# Patient Record
Sex: Male | Born: 1935
Health system: Southern US, Community
[De-identification: ages and names within clinical notes are randomized; demographics above are authoritative.]

## PROBLEM LIST (undated history)

## (undated) DIAGNOSIS — Z5111 Encounter for antineoplastic chemotherapy: Secondary | ICD-10-CM

## (undated) DIAGNOSIS — C61 Malignant neoplasm of prostate: Secondary | ICD-10-CM

## (undated) DIAGNOSIS — I639 Cerebral infarction, unspecified: Secondary | ICD-10-CM

## (undated) DIAGNOSIS — T7840XA Allergy, unspecified, initial encounter: Secondary | ICD-10-CM

## (undated) DIAGNOSIS — F419 Anxiety disorder, unspecified: Secondary | ICD-10-CM

## (undated) DIAGNOSIS — C4490 Unspecified malignant neoplasm of skin, unspecified: Secondary | ICD-10-CM

## (undated) DIAGNOSIS — C9000 Multiple myeloma not having achieved remission: Secondary | ICD-10-CM

## (undated) DIAGNOSIS — C419 Malignant neoplasm of bone and articular cartilage, unspecified: Secondary | ICD-10-CM

## (undated) DIAGNOSIS — H9190 Unspecified hearing loss, unspecified ear: Secondary | ICD-10-CM

## (undated) DIAGNOSIS — Z923 Personal history of irradiation: Secondary | ICD-10-CM

## (undated) DIAGNOSIS — C439 Malignant melanoma of skin, unspecified: Secondary | ICD-10-CM

## (undated) DIAGNOSIS — IMO0002 Reserved for concepts with insufficient information to code with codable children: Secondary | ICD-10-CM

## (undated) DIAGNOSIS — E079 Disorder of thyroid, unspecified: Secondary | ICD-10-CM

## (undated) HISTORY — PX: HERNIA REPAIR: SHX51

## (undated) HISTORY — DX: Encounter for antineoplastic chemotherapy: Z51.11

## (undated) HISTORY — DX: Cerebral infarction, unspecified: I63.9

## (undated) HISTORY — DX: Unspecified hearing loss, unspecified ear: H91.90

---

## 1992-08-04 DIAGNOSIS — C4490 Unspecified malignant neoplasm of skin, unspecified: Secondary | ICD-10-CM

## 1992-08-04 HISTORY — DX: Unspecified malignant neoplasm of skin, unspecified: C44.90

## 2000-08-04 DIAGNOSIS — C61 Malignant neoplasm of prostate: Secondary | ICD-10-CM

## 2000-08-04 HISTORY — DX: Malignant neoplasm of prostate: C61

## 2011-11-05 DIAGNOSIS — Z0000 Encounter for general adult medical examination without abnormal findings: Secondary | ICD-10-CM | POA: Diagnosis not present

## 2011-11-05 DIAGNOSIS — C61 Malignant neoplasm of prostate: Secondary | ICD-10-CM | POA: Diagnosis not present

## 2011-11-05 DIAGNOSIS — C439 Malignant melanoma of skin, unspecified: Secondary | ICD-10-CM | POA: Diagnosis not present

## 2011-11-05 DIAGNOSIS — E039 Hypothyroidism, unspecified: Secondary | ICD-10-CM | POA: Diagnosis not present

## 2011-11-12 DIAGNOSIS — E039 Hypothyroidism, unspecified: Secondary | ICD-10-CM | POA: Diagnosis not present

## 2011-11-12 DIAGNOSIS — D696 Thrombocytopenia, unspecified: Secondary | ICD-10-CM | POA: Diagnosis not present

## 2011-11-12 DIAGNOSIS — C61 Malignant neoplasm of prostate: Secondary | ICD-10-CM | POA: Diagnosis not present

## 2011-11-12 DIAGNOSIS — C439 Malignant melanoma of skin, unspecified: Secondary | ICD-10-CM | POA: Diagnosis not present

## 2011-11-27 DIAGNOSIS — L570 Actinic keratosis: Secondary | ICD-10-CM | POA: Diagnosis not present

## 2011-11-27 DIAGNOSIS — B079 Viral wart, unspecified: Secondary | ICD-10-CM | POA: Diagnosis not present

## 2012-01-05 DIAGNOSIS — L821 Other seborrheic keratosis: Secondary | ICD-10-CM | POA: Diagnosis not present

## 2012-01-05 DIAGNOSIS — L578 Other skin changes due to chronic exposure to nonionizing radiation: Secondary | ICD-10-CM | POA: Diagnosis not present

## 2012-01-05 DIAGNOSIS — L570 Actinic keratosis: Secondary | ICD-10-CM | POA: Diagnosis not present

## 2012-07-12 DIAGNOSIS — L719 Rosacea, unspecified: Secondary | ICD-10-CM | POA: Diagnosis not present

## 2012-07-12 DIAGNOSIS — L578 Other skin changes due to chronic exposure to nonionizing radiation: Secondary | ICD-10-CM | POA: Diagnosis not present

## 2012-07-12 DIAGNOSIS — L570 Actinic keratosis: Secondary | ICD-10-CM | POA: Diagnosis not present

## 2012-07-12 DIAGNOSIS — L219 Seborrheic dermatitis, unspecified: Secondary | ICD-10-CM | POA: Diagnosis not present

## 2012-07-14 DIAGNOSIS — Z8546 Personal history of malignant neoplasm of prostate: Secondary | ICD-10-CM | POA: Diagnosis not present

## 2012-07-14 DIAGNOSIS — R3129 Other microscopic hematuria: Secondary | ICD-10-CM | POA: Diagnosis not present

## 2012-07-14 DIAGNOSIS — C61 Malignant neoplasm of prostate: Secondary | ICD-10-CM | POA: Diagnosis not present

## 2012-11-02 DIAGNOSIS — H903 Sensorineural hearing loss, bilateral: Secondary | ICD-10-CM | POA: Diagnosis not present

## 2012-11-11 DIAGNOSIS — D696 Thrombocytopenia, unspecified: Secondary | ICD-10-CM | POA: Diagnosis not present

## 2012-11-11 DIAGNOSIS — C439 Malignant melanoma of skin, unspecified: Secondary | ICD-10-CM | POA: Diagnosis not present

## 2012-11-11 DIAGNOSIS — Z0000 Encounter for general adult medical examination without abnormal findings: Secondary | ICD-10-CM | POA: Diagnosis not present

## 2012-11-11 DIAGNOSIS — E039 Hypothyroidism, unspecified: Secondary | ICD-10-CM | POA: Diagnosis not present

## 2012-11-11 DIAGNOSIS — Z1331 Encounter for screening for depression: Secondary | ICD-10-CM | POA: Diagnosis not present

## 2012-11-11 DIAGNOSIS — C61 Malignant neoplasm of prostate: Secondary | ICD-10-CM | POA: Diagnosis not present

## 2012-12-30 DIAGNOSIS — H0230 Blepharochalasis unspecified eye, unspecified eyelid: Secondary | ICD-10-CM | POA: Diagnosis not present

## 2012-12-30 DIAGNOSIS — H04129 Dry eye syndrome of unspecified lacrimal gland: Secondary | ICD-10-CM | POA: Diagnosis not present

## 2012-12-30 DIAGNOSIS — H1045 Other chronic allergic conjunctivitis: Secondary | ICD-10-CM | POA: Diagnosis not present

## 2012-12-30 DIAGNOSIS — H2510 Age-related nuclear cataract, unspecified eye: Secondary | ICD-10-CM | POA: Diagnosis not present

## 2013-01-05 DIAGNOSIS — L578 Other skin changes due to chronic exposure to nonionizing radiation: Secondary | ICD-10-CM | POA: Diagnosis not present

## 2013-01-05 DIAGNOSIS — L723 Sebaceous cyst: Secondary | ICD-10-CM | POA: Diagnosis not present

## 2013-01-05 DIAGNOSIS — Z85828 Personal history of other malignant neoplasm of skin: Secondary | ICD-10-CM | POA: Diagnosis not present

## 2013-01-05 DIAGNOSIS — L219 Seborrheic dermatitis, unspecified: Secondary | ICD-10-CM | POA: Diagnosis not present

## 2013-01-05 DIAGNOSIS — L570 Actinic keratosis: Secondary | ICD-10-CM | POA: Diagnosis not present

## 2013-01-05 DIAGNOSIS — L719 Rosacea, unspecified: Secondary | ICD-10-CM | POA: Diagnosis not present

## 2013-01-05 DIAGNOSIS — L821 Other seborrheic keratosis: Secondary | ICD-10-CM | POA: Diagnosis not present

## 2013-01-05 DIAGNOSIS — L819 Disorder of pigmentation, unspecified: Secondary | ICD-10-CM | POA: Diagnosis not present

## 2013-03-23 DIAGNOSIS — M25569 Pain in unspecified knee: Secondary | ICD-10-CM | POA: Diagnosis not present

## 2013-03-23 DIAGNOSIS — M25519 Pain in unspecified shoulder: Secondary | ICD-10-CM | POA: Diagnosis not present

## 2013-06-15 DIAGNOSIS — Z23 Encounter for immunization: Secondary | ICD-10-CM | POA: Diagnosis not present

## 2013-07-14 DIAGNOSIS — N320 Bladder-neck obstruction: Secondary | ICD-10-CM | POA: Diagnosis not present

## 2013-07-14 DIAGNOSIS — Z8546 Personal history of malignant neoplasm of prostate: Secondary | ICD-10-CM | POA: Diagnosis not present

## 2013-07-14 DIAGNOSIS — R351 Nocturia: Secondary | ICD-10-CM | POA: Diagnosis not present

## 2013-08-12 DIAGNOSIS — M19019 Primary osteoarthritis, unspecified shoulder: Secondary | ICD-10-CM | POA: Diagnosis not present

## 2013-08-15 DIAGNOSIS — M25519 Pain in unspecified shoulder: Secondary | ICD-10-CM | POA: Diagnosis not present

## 2013-08-18 DIAGNOSIS — M25519 Pain in unspecified shoulder: Secondary | ICD-10-CM | POA: Diagnosis not present

## 2013-08-23 DIAGNOSIS — M25519 Pain in unspecified shoulder: Secondary | ICD-10-CM | POA: Diagnosis not present

## 2013-08-23 DIAGNOSIS — M19019 Primary osteoarthritis, unspecified shoulder: Secondary | ICD-10-CM | POA: Diagnosis not present

## 2013-08-25 DIAGNOSIS — M19019 Primary osteoarthritis, unspecified shoulder: Secondary | ICD-10-CM | POA: Diagnosis not present

## 2013-08-25 DIAGNOSIS — M25519 Pain in unspecified shoulder: Secondary | ICD-10-CM | POA: Diagnosis not present

## 2013-08-30 DIAGNOSIS — M19019 Primary osteoarthritis, unspecified shoulder: Secondary | ICD-10-CM | POA: Diagnosis not present

## 2013-08-30 DIAGNOSIS — M25519 Pain in unspecified shoulder: Secondary | ICD-10-CM | POA: Diagnosis not present

## 2013-09-01 DIAGNOSIS — M25519 Pain in unspecified shoulder: Secondary | ICD-10-CM | POA: Diagnosis not present

## 2013-10-05 DIAGNOSIS — M25519 Pain in unspecified shoulder: Secondary | ICD-10-CM | POA: Diagnosis not present

## 2013-10-05 DIAGNOSIS — M19019 Primary osteoarthritis, unspecified shoulder: Secondary | ICD-10-CM | POA: Diagnosis not present

## 2013-10-11 DIAGNOSIS — M25519 Pain in unspecified shoulder: Secondary | ICD-10-CM | POA: Diagnosis not present

## 2013-10-13 DIAGNOSIS — M25519 Pain in unspecified shoulder: Secondary | ICD-10-CM | POA: Diagnosis not present

## 2013-10-18 DIAGNOSIS — M25519 Pain in unspecified shoulder: Secondary | ICD-10-CM | POA: Diagnosis not present

## 2013-10-20 DIAGNOSIS — M25519 Pain in unspecified shoulder: Secondary | ICD-10-CM | POA: Diagnosis not present

## 2013-10-25 DIAGNOSIS — M25519 Pain in unspecified shoulder: Secondary | ICD-10-CM | POA: Diagnosis not present

## 2013-10-27 DIAGNOSIS — M25519 Pain in unspecified shoulder: Secondary | ICD-10-CM | POA: Diagnosis not present

## 2013-11-01 DIAGNOSIS — M25519 Pain in unspecified shoulder: Secondary | ICD-10-CM | POA: Diagnosis not present

## 2013-11-03 DIAGNOSIS — M25519 Pain in unspecified shoulder: Secondary | ICD-10-CM | POA: Diagnosis not present

## 2013-11-08 DIAGNOSIS — M19019 Primary osteoarthritis, unspecified shoulder: Secondary | ICD-10-CM | POA: Diagnosis not present

## 2013-11-08 DIAGNOSIS — M25519 Pain in unspecified shoulder: Secondary | ICD-10-CM | POA: Diagnosis not present

## 2013-11-14 DIAGNOSIS — Z0000 Encounter for general adult medical examination without abnormal findings: Secondary | ICD-10-CM | POA: Diagnosis not present

## 2013-11-14 DIAGNOSIS — M1990 Unspecified osteoarthritis, unspecified site: Secondary | ICD-10-CM | POA: Diagnosis not present

## 2013-11-14 DIAGNOSIS — D696 Thrombocytopenia, unspecified: Secondary | ICD-10-CM | POA: Diagnosis not present

## 2013-11-14 DIAGNOSIS — E039 Hypothyroidism, unspecified: Secondary | ICD-10-CM | POA: Diagnosis not present

## 2013-11-14 DIAGNOSIS — Z8546 Personal history of malignant neoplasm of prostate: Secondary | ICD-10-CM | POA: Diagnosis not present

## 2013-11-14 DIAGNOSIS — C439 Malignant melanoma of skin, unspecified: Secondary | ICD-10-CM | POA: Diagnosis not present

## 2013-11-14 DIAGNOSIS — Z1331 Encounter for screening for depression: Secondary | ICD-10-CM | POA: Diagnosis not present

## 2013-11-14 DIAGNOSIS — Z23 Encounter for immunization: Secondary | ICD-10-CM | POA: Diagnosis not present

## 2014-01-02 ENCOUNTER — Ambulatory Visit
Admission: RE | Admit: 2014-01-02 | Discharge: 2014-01-02 | Disposition: A | Discharge status: Home / Self Care | Payer: Medicare Other | Source: Ambulatory Visit | Attending: Internal Medicine | Admitting: Internal Medicine

## 2014-01-02 ENCOUNTER — Other Ambulatory Visit: Payer: Self-pay | Admitting: Internal Medicine

## 2014-01-02 DIAGNOSIS — R079 Chest pain, unspecified: Secondary | ICD-10-CM | POA: Diagnosis not present

## 2014-01-10 DIAGNOSIS — L570 Actinic keratosis: Secondary | ICD-10-CM | POA: Diagnosis not present

## 2014-01-10 DIAGNOSIS — L259 Unspecified contact dermatitis, unspecified cause: Secondary | ICD-10-CM | POA: Diagnosis not present

## 2014-01-10 DIAGNOSIS — T148 Other injury of unspecified body region: Secondary | ICD-10-CM | POA: Diagnosis not present

## 2014-01-10 DIAGNOSIS — Z85828 Personal history of other malignant neoplasm of skin: Secondary | ICD-10-CM | POA: Diagnosis not present

## 2014-01-10 DIAGNOSIS — W57XXXA Bitten or stung by nonvenomous insect and other nonvenomous arthropods, initial encounter: Secondary | ICD-10-CM | POA: Diagnosis not present

## 2014-01-10 DIAGNOSIS — L723 Sebaceous cyst: Secondary | ICD-10-CM | POA: Diagnosis not present

## 2014-01-10 DIAGNOSIS — L821 Other seborrheic keratosis: Secondary | ICD-10-CM | POA: Diagnosis not present

## 2014-01-27 DIAGNOSIS — H903 Sensorineural hearing loss, bilateral: Secondary | ICD-10-CM | POA: Diagnosis not present

## 2014-01-31 ENCOUNTER — Telehealth (HOSPITAL_COMMUNITY): Payer: Self-pay

## 2014-01-31 ENCOUNTER — Other Ambulatory Visit (HOSPITAL_COMMUNITY): Payer: Self-pay | Admitting: Internal Medicine

## 2014-01-31 DIAGNOSIS — R079 Chest pain, unspecified: Secondary | ICD-10-CM

## 2014-02-02 ENCOUNTER — Ambulatory Visit (HOSPITAL_COMMUNITY)
Admission: RE | Admit: 2014-02-02 | Discharge: 2014-02-02 | Disposition: A | Discharge status: Home / Self Care | Payer: Medicare Other | Source: Ambulatory Visit | Attending: Internal Medicine | Admitting: Internal Medicine

## 2014-02-02 DIAGNOSIS — R079 Chest pain, unspecified: Secondary | ICD-10-CM | POA: Diagnosis not present

## 2014-02-02 NOTE — Procedures (Signed)
Exercise Treadmill Test   Test  Exercise Tolerance Test Ordering MD: Wenda Low, MD  Interpreting MD:   Unique Test No: 1  Treadmill:  1  Indication for ETT: chest pain - rule out ischemia  Contraindication to ETT: Yes   Stress Modality: exercise - treadmill  Cardiac Imaging Performed: non   Protocol: standard Bruce - maximal  Max BP:  169/83  Max MPHR (bpm): 138 85% MPR (bpm):  117  MPHR obtained (bpm):  122 % MPHR obtained:  88  Reached 85% MPHR (min:sec):  6:08 Total Exercise Time (min-sec): 6:15  Workload in METS:  7.30 Borg Scale:   Reason ETT Terminated:  fatigue    ST Segment Analysis At Rest: normal ST segments - no evidence of significant ST depression With Exercise: mild <1 mm upsloping ST depression  Other Information Arrhythmia:  No Angina during ETT:  absent (0) Quality of ETT:  diagnostic  ETT Interpretation:  normal - no evidence of ischemia by ST analysis  Comments: Fair exercise tolerance given age Normal BP response to exercise Mild, upsloping ST depression inferiorly <67mm No chest pain  Pixie Casino, MD, [facility] Attending Cardiologist [place]

## 2014-02-09 DIAGNOSIS — K219 Gastro-esophageal reflux disease without esophagitis: Secondary | ICD-10-CM | POA: Diagnosis not present

## 2014-02-09 DIAGNOSIS — R0789 Other chest pain: Secondary | ICD-10-CM | POA: Diagnosis not present

## 2014-02-16 NOTE — Telephone Encounter (Signed)
Encounter complete. 

## 2014-03-15 DIAGNOSIS — T148 Other injury of unspecified body region: Secondary | ICD-10-CM | POA: Diagnosis not present

## 2014-03-15 DIAGNOSIS — Z85828 Personal history of other malignant neoplasm of skin: Secondary | ICD-10-CM | POA: Diagnosis not present

## 2014-03-15 DIAGNOSIS — L570 Actinic keratosis: Secondary | ICD-10-CM | POA: Diagnosis not present

## 2014-05-04 DIAGNOSIS — Z23 Encounter for immunization: Secondary | ICD-10-CM | POA: Diagnosis not present

## 2014-05-22 ENCOUNTER — Other Ambulatory Visit: Payer: Self-pay | Admitting: Internal Medicine

## 2014-05-22 DIAGNOSIS — K219 Gastro-esophageal reflux disease without esophagitis: Secondary | ICD-10-CM

## 2014-05-22 DIAGNOSIS — Z1389 Encounter for screening for other disorder: Secondary | ICD-10-CM | POA: Diagnosis not present

## 2014-05-23 ENCOUNTER — Other Ambulatory Visit: Payer: Self-pay | Admitting: Internal Medicine

## 2014-05-23 ENCOUNTER — Ambulatory Visit
Admission: RE | Admit: 2014-05-23 | Discharge: 2014-05-23 | Disposition: A | Discharge status: Home / Self Care | Payer: Medicare Other | Source: Ambulatory Visit | Attending: Internal Medicine | Admitting: Internal Medicine

## 2014-05-23 DIAGNOSIS — K219 Gastro-esophageal reflux disease without esophagitis: Secondary | ICD-10-CM

## 2014-05-23 DIAGNOSIS — K449 Diaphragmatic hernia without obstruction or gangrene: Secondary | ICD-10-CM | POA: Diagnosis not present

## 2014-05-23 DIAGNOSIS — K5710 Diverticulosis of small intestine without perforation or abscess without bleeding: Secondary | ICD-10-CM | POA: Diagnosis not present

## 2014-06-03 DIAGNOSIS — M546 Pain in thoracic spine: Secondary | ICD-10-CM | POA: Diagnosis not present

## 2014-06-08 DIAGNOSIS — M546 Pain in thoracic spine: Secondary | ICD-10-CM | POA: Diagnosis not present

## 2014-06-13 DIAGNOSIS — M546 Pain in thoracic spine: Secondary | ICD-10-CM | POA: Diagnosis not present

## 2014-06-14 ENCOUNTER — Telehealth: Payer: Self-pay | Admitting: Internal Medicine

## 2014-06-14 NOTE — Telephone Encounter (Signed)
S/W PATIENT AND GAVE NP APPT FOR 11/12 @ 11 W/DR. MOHAMED.  REFERRING DR. MARK DUMONSKI DX- METS CANCER

## 2014-06-15 ENCOUNTER — Telehealth: Payer: Self-pay | Admitting: Internal Medicine

## 2014-06-15 ENCOUNTER — Ambulatory Visit (HOSPITAL_BASED_OUTPATIENT_CLINIC_OR_DEPARTMENT_OTHER): Payer: Medicare Other | Admitting: Internal Medicine

## 2014-06-15 ENCOUNTER — Other Ambulatory Visit: Payer: Self-pay | Admitting: *Deleted

## 2014-06-15 ENCOUNTER — Ambulatory Visit: Payer: Medicare Other

## 2014-06-15 ENCOUNTER — Other Ambulatory Visit (HOSPITAL_BASED_OUTPATIENT_CLINIC_OR_DEPARTMENT_OTHER): Payer: Medicare Other

## 2014-06-15 ENCOUNTER — Encounter: Payer: Self-pay | Admitting: Internal Medicine

## 2014-06-15 VITALS — BP 126/61 | HR 77 | Temp 98.3°F | Resp 19 | Ht 69.0 in | Wt 165.0 lb

## 2014-06-15 DIAGNOSIS — C419 Malignant neoplasm of bone and articular cartilage, unspecified: Secondary | ICD-10-CM

## 2014-06-15 DIAGNOSIS — Z85820 Personal history of malignant melanoma of skin: Secondary | ICD-10-CM

## 2014-06-15 DIAGNOSIS — C801 Malignant (primary) neoplasm, unspecified: Secondary | ICD-10-CM

## 2014-06-15 DIAGNOSIS — Z8546 Personal history of malignant neoplasm of prostate: Secondary | ICD-10-CM

## 2014-06-15 DIAGNOSIS — M899 Disorder of bone, unspecified: Secondary | ICD-10-CM | POA: Diagnosis not present

## 2014-06-15 DIAGNOSIS — C7951 Secondary malignant neoplasm of bone: Secondary | ICD-10-CM | POA: Diagnosis not present

## 2014-06-15 LAB — CBC WITH DIFFERENTIAL/PLATELET
BASO%: 0.3 % (ref 0.0–2.0)
BASOS ABS: 0 10*3/uL (ref 0.0–0.1)
EOS ABS: 0.1 10*3/uL (ref 0.0–0.5)
EOS%: 2 % (ref 0.0–7.0)
HCT: 38.7 % (ref 38.4–49.9)
HGB: 12.7 g/dL — ABNORMAL LOW (ref 13.0–17.1)
LYMPH#: 1.1 10*3/uL (ref 0.9–3.3)
LYMPH%: 20.8 % (ref 14.0–49.0)
MCH: 32.2 pg (ref 27.2–33.4)
MCHC: 32.8 g/dL (ref 32.0–36.0)
MCV: 98.2 fL — ABNORMAL HIGH (ref 79.3–98.0)
MONO#: 0.5 10*3/uL (ref 0.1–0.9)
MONO%: 9.6 % (ref 0.0–14.0)
NEUT#: 3.4 10*3/uL (ref 1.5–6.5)
NEUT%: 67.3 % (ref 39.0–75.0)
Platelets: 156 10*3/uL (ref 140–400)
RBC: 3.94 10*6/uL — ABNORMAL LOW (ref 4.20–5.82)
RDW: 13.2 % (ref 11.0–14.6)
WBC: 5.1 10*3/uL (ref 4.0–10.3)

## 2014-06-15 LAB — COMPREHENSIVE METABOLIC PANEL (CC13)
ALBUMIN: 3.7 g/dL (ref 3.5–5.0)
ALT: 15 U/L (ref 0–55)
ANION GAP: 3 meq/L (ref 3–11)
AST: 25 U/L (ref 5–34)
Alkaline Phosphatase: 64 U/L (ref 40–150)
BUN: 23.9 mg/dL (ref 7.0–26.0)
CALCIUM: 9.3 mg/dL (ref 8.4–10.4)
CO2: 28 meq/L (ref 22–29)
Chloride: 103 mEq/L (ref 98–109)
Creatinine: 1.1 mg/dL (ref 0.7–1.3)
Glucose: 107 mg/dl (ref 70–140)
POTASSIUM: 4.6 meq/L (ref 3.5–5.1)
Sodium: 134 mEq/L — ABNORMAL LOW (ref 136–145)
Total Bilirubin: 0.43 mg/dL (ref 0.20–1.20)
Total Protein: 8.2 g/dL (ref 6.4–8.3)

## 2014-06-15 LAB — LACTATE DEHYDROGENASE (CC13): LDH: 197 U/L (ref 125–245)

## 2014-06-15 NOTE — Progress Notes (Signed)
Checked in new patient with no issues. He has not traveled and has appt card.

## 2014-06-15 NOTE — Telephone Encounter (Signed)
gv and printed appt sched and avs for pt for NOV °

## 2014-06-16 LAB — PSA: PSA: <0.01 ng/mL (ref <=4.00)

## 2014-06-17 ENCOUNTER — Emergency Department (HOSPITAL_COMMUNITY): Payer: Medicare Other

## 2014-06-17 ENCOUNTER — Encounter (HOSPITAL_COMMUNITY)
Admission: EM | Disposition: A | Discharge status: Other Acute Inpatient Hospital | Payer: Self-pay | Source: Home / Self Care | Attending: Neurosurgery

## 2014-06-17 ENCOUNTER — Encounter: Payer: Self-pay | Admitting: Internal Medicine

## 2014-06-17 ENCOUNTER — Inpatient Hospital Stay (HOSPITAL_COMMUNITY): Payer: Medicare Other | Admitting: Anesthesiology

## 2014-06-17 ENCOUNTER — Inpatient Hospital Stay (HOSPITAL_COMMUNITY)
Admission: EM | Admit: 2014-06-17 | Discharge: 2014-06-20 | DRG: 457 | Disposition: A | Discharge status: Rehabilitation Facility | Payer: Medicare Other | Attending: Neurosurgery | Admitting: Neurosurgery

## 2014-06-17 ENCOUNTER — Encounter (HOSPITAL_COMMUNITY): Payer: Self-pay | Admitting: Emergency Medicine

## 2014-06-17 DIAGNOSIS — G9520 Unspecified cord compression: Secondary | ICD-10-CM | POA: Diagnosis not present

## 2014-06-17 DIAGNOSIS — G9529 Other cord compression: Secondary | ICD-10-CM | POA: Diagnosis present

## 2014-06-17 DIAGNOSIS — Z85820 Personal history of malignant melanoma of skin: Secondary | ICD-10-CM | POA: Diagnosis not present

## 2014-06-17 DIAGNOSIS — M549 Dorsalgia, unspecified: Secondary | ICD-10-CM | POA: Diagnosis present

## 2014-06-17 DIAGNOSIS — C7931 Secondary malignant neoplasm of brain: Secondary | ICD-10-CM | POA: Diagnosis not present

## 2014-06-17 DIAGNOSIS — R0602 Shortness of breath: Secondary | ICD-10-CM | POA: Diagnosis not present

## 2014-06-17 DIAGNOSIS — C61 Malignant neoplasm of prostate: Secondary | ICD-10-CM | POA: Diagnosis not present

## 2014-06-17 DIAGNOSIS — D62 Acute posthemorrhagic anemia: Secondary | ICD-10-CM

## 2014-06-17 DIAGNOSIS — M899 Disorder of bone, unspecified: Secondary | ICD-10-CM | POA: Diagnosis not present

## 2014-06-17 DIAGNOSIS — Z8546 Personal history of malignant neoplasm of prostate: Secondary | ICD-10-CM | POA: Diagnosis not present

## 2014-06-17 DIAGNOSIS — C7949 Secondary malignant neoplasm of other parts of nervous system: Secondary | ICD-10-CM | POA: Diagnosis not present

## 2014-06-17 DIAGNOSIS — R079 Chest pain, unspecified: Secondary | ICD-10-CM | POA: Diagnosis present

## 2014-06-17 DIAGNOSIS — D492 Neoplasm of unspecified behavior of bone, soft tissue, and skin: Secondary | ICD-10-CM | POA: Diagnosis not present

## 2014-06-17 DIAGNOSIS — C7951 Secondary malignant neoplasm of bone: Principal | ICD-10-CM

## 2014-06-17 DIAGNOSIS — J9811 Atelectasis: Secondary | ICD-10-CM | POA: Diagnosis not present

## 2014-06-17 DIAGNOSIS — R0789 Other chest pain: Secondary | ICD-10-CM | POA: Diagnosis not present

## 2014-06-17 DIAGNOSIS — C801 Malignant (primary) neoplasm, unspecified: Secondary | ICD-10-CM | POA: Diagnosis not present

## 2014-06-17 DIAGNOSIS — Z419 Encounter for procedure for purposes other than remedying health state, unspecified: Secondary | ICD-10-CM

## 2014-06-17 DIAGNOSIS — G8220 Paraplegia, unspecified: Secondary | ICD-10-CM

## 2014-06-17 DIAGNOSIS — C412 Malignant neoplasm of vertebral column: Secondary | ICD-10-CM | POA: Diagnosis not present

## 2014-06-17 DIAGNOSIS — M8448XA Pathological fracture, other site, initial encounter for fracture: Secondary | ICD-10-CM | POA: Diagnosis not present

## 2014-06-17 DIAGNOSIS — M47894 Other spondylosis, thoracic region: Secondary | ICD-10-CM | POA: Diagnosis not present

## 2014-06-17 HISTORY — DX: Malignant neoplasm of prostate: C61

## 2014-06-17 HISTORY — DX: Reserved for concepts with insufficient information to code with codable children: IMO0002

## 2014-06-17 HISTORY — DX: Malignant melanoma of skin, unspecified: C43.9

## 2014-06-17 HISTORY — PX: POSTERIOR CERVICAL FUSION/FORAMINOTOMY: SHX5038

## 2014-06-17 LAB — MRSA PCR SCREENING: MRSA by PCR: NEGATIVE

## 2014-06-17 LAB — CBC WITH DIFFERENTIAL/PLATELET
BASOS ABS: 0 10*3/uL (ref 0.0–0.1)
BASOS PCT: 0 % (ref 0–1)
EOS PCT: 1 % (ref 0–5)
Eosinophils Absolute: 0.1 10*3/uL (ref 0.0–0.7)
HEMATOCRIT: 40.5 % (ref 39.0–52.0)
Hemoglobin: 13.9 g/dL (ref 13.0–17.0)
Lymphocytes Relative: 24 % (ref 12–46)
Lymphs Abs: 1.4 10*3/uL (ref 0.7–4.0)
MCH: 33.1 pg (ref 26.0–34.0)
MCHC: 34.3 g/dL (ref 30.0–36.0)
MCV: 96.4 fL (ref 78.0–100.0)
MONO ABS: 0.5 10*3/uL (ref 0.1–1.0)
Monocytes Relative: 8 % (ref 3–12)
NEUTROS ABS: 3.8 10*3/uL (ref 1.7–7.7)
Neutrophils Relative %: 67 % (ref 43–77)
Platelets: 167 10*3/uL (ref 150–400)
RBC: 4.2 MIL/uL — ABNORMAL LOW (ref 4.22–5.81)
RDW: 13.4 % (ref 11.5–15.5)
WBC: 5.8 10*3/uL (ref 4.0–10.5)

## 2014-06-17 LAB — CBC
HCT: 38.1 % — ABNORMAL LOW (ref 39.0–52.0)
Hemoglobin: 13.1 g/dL (ref 13.0–17.0)
MCH: 33.1 pg (ref 26.0–34.0)
MCHC: 34.4 g/dL (ref 30.0–36.0)
MCV: 96.2 fL (ref 78.0–100.0)
PLATELETS: 148 10*3/uL — AB (ref 150–400)
RBC: 3.96 MIL/uL — ABNORMAL LOW (ref 4.22–5.81)
RDW: 13.2 % (ref 11.5–15.5)
WBC: 4.8 10*3/uL (ref 4.0–10.5)

## 2014-06-17 LAB — TROPONIN I

## 2014-06-17 LAB — BASIC METABOLIC PANEL
ANION GAP: 10 (ref 5–15)
BUN: 18 mg/dL (ref 6–23)
CO2: 25 mEq/L (ref 19–32)
Calcium: 9.2 mg/dL (ref 8.4–10.5)
Chloride: 96 mEq/L (ref 96–112)
Creatinine, Ser: 0.84 mg/dL (ref 0.50–1.35)
GFR calc Af Amer: >90 mL/min (ref >90)
GFR, EST NON AFRICAN AMERICAN: 79 mL/min — AB (ref >90)
Glucose, Bld: 122 mg/dL — ABNORMAL HIGH (ref 70–99)
POTASSIUM: 4 meq/L (ref 3.7–5.3)
SODIUM: 131 meq/L — AB (ref 137–147)

## 2014-06-17 LAB — COMPREHENSIVE METABOLIC PANEL
ALBUMIN: 4 g/dL (ref 3.5–5.2)
ALT: 23 U/L (ref 0–53)
AST: 36 U/L (ref 0–37)
Alkaline Phosphatase: 73 U/L (ref 39–117)
Anion gap: 14 (ref 5–15)
BUN: 22 mg/dL (ref 6–23)
CALCIUM: 9.8 mg/dL (ref 8.4–10.5)
CO2: 25 mEq/L (ref 19–32)
CREATININE: 1.01 mg/dL (ref 0.50–1.35)
Chloride: 100 mEq/L (ref 96–112)
GFR calc Af Amer: 78 mL/min — ABNORMAL LOW (ref >90)
GFR calc non Af Amer: 67 mL/min — ABNORMAL LOW (ref >90)
Glucose, Bld: 113 mg/dL — ABNORMAL HIGH (ref 70–99)
Potassium: 4.8 mEq/L (ref 3.7–5.3)
Sodium: 139 mEq/L (ref 137–147)
Total Bilirubin: 0.4 mg/dL (ref 0.3–1.2)
Total Protein: 9.5 g/dL — ABNORMAL HIGH (ref 6.0–8.3)

## 2014-06-17 LAB — PROTIME-INR
INR: 1.15 (ref 0.00–1.49)
PROTHROMBIN TIME: 14.8 s (ref 11.6–15.2)

## 2014-06-17 LAB — APTT: aPTT: 32 seconds (ref 24–37)

## 2014-06-17 LAB — D-DIMER, QUANTITATIVE (NOT AT ARMC): D DIMER QUANT: 2.74 ug{FEU}/mL — AB (ref 0.00–0.48)

## 2014-06-17 SURGERY — POSTERIOR CERVICAL FUSION/FORAMINOTOMY LEVEL 4
Anesthesia: General

## 2014-06-17 SURGERY — POSTERIOR CERVICAL FUSION/FORAMINOTOMY LEVEL 3
Anesthesia: General | Site: Back

## 2014-06-17 MED ORDER — ONDANSETRON HCL 4 MG PO TABS: 4.0000 mg | ORAL_TABLET | Freq: Four times a day (QID) | ORAL | Status: DC | PRN

## 2014-06-17 MED ORDER — LIDOCAINE HCL (CARDIAC) 20 MG/ML IV SOLN
INTRAVENOUS | Status: AC
  Filled 2014-06-17: qty 5

## 2014-06-17 MED ORDER — LIDOCAINE HCL (CARDIAC) 20 MG/ML IV SOLN
INTRAVENOUS | Status: DC | PRN
  Administered 2014-06-17: 70 mg via INTRAVENOUS

## 2014-06-17 MED ORDER — SODIUM CHLORIDE 0.9 % IV SOLN
INTRAVENOUS | Status: DC | PRN
  Administered 2014-06-17: 10 mL via INTRAMUSCULAR
  Administered 2014-06-18: 8 mL via INTRAMUSCULAR

## 2014-06-17 MED ORDER — PROPOFOL 10 MG/ML IV BOLUS
INTRAVENOUS | Status: DC | PRN
  Administered 2014-06-17: 100 mg via INTRAVENOUS

## 2014-06-17 MED ORDER — THROMBIN 5000 UNITS EX SOLR
OROMUCOSAL | Status: DC | PRN
  Administered 2014-06-17 – 2014-06-18 (×2): 5 mL via TOPICAL

## 2014-06-17 MED ORDER — ACETAMINOPHEN 650 MG RE SUPP: 650.0000 mg | Freq: Four times a day (QID) | RECTAL | Status: DC | PRN

## 2014-06-17 MED ORDER — MORPHINE SULFATE 4 MG/ML IJ SOLN
6.0000 mg | Freq: Once | INTRAMUSCULAR | Status: AC
  Administered 2014-06-17: 6 mg via INTRAVENOUS
  Filled 2014-06-17: qty 2

## 2014-06-17 MED ORDER — METRONIDAZOLE 0.75 % EX CREA
TOPICAL_CREAM | CUTANEOUS | Status: DC | PRN
  Filled 2014-06-17: qty 45

## 2014-06-17 MED ORDER — LACTATED RINGERS IV SOLN
INTRAVENOUS | Status: DC | PRN
  Administered 2014-06-17 – 2014-06-18 (×2): via INTRAVENOUS

## 2014-06-17 MED ORDER — DEXAMETHASONE 4 MG PO TABS: 6.0000 mg | ORAL_TABLET | Freq: Four times a day (QID) | ORAL | Status: DC

## 2014-06-17 MED ORDER — NEOSTIGMINE METHYLSULFATE 10 MG/10ML IV SOLN
INTRAVENOUS | Status: AC
  Filled 2014-06-17: qty 1

## 2014-06-17 MED ORDER — FENTANYL CITRATE 0.05 MG/ML IJ SOLN
INTRAMUSCULAR | Status: AC
  Filled 2014-06-17: qty 5

## 2014-06-17 MED ORDER — FAMOTIDINE 20 MG PO TABS
20.0000 mg | ORAL_TABLET | Freq: Every day | ORAL | Status: DC
  Administered 2014-06-18 – 2014-06-19 (×2): 20 mg via ORAL
  Filled 2014-06-17 (×4): qty 1

## 2014-06-17 MED ORDER — ACETAMINOPHEN 325 MG PO TABS: 650.0000 mg | ORAL_TABLET | Freq: Four times a day (QID) | ORAL | Status: DC | PRN

## 2014-06-17 MED ORDER — ARTIFICIAL TEARS OP OINT
TOPICAL_OINTMENT | OPHTHALMIC | Status: AC
  Filled 2014-06-17: qty 3.5

## 2014-06-17 MED ORDER — VITAMIN D3 25 MCG (1000 UNIT) PO TABS
1000.0000 [IU] | ORAL_TABLET | Freq: Every day | ORAL | Status: DC
  Administered 2014-06-18 – 2014-06-19 (×2): 1000 [IU] via ORAL
  Filled 2014-06-17 (×4): qty 1

## 2014-06-17 MED ORDER — DEXAMETHASONE SODIUM PHOSPHATE 10 MG/ML IJ SOLN
10.0000 mg | Freq: Once | INTRAMUSCULAR | Status: AC
  Administered 2014-06-17: 10 mg via INTRAVENOUS
  Filled 2014-06-17: qty 1

## 2014-06-17 MED ORDER — POLYETHYLENE GLYCOL 3350 17 G PO PACK
17.0000 g | PACK | Freq: Every day | ORAL | Status: DC | PRN
  Filled 2014-06-17: qty 1

## 2014-06-17 MED ORDER — OXYCODONE HCL 5 MG PO TABS
5.0000 mg | ORAL_TABLET | ORAL | Status: DC | PRN
Start: 2014-06-17 — End: 2014-06-18

## 2014-06-17 MED ORDER — PHENYLEPHRINE HCL 10 MG/ML IJ SOLN
10.0000 mg | INTRAVENOUS | Status: DC | PRN
  Administered 2014-06-17: 10 ug/min via INTRAVENOUS

## 2014-06-17 MED ORDER — PANTOPRAZOLE SODIUM 40 MG PO TBEC
80.0000 mg | DELAYED_RELEASE_TABLET | Freq: Every day | ORAL | Status: DC
  Administered 2014-06-18 – 2014-06-20 (×3): 80 mg via ORAL
  Filled 2014-06-17 (×3): qty 2

## 2014-06-17 MED ORDER — ONDANSETRON HCL 4 MG/2ML IJ SOLN
4.0000 mg | Freq: Four times a day (QID) | INTRAMUSCULAR | Status: DC | PRN
Start: 2014-06-17 — End: 2014-06-18

## 2014-06-17 MED ORDER — SODIUM CHLORIDE 0.9 % IJ SOLN
3.0000 mL | Freq: Two times a day (BID) | INTRAMUSCULAR | Status: DC
Start: 2014-06-17 — End: 2014-06-18

## 2014-06-17 MED ORDER — ONDANSETRON HCL 4 MG/2ML IJ SOLN
INTRAMUSCULAR | Status: AC
  Filled 2014-06-17: qty 2

## 2014-06-17 MED ORDER — IOHEXOL 350 MG/ML SOLN
100.0000 mL | Freq: Once | INTRAVENOUS | Status: AC | PRN
  Administered 2014-06-17: 100 mL via INTRAVENOUS

## 2014-06-17 MED ORDER — STERILE WATER FOR INJECTION IJ SOLN
INTRAMUSCULAR | Status: AC
  Filled 2014-06-17: qty 10

## 2014-06-17 MED ORDER — EPHEDRINE SULFATE 50 MG/ML IJ SOLN
INTRAMUSCULAR | Status: AC
  Filled 2014-06-17: qty 1

## 2014-06-17 MED ORDER — FENTANYL CITRATE 0.05 MG/ML IJ SOLN
INTRAMUSCULAR | Status: DC | PRN
  Administered 2014-06-17 (×3): 50 ug via INTRAVENOUS
  Administered 2014-06-17: 100 ug via INTRAVENOUS
  Administered 2014-06-17 (×2): 50 ug via INTRAVENOUS

## 2014-06-17 MED ORDER — HYDROMORPHONE HCL 1 MG/ML IJ SOLN
1.0000 mg | INTRAMUSCULAR | Status: DC | PRN
Start: 2014-06-17 — End: 2014-06-18

## 2014-06-17 MED ORDER — TAMSULOSIN HCL 0.4 MG PO CAPS
0.4000 mg | ORAL_CAPSULE | Freq: Every day | ORAL | Status: DC
  Administered 2014-06-18 – 2014-06-19 (×2): 0.4 mg via ORAL
  Filled 2014-06-17 (×4): qty 1

## 2014-06-17 MED ORDER — SENNA 8.6 MG PO TABS
1.0000 | ORAL_TABLET | Freq: Two times a day (BID) | ORAL | Status: DC
  Filled 2014-06-17 (×3): qty 1

## 2014-06-17 MED ORDER — METRONIDAZOLE 0.75 % EX CREA: 1.0000 "application " | TOPICAL_CREAM | CUTANEOUS | Status: DC

## 2014-06-17 MED ORDER — SODIUM CHLORIDE 0.9 % IV SOLN: 250.0000 mL | INTRAVENOUS | Status: DC | PRN

## 2014-06-17 MED ORDER — HYDROCORTISONE 2.5 % EX SOLN: 1.0000 "application " | Freq: Every day | CUTANEOUS | Status: DC | PRN

## 2014-06-17 MED ORDER — CARBOXYMETHYLCELLULOSE SODIUM 1 % OP SOLN: 1.0000 [drp] | OPHTHALMIC | Status: DC | PRN

## 2014-06-17 MED ORDER — ROCURONIUM BROMIDE 100 MG/10ML IV SOLN
INTRAVENOUS | Status: DC | PRN
  Administered 2014-06-17: 20 mg via INTRAVENOUS
  Administered 2014-06-17: 50 mg via INTRAVENOUS

## 2014-06-17 MED ORDER — POLYVINYL ALCOHOL 1.4 % OP SOLN
1.0000 [drp] | OPHTHALMIC | Status: DC | PRN
  Filled 2014-06-17: qty 15

## 2014-06-17 MED ORDER — THROMBIN 20000 UNITS EX SOLR
CUTANEOUS | Status: DC | PRN
  Administered 2014-06-17: 20 mL via TOPICAL

## 2014-06-17 MED ORDER — SODIUM CHLORIDE 0.9 % IV SOLN: Freq: Once | INTRAVENOUS | Status: DC

## 2014-06-17 MED ORDER — BISACODYL 10 MG RE SUPP: 10.0000 mg | Freq: Every day | RECTAL | Status: DC | PRN

## 2014-06-17 MED ORDER — CEFAZOLIN SODIUM-DEXTROSE 2-3 GM-% IV SOLR
INTRAVENOUS | Status: DC | PRN
  Administered 2014-06-17: 2 g via INTRAVENOUS

## 2014-06-17 MED ORDER — 0.9 % SODIUM CHLORIDE (POUR BTL) OPTIME
TOPICAL | Status: DC | PRN
  Administered 2014-06-17: 1000 mL

## 2014-06-17 MED ORDER — GLYCOPYRROLATE 0.2 MG/ML IJ SOLN
INTRAMUSCULAR | Status: AC
  Filled 2014-06-17: qty 4

## 2014-06-17 MED ORDER — LACTATED RINGERS IV SOLN
INTRAVENOUS | Status: DC | PRN
  Administered 2014-06-17: 22:00:00 via INTRAVENOUS

## 2014-06-17 MED ORDER — HYDROCORTISONE 1 % EX CREA
TOPICAL_CREAM | Freq: Every day | CUTANEOUS | Status: DC | PRN
  Filled 2014-06-17: qty 28

## 2014-06-17 MED ORDER — CEFAZOLIN SODIUM-DEXTROSE 2-3 GM-% IV SOLR
INTRAVENOUS | Status: AC
  Filled 2014-06-17: qty 50

## 2014-06-17 MED ORDER — PROPOFOL 10 MG/ML IV BOLUS
INTRAVENOUS | Status: AC
  Filled 2014-06-17: qty 20

## 2014-06-17 MED ORDER — SODIUM CHLORIDE 0.9 % IJ SOLN: 3.0000 mL | INTRAMUSCULAR | Status: DC | PRN

## 2014-06-17 MED ORDER — LEVOTHYROXINE SODIUM 25 MCG PO TABS
25.0000 ug | ORAL_TABLET | Freq: Every day | ORAL | Status: DC
  Administered 2014-06-18 – 2014-06-20 (×3): 25 ug via ORAL
  Filled 2014-06-17 (×5): qty 1

## 2014-06-17 MED ORDER — MAGNESIUM CITRATE PO SOLN: 1.0000 | Freq: Once | ORAL | Status: AC | PRN

## 2014-06-17 MED ORDER — POTASSIUM CHLORIDE IN NACL 20-0.9 MEQ/L-% IV SOLN
INTRAVENOUS | Status: DC
  Administered 2014-06-18: 06:00:00 via INTRAVENOUS
  Administered 2014-06-18: 80 mL/h via INTRAVENOUS
  Administered 2014-06-19 – 2014-06-20 (×2): via INTRAVENOUS
  Filled 2014-06-17 (×9): qty 1000

## 2014-06-17 MED ORDER — SUCCINYLCHOLINE CHLORIDE 20 MG/ML IJ SOLN
INTRAMUSCULAR | Status: AC
  Filled 2014-06-17: qty 1

## 2014-06-17 MED ORDER — ROCURONIUM BROMIDE 50 MG/5ML IV SOLN
INTRAVENOUS | Status: AC
  Filled 2014-06-17: qty 1

## 2014-06-17 SURGICAL SUPPLY — 66 items
BAG DECANTER FOR FLEXI CONT (MISCELLANEOUS) ×3 IMPLANT
BENZOIN TINCTURE PRP APPL 2/3 (GAUZE/BANDAGES/DRESSINGS) ×6 IMPLANT
BLADE CLIPPER SURG (BLADE) ×3 IMPLANT
BLADE ULTRA TIP 2M (BLADE) IMPLANT
BUR MATCHSTICK NEURO 3.0 LAGG (BURR) ×3 IMPLANT
BUR PRECISION FLUTE 5.0 (BURR) ×3 IMPLANT
CANISTER SUCT 3000ML (MISCELLANEOUS) ×3 IMPLANT
CAP LOCKING THREADED (Cap) ×15 IMPLANT
CLOSURE WOUND 1/2 X4 (GAUZE/BANDAGES/DRESSINGS)
CONNECTOR CROSSOVER 29-30MM (Connector) ×3 IMPLANT
CONT SPEC 4OZ CLIKSEAL STRL BL (MISCELLANEOUS) ×3 IMPLANT
DECANTER SPIKE VIAL GLASS SM (MISCELLANEOUS) ×3 IMPLANT
DRAPE C-ARM 42X72 X-RAY (DRAPES) ×6 IMPLANT
DRAPE C-ARMOR (DRAPES) ×3 IMPLANT
DRAPE LAPAROTOMY 100X72 PEDS (DRAPES) ×3 IMPLANT
DRAPE POUCH INSTRU U-SHP 10X18 (DRAPES) ×3 IMPLANT
DRSG OPSITE POSTOP 4X10 (GAUZE/BANDAGES/DRESSINGS) ×3 IMPLANT
DRSG TELFA 3X8 NADH (GAUZE/BANDAGES/DRESSINGS) IMPLANT
DURAPREP 6ML APPLICATOR 50/CS (WOUND CARE) ×3 IMPLANT
DURASEAL APPLICATOR TIP (TIP) ×3 IMPLANT
DURASEAL SPINE SEALANT 3ML (MISCELLANEOUS) ×3 IMPLANT
ELECT REM PT RETURN 9FT ADLT (ELECTROSURGICAL) ×3
ELECTRODE REM PT RTRN 9FT ADLT (ELECTROSURGICAL) ×1 IMPLANT
GAUZE SPONGE 4X4 12PLY STRL (GAUZE/BANDAGES/DRESSINGS) IMPLANT
GAUZE SPONGE 4X4 16PLY XRAY LF (GAUZE/BANDAGES/DRESSINGS) IMPLANT
GLOVE BIO SURGEON STRL SZ 6.5 (GLOVE) ×4 IMPLANT
GLOVE BIO SURGEON STRL SZ7 (GLOVE) ×9 IMPLANT
GLOVE BIO SURGEONS STRL SZ 6.5 (GLOVE) ×2
GLOVE ECLIPSE 6.5 STRL STRAW (GLOVE) ×6 IMPLANT
GLOVE EXAM NITRILE LRG STRL (GLOVE) IMPLANT
GLOVE EXAM NITRILE MD LF STRL (GLOVE) IMPLANT
GLOVE EXAM NITRILE XL STR (GLOVE) IMPLANT
GLOVE EXAM NITRILE XS STR PU (GLOVE) IMPLANT
GOWN STRL REUS W/ TWL LRG LVL3 (GOWN DISPOSABLE) ×3 IMPLANT
GOWN STRL REUS W/ TWL XL LVL3 (GOWN DISPOSABLE) IMPLANT
GOWN STRL REUS W/TWL 2XL LVL3 (GOWN DISPOSABLE) IMPLANT
GOWN STRL REUS W/TWL LRG LVL3 (GOWN DISPOSABLE) ×6
GOWN STRL REUS W/TWL XL LVL3 (GOWN DISPOSABLE)
KIT BASIN OR (CUSTOM PROCEDURE TRAY) ×3 IMPLANT
KIT ROOM TURNOVER OR (KITS) ×3 IMPLANT
NEEDLE HYPO 25X1 1.5 SAFETY (NEEDLE) ×3 IMPLANT
NS IRRIG 1000ML POUR BTL (IV SOLUTION) ×3 IMPLANT
PACK LAMINECTOMY NEURO (CUSTOM PROCEDURE TRAY) ×3 IMPLANT
PAD ARMBOARD 7.5X6 YLW CONV (MISCELLANEOUS) ×9 IMPLANT
PIN MAYFIELD SKULL DISP (PIN) ×3 IMPLANT
ROD 100MM (Rod) ×2 IMPLANT
ROD 85MM (Rod) ×3 IMPLANT
ROD SPNL STRG 100X5.5XNS (Rod) ×1 IMPLANT
SCREW 4.0X35MM (Screw) ×6 IMPLANT
SCREW 4.0X40MM (Screw) ×3 IMPLANT
SCREW CREO 4.5X40MM (Screw) ×6 IMPLANT
SPONGE LAP 4X18 X RAY DECT (DISPOSABLE) IMPLANT
SPONGE SURGIFOAM ABS GEL 100 (HEMOSTASIS) ×3 IMPLANT
STAPLER SKIN PROX WIDE 3.9 (STAPLE) ×3 IMPLANT
STRIP CLOSURE SKIN 1/2X4 (GAUZE/BANDAGES/DRESSINGS) IMPLANT
STRIP VITOSS 25X100X4MM (Neuro Prosthesis/Implant) ×3 IMPLANT
SUT ETHILON 3 0 FSL (SUTURE) ×3 IMPLANT
SUT VIC AB 0 CT1 18XCR BRD8 (SUTURE) ×4 IMPLANT
SUT VIC AB 0 CT1 8-18 (SUTURE) ×8
SUT VIC AB 2-0 CT1 18 (SUTURE) ×6 IMPLANT
SUT VIC AB 3-0 SH 8-18 (SUTURE) ×6 IMPLANT
SYR 20ML ECCENTRIC (SYRINGE) ×3 IMPLANT
TOWEL OR 17X24 6PK STRL BLUE (TOWEL DISPOSABLE) ×3 IMPLANT
TOWEL OR 17X26 10 PK STRL BLUE (TOWEL DISPOSABLE) ×3 IMPLANT
UNDERPAD 30X30 INCONTINENT (UNDERPADS AND DIAPERS) ×3 IMPLANT
WATER STERILE IRR 1000ML POUR (IV SOLUTION) ×3 IMPLANT

## 2014-06-17 SURGICAL SUPPLY — 49 items
BAG DECANTER FOR FLEXI CONT (MISCELLANEOUS) ×3 IMPLANT
BENZOIN TINCTURE PRP APPL 2/3 (GAUZE/BANDAGES/DRESSINGS) ×6 IMPLANT
BLADE CLIPPER SURG (BLADE) ×3 IMPLANT
BLADE ULTRA TIP 2M (BLADE) IMPLANT
BUR MATCHSTICK NEURO 3.0 LAGG (BURR) ×3 IMPLANT
CANISTER SUCT 3000ML (MISCELLANEOUS) ×3 IMPLANT
CLOSURE WOUND 1/2 X4 (GAUZE/BANDAGES/DRESSINGS)
CONT SPEC 4OZ CLIKSEAL STRL BL (MISCELLANEOUS) ×3 IMPLANT
DECANTER SPIKE VIAL GLASS SM (MISCELLANEOUS) ×3 IMPLANT
DRAPE C-ARM 42X72 X-RAY (DRAPES) ×6 IMPLANT
DRAPE LAPAROTOMY 100X72 PEDS (DRAPES) ×3 IMPLANT
DRAPE POUCH INSTRU U-SHP 10X18 (DRAPES) ×3 IMPLANT
DRSG TELFA 3X8 NADH (GAUZE/BANDAGES/DRESSINGS) IMPLANT
DURAPREP 6ML APPLICATOR 50/CS (WOUND CARE) ×3 IMPLANT
ELECT REM PT RETURN 9FT ADLT (ELECTROSURGICAL) ×3
ELECTRODE REM PT RTRN 9FT ADLT (ELECTROSURGICAL) ×1 IMPLANT
GAUZE SPONGE 4X4 12PLY STRL (GAUZE/BANDAGES/DRESSINGS) IMPLANT
GAUZE SPONGE 4X4 16PLY XRAY LF (GAUZE/BANDAGES/DRESSINGS) IMPLANT
GLOVE ECLIPSE 6.5 STRL STRAW (GLOVE) ×3 IMPLANT
GLOVE EXAM NITRILE LRG STRL (GLOVE) IMPLANT
GLOVE EXAM NITRILE MD LF STRL (GLOVE) IMPLANT
GLOVE EXAM NITRILE XL STR (GLOVE) IMPLANT
GLOVE EXAM NITRILE XS STR PU (GLOVE) IMPLANT
GOWN STRL REUS W/ TWL LRG LVL3 (GOWN DISPOSABLE) IMPLANT
GOWN STRL REUS W/ TWL XL LVL3 (GOWN DISPOSABLE) ×1 IMPLANT
GOWN STRL REUS W/TWL 2XL LVL3 (GOWN DISPOSABLE) IMPLANT
GOWN STRL REUS W/TWL LRG LVL3 (GOWN DISPOSABLE)
GOWN STRL REUS W/TWL XL LVL3 (GOWN DISPOSABLE) ×2
KIT BASIN OR (CUSTOM PROCEDURE TRAY) ×3 IMPLANT
KIT ROOM TURNOVER OR (KITS) ×3 IMPLANT
NEEDLE HYPO 25X1 1.5 SAFETY (NEEDLE) ×3 IMPLANT
NS IRRIG 1000ML POUR BTL (IV SOLUTION) ×3 IMPLANT
PACK LAMINECTOMY NEURO (CUSTOM PROCEDURE TRAY) ×3 IMPLANT
PAD ARMBOARD 7.5X6 YLW CONV (MISCELLANEOUS) ×9 IMPLANT
PIN MAYFIELD SKULL DISP (PIN) ×3 IMPLANT
SPONGE LAP 4X18 X RAY DECT (DISPOSABLE) IMPLANT
SPONGE SURGIFOAM ABS GEL 100 (HEMOSTASIS) ×3 IMPLANT
STAPLER SKIN PROX WIDE 3.9 (STAPLE) ×3 IMPLANT
STRIP CLOSURE SKIN 1/2X4 (GAUZE/BANDAGES/DRESSINGS) IMPLANT
SUT ETHILON 3 0 FSL (SUTURE) IMPLANT
SUT VIC AB 0 CT1 18XCR BRD8 (SUTURE) ×1 IMPLANT
SUT VIC AB 0 CT1 8-18 (SUTURE) ×2
SUT VIC AB 2-0 CT1 18 (SUTURE) ×3 IMPLANT
SUT VIC AB 3-0 SH 8-18 (SUTURE) ×3 IMPLANT
SYR 20ML ECCENTRIC (SYRINGE) ×3 IMPLANT
TOWEL OR 17X24 6PK STRL BLUE (TOWEL DISPOSABLE) ×3 IMPLANT
TOWEL OR 17X26 10 PK STRL BLUE (TOWEL DISPOSABLE) ×3 IMPLANT
UNDERPAD 30X30 INCONTINENT (UNDERPADS AND DIAPERS) ×3 IMPLANT
WATER STERILE IRR 1000ML POUR (IV SOLUTION) ×3 IMPLANT

## 2014-06-17 NOTE — Anesthesia Procedure Notes (Signed)
Procedure Name: Intubation Date/Time: 06/17/2014 10:13 PM Performed by: Lelon Perla A Pre-anesthesia Checklist: Patient identified, Suction available, Emergency Drugs available, Patient being monitored and Timeout performed Patient Re-evaluated:Patient Re-evaluated prior to inductionOxygen Delivery Method: Circle system utilized Preoxygenation: Pre-oxygenation with 100% oxygen Intubation Type: IV induction Ventilation: Mask ventilation without difficulty Laryngoscope Size: Miller and 2 Grade View: Grade I Tube type: Subglottic suction tube Tube size: 8.0 mm Number of attempts: 1 Airway Equipment and Method: Stylet Placement Confirmation: ETT inserted through vocal cords under direct vision,  positive ETCO2 and breath sounds checked- equal and bilateral Secured at: 23 cm Tube secured with: Tape Dental Injury: Teeth and Oropharynx as per pre-operative assessment

## 2014-06-17 NOTE — Progress Notes (Signed)
[facility] [phone]   [phone]  CONSULT NOTE  REFERRING PHYSICIAN: Dr. Phylliss Bob  REASON FOR CONSULTATION:  80 years old white male with questionable metastatic bone lesions.  HPI Jeremiah Owens is a 80 y.o. male with past medical history significant for multiple medical problems including history of prostate cancer diagnosed in 2002 with Gleason score of 7 (3+4) and PSA of 8. He is status post external beam radiation and seed implants performed in [place]. The patient also has a history of malignant melanoma status post wide excision in 1994. He was seen recently by Dr. Lynann Bologna complaining of back pain of several weeks' duration that was getting worse and limiting his mobility. He ordered MRI of the thoracic spine without contrast which was performed on 06/04/2014 and it showed findings compatible with metastatic disease. There was a pathologic compression fractures of T5 which is secondary to tumor. There was also complete effacement of the subarachnoid space. There was infiltrating tumor extends into the posterior elements and expands the pedicles greater on the left than right with displacement of the cord to the right. There was also metastatic disease at the other levels including C7, T2, T3, T6, T10 and T11. The patient was referred to me today for further evaluation and recommendation regarding these finding on his MRI. He is feeling fine except for the persistent back pain. He takes only tramadol for pain management. He denied having any weakness in his lower extremities. He denied having any incontinence to urine or bowel movement. He has no chest pain, shortness breath, cough or hemoptysis. The patient denied having any nausea or vomiting or rectal bleeding. He has no significant weight loss or night sweats.  PAST MEDICAL HISTORY: significant for history of prostate cancer in 2002, melanoma in 1994, GERD, hearing deficit,  osteoarthritis, vitamin D deficiency.  FAMILY HISTORY: Mother had breast cancer, father died at age 35  SOCIAL HISTORY: the patient is married and has one son. He was accompanied by his [name]. 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 He has no history of smoking but drinks glass of wine every night and no history of drug abuse.   HPI   Social History History  Substance Use Topics  . Smoking status: Not on file  . Smokeless tobacco: Not on file  . Alcohol Use: Not on file    No Known Allergies  Current Outpatient Prescriptions  Medication Sig Dispense Refill  . celecoxib (CELEBREX) 200 MG capsule Take 200 mg by mouth 2 (two) times daily.    . cholecalciferol (VITAMIN D) 1000 UNITS tablet Take 1,000 Units by mouth daily.    . Glucosamine-Chondroit-Vit C-Mn (GLUCOSAMINE 1500 COMPLEX PO) Take 1 tablet by mouth daily.    Marland Kitchen HYDROCORTISONE, TOPICAL, 2.5 % SOLN Apply topically.    Marland Kitchen levothyroxine (SYNTHROID, LEVOTHROID) 25 MCG tablet Take 25 mcg by mouth daily.  4  . metroNIDAZOLE (METROCREAM) 0.75 % cream Apply topically 2 (two) times daily.    . Multiple Vitamins-Minerals (CENTRUM SILVER PO) Take 1 tablet by mouth daily.    Marland Kitchen omeprazole (PRILOSEC) 40 MG capsule Take 40 mg by mouth daily.  11  . ranitidine (ZANTAC) 150 MG tablet Take 150 mg by mouth daily.  11  . tamsulosin (FLOMAX) 0.4 MG CAPS capsule Take 0.4 mg by mouth daily.  3   No current facility-administered medications for this visit.    Review of Systems  Constitutional:  negative Eyes: negative Ears, nose, mouth, throat, and face: negative Respiratory: negative Cardiovascular: negative Gastrointestinal: negative Genitourinary:negative Integument/breast: negative Hematologic/lymphatic: negative Musculoskeletal:positive for back pain Neurological: negative Behavioral/Psych: negative Endocrine: negative Allergic/Immunologic: negative  Physical Exam  BDZ:HGDJM, healthy, no distress, well  nourished and well developed SKIN: skin color, texture, turgor are normal, no rashes or significant lesions HEAD: Normocephalic, No masses, lesions, tenderness or abnormalities EYES: normal, PERRLA EARS: External ears normal, Canals clear OROPHARYNX:no exudate, no erythema and lips, buccal mucosa, and tongue normal  NECK: supple, no adenopathy, no JVD LYMPH:  no palpable lymphadenopathy, no hepatosplenomegaly LUNGS: clear to auscultation , and palpation HEART: regular rate & rhythm, no murmurs and no gallops ABDOMEN:abdomen soft, non-tender, normal bowel sounds and no masses or organomegaly BACK: Back symmetric, no curvature., No CVA tenderness EXTREMITIES:no joint deformities, effusion, or inflammation, no edema, no skin discoloration  NEURO: alert & oriented x 3 with fluent speech, no focal motor/sensory deficits  PERFORMANCE STATUS: ECOG 1  LABORATORY DATA: Lab Results  Component Value Date   WBC 5.1 06/15/2014   HGB 12.7* 06/15/2014   HCT 38.7 06/15/2014   MCV 98.2* 06/15/2014   PLT 156 06/15/2014      Chemistry      Component Value Date/Time   NA 134* 06/15/2014 1139   K 4.6 06/15/2014 1139   CO2 28 06/15/2014 1139   BUN 23.9 06/15/2014 1139   CREATININE 1.1 06/15/2014 1139      Component Value Date/Time   CALCIUM 9.3 06/15/2014 1139   ALKPHOS 64 06/15/2014 1139   AST 25 06/15/2014 1139   ALT 15 06/15/2014 1139   BILITOT 0.43 06/15/2014 1139       RADIOGRAPHIC STUDIES: Dg Ugi W/kub  05/23/2014   CLINICAL DATA:  Symptoms of reflux  EXAM: UPPER GI SERIES WITH KUB  TECHNIQUE: After obtaining a scout radiograph a routine upper GI series was performed using thin barium  FLUOROSCOPY TIME:  2 min 42 seconds  COMPARISON:  None.  FINDINGS: A preliminary film of the abdomen shows no bowel obstruction. There is a lumbar scoliosis convex to the right with degenerative change. No opaque calculi are noted.  A single contrast upper GI was performed. There is some prominence of  the cricopharyngeus muscle noted. However, for age peristalsis is within normal limits. There does appear to be a small hiatal hernia. No definite gastroesophageal reflux could be demonstrated however. A pill was given at the end of the study which did pass into the stomach without delay.  The stomach is normal in contour and peristalsis. The duodenal bulb fills and the duodenal loop is in normal position. There does appear to be a proximal jejunal diverticulum present.  IMPRESSION: 1. Small hiatal hernia. No gastroesophageal reflux could be demonstrated. Prominent cricopharyngeus muscle. Barium pill passes into the stomach without delay. 2. No abnormality of the stomach or duodenum. Proximal jejunal diverticulum. 3. Lumbar scoliosis with diffuse degenerative change in the lumbar spine.   Electronically Signed   By: Ivar Drape M.D.   On: 05/23/2014 10:53    ASSESSMENT: This is a very pleasant 80 years old white male with a remote history of prostate cancer as well as malignant melanoma who presented with multiple bone lesions suspicious for metastatic neoplasm of unknown primary at this point. The patient is currently neurologically intact but has persistent back pain.   PLAN: I had a lengthy discussion with the patient and his wife today about his current condition and treatment options. I recommended for  the patient to have a PET scan performed for further evaluation of his disease and to identify the primary etiology. I also recommended for the patient to start treatment with Decadron 6 mg by mouth every 6 hours. I would consider referring the patient to radiation oncology for evaluation and consideration of palliative radiotherapy to the T5 lesion which is concerning for cord compression. The patient was also advised to go immediately to the emergency department if he has any worsening of his pain or any neurological deficit. I would see him back for follow-up visit after the PET scan for further  evaluation of his condition and consideration of biopsy of the most accessible lesion. The patient voices understanding of current disease status and treatment options and is in agreement with the current care plan.  All questions were answered. The patient knows to call the clinic with any problems, questions or concerns. We can certainly see the patient much sooner if necessary.  Thank you so much for allowing me to participate in the care of Jeremiah Owens. I will continue to follow up the patient with you and assist in his care.  I spent 40 minutes counseling the patient face to face. The total time spent in the appointment was 60 minutes.  Disclaimer: This note was dictated with voice recognition software. Similar sounding words can inadvertently be transcribed and may not be corrected upon review.  Addendum: After reviewing the MRI images which were not available to me at the time of the visit, I called the patient and ask him to go immediately to the emergency department for further evaluation because of the concern about the cord compression at the T5 area. He may benefit from evaluation by neurosurgery and radiation oncology. I spoke to Dr. Lindi Adie and informed him about the case and he would see him once he gets to the hospital. [facility] K. 06/17/2014, 12:45 PM

## 2014-06-17 NOTE — Anesthesia Preprocedure Evaluation (Addendum)
Anesthesia Evaluation  Patient identified by MRN, date of birth, ID band Patient awake    Reviewed: Allergy & Precautions, H&P , NPO status , Patient's Chart, lab work & pertinent test results  Airway Mallampati: II   Neck ROM: full    Dental   Pulmonary neg pulmonary ROS,          Cardiovascular negative cardio ROS      Neuro/Psych Thoracic tumor  + mets    GI/Hepatic   Endo/Other    Renal/GU    H/o prostate CA    Musculoskeletal   Abdominal   Peds  Hematology   Anesthesia Other Findings   Reproductive/Obstetrics                            Anesthesia Physical Anesthesia Plan  ASA: II and emergent  Anesthesia Plan: General   Post-op Pain Management:    Induction: Intravenous  Airway Management Planned: Oral ETT  Additional Equipment:   Intra-op Plan:   Post-operative Plan: Extubation in OR  Informed Consent: I have reviewed the patients History and Physical, chart, labs and discussed the procedure including the risks, benefits and alternatives for the proposed anesthesia with the patient or authorized representative who has indicated his/her understanding and acceptance.     Plan Discussed with: CRNA, Anesthesiologist and Surgeon  Anesthesia Plan Comments:         Anesthesia Quick Evaluation

## 2014-06-17 NOTE — Progress Notes (Signed)
[facility] Progress Note Patient Name: Jeremiah Owens DOB: 1930/03/18 MRN: [id number]   Date of Service  06/17/2014  HPI/Events of Note   Called by ED for possible admission following spinal fixation by NSG. Per EDP no active medical issues. Plan is for biopsy tonight. No real indication for PCCM co-management for now.   eICU Interventions   Patient to remain on NSG service. If needs change following surgery NSG will re consult.      Intervention Category Minor Interventions: Communication with other healthcare providers and/or family  Jabrea Kallstrom R. 06/17/2014, 7:55 PM

## 2014-06-17 NOTE — Progress Notes (Signed)
Patient admitted to floor from [facility] via transport. Patient is oriented with expressed weakness in lower extremities.  Patient to go to OR. Interventions performed to expedite process.

## 2014-06-17 NOTE — H&P (Signed)
Jeremiah Owens is an 80 y.o. male.  Complaints of chest pain since this summer. Pain has been increasing since that time. MRI ordered by Dr. Lynann Bologna performed on November 5th revealed spinal cord compression at T5, pathologic fracture at T5, metastatic disease in the thoracic spine, without a known primary. History of  Neck melanoma treated in the 90's via excision, and prostate cancer treated in the early 2000's. Has otherwise been in generally good health.  Chief Complaint: Chest pain, Thoracic tumor HPI: as above  Past Medical History  Diagnosis Date  . Compression fracture   . Prostate cancer   . Melanoma     Past Surgical History  Procedure Laterality Date  . Hernia repair      History reviewed. No pertinent family history. Social History:  reports that he has never smoked. He does not have any smokeless tobacco history on file. He reports that he does not drink alcohol or use illicit drugs.  Allergies:  Allergies  Allergen Reactions  . Iodine Itching    Patient had a reaction 20 years ago and can't remember what kind of reaction he had.  Patient was given decadron in the ER and had an Angio ordered and did test with no problems.  Bottom line "patient needs to pre-medicated before any IV dye is given".     (Not in a hospital admission)  Results for orders placed or performed during the hospital encounter of 06/17/14 (from the past 48 hour(s))  CBC with Differential     Status: Abnormal   Collection Time: 06/17/14  4:38 PM  Result Value Ref Range   WBC 5.8 4.0 - 10.5 K/uL   RBC 4.20 (L) 4.22 - 5.81 MIL/uL   Hemoglobin 13.9 13.0 - 17.0 g/dL   HCT 40.5 39.0 - 52.0 %   MCV 96.4 78.0 - 100.0 fL   MCH 33.1 26.0 - 34.0 pg   MCHC 34.3 30.0 - 36.0 g/dL   RDW 13.4 11.5 - 15.5 %   Platelets 167 150 - 400 K/uL   Neutrophils Relative % 67 43 - 77 %   Neutro Abs 3.8 1.7 - 7.7 K/uL   Lymphocytes Relative 24 12 - 46 %   Lymphs Abs 1.4 0.7 - 4.0 K/uL   Monocytes Relative 8 3 - 12 %    Monocytes Absolute 0.5 0.1 - 1.0 K/uL   Eosinophils Relative 1 0 - 5 %   Eosinophils Absolute 0.1 0.0 - 0.7 K/uL   Basophils Relative 0 0 - 1 %   Basophils Absolute 0.0 0.0 - 0.1 K/uL  Comprehensive metabolic panel     Status: Abnormal   Collection Time: 06/17/14  4:38 PM  Result Value Ref Range   Sodium 139 137 - 147 mEq/L   Potassium 4.8 3.7 - 5.3 mEq/L   Chloride 100 96 - 112 mEq/L   CO2 25 19 - 32 mEq/L   Glucose, Bld 113 (H) 70 - 99 mg/dL   BUN 22 6 - 23 mg/dL   Creatinine, Ser 1.01 0.50 - 1.35 mg/dL   Calcium 9.8 8.4 - 10.5 mg/dL   Total Protein 9.5 (H) 6.0 - 8.3 g/dL   Albumin 4.0 3.5 - 5.2 g/dL   AST 36 0 - 37 U/L    Comment: SLIGHT HEMOLYSIS HEMOLYSIS AT THIS LEVEL MAY AFFECT RESULT CORRECTED RESULTS CALLED TO: M HAMBY AT 1750 ON 11.14.2015 BY NBROOKS CORRECTED ON 11/14 AT 1746: PREVIOUSLY REPORTED AS 23 SLIGHT HEMOLYSIS HEMOLYSIS AT THIS LEVEL MAY AFFECT RESULT  ALT 23 0 - 53 U/L   Alkaline Phosphatase 73 39 - 117 U/L   Total Bilirubin 0.4 0.3 - 1.2 mg/dL   GFR calc non Af Amer 67 (L) >90 mL/min   GFR calc Af Amer 78 (L) >90 mL/min    Comment: (NOTE) The eGFR has been calculated using the CKD EPI equation. This calculation has not been validated in all clinical situations. eGFR's persistently <90 mL/min signify possible Chronic Kidney Disease.    Anion gap 14 5 - 15  Troponin I     Status: None   Collection Time: 06/17/14  4:38 PM  Result Value Ref Range   Troponin I <0.30 <0.30 ng/mL    Comment:        Due to the release kinetics of cTnI, a negative result within the first hours of the onset of symptoms does not rule out myocardial infarction with certainty. If myocardial infarction is still suspected, repeat the test at appropriate intervals.   D-dimer, quantitative     Status: Abnormal   Collection Time: 06/17/14  4:38 PM  Result Value Ref Range   D-Dimer, Quant 2.74 (H) 0.00 - 0.48 ug/mL-FEU    Comment:        AT THE INHOUSE ESTABLISHED  CUTOFF VALUE OF 0.48 ug/mL FEU, THIS ASSAY HAS BEEN DOCUMENTED IN THE LITERATURE TO HAVE A SENSITIVITY AND NEGATIVE PREDICTIVE VALUE OF AT LEAST 98 TO 99%.  THE TEST RESULT SHOULD BE CORRELATED WITH AN ASSESSMENT OF THE CLINICAL PROBABILITY OF DVT / VTE.    Dg Chest 2 View  06/17/2014   CLINICAL DATA:  Shortness of breath, upper chest pain  EXAM: CHEST  2 VIEW  COMPARISON:  01/02/2014  FINDINGS: Cardiomediastinal silhouette is stable. No acute infiltrate or pleural effusion. No pulmonary edema. Mild basilar atelectasis. Mild degenerative changes thoracic spine.  IMPRESSION: No acute infiltrate or pulmonary edema.  Mild basilar atelectasis.   Electronically Signed   By: Lahoma Crocker M.D.   On: 06/17/2014 18:04   Ct Angio Chest Pe W/cm &/or Wo Cm  06/17/2014   CLINICAL DATA:  Chest pain shortness of breath concern for pulmonary embolism initial evaluation ; Personal history of malignant melanoma, with metastatic disease currently involving thoracic spine  EXAM: CT ANGIOGRAPHY CHEST WITH CONTRAST  TECHNIQUE: Multidetector CT imaging of the chest was performed using the standard protocol during bolus administration of intravenous contrast. Multiplanar CT image reconstructions and MIPs were obtained to evaluate the vascular anatomy.  CONTRAST:  180m OMNIPAQUE IOHEXOL 350 MG/ML SOLN  COMPARISON:  06/17/2014, 06/08/2014  FINDINGS: Parietal filling defects in the pulmonary arterial system. Thoracic aorta shows no evidence of dissection or dilatation. Numerous small to borderline mediastinal lymph nodes and hilar lymph nodes, nonspecific. No significant pleural or pericardial effusion. Moderate bilateral dependent atelectasis. Known moderate T5 compression deformity with extensive lytic change consistent with metastasis. No other acute musculoskeletal findings.  Partially visualized low-attenuation lesion upper pole right kidney with average attenuation value of 0 most consistent with a partially visualized  cyst measuring between 2 and 3 cm.  Review of the MIP images confirms the above findings.  IMPRESSION: No evidence of pulmonary arterial embolism.  Known lytic metastatic lesion T5 vertebral body with compression deformity   Electronically Signed   By: RSkipper ClicheM.D.   On: 06/17/2014 19:16    Review of Systems  HENT: Negative.   Eyes: Negative.   Respiratory: Negative.   Cardiovascular: Positive for chest pain and leg swelling.  Gastrointestinal: Negative.  Genitourinary: Negative.   Musculoskeletal: Positive for back pain.  Skin: Negative.   Neurological: Positive for sensory change, focal weakness and weakness.  Endo/Heme/Allergies: Negative.   Psychiatric/Behavioral: Negative.     Blood pressure 152/75, pulse 81, temperature 97.7 F (36.5 C), temperature source Oral, resp. rate 18, SpO2 96 %. Physical Exam  Constitutional: He is oriented to person, place, and time. He appears well-developed and well-nourished. No distress.  HENT:  Head: Normocephalic and atraumatic.  Eyes: Conjunctivae and EOM are normal. Pupils are equal, round, and reactive to light.  Neck: Normal range of motion. Neck supple.  Cardiovascular: Normal rate, regular rhythm and normal heart sounds.   Respiratory: Effort normal and breath sounds normal.  GI: Soft.  Neurological: He is alert and oriented to person, place, and time. He has normal reflexes. He displays normal reflexes. No cranial nerve deficit. He exhibits normal muscle tone. Coordination and gait abnormal. He displays no Babinski's sign on the right side. He displays no Babinski's sign on the left side.  Proprioception severely diminished at great toe bilaterally, present at right ankle, diminished at left ankle Decreased light touch, and temperature left v. Right Normal strength on manual exam in lower extremities, and normal in upper extremities Voiding without difficulty  Skin: Skin is warm and dry.  Psychiatric: He has a normal mood and  affect. His behavior is normal. Judgment and thought content normal.     Assessment/Plan Emergent or for spinal stabilization, and decompression spinal cord. I have explained the risks and benefits to Mr. Borsuk. He understands that without surgery there is no reasonable expectation that he would improve, and also that his neurologic status would continue to deteriorate. I have spoken with Dr. Lisbeth Renshaw of Radiation Oncology and we both agree that stabilization, decompression, and pathologic diagnosis is better than radiating without known pathology. His pain is most likely due to mechanical instability.Mr.and Mrs. Laduca understand and agree with surgery.   , L 06/17/2014, 8:01 PM

## 2014-06-17 NOTE — Consult Note (Signed)
Radiation Oncology         [phone]) [phone] ________________________________  Name: Jeremiah Owens MRN: [id number]  Date: 06/17/2014  DOB: 10/04/1929   DIAGNOSIS: spine metastasis   HISTORY OF PRESENT ILLNESS::Jeremiah Owens is a 80 y.o. male who is seen for an initial consultation visit regarding the patient's diagnosis of metastatic disease involving the spine.  The patient has a history of prostate cancer diagnosed in 2002 for which he underwent radiation treatment. The patient has been in clinical remission regarding this since that time and his recent PSA level has been undetectable. He also has a history of excision of a melanoma from the right neck. No further treatment for this and again he has been clinically NED regarding this diagnosis.  The patient has been complaining of upper mid back pain as well as pain in the anterior chest bilaterally, greater on the left. He underwent evaluation for this which did not return with any significant cardiac issues according to the patient. He then subsequently had an MRI scan of the thoracic spine which showed multilevel metastatic disease within the spine. Most prominent is significant involvement and severe retropulsion of the T5 vertebral body in the setting of pathologic fracture. Cord compression was present at this level with the spinal cord shifted to the right with no visible CSF at that level.  The patient was subsequently seen by Dr. Earlie Server who did not have the scan initially. Upon review, the patient was instructed to present to the emergency room. He had been scheduled for a PET scan and consultation with radiation oncology and neurosurgery was planned.  The patient at this time states that he has experienced some lower extremity weakness for at least several weeks. This is been in the setting of worsening upper back and chest pain. He began using a walker and several days ago, at least by Thursday and possibly Wednesday, he was not able to  walk effectively even with the walker. The patient denies any significant numbness. No change in bowel or bladder function.    PREVIOUS RADIATION THERAPY: Yes the patient received external beam radiation treatment and prostate brachytherapy in [place] for his prostate cancer in 2002.   PAST MEDICAL HISTORY:  has a past medical history of Compression fracture; Prostate cancer; and Melanoma.     PAST SURGICAL HISTORY: Past Surgical History  Procedure Laterality Date  . Hernia repair       FAMILY HISTORY: family history is not on file.   SOCIAL HISTORY:  reports that he has never smoked. He does not have any smokeless tobacco history on file. He reports that he does not drink alcohol or use illicit drugs.   ALLERGIES: Iodine   MEDICATIONS:  No current facility-administered medications for this encounter.   Current Outpatient Prescriptions  Medication Sig Dispense Refill  . celecoxib (CELEBREX) 200 MG capsule Take 200 mg by mouth every other day.     . cholecalciferol (VITAMIN D) 1000 UNITS tablet Take 1,000 Units by mouth at bedtime.     . Glucosamine-Chondroit-Vit C-Mn (GLUCOSAMINE 1500 COMPLEX PO) Take 2 tablets by mouth daily after breakfast.     . HYDROcodone-acetaminophen (NORCO/VICODIN) 5-325 MG per tablet Take 1-2 tablets by mouth every 4 (four) hours as needed for moderate pain.    Marland Kitchen HYDROCORTISONE, TOPICAL, 2.5 % SOLN Apply 1 application topically daily as needed.     Marland Kitchen levothyroxine (SYNTHROID, LEVOTHROID) 25 MCG tablet Take 25 mcg by mouth daily.  4  . metroNIDAZOLE (METROCREAM) 0.75 % cream  Apply 1 application topically See admin instructions. Every time pt washes face, he put a small amount on his nose.    . Multiple Vitamins-Minerals (CENTRUM SILVER PO) Take 1 tablet by mouth daily.    Marland Kitchen omeprazole (PRILOSEC) 40 MG capsule Take 40 mg by mouth daily. In the afternoon  11  . Polyvinyl Alcohol-Povidone (REFRESH OP) Apply 1 drop to eye daily as needed (dry/irritated  eyes).    . ranitidine (ZANTAC) 150 MG tablet Take 150 mg by mouth at bedtime.   11  . tamsulosin (FLOMAX) 0.4 MG CAPS capsule Take 0.4 mg by mouth at bedtime.   3  . dexamethasone (DECADRON) 4 MG tablet Take 1.5 tablets (6 mg total) by mouth 4 (four) times daily. 60 tablet 0     REVIEW OF SYSTEMS:  A 15 point review of systems is documented in the electronic medical record. This was obtained by the nursing staff. However, I reviewed this with the patient to discuss relevant findings and make appropriate changes.  Pertinent items are noted in HPI.    PHYSICAL EXAM:  oral temperature is 97.7 F (36.5 C). His blood pressure is 152/75 and his pulse is 81. His respiration is 18 and oxygen saturation is 96%.   ECOG = 2  0 - Asymptomatic (Fully active, able to carry on all predisease activities without restriction)  1 - Symptomatic but completely ambulatory (Restricted in physically strenuous activity but ambulatory and able to carry out work of a light or sedentary nature. For example, light housework, office work)  2 - Symptomatic, <50% in bed during the day (Ambulatory and capable of all self care but unable to carry out any work activities. Up and about more than 50% of waking hours)  3 - Symptomatic, >50% in bed, but not bedbound (Capable of only limited self-care, confined to bed or chair 50% or more of waking hours)  4 - Bedbound (Completely disabled. Cannot carry on any self-care. Totally confined to bed or chair)  5 - Death   Eustace Pen MM, Creech RH, Tormey DC, et al. [id number]). "Toxicity and response criteria of the [facility]". [place] Oncol. 5 (6): 649-55  General: Well-developed, in no acute distress HEENT: Normocephalic, atraumatic; oral cavity clear Neck: Supple without any lymphadenopathy Cardiovascular: Regular rate and rhythm Respiratory: Clear to auscultation bilaterally GI: Soft, nontender, normal bowel sounds Extremities: some edema present in the  lower extremities bilaterally Neuro: 5/5 strength in the upper extremities bilaterally. The patient has reasonable strength on exam bilaterally in the lower extremities in terms of dorsiflexion, plantar flexion, and proximal strength with leg raise, slightly greater on the right but essentially 4/5 bilaterally     LABORATORY DATA:  Lab Results  Component Value Date   WBC 5.8 06/17/2014   HGB 13.9 06/17/2014   HCT 40.5 06/17/2014   MCV 96.4 06/17/2014   PLT 167 06/17/2014   Lab Results  Component Value Date   NA 139 06/17/2014   K 4.8 06/17/2014   CL 100 06/17/2014   CO2 25 06/17/2014   Lab Results  Component Value Date   ALT 23 06/17/2014   AST 36 06/17/2014   ALKPHOS 73 06/17/2014   BILITOT 0.4 06/17/2014      RADIOGRAPHY: Dg Chest 2 View  06/17/2014   CLINICAL DATA:  Shortness of breath, upper chest pain  EXAM: CHEST  2 VIEW  COMPARISON:  01/02/2014  FINDINGS: Cardiomediastinal silhouette is stable. No acute infiltrate or pleural effusion. No pulmonary  edema. Mild basilar atelectasis. Mild degenerative changes thoracic spine.  IMPRESSION: No acute infiltrate or pulmonary edema.  Mild basilar atelectasis.   Electronically Signed   By: Lahoma Crocker M.D.   On: 06/17/2014 18:04   Dg Duanne Limerick W/kub  05/23/2014   CLINICAL DATA:  Symptoms of reflux  EXAM: UPPER GI SERIES WITH KUB  TECHNIQUE: After obtaining a scout radiograph a routine upper GI series was performed using thin barium  FLUOROSCOPY TIME:  2 min 42 seconds  COMPARISON:  None.  FINDINGS: A preliminary film of the abdomen shows no bowel obstruction. There is a lumbar scoliosis convex to the right with degenerative change. No opaque calculi are noted.  A single contrast upper GI was performed. There is some prominence of the cricopharyngeus muscle noted. However, for age peristalsis is within normal limits. There does appear to be a small hiatal hernia. No definite gastroesophageal reflux could be demonstrated however. A pill was  given at the end of the study which did pass into the stomach without delay.  The stomach is normal in contour and peristalsis. The duodenal bulb fills and the duodenal loop is in normal position. There does appear to be a proximal jejunal diverticulum present.  IMPRESSION: 1. Small hiatal hernia. No gastroesophageal reflux could be demonstrated. Prominent cricopharyngeus muscle. Barium pill passes into the stomach without delay. 2. No abnormality of the stomach or duodenum. Proximal jejunal diverticulum. 3. Lumbar scoliosis with diffuse degenerative change in the lumbar spine.   Electronically Signed   By: Ivar Drape M.D.   On: 05/23/2014 10:53       IMPRESSION/PLAN:  The patient has a history remotely of prostate cancer and melanoma with no known active disease. Last PSA recently this past week was undetectable. He has apparent metastasis which is multifocal within the spine. He has a pathologic fracture with retropulsion and cord compression at T5 which likely accounts for the patient's worsening lower extremity strength. The patient has begun steroids in the emergency room. He has not taken any prior to today.  I have been asked to see the patient for consideration of urgent palliative radiation treatment to this area. Neurosurgery has also been consult and I have spoken with Dr.Cabell who is in the process of evaluating the patient. Given the extent of disease at this location and the timeframe of the patient's symptoms, I believe that surgery is a strong consideration. If Dr. Cyndy Freeze feels that surgery is appropriate, then the patient will receive postoperative radiation treatment to this area after some recovery. If surgery is not feasible, then emergent radiation treatment will be discussed with the patient. I have discussed with the patient already that we do not have a clear diagnosis at this time regarding these changes. This is 1 negative aspect of treating the patient emergently, but I believe  this is nevertheless a consideration given the rapid progression of the patient's symptoms and the fact that he does continue to have some reasonable residual strength on exam today.  -emergent radiation treatment versus initial surgery followed by postoperative radiation treatment -Continue Decadron -CT scan of abdomen/pelvis and MRI scan of remainder of the spine can be completed during the patient's hospital stay -Biopsy of metastatic site     ________________________________   Jodelle Gross, MD, PhD   **Disclaimer: This note was dictated with voice recognition software. Similar sounding words can inadvertently be transcribed and this note may contain transcription errors which may not have been corrected upon publication of note.**

## 2014-06-17 NOTE — Progress Notes (Signed)
VASCULAR LAB PRELIMINARY  PRELIMINARY  PRELIMINARY  PRELIMINARY  Bilateral lower extremity venous Dopplers completed.    Preliminary report:  There is no DVT or SVT noted in the bilateral lower extremities.  There is a moderate sized Baker's cyst noted in the right popliteal fossa.  Charmian Forbis, RVT 06/17/2014, 6:28 PM

## 2014-06-17 NOTE — ED Notes (Signed)
Pt's wife states that pt had an MRI done on Wednesday and Dr. Mora Appl called them today and told them to come to the ER because he was concerned about a lesion that he saw.  Pt has developed knew lower leg weakness in the last 2-3 days.  States he cannot stand on his own.

## 2014-06-17 NOTE — ED Provider Notes (Signed)
CSN: [id number]     Arrival date & time 06/17/14  1452 History   First MD Initiated Contact with Patient 06/17/14 1520     Chief Complaint  Patient presents with  . Weakness  . Lesion to Spine       HPI Patient presents with worsening back pain over the past 2 weeks and an outpatient MRI demonstrating new metastatic disease to the spine as well as a T5 tumor with compression fracture and retropulsion and edema of the cord.  Patient and family report increasing weakness in his lower extremities over the past several days.  He can no longer walk at this time.  He is being pushed around by his family.  He also reports intermittent chest pain and shortness of breath over the past several days.  He's been worked up by his cardiologist without a clear etiology.  He reports worsening bilateral leg pain and leg swelling.  He also reports pleuritic anterior chest pain.  New exertional shortness of breath.  No history DVT or pulmonary embolism.  History of remote prostate cancer in remission.  This is new metastatic cancer of unknown primary.  Patient was seen by his oncologist today and was sent to the ER for further evaluation.    Past Medical History  Diagnosis Date  . Compression fracture   . Prostate cancer   . Melanoma    Past Surgical History  Procedure Laterality Date  . Hernia repair     History reviewed. No pertinent family history. History  Substance Use Topics  . Smoking status: Never Smoker   . Smokeless tobacco: Not on file  . Alcohol Use: No    Review of Systems  All other systems reviewed and are negative.     Allergies  Review of patient's allergies indicates no known allergies.  Home Medications   Prior to Admission medications   Medication Sig Start Date End Date Taking? Authorizing Provider  celecoxib (CELEBREX) 200 MG capsule Take 200 mg by mouth every other day.    Yes Historical Provider, MD  cholecalciferol (VITAMIN D) 1000 UNITS tablet Take 1,000 Units  by mouth at bedtime.    Yes Historical Provider, MD  Glucosamine-Chondroit-Vit C-Mn (GLUCOSAMINE 1500 COMPLEX PO) Take 2 tablets by mouth daily after breakfast.    Yes Historical Provider, MD  HYDROcodone-acetaminophen (NORCO/VICODIN) 5-325 MG per tablet Take 1-2 tablets by mouth every 4 (four) hours as needed for moderate pain.   Yes Historical Provider, MD  HYDROCORTISONE, TOPICAL, 2.5 % SOLN Apply 1 application topically daily as needed.    Yes Historical Provider, MD  levothyroxine (SYNTHROID, LEVOTHROID) 25 MCG tablet Take 25 mcg by mouth daily. 05/06/14  Yes Historical Provider, MD  metroNIDAZOLE (METROCREAM) 0.75 % cream Apply 1 application topically See admin instructions. Every time pt washes face, he put a small amount on his nose.   Yes Historical Provider, MD  Multiple Vitamins-Minerals (CENTRUM SILVER PO) Take 1 tablet by mouth daily.   Yes Historical Provider, MD  omeprazole (PRILOSEC) 40 MG capsule Take 40 mg by mouth daily. In the afternoon 05/29/14  Yes Historical Provider, MD  Polyvinyl Alcohol-Povidone (REFRESH OP) Apply 1 drop to eye daily as needed (dry/irritated eyes).   Yes Historical Provider, MD  ranitidine (ZANTAC) 150 MG tablet Take 150 mg by mouth at bedtime.  05/23/14  Yes Historical Provider, MD  tamsulosin (FLOMAX) 0.4 MG CAPS capsule Take 0.4 mg by mouth at bedtime.  04/13/14  Yes Historical Provider, MD  dexamethasone (DECADRON) 4  MG tablet Take 1.5 tablets (6 mg total) by mouth 4 (four) times daily. 06/17/14   Curt Bears, MD   BP 133/58 mmHg  Pulse 83  Temp(Src) 97.7 F (36.5 C) (Oral)  Resp 18  SpO2 100% Physical Exam  Constitutional: He is oriented to person, place, and time. He appears well-developed and well-nourished.  HENT:  Head: Normocephalic and atraumatic.  Eyes: EOM are normal.  Neck: Normal range of motion.  Cardiovascular: Normal rate, regular rhythm, normal heart sounds and intact distal pulses.   Pulmonary/Chest: Effort normal and breath  sounds normal. No respiratory distress.  Abdominal: Soft. He exhibits no distension. There is no tenderness.  Musculoskeletal: Normal range of motion.  No cervical tenderness.  Mild thoracic tenderness without step-off.  No lumbar tenderness.  Generalized weakness of his lower extremities.  No focal weakness of his lower extremities.  Decent strength bilaterally.  Normal strength in his upper extremities.  Neurological: He is alert and oriented to person, place, and time.  Skin: Skin is warm and dry.  Psychiatric: He has a normal mood and affect. Judgment normal.  Nursing note and vitals reviewed.   ED Course  Procedures (including critical care time)  CRITICAL CARE Performed by: Hoy Morn Total critical care time: 45 Critical care time was exclusive of separately billable procedures and treating other patients. Critical care was necessary to treat or prevent imminent or life-threatening deterioration. Critical care was time spent personally by me on the following activities: development of treatment plan with patient and/or surrogate as well as nursing, discussions with consultants, evaluation of patient's response to treatment, examination of patient, obtaining history from patient or surrogate, ordering and performing treatments and interventions, ordering and review of laboratory studies, ordering and review of radiographic studies, pulse oximetry and re-evaluation of patient's condition.   Labs Review Labs Reviewed  CBC WITH DIFFERENTIAL - Abnormal; Notable for the following:    RBC 4.20 (*)    All other components within normal limits  COMPREHENSIVE METABOLIC PANEL - Abnormal; Notable for the following:    Glucose, Bld 113 (*)    Total Protein 9.5 (*)    GFR calc non Af Amer 67 (*)    GFR calc Af Amer 78 (*)    All other components within normal limits  D-DIMER, QUANTITATIVE - Abnormal; Notable for the following:    D-Dimer, Quant 2.74 (*)    All other components within  normal limits  TROPONIN I  COMPREHENSIVE METABOLIC PANEL  CBC WITH DIFFERENTIAL  APTT  PROTIME-INR    Imaging Review Dg Chest 2 View  06/17/2014   CLINICAL DATA:  Shortness of breath, upper chest pain  EXAM: CHEST  2 VIEW  COMPARISON:  01/02/2014  FINDINGS: Cardiomediastinal silhouette is stable. No acute infiltrate or pleural effusion. No pulmonary edema. Mild basilar atelectasis. Mild degenerative changes thoracic spine.  IMPRESSION: No acute infiltrate or pulmonary edema.  Mild basilar atelectasis.   Electronically Signed   By: Lahoma Crocker M.D.   On: 06/17/2014 18:04   Ct Angio Chest Pe W/cm &/or Wo Cm  06/17/2014   CLINICAL DATA:  Chest pain shortness of breath concern for pulmonary embolism initial evaluation ; Personal history of malignant melanoma, with metastatic disease currently involving thoracic spine  EXAM: CT ANGIOGRAPHY CHEST WITH CONTRAST  TECHNIQUE: Multidetector CT imaging of the chest was performed using the standard protocol during bolus administration of intravenous contrast. Multiplanar CT image reconstructions and MIPs were obtained to evaluate the vascular anatomy.  CONTRAST:  123mL OMNIPAQUE IOHEXOL 350 MG/ML SOLN  COMPARISON:  06/17/2014, 06/08/2014  FINDINGS: Parietal filling defects in the pulmonary arterial system. Thoracic aorta shows no evidence of dissection or dilatation. Numerous small to borderline mediastinal lymph nodes and hilar lymph nodes, nonspecific. No significant pleural or pericardial effusion. Moderate bilateral dependent atelectasis. Known moderate T5 compression deformity with extensive lytic change consistent with metastasis. No other acute musculoskeletal findings.  Partially visualized low-attenuation lesion upper pole right kidney with average attenuation value of 0 most consistent with a partially visualized cyst measuring between 2 and 3 cm.  Review of the MIP images confirms the above findings.  IMPRESSION: No evidence of pulmonary arterial  embolism.  Known lytic metastatic lesion T5 vertebral body with compression deformity   Electronically Signed   By: Skipper Cliche M.D.   On: 06/17/2014 19:16  I personally reviewed the imaging tests through PACS system I reviewed available ER/hospitalization records through the EMR   ECG interpretation   Date: 06/17/2014  Rate: 83  Rhythm: normal sinus rhythm  QRS Axis: normal  Intervals: normal  ST/T Wave abnormalities: normal  Conduction Disutrbances: none  Narrative Interpretation:   Old EKG Reviewed: No significant changes noted     MDM   Final diagnoses:  Chest pain  Thoracic spine tumor  Metastatic cancer to spine      4:08 PM Call placed to radiation oncology as I believe the pt will benefit from emergent radiation for new T5 tumor with cord edema and increased lower extremity weakness.   8:25 PM Patient was seen by radiation oncology and they think the patient would benefit more from a surgical procedure tonight to stabilize the spine.  Case was discussed with Dr. Cleda Mccreedy saw and evaluated patient in the emergency department and will be taking the patient to emergency surgery at this time.  Patient will need ongoing workup in the hospital for his cancer.  Given his anterior chest pain I do not believe this to be ACS.  Elevated d-dimer.  CTA of the chest negative for pulmonary embolism.  No thoracic abnormalities.  T5 lesion noted on CT as well.  Transfer to [facility] for surgery.  Admit to the ICU.  Hoy Morn, MD 06/17/14 [phone]

## 2014-06-18 ENCOUNTER — Inpatient Hospital Stay (HOSPITAL_COMMUNITY): Payer: Medicare Other

## 2014-06-18 DIAGNOSIS — G9520 Unspecified cord compression: Secondary | ICD-10-CM

## 2014-06-18 LAB — POCT I-STAT 4, (NA,K, GLUC, HGB,HCT)
GLUCOSE: 199 mg/dL — AB (ref 70–99)
Glucose, Bld: 176 mg/dL — ABNORMAL HIGH (ref 70–99)
HCT: 34 % — ABNORMAL LOW (ref 39.0–52.0)
HEMATOCRIT: 36 % — AB (ref 39.0–52.0)
HEMOGLOBIN: 12.2 g/dL — AB (ref 13.0–17.0)
Hemoglobin: 11.6 g/dL — ABNORMAL LOW (ref 13.0–17.0)
Potassium: 4.4 mEq/L (ref 3.7–5.3)
Potassium: 4.4 mEq/L (ref 3.7–5.3)
Sodium: 134 mEq/L — ABNORMAL LOW (ref 137–147)
Sodium: 135 mEq/L — ABNORMAL LOW (ref 137–147)

## 2014-06-18 LAB — ABO/RH: ABO/RH(D): A POS

## 2014-06-18 LAB — PREPARE RBC (CROSSMATCH)

## 2014-06-18 MED ORDER — DEXAMETHASONE 4 MG PO TABS
4.0000 mg | ORAL_TABLET | Freq: Four times a day (QID) | ORAL | Status: AC
  Administered 2014-06-18 – 2014-06-20 (×8): 4 mg via ORAL
  Filled 2014-06-18 (×9): qty 1

## 2014-06-18 MED ORDER — CEFAZOLIN SODIUM 1-5 GM-% IV SOLN
1.0000 g | Freq: Three times a day (TID) | INTRAVENOUS | Status: AC
  Administered 2014-06-18 (×2): 1 g via INTRAVENOUS
  Filled 2014-06-18 (×2): qty 50

## 2014-06-18 MED ORDER — SODIUM CHLORIDE 0.9 % IV SOLN
Freq: Once | INTRAVENOUS | Status: DC
Start: 2014-06-18 — End: 2014-06-18

## 2014-06-18 MED ORDER — PHENOL 1.4 % MT LIQD
1.0000 | OROMUCOSAL | Status: DC | PRN
  Administered 2014-06-18: 1 via OROMUCOSAL
  Filled 2014-06-18 (×2): qty 177

## 2014-06-18 MED ORDER — ACETAMINOPHEN 325 MG PO TABS: 650.0000 mg | ORAL_TABLET | ORAL | Status: DC | PRN

## 2014-06-18 MED ORDER — ACETAMINOPHEN 650 MG RE SUPP: 650.0000 mg | RECTAL | Status: DC | PRN

## 2014-06-18 MED ORDER — DEXAMETHASONE SODIUM PHOSPHATE 10 MG/ML IJ SOLN
INTRAMUSCULAR | Status: DC | PRN
  Administered 2014-06-18: 10 mg via INTRAVENOUS

## 2014-06-18 MED ORDER — SENNA 8.6 MG PO TABS
1.0000 | ORAL_TABLET | Freq: Two times a day (BID) | ORAL | Status: DC
  Administered 2014-06-18 – 2014-06-20 (×3): 8.6 mg via ORAL
  Filled 2014-06-18 (×6): qty 1

## 2014-06-18 MED ORDER — OXYCODONE-ACETAMINOPHEN 5-325 MG PO TABS
1.0000 | ORAL_TABLET | ORAL | Status: DC | PRN
  Administered 2014-06-18 (×2): 2 via ORAL
  Administered 2014-06-19 (×2): 1 via ORAL
  Administered 2014-06-19: 2 via ORAL
  Administered 2014-06-20 (×2): 1 via ORAL
  Filled 2014-06-18: qty 2
  Filled 2014-06-18: qty 1
  Filled 2014-06-18: qty 2
  Filled 2014-06-18 (×2): qty 1
  Filled 2014-06-18: qty 2
  Filled 2014-06-18: qty 1

## 2014-06-18 MED ORDER — HYDROMORPHONE HCL 1 MG/ML IJ SOLN: 0.2500 mg | INTRAMUSCULAR | Status: DC | PRN

## 2014-06-18 MED ORDER — HYDROCODONE-ACETAMINOPHEN 5-325 MG PO TABS: 1.0000 | ORAL_TABLET | ORAL | Status: DC | PRN

## 2014-06-18 MED ORDER — ONDANSETRON HCL 4 MG/2ML IJ SOLN: 4.0000 mg | Freq: Four times a day (QID) | INTRAMUSCULAR | Status: DC | PRN

## 2014-06-18 MED ORDER — DIAZEPAM 5 MG PO TABS: 5.0000 mg | ORAL_TABLET | Freq: Four times a day (QID) | ORAL | Status: DC | PRN

## 2014-06-18 MED ORDER — LABETALOL HCL 5 MG/ML IV SOLN
INTRAVENOUS | Status: DC | PRN
Start: 2014-06-18 — End: 2014-06-18
  Administered 2014-06-18: 5 mg via INTRAVENOUS

## 2014-06-18 MED ORDER — MORPHINE SULFATE 2 MG/ML IJ SOLN: 1.0000 mg | INTRAMUSCULAR | Status: DC | PRN

## 2014-06-18 MED ORDER — ALBUMIN HUMAN 5 % IV SOLN
INTRAVENOUS | Status: DC | PRN
  Administered 2014-06-18: via INTRAVENOUS

## 2014-06-18 MED ORDER — POLYETHYLENE GLYCOL 3350 17 G PO PACK
17.0000 g | PACK | Freq: Every day | ORAL | Status: DC | PRN
  Filled 2014-06-18: qty 1

## 2014-06-18 MED ORDER — DEXAMETHASONE SODIUM PHOSPHATE 4 MG/ML IJ SOLN
4.0000 mg | Freq: Four times a day (QID) | INTRAMUSCULAR | Status: AC
  Filled 2014-06-18 (×7): qty 1

## 2014-06-18 MED ORDER — ONDANSETRON HCL 4 MG/2ML IJ SOLN: 4.0000 mg | INTRAMUSCULAR | Status: DC | PRN

## 2014-06-18 MED ORDER — MENTHOL 3 MG MT LOZG: 1.0000 | LOZENGE | OROMUCOSAL | Status: DC | PRN

## 2014-06-18 MED ORDER — FENTANYL CITRATE 0.05 MG/ML IJ SOLN
INTRAMUSCULAR | Status: AC
  Filled 2014-06-18: qty 5

## 2014-06-18 MED ORDER — OXYCODONE HCL 5 MG PO TABS: 5.0000 mg | ORAL_TABLET | Freq: Once | ORAL | Status: DC | PRN

## 2014-06-18 MED ORDER — SODIUM CHLORIDE 0.9 % IV SOLN: INTRAVENOUS | Status: DC | PRN

## 2014-06-18 MED ORDER — OXYCODONE HCL 5 MG/5ML PO SOLN
5.0000 mg | Freq: Once | ORAL | Status: DC | PRN
Start: 2014-06-18 — End: 2014-06-18

## 2014-06-18 MED ORDER — DEXAMETHASONE SODIUM PHOSPHATE 4 MG/ML IJ SOLN
INTRAMUSCULAR | Status: AC
  Filled 2014-06-18: qty 3

## 2014-06-18 NOTE — Anesthesia Postprocedure Evaluation (Signed)
Anesthesia Post Note  Patient: Jeremiah Owens  Procedure(s) Performed: Procedure(s) (LRB): Thoracic five laminectomy for epidural tumor resection Thoracic 4-7 posterior lateral arthrodesis, segmental pedicle screw fixation. (N/A)  Anesthesia type: General  Patient location: PACU  Post pain: Pain level controlled and Adequate analgesia  Post assessment: Post-op Vital signs reviewed, Patient's Cardiovascular Status Stable, Respiratory Function Stable, Patent Airway and Pain level controlled  Last Vitals:  Filed Vitals:   06/18/14 0330  BP: 148/72  Pulse: 77  Temp:   Resp: 14    Post vital signs: Reviewed and stable  Level of consciousness: awake, alert  and oriented  Complications: No apparent anesthesia complications

## 2014-06-18 NOTE — Progress Notes (Signed)
PT Cancellation Note  Patient Details Name: Jalon Squier MRN: [id number] DOB: 01/06/1930   Cancelled Treatment:    Reason Eval/Treat Not Completed: Patient not medically ready.  Pt on bedrest until 1545.  Will follow up another time.     Juliya Magill, Thornton Papas 06/18/2014, 9:37 AM

## 2014-06-18 NOTE — Op Note (Signed)
06/17/2014 - 06/18/2014  2:48 AM  PATIENT:  Jeremiah Owens  80 y.o. male with increAsing difficulty walking, due to balance. MRI done 11/5 revealed a pathologic fracture of T5 with significant cord compression.   PRE-OPERATIVE DIAGNOSIS:  Thoracic Tumor  POST-OPERATIVE DIAGNOSIS:  Thoracic Tumor  PROCEDURE:  Procedure(s): Thoracic five laminectomy for epidural tumor resection  Thoracic 4-7 posterior lateral arthrodesis,  segmental pedicle screw fixation T4-T7 Globus instrumentation  SURGEON:Khylon Davies  ASSISTANTS:none  ANESTHESIA:   general  EBL:  Total I/O In: 1829 [I.V.:1400; IV Piggyback:250] Out: 1325 [Urine:925; Blood:400]  BLOOD ADMINISTERED:2 CC PRBC  CELL SAVER GIVEN:none   COUNT:per nursing  DRAINS: none   SPECIMEN:  Source of Specimen:  T5 vertebral body  DICTATION: Mr. Viviano was taken to the operating room, intubated and placed under a general anesthetic without difficulty. He had a foley catheter placed under sterile conditions. Mr. Agena was positioned prone on a Jackson table with all pressure points padded. His back was prepped and draped in a sterile manner. I used fluoroscopy to mark my planned incision. I infiltrated 10cc lidocaine into the upper thoracic region. I made my incision with a 10 blade, taking the incision down to the thoracolumbar fascia. I exposed the lamina of T4,5,6, and 7 bilaterally. I performed a laminectomy of T5 exposing the thecal sac and a dark red/blue soft mass lateral to and anterior to the spinal cord. I started to remove the tumor in piecemeal fashion. The mass was quite vascular and did have have a capsule. By no means did I accomplish a complete a resection of the mass, but I was able to decompress the spinal cord and canal. During the tumor resection I believe the I partially avulsed the left T6 nerve root, as the thecal sac became quite slack, but did not observe spinal fluid in the wound. Continued to remove tumor until  there was no discernible pressure on the cord which was pulsatile.  I then placed the pedicle screws. I used the fluoroscopy in the AP view and placed bone probes in T4, T6, and T7. I tapped each hole bilaterally, but the right T6 screw did not have good purchase so I removed it. I connected the screws with rods, and locking caps, and a cross link.  I decorticated the lamina of the exposed vertebrae and placed allograft to complete the arthrodesis.  I closed the wound in layers approximating the thoracolumbar fascia, subcutaneous, and subcuticular planes. I placed an occlusive dressing on the wound.   PLAN OF CARE: Admit to inpatient   PATIENT DISPOSITION:  PACU - hemodynamically stable.   Delay start of Pharmacological VTE agent (>24hrs) due to surgical blood loss or risk of bleeding:  yes

## 2014-06-18 NOTE — Progress Notes (Signed)
Report received from [facility] PACU nurse. Pt awake, alert, and oriented. Honeycomb dressing to upper back intact with small amt of shadow drainage. Oxygen at 3 liters [place] intact. Statlock placed for foley catheter.Facial edema noted. Periorbital edema noted. Pt with productive cough. Clear sputum. Wife at bedside. No acute distress noted at this time.

## 2014-06-18 NOTE — Progress Notes (Signed)
Patient ID: Jeremiah Owens, male   DOB: 06-Oct-1929, 80 y.o.   MRN: [id number] BP 115/57 mmHg  Pulse 75  Temp(Src) 97.4 F (36.3 C) (Oral)  Resp 10  Ht 5\' 9"  (1.753 m)  Wt 73.3 kg (161 lb 9.6 oz)  BMI 23.85 kg/m2  SpO2 99% Alert and oriented x4, moving all extremities well Wound is clean, dry, no signs of infection Doing well overall.

## 2014-06-18 NOTE — Progress Notes (Signed)
Patient complaining of new onset of tingling in lower extremities. Also noted from 2000 assessment that old drainage on honeycomb is present. Paged Neurosurgery On call. Notified Vertell Limber MD, no orders received. Will continue to monitor at this time.

## 2014-06-18 NOTE — Progress Notes (Signed)
SUBJECTIVE: S/P T5 laminectomy with epidural tumor resection for spinal cord compression. Pain has improved remarkably.   OBJECTIVE PHYSICAL EXAMINATION:  Filed Vitals:   06/18/14 1000  BP: 115/57  Pulse: 75  Temp:   Resp: 10   Filed Weights   06/17/14 2045  Weight: 161 lb 9.6 oz (73.3 kg)    GENERAL:alert, no distress and comfortable SKIN: skin color, texture, turgor are normal, no rashes or significant lesions EYES: normal, Conjunctiva are pink and non-injected, sclera clear OROPHARYNX:secretions that hes suctioning NECK: supple, thyroid normal size, non-tender, without nodularity LUNGS: clear to auscultation  HEART: regular rate & rhythm and no murmurs and no lower extremity edema ABDOMEN:abdomen soft, non-tender and normal bowel sounds NEURO: alert & oriented x 3 with fluent speech, Moves his toes and able to move his extremities  LABORATORY DATA:  I have reviewed the data as listed @LASTCHEMISTRY @  Lab Results  Component Value Date   WBC 4.8 06/17/2014   HGB 11.6* 06/18/2014   HCT 34.0* 06/18/2014   MCV 96.2 06/17/2014   PLT 148* 06/17/2014   NEUTROABS 3.8 06/17/2014    ASSESSMENT AND PLAN: 1. Spinal cord compression: S/P laminectomy. Saw Rad-Onc as well. He will need palliative radiation once he heals from the surgery. We will await pathology result to determine medical oncology treatment plan. I explained to the patient that bone involvement suggests metastatic disease and goals of treatment are palliation and prolongation of life. Treatment would depend on tumor histology. 2. H/O prostate cancer (normal PSA) and melanoma  He will need outpatient PET-CTscan Dr.Mohammed will follow him from Monday. Thanks

## 2014-06-18 NOTE — Transfer of Care (Signed)
Immediate Anesthesia Transfer of Care Note  Patient: Jeremiah Owens  Procedure(s) Performed: Procedure(s): Thoracic five laminectomy for epidural tumor resection Thoracic 4-7 posterior lateral arthrodesis, segmental pedicle screw fixation. (N/A)  Patient Location: PACU and ICU  Anesthesia Type:General  Level of Consciousness: sedated  Airway & Oxygen Therapy: Patient Spontanous Breathing and Patient connected to nasal cannula oxygen  Post-op Assessment: Report given to PACU RN Jamse Belfast ...recover pt in Neuro ICU  Post vital signs: Reviewed and stable  Complications: No apparent anesthesia complications

## 2014-06-18 NOTE — Plan of Care (Signed)
Problem: Consults Goal: Skin Care Protocol Initiated - if Braden Score 18 or less If consults are not indicated, leave blank or document N/A Outcome: Completed/Met Date Met:  06/18/14 Goal: Nutrition Consult-if indicated Outcome: Not Applicable Date Met:  36/85/99 Goal: Diabetes Guidelines if Diabetic/Glucose > 140 If diabetic or lab glucose is > 140 mg/dl - Initiate Diabetes/Hyperglycemia Guidelines & Document Interventions  Outcome: Not Applicable Date Met:  23/41/44  Problem: Phase I Progression Outcomes Goal: Pain controlled with appropriate interventions Outcome: Completed/Met Date Met:  06/18/14 Goal: Tubes/drains patent Outcome: Completed/Met Date Met:  06/18/14 Goal: Vital signs/hemodynamically stable Outcome: Completed/Met Date Met:  06/18/14 Goal: Other Phase I Outcomes/Goals Outcome: Not Applicable Date Met:  36/01/65

## 2014-06-19 ENCOUNTER — Ambulatory Visit: Payer: Medicare Other | Admitting: Radiation Oncology

## 2014-06-19 ENCOUNTER — Encounter: Payer: Self-pay | Admitting: Radiation Oncology

## 2014-06-19 DIAGNOSIS — Z8546 Personal history of malignant neoplasm of prostate: Secondary | ICD-10-CM

## 2014-06-19 DIAGNOSIS — M899 Disorder of bone, unspecified: Secondary | ICD-10-CM

## 2014-06-19 DIAGNOSIS — Z85820 Personal history of malignant melanoma of skin: Secondary | ICD-10-CM

## 2014-06-19 DIAGNOSIS — C801 Malignant (primary) neoplasm, unspecified: Secondary | ICD-10-CM

## 2014-06-19 NOTE — Progress Notes (Signed)
Inpatient Rehabilitation  Patient was screened by Pamla Pangle for appropriateness for an Inpatient Acute Rehab consult.  At this time, we are recommending Inpatient Rehab consult.  Please order when you feel appropriate.   Caileigh Canche PT Inpatient Rehab Admissions Coordinator Cell [phone] Office [phone]   

## 2014-06-19 NOTE — Evaluation (Signed)
Physical Therapy Evaluation Patient Details Name: Jeremiah Owens MRN: [id number] DOB: [birth year] Today's Date: 06/19/2014   History of Present Illness  Adm 06/17/14 with T5 fracture and spinal cord compression due to T5 tumor. 11/15 T4-7 laminectormy, tumor resection, and arthrodesis. PMHx- melanoma, prostate Ca  Clinical Impression  Patient is s/p above surgery resulting in functional limitations due to the deficits listed below (see PT Problem List). Pt very motivated with recent significant decline in functional mobility. Patient will benefit from skilled PT to increase their independence and safety with mobility to allow discharge to the venue listed below.       Follow Up Recommendations CIR    Equipment Recommendations   (TBA)    Recommendations for Other Services Rehab consult     Precautions / Restrictions Precautions Precautions: Fall;Back Precaution Booklet Issued: No      Mobility  Bed Mobility Overal bed mobility: Needs Assistance Bed Mobility: Rolling;Sidelying to Sit Rolling: Supervision Sidelying to sit: Mod assist       General bed mobility comments: roll with rail with vc; assist to raise trunk and maintain balance as coming to sit (leans posterior)  Transfers Overall transfer level: Needs assistance Equipment used: 2 person hand held assist Transfers: Sit to/from Stand;Stand Pivot Transfers Sit to Stand: Min assist;+2 physical assistance;From elevated surface Stand pivot transfers: Mod assist;+2 physical assistance;From elevated surface       General transfer comment: to stand x 2; pt self-selects wide BOS and able to "shimmy" his feet closer together; when pivoting to chair, pt's decr coordination ?proprioception caused near scissoring and difficulty placing feet  Ambulation/Gait                Stairs            Wheelchair Mobility    Modified Rankin (Stroke Patients Only)       Balance Overall balance assessment: Needs  assistance Sitting-balance support: Bilateral upper extremity supported;Feet supported Sitting balance-Leahy Scale: Poor Sitting balance - Comments: loses balance posteriorly without righting reactions; can identify direction, but requires cues and assist to correct   Standing balance support: Bilateral upper extremity supported Standing balance-Leahy Scale: Poor                               Pertinent Vitals/Pain Pain Assessment: 0-10 Pain Score: 4  Pain Location: mid back Pain Intervention(s): Limited activity within patient's tolerance;Monitored during session;Repositioned    Home Living Family/patient expects to be discharged to:: Inpatient rehab                 Additional Comments: lives with wife; has RW with seat;     Prior Function Level of Independence: Needs assistance   Gait / Transfers Assistance Needed: declining ambulation PTA; had begun to use RW a few days PTA           Hand Dominance   Dominant Hand: Right    Extremity/Trunk Assessment   Upper Extremity Assessment: Defer to OT evaluation           Lower Extremity Assessment: RLE deficits/detail;LLE deficits/detail RLE Deficits / Details: AROM WFL; knee extension 4+/5, DF 4+/5  LLE Deficits / Details: AROM WFL; knee extension 4/5, DF 4/5  Cervical / Trunk Assessment: Kyphotic  Communication   Communication: HOH (has hearing aids)  Cognition Arousal/Alertness: Awake/alert Behavior During Therapy: WFL for tasks assessed/performed Overall Cognitive Status: Within Functional Limits for tasks assessed  General Comments      Exercises        Assessment/Plan    PT Assessment Patient needs continued PT services  PT Diagnosis Abnormality of gait   PT Problem List Decreased strength;Decreased activity tolerance;Decreased balance;Decreased mobility;Decreased coordination;Decreased knowledge of use of DME;Decreased knowledge of precautions;Impaired  sensation;Pain  PT Treatment Interventions DME instruction;Gait training;Functional mobility training;Therapeutic activities;Therapeutic exercise;Balance training;Neuromuscular re-education;Patient/family education   PT Goals (Current goals can be found in the Care Plan section) Acute Rehab PT Goals Patient Stated Goal: to walk PT Goal Formulation: With patient Time For Goal Achievement: 06/26/14 Potential to Achieve Goals: Good    Frequency Min 5X/week   Barriers to discharge        Co-evaluation PT/OT/SLP Co-Evaluation/Treatment: Yes Reason for Co-Treatment: Complexity of the patient's impairments (multi-system involvement);For patient/therapist safety PT goals addressed during session: Mobility/safety with mobility;Balance         End of Session Equipment Utilized During Treatment: Gait belt Activity Tolerance: Patient tolerated treatment well Patient left: in chair;with call bell/phone within reach Nurse Communication: Mobility status         Time: 6004-5997 PT Time Calculation (min) (ACUTE ONLY): 32 min   Charges:   PT Evaluation $Initial PT Evaluation Tier I: 1 Procedure     PT G Codes:          Tyquisha Sharps 27-Jun-2014, 4:00 PM Pager [phone]

## 2014-06-19 NOTE — Progress Notes (Signed)
Subjective: The patient is seen and examined today. His wife and son were at the bedside. He recently underwent T5 laminectomy for epidural tumor resection was T4-7 posterior lateral arthrodesis. He is feeling much better today and recovering well from his recent surgery. He has better movement in the lower extremities. His back pain has significantly improved. The patient denied having any significant fever or chills. He has no nausea or vomiting. The patient denied having any significant chest pain, shortness breath, cough or hemoptysis. The final pathology is still pending.   Objective: Vital signs in last 24 hours: Temp:  [97.8 F (36.6 C)-98.5 F (36.9 C)] 98.3 F (36.8 C) (11/16 2000) Pulse Rate:  [82-100] 95 (11/16 2000) Resp:  [3-21] 15 (11/16 2000) BP: (111-147)/(52-73) 115/73 mmHg (11/16 2000) SpO2:  [93 %-99 %] 97 % (11/16 2000)  Intake/Output from previous day: 11/15 0701 - 11/16 0700 In: 3590 [P.O.:1620; I.V.:1920; IV Piggyback:50] Out: 8299 [Urine:3258] Intake/Output this shift: Total I/O In: 170 [I.V.:80; Other:90] Out: -   General appearance: alert, cooperative, fatigued and no distress Resp: clear to auscultation bilaterally Cardio: regular rate and rhythm, S1, S2 normal, no murmur, click, rub or gallop GI: soft, non-tender; bowel sounds normal; no masses,  no organomegaly Extremities: extremities normal, atraumatic, no cyanosis or edema  Lab Results:   Recent Labs  06/17/14 1638 06/17/14 2203 06/17/14 2356 06/18/14 0028  WBC 5.8 4.8  --   --   HGB 13.9 13.1 12.2* 11.6*  HCT 40.5 38.1* 36.0* 34.0*  PLT 167 148*  --   --    BMET  Recent Labs  06/17/14 1638 06/17/14 2203 06/17/14 2356 06/18/14 0028  NA 139 131* 135* 134*  K 4.8 4.0 4.4 4.4  CL 100 96  --   --   CO2 25 25  --   --   GLUCOSE 113* 122* 176* 199*  BUN 22 18  --   --   CREATININE 1.01 0.84  --   --   CALCIUM 9.8 9.2  --   --     Studies/Results: Dg Thoracic Spine 2  View  06/18/2014   CLINICAL DATA:  Thoracic fusion  EXAM: DG C-ARM 61-120 MIN; THORACIC SPINE - 2 VIEW  FLUOROSCOPY TIME:  57 seconds  COMPARISON:  None  FINDINGS: Three level upper posterior thoracic spine fusion. There is thoracic spine spondylosis.  IMPRESSION: Three level upper posterior thoracic spine fusion.   Electronically Signed   By: Kathreen Devoid   On: 06/18/2014 04:10   Dg C-arm 61-120 Min  06/18/2014   CLINICAL DATA:  Thoracic fusion  EXAM: DG C-ARM 61-120 MIN; THORACIC SPINE - 2 VIEW  FLUOROSCOPY TIME:  57 seconds  COMPARISON:  None  FINDINGS: Three level upper posterior thoracic spine fusion. There is thoracic spine spondylosis.  IMPRESSION: Three level upper posterior thoracic spine fusion.   Electronically Signed   By: Kathreen Devoid   On: 06/18/2014 04:10    Medications: I have reviewed the patient's current medications.   Assessment/Plan: This is a very pleasant 80 years old white male with history of prostate cancer as well as history of malignant melanoma more than 20 years ago. He presented with metastatic bone lesion and T5 cord compression, status post laminectomy and removal of the epidural tumor.  The patient is feeling much better today and recovering well from his surgery. I will await for the final pathology before giving further recommendation regarding treatment of his condition. The patient may benefit from palliative  radiotherapy to the metastatic bone lesions after healing from his surgery. He was seen by Dr. Lisbeth Renshaw. He would also benefit from physical therapy and rehabilitation. He is scheduled to have a PET scan later this week but I'm not sure if the patient will be outpatient by that time. If he stayed in the hospital, I will reschedule his PET scan. Thank you so much for taking good care of Jeremiah Owens, I will continue to follow up the patient with you and assist in his management on as-needed basis.   LOS: 2 days    Kaylyn Garrow K. 06/19/2014

## 2014-06-19 NOTE — Clinical Social Work Note (Signed)
Clinical Social Worker received referral for possible ST-SNF placement.  Chart reviewed.  PT/OT unable to work with patient this morning due to strict bed rest orders.  CSW awaiting PT/OT evaluation and recommendations to determine appropriate venue at discharge.  CSW available for support as needed.  Barbette Or, [place]

## 2014-06-19 NOTE — Progress Notes (Signed)
Patient ID: Jeremiah Owens, male   DOB: 12-25-1929, 80 y.o.   MRN: [id number] BP 120/57 mmHg  Pulse 95  Temp(Src) 98.2 F (36.8 C) (Oral)  Resp 7  Ht 5\' 9"  (1.753 m)  Wt 73.3 kg (161 lb 9.6 oz)  BMI 23.85 kg/m2  SpO2 95% Alert and oriented x 4, speech is clear and fluent Moving lower extremities well Wound is clean and dry. Will order inpatient rehab

## 2014-06-19 NOTE — Evaluation (Signed)
Occupational Therapy Evaluation Patient Details Name: Jeremiah Owens MRN: [id number] DOB: 11/04/1929 Today's Date: 06/19/2014    History of Present Illness Adm 06/17/14 with T5 fracture and spinal cord compression due to T5 tumor. 11/15 T4-7 laminectormy, tumor resection, and arthrodesis. PMHx- melanoma, prostate Ca   Clinical Impression   This 80 yo male admitted and underwent above presents to acute OT with decreased balance, decreased mobility, decreased strength in Bil LEs, decreased proprioception (sitting and standing), and increased pain all affecting his ability to care for himself and increasing burden of care on family. He will benefit from acute OT with follow up on CIR to get to a S or better level to return home with wife.     Follow Up Recommendations  CIR    Equipment Recommendations   (TBD at next venue)       Precautions / Restrictions Precautions Precautions: Fall;Back Precaution Booklet Issued: No      Mobility Bed Mobility Overal bed mobility: Needs Assistance Bed Mobility: Rolling;Sidelying to Sit Rolling: Supervision Sidelying to sit: Mod assist       General bed mobility comments: roll with rail with vc; assist to raise trunk and maintain balance as coming to sit (leans posterior)  Transfers Overall transfer level: Needs assistance Equipment used: 2 person hand held assist Transfers: Sit to/from Stand;Stand Pivot Transfers Sit to Stand: Min assist;+2 physical assistance;From elevated surface Stand pivot transfers: Mod assist;+2 physical assistance;From elevated surface       General transfer comment: to stand x 2; pt self-selects wide BOS and able to "shimmy" his feet closer together; when pivoting to chair, pt's decr coordination ?proprioception caused near scissoring and difficulty placing feet    Balance Overall balance assessment: Needs assistance Sitting-balance support: Bilateral upper extremity supported;Feet supported Sitting  balance-Leahy Scale: Poor Sitting balance - Comments: loses balance posteriorly without righting reactions; can identify direction, but requires cues and assist to correct   Standing balance support: Bilateral upper extremity supported Standing balance-Leahy Scale: Poor                              ADL Overall ADL's : Needs assistance/impaired Eating/Feeding: Independent;Sitting   Grooming: Set up;Sitting   Upper Body Bathing: Set up;Sitting   Lower Body Bathing: Moderate assistance (with +2 min A sit<>stand from raised surface)   Upper Body Dressing : Set up;Sitting   Lower Body Dressing: Maximal assistance (with +2 min A sit<>stand from raised surface)   Toilet Transfer: +2 for physical assistance;Moderate assistance (bil HHA from raised bed to recliner on pt's left)   Toileting- Clothing Manipulation and Hygiene: Total assistance (with +2 min A sit<>stand from raised surface)                         Pertinent Vitals/Pain Pain Assessment: 0-10 Pain Score: 4  Pain Location: mid back Pain Descriptors / Indicators: Aching Pain Intervention(s): Limited activity within patient's tolerance;Monitored during session     Hand Dominance Right   Extremity/Trunk Assessment Upper Extremity Assessment Upper Extremity Assessment: Overall WFL for tasks assessed (except left should only about 20 degrees of shoulder flexion)   Lower Extremity Assessment Lower Extremity Assessment: RLE deficits/detail;LLE deficits/detail RLE Deficits / Details: AROM WFL; knee extension 4+/5, DF 4+/5  RLE Sensation:  (denies numbness; tingling all toes) RLE Coordination: decreased gross motor (decr heel to shin) LLE Deficits / Details: AROM WFL; knee extension 4/5, DF 4/5 LLE  Sensation:  (denies numbness; tingling all toes) LLE Coordination: decreased gross motor (decr heel to shin)   Cervical / Trunk Assessment Cervical / Trunk Assessment: Kyphotic   Communication  Communication Communication: HOH (has hearing aids)   Cognition Arousal/Alertness: Awake/alert Behavior During Therapy: WFL for tasks assessed/performed Overall Cognitive Status: Within Functional Limits for tasks assessed                                Home Living Family/patient expects to be discharged to:: Inpatient rehab                                 Additional Comments: lives with wife; has RW with seat;       Prior Functioning/Environment Level of Independence: Needs assistance  Gait / Transfers Assistance Needed: declining ambulation PTA; had begun to use RW a few days PTA          OT Diagnosis: Generalized weakness;Acute pain   OT Problem List: Decreased strength;Decreased range of motion;Impaired balance (sitting and/or standing);Pain;Decreased knowledge of use of DME or AE   OT Treatment/Interventions: Self-care/ADL training;Therapeutic exercise;Therapeutic activities;DME and/or AE instruction;Balance training;Patient/family education    OT Goals(Current goals can be found in the care plan section) Acute Rehab OT Goals Patient Stated Goal: to walk OT Goal Formulation: With patient Time For Goal Achievement: 06/26/14 Potential to Achieve Goals: Good  OT Frequency: Min 2X/week (3 if time)           Co-evaluation PT/OT/SLP Co-Evaluation/Treatment: Yes (partial) Reason for Co-Treatment: Complexity of the patient's impairments (multi-system involvement);For patient/therapist safety PT goals addressed during session: Mobility/safety with mobility;Balance OT goals addressed during session: ADL's and self-care;Strengthening/ROM      End of Session Equipment Utilized During Treatment: Gait belt Nurse Communication: Mobility status (+2 standpivot with bed and recliner next to each other)  Activity Tolerance: Patient tolerated treatment well Patient left: in chair;with call bell/phone within reach   Time: 1500-1524 OT Time Calculation  (min): 24 min Charges:  OT General Charges $OT Visit: 1 Procedure OT Evaluation $Initial OT Evaluation Tier I: 1 Procedure  Almon Register [phone] 06/19/2014, 4:14 PM

## 2014-06-19 NOTE — Progress Notes (Signed)
PT Cancellation Note  Patient Details Name: Jeremiah Owens MRN: [id number] DOB: 1930-07-15   Cancelled Treatment:    Reason Eval/Treat Not Completed: Patient not medically ready. Noted now on strict bedrest.    Ellaina Schuler 06/19/2014, 8:36 AM Pager [phone]

## 2014-06-20 ENCOUNTER — Inpatient Hospital Stay (HOSPITAL_COMMUNITY)
Admission: RE | Admit: 2014-06-20 | Discharge: 2014-07-07 | DRG: 543 | Disposition: A | Discharge status: Home / Self Care | Payer: Medicare Other | Source: Intra-hospital | Attending: Physical Medicine & Rehabilitation | Admitting: Physical Medicine & Rehabilitation

## 2014-06-20 ENCOUNTER — Encounter (HOSPITAL_COMMUNITY): Payer: Self-pay | Admitting: Neurosurgery

## 2014-06-20 DIAGNOSIS — C419 Malignant neoplasm of bone and articular cartilage, unspecified: Secondary | ICD-10-CM

## 2014-06-20 DIAGNOSIS — C61 Malignant neoplasm of prostate: Secondary | ICD-10-CM | POA: Diagnosis not present

## 2014-06-20 DIAGNOSIS — C9000 Multiple myeloma not having achieved remission: Secondary | ICD-10-CM | POA: Diagnosis present

## 2014-06-20 DIAGNOSIS — C7951 Secondary malignant neoplasm of bone: Secondary | ICD-10-CM | POA: Diagnosis present

## 2014-06-20 DIAGNOSIS — G8220 Paraplegia, unspecified: Secondary | ICD-10-CM | POA: Diagnosis not present

## 2014-06-20 DIAGNOSIS — E039 Hypothyroidism, unspecified: Secondary | ICD-10-CM | POA: Diagnosis present

## 2014-06-20 DIAGNOSIS — R531 Weakness: Secondary | ICD-10-CM | POA: Diagnosis present

## 2014-06-20 DIAGNOSIS — N400 Enlarged prostate without lower urinary tract symptoms: Secondary | ICD-10-CM | POA: Diagnosis present

## 2014-06-20 DIAGNOSIS — D492 Neoplasm of unspecified behavior of bone, soft tissue, and skin: Secondary | ICD-10-CM | POA: Diagnosis not present

## 2014-06-20 DIAGNOSIS — C439 Malignant melanoma of skin, unspecified: Secondary | ICD-10-CM | POA: Diagnosis not present

## 2014-06-20 DIAGNOSIS — D62 Acute posthemorrhagic anemia: Secondary | ICD-10-CM

## 2014-06-20 DIAGNOSIS — D4989 Neoplasm of unspecified behavior of other specified sites: Secondary | ICD-10-CM | POA: Diagnosis not present

## 2014-06-20 DIAGNOSIS — C7931 Secondary malignant neoplasm of brain: Secondary | ICD-10-CM

## 2014-06-20 DIAGNOSIS — R609 Edema, unspecified: Secondary | ICD-10-CM | POA: Diagnosis not present

## 2014-06-20 DIAGNOSIS — D649 Anemia, unspecified: Secondary | ICD-10-CM | POA: Diagnosis not present

## 2014-06-20 LAB — TYPE AND SCREEN
ABO/RH(D): A POS
Antibody Screen: NEGATIVE
UNIT DIVISION: 0
UNIT DIVISION: 0
Unit division: 0
Unit division: 0
Unit division: 0
Unit division: 0

## 2014-06-20 MED ORDER — ACETAMINOPHEN 325 MG PO TABS
325.0000 mg | ORAL_TABLET | ORAL | Status: DC | PRN
  Administered 2014-06-29: 650 mg via ORAL
  Administered 2014-06-29: 325 mg via ORAL
  Administered 2014-07-06: 650 mg via ORAL
  Filled 2014-06-20: qty 1
  Filled 2014-06-20 (×2): qty 2

## 2014-06-20 MED ORDER — HYDROCORTISONE 1 % EX CREA
TOPICAL_CREAM | Freq: Every day | CUTANEOUS | Status: DC | PRN
  Filled 2014-06-20: qty 28

## 2014-06-20 MED ORDER — METHOCARBAMOL 500 MG PO TABS
500.0000 mg | ORAL_TABLET | Freq: Four times a day (QID) | ORAL | Status: DC | PRN
  Administered 2014-06-22 – 2014-07-01 (×7): 500 mg via ORAL
  Filled 2014-06-20 (×7): qty 1

## 2014-06-20 MED ORDER — CENTRUM SILVER PO TABS: 1.0000 | ORAL_TABLET | Freq: Every day | ORAL | Status: DC

## 2014-06-20 MED ORDER — ADULT MULTIVITAMIN W/MINERALS CH
1.0000 | ORAL_TABLET | Freq: Every day | ORAL | Status: DC
  Filled 2014-06-20: qty 1

## 2014-06-20 MED ORDER — VITAMIN D3 25 MCG (1000 UNIT) PO TABS
1000.0000 [IU] | ORAL_TABLET | Freq: Every day | ORAL | Status: DC
  Administered 2014-06-20 – 2014-07-06 (×17): 1000 [IU] via ORAL
  Filled 2014-06-20 (×18): qty 1

## 2014-06-20 MED ORDER — SENNA 8.6 MG PO TABS
1.0000 | ORAL_TABLET | Freq: Two times a day (BID) | ORAL | Status: DC
  Administered 2014-06-20: 8.6 mg via ORAL
  Filled 2014-06-20 (×2): qty 1

## 2014-06-20 MED ORDER — LEVOTHYROXINE SODIUM 25 MCG PO TABS
25.0000 ug | ORAL_TABLET | Freq: Every day | ORAL | Status: DC
  Administered 2014-06-21 – 2014-07-07 (×17): 25 ug via ORAL
  Filled 2014-06-20 (×18): qty 1

## 2014-06-20 MED ORDER — POLYETHYLENE GLYCOL 3350 17 G PO PACK
17.0000 g | PACK | Freq: Every day | ORAL | Status: DC | PRN
  Administered 2014-06-24: 17 g via ORAL
  Filled 2014-06-20 (×2): qty 1

## 2014-06-20 MED ORDER — METRONIDAZOLE 0.75 % EX CREA
TOPICAL_CREAM | CUTANEOUS | Status: DC | PRN
  Filled 2014-06-20: qty 45

## 2014-06-20 MED ORDER — ONDANSETRON HCL 4 MG/2ML IJ SOLN: 4.0000 mg | Freq: Four times a day (QID) | INTRAMUSCULAR | Status: DC | PRN

## 2014-06-20 MED ORDER — HYDROCODONE-ACETAMINOPHEN 5-325 MG PO TABS
1.0000 | ORAL_TABLET | ORAL | Status: DC | PRN
  Administered 2014-06-20 – 2014-06-22 (×6): 1 via ORAL
  Administered 2014-06-23: 2 via ORAL
  Administered 2014-06-23 – 2014-06-30 (×17): 1 via ORAL
  Administered 2014-07-01: 2 via ORAL
  Administered 2014-07-01 (×2): 1 via ORAL
  Administered 2014-07-02: 2 via ORAL
  Administered 2014-07-02 – 2014-07-03 (×2): 1 via ORAL
  Administered 2014-07-03 (×2): 2 via ORAL
  Administered 2014-07-04 (×2): 1 via ORAL
  Administered 2014-07-04 – 2014-07-05 (×3): 2 via ORAL
  Administered 2014-07-05 – 2014-07-06 (×2): 1 via ORAL
  Administered 2014-07-06 – 2014-07-07 (×3): 2 via ORAL
  Filled 2014-06-20: qty 2
  Filled 2014-06-20 (×8): qty 1
  Filled 2014-06-20: qty 2
  Filled 2014-06-20 (×4): qty 1
  Filled 2014-06-20: qty 2
  Filled 2014-06-20 (×3): qty 1
  Filled 2014-06-20 (×2): qty 2
  Filled 2014-06-20: qty 1
  Filled 2014-06-20 (×2): qty 2
  Filled 2014-06-20 (×2): qty 1
  Filled 2014-06-20: qty 2
  Filled 2014-06-20 (×3): qty 1
  Filled 2014-06-20: qty 2
  Filled 2014-06-20: qty 1
  Filled 2014-06-20 (×2): qty 2
  Filled 2014-06-20: qty 1
  Filled 2014-06-20: qty 2
  Filled 2014-06-20: qty 1
  Filled 2014-06-20: qty 2
  Filled 2014-06-20 (×2): qty 1
  Filled 2014-06-20: qty 2
  Filled 2014-06-20 (×3): qty 1

## 2014-06-20 MED ORDER — FAMOTIDINE 20 MG PO TABS
20.0000 mg | ORAL_TABLET | Freq: Every day | ORAL | Status: DC
  Administered 2014-06-20 – 2014-07-06 (×17): 20 mg via ORAL
  Filled 2014-06-20 (×18): qty 1

## 2014-06-20 MED ORDER — ONDANSETRON HCL 4 MG PO TABS: 4.0000 mg | ORAL_TABLET | Freq: Four times a day (QID) | ORAL | Status: DC | PRN

## 2014-06-20 MED ORDER — BISACODYL 10 MG RE SUPP: 10.0000 mg | Freq: Every day | RECTAL | Status: DC | PRN

## 2014-06-20 MED ORDER — METRONIDAZOLE 0.75 % EX GEL
Freq: Every day | CUTANEOUS | Status: DC | PRN
  Filled 2014-06-20: qty 45

## 2014-06-20 MED ORDER — SORBITOL 70 % SOLN: 30.0000 mL | Freq: Every day | Status: DC | PRN

## 2014-06-20 MED ORDER — TAMSULOSIN HCL 0.4 MG PO CAPS
0.4000 mg | ORAL_CAPSULE | Freq: Every day | ORAL | Status: DC
  Administered 2014-06-20 – 2014-07-06 (×17): 0.4 mg via ORAL
  Filled 2014-06-20 (×18): qty 1

## 2014-06-20 MED ORDER — POLYVINYL ALCOHOL 1.4 % OP SOLN
1.0000 [drp] | OPHTHALMIC | Status: DC | PRN
  Filled 2014-06-20: qty 15

## 2014-06-20 NOTE — Plan of Care (Signed)
Problem: Consults Goal: General Surgical Patient Education (See Patient Education module for education specifics)  Outcome: Completed/Met Date Met:  06/20/14

## 2014-06-20 NOTE — Plan of Care (Signed)
Problem: Phase II Progression Outcomes Goal: Return of bowel function (flatus, BM) IF ABDOMINAL SURGERY:  Outcome: Completed/Met Date Met:  06/20/14

## 2014-06-20 NOTE — Plan of Care (Signed)
Problem: Phase II Progression Outcomes Goal: Surgical site without signs of infection Outcome: Completed/Met Date Met:  06/20/14

## 2014-06-20 NOTE — Plan of Care (Signed)
Problem: Phase II Progression Outcomes Goal: Progressing with IS, TCDB Outcome: Completed/Met Date Met:  06/20/14

## 2014-06-20 NOTE — Plan of Care (Signed)
Problem: Discharge Progression Outcomes Goal: Tolerating diet Outcome: Completed/Met Date Met:  06/20/14

## 2014-06-20 NOTE — Progress Notes (Signed)
Late entry: Pt transferred to Rehab from [facility]. Alert and orientated x 4. Diagnostic specific information provided. Pt and family educated regarding rehab schedule, expectations, etc. All questions answered. Pt and family looking forward to therapy in the morning.

## 2014-06-20 NOTE — Plan of Care (Signed)
Problem: Discharge Progression Outcomes Goal: Pain controlled with appropriate interventions Outcome: Completed/Met Date Met:  06/20/14

## 2014-06-20 NOTE — Plan of Care (Signed)
Problem: Phase II Progression Outcomes Goal: Pain controlled Outcome: Completed/Met Date Met:  06/20/14

## 2014-06-20 NOTE — Discharge Instructions (Signed)

## 2014-06-20 NOTE — Plan of Care (Signed)
Problem: Discharge Progression Outcomes Goal: Barriers To Progression Addressed/Resolved Outcome: Completed/Met Date Met:  06/20/14     

## 2014-06-20 NOTE — Plan of Care (Signed)
Problem: Phase III Progression Outcomes Goal: Other Phase III Outcomes/Goals Outcome: Completed/Met Date Met:  06/20/14

## 2014-06-20 NOTE — Plan of Care (Signed)
Problem: Phase I Progression Outcomes Goal: Sutures/staples intact Outcome: Completed/Met Date Met:  06/20/14     

## 2014-06-20 NOTE — Plan of Care (Signed)
Problem: Discharge Progression Outcomes Goal: Steri-Strips applied Outcome: Completed/Met Date Met:  06/20/14

## 2014-06-20 NOTE — Plan of Care (Signed)
Problem: Discharge Progression Outcomes Goal: Staples/sutures removed Outcome: Completed/Met Date Met:  06/20/14

## 2014-06-20 NOTE — Plan of Care (Signed)
Problem: Phase II Progression Outcomes Goal: Tolerating diet Outcome: Completed/Met Date Met:  06/20/14

## 2014-06-20 NOTE — Plan of Care (Signed)
Problem: Phase III Progression Outcomes Goal: Activity at appropriate level-compared to baseline (UP IN CHAIR FOR HEMODIALYSIS)  Outcome: Completed/Met Date Met:  06/20/14

## 2014-06-20 NOTE — Plan of Care (Signed)
Problem: Phase II Progression Outcomes Goal: Vital signs stable Outcome: Completed/Met Date Met:  06/20/14

## 2014-06-20 NOTE — Plan of Care (Signed)
Problem: Phase III Progression Outcomes Goal: Demonstrates TCDB, IS independently Outcome: Completed/Met Date Met:  06/20/14

## 2014-06-20 NOTE — Plan of Care (Signed)
Problem: Phase II Progression Outcomes Goal: Foley discontinued Outcome: Completed/Met Date Met:  06/20/14     

## 2014-06-20 NOTE — Plan of Care (Signed)
Problem: Phase II Progression Outcomes Goal: Discharge plan established Outcome: Completed/Met Date Met:  06/20/14

## 2014-06-20 NOTE — Plan of Care (Signed)
Problem: Discharge Progression Outcomes Goal: Other Discharge Outcomes/Goals Outcome: Completed/Met Date Met:  06/20/14

## 2014-06-20 NOTE — Plan of Care (Signed)
Problem: Phase III Progression Outcomes Goal: IV changed to normal saline lock Outcome: Completed/Met Date Met:  06/20/14     

## 2014-06-20 NOTE — Plan of Care (Signed)
Problem: Phase I Progression Outcomes Goal: Initial discharge plan identified Outcome: Completed/Met Date Met:  06/20/14     

## 2014-06-20 NOTE — Progress Notes (Signed)
Physical Therapy Treatment Patient Details Name: Jeremiah Owens MRN: [id number] DOB: 1929/11/03 Today's Date: 06/20/2014    History of Present Illness Adm 06/17/14 with T5 fracture and spinal cord compression due to T5 tumor. 11/15 T4-7 laminectormy, tumor resection, and arthrodesis. PMHx- melanoma, prostate Ca    PT Comments    Pt eager for mobility and for move to rehab.  Pt with improved ability to come to stand today, though continues to have difficulty coordinating the movement.  Still feel pt most appropriate for CIR at D/C.  Will continue to follow.    Follow Up Recommendations  CIR     Equipment Recommendations   (TBD)    Recommendations for Other Services       Precautions / Restrictions Precautions Precautions: Fall;Back Precaution Booklet Issued: No Precaution Comments: Reviewed back precautions Restrictions Weight Bearing Restrictions: No    Mobility  Bed Mobility               General bed mobility comments: pt sitting in recliner.  Transfers Overall transfer level: Needs assistance Equipment used: Rolling walker (2 wheeled) Transfers: Sit to/from Stand Sit to Stand: Mod assist         General transfer comment: Worked on pt coming to stand from recliner and using armrests.  pt stood x5 with 2 of the trials pt was MinA.  pt with decreased coordination in LEs and needs UE support to maintain balance and standing position.    Ambulation/Gait                 Stairs            Wheelchair Mobility    Modified Rankin (Stroke Patients Only)       Balance Overall balance assessment: Needs assistance Sitting-balance support: Bilateral upper extremity supported;Feet supported Sitting balance-Leahy Scale: Poor     Standing balance support: Bilateral upper extremity supported;During functional activity Standing balance-Leahy Scale: Poor Standing balance comment: pt able to stand statically with Bil UE support and MinG.  pt unable to  maintain balance with only single UE support.                      Cognition Arousal/Alertness: Awake/alert Behavior During Therapy: WFL for tasks assessed/performed Overall Cognitive Status: Within Functional Limits for tasks assessed                      Exercises General Exercises - Lower Extremity Ankle Circles/Pumps: AROM;Both;15 reps Long Arc Quad: AROM;Both;15 reps Hip Flexion/Marching: AROM;Both;15 reps    General Comments        Pertinent Vitals/Pain Pain Assessment: 0-10 Pain Score: 5  Pain Location: Mid back and incision Pain Descriptors / Indicators: Aching Pain Intervention(s): Monitored during session;Repositioned;Patient requesting pain meds-RN notified    Home Living   Living Arrangements: Spouse/significant other Available Help at Discharge: Family;Available 24 hours/day Type of Home: House Home Access: Stairs to enter Entrance Stairs-Rails: None Home Layout: One level   Additional Comments: moved to this home 10 years ago. bathroom fully hadicapped shower. Son havng grab bars installed throughout bathroom    Prior Function            PT Goals (current goals can now be found in the care plan section) Acute Rehab PT Goals Patient Stated Goal: to walk PT Goal Formulation: With patient Time For Goal Achievement: 06/26/14 Potential to Achieve Goals: Good Progress towards PT goals: Progressing toward goals    Frequency  Min 5X/week  PT Plan Current plan remains appropriate    Co-evaluation             End of Session Equipment Utilized During Treatment: Gait belt Activity Tolerance: Patient tolerated treatment well Patient left: in chair;with call bell/phone within reach     Time: 1359-1430 PT Time Calculation (min) (ACUTE ONLY): 31 min  Charges:  $Therapeutic Activity: 23-37 mins                    G CodesCatarina Owens, [place] 06/20/2014, 3:03 PM

## 2014-06-20 NOTE — Plan of Care (Signed)
Problem: Discharge Progression Outcomes Goal: Complications resolved/controlled Outcome: Completed/Met Date Met:  06/20/14

## 2014-06-20 NOTE — Plan of Care (Signed)
Problem: Phase II Progression Outcomes Goal: Other Phase II Outcomes/Goals Outcome: Completed/Met Date Met:  06/20/14

## 2014-06-20 NOTE — Plan of Care (Signed)
Problem: Phase II Progression Outcomes Goal: Progress activity as tolerated unless otherwise ordered Outcome: Completed/Met Date Met:  06/20/14

## 2014-06-20 NOTE — Discharge Summary (Signed)
  Physician Discharge Summary  Patient ID: Jeremiah Owens MRN: [id number] DOB/AGE: 11/16/1929 80 y.o.  Admit date: 06/17/2014 Discharge date: 06/20/2014  Admission Diagnoses:thoracic tumor with spinal cord compression t5  Discharge Diagnoses: same Active Problems:   Spine metastasis   Thoracic spine tumor   Discharged Condition: good  Hospital Course: [name]  Treatments: surgery: T5 laminectomy, T4-T7 posterior fusion with pedicle screws(creo, globus)  Discharge Exam: Blood pressure 116/47, pulse 83, temperature 97.6 F (36.4 C), temperature source Oral, resp. rate 21, height 5\' 9"  (1.753 m), weight 73.3 kg (161 lb 9.6 oz), SpO2 97 %. General appearance: alert, cooperative and appears stated age Neurologic: Mental status: Alert, oriented, thought content appropriate Cranial nerves: normal Motor: moving all extremities well Gait: Abnormal  Disposition: Final discharge disposition not confirmed Thoracic Tumor    Medication List    ASK your doctor about these medications        celecoxib 200 MG capsule  Commonly known as:  CELEBREX  Take 200 mg by mouth every other day.     CENTRUM SILVER PO  Take 1 tablet by mouth daily.     cholecalciferol 1000 UNITS tablet  Commonly known as:  VITAMIN D  Take 1,000 Units by mouth at bedtime.     dexamethasone 4 MG tablet  Commonly known as:  DECADRON  Take 1.5 tablets (6 mg total) by mouth 4 (four) times daily.     GLUCOSAMINE 1500 COMPLEX PO  Take 2 tablets by mouth daily after breakfast.     HYDROcodone-acetaminophen 5-325 MG per tablet  Commonly known as:  NORCO/VICODIN  Take 1-2 tablets by mouth every 4 (four) hours as needed for moderate pain.     HYDROCORTISONE (TOPICAL) 2.5 % Soln  Apply 1 application topically daily as needed.     levothyroxine 25 MCG tablet  Commonly known as:  SYNTHROID, LEVOTHROID  Take 25 mcg by mouth daily.     metroNIDAZOLE 0.75 % cream  Commonly known as:  METROCREAM  Apply 1  application topically See admin instructions. Every time pt washes face, he put a small amount on his nose.     omeprazole 40 MG capsule  Commonly known as:  PRILOSEC  Take 40 mg by mouth daily. In the afternoon     ranitidine 150 MG tablet  Commonly known as:  ZANTAC  Take 150 mg by mouth at bedtime.     REFRESH OP  Apply 1 drop to eye daily as needed (dry/irritated eyes).     tamsulosin 0.4 MG Caps capsule  Commonly known as:  FLOMAX  Take 0.4 mg by mouth at bedtime.         Signed: Cailee Blanke L 06/20/2014, 2:58 PM

## 2014-06-20 NOTE — Plan of Care (Signed)
Problem: Phase I Progression Outcomes Goal: OOB as tolerated unless otherwise ordered Outcome: Completed/Met Date Met:  06/20/14     

## 2014-06-20 NOTE — Plan of Care (Signed)
Problem: Phase II Progression Outcomes Goal: Dressings dry/intact Outcome: Completed/Met Date Met:  06/20/14

## 2014-06-20 NOTE — Consult Note (Signed)
Physical Medicine and Rehabilitation Consult Reason for Consult:metastatic thoracic tumor Referring Physician: Dr. Christella Noa   HPI: Jeremiah Owens is a 80 y.o. male with history of prostate cancer diagnosed 2002 for which he underwent radiation treatment. He also has a history of excision of a melanoma from the right neck. Patient was independent with a walker prior to admission living with his wife. Admitted 06/17/2014 with progressive mid back pain and gait disorder as well as pain in the anterior chest bilaterally, greater on the left. Recent cardiac workup unremarkable. MRI of thoracic spine November 5th  revealed spinal cord compression at T5 with enhancing lesion, pathologic fracture at T5, metastatic disease. Underwent thoracic T5 laminectomy for epidural tumor resection, thoracic 4-7 posterior lateral arthrodesis with segmental pedicle screw fixation 06/18/2014 per Dr. Cyndy Freeze. Hospital course pain management. Decadron protocol as advised. Oncology follow-up Dr. Earlie Server and awaiting final path biology report for further recommendations regarding treatment of condition. He is scheduled to have a PET scan. Physical and occupational therapy evaluation completed 06/19/2014 with recommendations of physical medicine rehabilitation consult.   Review of Systems  Gastrointestinal: Positive for constipation.  Genitourinary: Positive for urgency.  Musculoskeletal: Positive for myalgias and back pain.  Neurological: Positive for weakness.  All other systems reviewed and are negative.  Past Medical History  Diagnosis Date  . Compression fracture   . Prostate cancer   . Melanoma    Past Surgical History  Procedure Laterality Date  . Hernia repair     History reviewed. No pertinent family history. Social History:  reports that he has never smoked. He does not have any smokeless tobacco history on file. He reports that he does not drink alcohol or use illicit drugs. Allergies:  Allergies    Allergen Reactions  . Iodine Itching    Patient had a reaction 20 years ago and can't remember what kind of reaction he had.  Patient was given decadron in the ER and had an Angio ordered and did test with no problems.  Bottom line "patient needs to pre-medicated before any IV dye is given".   Medications Prior to Admission  Medication Sig Dispense Refill  . celecoxib (CELEBREX) 200 MG capsule Take 200 mg by mouth every other day.     . cholecalciferol (VITAMIN D) 1000 UNITS tablet Take 1,000 Units by mouth at bedtime.     . Glucosamine-Chondroit-Vit C-Mn (GLUCOSAMINE 1500 COMPLEX PO) Take 2 tablets by mouth daily after breakfast.     . HYDROcodone-acetaminophen (NORCO/VICODIN) 5-325 MG per tablet Take 1-2 tablets by mouth every 4 (four) hours as needed for moderate pain.    Marland Kitchen HYDROCORTISONE, TOPICAL, 2.5 % SOLN Apply 1 application topically daily as needed.     Marland Kitchen levothyroxine (SYNTHROID, LEVOTHROID) 25 MCG tablet Take 25 mcg by mouth daily.  4  . metroNIDAZOLE (METROCREAM) 0.75 % cream Apply 1 application topically See admin instructions. Every time pt washes face, he put a small amount on his nose.    . Multiple Vitamins-Minerals (CENTRUM SILVER PO) Take 1 tablet by mouth daily.    Marland Kitchen omeprazole (PRILOSEC) 40 MG capsule Take 40 mg by mouth daily. In the afternoon  11  . Polyvinyl Alcohol-Povidone (REFRESH OP) Apply 1 drop to eye daily as needed (dry/irritated eyes).    . ranitidine (ZANTAC) 150 MG tablet Take 150 mg by mouth at bedtime.   11  . tamsulosin (FLOMAX) 0.4 MG CAPS capsule Take 0.4 mg by mouth at bedtime.   3  .  dexamethasone (DECADRON) 4 MG tablet Take 1.5 tablets (6 mg total) by mouth 4 (four) times daily. 60 tablet 0    Home: Home Living Family/patient expects to be discharged to:: Inpatient rehab Additional Comments: lives with wife; has RW with seat;   Functional History: Prior Function Level of Independence: Needs assistance Gait / Transfers Assistance Needed:  declining ambulation PTA; had begun to use RW a few days PTA Functional Status:  Mobility: Bed Mobility Overal bed mobility: Needs Assistance Bed Mobility: Rolling, Sidelying to Sit Rolling: Supervision Sidelying to sit: Mod assist General bed mobility comments: roll with rail with vc; assist to raise trunk and maintain balance as coming to sit (leans posterior) Transfers Overall transfer level: Needs assistance Equipment used: 2 person hand held assist Transfers: Sit to/from Stand, Stand Pivot Transfers Sit to Stand: Min assist, +2 physical assistance, From elevated surface Stand pivot transfers: Mod assist, +2 physical assistance, From elevated surface General transfer comment: to stand x 2; pt self-selects wide BOS and able to "shimmy" his feet closer together; when pivoting to chair, pt's decr coordination ?proprioception caused near scissoring and difficulty placing feet      ADL: ADL Overall ADL's : Needs assistance/impaired Eating/Feeding: Independent, Sitting Grooming: Set up, Sitting Upper Body Bathing: Set up, Sitting Lower Body Bathing: Moderate assistance (with +2 min A sit<>stand from raised surface) Upper Body Dressing : Set up, Sitting Lower Body Dressing: Maximal assistance (with +2 min A sit<>stand from raised surface) Toilet Transfer: +2 for physical assistance, Moderate assistance (bil HHA from raised bed to recliner on pt's left) Toileting- Clothing Manipulation and Hygiene: Total assistance (with +2 min A sit<>stand from raised surface)  Cognition: Cognition Overall Cognitive Status: Within Functional Limits for tasks assessed Orientation Level: Oriented X4 Cognition Arousal/Alertness: Awake/alert Behavior During Therapy: WFL for tasks assessed/performed Overall Cognitive Status: Within Functional Limits for tasks assessed  Blood pressure 117/59, pulse 83, temperature 97 F (36.1 C), temperature source Oral, resp. rate 17, height 5\' 9"  (1.753 m), weight  73.3 kg (161 lb 9.6 oz), SpO2 96 %. Physical Exam  Constitutional: He is oriented to person, place, and time. He appears well-developed and well-nourished.  HENT:  Head: Normocephalic.  Right Ear: External ear normal.  Left Ear: External ear normal.  Eyes: Conjunctivae and EOM are normal. Pupils are equal, round, and reactive to light. Right eye exhibits no discharge. Left eye exhibits no discharge.  Neck: Normal range of motion. Neck supple. No JVD present. No tracheal deviation present. No thyromegaly present.  Cardiovascular: Normal rate, regular rhythm and normal heart sounds.   Respiratory: Effort normal and breath sounds normal. No respiratory distress. He has no wheezes. He has no rales. He exhibits no tenderness.  GI: Soft. Bowel sounds are normal. He exhibits no distension. There is no tenderness. There is no rebound.  Musculoskeletal:  Upper back/mid back pain  Lymphadenopathy:    He has no cervical adenopathy.  Neurological: He is alert and oriented to person, place, and time.  Follows commands. No cn findings. Left shoulder limited due to pain/ tightness. Otherwise UE 4/5. LE: HF 3/5. KE 3/5, ADF/APF 4/5. No obvious sensoryu findings. No resting tone.   Skin: Skin is warm.  Back incision is dressed  Psychiatric: He has a normal mood and affect. His behavior is normal. Judgment and thought content normal.    No results found for this or any previous visit (from the past 24 hour(s)). No results found.  Assessment/Plan: Diagnosis: metatstatic prostate cancer to the thoracic  spine s/p decompression and fusion 1. Does the need for close, 24 hr/day medical supervision in concert with the patient's rehab needs make it unreasonable for this patient to be served in a less intensive setting? Yes 2. Co-Morbidities requiring supervision/potential complications: pain, wound care 3. Due to bladder management, bowel management, safety, skin/wound care, disease management, medication  administration, pain management and patient education, does the patient require 24 hr/day rehab nursing? Yes 4. Does the patient require coordinated care of a physician, rehab nurse, PT (1-2 hrs/day, 5 days/week) and OT (1-2 hrs/day, 5 days/week) to address physical and functional deficits in the context of the above medical diagnosis(es)? Yes Addressing deficits in the following areas: balance, endurance, locomotion, strength, transferring, bowel/bladder control, bathing, dressing, feeding, grooming, cognition and psychosocial support 5. Can the patient actively participate in an intensive therapy program of at least 3 hrs of therapy per day at least 5 days per week? Yes 6. The potential for patient to make measurable gains while on inpatient rehab is excellent 7. Anticipated functional outcomes upon discharge from inpatient rehab are modified independent and supervision  with PT, modified independent and supervision with OT, n/a with SLP. 8. Estimated rehab length of stay to reach the above functional goals is: 10-15 days 9. Does the patient have adequate social supports and living environment to accommodate these discharge functional goals? Yes 10. Anticipated D/C setting: Home 11. Anticipated post D/C treatments: [place] therapy 12. Overall Rehab/Functional Prognosis: excellent  RECOMMENDATIONS: This patient's condition is appropriate for continued rehabilitative care in the following setting: CIR Patient has agreed to participate in recommended program. Yes Note that insurance prior authorization may be required for reimbursement for recommended care.  Comment: Rehab Admissions Coordinator to follow up.  Thanks,  Meredith Staggers, MD, Mellody Drown     06/20/2014

## 2014-06-20 NOTE — Plan of Care (Signed)
Problem: Phase I Progression Outcomes Goal: Incision/dressings dry and intact Outcome: Completed/Met Date Met:  06/20/14

## 2014-06-20 NOTE — Plan of Care (Signed)
Problem: Discharge Progression Outcomes Goal: Tubes and drains discontinued if indicated Outcome: Completed/Met Date Met:  06/20/14

## 2014-06-20 NOTE — Plan of Care (Signed)
Problem: Phase III Progression Outcomes Goal: Nasogastric tube discontinued Outcome: Completed/Met Date Met:  06/20/14

## 2014-06-20 NOTE — Plan of Care (Signed)
Problem: Phase III Progression Outcomes Goal: Pain controlled on oral analgesia Outcome: Completed/Met Date Met:  06/20/14

## 2014-06-20 NOTE — Progress Notes (Signed)
I met with Jeremiah Owens, his son, and his wife at bedside. We discussed an inpt rehab admission for his recovery. They are in agreement. I have a bed available to admit Jeremiah Owens to today. RN has contacted Dr. Lacy Duverney office to get order to d/c Jeremiah Owens directly to inpt rehab today. I will make arrangements once I  receive d/c order. [phone]

## 2014-06-20 NOTE — Plan of Care (Signed)
Problem: Phase III Progression Outcomes Goal: Voiding independently Outcome: Completed/Met Date Met:  06/20/14

## 2014-06-20 NOTE — Plan of Care (Signed)
Problem: Phase III Progression Outcomes Goal: Discharge plan remains appropriate-arrangements made Outcome: Completed/Met Date Met:  06/20/14

## 2014-06-20 NOTE — H&P (Signed)
Meredith Staggers, MD Physician Incomplete Physical Medicine and Rehabilitation H&P 06/20/2014 10:07 AM  Related encounter: ED to Hosp-Admission (Discharged) from 06/17/2014 in [place] ICU    Expand All Collapse All      Physical Medicine and Rehabilitation Admission H&P   Chief Complaint  Patient presents with  . Weakness  . Lesion to Spine   : HPI: Jeremiah Owens is a 80 y.o. male with history of prostate cancer diagnosed 2002 for which he underwent radiation treatment. He also has a history of excision of a melanoma from the right neck. Patient was independent with a walker prior to admission living with his wife. Admitted 06/17/2014 with progressive mid back pain and gait disorder as well as pain in the anterior chest bilaterally, greater on the left. Recent cardiac workup unremarkable. MRI of thoracic spine November 5th revealed spinal cord compression at T5 with enhancing lesion, pathologic fracture at T5, metastatic disease. Underwent thoracic T5 laminectomy for epidural tumor resection, thoracic 4-7 posterior lateral arthrodesis with segmental pedicle screw fixation 06/18/2014 per Dr. Cyndy Freeze. Hospital course pain management. Decadron protocol as advised and completed. Oncology follow-up Dr. Earlie Server and awaiting final path biology report for further recommendations regarding treatment of condition. He is scheduled to have a PET scan as an outpatient. Physical and occupational therapy evaluation completed 06/19/2014 with recommendations of physical medicine rehabilitation consult. Patient was admitted for a comprehensive rehabilitation program  ROS Review of Systems  Gastrointestinal: Positive for constipation.  Genitourinary: Positive for urgency.  Musculoskeletal: Positive for myalgias and back pain.  Neurological: Positive for weakness.  All other systems reviewed and are negative    Past Medical History  Diagnosis Date  .  Compression fracture   . Prostate cancer   . Melanoma    Past Surgical History  Procedure Laterality Date  . Hernia repair     History reviewed. No pertinent family history. Social History:  reports that he has never smoked. He does not have any smokeless tobacco history on file. He reports that he does not drink alcohol or use illicit drugs. Allergies:  Allergies  Allergen Reactions  . Iodine Itching    Patient had a reaction 20 years ago and can't remember what kind of reaction he had. Patient was given decadron in the ER and had an Angio ordered and did test with no problems. Bottom line "patient needs to pre-medicated before any IV dye is given".   Medications Prior to Admission  Medication Sig Dispense Refill  . celecoxib (CELEBREX) 200 MG capsule Take 200 mg by mouth every other day.     . cholecalciferol (VITAMIN D) 1000 UNITS tablet Take 1,000 Units by mouth at bedtime.     . Glucosamine-Chondroit-Vit C-Mn (GLUCOSAMINE 1500 COMPLEX PO) Take 2 tablets by mouth daily after breakfast.     . HYDROcodone-acetaminophen (NORCO/VICODIN) 5-325 MG per tablet Take 1-2 tablets by mouth every 4 (four) hours as needed for moderate pain.    Marland Kitchen HYDROCORTISONE, TOPICAL, 2.5 % SOLN Apply 1 application topically daily as needed.     Marland Kitchen levothyroxine (SYNTHROID, LEVOTHROID) 25 MCG tablet Take 25 mcg by mouth daily.  4  . metroNIDAZOLE (METROCREAM) 0.75 % cream Apply 1 application topically See admin instructions. Every time pt washes face, he put a small amount on his nose.    . Multiple Vitamins-Minerals (CENTRUM SILVER PO) Take 1 tablet by mouth daily.    Marland Kitchen omeprazole (PRILOSEC) 40 MG capsule Take 40 mg by mouth daily.  In the afternoon  11  . Polyvinyl Alcohol-Povidone (REFRESH OP) Apply 1 drop to eye daily as needed (dry/irritated eyes).    . ranitidine (ZANTAC) 150 MG tablet Take 150 mg by mouth at bedtime.   11   . tamsulosin (FLOMAX) 0.4 MG CAPS capsule Take 0.4 mg by mouth at bedtime.   3  . dexamethasone (DECADRON) 4 MG tablet Take 1.5 tablets (6 mg total) by mouth 4 (four) times daily. 60 tablet 0    Home: Home Living Family/patient expects to be discharged to:: Inpatient rehab Additional Comments: lives with wife; has RW with seat;   Functional History: Prior Function Level of Independence: Needs assistance Gait / Transfers Assistance Needed: declining ambulation PTA; had begun to use RW a few days PTA  Functional Status:  Mobility: Bed Mobility Overal bed mobility: Needs Assistance Bed Mobility: Rolling, Sidelying to Sit Rolling: Supervision Sidelying to sit: Mod assist General bed mobility comments: roll with rail with vc; assist to raise trunk and maintain balance as coming to sit (leans posterior) Transfers Overall transfer level: Needs assistance Equipment used: 2 person hand held assist Transfers: Sit to/from Stand, Stand Pivot Transfers Sit to Stand: Min assist, +2 physical assistance, From elevated surface Stand pivot transfers: Mod assist, +2 physical assistance, From elevated surface General transfer comment: to stand x 2; pt self-selects wide BOS and able to "shimmy" his feet closer together; when pivoting to chair, pt's decr coordination ?proprioception caused near scissoring and difficulty placing feet      ADL: ADL Overall ADL's : Needs assistance/impaired Eating/Feeding: Independent, Sitting Grooming: Set up, Sitting Upper Body Bathing: Set up, Sitting Lower Body Bathing: Moderate assistance (with +2 min A sit<>stand from raised surface) Upper Body Dressing : Set up, Sitting Lower Body Dressing: Maximal assistance (with +2 min A sit<>stand from raised surface) Toilet Transfer: +2 for physical assistance, Moderate assistance (bil HHA from raised bed to recliner on pt's left) Toileting- Clothing Manipulation and Hygiene: Total assistance (with +2 min  A sit<>stand from raised surface)  Cognition: Cognition Overall Cognitive Status: Within Functional Limits for tasks assessed Orientation Level: Oriented X4 Cognition Arousal/Alertness: Awake/alert Behavior During Therapy: WFL for tasks assessed/performed Overall Cognitive Status: Within Functional Limits for tasks assessed  Physical Exam: Blood pressure 130/75, pulse 79, temperature 97.6 F (36.4 C), temperature source Oral, resp. rate 14, height 5\' 9"  (1.753 m), weight 73.3 kg (161 lb 9.6 oz), SpO2 93 %. Physical Exam Constitutional: He is oriented to person, place, and time. He appears well-developed and well-nourished.  HENT: dentition good Head: Normocephalic.  Right Ear: External ear normal.  Left Ear: External ear normal.  Eyes: Conjunctivae and EOM are normal. Pupils are equal, round, and reactive to light. Right eye exhibits no discharge. Left eye exhibits no discharge.  Neck: Normal range of motion. Neck supple. No JVD present. No tracheal deviation present. No thyromegaly present.  Cardiovascular: Normal rate, regular rhythm and normal heart sounds.  Respiratory: Effort normal and breath sounds normal. No respiratory distress. He has no wheezes. He has no rales. He exhibits no tenderness.  GI: Soft. Bowel sounds are normal. He exhibits no distension. There is no tenderness. There is no rebound.  Musculoskeletal:  Upper back/mid back pain  Lymphadenopathy:   He has no cervical adenopathy.  Neurological: He is alert and oriented to person, place, and time.  Follows commands. No cn findings. Left shoulder limited due to pain/ tightness. Otherwise UE 4/5. LE: HF 3/5. KE 3/5, ADF/APF 4/5. No obvious sensory findings  in either leg/abdomen. No resting tone.  Skin: Skin is warm.  Back incision is dressed, clean Psychiatric: He has a normal mood and affect. His behavior is normal. Judgment and thought content normal    Lab Results Last 48 Hours    No results found  for this or any previous visit (from the past 48 hour(s)).    Imaging Results (Last 48 hours)    No results found.       Medical Problem List and Plan: 1. Functional deficits secondary to metastatic prostate cancer to thoracic spine status post decompression and fusion T4-7 06/18/2014 2. DVT Prophylaxis/Anticoagulation: SCDs. Check vascular study 3. Pain Management: Hydrocodone and Robaxin as needed. Monitor with increased mobility 4. Hypothyroidism. Synthroid 5. Neuropsych: This patient is capable of making decisions on his own behalf. 6. Skin/Wound Care: routine skin checks 7. Fluids/Electrolytes/Nutrition: strict I and O's. Follow-up chemistries. Provide nutritional supplements as needed. 8.BPH/history of prostate cancer. Flomax 0.4 mg daily at bedtime. Check PVR 3     Post Admission Physician Evaluation: 1. Functional deficits secondary to metastatic prostate cancer to the thoracic spine s/p decompression and fusion at T4-7. 2. Patient is admitted to receive collaborative, interdisciplinary care between the physiatrist, rehab nursing staff, and therapy team. 3. Patient's level of medical complexity and substantial therapy needs in context of that medical necessity cannot be provided at a lesser intensity of care such as a SNF. 4. Patient has experienced substantial functional loss from his/her baseline which was documented above under the "Functional History" and "Functional Status" headings. Judging by the patient's diagnosis, physical exam, and functional history, the patient has potential for functional progress which will result in measurable gains while on inpatient rehab. These gains will be of substantial and practical use upon discharge in facilitating mobility and self-care at the household level. 5. Physiatrist will provide 24 hour management of medical needs as well as oversight of the therapy plan/treatment and provide guidance as appropriate regarding the interaction  of the two. 6. 24 hour rehab nursing will assist with bladder management, bowel management, safety, skin/wound care, disease management, medication administration, pain management and patient education and help integrate therapy concepts, techniques,education, etc. 7. PT will assess and treat for/with: Lower extremity strength, range of motion, stamina, balance, functional mobility, safety, adaptive techniques and equipment, back precautions, pain mgt, ego support, education. Goals are: mod I. 8. OT will assess and treat for/with: ADL's, functional mobility, safety, upper extremity strength, adaptive techniques and equipment, NMR, back precautions, education, community reintegration. Goals are: mod I to supervision. Therapy may not yet proceed with showering this patient. 9. SLP will assess and treat for/with: n/a. Goals are: n/a. 10. Case Management and Social Worker will assess and treat for psychological issues and discharge planning. 11. Team conference will be held weekly to assess progress toward goals and to determine barriers to discharge. 12. Patient will receive at least 3 hours of therapy per day at least 5 days per week. 13. ELOS: 13-17 days  14. Prognosis: excellent     Meredith Staggers, MD, [place] [facility] 06/20/2014

## 2014-06-20 NOTE — Plan of Care (Signed)
Problem: Phase II Progression Outcomes Goal: Sutures/staples intact Outcome: Completed/Met Date Met:  06/20/14

## 2014-06-20 NOTE — Plan of Care (Signed)
Problem: Phase I Progression Outcomes Goal: Voiding-avoid urinary catheter unless indicated Outcome: Completed/Met Date Met:  06/20/14     

## 2014-06-20 NOTE — Plan of Care (Signed)
Problem: Discharge Progression Outcomes Goal: Activity appropriate for discharge plan Outcome: Completed/Met Date Met:  06/20/14     

## 2014-06-20 NOTE — Plan of Care (Signed)
Problem: Discharge Progression Outcomes Goal: Hemodynamically stable Outcome: Completed/Met Date Met:  06/20/14     

## 2014-06-20 NOTE — PMR Pre-admission (Signed)
PMR Admission Coordinator Pre-Admission Assessment  Patient: Jeremiah Owens is an 80 y.o., male MRN: [id number] DOB: 11/28/1929 Height: 5\' 9"  (175.3 cm) Weight: 73.3 kg (161 lb 9.6 oz)              Insurance Information HMO:     PPO:      PCP:      IPA:      80/20: yes     OTHER:  No HMO PRIMARY: medicare a and b      Policy#: [id number]      Subscriber: pt Benefits:  Phone #: pal[employer]to online     Name: 06/20/2014 Eff. Date: 10/02/1996     Deduct: $1260      Out of Pocket Max: none      Life Max: none CIR: 100%      SNF: 20 full days Outpatient: 80%     Co-Pay: 20% Home Health: 100%      Co-Pay: no copay DME: 80%     Co-Pay: 20% Providers: pt choice  SECONDARY: Tricare for life      Policy#: [id number]      Subscriber: pt  Medicaid Application Date:       Case Manager:  Disability Application Date:       Case Worker:   Emergency Contact Information Contact Information    Name Relation Home Work Mobile   [place] Spouse [phone]  [phone]   Jorja Loa [phone]     Dam[place], [place]   [phone]     Current Medical History  Patient Admitting Diagnosis: Metastatic prostate cancer to the thoracic spine s/p decompression adn fusion  History of Present Illness: Jeremiah Owens is a 80 y.o. male with history of prostate cancer diagnosed 2002 for which he underwent radiation treatment. He also has a history of excision of a melanoma from the right neck. Patient was independent with a walker prior to admission living with his wife for past three weeks. Otherwise completely independent up up until first  Of November.   Admitted 06/17/2014 with progressive mid back pain and gait disorder as well as pain in the anterior chest bilaterally, greater on the left. Recent cardiac workup unremarkable. MRI of thoracic spine November 5th revealed spinal cord compression at T5 with enhancing lesion, pathologic fracture at T5, metastatic disease. Underwent thoracic T5 laminectomy for  epidural tumor resection, thoracic 4-7 posterior lateral arthrodesis with segmental pedicle screw fixation 06/18/2014 per Dr. Cyndy Freeze. Hospital course pain management. Decadron protocol as advised. Oncology follow-up Dr. Earlie Server and awaiting final path biology report for further recommendations regarding treatment of condition. He is scheduled to have a PET scan after d/c.  Past Medical History  Past Medical History  Diagnosis Date  . Compression fracture   . Prostate cancer   . Melanoma     Family History  family history is not on file.  Prior Rehab/Hospitalizations: none  Current Medications  Current facility-administered medications: Place/Maintain arterial line, , , Until Discontinued **AND** 0.9 %  sodium chloride infusion, , Intra-arterial, PRN, Ashok Pall, MD;  0.9 % NaCl with KCl 20 mEq/ L  infusion, , Intravenous, Continuous, Ashok Pall, MD, Last Rate: 80 mL/hr at 06/20/14 1145 acetaminophen (TYLENOL) tablet 650 mg, 650 mg, Oral, Q4H PRN **OR** acetaminophen (TYLENOL) suppository 650 mg, 650 mg, Rectal, Q4H PRN, Ashok Pall, MD;  bisacodyl (DULCOLAX) suppository 10 mg, 10 mg, Rectal, Daily PRN, Ashok Pall, MD;  cholecalciferol (VITAMIN D) tablet 1,000 Units, 1,000 Units, Oral, QHS, Ashok Pall, MD, 1,000 Units at 06/19/14  2224;  diazepam (VALIUM) tablet 5 mg, 5 mg, Oral, Q6H PRN, Ashok Pall, MD famotidine (PEPCID) tablet 20 mg, 20 mg, Oral, QHS, Ashok Pall, MD, 20 mg at 06/19/14 2224;  HYDROcodone-acetaminophen (NORCO/VICODIN) 5-325 MG per tablet 1-2 tablet, 1-2 tablet, Oral, Q4H PRN, Ashok Pall, MD;  hydrocortisone cream 1 %, , Topical, Daily PRN, Ashok Pall, MD;  levothyroxine (SYNTHROID, LEVOTHROID) tablet 25 mcg, 25 mcg, Oral, QAC breakfast, Ashok Pall, MD, 25 mcg at 06/20/14 1035 menthol-cetylpyridinium (CEPACOL) lozenge 3 mg, 1 lozenge, Oral, PRN **OR** phenol (CHLORASEPTIC) mouth spray 1 spray, 1 spray, Mouth/Throat, PRN, Ashok Pall, MD, 1 spray at 06/18/14  0160;  metroNIDAZOLE (METROCREAM) 0.75 % cream, , Topical, PRN, Ashok Pall, MD;  morphine 2 MG/ML injection 1-4 mg, 1-4 mg, Intravenous, Q3H PRN, Ashok Pall, MD;  ondansetron (ZOFRAN) injection 4 mg, 4 mg, Intravenous, Q4H PRN, Ashok Pall, MD ondansetron (ZOFRAN) tablet 4 mg, 4 mg, Oral, Q6H PRN **OR** [DISCONTINUED] ondansetron (ZOFRAN) injection 4 mg, 4 mg, Intravenous, Q6H PRN, Ashok Pall, MD;  oxyCODONE-acetaminophen (PERCOCET/ROXICET) 5-325 MG per tablet 1-2 tablet, 1-2 tablet, Oral, Q4H PRN, Ashok Pall, MD, 1 tablet at 06/20/14 0955;  pantoprazole (PROTONIX) EC tablet 80 mg, 80 mg, Oral, Daily, Ashok Pall, MD, 80 mg at 06/20/14 1035 polyethylene glycol (MIRALAX / GLYCOLAX) packet 17 g, 17 g, Oral, Daily PRN, Ashok Pall, MD;  polyvinyl alcohol (LIQUIFILM TEARS) 1.4 % ophthalmic solution 1 drop, 1 drop, Both Eyes, PRN, Ashok Pall, MD;  senna (SENOKOT) tablet 8.6 mg, 1 tablet, Oral, BID, Ashok Pall, MD, 8.6 mg at 06/20/14 1035;  tamsulosin (FLOMAX) capsule 0.4 mg, 0.4 mg, Oral, QHS, Ashok Pall, MD, 0.4 mg at 06/19/14 2222  Patients Current Diet: Diet regular  Precautions / Restrictions Precautions Precautions: Fall, Back Precaution Booklet Issued: No Restrictions Weight Bearing Restrictions: No   Prior Activity Level Community (5-7x/wk): Independent without AD up until first week of November Had just renewed his hunting license for hunts in [place] with friends. Typically completely independent without AD. Driving, etc.  [place] Devices/Equipment: Cane (specify quad or straight), Communication device (specify type)  Prior Functional Level Prior Function Level of Independence: Needs assistance Gait / Transfers Assistance Needed: declining ambulation PTA; had begun to use RW a few days PTA  Current Functional Level Cognition  Overall Cognitive Status: Within Functional Limits for tasks assessed Orientation Level: Oriented X4     Extremity Assessment (includes Sensation/Coordination)          ADLs  Overall ADL's : Needs assistance/impaired Eating/Feeding: Independent, Sitting Grooming: Set up, Sitting Upper Body Bathing: Set up, Sitting Lower Body Bathing: Moderate assistance (with +2 min A sit<>stand from raised surface) Upper Body Dressing : Set up, Sitting Lower Body Dressing: Maximal assistance (with +2 min A sit<>stand from raised surface) Toilet Transfer: +2 for physical assistance, Moderate assistance (bil HHA from raised bed to recliner on pt's left) Toileting- Clothing Manipulation and Hygiene: Total assistance (with +2 min A sit<>stand from raised surface)    Mobility  Overal bed mobility: Needs Assistance Bed Mobility: Rolling, Sidelying to Sit Rolling: Supervision Sidelying to sit: Mod assist General bed mobility comments: roll with rail with vc; assist to raise trunk and maintain balance as coming to sit (leans posterior)    Transfers  Overall transfer level: Needs assistance Equipment used: 2 person hand held assist Transfers: Sit to/from Stand, Stand Pivot Transfers Sit to Stand: Min assist, +2 physical assistance, From elevated surface Stand pivot transfers: Mod assist, +2  physical assistance, From elevated surface General transfer comment: to stand x 2; pt self-selects wide BOS and able to "shimmy" his feet closer together; when pivoting to chair, pt's decr coordination ?proprioception caused near scissoring and difficulty placing feet    Ambulation / Gait / Stairs / Wheelchair Mobility       Posture / Balance Dynamic Sitting Balance Sitting balance - Comments: loses balance posteriorly without righting reactions; can identify direction, but requires cues and assist to correct    Special needs/care consideration Bowel mgmt: continent, large 06/20/2014 Bladder mgmt:urinal    Previous Home Environment Living Arrangements: Spouse/significant other  Lives With: Spouse Available Help  at Discharge: Family, Available 24 hours/day Type of Home: House Home Layout: One level Home Access: Stairs to enter Entrance Stairs-Rails: None Entrance Stairs-Number of Steps: 1 step from garage, back dorr with one step to porch and then one step into the door Bathroom Shower/Tub: Walk-in shower, Other (comment) (w/c accessible shower) Bathroom Toilet: Handicapped height Bathroom Accessibility: Yes How Accessible: Accessible via wheelchair, Accessible via [place]: No Additional Comments: moved to this home 10 years ago. bathroom fully hadicapped shower. Son Surveyor, minerals bars installed throughout bathroom  Discharge Living Setting Plans for Discharge Living Setting: Patient's home, Lives with (comment), Other (Comment) (lives with 65 yo wife; she does not use AD) Type of Home at Discharge: House Discharge Home Layout: One level Discharge Home Access: Stairs to enter Entrance Stairs-Rails: None Entrance Stairs-Number of Steps: 1 step from garage; one step into back door onto porch and then one step into the house Discharge Bathroom Shower/Tub: Walk-in shower Discharge Bathroom Toilet: Handicapped height Discharge Bathroom Accessibility: Yes How Accessible: Accessible via wheelchair, Accessible via walker Does the patient have any problems obtaining your medications?: No  Social/Family/Support Systems Patient Roles: Spouse, Parent Contact Information: Naim Murtha, wife and his sister, Henrene Hawking Anticipated Caregiver: wife Anticipated Caregiver's Contact Information: see above Ability/Limitations of Caregiver: wife 19 years old Caregiver Availability: 24/7 Discharge Plan Discussed with Primary Caregiver: Yes Is Caregiver In Agreement with Plan?: Yes Does Caregiver/Family have Issues with Lodging/Transportation while Pt is in Rehab?: No  Goals/Additional Needs Patient/Family Goal for Rehab: Mod I to supervision with PT and OT Expected length of stay: ELOS 10 to  15 days Special Service Needs: HOH with B hearing aids Pt/Family Agrees to Admission and willing to participate: Yes Program Orientation Provided & Reviewed with Pt/Caregiver Including Roles  & Responsibilities: Yes   Decrease burden of Care through IP rehab admission: n/a  Possible need for SNF placement upon discharge:no   Patient Condition: This patient's condition remains as documented in the consult dated 06/20/2014, in which the Rehabilitation Physician determined and documented that the patient's condition is appropriate for intensive rehabilitative care in an inpatient rehabilitation facility. Will admit to inpatient rehab today.  Preadmission Screen Completed By:  Cleatrice Burke, 06/20/2014 11:45 AM ______________________________________________________________________   Discussed status with Dr. Naaman Plummer on 06/20/2014 at  1145 and received telephone approval for admission today.  Admission Coordinator:  Cleatrice Burke, time 3785 Date 06/20/2014.

## 2014-06-20 NOTE — Plan of Care (Signed)
Problem: Discharge Progression Outcomes Goal: Discharge plan in place and appropriate Outcome: Completed/Met Date Met:  06/20/14

## 2014-06-21 ENCOUNTER — Inpatient Hospital Stay (HOSPITAL_COMMUNITY): Payer: Medicare Other | Admitting: Occupational Therapy

## 2014-06-21 ENCOUNTER — Inpatient Hospital Stay (HOSPITAL_COMMUNITY): Payer: Medicare Other

## 2014-06-21 ENCOUNTER — Inpatient Hospital Stay (HOSPITAL_COMMUNITY): Payer: Medicare Other | Admitting: Physical Therapy

## 2014-06-21 DIAGNOSIS — D62 Acute posthemorrhagic anemia: Secondary | ICD-10-CM

## 2014-06-21 DIAGNOSIS — G8220 Paraplegia, unspecified: Secondary | ICD-10-CM

## 2014-06-21 DIAGNOSIS — R609 Edema, unspecified: Secondary | ICD-10-CM

## 2014-06-21 LAB — COMPREHENSIVE METABOLIC PANEL
ALBUMIN: 2.5 g/dL — AB (ref 3.5–5.2)
ALT: 29 U/L (ref 0–53)
AST: 31 U/L (ref 0–37)
Alkaline Phosphatase: 47 U/L (ref 39–117)
Anion gap: 9 (ref 5–15)
BUN: 26 mg/dL — ABNORMAL HIGH (ref 6–23)
CO2: 25 mEq/L (ref 19–32)
Calcium: 8.1 mg/dL — ABNORMAL LOW (ref 8.4–10.5)
Chloride: 104 mEq/L (ref 96–112)
Creatinine, Ser: 0.89 mg/dL (ref 0.50–1.35)
GFR calc non Af Amer: 77 mL/min — ABNORMAL LOW (ref >90)
GFR, EST AFRICAN AMERICAN: 90 mL/min — AB (ref >90)
GLUCOSE: 106 mg/dL — AB (ref 70–99)
POTASSIUM: 3.7 meq/L (ref 3.7–5.3)
SODIUM: 138 meq/L (ref 137–147)
TOTAL PROTEIN: 6.3 g/dL (ref 6.0–8.3)
Total Bilirubin: <0.2 mg/dL — ABNORMAL LOW (ref 0.3–1.2)

## 2014-06-21 LAB — CBC WITH DIFFERENTIAL/PLATELET
Basophils Absolute: 0 10*3/uL (ref 0.0–0.1)
Basophils Relative: 0 % (ref 0–1)
EOS ABS: 0.1 10*3/uL (ref 0.0–0.7)
Eosinophils Relative: 1 % (ref 0–5)
HCT: 28.5 % — ABNORMAL LOW (ref 39.0–52.0)
HEMOGLOBIN: 9.6 g/dL — AB (ref 13.0–17.0)
LYMPHS ABS: 2 10*3/uL (ref 0.7–4.0)
Lymphocytes Relative: 26 % (ref 12–46)
MCH: 32.3 pg (ref 26.0–34.0)
MCHC: 33.7 g/dL (ref 30.0–36.0)
MCV: 96 fL (ref 78.0–100.0)
MONOS PCT: 10 % (ref 3–12)
Monocytes Absolute: 0.7 10*3/uL (ref 0.1–1.0)
NEUTROS ABS: 4.8 10*3/uL (ref 1.7–7.7)
NEUTROS PCT: 63 % (ref 43–77)
Platelets: 134 10*3/uL — ABNORMAL LOW (ref 150–400)
RBC: 2.97 MIL/uL — AB (ref 4.22–5.81)
RDW: 14 % (ref 11.5–15.5)
WBC: 7.6 10*3/uL (ref 4.0–10.5)

## 2014-06-21 NOTE — Plan of Care (Signed)
Problem: RH BOWEL ELIMINATION Goal: RH STG MANAGE BOWEL WITH ASSISTANCE STG Manage Bowel with Assistance. Mod I  Outcome: Progressing  Problem: RH SKIN INTEGRITY Goal: RH STG SKIN FREE OF INFECTION/BREAKDOWN Min A  Outcome: Progressing  Problem: RH PAIN MANAGEMENT Goal: RH STG PAIN MANAGED AT OR BELOW PT'S PAIN GOAL <5  Outcome: Progressing

## 2014-06-21 NOTE — Evaluation (Signed)
Physical Therapy Assessment and Plan  Patient Details  Name: Jeremiah Owens MRN: [id number] Date of Birth: 23-Oct-1929  PT Diagnosis: Abnormal posture, Abnormality of gait, Ataxia, Ataxic gait, Coordination disorder, Difficulty walking, Edema, Impaired sensation and Muscle weakness Rehab Potential: Good ELOS: 10-14 days   Today's Date: 06/21/2014 PT Individual Time: 0830-0930 Treatment Session 2: 1400-1500 PT Individual Time Calculation (min): 60 min   Treatment Session 2: 60 min  Problem List:  Patient Active Problem List   Diagnosis Date Noted  . Paraplegia 06/21/2014  . Postoperative anemia due to acute blood loss 06/21/2014  . 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 Spine metastasis 06/17/2014  . Thoracic spine tumor 06/17/2014  . Metastatic bone cancer 06/15/2014    Past Medical History:  Past Medical History  Diagnosis Date  . Compression fracture   . Prostate cancer   . Melanoma    Past Surgical History:  Past Surgical History  Procedure Laterality Date  . Hernia repair    . Posterior cervical fusion/foraminotomy N/A 06/17/2014    Procedure: Thoracic five laminectomy for epidural tumor resection Thoracic 4-7 posterior lateral arthrodesis, segmental pedicle screw fixation.;  Surgeon: Ashok Pall, MD;  Location: [facility];  Service: Neurosurgery;  Laterality: N/A;    Assessment & Plan Clinical Impression: Jeremiah Owens is a 80 y.o. male with history of prostate cancer diagnosed 2002 for which he underwent radiation treatment. He also has a history of excision of a melanoma from the right neck. Patient was independent with a walker prior to admission living with his wife. Admitted 06/17/2014 with progressive mid back pain and gait disorder as well as pain in the anterior chest bilaterally, greater on the left. Recent cardiac workup unremarkable. MRI of thoracic spine November 5th revealed spinal cord compression at T5 with enhancing lesion, pathologic  fracture at T5, metastatic disease. Underwent thoracic T5 laminectomy for epidural tumor resection, thoracic 4-7 posterior lateral arthrodesis with segmental pedicle screw fixation 06/18/2014 per Dr. Cyndy Freeze. Hospital course pain management. Decadron protocol as advised and completed. Oncology follow-up Dr. Earlie Server and awaiting final path biology report for further recommendations regarding treatment of condition. He is scheduled to have a PET scan as an outpatient. Physical and occupational therapy evaluation completed 06/19/2014 with recommendations of physical medicine rehabilitation consult. Patient transferred to [facility] on 06/20/2014 .   Patient currently requires mod with mobility secondary to muscle weakness, decreased cardiorespiratoy endurance, impaired timing and sequencing, ataxia and decreased coordination and decreased sitting balance, decreased standing balance, decreased postural control, decreased balance strategies and difficulty maintaining precautions.  Prior to hospitalization, patient was modified independent  with mobility and lived with Spouse in a House home.  Home access is 1 step from garage, back dorr with one step to porch and then one step into the doorStairs to enter.  Patient will benefit from skilled PT intervention to maximize safe functional mobility, minimize fall risk and decrease caregiver burden for planned discharge home with 24 hour assist.  Anticipate patient will benefit from follow up OP at discharge.  PT - End of Session Activity Tolerance: Tolerates 10 - 20 min activity with multiple rests Endurance Deficit: Yes Endurance Deficit Description: fatigues quickly - cardiorespiratory PT Assessment Rehab Potential (ACUTE/IP ONLY): Good Barriers to Discharge: [place] home environment PT Patient demonstrates impairments in the following area(s): Balance;Sensory;Skin Integrity;Edema;Endurance;Motor;Pain PT Transfers Functional Problem(s): Bed Mobility;Bed to  Chair;Car;Furniture PT Locomotion Functional Problem(s): Ambulation;Wheelchair Mobility PT Plan PT Intensity: Minimum of 1-2 x/day ,45 to 90 minutes PT Frequency: 5  out of 7 days PT Duration Estimated Length of Stay: 10-14 days PT Treatment/Interventions: Ambulation/gait training;Community reintegration;DME/adaptive equipment instruction;Neuromuscular re-education;Psychosocial support;Stair training;UE/LE Strength taining/ROM;Wheelchair propulsion/positioning;Balance/vestibular training;Discharge planning;Pain management;Skin care/wound management;Therapeutic Activities;UE/LE Coordination activities;Disease management/prevention;Functional mobility training;Patient/family education;Splinting/orthotics;Therapeutic Exercise PT Transfers Anticipated Outcome(s): Mod I PT Locomotion Anticipated Outcome(s): Mod I PT Recommendation Follow Up Recommendations: Outpatient PT;Home health PT Patient destination: Home Equipment Recommended: To be determined Equipment Details: Pt owns RW and 4WW  Skilled Therapeutic Intervention Treatment Session 1: PT Evaluation: PT competes functional mobility assessment, sensation/proprioception testing, balance evaluation and finds that pt has good strength in B LEs, but demonstrates ataxia and impaired proprioception in B LEs, which affects his independence in transfers and ambulation. Pt also has spinal precautions to follow (he could name 0/3 during eval), which is affecting his independence with bed mobility.   Therapeutic Activity: PT instructs pt in sit to stand from recliner req min-mod A, stand-step transfer recliner to w/c req mod A with armrests only. PT also instructs pt in w/c to/from bed and w/c to toilet with grab bar req mod A for balance and ability to stand. PT instructs pt in sit to side lie transfer in order to follow spinal precautions req mod A for B LEs and in side lie to sit transfer req min A to follow spinal precautions.   Gait Training: PT  insructs pt in ambulation with RW x 20' req mod A for balance and verbal cues for technique - pt demonstrates impaired gait including: variable BOS, weight on heels, trunk flexed, and feet being placed too far forward outside front of RW.   PT educated pt re: each of his spinal precautions and pt verbalizes understanding. Pt is very motivated to work hard and maximize functional independence. Only one episode of knee buckling during a transfer noted.   Treatment Session 2: Therapeutic Activity: PT found pt on BSC over toilet immediately after OT session and pt req min-mod A sit to stand, tot A for pants management and posterior hygiene, then min-mod A to transfer off of toilet to w/c with grab bars.  PT instructs pt in hemi height w/c to standard height w/c stand-step transfer with armrests only req mod A.   Gait Training: PT instructs pt in ascending/descending 1 standard height step with B rails req min A.   W/C Management: PT exchanged pt's hemi-height 18x16 manual w/c for an 18x18 standard height manual w/c for improved ease of sit to stand and improved back support.  PT educated pt re: Actor and brake management of new w/c.  PT instructs pt in w/c propulsion with B UEs x 100' req SBA.   Neuromuscular Reeducation: PT administers Berg Balance Test and pt scores 4/56, indicating significant falls risk.   Pt has significant balance deficit and is very fatigued by the end of PM PT session. Pt will benefit from IPR PT. Continue per PT POC.      PT Evaluation Precautions/Restrictions Precautions Precautions: Fall;Back Precaution Booklet Issued: No Restrictions Weight Bearing Restrictions: No General Chart Reviewed: Yes Family/Caregiver Present: No  Pain Pain Assessment Pain Assessment: 0-10 Pain Score: 5  Pain Type: Surgical pain Pain Location: Back Pain Orientation: Mid Pain Descriptors / Indicators: Aching Pain Frequency: Intermittent Pain Onset:  On-going Pain Intervention(s): Rest Multiple Pain Sites: No  Treatment Session 2: Pt c/o 8/10 surgical back pain in medial mid back - PT notified RN of pain level and she came to give meds.   Home Living/Prior Functioning Home Living Available Help  at Discharge: Family;Available 24 hours/day Type of Home: House Home Access: Stairs to enter [employer] of Steps: 1 step from garage, back dorr with one step to porch and then one step into the door Entrance Stairs-Rails: None Home Layout: One level Additional Comments: moved to this home 10 years ago. bathroom fully hadicapped shower. Son Surveyor, minerals bars installed throughout bathroom  Lives With: Spouse Prior Function Level of Independence: Independent with basic ADLs;Requires assistive device for independence  Able to Take Stairs?: Yes Driving: Yes Vocation: Retired Oceanographer Comments: [facility]  Cognition Overall Cognitive Status: Within Functional Limits for tasks assessed Arousal/Alertness: Awake/alert Orientation Level: Oriented X4 Attention: Focused;Sustained Focused Attention: Appears intact Sustained Attention: Appears intact Memory: Appears intact Awareness: Appears intact Problem Solving: Impaired - Functional Complex Safety/Judgment: Appears intact Comments: pt is verbose Sensation Sensation Light Touch: Impaired Detail Light Touch Impaired Details: Impaired RLE;Impaired LLE Stereognosis: Not tested Hot/Cold: Not tested Proprioception: Impaired Detail Proprioception Impaired Details: Impaired RLE;Impaired LLE Additional Comments: feet feel "tight", difficulty in B ankle proprioception "hard to tell" Coordination Gross Motor Movements are Fluid and Coordinated: No Fine Motor Movements are Fluid and Coordinated: Not tested Coordination and Movement Description: ataxia in B legs noted Finger Nose Finger Test: slight end point/destination difficulty and intention tremor bilaterally Heel  Shin Test: impaired  control more L side > R side Motor  Motor Motor: Ataxia Motor - Skilled Clinical Observations: in open chain B LE motions: L side more impaired than R  Mobility Bed Mobility Bed Mobility: Rolling Right;Rolling Left;Sit to Sidelying Left;Left Sidelying to Sit Rolling Right: 4: Min guard Rolling Right Details: Verbal cues for technique Rolling Right Details (indicate cue type and reason): logroll technique Rolling Left: 4: Min guard Left Sidelying to Sit: 4: Min assist Left Sidelying to Sit Details: Manual facilitation for placement;Verbal cues for technique Left Sidelying to Sit Details (indicate cue type and reason): avoid twisting of the spine while pushing up Sit to Sidelying Left: 3: Mod assist Sit to Sidelying Left Details: Manual facilitation for placement Sit to Sidelying Left Details (indicate cue type and reason): B LE assist from PT Transfers Transfers: Yes Sit to Stand: 4: Min assist;With upper extremity assist;From chair/3-in-1;With armrests Sit to Stand Details: Manual facilitation for placement;Verbal cues for technique Stand to Sit: 4: Min assist;With armrests;Without upper extremity assist Stand to Sit Details (indicate cue type and reason): Manual facilitation for placement;Verbal cues for technique Stand Pivot Transfers: 3: Mod assist Stand Pivot Transfer Details: Manual facilitation for placement;Manual facilitation for weight bearing Stand Pivot Transfer Details (indicate cue type and reason): one leg buckles during bed to w/c transfer, but during recliner to w/c and w/c to bed - no buckling noted.  Locomotion  Ambulation Ambulation: Yes Ambulation/Gait Assistance: 3: Mod assist Ambulation Distance (Feet): 20 Feet Assistive device: Rolling walker Ambulation/Gait Assistance Details: Manual facilitation for placement Gait Gait: Yes Gait Pattern: Impaired Gait Pattern: Trunk flexed;Narrow base of support;Ataxic;Step-through pattern (variable  BOS, feet too far in front of RW) Gait velocity: decreased Wheelchair Mobility Wheelchair Mobility: Yes Wheelchair Assistance: 4: Advertising account executive Details: Manual facilitation for placement Wheelchair Propulsion: Both upper extremities;Both lower extermities Wheelchair Parts Management: Needs assistance Distance: 25  Trunk/Postural Assessment  Cervical Assessment Cervical Assessment: Exceptions to [facility] Cervical AROM Overall Cervical AROM: Deficits;Due to premorid status Overall Cervical AROM Comments: significant forward head Thoracic Assessment Thoracic Assessment: Exceptions to [facility] Thoracic AROM Overall Thoracic AROM: Deficits;Due to precautions  Balance Balance Balance Assessed: Yes Static Sitting  Balance Static Sitting - Balance Support: Bilateral upper extremity supported;Feet supported Static Sitting - Level of Assistance: 5: Stand by assistance Dynamic Sitting Balance Dynamic Sitting - Balance Support: Bilateral upper extremity supported;Feet supported Dynamic Sitting - Level of Assistance: 4: Min assist Static Standing Balance Static Standing - Balance Support: Bilateral upper extremity supported;During functional activity Static Standing - Level of Assistance: 4: Min assist Dynamic Standing Balance Dynamic Standing - Balance Support: Bilateral upper extremity supported;During functional activity Dynamic Standing - Level of Assistance: 3: Mod assist Dynamic Standing - Balance Activities: Lateral lean/weight shifting Extremity Assessment  RUE Assessment RUE Assessment: Exceptions to [facility] RUE Strength RUE Overall Strength: Deficits RUE Overall Strength Comments: arm flexion 4/5, elbow and grip grossly 5/5 LUE Assessment LUE Assessment: Exceptions to [facility] LUE AROM (degrees) Overall AROM Left Upper Extremity: Deficits;Due to premorbid status LUE Overall AROM Comments: arm flexion grossly 60 degrees from severe osteoarthritis LUE Strength LUE Overall  Strength: Deficits;Due to premorbid status LUE Overall Strength Comments: arm flexion 2+/5, elbow grossly 4/5, grip 5/5 RLE Assessment RLE Assessment: Exceptions to [facility] RLE Strength RLE Overall Strength: Deficits RLE Overall Strength Comments: hip flexion 3+/5, hip adduction 4/5, hip abduction 4/5, knee extension 5/5, knee flexion 4/5, ankle DF 5/5 RLE Tone RLE Tone: Within Functional Limits LLE Assessment LLE Assessment: Exceptions to [facility] LLE Strength LLE Overall Strength: Deficits LLE Overall Strength Comments: hip flexion 3+/5, hip adduction 4/5, hip abduction 4/5, knee extension 5/5, knee flexion 4/5, ankle DF 5/5 LLE Tone LLE Tone: Within Functional Limits  FIM:  FIM - Locomotion: Wheelchair Distance: 25 FIM - Locomotion: Ambulation Ambulation/Gait Assistance: 3: Mod assist   Refer to Care Plan for Long Term Goals  Recommendations for other services: None  Discharge Criteria: Patient will be discharged from PT if patient refuses treatment 3 consecutive times without medical reason, if treatment goals not met, if there is a change in medical status, if patient makes no progress towards goals or if patient is discharged from hospital.  The above assessment, treatment plan, treatment alternatives and goals were discussed and mutually agreed upon: by patient  [facility] M 06/21/2014, 9:34 AM

## 2014-06-21 NOTE — Progress Notes (Signed)
*  PRELIMINARY RESULTS* Vascular Ultrasound Lower extremity venous duplex has been completed.  Preliminary findings: no evidence of DVT. Baker's cyst noted bilaterally.  Landry Mellow, RDMS, RVT  06/21/2014, 10:41 AM

## 2014-06-21 NOTE — Progress Notes (Signed)
PMR Admission Coordinator Pre-Admission Assessment  Patient: Jeremiah Owens is an 80 y.o., male MRN: [id number] DOB: 01-06-1930 Height: 5\' 9"  (175.3 cm) Weight: 73.3 kg (161 lb 9.6 oz)  Insurance Information HMO: PPO: PCP: IPA: 80/20: yes OTHER: No HMO PRIMARY: medicare a and b Policy#: [id number] a Subscriber: pt Benefits: Phone #: palmetto online Name: 06/20/2014 Eff. Date: 10/02/1996 Deduct: $1260 Out of Pocket Max: none Life Max: none CIR: 100% SNF: 20 full days Outpatient: 80% Co-Pay: 20% Home Health: 100% Co-Pay: no copay DME: 80% Co-Pay: 20% Providers: pt choice  SECONDARY: Tricare for life Policy#: [id number] Subscriber: pt  Medicaid Application Date: Case Manager:  Disability Application Date: Case Worker:   Emergency Contact Information Contact Information    Name Relation Home Work Mobile   Klahr Spouse [phone]  [phone]   Jorja Loa [phone]     Averey, Trompeter   [phone]     Current Medical History  Patient Admitting Diagnosis: Metastatic prostate cancer to the thoracic spine s/p decompression adn fusion  History of Present Illness: Jeremiah Owens is a 80 y.o. male with history of prostate cancer diagnosed 2002 for which he underwent radiation treatment. He also has a history of excision of a melanoma from the right neck. Patient was independent with a walker prior to admission living with his wife for past three weeks. Otherwise completely independent up up until first Of November.  Admitted 06/17/2014 with progressive mid back pain and gait disorder as well as pain in the anterior chest bilaterally, greater on the left. Recent cardiac workup unremarkable. MRI of thoracic spine  November 5th revealed spinal cord compression at T5 with enhancing lesion, pathologic fracture at T5, metastatic disease. Underwent thoracic T5 laminectomy for epidural tumor resection, thoracic 4-7 posterior lateral arthrodesis with segmental pedicle screw fixation 06/18/2014 per Dr. Cyndy Freeze. Hospital course pain management. Decadron protocol as advised. Oncology follow-up Dr. Earlie Server and awaiting final path biology report for further recommendations regarding treatment of condition. He is scheduled to have a PET scan after d/c.  Past Medical History  Past Medical History  Diagnosis Date  . Compression fracture   . Prostate cancer   . Melanoma     Family History  family history is not on file.  Prior Rehab/Hospitalizations: none  Current Medications  Current facility-administered medications: Place/Maintain arterial line, , , Until Discontinued **AND** 0.9 % sodium chloride infusion, , Intra-arterial, PRN, Ashok Pall, MD; 0.9 % NaCl with KCl 20 mEq/ L infusion, , Intravenous, Continuous, Ashok Pall, MD, Last Rate: 80 mL/hr at 06/20/14 1145 acetaminophen (TYLENOL) tablet 650 mg, 650 mg, Oral, Q4H PRN **OR** acetaminophen (TYLENOL) suppository 650 mg, 650 mg, Rectal, Q4H PRN, Ashok Pall, MD; bisacodyl (DULCOLAX) suppository 10 mg, 10 mg, Rectal, Daily PRN, Ashok Pall, MD; cholecalciferol (VITAMIN D) tablet 1,000 Units, 1,000 Units, Oral, QHS, Ashok Pall, MD, 1,000 Units at 06/19/14 2224; diazepam (VALIUM) tablet 5 mg, 5 mg, Oral, Q6H PRN, Ashok Pall, MD famotidine (PEPCID) tablet 20 mg, 20 mg, Oral, QHS, Ashok Pall, MD, 20 mg at 06/19/14 2224; HYDROcodone-acetaminophen (NORCO/VICODIN) 5-325 MG per tablet 1-2 tablet, 1-2 tablet, Oral, Q4H PRN, Ashok Pall, MD; hydrocortisone cream 1 %, , Topical, Daily PRN, Ashok Pall, MD; levothyroxine (SYNTHROID, LEVOTHROID) tablet 25 mcg, 25 mcg, Oral, QAC breakfast, Ashok Pall, MD, 25 mcg at 06/20/14  1035 menthol-cetylpyridinium (CEPACOL) lozenge 3 mg, 1 lozenge, Oral, PRN **OR** phenol (CHLORASEPTIC) mouth spray 1 spray, 1 spray, Mouth/Throat, PRN, Ashok Pall, MD, 1 spray at 06/18/14 0637; metroNIDAZOLE (METROCREAM) 0.75 % cream, ,  Topical, PRN, Ashok Pall, MD; morphine 2 MG/ML injection 1-4 mg, 1-4 mg, Intravenous, Q3H PRN, Ashok Pall, MD; ondansetron (ZOFRAN) injection 4 mg, 4 mg, Intravenous, Q4H PRN, Ashok Pall, MD ondansetron (ZOFRAN) tablet 4 mg, 4 mg, Oral, Q6H PRN **OR** [DISCONTINUED] ondansetron (ZOFRAN) injection 4 mg, 4 mg, Intravenous, Q6H PRN, Ashok Pall, MD; oxyCODONE-acetaminophen (PERCOCET/ROXICET) 5-325 MG per tablet 1-2 tablet, 1-2 tablet, Oral, Q4H PRN, Ashok Pall, MD, 1 tablet at 06/20/14 0955; pantoprazole (PROTONIX) EC tablet 80 mg, 80 mg, Oral, Daily, Ashok Pall, MD, 80 mg at 06/20/14 1035 polyethylene glycol (MIRALAX / GLYCOLAX) packet 17 g, 17 g, Oral, Daily PRN, Ashok Pall, MD; polyvinyl alcohol (LIQUIFILM TEARS) 1.4 % ophthalmic solution 1 drop, 1 drop, Both Eyes, PRN, Ashok Pall, MD; senna (SENOKOT) tablet 8.6 mg, 1 tablet, Oral, BID, Ashok Pall, MD, 8.6 mg at 06/20/14 1035; tamsulosin (FLOMAX) capsule 0.4 mg, 0.4 mg, Oral, QHS, Ashok Pall, MD, 0.4 mg at 06/19/14 2222  Patients Current Diet: Diet regular  Precautions / Restrictions Precautions Precautions: Fall, Back Precaution Booklet Issued: No Restrictions Weight Bearing Restrictions: No   Prior Activity Level Community (5-7x/wk): Independent without AD up until first week of November Had just renewed his hunting license for hunts in [place] with friends. Typically completely independent without AD. Driving, etc.  [place] / [place] Devices/Equipment: Cane (specify quad or straight), Communication device (specify type)  Prior Functional Level Prior Function Level of Independence: Needs assistance Gait / Transfers Assistance Needed: declining  ambulation PTA; had begun to use RW a few days PTA  Current Functional Level Cognition  Overall Cognitive Status: Within Functional Limits for tasks assessed Orientation Level: Oriented X4   Extremity Assessment (includes Sensation/Coordination)          ADLs  Overall ADL's : Needs assistance/impaired Eating/Feeding: Independent, Sitting Grooming: Set up, Sitting Upper Body Bathing: Set up, Sitting Lower Body Bathing: Moderate assistance (with +2 min A sit<>stand from raised surface) Upper Body Dressing : Set up, Sitting Lower Body Dressing: Maximal assistance (with +2 min A sit<>stand from raised surface) Toilet Transfer: +2 for physical assistance, Moderate assistance (bil HHA from raised bed to recliner on pt's left) Toileting- Clothing Manipulation and Hygiene: Total assistance (with +2 min A sit<>stand from raised surface)    Mobility  Overal bed mobility: Needs Assistance Bed Mobility: Rolling, Sidelying to Sit Rolling: Supervision Sidelying to sit: Mod assist General bed mobility comments: roll with rail with vc; assist to raise trunk and maintain balance as coming to sit (leans posterior)    Transfers  Overall transfer level: Needs assistance Equipment used: 2 person hand held assist Transfers: Sit to/from Stand, Stand Pivot Transfers Sit to Stand: Min assist, +2 physical assistance, From elevated surface Stand pivot transfers: Mod assist, +2 physical assistance, From elevated surface General transfer comment: to stand x 2; pt self-selects wide BOS and able to "shimmy" his feet closer together; when pivoting to chair, pt's decr coordination ?proprioception caused near scissoring and difficulty placing feet    Ambulation / Gait / Stairs / Wheelchair Mobility       Posture / Balance Dynamic Sitting Balance Sitting balance - Comments: loses balance posteriorly without righting reactions; can identify direction, but requires cues and assist to correct     Special needs/care consideration Bowel mgmt: continent, large 06/20/2014 Bladder mgmt:urinal    Previous Home Environment Living Arrangements: Spouse/significant other Lives With: Spouse Available Help at Discharge: Family, Available 24 hours/day Type of Home: House Home Layout: One level  Home Access: Stairs to enter Entrance Stairs-Rails: None Entrance Stairs-Number of Steps: 1 step from garage, back dorr with one step to porch and then one step into the door Bathroom Shower/Tub: Walk-in shower, Other (comment) (w/c accessible shower) Bathroom Toilet: Handicapped height Bathroom Accessibility: Yes How Accessible: Accessible via wheelchair, Accessible via walker [place]: No Additional Comments: moved to this home 10 years ago. bathroom fully hadicapped shower. Son Surveyor, minerals bars installed throughout bathroom  Discharge Living Setting Plans for Discharge Living Setting: Patient's home, Lives with (comment), Other (Comment) (lives with 60 yo wife; she does not use AD) Type of Home at Discharge: House Discharge Home Layout: One level Discharge Home Access: Stairs to enter Entrance Stairs-Rails: None Entrance Stairs-Number of Steps: 1 step from garage; one step into back door onto porch and then one step into the house Discharge Bathroom Shower/Tub: Walk-in shower Discharge Bathroom Toilet: Handicapped height Discharge Bathroom Accessibility: Yes How Accessible: Accessible via wheelchair, Accessible via walker Does the patient have any problems obtaining your medications?: No  Social/Family/Support Systems Patient Roles: Spouse, Parent Contact Information: [place]th Mcl[place], wife and his sister, Henrene Hawking Anticipated Caregiver: wife Anticipated Caregiver's Contact Information: see above Ability/Limitations of Caregiver: wife 75 years old Caregiver Availability: 24/7 Discharge Plan Discussed with Primary Caregiver: Yes Is Caregiver In Agreement with Plan?:  Yes Does Caregiver/Family have Issues with Lodging/Transportation while Pt is in Rehab?: No  Goals/Additional Needs Patient/Family Goal for Rehab: Mod I to supervision with PT and OT Expected length of stay: ELOS 10 to 15 days Special Service Needs: HOH with B hearing aids Pt/Family Agrees to Admission and willing to participate: Yes Program Orientation Provided & Reviewed with Pt/Caregiver Including Roles & Responsibilities: Yes   Decrease burden of Care through IP rehab admission: n/a  Possible need for SNF placement upon discharge:no   Patient Condition: This patient's condition remains as documented in the consult dated 06/20/2014, in which the Rehabilitation Physician determined and documented that the patient's condition is appropriate for intensive rehabilitative care in an inpatient rehabilitation facility. Will admit to inpatient rehab today.  Preadmission Screen Completed By: Cleatrice Burke, 06/20/2014 11:45 AM ______________________________________________________________________  Discussed status with Dr. Naaman Plummer on 06/20/2014 at 1145 and received telephone approval for admission today.  Admission Coordinator: Cleatrice Burke, time 9191 Date 06/20/2014.          Cosigned by: Meredith Staggers, MD at 06/20/2014 1:04 PM  Revision History     Date/Time User Provider Type Action   06/20/2014 1:04 PM Meredith Staggers, MD Physician Cosign   06/20/2014 11:46 AM Cleatrice Burke, RN Rehab Admission Coordinator Sign

## 2014-06-21 NOTE — Plan of Care (Signed)
Problem: RH SKIN INTEGRITY Goal: RH STG SKIN FREE OF INFECTION/BREAKDOWN Min A  Outcome: Progressing

## 2014-06-21 NOTE — Progress Notes (Signed)
Occupational Therapy Assessment and Plan  Patient Details  Name: Jeremiah Owens MRN: [id number] Date of Birth: March 02, 1930  OT Diagnosis: abnormal posture, acute pain, ataxia, muscle weakness (generalized) and pain in thoracic spine Rehab Potential: Rehab Potential (ACUTE ONLY): Good ELOS: 10-14 days   Today's Date: 06/21/2014 OT Individual Time: 5053-9767 OT Individual Time Calculation (min): 60 min     Problem List:  Patient Active Problem List   Diagnosis Date Noted  . Paraplegia 06/21/2014  . Postoperative anemia due to acute blood loss 06/21/2014  . 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 Spine metastasis 06/17/2014  . Thoracic spine tumor 06/17/2014  . Metastatic bone cancer 06/15/2014    Past Medical History:  Past Medical History  Diagnosis Date  . Compression fracture   . Prostate cancer   . Melanoma    Past Surgical History:  Past Surgical History  Procedure Laterality Date  . Hernia repair    . Posterior cervical fusion/foraminotomy N/A 06/17/2014    Procedure: Thoracic five laminectomy for epidural tumor resection Thoracic 4-7 posterior lateral arthrodesis, segmental pedicle screw fixation.;  Surgeon: Ashok Pall, MD;  Location: [facility];  Service: Neurosurgery;  Laterality: N/A;    Assessment & Plan Clinical Impression: Terek Bee is a 80 y.o. male with history of prostate cancer diagnosed 2002 for which he underwent radiation treatment. He also has a history of excision of a melanoma from the right neck. Patient was independent with a walker prior to admission living with his wife. Admitted 06/17/2014 with progressive mid back pain and gait disorder as well as pain in the anterior chest bilaterally, greater on the left. Recent cardiac workup unremarkable. MRI of thoracic spine November 5th revealed spinal cord compression at T5 with enhancing lesion, pathologic fracture at T5, metastatic disease. Underwent thoracic T5 laminectomy for  epidural tumor resection, thoracic 4-7 posterior lateral arthrodesis with segmental pedicle screw fixation 06/18/2014 per Dr. Cyndy Freeze. Hospital course pain management. Decadron protocol as advised and completed. Oncology follow-up Dr. Earlie Server and awaiting final path biology report for further recommendations regarding treatment of condition. He is scheduled to have a PET scan as an outpatient. Physical and occupational therapy evaluation completed 06/19/2014 with recommendations of physical medicine rehabilitation consult. Patient was admitted for a comprehensive rehabilitation program.  Patient transferred to CIR on 06/20/2014 .    Patient currently requires total assist LB self-care, mod assist bathing and UB dressing, and mod assist  with functional transfers secondary to muscle weakness, decreased cardiorespiratoy endurance, motor apraxia, decreased coordination and decreased motor planning, decreased motor planning and decreased standing balance, decreased postural control, decreased balance strategies and difficulty maintaining precautions.  Prior to hospitalization, patient could complete BADLs with modified independent .  Patient will benefit from skilled intervention to increase independence with basic self-care skills prior to discharge home with care partner.  Anticipate patient will require intermittent supervision and follow up home health.  OT - End of Session Endurance Deficit: Yes Endurance Deficit Description: frequent rest breaks OT Assessment Rehab Potential (ACUTE ONLY): Good OT Patient demonstrates impairments in the following area(s): Balance;Perception;Safety;Sensory;Endurance;Motor OT Basic ADL's Functional Problem(s): Grooming;Bathing;Dressing;Toileting OT Advanced ADL's Functional Problem(s): Simple Meal Preparation OT Transfers Functional Problem(s): Toilet;Tub/Shower OT Additional Impairment(s): Fuctional Use of Upper Extremity (LUE limited by osteoarthritis) OT Plan OT  Intensity: Minimum of 1-2 x/day, 45 to 90 minutes OT Frequency: 5 out of 7 days OT Duration/Estimated Length of Stay: 10-14 days OT Treatment/Interventions: Balance/vestibular training;Discharge planning;Community reintegration;DME/adaptive equipment instruction;Functional mobility training;Neuromuscular re-education;Psychosocial support;Patient/family education;Self  Care/advanced ADL retraining;Therapeutic Exercise;Therapeutic Activities;UE/LE Strength taining/ROM;UE/LE Coordination activities;Wheelchair propulsion/positioning OT Self Feeding Anticipated Outcome(s): n/a OT Basic Self-Care Anticipated Outcome(s): supervision-Mod I OT Toileting Anticipated Outcome(s): supervision  OT Bathroom Transfers Anticipated Outcome(s): Mod I OT Recommendation Patient destination: Home Follow Up Recommendations: Home health OT Equipment Recommended: To be determined   Skilled Therapeutic Intervention OT eval completed with wife and son present. Educated on role of OT, goals of therapy, precautions (provided handout), ELOS, follow-up, and DME. Pt completed bathing and dressing sitting in recliner chair. Pt required min cues for adherence to precautions during self-care tasks. Completed sit<>stand 4x during session with min-mod assist and mod assist overall for standing balance during clothing management and hygiene. Pt stood up to 40 seconds before requiring rest breaks. At end of session, pt left sitting in recliner with all needs in reach. No questions at this time.   OT Evaluation Precautions/Restrictions  Precautions Precautions: Fall;Back Precaution Booklet Issued: No Restrictions Weight Bearing Restrictions: No General   Vital Signs Therapy Vitals Temp: 97.8 F (36.6 C) Temp Source: Oral Pulse Rate: 80 Resp: 18 BP: 126/66 mmHg Patient Position (if appropriate): Lying Oxygen Therapy SpO2: 100 % O2 Device: Not Delivered Pain Pain Assessment Pain Score: 6  Home Living/Prior  Functioning Home Living Available Help at Discharge: Family, Available 24 hours/day Type of Home: House Home Access: Stairs to enter [employer] of Steps: 1 step from garage, back dorr with one step to porch and then one step into the door Entrance Stairs-Rails: None Home Layout: One level Additional Comments: moved to this home 10 years ago. bathroom fully hadicapped shower. Son Surveyor, minerals bars installed throughout bathroom  Lives With: Spouse Prior Function Level of Independence: Independent with basic ADLs, Requires assistive device for independence Driving: Yes ADL   Vision/Perception  Vision- History Baseline Vision/History: Wears glasses Wears Glasses: At all times Patient Visual Report: No change from baseline Vision- Assessment Vision Assessment?: No apparent visual deficits Perception Comments: wfl Praxis Praxis-Other Comments: mild ataxia in LLE with open chain movements  Cognition Overall Cognitive Status: Within Functional Limits for tasks assessed Arousal/Alertness: Awake/alert Orientation Level: Oriented X4 Attention: Sustained;Selective Focused Attention: Appears intact Sustained Attention: Appears intact Selective Attention: Appears intact Memory: Impaired Memory Impairment: Decreased recall of new information Awareness: Appears intact Problem Solving: Impaired Problem Solving Impairment: Functional complex Safety/Judgment: Appears intact Comments: pt is verbose Sensation Sensation Light Touch: Impaired Detail Light Touch Impaired Details: Impaired RLE;Impaired LLE (BUE intact) Stereognosis: Not tested Hot/Cold: Appears Intact Proprioception: Impaired Detail Proprioception Impaired Details: Impaired RLE;Impaired LLE Additional Comments: feet feel "tight", difficulty in B ankle proprioception "hard to tell" Coordination Gross Motor Movements are Fluid and Coordinated: No Fine Motor Movements are Fluid and Coordinated: Yes Coordination and  Movement Description: ataxia in B legs noted Finger Nose Finger Test: slight end point/destination difficulty and intention tremor bilaterally Heel Shin Test: impaired  control more L side > R side Motor  Motor Motor: Ataxia Motor - Skilled Clinical Observations: in open chain B LE motions: L side more impaired than R Mobility  Bed Mobility Bed Mobility: Supine to Sit Rolling Right: 4: Min guard Rolling Right Details: Verbal cues for technique Rolling Right Details (indicate cue type and reason): logroll technique Rolling Left: 4: Min guard Left Sidelying to Sit: 4: Min assist Left Sidelying to Sit Details: Manual facilitation for placement;Verbal cues for technique Left Sidelying to Sit Details (indicate cue type and reason): avoid twisting of the spine while pushing up Supine to Sit: 3: Mod  assist Supine to Sit Details: Tactile cues for weight shifting;Verbal cues for sequencing;Verbal cues for precautions/safety;Manual facilitation for weight shifting Sit to Sidelying Left: 3: Mod assist Sit to Sidelying Left Details: Manual facilitation for placement Sit to Sidelying Left Details (indicate cue type and reason): B LE assist from PT Transfers Transfers: Sit to Stand;Stand to Sit Sit to Stand: 3: Mod assist Sit to Stand Details: Manual facilitation for placement;Verbal cues for technique Stand to Sit: 3: Mod assist Stand to Sit Details (indicate cue type and reason): Manual facilitation for placement;Verbal cues for technique  Trunk/Postural Assessment  Cervical Assessment Cervical Assessment: Exceptions to [facility] Cervical AROM Overall Cervical AROM: Deficits;Due to premorid status Overall Cervical AROM Comments: significant forward head Thoracic Assessment Thoracic Assessment: Exceptions to [facility] Thoracic AROM Overall Thoracic AROM: Deficits;Due to precautions Lumbar Assessment Lumbar Assessment: Within Functional Limits Postural Control Postural Control: Deficits on  evaluation Protective Responses: delayed  Balance Balance Balance Assessed: Yes Static Sitting Balance Static Sitting - Balance Support: Bilateral upper extremity supported;Feet supported Static Sitting - Level of Assistance: 5: Stand by assistance Dynamic Sitting Balance Dynamic Sitting - Balance Support: Bilateral upper extremity supported;Feet supported;During functional activity Dynamic Sitting - Level of Assistance: 5: Stand by assistance Static Standing Balance Static Standing - Balance Support: Bilateral upper extremity supported;During functional activity Static Standing - Level of Assistance: 4: Min assist Dynamic Standing Balance Dynamic Standing - Balance Support: Bilateral upper extremity supported;During functional activity Dynamic Standing - Level of Assistance: 3: Mod assist Dynamic Standing - Balance Activities: Lateral lean/weight shifting Extremity/Trunk Assessment RUE Assessment RUE Assessment: Within Functional Limits RUE Strength RUE Overall Strength: Deficits RUE Overall Strength Comments: grossly 4/5 strength LUE Assessment LUE Assessment: Exceptions to WFL LUE AROM (degrees) Overall AROM Left Upper Extremity: Deficits;Due to premorbid status LUE Overall AROM Comments: limited by osteoarthritis; mainly limited at shoulder with grossly 45* shoulder flexion LUE Strength LUE Overall Strength: Deficits;Due to premorbid status LUE Overall Strength Comments: arm flexion 2+/5, elbow grossly 4/5, grip 5/5  FIM:  FIM - Grooming Grooming Steps: Wash, rinse, dry face;Wash, rinse, dry hands;Oral care, brush teeth, clean dentures;Brush, comb hair Grooming: 5: Set-up assist to obtain items (from w/c) FIM - Bathing Bathing Steps Patient Completed: Right Arm;Chest;Left Arm;Left upper leg;Abdomen;Right upper leg;Front perineal area Bathing: 3: Mod-Patient completes 5-7 [id number] parts or 50-74% FIM - Upper Body Dressing/Undressing Upper body dressing/undressing steps  patient completed: Thread/unthread right sleeve of pullover shirt/dresss;Thread/unthread left sleeve of pullover shirt/dress Upper body dressing/undressing: 3: Mod-Patient completed 50-74% of tasks FIM - Lower Body Dressing/Undressing Lower body dressing/undressing steps patient completed: Pull underwear up/down Lower body dressing/undressing: 1: Total-Patient completed less than 25% of tasks FIM - BControl and instrumentation engineerDevices: Arm rests;Walker Bed/Chair Transfer: 4: Supine > Sit: Min A (steadying Pt. > 75%/lift 1 leg);3: Bed > Chair or W/C: Mod A (lift or lower assist) FIM - TRadio producerDevices: Bedside commode;Grab bars Toilet Transfers: 4-To toilet/BSC: Min A (steadying Pt. > 75%)   Refer to Care Plan for Long Term Goals  Recommendations for other services: None  Discharge Criteria: Patient will be discharged from OT if patient refuses treatment 3 consecutive times without medical reason, if treatment goals not met, if there is a change in medical status, if patient makes no progress towards goals or if patient is discharged from hospital.  The above assessment, treatment plan, treatment alternatives and goals were discussed and mutually agreed upon: by patient and by family  Raeven Pint, KQuillian Quince  06/21/2014, 7:31 AM

## 2014-06-21 NOTE — Progress Notes (Signed)
Occupational Therapy Session Note  Patient Details  Name: Jeremiah Owens MRN: [id number] Date of Birth: October 17, 1929  Today's Date: 06/21/2014 OT Individual Time: 1300-1400 OT Individual Time Calculation (min): 60 min   Short Term Goals: Week 1:  OT Short Term Goal 1 (Week 1): Pt will complete toilet transfer at min assist level OT Short Term Goal 2 (Week 1): Pt will complete toilet task with min assist and min cues for adherence to precuations OT Short Term Goal 3 (Week 1): Pt will complete LB dressing with mod assist and use of AE OT Short Term Goal 4 (Week 1): Pt will complete self-care task in standing for 1 min with min assist for balance  Skilled Therapeutic Interventions/Progress Updates:  Patient resting in recliner upon arrival with son and wife present yet on their way out the door.  Engaged in review of back precautions both verbally and functionally, LB dressing, use of AE to improve independence and decrease possibility of breaking back precautions, edema management in bilateral feet, sit><stand, ambulate to bathroom for toilet transfer and toileting.  Reviewed back precaution handout provided by morning OT, patient able to state "BAT" yet only could recall 2/3 precautions.  In depth discussion related to how often we bend, arch and twist during BADL and IADL tasks then patient return demonstrated techniques to maintain these back precautions during all mobility, transitional movements, LB dressing, toileting and toilet transfer.  Reviewed use of LH sponge, reacher, sock aid and LH shoe horn.  Patient return demonstrated use of sock aid with only 1-2 cues.  Patient reports that he will probably have his wife purchase these items in the gift shop.  Patient required mod-max assist for sit>stand from recliner then he ambulated to bathroom with RW and mod-total assist when knees would unexpectedly buckle during the walk.  Patient able to perform clothes down and left on commode for hand off to  PT.  Therapy Documentation Precautions:  Precautions Precautions: Fall, Back Precaution Booklet Issued: No Restrictions Weight Bearing Restrictions: No Pain: 0/10 at rest yet c/o pain in mid-upper back during seated LB dressing tasks.  Pain not rated, rest, repositioned. See FIM for current functional status  Therapy/Group: Individual Therapy  Emmanual Gauthreaux, [place] 06/21/2014, 2:33 PM

## 2014-06-21 NOTE — Progress Notes (Signed)
Patient information reviewed and entered into eRehab system by Daleen Steinhaus, RN, CRRN, PPS Coordinator.  Information including medical coding and functional independence measure will be reviewed and updated through discharge.     Per nursing patient was given "Data Collection Information Summary for Patients in Inpatient Rehabilitation Facilities with attached "Privacy Act Statement-Health Care Records" upon admission.  

## 2014-06-21 NOTE — Progress Notes (Addendum)
[facility] PHYSICAL MEDICINE & REHABILITATION     PROGRESS NOTE    Subjective/Complaints: Mild back pain last night. Able to sleep. Feet feel tight. No sob, cp A  review of systems has been performed and if not noted above is otherwise negative.   Objective: Vital Signs: Blood pressure 126/66, pulse 80, temperature 97.8 F (36.6 C), temperature source Oral, resp. rate 18, weight 77.1 kg (169 lb 15.6 oz), SpO2 100 %. No results found.  Recent Labs  06/21/14 0406  WBC 7.6  HGB 9.6*  HCT 28.5*  PLT 134*    Recent Labs  06/21/14 0406  NA 138  K 3.7  CL 104  GLUCOSE 106*  BUN 26*  CREATININE 0.89  CALCIUM 8.1*   CBG (last 3)  No results for input(s): GLUCAP in the last 72 hours.  Wt Readings from Last 3 Encounters:  06/21/14 77.1 kg (169 lb 15.6 oz)  06/17/14 73.3 kg (161 lb 9.6 oz)  06/15/14 74.844 kg (165 lb)    Physical Exam:  HENT: dentition good Head: Normocephalic.  Right Ear: External ear normal.  Left Ear: External ear normal.  Eyes: Conjunctivae and EOM are normal. Pupils are equal, round, and reactive to light. Right eye exhibits no discharge. Left eye exhibits no discharge.  Neck: Normal range of motion. Neck supple. No JVD present. No tracheal deviation present. No thyromegaly present.  Cardiovascular: Normal rate, regular rhythm and normal heart sounds.  Respiratory: Effort normal and breath sounds normal. No respiratory distress. He has no wheezes. He has no rales. He exhibits no tenderness.  GI: Soft. Bowel sounds are normal. He exhibits no distension. There is no tenderness. There is no rebound.  Musculoskeletal:  Upper back/mid back pain  Lymphadenopathy:   He has no cervical adenopathy.  Neurological: He is alert and oriented to person, place, and time.  Follows commands. No cn findings. Left shoulder limited due to pain/ tightness. Otherwise UE 4/5. LE: HF 3/5. KE 3/5, ADF/APF 4/5. Decreased LT/proprioception in both feet. No resting  tone.  Skin: Skin is warm.  Back incision is dressed, clean with honeycomb dressing Psychiatric: He has a normal mood and affect. His behavior is normal. Judgment and thought content normal  Assessment/Plan: 1. 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 Physiatrist is providing close team supervision and 24 hour management of active medical problems listed below. Physiatrist and rehab team continue to assess barriers to discharge/monitor patient progress toward functional and medical goals. FIM:                   Comprehension Comprehension Mode: Auditory Comprehension: 5-Follows basic conversation/direction: With no assist  Expression Expression Mode: Verbal Expression: 5-Expresses basic needs/ideas: With extra time/assistive device  Social Interaction Social Interaction: 4-Interacts appropriately 75 - 89% of the time - Needs redirection for appropriate language or to initiate interaction.  Problem Solving Problem Solving: 4-Solves basic 75 - 89% of the time/requires cueing 10 - 24% of the time  Memory Memory: 5-Recognizes or recalls 90% of the time/requires cueing < 10% of the time  Medical Problem List and Plan: 1. Functional deficits secondary to metastatic prostate cancer to thoracic spine status post decompression and fusion T4-7 06/18/2014 2. DVT Prophylaxis/Anticoagulation: SCDs. Check vascular study today 3. Pain Management: Hydrocodone and Robaxin as needed. Monitor with increased mobility  -controlled at present 4. Hypothyroidism. Synthroid 5. Neuropsych:  This patient is capable of making decisions on his own behalf. 6. Skin/Wound Care: routine skin checks 7. Fluids/Electrolytes/Nutrition: strict I and O's. Follow-up chemistries. Provide nutritional supplements as needed. 8.BPH/history of prostate  cancer. Flomax 0.4 mg daily at bedtime. Checking PVR 3 9. Bowels: moving well, probably can back off senna for now 10. ABLA: labs reviewed. Encourage po intake  LOS (Days) 1 A FACE TO FACE EVALUATION WAS PERFORMED  Jeremiah Owens T 06/21/2014 7:23 AM

## 2014-06-21 NOTE — Progress Notes (Signed)
Physical Medicine and Rehabilitation Consult Reason for Consult:metastatic thoracic tumor Referring Physician: Dr. Christella Noa   HPI: Jeremiah Owens is a 80 y.o. male with history of prostate cancer diagnosed 2002 for which he underwent radiation treatment. He also has a history of excision of a melanoma from the right neck. Patient was independent with a walker prior to admission living with his wife. Admitted 06/17/2014 with progressive mid back pain and gait disorder as well as pain in the anterior chest bilaterally, greater on the left. Recent cardiac workup unremarkable. MRI of thoracic spine November 5th revealed spinal cord compression at T5 with enhancing lesion, pathologic fracture at T5, metastatic disease. Underwent thoracic T5 laminectomy for epidural tumor resection, thoracic 4-7 posterior lateral arthrodesis with segmental pedicle screw fixation 06/18/2014 per Dr. Cyndy Freeze. Hospital course pain management. Decadron protocol as advised. Oncology follow-up Dr. Earlie Server and awaiting final path biology report for further recommendations regarding treatment of condition. He is scheduled to have a PET scan. Physical and occupational therapy evaluation completed 06/19/2014 with recommendations of physical medicine rehabilitation consult.   Review of Systems  Gastrointestinal: Positive for constipation.  Genitourinary: Positive for urgency.  Musculoskeletal: Positive for myalgias and back pain.  Neurological: Positive for weakness.  All other systems reviewed and are negative.  Past Medical History  Diagnosis Date  . Compression fracture   . Prostate cancer   . Melanoma    Past Surgical History  Procedure Laterality Date  . Hernia repair     History reviewed. No pertinent family history. Social History:  reports that he has never smoked. He does not have any smokeless tobacco history on file. He reports that he does not drink alcohol or use illicit drugs. Allergies:   Allergies  Allergen Reactions  . Iodine Itching    Patient had a reaction 20 years ago and can't remember what kind of reaction he had. Patient was given decadron in the ER and had an Angio ordered and did test with no problems. Bottom line "patient needs to pre-medicated before any IV dye is given".   Medications Prior to Admission  Medication Sig Dispense Refill  . celecoxib (CELEBREX) 200 MG capsule Take 200 mg by mouth every other day.     . cholecalciferol (VITAMIN D) 1000 UNITS tablet Take 1,000 Units by mouth at bedtime.     . Glucosamine-Chondroit-Vit C-Mn (GLUCOSAMINE 1500 COMPLEX PO) Take 2 tablets by mouth daily after breakfast.     . HYDROcodone-acetaminophen (NORCO/VICODIN) 5-325 MG per tablet Take 1-2 tablets by mouth every 4 (four) hours as needed for moderate pain.    Marland Kitchen HYDROCORTISONE, TOPICAL, 2.5 % SOLN Apply 1 application topically daily as needed.     Marland Kitchen levothyroxine (SYNTHROID, LEVOTHROID) 25 MCG tablet Take 25 mcg by mouth daily.  4  . metroNIDAZOLE (METROCREAM) 0.75 % cream Apply 1 application topically See admin instructions. Every time pt washes face, he put a small amount on his nose.    . Multiple Vitamins-Minerals (CENTRUM SILVER PO) Take 1 tablet by mouth daily.    Marland Kitchen omeprazole (PRILOSEC) 40 MG capsule Take 40 mg by mouth daily. In the afternoon  11  . Polyvinyl Alcohol-Povidone (REFRESH OP) Apply 1 drop to eye daily as needed (dry/irritated eyes).    . ranitidine (ZANTAC) 150 MG tablet Take 150 mg by mouth at bedtime.   11  . tamsulosin (FLOMAX) 0.4 MG CAPS capsule Take 0.4 mg by mouth at bedtime.   3  . dexamethasone (DECADRON) 4 MG tablet Take 1.5 tablets (  6 mg total) by mouth 4 (four) times daily. 60 tablet 0    Home: Home Living Family/patient expects to be discharged to:: Inpatient rehab Additional Comments: lives with wife; has RW with seat;   Functional History: Prior  Function Level of Independence: Needs assistance Gait / Transfers Assistance Needed: declining ambulation PTA; had begun to use RW a few days PTA Functional Status:  Mobility: Bed Mobility Overal bed mobility: Needs Assistance Bed Mobility: Rolling, Sidelying to Sit Rolling: Supervision Sidelying to sit: Mod assist General bed mobility comments: roll with rail with vc; assist to raise trunk and maintain balance as coming to sit (leans posterior) Transfers Overall transfer level: Needs assistance Equipment used: 2 person hand held assist Transfers: Sit to/from Stand, Stand Pivot Transfers Sit to Stand: Min assist, +2 physical assistance, From elevated surface Stand pivot transfers: Mod assist, +2 physical assistance, From elevated surface General transfer comment: to stand x 2; pt self-selects wide BOS and able to "shimmy" his feet closer together; when pivoting to chair, pt's decr coordination ?proprioception caused near scissoring and difficulty placing feet      ADL: ADL Overall ADL's : Needs assistance/impaired Eating/Feeding: Independent, Sitting Grooming: Set up, Sitting Upper Body Bathing: Set up, Sitting Lower Body Bathing: Moderate assistance (with +2 min A sit<>stand from raised surface) Upper Body Dressing : Set up, Sitting Lower Body Dressing: Maximal assistance (with +2 min A sit<>stand from raised surface) Toilet Transfer: +2 for physical assistance, Moderate assistance (bil HHA from raised bed to recliner on pt's left) Toileting- Clothing Manipulation and Hygiene: Total assistance (with +2 min A sit<>stand from raised surface)  Cognition: Cognition Overall Cognitive Status: Within Functional Limits for tasks assessed Orientation Level: Oriented X4 Cognition Arousal/Alertness: Awake/alert Behavior During Therapy: WFL for tasks assessed/performed Overall Cognitive Status: Within Functional Limits for tasks assessed  Blood pressure 117/59, pulse 83, temperature  97 F (36.1 C), temperature source Oral, resp. rate 17, height 5\' 9"  (1.753 m), weight 73.3 kg (161 lb 9.6 oz), SpO2 96 %. Physical Exam  Constitutional: He is oriented to person, place, and time. He appears well-developed and well-nourished.  HENT:  Head: Normocephalic.  Right Ear: External ear normal.  Left Ear: External ear normal.  Eyes: Conjunctivae and EOM are normal. Pupils are equal, round, and reactive to light. Right eye exhibits no discharge. Left eye exhibits no discharge.  Neck: Normal range of motion. Neck supple. No JVD present. No tracheal deviation present. No thyromegaly present.  Cardiovascular: Normal rate, regular rhythm and normal heart sounds.  Respiratory: Effort normal and breath sounds normal. No respiratory distress. He has no wheezes. He has no rales. He exhibits no tenderness.  GI: Soft. Bowel sounds are normal. He exhibits no distension. There is no tenderness. There is no rebound.  Musculoskeletal:  Upper back/mid back pain  Lymphadenopathy:   He has no cervical adenopathy.  Neurological: He is alert and oriented to person, place, and time.  Follows commands. No cn findings. Left shoulder limited due to pain/ tightness. Otherwise UE 4/5. LE: HF 3/5. KE 3/5, ADF/APF 4/5. No obvious sensoryu findings. No resting tone.  Skin: Skin is warm.  Back incision is dressed  Psychiatric: He has a normal mood and affect. His behavior is normal. Judgment and thought content normal.     Lab Results Last 24 Hours    No results found for this or any previous visit (from the past 24 hour(s)).    Imaging Results (Last 48 hours)    No results found.  Assessment/Plan: Diagnosis: metatstatic prostate cancer to the thoracic spine s/p decompression and fusion 1. Does the need for close, 24 hr/day medical supervision in concert with the patient's rehab needs make it unreasonable for this patient to be served in a less intensive setting? Yes 2. Co-Morbidities requiring  supervision/potential complications: pain, wound care 3. Due to bladder management, bowel management, safety, skin/wound care, disease management, medication administration, pain management and patient education, does the patient require 24 hr/day rehab nursing? Yes 4. Does the patient require coordinated care of a physician, rehab nurse, PT (1-2 hrs/day, 5 days/week) and OT (1-2 hrs/day, 5 days/week) to address physical and functional deficits in the context of the above medical diagnosis(es)? Yes Addressing deficits in the following areas: balance, endurance, locomotion, strength, transferring, bowel/bladder control, bathing, dressing, feeding, grooming, cognition and psychosocial support 5. Can the patient actively participate in an intensive therapy program of at least 3 hrs of therapy per day at least 5 days per week? Yes 6. The potential for patient to make measurable gains while on inpatient rehab is excellent 7. Anticipated functional outcomes upon discharge from inpatient rehab are modified independent and supervision with PT, modified independent and supervision with OT, n/a with SLP. 8. Estimated rehab length of stay to reach the above functional goals is: 10-15 days 9. Does the patient have adequate social supports and living environment to accommodate these discharge functional goals? Yes 10. Anticipated D/C setting: Home 11. Anticipated post D/C treatments: [facility] 12. Overall Rehab/Functional Prognosis: excellent  RECOMMENDATIONS: This patient's condition is appropriate for continued rehabilitative care in the following setting: CIR Patient has agreed to participate in recommended program. Yes Note that insurance prior authorization may be required for reimbursement for recommended care.  Comment: Rehab Admissions Coordinator to follow up.  Thanks,  Meredith Staggers, MD, [facility]: [place] Rhonda W     06/20/2014       Revision History     Date/Time User Provider Type Action    06/20/2014 9:24 AM Meredith Staggers, MD Physician Sign   06/20/2014 6:20 AM Cathlyn Parsons, PA-C Physician Assistant Pend   View Details Report       Routing History     Date/Time From To Method   06/20/2014 9:24 AM Meredith Staggers, MD Meredith Staggers, MD In Basket   06/20/2014 9:24 AM Meredith Staggers, MD Wenda Low, MD Fax

## 2014-06-22 ENCOUNTER — Inpatient Hospital Stay (HOSPITAL_COMMUNITY): Payer: Medicare Other

## 2014-06-22 ENCOUNTER — Inpatient Hospital Stay (HOSPITAL_COMMUNITY): Payer: Medicare Other | Admitting: Occupational Therapy

## 2014-06-22 ENCOUNTER — Ambulatory Visit (HOSPITAL_COMMUNITY): Admission: RE | Admit: 2014-06-22 | Payer: Medicare Other | Source: Ambulatory Visit

## 2014-06-22 ENCOUNTER — Inpatient Hospital Stay (HOSPITAL_COMMUNITY): Payer: Medicare Other | Admitting: Physical Therapy

## 2014-06-22 DIAGNOSIS — C9000 Multiple myeloma not having achieved remission: Secondary | ICD-10-CM | POA: Diagnosis not present

## 2014-06-22 MED ORDER — SODIUM CHLORIDE 0.9 % IV SOLN
Freq: Once | INTRAVENOUS | Status: AC
  Administered 2014-06-23: 06:00:00 via INTRAVENOUS

## 2014-06-22 NOTE — Progress Notes (Signed)
Occupational Therapy Session Note  Patient Details  Name: Jeremiah Owens MRN: [id number] Date of Birth: 1930/07/24  Today's Date: 06/22/2014 OT Individual Time: 1400-1500 OT Individual Time Calculation (min): 60 min    Short Term Goals: Week 1:  OT Short Term Goal 1 (Week 1): Pt will complete toilet transfer at min assist level OT Short Term Goal 2 (Week 1): Pt will complete toilet task with min assist and min cues for adherence to precuations OT Short Term Goal 3 (Week 1): Pt will complete LB dressing with mod assist and use of AE OT Short Term Goal 4 (Week 1): Pt will complete self-care task in standing for 1 min with min assist for balance  Skilled Therapeutic Interventions/Progress Updates:    Pt performed functional transfers during OT session.  He needed mod assist to transfer from wheelchair to the EOB in the ADL apartment.  He needed mod demonstrational cueing to use correct technique for sit to stand including hand placement.  During transfers pt continues to demonstrate decreased awareness of the placement of his LEs as he lacks adequate proprioception.  Decreased motor control of the LEs when advancing them as well as increased knee buckling at times.  Pt educated on bed mobility getting into and out of the bed adhering to his back precautions.  He was able to perform with min assist to lift the LEs into the bed but he was able to transition them off of the bed without assistance and transition back to sitting.  Mod assist for sit to stand from the EOB.  Performed second transfer with the RW to the couch with mod assist and then back to the wheelchair.  He finished session with transfer to the 3:1 over the toilet with mod assist.  He was able to manage clothing alternating UEs in standing with min assist as well.  Pt left on toilet with call bell in place.  Nursing tech made aware.   Therapy Documentation Precautions:  Precautions Precautions: Fall, Back Precaution Booklet Issued:  No Precaution Comments: Reviewed back precautions Restrictions Weight Bearing Restrictions: No  Pain: Pain Assessment Pain Assessment: Faces Pain Score: 7  Faces Pain Scale: Hurts little more Pain Type: Acute pain Pain Location: Back Pain Orientation: Mid Pain Descriptors / Indicators: Aching Pain Onset: On-going Pain Intervention(s): Repositioned;Emotional support Multiple Pain Sites: No ADL: See FIM for current functional status  Therapy/Group: Individual Therapy  Theo Krumholz OTR/L 06/22/2014, 4:10 PM

## 2014-06-22 NOTE — Progress Notes (Signed)
Occupational Therapy Session Note  Patient Details  Name: Jeremiah Owens MRN: [id number] Date of Birth: [birth year]  Today's Date: 06/22/2014 OT Individual Time: 0800-0900 OT Individual Time Calculation (min): 60 min    Short Term Goals: Week 1:  OT Short Term Goal 1 (Week 1): Pt will complete toilet transfer at min assist level OT Short Term Goal 2 (Week 1): Pt will complete toilet task with min assist and min cues for adherence to precuations OT Short Term Goal 3 (Week 1): Pt will complete LB dressing with mod assist and use of AE OT Short Term Goal 4 (Week 1): Pt will complete self-care task in standing for 1 min with min assist for balance  Skilled Therapeutic Interventions/Progress Updates:    Pt performed bathing and dressing sit to stand at the sink this session.  Min assist for most sit to stand transitions however pt does exhibit LOB posteriorly as well as some knee buckling when standing to pull up pants or wash peri area.  Multiple rest breaks needed secondary to back pain.  Meds brought during session.  Pt able to state 2/3 back precautions but needed cueing to recall not to arch.  Integrated AE into therapy session with pt also being issued LH sponge for washing his LEs.  Mod demonstrational cueing for techniques to utilize the reacher for removing pants and underpants as well as gripper socks.  He was able to donn brief and pants using the reacher with min assist once he finished bathing.  Therapist set up sockaide and he donned the socks secondary to time constraints.  Pt finished grooming activities from wheelchair level.   Therapy Documentation Precautions:  Precautions Precautions: Fall, Back Precaution Booklet Issued: No Precaution Comments: Reviewed back precautions Restrictions Weight Bearing Restrictions: No  Pain: Pain Assessment Pain Assessment: 0-10 Pain Score: 8  Pain Type: Surgical pain Pain Location: Back Pain Orientation: Mid Pain Intervention(s):  Medication (See eMAR);Repositioned;RN made aware Multiple Pain Sites: No ADL: See FIM for current functional status  Therapy/Group: Individual Therapy  Dariela Stoker OTR/L 06/22/2014, 9:34 AM

## 2014-06-22 NOTE — Progress Notes (Signed)
Occupational Therapy Session Note  Patient Details  Name: Jeremiah Owens MRN: [id number] Date of Birth: Jan 26, 1930  Today's Date: 06/22/2014 OT Individual Time: 0930-1030 OT Individual Time Calculation (min): 60 min    Short Term Goals: Week 1:  OT Short Term Goal 1 (Week 1): Pt will complete toilet transfer at min assist level OT Short Term Goal 2 (Week 1): Pt will complete toilet task with min assist and min cues for adherence to precuations OT Short Term Goal 3 (Week 1): Pt will complete LB dressing with mod assist and use of AE OT Short Term Goal 4 (Week 1): Pt will complete self-care task in standing for 1 min with min assist for balance  Skilled Therapeutic Interventions/Progress Updates: Therapeutic exercises with emphasis on improved BUE strength, endurance, improved unsupported sitting posture (core strengthening), and transfers.   Pt received in his w/c with spouse in room.   Pt reported completion of bathing and dressing prior to session and expressed desire to improve general strength and endurance.   OT educated pt on benefit of arm ergometry for cardiorespiratory conditioning and BUE HEP using theraband.    Pt performed therex as presented but required modification of HEP d/t left shoulder impairment.   Pt sustained unsupported sitting for 5 min before reporting fatigue while exercising seated on raised mat.  Pt was returned to his room at end of session with spouse and clergy member present.       Therapy Documentation Precautions:  Precautions Precautions: Fall, Back Precaution Booklet Issued: No Precaution Comments: Reviewed back precautions Restrictions Weight Bearing Restrictions: No   Pain: Pain Assessment Pain Assessment: 0-10 Pain Score: 2  Pain Type: Surgical pain Pain Location: Back Pain Orientation: Mid Pain Descriptors / Indicators: Aching Pain Frequency: Intermittent Pain Onset: Gradual Patients Stated Pain Goal: 2 Pain Intervention(s): Medication (See  eMAR);Repositioned;RN made aware Multiple Pain Sites: No  Exercises: Cardiovascular Exercises Upper Body Ergometer: Sci-Fit, 15 min, level 1, random Trunk/Core Exercises Crunches: Seated General Exercises - Upper Extremity Shoulder Horizontal ABduction: 10 reps;Both;Strengthening Other Exercises Other Exercises: Chest press, seated, band  See FIM for current functional status  Therapy/Group: Individual Therapy  Ocie Tino 06/22/2014, 10:46 AM

## 2014-06-22 NOTE — Plan of Care (Signed)
Problem: RH BOWEL ELIMINATION Goal: RH STG MANAGE BOWEL WITH ASSISTANCE STG Manage Bowel with Assistance. Mod I  Outcome: Progressing Goal: RH STG MANAGE BOWEL W/MEDICATION W/ASSISTANCE STG Manage Bowel with Medication with Assistance.  Outcome: Progressing  Problem: RH SKIN INTEGRITY Goal: RH STG SKIN FREE OF INFECTION/BREAKDOWN Min A  Outcome: Progressing Goal: RH STG ABLE TO PERFORM INCISION/WOUND CARE W/ASSISTANCE STG Able To Perform Incision/Wound Care With Assistance.  Outcome: Progressing  Problem: RH SAFETY Goal: RH STG ADHERE TO SAFETY PRECAUTIONS W/ASSISTANCE/DEVICE STG Adhere to Safety Precautions With Assistance/Device. Mod I  Outcome: Progressing  Problem: RH PAIN MANAGEMENT Goal: RH STG PAIN MANAGED AT OR BELOW PT'S PAIN GOAL <5  Outcome: Progressing

## 2014-06-22 NOTE — Progress Notes (Signed)
Subjective: The patient is seen and examined. He is currently at the inpatient rehabilitation. He is feeling better and undergoing physical therapy and occupational therapy. The final pathology from the resected T5 lesion was consistent with plasmacytoma. He denied having any significant nausea or vomiting, no fever or chills. He denied having any significant chest pain, shortness breath, cough or hemoptysis. His back pain has significantly improved.  Objective: Vital signs in last 24 hours: Temp:  [97.6 F (36.4 C)-98.3 F (36.8 C)] 98.3 F (36.8 C) (11/19 0606) Pulse Rate:  [80-83] 80 (11/19 0606) Resp:  [18] 18 (11/19 0606) BP: (121-138)/(48-76) 138/76 mmHg (11/19 0606) SpO2:  [97 %-99 %] 97 % (11/19 0606)  Intake/Output from previous day: 11/18 0701 - 11/19 0700 In: 820 [P.O.:820] Out: 1575 [Urine:1575] Intake/Output this shift:    General appearance: alert, cooperative, fatigued and no distress Resp: clear to auscultation bilaterally Cardio: regular rate and rhythm, S1, S2 normal, no murmur, click, rub or gallop GI: soft, non-tender; bowel sounds normal; no masses,  no organomegaly Extremities: extremities normal, atraumatic, no cyanosis or edema  Lab Results:   Recent Labs  06/21/14 0406  WBC 7.6  HGB 9.6*  HCT 28.5*  PLT 134*   BMET  Recent Labs  06/21/14 0406  NA 138  K 3.7  CL 104  CO2 25  GLUCOSE 106*  BUN 26*  CREATININE 0.89  CALCIUM 8.1*    Studies/Results: No results found.  Medications: I have reviewed the patient's current medications.  Assessment/Plan: 1) T5 compression fracture with cord compression status post laminectomy and the final pathology was consistent with plasmacytoma. The patient will continue with rehabilitation. 2) recently diagnosed multiple myeloma: I had a lengthy discussion with the patient today about his current condition and further investigation to confirm the diagnosis and stage of his disease. I ordered several  studies today including multiple myeloma panel in addition to skeletal bone survey. I will also arrange for the patient to have CT-guided bone marrow biopsy and aspirate by interventional radiology for confirmation of his diagnosis. Once we have the staging workup and studies, I will discuss with the patient has treatment options. I will arrange a follow-up appointment for him after discharge from the hospital for more detailed discussion of his condition and treatment. Thank you for taking good care of Mr. Comas, I will continue to follow up the patient with you and assist in his management on as-needed basis.   LOS: 2 days    Shontavia Mickel K. 06/22/2014

## 2014-06-22 NOTE — Consult Note (Addendum)
Chief Complaint: Multiple Myeloma  Referring Physician(s): Dr Julien Nordmann  History of Present Illness: Jeremiah Owens is a 80 y.o. male  Pt with Hx prostate ca and remote hx melanoma Recent surgery for new Thoracic 5 tumor Pathology of bx; +plasma cell neoplasm with plasmablastic morphology Multiple Myeloma request for IR consult for Bone Marrow biopsy per Oncology   Past Medical History  Diagnosis Date  . Compression fracture   . Prostate cancer   . Melanoma     Past Surgical History  Procedure Laterality Date  . Hernia repair    . Posterior cervical fusion/foraminotomy N/A 06/17/2014    Procedure: Thoracic five laminectomy for epidural tumor resection Thoracic 4-7 posterior lateral arthrodesis, segmental pedicle screw fixation.;  Surgeon: Ashok Pall, MD;  Location: [facility];  Service: Neurosurgery;  Laterality: N/A;    Allergies: Iodine  Medications: Prior to Admission medications   Medication Sig Start Date End Date Taking? Authorizing Provider  celecoxib (CELEBREX) 200 MG capsule Take 200 mg by mouth every other day.    Yes Historical Provider, MD  cholecalciferol (VITAMIN D) 1000 UNITS tablet Take 1,000 Units by mouth at bedtime.    Yes Historical Provider, MD  dexamethasone (DECADRON) 4 MG tablet Take 1.5 tablets (6 mg total) by mouth 4 (four) times daily. 06/17/14  Yes Curt Bears, MD  Glucosamine-Chondroit-Vit C-Mn (GLUCOSAMINE 1500 COMPLEX PO) Take 2 tablets by mouth daily after breakfast.    Yes Historical Provider, MD  HYDROcodone-acetaminophen (NORCO/VICODIN) 5-325 MG per tablet Take 1-2 tablets by mouth every 4 (four) hours as needed for moderate pain.   Yes Historical Provider, MD  HYDROCORTISONE, TOPICAL, 2.5 % SOLN Apply 1 application topically daily as needed.    Yes Historical Provider, MD  levothyroxine (SYNTHROID, LEVOTHROID) 25 MCG tablet Take 25 mcg by mouth daily. 05/06/14  Yes Historical Provider, MD  metroNIDAZOLE (METROCREAM) 0.75 % cream  Apply 1 application topically See admin instructions. Every time pt washes face, he put a small amount on his nose.   Yes Historical Provider, MD  Multiple Vitamins-Minerals (CENTRUM SILVER PO) Take 1 tablet by mouth daily.   Yes Historical Provider, MD  omeprazole (PRILOSEC) 40 MG capsule Take 40 mg by mouth daily. In the afternoon 05/29/14  Yes Historical Provider, MD  Polyvinyl Alcohol-Povidone (REFRESH OP) Apply 1 drop to eye daily as needed (dry/irritated eyes).   Yes Historical Provider, MD  ranitidine (ZANTAC) 150 MG tablet Take 150 mg by mouth at bedtime.  05/23/14  Yes Historical Provider, MD  tamsulosin (FLOMAX) 0.4 MG CAPS capsule Take 0.4 mg by mouth at bedtime.  04/13/14  Yes Historical Provider, MD    No family history on file.  History   Social History  . Marital Status: Married    Spouse Name: N/A    Number of Children: N/A  . Years of Education: N/A   Social History Main Topics  . Smoking status: Never Smoker   . Smokeless tobacco: Not on file  . Alcohol Use: No  . Drug Use: No  . Sexual Activity: Not on file   Other Topics Concern  . Not on file   Social History Narrative    Review of Systems: A 12 point ROS discussed and pertinent positives are indicated in the HPI above.  All other systems are negative.  Review of Systems  Constitutional: Positive for activity change. Negative for appetite change, fatigue and unexpected weight change.  Respiratory: Negative for cough and shortness of breath.   Gastrointestinal: Negative for  abdominal pain.  Genitourinary: Negative for difficulty urinating.  Musculoskeletal: Positive for back pain.  Neurological: Positive for weakness. Negative for dizziness, numbness and headaches.  Psychiatric/Behavioral: Negative for behavioral problems and confusion.     Vital Signs: BP 138/76 mmHg  Pulse 80  Temp(Src) 98.3 F (36.8 C) (Oral)  Resp 18  Wt 77.1 kg (169 lb 15.6 oz)  SpO2 97%  Physical Exam  Constitutional: He  is oriented to person, place, and time.  Cardiovascular: Normal rate and regular rhythm.   Pulmonary/Chest: Effort normal and breath sounds normal. He has no wheezes.  Abdominal: Soft. Bowel sounds are normal. There is no tenderness.  Musculoskeletal: Normal range of motion.  Recent back surgery Uses wheel chair  Neurological: He is alert and oriented to person, place, and time.  Skin: Skin is warm and dry.  Psychiatric: He has a normal mood and affect. His behavior is normal. Judgment and thought content normal.    Imaging: Dg Chest 2 View  06/17/2014   CLINICAL DATA:  Shortness of breath, upper chest pain  EXAM: CHEST  2 VIEW  COMPARISON:  01/02/2014  FINDINGS: Cardiomediastinal silhouette is stable. No acute infiltrate or pleural effusion. No pulmonary edema. Mild basilar atelectasis. Mild degenerative changes thoracic spine.  IMPRESSION: No acute infiltrate or pulmonary edema.  Mild basilar atelectasis.   Electronically Signed   By: Lahoma Crocker M.D.   On: 06/17/2014 18:04   Dg Thoracic Spine 2 View  06/18/2014   CLINICAL DATA:  Thoracic fusion  EXAM: DG C-ARM 61-120 MIN; THORACIC SPINE - 2 VIEW  FLUOROSCOPY TIME:  57 seconds  COMPARISON:  None  FINDINGS: Three level upper posterior thoracic spine fusion. There is thoracic spine spondylosis.  IMPRESSION: Three level upper posterior thoracic spine fusion.   Electronically Signed   By: Kathreen Devoid   On: 06/18/2014 04:10   Ct Angio Chest Pe W/cm &/or Wo Cm  06/17/2014   CLINICAL DATA:  Chest pain shortness of breath concern for pulmonary embolism initial evaluation ; Personal history of malignant melanoma, with metastatic disease currently involving thoracic spine  EXAM: CT ANGIOGRAPHY CHEST WITH CONTRAST  TECHNIQUE: Multidetector CT imaging of the chest was performed using the standard protocol during bolus administration of intravenous contrast. Multiplanar CT image reconstructions and MIPs were obtained to evaluate the vascular anatomy.   CONTRAST:  161m OMNIPAQUE IOHEXOL 350 MG/ML SOLN  COMPARISON:  06/17/2014, 06/08/2014  FINDINGS: Parietal filling defects in the pulmonary arterial system. Thoracic aorta shows no evidence of dissection or dilatation. Numerous small to borderline mediastinal lymph nodes and hilar lymph nodes, nonspecific. No significant pleural or pericardial effusion. Moderate bilateral dependent atelectasis. Known moderate T5 compression deformity with extensive lytic change consistent with metastasis. No other acute musculoskeletal findings.  Partially visualized low-attenuation lesion upper pole right kidney with average attenuation value of 0 most consistent with a partially visualized cyst measuring between 2 and 3 cm.  Review of the MIP images confirms the above findings.  IMPRESSION: No evidence of pulmonary arterial embolism.  Known lytic metastatic lesion T5 vertebral body with compression deformity   Electronically Signed   By: RSkipper ClicheM.D.   On: 06/17/2014 19:16   Dg UDuanne LimerickW/kub  05/23/2014   CLINICAL DATA:  Symptoms of reflux  EXAM: UPPER GI SERIES WITH KUB  TECHNIQUE: After obtaining a scout radiograph a routine upper GI series was performed using thin barium  FLUOROSCOPY TIME:  2 min 42 seconds  COMPARISON:  None.  FINDINGS: A preliminary  film of the abdomen shows no bowel obstruction. There is a lumbar scoliosis convex to the right with degenerative change. No opaque calculi are noted.  A single contrast upper GI was performed. There is some prominence of the cricopharyngeus muscle noted. However, for age peristalsis is within normal limits. There does appear to be a small hiatal hernia. No definite gastroesophageal reflux could be demonstrated however. A pill was given at the end of the study which did pass into the stomach without delay.  The stomach is normal in contour and peristalsis. The duodenal bulb fills and the duodenal loop is in normal position. There does appear to be a proximal jejunal  diverticulum present.  IMPRESSION: 1. Small hiatal hernia. No gastroesophageal reflux could be demonstrated. Prominent cricopharyngeus muscle. Barium pill passes into the stomach without delay. 2. No abnormality of the stomach or duodenum. Proximal jejunal diverticulum. 3. Lumbar scoliosis with diffuse degenerative change in the lumbar spine.   Electronically Signed   By: Ivar Drape M.D.   On: 05/23/2014 10:53   Dg C-arm 61-120 Min  06/18/2014   CLINICAL DATA:  Thoracic fusion  EXAM: DG C-ARM 61-120 MIN; THORACIC SPINE - 2 VIEW  FLUOROSCOPY TIME:  57 seconds  COMPARISON:  None  FINDINGS: Three level upper posterior thoracic spine fusion. There is thoracic spine spondylosis.  IMPRESSION: Three level upper posterior thoracic spine fusion.   Electronically Signed   By: Kathreen Devoid   On: 06/18/2014 04:10    Labs:  CBC:  Recent Labs  06/15/14 1139  06/17/14 1638 06/17/14 2203 06/17/14 2356 06/18/14 0028 06/21/14 0406  WBC 5.1  --  5.8 4.8  --   --  7.6  HGB 12.7*  < > 13.9 13.1 12.2* 11.6* 9.6*  HCT 38.7  < > 40.5 38.1* 36.0* 34.0* 28.5*  PLT 156  --  167 148*  --   --  134*  < > = values in this interval not displayed.  COAGS:  Recent Labs  06/17/14 2203  INR 1.15  APTT 32    BMP:  Recent Labs  06/15/14 1139  06/17/14 1638 06/17/14 2203 06/17/14 2356 06/18/14 0028 06/21/14 0406  NA 134*  < > 139 131* 135* 134* 138  K 4.6  < > 4.8 4.0 4.4 4.4 3.7  CL  --   --  100 96  --   --  104  CO2 28  --  25 25  --   --  25  GLUCOSE 107  < > 113* 122* 176* 199* 106*  BUN 23.9  --  22 18  --   --  26*  CALCIUM 9.3  --  9.8 9.2  --   --  8.1*  CREATININE 1.1  --  1.01 0.84  --   --  0.89  GFRNONAA  --   --  67* 79*  --   --  77*  GFRAA  --   --  78* >90  --   --  90*  < > = values in this interval not displayed.  LIVER FUNCTION TESTS:  Recent Labs  06/15/14 1139 06/17/14 1638 06/21/14 0406  BILITOT 0.43 0.4 <0.2*  AST 25 36 31  ALT '15 23 29  ' ALKPHOS 64 73 47  PROT  8.2 9.5* 6.3  ALBUMIN 3.7 4.0 2.5*    TUMOR MARKERS: No results for input(s): AFPTM, CEA, CA199, CHROMGRNA in the last 8760 hours.  Assessment and Plan:  T5 tumor removal surgery bx reveals  plasma cell neoplasm Multiple myeloma per Dr Julien Nordmann Now scheduled for [facility] bx 11/20 Pt aware of procedure benefits and risks and agreeable to proceed Consent signed andin chart  Thank you for this interesting consult.  I greatly enjoyed meeting Seven Dollens and look forward to participating in their care.    I spent a total of 20 minutes face to face in clinical consultation, greater than 50% of which was counseling/coordinating care for BM bx  Signed: Ayako Tapanes A 06/22/2014, 8:32 AM

## 2014-06-22 NOTE — Progress Notes (Signed)
[facility]     PROGRESS NOTE    Subjective/Complaints: Mild back pain last night. Able to sleep. Feet feel tight. No sob, cp A  review of systems has been performed and if not noted above is otherwise negative.   Objective: Vital Signs: Blood pressure 138/76, pulse 80, temperature 98.3 F (36.8 C), temperature source Oral, resp. rate 18, weight 77.1 kg (169 lb 15.6 oz), SpO2 97 %. No results found.  Recent Labs  06/21/14 0406  WBC 7.6  HGB 9.6*  HCT 28.5*  PLT 134*    Recent Labs  06/21/14 0406  NA 138  K 3.7  CL 104  GLUCOSE 106*  BUN 26*  CREATININE 0.89  CALCIUM 8.1*   CBG (last 3)  No results for input(s): GLUCAP in the last 72 hours.  Wt Readings from Last 3 Encounters:  06/21/14 77.1 kg (169 lb 15.6 oz)  06/17/14 73.3 kg (161 lb 9.6 oz)  06/15/14 74.844 kg (165 lb)    Physical Exam:  HENT: dentition good Head: Normocephalic.  Right Ear: External ear normal.  Left Ear: External ear normal.  Eyes: Conjunctivae and EOM are normal. Pupils are equal, round, and reactive to light. Right eye exhibits no discharge. Left eye exhibits no discharge.  Neck: Normal range of motion. Neck supple. No JVD present. No tracheal deviation present. No thyromegaly present.  Cardiovascular: Normal rate, regular rhythm and normal heart sounds.  Respiratory: Effort normal and breath sounds normal. No respiratory distress. He has no wheezes. He has no rales. He exhibits no tenderness.  GI: Soft. Bowel sounds are normal. He exhibits no distension. There is no tenderness. There is no rebound.  Musculoskeletal:  Upper back/mid back pain  Lymphadenopathy:   He has no cervical adenopathy.  Neurological: He is alert and oriented to person, place, and time.  Follows commands. No cn findings. Left shoulder limited due to pain/ tightness. Otherwise UE 4/5. LE: HF 3/5. KE 3/5, ADF/APF 4/5. Decreased LT/proprioception in both feet. No resting  tone.  Skin: Skin is warm.  Back incision is dressed, clean with honeycomb dressing Psychiatric: He has a normal mood and affect. His behavior is normal. Judgment and thought content normal  Assessment/Plan: 1. 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 Physiatrist is providing close team supervision and 24 hour management of active medical problems listed below. Physiatrist and rehab team continue to assess barriers to discharge/monitor patient progress toward functional and medical goals. FIM: FIM - Bathing Bathing Steps Patient Completed: Right Arm, Chest, Left Arm, Left upper leg, Abdomen, Right upper leg, Front perineal area, Right lower leg (including foot), Left lower leg (including foot), Buttocks Bathing: 4: Steadying assist  FIM - Upper Body Dressing/Undressing Upper body dressing/undressing steps patient completed: Thread/unthread right sleeve of pullover shirt/dresss, Thread/unthread left sleeve of pullover shirt/dress, Put head through opening of pull over shirt/dress, Pull shirt over trunk Upper body dressing/undressing: 5: Supervision: Safety issues/verbal cues FIM - Lower Body Dressing/Undressing Lower body dressing/undressing steps patient completed: Thread/unthread right underwear leg, Thread/unthread left underwear leg, Thread/unthread left pants leg, Thread/unthread right pants leg Lower body dressing/undressing: 2: Max-Patient completed 25-49% of tasks  FIM - Toileting Toileting steps completed by patient: Adjust clothing prior to toileting Toileting Assistive Devices: Grab bar or rail for support Toileting: 2: Max-Patient completed 1 of 3 steps  FIM - Toilet Transfers Toilet   Transfers Assistive Devices: Grab bars, Environmental consultant (3 in 1 over commode) Toilet Transfers: 3-To toilet/BSC: Mod A (lift or lower  assist)  FIM - Control and instrumentation engineer Devices: Arm rests, Copy: 4: Supine > Sit: Min A (steadying Pt. > 75%/lift 1 leg), 3: Bed > Chair or W/C: Mod A (lift or lower assist)  FIM - Locomotion: Wheelchair Distance: 25 Locomotion: Wheelchair: 1: Travels less than 50 ft with minimal assistance (Pt.>75%) FIM - Locomotion: Ambulation Locomotion: Ambulation Assistive Devices: Administrator Ambulation/Gait Assistance: 3: Mod assist Locomotion: Ambulation: 1: Travels less than 50 ft with moderate assistance (Pt: 50 - 74%)  Comprehension Comprehension Mode: Auditory Comprehension: 6-Follows complex conversation/direction: With extra time/assistive device  Expression Expression Mode: Verbal Expression: 7-Expresses complex ideas: With no assist  Social Interaction Social Interaction: 6-Interacts appropriately with others with medication or extra time (anti-anxiety, antidepressant).  Problem Solving Problem Solving: 4-Solves basic 75 - 89% of the time/requires cueing 10 - 24% of the time  Memory Memory: 5-Recognizes or recalls 90% of the time/requires cueing < 10% of the time  Medical Problem List and Plan: 1. Functional deficits secondary to multiple myeloma to thoracic spine status post decompression and fusion T4-7 06/18/2014 2. DVT Prophylaxis/Anticoagulation: SCDs. Check vascular study today 3. Pain Management: Hydrocodone and Robaxin as needed. Monitor with increased mobility  -controlled at present, has positional/situation pain 4. Hypothyroidism. Synthroid 5. Neuropsych: This patient is capable of making decisions on his own behalf. 6. Skin/Wound Care: routine skin checks 7. Fluids/Electrolytes/Nutrition: strict I and O's. Follow-up chemistries. Provide nutritional supplements as needed. 8.BPH/history of prostate cancer. Flomax 0.4 mg daily at bedtime. Emptying bladder without issues 9. Bowels: moving well, probably can back off  senna for now 10. ABLA: labs reviewed. Encourage po intake 11. Onc: path pos for MM. For BM bx 11/20  LOS (Days) 2 A FACE TO FACE EVALUATION WAS PERFORMED  Shannon Balthazar T 06/22/2014 10:43 AM

## 2014-06-22 NOTE — Progress Notes (Signed)
Physical Therapy Session Note  Patient Details  Name: Jeremiah Owens MRN: [id number] Date of Birth: [birth year]  Today's Date: 06/22/2014 PT Individual Time: 1300-1400 PT Individual Time Calculation (min): 60 min   Short Term Goals: Week 1:  PT Short Term Goal 1 (Week 1): Pt will demonstrate sit to side lie transfer in bed req min A PT Short Term Goal 2 (Week 1): Pt will demonstrate stand step transfers with min A PT Short Term Goal 3 (Week 1): Pt will ambulate 73' with RW req min A PT Short Term Goal 4 (Week 1): Pt will ascend/descend 1 step with RW and min A PT Short Term Goal 5 (Week 1): Pt will increase Berg Balance Scale by 6 points to at least 10/56, indicating MCID  Skilled Therapeutic Interventions/Progress Updates:    Gait Training: PT instructs pt in ambulation with RW req mod A for balance and verbal cues for technique: straighten your knees, keep your feet apart, take smaller steps, move the walker forward. Pt demonstrates s/s consistent with severe proprioceptive deficits in B LEs and PT instructs pt to use cervical flexion to compensate for deficits by using vision for feedback. Pt ambulates 5' + 15' + 15' with focus on safety - very slow gait speed.   Neuromuscular Reeducation: PT instructs pt in repeated sit to stand with RW from w/c with focus on safe sequencing and balance x 10 reps req min A. After a few reps, PT notes that pt is unable to achieve static standing balance with RW without min A, and so PT repositions w/c to face mirror for the rest of sit to stands and with visual feedback, pt is able to achieve close SBA static standing balance with RW. Focus is on correct sequencing: scooting to the edge of the chair with both arms on armrests, tucking feet underneath him, hinging forward at the hips to get nose over toes, and one hand on armrest and one hand on RW to stand safely.   W/C Management: PT instructs pt in w/c propulsion with B UEs x 200' with focus on circle  method and coasting for energy conservation - pt begins to get the idea and improves with this technique req SBA and verbal cues.   Pt may benefit from added ankle weights to his LEs during gait to improve proprioception/safety. Pt was very fatigued at beginning of PT session, today, and so additional weight was not placed on his legs during ambulation. Pt's balance and compensation for proprioceptive deficits improved immensely with the addition of visual feedback using a mirror. Continue per PT POC.   Therapy Documentation Precautions:  Precautions Precautions: Fall, Back Precaution Booklet Issued: No Precaution Comments: Reviewed back precautions Restrictions Weight Bearing Restrictions: No Vital Signs: Therapy Vitals Pulse Rate: 91 BP: (!) 121/57 mmHg Patient Position (if appropriate): Sitting Oxygen Therapy SpO2: 100 % O2 Device: Not Delivered Pain: Pain Assessment Pain Assessment: 0-10 Pain Score: 7  Pain Type: Acute pain Pain Location: Back Pain Orientation: Mid Pain Descriptors / Indicators: Aching Pain Onset: On-going Pain Intervention(s): Repositioned;Rest Multiple Pain Sites: No  See FIM for current functional status  Therapy/Group: Individual Therapy  Kiya Eno M 06/22/2014, 1:06 PM

## 2014-06-23 ENCOUNTER — Inpatient Hospital Stay (HOSPITAL_COMMUNITY): Payer: Medicare Other | Admitting: Physical Therapy

## 2014-06-23 ENCOUNTER — Inpatient Hospital Stay (HOSPITAL_COMMUNITY): Payer: Medicare Other

## 2014-06-23 ENCOUNTER — Inpatient Hospital Stay (HOSPITAL_COMMUNITY): Payer: Medicare Other | Admitting: Occupational Therapy

## 2014-06-23 DIAGNOSIS — D4989 Neoplasm of unspecified behavior of other specified sites: Secondary | ICD-10-CM | POA: Diagnosis not present

## 2014-06-23 DIAGNOSIS — D649 Anemia, unspecified: Secondary | ICD-10-CM | POA: Diagnosis not present

## 2014-06-23 DIAGNOSIS — D492 Neoplasm of unspecified behavior of bone, soft tissue, and skin: Secondary | ICD-10-CM

## 2014-06-23 LAB — IGG, IGA, IGM
IGM, SERUM: 35 mg/dL — AB (ref 41–251)
IgA: 54 mg/dL — ABNORMAL LOW (ref 68–379)
IgG (Immunoglobin G), Serum: 2100 mg/dL — ABNORMAL HIGH (ref 650–1600)

## 2014-06-23 LAB — CBC
HCT: 28.4 % — ABNORMAL LOW (ref 39.0–52.0)
Hemoglobin: 9.9 g/dL — ABNORMAL LOW (ref 13.0–17.0)
MCH: 32.5 pg (ref 26.0–34.0)
MCHC: 34.9 g/dL (ref 30.0–36.0)
MCV: 93.1 fL (ref 78.0–100.0)
Platelets: 176 10*3/uL (ref 150–400)
RBC: 3.05 MIL/uL — AB (ref 4.22–5.81)
RDW: 13.5 % (ref 11.5–15.5)
WBC: 5.1 10*3/uL (ref 4.0–10.5)

## 2014-06-23 LAB — BONE MARROW EXAM

## 2014-06-23 LAB — KAPPA/LAMBDA LIGHT CHAINS
KAPPA FREE LGHT CHN: 9.76 mg/dL — AB (ref 0.33–1.94)
KAPPA, LAMDA LIGHT CHAIN RATIO: 17.12 — AB (ref 0.26–1.65)
Lambda free light chains: 0.57 mg/dL (ref 0.57–2.63)

## 2014-06-23 MED ORDER — MIDAZOLAM HCL 2 MG/2ML IJ SOLN
INTRAMUSCULAR | Status: AC | PRN
  Administered 2014-06-23: 0.5 mg via INTRAVENOUS

## 2014-06-23 MED ORDER — FENTANYL CITRATE 0.05 MG/ML IJ SOLN
INTRAMUSCULAR | Status: AC | PRN
  Administered 2014-06-23: 25 ug via INTRAVENOUS

## 2014-06-23 MED ORDER — FENTANYL CITRATE 0.05 MG/ML IJ SOLN
INTRAMUSCULAR | Status: AC
  Filled 2014-06-23: qty 2

## 2014-06-23 MED ORDER — MIDAZOLAM HCL 2 MG/2ML IJ SOLN
INTRAMUSCULAR | Status: AC
Start: 2014-06-23 — End: 2014-06-23
  Filled 2014-06-23: qty 2

## 2014-06-23 MED ORDER — LIDOCAINE HCL 1 % IJ SOLN
INTRAMUSCULAR | Status: AC
  Filled 2014-06-23: qty 20

## 2014-06-23 NOTE — Plan of Care (Signed)
Problem: RH SAFETY Goal: RH STG ADHERE TO SAFETY PRECAUTIONS W/ASSISTANCE/DEVICE STG Adhere to Safety Precautions With Assistance/Device. Mod I  Outcome: Progressing

## 2014-06-23 NOTE — Sedation Documentation (Signed)
Patient denies pain and is resting comfortably.  

## 2014-06-23 NOTE — Procedures (Signed)
Procedure:  CT guided bone marrow biopsy Findings:  Right iliac aspirate and core biopsy performed No complications

## 2014-06-23 NOTE — Progress Notes (Signed)
Occupational Therapy Session Note  Patient Details  Name: Jeremiah Owens MRN: [id number] Date of Birth: Mar 30, 1930  Today's Date: 06/23/2014 OT Individual Time: 0729-0820 OT Individual Time Calculation (min): 51 min   Short Term Goals: Week 1:  OT Short Term Goal 1 (Week 1): Pt will complete toilet transfer at min assist level OT Short Term Goal 2 (Week 1): Pt will complete toilet task with min assist and min cues for adherence to precuations OT Short Term Goal 3 (Week 1): Pt will complete LB dressing with mod assist and use of AE OT Short Term Goal 4 (Week 1): Pt will complete self-care task in standing for 1 min with min assist for balance  Skilled Therapeutic Interventions/Progress Updates: ADL-retraining with focus on improved standing tolerance, AE skill training, and general endurance.   Pt received supine in bed, receptive for treatment although aware of planned imaging appointment.    Pt agreed to bathe at sink, remaining in gown for appointment (appt).   Pt completed bed mobility and transfer to w/c with overall mod assist, observing back precautions with min vc to inhibit bending to reach below his knees.    Pt performed upper body bathing, washing his hair with shampoo cap, standing to wash peri-area and buttocks with steadying assist, and cleaned his ears seated before being alerted to escort for imaging appt.   OT provided expedient assist with donning TEDs and socks, with mod assist transfer back to bed as session terminated early for transport to appt.     Therapy Documentation Precautions:  Precautions Precautions: Fall, Back Precaution Booklet Issued: No Precaution Comments: Reviewed back precautions Restrictions Weight Bearing Restrictions: No Therapy Vitals Temp: 97.9 F (36.6 C) Temp Source: Oral Pulse Rate: 63 Resp: 18 BP: 116/60 mmHg Patient Position (if appropriate): Lying Oxygen Therapy SpO2: 100 % O2 Device: Not Delivered Pulse Oximetry Type:  Continuous SpO2 Alarm Limit Low: (!) 2 End Tidal CO2 (EtCO2): 36   Pain: Pain Assessment Pain Assessment: No/denies pain Pain Score: 0-No pain Pain Type: Acute pain Pain Location: Back Pain Orientation: Upper;Mid Pain Descriptors / Indicators: Aching Pain Frequency: Intermittent Pain Onset: On-going Pain Intervention(s): Medication (See eMAR);Repositioned;Emotional support  See FIM for current functional status  Therapy/Group: Individual Therapy  [place] 06/23/2014, 9:49 AM   Second session: Time: 9528-4132 Time Calculation (min):  9 min (makeup session)  Pain Assessment: No/denies pain  Skilled Therapeutic Interventions: ADL-retraining with focus on toilet transfer, toileting, dynamic standing balance.   Pt received seated in recliner and requesting assist to remove IV and don shirt.   RN contacted and IV was removed during session.   Pt donned shirt and then requested assist with toileting prior to physical therapy session.   OT provided setup with BSC in pt's room and facilitated stand-pivot transfer (mod A) and clothing management.   Pt required steadying assist to complete clothing management and min assist with tactile facilitation for hand placement to sit on BSC.   Pt voided urine and remained on toilet as physical therapist arrived.   See FIM for current functional status  Therapy/Group: Individual Therapy

## 2014-06-23 NOTE — Progress Notes (Signed)
PHYSICAL MEDICINE & REHABILITATION     PROGRESS NOTE    Subjective/Complaints: Awaiting bone marrow bx this morning.  A  review of systems has been performed and if not noted above is otherwise negative.   Objective: Vital Signs: Blood pressure 124/59, pulse 72, temperature 98.3 F (36.8 C), temperature source Oral, resp. rate 17, weight 77.1 kg (169 lb 15.6 oz), SpO2 99 %. Dg Bone Survey Met  06/22/2014   CLINICAL DATA:  Melanoma.  Prostate cancer.  EXAM: METASTATIC BONE SURVEY  COMPARISON:  None.  FINDINGS: No lytic bone lesions identified within the calvarium. There are no suspicious lytic lesions identified involving the upper extremities. There are no suspicious lytic lesions involving the cervical spine. There is no plain film abnormality within the lower cervical spine to correspond with the known MRI demonstrated bone metastasis. T5 compression fracture is again noted. The known tumor within the T5 vertebra is much better seen on the MRI from 06/08/2014. The MRI proven lesion involving the T10 and T12 vertebra are not well seen on today's exam. No lesions identified within the lumbar spine. There are no aggressive lytic lesions identified within the visualize bony pelvis or lower extremities.  IMPRESSION: 1. No lytic lesions identified. The MRI and CT identified spinal lesions are poorly identified on plain film radiographs. 2. T5 compression fracture status post posterior fixation.   Electronically Signed   By: Kerby Moors M.D.   On: 06/22/2014 12:31    Recent Labs  06/21/14 0406  WBC 7.6  HGB 9.6*  HCT 28.5*  PLT 134*    Recent Labs  06/21/14 0406  NA 138  K 3.7  CL 104  GLUCOSE 106*  BUN 26*  CREATININE 0.89  CALCIUM 8.1*   CBG (last 3)  No results for input(s): GLUCAP in the last 72 hours.  Wt Readings from Last 3 Encounters:  06/21/14 77.1 kg (169 lb 15.6 oz)  06/17/14 73.3 kg (161 lb 9.6 oz)  06/15/14 74.844 kg (165 lb)    Physical Exam:   HENT: dentition good Head: Normocephalic.  Right Ear: External ear normal.  Left Ear: External ear normal.  Eyes: Conjunctivae and EOM are normal. Pupils are equal, round, and reactive to light. Right eye exhibits no discharge. Left eye exhibits no discharge.  Neck: Normal range of motion. Neck supple. No JVD present. No tracheal deviation present. No thyromegaly present.  Cardiovascular: Normal rate, regular rhythm and normal heart sounds.  Respiratory: Effort normal and breath sounds normal. No respiratory distress. He has no wheezes. He has no rales. He exhibits no tenderness.  GI: Soft. Bowel sounds are normal. He exhibits no distension. There is no tenderness. There is no rebound.  Musculoskeletal:  Upper back/mid back pain. Left leg a little sore Lymphadenopathy:   He has no cervical adenopathy.  Neurological: He is alert and oriented to person, place, and time.  Follows commands. No cn findings. Left shoulder limited due to pain/ tightness. Otherwise UE 4/5. LE: HF 3/5. KE 3/5, ADF/APF 4/5. Decreased LT/proprioception in both feet. No resting tone.  Skin: Skin is warm.  Back incision is dressed, clean with honeycomb dressing Psychiatric: He has a normal mood and affect. His behavior is normal. Judgment and thought content normal  Assessment/Plan: 1. 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 Physiatrist is providing close team supervision and 24 hour management of  active medical problems listed below. Physiatrist and rehab team continue to assess barriers to discharge/monitor patient progress toward functional and medical goals. FIM: FIM - Bathing Bathing Steps Patient Completed: Right Arm, Chest, Left Arm, Left upper leg, Abdomen, Right upper leg, Front perineal area, Right lower leg (including foot), Left lower leg  (including foot), Buttocks Bathing: 4: Steadying assist  FIM - Upper Body Dressing/Undressing Upper body dressing/undressing steps patient completed: Thread/unthread right sleeve of pullover shirt/dresss, Thread/unthread left sleeve of pullover shirt/dress, Put head through opening of pull over shirt/dress, Pull shirt over trunk Upper body dressing/undressing: 5: Supervision: Safety issues/verbal cues FIM - Lower Body Dressing/Undressing Lower body dressing/undressing steps patient completed: Thread/unthread right underwear leg, Thread/unthread left underwear leg, Thread/unthread left pants leg, Thread/unthread right pants leg Lower body dressing/undressing: 2: Max-Patient completed 25-49% of tasks  FIM - Toileting Toileting steps completed by patient: Performs perineal hygiene Toileting Assistive Devices: Grab bar or rail for support Toileting: 2: Max-Patient completed 1 of 3 steps  FIM - Radio producer Devices: Grab bars, Environmental consultant (3 in 1 over commode) Toilet Transfers: 3-To toilet/BSC: Mod A (lift or lower assist)  FIM - Control and instrumentation engineer Devices: Environmental consultant, Arm rests Bed/Chair Transfer: 4: Bed > Chair or W/C: Min A (steadying Pt. > 75%), 4: Chair or W/C > Bed: Min A (steadying Pt. > 75%)  FIM - Locomotion: Wheelchair Distance: 200 Locomotion: Wheelchair: 5: Travels 150 ft or more: maneuvers on rugs and over door sills with supervision, cueing or coaxing FIM - Locomotion: Ambulation Locomotion: Ambulation Assistive Devices: Administrator Ambulation/Gait Assistance: 3: Mod assist Locomotion: Ambulation: 1: Travels less than 50 ft with moderate assistance (Pt: 50 - 74%)  Comprehension Comprehension Mode: Auditory Comprehension: 6-Follows complex conversation/direction: With extra time/assistive device  Expression Expression Mode: Verbal Expression: 6-Expresses complex ideas: With extra time/assistive device  Social  Interaction Social Interaction: 6-Interacts appropriately with others with medication or extra time (anti-anxiety, antidepressant).  Problem Solving Problem Solving: 5-Solves basic 90% of the time/requires cueing < 10% of the time  Memory Memory: 5-Recognizes or recalls 90% of the time/requires cueing < 10% of the time  Medical Problem List and Plan: 1. Functional deficits secondary to multiple myeloma to thoracic spine status post decompression and fusion T4-7 06/18/2014 2. DVT Prophylaxis/Anticoagulation: SCDs. Check vascular study today 3. Pain Management: Hydrocodone and Robaxin as needed. Monitor with increased mobility  -controlled at present, has positional/situation pain 4. Hypothyroidism. Synthroid 5. Neuropsych: This patient is capable of making decisions on his own behalf. 6. Skin/Wound Care: routine skin checks 7. Fluids/Electrolytes/Nutrition: strict I and O's. Follow-up chemistries. Provide nutritional supplements as needed. 8.BPH/history of prostate cancer. Flomax 0.4 mg daily at bedtime. Emptying bladder without issues 9. Bowels: moving well, probably can back off senna for now 10. ABLA: labs reviewed. Encourage po intake 11. Onc: path + for MM. For BM bx today.  LOS (Days) 3 A FACE TO FACE EVALUATION WAS PERFORMED  Xiara Knisley T 06/23/2014 7:34 AM

## 2014-06-23 NOTE — Progress Notes (Signed)
Occupational Therapy Note  Patient Details  Name: Jeremiah Owens MRN: [id number] Date of Birth: 06-Oct-1929  Patient missed 60 minute scheduled OT secondary to down for BM biopsy then on bedrest until ~1030am.  [place], [place] 06/23/2014, 9:30 AM

## 2014-06-23 NOTE — IPOC Note (Signed)
Overall Plan of Care [facility]) Patient Details Name: Jeremiah Owens MRN: [id number] DOB: 1930/02/22  Admitting Diagnosis: t5 cord compression sp fusion  Hospital Problems: Active Problems:   Neoplasm of thoracic spine   Paraplegia   Postoperative anemia due to acute blood loss   Multiple myeloma     Functional Problem List: Nursing Pain, Safety, Skin Integrity  PT Balance, Sensory, Skin Integrity, Edema, Endurance, Motor, Pain  OT Balance, Perception, Safety, Sensory, Endurance, Motor  SLP    TR         Basic ADL's: OT Grooming, Bathing, Dressing, Toileting     Advanced  ADL's: OT Simple Meal Preparation     Transfers: PT Bed Mobility, Bed to Chair, Car, Manufacturing systems engineer, Metallurgist: PT Ambulation, Emergency planning/management officer     Additional Impairments: OT Fuctional Use of Upper Extremity (LUE limited by osteoarthritis)  SLP        TR      Anticipated Outcomes Item Anticipated Outcome  Self Feeding n/a  Swallowing      Basic self-care  supervision-Mod I  Toileting  supervision    Bathroom Transfers Mod I  Bowel/Bladder  Mod I  Transfers  Mod I  Locomotion  Mod I  Communication     Cognition     Pain  <4  Safety/Judgment  Supervision   Therapy Plan: PT Intensity: Minimum of 1-2 x/day ,45 to 90 minutes PT Frequency: 5 out of 7 days PT Duration Estimated Length of Stay: 10-14 days OT Intensity: Minimum of 1-2 x/day, 45 to 90 minutes OT Frequency: 5 out of 7 days OT Duration/Estimated Length of Stay: 10-14 days         Team Interventions: Nursing Interventions Patient/Family Education, Disease Management/Prevention, Pain Management, Skin Care/Wound Management  PT interventions Ambulation/gait training, Community reintegration, Engineer, drilling, Neuromuscular re-education, Psychosocial support, Stair training, UE/LE Strength taining/ROM, Wheelchair propulsion/positioning, Training and development officer, Discharge  planning, Pain management, Skin care/wound management, Therapeutic Activities, UE/LE Coordination activities, Disease management/prevention, Functional mobility training, Patient/family education, Splinting/orthotics, Therapeutic Exercise  OT Interventions Training and development officer, Discharge planning, Community reintegration, Engineer, drilling, Functional mobility training, Neuromuscular re-education, Psychosocial support, Patient/family education, Self Care/advanced ADL retraining, Therapeutic Exercise, Therapeutic Activities, UE/LE Strength taining/ROM, UE/LE Coordination activities, Wheelchair propulsion/positioning  SLP Interventions    TR Interventions    SW/CM Interventions Discharge Planning, Barrister's clerk, Patient/Family Education    Team Discharge Planning: Destination: PT-Home ,OT- Home , SLP-  Projected Follow-up: PT-Outpatient PT, Home health PT, OT-  Home health OT, SLP-  Projected Equipment Needs: PT-To be determined, OT- To be determined, SLP-  Equipment Details: PT-Pt owns RW and 4WW, OT-  Patient/family involved in discharge planning: PT- Patient,  OT-Patient, Family member/caregiver, SLP-   MD ELOS: 10-14 days Medical Rehab Prognosis:  Excellent Assessment: The patient has been admitted for CIR therapies with the diagnosis of paraplegia due to multiple myeloma in the spine. The team will be addressing functional mobility, strength, stamina, balance, safety, adaptive techniques and equipment, self-care, bowel and bladder mgt, patient and caregiver education, NMR, pain mgt, pacing, wound care, ego support, leisure awareness, community reintegration. Goals have been set at supervision to mod I with basic self-care and ADL's and mod I with locomotion and transfers.Meredith Staggers, MD, FAAPMR      See Team Conference Notes for weekly updates to the plan of care

## 2014-06-23 NOTE — Care Management Note (Signed)
Inpatient [facility] Individual Statement of Services  Patient Name:  Jeremiah Owens  Date:  06/23/2014  Welcome to the [facility].  Our goal is to provide you with an individualized program based on your diagnosis and situation, designed to meet your specific needs.  With this comprehensive rehabilitation program, you will be expected to participate in at least 3 hours of rehabilitation therapies Monday-Friday, with modified therapy programming on the weekends.  Your rehabilitation program will include the following services:  Physical Therapy (PT), Occupational Therapy (OT), 24 hour per day rehabilitation nursing, Therapeutic Recreaction (TR), Case Management (Social Worker), Rehabilitation Medicine, Nutrition Services and Pharmacy Services  Weekly team conferences will be held on Tuesdays to discuss your progress.  Your Social Worker will talk with you frequently to get your input and to update you on team discussions.  Team conferences with you and your family in attendance may also be held.  Expected length of stay: 10-14 days  Overall anticipated outcome: modified independent  Depending on your progress and recovery, your program may change. Your Social Worker will coordinate services and will keep you informed of any changes. Your Social Worker's name and contact numbers are listed  below.  The following services may also be recommended but are not provided by the [facility] will be made to provide these services after discharge if needed.  Arrangements include referral to agencies that provide these services.  Your insurance has been verified to be:  Medicare and Tricare Your primary doctor is:  Dr. Lysle Rubens  Pertinent information will be shared with your doctor and your insurance company.  Social Worker:  [place],  [place] or ([phone]   Information discussed with and copy given to patient by: Lennart Pall, 06/23/2014, 11:47 AM

## 2014-06-23 NOTE — Progress Notes (Signed)
Physical Therapy Session Note  Patient Details  Name: Jeremiah Owens MRN: [id number] Date of Birth: 1929-12-08  Today's Date: 06/23/2014 PT Individual Time: 1505-1605 PT Individual Time Calculation (min): 60 min   Short Term Goals: Week 1:  PT Short Term Goal 1 (Week 1): Pt will demonstrate sit to side lie transfer in bed req min A PT Short Term Goal 2 (Week 1): Pt will demonstrate stand step transfers with min A PT Short Term Goal 3 (Week 1): Pt will ambulate 72' with RW req min A PT Short Term Goal 4 (Week 1): Pt will ascend/descend 1 step with RW and min A PT Short Term Goal 5 (Week 1): Pt will increase Berg Balance Scale by 6 points to at least 10/56, indicating MCID  Skilled Therapeutic Interventions/Progress Updates:    Gait Training: PT instructs pt in ambulation with RW with visual feedback x 20' + 25' req mod A for balance due to impaired proprioception, decreased balance, and ultimately knees buckling, which limited pt's distance.   Therapeutic Activity: PT instructs pt in transfer off of toilet without AD but with armrests req min A - PT steadies pt in stand and he pulls his pants up, then stand steps to the recliner.   Therapeutic Exercise: PT instructs pt in standing strengthening exercises to B LE in // bars: squats, heel raises, standing hip abduction: 2 x 10 reps to B LEs.   Pt's transfers are showing improvement, but gait continues to be significantly impaired. Pt continues to be motivated to work very hard, but fatigues easily. Continue per PT POC.   Therapy Documentation Precautions:  Precautions Precautions: Fall, Back Precaution Booklet Issued: No Precaution Comments: Reviewed back precautions Restrictions Weight Bearing Restrictions: No Pain: Pain Assessment Pain Assessment: 0-10 Pain Score: 2  Pain Type: Surgical pain Pain Location: Back Pain Orientation: Mid Pain Descriptors / Indicators: Aching Pain Onset: On-going Pain Intervention(s):  Rest Multiple Pain Sites: No  See FIM for current functional status  Therapy/Group: Individual Therapy  Eliah Marquard M 06/23/2014, 3:13 PM

## 2014-06-24 ENCOUNTER — Encounter (HOSPITAL_COMMUNITY): Payer: Medicare Other | Admitting: Occupational Therapy

## 2014-06-24 ENCOUNTER — Inpatient Hospital Stay (HOSPITAL_COMMUNITY): Payer: Medicare Other | Admitting: Physical Therapy

## 2014-06-24 ENCOUNTER — Inpatient Hospital Stay (HOSPITAL_COMMUNITY): Payer: Medicare Other | Admitting: Occupational Therapy

## 2014-06-24 NOTE — Progress Notes (Signed)
Physical Therapy Session Note  Patient Details  Name: Jeremiah Owens MRN: [id number] Date of Birth: Sep 17, 1929  Today's Date: 06/24/2014 PT Individual Time: 1405-1505 PT Individual Time Calculation (min): 60 min   Short Term Goals: Week 1:  PT Short Term Goal 1 (Week 1): Pt will demonstrate sit to side lie transfer in bed req min A PT Short Term Goal 2 (Week 1): Pt will demonstrate stand step transfers with min A PT Short Term Goal 3 (Week 1): Pt will ambulate 85' with RW req min A PT Short Term Goal 4 (Week 1): Pt will ascend/descend 1 step with RW and min A PT Short Term Goal 5 (Week 1): Pt will increase Berg Balance Scale by 6 points to at least 10/56, indicating MCID  Skilled Therapeutic Interventions/Progress Updates:    Gait Training: PT instructs pt in ambulation with RW and mirror feedback x 35' req min-mod A, focus is on weight shifting R and L to unweight the swing leg and on straightening knees in stance as pt fatigues and reverts to a crouched gait.  PT instructs pt in ascending/descending one 6" step with RW req mod A: backwards ascent leading with R LE and forwards descent leaning with L LE x 3 reps, req significant rest break in between each one.   Neuromuscular Reeducation: PT administers TUG x 3 reps and pt scores: 89 seconds, 70 seconds, and 54 seconds. PT provides min-mod A for balance when pt is completing this test with a RW.   Pt's ability to sit to stand safely is improving, as is his ambulation distance and level of assist needed. PT was able to initiate stair training during today's session and pt agrees to have wife measure height of 1 step to enter through garage and heights of 2 steps to enter through porch on back of house (they are lower than the garage step). PT explained the basic concept of neuroplasticity to pt and he verbalized understanding. Continue per PT POC.    Therapy Documentation Precautions:  Precautions Precautions: Fall, Back Precaution  Booklet Issued: No Precaution Comments: Reviewed back precautions Restrictions Weight Bearing Restrictions: No Pain: Pain Assessment Pain Assessment: 0-10 Pain Score: 1  Pain Type: Surgical pain Pain Location: Back Pain Orientation: Upper Pain Descriptors / Indicators: Aching Pain Frequency: Intermittent Pain Onset: With Activity Patients Stated Pain Goal: 2 Pain Intervention(s): Rest Multiple Pain Sites: No  Balance: Balance Balance Assessed: Yes Standardized Balance Assessment Standardized Balance Assessment: Timed Up and Go Test Timed Up and Go Test TUG: Normal TUG Normal TUG (seconds): 54  See FIM for current functional status  Therapy/Group: Individual Therapy  Bassel Gaskill M 06/24/2014, 2:22 PM

## 2014-06-24 NOTE — Progress Notes (Signed)
  [facility] PHYSICAL MEDICINE & REHABILITATION     PROGRESS NOTE    Subjective/Complaints: Patient feels well. He has no real complaints. He is enjoying therapy but wishes that improvements would be quicker. He had a bone marrow biopsy yesterday.   Objective: Vital Signs: Blood pressure 133/89, pulse 76, temperature 98.6 F (37 C), temperature source Oral, resp. rate 17, weight 169 lb 15.6 oz (77.1 kg), SpO2 97 %.  Physical Exam:    Elderly male sitting in a wheelchair eating breakfast. Chest is clear to auscultation. Cardiac exam S1 and S2 are regular. Abdominal exam active bowel sounds, soft.  Assessment/Plan: 1.  T5 compression fracture with cord compression status post laminectomy and the final pathology was consistent with plasmacytoma.T5 compression fracture with cord compression status post laminectomy and the final pathology was consistent with plasmacytoma. Medical Problem List and Plan: 1. Functional deficits secondary to multiple myeloma to thoracic spine status post decompression and fusion T4-7 06/18/2014 2. DVT Prophylaxis/Anticoagulation: SCDs. Check vascular study today 3. Pain Management: Pain has been adequately controlled. 4. Hypothyroidism. Synthroid 5. Neuropsych: This patient is capable of making decisions on his own behalf. 6. Skin/Wound Care: routine skin checks 7. Fluids/Electrolytes/Nutrition: strict I and O's.  Basic Metabolic Panel:    Component Value Date/Time   NA 138 06/21/2014 0406   NA 134* 06/15/2014 1139   K 3.7 06/21/2014 0406   K 4.6 06/15/2014 1139   CL 104 06/21/2014 0406   CO2 25 06/21/2014 0406   CO2 28 06/15/2014 1139   BUN 26* 06/21/2014 0406   BUN 23.9 06/15/2014 1139   CREATININE 0.89 06/21/2014 0406   CREATININE 1.1 06/15/2014 1139   GLUCOSE 106* 06/21/2014 0406   GLUCOSE 107 06/15/2014 1139   CALCIUM 8.1* 06/21/2014 0406   CALCIUM 9.3 06/15/2014 1139    8.BPH/history of prostate cancer. Flomax 0.4 mg daily at bedtime.  Emptying bladder without issues 9. Bowels: moving well, probably can back off senna for now 10. ABLA: labs reviewed. Encourage po intake 11. Onc: path + for MM. For BM bx today.  LOS (Days) 4 A FACE TO FACE EVALUATION WAS PERFORMED  SWORDS,BRUCE HENRY 06/24/2014 8:30 AM

## 2014-06-24 NOTE — Progress Notes (Signed)
Social Work  Social Work Assessment and Plan  Patient Details  Name: Jeremiah Owens MRN: [id number] Date of Birth: 1930-02-02  Today's Date: 06/23/2014  Problem List:  Patient Active Problem List   Diagnosis Date Noted  . Multiple myeloma   . Paraplegia 06/21/2014  . Postoperative anemia due to acute blood loss 06/21/2014  . 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 Spine metastasis 06/17/2014  . Thoracic spine tumor 06/17/2014  . Metastatic bone cancer 06/15/2014   Past Medical History:  Past Medical History  Diagnosis Date  . Compression fracture   . Prostate cancer   . Melanoma    Past Surgical History:  Past Surgical History  Procedure Laterality Date  . Hernia repair    . Posterior cervical fusion/foraminotomy N/A 06/17/2014    Procedure: Thoracic five laminectomy for epidural tumor resection Thoracic 4-7 posterior lateral arthrodesis, segmental pedicle screw fixation.;  Surgeon: Ashok Pall, MD;  Location: [place];  Service: Neurosurgery;  Laterality: N/A;   Social History:  reports that he has never smoked. He does not have any smokeless tobacco history on file. He reports that he does not drink alcohol or use illicit drugs.  Family / Support Systems Marital Status: Married How Long?: 48 yrs Patient Roles: Spouse, Parent Spouse/Significant Other: wife, Drakkar Medeiros @ [phone] or [phone] Children: they have one son, Karem Tomaso, living in [place] @ [phone] Other Supports: wife's sister, Henrene Hawking @ H) [phone] Anticipated Caregiver: wife Ability/Limitations of Caregiver: wife 56 years old Caregiver Availability: 24/7 Family Dynamics: wife reports that she is in "very good health" and fully intends to provide pt any assistance needed.  Very attentive.  Social History Preferred language: English Religion:  Cultural Background: NA Education: college grad  Read: Yes Write: Yes Employment Status: Retired Date  Retired/Disabled/Unemployed: "years ago" - originally retired from the [employer] after 30 yrs and then had "various jobs" Freight forwarder Issues: none Guardian/Conservator: None - per MD, pt capable of making decisions on his own behalf   Abuse/Neglect Physical Abuse: Denies Verbal Abuse: Denies Sexual Abuse: Denies Exploitation of patient/patient's resources: Denies Self-Neglect: Denies  Emotional Status Pt's affect, behavior adn adjustment status: Pt very pleasant and talkative even despits having bone biopsy with sedation earlier in the day.  He describes himself as "highly motivated" and eager to "get on with it".  Denies any emotional distress. Realistic about probable ongoing treatments.  Will monitor and refer to neuropsych if appropriate. 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  As noted, he is very eager to return to this as well. Anticipated changes in roles/activities/participation: Goals set for mod independent overall, which would indicate little change in the home.  Pt may be limited in community level activties. Pt/family expectations/goals: "I just want to do what I need to do and get back."  [employer]: None Premorbid Home Care/DME Agencies: None Transportation available at discharge: yes  Discharge Planning Living Arrangements: Spouse/significant other Support Systems: Spouse/significant other, Children, Other relatives, Water engineer, Social worker community Type of  Residence: Private residence Insurance Resources: [employer], Multimedia programmer (specify) Sports administrator) Financial Resources: Social  Security Financial Screen Referred: No Living Expenses: Own Money Management: Spouse Does the patient have any problems obtaining your medications?: No Home Management: wife primarily  Patient/Family Preliminary Plans: pt to return home with his wife who can provide physical assistance if needed Social Work Anticipated Follow Up Needs: HH/OP, Support Group Expected length of stay: ELOS 10 to 15 days  Clinical Impression Very pleasant, oriented, elderly gentleman here following spinal surgery/ tumor removal.  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  Wife very supportive and capable of assisting.  No noted emotional distress, however, will monitor.  Adalaide Jaskolski 06/23/2014, 2:41 PM

## 2014-06-24 NOTE — Progress Notes (Signed)
Occupational Therapy Session Note  Patient Details  Name: Jeremiah Owens MRN: [id number] Date of Birth: 06-01-1930  Today's Date: 06/24/2014 OT Individual Time: 1030-1130 OT Individual Time Calculation (min): 60 min    Short Term Goals: Week 1:  OT Short Term Goal 1 (Week 1): Pt will complete toilet transfer at min assist level OT Short Term Goal 2 (Week 1): Pt will complete toilet task with min assist and min cues for adherence to precuations OT Short Term Goal 3 (Week 1): Pt will complete LB dressing with mod assist and use of AE OT Short Term Goal 4 (Week 1): Pt will complete self-care task in standing for 1 min with min assist for balance  Skilled Therapeutic Interventions/Progress Updates:    1:1 self care retraining at shower level. Checked in with nursing about covering incisions.  All incisions were covered throughly.  Pt on toilet when arrived. Pt min A for sit to stand and steady A for toileting but did require A for thoroughness with hygiene. Transferred into w/c with RW with min A with cues for trunk support and control. Pt transitioned into shower stall with RW with min A. Pt bathed using long handled sponge for LB. Pt required A for sit to stand and standing balance for trunk control.  Continued practice with AE for LB dressing. Sit to standing varied from min to mod A depending on A needed for weight shifts and awareness of feet placement and balance.   2nd session: 13:00-14:00 1:1 Focus on functional mobility including transfer training with RW and functional ambulation with RW. Pt required min to mod A with tactile cues for trunk/ hip control and verbal and visual cues for feet placement. Sat EOM with focus on upright posture and strengthening  back with maintaining posture.  With mat elevated so feet are not touching the group coordination feet activity drawing letters in the air while maintaining core strength. Pt with some calf tightness on left - stretching through AROM.  Left  in gym in prep for next therapist. Pain 2/10 in session with rest breaks given as needed.   Therapy Documentation Precautions:  Precautions Precautions: Fall, Back Precaution Booklet Issued: No Precaution Comments: Reviewed back precautions Restrictions Weight Bearing Restrictions: No Pain:  reports burning sensation along incision - especially with activity - RN aware and gave meds at end of 1st session .  2nd session 2/10 pain - relief with rest  See FIM for current functional status  Therapy/Group: Individual Therapy  Willeen Cass [facility] 06/24/2014, 11:13 AM

## 2014-06-24 NOTE — Plan of Care (Signed)
Problem: RH BOWEL ELIMINATION Goal: RH STG MANAGE BOWEL WITH ASSISTANCE STG Manage Bowel with Assistance. Mod I  Outcome: Progressing Goal: RH STG MANAGE BOWEL W/MEDICATION W/ASSISTANCE STG Manage Bowel with Medication with Assistance.  Outcome: Progressing  Problem: RH SKIN INTEGRITY Goal: RH STG SKIN FREE OF INFECTION/BREAKDOWN Min A  Outcome: Progressing Goal: RH STG ABLE TO PERFORM INCISION/WOUND CARE W/ASSISTANCE STG Able To Perform Incision/Wound Care With Assistance.  Outcome: Progressing  Problem: RH SAFETY Goal: RH STG ADHERE TO SAFETY PRECAUTIONS W/ASSISTANCE/DEVICE STG Adhere to Safety Precautions With Assistance/Device. Mod I  Outcome: Progressing  Problem: RH PAIN MANAGEMENT Goal: RH STG PAIN MANAGED AT OR BELOW PT'S PAIN GOAL <5  Outcome: Progressing

## 2014-06-25 ENCOUNTER — Inpatient Hospital Stay (HOSPITAL_COMMUNITY): Payer: Medicare Other

## 2014-06-25 NOTE — Progress Notes (Signed)
   PHYSICAL MEDICINE & REHABILITATION     PROGRESS NOTE    Subjective/Complaints: Patient feels well although he has continued leg weakness. His bowels have started to move. His appetite is good. He is sitting up in his wheelchair.   Objective: Vital Signs: Blood pressure 121/54, pulse 80, temperature 98 F (36.7 C), temperature source Oral, resp. rate 17, weight 169 lb 15.6 oz (77.1 kg), SpO2 92 %.  Physical Exam:    Elderly male sitting up eating breakfast. Chest is clear to auscultation. Cardiac exam S1 and S2 are regular. Abdominal exam active bowel sounds, soft. Extremities no edema. There is some dried blood on the dressing at his thoracic spine.  Assessment/Plan: 1.  T5 compression fracture with cord compression status post laminectomy and the final pathology was consistent with plasmacytoma.T5 compression fracture with cord compression status post laminectomy and the final pathology was consistent with plasmacytoma. Medical Problem List and Plan: 1. Functional deficits secondary to multiple myeloma and plasmacytoma to  thoracic spine status post decompression and fusion T4-7 06/18/2014 2. DVT Prophylaxis/Anticoagulation: SCDs.  3. Pain Management: Pain has been adequately controlled. 4. Hypothyroidism. Synthroid 5. Neuropsych: This patient is capable of making decisions on his own behalf. 6. Skin/Wound Care: routine skin checks 7. Fluids/Electrolytes/Nutrition:  Basic Metabolic Panel:    Component Value Date/Time   NA 138 06/21/2014 0406   NA 134* 06/15/2014 1139   K 3.7 06/21/2014 0406   K 4.6 06/15/2014 1139   CL 104 06/21/2014 0406   CO2 25 06/21/2014 0406   CO2 28 06/15/2014 1139   BUN 26* 06/21/2014 0406   BUN 23.9 06/15/2014 1139   CREATININE 0.89 06/21/2014 0406   CREATININE 1.1 06/15/2014 1139   GLUCOSE 106* 06/21/2014 0406   GLUCOSE 107 06/15/2014 1139   CALCIUM 8.1* 06/21/2014 0406   CALCIUM 9.3 06/15/2014 1139    8.BPH/history of  prostate cancer. Flomax 0.4 mg daily at bedtime. Emptying bladder without issues 9. Bowels: moving well,  10. ABLA: labs reviewed. Encourage po intake 11. Onc: path + for MM. For BM bx today.  LOS (Days) 5 A FACE TO FACE EVALUATION WAS PERFORMED  [facility] 06/25/2014 7:56 AM

## 2014-06-25 NOTE — Plan of Care (Signed)
Problem: RH BOWEL ELIMINATION Goal: RH STG MANAGE BOWEL WITH ASSISTANCE STG Manage Bowel with Assistance. Mod I  Outcome: Progressing     

## 2014-06-25 NOTE — Plan of Care (Signed)
Problem: RH SAFETY Goal: RH STG ADHERE TO SAFETY PRECAUTIONS W/ASSISTANCE/DEVICE STG Adhere to Safety Precautions With Assistance/Device. Mod I  Outcome: Progressing

## 2014-06-25 NOTE — Plan of Care (Signed)
Problem: RH PAIN MANAGEMENT Goal: RH STG PAIN MANAGED AT OR BELOW PT'S PAIN GOAL <5  Outcome: Progressing

## 2014-06-25 NOTE — Progress Notes (Signed)
Old honeycomb dressing fell off when patient was changing clothes. Replaced with new dressing at 2035. Will continue to monitor.

## 2014-06-25 NOTE — Plan of Care (Signed)
Problem: RH SKIN INTEGRITY Goal: RH STG SKIN FREE OF INFECTION/BREAKDOWN Min A  Outcome: Progressing

## 2014-06-26 ENCOUNTER — Inpatient Hospital Stay (HOSPITAL_COMMUNITY): Payer: Medicare Other

## 2014-06-26 NOTE — Plan of Care (Signed)
Problem: RH BOWEL ELIMINATION Goal: RH STG MANAGE BOWEL WITH ASSISTANCE STG Manage Bowel with Assistance. Mod I  Outcome: Progressing Goal: RH STG MANAGE BOWEL W/MEDICATION W/ASSISTANCE STG Manage Bowel with Medication with Assistance.  Outcome: Progressing  Problem: RH SKIN INTEGRITY Goal: RH STG SKIN FREE OF INFECTION/BREAKDOWN Min A  Outcome: Progressing Goal: RH STG ABLE TO PERFORM INCISION/WOUND CARE W/ASSISTANCE STG Able To Perform Incision/Wound Care With Assistance.  Outcome: Progressing  Problem: RH SAFETY Goal: RH STG ADHERE TO SAFETY PRECAUTIONS W/ASSISTANCE/DEVICE STG Adhere to Safety Precautions With Assistance/Device. Mod I  Outcome: Progressing  Problem: RH PAIN MANAGEMENT Goal: RH STG PAIN MANAGED AT OR BELOW PT'S PAIN GOAL <5  Outcome: Progressing

## 2014-06-26 NOTE — Progress Notes (Signed)
Physical Therapy Session Note  Patient Details  Name: Jeremiah Owens MRN: [id number] Date of Birth: 1929-08-06  Today's Date: 06/26/2014 PT Individual Time: 1000-1100 PT Individual Time Calculation (min): 60 min   Short Term Goals: Week 1:  PT Short Term Goal 1 (Week 1): Pt will demonstrate sit to side lie transfer in bed req min A PT Short Term Goal 2 (Week 1): Pt will demonstrate stand step transfers with min A PT Short Term Goal 3 (Week 1): Pt will ambulate 17' with RW req min A PT Short Term Goal 4 (Week 1): Pt will ascend/descend 1 step with RW and min A PT Short Term Goal 5 (Week 1): Pt will increase Berg Balance Scale by 6 points to at least 10/56, indicating MCID  Skilled Therapeutic Interventions/Progress Updates:    Pt received seated in w/c, agreeable to participate in therapy. Pt propelled w/c 100' to rehab gym w/ alternating BLE for coordination/HS strengthening. During gym session pt w/ 3# ankle weights on bilateral LE for increased proprioceptive feedback. SPT w/ MinA and RW. Blocked practice sit<>stands from mat table x10 w/ Min-CGA. Worked on foot taps on 4" aerobic step w/ emphasis on slow controlled movement. Supine <> sit w/ MinA, rolled L/R w/ MinA on mat table. x10 SAQ and SLR w/ 3# ankle weights, x10 sidelying clams for improved bed mobility. Min cues for sequencing/hand placement during bed mobility to maintain back precautions. Ambulated 36' w/ RW and ModA from therapist providing assist at quad during stance phase bilaterally, mod VC's for step length, 1 LOB where pt pushed RW too far ahead of himself requiring MaxA to correct, +2 for close w/c follow for safety. SPT w/ RW and MinA w/c>recliner. Pt left seated in recliner w/ all needs within reach.   Therapy Documentation Precautions:  Precautions Precautions: Fall, Back Precaution Booklet Issued: No Precaution Comments: Reviewed back precautions Restrictions Weight Bearing Restrictions: No General:   Vital  Signs: Therapy Vitals Temp: 98.2 F (36.8 C) Temp Source: Oral Pulse Rate: 71 Resp: 17 BP: (!) 115/55 mmHg Patient Position (if appropriate): Lying Oxygen Therapy SpO2: 97 % O2 Device: Not Delivered Pain:   Intermittent pain at incision/upper back Locomotion : Ambulation Ambulation/Gait Assistance: 3: Mod assist   See FIM for current functional status  Therapy/Group: Individual Therapy  Rada Hay  Rada Hay, PT, DPT 06/26/2014, 7:40 AM

## 2014-06-26 NOTE — Progress Notes (Signed)
Occupational Therapy Session Note  Patient Details  Name: Jeremiah Owens MRN: [id number] Date of Birth: Jan 23, 1930  Today's Date: 06/26/2014 OT Individual Time: 8676-1950 OT Individual Time Calculation (min): 60 min   Short Term Goals: Week 1:  OT Short Term Goal 1 (Week 1): Pt will complete toilet transfer at min assist level OT Short Term Goal 2 (Week 1): Pt will complete toilet task with min assist and min cues for adherence to precuations OT Short Term Goal 3 (Week 1): Pt will complete LB dressing with mod assist and use of AE OT Short Term Goal 4 (Week 1): Pt will complete self-care task in standing for 1 min with min assist for balance  Skilled Therapeutic Interventions/Progress Updates: ADL-retraining with focus on improved transfers, dynamic standing balance, pt ed on adapted bathing/dressing skills (lateral leans), and effective use of AE to maintain back precautions.   OT discussed pt's chronic postural deficits (kyphosis) relating to performance with B & D and pt's ability to maintain back precautions with PA.   Per PA, as kyphosis is not correctable, pt should be encouraged to maintain back precautions within his limits as much as possible.   Pt received seated in recliner.   With steadying assist, pt rose to RW and completed SPT to w/c.   Pt was escorted to doorway of bathroom and performed functional mobility to tub bench using RW and 1 lb ankle weights provided as trial to improve proprioception during mobility.   Pt completed transfer to bench with min vc for technique and steadying assist for safety.   Pt bathed using LH sponge and was educated on lateral leans to cleanse buttocks.    Pt returned to w/c after bath and dressed in w/c uisng bed rail to maintain static standing while assisted with pulling up underwear and pants.   OT provided setup to don TEDs.   Pt left in w/c at end of session, grooming at sink with call light within reach.    Pt currently requires setup assist to  recover reacher during transitional tasks throughout ADL to maintain back precautions.   Therapy Documentation Precautions:  Precautions Precautions: Fall, Back Precaution Booklet Issued: No Precaution Comments: Reviewed back precautions Restrictions Weight Bearing Restrictions: No  Pain: Pain Assessment Pain Assessment: 0-10 Pain Score: 2  Pain Type: Surgical pain Pain Location: Back Pain Orientation: Upper Pain Descriptors / Indicators: Aching Pain Frequency: Intermittent Pain Onset: Gradual Patients Stated Pain Goal: 2 Pain Intervention(s): Medication (See eMAR);Repositioned Multiple Pain Sites: No  See FIM for current functional status  Therapy/Group: Individual Therapy   Second session: Time: 1300-1400 Time Calculation (min):  60 min  Pain Assessment: No/denies pain  Skilled Therapeutic Interventions: Therapeutic activities with focus on core strengthening to improve performance with bed mobility and transfers.   Pt reports inability to lift his legs up to get in bed and requested continued instruction on methods to facilitate improved trunk lateral flexion to improve lift.   Pt performed 6 sets of 10 reps lateral leg lifts progressions from full side lying to upper trunk supported to sitting at edge of bed, left side first then on right side.   Pt was able to lift his legs all the way onto raised mat unassisted 3 times while right side lying and 1 on left.  Pt  required 4 rest breaks during session but demo'd excellent attention and perseverance to task despite mild discomfort at left shoulder.   OT provided instruction on use of leg lifter however device  appeared to add unnecessary complexity to task.   Pt returned to his room and attempted transfer and bed mobility on hospital bed however he required min assist to bring left leg up (pt = 95%) due to foam mattress representing a more compliant surface vs raised mat.   See FIM for current functional  status  Therapy/Group: Individual Therapy  Tomoko Sandra 06/26/2014, 10:53 AM

## 2014-06-26 NOTE — Plan of Care (Signed)
Problem: RH SKIN INTEGRITY Goal: RH STG SKIN FREE OF INFECTION/BREAKDOWN Min A  Outcome: Progressing Goal: RH STG ABLE TO PERFORM INCISION/WOUND CARE W/ASSISTANCE STG Able To Perform Incision/Wound Care With Assistance.  Outcome: Progressing  Problem: RH SAFETY Goal: RH STG ADHERE TO SAFETY PRECAUTIONS W/ASSISTANCE/DEVICE STG Adhere to Safety Precautions With Assistance/Device. Mod I  Outcome: Progressing

## 2014-06-26 NOTE — Progress Notes (Signed)
[place] PHYSICAL MEDICINE & REHABILITATION     PROGRESS NOTE    Subjective/Complaints: Had his best night of sleep yet. Some back pain. Still frustrated that he can't walk A  review of systems has been performed and if not noted above is otherwise negative.   Objective: Vital Signs: Blood pressure 115/55, pulse 71, temperature 98.2 F (36.8 C), temperature source Oral, resp. rate 17, weight 77.1 kg (169 lb 15.6 oz), SpO2 97 %. No results found. No results for input(s): WBC, HGB, HCT, PLT in the last 72 hours. No results for input(s): NA, K, CL, GLUCOSE, BUN, CREATININE, CALCIUM in the last 72 hours.  Invalid input(s): CO CBG (last 3)  No results for input(s): GLUCAP in the last 72 hours.  Wt Readings from Last 3 Encounters:  06/21/14 77.1 kg (169 lb 15.6 oz)  06/17/14 73.3 kg (161 lb 9.6 oz)  06/15/14 74.844 kg (165 lb)    Physical Exam:  HENT: dentition good Head: Normocephalic.  Right Ear: External ear normal.  Left Ear: External ear normal.  Eyes: Conjunctivae and EOM are normal. Pupils are equal, round, and reactive to light. Right eye exhibits no discharge. Left eye exhibits no discharge.  Neck: Normal range of motion. Neck supple. No JVD present. No tracheal deviation present. No thyromegaly present.  Cardiovascular: Normal rate, regular rhythm and normal heart sounds.  Respiratory: Effort normal and breath sounds normal. No respiratory distress. He has no wheezes. He has no rales. He exhibits no tenderness.  GI: Soft. Bowel sounds are normal. He exhibits no distension. There is no tenderness. There is no rebound.  Musculoskeletal:  Upper back/mid back pain. Left leg a little sore Lymphadenopathy:   He has no cervical adenopathy.  Neurological: He is alert and oriented to person, place, and time.  Follows commands. No cn findings. Left shoulder limited due to pain/ tightness. Otherwise UE 4/5. LE: HF 3/5. KE 3/5, ADF/APF 4/5. Decreased LT/proprioception in  both feet. No resting tone.  Skin: Skin is warm.  Back incision is dressed, clean, dry with new honeycomb dressing Psychiatric: He has a normal mood and affect. His behavior is normal. Judgment and thought content normal  Assessment/Plan: 1. 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 Physiatrist is providing close team supervision and 24 hour management of active medical problems listed below. Physiatrist and rehab team continue to assess barriers to discharge/monitor patient progress toward functional and medical goals. FIM: FIM - Bathing Bathing Steps Patient Completed: Chest, Right Arm, Left Arm, Abdomen, Front perineal area, Buttocks, Right upper leg, Left upper leg, Left lower leg (including foot) Bathing: 4: Min-Patient completes 8-9 [id number] parts or 75+ percent  FIM - Upper Body Dressing/Undressing Upper body dressing/undressing steps patient completed: Thread/unthread left sleeve of pullover shirt/dress, Put head through opening of pull over shirt/dress, Pull shirt over trunk Upper body dressing/undressing: 5: Set-up assist to: Obtain clothing/put away FIM - Lower Body Dressing/Undressing Lower body dressing/undressing steps patient completed: Thread/unthread right underwear leg, Thread/unthread left underwear leg, Thread/unthread right pants leg Lower body dressing/undressing: 3: Mod-Patient completed 50-74% of tasks  FIM - Toileting Toileting steps completed by patient: Adjust clothing prior to toileting, Performs perineal hygiene Toileting Assistive Devices: Grab bar or rail for support Toileting: 3: Mod-Patient completed 2 of 3 steps  FIM - TRadio producerDevices: Grab bars, WInsurance account managerTransfers: 4-To toilet/BSC: Min  A (steadying Pt. > 75%), 4-From toilet/BSC: Min A (steadying Pt. >  75%)  FIM - Bed/Chair Transfer Bed/Chair Transfer Assistive Devices: Walker, Arm rests Bed/Chair Transfer: 4: Sit > Supine: Min A (steadying pt. > 75%/lift 1 leg), 4: Bed > Chair or W/C: Min A (steadying Pt. > 75%), 4: Supine > Sit: Min A (steadying Pt. > 75%/lift 1 leg), 4: Chair or W/C > Bed: Min A (steadying Pt. > 75%)  FIM - Locomotion: Wheelchair Distance: 150 Locomotion: Wheelchair: 5: Travels 150 ft or more: maneuvers on rugs and over door sills with supervision, cueing or coaxing FIM - Locomotion: Ambulation Locomotion: Ambulation Assistive Devices: Administrator Ambulation/Gait Assistance: 3: Mod assist Locomotion: Ambulation: 1: Travels less than 50 ft with moderate assistance (Pt: 50 - 74%)  Comprehension Comprehension Mode: Auditory Comprehension: 6-Follows complex conversation/direction: With extra time/assistive device  Expression Expression Mode: Verbal Expression: 6-Expresses complex ideas: With extra time/assistive device  Social Interaction Social Interaction: 6-Interacts appropriately with others with medication or extra time (anti-anxiety, antidepressant).  Problem Solving Problem Solving: 5-Solves complex 90% of the time/cues < 10% of the time  Memory Memory: 6-More than reasonable amt of time  Medical Problem List and Plan: 1. Functional deficits secondary to multiple myeloma to thoracic spine status post decompression and fusion T4-7 06/18/2014 2. DVT Prophylaxis/Anticoagulation: SCDs. Check vascular study today 3. Pain Management: Hydrocodone and Robaxin as needed. Monitor with increased mobility  -controlled at present, has positional/situation pain 4. Hypothyroidism. Synthroid 5. Neuropsych: This patient is capable of making decisions on his own behalf. 6. Skin/Wound Care: routine skin checks 7. Fluids/Electrolytes/Nutrition: strict I and O's. Follow-up chemistries. Provide nutritional supplements as needed. 8.BPH/history of prostate cancer.  Flomax 0.4 mg daily at bedtime. Emptying bladder without issues 9. Bowels: emptied over weekend 10. ABLA: labs reviewed. Encourage po intake 11. Onc: path + for MM.  BM bx friday.  LOS (Days) 6 A FACE TO FACE EVALUATION WAS PERFORMED  Demeisha Geraghty T 06/26/2014 7:35 AM

## 2014-06-27 ENCOUNTER — Inpatient Hospital Stay (HOSPITAL_COMMUNITY): Payer: Medicare Other

## 2014-06-27 NOTE — Progress Notes (Addendum)
[place] PHYSICAL MEDICINE & REHABILITATION     PROGRESS NOTE    Subjective/Complaints: Had another good night. Pain better controlled. A  review of systems has been performed and if not noted above is otherwise negative.   Objective: Vital Signs: Blood pressure 121/94, pulse 75, temperature 97.8 F (36.6 C), temperature source Oral, resp. rate 17, weight 77.1 kg (169 lb 15.6 oz), SpO2 99 %. No results found. No results for input(s): WBC, HGB, HCT, PLT in the last 72 hours. No results for input(s): NA, K, CL, GLUCOSE, BUN, CREATININE, CALCIUM in the last 72 hours.  Invalid input(s): CO CBG (last 3)  No results for input(s): GLUCAP in the last 72 hours.  Wt Readings from Last 3 Encounters:  06/21/14 77.1 kg (169 lb 15.6 oz)  06/17/14 73.3 kg (161 lb 9.6 oz)  06/15/14 74.844 kg (165 lb)    Physical Exam:  HENT: dentition good Head: Normocephalic.  Right Ear: External ear normal.  Left Ear: External ear normal.  Eyes: Conjunctivae and EOM are normal. Pupils are equal, round, and reactive to light. Right eye exhibits no discharge. Left eye exhibits no discharge.  Neck: Normal range of motion. Neck supple. No JVD present. No tracheal deviation present. No thyromegaly present.  Cardiovascular: Normal rate, regular rhythm and normal heart sounds.  Respiratory: Effort normal and breath sounds normal. No respiratory distress. He has no wheezes. He has no rales. He exhibits no tenderness.  GI: Soft. Bowel sounds are normal. He exhibits no distension. There is no tenderness. There is no rebound.  Musculoskeletal:  Upper back/mid back pain. Left leg a little sore Lymphadenopathy:   He has no cervical adenopathy.  Neurological: He is alert and oriented to person, place, and time.  Follows commands. No cn findings. Left shoulder limited due to pain/ tightness. Otherwise UE 4/5. LE: HF 3/5. KE 3/5, ADF/APF 4/5. Decreased LT/proprioception in both feet. No resting tone.   Skin: Skin is warm.  Back incision is clean Psychiatric: He has a normal mood and affect. His behavior is normal. Judgment and thought content normal  Assessment/Plan: 1. 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 Physiatrist is providing close team supervision and 24 hour management of active medical problems listed below. Physiatrist and rehab team continue to assess barriers to discharge/monitor patient progress toward functional and medical goals. FIM: FIM - Bathing Bathing Steps Patient Completed: Chest, Right Arm, Left Arm, Abdomen, Front perineal area, Buttocks, Right upper leg, Left upper leg, Right lower leg (including foot), Left lower leg (including foot) (using LH for feet; lateral leans for buttocks) Bathing: 5: Supervision: Safety issues/verbal cues  FIM - Upper Body Dressing/Undressing Upper body dressing/undressing steps patient completed: Thread/unthread right sleeve of pullover shirt/dresss, Thread/unthread left sleeve of pullover shirt/dress, Put head through opening of pull over shirt/dress, Pull shirt over trunk Upper body dressing/undressing: 5: Set-up assist to: Obtain clothing/put away FIM - Lower Body Dressing/Undressing Lower body dressing/undressing steps patient completed: Thread/unthread right underwear leg, Thread/unthread left underwear leg, Thread/unthread right pants leg, Thread/unthread left pants leg Lower body dressing/undressing: 3: Mod-Patient completed 50-74% of tasks  FIM - Toileting Toileting steps completed by patient: Adjust clothing prior to toileting, Performs perineal hygiene, Adjust clothing after toileting Toileting Assistive Devices: Grab bar or rail for support Toileting: 4: Steadying assist  FIM - Radio producer Devices: Grab bars, Insurance account manager  Transfers: 4-To toilet/BSC: Min A (steadying Pt. > 75%), 4-From toilet/BSC: Min A (steadying Pt. > 75%)  FIM - Bed/Chair Transfer Bed/Chair Transfer Assistive Devices: Walker, Arm rests Bed/Chair Transfer: 4: Sit > Supine: Min A (steadying pt. > 75%/lift 1 leg), 4: Bed > Chair or W/C: Min A (steadying Pt. > 75%), 4: Supine > Sit: Min A (steadying Pt. > 75%/lift 1 leg), 4: Chair or W/C > Bed: Min A (steadying Pt. > 75%)  FIM - Locomotion: Wheelchair Distance: 150 Locomotion: Wheelchair: 2: Travels 50 - 149 ft with supervision, cueing or coaxing FIM - Locomotion: Ambulation Locomotion: Ambulation Assistive Devices: Administrator Ambulation/Gait Assistance: 3: Mod assist Locomotion: Ambulation: 1: Travels less than 50 ft with moderate assistance (Pt: 50 - 74%)  Comprehension Comprehension Mode: Auditory Comprehension: 6-Follows complex conversation/direction: With extra time/assistive device (hearing aids)  Expression Expression Mode: Verbal Expression: 7-Expresses complex ideas: With no assist  Social Interaction Social Interaction: 7-Interacts appropriately with others - No medications needed.  Problem Solving Problem Solving: 5-Solves basic 90% of the time/requires cueing < 10% of the time  Memory Memory: 5-Requires cues to use assistive device  Medical Problem List and Plan: 1. Functional deficits secondary to multiple myeloma to thoracic spine status post decompression and fusion T4-7 06/18/2014  -sutures out soon 2. DVT Prophylaxis/Anticoagulation: SCDs. 3. Pain Management: Hydrocodone and Robaxin as needed. Monitor with increased mobility  -controlled at present, has positional/situation pain--discussed posture/ROM 4. Hypothyroidism. Synthroid 5. Neuropsych: This patient is capable of making decisions on his own behalf. 6. Skin/Wound Care: routine skin checks 7. Fluids/Electrolytes/Nutrition: strict I and O's. Follow-up chemistries. Provide nutritional supplements as  needed. 8.BPH/history of prostate cancer. Flomax 0.4 mg daily at bedtime. Emptying bladder without issues 9. Bowels: emptied over weekend 10. ABLA: labs reviewed. Encourage po intake 11. Onc: path + for MM.      LOS (Days) 7 A FACE TO FACE EVALUATION WAS PERFORMED  Jaelani Posa T 06/27/2014 7:31 AM

## 2014-06-27 NOTE — Progress Notes (Signed)
Occupational Therapy Session Note  Patient Details  Name: Jeremiah Owens MRN: [id number] Date of Birth: 14-Apr-1930  Today's Date: 06/27/2014 OT Individual Time: 0830-1000 OT Individual Time Calculation (min): 90 min   Short Term Goals: Week 1:  OT Short Term Goal 1 (Week 1): Pt will complete toilet transfer at min assist level OT Short Term Goal 2 (Week 1): Pt will complete toilet task with min assist and min cues for adherence to precuations OT Short Term Goal 3 (Week 1): Pt will complete LB dressing with mod assist and use of AE OT Short Term Goal 4 (Week 1): Pt will complete self-care task in standing for 1 min with min assist for balance  Skilled Therapeutic Interventions/Progress Updates: ADL-retraining with focus on improved functional mobility, transfers, and dynamic standing balance (60 min).  Pt received seated in w/c with feet supported on chair to reduce perceived edema at BLE.    Pt reported good sleep and nutrition with awareness of prolonged session planned (90 min).   Pt presented self-assessment of his progress consistent with performance witnessed by therapist with primary deficits relating to gait, standing balance, LE weakness and residual pain.    Pt engaged in mobility to bathroom doorway and ambulated to toilet and to tub bench with min guard assist.  Pt performed bath sitting and standing and required min assist only to check thoroughness of cleansing buttocks (minimal residual feces removed during check). Pt returned to w/c to dress using reacher for lower body with improved efficiency this session.   Pt stood to pull up underwear and pants requiring only steadying assist while standing d/t mild instability.    Therapeutic exercise (15 min) using NuStep.  Pt completed 12 min of therex, level 4, maintaining rate of 30-40 rpm throughout activity and rating exertion as 11 (fair) after task.   Therapeutic activity (15 min)  With focus on improved standing balance (supported  standing only this session, using left hand while performing task with right).   Pt completed 1 game of GoBan (winning game) without evidence of LOB although unable to attempt task unsupported.   Pt returned to his room at end of session in w/c with all needs within reach.    Therapy Documentation Precautions:  Precautions Precautions: Fall, Back Precaution Booklet Issued: No Precaution Comments: Reviewed back precautions Restrictions Weight Bearing Restrictions: No  Pain: Pain Assessment Pain Assessment: 0-10 Pain Score: 8  Pain Type: Surgical pain Pain Location: Back Pain Orientation: Upper;Mid Pain Descriptors / Indicators: Aching Pain Onset: With Activity Pain Intervention(s): Medication (See eMAR)  Exercises: Cardiovascular Exercises NuStep: 12 min, level 4, BORG 12, 30-40 rpm  See FIM for current functional status  Therapy/Group: Individual Therapy  Graison Leinberger 06/27/2014, 12:43 PM

## 2014-06-27 NOTE — Progress Notes (Signed)
Occupational Therapy Session Note  Patient Details  Name: Jeremiah Owens MRN: [id number] Date of Birth: [birth year]  Today's Date: 06/27/2014 OT Individual Time: 1345-1445 OT Individual Time Calculation (min): 60 min    Short Term Goals: Week 1:  OT Short Term Goal 1 (Week 1): Pt will complete toilet transfer at min assist level OT Short Term Goal 2 (Week 1): Pt will complete toilet task with min assist and min cues for adherence to precuations OT Short Term Goal 3 (Week 1): Pt will complete LB dressing with mod assist and use of AE OT Short Term Goal 4 (Week 1): Pt will complete self-care task in standing for 1 min with min assist for balance  Skilled Therapeutic Interventions/Progress Updates:    Pt seen for 1:1 OT session with focus on bed mobility, sit<>stand, functional transfers, and postural control. Pt received supine in bed ready for therapy session. Completed lateral scoot pivot transfer bed>w/c at supervision level. Completed bed mobility in ADL apartment, initially requiring min assist for sit>supine then progressed to supervision with repetition. Pt recalled log rolling technique without cues. Engaged in standing balance/tolerance activity in kitchen with min assist for balance while reaching into overhead cabinets. Engaged in core strengthening activities sitting EOM without BLE supported and frequent rest breaks. Completed sit<>stand 6x with min assist and min cues for hand placement. Pt returned to room and left sitting in recliner chair with all needs in reach.   Therapy Documentation Precautions:  Precautions Precautions: Fall, Back Precaution Booklet Issued: No Precaution Comments: Reviewed back precautions Restrictions Weight Bearing Restrictions: No General:   Vital Signs:  Pain: Pain Assessment Pain Assessment: 0-10 Pain Score: 5  Pain Type: Surgical pain Pain Location: Back Pain Orientation: Upper;Mid Pain Descriptors / Indicators: Aching Pain Onset: With  Activity Pain Intervention(s): Medication (See eMAR)  See FIM for current functional status  Therapy/Group: Individual Therapy  Duayne Cal 06/27/2014, 2:49 PM

## 2014-06-27 NOTE — Plan of Care (Signed)
Problem: RH BOWEL ELIMINATION Goal: RH STG MANAGE BOWEL WITH ASSISTANCE STG Manage Bowel with Assistance. Mod I  Outcome: Progressing Goal: RH STG MANAGE BOWEL W/MEDICATION W/ASSISTANCE STG Manage Bowel with Medication with Assistance.  Outcome: Progressing  Problem: RH SKIN INTEGRITY Goal: RH STG SKIN FREE OF INFECTION/BREAKDOWN Min A  Outcome: Progressing Goal: RH STG ABLE TO PERFORM INCISION/WOUND CARE W/ASSISTANCE STG Able To Perform Incision/Wound Care With Assistance.  Outcome: Progressing  Problem: RH SAFETY Goal: RH STG ADHERE TO SAFETY PRECAUTIONS W/ASSISTANCE/DEVICE STG Adhere to Safety Precautions With Assistance/Device. Mod I  Outcome: Progressing  Problem: RH PAIN MANAGEMENT Goal: RH STG PAIN MANAGED AT OR BELOW PT'S PAIN GOAL <5  Outcome: Progressing

## 2014-06-27 NOTE — Plan of Care (Signed)
Problem: RH BOWEL ELIMINATION Goal: RH STG MANAGE BOWEL WITH ASSISTANCE STG Manage Bowel with Assistance. Mod I  Outcome: Progressing  Problem: RH SKIN INTEGRITY Goal: RH STG SKIN FREE OF INFECTION/BREAKDOWN Min A  Outcome: Progressing Goal: RH STG ABLE TO PERFORM INCISION/WOUND CARE W/ASSISTANCE STG Able To Perform Incision/Wound Care With Assistance.  Outcome: Progressing  Problem: RH SAFETY Goal: RH STG ADHERE TO SAFETY PRECAUTIONS W/ASSISTANCE/DEVICE STG Adhere to Safety Precautions With Assistance/Device. Mod I  Outcome: Progressing  Problem: RH PAIN MANAGEMENT Goal: RH STG PAIN MANAGED AT OR BELOW PT'S PAIN GOAL <5  Outcome: Progressing

## 2014-06-27 NOTE — Progress Notes (Signed)
Physical Therapy Session Note  Patient Details  Name: Real Cona MRN: [id number] Date of Birth: 12-26-1929  Today's Date: 06/27/2014 PT Individual Time: 1000-1030 PT Individual Time Calculation (min): 30 min   Session 2 Time: 1100-1200 Time Calculation (min): 60 min  Short Term Goals: Week 1:  PT Short Term Goal 1 (Week 1): Pt will demonstrate sit to side lie transfer in bed req min A PT Short Term Goal 2 (Week 1): Pt will demonstrate stand step transfers with min A PT Short Term Goal 3 (Week 1): Pt will ambulate 59' with RW req min A PT Short Term Goal 4 (Week 1): Pt will ascend/descend 1 step with RW and min A PT Short Term Goal 5 (Week 1): Pt will increase Berg Balance Scale by 6 points to at least 10/56, indicating MCID  Skilled Therapeutic Interventions/Progress Updates:    Session 1: Pt received seated in w/c, agreeable to participate in therapy. Pt propelled w/c 100' to rehab gym w/ supervision w/ BUE/BLE. SPT w/ RW w/c>mat table w/ MinA for safety, RW management, min cueing for foot placement. Pt rolled basketball w/ R foot then L foot on top 20 x2, with 3# ankle weights on feet for second bout. Noted improved motor control w/ weights increasing proprioceptive feedback. In standing pt completed 3x6 heel/toe raises to increase use of ankle strategies during standing balance. Ambulated 10' to wheelchair with no ankle weights on, MinA for safety and RW management. Noted decreased ataxia vs yesterday. Pt propelled w/c back to room w/ supervision. Left seated in w/c w/ all needs within reach.   Session 2: Pt received seated in w/c, agreeable to participate in therapy.  Pt ambulated 50' during session w/ RW and MinA for safety. Pt's gait characterized by mild ataxia w/ bilateral stepping, increased knee flexion/buckling during stance phase bilaterally, though improved vs yesterday. Pt completed Kinetron Cybex exercises for 2x5' on moderate resistance for increased sensory feedback  through bilateral LE, increased coordination and strengthening, tolerated well. Pt completed x10 stepping exercise for pre-gait activities to increase stability during stance phase. Propelled w/c to/from rehab gym 100' x2 w/ BUE and supervision. Completed SPT w/ 1 HHA w/c>recliner. Pt left seated in recliner w/ all needs within reach.    Therapy Documentation Precautions:  Precautions Precautions: Fall, Back Precaution Booklet Issued: No Precaution Comments: Reviewed back precautions Restrictions Weight Bearing Restrictions: No Vital Signs: Therapy Vitals Temp: 97.8 F (36.6 C) Temp Source: Oral Pulse Rate: 75 Resp: 17 BP: (!) 121/94 mmHg Patient Position (if appropriate): Lying Oxygen Therapy SpO2: 99 % O2 Device: Not Delivered Pain: Pain Assessment Pain Assessment: 0-10 Pain Score: 5  Pain Type: Surgical pain   See FIM for current functional status  Therapy/Group: Individual Therapy  Rada Hay 06/27/2014, 7:50 AM

## 2014-06-28 ENCOUNTER — Inpatient Hospital Stay (HOSPITAL_COMMUNITY): Payer: Medicare Other | Admitting: Occupational Therapy

## 2014-06-28 ENCOUNTER — Encounter (HOSPITAL_COMMUNITY): Payer: Medicare Other

## 2014-06-28 ENCOUNTER — Inpatient Hospital Stay (HOSPITAL_COMMUNITY): Payer: Medicare Other | Admitting: Physical Therapy

## 2014-06-28 ENCOUNTER — Ambulatory Visit: Payer: Medicare Other | Admitting: Internal Medicine

## 2014-06-28 ENCOUNTER — Inpatient Hospital Stay (HOSPITAL_COMMUNITY): Payer: Medicare Other | Admitting: *Deleted

## 2014-06-28 NOTE — Progress Notes (Signed)
Social Work Patient ID: Jeremiah Owens, male   DOB: 1930/01/28, 80 y.o.   MRN: [id number]   Met yesterday afternoon with pt and wife to review team conference.  Both pleased and agreeable with targeted d/c date of 12/4.  Feel pt is making good, steady progress.  Only questions at that time were re:  DME and f/u.  Will continue to follow for support and d/c planning.  Jeremiah Blyden, LCSW

## 2014-06-28 NOTE — Patient Care Conference (Signed)
Inpatient RehabilitationTeam Conference and Plan of Care Update Date: 06/27/2014   Time: 2:05 PM    Patient Name: Jeremiah Owens      Medical Record Number: [id number]  Date of Birth: 09/01/1929 Sex: Male         Room/Bed: 4M01C/4M01C-01 Payor Info: Payor: MEDICARE / Plan: MEDICARE PART A AND B / Product Type: *No Product type* /    Admitting Diagnosis: t5 cord compression sp fusion  Admit Date/Time:  06/20/2014  5:56 PM Admission Comments: No comment available   Primary Diagnosis:  <principal problem not specified> Principal Problem: <principal problem not specified>  Patient Active Problem List   Diagnosis Date Noted  . Multiple myeloma   . Paraplegia 06/21/2014  . Postoperative anemia due to acute blood loss 06/21/2014  . 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 Spine metastasis 06/17/2014  . Thoracic spine tumor 06/17/2014  . Metastatic bone cancer 06/15/2014    Expected Discharge Date: Expected Discharge Date: 07/07/14  Team Members Present: Physician leading conference: Dr. [name] Social Worker Present: Lennart Pall, LCSW Nurse Present: Elliot Cousin, RN PT Present: Canary Brim, Lorriane Shire, PT OT Present: Salome Spotted, OT;Jennifer Tamala Julian, OT;Patricia Lissa Hoard, OT PPS Coordinator present : Daiva Nakayama, RN, CRRN     Current Status/Progress Goal Weekly Team Focus  Medical   multiple myeolma to the spin. work up and treatment plan in progress. pain controlled. bowel and bladder  improve activity tolerance and balance  pain control, onc work up   Bowel/Bladder   Cont of bowel and bladder, LBM 11/23  Manage bowel and bladder with MOD I  remain cont of bowel and bladder   Swallow/Nutrition/ Hydration             ADL's   Overall Min A for B & D, toileting and transfers  Mod I dynamic standing balance, transfers, and grooming; supervision for lower body dressing, toileting and meal prep  Dynamic standing balance, endurance, transfers, AE use to maintain back  precautions   Mobility   Supervision for w/c propulsion short distances, MinA for ambulation up to 50', limited by endurance, balance, ataxic gait  mod (I) overall, supervision for car transfer, stair negotiation  gait training, standing balance, endurance   Communication             Safety/Cognition/ Behavioral Observations            Pain   Pain level between 5-7, Vicodin 1-2 tabs given PRN with relief  Pain level less than 4  Pain relief with PRN pain meds to less than 4   Skin   Incision to back with honeycomb dressing clean dry and intact  No new skin breakdown  Assess skin q shift for no new breakdown    Rehab Goals Patient on target to meet rehab goals: Yes *See Care Plan and progress notes for long and short-term goals.  Barriers to Discharge: pain, neuro deficits    Possible Resolutions to Barriers:  pain rx. NMR    Discharge Planning/Teaching Needs:  home with wife who can provide 24/7 assistance      Team Discussion:  Medically stable with his multiple myeloma.  Very motivated and working hard in tx.  Slow and gradual improvement but still with ataxic gait.  Goals @ mod I except for supervision stairs and LB ADLs.  Wife very involved.  Revisions to Treatment Plan:  None   Continued Need for Acute Rehabilitation Level of Care: The patient requires daily medical management by a  physician with specialized training in physical medicine and rehabilitation for the following conditions: Daily direction of a multidisciplinary physical rehabilitation program to ensure safe treatment while eliciting the highest outcome that is of practical value to the patient.: Yes Daily medical management of patient stability for increased activity during participation in an intensive rehabilitation regime.: Yes Daily analysis of laboratory values and/or radiology reports with any subsequent need for medication adjustment of medical intervention for : Neurological problems;Post surgical  problems  Larkyn Greenberger 06/28/2014, 11:26 AM

## 2014-06-28 NOTE — Progress Notes (Signed)
Social Work Patient ID: Nysir Fergusson, male   DOB: June 06, 1930, 80 y.o.   MRN: [id number]  Lennart Pall, LCSW Social Worker Signed  Patient Care Conference 06/28/2014 11:26 AM    Expand All Collapse All   Inpatient RehabilitationTeam Conference and Plan of Care Update Date: 06/27/2014   Time: 2:05 PM     Patient Name: Jeremiah Owens       Medical Record Number: [id number]  Date of Birth: 29-Feb-1930 Sex: Male         Room/Bed: 4M01C/4M01C-01 Payor Info: Payor: MEDICARE / Plan: MEDICARE PART A AND B / Product Type: *No Product type* /    Admitting Diagnosis: t5 cord compression sp fusion   Admit Date/Time:  06/20/2014  5:56 PM Admission Comments: No comment available   Primary Diagnosis:  <principal problem not specified> Principal Problem: <principal problem not specified>    Patient Active Problem List     Diagnosis  Date Noted   .  Multiple myeloma     .  Paraplegia  06/21/2014   .  Postoperative anemia due to acute blood loss  06/21/2014   .  Neoplasm of thoracic spine  06/20/2014   .  Spine metastasis  06/17/2014   .  Thoracic spine tumor  06/17/2014   .  Metastatic bone cancer  06/15/2014     Expected Discharge Date: Expected Discharge Date: 07/07/14  Team Members Present: Physician leading conference: Dr. [name] Social Worker Present: Lennart Pall, LCSW Nurse Present: Elliot Cousin, RN PT Present: Canary Brim, Lorriane Shire, PT OT Present: Salome Spotted, OT;Jennifer Tamala Julian, OT;Patricia Lissa Hoard, OT PPS Coordinator present : Daiva Nakayama, RN, CRRN        Current Status/Progress  Goal  Weekly Team Focus   Medical     multiple myeolma to the spin. work up and treatment plan in progress. pain controlled. bowel and bladder  improve activity tolerance and balance   pain control, onc work up   Bowel/Bladder     Cont of bowel and bladder, LBM 11/23   Manage bowel and bladder with MOD I   remain cont of bowel and bladder   Swallow/Nutrition/ Hydration                ADL's     Overall Min A for B & D, toileting and transfers   Mod I dynamic standing balance, transfers, and grooming; supervision for lower body dressing, toileting and meal prep  Dynamic standing balance, endurance, transfers, AE use to maintain back precautions   Mobility     Supervision for w/c propulsion short distances, MinA for ambulation up to 50', limited by endurance, balance, ataxic gait  mod (I) overall, supervision for car transfer, stair negotiation  gait training, standing balance, endurance    Communication               Safety/Cognition/ Behavioral Observations              Pain     Pain level between 5-7, Vicodin 1-2 tabs given PRN with relief   Pain level less than 4  Pain relief with PRN pain meds to less than 4   Skin     Incision to back with honeycomb dressing clean dry and intact   No new skin breakdown  Assess skin q shift for no new breakdown     Rehab Goals Patient on target to meet rehab goals: Yes *See Care Plan and progress notes for long and short-term goals.    Barriers  to Discharge:  pain, neuro deficits     Possible Resolutions to Barriers:   pain rx. NMR     Discharge Planning/Teaching Needs:   home with wife who can provide 24/7 assistance        Team Discussion:    Medically stable with his multiple myeloma.  Very motivated and working hard in tx.  Slow and gradual improvement but still with ataxic gait.  Goals @ mod I except for supervision stairs and LB ADLs.  Wife very involved.   Revisions to Treatment Plan:    None    Continued Need for Acute Rehabilitation Level of Care: The patient requires daily medical management by a physician with specialized training in physical medicine and rehabilitation for the following conditions: Daily direction of a multidisciplinary physical rehabilitation program to ensure safe treatment while eliciting the highest outcome that is of practical value to the patient.: Yes Daily medical management of  patient stability for increased activity during participation in an intensive rehabilitation regime.: Yes Daily analysis of laboratory values and/or radiology reports with any subsequent need for medication adjustment of medical intervention for : Neurological problems;Post surgical problems  Thurston Brendlinger 06/28/2014, 11:26 AM

## 2014-06-28 NOTE — Progress Notes (Signed)
Occupational Therapy Session Note  Patient Details  Name: Jeremiah Owens MRN: [id number] Date of Birth: Apr 16, 1930  Today's Date: 06/28/2014 OT Individual Time: 1000-1100 OT Individual Time Calculation (min): 60 min    Short Term Goals: Week 1:  OT Short Term Goal 1 (Week 1): Pt will complete toilet transfer at min assist level OT Short Term Goal 2 (Week 1): Pt will complete toilet task with min assist and min cues for adherence to precuations OT Short Term Goal 3 (Week 1): Pt will complete LB dressing with mod assist and use of AE OT Short Term Goal 4 (Week 1): Pt will complete self-care task in standing for 1 min with min assist for balance  Skilled Therapeutic Interventions/Progress Updates:    Pt engaged in BADL retraining including bathing at shower level, toilet transfers, toileting, shower transfers, and dressing with sit<>stand from w/c.  Pt amb with RW from doorway to shower seat and performed bathing seated on shower seat.  Pt amb with RW from shower to toilet before amb back to w/c at doorway.  Pt required min A when ambulating and steady A when standing for LB bathing and dressing tasks.  Pt completed all other tasks with supervision.  Pt used AE appropriately for LB bathing and dressing tasks.  Focus on activity tolerance, sit<>stand, standing balance, functional amb with RW, and safety awareness.   Therapy Documentation Precautions:  Precautions Precautions: Fall, Back Precaution Booklet Issued: No Precaution Comments: Reviewed back precautions Restrictions Weight Bearing Restrictions: No  Pain: Pain Assessment Pain Assessment: 0-10 Pain Score: 1  Faces Pain Scale: Hurts little more Pain Type: Surgical pain Pain Location: Back Pain Orientation: Upper Pain Descriptors / Indicators: Burning Pain Onset: On-going Pain Intervention(s): Repositioned;Rest Multiple Pain Sites: No  See FIM for current functional status  Therapy/Group: Individual Therapy  Leroy Libman 06/28/2014, 11:03 AM

## 2014-06-28 NOTE — Progress Notes (Signed)
Physical Therapy Weekly Progress Note  Patient Details  Name: Delman Goshorn MRN: [id number] Date of Birth: Jul 23, 1930  Beginning of progress report period: June 20, 2014 End of progress report period: June 28, 2014  Today's Date: 06/28/2014 PT Individual Time: 0830-0900 1130-1200 1400-1430 1530-1615 PT Individual Time Calculation (min): 30 min 30 min 30 min 45 min  Patient has met 4 of 5 short term goals.  Patient's gait goal not met due to with fatigue, pt reverts to req mod A for balance.   Patient continues to demonstrate the following deficits: impaired static and dynamic standing balance, ataxic gait, low activity tolerance and endurance, difficulty with ambulation/transfers/stairs, B LE weakness and therefore will continue to benefit from skilled PT intervention to enhance overall performance with activity tolerance, balance, postural control, coordination and implementing spinal precautions into functional mobility.  Patient progressing toward long term goals..  Continue plan of care.  PT Short Term Goals Week 1:  PT Short Term Goal 1 (Week 1): Pt will demonstrate sit to side lie transfer in bed req min A PT Short Term Goal 1 - Progress (Week 1): Met PT Short Term Goal 2 (Week 1): Pt will demonstrate stand step transfers with min A PT Short Term Goal 2 - Progress (Week 1): Met PT Short Term Goal 3 (Week 1): Pt will ambulate 16' with RW req min A PT Short Term Goal 3 - Progress (Week 1): Partly met PT Short Term Goal 4 (Week 1): Pt will ascend/descend 1 step with RW and min A PT Short Term Goal 4 - Progress (Week 1): Met PT Short Term Goal 5 (Week 1): Pt will increase Berg Balance Scale by 6 points to at least 10/56, indicating MCID PT Short Term Goal 5 - Progress (Week 1): Met Week 2:  PT Short Term Goal 1 (Week 2): STGs = LTgs due to ELOS  Skilled Therapeutic Interventions/Progress Updates:    Treatment Session 1: Therapeutic Activity: PT instructs pt in w/c to/from  bed transfer stand-step with RW req CGA for safety - significantly improved balance noted.  PT instructs pt in sit to supine transfer and pt completes with SBA, but does not pause in side lie.  PT instructs pt in log rolling in bed req SBA and in side lie to sit transfer req SBA for safety - good following of spinal precautions noted.  PT instructs pt in sit to side lie req mod A for B LEs, progressing to SBA with use of momentum x 5 reps - good following of spinal precautions noted. Pt completes side lie to sit transfer req SBA x 5 reps.  Therapeutic Exercise: PT instructs pt in side lie hip abduction 3 x 10 reps to B LEs - shoes worn for additional weight PT instructs pt in isometric hip adduction exercise in B hook lie with folded pillow between his knees x 10 reps.   Treatment Session 2: Gait Training: PT instructs pt in ambulation with RW req min A, progressing to mod A with fatigue (roughly halfway through each walk) x 70' x 3 reps.  PT instructs pt in ascending/descending practice stairs with B rails req min A x 5 steps and pt req verbal cues for technique/sequencing for safety.   Treatment Session 3: Neuromuscular Reeducation:  PT administers Berg Balance Test and pt scores: 10/56, indicating significant falls risk, but MCID achieved since last week.   Treatment Session 4:  Wife present during this session and observes treatment. PT spoke with wife and recommended  a ramp be built over the single 8" step to enter the home, due to possible patient fatigue when he is receiving his cancer treatment and it will be good to have the option of   Gait Training: PT instructs pt in ascending an 8" step backwards with RW req min A x 2 reps and descending it forward with RW req min A and verbal cues for technique.  PT instructs pt in ascending/descending a ramp with RW, including stepping off the end and stepping back on the end, req min A for balance x 2 reps.   Therapeutic Exercise: PT  instructs pt in bed level exercises that he can do tomorrow (Thanksgiving) on his own and gives him handouts and explains the handouts to him and pt verbalizes understanding.   Pt's standing balance has improved immensely in the past 3 days, but pt continues to have deficits that put him at risk for falls. PT instructs pt in bed level strengthening exercises to LEs that he can safely practice tomorrow (Thanksgiving) on his own, to assist in his progress. Pt's ability to complete stairs and gait endurance is progressing, as well. With fatigue, pt req mod A with gait, but pt now initially only req min A to ambulate with RW. Continue per PT POC.    Therapy Documentation Precautions:  Precautions Precautions: Fall, Back Precaution Booklet Issued: No Precaution Comments: Reviewed back precautions Restrictions Weight Bearing Restrictions: No Pain: Pain Assessment Pain Assessment: 0-10 Pain Score: 1  Pain Type: Surgical pain Pain Location: Back Pain Orientation: Upper Pain Descriptors / Indicators: Burning Pain Onset: On-going Pain Intervention(s): Repositioned;Rest Multiple Pain Sites: No  Treatment Session 2: Pt c/o 2/10 pain in upper back; PT uses rest and repositioning to decrease pain.  Treatment Session 3: Pt c/o 2/10 pain in upper back; PT uses rest and repositioning to decrease pain.  Treatment Session 4: Pt  C/o 1/10 pain in upper back; PT uses rest to decrease pain.    Balance: Balance Balance Assessed: Yes Standardized Balance Assessment Standardized Balance Assessment: Berg Balance Test Berg Balance Test Sit to Stand: Needs minimal aid to stand or to stabilize Standing Unsupported: Needs several tries to stand 30 seconds unsupported Sitting with Back Unsupported but Feet Supported on Floor or Stool: Able to sit safely and securely 2 minutes Stand to Sit: Sits independently, has uncontrolled descent Transfers: Able to transfer with verbal cueing and /or  supervision Standing Unsupported with Eyes Closed: Needs help to keep from falling Standing Ubsupported with Feet Together: Needs help to attain position and unable to hold for 15 seconds From Standing, Reach Forward with Outstretched Arm: Loses balance while trying/requires external support From Standing Position, Pick up Object from Floor: Unable to try/needs assist to keep balance From Standing Position, Turn to Look Behind Over each Shoulder: Needs assist to keep from losing balance and falling (NT due to spinal precautions) Turn 360 Degrees: Needs assistance while turning (min A to L; min-mod A to R) Standing Unsupported, Alternately Place Feet on Step/Stool: Able to complete >2 steps/needs minimal assist Standing Unsupported, One Foot in Front: Loses balance while stepping or standing Standing on One Leg: Unable to try or needs assist to prevent fall Total Score: 10  See FIM for current functional status  Therapy/Group: Individual Therapy  , M 06/28/2014, 8:35 AM

## 2014-06-28 NOTE — Progress Notes (Signed)
[place] PHYSICAL MEDICINE & REHABILITATION     PROGRESS NOTE    Subjective/Complaints: Continues to progress. A little anxious about his busy day of therapy ahead. Pain controlled A  review of systems has been performed and if not noted above is otherwise negative.   Objective: Vital Signs: Blood pressure 123/59, pulse 73, temperature 98.2 F (36.8 C), temperature source Oral, resp. rate 18, weight 77.1 kg (169 lb 15.6 oz), SpO2 100 %. No results found. No results for input(s): WBC, HGB, HCT, PLT in the last 72 hours. No results for input(s): NA, K, CL, GLUCOSE, BUN, CREATININE, CALCIUM in the last 72 hours.  Invalid input(s): CO CBG (last 3)  No results for input(s): GLUCAP in the last 72 hours.  Wt Readings from Last 3 Encounters:  06/21/14 77.1 kg (169 lb 15.6 oz)  06/17/14 73.3 kg (161 lb 9.6 oz)  06/15/14 74.844 kg (165 lb)    Physical Exam:  HENT: dentition good Head: Normocephalic.  Right Ear: External ear normal.  Left Ear: External ear normal.  Eyes: Conjunctivae and EOM are normal. Pupils are equal, round, and reactive to light. Right eye exhibits no discharge. Left eye exhibits no discharge.  Neck: Normal range of motion. Neck supple. No JVD present. No tracheal deviation present. No thyromegaly present.  Cardiovascular: Normal rate, regular rhythm and normal heart sounds.  Respiratory: Effort normal and breath sounds normal. No respiratory distress. He has no wheezes. He has no rales. He exhibits no tenderness.  GI: Soft. Bowel sounds are normal. He exhibits no distension. There is no tenderness. There is no rebound.  Musculoskeletal:  Upper back/mid back pain. Left leg a little sore Lymphadenopathy:   He has no cervical adenopathy.  Neurological: He is alert and oriented to person, place, and time.  Follows commands. No cn findings. Left shoulder limited due to pain/ tightness. Otherwise UE 4/5. LE: HF 3/5. KE 3+/5, ADF/APF 4/5. Decreased  LT/proprioception in both feet. No resting tone.  Skin: Skin is warm.  Back incision is clean and intact Psychiatric: He has a normal mood and affect. His behavior is normal. Judgment and thought content normal  Assessment/Plan: 1. 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 Physiatrist is providing close team supervision and 24 hour management of active medical problems listed below. Physiatrist and rehab team continue to assess barriers to discharge/monitor patient progress toward functional and medical goals. FIM: FIM - Bathing Bathing Steps Patient Completed: Chest, Right Arm, Left Arm, Abdomen, Front perineal area, Right upper leg, Left upper leg, Right lower leg (including foot), Left lower leg (including foot) Bathing: 4: Min-Patient completes 8-9 18f10 parts or 75+ percent  FIM - Upper Body Dressing/Undressing Upper body dressing/undressing steps patient completed: Thread/unthread right sleeve of pullover shirt/dresss, Thread/unthread left sleeve of pullover shirt/dress, Put head through opening of pull over shirt/dress, Pull shirt over trunk Upper body dressing/undressing: 5: Set-up assist to: Obtain clothing/put away FIM - Lower Body Dressing/Undressing Lower body dressing/undressing steps patient completed: Thread/unthread right underwear leg, Thread/unthread left underwear leg, Pull underwear up/down, Thread/unthread right pants leg, Thread/unthread left pants leg, Pull pants up/down, Don/Doff right shoe, Don/Doff left shoe Lower body dressing/undressing: 4: Min-Patient completed 75 plus % of tasks  FIM - Toileting Toileting steps completed by patient: Adjust clothing prior to toileting, Performs perineal hygiene, Adjust clothing after toileting Toileting Assistive Devices: Grab bar or rail  for support Toileting: 4:  Steadying assist  FIM - Radio producer Devices: Bedside commode, Environmental consultant, Product manager Transfers: 4-To toilet/BSC: Min A (steadying Pt. > 75%), 4-From toilet/BSC: Min A (steadying Pt. > 75%)  FIM - Bed/Chair Transfer Bed/Chair Transfer Assistive Devices: Walker, Arm rests Bed/Chair Transfer: 4: Bed > Chair or W/C: Min A (steadying Pt. > 75%), 4: Chair or W/C > Bed: Min A (steadying Pt. > 75%)  FIM - Locomotion: Wheelchair Distance: 150 Locomotion: Wheelchair: 2: Travels 42 - 149 ft with supervision, cueing or coaxing FIM - Locomotion: Ambulation Locomotion: Ambulation Assistive Devices: Administrator Ambulation/Gait Assistance: 4: Min assist Locomotion: Ambulation: 2: Travels 50 - 149 ft with minimal assistance (Pt.>75%)  Comprehension Comprehension Mode: Auditory Comprehension: 6-Follows complex conversation/direction: With extra time/assistive device  Expression Expression Mode: Verbal Expression: 7-Expresses complex ideas: With no assist  Social Interaction Social Interaction: 7-Interacts appropriately with others - No medications needed.  Problem Solving Problem Solving: 5-Solves complex 90% of the time/cues < 10% of the time  Memory Memory: 5-Requires cues to use assistive device  Medical Problem List and Plan: 1. Functional deficits secondary to multiple myeloma to thoracic spine status post decompression and fusion T4-7 06/18/2014  -sutures out soon 2. DVT Prophylaxis/Anticoagulation: SCDs. 3. Pain Management: Hydrocodone and Robaxin as needed. Monitor with increased mobility  -controlled at present, has positional/situation pain--discussed posture/ROM 4. Hypothyroidism. Synthroid 5. Neuropsych: This patient is capable of making decisions on his own behalf. 6. Skin/Wound Care: routine skin checks 7. Fluids/Electrolytes/Nutrition: strict I and O's. Follow-up chemistries. Provide nutritional supplements as needed. 8.BPH/history of  prostate cancer. Flomax 0.4 mg daily at bedtime. Emptying bladder without issues 9. Bowels: emptied over weekend 10. ABLA: labs reviewed. Encourage po intake 11. Onc: path + for MM.     -need to follow up with onc regarding plan before patient leaves next week   LOS (Days) 8 A FACE TO FACE EVALUATION WAS PERFORMED  SWARTZ,ZACHARY T 06/28/2014 7:50 AM

## 2014-06-28 NOTE — Progress Notes (Signed)
Occupational Therapy Session Note  Patient Details  Name: Jeremiah Owens MRN: [id number] Date of Birth: 1930-04-20  Today's Date: 06/28/2014 OT Individual Time: 1255-1355 OT Individual Time Calculation (min): 60 min    Short Term Goals: Week 1:  OT Short Term Goal 1 (Week 1): Pt will complete toilet transfer at min assist level OT Short Term Goal 2 (Week 1): Pt will complete toilet task with min assist and min cues for adherence to precuations OT Short Term Goal 3 (Week 1): Pt will complete LB dressing with mod assist and use of AE OT Short Term Goal 4 (Week 1): Pt will complete self-care task in standing for 1 min with min assist for balance  Skilled Therapeutic Interventions/Progress Updates:  Upon entering the room, pt seated in recliner chair awaiting pain medication. Pt reports pain as 4/10 in upper back this session. OT session with focus on functional mobility, dynamic standing balance, IADL tasks, and toileting. Pt ambulated to toilet with RW and Min A for balance. Toileting performed with Min A for dynamic standing balance for clothing management and hygiene. Pt propelled self in wheelchair to kitchen and engaged in standing task of reaching for items in cabinet while still maintaining precautions. Pt requiring Min A for standing kitchen task for safety and balance. OT educated on placing items most often used on lower cabinets or on kitchen counter for easy access. OT and Pt also discussed coping strategies as pt is upset over cancer dx. Pt was able to verbalize back precautions with 100% accuracy but requiring min verbal cues throughout session in order to maintain precautions. Pt seated in wheelchair with call bell within reach awaiting next therapist.   Therapy Documentation Precautions:  Precautions Precautions: Fall, Back Precaution Booklet Issued: No Precaution Comments: Reviewed back precautions Restrictions Weight Bearing Restrictions: No Pain: Pain Assessment Pain  Assessment: 0-10 Pain Score: 4 Pain Type: Surgical pain Pain Location: Back Pain Orientation: Upper Pain Descriptors / Indicators: Aching Pain Onset: On-going Pain Intervention(s): Rest Multiple Pain Sites: No  See FIM for current functional status  Therapy/Group: Individual Therapy  Phineas Semen 06/28/2014, 4:20 PM

## 2014-06-29 NOTE — Progress Notes (Signed)
[place] PHYSICAL MEDICINE & REHABILITATION     PROGRESS NOTE    Subjective/Complaints: No pain. Had some mild drainage from wound yesterday.  A  review of systems has been performed and if not noted above is otherwise negative.   Objective: Vital Signs: Blood pressure 118/57, pulse 78, temperature 97.9 F (36.6 C), temperature source Oral, resp. rate 18, weight 74.4 kg (164 lb 0.4 oz), SpO2 100 %. No results found. No results for input(s): WBC, HGB, HCT, PLT in the last 72 hours. No results for input(s): NA, K, CL, GLUCOSE, BUN, CREATININE, CALCIUM in the last 72 hours.  Invalid input(s): CO CBG (last 3)  No results for input(s): GLUCAP in the last 72 hours.  Wt Readings from Last 3 Encounters:  06/28/14 74.4 kg (164 lb 0.4 oz)  06/17/14 73.3 kg (161 lb 9.6 oz)  06/15/14 74.844 kg (165 lb)    Physical Exam:  HENT: dentition good Head: Normocephalic.  Right Ear: External ear normal.  Left Ear: External ear normal.  Eyes: Conjunctivae and EOM are normal. Pupils are equal, round, and reactive to light. Right eye exhibits no discharge. Left eye exhibits no discharge.  Neck: Normal range of motion. Neck supple. No JVD present. No tracheal deviation present. No thyromegaly present.  Cardiovascular: Normal rate, regular rhythm and normal heart sounds.  Respiratory: Effort normal and breath sounds normal. No respiratory distress. He has no wheezes. He has no rales. He exhibits no tenderness.  GI: Soft. Bowel sounds are normal. He exhibits no distension. There is no tenderness. There is no rebound.  Musculoskeletal:  Upper back/mid back pain. Left leg a little sore Lymphadenopathy:   He has no cervical adenopathy.  Neurological: He is alert and oriented to person, place, and time.  Follows commands. No cn findings. Left shoulder limited due to pain/ tightness. Otherwise UE 4/5. LE: HF 3/5. KE 3+/5, ADF/APF 4/5. Decreased LT/proprioception in both feet. No resting tone.   Skin: Skin is warm.  Back incision is clean and intact. Appears dry. No erythema Psychiatric: He has a normal mood and affect. His behavior is normal. Judgment and thought content normal  Assessment/Plan: 1. 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 Physiatrist is providing close team supervision and 24 hour management of active medical problems listed below. Physiatrist and rehab team continue to assess barriers to discharge/monitor patient progress toward functional and medical goals. FIM: FIM - Bathing Bathing Steps Patient Completed: Chest, Right Arm, Left Arm, Abdomen, Front perineal area, Right upper leg, Left upper leg, Right lower leg (including foot), Left lower leg (including foot) Bathing: 4: Min-Patient completes 8-9 84f10 parts or 75+ percent  FIM - Upper Body Dressing/Undressing Upper body dressing/undressing steps patient completed: Thread/unthread right sleeve of pullover shirt/dresss, Thread/unthread left sleeve of pullover shirt/dress, Put head through opening of pull over shirt/dress, Pull shirt over trunk Upper body dressing/undressing: 5: Set-up assist to: Obtain clothing/put away FIM - Lower Body Dressing/Undressing Lower body dressing/undressing steps patient completed: Thread/unthread right underwear leg, Thread/unthread left underwear leg, Pull underwear up/down, Thread/unthread right pants leg, Thread/unthread left pants leg, Pull pants up/down, Don/Doff right shoe, Don/Doff left shoe Lower body dressing/undressing: 4: Steadying Assist  FIM - Toileting Toileting steps completed by patient: Adjust clothing prior to toileting, Performs perineal hygiene, Adjust clothing after toileting Toileting Assistive Devices: Grab bar or rail for support Toileting: 4: Steadying assist  FIM - Sport and exercise psychologist Devices: Bedside commode, Environmental consultant, Product manager Transfers: 4-To toilet/BSC: Min A (steadying Pt. > 75%), 4-From toilet/BSC: Min A (steadying Pt. > 75%)  FIM - Bed/Chair Transfer Bed/Chair Transfer Assistive Devices: Walker, Arm rests Bed/Chair Transfer: 5: Sit > Supine: Supervision (verbal cues/safety issues), 5: Supine > Sit: Supervision (verbal cues/safety issues), 4: Bed > Chair or W/C: Min A (steadying Pt. > 75%), 4: Chair or W/C > Bed: Min A (steadying Pt. > 75%)  FIM - Locomotion: Wheelchair Distance: 150 Locomotion: Wheelchair: 5: Travels 150 ft or more: maneuvers on rugs and over door sills with supervision, cueing or coaxing FIM - Locomotion: Ambulation Locomotion: Ambulation Assistive Devices: Administrator Ambulation/Gait Assistance: 3: Mod assist Locomotion: Ambulation: 2: Travels 50 - 149 ft with moderate assistance (Pt: 50 - 74%)  Comprehension Comprehension Mode: Auditory Comprehension: 6-Follows complex conversation/direction: With extra time/assistive device  Expression Expression Mode: Verbal Expression: 7-Expresses complex ideas: With no assist  Social Interaction Social Interaction: 7-Interacts appropriately with others - No medications needed.  Problem Solving Problem Solving: 5-Solves complex 90% of the time/cues < 10% of the time  Memory Memory: 6-More than reasonable amt of time  Medical Problem List and Plan: 1. Functional deficits secondary to multiple myeloma to thoracic spine status post decompression and fusion T4-7 06/18/2014  -sutures out soon once drainage completely stops 2. DVT Prophylaxis/Anticoagulation: SCDs. 3. Pain Management: Hydrocodone and Robaxin as needed. Monitor with increased mobility  -controlled at present, has positional/situation pain--discussed posture/ROM 4. Hypothyroidism. Synthroid 5. Neuropsych: This patient is capable of making decisions on his own behalf. 6. Skin/Wound Care: routine skin  checks 7. Fluids/Electrolytes/Nutrition: strict I and O's. Follow-up chemistries. Provide nutritional supplements as needed. 8.BPH/history of prostate cancer. Flomax 0.4 mg daily at bedtime. Emptying bladder without issues 9. Bowels: emptied over weekend 10. ABLA: labs reviewed. Encourage po intake 11. Onc: path + for MM.     -need to follow up with onc regarding plan before patient leaves next week   LOS (Days) 9 A FACE TO FACE EVALUATION WAS PERFORMED  SWARTZ,ZACHARY T 06/29/2014 7:20 AM

## 2014-06-29 NOTE — Plan of Care (Signed)
Problem: RH SKIN INTEGRITY Goal: RH STG ABLE TO PERFORM INCISION/WOUND CARE W/ASSISTANCE STG Able To Perform Incision/Wound Care With total assist of caregiver.  Outcome: Progressing  Problem: RH SAFETY Goal: RH STG ADHERE TO SAFETY PRECAUTIONS W/ASSISTANCE/DEVICE STG Adhere to Safety Precautions With Assistance/Device. Mod I  Outcome: Progressing  Problem: RH PAIN MANAGEMENT Goal: RH STG PAIN MANAGED AT OR BELOW PT'S PAIN GOAL <5  Outcome: Progressing

## 2014-06-29 NOTE — Plan of Care (Signed)
Problem: RH BOWEL ELIMINATION Goal: RH STG MANAGE BOWEL WITH ASSISTANCE STG Manage Bowel with Assistance. Mod I  Outcome: Progressing Goal: RH STG MANAGE BOWEL W/MEDICATION W/ASSISTANCE STG Manage Bowel with Medication with Assistance.  Outcome: Progressing  Problem: RH SKIN INTEGRITY Goal: RH STG SKIN FREE OF INFECTION/BREAKDOWN Min A  Outcome: Progressing

## 2014-06-30 ENCOUNTER — Inpatient Hospital Stay (HOSPITAL_COMMUNITY): Payer: Medicare Other

## 2014-06-30 NOTE — Progress Notes (Signed)
Physical Therapy Session Note  Patient Details  Name: Jeremiah Owens MRN: [id number] Date of Birth: Mar 06, 1930  Today's Date: 06/30/2014 PT Individual Time: 1400-1500 PT Individual Time Calculation (min): 60 min   Short Term Goals: Week 2:  PT Short Term Goal 1 (Week 2): STGs = LTgs due to ELOS  Skilled Therapeutic Interventions/Progress Updates:  1:1. Pt received sitting in w/c, verbalized feeling very fatigued from OT session directly prior, but agreeable to participate as able. Focus this session on functional endurance during w/c propulsion as well as safety during functional transfers and mobility. Pt req supervision for w/c propulsion 150'x3 with B UE, demonstrating slow but steady pace and good negotiation around various pieces of furniture and doorways. Pt req close(S)-min guard A for multiple t/f sit<>stand and SPT with use of RW as well as safe performance of car transfer with min cues for seq. Brief education regarding use of w/c vs. RW for energy conservation when fatigued. Pt verbalized understanding.   Pt only amb short distances this session due to fatigue, min guard A overall for ambulation 25'x3 with RW. Pt practiced negotiation up/down 8" curb step x2 backwards with RW and min A.   Pt practiced t/f sup<>sit x3 on standard bed and then on elevated tx mat to simulate height of bed at home for emphasis on maintaining back precautions, intermittent use of RW for UE support. Pt req supervision with min cues overall.   Pt left sitting in recliner at end of session w/ all needs in reach.   Therapy Documentation Precautions:  Precautions Precautions: Fall, Back Precaution Booklet Issued: No Precaution Comments: Reviewed back precautions Restrictions Weight Bearing Restrictions: No  See FIM for current functional status  Therapy/Group: Individual Therapy  [name] 06/30/2014, 4:33 PM

## 2014-06-30 NOTE — Progress Notes (Signed)
Occupational Therapy Session Note  Patient Details  Name: Jeremiah Owens MRN: [id number] Date of Birth: [birth year]  Today's Date: 06/30/2014 OT Individual Time: 1100-1200 OT Individual Time Calculation (min): 60 min    Short Term Goals: Week 1:  OT Short Term Goal 1 (Week 1): Pt will complete toilet transfer at min assist level OT Short Term Goal 1 - Progress (Week 1): Met OT Short Term Goal 2 (Week 1): Pt will complete toilet task with min assist and min cues for adherence to precuations OT Short Term Goal 2 - Progress (Week 1): Met OT Short Term Goal 3 (Week 1): Pt will complete LB dressing with mod assist and use of AE OT Short Term Goal 3 - Progress (Week 1): Met OT Short Term Goal 4 (Week 1): Pt will complete self-care task in standing for 1 min with min assist for balance OT Short Term Goal 4 - Progress (Week 1): Met  Skilled Therapeutic Interventions/Progress Updates:    Pt seen for 1:1 OT session with focus on functional mobility, standing balance, functional transfers, and activity tolerance. Pt received supine in bed requesting to toilet. Pt ambulated bed>toilet with close supervision using RW. Completed clothing management, alternating UE strength with SBA. Ambulated to sink and complete grooming tasks of shaving and hand hygiene in standing for 1:30 with SBA. Pt propelled self in w/c to therapy gym and engaged in standing balance/tolerance activity of horseshoes game 3x with min-SBA for balance and rest breaks between each game. Pt propelled self to ADL apartment and practiced furniture transfer with CGA and bed mobility at supervision level. Engaged in functional mobility around apartment with SBA using RW. Pt returned to room and left sitting in w/c with all needs in reach.   Therapy Documentation Precautions:  Precautions Precautions: Fall, Back Precaution Booklet Issued: No Precaution Comments: Reviewed back precautions Restrictions Weight Bearing Restrictions:  No General:   Vital Signs:  Pain: Pain Assessment Pain Assessment: 0-10 Pain Location: Back Pain Orientation: Posterior;Upper Pain Descriptors / Indicators: Aching Pain Onset: On-going Pain Intervention(s): Medication (See eMAR) Multiple Pain Sites: No  See FIM for current functional status  Therapy/Group: Individual Therapy  Duayne Cal 06/30/2014, 12:02 PM

## 2014-06-30 NOTE — Progress Notes (Signed)
[facility] PHYSICAL MEDICINE & REHABILITATION     PROGRESS NOTE    Subjective/Complaints: Had a good bm last night. Back a little sore. Appetite good. A  review of systems has been performed and if not noted above is otherwise negative.   Objective: Vital Signs: Blood pressure 117/51, pulse 70, temperature 98.8 F (37.1 C), temperature source Oral, resp. rate 18, weight 74.4 kg (164 lb 0.4 oz), SpO2 99 %. No results found. No results for input(s): WBC, HGB, HCT, PLT in the last 72 hours. No results for input(s): NA, K, CL, GLUCOSE, BUN, CREATININE, CALCIUM in the last 72 hours.  Invalid input(s): CO CBG (last 3)  No results for input(s): GLUCAP in the last 72 hours.  Wt Readings from Last 3 Encounters:  06/28/14 74.4 kg (164 lb 0.4 oz)  06/17/14 73.3 kg (161 lb 9.6 oz)  06/15/14 74.844 kg (165 lb)    Physical Exam:  HENT: dentition good Head: Normocephalic.  Right Ear: External ear normal.  Left Ear: External ear normal.  Eyes: Conjunctivae and EOM are normal. Pupils are equal, round, and reactive to light. Right eye exhibits no discharge. Left eye exhibits no discharge.  Neck: Normal range of motion. Neck supple. No JVD present. No tracheal deviation present. No thyromegaly present.  Cardiovascular: Normal rate, regular rhythm and normal heart sounds.  Respiratory: Effort normal and breath sounds normal. No respiratory distress. He has no wheezes. He has no rales. He exhibits no tenderness.  GI: Soft. Bowel sounds are normal. He exhibits no distension. There is no tenderness. There is no rebound.  Musculoskeletal:  Head forward posture.  Lymphadenopathy:   He has no cervical adenopathy.  Neurological: He is alert and oriented to person, place, and time.  Follows commands. No cn findings. Left shoulder limited due to pain/ tightness. Otherwise UE 4/5. LE: HF 3+/5. KE 4/5, ADF/APF 4/5. Decreased LT/proprioception in both feet. No resting tone.  Skin: Skin is  warm.  Back incision is clean and intact. still dry. No erythema Psychiatric: He has a normal mood and affect. His behavior is normal. Judgment and thought content normal  Assessment/Plan: 1. 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 Physiatrist is providing close team supervision and 24 hour management of active medical problems listed below. Physiatrist and rehab team continue to assess barriers to discharge/monitor patient progress toward functional and medical goals. FIM: FIM - Bathing Bathing Steps Patient Completed:  (per report done by night staff) Bathing: 0: Activity did not occur (done by night staff per report)  FIM - Upper Body Dressing/Undressing Upper body dressing/undressing steps patient completed:  (B&D done by night staff) Upper body dressing/undressing:  (per report done by night staff) FIM - Lower Body Dressing/Undressing Lower body dressing/undressing steps patient completed:  (per report done by night staff) Lower body dressing/undressing:  (per report done by night staff)  FIM - Toileting Toileting steps completed by patient: Adjust clothing prior to toileting, Performs perineal hygiene Toileting Assistive Devices: Grab bar or rail for support Toileting: 3: Mod-Patient completed 2 of 3 steps  FIM - Radio producer Devices: Bedside commode, Environmental consultant, Grab bars Toilet Transfers: 4-To toilet/BSC: Min A (steadying Pt. > 75%), 4-From toilet/BSC: Min A (steadying Pt. > 75%)  FIM - Bed/Chair Transfer Bed/Chair Transfer Assistive Devices: Walker, Arm rests Bed/Chair Transfer: 4: Bed > Chair or W/C: Min A (steadying  Pt. > 75%)  FIM - Locomotion: Wheelchair Distance: 150 Locomotion: Wheelchair: 5: Travels 150 ft or more: maneuvers on rugs and over door sills with supervision, cueing or  coaxing FIM - Locomotion: Ambulation Locomotion: Ambulation Assistive Devices: Administrator Ambulation/Gait Assistance: 3: Mod assist Locomotion: Ambulation: 2: Travels 50 - 149 ft with moderate assistance (Pt: 50 - 74%)  Comprehension Comprehension Mode: Auditory Comprehension: 6-Follows complex conversation/direction: With extra time/assistive device  Expression Expression Mode: Verbal Expression: 7-Expresses complex ideas: With no assist  Social Interaction Social Interaction: 7-Interacts appropriately with others - No medications needed.  Problem Solving Problem Solving: 5-Solves complex 90% of the time/cues < 10% of the time  Memory Memory: 6-More than reasonable amt of time  Medical Problem List and Plan: 1. Functional deficits secondary to multiple myeloma to thoracic spine status post decompression and fusion T4-7 06/18/2014  -sutures out soon   2. DVT Prophylaxis/Anticoagulation: SCDs. 3. Pain Management: Hydrocodone and Robaxin as needed. Monitor with increased mobility  -controlled at present, has positional/situation pain--discussed posture/ROM 4. Hypothyroidism. Synthroid 5. Neuropsych: This patient is capable of making decisions on his own behalf. 6. Skin/Wound Care: routine skin checks 7. Fluids/Electrolytes/Nutrition: strict I and O's. Follow-up chemistries. Provide nutritional supplements as needed. 8.BPH/history of prostate cancer. Flomax 0.4 mg daily at bedtime. Emptying bladder without issues 9. Bowels: emptied over weekend 10. ABLA: labs reviewed. Encourage po intake 11. Onc: path + for MM.     -need to follow up with onc regarding plan before patient leaves next week   LOS (Days) 10 A FACE TO FACE EVALUATION WAS PERFORMED  SWARTZ,ZACHARY T 06/30/2014 7:36 AM

## 2014-06-30 NOTE — Progress Notes (Signed)
Occupational Therapy Weekly Progress Note  Patient Details  Name: Jeremiah Owens MRN: [id number] Date of Birth: 1929/10/05  Beginning of progress report period: June 20, 2014 End of progress report period: June 30, 2014  Today's Date: 06/30/2014 OT Individual Time: 4917-9150 OT Individual Time Calculation (min): 60 min    Patient has met 4 of 4 short term goals.  Pt continues to progress in all BADL with excellent awareness of limitations and improved skill using AE to maintain back precautions.   Pt now requires only steadying assist during transfers and dynamic standing balance with setup assist to reduce risk for fall, extra time with rest breaks to progress through task and intermittent supervision to maintain back precautions.  Patient continues to demonstrate the following deficits: Impaired endurance, impaired dynamic standing balance, LE weakness, impaired dynamic sitting balance with impaired posture and therefore will continue to benefit from skilled OT intervention to enhance overall performance with BADL.  Patient progressing toward long term goals..  Continue plan of care.  OT Short Term Goals Week 1:  OT Short Term Goal 1 (Week 1): Pt will complete toilet transfer at min assist level OT Short Term Goal 1 - Progress (Week 1): Met OT Short Term Goal 2 (Week 1): Pt will complete toilet task with min assist and min cues for adherence to precuations OT Short Term Goal 2 - Progress (Week 1): Met OT Short Term Goal 3 (Week 1): Pt will complete LB dressing with mod assist and use of AE OT Short Term Goal 3 - Progress (Week 1): Met OT Short Term Goal 4 (Week 1): Pt will complete self-care task in standing for 1 min with min assist for balance OT Short Term Goal 4 - Progress (Week 1): Met Week 2:  OT Short Term Goal 1 (Week 2): STG=LTG due to anticipated discharge in 7 days.  Skilled Therapeutic Interventions/Progress Updates: ADL-retraining with emphasis on functional  mobility, transfers, dynamic standing, toileting, and seated grooming.   Pt continues to improve performance with BADL and is now able to complete all tasks, ambulating with RW from bed to bathroom with min guard assist for safety.    No LOB noted during tasks and pt demo's good carry-over of AE skills training to maintain back precautions.    Short term goals were reviewed with pt and pt agreed with status of his progress to date.   LTG revised to downgrade dynamic standing balance and upgrade toileting and lower body dressing skills.   Pt left in w/c at end of session with all needs within reach.    Therapy Documentation Precautions:  Precautions Precautions: Fall, Back Precaution Booklet Issued: No Precaution Comments: Reviewed back precautions Restrictions Weight Bearing Restrictions: No   Pain: Pain Assessment Pain Assessment: 0-10 Pain Location: Back Pain Orientation: Posterior;Upper Pain Descriptors / Indicators: Aching Pain Onset: On-going Pain Intervention(s): Medication (See eMAR) Multiple Pain Sites: No  ADL: ADL Equipment Provided: Reacher, Sock aid, Long-handled shoe horn, Long-handled sponge Eating: Minimal cueing Where Assessed-Eating: Edge of bed Grooming: Setup Where Assessed-Grooming: Standing at sink, Sitting at sink Upper Body Bathing: Supervision/safety Where Assessed-Upper Body Bathing: Shower Lower Body Bathing: Minimal assistance Where Assessed-Lower Body Bathing: Shower Upper Body Dressing: Supervision/safety Where Assessed-Upper Body Dressing: Edge of bed Lower Body Dressing: Contact guard Where Assessed-Lower Body Dressing: Edge of bed Toileting: Minimal assistance Where Assessed-Toileting: Toilet, Recruitment consultant Transfer: Minimal Print production planner Method: Arts development officer: Bedside commode, Energy manager: Garment/textile technologist  Transfer Method: Stand pivot Engineer, production: Grab bars, Transfer tub bench  See FIM for current functional status  Therapy/Group: Individual Therapy   Second session: Time: 1300-1400 Time Calculation (min):  60 min  Pain Assessment: No/denies pain  Skilled Therapeutic Interventions: Therapeutic exercises (15 min) with focus on improved endurance and LE strengthening.  Pt performed NuStep, level 5, 15 min, rating exertion as "12" per BORG.   HR was 96 bpm at end of task.   Therapeutic activity (45 min) with focus on dynamic standing, functional mobility, and safety awareness.   Pt received in bathroom with RN tech assisting with toileting.   Pt stated that RN tech did not assist with mobility or clothing management but did perform thoroughness check after hygiene d/t multiple BM this date.   Pt ambulated to sink to wash his hands while standing supported at sink.   Pt recovered to w/c and propelled chair to ADL apt for education on use of transfer handle for bed transfer and mobility.   Pt then propelled w/c to gym to to perform therex followed by dynamic standing task of bending to pick up items from floor to transfer to table top top.   Pt completed pipe-tree task for 5 minutes standing supported at table but was unable to complete task without support of one hand on table.   Pt returned to his room at end of session with all needs within reach.  See FIM for current functional status  Therapy/Group: Individual Therapy  Tawn Fitzner 06/30/2014, 3:31 PM

## 2014-06-30 NOTE — Plan of Care (Signed)
Problem: RH BOWEL ELIMINATION Goal: RH STG MANAGE BOWEL WITH ASSISTANCE STG Manage Bowel with Assistance. Mod I  Outcome: Progressing Goal: RH STG MANAGE BOWEL W/MEDICATION W/ASSISTANCE STG Manage Bowel with Medication with Assistance.  Outcome: Progressing  Problem: RH SKIN INTEGRITY Goal: RH STG SKIN FREE OF INFECTION/BREAKDOWN No new skin breakdown/infection while on rehab with Min A of caregiver  Outcome: Progressing Goal: RH STG ABLE TO PERFORM INCISION/WOUND CARE W/ASSISTANCE STG Able To Perform Incision/Wound Care With total assist of caregiver.  Outcome: Progressing  Problem: RH SAFETY Goal: RH STG ADHERE TO SAFETY PRECAUTIONS W/ASSISTANCE/DEVICE STG Adhere to Safety Precautions With Assistance/Device. supervision  Outcome: Progressing  Problem: RH PAIN MANAGEMENT Goal: RH STG PAIN MANAGED AT OR BELOW PT'S PAIN GOAL <5  Outcome: Progressing

## 2014-07-01 ENCOUNTER — Inpatient Hospital Stay (HOSPITAL_COMMUNITY): Payer: Medicare Other | Admitting: *Deleted

## 2014-07-01 ENCOUNTER — Inpatient Hospital Stay (HOSPITAL_COMMUNITY): Payer: Medicare Other | Admitting: Occupational Therapy

## 2014-07-01 ENCOUNTER — Inpatient Hospital Stay (HOSPITAL_COMMUNITY): Payer: Medicare Other | Admitting: Physical Therapy

## 2014-07-01 NOTE — Progress Notes (Signed)
[facility] PHYSICAL MEDICINE & REHABILITATION     PROGRESS NOTE    Subjective/Complaints: No new complaints. Sleeping well. Walked yesterday with less help A  review of systems has been performed and if not noted above is otherwise negative.   Objective: Vital Signs: Blood pressure 105/51, pulse 71, temperature 98.7 F (37.1 C), temperature source Oral, resp. rate 20, weight 74.4 kg (164 lb 0.4 oz), SpO2 100 %. No results found. No results for input(s): WBC, HGB, HCT, PLT in the last 72 hours. No results for input(s): NA, K, CL, GLUCOSE, BUN, CREATININE, CALCIUM in the last 72 hours.  Invalid input(s): CO CBG (last 3)  No results for input(s): GLUCAP in the last 72 hours.  Wt Readings from Last 3 Encounters:  06/28/14 74.4 kg (164 lb 0.4 oz)  06/17/14 73.3 kg (161 lb 9.6 oz)  06/15/14 74.844 kg (165 lb)    Physical Exam:  HENT: dentition good Head: Normocephalic.  Right Ear: External ear normal.  Left Ear: External ear normal.  Eyes: Conjunctivae and EOM are normal. Pupils are equal, round, and reactive to light. Right eye exhibits no discharge. Left eye exhibits no discharge.  Neck: Normal range of motion. Neck supple. No JVD present. No tracheal deviation present. No thyromegaly present.  Cardiovascular: Normal rate, regular rhythm and normal heart sounds.  Respiratory: Effort normal and breath sounds normal. No respiratory distress. He has no wheezes. He has no rales. He exhibits no tenderness.  GI: Soft. Bowel sounds are normal. He exhibits no distension. There is no tenderness. There is no rebound.  Musculoskeletal:  Head forward posture.  Mild pedal edema Lymphadenopathy:   He has no cervical adenopathy.  Neurological: He is alert and oriented to person, place, and time.  Follows commands. No cn findings. Left shoulder limited due to pain/ tightness. Otherwise UE 4/5. LE: HF 4-5. KE 4/5, ADF/APF 4/5. Decreased LT/proprioception in both feet. No resting tone.   Skin: Skin is warm.  Back incision is clean and intact. still dry. No erythema Psychiatric: He has a normal mood and affect. His behavior is normal. Judgment and thought content normal  Assessment/Plan: 1. 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 Physiatrist is providing close team supervision and 24 hour management of active medical problems listed below. Physiatrist and rehab team continue to assess barriers to discharge/monitor patient progress toward functional and medical goals. FIM: FIM - Bathing Bathing Steps Patient Completed: Chest, Right Arm, Left Arm, Abdomen, Front perineal area, Right upper leg, Left upper leg, Right lower leg (including foot), Left lower leg (including foot) Bathing: 4: Min-Patient completes 8-9 [id number] parts or 75+ percent  FIM - Upper Body Dressing/Undressing Upper body dressing/undressing steps patient completed: Thread/unthread right sleeve of pullover shirt/dresss, Thread/unthread left sleeve of pullover shirt/dress, Put head through opening of pull over shirt/dress, Pull shirt over trunk Upper body dressing/undressing: 5: Set-up assist to: Obtain clothing/put away FIM - Lower Body Dressing/Undressing Lower body dressing/undressing steps patient completed: Thread/unthread right underwear leg, Thread/unthread left underwear leg, Pull underwear up/down, Thread/unthread right pants leg, Thread/unthread left pants leg, Don/Doff right sock, Don/Doff left sock, Pull pants up/down Lower body dressing/undressing: 4: Steadying Assist  FIM - Toileting Toileting steps completed by patient: Adjust clothing prior to toileting, Performs perineal hygiene, Adjust clothing after toileting Toileting Assistive Devices: Grab bar or rail for support Toileting: 5: Supervision: Safety issues/verbal cues  FIM - Glass blower/designer Devices: Environmental consultant, Engineer, civil (consulting), Product manager Transfers: 5-To toilet/BSC: Supervision (verbal cues/safety issues), 5-From toilet/BSC: Supervision (verbal cues/safety issues)  FIM - Control and instrumentation engineer Devices: Arm rests, Copy: 5: Supine > Sit: Supervision (verbal cues/safety issues), 5: Sit > Supine: Supervision (verbal cues/safety issues), 4: Bed > Chair or W/C: Min A (steadying Pt. > 75%), 4: Chair or W/C > Bed: Min A (steadying Pt. > 75%)  FIM - Locomotion: Wheelchair Distance: 150 Locomotion: Wheelchair: 5: Travels 150 ft or more: maneuvers on rugs and over door sills with supervision, cueing or coaxing FIM - Locomotion: Ambulation Locomotion: Ambulation Assistive Devices: Administrator Ambulation/Gait Assistance: 4: Min guard, 4: Min assist Locomotion: Ambulation: 1: Travels less than 50 ft with minimal assistance (Pt.>75%)  Comprehension Comprehension Mode: Auditory Comprehension: 6-Follows complex conversation/direction: With extra time/assistive device  Expression Expression Mode: Verbal Expression: 7-Expresses complex ideas: With no assist  Social Interaction Social Interaction: 7-Interacts appropriately with others - No medications needed.  Problem Solving Problem Solving: 5-Solves complex 90% of the time/cues < 10% of the time  Memory Memory: 7-Complete Independence: No helper  Medical Problem List and Plan: 1. Functional deficits secondary to multiple myeloma to thoracic spine status post decompression and fusion T4-7 06/18/2014  -sutures out this week 2. DVT Prophylaxis/Anticoagulation: SCDs. 3. Pain Management: Hydrocodone and Robaxin as needed. Monitor with increased mobility  -controlled at present, has positional/situation pain--discussed posture/ROM 4. Hypothyroidism. Synthroid 5. Neuropsych: This patient is capable of making decisions on his own behalf. 6.  Skin/Wound Care: routine skin checks 7. Fluids/Electrolytes/Nutrition: strict I and O's. Follow-up chemistries. Provide nutritional supplements as needed. 8.BPH/history of prostate cancer. Flomax 0.4 mg daily at bedtime. Emptying bladder without issues 9. Bowels: emptied over weekend 10. ABLA: labs reviewed. Encourage po intake 11. Onc: path + for MM.     -need to follow up with onc regarding plan before patient leaves next week   LOS (Days) 11 A FACE TO FACE EVALUATION WAS PERFORMED  SWARTZ,ZACHARY T 07/01/2014 7:38 AM

## 2014-07-01 NOTE — Progress Notes (Signed)
Physical Therapy Session Note  Patient Details  Name: Jeremiah Owens MRN: [id number] Date of Birth: [birth year]  Today's Date: 07/01/2014 PT Individual Time: 5615-3794 PT Individual Time Calculation (min): 60 min     Skilled Therapeutic Interventions/Progress Updates:  Patient very fatigued after previous sessions, but willng to participate in session. Session focused on gait training and therapeutic exercises with increased time to recover. Gait with RW 4x 75 feet with min A and VC for step length and to decrease WB through UE.  TE: 2x15 LAQ and hamstring curls with doubled blue thera-band resistance, hip abd against resistance in sitting 2 x15. In standing-marching 2 x 15 ,minin squats 2 x10 , hip abd in standing 2 x10. Patient left in room , in bed with alarm on and all needs within reach.  PT instructed patient on back precautions, needs cues to recall all precautions.Energy conservation education also provided. Patient stated he had pain prior to PT arrival and has just received pain medicine, no c/o pain during session.  Therapy Documentation Precautions:  Precautions Precautions: Fall, Back Precaution Booklet Issued: No Precaution Comments: Reviewed back precautions Restrictions Weight Bearing Restrictions: No Pain: Pain Assessment Pain Score: 0-No pain Pain Type: Surgical pain Pain Location: Back Pain Descriptors / Indicators: Aching Pain Intervention(s): Medication (See eMAR) Locomotion :     See FIM for current functional status  Therapy/Group: Individual Therapy  Guadlupe Spanish 07/01/2014, 3:02 PM

## 2014-07-01 NOTE — Progress Notes (Signed)
Physical Therapy Session Note  Patient Details  Name: Jeremiah Owens MRN: [id number] Date of Birth: 1930-04-30  Today's Date: 07/01/2014 PT Individual Time: 1005-1110 PT Individual Time Calculation (min): 65 min   Short Term Goals: Week 2:  PT Short Term Goal 1 (Week 2): STGs = LTgs due to ELOS  Skilled Therapeutic Interventions/Progress Updates:    Gait Training: PT instructs pt in ambulation x 200' req CGA-min A for first 150' then mod A for last 50' due to fatigue with a RW.  PT instructs pt in ascending/descending 5 practice stairs with B rails req CGA-min A x 2 reps and verbal cues to avoid twisting spine to maintain spinal precautions.   Therapeutic Exercise: PT instructs pt in standing exercises with RW: squats, heel raises, standing knee flexion: 3 x 10 reps each req CGA-min A for balance and verbal cues for technique. Focus is on balance and strengthening.   Neuromusuclar Reeducation: PT instructs pt in TUG x 3 reps and pt scores: 32 seconds, 27 seconds, and 25 seconds.   Pt is continuing to progress towards independence in functional mobility. With fatigue, pt's balance and endurance decrease and pt req increased assistance for safety, but pt is now grossly a CGA-min A level for functional mobility. Patient will benefit from beginning high level gait/balance exercises: side stepping, backwards walking, weaving around cones, and stepping over objects. Continue per PT POC.   Therapy Documentation Precautions:  Precautions Precautions: Fall, Back Precaution Booklet Issued: No Precaution Comments: Reviewed back precautions Restrictions Weight Bearing Restrictions: No  Pain: Pain Assessment Pain Assessment: 0-10 Pain Score: 6  Pain Type: Surgical pain Pain Location: Back Pain Orientation: Upper Pain Descriptors / Indicators: Aching Pain Onset: On-going Pain Intervention(s): RN made aware Multiple Pain Sites: No  Balance: Balance Balance Assessed: Yes Standardized  Balance Assessment Standardized Balance Assessment: Timed Up and Go Test Timed Up and Go Test TUG: Normal TUG Normal TUG (seconds): 25 See FIM for current functional status  Therapy/Group: Individual Therapy  Cathi Hazan M 07/01/2014, 10:11 AM

## 2014-07-01 NOTE — Plan of Care (Signed)
Problem: RH BOWEL ELIMINATION Goal: RH STG MANAGE BOWEL WITH ASSISTANCE STG Manage Bowel with Assistance. Mod I  Outcome: Progressing Goal: RH STG MANAGE BOWEL W/MEDICATION W/ASSISTANCE STG Manage Bowel with Medication with Assistance.  Outcome: Progressing  Problem: RH SKIN INTEGRITY Goal: RH STG SKIN FREE OF INFECTION/BREAKDOWN No new skin breakdown/infection while on rehab with Min A of caregiver  Outcome: Progressing  Problem: RH SAFETY Goal: RH STG ADHERE TO SAFETY PRECAUTIONS W/ASSISTANCE/DEVICE STG Adhere to Safety Precautions With Assistance/Device. supervision  Outcome: Progressing  Problem: RH PAIN MANAGEMENT Goal: RH STG PAIN MANAGED AT OR BELOW PT'S PAIN GOAL <5  Outcome: Progressing

## 2014-07-01 NOTE — Progress Notes (Signed)
Occupational Therapy Session Note  Patient Details  Name: Jeremiah Owens MRN: [id number] Date of Birth: 12-May-1930  Today's Date: 07/01/2014 OT Individual Time: 8978-4784 OT Individual Time Calculation (min): 60 min    Short Term Goals: Week 1:  OT Short Term Goal 1 (Week 1): Pt will complete toilet transfer at min assist level OT Short Term Goal 1 - Progress (Week 1): Met OT Short Term Goal 2 (Week 1): Pt will complete toilet task with min assist and min cues for adherence to precuations OT Short Term Goal 2 - Progress (Week 1): Met OT Short Term Goal 3 (Week 1): Pt will complete LB dressing with mod assist and use of AE OT Short Term Goal 3 - Progress (Week 1): Met OT Short Term Goal 4 (Week 1): Pt will complete self-care task in standing for 1 min with min assist for balance OT Short Term Goal 4 - Progress (Week 1): Met Week 2:  OT Short Term Goal 1 (Week 2): STG=LTG due to anticipated discharge in 7 days.  Skilled Therapeutic Interventions/Progress Updates:  Upon entering the room, pt seated in wheelchair awaiting therapist. OT session with focus on self care, dynamic standing balance, and energy conservation. Pt with 4/10 c/o pain in upper back with reports that pain medication recently taken. Pt ambulated with use of RW with supervision and transfer onto TTB with steady assist. Bathing performed in shower while seated on TTB with use of LH sponge and supervision only. Pt utilized LH reacher and shoe horn for LB dressing to maintain precautions. Pt requiring verbal cues for safety to sit to thread pants/underwear onto B LEs for safety and energy conservation. Upon entering the room, pt seated in recliner chair with call bell and all other needed items within reach.   Therapy Documentation Precautions:  Precautions Precautions: Fall, Back Precaution Booklet Issued: No Precaution Comments: Reviewed back precautions Restrictions Weight Bearing Restrictions: No   Pain: Pain  Assessment Pain Assessment: 0-10 Pain Score: 4  Pain Type: Surgical pain Pain Location: Back Pain Orientation: Upper Pain Descriptors / Indicators: Aching Pain Onset: On-going Pain Intervention(s): Medication (See eMAR) Multiple Pain Sites: No ADL: ADL Equipment Provided: Reacher, Sock aid, Long-handled shoe horn, Long-handled sponge Eating: Minimal cueing Where Assessed-Eating: Edge of bed Grooming: Setup Where Assessed-Grooming: Standing at sink, Sitting at sink Upper Body Bathing: Supervision/safety Where Assessed-Upper Body Bathing: Shower Lower Body Bathing: Minimal assistance Where Assessed-Lower Body Bathing: Shower Upper Body Dressing: Supervision/safety Where Assessed-Upper Body Dressing: Edge of bed Lower Body Dressing: Contact guard Where Assessed-Lower Body Dressing: Edge of bed Toileting: Minimal assistance Where Assessed-Toileting: Toilet, Recruitment consultant Transfer: Minimal Print production planner Method: Arts development officer: Bedside commode, Energy manager: Environmental education officer Method: Radiographer, therapeutic: Grab bars, Transfer tub bench  See FIM for current functional status  Therapy/Group: Individual Therapy  Phineas Semen 07/01/2014, 10:45 AM

## 2014-07-02 ENCOUNTER — Inpatient Hospital Stay (HOSPITAL_COMMUNITY): Payer: Medicare Other

## 2014-07-02 NOTE — Progress Notes (Signed)
Sutures removed from upper back patient tolerated well.

## 2014-07-02 NOTE — Plan of Care (Signed)
Problem: RH PAIN MANAGEMENT Goal: RH STG PAIN MANAGED AT OR BELOW PT'S PAIN GOAL <5  Outcome: Not Progressing Consistently rating pain > 5.

## 2014-07-02 NOTE — Plan of Care (Signed)
Problem: RH BOWEL ELIMINATION Goal: RH STG MANAGE BOWEL WITH ASSISTANCE STG Manage Bowel with Assistance. Mod I  Outcome: Progressing     

## 2014-07-02 NOTE — Plan of Care (Signed)
Problem: RH BOWEL ELIMINATION Goal: RH STG MANAGE BOWEL W/MEDICATION W/ASSISTANCE STG Manage Bowel with Medication with min Assistance.  Outcome: Progressing     

## 2014-07-02 NOTE — Progress Notes (Signed)
Physical Therapy Session Note  Patient Details  Name: Jeremiah Owens MRN: [id number] Date of Birth: 08-Mar-1930  Today's Date: 07/02/2014 PT Individual Time: 8270-7867 PT Individual Time Calculation (min): 30 min   Short Term Goals: Week 2:  PT Short Term Goal 1 (Week 2): STGs = LTgs due to ELOS  Skilled Therapeutic Interventions/Progress Updates:    Pt received seated in recliner, agreeable to participate in therapy. Pt ambulated 100' to rehab gym w/ RW and MinGuard. For high level balance and dynamic gait, Pt ambulated 40' w/ horizontal head turns, then 20' with vertical head turns. Negotiated around obstacles including weaving around cones, stepping over cones. Pt generally required MinGuard A to ambulate, but required up to Mod when fatigued or when trying to perform dual cognitive task (holding conversation while walking), as gait pattern would diminish and pt would begin scissoring and shuffling feet. Pt completed 4' on Nustep L8 w/ LE only for increased proprioceptive and sensory feedback in BLE. Pt w/ SPT Nustep>w/c, then w/c>recliner w/ RW and MinGuardA. Pt left seated in recliner w/ all needs within reach.   Therapy Documentation Precautions:  Precautions Precautions: Fall, Back Precaution Booklet Issued: No Precaution Comments: Reviewed back precautions Restrictions Weight Bearing Restrictions: No Vital Signs: Therapy Vitals Temp: 97.6 F (36.4 C) Temp Source: Oral Pulse Rate: 79 Resp: 17 BP: (!) 111/54 mmHg Patient Position (if appropriate): Sitting Oxygen Therapy SpO2: 100 % O2 Device: Not Delivered Pain:  No/denies pain  See FIM for current functional status  Therapy/Group: Individual Therapy  Rada Hay 07/02/2014, 4:33 PM

## 2014-07-02 NOTE — Progress Notes (Signed)
[place] PHYSICAL MEDICINE & REHABILITATION     PROGRESS NOTE    Subjective/Complaints: Feeling well. Good appetite. Pain controlled A  review of systems has been performed and if not noted above is otherwise negative.   Objective: Vital Signs: Blood pressure 123/52, pulse 68, temperature 98.3 F (36.8 C), temperature source Oral, resp. rate 18, weight 74.4 kg (164 lb 0.4 oz), SpO2 99 %. No results found. No results for input(s): WBC, HGB, HCT, PLT in the last 72 hours. No results for input(s): NA, K, CL, GLUCOSE, BUN, CREATININE, CALCIUM in the last 72 hours.  Invalid input(s): CO CBG (last 3)  No results for input(s): GLUCAP in the last 72 hours.  Wt Readings from Last 3 Encounters:  06/28/14 74.4 kg (164 lb 0.4 oz)  06/17/14 73.3 kg (161 lb 9.6 oz)  06/15/14 74.844 kg (165 lb)    Physical Exam:  HENT: dentition good Head: Normocephalic.  Right Ear: External ear normal.  Left Ear: External ear normal.  Eyes: Conjunctivae and EOM are normal. Pupils are equal, round, and reactive to light. Right eye exhibits no discharge. Left eye exhibits no discharge.  Neck: Normal range of motion. Neck supple. No JVD present. No tracheal deviation present. No thyromegaly present.  Cardiovascular: Normal rate, regular rhythm and normal heart sounds.  Respiratory: Effort normal and breath sounds normal. No respiratory distress. He has no wheezes. He has no rales. He exhibits no tenderness.  GI: Soft. Bowel sounds are normal. He exhibits no distension. There is no tenderness. There is no rebound.  Musculoskeletal:  Head forward posture.  Mild pedal edema Lymphadenopathy:   He has no cervical adenopathy.  Neurological: He is alert and oriented to person, place, and time.  Follows commands. No cn findings. Left shoulder limited due to pain/ tightness. Otherwise UE 4/5. LE: HF 4-5. KE 4/5, ADF/APF 4/5. Decreased LT/proprioception in both feet. No resting tone.  Skin: Skin is  warm.  Back incision is clean and intact.  dry. No erythema Psychiatric: He has a normal mood and affect. His behavior is normal. Judgment and thought content normal  Assessment/Plan: 1. 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 Physiatrist is providing close team supervision and 24 hour management of active medical problems listed below. Physiatrist and rehab team continue to assess barriers to discharge/monitor patient progress toward functional and medical goals.  Improving strength and mobility  FIM: FIM - Bathing Bathing Steps Patient Completed: Chest, Right Arm, Left Arm, Abdomen, Front perineal area, Buttocks, Left lower leg (including foot), Right lower leg (including foot), Left upper leg, Right upper leg Bathing: 5: Supervision: Safety issues/verbal cues  FIM - Upper Body Dressing/Undressing Upper body dressing/undressing steps patient completed: Put head through opening of pull over shirt/dress, Pull shirt over trunk, Thread/unthread right sleeve of pullover shirt/dresss, Thread/unthread left sleeve of pullover shirt/dress Upper body dressing/undressing: 5: Set-up assist to: Obtain clothing/put away FIM - Lower Body Dressing/Undressing Lower body dressing/undressing steps patient completed: Thread/unthread right underwear leg, Thread/unthread left underwear leg, Pull underwear up/down, Thread/unthread right pants leg, Thread/unthread left pants leg, Don/Doff right sock, Don/Doff left sock, Pull pants up/down, Don/Doff right shoe, Don/Doff left shoe Lower body dressing/undressing: 4: Steadying Assist  FIM - Toileting Toileting steps completed by patient: Adjust clothing prior to toileting, Performs perineal hygiene, Adjust clothing after toileting Toileting Assistive Devices: Grab bar or rail for support Toileting: 5:  Supervision: Safety issues/verbal cues  FIM - Air cabin crew Transfers Assistive Devices: Environmental consultant, Bedside commode, Product manager Transfers: 5-To toilet/BSC: Supervision (verbal cues/safety issues), 5-From toilet/BSC: Supervision (verbal cues/safety issues)  FIM - Control and instrumentation engineer Devices: Walker, Arm rests Bed/Chair Transfer: 4: Bed > Chair or W/C: Min A (steadying Pt. > 75%), 4: Chair or W/C > Bed: Min A (steadying Pt. > 75%)  FIM - Locomotion: Wheelchair Distance: 150 Locomotion: Wheelchair: 5: Travels 150 ft or more: maneuvers on rugs and over door sills with supervision, cueing or coaxing FIM - Locomotion: Ambulation Locomotion: Ambulation Assistive Devices: Administrator Ambulation/Gait Assistance: 4: Min assist, 4: Min guard Locomotion: Ambulation: 4: Travels 150 ft or more with minimal assistance (Pt.>75%)  Comprehension Comprehension Mode: Auditory Comprehension: 6-Follows complex conversation/direction: With extra time/assistive device  Expression Expression Mode: Verbal Expression: 7-Expresses complex ideas: With no assist  Social Interaction Social Interaction: 7-Interacts appropriately with others - No medications needed.  Problem Solving Problem Solving: 5-Solves complex 90% of the time/cues < 10% of the time  Memory Memory: 7-Complete Independence: No helper  Medical Problem List and Plan: 1. Functional deficits secondary to multiple myeloma to thoracic spine status post decompression and fusion T4-7 06/18/2014  -sutures out today  -reviewed post-operative images with patient so that he could see what was done. 2. DVT Prophylaxis/Anticoagulation: SCDs. 3. Pain Management: Hydrocodone and Robaxin as needed. Monitor with increased mobility  -controlled at present, has positional/situation pain--discussed posture/ROM 4. Hypothyroidism. Synthroid 5. Neuropsych: This patient is capable of making decisions on his own  behalf. 6. Skin/Wound Care: routine skin checks 7. Fluids/Electrolytes/Nutrition: strict I and O's. Follow-up chemistries. Provide nutritional supplements as needed. 8.BPH/history of prostate cancer. Flomax 0.4 mg daily at bedtime. Emptying bladder without issues 9. Bowels: emptied over weekend 10. ABLA: labs reviewed. Encourage po intake 11. Onc: path + for MM.     -need to follow up with onc regarding plan before patient leaves next week   LOS (Days) 12 A FACE TO FACE EVALUATION WAS PERFORMED  SWARTZ,ZACHARY T 07/02/2014 7:35 AM

## 2014-07-02 NOTE — Plan of Care (Signed)
Problem: RH SKIN INTEGRITY Goal: RH STG SKIN FREE OF INFECTION/BREAKDOWN No new skin breakdown/infection while on rehab with Min A of caregiver  Outcome: Progressing

## 2014-07-02 NOTE — Plan of Care (Signed)
Problem: RH BOWEL ELIMINATION Goal: RH STG MANAGE BOWEL WITH ASSISTANCE STG Manage Bowel with Assistance. Mod I  Outcome: Progressing Goal: RH STG MANAGE BOWEL W/MEDICATION W/ASSISTANCE STG Manage Bowel with Medication with Assistance.  Outcome: Progressing  Problem: RH SKIN INTEGRITY Goal: RH STG SKIN FREE OF INFECTION/BREAKDOWN No new skin breakdown/infection while on rehab with Min A of caregiver  Outcome: Progressing Goal: RH STG ABLE TO PERFORM INCISION/WOUND CARE W/ASSISTANCE STG Able To Perform Incision/Wound Care With total assist of caregiver.  Outcome: Progressing  Problem: RH SAFETY Goal: RH STG ADHERE TO SAFETY PRECAUTIONS W/ASSISTANCE/DEVICE STG Adhere to Safety Precautions With Assistance/Device. supervision  Outcome: Progressing

## 2014-07-02 NOTE — Plan of Care (Signed)
Problem: RH PAIN MANAGEMENT Goal: RH STG PAIN MANAGED AT OR BELOW PT'S PAIN GOAL <5  Outcome: Progressing

## 2014-07-02 NOTE — Progress Notes (Signed)
+/-   sleep. Complains of pain/pressure when positioned on back. Able to turn independently. Honeycomb dressing to upper back C D & I. PRN vicodin managing pain. Patrici Ranks A

## 2014-07-03 ENCOUNTER — Ambulatory Visit: Payer: Medicare Other | Admitting: Radiation Oncology

## 2014-07-03 ENCOUNTER — Inpatient Hospital Stay (HOSPITAL_COMMUNITY): Payer: Medicare Other

## 2014-07-03 ENCOUNTER — Telehealth: Payer: Self-pay | Admitting: Internal Medicine

## 2014-07-03 ENCOUNTER — Ambulatory Visit: Payer: Medicare Other

## 2014-07-03 LAB — TISSUE HYBRIDIZATION (BONE MARROW)-NCBH

## 2014-07-03 LAB — CHROMOSOME ANALYSIS, BONE MARROW

## 2014-07-03 NOTE — Progress Notes (Signed)
[facility] PHYSICAL MEDICINE & REHABILITATION     PROGRESS NOTE    Subjective/Complaints: Back sore. No bm yesterday. Otherwise doing well. Worked a lot with therapy yesterday A  review of systems has been performed and if not noted above is otherwise negative.   Objective: Vital Signs: Blood pressure 117/47, pulse 68, temperature 98.4 F (36.9 C), temperature source Oral, resp. rate 17, weight 74.4 kg (164 lb 0.4 oz), SpO2 98 %. No results found. No results for input(s): WBC, HGB, HCT, PLT in the last 72 hours. No results for input(s): NA, K, CL, GLUCOSE, BUN, CREATININE, CALCIUM in the last 72 hours.  Invalid input(s): CO CBG (last 3)  No results for input(s): GLUCAP in the last 72 hours.  Wt Readings from Last 3 Encounters:  06/28/14 74.4 kg (164 lb 0.4 oz)  06/17/14 73.3 kg (161 lb 9.6 oz)  06/15/14 74.844 kg (165 lb)    Physical Exam:  HENT: dentition good Head: Normocephalic.  Right Ear: External ear normal.  Left Ear: External ear normal.  Eyes: Conjunctivae and EOM are normal. Pupils are equal, round, and reactive to light. Right eye exhibits no discharge. Left eye exhibits no discharge.  Neck: Normal range of motion. Neck supple. No JVD present. No tracheal deviation present. No thyromegaly present.  Cardiovascular: Normal rate, regular rhythm and normal heart sounds.  Respiratory: Effort normal and breath sounds normal. No respiratory distress. He has no wheezes. He has no rales. He exhibits no tenderness.  GI: Soft. Bowel sounds are normal. He exhibits no distension. There is no tenderness. There is no rebound.  Musculoskeletal:  Head forward posture.  Mild pedal edema Lymphadenopathy:   He has no cervical adenopathy.  Neurological: He is alert and oriented to person, place, and time.  Follows commands. No cn findings. Left shoulder limited due to pain/ tightness. Otherwise UE 4/5. LE: HF 4-5. KE 4/5, ADF/APF 4/5. Decreased LT/proprioception in both  feet. No resting tone.  Skin: Skin is warm.  Back incision is clean and intact--sutures out  dry. No erythema Psychiatric: He has a normal mood and affect. His behavior is normal. Judgment and thought content normal  Assessment/Plan: 1. 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 Physiatrist is providing close team supervision and 24 hour management of active medical problems listed below. Physiatrist and rehab team continue to assess barriers to discharge/monitor patient progress toward functional and medical goals.  Improving strength and mobility  FIM: FIM - Bathing Bathing Steps Patient Completed: Chest, Left Arm, Buttocks, Right Arm, Front perineal area, Abdomen, Right upper leg, Left upper leg, Right lower leg (including foot), Left lower leg (including foot) Bathing: 5: Supervision: Safety issues/verbal cues  FIM - Upper Body Dressing/Undressing Upper body dressing/undressing steps patient completed: Thread/unthread right sleeve of pullover shirt/dresss, Thread/unthread left sleeve of pullover shirt/dress, Put head through opening of pull over shirt/dress, Pull shirt over trunk Upper body dressing/undressing: 5: Supervision: Safety issues/verbal cues FIM - Lower Body Dressing/Undressing Lower body dressing/undressing steps patient completed: Thread/unthread right underwear leg, Thread/unthread left underwear leg, Pull underwear up/down, Thread/unthread right pants leg, Pull pants up/down, Thread/unthread left pants leg, Don/Doff right sock, Don/Doff left sock Lower body dressing/undressing: 6: Assistive device (Comment)  FIM - Toileting Toileting steps completed by patient: Adjust clothing prior to toileting, Performs perineal hygiene, Adjust clothing after toileting Toileting Assistive Devices: Grab bar or rail for  support Toileting: 5: Supervision: Safety issues/verbal cues  FIM - Radio producer Devices: Environmental consultant, Bedside commode, Product manager Transfers: 5-To toilet/BSC: Supervision (verbal cues/safety issues), 5-From toilet/BSC: Supervision (verbal cues/safety issues)  FIM - Control and instrumentation engineer Devices: Arm rests Bed/Chair Transfer: 5: Chair or W/C > Bed: Supervision (verbal cues/safety issues), 5: Bed > Chair or W/C: Supervision (verbal cues/safety issues), 5: Sit > Supine: Supervision (verbal cues/safety issues), 5: Supine > Sit: Supervision (verbal cues/safety issues)  FIM - Locomotion: Wheelchair Distance: 150 Locomotion: Wheelchair: 5: Travels 150 ft or more: maneuvers on rugs and over door sills with supervision, cueing or coaxing FIM - Locomotion: Ambulation Locomotion: Ambulation Assistive Devices: Administrator Ambulation/Gait Assistance: 4: Min assist, 4: Min guard Locomotion: Ambulation: 4: Travels 150 ft or more with minimal assistance (Pt.>75%)  Comprehension Comprehension Mode: Auditory Comprehension: 6-Follows complex conversation/direction: With extra time/assistive device  Expression Expression Mode: Verbal Expression: 7-Expresses complex ideas: With no assist  Social Interaction Social Interaction: 7-Interacts appropriately with others - No medications needed.  Problem Solving Problem Solving: 5-Solves complex 90% of the time/cues < 10% of the time  Memory Memory: 7-Complete Independence: No helper  Medical Problem List and Plan: 1. Functional deficits secondary to multiple myeloma to thoracic spine status post decompression and fusion T4-7 06/18/2014  -sutures out   -reviewed post-operative images with patient so that he could see what was done. 2. DVT Prophylaxis/Anticoagulation: SCDs. 3. Pain Management: Hydrocodone and Robaxin as needed. Monitor with increased mobility  -again discussed  posture/ROM 4. Hypothyroidism. Synthroid 5. Neuropsych: This patient is capable of making decisions on his own behalf. 6. Skin/Wound Care: routine skin checks 7. Fluids/Electrolytes/Nutrition: strict I and O's.   -recheck bmet tomorrow 8.BPH/history of prostate cancer. Flomax 0.4 mg daily at bedtime. Emptying bladder without issues 9. Bowels: emptied over weekend 10. ABLA: recheck tomorrow 11. Onc: path + for MM.     -need to follow up with onc regarding plan prior to dc   LOS (Days) 13 A FACE TO FACE EVALUATION WAS PERFORMED  Jeremiah Owens T 07/03/2014 7:32 AM

## 2014-07-03 NOTE — Progress Notes (Signed)
Physical Therapy Session Note  Patient Details  Name: Jeremiah Owens MRN: [id number] Date of Birth: 03-18-1930  Today's Date: 07/03/2014 PT Individual Time: 0900-1000 PT Individual Time Calculation (min): 60 min   Session 2 Time: 1300-1400 Time Calculation (min): 60 Session 3 Time: 1500-1600 Time Calculation (min): 60  Short Term Goals: Week 2:  PT Short Term Goal 1 (Week 2): STGs = LTgs due to ELOS  Skilled Therapeutic Interventions/Progress Updates:    Session 1: Pt received seated in w/c, agreeable to participate in therapy. Donned TED hose w/ MaxA, then donned shoes w/ setup assist. Pt propelled w/c 100' to rehab gym w/ supervision. SPT w/ RW and close S for safety. Worked on bed mobility on flat mat table to simulate home environment, supine <> sit and rolling w/ supervision, min cueing to maintain precautions. Mat exercises for proximal strengthening including isometric hip adduction, heel slides x10 ea LE. For dynamic sitting balance pt tossed theraball back/forth with therapist while sitting on airdisc. For improved pelvic mobility and weight shifting, pt completed reaching activity for horseshoes at mod LOS to R and to L while sitting on airdisc. For quad strengthening and bilateral coordination pt completed x5 sit<>stand while holding soccer ball for forced use of BLE, MinA to come to standing, pt stabilized back of legs against table to stand each time. Pt ambulated 100' w/ RW and MinGuard back to room. Pt asked that therapist leave wheelchair close to him so that he could get in it if he needed to get to bed. Pt educated on falls risk in room and need to use call bell and have supervision assist for transfers, as transitioning is a high risk area and he requires assist to ensure proper setup and steady wheelchair. Pt agreed to use call bell and call for help if he needed to get up. Pt left seated in recliner w/ all needs within reach.   Session 2: Pt received seated in recliner,  agreeable to participate in therapy. Pt ambulated 100' to rehab gym w/ RW and MinGuard A. Completed 8' on Nustep L4 LE only for reciprocal coordination and BLE strengthening. For improved motor control pt concentrated on staying in midrange on Nustep. Standing exercises w/ 3# ankle weights w/ emphasis on slow, controlled movements hip flexion, hip abduction, heel raises x10 each. 2x1' standing on balance zone training balance beam w/ 1UE and 0 UE support, minGuard to MinA to stand for improved ankle strategies. Pt propelled w/c 150' x2 through hospital hallway (controlled environment) and carpeted surface with obstacles (simulated home environment) w/ overall supervision and intermittent MinA to avoid obstacles. Pt ambulated 15' to bathroom and completed toilet transfer w/ MinGuard. Pt left seated on commode to initiate bowel movement, NT aware of pt's position.    Session 3: Pt received supine in bed, agreeable to participate in therapy. Moved supine>sit w/ SBA, maintained back precautions independently. Ambulated 150' to rehab gym w/ RW and MinGuard for safety. Completed 10' on UBE L2 while maintaining 40 RPM for general cardiovascular endurance. For improved motor control pt stepped forward/backward with min resistance from therapist. Maintained standing with hands on therapist shoulders, therapist giving min excursion to challenge dynamic balance first with feet even then 1' each with feet in modified plantigrade. Practiced stair negotiation, pt able to step up 8" step backwards w/ MinGuard. 8" curb step unavailable for lifting walker up, so practiced lifting walker up onto 5.5" curb. Educated pt on making sure to have all four points of contact of  RW on curb before putting weight through it, and using help from wife to steady RW and make sure it is on curb. Pt ambulated 100' back to room w/ close SBA-CGA. Pt left seated in recliner w/ all needs within reach.   Therapy Documentation Precautions:   Precautions Precautions: Fall, Back Precaution Booklet Issued: No Precaution Comments: Reviewed back precautions Restrictions Weight Bearing Restrictions: No Pain: Pain Assessment Pain Assessment: 0-10 Pain Score: 2  Pain Type: Surgical pain Pain Location: Back Pain Descriptors / Indicators: Aching Pain Frequency: Intermittent Pain Onset: On-going Pain Intervention(s): Medication (See eMAR) Multiple Pain Sites: No  See FIM for current functional status  Therapy/Group: Individual Therapy  Rada Hay  Rada Hay, PT, DPT 07/03/2014, 7:44 AM

## 2014-07-03 NOTE — Plan of Care (Signed)
Problem: RH BOWEL ELIMINATION Goal: RH STG MANAGE BOWEL WITH ASSISTANCE STG Manage Bowel with Assistance. Mod I  Outcome: Progressing Goal: RH STG MANAGE BOWEL W/MEDICATION W/ASSISTANCE STG Manage Bowel with Medication withmin Assistance.  Outcome: Progressing  Problem: RH SAFETY Goal: RH STG ADHERE TO SAFETY PRECAUTIONS W/ASSISTANCE/DEVICE STG Adhere to Safety Precautions With Assistance/Device. supervision  Outcome: Progressing  Problem: RH PAIN MANAGEMENT Goal: RH STG PAIN MANAGED AT OR BELOW PT'S PAIN GOAL <5  Outcome: Progressing

## 2014-07-03 NOTE — Progress Notes (Signed)
Occupational Therapy Session Note  Patient Details  Name: Jeremiah Owens MRN: [id number] Date of Birth: January 18, 1930  Today's Date: 07/03/2014 OT Individual Time: 1100-1200 OT Individual Time Calculation (min): 60 min    Short Term Goals: Week 2:  OT Short Term Goal 1 (Week 2): STG=LTG due to anticipated discharge in 7 days.  Skilled Therapeutic Interventions/Progress Updates:    Pt fully dressed resting in recliner upon arrival. Pt declined bathing and dressing this morning.  Pt amb with RW to ADL apartment and then to therapy gym.  Pt engaged in dynamic standing tasks and functional amb with RW for therapeutic tasks and home mgmt tasks. Pt completed all tasks with steady A.  Pt required multiple rest breaks throughout session.  Pt amb with RW from therapy gym to room and returned to recliner at end of session.   Therapy Documentation Precautions:  Precautions Precautions: Fall, Back Precaution Booklet Issued: No Precaution Comments: Reviewed back precautions Restrictions Weight Bearing Restrictions: No  Pain: Pain Assessment Pain Assessment: 0-10 Pain Score: 6  Pain Type: Surgical pain Pain Location: Back Pain Orientation: Upper;Medial Pain Descriptors / Indicators: Aching Pain Frequency: Intermittent Pain Onset: On-going Patients Stated Pain Goal: 2 Pain Intervention(s): RN made aware Multiple Pain Sites: No  See FIM for current functional status  Therapy/Group: Individual Therapy  Leroy Libman 07/03/2014, 12:11 PM

## 2014-07-03 NOTE — Plan of Care (Signed)
Problem: RH BOWEL ELIMINATION Goal: RH STG MANAGE BOWEL WITH ASSISTANCE STG Manage Bowel with Assistance. Mod I  Outcome: Progressing Goal: RH STG MANAGE BOWEL W/MEDICATION W/ASSISTANCE STG Manage Bowel with Medication withmin Assistance.  Outcome: Progressing  Problem: RH SKIN INTEGRITY Goal: RH STG SKIN FREE OF INFECTION/BREAKDOWN No new skin breakdown/infection while on rehab with Min A of caregiver  Outcome: Progressing Goal: RH STG ABLE TO PERFORM INCISION/WOUND CARE W/ASSISTANCE STG Able To Perform Incision/Wound Care With total assist of caregiver.  Outcome: Progressing  Problem: RH SAFETY Goal: RH STG ADHERE TO SAFETY PRECAUTIONS W/ASSISTANCE/DEVICE STG Adhere to Safety Precautions With Assistance/Device. supervision  Outcome: Progressing  Problem: RH PAIN MANAGEMENT Goal: RH STG PAIN MANAGED AT OR BELOW PT'S PAIN GOAL <5  Outcome: Progressing

## 2014-07-03 NOTE — Telephone Encounter (Signed)
s.w. pt wife and r/s missed appt....done...pt ok and aware of d.t

## 2014-07-03 NOTE — Progress Notes (Signed)
CHART NOTE I spoke by phone to [name] today. I informed him about the results of the bone marrow biopsy and aspirate. The findings are consistent with multiple myeloma. The patient is currently undergoing physical therapy and rehabilitation and progressing nicely. I will arrange for him a follow-up appointment at the [facility] for detailed discussion of his treatment options after discharge from the rehabilitation center. Thank you for taking good care of [name]. Please call if you have any questions.

## 2014-07-04 ENCOUNTER — Encounter (HOSPITAL_COMMUNITY): Payer: Medicare Other | Admitting: Occupational Therapy

## 2014-07-04 ENCOUNTER — Inpatient Hospital Stay (HOSPITAL_COMMUNITY): Payer: Medicare Other | Admitting: *Deleted

## 2014-07-04 ENCOUNTER — Inpatient Hospital Stay (HOSPITAL_COMMUNITY): Payer: Medicare Other

## 2014-07-04 LAB — CBC
HCT: 31 % — ABNORMAL LOW (ref 39.0–52.0)
Hemoglobin: 10.3 g/dL — ABNORMAL LOW (ref 13.0–17.0)
MCH: 31.9 pg (ref 26.0–34.0)
MCHC: 33.2 g/dL (ref 30.0–36.0)
MCV: 96 fL (ref 78.0–100.0)
Platelets: 201 10*3/uL (ref 150–400)
RBC: 3.23 MIL/uL — ABNORMAL LOW (ref 4.22–5.81)
RDW: 13.8 % (ref 11.5–15.5)
WBC: 4.4 10*3/uL (ref 4.0–10.5)

## 2014-07-04 LAB — BASIC METABOLIC PANEL
ANION GAP: 9 (ref 5–15)
BUN: 18 mg/dL (ref 6–23)
CALCIUM: 8.9 mg/dL (ref 8.4–10.5)
CO2: 27 mEq/L (ref 19–32)
CREATININE: 0.9 mg/dL (ref 0.50–1.35)
Chloride: 97 mEq/L (ref 96–112)
GFR calc Af Amer: 89 mL/min — ABNORMAL LOW (ref >90)
GFR, EST NON AFRICAN AMERICAN: 77 mL/min — AB (ref >90)
Glucose, Bld: 91 mg/dL (ref 70–99)
Potassium: 4.1 mEq/L (ref 3.7–5.3)
Sodium: 133 mEq/L — ABNORMAL LOW (ref 137–147)

## 2014-07-04 NOTE — Progress Notes (Signed)
Social Work Patient ID: Jeremiah Owens, male   DOB: 08-28-1929, 80 y.o.   MRN: [id number]   Lennart Pall, LCSW Social Worker Signed  Patient Care Conference 07/04/2014  4:13 PM    Expand All Collapse All   Inpatient RehabilitationTeam Conference and Plan of Care Update Date: 07/04/2014   Time: 2:00 PM     Patient Name: Jeremiah Owens       Medical Record Number: [id number]  Date of Birth: [birth year] Sex: Male         Room/Bed: 4M01C/4M01C-01 Payor Info: Payor: MEDICARE / Plan: MEDICARE PART A AND B / Product Type: *No Product type* /    Admitting Diagnosis: t5 cord compression sp fusion   Admit Date/Time:  06/20/2014  5:56 PM Admission Comments: No comment available   Primary Diagnosis:  <principal problem not specified> Principal Problem: <principal problem not specified>    Patient Active Problem List     Diagnosis  Date Noted   .  Multiple myeloma     .  Paraplegia  06/21/2014   .  Postoperative anemia due to acute blood loss  06/21/2014   .  Neoplasm of thoracic spine  06/20/2014   .  Spine metastasis  06/17/2014   .  Thoracic spine tumor  06/17/2014   .  Metastatic bone cancer  06/15/2014     Expected Discharge Date: Expected Discharge Date: 07/07/14  Team Members Present: Physician leading conference: Dr. [name] Social Worker Present: Lennart Pall, LCSW Nurse Present: Rayetta Pigg, RN PT Present: Canary Brim, Cecille Rubin, PT OT Present: Willeen Cass, OT;Patricia Lissa Hoard, OT PPS Coordinator present : Daiva Nakayama, RN, CRRN        Current Status/Progress  Goal  Weekly Team Focus   Medical     improving mobility, still some balance issues   see prior,  wound care, pain, onc follow up   Bowel/Bladder     Continent to bowel and bladder.  To continue continent to B&B. To toilitting Q 2 hrs.  Monitore bladder and bladder Q shift.    Swallow/Nutrition/ Hydration               ADL's     Up to steady A for LB bathing and dressing, S to min A functional ambulation  and transfers, set-up A for UB ADLs  Mod I dynamic standing balance, transfers, and grooming; supervision for lower body dressing, toileting and meal prep  Functional ambulation, ADL retraining, AE education, activity tolerance/endurance   Mobility     mod (I) w/c level, MinGuard-MinA for ambulation up to 100-150', MinA for curb step negotiation (has 8" step to enter home)   mod (I) overall, supervision for car transfer, stair negotiation  dynamic balance, dynamic gait, endurance    Communication               Safety/Cognition/ Behavioral Observations              Pain     Pain under control with Q 4 Vicodin 1-2 tabs PRN  To keep pain level less than 3. by assessing pain Q 2 hrs.  To keep pain levels under 3,   Skin     Incision to back open to air.  No new skin breakdown  Assess skin Q shift.    Rehab Goals Patient on target to meet rehab goals: Yes *See Care Plan and progress notes for long and short-term goals.    Barriers to Discharge:  balance, stamina  Possible Resolutions to Barriers:   orthotics, adaptive equipment     Discharge Planning/Teaching Needs:   home with wife who can provide 24/7 assistance        Team Discussion:    On track for d/c on Friday at mod i goals overall.  Discussion of foot "slapping" on ground - MD ok trial of "toe up" support.     Revisions to Treatment Plan:    None    Continued Need for Acute Rehabilitation Level of Care: The patient requires daily medical management by a physician with specialized training in physical medicine and rehabilitation for the following conditions: Daily direction of a multidisciplinary physical rehabilitation program to ensure safe treatment while eliciting the highest outcome that is of practical value to the patient.: Yes Daily medical management of patient stability for increased activity during participation in an intensive rehabilitation regime.: Yes Daily analysis of laboratory values and/or radiology  reports with any subsequent need for medication adjustment of medical intervention for : Post surgical problems;Neurological problems  Nainoa Woldt 07/04/2014, 4:13 PM                 Lennart Pall, LCSW Social Worker Signed  Patient Care Conference 06/28/2014 11:26 AM    Expand All Collapse All   Inpatient RehabilitationTeam Conference and Plan of Care Update Date: 06/27/2014   Time: 2:05 PM     Patient Name: Jeremiah Owens       Medical Record Number: [id number]  Date of Birth: Feb 19, 1930 Sex: Male         Room/Bed: 4M01C/4M01C-01 Payor Info: Payor: MEDICARE / Plan: MEDICARE PART A AND B / Product Type: *No Product type* /    Admitting Diagnosis: t5 cord compression sp fusion   Admit Date/Time:  06/20/2014  5:56 PM Admission Comments: No comment available   Primary Diagnosis:  <principal problem not specified> Principal Problem: <principal problem not specified>    Patient Active Problem List     Diagnosis  Date Noted   .  Multiple myeloma     .  Paraplegia  06/21/2014   .  Postoperative anemia due to acute blood loss  06/21/2014   .  Neoplasm of thoracic spine  06/20/2014   .  Spine metastasis  06/17/2014   .  Thoracic spine tumor  06/17/2014   .  Metastatic bone cancer  06/15/2014     Expected Discharge Date: Expected Discharge Date: 07/07/14  Team Members Present: Physician leading conference: Dr. [name] Social Worker Present: Lennart Pall, LCSW Nurse Present: Elliot Cousin, RN PT Present: Canary Brim, Lorriane Shire, PT OT Present: Salome Spotted, OT;Jennifer Tamala Julian, OT;Patricia Lissa Hoard, OT PPS Coordinator present : Daiva Nakayama, RN, CRRN        Current Status/Progress  Goal  Weekly Team Focus   Medical     multiple myeolma to the spin. work up and treatment plan in progress. pain controlled. bowel and bladder  improve activity tolerance and balance   pain control, onc work up   Bowel/Bladder     Cont of bowel and bladder, LBM 11/23   Manage bowel and bladder  with MOD I   remain cont of bowel and bladder   Swallow/Nutrition/ Hydration               ADL's     Overall Min A for B & D, toileting and transfers   Mod I dynamic standing balance, transfers, and grooming; supervision for lower body dressing, toileting and meal prep  Dynamic  standing balance, endurance, transfers, AE use to maintain back precautions   Mobility     Supervision for w/c propulsion short distances, MinA for ambulation up to 50', limited by endurance, balance, ataxic gait  mod (I) overall, supervision for car transfer, stair negotiation  gait training, standing balance, endurance    Communication               Safety/Cognition/ Behavioral Observations              Pain     Pain level between 5-7, Vicodin 1-2 tabs given PRN with relief   Pain level less than 4  Pain relief with PRN pain meds to less than 4   Skin     Incision to back with honeycomb dressing clean dry and intact   No new skin breakdown  Assess skin q shift for no new breakdown     Rehab Goals Patient on target to meet rehab goals: Yes *See Care Plan and progress notes for long and short-term goals.    Barriers to Discharge:  pain, neuro deficits     Possible Resolutions to Barriers:   pain rx. NMR     Discharge Planning/Teaching Needs:   home with wife who can provide 24/7 assistance        Team Discussion:    Medically stable with his multiple myeloma.  Very motivated and working hard in tx.  Slow and gradual improvement but still with ataxic gait.  Goals @ mod I except for supervision stairs and LB ADLs.  Wife very involved.   Revisions to Treatment Plan:    None    Continued Need for Acute Rehabilitation Level of Care: The patient requires daily medical management by a physician with specialized training in physical medicine and rehabilitation for the following conditions: Daily direction of a multidisciplinary physical rehabilitation program to ensure safe treatment while eliciting the  highest outcome that is of practical value to the patient.: Yes Daily medical management of patient stability for increased activity during participation in an intensive rehabilitation regime.: Yes Daily analysis of laboratory values and/or radiology reports with any subsequent need for medication adjustment of medical intervention for : Neurological problems;Post surgical problems  Delvina Mizzell 06/28/2014, 11:26 AM                 Lennart Pall, [place] Worker Signed  Patient Care Conference 07/04/2014  4:13 PM    Expand All Collapse All   Inpatient RehabilitationTeam Conference and Plan of Care Update Date: 07/04/2014   Time: 2:00 PM     Patient Name: Jeremiah Owens       Medical Record Number: [id number]  Date of Birth: 12-Jun-1930 Sex: Male         Room/Bed: 4M01C/4M01C-01 Payor Info: Payor: MEDICARE / Plan: MEDICARE PART A AND B / Product Type: *No Product type* /    Admitting Diagnosis: t5 cord compression sp fusion   Admit Date/Time:  06/20/2014  5:56 PM Admission Comments: No comment available   Primary Diagnosis:  <principal problem not specified> Principal Problem: <principal problem not specified>    Patient Active Problem List     Diagnosis  Date Noted   .  Multiple myeloma     .  Paraplegia  06/21/2014   .  Postoperative anemia due to acute blood loss  06/21/2014   .  Neoplasm of thoracic spine  06/20/2014   .  Spine metastasis  06/17/2014   .  Thoracic spine tumor  06/17/2014   .  Metastatic bone cancer  06/15/2014     Expected Discharge Date: Expected Discharge Date: 07/07/14  Team Members Present: Physician leading conference: Dr. [name] Social Worker Present: Lennart Pall, LCSW Nurse Present: Rayetta Pigg, RN PT Present: Canary Brim, Cecille Rubin, PT OT Present: Willeen Cass, OT;Patricia Lissa Hoard, OT PPS Coordinator present : Daiva Nakayama, RN, CRRN        Current Status/Progress  Goal  Weekly Team Focus   Medical     improving mobility, still  some balance issues   see prior,  wound care, pain, onc follow up   Bowel/Bladder     Continent to bowel and bladder.  To continue continent to B&B. To toilitting Q 2 hrs.  Monitore bladder and bladder Q shift.    Swallow/Nutrition/ Hydration               ADL's     Up to steady A for LB bathing and dressing, S to min A functional ambulation and transfers, set-up A for UB ADLs  Mod I dynamic standing balance, transfers, and grooming; supervision for lower body dressing, toileting and meal prep  Functional ambulation, ADL retraining, AE education, activity tolerance/endurance   Mobility     mod (I) w/c level, MinGuard-MinA for ambulation up to 100-150', MinA for curb step negotiation (has 8" step to enter home)   mod (I) overall, supervision for car transfer, stair negotiation  dynamic balance, dynamic gait, endurance    Communication               Safety/Cognition/ Behavioral Observations              Pain     Pain under control with Q 4 Vicodin 1-2 tabs PRN  To keep pain level less than 3. by assessing pain Q 2 hrs.  To keep pain levels under 3,   Skin     Incision to back open to air.  No new skin breakdown  Assess skin Q shift.    Rehab Goals Patient on target to meet rehab goals: Yes *See Care Plan and progress notes for long and short-term goals.    Barriers to Discharge:  balance, stamina     Possible Resolutions to Barriers:   orthotics, adaptive equipment     Discharge Planning/Teaching Needs:   home with wife who can provide 24/7 assistance        Team Discussion:    On track for d/c on Friday at mod i goals overall.  Discussion of foot "slapping" on ground - MD ok trial of "toe up" support.     Revisions to Treatment Plan:    None    Continued Need for Acute Rehabilitation Level of Care: The patient requires daily medical management by a physician with specialized training in physical medicine and rehabilitation for the following conditions: Daily direction of a  multidisciplinary physical rehabilitation program to ensure safe treatment while eliciting the highest outcome that is of practical value to the patient.: Yes Daily medical management of patient stability for increased activity during participation in an intensive rehabilitation regime.: Yes Daily analysis of laboratory values and/or radiology reports with any subsequent need for medication adjustment of medical intervention for : Post surgical problems;Neurological problems  Emeree Mahler 07/04/2014, 4:13 PM                 Lennart Pall, LCSW Social Worker Signed  Patient Care Conference 06/28/2014 11:26 AM    Expand All Collapse All   Inpatient RehabilitationTeam Conference  and Plan of Care Update Date: 06/27/2014   Time: 2:05 PM     Patient Name: Jeremiah Owens       Medical Record Number: [id number]  Date of Birth: [birth year] Sex: Male         Room/Bed: 4M01C/4M01C-01 Payor Info: Payor: MEDICARE / Plan: MEDICARE PART A AND B / Product Type: *No Product type* /    Admitting Diagnosis: t5 cord compression sp fusion   Admit Date/Time:  06/20/2014  5:56 PM Admission Comments: No comment available   Primary Diagnosis:  <principal problem not specified> Principal Problem: <principal problem not specified>    Patient Active Problem List     Diagnosis  Date Noted   .  Multiple myeloma     .  Paraplegia  06/21/2014   .  Postoperative anemia due to acute blood loss  06/21/2014   .  Neoplasm of thoracic spine  06/20/2014   .  Spine metastasis  06/17/2014   .  Thoracic spine tumor  06/17/2014   .  Metastatic bone cancer  06/15/2014     Expected Discharge Date: Expected Discharge Date: 07/07/14  Team Members Present: Physician leading conference: Dr. [name] Social Worker Present: Lennart Pall, LCSW Nurse Present: Elliot Cousin, RN PT Present: Canary Brim, Lorriane Shire, PT OT Present: Salome Spotted, OT;Jennifer Tamala Julian, OT;Patricia Lissa Hoard, OT PPS Coordinator present : Daiva Nakayama, RN, CRRN        Current Status/Progress  Goal  Weekly Team Focus   Medical     multiple myeolma to the spin. work up and treatment plan in progress. pain controlled. bowel and bladder  improve activity tolerance and balance   pain control, onc work up   Bowel/Bladder     Cont of bowel and bladder, LBM 11/23   Manage bowel and bladder with MOD I   remain cont of bowel and bladder   Swallow/Nutrition/ Hydration               ADL's     Overall Min A for B & D, toileting and transfers   Mod I dynamic standing balance, transfers, and grooming; supervision for lower body dressing, toileting and meal prep  Dynamic standing balance, endurance, transfers, AE use to maintain back precautions   Mobility     Supervision for w/c propulsion short distances, MinA for ambulation up to 50', limited by endurance, balance, ataxic gait  mod (I) overall, supervision for car transfer, stair negotiation  gait training, standing balance, endurance    Communication               Safety/Cognition/ Behavioral Observations              Pain     Pain level between 5-7, Vicodin 1-2 tabs given PRN with relief   Pain level less than 4  Pain relief with PRN pain meds to less than 4   Skin     Incision to back with honeycomb dressing clean dry and intact   No new skin breakdown  Assess skin q shift for no new breakdown     Rehab Goals Patient on target to meet rehab goals: Yes *See Care Plan and progress notes for long and short-term goals.    Barriers to Discharge:  pain, neuro deficits     Possible Resolutions to Barriers:   pain rx. NMR     Discharge Planning/Teaching Needs:   home with wife who can provide 24/7 assistance  Team Discussion:    Medically stable with his multiple myeloma.  Very motivated and working hard in tx.  Slow and gradual improvement but still with ataxic gait.  Goals @ mod I except for supervision stairs and LB ADLs.  Wife very involved.   Revisions to Treatment Plan:     None    Continued Need for Acute Rehabilitation Level of Care: The patient requires daily medical management by a physician with specialized training in physical medicine and rehabilitation for the following conditions: Daily direction of a multidisciplinary physical rehabilitation program to ensure safe treatment while eliciting the highest outcome that is of practical value to the patient.: Yes Daily medical management of patient stability for increased activity during participation in an intensive rehabilitation regime.: Yes Daily analysis of laboratory values and/or radiology reports with any subsequent need for medication adjustment of medical intervention for : Neurological problems;Post surgical problems  Ladashia Demarinis 06/28/2014, 11:26 AM

## 2014-07-04 NOTE — Progress Notes (Signed)
Occupational Therapy Session Note  Patient Details  Name: Jeremiah Owens MRN: [id number] Date of Birth: 15-Jan-1930  Today's Date: 07/04/2014 OT Individual Time: 1000-1100 OT Calculated Individual Time (min): 60 min    Short Term Goals: Week 1:  OT Short Term Goal 1 (Week 1): Pt will complete toilet transfer at min assist level OT Short Term Goal 1 - Progress (Week 1): Met OT Short Term Goal 2 (Week 1): Pt will complete toilet task with min assist and min cues for adherence to precuations OT Short Term Goal 2 - Progress (Week 1): Met OT Short Term Goal 3 (Week 1): Pt will complete LB dressing with mod assist and use of AE OT Short Term Goal 3 - Progress (Week 1): Met OT Short Term Goal 4 (Week 1): Pt will complete self-care task in standing for 1 min with min assist for balance OT Short Term Goal 4 - Progress (Week 1): Met Week 2:  OT Short Term Goal 1 (Week 2): STG=LTG due to anticipated discharge in 7 days.  Skilled Therapeutic Interventions/Progress Updates:  Skilled OT session focusing on ADL retraining, functional ambulation, safety awareness, AE use, and activity tolerance/endurance. Pt supine in bed and awake when arriving, reporting 3/10 pain in upper back. Pt transferred to EOB via log rolling and then ambulated to bathroom and transferred to TTB. Pt completed UB/LB bathing, using reacher and taking more than reasonable amount of time. Pt ambulated to EOB and completed UB/LB dressing, using reacher for pants and underwear. Pt ambulated to sink and completed tooth brushing and hair brushing. Discussed pt d/c planning, home set-up, energy conservation strategies, and use of AE in kitchen and bathroom. Pt reporting feeling anxious about what going home would look like, as he was unsure  what his daily routine would be. Provided psychosocial support and discussed how pt could plan for and adapt to these changes. Pt requiring frequent rest breaks throughout session and steady A during  ambulation and transfers. Pt followed back precautions throughout session. Pt seated in w/c with all needs nearby when leaving.  Therapy Documentation Precautions:  Precautions Precautions: Fall, Back Precaution Booklet Issued: No Precaution Comments: Reviewed back precautions Restrictions Weight Bearing Restrictions: No  See FIM for current functional status  Therapy/Group: Individual Therapy  Jeremiah Owens 07/04/2014, 10:28 AM

## 2014-07-04 NOTE — Patient Care Conference (Signed)
Inpatient RehabilitationTeam Conference and Plan of Care Update Date: 07/04/2014   Time: 2:00 PM    Patient Name: Jeremiah Owens      Medical Record Number: [id number]  Date of Birth: May 09, 1930 Sex: Male         Room/Bed: 4M01C/4M01C-01 Payor Info: Payor: MEDICARE / Plan: MEDICARE PART A AND B / Product Type: *No Product type* /    Admitting Diagnosis: t5 cord compression sp fusion  Admit Date/Time:  06/20/2014  5:56 PM Admission Comments: No comment available   Primary Diagnosis:  <principal problem not specified> Principal Problem: <principal problem not specified>  Patient Active Problem List   Diagnosis Date Noted  . Multiple myeloma   . Paraplegia 06/21/2014  . Postoperative anemia due to acute blood loss 06/21/2014  . 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 Spine metastasis 06/17/2014  . Thoracic spine tumor 06/17/2014  . Metastatic bone cancer 06/15/2014    Expected Discharge Date: Expected Discharge Date: 07/07/14  Team Members Present: Physician leading conference: Dr. [name] Social Worker Present: Lennart Pall, LCSW Nurse Present: Rayetta Pigg, RN PT Present: Canary Brim, Cecille Rubin, PT OT Present: Willeen Cass, OT;Patricia Lissa Hoard, OT PPS Coordinator present : Daiva Nakayama, RN, CRRN     Current Status/Progress Goal Weekly Team Focus  Medical   improving mobility, still some balance issues  see prior,  wound care, pain, onc follow up   Bowel/Bladder   Continent to bowel and bladder.  To continue continent to B&B. To toilitting Q 2 hrs.  Monitore bladder and bladder Q shift.   Swallow/Nutrition/ Hydration             ADL's   Up to steady A for LB bathing and dressing, S to min A functional ambulation and transfers, set-up A for UB ADLs  Mod I dynamic standing balance, transfers, and grooming; supervision for lower body dressing, toileting and meal prep  Functional ambulation, ADL retraining, AE education, activity tolerance/endurance   Mobility   mod (I) w/c level, MinGuard-MinA for ambulation up to 100-150', MinA for curb step negotiation (has 8" step to enter home)  mod (I) overall, supervision for car transfer, stair negotiation  dynamic balance, dynamic gait, endurance   Communication             Safety/Cognition/ Behavioral Observations            Pain   Pain under control with Q 4 Vicodin 1-2 tabs PRN  To keep pain level less than 3. by assessing pain Q 2 hrs.  To keep pain levels under 3,   Skin   Incision to back open to air.  No new skin breakdown  Assess skin Q shift.    Rehab Goals Patient on target to meet rehab goals: Yes *See Care Plan and progress notes for long and short-term goals.  Barriers to Discharge: balance, stamina    Possible Resolutions to Barriers:  orthotics, adaptive equipment    Discharge Planning/Teaching Needs:  home with wife who can provide 24/7 assistance      Team Discussion:  On track for d/c on Friday at mod i goals overall.  Discussion of foot "slapping" on ground - MD ok trial of "toe up" support.    Revisions to Treatment Plan:  None   Continued Need for Acute Rehabilitation Level of Care: The patient requires daily medical management by a physician with specialized training in physical medicine and rehabilitation for the following conditions: Daily direction of a multidisciplinary physical rehabilitation program  to ensure safe treatment while eliciting the highest outcome that is of practical value to the patient.: Yes Daily medical management of patient stability for increased activity during participation in an intensive rehabilitation regime.: Yes Daily analysis of laboratory values and/or radiology reports with any subsequent need for medication adjustment of medical intervention for : Post surgical problems;Neurological problems  Guiselle Mian 07/04/2014, 4:13 PM

## 2014-07-04 NOTE — Plan of Care (Signed)
Problem: RH BOWEL ELIMINATION Goal: RH STG MANAGE BOWEL WITH ASSISTANCE STG Manage Bowel with Assistance. Mod I  Outcome: Progressing Goal: RH STG MANAGE BOWEL W/MEDICATION W/ASSISTANCE STG Manage Bowel with Medication withmin Assistance.  Outcome: Progressing  Problem: RH SKIN INTEGRITY Goal: RH STG SKIN FREE OF INFECTION/BREAKDOWN No new skin breakdown/infection while on rehab with Min A of caregiver  Outcome: Progressing Goal: RH STG ABLE TO PERFORM INCISION/WOUND CARE W/ASSISTANCE STG Able To Perform Incision/Wound Care With total assist of caregiver.  Outcome: Progressing  Problem: RH SAFETY Goal: RH STG ADHERE TO SAFETY PRECAUTIONS W/ASSISTANCE/DEVICE STG Adhere to Safety Precautions With Assistance/Device. supervision  Outcome: Progressing

## 2014-07-04 NOTE — Progress Notes (Signed)
Physical Therapy Session Note  Patient Details  Name: Jeremiah Owens MRN: [id number] Date of Birth: 02/20/1930  Today's Date: 07/04/2014 PT Individual Time: 1300-1400 PT Individual Time Calculation (min): 60 min   Short Term Goals: Week 2:  PT Short Term Goal 1 (Week 2): STGs = LTgs due to ELOS  Skilled Therapeutic Interventions/Progress Updates:    Pt found sitting up in room. Session focused on gait training and functional mobility. Pt self-propelled w/c 100' using BUE and supervision room>rehab gym. Pt performed 2 bouts of ~200' gait training with lite gait on treadmill with min verbal cues for heel strike, increased step length, and improved posture; speed progressed during first bout from .4 to .6 mph and pt sustained gait for 4 minutes, second bout ~2 minutes . Pt ambulated in hospital hallway (rehab gym>room) with RW and min guard. Pt left sitting up in room and all needs in reach; pt mentioned getting up in room without assistance, RN made aware.  Therapy Documentation Precautions:  Precautions Precautions: Fall, Back Precaution Booklet Issued: No Precaution Comments: Reviewed back precautions Restrictions Weight Bearing Restrictions: No Vital Signs: Therapy Vitals Temp: 98 F (36.7 C) Temp Source: Oral Pulse Rate: 80 Resp: 18 BP: (!) 113/48 mmHg Patient Position (if appropriate): Sitting Oxygen Therapy SpO2: 100 % O2 Device: Not Delivered Pain: Pain Assessment Pain Assessment: 0-10 Pain Score: 2  Pain Type: Surgical pain Pain Location: Back Pain Orientation: Upper Pain Descriptors / Indicators: Aching Pain Frequency: Intermittent Pain Onset: Gradual Patients Stated Pain Goal: 2 Pain Intervention(s): Medication (See eMAR);Repositioned Multiple Pain Sites: No Locomotion : Ambulation Ambulation/Gait Assistance: 4: Min guard;5: Supervision  See FIM for current functional status  Therapy/Group: Individual Therapy  Jesicca Dipierro 07/04/2014, 2:51 PM

## 2014-07-04 NOTE — Progress Notes (Signed)
Social Work Patient ID: Jeremiah Owens, male   DOB: [birth year], 80 y.o.   MRN: [id number]   Met with pt following team conference.  Feels he will be ready for d/c end of week and that he is making good progress.  Discussed DME and HH f/u needs but will also review with wife when here.  Continue to follow. No concerns.  Giorgia Wahler, LCSW

## 2014-07-04 NOTE — Progress Notes (Signed)
Physical Therapy Session Note  Patient Details  Name: Jeremiah Owens MRN: [id number] Date of Birth: 28-May-1930  Today's Date: 07/04/2014 PT Individual Time: 0800-0900 PT Individual Time Calculation (min): 60 min   Session 2 Time: 3762-8315 Time Calculation (min): 60 min  Short Term Goals: Week 2:  PT Short Term Goal 1 (Week 2): STGs = LTgs due to ELOS  Skilled Therapeutic Interventions/Progress Updates:    Session 1: Pt received seated in w/c, agreeable to participate in therapy. Pt stood to pull pants up w/ steadying assist. Ambulated 100' to rehab gym w/ RW and MinGuardA for safety. In parallel bars worked on pre-gait activities to improve coordination and motor control, steps to target and cone taps w/ emphasis on soft tap x15 ea LE. [place] ex's for improved LE strength and standing balance, x10 each w/ 3# ankle weights seated LAQ, standing hip abduction, HS curls, half-squats, and heel/toe raises. Ambulated to ADL apartment, worked on bed mobility on standard mattress. Pt able to recall precautions independently and teach back reasons for using log roll technique. Pt rolled L/R and moved supine<>sit w/ SBA, still benefits from supervision to ensure precautions are maintained during mobility.  Pt ambulated back to room w/ RW and close S-MinGuard, noted improved gait mechanics vs beginning of session, decreased scissoring and decreased foot drag on L. Pt moved sit>supine w/ SBA, left supine in bed w/ all needs within reach.  Session 2: Pt received seated in w/c, agreeable to participate in therapy. Propelled w/c 100' to rehab gym w/ mod (I). Nustep L4 LE only 10' for bilateral proprioceptive feedback, strengthening, general cardiovascular endurance. Pt ambulated 100' w/ RW and close S-MinGuard for safety. For improved ankle strategies and motor control pt w/ x10 each leg stepping forward onto foam, hands on therapist forearms w/ therapist giving pt min support to increase pt's own stabilization. Pt  also moved into modified plantigrade w/ front foot on foam and maintained for 1' each leg to increase muscle activation in ankles. On Biodex machine pt completed x3 limit of stability exercise on beginner level on static surface w/ scores ranging from 70-90%. Pt required min cueing to maintain back precautions during activity (attempted to arch back when shifting weight anteriorly. Pt propelled w/c back to room w/ mod (I), left seated in w/c w/ lunch set up and all needs within reach.    Therapy Documentation Precautions:  Precautions Precautions: Fall, Back Precaution Booklet Issued: No Precaution Comments: Reviewed back precautions Restrictions Weight Bearing Restrictions: No Pain: Not rated, felt some "pulling" in upper back with some ambulation due to reliance on UE  See FIM for current functional status  Therapy/Group: Individual Therapy  Rada Hay  Rada Hay, PT, DPT 07/04/2014, 7:48 AM

## 2014-07-04 NOTE — Progress Notes (Signed)
Orthopedic Tech Progress Note Patient Details:  Jeremiah Owens April 02, 1930 [id number] [facility] called for brace order. Spoke with Shemika to place order. Patient ID: Montavis Schubring, male   DOB: 02-Sep-1929, 80 y.o.   MRN: [id number]   Fenton Foy 07/04/2014, 2:33 PM

## 2014-07-04 NOTE — Plan of Care (Signed)
Problem: RH BOWEL ELIMINATION Goal: RH STG MANAGE BOWEL WITH ASSISTANCE STG Manage Bowel with Assistance. Mod I  Outcome: Progressing Goal: RH STG MANAGE BOWEL W/MEDICATION W/ASSISTANCE STG Manage Bowel with Medication withmin Assistance.  Outcome: Progressing  Problem: RH SKIN INTEGRITY Goal: RH STG SKIN FREE OF INFECTION/BREAKDOWN No new skin breakdown/infection while on rehab with Min A of caregiver  Outcome: Progressing Goal: RH STG ABLE TO PERFORM INCISION/WOUND CARE W/ASSISTANCE STG Able To Perform Incision/Wound Care With total assist of caregiver.  Outcome: Progressing  Problem: RH SAFETY Goal: RH STG ADHERE TO SAFETY PRECAUTIONS W/ASSISTANCE/DEVICE STG Adhere to Safety Precautions With Assistance/Device. supervision  Outcome: Progressing  Problem: RH PAIN MANAGEMENT Goal: RH STG PAIN MANAGED AT OR BELOW PT'S PAIN GOAL <5  Outcome: Progressing

## 2014-07-04 NOTE — Progress Notes (Signed)
[facility] PHYSICAL MEDICINE & REHABILITATION     PROGRESS NOTE    Subjective/Complaints: Productive day. Pain better. Discussed plan with onc. Has many questions about dc from here A  review of systems has been performed and if not noted above is otherwise negative.   Objective: Vital Signs: Blood pressure 117/51, pulse 69, temperature 98.4 F (36.9 C), temperature source Oral, resp. rate 17, weight 74.4 kg (164 lb 0.4 oz), SpO2 99 %. No results found.  Recent Labs  07/04/14 0430  WBC 4.4  HGB 10.3*  HCT 31.0*  PLT 201    Recent Labs  07/04/14 0430  NA 133*  K 4.1  CL 97  GLUCOSE 91  BUN 18  CREATININE 0.90  CALCIUM 8.9   CBG (last 3)  No results for input(s): GLUCAP in the last 72 hours.  Wt Readings from Last 3 Encounters:  06/28/14 74.4 kg (164 lb 0.4 oz)  06/17/14 73.3 kg (161 lb 9.6 oz)  06/15/14 74.844 kg (165 lb)    Physical Exam:  HENT: dentition good Head: Normocephalic.  Right Ear: External ear normal.  Left Ear: External ear normal.  Eyes: Conjunctivae and EOM are normal. Pupils are equal, round, and reactive to light. Right eye exhibits no discharge. Left eye exhibits no discharge.  Neck: Normal range of motion. Neck supple. No JVD present. No tracheal deviation present. No thyromegaly present.  Cardiovascular: Normal rate, regular rhythm and normal heart sounds.  Respiratory: Effort normal and breath sounds normal. No respiratory distress. He has no wheezes. He has no rales. He exhibits no tenderness.  GI: Soft. Bowel sounds are normal. He exhibits no distension. There is no tenderness. There is no rebound.  Musculoskeletal:  Head forward posture.  Mild pedal edema Lymphadenopathy:   He has no cervical adenopathy.  Neurological: He is alert and oriented to person, place, and time.  Follows commands. No cn findings. Left shoulder limited due to pain/ tightness. Otherwise UE 4/5. LE: HF 4-5. KE 4/5, ADF/APF 4/5. Decreased  LT/proprioception in both feet. No resting tone.  Skin: Skin is warm.  Back incision is clean and intact--sutures out  dry. No erythema Psychiatric: He has a normal mood and affect. His behavior is normal. Judgment and thought content normal  Assessment/Plan: 1. 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 Physiatrist is providing close team supervision and 24 hour management of active medical problems listed below. Physiatrist and rehab team continue to assess barriers to discharge/monitor patient progress toward functional and medical goals.    FIM: FIM - Bathing Bathing Steps Patient Completed: Chest, Left Arm, Buttocks, Right Arm, Front perineal area, Abdomen, Right upper leg, Left upper leg, Right lower leg (including foot), Left lower leg (including foot) Bathing: 5: Supervision: Safety issues/verbal cues  FIM - Upper Body Dressing/Undressing Upper body dressing/undressing steps patient completed: Thread/unthread right sleeve of pullover shirt/dresss, Thread/unthread left sleeve of pullover shirt/dress, Put head through opening of pull over shirt/dress, Pull shirt over trunk Upper body dressing/undressing: 5: Supervision: Safety issues/verbal cues FIM - Lower Body Dressing/Undressing Lower body dressing/undressing steps patient completed: Thread/unthread right underwear leg, Thread/unthread left underwear leg, Pull underwear up/down, Thread/unthread right pants leg, Pull pants up/down, Thread/unthread left pants leg, Don/Doff right sock, Don/Doff left sock Lower body dressing/undressing: 6: Assistive device (Comment)  FIM - Toileting Toileting steps completed by patient: Adjust clothing prior to toileting, Performs perineal  hygiene, Adjust clothing after toileting Toileting Assistive Devices: Grab bar or rail for  support Toileting: 5: Supervision: Safety issues/verbal cues  FIM - Air cabin crew Transfers Assistive Devices: Environmental consultant, Bedside commode, Product manager Transfers: 5-To toilet/BSC: Supervision (verbal cues/safety issues), 5-From toilet/BSC: Supervision (verbal cues/safety issues)  FIM - Control and instrumentation engineer Devices: Arm rests, Copy: 5: Supine > Sit: Supervision (verbal cues/safety issues), 5: Sit > Supine: Supervision (verbal cues/safety issues), 5: Bed > Chair or W/C: Supervision (verbal cues/safety issues), 5: Chair or W/C > Bed: Supervision (verbal cues/safety issues)  FIM - Locomotion: Wheelchair Distance: 150 Locomotion: Wheelchair: 2: Travels 50 - 149 ft with minimal assistance (Pt.>75%) FIM - Locomotion: Ambulation Locomotion: Ambulation Assistive Devices: Administrator Ambulation/Gait Assistance: 4: Min guard, 4: Min assist Locomotion: Ambulation: 2: Travels 50 - 149 ft with minimal assistance (Pt.>75%)  Comprehension Comprehension Mode: Auditory Comprehension: 6-Follows complex conversation/direction: With extra time/assistive device  Expression Expression Mode: Verbal Expression: 7-Expresses complex ideas: With no assist  Social Interaction Social Interaction: 7-Interacts appropriately with others - No medications needed.  Problem Solving Problem Solving: 5-Solves complex 90% of the time/cues < 10% of the time  Memory Memory: 7-Complete Independence: No helper  Medical Problem List and Plan: 1. Functional deficits secondary to multiple myeloma to thoracic spine status post decompression and fusion T4-7 06/18/2014  -sutures out   -reviewed post-operative images with patient so that he could see what was done. 2. DVT Prophylaxis/Anticoagulation: SCDs. 3. Pain Management: Hydrocodone and Robaxin as needed. Monitor with increased mobility  -good posture/ROM 4. Hypothyroidism. Synthroid 5. Neuropsych: This  patient is capable of making decisions on his own behalf. 6. Skin/Wound Care: routine skin checks 7. Fluids/Electrolytes/Nutrition: strict I and O's.   -labs normal today. 8.BPH/history of prostate cancer. Flomax 0.4 mg daily at bedtime. Emptying bladder without issues 9. Bowels: emptied over weekend 10. ABLA: 10.3 hgb 11. Onc: path + for MM.     -pt has appt scheduled for next week with Dr. Julien Nordmann   LOS (Days) 14 A FACE TO FACE EVALUATION WAS PERFORMED  Mlissa Tamayo T 07/04/2014 7:10 AM

## 2014-07-05 ENCOUNTER — Inpatient Hospital Stay (HOSPITAL_COMMUNITY): Payer: Medicare Other | Admitting: Physical Therapy

## 2014-07-05 ENCOUNTER — Encounter (HOSPITAL_COMMUNITY): Payer: Self-pay | Admitting: *Deleted

## 2014-07-05 ENCOUNTER — Inpatient Hospital Stay (HOSPITAL_COMMUNITY): Payer: Medicare Other

## 2014-07-05 NOTE — Progress Notes (Signed)
Occupational Therapy Session Note  Patient Details  Name: Jeremiah Owens MRN: [id number] Date of Birth: 06-18-1930  Today's Date: 07/05/2014 OT Individual Time: 1100-1200 OT Individual Time Calculation (min): 60 min    Short Term Goals: Week 2:  OT Short Term Goal 1 (Week 2): STG=LTG due to anticipated discharge in 7 days.  Skilled Therapeutic Interventions/Progress Updates:    Pt seen for 1:1 OT session with focus on functional transfers, activity tolerance, education, and standing balance. Pt received sitting in w/c declining bathing and dressing. Pt initiating discussion in regards d/c planning. Pt voicing concern with toilet transfer and wife providing supervision. Discussed goals for supervision with use of RW then practiced squat pivot transfer w/c<>drop arm BSC 3x at supervision level. Pt with good safety during transfer and discussed completing this type of toilet transfer at home at Mod I level. Notified CSW about need for drop arm BSC. Practiced retrieving items refrigerator>counter>table at supervision level from w/c level. Engaged in dynamic standing task of reaching into overhead cabinet to retrieve items at supervision level with min cues for safety and setup. Discussed importance of having wife assist in some cooking tasks at home and ways pt can assist wife during meal time to decrease burden on her. Pt agreeable to all advice from therapist. Pt returned to room and left with all needs in reach.   Therapy Documentation Precautions:  Precautions Precautions: Fall, Back Precaution Booklet Issued: No Precaution Comments: Reviewed back precautions Restrictions Weight Bearing Restrictions: No General:   Vital Signs:  Pain: Pain Assessment Pain Assessment: 0-10 Pain Score: 4  Pain Type: Surgical pain Pain Location: Back Pain Orientation: Lower Pain Descriptors / Indicators: Aching Pain Frequency: Intermittent Pain Onset: On-going Pain Intervention(s):  Rest;Repositioned Multiple Pain Sites: No  See FIM for current functional status  Therapy/Group: Individual Therapy  Duayne Cal 07/05/2014, 12:08 PM

## 2014-07-05 NOTE — Progress Notes (Signed)
Physical Therapy Session Note  Patient Details  Name: Jeremiah Owens MRN: [id number] Date of Birth: [birth year]  Today's Date: 07/05/2014 PT Individual Time: 1405-1505 PT Individual Time Calculation (min): 60 min   Short Term Goals: Week 2:  PT Short Term Goal 1 (Week 2): STGs = LTgs due to ELOS  Skilled Therapeutic Interventions/Progress Updates:    Pt received seated in w/c in treatment gym having just completed prior PT session. Pt agreeable to therapy. Per pt questions, provided education on Foot Up brace for L ankle dorsiflexion assist. Session focused on high level ambulation skills. Pt performed w/c mobility >150' in controlled and home environments with bilat LE's and mod I. In rehab apartment kitchen, pt performed sidestepping in bilat directions with rolling walker and min guard, mod cueing for sequencing with device with effective return demonstration. Gait x75' in controlled environments with rolling walker and supervision with the following gait deviations: limited lateral weight shift (bilaterally), minimal pelvic movement, decreased bilat foot clearance (secondary to limited hip/knee flexion and ankle dorsiflexion bilaterally), midfoot/forefoot strike. See below for detailed description of NMR and gait training interventions addressing said impairments. Session ended in pt room, where pt was left seated in w/c with all needs within reach.  Therapy Documentation Precautions:  Precautions Precautions: Fall, Back Precaution Booklet Issued: No Precaution Comments: Reviewed back precautions Restrictions Weight Bearing Restrictions: No Pain: Pain Assessment No complaint of pain during this session. NMR: Neuromuscular Facilitation: Lower Extremity;Activity to increase coordination;Activity to increase grading;Activity to increase lateral weight shifting;Activity to increase anterior-posterior weight shifting;Activity to increase motor control - Seated EOM on wedge to promote neutral  pelvic tilt, performed multiple sit<>stand transfers without UE support. Cueing focused on active sitting, transitional movements for motor control, anterior weight shifting. Tactile cueing at L ribcage, R posterior pelvis to emphasize erect trunk flexion. Focused on slow, controlled performance of select ranges of transfer to increase motor control in bilat LE's. - Standing with 3-finger hold at bilat parallel bars, pt performed bilat alternating toe taps on 4" step to increase lateral weight shifting, hip/knee flexion to improve bilat foot clearance during gait.  Gait training: Pt performed multiple short-distance ambulation trials at parallel bars with 1 lb ankle cuff weights for increased proprioceptive input in bilat LE's. Cueing focused on increased hip/knee flexion, heel strike. During turning, cueing focused on maintaining wider BOS and taking small steps rather than pivoting. In standing with single UE support at parallel bars, pt performed bilat toe raises x20 reps, x8 reps (to fatigue) to promote heel strike during gait.  See FIM for current functional status  Therapy/Group: Individual Therapy  [place]ble, Malva Cogan 07/05/2014, 4:14 PM

## 2014-07-05 NOTE — Progress Notes (Signed)
[place] [facility] PHYSICAL MEDICINE & REHABILITATION     PROGRESS NOTE    Subjective/Complaints: Continues to progress. Pleased with progress. Wants balance to be a little better. A  review of systems has been performed and if not noted above is otherwise negative.   Objective: Vital Signs: Blood pressure 127/58, pulse 65, temperature 98.4 F (36.9 C), temperature source Oral, resp. rate 20, weight 74.4 kg (164 lb 0.4 oz), SpO2 97 %. No results found.  Recent Labs  07/04/14 0430  WBC 4.4  HGB 10.3*  HCT 31.0*  PLT 201    Recent Labs  07/04/14 0430  NA 133*  K 4.1  CL 97  GLUCOSE 91  BUN 18  CREATININE 0.90  CALCIUM 8.9   CBG (last 3)  No results for input(s): GLUCAP in the last 72 hours.  Wt Readings from Last 3 Encounters:  06/28/14 74.4 kg (164 lb 0.4 oz)  06/17/14 73.3 kg (161 lb 9.6 oz)  06/15/14 74.844 kg (165 lb)    Physical Exam:  HENT: dentition good Head: Normocephalic.  Right Ear: External ear normal.  Left Ear: External ear normal.  Eyes: Conjunctivae and EOM are normal. Pupils are equal, round, and reactive to light. Right eye exhibits no discharge. Left eye exhibits no discharge.  Neck: Normal range of motion. Neck supple. No JVD present. No tracheal deviation present. No thyromegaly present.  Cardiovascular: Normal rate, regular rhythm and normal heart sounds.  Respiratory: Effort normal and breath sounds normal. No respiratory distress. He has no wheezes. He has no rales. He exhibits no tenderness.  GI: Soft. Bowel sounds are normal. He exhibits no distension. There is no tenderness. There is no rebound.  Musculoskeletal:  Head forward posture.  Mild pedal edema Lymphadenopathy:   He has no cervical adenopathy.  Neurological: He is alert and oriented to person, place, and time.  Follows commands. No cn findings. Left shoulder limited due to pain/ tightness. Otherwise UE 4/5. LE: HF 4-5. KE 4/5, ADF/APF 4/5. Decreased LT/proprioception in  both feet. No resting tone.  Skin: Skin is warm.  Back incision is clean and intact--sutures out  dry. No erythema Psychiatric: He has a normal mood and affect. His behavior is normal. Judgment and thought content normal  Assessment/Plan: 1. 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 Physiatrist is providing close team supervision and 24 hour management of active medical problems listed below. Physiatrist and rehab team continue to assess barriers to discharge/monitor patient progress toward functional and medical goals.  Family ed tomorrow  FIM: FIM - Bathing Bathing Steps Patient Completed: Chest, Left Arm, Buttocks, Right Arm, Front perineal area, Abdomen, Right upper leg, Left upper leg, Right lower leg (including foot), Left lower leg (including foot) Bathing: 5: Supervision: Safety issues/verbal cues  FIM - Upper Body Dressing/Undressing Upper body dressing/undressing steps patient completed: Thread/unthread right sleeve of pullover shirt/dresss, Thread/unthread left sleeve of pullover shirt/dress, Put head through opening of pull over shirt/dress, Pull shirt over trunk Upper body dressing/undressing: 5: Set-up assist to: Obtain clothing/put away FIM - Lower Body Dressing/Undressing Lower body dressing/undressing steps patient completed: Thread/unthread right underwear leg, Thread/unthread left underwear leg, Pull underwear up/down, Thread/unthread right pants leg, Pull pants up/down, Thread/unthread left pants leg, Don/Doff right sock, Don/Doff left sock, Fasten/unfasten right shoe, Fasten/unfasten left shoe Lower body dressing/undressing: 4: Steadying Assist  FIM - Toileting Toileting steps completed by patient:  Adjust clothing prior to toileting, Performs perineal hygiene, Adjust clothing after toileting Toileting Assistive  Devices: Grab bar or rail for support Toileting: 5: Supervision: Safety issues/verbal cues  FIM - Air cabin crew Transfers Assistive Devices: Environmental consultant, Bedside commode, Product manager Transfers: 0-Activity did not occur  FIM - Control and instrumentation engineer Devices: Arm rests, Copy: 5: Chair or W/C > Bed: Supervision (verbal cues/safety issues), 5: Bed > Chair or W/C: Supervision (verbal cues/safety issues)  FIM - Locomotion: Wheelchair Distance: 150 Locomotion: Wheelchair: 2: Travels 81 - 149 ft with supervision, cueing or coaxing FIM - Locomotion: Ambulation Locomotion: Ambulation Assistive Devices: Administrator Ambulation/Gait Assistance: 4: Min guard, 5: Supervision Locomotion: Ambulation: 2: Travels 50 - 149 ft with minimal assistance (Pt.>75%)  Comprehension Comprehension Mode: Auditory Comprehension: 6-Follows complex conversation/direction: With extra time/assistive device  Expression Expression Mode: Verbal Expression: 7-Expresses complex ideas: With no assist  Social Interaction Social Interaction: 7-Interacts appropriately with others - No medications needed.  Problem Solving Problem Solving: 6-Solves complex problems: With extra time  Memory Memory: 7-Complete Independence: No helper  Medical Problem List and Plan: 1. Functional deficits secondary to multiple myeloma to thoracic spine status post decompression and fusion T4-7 06/18/2014  -sutures out   -reviewed post-operative images with patient so that he could see what was done. 2. DVT Prophylaxis/Anticoagulation: SCDs. 3. Pain Management: Hydrocodone and Robaxin as needed. Monitor with increased mobility  -good posture/ROM 4. Hypothyroidism. Synthroid 5. Neuropsych: This patient is capable of making decisions on his own behalf. 6. Skin/Wound Care: routine skin checks 7. Fluids/Electrolytes/Nutrition: strict I and O's.   -labs normal this  week 8.BPH/history of prostate cancer. Flomax 0.4 mg daily at bedtime. Emptying bladder without issues 9. Bowels: emptied over weekend 10. ABLA: 10.3 hgb 11. Onc: path + for MM.     -pt has appt scheduled for next week with Dr. Julien Nordmann   LOS (Days) 15 A FACE TO FACE EVALUATION WAS PERFORMED  , T 07/05/2014 7:30 AM

## 2014-07-05 NOTE — Discharge Instructions (Signed)
Inpatient Rehab Discharge Instructions  Jeremiah Owens Discharge date and time: No discharge date for patient encounter.   Activities/Precautions/ Functional Status: Activity: activity as tolerated Diet: regular diet Wound Care: keep wound clean and dry Functional status:  ___ No restrictions     ___ Walk up steps independently ___ 24/7 supervision/assistance   ___ Walk up steps with assistance ___ Intermittent supervision/assistance  ___ Bathe/dress independently ___ Walk with walker     ___ Bathe/dress with assistance ___ Walk Independently    ___ Shower independently _x__ Walk with assistance    ___ Shower with assistance ___ No alcohol     ___ Return to work/school ________    COMMUNITY REFERRALS UPON DISCHARGE:    Home Health:   PT     OT                      Agency:  [facility] JKKXF:[phone]   Medical Equipment/Items Ordered: wheelchair, cushion, rolling walker and drop arm commode                                                                 Agency/Supplier: [facility] @ [phone]      Special Instructions:    My questions have been answered and I understand these instructions. I will adhere to these goals and the provided educational materials after my discharge from the hospital.  Patient/Caregiver Signature _______________________________ Date __________  Clinician Signature _______________________________________ Date __________  Please bring this form and your medication list with you to all your follow-up doctor's appointments.

## 2014-07-05 NOTE — Progress Notes (Signed)
Physical Therapy Session Note  Patient Details  Name: Jeremiah Owens MRN: [id number] Date of Birth: 1929-10-22  Today's Date: 07/05/2014 PT Individual Time: 0830-0930 Treatment Session 2: 1300-1400 PT Individual Time Calculation (min): 60 min  Treatment Session 2: 60 min  Short Term Goals: Week 2:  PT Short Term Goal 1 (Week 2): STGs = LTgs due to ELOS  Skilled Therapeutic Interventions/Progress Updates:    W/C Management: Pt demonstrates w/c propulsion on level surface in controlled environment mod I x 200'.  PT instructs pt in w/c propulsion up/down ramp in w/c with B UEs req min A, but progressing to Mod I after verbal cues for technique (hinge forward at the hips on the way up, push and stop with your arms, use a tight grip and slowly loosen to control the descent) x 5 reps total.   Gait Training: PT instructs pt in ambulation with RW x 130' req SBA - CGA with a turn + 135' req close SBA for safety + 135' req SBA for safety including a floor transition from tile to low pile carpet. Pt's focus was on maintaining balance and using good judgment to request a seated rest break before too fatigued or when legs get too unsteady.  PT instructs pt in ascending/descending 5 practice steps with B rails and close SBA x 3 sets in a row for a total of 15 stairs in step-over-step pattern - focus on pt using good judgment for when he req a seated rest break. Pt maintains spinal precautions throughout this activity.   Treatment Session 2: PT dons Foot-Up AFO to pt's L foot for 2nd treatment session.   Neuromuscular Reeducation: PT instructs pt in Berg Balance Test and pt scores 23/56, indicating significant falls risk, but also a significant improvement in MCID in the last weak.   Therapeutic Activity: PT instructs pt in furniture transfer with RW to easy chair with armrests req SBA for safety.  Pt demonstrates mod I w/c to/from mat transfer via squat-pivot transfer and stand-step transfer with RW.   PT instructs pt in furniture transfer with RW to low,soft loveseat req SBA for safety (L armrest to push off of loveseat with). PT instructs pt in car transfer with RW req SBA for safety.   Gait Training: PT instructs pt in ambulation with RW x 100' + 50' + 75' req SBA for safety during PT session - pt's focus is on maintaining balance and PT provides verbal cues to slow down during turns.   PT instructs pt in ascending/descending ramp with RW req SBA; PT instructs pt in ascending/descending step off of edge of ramp (backwards ascent) req min A for balance.   Pt is continuing to show progress in balance, endurance, and safety with gait on all surfaces. Pt will benefit from continued PT to maximize functional independence. Continue per PT POC.    Therapy Documentation Precautions:  Precautions Precautions: Fall, Back Precaution Booklet Issued: No Precaution Comments: Reviewed back precautions Restrictions Weight Bearing Restrictions: No  Pain: Pain Assessment Pain Assessment: 0-10 Pain Score: 2  Pain Type: Surgical pain Pain Location: Back Pain Orientation: Upper Pain Descriptors / Indicators: Aching Pain Onset: On-going Pain Intervention(s): Rest;Repositioned Multiple Pain Sites: No Treatment Session 2: Pt c/o 2/10 pain in upper back; PT utilizes rest and repositioning to decrease pain.   Balance: Balance Balance Assessed: Yes Standardized Balance Assessment Standardized Balance Assessment: Berg Balance Test Berg Balance Test Sit to Stand: Able to stand  independently using hands Standing Unsupported: Able to  stand 2 minutes with supervision Sitting with Back Unsupported but Feet Supported on Floor or Stool: Able to sit safely and securely 2 minutes Stand to Sit: Controls descent by using hands Transfers: Able to transfer safely, definite need of hands Standing Unsupported with Eyes Closed: Able to stand 3 seconds Standing Ubsupported with Feet Together: Needs help to  attain position but able to stand for 30 seconds with feet together From Standing, Reach Forward with Outstretched Arm: Reaches forward but needs supervision From Standing Position, Pick up Object from Floor: Unable to try/needs assist to keep balance From Standing Position, Turn to Look Behind Over each Shoulder: Needs assist to keep from losing balance and falling (NT due to spinal precautions) Turn 360 Degrees: Needs assistance while turning Standing Unsupported, Alternately Place Feet on Step/Stool: Able to complete >2 steps/needs minimal assist Standing Unsupported, One Foot in Front: Able to take small step independently and hold 30 seconds Standing on One Leg: Unable to try or needs assist to prevent fall Total Score: 23  See FIM for current functional status  Therapy/Group: Individual Therapy  Natacha Jepsen M 07/05/2014, 8:33 AM

## 2014-07-05 NOTE — Plan of Care (Signed)
Problem: RH BOWEL ELIMINATION Goal: RH STG MANAGE BOWEL WITH ASSISTANCE STG Manage Bowel with Assistance. Mod I  Outcome: Progressing Goal: RH STG MANAGE BOWEL W/MEDICATION W/ASSISTANCE STG Manage Bowel with Medication withmin Assistance.  Outcome: Progressing  Problem: RH SKIN INTEGRITY Goal: RH STG SKIN FREE OF INFECTION/BREAKDOWN No new skin breakdown/infection while on rehab with Min A of caregiver  Outcome: Progressing Goal: RH STG ABLE TO PERFORM INCISION/WOUND CARE W/ASSISTANCE STG Able To Perform Incision/Wound Care With total assist of caregiver.  Outcome: Progressing  Problem: RH SAFETY Goal: RH STG ADHERE TO SAFETY PRECAUTIONS W/ASSISTANCE/DEVICE STG Adhere to Safety Precautions With Assistance/Device. supervision  Outcome: Progressing  Problem: RH PAIN MANAGEMENT Goal: RH STG PAIN MANAGED AT OR BELOW PT'S PAIN GOAL <5  Outcome: Progressing

## 2014-07-06 ENCOUNTER — Inpatient Hospital Stay (HOSPITAL_COMMUNITY): Payer: Medicare Other | Admitting: Physical Therapy

## 2014-07-06 ENCOUNTER — Inpatient Hospital Stay (HOSPITAL_COMMUNITY): Payer: Medicare Other

## 2014-07-06 ENCOUNTER — Encounter (HOSPITAL_COMMUNITY): Payer: Self-pay | Admitting: Neurosurgery

## 2014-07-06 NOTE — Progress Notes (Signed)
Occupational Therapy Session and Discharge Summary  Patient Details  Name: Jeremiah Owens MRN: [id number] Date of Birth: [birth year]  Today's Date: 07/06/2014 OT Individual Time: 0930-1030 OT Individual Time Calculation (min): 60 min   Skilled Intervention: ADL-retraining with focus on discharge planning, functional mobility, transfers, and effective use of AE.   Pt gathered clothing from w/c level, propelled w/c to bathroom and completed all self-care at w/c level, performing stand-pivot transfers as needed, and is able to stand to wash buttocks with good safety and awareness using bath bench, grab bars, reacher, sock aid, LH sponge proficiently during session.   During review of goals and his performance with BADL pt endorsed plan to complete BADL at w/c level without need for assistance from his wife.   Pt is aware of need for continued therapy to advance with functional mobility and dynamic standing balance.   Pt groomed at sink in w/c at end of session and only required assist to don TEDs while dressing lower body.                                                                                    Discharge Summary  Patient has met 8 of 9 long term goals due to improved activity tolerance, improved balance, ability to compensate for deficits and improved awareness.  Patient to discharge at overall Modified Independent level.  Patient's care partner is independent to provide the necessary physical assistance at discharge.    Reasons goals not met: Goal to complete simple meal prep was attempted and satisfied to pt's preference to incorporate physical assist from his wife vs pt completing task with supervision.   Recommendation:  Patient will benefit not require ongoing skilled OT services in home health or outpatient setting to continue to advance functional skills in the area of BADL and iADL.  Equipment: BSC  Reasons for discharge: treatment goals met and discharge from  hospital  Patient/family agrees with progress made and goals achieved: Yes  OT Discharge Precautions/Restrictions  Precautions Precautions: Fall;Back Precaution Comments: Pt is able to 3/3 verbalize spinal precautions Restrictions Weight Bearing Restrictions: No  Pain Pain Assessment Pain Assessment: 0-10 Pain Score: 4  Pain Type: Surgical pain Pain Location: Back Pain Orientation: Upper Pain Descriptors / Indicators: Aching Pain Onset: With Activity Pain Intervention(s): Rest;Repositioned Multiple Pain Sites: No  ADL ADL Equipment Provided: Reacher, Sock aid, Long-handled shoe horn, Long-handled sponge Eating: Independent Where Assessed-Eating: Chair Grooming: Modified independent Where Assessed-Grooming: Sitting at sink, Wheelchair Upper Body Bathing: Modified independent Where Assessed-Upper Body Bathing: Shower Lower Body Bathing: Modified independent Where Assessed-Lower Body Bathing: Shower Upper Body Dressing: Independent Where Assessed-Upper Body Dressing: Edge of bed Lower Body Dressing: Modified independent Where Assessed-Lower Body Dressing: Edge of bed, Wheelchair Toileting: Modified independent Where Assessed-Toileting: Bedside Commode Toilet Transfer: Modified independent Armed forces technical officer Method: Arts development officer: Radiographer, therapeutic: Modified independent Social research officer, government: Modified independent Social research officer, government Method: Radiographer, therapeutic: Radio broadcast assistant  Vision/Perception  Vision- History Baseline Vision/History: Wears glasses Wears Glasses: Reading only Patient Visual Report: No change from baseline Vision- Assessment Vision Assessment?: No apparent visual deficits Perception Comments: wfl  Cognition Overall Cognitive Status: Within Functional Limits for tasks assessed Arousal/Alertness: Awake/alert Orientation Level: Oriented X4 Attention:  Focused;Sustained;Selective;Alternating Focused Attention: Appears intact Sustained Attention: Appears intact Selective Attention: Appears intact Alternating Attention: Appears intact Memory: Appears intact Problem Solving: Appears intact Safety/Judgment: Appears intact  Sensation Sensation Light Touch: Appears Intact Stereognosis: Not tested Hot/Cold: Not tested Proprioception: Impaired Detail Proprioception Impaired Details: Impaired LLE;Impaired RLE Additional Comments: L LE proprioceptive impairment greater than R LE proprio ceptive impairment Coordination Gross Motor Movements are Fluid and Coordinated: No Fine Motor Movements are Fluid and Coordinated: Not tested Coordination and Movement Description: ataxic Finger Nose Finger Test: slight end point/destination difficulty and intention tremor bilaterally Heel Shin Test: impaired  control more L side > R side  Motor  Motor Motor: Ataxia Motor - Discharge Observations: B LE limb ataxia: L worse than R  Mobility  Bed Mobility Bed Mobility: Rolling Left;Left Sidelying to Sit;Sit to Supine Rolling Left: 6: Modified independent (Device/Increase time) Left Sidelying to Sit: 6: Modified independent (Device/Increase time) Sit to Supine: 6: Modified independent (Device/Increase time) Transfers Sit to Stand: 6: Modified independent (Device/Increase time) Stand to Sit: 6: Modified independent (Device/Increase time)   Trunk/Postural Assessment  Cervical Assessment Cervical Assessment: Exceptions to [facility] Cervical AROM Overall Cervical AROM: Deficits;Due to premorid status Overall Cervical AROM Comments: significant forward head Thoracic Assessment Thoracic Assessment: Exceptions to [facility] Thoracic AROM Overall Thoracic AROM: Deficits;Due to precautions Lumbar Assessment Lumbar Assessment: Within Functional Limits Postural Control Postural Control: Deficits on evaluation Postural Limitations: slouched and tendency towards  sacral sitting posture   Balance Balance Balance Assessed: Yes Standardized Balance Assessment Standardized Balance Assessment: Timed Up and Go Test Timed Up and Go Test TUG: Normal TUG Normal TUG (seconds): 19 Static Sitting Balance Static Sitting - Balance Support: Feet supported;No upper extremity supported Static Sitting - Level of Assistance: 7: Independent Dynamic Sitting Balance Dynamic Sitting - Balance Support: Feet supported;Right upper extremity supported;Left upper extremity supported Dynamic Sitting - Level of Assistance: 6: Modified independent (Device/Increase time) Static Standing Balance Static Standing - Balance Support: During functional activity;Bilateral upper extremity supported Static Standing - Level of Assistance: 6: Modified independent (Device/Increase time) Dynamic Standing Balance Dynamic Standing - Balance Support: Bilateral upper extremity supported;During functional activity Dynamic Standing - Level of Assistance: 6: Modified independent (Device/Increase time) Dynamic Standing - Balance Activities: Lateral lean/weight shifting;Forward lean/weight shifting  Extremity/Trunk Assessment RUE Assessment RUE Assessment: Exceptions to [facility] RUE AROM (degrees) Overall AROM Right Upper Extremity: Within functional limits for tasks performed RUE Strength RUE Overall Strength: Deficits;Due to premorbid status RUE Overall Strength Comments: grip 5/5, elbow flexion 5/5, elbow extension 4/5, arm flexion 4/5 LUE Assessment LUE Assessment: Exceptions to WFL LUE AROM (degrees) Overall AROM Left Upper Extremity: Deficits;Due to premorbid status LUE Overall AROM Comments: limited by osteoarthritis; mainly limited at shoulder with grossly 70* shoulder flexion LUE Strength LUE Overall Strength: Deficits;Due to premorbid status LUE Overall Strength Comments: arm flexion 2+/5, elbow flexion 5/5, elbow extension 4/5, grip 5/5  See FIM for current functional  status  Jeremiah Owens 07/06/2014, 3:07 PM

## 2014-07-06 NOTE — Discharge Summary (Signed)
Discharge summary job [id number]

## 2014-07-06 NOTE — Plan of Care (Signed)
Problem: RH BOWEL ELIMINATION Goal: RH STG MANAGE BOWEL W/MEDICATION W/ASSISTANCE STG Manage Bowel with Medication with min Assistance.  Outcome: Progressing     

## 2014-07-06 NOTE — Plan of Care (Signed)
Problem: RH SAFETY Goal: RH STG ADHERE TO SAFETY PRECAUTIONS W/ASSISTANCE/DEVICE STG Adhere to Safety Precautions With Assistance/Device. supervision  Outcome: Progressing

## 2014-07-06 NOTE — Plan of Care (Signed)
Problem: RH BOWEL ELIMINATION Goal: RH STG MANAGE BOWEL WITH ASSISTANCE STG Manage Bowel with Assistance. Mod I  Outcome: Progressing Goal: RH STG MANAGE BOWEL W/MEDICATION W/ASSISTANCE STG Manage Bowel with Medication withmin Assistance.  Outcome: Progressing  Problem: RH SKIN INTEGRITY Goal: RH STG SKIN FREE OF INFECTION/BREAKDOWN No new skin breakdown/infection while on rehab with Min A of caregiver  Outcome: Progressing Goal: RH STG ABLE TO PERFORM INCISION/WOUND CARE W/ASSISTANCE STG Able To Perform Incision/Wound Care With total assist of caregiver.  Outcome: Progressing  Problem: RH SAFETY Goal: RH STG ADHERE TO SAFETY PRECAUTIONS W/ASSISTANCE/DEVICE STG Adhere to Safety Precautions With Assistance/Device. supervision  Outcome: Progressing  Problem: RH PAIN MANAGEMENT Goal: RH STG PAIN MANAGED AT OR BELOW PT'S PAIN GOAL <5  Outcome: Progressing

## 2014-07-06 NOTE — Plan of Care (Signed)
Problem: RH Balance Goal: LTG Patient will maintain dynamic standing with ADLs (OT) LTG: Patient will maintain dynamic standing balance with assist during activities of daily living (OT)  Outcome: Completed/Met Date Met:  07/06/14  Problem: RH Grooming Goal: LTG Patient will perform grooming w/assist,cues/equip (OT) LTG: Patient will perform grooming with assist, with/without cues using equipment (OT)  Outcome: Completed/Met Date Met:  07/06/14  Problem: RH Bathing Goal: LTG Patient will bathe with assist, cues/equipment (OT) LTG: Patient will bathe specified number of body parts with assist with/without cues using equipment (position) (OT)  Outcome: Completed/Met Date Met:  07/06/14  Problem: RH Dressing Goal: LTG Patient will perform upper body dressing (OT) LTG Patient will perform upper body dressing with assist, with/without cues (OT).  Outcome: Completed/Met Date Met:  07/06/14 Goal: LTG Patient will perform lower body dressing w/assist (OT) LTG: Patient will perform lower body dressing with assist, with/without cues in positioning using equipment (OT)  Outcome: Completed/Met Date Met:  07/06/14  Problem: RH Toileting Goal: LTG Patient will perform toileting w/assist, cues/equip (OT) LTG: Patient will perform toiletiing (clothes management/hygiene) with assist, with/without cues using equipment (OT)  Outcome: Completed/Met Date Met:  07/06/14  Problem: RH Simple Meal Prep Goal: LTG Patient will perform simple meal prep w/assist (OT) LTG: Patient will perform simple meal prep with assistance, with/without cues (OT).  Outcome: Adequate for Discharge  Problem: RH Toilet Transfers Goal: LTG Patient will perform toilet transfers w/assist (OT) LTG: Patient will perform toilet transfers with assist, with/without cues using equipment (OT)  Outcome: Completed/Met Date Met:  07/06/14  Problem: RH Tub/Shower Transfers Goal: LTG Patient will perform tub/shower transfers w/assist  (OT) LTG: Patient will perform tub/shower transfers with assist, with/without cues using equipment (OT)  Outcome: Completed/Met Date Met:  07/06/14

## 2014-07-06 NOTE — Plan of Care (Signed)
Problem: RH SKIN INTEGRITY Goal: RH STG ABLE TO PERFORM INCISION/WOUND CARE W/ASSISTANCE STG Able To Perform Incision/Wound Care With total assist of caregiver.  Outcome: Progressing

## 2014-07-06 NOTE — Plan of Care (Signed)
Problem: RH Stairs Goal: LTG Patient will ambulate up and down stairs w/assist (PT) LTG: Patient will ambulate up and down # of stairs with assistance (PT)  Outcome: Not Met (add Reason) Pt continues to req CGA for safety with balance during stairs with a RW.

## 2014-07-06 NOTE — Progress Notes (Signed)
Physical Therapy Discharge Summary  Patient Details  Name: Jeremiah Owens MRN: [id number] Date of Birth: 04-Jun-1930  Today's Date: 07/06/2014 PT Individual Time: 0830-0930 Treatment Session 2: 1100-1200 Treatment Session 3: 1430-1530 PT Individual Time Calculation (min): 60 min Treatment Session 2: 60 min Treatment Session 3: 60 min   Patient has met 10 of 11 long term goals due to improved activity tolerance, improved balance, improved postural control, increased strength, decreased pain, ability to compensate for deficits and improved coordination.  Patient to discharge at a wheelchair level Modified Independent.   Patient's care partner is independent to provide the necessary physical assistance at discharge.  Reasons goals not met: Pt continues to req CGA to ascend/descend 2 stairs with RW for balance and safety.   Recommendation:  Patient will benefit from ongoing skilled PT services in home health setting to continue to advance safe functional mobility, address ongoing impairments in endurance, progress towards community level ambulation, safety with increasing gait distances, high level balance skills with LRAD, overall strength, and minimize fall risk.  Equipment: manual w/c, RW, foot-up orthosis  Reasons for discharge: treatment goals met and discharge from hospital  Patient/family agrees with progress made and goals achieved: Yes  Therapeutic Interventions: Treatment Session 1: Neuromuscular Reeducation: PT instructs pt in TUG and pt scores: 36 seconds, 22 seconds, and 19 seconds on 3 attempts.   Therapeutic Activity: Pt demonstrates mod I bed mobility including supine to/from sit via side lie to follow spinal precautions.  Pt demonstrates mod I squat-pivot transfer w/c to/from bed, which is how he plans on transferring at home.  Pt demonstrates mod I stand-step transfer with RW from w/c to/from bed.   Gait Training: Pt demonstrates mod I ambulation with RW during first  100' of total 225' walk, but with fatigue pt demonstrates increased foot drag/decreased toe clearance and demonstrates 2 LOB with pt self correcting at the very end of the walk, so overall pt req SBA during second half of that walk. PT instructs pt in second bout of ambulation 200', again mod I first 100', then progressing to SBA req for safety during second 100'.  PT instructs pt in ascending/descending 15 steps with B rails req SBA - pt demonstrates occasional buckle of L leg on descent, but is able to self correct and maintain balance without external assist. PT explains to pt that if he has his walker with him in the community, he is to push the walker to the side and transition to 2 handrails, then ascend steps, and have wife bring walker up the stairs behind him. He is to always do stairs with supervision from an able bodied person who can assist him if he loses his balance; pt verbalizes understanding.  Treatment Session 2: Family Training complete with wife present during this session: Gait Training: PT instructs wife in guarding pt for short distance ambulation on varying floor surface at home and in conducting 2-3 walks per day at home for a home exercise program. PT instructs wife to stand close enough to pt and to keep eyes focused on pt so that in case pt loses his balance, she is able to stick her arm out in front of him and steady him - wife verbalizes and demonstrates understanding.  PT instructs wife in providing CGA in assisting pt in ascending one 8" step backwards with RW 2 sets of 5 reps and descending one 8" step forward req CGA - PT gives wife specific cues to stand behind pt when ascending and  to stand in front/to the side of pt when descending and to hold onto pt's pants. PT also instructs wife in using other hand to assist in stabilizing RW. Wife and pt demonstrate understanding.   Orthotic Management: PT instructs wife in how to don/doff the foot up orthotic and wife demonstrates  understanding. PT explains that if pt is to continue wearing knee high TED hose for swelling, then wife should use rubber kitchen gloves to assist in donning the hose. PT explains that the more active the pt is during the day, the less that blood clots and swelling will be an issue, but that pt can also combat these problems by doing at least 20 ankle pumps hourly when sitting up; both pt and wife verbalize understanding.   Therapeutic Activity: PT instructs wife in providing SBA when pt completes car transfer with RW. PT instructs wife in collapsing and expanding RW for placing in car and assembling for use after traveling in the community. Wife demonstrates understanding.   Treatment Session 3: Community Integration: Pt demonstrates mod I w/c propulsion x 300' in community area: gift shop, including on/off elevator, pressing elevator call button and floor buttons.  PT discusses community modification, including, purchasing a ramp for wife to wheel w/c easily in/out of trunk of jeep. PT also explained that pt could self purchase a transport chair due to wife difficulty in picking up pt's manual w/c.   Therapeutic Activity: Pt demonstrates mod I transfer w/c to/from loveseat with RW via stand-step transfer.  Therapeutic Exercise: PT instructs pt in HEP so that pt can continue to get stronger when at home: LAQ, standing knee flexion, standing hip abduction, heel raises, and mini squats x 10 reps each LE.   Pt has progressed well during his IPR stay and is safe to d/c home with wife's assist. Pt will receive HHPT to maximize functional independence.      PT Discharge Precautions/Restrictions Precautions Precautions: Fall;Back Precaution Comments: Pt is able to 3/3 verbalize spinal precautions Restrictions Weight Bearing Restrictions: No Vital Signs Therapy Vitals Temp: 98.5 F (36.9 C) Temp Source: Oral Pulse Rate: 69 Resp: 17 BP: 119/66 mmHg Oxygen Therapy SpO2: 96 % O2 Device:  Not Delivered Pain Pain Assessment Pain Assessment: 0-10 Pain Score: 4  Pain Type: Surgical pain Pain Location: Back Pain Orientation: Upper Pain Descriptors / Indicators: Aching Pain Frequency: Intermittent Pain Onset: With Activity Pain Intervention(s): Rest;Repositioned Multiple Pain Sites: No  Treatment Session 2: Pt c/o 4/10 pain in upper back with activity; PT uses rest & repositioning to decrease pt's pain.  Treatment Session 3: Pt c/o 2/10 pain in upper back that radiates to R scapula with activity; PT uses rest and emotional support to decrease pt's pain.  Vision/Perception  Perception Comments: wfl  Cognition Overall Cognitive Status: Within Functional Limits for tasks assessed Arousal/Alertness: Awake/alert Orientation Level: Oriented X4 Attention: Focused;Sustained;Selective;Alternating Focused Attention: Appears intact Sustained Attention: Appears intact Selective Attention: Appears intact Alternating Attention: Appears intact Memory: Appears intact Problem Solving: Appears intact Safety/Judgment: Appears intact Sensation Sensation Light Touch: Appears Intact Stereognosis: Not tested Hot/Cold: Not tested Proprioception: Impaired Detail Proprioception Impaired Details: Impaired LLE;Impaired RLE Additional Comments: L LE proprioceptive impairment greater than R LE proprio ceptive impairment Coordination Gross Motor Movements are Fluid and Coordinated: No Fine Motor Movements are Fluid and Coordinated: Not tested Coordination and Movement Description: ataxic Finger Nose Finger Test: slight end point/destination difficulty and intention tremor bilaterally Heel Shin Test: impaired  control more L side > R side Motor  Motor Motor: Ataxia Motor - Discharge Observations: B LE limb ataxia: L worse than R  Mobility Bed Mobility Bed Mobility: Rolling Left;Left Sidelying to Sit;Sit to Supine Rolling Left: 6: Modified independent (Device/Increase time) Left  Sidelying to Sit: 6: Modified independent (Device/Increase time) Sit to Supine: 6: Modified independent (Device/Increase time) Transfers Transfers: Yes Sit to Stand: 6: Modified independent (Device/Increase time) Stand to Sit: 6: Modified independent (Device/Increase time) Stand Pivot Transfers: 6: Modified independent (Device/Increase time) Squat Pivot Transfers: 6: Modified independent (Device/Increase time) Locomotion  Ambulation Ambulation: Yes Ambulation/Gait Assistance: 5: Supervision Ambulation Distance (Feet): 225 Feet Assistive device: Rolling walker;Other (Comment) (L foot up orthosis) Ambulation/Gait Assistance Details: Verbal cues for precautions/safety Ambulation/Gait Assistance Details: pt is mod I the first 100', but with fatigue req supervision to complete the rest of the 225' due to decreased foot clearance and worsening balance.  Gait Gait: Yes Gait Pattern: Impaired Gait Pattern: Poor foot clearance - left;Poor foot clearance - right;Trunk flexed;Narrow base of support;Scissoring;Ataxic;Step-through pattern Gait velocity: decreased Stairs / Additional Locomotion Stairs: Yes Stairs Assistance: 5: Supervision Stairs Assistance Details: Verbal cues for precautions/safety Stair Management Technique: Two rails;Alternating pattern Architect: Yes Wheelchair Assistance: 6: Modified independent (Device/Increase time) Environmental health practitioner: Both upper extremities;Both lower extermities Wheelchair Parts Management: Needs assistance (due to maintaining spinal precautions) Distance: 300  Trunk/Postural Assessment  Cervical Assessment Cervical Assessment: Exceptions to [facility] Cervical AROM Overall Cervical AROM: Deficits;Due to premorid status Overall Cervical AROM Comments: significant forward head Thoracic Assessment Thoracic Assessment: Exceptions to [facility] Thoracic AROM Overall Thoracic AROM: Deficits;Due to precautions Lumbar  Assessment Lumbar Assessment: Within Functional Limits Postural Control Postural Control: Deficits on evaluation Postural Limitations: slouched and tendency towards sacral sitting posture  Balance Balance Balance Assessed: Yes Standardized Balance Assessment Standardized Balance Assessment: Timed Up and Go Test Timed Up and Go Test TUG: Normal TUG Normal TUG (seconds): 19 Static Sitting Balance Static Sitting - Balance Support: Feet supported;No upper extremity supported Static Sitting - Level of Assistance: 7: Independent Dynamic Sitting Balance Dynamic Sitting - Balance Support: Feet supported;Right upper extremity supported;Left upper extremity supported Dynamic Sitting - Level of Assistance: 6: Modified independent (Device/Increase time) Static Standing Balance Static Standing - Balance Support: During functional activity;Bilateral upper extremity supported Static Standing - Level of Assistance: 6: Modified independent (Device/Increase time) Dynamic Standing Balance Dynamic Standing - Balance Support: Bilateral upper extremity supported;During functional activity Dynamic Standing - Level of Assistance: 6: Modified independent (Device/Increase time) Dynamic Standing - Balance Activities: Lateral lean/weight shifting;Forward lean/weight shifting Extremity Assessment  RUE Assessment RUE Assessment: Exceptions to [facility] RUE AROM (degrees) Overall AROM Right Upper Extremity: Within functional limits for tasks performed RUE Strength RUE Overall Strength: Deficits;Due to premorbid status RUE Overall Strength Comments: grip 5/5, elbow flexion 5/5, elbow extension 4/5, arm flexion 4/5 LUE Assessment LUE Assessment: Exceptions to WFL LUE AROM (degrees) Overall AROM Left Upper Extremity: Deficits;Due to premorbid status LUE Overall AROM Comments: limited by osteoarthritis; mainly limited at shoulder with grossly 70* shoulder flexion LUE Strength LUE Overall Strength: Deficits;Due to  premorbid status LUE Overall Strength Comments: arm flexion 2+/5, elbow flexion 5/5, elbow extension 4/5, grip 5/5 RLE Assessment RLE Assessment: Exceptions to [facility] RLE Strength RLE Overall Strength: Deficits RLE Overall Strength Comments: hip flexion 4/5, hip abduction 4/5, hip adduction 4/5, knee extension 5/5, knee flexion 5/5, ankle DF 5/5 RLE Tone RLE Tone: Other (comment) RLE Tone Comments: mild ataxia on this side LLE Assessment LLE Assessment: Exceptions to [employer] LLE Strength LLE Overall Strength:  Deficits LLE Overall Strength Comments: hip flexion 4/5, hip abduction 4/5, hip adduction 4/5, knee extension 5/5, knee flexion 5/5, ankle DF 4/5 (limited muscular endurance and fatigue with extended gait shown by foot drop) LLE Tone LLE Tone: Moderate;Other (comment) LLE Tone Comments: moderate ataxia  See FIM for current functional status  Leoda Smithhart M 07/06/2014, 8:59 AM

## 2014-07-06 NOTE — Plan of Care (Signed)
Problem: RH BOWEL ELIMINATION Goal: RH STG MANAGE BOWEL WITH ASSISTANCE STG Manage Bowel with Assistance. Mod I  Outcome: Progressing     

## 2014-07-06 NOTE — Progress Notes (Signed)
Social Work  Discharge Note  The overall goal for the admission was met for:   Discharge location: Yes - home with wife who can provide 24/7 assistance  Length of Stay: Yes - 17 days  Discharge activity level: Yes - supervision/ mod independent goals  Home/community participation: Yes  Services provided included: MD, RD, PT, OT, RN, TR, Pharmacy and [place]: Medicare and Private Insurance: T[employer]are  Follow-up services arranged: Home Health: PT, OT via [place], DME: 18x18 lightweight w/c, cushion, rolling walker, droparm commode via [place] and Patient/Family has no preference for HH/DME agencies  Comments (or additional information):  Patient/Family verbalized understanding of follow-up arrangements: Yes  Individual responsible for coordination of the follow-up plan: patient  Confirmed correct DME delivered: HOYLE, LUCY 07/06/2014    HOYLE, LUCY

## 2014-07-06 NOTE — Plan of Care (Signed)
Problem: RH PAIN MANAGEMENT Goal: RH STG PAIN MANAGED AT OR BELOW PT'S PAIN GOAL <5  Outcome: Progressing

## 2014-07-06 NOTE — Progress Notes (Signed)
[place] PHYSICAL MEDICINE & REHABILITATION     PROGRESS NOTE    Subjective/Complaints: Anxiety building up around dc planning and family ed. Otherwise doing quite well A  review of systems has been performed and if not noted above is otherwise negative.   Objective: Vital Signs: Blood pressure 119/66, pulse 69, temperature 98.5 F (36.9 C), temperature source Oral, resp. rate 17, weight 74 kg (163 lb 2.3 oz), SpO2 96 %. No results found.  Recent Labs  07/04/14 0430  WBC 4.4  HGB 10.3*  HCT 31.0*  PLT 201    Recent Labs  07/04/14 0430  NA 133*  K 4.1  CL 97  GLUCOSE 91  BUN 18  CREATININE 0.90  CALCIUM 8.9   CBG (last 3)  No results for input(s): GLUCAP in the last 72 hours.  Wt Readings from Last 3 Encounters:  07/05/14 74 kg (163 lb 2.3 oz)  06/17/14 73.3 kg (161 lb 9.6 oz)  06/15/14 74.844 kg (165 lb)    Physical Exam:  HENT: dentition good Head: Normocephalic.  Right Ear: External ear normal.  Left Ear: External ear normal.  Eyes: Conjunctivae and EOM are normal. Pupils are equal, round, and reactive to light. Right eye exhibits no discharge. Left eye exhibits no discharge.  Neck: Normal range of motion. Neck supple. No JVD present. No tracheal deviation present. No thyromegaly present.  Cardiovascular: Normal rate, regular rhythm and normal heart sounds.  Respiratory: Effort normal and breath sounds normal. No respiratory distress. He has no wheezes. He has no rales. He exhibits no tenderness.  GI: Soft. Bowel sounds are normal. He exhibits no distension. There is no tenderness. There is no rebound.  Musculoskeletal:  Head forward posture.  Mild pedal edema Lymphadenopathy:   He has no cervical adenopathy.  Neurological: He is alert and oriented to person, place, and time.  Follows commands. No cn findings. Left shoulder limited due to pain/ tightness. Otherwise UE 4/5. LE: HF 4-5. KE 4/5, ADF/APF 4/5. Decreased LT/proprioception in both  feet. No resting tone.  Skin: Skin is warm.  Back incision is clean and intact--sutures out  dry. No erythema Psychiatric: He has a normal mood and affect. His behavior is normal. Judgment and thought content normal  Assessment/Plan: 1. 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 Physiatrist is providing close team supervision and 24 hour management of active medical problems listed below. Physiatrist and rehab team continue to assess barriers to discharge/monitor patient progress toward functional and medical goals.  Family ed today  FIM: FIM - Bathing Bathing Steps Patient Completed: Chest, Left Arm, Buttocks, Right Arm, Front perineal area, Abdomen, Right upper leg, Left upper leg, Right lower leg (including foot), Left lower leg (including foot) Bathing: 5: Supervision: Safety issues/verbal cues  FIM - Upper Body Dressing/Undressing Upper body dressing/undressing steps patient completed: Thread/unthread right sleeve of pullover shirt/dresss, Thread/unthread left sleeve of pullover shirt/dress, Put head through opening of pull over shirt/dress, Pull shirt over trunk Upper body dressing/undressing: 5: Set-up assist to: Obtain clothing/put away FIM - Lower Body Dressing/Undressing Lower body dressing/undressing steps patient completed: Thread/unthread right underwear leg, Thread/unthread left underwear leg, Pull underwear up/down, Thread/unthread right pants leg, Pull pants up/down, Thread/unthread left pants leg, Don/Doff right sock, Don/Doff left sock, Fasten/unfasten right shoe, Fasten/unfasten left shoe Lower body dressing/undressing: 4: Steadying Assist  FIM - Toileting Toileting steps completed by patient:  Adjust clothing prior to toileting, Performs perineal hygiene, Adjust clothing after toileting Toileting Assistive  Devices: Grab bar or rail for support Toileting: 5: Supervision: Safety issues/verbal cues  FIM - Radio producer Devices: Bedside commode Toilet Transfers: 5-To toilet/BSC: Supervision (verbal cues/safety issues), 5-From toilet/BSC: Supervision (verbal cues/safety issues)  FIM - Control and instrumentation engineer Devices: Walker, Arm rests Bed/Chair Transfer: 5: Bed > Chair or W/C: Supervision (verbal cues/safety issues), 5: Chair or W/C > Bed: Supervision (verbal cues/safety issues)  FIM - Locomotion: Wheelchair Distance: 150 Locomotion: Wheelchair: 6: Travels 150 ft or more, turns around, maneuvers to table, bed or toilet, negotiates 3% grade: maneuvers on rugs and over door sills independently FIM - Locomotion: Ambulation Locomotion: Ambulation Assistive Devices: Administrator Ambulation/Gait Assistance: 5: Supervision Locomotion: Ambulation: 2: [place] 33 - 149 ft with supervision/safety issues  Comprehension Comprehension Mode: Auditory Comprehension: 6-Follows complex conversation/direction: With extra time/assistive device  Expression Expression Mode: Verbal Expression: 7-Expresses complex ideas: With no assist  Social Interaction Social Interaction: 7-Interacts appropriately with others - No medications needed.  Problem Solving Problem Solving: 5-Solves complex 90% of the time/cues < 10% of the time  Memory Memory: 7-Complete Independence: No helper  Medical Problem List and Plan: 1. Functional deficits secondary to multiple myeloma to thoracic spine status post decompression and fusion T4-7 06/18/2014  -sutures out   -wounds looks great 2. DVT Prophylaxis/Anticoagulation: SCDs. 3. Pain Management: Hydrocodone and Robaxin as needed. Monitor with increased mobility  -good posture/ROM 4. Hypothyroidism. Synthroid 5. Neuropsych: This patient is capable of making decisions on his own behalf. 6. Skin/Wound Care: routine  skin checks 7. Fluids/Electrolytes/Nutrition: strict I and O's.   -labs normal this week 8.BPH/history of prostate cancer. Flomax 0.4 mg daily at bedtime. Emptying bladder without issues 9. Bowels: emptied over weekend 10. ABLA: 10.3 hgb 11. Onc: path + for MM.     -pt has appt scheduled for next week with Dr. Julien Nordmann   LOS (Days) 16 A FACE TO FACE EVALUATION WAS PERFORMED  Tanekia Ryans T 07/06/2014 7:59 AM

## 2014-07-06 NOTE — Plan of Care (Signed)
Problem: RH SKIN INTEGRITY Goal: RH STG SKIN FREE OF INFECTION/BREAKDOWN No new skin breakdown/infection while on rehab with Min A of caregiver  Outcome: Progressing

## 2014-07-06 NOTE — Plan of Care (Signed)
Problem: RH Balance Goal: LTG Patient will maintain dynamic standing balance (PT) LTG: Patient will maintain dynamic standing balance with assistance during mobility activities (PT)  Outcome: Completed/Met Date Met:  07/06/14  Problem: RH Bed Mobility Goal: LTG Patient will perform bed mobility with assist (PT) LTG: Patient will perform bed mobility with assistance, with/without cues (PT).  Outcome: Completed/Met Date Met:  07/06/14  Problem: RH Bed to Chair Transfers Goal: LTG Patient will perform bed/chair transfers w/assist (PT) LTG: Patient will perform bed/chair transfers with assistance, with/without cues (PT).  Outcome: Completed/Met Date Met:  07/06/14  Problem: RH Car Transfers Goal: LTG Patient will perform car transfers with assist (PT) LTG: Patient will perform car transfers with assistance (PT).  Outcome: Completed/Met Date Met:  07/06/14  Problem: RH Furniture Transfers Goal: LTG Patient will perform furniture transfers w/assist (OT/PT LTG: Patient will perform furniture transfers with assistance (OT/PT).  Outcome: Completed/Met Date Met:  07/06/14  Problem: RH Ambulation Goal: LTG Patient will ambulate in controlled environment (PT) LTG: Patient will ambulate in a controlled environment, # of feet with assistance (PT).  Outcome: Completed/Met Date Met:  07/06/14 Goal: LTG Patient will ambulate in home environment (PT) LTG: Patient will ambulate in home environment, # of feet with assistance (PT).  Outcome: Completed/Met Date Met:  07/06/14  Problem: RH Wheelchair Mobility Goal: LTG Patient will propel w/c in controlled environment (PT) LTG: Patient will propel wheelchair in controlled environment, # of feet with assist (PT)  Outcome: Completed/Met Date Met:  07/06/14 Goal: LTG Patient will propel w/c in home environment (PT) LTG: Patient will propel wheelchair in home environment, # of feet with assistance (PT).  Outcome: Completed/Met Date Met:  07/06/14 Goal:  LTG Patient will propel w/c in community environment (PT) LTG: Patient will propel wheelchair in community environment, # of feet with assist (PT)  Outcome: Completed/Met Date Met:  07/06/14  Problem: RH Stairs Goal: LTG Patient will ambulate up and down stairs w/assist (PT) LTG: Patient will ambulate up and down # of stairs with assistance (PT)  Outcome: Completed/Met Date Met:  07/06/14

## 2014-07-06 NOTE — Discharge Summary (Signed)
Jeremiah Owens, Jeremiah Owens NO.:  [id number]  MEDICAL RECORD NO.:  [id number]  LOCATION:  4M01C                        FACILITY:  [facility]  PHYSICIAN:  Meredith Staggers, M.D.DATE OF BIRTH:  1929/08/14  DATE OF ADMISSION:  06/20/2014 DATE OF DISCHARGE:  07/07/2014                              DISCHARGE SUMMARY   DISCHARGE DIAGNOSES: 1. Functional deficits secondary to multiple myeloma to thoracic spine     with decompression and fusion on June 18, 2014. 2. Sequential compression devices for deep venous thrombosis     prophylaxis. 3. Pain management. 4. Hypothyroidism. 5. Benign prostatic hypertrophy with history of prostate cancer. 6. Acute blood loss anemia.  HISTORY OF PRESENT ILLNESS:  This is an 80 year old right-handed male with history of prostate cancer, diagnosed in 2002, underwent radiation treatment.  He has had a history of excision of melanoma from his right neck, independent with a walker prior to admission, living with his wife who was admitted on June 17, 2014, with progressive mid back pain and gait disorder as well as pain in the anterior chest bilaterally. Recent cardiac workup unremarkable.  MRI of thoracic spine June 08, 2014, revealed a spinal cord compression at T5 with enhancing lesion, pathologic fracture at T5, metastatic disease.  Underwent thoracic T5 laminectomy for epidural tumor resection, thoracic T4-T7 with arthrodesis segmental pedicle screw fixation on June 18, 2014, per Dr. Christella Noa.  HOSPITAL COURSE AND PAIN MANAGEMENT:  The patient completed a course of Decadron therapy.  Oncology follow Dr. Julien Nordmann, planning of awaiting pathology report to establish ongoing recommendations regarding treatment of condition.  He was scheduled to have a PET scan as an outpatient.  Physical and Occupational Therapy ongoing.  The patient was admitted for comprehensive rehab program.  PAST MEDICAL HISTORY:  See discharge  diagnoses.  SOCIAL HISTORY:  Married.  Lives with his wife.  FUNCTIONAL HISTORY:  Prior to admission independent with a walker. Functional status upon admission to rehab services was moderate assist +2 for stand pivot transfers, +2 physical assist sit to stand, moderate assist for side lying to sitting, min to mod assist for activities of daily living.  PHYSICAL EXAMINATION:  VITAL SIGNS:  Blood pressure 130/75, pulse 79, temperature 97, respirations 18. GENERAL:  This was an alert male in no acute distress.  Followed full commands.  Back incision clean and dry. LUNGS:  Clear to auscultation. CARDIAC:  Regular rate and rhythm. ABDOMEN:  Soft, nontender.  Good bowel sounds.  REHABILITATION HOSPITAL COURSE:  The patient was admitted to inpatient rehab services with therapies initiated on a 3-hour daily basis consisting of physical therapy, occupational therapy, and rehabilitation nursing.  The following issues were addressed during the patient's rehabilitation stay.  Pertaining to Mr. Keesling multiple myeloma to the thoracic spine, he had undergone decompression and fusion per Dr. Christella Noa of Neurosurgery.  Surgical site healing nicely.  Plan was to follow up outpatient Oncology Services to establish plan of care as well as PET scan.  Pain management with the use of hydrocodone and Robaxin with good results.  Sequential compression devices for DVT prophylaxis. He remained on hormone supplement for hypothyroidism.  Blood pressure is controlled without the use of antihypertensive  medication.  He did have a documented history of benign prostatic hypertrophy as well as prostate cancer.  He remained on Flomax at bedtime.  He was emptying his bladder without issues.  Acute blood loss anemia 10.3, again would follow up as an outpatient.  The patient received weekly collaborative interdisciplinary team conferences to discuss estimated length of stay, family teaching, and any barriers to  discharge.  Sessions focused on high-level ambulation skills, wheelchair mobility which he was modified independent.  In the rehab apartment and kitchen, performed side stepping in bilateral directions with his rolling walker and minimal guard, ambulating 75 feet in controlled environment with supervision level.  Min assist for activities of daily living with setup.  Strength and endurance continued to improve.  Home health therapies had been arranged.  Family teaching completed.  DISCHARGE MEDICATIONS:  Vitamin D 1000 units p.o. at bedtime, Pepcid 20 mg p.o. at bedtime, hydrocodone 1 to 2 tablets every 4 hours as needed pain dispense of 90 tablets, Synthroid 25 mcg p.o. daily, Robaxin 500 mg p.o. every 6 hours as needed muscle spasms, Flomax 0.4 mg p.o. at bedtime.  DIET:  Regular.  SPECIAL INSTRUCTIONS:  The patient would follow up with Dr. [name] at the outpatient rehab service office as needed; Dr. Ashok Pall, Neurosurgery, 2 weeks call for appointment; Dr. Curt Bears, call for appointment to establish ongoing plan of care upon discharge as well as arranging for PET scan; follow up with Dr. Lysle Rubens, medical management, on July 18, 2014.     Lauraine Rinne, P.A.   ______________________________ Meredith Staggers, M.D.    DA/MEDQ  D:  07/06/2014  T:  07/06/2014  Job:  [id number]  cc:   Meredith Staggers, M.D. Wenda Low, MD Eilleen Kempf, M.D. Ashok Pall, M.D.

## 2014-07-07 ENCOUNTER — Telehealth: Payer: Self-pay | Admitting: Internal Medicine

## 2014-07-07 ENCOUNTER — Other Ambulatory Visit: Payer: Self-pay | Admitting: Internal Medicine

## 2014-07-07 DIAGNOSIS — C9000 Multiple myeloma not having achieved remission: Secondary | ICD-10-CM

## 2014-07-07 MED ORDER — METHOCARBAMOL 500 MG PO TABS: 500.0000 mg | ORAL_TABLET | Freq: Four times a day (QID) | ORAL | Status: DC | PRN

## 2014-07-07 MED ORDER — HYDROCODONE-ACETAMINOPHEN 5-325 MG PO TABS: 1.0000 | ORAL_TABLET | ORAL | Status: DC | PRN

## 2014-07-07 MED ORDER — LEVOTHYROXINE SODIUM 25 MCG PO TABS: 25.0000 ug | ORAL_TABLET | Freq: Every day | ORAL | Status: DC

## 2014-07-07 MED ORDER — TAMSULOSIN HCL 0.4 MG PO CAPS: 0.4000 mg | ORAL_CAPSULE | Freq: Every day | ORAL | Status: DC

## 2014-07-07 NOTE — Plan of Care (Signed)
Problem: RH SAFETY Goal: RH STG ADHERE TO SAFETY PRECAUTIONS W/ASSISTANCE/DEVICE STG Adhere to Safety Precautions With Assistance/Device. supervision  Outcome: Completed/Met Date Met:  07/07/14

## 2014-07-07 NOTE — Plan of Care (Signed)
Problem: RH SKIN INTEGRITY Goal: RH STG SKIN FREE OF INFECTION/BREAKDOWN No new skin breakdown/infection while on rehab with Min A of caregiver  Outcome: Progressing

## 2014-07-07 NOTE — Plan of Care (Signed)
Problem: RH SKIN INTEGRITY Goal: RH STG ABLE TO PERFORM INCISION/WOUND CARE W/ASSISTANCE STG Able To Perform Incision/Wound Care With total assist of caregiver.  Outcome: Completed/Met Date Met:  07/07/14     

## 2014-07-07 NOTE — Plan of Care (Signed)
Problem: RH SKIN INTEGRITY Goal: RH STG SKIN FREE OF INFECTION/BREAKDOWN No new skin breakdown/infection while on rehab with Min A of caregiver  Outcome: Completed/Met Date Met:  07/07/14

## 2014-07-07 NOTE — Plan of Care (Signed)
Problem: RH BOWEL ELIMINATION Goal: RH STG MANAGE BOWEL WITH ASSISTANCE STG Manage Bowel with Assistance. Mod I  Outcome: Completed/Met Date Met:  07/07/14

## 2014-07-07 NOTE — Progress Notes (Signed)
Patient given discharge information by Helyn Numbers, PA with wife at bedside, all questions answered. Patient and wife packed all belongings assisted to car with the assistance of nurse tech.

## 2014-07-07 NOTE — Plan of Care (Signed)
Problem: RH PAIN MANAGEMENT Goal: RH STG PAIN MANAGED AT OR BELOW PT'S PAIN GOAL <5  Outcome: Completed/Met Date Met:  07/07/14

## 2014-07-07 NOTE — Plan of Care (Signed)
Problem: RH SKIN INTEGRITY Goal: RH STG ABLE TO PERFORM INCISION/WOUND CARE W/ASSISTANCE STG Able To Perform Incision/Wound Care With total assist of caregiver.  Outcome: Progressing

## 2014-07-07 NOTE — Plan of Care (Signed)
Problem: RH SAFETY Goal: RH STG ADHERE TO SAFETY PRECAUTIONS W/ASSISTANCE/DEVICE STG Adhere to Safety Precautions With Assistance/Device. supervision  Outcome: Progressing

## 2014-07-07 NOTE — Plan of Care (Signed)
Problem: RH PAIN MANAGEMENT Goal: RH STG PAIN MANAGED AT OR BELOW PT'S PAIN GOAL <5  Outcome: Progressing

## 2014-07-07 NOTE — Progress Notes (Signed)
[name]     PROGRESS NOTE    Subjective/Complaints: Anxious about numerous dispo issues. Concerned that bed is too high. Otherwise doing well. A  review of systems has been performed and if not noted above is otherwise negative.   Objective: Vital Signs: Blood pressure 116/55, pulse 71, temperature 98 F (36.7 C), temperature source Oral, resp. rate 18, weight 74 kg (163 lb 2.3 oz), SpO2 100 %. No results found. No results for input(s): WBC, HGB, HCT, PLT in the last 72 hours. No results for input(s): NA, K, CL, GLUCOSE, BUN, CREATININE, CALCIUM in the last 72 hours.  Invalid input(s): CO CBG (last 3)  No results for input(s): GLUCAP in the last 72 hours.  Wt Readings from Last 3 Encounters:  07/05/14 74 kg (163 lb 2.3 oz)  06/17/14 73.3 kg (161 lb 9.6 oz)  06/15/14 74.844 kg (165 lb)    Physical Exam:  HENT: dentition good Head: Normocephalic.  Right Ear: External ear normal.  Left Ear: External ear normal.  Eyes: Conjunctivae and EOM are normal. Pupils are equal, round, and reactive to light. Right eye exhibits no discharge. Left eye exhibits no discharge.  Neck: Normal range of motion. Neck supple. No JVD present. No tracheal deviation present. No thyromegaly present.  Cardiovascular: Normal rate, regular rhythm and normal heart sounds.  Respiratory: Effort normal and breath sounds normal. No respiratory distress. He has no wheezes. He has no rales. He exhibits no tenderness.  GI: Soft. Bowel sounds are normal. He exhibits no distension. There is no tenderness. There is no rebound.  Musculoskeletal:  Head forward posture.   Lymphadenopathy:   He has no cervical adenopathy.  Neurological: He is alert and oriented to person, place, and time.  Follows commands. No cn findings. Left shoulder limited due to pain/ tightness. Otherwise UE 5/5. LE: HF 4-5. KE 4/5, ADF/APF 4/5. Decreased LT/proprioception in both feet. No resting  tone.  Skin: Skin is warm.  Back incision is clean and intact--sutures out  dry. No erythema Psychiatric: He has a normal mood and affect. His behavior is normal. Judgment and thought content normal  Assessment/Plan: 1. 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 Physiatrist is providing close team supervision and 24 hour management of active medical problems listed below. Physiatrist and rehab team continue to assess barriers to discharge/monitor patient progress toward functional and medical goals.  Dc today. Follow up arranged.   FIM: FIM - Bathing Bathing Steps Patient Completed: Chest, Right Arm, Left Arm, Abdomen, Front perineal area, Buttocks, Right upper leg, Left upper leg, Right lower leg (including foot), Left lower leg (including foot) Bathing: 6: Assistive device (Comment)  FIM - Upper Body Dressing/Undressing Upper body dressing/undressing steps patient completed: Thread/unthread right sleeve of pullover shirt/dresss, Thread/unthread left sleeve of pullover shirt/dress, Put head through opening of pull over shirt/dress, Pull shirt over trunk Upper body dressing/undressing: 6: More than reasonable amount of time FIM - Lower Body Dressing/Undressing Lower body dressing/undressing steps patient completed: Thread/unthread right underwear leg, Thread/unthread left underwear leg, Pull underwear up/down, Thread/unthread right pants leg, Thread/unthread left pants leg, Pull pants up/down, Fasten/unfasten pants, Don/Doff right sock, Don/Doff left sock, Don/Doff right shoe, Don/Doff left shoe Lower body dressing/undressing: 6: Assistive device (Comment)  FIM - Toileting Toileting steps completed by patient: Adjust clothing prior to toileting, Performs perineal hygiene, Adjust clothing after toileting  Toileting Assistive Devices:  Grab bar or rail for support Toileting: 6: More than reasonable amount of time  FIM - Radio producer Devices: Bedside commode Toilet Transfers: 6-To toilet/ BSC, 6-From toilet/BSC  FIM - Control and instrumentation engineer Devices: Copy: 6: Supine > Sit: No assist, 6: Sit > Supine: No assist, 6: Bed > Chair or W/C: No assist, 6: Chair or W/C > Bed: No assist  FIM - Locomotion: Wheelchair Distance: 300 Locomotion: Wheelchair: 6: Travels 150 ft or more, turns around, maneuvers to table, bed or toilet, negotiates 3% grade: maneuvers on rugs and over door sills independently FIM - Locomotion: Ambulation Locomotion: Ambulation Assistive Devices: Administrator Ambulation/Gait Assistance: 5: Supervision Locomotion: Ambulation: 2: Travels 50 - 149 ft with supervision/safety issues  Comprehension Comprehension Mode: Auditory Comprehension: 7-Follows complex conversation/direction: With no assist  Expression Expression Mode: Verbal Expression: 7-Expresses complex ideas: With no assist  Social Interaction Social Interaction: 7-Interacts appropriately with others - No medications needed.  Problem Solving Problem Solving: 6-Solves complex problems: With extra time  Memory Memory: 7-Complete Independence: No helper  Medical Problem List and Plan: 1. Functional deficits secondary to multiple myeloma to thoracic spine status post decompression and fusion T4-7 06/18/2014  -sutures out   -wounds looks great 2. DVT Prophylaxis/Anticoagulation: SCDs. 3. Pain Management: Hydrocodone and Robaxin as needed. Monitor with increased mobility  -good posture/ROM 4. Hypothyroidism. Synthroid 5. Neuropsych: This patient is capable of making decisions on his own behalf. 6. Skin/Wound Care: routine skin checks 7. Fluids/Electrolytes/Nutrition: strict I and O's.   -labs normal this week 8.BPH/history of prostate cancer. Flomax 0.4 mg  daily at bedtime. Emptying bladder without issues 9. Bowels: emptied over weekend 10. ABLA: 10.3 hgb 11. Onc: path + for MM.     -pt has appt scheduled for next week with Dr. Julien Nordmann   LOS (Days) [place] TO FACE EVALUATION WAS PERFORMED  Franz Svec T 07/07/2014 7:49 AM

## 2014-07-07 NOTE — Plan of Care (Signed)
Problem: RH BOWEL ELIMINATION Goal: RH STG MANAGE BOWEL W/MEDICATION W/ASSISTANCE STG Manage Bowel with Medication withmin Assistance.  Outcome: Completed/Met Date Met:  07/07/14

## 2014-07-07 NOTE — Telephone Encounter (Signed)
s.w. pt and advised on Dec 10th appt....pt wanted to keep....added lab...pt ok and aware

## 2014-07-09 DIAGNOSIS — C412 Malignant neoplasm of vertebral column: Secondary | ICD-10-CM | POA: Diagnosis not present

## 2014-07-09 DIAGNOSIS — C9000 Multiple myeloma not having achieved remission: Secondary | ICD-10-CM | POA: Diagnosis not present

## 2014-07-09 DIAGNOSIS — G839 Paralytic syndrome, unspecified: Secondary | ICD-10-CM | POA: Diagnosis not present

## 2014-07-09 DIAGNOSIS — Z8546 Personal history of malignant neoplasm of prostate: Secondary | ICD-10-CM | POA: Diagnosis not present

## 2014-07-09 DIAGNOSIS — N400 Enlarged prostate without lower urinary tract symptoms: Secondary | ICD-10-CM | POA: Diagnosis not present

## 2014-07-09 DIAGNOSIS — M4854XS Collapsed vertebra, not elsewhere classified, thoracic region, sequela of fracture: Secondary | ICD-10-CM | POA: Diagnosis not present

## 2014-07-09 DIAGNOSIS — Z483 Aftercare following surgery for neoplasm: Secondary | ICD-10-CM | POA: Diagnosis not present

## 2014-07-11 DIAGNOSIS — N400 Enlarged prostate without lower urinary tract symptoms: Secondary | ICD-10-CM | POA: Diagnosis not present

## 2014-07-11 DIAGNOSIS — M4854XS Collapsed vertebra, not elsewhere classified, thoracic region, sequela of fracture: Secondary | ICD-10-CM | POA: Diagnosis not present

## 2014-07-11 DIAGNOSIS — G839 Paralytic syndrome, unspecified: Secondary | ICD-10-CM | POA: Diagnosis not present

## 2014-07-11 DIAGNOSIS — C412 Malignant neoplasm of vertebral column: Secondary | ICD-10-CM | POA: Diagnosis not present

## 2014-07-11 DIAGNOSIS — Z483 Aftercare following surgery for neoplasm: Secondary | ICD-10-CM | POA: Diagnosis not present

## 2014-07-11 DIAGNOSIS — C9000 Multiple myeloma not having achieved remission: Secondary | ICD-10-CM | POA: Diagnosis not present

## 2014-07-12 DIAGNOSIS — C412 Malignant neoplasm of vertebral column: Secondary | ICD-10-CM | POA: Diagnosis not present

## 2014-07-12 DIAGNOSIS — C9000 Multiple myeloma not having achieved remission: Secondary | ICD-10-CM | POA: Diagnosis not present

## 2014-07-12 DIAGNOSIS — N400 Enlarged prostate without lower urinary tract symptoms: Secondary | ICD-10-CM | POA: Diagnosis not present

## 2014-07-12 DIAGNOSIS — M4854XS Collapsed vertebra, not elsewhere classified, thoracic region, sequela of fracture: Secondary | ICD-10-CM | POA: Diagnosis not present

## 2014-07-12 DIAGNOSIS — Z483 Aftercare following surgery for neoplasm: Secondary | ICD-10-CM | POA: Diagnosis not present

## 2014-07-12 DIAGNOSIS — G839 Paralytic syndrome, unspecified: Secondary | ICD-10-CM | POA: Diagnosis not present

## 2014-07-13 ENCOUNTER — Telehealth: Payer: Self-pay | Admitting: Internal Medicine

## 2014-07-13 ENCOUNTER — Encounter: Payer: Self-pay | Admitting: Internal Medicine

## 2014-07-13 ENCOUNTER — Telehealth: Payer: Self-pay | Admitting: *Deleted

## 2014-07-13 ENCOUNTER — Ambulatory Visit (HOSPITAL_BASED_OUTPATIENT_CLINIC_OR_DEPARTMENT_OTHER): Payer: Medicare Other | Admitting: Internal Medicine

## 2014-07-13 ENCOUNTER — Encounter: Payer: Self-pay | Admitting: *Deleted

## 2014-07-13 ENCOUNTER — Other Ambulatory Visit (HOSPITAL_BASED_OUTPATIENT_CLINIC_OR_DEPARTMENT_OTHER): Payer: Medicare Other

## 2014-07-13 ENCOUNTER — Other Ambulatory Visit: Payer: Medicare Other

## 2014-07-13 VITALS — BP 113/57 | HR 67 | Temp 98.0°F | Resp 17 | Ht 69.0 in | Wt 164.3 lb

## 2014-07-13 DIAGNOSIS — C9000 Multiple myeloma not having achieved remission: Secondary | ICD-10-CM

## 2014-07-13 DIAGNOSIS — M4854XS Collapsed vertebra, not elsewhere classified, thoracic region, sequela of fracture: Secondary | ICD-10-CM | POA: Diagnosis not present

## 2014-07-13 DIAGNOSIS — C9002 Multiple myeloma in relapse: Secondary | ICD-10-CM

## 2014-07-13 DIAGNOSIS — Z483 Aftercare following surgery for neoplasm: Secondary | ICD-10-CM | POA: Diagnosis not present

## 2014-07-13 DIAGNOSIS — C801 Malignant (primary) neoplasm, unspecified: Secondary | ICD-10-CM

## 2014-07-13 DIAGNOSIS — G839 Paralytic syndrome, unspecified: Secondary | ICD-10-CM | POA: Diagnosis not present

## 2014-07-13 DIAGNOSIS — N400 Enlarged prostate without lower urinary tract symptoms: Secondary | ICD-10-CM | POA: Diagnosis not present

## 2014-07-13 DIAGNOSIS — C412 Malignant neoplasm of vertebral column: Secondary | ICD-10-CM | POA: Diagnosis not present

## 2014-07-13 LAB — CBC WITH DIFFERENTIAL/PLATELET
BASO%: 0.2 % (ref 0.0–2.0)
Basophils Absolute: 0 10*3/uL (ref 0.0–0.1)
EOS%: 3.6 % (ref 0.0–7.0)
Eosinophils Absolute: 0.2 10*3/uL (ref 0.0–0.5)
HCT: 33.3 % — ABNORMAL LOW (ref 38.4–49.9)
HGB: 10.9 g/dL — ABNORMAL LOW (ref 13.0–17.1)
LYMPH#: 0.9 10*3/uL (ref 0.9–3.3)
LYMPH%: 18.8 % (ref 14.0–49.0)
MCH: 32.7 pg (ref 27.2–33.4)
MCHC: 32.9 g/dL (ref 32.0–36.0)
MCV: 99.5 fL — ABNORMAL HIGH (ref 79.3–98.0)
MONO#: 0.4 10*3/uL (ref 0.1–0.9)
MONO%: 8.7 % (ref 0.0–14.0)
NEUT#: 3.1 10*3/uL (ref 1.5–6.5)
NEUT%: 68.7 % (ref 39.0–75.0)
Platelets: 179 10*3/uL (ref 140–400)
RBC: 3.34 10*6/uL — ABNORMAL LOW (ref 4.20–5.82)
RDW: 13.4 % (ref 11.0–14.6)
WBC: 4.6 10*3/uL (ref 4.0–10.3)

## 2014-07-13 LAB — COMPREHENSIVE METABOLIC PANEL (CC13)
ALK PHOS: 84 U/L (ref 40–150)
ALT: 19 U/L (ref 0–55)
ANION GAP: 9 meq/L (ref 3–11)
AST: 21 U/L (ref 5–34)
Albumin: 3.3 g/dL — ABNORMAL LOW (ref 3.5–5.0)
BUN: 17.3 mg/dL (ref 7.0–26.0)
CO2: 27 meq/L (ref 22–29)
Calcium: 9 mg/dL (ref 8.4–10.4)
Chloride: 101 mEq/L (ref 98–109)
Creatinine: 1 mg/dL (ref 0.7–1.3)
EGFR: 71 mL/min/{1.73_m2} — AB (ref >90)
GLUCOSE: 120 mg/dL (ref 70–140)
Potassium: 4.3 mEq/L (ref 3.5–5.1)
SODIUM: 137 meq/L (ref 136–145)
TOTAL PROTEIN: 7.3 g/dL (ref 6.4–8.3)
Total Bilirubin: 0.39 mg/dL (ref 0.20–1.20)

## 2014-07-13 MED ORDER — DEXAMETHASONE 4 MG PO TABS: ORAL_TABLET | ORAL | Status: DC

## 2014-07-13 MED ORDER — ACYCLOVIR 400 MG PO TABS: 400.0000 mg | ORAL_TABLET | Freq: Two times a day (BID) | ORAL | Status: DC

## 2014-07-13 MED ORDER — PROCHLORPERAZINE MALEATE 10 MG PO TABS: 10.0000 mg | ORAL_TABLET | Freq: Four times a day (QID) | ORAL | Status: DC | PRN

## 2014-07-13 NOTE — Telephone Encounter (Signed)
, °

## 2014-07-13 NOTE — Telephone Encounter (Signed)
Per staff message and POF I have scheduled appts. Advised scheduler of appts. JMW  

## 2014-07-13 NOTE — Progress Notes (Signed)
[place] [phone]   Fax:([phone]) [employer], [address] [place] [address]  DIAGNOSIS: Multiple myeloma diagnosed in November 2015  PRIOR THERAPY: Status post Thoracic five laminectomy for epidural tumor resection, Thoracic 4-7 posterior lateral arthrodesis,  segmental pedicle screw fixation T4-T7 Globus instrumentation under the care of Dr. Christella Noa on 06/18/2014.  CURRENT THERAPY: Systemic chemotherapy with Velcade 1.3 MG/M2 subcutaneously weekly in addition to Decadron 40 mg by mouth on weekly basis.   INTERVAL HISTORY: Jeremiah Owens 80 y.o. male returns to the clinic today for follow-up visit accompanied by his wife. 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 He underwent CT guided bone marrow biopsy and aspirate during his hospitalization that showed 10% plasma cells consistent with plasma cell dyscrasia. Imaging studies including skeletal bone survey on 06/22/2014 showed no lytic lesions identified in the Rose E T5 compression fracture status post posterior fixation. Quantitative immunoglobulin during his hospitalizations showed IgG of 2100, IgA 54 and IgM 35. Free Light Chain was 9.76, Free Lambda Light Chain 0.57 with a Kappa/Lambda Ratio of 17.12. The patient is spent more than 2 weeks in inpatient rehabilitation at [facility]. He was discharged last week and he is here today for evaluation and discussion of his treatment options. He is feeling fine today was no specific complaints except for the weakness in the lower extremity especially the left foot. He denied having any significant fever or chills, no nausea or vomiting. He denied having any significant chest pain, shortness breath, cough or hemoptysis.  MEDICAL HISTORY: Past Medical History  Diagnosis Date  . Compression  fracture   . Prostate cancer   . Melanoma     ALLERGIES:  is allergic to iodine.  MEDICATIONS:  Current Outpatient Prescriptions  Medication Sig Dispense Refill  . cholecalciferol (VITAMIN D) 1000 UNITS tablet Take 1,000 Units by mouth at bedtime.     . Glucosamine-Chondroit-Vit C-Mn (GLUCOSAMINE 1500 COMPLEX PO) Take 2 tablets by mouth daily after breakfast.     . HYDROcodone-acetaminophen (NORCO/VICODIN) 5-325 MG per tablet Take 1-2 tablets by mouth every 4 (four) hours as needed for moderate pain. 90 tablet 0  . HYDROCORTISONE, TOPICAL, 2.5 % SOLN Apply 1 application topically daily as needed.     Marland Kitchen levothyroxine (SYNTHROID, LEVOTHROID) 25 MCG tablet Take 1 tablet (25 mcg total) by mouth daily. 30 tablet 4  . metroNIDAZOLE (METROCREAM) 0.75 % cream Apply 1 application topically See admin instructions. Every time pt washes face, he put a small amount on his nose.    . Multiple Vitamins-Minerals (CENTRUM SILVER PO) Take 1 tablet by mouth daily.    Marland Kitchen omeprazole (PRILOSEC) 40 MG capsule Take 40 mg by mouth daily. In the afternoon  11  . Polyvinyl Alcohol-Povidone (REFRESH OP) Apply 1 drop to eye daily as needed (dry/irritated eyes).    . ranitidine (ZANTAC) 150 MG tablet Take 150 mg by mouth at bedtime.   11  . tamsulosin (FLOMAX) 0.4 MG CAPS capsule Take 1 capsule (0.4 mg total) by mouth at bedtime. 30 capsule 3  . celecoxib (CELEBREX) 200 MG capsule Take 200 mg by mouth every other day.     . methocarbamol (ROBAXIN) 500 MG tablet Take 1 tablet (500 mg total) by mouth every 6 (six) hours as needed for muscle spasms. (Patient not taking: Reported on 07/13/2014) 60  tablet 0   No current facility-administered medications for this visit.    SURGICAL HISTORY:  Past Surgical History  Procedure Laterality Date  . Hernia repair    . Posterior cervical fusion/foraminotomy N/A 06/17/2014    Procedure: Thoracic five laminectomy for epidural tumor resection Thoracic 4-7 posterior lateral  arthrodesis, segmental pedicle screw fixation.;  Surgeon: Ashok Pall, MD;  Location: [facility];  Service: Neurosurgery;  Laterality: N/A;    REVIEW OF SYSTEMS:  Constitutional: positive for fatigue Eyes: negative Ears, nose, mouth, throat, and face: negative Respiratory: negative Cardiovascular: negative Gastrointestinal: negative Genitourinary:negative Integument/breast: negative Hematologic/lymphatic: negative Musculoskeletal:positive for muscle weakness Neurological: positive for weakness Behavioral/Psych: negative Endocrine: negative Allergic/Immunologic: negative   PHYSICAL EXAMINATION: General appearance: alert, cooperative, fatigued and no distress Head: Normocephalic, without obvious abnormality, atraumatic Neck: no adenopathy, no JVD, supple, symmetrical, trachea midline and thyroid not enlarged, symmetric, no tenderness/mass/nodules Lymph nodes: Cervical, supraclavicular, and axillary nodes normal. Resp: clear to auscultation bilaterally Back: symmetric, no curvature. ROM normal. No CVA tenderness. Cardio: regular rate and rhythm, S1, S2 normal, no murmur, click, rub or gallop GI: soft, non-tender; bowel sounds normal; no masses,  no organomegaly Extremities: extremities normal, atraumatic, no cyanosis or edema Neurologic: Motor: Weakness in the left foot  ECOG PERFORMANCE STATUS: 1 - Symptomatic but completely ambulatory  Blood pressure 113/57, pulse 67, temperature 98 F (36.7 C), temperature source Oral, resp. rate 17, height _0  (1.753 m), weight 164 lb 4.8 oz (74.526 kg), SpO2 98 %.  LABORATORY DATA: Lab Results  Component Value Date   WBC 4.6 07/13/2014   HGB 10.9* 07/13/2014   HCT 33.3* 07/13/2014   MCV 99.5* 07/13/2014   PLT 179 07/13/2014      Chemistry      Component Value Date/Time   NA 137 07/13/2014 0844   NA 133* 07/04/2014 0430   K 4.3 07/13/2014 0844   K 4.1 07/04/2014 0430   CL 97 07/04/2014 0430   CO2 27 07/13/2014 0844   CO2 27  07/04/2014 0430   BUN 17.3 07/13/2014 0844   BUN 18 07/04/2014 0430   CREATININE 1.0 07/13/2014 0844   CREATININE 0.90 07/04/2014 0430      Component Value Date/Time   CALCIUM 9.0 07/13/2014 0844   CALCIUM 8.9 07/04/2014 0430   ALKPHOS 84 07/13/2014 0844   ALKPHOS 47 06/21/2014 0406   AST 21 07/13/2014 0844   AST 31 06/21/2014 0406   ALT 19 07/13/2014 0844   ALT 29 06/21/2014 0406   BILITOT 0.39 07/13/2014 0844   BILITOT <0.2* 06/21/2014 0406       RADIOGRAPHIC STUDIES: Dg Chest 2 View  06/17/2014   CLINICAL DATA:  Shortness of breath, upper chest pain  EXAM: CHEST  2 VIEW  COMPARISON:  01/02/2014  FINDINGS: Cardiomediastinal silhouette is stable. No acute infiltrate or pleural effusion. No pulmonary edema. Mild basilar atelectasis. Mild degenerative changes thoracic spine.  IMPRESSION: No acute infiltrate or pulmonary edema.  Mild basilar atelectasis.   Electronically Signed   By: Lahoma Crocker M.D.   On: 06/17/2014 18:04   Dg Thoracic Spine 2 View  06/18/2014   CLINICAL DATA:  Thoracic fusion  EXAM: DG C-ARM 61-120 MIN; THORACIC SPINE - 2 VIEW  FLUOROSCOPY TIME:  57 seconds  COMPARISON:  None  FINDINGS: Three level upper posterior thoracic spine fusion. There is thoracic spine spondylosis.  IMPRESSION: Three level upper posterior thoracic spine fusion.   Electronically Signed   By: Kathreen Devoid   On: 06/18/2014  04:10   Ct Angio Chest Pe W/cm &/or Wo Cm  06/17/2014   CLINICAL DATA:  Chest pain shortness of breath concern for pulmonary embolism initial evaluation ; Personal history of malignant melanoma, with metastatic disease currently involving thoracic spine  EXAM: CT ANGIOGRAPHY CHEST WITH CONTRAST  TECHNIQUE: Multidetector CT imaging of the chest was performed using the standard protocol during bolus administration of intravenous contrast. Multiplanar CT image reconstructions and MIPs were obtained to evaluate the vascular anatomy.  CONTRAST:  136m OMNIPAQUE IOHEXOL 350 MG/ML SOLN   COMPARISON:  06/17/2014, 06/08/2014  FINDINGS: Parietal filling defects in the pulmonary arterial system. Thoracic aorta shows no evidence of dissection or dilatation. Numerous small to borderline mediastinal lymph nodes and hilar lymph nodes, nonspecific. No significant pleural or pericardial effusion. Moderate bilateral dependent atelectasis. Known moderate T5 compression deformity with extensive lytic change consistent with metastasis. No other acute musculoskeletal findings.  Partially visualized low-attenuation lesion upper pole right kidney with average attenuation value of 0 most consistent with a partially visualized cyst measuring between 2 and 3 cm.  Review of the MIP images confirms the above findings.  IMPRESSION: No evidence of pulmonary arterial embolism.  Known lytic metastatic lesion T5 vertebral body with compression deformity   Electronically Signed   By: RSkipper ClicheM.D.   On: 06/17/2014 19:16   Ct Biopsy  06/23/2014   CLINICAL DATA:  New diagnosis multiple myeloma and status post resection of T5 plasmacytoma. The patient requires bone marrow biopsy for further work-up of myeloma.  EXAM: CT GUIDED BONE MARROW BIOPSY  ANESTHESIA/SEDATION: Versed 0.5 mg IV, Fentanyl 25 mcg IV  Total Moderate Sedation Time  13 minutes.  PROCEDURE: The procedure risks, benefits, and alternatives were explained to the patient. Questions regarding the procedure were encouraged and answered. The patient understands and consents to the procedure.  The right gluteal region was prepped with Betadine. Sterile gown and sterile gloves were used for the procedure. Local anesthesia was provided with 1% Lidocaine.  Under CT guidance, an 11 gauge OnControl bone cutting needle was advanced from a posterior approach into the right iliac bone. Needle positioning was confirmed with CT. Initial non heparinized and heparinized aspirate samples were obtained of bone marrow.  Core biopsy was performed with the OnControl needle.   COMPLICATIONS: None  FINDINGS: Inspection of initial aspirate did reveal visible particles. Intact core biopsy sample was obtained.  IMPRESSION: CT guided bone marrow biopsy of right posterior iliac bone with both aspirate and core samples obtained.   Electronically Signed   By: GAletta EdouardM.D.   On: 06/23/2014 10:12   Dg Bone Survey Met  06/22/2014   CLINICAL DATA:  Melanoma.  Prostate cancer.  EXAM: METASTATIC BONE SURVEY  COMPARISON:  None.  FINDINGS: No lytic bone lesions identified within the calvarium. There are no suspicious lytic lesions identified involving the upper extremities. There are no suspicious lytic lesions involving the cervical spine. There is no plain film abnormality within the lower cervical spine to correspond with the known MRI demonstrated bone metastasis. T5 compression fracture is again noted. The known tumor within the T5 vertebra is much better seen on the MRI from 06/08/2014. The MRI proven lesion involving the T10 and T12 vertebra are not well seen on today's exam. No lesions identified within the lumbar spine. There are no aggressive lytic lesions identified within the visualize bony pelvis or lower extremities.  IMPRESSION: 1. No lytic lesions identified. The MRI and CT identified spinal lesions are poorly identified  on plain film radiographs. 2. T5 compression fracture status post posterior fixation.   Electronically Signed   By: Kerby Moors M.D.   On: 06/22/2014 12:31   Dg C-arm 61-120 Min  06/18/2014   CLINICAL DATA:  Thoracic fusion  EXAM: DG C-ARM 61-120 MIN; THORACIC SPINE - 2 VIEW  FLUOROSCOPY TIME:  57 seconds  COMPARISON:  None  FINDINGS: Three level upper posterior thoracic spine fusion. There is thoracic spine spondylosis.  IMPRESSION: Three level upper posterior thoracic spine fusion.   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 I had a lengthy discussion with the patient and his wife today about his current disease stage, prognosis and treatment options. I recommended for the patient treatment for the multiple myeloma and give him several options for this treatment including subcutaneous Velcade and weekly basis with Decadron versus Revlimid and Decadron or a combination of the 2 regimens with Velcade, Revlimid and Decadron. I discussed with the patient the adverse effects of this treatment including but not limited to mild alopecia, nausea and vomiting, peripheral neuropathy, myelosuppression, liver or renal dysfunction. The patient is interested in proceeding with the subcutaneous Velcade. He is expected to start the first cycle of this treatment next week. I will call his pharmacy with prescription for Decadron 40 mg by mouth on a weekly basis starting with the first dose of Velcade. I will also call the pharmacy with prescription for Compazine 10 mg by mouth every 6 hours as needed for nausea in addition to acyclovir 400 mg by mouth twice a day for zoster prophylaxis. He would have a chemotherapy education class before starting the first dose of his treatment. The patient will come back for follow-up visit in 3 weeks for reevaluation and management of any adverse effect of his treatment. He was advised to call immediately if he has any concerning symptoms in the interval. The patient voices understanding of current disease status and treatment options and is in agreement with the current care plan.  All questions were answered. The patient knows to call the clinic with any problems, questions or concerns. We can certainly see the patient much sooner if necessary.  I spent 20 minutes counseling the patient face to face. The total time spent in the appointment was 30 minutes.  Disclaimer: This note was  dictated with voice recognition software. Similar sounding words can inadvertently be transcribed and may not be corrected upon review.

## 2014-07-17 ENCOUNTER — Other Ambulatory Visit: Payer: Medicare Other

## 2014-07-18 DIAGNOSIS — Z483 Aftercare following surgery for neoplasm: Secondary | ICD-10-CM | POA: Diagnosis not present

## 2014-07-18 DIAGNOSIS — M4324 Fusion of spine, thoracic region: Secondary | ICD-10-CM | POA: Diagnosis not present

## 2014-07-18 DIAGNOSIS — G839 Paralytic syndrome, unspecified: Secondary | ICD-10-CM | POA: Diagnosis not present

## 2014-07-18 DIAGNOSIS — N400 Enlarged prostate without lower urinary tract symptoms: Secondary | ICD-10-CM | POA: Diagnosis not present

## 2014-07-18 DIAGNOSIS — C412 Malignant neoplasm of vertebral column: Secondary | ICD-10-CM | POA: Diagnosis not present

## 2014-07-18 DIAGNOSIS — C9000 Multiple myeloma not having achieved remission: Secondary | ICD-10-CM | POA: Diagnosis not present

## 2014-07-18 DIAGNOSIS — M4854XS Collapsed vertebra, not elsewhere classified, thoracic region, sequela of fracture: Secondary | ICD-10-CM | POA: Diagnosis not present

## 2014-07-19 ENCOUNTER — Other Ambulatory Visit (HOSPITAL_BASED_OUTPATIENT_CLINIC_OR_DEPARTMENT_OTHER): Payer: Medicare Other

## 2014-07-19 ENCOUNTER — Ambulatory Visit (HOSPITAL_BASED_OUTPATIENT_CLINIC_OR_DEPARTMENT_OTHER): Payer: Medicare Other

## 2014-07-19 DIAGNOSIS — C9000 Multiple myeloma not having achieved remission: Secondary | ICD-10-CM | POA: Diagnosis not present

## 2014-07-19 DIAGNOSIS — Z5112 Encounter for antineoplastic immunotherapy: Secondary | ICD-10-CM | POA: Diagnosis not present

## 2014-07-19 LAB — CBC WITH DIFFERENTIAL/PLATELET
BASO%: 0.3 % (ref 0.0–2.0)
Basophils Absolute: 0 10*3/uL (ref 0.0–0.1)
EOS%: 3.3 % (ref 0.0–7.0)
Eosinophils Absolute: 0.1 10*3/uL (ref 0.0–0.5)
HEMATOCRIT: 34.4 % — AB (ref 38.4–49.9)
HGB: 11.2 g/dL — ABNORMAL LOW (ref 13.0–17.1)
LYMPH#: 0.7 10*3/uL — AB (ref 0.9–3.3)
LYMPH%: 16.5 % (ref 14.0–49.0)
MCH: 32.1 pg (ref 27.2–33.4)
MCHC: 32.4 g/dL (ref 32.0–36.0)
MCV: 99.1 fL — ABNORMAL HIGH (ref 79.3–98.0)
MONO#: 0.2 10*3/uL (ref 0.1–0.9)
MONO%: 5.9 % (ref 0.0–14.0)
NEUT#: 3.1 10*3/uL (ref 1.5–6.5)
NEUT%: 74 % (ref 39.0–75.0)
Platelets: 179 10*3/uL (ref 140–400)
RBC: 3.47 10*6/uL — ABNORMAL LOW (ref 4.20–5.82)
RDW: 13.5 % (ref 11.0–14.6)
WBC: 4.2 10*3/uL (ref 4.0–10.3)

## 2014-07-19 LAB — COMPREHENSIVE METABOLIC PANEL (CC13)
ALBUMIN: 3.4 g/dL — AB (ref 3.5–5.0)
ALT: 15 U/L (ref 0–55)
AST: 21 U/L (ref 5–34)
Alkaline Phosphatase: 79 U/L (ref 40–150)
Anion Gap: 8 mEq/L (ref 3–11)
BUN: 14.9 mg/dL (ref 7.0–26.0)
CALCIUM: 8.9 mg/dL (ref 8.4–10.4)
CHLORIDE: 102 meq/L (ref 98–109)
CO2: 25 meq/L (ref 22–29)
Creatinine: 0.9 mg/dL (ref 0.7–1.3)
EGFR: 80 mL/min/{1.73_m2} — ABNORMAL LOW (ref >90)
Glucose: 101 mg/dl (ref 70–140)
POTASSIUM: 4.2 meq/L (ref 3.5–5.1)
Sodium: 136 mEq/L (ref 136–145)
TOTAL PROTEIN: 7.7 g/dL (ref 6.4–8.3)
Total Bilirubin: 0.32 mg/dL (ref 0.20–1.20)

## 2014-07-19 MED ORDER — BORTEZOMIB CHEMO SQ INJECTION 3.5 MG (2.5MG/ML)
1.3000 mg/m2 | Freq: Once | INTRAMUSCULAR | Status: AC
  Administered 2014-07-19: 2.5 mg via SUBCUTANEOUS
  Filled 2014-07-19: qty 2.5

## 2014-07-19 MED ORDER — ONDANSETRON HCL 8 MG PO TABS
8.0000 mg | ORAL_TABLET | Freq: Once | ORAL | Status: AC
  Administered 2014-07-19: 8 mg via ORAL

## 2014-07-19 MED ORDER — ONDANSETRON HCL 8 MG PO TABS
ORAL_TABLET | ORAL | Status: AC
  Filled 2014-07-19: qty 1

## 2014-07-19 NOTE — Patient Instructions (Addendum)
[facility] Discharge Instructions for Patients Receiving Chemotherapy  Today you received the following chemotherapy agents Velcade.  To help prevent nausea and vomiting after your treatment, we encourage you to take your nausea medication    If you develop nausea and vomiting that is not controlled by your nausea medication, call the clinic.   BELOW ARE SYMPTOMS THAT SHOULD BE REPORTED IMMEDIATELY:  *FEVER GREATER THAN 100.5 F  *CHILLS WITH OR WITHOUT FEVER  NAUSEA AND VOMITING THAT IS NOT CONTROLLED WITH YOUR NAUSEA MEDICATION  *UNUSUAL SHORTNESS OF BREATH  *UNUSUAL BRUISING OR BLEEDING  TENDERNESS IN MOUTH AND THROAT WITH OR WITHOUT PRESENCE OF ULCERS  *URINARY PROBLEMS  *BOWEL PROBLEMS  UNUSUAL RASH Items with * indicate a potential emergency and should be followed up as soon as possible.  Feel free to call the clinic you have any questions or concerns. The clinic phone number is [phone].   Bortezomib injection What is this medicine? BORTEZOMIB (bor TEZ oh mib) is a chemotherapy drug. It slows the growth of cancer cells. This medicine is used to treat multiple myeloma, and certain lymphomas, such as mantle-cell lymphoma. This medicine may be used for other purposes; ask your health care provider or pharmacist if you have questions. COMMON BRAND NAME(S): Velcade What should I tell my health care provider before I take this medicine? They need to know if you have any of these conditions: -diabetes -heart disease -irregular heartbeat -liver disease -on hemodialysis -low blood counts, like low white blood cells, platelets, or hemoglobin -peripheral neuropathy -taking medicine for blood pressure -an unusual or allergic reaction to bortezomib, mannitol, boron, other medicines, foods, dyes, or preservatives -pregnant or trying to get pregnant -breast-feeding How should I use this medicine? This medicine is for injection into a vein or for  injection under the skin. It is given by a health care professional in a hospital or clinic setting. Talk to your pediatrician regarding the use of this medicine in children. Special care may be needed. Overdosage: If you think you have taken too much of this medicine contact a poison control center or emergency room at once. NOTE: This medicine is only for you. Do not share this medicine with others. What if I miss a dose? It is important not to miss your dose. Call your doctor or health care professional if you are unable to keep an appointment. What may interact with this medicine? This medicine may interact with the following medications: -ketoconazole -rifampin -ritonavir -St. John's Wort This list may not describe all possible interactions. Give your health care provider a list of all the medicines, herbs, non-prescription drugs, or dietary supplements you use. Also tell them if you smoke, drink alcohol, or use illegal drugs. Some items may interact with your medicine. What should I watch for while using this medicine? Visit your doctor for checks on your progress. This drug may make you feel generally unwell. This is not uncommon, as chemotherapy can affect healthy cells as well as cancer cells. Report any side effects. Continue your course of treatment even though you feel ill unless your doctor tells you to stop. You may get drowsy or dizzy. Do not drive, use machinery, or do anything that needs mental alertness until you know how this medicine affects you. Do not stand or sit up quickly, especially if you are an older patient. This reduces the risk of dizzy or fainting spells. In some cases, you may be given additional medicines to help with side effects.   Follow all directions for their use. Call your doctor or health care professional for advice if you get a fever, chills or sore throat, or other symptoms of a cold or flu. Do not treat yourself. This drug decreases your body's ability to  fight infections. Try to avoid being around people who are sick. This medicine may increase your risk to bruise or bleed. Call your doctor or health care professional if you notice any unusual bleeding. You may need blood work done while you are taking this medicine. In some patients, this medicine may cause a serious brain infection that may cause death. If you have any problems seeing, thinking, speaking, walking, or standing, tell your doctor right away. If you cannot reach your doctor, urgently seek other source of medical care. Do not become pregnant while taking this medicine. Women should inform their doctor if they wish to become pregnant or think they might be pregnant. There is a potential for serious side effects to an unborn child. Talk to your health care professional or pharmacist for more information. Do not breast-feed an infant while taking this medicine. Check with your doctor or health care professional if you get an attack of severe diarrhea, nausea and vomiting, or if you sweat a lot. The loss of too much body fluid can make it dangerous for you to take this medicine. What side effects may I notice from receiving this medicine? Side effects that you should report to your doctor or health care professional as soon as possible: -allergic reactions like skin rash, itching or hives, swelling of the face, lips, or tongue -breathing problems -changes in hearing -changes in vision -fast, irregular heartbeat -feeling faint or lightheaded, falls -pain, tingling, numbness in the hands or feet -right upper belly pain -seizures -swelling of the ankles, feet, hands -unusual bleeding or bruising -unusually weak or tired -vomiting -yellowing of the eyes or skin Side effects that usually do not require medical attention (report to your doctor or health care professional if they continue or are bothersome): -changes in emotions or moods -constipation -diarrhea -loss of  appetite -headache -irritation at site where injected -nausea This list may not describe all possible side effects. Call your doctor for medical advice about side effects. You may report side effects to FDA at 1-800-FDA-1088. Where should I keep my medicine? This drug is given in a hospital or clinic and will not be stored at home. NOTE: This sheet is a summary. It may not cover all possible information. If you have questions about this medicine, talk to your doctor, pharmacist, or health care provider.  2015, Elsevier/Gold Standard. (2013-05-16 12:46:32)  

## 2014-07-20 ENCOUNTER — Telehealth: Payer: Self-pay | Admitting: *Deleted

## 2014-07-20 DIAGNOSIS — M4854XS Collapsed vertebra, not elsewhere classified, thoracic region, sequela of fracture: Secondary | ICD-10-CM | POA: Diagnosis not present

## 2014-07-20 DIAGNOSIS — C9000 Multiple myeloma not having achieved remission: Secondary | ICD-10-CM | POA: Diagnosis not present

## 2014-07-20 DIAGNOSIS — G839 Paralytic syndrome, unspecified: Secondary | ICD-10-CM | POA: Diagnosis not present

## 2014-07-20 DIAGNOSIS — N400 Enlarged prostate without lower urinary tract symptoms: Secondary | ICD-10-CM | POA: Diagnosis not present

## 2014-07-20 DIAGNOSIS — C412 Malignant neoplasm of vertebral column: Secondary | ICD-10-CM | POA: Diagnosis not present

## 2014-07-20 DIAGNOSIS — Z483 Aftercare following surgery for neoplasm: Secondary | ICD-10-CM | POA: Diagnosis not present

## 2014-07-20 NOTE — Telephone Encounter (Signed)
Called Jeremiah Owens for chemotherapy F/U.  Patient is doing well.  Denies n/v but "taking anti-emetic for the first two days as instructed.  I will stop after tomorrow because I don't think I need them."  Denies any new side effects or symptoms.  Bowel is functioning well and unable to hold bladder once today and doesn't know why.  Denies any pain with urination.  Occupational therapist working with him now.  Eating and drinking well and I instructed to drink 64 oz minimum daily or at least the day before, of and after treatment.  Denies questions at this time and encouraged to call if needed.  Reviewed how to call after hours in the case of an emergency.

## 2014-07-20 NOTE — Telephone Encounter (Signed)
-----   Message from [facility], [place] sent at 07/19/2014 11:32 AM EST ----- Regarding: "1st time Chemotherapy" Patient received SQ Velcade for the 1st time today, per Dr. Julien Nordmann. Tolerated treatment well.

## 2014-07-21 ENCOUNTER — Telehealth: Payer: Self-pay | Admitting: *Deleted

## 2014-07-21 ENCOUNTER — Encounter: Payer: Self-pay | Admitting: Radiation Oncology

## 2014-07-21 NOTE — Telephone Encounter (Signed)
Pt's wife called stating that at the injection site it is red approx 2 inches long and 0.5 inches wide.  Per Dr Vista Mink, no further orders at this time, continue to monitor and let us know if it worsens are becomes painful.  She verbalized understanding.

## 2014-07-24 DIAGNOSIS — Z483 Aftercare following surgery for neoplasm: Secondary | ICD-10-CM | POA: Diagnosis not present

## 2014-07-24 DIAGNOSIS — N400 Enlarged prostate without lower urinary tract symptoms: Secondary | ICD-10-CM | POA: Diagnosis not present

## 2014-07-24 DIAGNOSIS — G839 Paralytic syndrome, unspecified: Secondary | ICD-10-CM | POA: Diagnosis not present

## 2014-07-24 DIAGNOSIS — M4854XS Collapsed vertebra, not elsewhere classified, thoracic region, sequela of fracture: Secondary | ICD-10-CM | POA: Diagnosis not present

## 2014-07-24 DIAGNOSIS — C412 Malignant neoplasm of vertebral column: Secondary | ICD-10-CM | POA: Diagnosis not present

## 2014-07-24 DIAGNOSIS — C9000 Multiple myeloma not having achieved remission: Secondary | ICD-10-CM | POA: Diagnosis not present

## 2014-07-25 DIAGNOSIS — C9000 Multiple myeloma not having achieved remission: Secondary | ICD-10-CM | POA: Diagnosis not present

## 2014-07-25 DIAGNOSIS — G839 Paralytic syndrome, unspecified: Secondary | ICD-10-CM | POA: Diagnosis not present

## 2014-07-25 DIAGNOSIS — C412 Malignant neoplasm of vertebral column: Secondary | ICD-10-CM | POA: Diagnosis not present

## 2014-07-25 DIAGNOSIS — N400 Enlarged prostate without lower urinary tract symptoms: Secondary | ICD-10-CM | POA: Diagnosis not present

## 2014-07-25 DIAGNOSIS — M4854XS Collapsed vertebra, not elsewhere classified, thoracic region, sequela of fracture: Secondary | ICD-10-CM | POA: Diagnosis not present

## 2014-07-25 DIAGNOSIS — Z483 Aftercare following surgery for neoplasm: Secondary | ICD-10-CM | POA: Diagnosis not present

## 2014-07-26 ENCOUNTER — Ambulatory Visit (HOSPITAL_BASED_OUTPATIENT_CLINIC_OR_DEPARTMENT_OTHER): Payer: Medicare Other

## 2014-07-26 ENCOUNTER — Telehealth: Payer: Self-pay | Admitting: Oncology

## 2014-07-26 ENCOUNTER — Ambulatory Visit (HOSPITAL_BASED_OUTPATIENT_CLINIC_OR_DEPARTMENT_OTHER): Payer: Medicare Other | Admitting: Lab

## 2014-07-26 ENCOUNTER — Other Ambulatory Visit: Payer: Self-pay | Admitting: *Deleted

## 2014-07-26 ENCOUNTER — Telehealth: Payer: Self-pay | Admitting: Medical Oncology

## 2014-07-26 ENCOUNTER — Encounter: Payer: Self-pay | Admitting: Radiation Oncology

## 2014-07-26 DIAGNOSIS — C9000 Multiple myeloma not having achieved remission: Secondary | ICD-10-CM | POA: Diagnosis not present

## 2014-07-26 DIAGNOSIS — Z5112 Encounter for antineoplastic immunotherapy: Secondary | ICD-10-CM | POA: Diagnosis not present

## 2014-07-26 DIAGNOSIS — C9002 Multiple myeloma in relapse: Secondary | ICD-10-CM

## 2014-07-26 LAB — COMPREHENSIVE METABOLIC PANEL (CC13)
ALT: 20 U/L (ref 0–55)
ANION GAP: 8 meq/L (ref 3–11)
AST: 21 U/L (ref 5–34)
Albumin: 3.3 g/dL — ABNORMAL LOW (ref 3.5–5.0)
Alkaline Phosphatase: 72 U/L (ref 40–150)
BILIRUBIN TOTAL: 0.44 mg/dL (ref 0.20–1.20)
BUN: 13 mg/dL (ref 7.0–26.0)
CALCIUM: 8.9 mg/dL (ref 8.4–10.4)
CHLORIDE: 101 meq/L (ref 98–109)
CO2: 28 mEq/L (ref 22–29)
CREATININE: 0.9 mg/dL (ref 0.7–1.3)
EGFR: 75 mL/min/{1.73_m2} — ABNORMAL LOW (ref >90)
Glucose: 123 mg/dl (ref 70–140)
Potassium: 3.9 mEq/L (ref 3.5–5.1)
Sodium: 137 mEq/L (ref 136–145)
TOTAL PROTEIN: 7.5 g/dL (ref 6.4–8.3)

## 2014-07-26 LAB — CBC WITH DIFFERENTIAL/PLATELET
BASO%: 0 % (ref 0.0–2.0)
Basophils Absolute: 0 10*3/uL (ref 0.0–0.1)
EOS ABS: 0.1 10*3/uL (ref 0.0–0.5)
EOS%: 2 % (ref 0.0–7.0)
HEMATOCRIT: 35.2 % — AB (ref 38.4–49.9)
HGB: 11.8 g/dL — ABNORMAL LOW (ref 13.0–17.1)
LYMPH#: 1 10*3/uL (ref 0.9–3.3)
LYMPH%: 19.8 % (ref 14.0–49.0)
MCH: 32.9 pg (ref 27.2–33.4)
MCHC: 33.5 g/dL (ref 32.0–36.0)
MCV: 98.1 fL — ABNORMAL HIGH (ref 79.3–98.0)
MONO#: 0.4 10*3/uL (ref 0.1–0.9)
MONO%: 8.7 % (ref 0.0–14.0)
NEUT%: 69.5 % (ref 39.0–75.0)
NEUTROS ABS: 3.5 10*3/uL (ref 1.5–6.5)
PLATELETS: 172 10*3/uL (ref 140–400)
RBC: 3.59 10*6/uL — AB (ref 4.20–5.82)
RDW: 13.7 % (ref 11.0–14.6)
WBC: 5.1 10*3/uL (ref 4.0–10.3)

## 2014-07-26 MED ORDER — ONDANSETRON HCL 8 MG PO TABS
ORAL_TABLET | ORAL | Status: AC
  Filled 2014-07-26: qty 1

## 2014-07-26 MED ORDER — ONDANSETRON HCL 8 MG PO TABS
8.0000 mg | ORAL_TABLET | Freq: Once | ORAL | Status: AC
  Administered 2014-07-26: 8 mg via ORAL

## 2014-07-26 MED ORDER — BORTEZOMIB CHEMO SQ INJECTION 3.5 MG (2.5MG/ML)
1.3000 mg/m2 | Freq: Once | INTRAMUSCULAR | Status: AC
  Administered 2014-07-26: 2.5 mg via SUBCUTANEOUS
  Filled 2014-07-26: qty 2.5

## 2014-07-26 NOTE — Telephone Encounter (Signed)
Per Chemo nurse pt confused why he got a message about a radiation appointment. I notified Dr Ida Rogue nurse.

## 2014-07-26 NOTE — Telephone Encounter (Signed)
Called and left a message for [facility] regarding his appointment with Dr. Lisbeth Renshaw on 08/07/14.  Requested a call back.  Also called Abelina Bachelor, RN and advised her that Dr. Lisbeth Renshaw would like to see Jeremiah Owens for post op radiation.

## 2014-07-26 NOTE — Progress Notes (Signed)
Histology and Location of Primary Cancer: Prostate metastatic to T-spine,  Follow up New Consult, for consideration palliative radiation  Sites of Visceral and Bony Metastatic Disease: T-5   Location(s) of Symptomatic Metastases:  Spine, T-5  Past/Anticipated chemotherapy by medical oncology, if any: Dr. Julien Nordmann consult 06/17/14,  , 07/13/14 Chemotherapy class, seen, 08/02/14 ;Velcade ,, newly Dx Nov 2015 with Multiple Myeloma  next appt with Dr. Lindi Adie 08/07/14   Pain on a scale of 0-10 is:  Upper mid back pain   If Spine Met(s), symptoms, if any, include:compression fracture   Bowel/Bladder retention or incontinence   Numbness or weakness in extremities  Difficulty walking,/balance =pathologic fracture T-5,cord comperession  Current Decadron regimen, if applicable:   Ambulatory status? Walker? Wheelchair?:w/c, uses walker at home,   SAFETY ISSUES: Yes  Prior radiation? Yes,  S/p external beam radiation and  Prostate seed implantation  2002, [place].  Pacemaker/ICD? NO   Is the patient on methotrexate?  No, but is taking Decadron weekly prior to chmotherapy  Current Complaints / other details: Married, 1 son, 2 grandchildren,   Seen in the ED by Dr. Lisbeth Renshaw 06/17/14 Hx Melanoma right neck, 1994, , Prostate cancer 2002,  Mother breast cancer, father died age 51 ,non smoker, drinks wine, no illicit drugs,  Hearing deficit,06/18/14 Thoracic 5  laminectomy for epidural tumor resection,Thoracic 4-7 posterior lateral arthrodesis,segmental pedicle screw fixation T4-T7 globus instrumentation, Dr. Cristina Gong,   Allergies:Iodine Pain soreness in chest with activity

## 2014-07-26 NOTE — Patient Instructions (Signed)
[facility] Discharge Instructions for Patients Receiving Chemotherapy  Today you received the following chemotherapy agents velcade   To help prevent nausea and vomiting after your treatment, we encourage you to take your nausea medication as directed  If you develop nausea and vomiting that is not controlled by your nausea medication, call the clinic.   BELOW ARE SYMPTOMS THAT SHOULD BE REPORTED IMMEDIATELY:  *FEVER GREATER THAN 100.5 F  *CHILLS WITH OR WITHOUT FEVER  NAUSEA AND VOMITING THAT IS NOT CONTROLLED WITH YOUR NAUSEA MEDICATION  *UNUSUAL SHORTNESS OF BREATH  *UNUSUAL BRUISING OR BLEEDING  TENDERNESS IN MOUTH AND THROAT WITH OR WITHOUT PRESENCE OF ULCERS  *URINARY PROBLEMS  *BOWEL PROBLEMS  UNUSUAL RASH Items with * indicate a potential emergency and should be followed up as soon as possible.  Feel free to call the clinic you have any questions or concerns. The clinic phone number is [phone].  

## 2014-07-27 ENCOUNTER — Telehealth: Payer: Self-pay | Admitting: Medical Oncology

## 2014-07-27 NOTE — Telephone Encounter (Signed)
Pt called- he forgot to take his steroid yesterday . i told him to take it now.

## 2014-08-02 ENCOUNTER — Other Ambulatory Visit (HOSPITAL_BASED_OUTPATIENT_CLINIC_OR_DEPARTMENT_OTHER): Payer: Medicare Other

## 2014-08-02 ENCOUNTER — Telehealth: Payer: Self-pay | Admitting: *Deleted

## 2014-08-02 ENCOUNTER — Encounter: Payer: Self-pay | Admitting: Physician Assistant

## 2014-08-02 ENCOUNTER — Telehealth: Payer: Self-pay | Admitting: Physician Assistant

## 2014-08-02 ENCOUNTER — Ambulatory Visit (HOSPITAL_BASED_OUTPATIENT_CLINIC_OR_DEPARTMENT_OTHER): Payer: Medicare Other | Admitting: Physician Assistant

## 2014-08-02 ENCOUNTER — Ambulatory Visit (HOSPITAL_BASED_OUTPATIENT_CLINIC_OR_DEPARTMENT_OTHER): Payer: Medicare Other

## 2014-08-02 VITALS — BP 111/54 | HR 79 | Temp 98.2°F | Resp 18 | Ht 69.0 in | Wt 160.8 lb

## 2014-08-02 DIAGNOSIS — C9000 Multiple myeloma not having achieved remission: Secondary | ICD-10-CM | POA: Diagnosis not present

## 2014-08-02 DIAGNOSIS — C412 Malignant neoplasm of vertebral column: Secondary | ICD-10-CM | POA: Diagnosis not present

## 2014-08-02 DIAGNOSIS — Z8546 Personal history of malignant neoplasm of prostate: Secondary | ICD-10-CM

## 2014-08-02 DIAGNOSIS — N400 Enlarged prostate without lower urinary tract symptoms: Secondary | ICD-10-CM | POA: Diagnosis not present

## 2014-08-02 DIAGNOSIS — Z5112 Encounter for antineoplastic immunotherapy: Secondary | ICD-10-CM

## 2014-08-02 DIAGNOSIS — C9002 Multiple myeloma in relapse: Secondary | ICD-10-CM

## 2014-08-02 DIAGNOSIS — G839 Paralytic syndrome, unspecified: Secondary | ICD-10-CM

## 2014-08-02 DIAGNOSIS — Z483 Aftercare following surgery for neoplasm: Secondary | ICD-10-CM | POA: Diagnosis not present

## 2014-08-02 DIAGNOSIS — M4854XS Collapsed vertebra, not elsewhere classified, thoracic region, sequela of fracture: Secondary | ICD-10-CM | POA: Diagnosis not present

## 2014-08-02 DIAGNOSIS — C7951 Secondary malignant neoplasm of bone: Secondary | ICD-10-CM

## 2014-08-02 LAB — COMPREHENSIVE METABOLIC PANEL (CC13)
ALK PHOS: 67 U/L (ref 40–150)
ALT: 17 U/L (ref 0–55)
ANION GAP: 6 meq/L (ref 3–11)
AST: 19 U/L (ref 5–34)
Albumin: 3.4 g/dL — ABNORMAL LOW (ref 3.5–5.0)
BILIRUBIN TOTAL: 0.65 mg/dL (ref 0.20–1.20)
BUN: 17.6 mg/dL (ref 7.0–26.0)
CALCIUM: 9.1 mg/dL (ref 8.4–10.4)
CO2: 29 mEq/L (ref 22–29)
Chloride: 101 mEq/L (ref 98–109)
Creatinine: 1 mg/dL (ref 0.7–1.3)
EGFR: 73 mL/min/{1.73_m2} — ABNORMAL LOW (ref >90)
Glucose: 132 mg/dl (ref 70–140)
POTASSIUM: 3.9 meq/L (ref 3.5–5.1)
SODIUM: 135 meq/L — AB (ref 136–145)
TOTAL PROTEIN: 7.7 g/dL (ref 6.4–8.3)

## 2014-08-02 LAB — CBC WITH DIFFERENTIAL/PLATELET
BASO%: 0.2 % (ref 0.0–2.0)
Basophils Absolute: 0 10*3/uL (ref 0.0–0.1)
EOS%: 2.5 % (ref 0.0–7.0)
Eosinophils Absolute: 0.1 10*3/uL (ref 0.0–0.5)
HCT: 37.3 % — ABNORMAL LOW (ref 38.4–49.9)
HGB: 12.3 g/dL — ABNORMAL LOW (ref 13.0–17.1)
LYMPH#: 0.9 10*3/uL (ref 0.9–3.3)
LYMPH%: 17.3 % (ref 14.0–49.0)
MCH: 32.8 pg (ref 27.2–33.4)
MCHC: 32.9 g/dL (ref 32.0–36.0)
MCV: 99.8 fL — ABNORMAL HIGH (ref 79.3–98.0)
MONO#: 0.4 10*3/uL (ref 0.1–0.9)
MONO%: 7 % (ref 0.0–14.0)
NEUT#: 4 10*3/uL (ref 1.5–6.5)
NEUT%: 73 % (ref 39.0–75.0)
Platelets: 172 10*3/uL (ref 140–400)
RBC: 3.73 10*6/uL — ABNORMAL LOW (ref 4.20–5.82)
RDW: 13.8 % (ref 11.0–14.6)
WBC: 5.4 10*3/uL (ref 4.0–10.3)

## 2014-08-02 MED ORDER — ONDANSETRON HCL 8 MG PO TABS
ORAL_TABLET | ORAL | Status: AC
  Filled 2014-08-02: qty 1

## 2014-08-02 MED ORDER — BORTEZOMIB CHEMO SQ INJECTION 3.5 MG (2.5MG/ML)
1.3000 mg/m2 | Freq: Once | INTRAMUSCULAR | Status: AC
  Administered 2014-08-02: 2.5 mg via SUBCUTANEOUS
  Filled 2014-08-02: qty 2.5

## 2014-08-02 MED ORDER — ONDANSETRON HCL 8 MG PO TABS
8.0000 mg | ORAL_TABLET | Freq: Once | ORAL | Status: AC
  Administered 2014-08-02: 8 mg via ORAL

## 2014-08-02 NOTE — Telephone Encounter (Signed)
Pt confirmed labs/ov per 12/30 POF, gave pt AVS.... KJ, sent msg to add chemo °

## 2014-08-02 NOTE — Telephone Encounter (Signed)
Per staff message and POF I have scheduled appts. Advised scheduler of appts. JMW  

## 2014-08-02 NOTE — Progress Notes (Addendum)
[facility] [phone]) [phone]   Fax:([phone]) [employer], [address] [place] [address]  DIAGNOSIS: Multiple myeloma diagnosed in November 2015  PRIOR THERAPY: Status post Thoracic five laminectomy for epidural tumor resection, Thoracic 4-7 posterior lateral arthrodesis,  segmental pedicle screw fixation T4-T7 Globus instrumentation under the care of Dr. Christella Noa on 06/18/2014.  CURRENT THERAPY: Systemic chemotherapy with Velcade 1.3 MG/M2 subcutaneously weekly in addition to Decadron 40 mg by mouth on weekly basis. Therapy beginning 07/19/2014.  INTERVAL HISTORY: Jeremiah Owens 80 y.o. male returns to the clinic today for follow-up visit accompanied by his wife. 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 He underwent CT guided bone marrow biopsy and aspirate during his hospitalization that showed 10% plasma cells consistent with plasma cell dyscrasia. Imaging studies including skeletal bone survey on 06/22/2014 showed no lytic lesions identified in the Rose E T5 compression fracture status post posterior fixation. Quantitative immunoglobulin during his hospitalizations showed IgG of 2100, IgA 54 and IgM 35. Free Light Chain was 9.76, Free Lambda Light Chain 0.57 with a Kappa/Lambda Ratio of 17.12. The patient spent more than 2 weeks in inpatient rehabilitation at [facility].  He tolerated his first 2 weeks of Velcade and dexamethasone without difficulty. He did have one episode of diarrhea but does not feel as though it is related to his chemotherapy. He continues to feel off balance since his surgery. He is currently undergoing home physical therapy and occupational therapy. He reports that he is not taking any pain medication in the past week. He is wearing support socks and finds this helpful. He is  feeling fine today was no specific complaints except for the weakness in the lower extremity especially the left foot. He denied having any significant fever or chills, no nausea or vomiting. He denied having any significant chest pain, shortness breath, cough or hemoptysis.  MEDICAL HISTORY: Past Medical History  Diagnosis Date  . Compression fracture   . Melanoma   . Bone cancer     t_spine T-5  . Prostate cancer 2002  . Skin cancer 1994    melanoma  right neck   . Multiple myeloma     ALLERGIES:  is allergic to iodine.  MEDICATIONS:  Current Outpatient Prescriptions  Medication Sig Dispense Refill  . acyclovir (ZOVIRAX) 400 MG tablet Take 1 tablet (400 mg total) by mouth 2 (two) times daily. 60 tablet 2  . cholecalciferol (VITAMIN D) 1000 UNITS tablet Take 1,000 Units by mouth at bedtime.     Marland Kitchen dexamethasone (DECADRON) 4 MG tablet 10 tab every week, start with chemo 40 tablet 4  . HYDROCORTISONE, TOPICAL, 2.5 % SOLN Apply 1 application topically daily as needed.     Marland Kitchen levothyroxine (SYNTHROID, LEVOTHROID) 25 MCG tablet Take 1 tablet (25 mcg total) by mouth daily. 30 tablet 4  . metroNIDAZOLE (METROCREAM) 0.75 % cream Apply 1 application topically See admin instructions. Every time pt washes face, he put a small amount on his nose.    . Multiple Vitamins-Minerals (CENTRUM SILVER PO) Take 1 tablet by mouth daily.    . Polyvinyl Alcohol-Povidone (REFRESH OP) Apply 1 drop to eye daily as needed (dry/irritated eyes).    . prochlorperazine (COMPAZINE) 10 MG tablet Take 1 tablet (10 mg total) by mouth every 6 (six) hours as needed for nausea or vomiting.  30 tablet 0  . tamsulosin (FLOMAX) 0.4 MG CAPS capsule Take 1 capsule (0.4 mg total) by mouth at bedtime. 30 capsule 3  . celecoxib (CELEBREX) 200 MG capsule Take 200 mg by mouth every other day.     . Glucosamine-Chondroit-Vit C-Mn (GLUCOSAMINE 1500 COMPLEX PO) Take 2 tablets by mouth daily after breakfast.     .  HYDROcodone-acetaminophen (NORCO/VICODIN) 5-325 MG per tablet Take 1-2 tablets by mouth every 4 (four) hours as needed for moderate pain. (Patient not taking: Reported on 08/02/2014) 90 tablet 0  . methocarbamol (ROBAXIN) 500 MG tablet Take 1 tablet (500 mg total) by mouth every 6 (six) hours as needed for muscle spasms. (Patient not taking: Reported on 08/02/2014) 60 tablet 0  . omeprazole (PRILOSEC) 40 MG capsule Take 40 mg by mouth daily. In the afternoon  11  . ranitidine (ZANTAC) 150 MG tablet Take 150 mg by mouth at bedtime.   11   No current facility-administered medications for this visit.    SURGICAL HISTORY:  Past Surgical History  Procedure Laterality Date  . Hernia repair    . Posterior cervical fusion/foraminotomy N/A 06/17/2014    Procedure: Thoracic five laminectomy for epidural tumor resection Thoracic 4-7 posterior lateral arthrodesis, segmental pedicle screw fixation.;  Surgeon: Kyle Cabbell, MD;  Location: [facility];  Service: Neurosurgery;  Laterality: N/A;    REVIEW OF SYSTEMS:  Constitutional: positive for fatigue Eyes: negative Ears, nose, mouth, throat, and face: negative Respiratory: negative Cardiovascular: negative Gastrointestinal: negative Genitourinary:negative Integument/breast: negative Hematologic/lymphatic: negative Musculoskeletal:positive for muscle weakness Neurological: positive for weakness Behavioral/Psych: negative Endocrine: negative Allergic/Immunologic: negative   PHYSICAL EXAMINATION: General appearance: alert, cooperative, fatigued and no distress Head: Normocephalic, without obvious abnormality, atraumatic Neck: no adenopathy, no JVD, supple, symmetrical, trachea midline and thyroid not enlarged, symmetric, no tenderness/mass/nodules Lymph nodes: Cervical, supraclavicular, and axillary nodes normal. Resp: clear to auscultation bilaterally Back: symmetric, no curvature. ROM normal. No CVA tenderness. Cardio: regular rate and  rhythm, S1, S2 normal, no murmur, click, rub or gallop GI: soft, non-tender; bowel sounds normal; no masses,  no organomegaly Extremities: extremities normal, atraumatic, no cyanosis or edema Neurologic: Motor: Weakness in the left foot  ECOG PERFORMANCE STATUS: 1 - Symptomatic but completely ambulatory  Blood pressure 111/54, pulse 79, temperature 98.2 F (36.8 C), temperature source Oral, resp. rate 18, height 5' 9" (1.753 m), weight 160 lb 12.8 oz (72.938 kg), SpO2 100 %.  LABORATORY DATA: Lab Results  Component Value Date   WBC 5.4 08/02/2014   HGB 12.3* 08/02/2014   HCT 37.3* 08/02/2014   MCV 99.8* 08/02/2014   PLT 172 08/02/2014      Chemistry      Component Value Date/Time   NA 135* 08/02/2014 0954   NA 133* 07/04/2014 0430   K 3.9 08/02/2014 0954   K 4.1 07/04/2014 0430   CL 97 07/04/2014 0430   CO2 29 08/02/2014 0954   CO2 27 07/04/2014 0430   BUN 17.6 08/02/2014 0954   BUN 18 07/04/2014 0430   CREATININE 1.0 08/02/2014 0954   CREATININE 0.90 07/04/2014 0430      Component Value Date/Time   CALCIUM 9.1 08/02/2014 0954   CALCIUM 8.9 07/04/2014 0430   ALKPHOS 67 08/02/2014 0954   ALKPHOS 47 06/21/2014 0406   AST 19 08/02/2014 0954   AST 31 06/21/2014 0406   ALT 17 08/02/2014 0954   ALT 29 06/21/2014 0406   BILITOT 0.65 08/02/2014 0954   BILITOT <0.2* 06/21/2014 0406         RADIOGRAPHIC STUDIES: No results found.  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 He is currently being treated with subcutaneous Velcade given weekly with oral dexamethasone 40 mg by mouth on weekly basis. He is status post 2 weeks of therapy. Overall is tolerating this treatment relatively well. Patient was discussed with and also seen by Dr. Julien Nordmann. He'll continue on his current course of treatment and follow-up in  2 weeks for another symptom management visit. He is to continue with his home physical therapy and occupational therapy as ordered. He was advised to call immediately if he has any concerning symptoms in the interval. The patient voices understanding of current disease status and treatment options and is in agreement with the current care plan.  All questions were answered. The patient knows to call the clinic with any problems, questions or concerns. We can certainly see the patient much sooner if necessary.  Sampson SiADDENDUM: Hematology/Oncology Attending: I had a face to face encounter with the patient. I recommended his care plan. This is a very pleasant 80 years old white male recently diagnosed with multiple myeloma currently undergoing treatment with subcutaneous weekly Velcade in addition to weekly dexamethasone 40 mg by mouth. He is status post 2 weekly doses of subcutaneous Velcade and tolerating his treatment fairly well with no significant adverse effects. The patient continues to have weakness in the lower extremities. He is expected to see radiation oncology for evaluation and consideration of palliative radiotherapy to the metastatic lesion at the T5 vertebra. I recommended for the patient to continue his current treatment with subcutaneous Velcade and Decadron as scheduled. He would come back for follow-up visit in 2 weeks for reevaluation and management of any adverse effect of his treatment. He was advised to call immediately if he has any concerning symptoms in the interval.  Disclaimer: This note was dictated with voice recognition software. Similar sounding words can inadvertently be transcribed and may not be corrected upon review. Eilleen Kempf., MD 08/05/2014

## 2014-08-02 NOTE — Patient Instructions (Signed)
[facility] Discharge Instructions for Patients Receiving Chemotherapy  Today you received the following chemotherapy agents Velcade.  To help prevent nausea and vomiting after your treatment, we encourage you to take your nausea medication as directed.    If you develop nausea and vomiting that is not controlled by your nausea medication, call the clinic.   BELOW ARE SYMPTOMS THAT SHOULD BE REPORTED IMMEDIATELY:  *FEVER GREATER THAN 100.5 F  *CHILLS WITH OR WITHOUT FEVER  NAUSEA AND VOMITING THAT IS NOT CONTROLLED WITH YOUR NAUSEA MEDICATION  *UNUSUAL SHORTNESS OF BREATH  *UNUSUAL BRUISING OR BLEEDING  TENDERNESS IN MOUTH AND THROAT WITH OR WITHOUT PRESENCE OF ULCERS  *URINARY PROBLEMS  *BOWEL PROBLEMS  UNUSUAL RASH Items with * indicate a potential emergency and should be followed up as soon as possible.  Feel free to call the clinic you have any questions or concerns. The clinic phone number is [phone].    

## 2014-08-03 DIAGNOSIS — C412 Malignant neoplasm of vertebral column: Secondary | ICD-10-CM | POA: Diagnosis not present

## 2014-08-03 DIAGNOSIS — M4854XS Collapsed vertebra, not elsewhere classified, thoracic region, sequela of fracture: Secondary | ICD-10-CM | POA: Diagnosis not present

## 2014-08-03 DIAGNOSIS — G839 Paralytic syndrome, unspecified: Secondary | ICD-10-CM | POA: Diagnosis not present

## 2014-08-03 DIAGNOSIS — N400 Enlarged prostate without lower urinary tract symptoms: Secondary | ICD-10-CM | POA: Diagnosis not present

## 2014-08-03 DIAGNOSIS — C9000 Multiple myeloma not having achieved remission: Secondary | ICD-10-CM | POA: Diagnosis not present

## 2014-08-03 DIAGNOSIS — Z483 Aftercare following surgery for neoplasm: Secondary | ICD-10-CM | POA: Diagnosis not present

## 2014-08-03 NOTE — Patient Instructions (Signed)
Continue labs and chemotherapy as scheduled Followup in 2 weeks for another symptom management visit 

## 2014-08-04 DIAGNOSIS — C9000 Multiple myeloma not having achieved remission: Secondary | ICD-10-CM | POA: Diagnosis not present

## 2014-08-04 DIAGNOSIS — M4854XS Collapsed vertebra, not elsewhere classified, thoracic region, sequela of fracture: Secondary | ICD-10-CM | POA: Diagnosis not present

## 2014-08-04 DIAGNOSIS — Z483 Aftercare following surgery for neoplasm: Secondary | ICD-10-CM | POA: Diagnosis not present

## 2014-08-04 DIAGNOSIS — C412 Malignant neoplasm of vertebral column: Secondary | ICD-10-CM | POA: Diagnosis not present

## 2014-08-04 DIAGNOSIS — G839 Paralytic syndrome, unspecified: Secondary | ICD-10-CM | POA: Diagnosis not present

## 2014-08-04 DIAGNOSIS — N400 Enlarged prostate without lower urinary tract symptoms: Secondary | ICD-10-CM | POA: Diagnosis not present

## 2014-08-05 DIAGNOSIS — C412 Malignant neoplasm of vertebral column: Secondary | ICD-10-CM | POA: Diagnosis not present

## 2014-08-05 DIAGNOSIS — Z483 Aftercare following surgery for neoplasm: Secondary | ICD-10-CM | POA: Diagnosis not present

## 2014-08-05 DIAGNOSIS — M4854XS Collapsed vertebra, not elsewhere classified, thoracic region, sequela of fracture: Secondary | ICD-10-CM | POA: Diagnosis not present

## 2014-08-05 DIAGNOSIS — G839 Paralytic syndrome, unspecified: Secondary | ICD-10-CM | POA: Diagnosis not present

## 2014-08-05 DIAGNOSIS — C9000 Multiple myeloma not having achieved remission: Secondary | ICD-10-CM | POA: Diagnosis not present

## 2014-08-05 DIAGNOSIS — N400 Enlarged prostate without lower urinary tract symptoms: Secondary | ICD-10-CM | POA: Diagnosis not present

## 2014-08-07 ENCOUNTER — Ambulatory Visit
Admission: RE | Admit: 2014-08-07 | Discharge: 2014-08-07 | Disposition: A | Discharge status: Home / Self Care | Payer: Medicare Other | Source: Ambulatory Visit | Attending: Radiation Oncology | Admitting: Radiation Oncology

## 2014-08-07 ENCOUNTER — Encounter: Payer: Self-pay | Admitting: Radiation Oncology

## 2014-08-07 VITALS — BP 128/63 | HR 79 | Temp 98.1°F | Resp 20 | Ht 69.0 in | Wt 163.5 lb

## 2014-08-07 DIAGNOSIS — C7951 Secondary malignant neoplasm of bone: Secondary | ICD-10-CM | POA: Diagnosis not present

## 2014-08-07 DIAGNOSIS — Z9889 Other specified postprocedural states: Secondary | ICD-10-CM | POA: Insufficient documentation

## 2014-08-07 DIAGNOSIS — C9000 Multiple myeloma not having achieved remission: Secondary | ICD-10-CM | POA: Insufficient documentation

## 2014-08-07 DIAGNOSIS — C419 Malignant neoplasm of bone and articular cartilage, unspecified: Secondary | ICD-10-CM

## 2014-08-07 HISTORY — DX: Disorder of thyroid, unspecified: E07.9

## 2014-08-07 HISTORY — DX: Unspecified malignant neoplasm of skin, unspecified: C44.90

## 2014-08-07 HISTORY — DX: Multiple myeloma not having achieved remission: C90.00

## 2014-08-07 HISTORY — DX: Anxiety disorder, unspecified: F41.9

## 2014-08-07 HISTORY — DX: Malignant neoplasm of bone and articular cartilage, unspecified: C41.9

## 2014-08-07 HISTORY — DX: Allergy, unspecified, initial encounter: T78.40XA

## 2014-08-07 NOTE — Progress Notes (Signed)
Radiation Oncology         [phone] ________________________________  Name: Roberts Bon MRN: [id number]  Date: 08/07/2014  DOB: 1930-07-09  Follow-Up Visit Note  CC: Wenda Low, MD  Rulon Eisenmenger, MD  Diagnosis:   Multiple myeloma  Narrative:  The patient returns today for post surgery follow-up. The patient was originally seen in the emergency department for spinal cord compression. Surgery was felt to be the best option for him and he proceeded with this on 06/18/2014. Tumor was removed in the T5 vertebral body region and a complete resection of the mass was not possible. The patient has recovered well overall and has begun systemic treatment with Dr. Earlie Server in medical oncology. The patient's bone survey did not reveal any clear lytic lesions. Overall he has been recovering well from his surgery and he presents today for discussion of possible consolidative palliative radiation treatment to the thoracic spine.                          ALLERGIES:  is allergic to iodine.  Meds: Current Outpatient Prescriptions  Medication Sig Dispense Refill  . acyclovir (ZOVIRAX) 400 MG tablet Take 1 tablet (400 mg total) by mouth 2 (two) times daily. 60 tablet 2  . cholecalciferol (VITAMIN D) 1000 UNITS tablet Take 1,000 Units by mouth at bedtime.     Marland Kitchen dexamethasone (DECADRON) 4 MG tablet 10 tab every week, start with chemo 40 tablet 4  . HYDROCORTISONE, TOPICAL, 2.5 % SOLN Apply 1 application topically daily as needed.     Marland Kitchen levothyroxine (SYNTHROID, LEVOTHROID) 25 MCG tablet Take 1 tablet (25 mcg total) by mouth daily. 30 tablet 4  . methocarbamol (ROBAXIN) 500 MG tablet Take 1 tablet (500 mg total) by mouth every 6 (six) hours as needed for muscle spasms. 60 tablet 0  . metroNIDAZOLE (METROCREAM) 0.75 % cream Apply 1 application topically See admin instructions. Every time pt washes face, he put a small amount on his nose.    . Multiple Vitamins-Minerals (CENTRUM SILVER PO) Take 1 tablet  by mouth daily.    . tamsulosin (FLOMAX) 0.4 MG CAPS capsule Take 1 capsule (0.4 mg total) by mouth at bedtime. 30 capsule 3  . celecoxib (CELEBREX) 200 MG capsule Take 200 mg by mouth every other day.     . Glucosamine-Chondroit-Vit C-Mn (GLUCOSAMINE 1500 COMPLEX PO) Take 2 tablets by mouth daily after breakfast.     . HYDROcodone-acetaminophen (NORCO/VICODIN) 5-325 MG per tablet Take 1-2 tablets by mouth every 4 (four) hours as needed for moderate pain. (Patient not taking: Reported on 08/02/2014) 90 tablet 0  . omeprazole (PRILOSEC) 40 MG capsule Take 40 mg by mouth daily. In the afternoon  11  . Polyvinyl Alcohol-Povidone (REFRESH OP) Apply 1 drop to eye daily as needed (dry/irritated eyes).    . prochlorperazine (COMPAZINE) 10 MG tablet Take 1 tablet (10 mg total) by mouth every 6 (six) hours as needed for nausea or vomiting. (Patient not taking: Reported on 08/07/2014) 30 tablet 0  . ranitidine (ZANTAC) 150 MG tablet Take 150 mg by mouth at bedtime.   11   No current facility-administered medications for this encounter.    Physical Findings: The patient is in no acute distress. Patient is alert and oriented.  height is _0  (1.753 m) and weight is 163 lb 8 oz (74.163 kg). His oral temperature is 98.1 F (36.7 C). His blood pressure is 128/63 and his pulse is 79.  His respiration is 20 and oxygen saturation is 99%. .     Lab Findings: Lab Results  Component Value Date   WBC 5.4 08/02/2014   HGB 12.3* 08/02/2014   HCT 37.3* 08/02/2014   MCV 99.8* 08/02/2014   PLT 172 08/02/2014     Radiographic Findings: No results found.  Impression:    The patient has been recovering from his surgery although he still does have some weakness. The patient in my opinion is a appropriate candidate for consolidative radiation treatment to the T5 region of the thoracic spine. The patient had a significant cord compression at presentation, residual tumor was felt to be present after resection, and  myeloma is also extremely sensitive to radiation which allows good results with decreased radiation dose. I discussed all of this with the patient with the rationale being prevention of re-growth/cord compression in the future at this location. We also discussed the possible side effects and risks of treatment. All of his questions were answered. The patient does wish to proceed with this treatment given our discussion today.  Plan:  The patient will be scheduled for a simulation in the near future such that we can proceed with treatment planning. I anticipate treating the patient to a dose of 25 gray in 10 fractions. He will continue systemic treatment through Dr. Julien Nordmann in medical oncology.  I spent 30 minutes with the patient today, the majority of which was spent counseling him on his diagnosis of myeloma and coordinating his care.   Jodelle Gross, M.D., Ph.D.

## 2014-08-07 NOTE — Progress Notes (Signed)
Please see the Nurse Progress Note in the MD Initial Consult Encounter for this patient. 

## 2014-08-08 DIAGNOSIS — M4854XS Collapsed vertebra, not elsewhere classified, thoracic region, sequela of fracture: Secondary | ICD-10-CM | POA: Diagnosis not present

## 2014-08-08 DIAGNOSIS — C412 Malignant neoplasm of vertebral column: Secondary | ICD-10-CM | POA: Diagnosis not present

## 2014-08-08 DIAGNOSIS — G839 Paralytic syndrome, unspecified: Secondary | ICD-10-CM | POA: Diagnosis not present

## 2014-08-08 DIAGNOSIS — Z483 Aftercare following surgery for neoplasm: Secondary | ICD-10-CM | POA: Diagnosis not present

## 2014-08-08 DIAGNOSIS — N400 Enlarged prostate without lower urinary tract symptoms: Secondary | ICD-10-CM | POA: Diagnosis not present

## 2014-08-08 DIAGNOSIS — C9000 Multiple myeloma not having achieved remission: Secondary | ICD-10-CM | POA: Diagnosis not present

## 2014-08-09 ENCOUNTER — Telehealth: Payer: Self-pay | Admitting: *Deleted

## 2014-08-09 ENCOUNTER — Ambulatory Visit (HOSPITAL_BASED_OUTPATIENT_CLINIC_OR_DEPARTMENT_OTHER): Payer: Medicare Other

## 2014-08-09 ENCOUNTER — Other Ambulatory Visit (HOSPITAL_BASED_OUTPATIENT_CLINIC_OR_DEPARTMENT_OTHER): Payer: Medicare Other

## 2014-08-09 DIAGNOSIS — C9000 Multiple myeloma not having achieved remission: Secondary | ICD-10-CM

## 2014-08-09 DIAGNOSIS — G839 Paralytic syndrome, unspecified: Secondary | ICD-10-CM | POA: Diagnosis not present

## 2014-08-09 DIAGNOSIS — D492 Neoplasm of unspecified behavior of bone, soft tissue, and skin: Secondary | ICD-10-CM

## 2014-08-09 DIAGNOSIS — Z483 Aftercare following surgery for neoplasm: Secondary | ICD-10-CM | POA: Diagnosis not present

## 2014-08-09 DIAGNOSIS — N400 Enlarged prostate without lower urinary tract symptoms: Secondary | ICD-10-CM | POA: Diagnosis not present

## 2014-08-09 DIAGNOSIS — Z5112 Encounter for antineoplastic immunotherapy: Secondary | ICD-10-CM

## 2014-08-09 DIAGNOSIS — M4854XS Collapsed vertebra, not elsewhere classified, thoracic region, sequela of fracture: Secondary | ICD-10-CM | POA: Diagnosis not present

## 2014-08-09 DIAGNOSIS — C412 Malignant neoplasm of vertebral column: Secondary | ICD-10-CM | POA: Diagnosis not present

## 2014-08-09 DIAGNOSIS — C9002 Multiple myeloma in relapse: Secondary | ICD-10-CM

## 2014-08-09 LAB — COMPREHENSIVE METABOLIC PANEL (CC13)
ALT: 19 U/L (ref 0–55)
AST: 18 U/L (ref 5–34)
Albumin: 3.4 g/dL — ABNORMAL LOW (ref 3.5–5.0)
Alkaline Phosphatase: 70 U/L (ref 40–150)
Anion Gap: 5 mEq/L (ref 3–11)
BILIRUBIN TOTAL: 0.46 mg/dL (ref 0.20–1.20)
BUN: 18.2 mg/dL (ref 7.0–26.0)
CALCIUM: 9.2 mg/dL (ref 8.4–10.4)
CHLORIDE: 101 meq/L (ref 98–109)
CO2: 29 meq/L (ref 22–29)
CREATININE: 1 mg/dL (ref 0.7–1.3)
EGFR: 70 mL/min/{1.73_m2} — ABNORMAL LOW (ref >90)
GLUCOSE: 118 mg/dL (ref 70–140)
POTASSIUM: 4.2 meq/L (ref 3.5–5.1)
Sodium: 136 mEq/L (ref 136–145)
Total Protein: 7.8 g/dL (ref 6.4–8.3)

## 2014-08-09 LAB — CBC WITH DIFFERENTIAL/PLATELET
BASO%: 0.2 % (ref 0.0–2.0)
Basophils Absolute: 0 10*3/uL (ref 0.0–0.1)
EOS ABS: 0.1 10*3/uL (ref 0.0–0.5)
EOS%: 1.1 % (ref 0.0–7.0)
HCT: 37 % — ABNORMAL LOW (ref 38.4–49.9)
HGB: 12 g/dL — ABNORMAL LOW (ref 13.0–17.1)
LYMPH#: 0.7 10*3/uL — AB (ref 0.9–3.3)
LYMPH%: 12.4 % — AB (ref 14.0–49.0)
MCH: 32.2 pg (ref 27.2–33.4)
MCHC: 32.4 g/dL (ref 32.0–36.0)
MCV: 99.4 fL — ABNORMAL HIGH (ref 79.3–98.0)
MONO#: 0.4 10*3/uL (ref 0.1–0.9)
MONO%: 6.8 % (ref 0.0–14.0)
NEUT#: 4.3 10*3/uL (ref 1.5–6.5)
NEUT%: 79.5 % — AB (ref 39.0–75.0)
Platelets: 154 10*3/uL (ref 140–400)
RBC: 3.73 10*6/uL — AB (ref 4.20–5.82)
RDW: 13.8 % (ref 11.0–14.6)
WBC: 5.5 10*3/uL (ref 4.0–10.3)

## 2014-08-09 MED ORDER — ONDANSETRON HCL 8 MG PO TABS
ORAL_TABLET | ORAL | Status: AC
  Filled 2014-08-09: qty 1

## 2014-08-09 MED ORDER — BORTEZOMIB CHEMO SQ INJECTION 3.5 MG (2.5MG/ML)
1.3000 mg/m2 | Freq: Once | INTRAMUSCULAR | Status: AC
  Administered 2014-08-09: 2.5 mg via SUBCUTANEOUS
  Filled 2014-08-09: qty 2.5

## 2014-08-09 MED ORDER — ONDANSETRON HCL 8 MG PO TABS
8.0000 mg | ORAL_TABLET | Freq: Once | ORAL | Status: AC
  Administered 2014-08-09: 8 mg via ORAL

## 2014-08-09 NOTE — Telephone Encounter (Signed)
i'm happy to make a referral to outpt OT.  (just OT not PT?)---neuro rehab on [address]?

## 2014-08-09 NOTE — Telephone Encounter (Signed)
Asking for a 2 week extension on Home Health OT, and states patient will be transitioning to outpatient OT and needs a script/referral for that. Would like to start that process now. I gave the verbal order for the extension, just need referral for outpatient therapy

## 2014-08-09 NOTE — Patient Instructions (Signed)
[facility] Discharge Instructions for Patients Receiving Chemotherapy  Today you received the following chemotherapy agents: Velcade.  To help prevent nausea and vomiting after your treatment, we encourage you to take your nausea medication as prescribed.   If you develop nausea and vomiting that is not controlled by your nausea medication, call the clinic.   BELOW ARE SYMPTOMS THAT SHOULD BE REPORTED IMMEDIATELY:  *FEVER GREATER THAN 100.5 F  *CHILLS WITH OR WITHOUT FEVER  NAUSEA AND VOMITING THAT IS NOT CONTROLLED WITH YOUR NAUSEA MEDICATION  *UNUSUAL SHORTNESS OF BREATH  *UNUSUAL BRUISING OR BLEEDING  TENDERNESS IN MOUTH AND THROAT WITH OR WITHOUT PRESENCE OF ULCERS  *URINARY PROBLEMS  *BOWEL PROBLEMS  UNUSUAL RASH Items with * indicate a potential emergency and should be followed up as soon as possible.  Feel free to call the clinic you have any questions or concerns. The clinic phone number is [phone].    

## 2014-08-11 ENCOUNTER — Ambulatory Visit
Admission: RE | Admit: 2014-08-11 | Discharge: 2014-08-11 | Disposition: A | Discharge status: Home / Self Care | Payer: Medicare Other | Source: Ambulatory Visit | Attending: Radiation Oncology | Admitting: Radiation Oncology

## 2014-08-11 ENCOUNTER — Encounter: Payer: Self-pay | Admitting: General Practice

## 2014-08-11 DIAGNOSIS — M4854XS Collapsed vertebra, not elsewhere classified, thoracic region, sequela of fracture: Secondary | ICD-10-CM | POA: Diagnosis not present

## 2014-08-11 DIAGNOSIS — N400 Enlarged prostate without lower urinary tract symptoms: Secondary | ICD-10-CM | POA: Diagnosis not present

## 2014-08-11 DIAGNOSIS — Z9889 Other specified postprocedural states: Secondary | ICD-10-CM | POA: Diagnosis not present

## 2014-08-11 DIAGNOSIS — C9000 Multiple myeloma not having achieved remission: Secondary | ICD-10-CM | POA: Diagnosis not present

## 2014-08-11 DIAGNOSIS — C7951 Secondary malignant neoplasm of bone: Secondary | ICD-10-CM | POA: Diagnosis not present

## 2014-08-11 DIAGNOSIS — G839 Paralytic syndrome, unspecified: Secondary | ICD-10-CM | POA: Diagnosis not present

## 2014-08-11 DIAGNOSIS — C412 Malignant neoplasm of vertebral column: Secondary | ICD-10-CM | POA: Diagnosis not present

## 2014-08-11 DIAGNOSIS — Z483 Aftercare following surgery for neoplasm: Secondary | ICD-10-CM | POA: Diagnosis not present

## 2014-08-11 DIAGNOSIS — C419 Malignant neoplasm of bone and articular cartilage, unspecified: Secondary | ICD-10-CM

## 2014-08-11 NOTE — Progress Notes (Signed)
Late entry.  Visited with Maycol and his wife Denice Paradise in lobby on 08/09/14, providing pastoral presence, reflective listening, and emotional support.  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  Denice Paradise shared discreetly about her stress as a caregiver, particularly the lack of time for self-care and personal meaning-making.  She indicated appreciation of the availability of so many support programs, lamenting the challenge of actually having time and opportunity to participate.  We brainstormed about ways that she could utilize the offers she has received for support from family, friends, and church.  In particular, she valued her friend's suggesting that the friend's husband take Carlyle out to lunch so that the two ladies could have some friend time at home together; we strategized about ways that church and other supporters could use this model to support both [place].  I also encouraged Denice Paradise to use her wait time at Rasaan's appointments for a bit of respite (meeting with chaplain or counseling intern, using art table, etc).  Provided pastoral presence, reflective listening, fellowship as a Engineer, technical sales, and witness to their story and struggles.  Both verbalized gratitude.  [place], [place]

## 2014-08-12 DIAGNOSIS — C412 Malignant neoplasm of vertebral column: Secondary | ICD-10-CM | POA: Diagnosis not present

## 2014-08-12 DIAGNOSIS — M4854XS Collapsed vertebra, not elsewhere classified, thoracic region, sequela of fracture: Secondary | ICD-10-CM | POA: Diagnosis not present

## 2014-08-12 DIAGNOSIS — G839 Paralytic syndrome, unspecified: Secondary | ICD-10-CM | POA: Diagnosis not present

## 2014-08-12 DIAGNOSIS — N400 Enlarged prostate without lower urinary tract symptoms: Secondary | ICD-10-CM | POA: Diagnosis not present

## 2014-08-12 DIAGNOSIS — Z483 Aftercare following surgery for neoplasm: Secondary | ICD-10-CM | POA: Diagnosis not present

## 2014-08-12 DIAGNOSIS — C9000 Multiple myeloma not having achieved remission: Secondary | ICD-10-CM | POA: Diagnosis not present

## 2014-08-14 NOTE — Progress Notes (Signed)
  Radiation Oncology         [phone] ________________________________  Name: Jeremiah Owens MRN: [id number]  Date: 08/11/2014  DOB: 29-Jul-1930  SIMULATION AND TREATMENT PLANNING NOTE  DIAGNOSIS:  Multiple myeloma  Site:  T4-7 spine  NARRATIVE:  The patient was brought to the [facility].  Identity was confirmed.  All relevant records and images related to the planned course of therapy were reviewed.   Written consent to proceed with treatment was confirmed which was freely given after reviewing the details related to the planned course of therapy had been reviewed with the patient.  Then, the patient was set-up in a stable reproducible  supine position for radiation therapy.  CT images were obtained.  Surface markings were placed.    Medically necessary complex treatment device(s) for immobilization:  accuform device.   The CT images were loaded into the planning software.  Then the target and avoidance structures were contoured.  Treatment planning then occurred.  The radiation prescription was entered and confirmed.  A total of 2 complex treatment devices were fabricated which relate to the designed radiation treatment fields. Each of these customized fields/ complex treatment devices will be used on a daily basis during the radiation course. I have requested : Isodose Plan.   PLAN:  The patient will receive 25 Gy in 10 fractions.  ________________________________   Jodelle Gross, MD, PhD

## 2014-08-15 DIAGNOSIS — G839 Paralytic syndrome, unspecified: Secondary | ICD-10-CM | POA: Diagnosis not present

## 2014-08-15 DIAGNOSIS — Z483 Aftercare following surgery for neoplasm: Secondary | ICD-10-CM | POA: Diagnosis not present

## 2014-08-15 DIAGNOSIS — N400 Enlarged prostate without lower urinary tract symptoms: Secondary | ICD-10-CM | POA: Diagnosis not present

## 2014-08-15 DIAGNOSIS — M4854XS Collapsed vertebra, not elsewhere classified, thoracic region, sequela of fracture: Secondary | ICD-10-CM | POA: Diagnosis not present

## 2014-08-15 DIAGNOSIS — C9000 Multiple myeloma not having achieved remission: Secondary | ICD-10-CM | POA: Diagnosis not present

## 2014-08-15 DIAGNOSIS — C412 Malignant neoplasm of vertebral column: Secondary | ICD-10-CM | POA: Diagnosis not present

## 2014-08-16 ENCOUNTER — Other Ambulatory Visit: Payer: Medicare Other

## 2014-08-16 ENCOUNTER — Ambulatory Visit (HOSPITAL_BASED_OUTPATIENT_CLINIC_OR_DEPARTMENT_OTHER): Payer: Medicare Other

## 2014-08-16 ENCOUNTER — Ambulatory Visit (HOSPITAL_BASED_OUTPATIENT_CLINIC_OR_DEPARTMENT_OTHER): Payer: Medicare Other | Admitting: Internal Medicine

## 2014-08-16 ENCOUNTER — Other Ambulatory Visit (HOSPITAL_BASED_OUTPATIENT_CLINIC_OR_DEPARTMENT_OTHER): Payer: Medicare Other

## 2014-08-16 ENCOUNTER — Telehealth: Payer: Self-pay | Admitting: Internal Medicine

## 2014-08-16 ENCOUNTER — Encounter: Payer: Self-pay | Admitting: Internal Medicine

## 2014-08-16 VITALS — BP 118/61 | HR 76 | Temp 97.8°F | Resp 19 | Ht 69.0 in | Wt 162.0 lb

## 2014-08-16 DIAGNOSIS — C9000 Multiple myeloma not having achieved remission: Secondary | ICD-10-CM

## 2014-08-16 DIAGNOSIS — R531 Weakness: Secondary | ICD-10-CM

## 2014-08-16 DIAGNOSIS — Z5112 Encounter for antineoplastic immunotherapy: Secondary | ICD-10-CM | POA: Diagnosis not present

## 2014-08-16 DIAGNOSIS — D492 Neoplasm of unspecified behavior of bone, soft tissue, and skin: Secondary | ICD-10-CM

## 2014-08-16 DIAGNOSIS — C9002 Multiple myeloma in relapse: Secondary | ICD-10-CM

## 2014-08-16 LAB — CBC WITH DIFFERENTIAL/PLATELET
BASO%: 0.3 % (ref 0.0–2.0)
BASOS ABS: 0 10*3/uL (ref 0.0–0.1)
EOS%: 1.9 % (ref 0.0–7.0)
Eosinophils Absolute: 0.1 10*3/uL (ref 0.0–0.5)
HEMATOCRIT: 35.5 % — AB (ref 38.4–49.9)
HEMOGLOBIN: 11.6 g/dL — AB (ref 13.0–17.1)
LYMPH#: 0.5 10*3/uL — AB (ref 0.9–3.3)
LYMPH%: 10.9 % — ABNORMAL LOW (ref 14.0–49.0)
MCH: 32.2 pg (ref 27.2–33.4)
MCHC: 32.6 g/dL (ref 32.0–36.0)
MCV: 99 fL — AB (ref 79.3–98.0)
MONO#: 0.2 10*3/uL (ref 0.1–0.9)
MONO%: 4.5 % (ref 0.0–14.0)
NEUT#: 3.6 10*3/uL (ref 1.5–6.5)
NEUT%: 82.4 % — ABNORMAL HIGH (ref 39.0–75.0)
Platelets: 149 10*3/uL (ref 140–400)
RBC: 3.58 10*6/uL — ABNORMAL LOW (ref 4.20–5.82)
RDW: 13.8 % (ref 11.0–14.6)
WBC: 4.4 10*3/uL (ref 4.0–10.3)

## 2014-08-16 LAB — COMPREHENSIVE METABOLIC PANEL (CC13)
ALK PHOS: 65 U/L (ref 40–150)
ALT: 13 U/L (ref 0–55)
ANION GAP: 3 meq/L (ref 3–11)
AST: 19 U/L (ref 5–34)
Albumin: 3.2 g/dL — ABNORMAL LOW (ref 3.5–5.0)
BUN: 17.5 mg/dL (ref 7.0–26.0)
CALCIUM: 8.8 mg/dL (ref 8.4–10.4)
CO2: 29 mEq/L (ref 22–29)
Chloride: 103 mEq/L (ref 98–109)
Creatinine: 0.9 mg/dL (ref 0.7–1.3)
EGFR: 77 mL/min/{1.73_m2} — ABNORMAL LOW (ref >90)
Glucose: 87 mg/dl (ref 70–140)
Potassium: 4.3 mEq/L (ref 3.5–5.1)
Sodium: 136 mEq/L (ref 136–145)
Total Bilirubin: 0.34 mg/dL (ref 0.20–1.20)
Total Protein: 7.2 g/dL (ref 6.4–8.3)

## 2014-08-16 MED ORDER — BORTEZOMIB CHEMO SQ INJECTION 3.5 MG (2.5MG/ML)
1.3000 mg/m2 | Freq: Once | INTRAMUSCULAR | Status: AC
  Administered 2014-08-16: 2.5 mg via SUBCUTANEOUS
  Filled 2014-08-16: qty 1

## 2014-08-16 MED ORDER — ONDANSETRON HCL 8 MG PO TABS
8.0000 mg | ORAL_TABLET | Freq: Once | ORAL | Status: AC
  Administered 2014-08-16: 8 mg via ORAL

## 2014-08-16 MED ORDER — ONDANSETRON HCL 8 MG PO TABS
ORAL_TABLET | ORAL | Status: AC
Start: 2014-08-16 — End: 2014-08-16
  Filled 2014-08-16: qty 1

## 2014-08-16 NOTE — Telephone Encounter (Signed)
Gave avs & cal for Jan/Feb. Sent mess to sch tx. °

## 2014-08-16 NOTE — Progress Notes (Signed)
[place] [phone]   Fax:([phone]) [place] [employer], [address] [place] [address]  DIAGNOSIS: Multiple myeloma diagnosed in November 2015  PRIOR THERAPY: Status post Thoracic five laminectomy for epidural tumor resection, Thoracic 4-7 posterior lateral arthrodesis,  segmental pedicle screw fixation T4-T7 Globus instrumentation under the care of Dr. Christella Noa on 06/18/2014.  CURRENT THERAPY: Systemic chemotherapy with Velcade 1.3 MG/M2 subcutaneously weekly in addition to Decadron 40 mg by mouth on weekly basis. Status post 4 cycles.  INTERVAL HISTORY: Jeremiah Owens 81 y.o. male returns to the clinic today for follow-up visit accompanied by his wife. The patient is currently on treatment with weekly subcutaneous Velcade and Decadron status post 4 cycles and tolerating the treatment fairly well. He denied having any significant nausea or vomiting, no fever or chills. He denied having any significant weight loss or night sweats. He was seen recently by Dr. Lisbeth Renshaw and expected to start palliative radiotherapy to the thoracic spine lesions soon. He continues to have some weakness in the left lower extremity and currently undergoing physical therapy.He denied having any significant chest pain, shortness breath, cough or hemoptysis. He is here today to start cycle #5 of his weekly systemic therapy.  MEDICAL HISTORY: Past Medical History  Diagnosis Date  . Compression fracture   . Melanoma   . Bone cancer     t_spine T-5  . Prostate cancer 2002  . Skin cancer 1994    melanoma  right neck   . Multiple myeloma   . Allergy   . Anxiety   . Thyroid disease     ALLERGIES:  is allergic to iodine.  MEDICATIONS:  Current Outpatient Prescriptions  Medication Sig Dispense Refill  . acyclovir (ZOVIRAX) 400 MG tablet Take 1 tablet (400 mg total) by mouth 2 (two) times daily. 60 tablet 2  . cholecalciferol  (VITAMIN D) 1000 UNITS tablet Take 1,000 Units by mouth at bedtime.     Marland Kitchen dexamethasone (DECADRON) 4 MG tablet 10 tab every week, start with chemo 40 tablet 4  . HYDROcodone-acetaminophen (NORCO/VICODIN) 5-325 MG per tablet Take 1-2 tablets by mouth every 4 (four) hours as needed for moderate pain. 90 tablet 0  . HYDROCORTISONE, TOPICAL, 2.5 % SOLN Apply 1 application topically daily as needed.     Marland Kitchen levothyroxine (SYNTHROID, LEVOTHROID) 25 MCG tablet Take 1 tablet (25 mcg total) by mouth daily. 30 tablet 4  . metroNIDAZOLE (METROCREAM) 0.75 % cream Apply 1 application topically See admin instructions. Every time pt washes face, he put a small amount on his nose.    . Multiple Vitamins-Minerals (CENTRUM SILVER PO) Take 1 tablet by mouth daily.    Marland Kitchen omeprazole (PRILOSEC) 40 MG capsule Take 40 mg by mouth daily. In the afternoon  11  . Polyvinyl Alcohol-Povidone (REFRESH OP) Apply 1 drop to eye daily as needed (dry/irritated eyes).    . ranitidine (ZANTAC) 150 MG tablet Take 150 mg by mouth at bedtime.   11  . tamsulosin (FLOMAX) 0.4 MG CAPS capsule Take 1 capsule (0.4 mg total) by mouth at bedtime. 30 capsule 3  . methocarbamol (ROBAXIN) 500 MG tablet Take 1 tablet (500 mg total) by mouth every 6 (six) hours as needed for muscle spasms. (Patient not taking: Reported on 08/16/2014) 60 tablet 0  . prochlorperazine (COMPAZINE) 10 MG tablet Take 1 tablet (10 mg total) by mouth every 6 (six) hours as needed for nausea or vomiting. (  Patient not taking: Reported on 08/07/2014) 30 tablet 0   No current facility-administered medications for this visit.    SURGICAL HISTORY:  Past Surgical History  Procedure Laterality Date  . Hernia repair    . Posterior cervical fusion/foraminotomy N/A 06/17/2014    Procedure: Thoracic five laminectomy for epidural tumor resection Thoracic 4-7 posterior lateral arthrodesis, segmental pedicle screw fixation.;  Surgeon: Ashok Pall, MD;  Location: [facility];  Service:  Neurosurgery;  Laterality: N/A;    REVIEW OF SYSTEMS:  Constitutional: positive for fatigue Eyes: negative Ears, nose, mouth, throat, and face: negative Respiratory: negative Cardiovascular: negative Gastrointestinal: negative Genitourinary:negative Integument/breast: negative Hematologic/lymphatic: negative Musculoskeletal:positive for muscle weakness Neurological: positive for weakness Behavioral/Psych: negative Endocrine: negative Allergic/Immunologic: negative   PHYSICAL EXAMINATION: General appearance: alert, cooperative, fatigued and no distress Head: Normocephalic, without obvious abnormality, atraumatic Neck: no adenopathy, no JVD, supple, symmetrical, trachea midline and thyroid not enlarged, symmetric, no tenderness/mass/nodules Lymph nodes: Cervical, supraclavicular, and axillary nodes normal. Resp: clear to auscultation bilaterally Back: symmetric, no curvature. ROM normal. No CVA tenderness. Cardio: regular rate and rhythm, S1, S2 normal, no murmur, click, rub or gallop GI: soft, non-tender; bowel sounds normal; no masses,  no organomegaly Extremities: extremities normal, atraumatic, no cyanosis or edema Neurologic: Motor: Weakness in the left foot  ECOG PERFORMANCE STATUS: 1 - Symptomatic but completely ambulatory  Blood pressure 118/61, pulse 76, temperature 97.8 F (36.6 C), temperature source Oral, resp. rate 19, height $RemoveBe'5\' 9"'LyLHBgRaU$  (1.753 m), weight 162 lb (73.483 kg), SpO2 100 %.  LABORATORY DATA: Lab Results  Component Value Date   WBC 4.4 08/16/2014   HGB 11.6* 08/16/2014   HCT 35.5* 08/16/2014   MCV 99.0* 08/16/2014   PLT 149 08/16/2014      Chemistry      Component Value Date/Time   NA 136 08/16/2014 1012   NA 133* 07/04/2014 0430   K 4.3 08/16/2014 1012   K 4.1 07/04/2014 0430   CL 97 07/04/2014 0430   CO2 29 08/16/2014 1012   CO2 27 07/04/2014 0430   BUN 17.5 08/16/2014 1012   BUN 18 07/04/2014 0430   CREATININE 0.9 08/16/2014 1012    CREATININE 0.90 07/04/2014 0430      Component Value Date/Time   CALCIUM 8.8 08/16/2014 1012   CALCIUM 8.9 07/04/2014 0430   ALKPHOS 65 08/16/2014 1012   ALKPHOS 47 06/21/2014 0406   AST 19 08/16/2014 1012   AST 31 06/21/2014 0406   ALT 13 08/16/2014 1012   ALT 29 06/21/2014 0406   BILITOT 0.34 08/16/2014 1012   BILITOT <0.2* 06/21/2014 0406       RADIOGRAPHIC STUDIES: No results found.  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 He is currently on treatment with subcutaneous Velcade and Decadron status post 4 weekly doses. The patient is tolerating his treatment fairly well with no significant adverse effects. I recommended for him to continue his current treatment as scheduled. He will receive cycle #5 today. The patient will come back for follow-up visit in 3 weeks for reevaluation and management of any adverse effect of his treatment. He was advised to call immediately if he has any concerning symptoms in the interval. The patient voices understanding of current disease status and treatment options and is in agreement with the current care plan.  All questions were answered. The patient  knows to call the clinic with any problems, questions or concerns. We can certainly see the patient much sooner if necessary.  Disclaimer: This note was dictated with voice recognition software. Similar sounding words can inadvertently be transcribed and may not be corrected upon review.

## 2014-08-16 NOTE — Patient Instructions (Signed)
[facility] Discharge Instructions for Patients Receiving Chemotherapy  Today you received the following chemotherapy agents: Velcade.  To help prevent nausea and vomiting after your treatment, we encourage you to take your nausea medication as prescribed.   If you develop nausea and vomiting that is not controlled by your nausea medication, call the clinic.   BELOW ARE SYMPTOMS THAT SHOULD BE REPORTED IMMEDIATELY:  *FEVER GREATER THAN 100.5 F  *CHILLS WITH OR WITHOUT FEVER  NAUSEA AND VOMITING THAT IS NOT CONTROLLED WITH YOUR NAUSEA MEDICATION  *UNUSUAL SHORTNESS OF BREATH  *UNUSUAL BRUISING OR BLEEDING  TENDERNESS IN MOUTH AND THROAT WITH OR WITHOUT PRESENCE OF ULCERS  *URINARY PROBLEMS  *BOWEL PROBLEMS  UNUSUAL RASH Items with * indicate a potential emergency and should be followed up as soon as possible.  Feel free to call the clinic you have any questions or concerns. The clinic phone number is [phone].    

## 2014-08-17 ENCOUNTER — Encounter (HOSPITAL_COMMUNITY): Payer: Self-pay | Admitting: Neurosurgery

## 2014-08-17 DIAGNOSIS — G839 Paralytic syndrome, unspecified: Secondary | ICD-10-CM | POA: Diagnosis not present

## 2014-08-17 DIAGNOSIS — C7951 Secondary malignant neoplasm of bone: Secondary | ICD-10-CM | POA: Diagnosis not present

## 2014-08-17 DIAGNOSIS — M4854XS Collapsed vertebra, not elsewhere classified, thoracic region, sequela of fracture: Secondary | ICD-10-CM | POA: Diagnosis not present

## 2014-08-17 DIAGNOSIS — C412 Malignant neoplasm of vertebral column: Secondary | ICD-10-CM | POA: Diagnosis not present

## 2014-08-17 DIAGNOSIS — N400 Enlarged prostate without lower urinary tract symptoms: Secondary | ICD-10-CM | POA: Diagnosis not present

## 2014-08-17 DIAGNOSIS — C9000 Multiple myeloma not having achieved remission: Secondary | ICD-10-CM | POA: Diagnosis not present

## 2014-08-17 DIAGNOSIS — Z483 Aftercare following surgery for neoplasm: Secondary | ICD-10-CM | POA: Diagnosis not present

## 2014-08-17 DIAGNOSIS — Z9889 Other specified postprocedural states: Secondary | ICD-10-CM | POA: Diagnosis not present

## 2014-08-17 NOTE — Telephone Encounter (Signed)
Jeremiah Owens calling again about referral. Just needs PT.  Referral placed to out pt neuro.

## 2014-08-18 ENCOUNTER — Ambulatory Visit
Admission: RE | Admit: 2014-08-18 | Discharge: 2014-08-18 | Disposition: A | Discharge status: Home / Self Care | Payer: Medicare Other | Source: Ambulatory Visit | Attending: Radiation Oncology | Admitting: Radiation Oncology

## 2014-08-18 ENCOUNTER — Telehealth: Payer: Self-pay | Admitting: *Deleted

## 2014-08-18 DIAGNOSIS — G839 Paralytic syndrome, unspecified: Secondary | ICD-10-CM | POA: Diagnosis not present

## 2014-08-18 DIAGNOSIS — Z9889 Other specified postprocedural states: Secondary | ICD-10-CM | POA: Diagnosis not present

## 2014-08-18 DIAGNOSIS — Z483 Aftercare following surgery for neoplasm: Secondary | ICD-10-CM | POA: Diagnosis not present

## 2014-08-18 DIAGNOSIS — C7951 Secondary malignant neoplasm of bone: Secondary | ICD-10-CM | POA: Diagnosis not present

## 2014-08-18 DIAGNOSIS — M4854XS Collapsed vertebra, not elsewhere classified, thoracic region, sequela of fracture: Secondary | ICD-10-CM | POA: Diagnosis not present

## 2014-08-18 DIAGNOSIS — C412 Malignant neoplasm of vertebral column: Secondary | ICD-10-CM | POA: Diagnosis not present

## 2014-08-18 DIAGNOSIS — N400 Enlarged prostate without lower urinary tract symptoms: Secondary | ICD-10-CM | POA: Diagnosis not present

## 2014-08-18 DIAGNOSIS — C9000 Multiple myeloma not having achieved remission: Secondary | ICD-10-CM | POA: Diagnosis not present

## 2014-08-18 NOTE — Telephone Encounter (Signed)
Per staff message and POF I have scheduled appts. Advised scheduler of appts. JMW  

## 2014-08-21 ENCOUNTER — Other Ambulatory Visit: Payer: Self-pay | Admitting: Internal Medicine

## 2014-08-21 ENCOUNTER — Ambulatory Visit
Admission: RE | Admit: 2014-08-21 | Discharge: 2014-08-21 | Disposition: A | Discharge status: Home / Self Care | Payer: Medicare Other | Source: Ambulatory Visit | Attending: Radiation Oncology | Admitting: Radiation Oncology

## 2014-08-21 DIAGNOSIS — D492 Neoplasm of unspecified behavior of bone, soft tissue, and skin: Secondary | ICD-10-CM

## 2014-08-21 DIAGNOSIS — C9000 Multiple myeloma not having achieved remission: Secondary | ICD-10-CM | POA: Diagnosis not present

## 2014-08-21 DIAGNOSIS — C7951 Secondary malignant neoplasm of bone: Secondary | ICD-10-CM | POA: Diagnosis not present

## 2014-08-21 DIAGNOSIS — Z9889 Other specified postprocedural states: Secondary | ICD-10-CM | POA: Diagnosis not present

## 2014-08-21 MED ORDER — RADIAPLEXRX EX GEL
Freq: Once | CUTANEOUS | Status: AC
  Administered 2014-08-21: 12:00:00 via TOPICAL

## 2014-08-21 NOTE — Progress Notes (Signed)
Routine of clinic reviewed with patient and spouse.Patient will have 10 radiation treatments to the t-spine.Informed of possible skin discoloration and rash anterior/posterior chest and to apply radiaplex twice daily after treatment and at bedtime.Use mild soap like unscented dove or ivory.Also sore throat and esophagitis is possible to chew food well and drink fluid to help to go down.Patient also has chemotherapy every Wednesday so we reviewed when to take anti-emetic.Given Radiation Therapy and You Booklet.Informed he will see Dr.Moody on Friday after treatment for weekly assessment.

## 2014-08-22 ENCOUNTER — Ambulatory Visit
Admission: RE | Admit: 2014-08-22 | Discharge: 2014-08-22 | Disposition: A | Discharge status: Home / Self Care | Payer: Medicare Other | Source: Ambulatory Visit | Attending: Radiation Oncology | Admitting: Radiation Oncology

## 2014-08-22 DIAGNOSIS — C9000 Multiple myeloma not having achieved remission: Secondary | ICD-10-CM | POA: Diagnosis not present

## 2014-08-22 DIAGNOSIS — Z9889 Other specified postprocedural states: Secondary | ICD-10-CM | POA: Diagnosis not present

## 2014-08-23 ENCOUNTER — Ambulatory Visit
Admission: RE | Admit: 2014-08-23 | Discharge: 2014-08-23 | Disposition: A | Discharge status: Home / Self Care | Payer: Medicare Other | Source: Ambulatory Visit | Attending: Radiation Oncology | Admitting: Radiation Oncology

## 2014-08-23 ENCOUNTER — Other Ambulatory Visit (HOSPITAL_BASED_OUTPATIENT_CLINIC_OR_DEPARTMENT_OTHER): Payer: Medicare Other

## 2014-08-23 ENCOUNTER — Ambulatory Visit (HOSPITAL_BASED_OUTPATIENT_CLINIC_OR_DEPARTMENT_OTHER): Payer: Medicare Other

## 2014-08-23 DIAGNOSIS — Z5112 Encounter for antineoplastic immunotherapy: Secondary | ICD-10-CM

## 2014-08-23 DIAGNOSIS — C9002 Multiple myeloma in relapse: Secondary | ICD-10-CM

## 2014-08-23 DIAGNOSIS — C9000 Multiple myeloma not having achieved remission: Secondary | ICD-10-CM | POA: Diagnosis not present

## 2014-08-23 DIAGNOSIS — Z9889 Other specified postprocedural states: Secondary | ICD-10-CM | POA: Diagnosis not present

## 2014-08-23 LAB — COMPREHENSIVE METABOLIC PANEL (CC13)
ALBUMIN: 3.2 g/dL — AB (ref 3.5–5.0)
ALK PHOS: 70 U/L (ref 40–150)
ALT: 20 U/L (ref 0–55)
AST: 20 U/L (ref 5–34)
Anion Gap: 5 mEq/L (ref 3–11)
BUN: 16.8 mg/dL (ref 7.0–26.0)
CO2: 28 mEq/L (ref 22–29)
Calcium: 8.5 mg/dL (ref 8.4–10.4)
Chloride: 102 mEq/L (ref 98–109)
Creatinine: 0.9 mg/dL (ref 0.7–1.3)
EGFR: 76 mL/min/{1.73_m2} — ABNORMAL LOW (ref >90)
GLUCOSE: 83 mg/dL (ref 70–140)
POTASSIUM: 4.2 meq/L (ref 3.5–5.1)
Sodium: 136 mEq/L (ref 136–145)
TOTAL PROTEIN: 7.4 g/dL (ref 6.4–8.3)
Total Bilirubin: 0.35 mg/dL (ref 0.20–1.20)

## 2014-08-23 LAB — CBC WITH DIFFERENTIAL/PLATELET
BASO%: 0 % (ref 0.0–2.0)
Basophils Absolute: 0 10*3/uL (ref 0.0–0.1)
EOS%: 1.9 % (ref 0.0–7.0)
Eosinophils Absolute: 0.1 10*3/uL (ref 0.0–0.5)
HEMATOCRIT: 35.3 % — AB (ref 38.4–49.9)
HGB: 11.8 g/dL — ABNORMAL LOW (ref 13.0–17.1)
LYMPH%: 20.6 % (ref 14.0–49.0)
MCH: 33 pg (ref 27.2–33.4)
MCHC: 33.4 g/dL (ref 32.0–36.0)
MCV: 98.6 fL — ABNORMAL HIGH (ref 79.3–98.0)
MONO#: 0.3 10*3/uL (ref 0.1–0.9)
MONO%: 8.2 % (ref 0.0–14.0)
NEUT#: 2.9 10*3/uL (ref 1.5–6.5)
NEUT%: 69.3 % (ref 39.0–75.0)
Platelets: 155 10*3/uL (ref 140–400)
RBC: 3.58 10*6/uL — AB (ref 4.20–5.82)
RDW: 13.9 % (ref 11.0–14.6)
WBC: 4.1 10*3/uL (ref 4.0–10.3)
lymph#: 0.9 10*3/uL (ref 0.9–3.3)

## 2014-08-23 MED ORDER — ONDANSETRON HCL 8 MG PO TABS
8.0000 mg | ORAL_TABLET | Freq: Once | ORAL | Status: AC
  Administered 2014-08-23: 8 mg via ORAL

## 2014-08-23 MED ORDER — ONDANSETRON HCL 8 MG PO TABS
ORAL_TABLET | ORAL | Status: AC
  Filled 2014-08-23: qty 1

## 2014-08-23 MED ORDER — BORTEZOMIB CHEMO SQ INJECTION 3.5 MG (2.5MG/ML)
1.3000 mg/m2 | Freq: Once | INTRAMUSCULAR | Status: AC
  Administered 2014-08-23: 2.5 mg via SUBCUTANEOUS
  Filled 2014-08-23: qty 2.5

## 2014-08-23 NOTE — Patient Instructions (Signed)
[facility] Discharge Instructions for Patients Receiving Chemotherapy  Today you received the following chemotherapy agents: Velcade.  To help prevent nausea and vomiting after your treatment, we encourage you to take your nausea medication as prescribed.   If you develop nausea and vomiting that is not controlled by your nausea medication, call the clinic.   BELOW ARE SYMPTOMS THAT SHOULD BE REPORTED IMMEDIATELY:  *FEVER GREATER THAN 100.5 F  *CHILLS WITH OR WITHOUT FEVER  NAUSEA AND VOMITING THAT IS NOT CONTROLLED WITH YOUR NAUSEA MEDICATION  *UNUSUAL SHORTNESS OF BREATH  *UNUSUAL BRUISING OR BLEEDING  TENDERNESS IN MOUTH AND THROAT WITH OR WITHOUT PRESENCE OF ULCERS  *URINARY PROBLEMS  *BOWEL PROBLEMS  UNUSUAL RASH Items with * indicate a potential emergency and should be followed up as soon as possible.  Feel free to call the clinic you have any questions or concerns. The clinic phone number is [phone].    

## 2014-08-24 ENCOUNTER — Ambulatory Visit
Admission: RE | Admit: 2014-08-24 | Discharge: 2014-08-24 | Disposition: A | Discharge status: Home / Self Care | Payer: Medicare Other | Source: Ambulatory Visit | Attending: Radiation Oncology | Admitting: Radiation Oncology

## 2014-08-24 DIAGNOSIS — Z9889 Other specified postprocedural states: Secondary | ICD-10-CM | POA: Diagnosis not present

## 2014-08-24 DIAGNOSIS — C9000 Multiple myeloma not having achieved remission: Secondary | ICD-10-CM | POA: Diagnosis not present

## 2014-08-25 ENCOUNTER — Ambulatory Visit: Payer: Medicare Other | Admitting: Radiation Oncology

## 2014-08-25 ENCOUNTER — Ambulatory Visit: Payer: Medicare Other

## 2014-08-28 ENCOUNTER — Ambulatory Visit
Admission: RE | Admit: 2014-08-28 | Discharge: 2014-08-28 | Disposition: A | Discharge status: Home / Self Care | Payer: Medicare Other | Source: Ambulatory Visit | Attending: Radiation Oncology | Admitting: Radiation Oncology

## 2014-08-28 ENCOUNTER — Telehealth: Payer: Self-pay

## 2014-08-28 ENCOUNTER — Encounter: Payer: Self-pay | Admitting: Radiation Oncology

## 2014-08-28 ENCOUNTER — Encounter: Payer: Medicare Other | Admitting: Physical Medicine & Rehabilitation

## 2014-08-28 VITALS — BP 127/52 | HR 77 | Temp 97.6°F | Resp 12 | Wt 166.7 lb

## 2014-08-28 DIAGNOSIS — Z9889 Other specified postprocedural states: Secondary | ICD-10-CM | POA: Diagnosis not present

## 2014-08-28 DIAGNOSIS — C419 Malignant neoplasm of bone and articular cartilage, unspecified: Secondary | ICD-10-CM

## 2014-08-28 DIAGNOSIS — C9000 Multiple myeloma not having achieved remission: Secondary | ICD-10-CM | POA: Diagnosis not present

## 2014-08-28 NOTE — Telephone Encounter (Signed)
Jeremiah Owens  [phone]  Left message to cancel appointment due to weathe, returned call and left message to call back at their convince to reschedule.

## 2014-08-28 NOTE — Progress Notes (Signed)
Department of Radiation Oncology  Phone:  [phone] Fax:        [phone]  Weekly Treatment Note    Name: Jeremiah Owens Date: 08/28/2014 MRN: [id number] DOB: [birth year]   Current dose: 12.5 Gy  Current fraction: 5   MEDICATIONS: Current Outpatient Prescriptions  Medication Sig Dispense Refill  . acyclovir (ZOVIRAX) 400 MG tablet Take 1 tablet (400 mg total) by mouth 2 (two) times daily. 60 tablet 2  . cholecalciferol (VITAMIN D) 1000 UNITS tablet Take 1,000 Units by mouth at bedtime.     Marland Kitchen dexamethasone (DECADRON) 4 MG tablet 10 tab every week, start with chemo 40 tablet 4  . hyaluronate sodium (RADIAPLEXRX) GEL Apply 1 application topically daily. Apply to treated area on skin after rad txs daily and prn    . HYDROcodone-acetaminophen (NORCO/VICODIN) 5-325 MG per tablet Take 1-2 tablets by mouth every 4 (four) hours as needed for moderate pain. 90 tablet 0  . HYDROCORTISONE, TOPICAL, 2.5 % SOLN Apply 1 application topically daily as needed.     Marland Kitchen levothyroxine (SYNTHROID, LEVOTHROID) 25 MCG tablet Take 1 tablet (25 mcg total) by mouth daily. 30 tablet 4  . methocarbamol (ROBAXIN) 500 MG tablet Take 1 tablet (500 mg total) by mouth every 6 (six) hours as needed for muscle spasms. (Patient not taking: Reported on 08/16/2014) 60 tablet 0  . metroNIDAZOLE (METROCREAM) 0.75 % cream Apply 1 application topically See admin instructions. Every time pt washes face, he put a small amount on his nose.    . Multiple Vitamins-Minerals (CENTRUM SILVER PO) Take 1 tablet by mouth daily.    Marland Kitchen omeprazole (PRILOSEC) 40 MG capsule Take 40 mg by mouth daily. In the afternoon  11  . Polyvinyl Alcohol-Povidone (REFRESH OP) Apply 1 drop to eye daily as needed (dry/irritated eyes).    . prochlorperazine (COMPAZINE) 10 MG tablet TAKE 1 TABLET BY MOUTH EVERY 6 HOURS AS NEEDED FOR NAUSEA AND VOMITING 30 tablet 0  . ranitidine (ZANTAC) 150 MG tablet Take 150 mg by mouth at bedtime.   11  . tamsulosin  (FLOMAX) 0.4 MG CAPS capsule Take 1 capsule (0.4 mg total) by mouth at bedtime. 30 capsule 3   No current facility-administered medications for this encounter.     ALLERGIES: Iodine   LABORATORY DATA:  Lab Results  Component Value Date   WBC 4.1 08/23/2014   HGB 11.8* 08/23/2014   HCT 35.3* 08/23/2014   MCV 98.6* 08/23/2014   PLT 155 08/23/2014   Lab Results  Component Value Date   NA 136 08/23/2014   K 4.2 08/23/2014   CL 97 07/04/2014   CO2 28 08/23/2014   Lab Results  Component Value Date   ALT 20 08/23/2014   AST 20 08/23/2014   ALKPHOS 70 08/23/2014   BILITOT 0.35 08/23/2014     NARRATIVE: Kjell Brannen was seen today for weekly treatment management. The chart was checked and the patient's films were reviewed.  He is currently in no pain. Reports having dull rib pain 1-2 in the evening. Pt complains of fatigue, weakness and loss of sleep, reports he has "restless legs." Reports he has periods of confusion, pt is currently alert and oriented to person, place and time. Pt denies dysphagia. Occasionally complains of heartburn.  PHYSICAL EXAMINATION: weight is 166 lb 11.2 oz (75.615 kg). His oral temperature is 97.6 F (36.4 C). His blood pressure is 127/52 and his pulse is 77. His respiration is 12 and oxygen saturation is 99%.  ASSESSMENT: The patient is doing satisfactorily with treatment.  PLAN: We will continue with the patient's radiation treatment as planned.

## 2014-08-28 NOTE — Progress Notes (Signed)
He is currently in no pain. Reports having dull rib pain 1-2 in the evening. Pt complains of fatigue, weakness and loss of sleep, reports he has "restless legs." Reports he has periods of confusion, pt is currently alert and oriented to person, place and time. Pt denies dysphagia. Occasionally complains of heartburn. Pt reports skin is warm dry and intact. BP 127/52 mmHg  Pulse 77  Temp(Src) 97.6 F (36.4 C) (Oral)  Resp 12  Wt 166 lb 11.2 oz (75.615 kg)  SpO2 99%

## 2014-08-29 ENCOUNTER — Ambulatory Visit
Admission: RE | Admit: 2014-08-29 | Discharge: 2014-08-29 | Disposition: A | Discharge status: Home / Self Care | Payer: Medicare Other | Source: Ambulatory Visit | Attending: Radiation Oncology | Admitting: Radiation Oncology

## 2014-08-29 DIAGNOSIS — C7951 Secondary malignant neoplasm of bone: Secondary | ICD-10-CM | POA: Diagnosis not present

## 2014-08-29 DIAGNOSIS — C9000 Multiple myeloma not having achieved remission: Secondary | ICD-10-CM | POA: Diagnosis not present

## 2014-08-29 DIAGNOSIS — Z9889 Other specified postprocedural states: Secondary | ICD-10-CM | POA: Diagnosis not present

## 2014-08-30 ENCOUNTER — Other Ambulatory Visit: Payer: Medicare Other

## 2014-08-30 ENCOUNTER — Ambulatory Visit: Payer: Medicare Other | Admitting: Physician Assistant

## 2014-08-30 ENCOUNTER — Ambulatory Visit (HOSPITAL_BASED_OUTPATIENT_CLINIC_OR_DEPARTMENT_OTHER): Payer: Medicare Other

## 2014-08-30 ENCOUNTER — Ambulatory Visit
Admission: RE | Admit: 2014-08-30 | Discharge: 2014-08-30 | Disposition: A | Discharge status: Home / Self Care | Payer: Medicare Other | Source: Ambulatory Visit | Attending: Radiation Oncology | Admitting: Radiation Oncology

## 2014-08-30 ENCOUNTER — Other Ambulatory Visit (HOSPITAL_BASED_OUTPATIENT_CLINIC_OR_DEPARTMENT_OTHER): Payer: Medicare Other

## 2014-08-30 DIAGNOSIS — Z9889 Other specified postprocedural states: Secondary | ICD-10-CM | POA: Diagnosis not present

## 2014-08-30 DIAGNOSIS — Z5112 Encounter for antineoplastic immunotherapy: Secondary | ICD-10-CM | POA: Diagnosis not present

## 2014-08-30 DIAGNOSIS — C9000 Multiple myeloma not having achieved remission: Secondary | ICD-10-CM | POA: Diagnosis not present

## 2014-08-30 DIAGNOSIS — C9002 Multiple myeloma in relapse: Secondary | ICD-10-CM

## 2014-08-30 LAB — CBC WITH DIFFERENTIAL/PLATELET
BASO%: 0.4 % (ref 0.0–2.0)
BASOS ABS: 0 10*3/uL (ref 0.0–0.1)
EOS%: 1.2 % (ref 0.0–7.0)
Eosinophils Absolute: 0.1 10*3/uL (ref 0.0–0.5)
HEMATOCRIT: 39.1 % (ref 38.4–49.9)
HGB: 12.5 g/dL — ABNORMAL LOW (ref 13.0–17.1)
LYMPH#: 0.4 10*3/uL — AB (ref 0.9–3.3)
LYMPH%: 7.5 % — AB (ref 14.0–49.0)
MCH: 32.1 pg (ref 27.2–33.4)
MCHC: 32 g/dL (ref 32.0–36.0)
MCV: 100.3 fL — AB (ref 79.3–98.0)
MONO#: 0.2 10*3/uL (ref 0.1–0.9)
MONO%: 3.8 % (ref 0.0–14.0)
NEUT#: 4.3 10*3/uL (ref 1.5–6.5)
NEUT%: 87.1 % — ABNORMAL HIGH (ref 39.0–75.0)
PLATELETS: 164 10*3/uL (ref 140–400)
RBC: 3.9 10*6/uL — ABNORMAL LOW (ref 4.20–5.82)
RDW: 13.9 % (ref 11.0–14.6)
WBC: 4.9 10*3/uL (ref 4.0–10.3)

## 2014-08-30 LAB — COMPREHENSIVE METABOLIC PANEL (CC13)
ALBUMIN: 3.5 g/dL (ref 3.5–5.0)
ALT: 16 U/L (ref 0–55)
ANION GAP: 7 meq/L (ref 3–11)
AST: 21 U/L (ref 5–34)
Alkaline Phosphatase: 82 U/L (ref 40–150)
BUN: 15.9 mg/dL (ref 7.0–26.0)
CALCIUM: 9 mg/dL (ref 8.4–10.4)
CO2: 26 mEq/L (ref 22–29)
CREATININE: 0.9 mg/dL (ref 0.7–1.3)
Chloride: 102 mEq/L (ref 98–109)
EGFR: 79 mL/min/{1.73_m2} — ABNORMAL LOW (ref >90)
Glucose: 82 mg/dl (ref 70–140)
POTASSIUM: 4.8 meq/L (ref 3.5–5.1)
Sodium: 136 mEq/L (ref 136–145)
TOTAL PROTEIN: 7.8 g/dL (ref 6.4–8.3)
Total Bilirubin: 0.53 mg/dL (ref 0.20–1.20)

## 2014-08-30 MED ORDER — BORTEZOMIB CHEMO SQ INJECTION 3.5 MG (2.5MG/ML)
1.3000 mg/m2 | Freq: Once | INTRAMUSCULAR | Status: AC
  Administered 2014-08-30: 2.5 mg via SUBCUTANEOUS
  Filled 2014-08-30: qty 2.5

## 2014-08-30 MED ORDER — ONDANSETRON HCL 8 MG PO TABS
8.0000 mg | ORAL_TABLET | Freq: Once | ORAL | Status: AC
  Administered 2014-08-30: 8 mg via ORAL

## 2014-08-30 MED ORDER — ONDANSETRON HCL 8 MG PO TABS
ORAL_TABLET | ORAL | Status: AC
  Filled 2014-08-30: qty 1

## 2014-08-30 NOTE — Patient Instructions (Signed)
[facility] Discharge Instructions for Patients Receiving Chemotherapy  Today you received the following chemotherapy agents velcade  To help prevent nausea and vomiting after your treatment, we encourage you to take your nausea medication as needed   If you develop nausea and vomiting that is not controlled by your nausea medication, call the clinic.   BELOW ARE SYMPTOMS THAT SHOULD BE REPORTED IMMEDIATELY:  *FEVER GREATER THAN 100.5 F  *CHILLS WITH OR WITHOUT FEVER  NAUSEA AND VOMITING THAT IS NOT CONTROLLED WITH YOUR NAUSEA MEDICATION  *UNUSUAL SHORTNESS OF BREATH  *UNUSUAL BRUISING OR BLEEDING  TENDERNESS IN MOUTH AND THROAT WITH OR WITHOUT PRESENCE OF ULCERS  *URINARY PROBLEMS  *BOWEL PROBLEMS  UNUSUAL RASH Items with * indicate a potential emergency and should be followed up as soon as possible.  Feel free to call the clinic you have any questions or concerns. The clinic phone number is [phone].

## 2014-08-31 ENCOUNTER — Other Ambulatory Visit: Payer: Self-pay | Admitting: *Deleted

## 2014-08-31 ENCOUNTER — Ambulatory Visit
Admission: RE | Admit: 2014-08-31 | Discharge: 2014-08-31 | Disposition: A | Discharge status: Home / Self Care | Payer: Medicare Other | Source: Ambulatory Visit | Attending: Radiation Oncology | Admitting: Radiation Oncology

## 2014-08-31 DIAGNOSIS — M4854XS Collapsed vertebra, not elsewhere classified, thoracic region, sequela of fracture: Secondary | ICD-10-CM | POA: Diagnosis not present

## 2014-08-31 DIAGNOSIS — C412 Malignant neoplasm of vertebral column: Secondary | ICD-10-CM | POA: Diagnosis not present

## 2014-08-31 DIAGNOSIS — Z483 Aftercare following surgery for neoplasm: Secondary | ICD-10-CM | POA: Diagnosis not present

## 2014-08-31 DIAGNOSIS — G839 Paralytic syndrome, unspecified: Secondary | ICD-10-CM | POA: Diagnosis not present

## 2014-08-31 DIAGNOSIS — Z9889 Other specified postprocedural states: Secondary | ICD-10-CM | POA: Diagnosis not present

## 2014-08-31 DIAGNOSIS — C9000 Multiple myeloma not having achieved remission: Secondary | ICD-10-CM | POA: Diagnosis not present

## 2014-08-31 DIAGNOSIS — N400 Enlarged prostate without lower urinary tract symptoms: Secondary | ICD-10-CM | POA: Diagnosis not present

## 2014-08-31 MED ORDER — METHOCARBAMOL 500 MG PO TABS: 500.0000 mg | ORAL_TABLET | Freq: Four times a day (QID) | ORAL | Status: DC | PRN

## 2014-08-31 NOTE — Telephone Encounter (Signed)
First RX refill printed out - destroyed.  Sent electronically

## 2014-08-31 NOTE — Telephone Encounter (Signed)
Rcvd faxed request - patient is to see Dr. Naaman Plummer on 09/05/14... Ok to refill.  Sent in electronically

## 2014-09-01 ENCOUNTER — Encounter: Payer: Self-pay | Admitting: Radiation Oncology

## 2014-09-01 ENCOUNTER — Ambulatory Visit
Admission: RE | Admit: 2014-09-01 | Discharge: 2014-09-01 | Disposition: A | Discharge status: Home / Self Care | Payer: Medicare Other | Source: Ambulatory Visit | Attending: Radiation Oncology | Admitting: Radiation Oncology

## 2014-09-01 ENCOUNTER — Ambulatory Visit: Payer: Medicare Other

## 2014-09-01 VITALS — BP 116/57 | HR 58 | Temp 98.1°F | Resp 20 | Wt 168.4 lb

## 2014-09-01 DIAGNOSIS — C7951 Secondary malignant neoplasm of bone: Secondary | ICD-10-CM | POA: Insufficient documentation

## 2014-09-01 DIAGNOSIS — Z510 Encounter for antineoplastic radiation therapy: Secondary | ICD-10-CM | POA: Insufficient documentation

## 2014-09-01 DIAGNOSIS — Z9889 Other specified postprocedural states: Secondary | ICD-10-CM | POA: Diagnosis not present

## 2014-09-01 DIAGNOSIS — Z79899 Other long term (current) drug therapy: Secondary | ICD-10-CM | POA: Insufficient documentation

## 2014-09-01 DIAGNOSIS — C9000 Multiple myeloma not having achieved remission: Secondary | ICD-10-CM | POA: Diagnosis not present

## 2014-09-01 DIAGNOSIS — C801 Malignant (primary) neoplasm, unspecified: Secondary | ICD-10-CM | POA: Insufficient documentation

## 2014-09-01 DIAGNOSIS — C419 Malignant neoplasm of bone and articular cartilage, unspecified: Secondary | ICD-10-CM

## 2014-09-01 NOTE — Progress Notes (Signed)
Weekly rad txs T$_T6 spine 9/10 completed,  Had chemotherapy this Wednesday, takes decadron per chemo protocol, appetite great, no thrush seen, c/o belching occasionally, mild pain in back,last pain med taken last night,  Using walker, energy level poor 11:55 AM

## 2014-09-01 NOTE — Progress Notes (Signed)
Department of Radiation Oncology  Phone:  [phone] Fax:        [phone]  Weekly Treatment Note    Name: Jeremiah Owens Date: 09/01/2014 MRN: [id number] DOB: 02/01/1930   Current dose: 22.5 Gy  Current fraction: 9   MEDICATIONS: Current Outpatient Prescriptions  Medication Sig Dispense Refill  . acyclovir (ZOVIRAX) 400 MG tablet Take 1 tablet (400 mg total) by mouth 2 (two) times daily. 60 tablet 2  . cholecalciferol (VITAMIN D) 1000 UNITS tablet Take 1,000 Units by mouth at bedtime.     Marland Kitchen dexamethasone (DECADRON) 4 MG tablet 10 tab every week, start with chemo 40 tablet 4  . hyaluronate sodium (RADIAPLEXRX) GEL Apply 1 application topically daily. Apply to treated area on skin after rad txs daily and prn    . HYDROcodone-acetaminophen (NORCO/VICODIN) 5-325 MG per tablet Take 1-2 tablets by mouth every 4 (four) hours as needed for moderate pain. 90 tablet 0  . HYDROCORTISONE, TOPICAL, 2.5 % SOLN Apply 1 application topically daily as needed.     Marland Kitchen levothyroxine (SYNTHROID, LEVOTHROID) 25 MCG tablet Take 1 tablet (25 mcg total) by mouth daily. 30 tablet 4  . methocarbamol (ROBAXIN) 500 MG tablet Take 1 tablet (500 mg total) by mouth every 6 (six) hours as needed for muscle spasms. 60 tablet 0  . metroNIDAZOLE (METROCREAM) 0.75 % cream Apply 1 application topically See admin instructions. Every time pt washes face, he put a small amount on his nose.    . Multiple Vitamins-Minerals (CENTRUM SILVER PO) Take 1 tablet by mouth daily.    Marland Kitchen omeprazole (PRILOSEC) 40 MG capsule Take 40 mg by mouth daily. In the afternoon  11  . Polyvinyl Alcohol-Povidone (REFRESH OP) Apply 1 drop to eye daily as needed (dry/irritated eyes).    . prochlorperazine (COMPAZINE) 10 MG tablet TAKE 1 TABLET BY MOUTH EVERY 6 HOURS AS NEEDED FOR NAUSEA AND VOMITING 30 tablet 0  . ranitidine (ZANTAC) 150 MG tablet Take 150 mg by mouth at bedtime.   11  . tamsulosin (FLOMAX) 0.4 MG CAPS capsule Take 1 capsule  (0.4 mg total) by mouth at bedtime. 30 capsule 3   No current facility-administered medications for this encounter.     ALLERGIES: Iodine   LABORATORY DATA:  Lab Results  Component Value Date   WBC 4.9 08/30/2014   HGB 12.5* 08/30/2014   HCT 39.1 08/30/2014   MCV 100.3* 08/30/2014   PLT 164 08/30/2014   Lab Results  Component Value Date   NA 136 08/30/2014   K 4.8 08/30/2014   CL 97 07/04/2014   CO2 26 08/30/2014   Lab Results  Component Value Date   ALT 16 08/30/2014   AST 21 08/30/2014   ALKPHOS 82 08/30/2014   BILITOT 0.53 08/30/2014     NARRATIVE: Jeremiah Owens was seen today for weekly treatment management. The chart was checked and the patient's films were reviewed.  Weekly rad txs T$_T6 spine 9/10 completed,  Had chemotherapy this Wednesday, takes decadron per chemo protocol, appetite great, no thrush seen, c/o belching occasionally, mild pain in back,last pain med taken last night,  Using walker, energy level poor   PHYSICAL EXAMINATION: weight is 168 lb 6.4 oz (76.386 kg). His oral temperature is 98.1 F (36.7 C). His blood pressure is 116/57 and his pulse is 58. His respiration is 20 and oxygen saturation is 98%.        ASSESSMENT: The patient is doing satisfactorily with treatment. He really denies any significant  symptoms in terms of esophagitis at this time. The patient knows to let us know if this changes.  PLAN: We will continue with the patient's radiation treatment as planned. The patient will be seen in one month.

## 2014-09-04 ENCOUNTER — Telehealth: Payer: Self-pay | Admitting: Internal Medicine

## 2014-09-04 ENCOUNTER — Encounter: Payer: Self-pay | Admitting: Occupational Therapy

## 2014-09-04 ENCOUNTER — Ambulatory Visit: Payer: Medicare Other

## 2014-09-04 ENCOUNTER — Encounter: Payer: Self-pay | Admitting: Radiation Oncology

## 2014-09-04 ENCOUNTER — Ambulatory Visit
Admission: RE | Admit: 2014-09-04 | Discharge: 2014-09-04 | Disposition: A | Discharge status: Home / Self Care | Payer: Medicare Other | Source: Ambulatory Visit | Attending: Radiation Oncology | Admitting: Radiation Oncology

## 2014-09-04 ENCOUNTER — Ambulatory Visit: Payer: Medicare Other | Attending: Physical Medicine & Rehabilitation | Admitting: Occupational Therapy

## 2014-09-04 DIAGNOSIS — R262 Difficulty in walking, not elsewhere classified: Secondary | ICD-10-CM | POA: Insufficient documentation

## 2014-09-04 DIAGNOSIS — R29898 Other symptoms and signs involving the musculoskeletal system: Secondary | ICD-10-CM

## 2014-09-04 DIAGNOSIS — C7951 Secondary malignant neoplasm of bone: Secondary | ICD-10-CM | POA: Insufficient documentation

## 2014-09-04 DIAGNOSIS — IMO0002 Reserved for concepts with insufficient information to code with codable children: Secondary | ICD-10-CM

## 2014-09-04 DIAGNOSIS — R6889 Other general symptoms and signs: Secondary | ICD-10-CM | POA: Diagnosis not present

## 2014-09-04 DIAGNOSIS — M2560 Stiffness of unspecified joint, not elsewhere classified: Secondary | ICD-10-CM | POA: Insufficient documentation

## 2014-09-04 DIAGNOSIS — C9000 Multiple myeloma not having achieved remission: Secondary | ICD-10-CM | POA: Diagnosis not present

## 2014-09-04 DIAGNOSIS — M25511 Pain in right shoulder: Secondary | ICD-10-CM | POA: Insufficient documentation

## 2014-09-04 DIAGNOSIS — Z7409 Other reduced mobility: Secondary | ICD-10-CM | POA: Diagnosis not present

## 2014-09-04 DIAGNOSIS — R279 Unspecified lack of coordination: Secondary | ICD-10-CM

## 2014-09-04 DIAGNOSIS — Z9889 Other specified postprocedural states: Secondary | ICD-10-CM | POA: Diagnosis not present

## 2014-09-04 DIAGNOSIS — R293 Abnormal posture: Secondary | ICD-10-CM

## 2014-09-04 DIAGNOSIS — D492 Neoplasm of unspecified behavior of bone, soft tissue, and skin: Secondary | ICD-10-CM | POA: Insufficient documentation

## 2014-09-04 NOTE — Therapy (Signed)
[facility] [address] [place], [place], [address] Phone: [phone]   Fax:  [phone]  Physical Therapy Evaluation  Patient Details  Name: Jeremiah Owens MRN: [id number] Date of Birth: [birth year] Referring Provider:  Wenda Low, MD  Encounter Date: 09/04/2014      PT End of Session - 09/04/14 1220    Visit Number 1   Number of Visits 17   Date for PT Re-Evaluation 11/03/14   Authorization Type Medicare Triad Primary, T[employer]are secondary G codes required   PT Start Time 0930   PT Stop Time 1015   PT Time Calculation (min) 45 min      Past Medical History  Diagnosis Date  . Compression fracture   . Melanoma   . Bone cancer     t_spine T-5  . Prostate cancer 2002  . Skin cancer 1994    melanoma  right neck   . Multiple myeloma   . Allergy   . Anxiety   . Thyroid disease     Past Surgical History  Procedure Laterality Date  . Hernia repair    . Posterior cervical fusion/foraminotomy N/A 06/17/2014    Procedure: Thoracic five laminectomy for epidural tumor resection Thoracic 4-7 posterior lateral arthrodesis, segmental pedicle screw fixation.;  Surgeon: Ashok Pall, MD;  Location: [facility];  Service: Neurosurgery;  Laterality: N/A;    There were no vitals taken for this visit.  Visit Diagnosis:  Difficulty walking  Lack of coordination  Weakness of left leg  Posture abnormality      Subjective Assessment - 09/04/14 0943    Symptoms  This is an 81 year old right-handed male with history of prostate cancer, diagnosed in 2002, underwent radiation treatment.  He was and independent ambulator  prior to November 2015, living with his wife. He was admitted on June 17, 2014 due to severe progressive debility.  Cardiac workup and esophageal unremarkable.  MRI of thoracic spine June 08, 2014, revealed a spinal cord compression at T5 with enhancing lesion, pathologic fracture at T5, metastatic  disease.  Underwent thoracic T5 laminectomy for epidural tumor resection, thoracic T4-T7 with arthrodesis segmental pedicle screw fixation on June 18, 2014, per Dr. Christella Noa. Pt was previously active at the [facility], participating in the balance classes there. He also volunteers with his church, with [employer] and would like to get back into that.    Pertinent History Prostate cancer, skin cancer   Patient Stated Goals To get rid of the walker, get back to volunteering with [employer], be able to go hunting again, and feel independent again   Currently in Pain? Yes   Pain Score 2    Pain Location Rib cage   Pain Orientation Mid   Pain Descriptors / Indicators Aching          [facility] PT Assessment - 09/04/14 0947    Assessment   Medical Diagnosis Metastatic prostate cancer to thoracic spine s/p decompression and fusion    Onset Date --  November 2015   Prior Therapy --  Hospital admission>inpatient rehab>home health> now outpatie   Precautions   Precautions Fall  Dr. Christella Noa released pt from spinal precautions   Restrictions   Weight Bearing Restrictions No   Home Environment   Living Enviornment Private residence   Living Arrangements Spouse/significant other   Available Help at Discharge Family   Type of [place] One level  [place] - 2 wheels;Walker - 4 wheels;Cane - single point   Prior Function   Level of Independence Independent with gait;Independent with transfers   Observation/Other Assessments   Focus on Therapeutic Outcomes (FOTO)  Functional Status 45   Other Surveys  Select   Activities of Balance Confidence Scale (ABC Scale)  10%   Sensation   Proprioception Impaired by gross assessment   Posture/Postural Control   Posture/Postural Control Postural limitations   Postural Limitations Rounded Shoulders;Forward head;Increased thoracic kyphosis;Posterior pelvic tilt  lateral  trunk flexion R   Flexibility   Soft Tissue Assessment /Muscle Lenght yes   Hamstrings tight  calves also tight bilaterally   Transfers   Transfers Sit to Stand;Stand to Sit  requires BUE push up and supervision or RW   Ambulation/Gait   Ambulation/Gait Yes   Ambulation/Gait Assistance 5: Supervision   Ambulation/Gait Assistance Details difficulty with turns   Ambulation Distance (Feet) 75 Feet   Assistive device Rolling walker   Gait Pattern Decreased stride length;Right flexed knee in stance;Left flexed knee in stance;Lateral trunk lean to right;Decreased trunk rotation;Wide base of support   Ambulation Surface Level;Indoor   Gait velocity 2.08 ft/sec   Standardized Balance Assessment   Standardized Balance Assessment Berg Balance Test;Timed Up and Go Test   Berg Balance Test   Sit to Stand Able to stand  independently using hands   Standing Unsupported Able to stand 2 minutes with supervision   Sitting with Back Unsupported but Feet Supported on Floor or Stool Able to sit safely and securely 2 minutes   Stand to Sit Controls descent by using hands   Transfers Able to transfer safely, definite need of hands   Standing Unsupported with Eyes Closed Able to stand 10 seconds with supervision   Standing Ubsupported with Feet Together Needs help to attain position but able to stand for 30 seconds with feet together   From Standing, Reach Forward with Outstretched Arm Can reach forward >12 cm safely (5")   From Standing Position, Pick up Object from Floor Able to pick up shoe, needs supervision   From Standing Position, Turn to Look Behind Over each Shoulder Needs assist to keep from losing balance and falling   Turn 360 Degrees Needs close supervision or verbal cueing   Standing Unsupported, Alternately Place Feet on Step/Stool Needs assistance to keep from falling or unable to try   Standing Unsupported, One Foot in Front Able to take small step independently and hold 30 seconds    Standing on One Leg Unable to try or needs assist to prevent fall   Total Score 29   Timed Up and Go Test   TUG Normal TUG   Normal TUG (seconds) 16.69  with RW                            PT Short Term Goals - 09/04/14 1227    PT SHORT TERM GOAL #1   Title Pt will demonstrate independence with HEP Target: 10/06/14   PT SHORT TERM GOAL #2   Title Pt will increase BERG score to 36/56 for improving balance.  Target: 10/06/14   PT SHORT TERM GOAL #3   Title Pt will Decrease TUG time to 14.69 seconds for improving efficiency with functional mobility using a single point cane. Target 10/06/14   PT SHORT TERM GOAL #4   Title Pt will ambulate independently on indoor level surfaces with cane.  Target:  10/06/14   PT SHORT TERM GOAL #5   Title Pt will perform sit to stand without UE support, independently.   Target: 10/06/14           PT Long Term Goals - 16-Sep-2014 1230    PT LONG TERM GOAL #1   Title Pt will increase Berg score to 49/56 for improved balance.  Target 11/03/14   PT LONG TERM GOAL #2   Title Pt will ambulate independently on indoor level surfaces without assistive device.  Target 11/03/14   PT LONG TERM GOAL #3   Title Pt will ambulate with suprervision on outdoor unlevel grass and concrete without assistive device. Target 11/03/14   PT LONG TERM GOAL #4   Title Pt will decrease TUG time to <13.5 seconds without assistive device for decreased fall risk and increased independence. Target 11/03/14   PT LONG TERM GOAL #5   Title Pt will increase gait speed to >2.63 ft/sec for independent community ambulator status. Target 11/03/14   Additional Long Term Goals   Additional Long Term Goals Yes   PT LONG TERM GOAL #6   Title Increase FOTO ABC to 30% for improved balance conficdence.               Plan - September 16, 2014 1222    Clinical Impression Statement Pt is an 81 y/o male with history of skin cancer and prostate cancer which recently spread to the thoracic spine. Pt had  tumor resection and spinal fusion and has had therapy inpatient rehab, home health and now presents for outpatient PT to address balance impairment, muscle weakness, gait impairment, posture impairment. Pt will benefit from skilled PT services to address these concerns. His goal is to ambulate independently on outdoor surfaces.   Rehab Potential Good   PT Frequency 2x / week   PT Duration 8 weeks   PT Treatment/Interventions ADLs/Self Care Home Management;Gait training;Therapeutic exercise;Patient/family education;Stair training;Balance training;Functional mobility training;Neuromuscular re-education;Manual techniques;Therapeutic activities;DME Instruction   PT Next Visit Plan HEP to include: hip strengthening, heel walking, anterior posterior limits of stability, turning in place, high knee marching, braiding; initiate training with SPC if appropriate          G-Codes - 09/16/2014 1253    Functional Assessment Tool Used Merrilee Jansky: 29/56   Functional Limitation Mobility: Walking and moving around   Mobility: Walking and Moving Around Current Status ([id number]) At least 40 percent but less than 60 percent impaired, limited or restricted   Mobility: Walking and Moving Around Goal Status [phone]) At least 20 percent but less than 40 percent impaired, limited or restricted       Problem List Patient Active Problem List   Diagnosis Date Noted  . Multiple myeloma   . Paraplegia 06/21/2014  . Postoperative anemia due to acute blood loss 06/21/2014  . 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 Spine metastasis 06/17/2014  . Thoracic spine tumor 06/17/2014  . Metastatic bone cancer 06/15/2014    Delrae Sawyers D 16-Sep-2014, 12:57 PM  [facility] [address] [place], [place], [address] Phone: [phone]   Fax:  [phone]

## 2014-09-04 NOTE — Telephone Encounter (Signed)
added pt appt....gv pt new sched

## 2014-09-04 NOTE — Therapy (Deleted)
[facility] [address] [place], [place], [address] Phone: [phone]   Fax:  [phone]  Physical Therapy Treatment  Patient Details  Name: Jeremiah Owens MRN: [id number] Date of Birth: [birth year] Referring Provider:  Wenda Low, MD  Encounter Date: 09/04/2014      PT End of Session - 09/04/14 1220    Visit Number 1   Number of Visits 17   Date for PT Re-Evaluation 11/03/14   Authorization Type Medicare [employer]ad Primary, Tricare secondary G codes required   PT Start Time 0930   PT Stop Time 1015   PT Time Calculation (min) 45 min      Past Medical History  Diagnosis Date  . Compression fracture   . Melanoma   . Bone cancer     t_spine T-5  . Prostate cancer 2002  . Skin cancer 1994    melanoma  right neck   . Multiple myeloma   . Allergy   . Anxiety   . Thyroid disease     Past Surgical History  Procedure Laterality Date  . Hernia repair    . Posterior cervical fusion/foraminotomy N/A 06/17/2014    Procedure: Thoracic five laminectomy for epidural tumor resection Thoracic 4-7 posterior lateral arthrodesis, segmental pedicle screw fixation.;  Surgeon: Ashok Pall, MD;  Location: [facility];  Service: Neurosurgery;  Laterality: N/A;    There were no vitals taken for this visit.  Visit Diagnosis:  Difficulty walking  Lack of coordination  Weakness of left leg  Posture abnormality      Subjective Assessment - 09/04/14 0943    Symptoms  This is an 81 year old right-handed male with history of prostate cancer, diagnosed in 2002, underwent radiation treatment.  He was and independent ambulator  prior to November 2015, living with his wife. He was admitted on June 17, 2014 due to severe progressive debility.  Cardiac workup and esophageal unremarkable.  MRI of thoracic spine June 08, 2014, revealed a spinal cord compression at T5 with enhancing lesion, pathologic fracture at T5, metastatic  disease.  Underwent thoracic T5 laminectomy for epidural tumor resection, thoracic T4-T7 with arthrodesis segmental pedicle screw fixation on June 18, 2014, per Dr. Christella Noa. Pt was previously active at the [facility], participating in the balance classes there. He also volunteers with his church, with [employer] and would like to get back into that.    Pertinent History Prostate cancer, skin cancer   Patient Stated Goals To get rid of the walker, get back to volunteering with [employer], be able to go hunting again, and feel independent again   Currently in Pain? Yes   Pain Score 2    Pain Location Rib cage   Pain Orientation Mid   Pain Descriptors / Indicators Aching          [facility] PT Assessment - 09/04/14 0947    Assessment   Medical Diagnosis Metastatic prostate cancer to thoracic spine s/p decompression and fusion    Onset Date --  November 2015   Prior Therapy --  Hospital admission>inpatient rehab>home health> now outpatie   Precautions   Precautions Fall  Dr. Christella Noa released pt from spinal precautions   Restrictions   Weight Bearing Restrictions No   Home Environment   Living Enviornment Private residence   Living Arrangements Spouse/significant other   Available Help at Discharge Family   Type of [place] One level  [place] - 2 wheels;[place] - 4 wheels;Cane - single point   Prior Function   Level of Independence Independent with gait;Independent with transfers   Observation/Other Assessments   Focus on Therapeutic Outcomes (FOTO)  Functional Status 45   Other Surveys  Select   Activities of Balance Confidence Scale (ABC Scale)  10%   Sensation   Proprioception Impaired by gross assessment   Posture/Postural Control   Posture/Postural Control Postural limitations   Postural Limitations Rounded Shoulders;Forward head;Increased thoracic kyphosis;Posterior pelvic tilt  lateral  trunk flexion R   Flexibility   Soft Tissue Assessment /Muscle Lenght yes   Hamstrings tight  calves also tight bilaterally   Transfers   Transfers Sit to Stand;Stand to Sit  requires BUE push up and supervision or RW   Ambulation/Gait   Ambulation/Gait Yes   Ambulation/Gait Assistance 5: Supervision   Ambulation/Gait Assistance Details difficulty with turns   Ambulation Distance (Feet) 75 Feet   Assistive device Rolling walker   Gait Pattern Decreased stride length;Right flexed knee in stance;Left flexed knee in stance;Lateral trunk lean to right;Decreased trunk rotation;Wide base of support   Ambulation Surface Level;Indoor   Gait velocity 2.08 ft/sec   Standardized Balance Assessment   Standardized Balance Assessment Berg Balance Test;Timed Up and Go Test   Berg Balance Test   Sit to Stand Able to stand  independently using hands   Standing Unsupported Able to stand 2 minutes with supervision   Sitting with Back Unsupported but Feet Supported on Floor or Stool Able to sit safely and securely 2 minutes   Stand to Sit Controls descent by using hands   Transfers Able to transfer safely, definite need of hands   Standing Unsupported with Eyes Closed Able to stand 10 seconds with supervision   Standing Ubsupported with Feet Together Needs help to attain position but able to stand for 30 seconds with feet together   From Standing, Reach Forward with Outstretched Arm Can reach forward >12 cm safely (5")   From Standing Position, Pick up Object from Floor Able to pick up shoe, needs supervision   From Standing Position, Turn to Look Behind Over each Shoulder Needs assist to keep from losing balance and falling   Turn 360 Degrees Needs close supervision or verbal cueing   Standing Unsupported, Alternately Place Feet on Step/Stool Needs assistance to keep from falling or unable to try   Standing Unsupported, One Foot in Front Able to take small step independently and hold 30 seconds    Standing on One Leg Unable to try or needs assist to prevent fall   Total Score 29   Timed Up and Go Test   TUG Normal TUG   Normal TUG (seconds) 16.69  with RW                            PT Short Term Goals - 09/04/14 1227    PT SHORT TERM GOAL #1   Title Pt will demonstrate independence with HEP Target: 10/06/14   PT SHORT TERM GOAL #2   Title Pt will increase BERG score to 36/56 for improving balance.  Target: 10/06/14   PT SHORT TERM GOAL #3   Title Pt will Decrease TUG time to 14.69 seconds for improving efficiency with functional mobility using a single point cane. Target 10/06/14   PT SHORT TERM GOAL #4   Title Pt will ambulate independently on indoor level surfaces with cane.  Target:  10/06/14   PT SHORT TERM GOAL #5   Title Pt will perform sit to stand without UE support, independently.   Target: 10/06/14           PT Long Term Goals - Sep 08, 2014 1230    PT LONG TERM GOAL #1   Title Pt will increase Berg score to 49/56 for improved balance.  Target 11/03/14   PT LONG TERM GOAL #2   Title Pt will ambulate independently on indoor level surfaces without assistive device.  Target 11/03/14   PT LONG TERM GOAL #3   Title Pt will ambulate with suprervision on outdoor unlevel grass and concrete without assistive device. Target 11/03/14   PT LONG TERM GOAL #4   Title Pt will decrease TUG time to <13.5 seconds without assistive device for decreased fall risk and increased independence. Target 11/03/14   PT LONG TERM GOAL #5   Title Pt will increase gait speed to >2.63 ft/sec for independent community ambulator status. Target 11/03/14   Additional Long Term Goals   Additional Long Term Goals Yes   PT LONG TERM GOAL #6   Title Increase FOTO ABC to 30% for improved balance conficdence.               Plan - 2014/09/08 1222    Clinical Impression Statement Pt is an 81 y/o male with history of skin cancer and prostate cancer which recently spread to the thoracic spine. Pt had  tumor resection and spinal fusion and has had therapy inpatient rehab, home health and now presents for outpatient PT to address balance impairment, muscle weakness, gait impairment, posture impairment. Pt will benefit from skilled PT services to address these concerns. His goal is to ambulate independently on outdoor surfaces.   Rehab Potential Good   PT Frequency 2x / week   PT Duration 8 weeks   PT Treatment/Interventions ADLs/Self Care Home Management;Gait training;Therapeutic exercise;Patient/family education;Stair training;Balance training;Functional mobility training;Neuromuscular re-education;Manual techniques;Therapeutic activities;DME Instruction   PT Next Visit Plan HEP to include: hip strengthening, heel walking, anterior posterior limits of stability, turning in place, high knee marching, braiding; initiate training with SPC if appropriate          G-Codes - 2014-09-08 1253    Functional Assessment Tool Used Merrilee Jansky: 29/56   Functional Limitation Mobility: Walking and moving around   Mobility: Walking and Moving Around Current Status ([id number]) At least 40 percent but less than 60 percent impaired, limited or restricted   Mobility: Walking and Moving Around Goal Status [phone]) At least 20 percent but less than 40 percent impaired, limited or restricted      Problem List Patient Active Problem List   Diagnosis Date Noted  . Multiple myeloma   . Paraplegia 06/21/2014  . Postoperative anemia due to acute blood loss 06/21/2014  . 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 Spine metastasis 06/17/2014  . Thoracic spine tumor 06/17/2014  . Metastatic bone cancer 06/15/2014    Delrae Sawyers D 09-08-14, 12:56 PM  [facility] [address] [place], [place], [address] Phone: [phone]   Fax:  [phone]

## 2014-09-04 NOTE — Therapy (Signed)
[facility] [address] [place], [place], [address] Phone: [phone]   Fax:  [phone]  Occupational Therapy Evaluation  Patient Details  Name: Jeremiah Owens MRN: [id number] Date of Birth: 1930/06/01 Referring Provider:  Wenda Low, MD  Encounter Date: 09/04/2014      OT End of Session - 09/04/14 1113    Visit Number 1  GI   Number of Visits 17   Date for OT Re-Evaluation 11/02/14   Authorization Type MCR - G CODE NEEDED   OT Start Time 0845   OT Stop Time 0930   OT Time Calculation (min) 45 min      Past Medical History  Diagnosis Date  . Compression fracture   . Melanoma   . Bone cancer     t_spine T-5  . Prostate cancer 2002  . Skin cancer 1994    melanoma  right neck   . Multiple myeloma   . Allergy   . Anxiety   . Thyroid disease     Past Surgical History  Procedure Laterality Date  . Hernia repair    . Posterior cervical fusion/foraminotomy N/A 06/17/2014    Procedure: Thoracic five laminectomy for epidural tumor resection Thoracic 4-7 posterior lateral arthrodesis, segmental pedicle screw fixation.;  Surgeon: Ashok Pall, MD;  Location: [facility];  Service: Neurosurgery;  Laterality: N/A;    There were no vitals taken for this visit.  Visit Diagnosis:  Activity intolerance - Plan: Ot plan of care cert/re-cert  Decreased functional mobility and endurance - Plan: Ot plan of care cert/re-cert  Stiffness of joint of upper extremity - Plan: Ot plan of care cert/re-cert      Subjective Assessment - 09/04/14 0853    Pertinent History active CA   Repetition Increases Symptoms   Patient Stated Goals to strengthen my arms   Currently in Pain? Yes   Pain Score 2   AT NIGHT   Pain Location Rib cage   Pain Descriptors / Indicators Aching;Restless   Pain Onset More than a month ago   Pain Frequency Rarely   Aggravating Factors  lying down   Pain Relieving Factors pain meds      O.T. Not addressing pain due to outside scope of practice.      [facility] OT Assessment - 09/04/14 0858    Assessment   Diagnosis metastatic prostate CA to thoracic spine level T5, s/p decompression and fusion T4-7 on 06/18/15   Precautions   Precautions Fall, active CA   Restrictions   Weight Bearing Restrictions No   Balance Screen   Has the patient fallen in the past 6 months Yes   How many times? 2   Has the patient had a decrease in activity level because of a fear of falling?  Yes   Is the patient reluctant to leave their home because of a fear of falling?  No   Home  Environment   Additional Comments 4W walker, rollator, w/c   Lives With Spouse  in 1 level home w/ ramp for 1 step.    Prior Function   Level of Independence Independent with basic ADLs;Independent with homemaking with ambulation   Vocation Retired   Animal nutritionist Regular height toilet;Grab bars   Warden/ranger Walk in shower;Grab bars  Built in [place] Engineer, petroleum, long handled sponge, sock aide   ADL comments Pt independent w/ BADLS using A/E. Pt performing  some cooking w/ supervision to min assist   IADL   Medication Management Is responsible for taking medication in correct dosages at correct time  occasionally forgets   Written Expression   Dominant Hand Right   Handwriting 90% legible   Sensation   Light Touch Appears Intact  in UE's.    Additional Comments Pt reports numbness in back but resolving   Coordination   9 Hole Peg Test (p) Rt = 26.68 sec. Lt = 32.56 sec.    Box and Blocks Rt = 48, Lt = 44   AROM   Overall AROM Comments RUE: WFL's. LUE: sh. flex = 90*, abd = 60*, ER approx. 50%, IR approx. 75% (premorbid). Elbow distally WFL's.    Strength   Overall Strength Comments Grip strength: Rt = 65 lbs, Lt = 60 lbs.                          OT Short Term Goals - 09/22/2014 1118    OT SHORT TERM GOAL #1    Title Independent w/ initial HEP for Lt shoulder (due 10/03/14)   Time 4   Period Weeks   Status New   OT SHORT TERM GOAL #2   Title Pt to perform 10 min . of UE exercise w/o rest    Time 4   Period Weeks   Status New   OT SHORT TERM GOAL #3   Title Pt to perform cooking task w/ supervision only using walker w/o LOB   Baseline requires assist    Time 4   Period Weeks   Status New           OT Long Term Goals - 22-Sep-2014 1121    OT LONG TERM GOAL #1   Title Independent w/ updated HEP prn (due 11/02/14)   Time 8  but may only need 6 weeks   Period Weeks   Status New   OT LONG TERM GOAL #2   Title Pt to perform dynamic standing tasks for 10 minutes or greater w/o LOB   Time 8   Period Weeks   Status New   OT LONG TERM GOAL #3   Title Pt/family to verbally id strategies for safety during IADLS    Time 8   Period Weeks   Status New               Plan - 09/22/14 1114    Clinical Impression Statement Pt is a 81 y.o. male who presents to outpatient rehab s/p T4-7 spinal decompression and fusion on 06/17/14 from metastatic CA to thoracic spine at T5 level. Pt presents w/ overall decreased endurance and strength/deconditioning and decreased balance which impedes ADL function.  Pt also with premorbid Lt shoulder ROM deficits however worsened since hospitalization and chemo.    Rehab Potential Good   OT Frequency 2x / week   OT Duration 8 weeks  plus evaluation (may only need 4-6 weeks with O.T. )   OT Treatment/Interventions Self-care/ADL training;Therapeutic exercise;Moist Heat;Neuromuscular education;Functional Mobility Training;Energy conservation;Patient/family education;DME and/or AE instruction;Passive range of motion;Therapeutic activities   Plan HEP for Lt shouder (? cane)   Consulted and Agree with Plan of Care Patient;Family member/caregiver          G-Codes - 09/22/2014 1127    Functional Assessment Tool Used Requires min assist for cooking tasks d/t  decreased mobility   Functional Limitation Mobility: Walking and moving around   Mobility: Walking and Moving  Around Current Status (T3391) At least 40 percent but less than 60 percent impaired, limited or restricted   Mobility: Walking and Moving Around Goal Status [phone]) At least 1 percent but less than 20 percent impaired, limited or restricted      Problem List Patient Active Problem List   Diagnosis Date Noted  . Multiple myeloma   . Paraplegia 06/21/2014  . Postoperative anemia due to acute blood loss 06/21/2014  . 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 Spine metastasis 06/17/2014  . Thoracic spine tumor 06/17/2014  . Metastatic bone cancer 06/15/2014    Carey Bullocks, OTR/L 09/04/2014, 11:32 AM  [facility] [address] [place], [place], [address] Phone: [phone]   Fax:  [phone]

## 2014-09-05 ENCOUNTER — Encounter: Payer: Medicare Other | Attending: Physical Medicine & Rehabilitation | Admitting: Physical Medicine & Rehabilitation

## 2014-09-05 ENCOUNTER — Encounter: Payer: Self-pay | Admitting: Physical Medicine & Rehabilitation

## 2014-09-05 VITALS — BP 122/52 | HR 74 | Resp 14

## 2014-09-05 DIAGNOSIS — R2689 Other abnormalities of gait and mobility: Secondary | ICD-10-CM | POA: Insufficient documentation

## 2014-09-05 DIAGNOSIS — G8220 Paraplegia, unspecified: Secondary | ICD-10-CM | POA: Diagnosis not present

## 2014-09-05 DIAGNOSIS — C9000 Multiple myeloma not having achieved remission: Secondary | ICD-10-CM

## 2014-09-05 DIAGNOSIS — R269 Unspecified abnormalities of gait and mobility: Secondary | ICD-10-CM

## 2014-09-05 NOTE — Patient Instructions (Signed)
ASK DR. MOHAMMED ABOUT CHECKING:  A TOTAL T4, FREE T4, T3 AND TSH WHEN YOU HAVE LABS DRAWN TOMORROW  PLEASE CALL ME WITH ANY PROBLEMS OR QUESTIONS (#[phone]).

## 2014-09-05 NOTE — Progress Notes (Signed)
Subjective:    Patient ID: Jeremiah Owens, male    DOB: [birth year], 81 y.o.   MRN: [id number]  HPI   Jeremiah Owens is back regarding his gait disorders and weakness related to his metatstatic prostate cancer to the spine. He has completed 10 treatments of radiation on Monday. He just completed another cycle of CTX as well. . He has a red mark on his back he's concerned about. He starts PT and OT at neuro rehab next week which he's excited about.   He feels generally fatigued and wonders when symptoms will improve.    Pain Inventory Average Pain 1 Pain Right Now 1 My pain is tingling  In the last 24 hours, has pain interfered with the following? General activity 0 Relation with others 0 Enjoyment of life 0 What TIME of day is your pain at its worst? night Sleep (in general) Fair  Pain is worse with: unsure Pain improves with: medication Relief from Meds: 5  Mobility walk with assistance use a walker how many minutes can you walk? 15 ability to climb steps?  yes do you drive?  no needs help with transfers Do you have any goals in this area?  yes  Function retired  Neuro/Psych tingling  Prior Studies hospital f/u  Physicians involved in your care hospital f/u   Family History  Problem Relation Age of Onset  . Cancer Mother     breast   History   Social History  . Marital Status: Married    Spouse Name: N/A    Number of Children: N/A  . Years of Education: N/A   Social History Main Topics  . Smoking status: Never Smoker   . Smokeless tobacco: None  . Alcohol Use: No  . Drug Use: No  . Sexual Activity: None   Other Topics Concern  . None   Social History Narrative   Past Surgical History  Procedure Laterality Date  . Hernia repair    . Posterior cervical fusion/foraminotomy N/A 06/17/2014    Procedure: Thoracic five laminectomy for epidural tumor resection Thoracic 4-7 posterior lateral arthrodesis, segmental pedicle screw fixation.;  Surgeon:  Ashok Pall, MD;  Location: [facility];  Service: Neurosurgery;  Laterality: N/A;   Past Medical History  Diagnosis Date  . Compression fracture   . Melanoma   . Bone cancer     t_spine T-5  . Prostate cancer 2002  . Skin cancer 1994    melanoma  right neck   . Multiple myeloma   . Allergy   . Anxiety   . Thyroid disease    BP 122/52 mmHg  Pulse 74  Resp 14  SpO2 97%  Opioid Risk Score:   Fall Risk Score: High Fall Risk (>13 points) (pt educated, given fall prevention pamphlet today)  Review of Systems  Gastrointestinal:       Bowel control problems  Neurological:       Tingling  All other systems reviewed and are negative.      Objective:   Physical Exam  HENT: dentition good Head: Normocephalic.  Right Ear: External ear normal.  Left Ear: External ear normal.  Eyes: Conjunctivae and EOM are normal. Pupils are equal, round, and reactive to light. Right eye exhibits no discharge. Left eye exhibits no discharge.  Neck: Normal range of motion. Neck supple. No JVD present. No tracheal deviation present. No thyromegaly present.  Cardiovascular: Normal rate, regular rhythm and normal heart sounds.  Respiratory: Effort normal and breath sounds  normal. No respiratory distress. He has no wheezes. He has no rales. He exhibits no tenderness.  GI: Soft. Bowel sounds are normal. He exhibits no distension. There is no tenderness. There is no rebound.  Musculoskeletal:  Head forward posture.  Lymphadenopathy:   He has no cervical adenopathy.  Neurological: He is alert and oriented to person, place, and time.  Follows commands. No cn findings. Left shoulder limited due to pain/ tightness. Otherwise UE 5/5. LE: HF 4-5. KE 4/5, ADF/APF 4/5. Decreased LT/proprioception in both feet. No resting tone. gait has improved. Stands with a kyphotic posture. Does shuffle the feet somewhat but there's less scissoring of the legs. Appeared fairly stable holding to his rollator.    Skin: Skin is warm.  Back incision is clean and intact-mild redness around scar-- Psychiatric: He has a normal mood and affect. His behavior is normal. Judgment and thought content normal  Assessment/Plan:  1. Functional deficits secondary to multiple myeloma to thoracic spine status post decompression and fusion T4-7 06/18/2014 -He has made nice progress. Continue with neuro-rehab to improve stamina, balance, posture and to advance to cane. 2.Onc management per Dr. Earlie Server  3. Pain Management: Hydrocodone and Robaxin as needed. Overall seems improved -good posture/ROM 4. Fatigue: I suspect there are multiple factors involved including his myelopathy, ctx, xrt. Also have to consider thyroid as well as he's been on thyroid supplement for sometime now. Asked him to discuss with onc team tomorrow as they will be drawing labs---would check TSH, T3, T4 and free T4.  Follow up with me in 2 months. Thirty minutes of face to face patient care time were spent during this visit. All questions were encouraged and answered. Marland Kitchen

## 2014-09-06 ENCOUNTER — Other Ambulatory Visit: Payer: Medicare Other

## 2014-09-06 ENCOUNTER — Ambulatory Visit (HOSPITAL_BASED_OUTPATIENT_CLINIC_OR_DEPARTMENT_OTHER): Payer: Medicare Other | Admitting: Physician Assistant

## 2014-09-06 ENCOUNTER — Ambulatory Visit (HOSPITAL_BASED_OUTPATIENT_CLINIC_OR_DEPARTMENT_OTHER): Payer: Medicare Other

## 2014-09-06 ENCOUNTER — Other Ambulatory Visit (HOSPITAL_BASED_OUTPATIENT_CLINIC_OR_DEPARTMENT_OTHER): Payer: Medicare Other

## 2014-09-06 ENCOUNTER — Encounter: Payer: Self-pay | Admitting: Physician Assistant

## 2014-09-06 VITALS — BP 126/58 | HR 83 | Temp 98.6°F | Resp 19 | Ht 69.0 in | Wt 167.8 lb

## 2014-09-06 DIAGNOSIS — C9000 Multiple myeloma not having achieved remission: Secondary | ICD-10-CM | POA: Diagnosis not present

## 2014-09-06 DIAGNOSIS — Z5112 Encounter for antineoplastic immunotherapy: Secondary | ICD-10-CM

## 2014-09-06 DIAGNOSIS — C9002 Multiple myeloma in relapse: Secondary | ICD-10-CM

## 2014-09-06 DIAGNOSIS — R5383 Other fatigue: Secondary | ICD-10-CM | POA: Diagnosis not present

## 2014-09-06 LAB — COMPREHENSIVE METABOLIC PANEL (CC13)
ALBUMIN: 3.3 g/dL — AB (ref 3.5–5.0)
ALK PHOS: 86 U/L (ref 40–150)
ALT: 15 U/L (ref 0–55)
AST: 19 U/L (ref 5–34)
Anion Gap: 8 mEq/L (ref 3–11)
BUN: 15.8 mg/dL (ref 7.0–26.0)
CALCIUM: 8.4 mg/dL (ref 8.4–10.4)
CO2: 25 mEq/L (ref 22–29)
Chloride: 103 mEq/L (ref 98–109)
Creatinine: 0.9 mg/dL (ref 0.7–1.3)
EGFR: 79 mL/min/{1.73_m2} — AB (ref >90)
Glucose: 109 mg/dl (ref 70–140)
Potassium: 4.5 mEq/L (ref 3.5–5.1)
SODIUM: 136 meq/L (ref 136–145)
Total Bilirubin: 0.37 mg/dL (ref 0.20–1.20)
Total Protein: 7.3 g/dL (ref 6.4–8.3)

## 2014-09-06 LAB — CBC WITH DIFFERENTIAL/PLATELET
BASO%: 0.2 % (ref 0.0–2.0)
Basophils Absolute: 0 10*3/uL (ref 0.0–0.1)
EOS%: 0.9 % (ref 0.0–7.0)
Eosinophils Absolute: 0 10*3/uL (ref 0.0–0.5)
HCT: 35.2 % — ABNORMAL LOW (ref 38.4–49.9)
HGB: 11.5 g/dL — ABNORMAL LOW (ref 13.0–17.1)
LYMPH#: 0.3 10*3/uL — AB (ref 0.9–3.3)
LYMPH%: 5.9 % — ABNORMAL LOW (ref 14.0–49.0)
MCH: 31.9 pg (ref 27.2–33.4)
MCHC: 32.6 g/dL (ref 32.0–36.0)
MCV: 97.8 fL (ref 79.3–98.0)
MONO#: 0.2 10*3/uL (ref 0.1–0.9)
MONO%: 4.9 % (ref 0.0–14.0)
NEUT%: 88.1 % — ABNORMAL HIGH (ref 39.0–75.0)
NEUTROS ABS: 4.2 10*3/uL (ref 1.5–6.5)
Platelets: 140 10*3/uL (ref 140–400)
RBC: 3.6 10*6/uL — ABNORMAL LOW (ref 4.20–5.82)
RDW: 13.7 % (ref 11.0–14.6)
WBC: 4.8 10*3/uL (ref 4.0–10.3)

## 2014-09-06 MED ORDER — ONDANSETRON HCL 8 MG PO TABS
8.0000 mg | ORAL_TABLET | Freq: Once | ORAL | Status: AC
  Administered 2014-09-06: 8 mg via ORAL

## 2014-09-06 MED ORDER — BORTEZOMIB CHEMO SQ INJECTION 3.5 MG (2.5MG/ML)
1.3000 mg/m2 | Freq: Once | INTRAMUSCULAR | Status: AC
  Administered 2014-09-06: 2.5 mg via SUBCUTANEOUS
  Filled 2014-09-06: qty 2.5

## 2014-09-06 MED ORDER — ONDANSETRON HCL 8 MG PO TABS
ORAL_TABLET | ORAL | Status: AC
  Filled 2014-09-06: qty 1

## 2014-09-06 NOTE — Patient Instructions (Signed)
Continue labs and chemotherapy as scheduled Follow up in 3 weeks with repeat protein studies to re-evaluate your disease 

## 2014-09-06 NOTE — Patient Instructions (Signed)
[facility] Discharge Instructions for Patients Receiving Chemotherapy  Today you received the following chemotherapy agents Velcade.  To help prevent nausea and vomiting after your treatment, we encourage you to take your nausea medication as directed.    If you develop nausea and vomiting that is not controlled by your nausea medication, call the clinic.   BELOW ARE SYMPTOMS THAT SHOULD BE REPORTED IMMEDIATELY:  *FEVER GREATER THAN 100.5 F  *CHILLS WITH OR WITHOUT FEVER  NAUSEA AND VOMITING THAT IS NOT CONTROLLED WITH YOUR NAUSEA MEDICATION  *UNUSUAL SHORTNESS OF BREATH  *UNUSUAL BRUISING OR BLEEDING  TENDERNESS IN MOUTH AND THROAT WITH OR WITHOUT PRESENCE OF ULCERS  *URINARY PROBLEMS  *BOWEL PROBLEMS  UNUSUAL RASH Items with * indicate a potential emergency and should be followed up as soon as possible.  Feel free to call the clinic you have any questions or concerns. The clinic phone number is [phone].    

## 2014-09-06 NOTE — Progress Notes (Addendum)
[facility] [phone]   Fax:([phone]) [place] [employer], [address] [place] [address]  DIAGNOSIS: Multiple myeloma diagnosed in November 2015  PRIOR THERAPY: Status post Thoracic five laminectomy for epidural tumor resection, Thoracic 4-7 posterior lateral arthrodesis,  segmental pedicle screw fixation T4-T7 Globus instrumentation under the care of Dr. Christella Noa on 06/18/2014.  CURRENT THERAPY: Systemic chemotherapy with Velcade 1.3 MG/M2 subcutaneously weekly in addition to Decadron 40 mg by mouth on weekly basis. Status post 7 cycles.  INTERVAL HISTORY: Jeremiah Owens 81 y.o. male returns to the clinic today for follow-up visit accompanied by his wife. The patient is currently on treatment with weekly subcutaneous Velcade and Decadron status post 7 cycles and tolerating the treatment fairly well. He does report some constipation that he is currently managing with a combination of prune juice and eating dried (well. He does complain of some fatigue. He continues with physical therapy and was recommended by his physical and rehabilitation physician Dr. Tessa Lerner that perhaps his thyroid needed to be checked to see if there are any abnormalities there that may be contributing to his fatigue. He denied having any significant nausea or vomiting, no fever or chills. He denied having any significant weight loss or night sweats. He completed his palliative radiotherapy to the thoracic spine lesions on 09/04/2014 and is to follow-up with Dr. Lisbeth Renshaw in one month.  He continues to have some weakness in the left lower extremity and currently undergoing physical therapy.he will begin some neuro physical therapy on an outpatient basis soon. He denied having any significant chest pain, shortness breath, cough or hemoptysis. He is here today to start cycle #8 of his weekly systemic therapy.  MEDICAL HISTORY: Past Medical History   Diagnosis Date  . Compression fracture   . Melanoma   . Bone cancer     t_spine T-5  . Prostate cancer 2002  . Skin cancer 1994    melanoma  right neck   . Multiple myeloma   . Allergy   . Anxiety   . Thyroid disease     ALLERGIES:  is allergic to iodine.  MEDICATIONS:  Current Outpatient Prescriptions  Medication Sig Dispense Refill  . acyclovir (ZOVIRAX) 400 MG tablet Take 1 tablet (400 mg total) by mouth 2 (two) times daily. 60 tablet 2  . cholecalciferol (VITAMIN D) 1000 UNITS tablet Take 1,000 Units by mouth at bedtime.     Marland Kitchen dexamethasone (DECADRON) 4 MG tablet 10 tab every week, start with chemo 40 tablet 4  . hyaluronate sodium (RADIAPLEXRX) GEL Apply 1 application topically daily. Apply to treated area on skin after rad txs daily and prn    . HYDROcodone-acetaminophen (NORCO/VICODIN) 5-325 MG per tablet Take 1-2 tablets by mouth every 4 (four) hours as needed for moderate pain. 90 tablet 0  . HYDROCORTISONE, TOPICAL, 2.5 % SOLN Apply 1 application topically daily as needed.     Marland Kitchen levothyroxine (SYNTHROID, LEVOTHROID) 25 MCG tablet Take 1 tablet (25 mcg total) by mouth daily. 30 tablet 4  . methocarbamol (ROBAXIN) 500 MG tablet Take 1 tablet (500 mg total) by mouth every 6 (six) hours as needed for muscle spasms. 60 tablet 0  . metroNIDAZOLE (METROCREAM) 0.75 % cream Apply 1 application topically See admin instructions. Every time pt washes face, he put a small amount on his nose.    . Multiple Vitamins-Minerals (CENTRUM SILVER PO) Take 1 tablet by mouth daily.    Marland Kitchen  Polyvinyl Alcohol-Povidone (REFRESH OP) Apply 1 drop to eye daily as needed (dry/irritated eyes).    . prochlorperazine (COMPAZINE) 10 MG tablet TAKE 1 TABLET BY MOUTH EVERY 6 HOURS AS NEEDED FOR NAUSEA AND VOMITING 30 tablet 0  . ranitidine (ZANTAC) 150 MG tablet Take 150 mg by mouth at bedtime.   11  . tamsulosin (FLOMAX) 0.4 MG CAPS capsule Take 1 capsule (0.4 mg total) by mouth at bedtime. 30 capsule 3  .  omeprazole (PRILOSEC) 40 MG capsule Take 40 mg by mouth daily. In the afternoon  11   No current facility-administered medications for this visit.    SURGICAL HISTORY:  Past Surgical History  Procedure Laterality Date  . Hernia repair    . Posterior cervical fusion/foraminotomy N/A 06/17/2014    Procedure: Thoracic five laminectomy for epidural tumor resection Thoracic 4-7 posterior lateral arthrodesis, segmental pedicle screw fixation.;  Surgeon: Ashok Pall, MD;  Location: [facility];  Service: Neurosurgery;  Laterality: N/A;    REVIEW OF SYSTEMS:  Constitutional: positive for fatigue Eyes: negative Ears, nose, mouth, throat, and face: negative Respiratory: negative Cardiovascular: negative Gastrointestinal: negative Genitourinary:negative Integument/breast: negative Hematologic/lymphatic: negative Musculoskeletal:positive for muscle weakness Neurological: positive for weakness Behavioral/Psych: negative Endocrine: negative Allergic/Immunologic: negative   PHYSICAL EXAMINATION: General appearance: alert, cooperative, fatigued and no distress Head: Normocephalic, without obvious abnormality, atraumatic Neck: no adenopathy, no JVD, supple, symmetrical, trachea midline and thyroid not enlarged, symmetric, no tenderness/mass/nodules Lymph nodes: Cervical, supraclavicular, and axillary nodes normal. Resp: clear to auscultation bilaterally Back: symmetric, no curvature. ROM normal. No CVA tenderness. Cardio: regular rate and rhythm, S1, S2 normal, no murmur, click, rub or gallop GI: soft, non-tender; bowel sounds normal; no masses,  no organomegaly Extremities: extremities normal, atraumatic, no cyanosis or edema Neurologic: Motor: Weakness in the left foot  ECOG PERFORMANCE STATUS: 1 - Symptomatic but completely ambulatory  Blood pressure 126/58, pulse 83, temperature 98.6 F (37 C), temperature source Oral, resp. rate 19, height _0  (1.753 m), weight 167 lb 12.8 oz  (76.114 kg), SpO2 99 %.  LABORATORY DATA: Lab Results  Component Value Date   WBC 4.8 09/06/2014   HGB 11.5* 09/06/2014   HCT 35.2* 09/06/2014   MCV 97.8 09/06/2014   PLT 140 09/06/2014      Chemistry      Component Value Date/Time   NA 136 09/06/2014 1135   NA 133* 07/04/2014 0430   K 4.5 09/06/2014 1135   K 4.1 07/04/2014 0430   CL 97 07/04/2014 0430   CO2 25 09/06/2014 1135   CO2 27 07/04/2014 0430   BUN 15.8 09/06/2014 1135   BUN 18 07/04/2014 0430   CREATININE 0.9 09/06/2014 1135   CREATININE 0.90 07/04/2014 0430      Component Value Date/Time   CALCIUM 8.4 09/06/2014 1135   CALCIUM 8.9 07/04/2014 0430   ALKPHOS 86 09/06/2014 1135   ALKPHOS 47 06/21/2014 0406   AST 19 09/06/2014 1135   AST 31 06/21/2014 0406   ALT 15 09/06/2014 1135   ALT 29 06/21/2014 0406   BILITOT 0.37 09/06/2014 1135   BILITOT <0.2* 06/21/2014 0406       RADIOGRAPHIC STUDIES: No results found.  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 He is currently on treatment with subcutaneous Velcade and Decadron status post 7 weekly doses. The patient is tolerating his  treatment fairly well with no significant adverse effects. Patient was discussed with and also seen by Dr. Julien Nordmann. He will continue on his current treatment with weekly Velcade and dexamethasone. He will receive cycle #8 today. We will plan to repeat his protein studies in 2 weeks and have him follow-up in 3 weeks discuss the results of the protein studies and for reevaluation of his disease. To further evaluate his complaints of fatigue we will draw a TSH today. Should the results be abnormal they will be referred to his primary care physician for further evaluation and management.  He was advised to call immediately if he has any concerning symptoms in the  interval. The patient voices understanding of current disease status and treatment options and is in agreement with the current care plan.  All questions were answered. The patient knows to call the clinic with any problems, questions or concerns. We can certainly see the patient much sooner if necessary.  Carlton Adam, PA-C 09/06/2014  ADDENDUM: Hematology/Oncology Attending: I had a face to face encounter with the patient today. I recommended his care plan. This is a very pleasant 81 years old white male with multiple myeloma currently undergoing systemic chemotherapy with weekly subcutaneous Velcade and Decadron status post 7 doses and tolerating his treatment fairly well with no significant adverse effects except for mild fatigue. I recommended for the patient to proceed with cycle #8 today as a scheduled. We will see him back for follow-up visit in 3 weeks after repeating myeloma panel for reevaluation of his disease. He was advised to call immediately if he has any concerning symptoms in the interval.  Disclaimer: This note was dictated with voice recognition software. Similar sounding words can inadvertently be transcribed and may not be corrected upon review. Eilleen Kempf., MD 09/06/2014

## 2014-09-07 LAB — TSH CHCC: TSH: 2.011 m(IU)/L (ref 0.320–4.118)

## 2014-09-13 ENCOUNTER — Ambulatory Visit (HOSPITAL_BASED_OUTPATIENT_CLINIC_OR_DEPARTMENT_OTHER): Payer: Medicare Other

## 2014-09-13 ENCOUNTER — Other Ambulatory Visit (HOSPITAL_BASED_OUTPATIENT_CLINIC_OR_DEPARTMENT_OTHER): Payer: Medicare Other

## 2014-09-13 DIAGNOSIS — Z5112 Encounter for antineoplastic immunotherapy: Secondary | ICD-10-CM | POA: Diagnosis not present

## 2014-09-13 DIAGNOSIS — C9002 Multiple myeloma in relapse: Secondary | ICD-10-CM

## 2014-09-13 DIAGNOSIS — C9000 Multiple myeloma not having achieved remission: Secondary | ICD-10-CM

## 2014-09-13 LAB — COMPREHENSIVE METABOLIC PANEL (CC13)
ALBUMIN: 3.4 g/dL — AB (ref 3.5–5.0)
ALT: 18 U/L (ref 0–55)
ANION GAP: 9 meq/L (ref 3–11)
AST: 19 U/L (ref 5–34)
Alkaline Phosphatase: 91 U/L (ref 40–150)
BILIRUBIN TOTAL: 0.3 mg/dL (ref 0.20–1.20)
BUN: 22.6 mg/dL (ref 7.0–26.0)
CO2: 25 meq/L (ref 22–29)
Calcium: 9 mg/dL (ref 8.4–10.4)
Chloride: 104 mEq/L (ref 98–109)
Creatinine: 1 mg/dL (ref 0.7–1.3)
EGFR: 69 mL/min/{1.73_m2} — AB (ref >90)
GLUCOSE: 123 mg/dL (ref 70–140)
POTASSIUM: 4.2 meq/L (ref 3.5–5.1)
Sodium: 138 mEq/L (ref 136–145)
TOTAL PROTEIN: 7.4 g/dL (ref 6.4–8.3)

## 2014-09-13 LAB — CBC WITH DIFFERENTIAL/PLATELET
BASO%: 0.3 % (ref 0.0–2.0)
Basophils Absolute: 0 10*3/uL (ref 0.0–0.1)
EOS%: 2 % (ref 0.0–7.0)
Eosinophils Absolute: 0.1 10*3/uL (ref 0.0–0.5)
HCT: 37.2 % — ABNORMAL LOW (ref 38.4–49.9)
HGB: 12 g/dL — ABNORMAL LOW (ref 13.0–17.1)
LYMPH%: 11.3 % — AB (ref 14.0–49.0)
MCH: 31.8 pg (ref 27.2–33.4)
MCHC: 32.2 g/dL (ref 32.0–36.0)
MCV: 98.8 fL — AB (ref 79.3–98.0)
MONO#: 0.5 10*3/uL (ref 0.1–0.9)
MONO%: 10.2 % (ref 0.0–14.0)
NEUT%: 76.2 % — ABNORMAL HIGH (ref 39.0–75.0)
NEUTROS ABS: 3.7 10*3/uL (ref 1.5–6.5)
Platelets: 140 10*3/uL (ref 140–400)
RBC: 3.77 10*6/uL — AB (ref 4.20–5.82)
RDW: 14 % (ref 11.0–14.6)
WBC: 4.9 10*3/uL (ref 4.0–10.3)
lymph#: 0.6 10*3/uL — ABNORMAL LOW (ref 0.9–3.3)

## 2014-09-13 MED ORDER — ONDANSETRON HCL 8 MG PO TABS
8.0000 mg | ORAL_TABLET | Freq: Once | ORAL | Status: AC
  Administered 2014-09-13: 8 mg via ORAL

## 2014-09-13 MED ORDER — ONDANSETRON HCL 8 MG PO TABS
ORAL_TABLET | ORAL | Status: AC
  Filled 2014-09-13: qty 1

## 2014-09-13 MED ORDER — BORTEZOMIB CHEMO SQ INJECTION 3.5 MG (2.5MG/ML)
1.3000 mg/m2 | Freq: Once | INTRAMUSCULAR | Status: AC
  Administered 2014-09-13: 2.5 mg via SUBCUTANEOUS
  Filled 2014-09-13: qty 2.5

## 2014-09-13 NOTE — Patient Instructions (Signed)
[facility] Discharge Instructions for Patients Receiving Chemotherapy  Today you received the following chemotherapy agents velcade  To help prevent nausea and vomiting after your treatment, we encourage you to take your nausea medication as needed   If you develop nausea and vomiting that is not controlled by your nausea medication, call the clinic.   BELOW ARE SYMPTOMS THAT SHOULD BE REPORTED IMMEDIATELY:  *FEVER GREATER THAN 100.5 F  *CHILLS WITH OR WITHOUT FEVER  NAUSEA AND VOMITING THAT IS NOT CONTROLLED WITH YOUR NAUSEA MEDICATION  *UNUSUAL SHORTNESS OF BREATH  *UNUSUAL BRUISING OR BLEEDING  TENDERNESS IN MOUTH AND THROAT WITH OR WITHOUT PRESENCE OF ULCERS  *URINARY PROBLEMS  *BOWEL PROBLEMS  UNUSUAL RASH Items with * indicate a potential emergency and should be followed up as soon as possible.  Feel free to call the clinic you have any questions or concerns. The clinic phone number is [phone].

## 2014-09-14 ENCOUNTER — Ambulatory Visit: Payer: Medicare Other | Admitting: Occupational Therapy

## 2014-09-14 ENCOUNTER — Encounter: Payer: Self-pay | Admitting: Occupational Therapy

## 2014-09-14 ENCOUNTER — Ambulatory Visit: Payer: Medicare Other

## 2014-09-14 DIAGNOSIS — C7951 Secondary malignant neoplasm of bone: Secondary | ICD-10-CM | POA: Diagnosis not present

## 2014-09-14 DIAGNOSIS — R29898 Other symptoms and signs involving the musculoskeletal system: Secondary | ICD-10-CM

## 2014-09-14 DIAGNOSIS — R6889 Other general symptoms and signs: Secondary | ICD-10-CM | POA: Diagnosis not present

## 2014-09-14 DIAGNOSIS — D492 Neoplasm of unspecified behavior of bone, soft tissue, and skin: Secondary | ICD-10-CM | POA: Diagnosis not present

## 2014-09-14 DIAGNOSIS — Z7409 Other reduced mobility: Secondary | ICD-10-CM | POA: Diagnosis not present

## 2014-09-14 DIAGNOSIS — R262 Difficulty in walking, not elsewhere classified: Secondary | ICD-10-CM

## 2014-09-14 DIAGNOSIS — R293 Abnormal posture: Secondary | ICD-10-CM

## 2014-09-14 DIAGNOSIS — M2560 Stiffness of unspecified joint, not elsewhere classified: Secondary | ICD-10-CM

## 2014-09-14 DIAGNOSIS — IMO0002 Reserved for concepts with insufficient information to code with codable children: Secondary | ICD-10-CM

## 2014-09-14 DIAGNOSIS — R279 Unspecified lack of coordination: Secondary | ICD-10-CM

## 2014-09-14 DIAGNOSIS — C9000 Multiple myeloma not having achieved remission: Secondary | ICD-10-CM | POA: Diagnosis not present

## 2014-09-14 NOTE — Patient Instructions (Addendum)
Pt was instructed in HEP for A/AROM today, cane exercises were initially attempted but pre-morbid h/o L shoulder impairment did not allow for standard cane exercises. Pt was educated to perform AROM RUE shoulder flexion/reach overhead x10-15 reps (avoid w/ Left) while seated. 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 Pt verbalized understanding of home program and returned demonstration in clinic today. A handout was issued to pt.

## 2014-09-14 NOTE — Therapy (Signed)
[facility] [address] [place], [place], [address] Phone: [phone]   Fax:  [phone]  Occupational Therapy Treatment  Patient Details  Name: Jeremiah Owens MRN: [id number] Date of Birth: 1930/05/01 Referring Provider:  Wenda Low, MD  Encounter Date: 09/14/2014      OT End of Session - 09/14/14 1312    Visit Number 2  G - 2   Number of Visits 17   Date for OT Re-Evaluation 11/02/14   Authorization Type MCR - G CODE NEEDED   OT Start Time 0932   OT Stop Time 1016   OT Time Calculation (min) 44 min   Activity Tolerance Patient tolerated treatment well   Behavior During Therapy [facility] for tasks assessed/performed      Past Medical History  Diagnosis Date  . Compression fracture   . Melanoma   . Bone cancer     t_spine T-5  . Prostate cancer 2002  . Skin cancer 1994    melanoma  right neck   . Multiple myeloma   . Allergy   . Anxiety   . Thyroid disease     Past Surgical History  Procedure Laterality Date  . Hernia repair    . Posterior cervical fusion/foraminotomy N/A 06/17/2014    Procedure: Thoracic five laminectomy for epidural tumor resection Thoracic 4-7 posterior lateral arthrodesis, segmental pedicle screw fixation.;  Surgeon: Ashok Pall, MD;  Location: [place];  Service: Neurosurgery;  Laterality: N/A;    There were no vitals taken for this visit.  Visit Diagnosis:  Activity intolerance  Decreased functional mobility and endurance  Stiffness of joint of upper extremity      Subjective Assessment - 09/14/14 0941    Symptoms Pt reports that he has h/o weakness bilateral shoulders (RUE WFL's for AROM, LUE impaired premorbidly ~75%), he would like to increase AROM and strength as able.   Pertinent History active CA   Repetition Increases Symptoms   Patient Stated Goals To strengthen my arms   Currently in Pain? No/denies   Pain Score 0-No pain                 OT  Treatments/Exercises (OP) - 09/14/14 0001    Shoulder Exercises: Stretch   Elbow Flexion AROM;Both;15 reps;Seated;Bar weights/barbell  bicep curls w/ 2# weight bilaterally   Table Stretch - Flexion Other (comment)  10 reps seated at table with towel, hold x5 sec each w/    Table Stretch -Flexion Limitations vc's for positioning and to pinch shoulder blades together after flexion/stretch w/ towel at tabletop.   Other Shoulder Stretches Cane Exercises: Overhead AROM RUE only (premorbid ~75% LUE); ABD to right; Scapular stretch bilaterally w/ cane and chest press bilaterally w/ cane, bicep curls bilaterally w/ cane  X10-15 reps ea seated, vc's for performance and positioning.   Other Shoulder Stretches Grip w/ putty (medium soft red) right and left x2 sets 15 each (Pt education to avoid stress to [place] of thumb as pt has h/o arthritis R)  Pt performed 2 sets 15 ea R & L hands w/o c/o     Pt was instructed in HEP for A/AROM today, cane exercises were initially attempted but pre-morbid h/o L shoulder impairment did not allow for standard cane exercises. Pt was educated to perform AROM RUE shoulder flexion/reach overhead x10-15 reps (avoid w/ Left) while seated. 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 Pt verbalized understanding of home program and returned demonstration in clinic today. A handout was issued to pt.           OT Education - 09/14/14 1311    Education provided Yes   Education Details Home program for adapted cane exercises and putty ex's   Person(s) Educated Patient   Methods Demonstration;Explanation;Handout    Comprehension Verbalized understanding;Returned demonstration          OT Short Term Goals - 09/04/14 1118    OT SHORT TERM GOAL #1   Title Independent w/ initial HEP for Lt shoulder (due 10/03/14)   Time 4   Period Weeks   Status New   OT SHORT TERM GOAL #2   Title Pt to perform 10 min . of UE exercise w/o rest    Time 4   Period Weeks   Status New   OT SHORT TERM GOAL #3   Title Pt to perform cooking task w/ supervision only using walker w/o LOB   Baseline requires assist    Time 4   Period Weeks   Status New           OT Long Term Goals - 09/04/14 1121    OT LONG TERM GOAL #1   Title Independent w/ updated HEP prn (due 11/02/14)   Time 8  but may only need 6 weeks   Period Weeks   Status New   OT LONG TERM GOAL #2   Title Pt to perform dynamic standing tasks for 10 minutes or greater w/o LOB   Time 8   Period Weeks   Status New   OT LONG TERM GOAL #3   Title Pt/family to verbally id strategies for safety during IADLS    Time 8   Period Weeks   Status New               Plan - 09/14/14 1314    Clinical Impression Statement Pt will benefit from continued skilled OT in out-pt setting s/p T4-7 spinal decompression and fusionon 06/17/14 from metastatic CA to thoracic spine at T5 level, to assist w/ increased strength, endurance and deconditioning. Monitor premorbid left shoulder ROM deficits.   Rehab Potential Good   OT Frequency 2x / week   OT Duration 8 weeks   OT Treatment/Interventions Self-care/ADL training;Therapeutic exercise;Moist Heat;Neuromuscular education;Functional Mobility Training;Energy conservation;Patient/family education;DME and/or AE instruction;Passive range of motion;Therapeutic activities   Consulted and Agree with Plan of Care Patient        Problem List Patient Active Problem List   Diagnosis Date Noted  . Gait disorder 09/05/2014  . Multiple myeloma   . Paraplegia 06/21/2014  . Postoperative anemia due to acute blood loss  06/21/2014  . 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 Spine metastasis 06/17/2014  . Thoracic spine tumor 06/17/2014  . Metastatic bone cancer 06/15/2014    Percell Miller Ardath Sax, OTR/L 09/14/2014, 1:20 PM  [facility] [address] [place], [place], [address] Phone: [phone]   Fax:  [phone]

## 2014-09-14 NOTE — Therapy (Signed)
[facility] [address] [place], [place], [address] Phone: [phone]   Fax:  [phone]  Physical Therapy Treatment  Patient Details  Name: Jeremiah Owens MRN: [id number] Date of Birth: July 18, 1930 Referring Provider:  Wenda Low, MD  Encounter Date: 09/14/2014      PT End of Session - 09/14/14 1159    Visit Number 2   Number of Visits 17   Date for PT Re-Evaluation 11/03/14   Authorization Type Medicare Triad Primary, Tricare secondary G codes required   PT Start Time 0850   PT Stop Time 0929   PT Time Calculation (min) 39 min      Past Medical History  Diagnosis Date  . Compression fracture   . Melanoma   . Bone cancer     t_spine T-5  . Prostate cancer 2002  . Skin cancer 1994    melanoma  right neck   . Multiple myeloma   . Allergy   . Anxiety   . Thyroid disease     Past Surgical History  Procedure Laterality Date  . Hernia repair    . Posterior cervical fusion/foraminotomy N/A 06/17/2014    Procedure: Thoracic five laminectomy for epidural tumor resection Thoracic 4-7 posterior lateral arthrodesis, segmental pedicle screw fixation.;  Surgeon: Ashok Pall, MD;  Location: [facility];  Service: Neurosurgery;  Laterality: N/A;    There were no vitals taken for this visit.  Visit Diagnosis:  Difficulty walking  Lack of coordination  Weakness of left leg  Posture abnormality  Activity intolerance      Subjective Assessment - 09/14/14 0855    Symptoms Pt reports he has had some diarrhea but is feeling better now. He thinks it may be due to his homemade, natural laxitive.   Currently in Pain? No/denies       The patient was taught, performed, and was provided with a home exercise program to address balance training. See pt instructions for details. Frequent rest breaks required.                         PT Short Term Goals - 09/04/14 1227    PT SHORT TERM GOAL  #1   Title Pt will demonstrate independence with HEP Target: 10/06/14   PT SHORT TERM GOAL #2   Title Pt will increase BERG score to 36/56 for improving balance.  Target: 10/06/14   PT SHORT TERM GOAL #3   Title Pt will Decrease TUG time to 14.69 seconds for improving efficiency with functional mobility using a single point cane. Target 10/06/14   PT SHORT TERM GOAL #4   Title Pt will ambulate independently on indoor level surfaces with cane.  Target: 10/06/14   PT SHORT TERM GOAL #5   Title Pt will perform sit to stand without UE support, independently.   Target: 10/06/14           PT Long Term Goals - 09/04/14 1230    PT LONG TERM GOAL #1   Title Pt will increase Berg score to 49/56 for improved balance.  Target 11/03/14   PT LONG TERM GOAL #2   Title Pt will ambulate independently on indoor level surfaces without assistive device.  Target 11/03/14   PT LONG TERM GOAL #3   Title Pt will ambulate with suprervision on outdoor unlevel grass and concrete without assistive device. Target 11/03/14   PT LONG TERM GOAL #4   Title Pt will decrease TUG  time to <13.5 seconds without assistive device for decreased fall risk and increased independence. Target 11/03/14   PT LONG TERM GOAL #5   Title Pt will increase gait speed to >2.63 ft/sec for independent community ambulator status. Target 11/03/14   Additional Long Term Goals   Additional Long Term Goals Yes   PT LONG TERM GOAL #6   Title Increase FOTO ABC to 30% for improved balance conficdence.               Plan - 09/14/14 1200    Clinical Impression Statement Pt was provided with HEP today. He is expected to progress toward goals.   PT Next Visit Plan Review HEP. Add glut medius strengthening. Work on Dietitian.        Problem List Patient Active Problem List   Diagnosis Date Noted  . Gait disorder 09/05/2014  . Multiple myeloma   . Paraplegia 06/21/2014  . Postoperative anemia due to acute blood loss 06/21/2014  . Neoplasm of  thoracic spine 06/20/2014  . Spine metastasis 06/17/2014  . Thoracic spine tumor 06/17/2014  . Metastatic bone cancer 06/15/2014    Delrae Sawyers D 09/14/2014, 12:01 PM  [facility] [address] [place], [place], [address] Phone: [phone]   Fax:  [phone]

## 2014-09-14 NOTE — Patient Instructions (Signed)
   Braiding-aka line dance   Hold onto counter. Move to side: 1) cross right leg in front of left, 2) bring back leg out to side, then 3) cross right leg behind left, 4) bring left leg out to side. Continue sequence in same direction until you get to the end of your counter, then reverse sequence, moving in opposite direction. Do 2 laps along counter daily..   Copyright  VHI. All rights reserved.    Knee High   Holding counter, march with high knees, and 2 second holds with each march to the end of the counter. When you get to the end, march backwards to return to your starting point. Perform 2 laps daily.[email or web address].[email or web address]   Copyright  VHI. All rights reserved.    Turning in Place: Solid Surface   Hold onto counter and/or chair backs around you. Standing in place, lead with shoulders/hips and turn slowly making quarter turns toward left. Repeat for 3 full turns on right and left: for example. Turn right, turn left, turn right, turn left, turn right, turn left.  Copyright  VHI. All rights reserved.    Weight Shift: Anterior / Posterior (Limits of Stability)   Hold onto counter, and have a chair behind you in case you need to sit down. Slowly shift weight backward until toes begin to rise off floor. Return to starting position. Shift weight slowly forward until heels begin to rise off floor. Keep back straight and chest out--don't bend hips or knees. Hold each position 2 seconds. Repeat 20x. Perform daily.  Copyright  VHI. All rights reserved.    FUNCTIONAL MOBILITY: Toe Walking   Hold onto counter. Walk forward on toes. Do 2 laps along counter daily.   Copyright  VHI. All rights reserved.    FUNCTIONAL MOBILITY: Heel Walking   Hold onto counter. Walk forward on heels. Do 2 laps along your counter daily. .  Copyright  VHI. All rights reserved.

## 2014-09-17 ENCOUNTER — Other Ambulatory Visit: Payer: Self-pay | Admitting: Physical Medicine & Rehabilitation

## 2014-09-18 ENCOUNTER — Ambulatory Visit: Payer: Medicare Other | Admitting: Occupational Therapy

## 2014-09-18 ENCOUNTER — Ambulatory Visit: Payer: Medicare Other

## 2014-09-20 ENCOUNTER — Other Ambulatory Visit (HOSPITAL_BASED_OUTPATIENT_CLINIC_OR_DEPARTMENT_OTHER): Payer: Medicare Other

## 2014-09-20 ENCOUNTER — Other Ambulatory Visit: Payer: Self-pay | Admitting: Internal Medicine

## 2014-09-20 ENCOUNTER — Ambulatory Visit (HOSPITAL_BASED_OUTPATIENT_CLINIC_OR_DEPARTMENT_OTHER): Payer: Medicare Other

## 2014-09-20 DIAGNOSIS — Z5112 Encounter for antineoplastic immunotherapy: Secondary | ICD-10-CM | POA: Diagnosis not present

## 2014-09-20 DIAGNOSIS — C9002 Multiple myeloma in relapse: Secondary | ICD-10-CM

## 2014-09-20 DIAGNOSIS — C9000 Multiple myeloma not having achieved remission: Secondary | ICD-10-CM

## 2014-09-20 LAB — COMPREHENSIVE METABOLIC PANEL (CC13)
ALBUMIN: 3.4 g/dL — AB (ref 3.5–5.0)
ALK PHOS: 77 U/L (ref 40–150)
ALT: 14 U/L (ref 0–55)
AST: 20 U/L (ref 5–34)
Anion Gap: 5 mEq/L (ref 3–11)
BUN: 15.8 mg/dL (ref 7.0–26.0)
CO2: 29 mEq/L (ref 22–29)
Calcium: 9.2 mg/dL (ref 8.4–10.4)
Chloride: 104 mEq/L (ref 98–109)
Creatinine: 0.9 mg/dL (ref 0.7–1.3)
EGFR: 79 mL/min/{1.73_m2} — ABNORMAL LOW (ref >90)
Glucose: 93 mg/dl (ref 70–140)
Potassium: 4.4 mEq/L (ref 3.5–5.1)
SODIUM: 138 meq/L (ref 136–145)
Total Bilirubin: 0.48 mg/dL (ref 0.20–1.20)
Total Protein: 7.3 g/dL (ref 6.4–8.3)

## 2014-09-20 LAB — CBC WITH DIFFERENTIAL/PLATELET
BASO%: 0.2 % (ref 0.0–2.0)
Basophils Absolute: 0 10e3/uL (ref 0.0–0.1)
EOS%: 2 % (ref 0.0–7.0)
Eosinophils Absolute: 0.1 10e3/uL (ref 0.0–0.5)
HCT: 36.2 % — ABNORMAL LOW (ref 38.4–49.9)
HGB: 12.1 g/dL — ABNORMAL LOW (ref 13.0–17.1)
LYMPH%: 8.1 % — ABNORMAL LOW (ref 14.0–49.0)
MCH: 32.4 pg (ref 27.2–33.4)
MCHC: 33.4 g/dL (ref 32.0–36.0)
MCV: 97.1 fL (ref 79.3–98.0)
MONO#: 0.2 10e3/uL (ref 0.1–0.9)
MONO%: 5.9 % (ref 0.0–14.0)
NEUT#: 3.4 10e3/uL (ref 1.5–6.5)
NEUT%: 83.8 % — ABNORMAL HIGH (ref 39.0–75.0)
Platelets: 130 10e3/uL — ABNORMAL LOW (ref 140–400)
RBC: 3.73 10e6/uL — ABNORMAL LOW (ref 4.20–5.82)
RDW: 13.9 % (ref 11.0–14.6)
WBC: 4.1 10e3/uL (ref 4.0–10.3)
lymph#: 0.3 10e3/uL — ABNORMAL LOW (ref 0.9–3.3)

## 2014-09-20 LAB — LACTATE DEHYDROGENASE (CC13): LDH: 215 U/L (ref 125–245)

## 2014-09-20 MED ORDER — BORTEZOMIB CHEMO SQ INJECTION 3.5 MG (2.5MG/ML)
1.3000 mg/m2 | Freq: Once | INTRAMUSCULAR | Status: AC
  Administered 2014-09-20: 2.5 mg via SUBCUTANEOUS
  Filled 2014-09-20: qty 2.5

## 2014-09-20 MED ORDER — ONDANSETRON HCL 8 MG PO TABS
8.0000 mg | ORAL_TABLET | Freq: Once | ORAL | Status: AC
  Administered 2014-09-20: 8 mg via ORAL

## 2014-09-20 MED ORDER — ONDANSETRON HCL 8 MG PO TABS
ORAL_TABLET | ORAL | Status: AC
  Filled 2014-09-20: qty 1

## 2014-09-20 NOTE — Patient Instructions (Signed)
[facility] Discharge Instructions for Patients Receiving Chemotherapy  Today you received the following chemotherapy agents Velcade   To help prevent nausea and vomiting after your treatment, we encourage you to take your nausea medication Compazine 10 mg every 6 hours as needed.   If you develop nausea and vomiting that is not controlled by your nausea medication, call the clinic.   BELOW ARE SYMPTOMS THAT SHOULD BE REPORTED IMMEDIATELY:  *FEVER GREATER THAN 100.5 F  *CHILLS WITH OR WITHOUT FEVER  NAUSEA AND VOMITING THAT IS NOT CONTROLLED WITH YOUR NAUSEA MEDICATION  *UNUSUAL SHORTNESS OF BREATH  *UNUSUAL BRUISING OR BLEEDING  TENDERNESS IN MOUTH AND THROAT WITH OR WITHOUT PRESENCE OF ULCERS  *URINARY PROBLEMS  *BOWEL PROBLEMS  UNUSUAL RASH Items with * indicate a potential emergency and should be followed up as soon as possible.  Feel free to call the clinic you have any questions or concerns. The clinic phone number is [phone].    

## 2014-09-21 ENCOUNTER — Ambulatory Visit: Payer: Medicare Other

## 2014-09-21 ENCOUNTER — Ambulatory Visit: Payer: Medicare Other | Admitting: Occupational Therapy

## 2014-09-21 ENCOUNTER — Encounter: Payer: Self-pay | Admitting: Occupational Therapy

## 2014-09-21 DIAGNOSIS — D492 Neoplasm of unspecified behavior of bone, soft tissue, and skin: Secondary | ICD-10-CM | POA: Diagnosis not present

## 2014-09-21 DIAGNOSIS — IMO0001 Reserved for inherently not codable concepts without codable children: Secondary | ICD-10-CM

## 2014-09-21 DIAGNOSIS — C419 Malignant neoplasm of bone and articular cartilage, unspecified: Secondary | ICD-10-CM

## 2014-09-21 DIAGNOSIS — C9000 Multiple myeloma not having achieved remission: Secondary | ICD-10-CM | POA: Diagnosis not present

## 2014-09-21 DIAGNOSIS — R262 Difficulty in walking, not elsewhere classified: Secondary | ICD-10-CM

## 2014-09-21 DIAGNOSIS — Z7409 Other reduced mobility: Secondary | ICD-10-CM | POA: Diagnosis not present

## 2014-09-21 DIAGNOSIS — R6889 Other general symptoms and signs: Secondary | ICD-10-CM

## 2014-09-21 DIAGNOSIS — M2560 Stiffness of unspecified joint, not elsewhere classified: Secondary | ICD-10-CM

## 2014-09-21 DIAGNOSIS — C7951 Secondary malignant neoplasm of bone: Secondary | ICD-10-CM | POA: Diagnosis not present

## 2014-09-21 DIAGNOSIS — R29898 Other symptoms and signs involving the musculoskeletal system: Secondary | ICD-10-CM

## 2014-09-21 DIAGNOSIS — R279 Unspecified lack of coordination: Secondary | ICD-10-CM

## 2014-09-21 DIAGNOSIS — IMO0002 Reserved for concepts with insufficient information to code with codable children: Secondary | ICD-10-CM

## 2014-09-21 DIAGNOSIS — R293 Abnormal posture: Secondary | ICD-10-CM

## 2014-09-21 NOTE — Therapy (Signed)
[facility] [address] [place], [place], [address] Phone: [phone]   Fax:  [phone]  Physical Therapy Treatment  Patient Details  Name: Jeremiah Owens MRN: [id number] Date of Birth: 1929-10-17 Referring Provider:  Wenda Low, MD  Encounter Date: 09/21/2014      PT End of Session - 09/21/14 0945    Visit Number 3   Number of Visits 17   Date for PT Re-Evaluation 11/03/14   Authorization Type Medicare Triad Primary, Tricare secondary G codes required   PT Start Time 0847   PT Stop Time 0930   PT Time Calculation (min) 43 min   Activity Tolerance Patient tolerated treatment well   Behavior During Therapy [facility] for tasks assessed/performed      Past Medical History  Diagnosis Date  . Compression fracture   . Melanoma   . Bone cancer     t_spine T-5  . Prostate cancer 2002  . Skin cancer 1994    melanoma  right neck   . Multiple myeloma   . Allergy   . Anxiety   . Thyroid disease     Past Surgical History  Procedure Laterality Date  . Hernia repair    . Posterior cervical fusion/foraminotomy N/A 06/17/2014    Procedure: Thoracic five laminectomy for epidural tumor resection Thoracic 4-7 posterior lateral arthrodesis, segmental pedicle screw fixation.;  Surgeon: Ashok Pall, MD;  Location: [facility];  Service: Neurosurgery;  Laterality: N/A;    There were no vitals taken for this visit.  Visit Diagnosis:  Activity intolerance  Difficulty walking  Lack of coordination  Weakness of left leg  Posture abnormality      Subjective Assessment - 09/21/14 0854    Symptoms Pt reports he has been having a painful arc in both shoulders just in the past couple of weeks. His occupational therapist reports he is able to move BUE in flexion and abduction in supine without pain. but in sitting and standing he has pain.   Currently in Pain? Yes   Pain Score 8    Pain Location Shoulder   Pain  Orientation Right   Pain Onset 1 to 4 weeks ago       Session focused on review of HEP, standing dynamic balance, and gluteus medius strengthening. Patient demonstrated HEP from previous session with safety and proper technique. Dynamic balance and glute med strengthening activities performed at counter with varying levels of UE support to facilitate increased balance challenge: SLS with alternating UE reaching, tactile cues at standing leg glute med to facilitate activation; side stepping + hip internal rotation to target glute med; standing hip abd with emphasis on proper form; patient requires close supervision-min guard overall with balance challenges. Patient requires seated rest breaks between exercises.                       PT Short Term Goals - 09/04/14 1227    PT SHORT TERM GOAL #1   Title Pt will demonstrate independence with HEP Target: 10/06/14   PT SHORT TERM GOAL #2   Title Pt will increase BERG score to 36/56 for improving balance.  Target: 10/06/14   PT SHORT TERM GOAL #3   Title Pt will Decrease TUG time to 14.69 seconds for improving efficiency with functional mobility using a single point cane. Target 10/06/14   PT SHORT TERM GOAL #4   Title Pt will ambulate independently on indoor level surfaces with cane.  Target:  10/06/14   PT SHORT TERM GOAL #5   Title Pt will perform sit to stand without UE support, independently.   Target: 10/06/14           PT Long Term Goals - 09/04/14 1230    PT LONG TERM GOAL #1   Title Pt will increase Berg score to 49/56 for improved balance.  Target 11/03/14   PT LONG TERM GOAL #2   Title Pt will ambulate independently on indoor level surfaces without assistive device.  Target 11/03/14   PT LONG TERM GOAL #3   Title Pt will ambulate with suprervision on outdoor unlevel grass and concrete without assistive device. Target 11/03/14   PT LONG TERM GOAL #4   Title Pt will decrease TUG time to <13.5 seconds without assistive device for  decreased fall risk and increased independence. Target 11/03/14   PT LONG TERM GOAL #5   Title Pt will increase gait speed to >2.63 ft/sec for independent community ambulator status. Target 11/03/14   Additional Long Term Goals   Additional Long Term Goals Yes   PT LONG TERM GOAL #6   Title Increase FOTO ABC to 30% for improved balance conficdence.               Plan - 09/21/14 0948    Clinical Impression Statement Reviewed HEP today and patient performing safely and regularly. Expected to meet STGs.   PT Next Visit Plan Continue glut medius strengthening, add mat exercises, try quadruped? Posture training        Problem List Patient Active Problem List   Diagnosis Date Noted  . Gait disorder 09/05/2014  . Multiple myeloma   . Paraplegia 06/21/2014  . Postoperative anemia due to acute blood loss 06/21/2014  . 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 Spine metastasis 06/17/2014  . Thoracic spine tumor 06/17/2014  . Metastatic bone cancer 06/15/2014     I was present in this session for orientation purposes, however the session was performed by another PT Shelton Silvas Ripa). Delrae Sawyers, PT,DPT,NCS 09/21/2014 10:15 AM Phone [phone] FAX [phone]  Delbert Harness, [place], DPT 09/21/14 10:15 AM       [facility] [address] [place], [place], [address] Phone: [phone]   Fax:  [phone]

## 2014-09-21 NOTE — Therapy (Signed)
[facility] [address] [place], [place], [address] Phone: [phone]   Fax:  [phone]  Occupational Therapy Treatment  Patient Details  Name: Jeremiah Owens MRN: [id number] Date of Birth: 1929-09-18 Referring Provider:  Wenda Low, MD  Encounter Date: 09/21/2014      OT End of Session - 09/21/14 0855    Visit Number 3  G3   Number of Visits 17   Date for OT Re-Evaluation 11/02/14   Authorization Type MCR - G CODE NEEDED   OT Start Time 0800   OT Stop Time 0845   OT Time Calculation (min) 45 min   Activity Tolerance Patient tolerated treatment well      Past Medical History  Diagnosis Date  . Compression fracture   . Melanoma   . Bone cancer     t_spine T-5  . Prostate cancer 2002  . Skin cancer 1994    melanoma  right neck   . Multiple myeloma   . Allergy   . Anxiety   . Thyroid disease     Past Surgical History  Procedure Laterality Date  . Hernia repair    . Posterior cervical fusion/foraminotomy N/A 06/17/2014    Procedure: Thoracic five laminectomy for epidural tumor resection Thoracic 4-7 posterior lateral arthrodesis, segmental pedicle screw fixation.;  Surgeon: Ashok Pall, MD;  Location: [facility];  Service: Neurosurgery;  Laterality: N/A;    There were no vitals taken for this visit.  Visit Diagnosis:  Activity intolerance  Decreased functional mobility and endurance  Stiffness of joint of upper extremity  Lack of coordination  Metastatic bone cancer      Subjective Assessment - 09/21/14 0805    Symptoms I have had a decline in Rt shoulder over the past 2 weeks. I had full ROM RUE (LUE was impaired before)   Pertinent History active CA, LUE shoulder impairments premorbidly approx. 75%    Repetition Increases Symptoms   Patient Stated Goals To strengthen my arms   Currently in Pain? Yes   Pain Score 8    Pain Location Shoulder   Pain Orientation Right   Pain  Descriptors / Indicators Aching;Jabbing   Pain Type Acute pain   Pain Onset 1 to 4 weeks ago   Pain Frequency Intermittent   Aggravating Factors  during painful arc with shoulder flexion and abduction (between 80* - 120*) higher w/ abduction, but full range and low range doesn't hurt   Pain Relieving Factors rest                 OT Treatments/Exercises (OP) - 09/21/14 0001    Exercises   Exercises Shoulder   Shoulder Exercises: ROM/Strengthening   Other ROM/Strengthening Exercises Due to new pain in Rt shoulder (in painful arc w/ sh. flex and abd.), Pt instructed to do cane ex's supine. Pt performed sh. flexion, sh. abduction, and chest press w/ no pain RUE. Pt also instructed in new cane exercise for ER/IR elbows bent to side in supine. Pt had no pain with this. (LUE w/ minimal pain, premorbid). Pt also instructed to work on scapula retraction/postural exercise throughout day holding each 10 sec. Pt instructed to stop biceps curls.                 OT Education - 09/21/14 0854    Education provided Yes   Education Details cane exercises previously issued but now instructed to do supine d/t new Rt shoulder pain. Added ER/IR supine with  cane   Person(s) Educated Patient   Methods Explanation;Demonstration   Comprehension Verbalized understanding;Returned demonstration          OT Short Term Goals - 09/04/14 1118    OT SHORT TERM GOAL #1   Title Independent w/ initial HEP for Lt shoulder (due 10/03/14)   Time 4   Period Weeks   Status New   OT SHORT TERM GOAL #2   Title Pt to perform 10 min . of UE exercise w/o rest    Time 4   Period Weeks   Status New   OT SHORT TERM GOAL #3   Title Pt to perform cooking task w/ supervision only using walker w/o LOB   Baseline requires assist    Time 4   Period Weeks   Status New           OT Long Term Goals - 09/04/14 1121    OT LONG TERM GOAL #1   Title Independent w/ updated HEP prn (due 11/02/14)   Time 8  but  may only need 6 weeks   Period Weeks   Status New   OT LONG TERM GOAL #2   Title Pt to perform dynamic standing tasks for 10 minutes or greater w/o LOB   Time 8   Period Weeks   Status New   OT LONG TERM GOAL #3   Title Pt/family to verbally id strategies for safety during IADLS    Time 8   Period Weeks   Status New               Plan - 09/21/14 4403    Clinical Impression Statement Pt with new Rt shoulder pain and weakness b/t 80-120* sh. flexion and abduction within the last 2 weeks. Adapted cane ex's today to perform supine w/ no pain.    Plan cane ex's supine, scapula retraction/postural ex's, possibly UBE if pt can tolerate w/o increase in pain        Problem List Patient Active Problem List   Diagnosis Date Noted  . Gait disorder 09/05/2014  . Multiple myeloma   . Paraplegia 06/21/2014  . Postoperative anemia due to acute blood loss 06/21/2014  . 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 Spine metastasis 06/17/2014  . Thoracic spine tumor 06/17/2014  . Metastatic bone cancer 06/15/2014    Carey Bullocks, OTR/L 09/21/2014, 8:58 AM  [facility] [address] [place], [place], [address] Phone: [phone]   Fax:  [phone]

## 2014-09-22 ENCOUNTER — Ambulatory Visit: Payer: Medicare Other

## 2014-09-22 ENCOUNTER — Ambulatory Visit: Payer: Medicare Other | Admitting: Occupational Therapy

## 2014-09-22 ENCOUNTER — Encounter: Payer: Self-pay | Admitting: Occupational Therapy

## 2014-09-22 DIAGNOSIS — M2560 Stiffness of unspecified joint, not elsewhere classified: Secondary | ICD-10-CM | POA: Diagnosis not present

## 2014-09-22 DIAGNOSIS — D492 Neoplasm of unspecified behavior of bone, soft tissue, and skin: Secondary | ICD-10-CM | POA: Diagnosis not present

## 2014-09-22 DIAGNOSIS — M25511 Pain in right shoulder: Secondary | ICD-10-CM

## 2014-09-22 DIAGNOSIS — Z7409 Other reduced mobility: Secondary | ICD-10-CM | POA: Diagnosis not present

## 2014-09-22 DIAGNOSIS — R279 Unspecified lack of coordination: Secondary | ICD-10-CM

## 2014-09-22 DIAGNOSIS — R29898 Other symptoms and signs involving the musculoskeletal system: Secondary | ICD-10-CM

## 2014-09-22 DIAGNOSIS — R6889 Other general symptoms and signs: Secondary | ICD-10-CM | POA: Diagnosis not present

## 2014-09-22 DIAGNOSIS — IMO0002 Reserved for concepts with insufficient information to code with codable children: Secondary | ICD-10-CM

## 2014-09-22 DIAGNOSIS — C9000 Multiple myeloma not having achieved remission: Secondary | ICD-10-CM | POA: Diagnosis not present

## 2014-09-22 DIAGNOSIS — C7951 Secondary malignant neoplasm of bone: Secondary | ICD-10-CM | POA: Diagnosis not present

## 2014-09-22 DIAGNOSIS — R293 Abnormal posture: Secondary | ICD-10-CM

## 2014-09-22 LAB — KAPPA/LAMBDA LIGHT CHAINS
KAPPA FREE LGHT CHN: 22.6 mg/dL — AB (ref 0.33–1.94)
Kappa:Lambda Ratio: 34.24 — ABNORMAL HIGH (ref 0.26–1.65)
Lambda Free Lght Chn: 0.66 mg/dL (ref 0.57–2.63)

## 2014-09-22 LAB — BETA 2 MICROGLOBULIN, SERUM: Beta-2 Microglobulin: 4.02 mg/L — ABNORMAL HIGH (ref <=2.51)

## 2014-09-22 LAB — IGG, IGA, IGM
IgA: 30 mg/dL — ABNORMAL LOW (ref 68–379)
IgG (Immunoglobin G), Serum: 2090 mg/dL — ABNORMAL HIGH (ref 650–1600)
IgM, Serum: 27 mg/dL — ABNORMAL LOW (ref 41–251)

## 2014-09-22 NOTE — Progress Notes (Signed)
  Radiation Oncology         [phone] ________________________________  Name: Jeremiah Owens MRN: [id number]  Date: 09/04/2014  DOB: 03/12/1930  End of Treatment Note  Diagnosis:   Multiple myeloma     Indication for treatment:  palliaitve       Radiation treatment dates:   08/21/14 - 09/04/14  Site/dose:   The patient was treated to the T4-6 levels of the spine. He received 25 Gy in 10 fractions using APPA fields.  Narrative: The patient tolerated radiation treatment relatively well.   He did not have significant difficulty with toxicity during treatment.  Plan: The patient has completed radiation treatment. The patient will return to radiation oncology clinic for routine followup in one month. I advised the patient to call or return sooner if they have any questions or concerns related to their recovery or treatment. ________________________________  Jodelle Gross, M.D., Ph.D.

## 2014-09-22 NOTE — Therapy (Signed)
[facility] [address] [place], [place], [address] Phone: [phone]   Fax:  [phone]  Physical Therapy Treatment  Patient Details  Name: Jeremiah Owens MRN: [id number] Date of Birth: 1929-09-16 Referring Provider:  Wenda Low, MD  Encounter Date: 09/22/2014      PT End of Session - 09/21/14 0945    Visit Number 3   Number of Visits 17   Date for PT Re-Evaluation 11/03/14   Authorization Type Medicare [employer] Primary, [employer] secondary G codes required   PT Start Time 0847   PT Stop Time 0930   PT Time Calculation (min) 43 min   Activity Tolerance Patient tolerated treatment well   Behavior During Therapy [facility] for tasks assessed/performed      Past Medical History  Diagnosis Date  . Compression fracture   . Melanoma   . Bone cancer     t_spine T-5  . Prostate cancer 2002  . Skin cancer 1994    melanoma  right neck   . Multiple myeloma   . Allergy   . Anxiety   . Thyroid disease     Past Surgical History  Procedure Laterality Date  . Hernia repair    . Posterior cervical fusion/foraminotomy N/A 06/17/2014    Procedure: Thoracic five laminectomy for epidural tumor resection Thoracic 4-7 posterior lateral arthrodesis, segmental pedicle screw fixation.;  Surgeon: Ashok Pall, MD;  Location: [facility];  Service: Neurosurgery;  Laterality: N/A;    There were no vitals taken for this visit.  Visit Diagnosis:  No diagnosis found.      Subjective Assessment - 09/22/14 1113    Symptoms Pt reports he is feeling good and not sore from yesterday's exercise.   Currently in Pain? No/denies     Single leg bridges each leg 2x10 2x10 bridge holds with 10 marches bilateral 2x10 high arc straight leg raises/lowers on/off mat; performed on each leg 2x10 clamshells each leg with MOD A for form to isolate glut medius; performed on each leg Anterior/posterior pelvic tilts in sitting with MIN A to  facilitate 2x10  Neuro re-ed x10 minutes 3 laps lateral walking at counter with bilateral intoeing with BUE support Anterior/posterior limits of stability on rockerboard with BUE Support and supervision, then single UE support and MIN A, then statick standing on rockerboard with head turns and MIN A to steady without UE support                          PT Short Term Goals - 09/04/14 1227    PT SHORT TERM GOAL #1   Title Pt will demonstrate independence with HEP Target: 10/06/14   PT SHORT TERM GOAL #2   Title Pt will increase BERG score to 36/56 for improving balance.  Target: 10/06/14   PT SHORT TERM GOAL #3   Title Pt will Decrease TUG time to 14.69 seconds for improving efficiency with functional mobility using a single point cane. Target 10/06/14   PT SHORT TERM GOAL #4   Title Pt will ambulate independently on indoor level surfaces with cane.  Target: 10/06/14   PT SHORT TERM GOAL #5   Title Pt will perform sit to stand without UE support, independently.   Target: 10/06/14           PT Long Term Goals - 09/04/14 1230    PT LONG TERM GOAL #1   Title Pt will increase Berg score to 49/56 for  improved balance.  Target 11/03/14   PT LONG TERM GOAL #2   Title Pt will ambulate independently on indoor level surfaces without assistive device.  Target 11/03/14   PT LONG TERM GOAL #3   Title Pt will ambulate with suprervision on outdoor unlevel grass and concrete without assistive device. Target 11/03/14   PT LONG TERM GOAL #4   Title Pt will decrease TUG time to <13.5 seconds without assistive device for decreased fall risk and increased independence. Target 11/03/14   PT LONG TERM GOAL #5   Title Pt will increase gait speed to >2.63 ft/sec for independent community ambulator status. Target 11/03/14   Additional Long Term Goals   Additional Long Term Goals Yes   PT LONG TERM GOAL #6   Title Increase FOTO ABC to 30% for improved balance conficdence.               Plan -  09/21/14 0948    Clinical Impression Statement Reviewed HEP today and patient performing safely and regularly. Expected to meet STGs.   PT Next Visit Plan Continue glut medius strengthening, add mat exercises, try quadruped? Posture training        Problem List Patient Active Problem List   Diagnosis Date Noted  . Gait disorder 09/05/2014  . Multiple myeloma   . Paraplegia 06/21/2014  . Postoperative anemia due to acute blood loss 06/21/2014  . 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 Spine metastasis 06/17/2014  . Thoracic spine tumor 06/17/2014  . Metastatic bone cancer 06/15/2014   Delrae Sawyers, PT,DPT,NCS 09/22/2014 11:53 AM Phone [phone] FAX [phone]         [facility] [address] [place], [place], [address] Phone: [phone]   Fax:  [phone]

## 2014-09-22 NOTE — Therapy (Signed)
[facility] [address] [place], [place], [address] Phone: [phone]   Fax:  [phone]  Occupational Therapy Treatment  Patient Details  Name: Jeremiah Owens MRN: [id number] Date of Birth: 1929-12-16 Referring Provider:  Wenda Low, MD  Encounter Date: 09/22/2014      OT End of Session - 09/22/14 1301    Visit Number 4   Number of Visits 17   Date for OT Re-Evaluation 11/02/14   Authorization Type MCR - G CODE NEEDED   OT Start Time 1155   OT Stop Time 1238   OT Time Calculation (min) 43 min   Equipment Utilized During Treatment UBE, hemiglide, 2 lb weight   Activity Tolerance Patient tolerated treatment well   Behavior During Therapy [facility] for tasks assessed/performed      Past Medical History  Diagnosis Date  . Compression fracture   . Melanoma   . Bone cancer     t_spine T-5  . Prostate cancer 2002  . Skin cancer 1994    melanoma  right neck   . Multiple myeloma   . Allergy   . Anxiety   . Thyroid disease     Past Surgical History  Procedure Laterality Date  . Hernia repair    . Posterior cervical fusion/foraminotomy N/A 06/17/2014    Procedure: Thoracic five laminectomy for epidural tumor resection Thoracic 4-7 posterior lateral arthrodesis, segmental pedicle screw fixation.;  Surgeon: Ashok Pall, MD;  Location: [facility];  Service: Neurosurgery;  Laterality: N/A;    There were no vitals taken for this visit.  Visit Diagnosis:  Pain in joint, shoulder region, right  Stiffness of joint of upper extremity  Lack of coordination  Activity intolerance      Subjective Assessment - 09/22/14 1252    Symptoms That right shoulder is still hurting.  I couldn't get my wrench out of the cabinet in the garage.   Pertinent History active CA, LUE shoulder impairments premorbidly approx. 75%    Patient Stated Goals relieve pain and improve reach in right arm   Currently in Pain? No/denies   at rest   Pain Score 6    Pain Location Shoulder   Pain Orientation Right   Pain Descriptors / Indicators Aching   Pain Type Acute pain   Pain Onset 1 to 4 weeks ago   Pain Frequency Intermittent   Aggravating Factors  mid range shoulder flexxion when upright   Pain Relieving Factors rest, reposition, alignment   Multiple Pain Sites No                 OT Treatments/Exercises (OP) - 09/22/14 0001    Shoulder Exercises: Supine   Protraction Weight (lbs) 2 lb   Flexion AROM;Strengthening;Right;15 reps   Shoulder Flexion Weight (lbs) 2 lb  shoulder flexion with elbow extension   Other Supine Exercises Reach patterns with scapular depression, encouraging timing breathing with arm movement   Shoulder Exercises: Seated   Other Seated Exercises UBE Level 2; 2 min forward, and 1 min backward without discomfort   Other Seated Exercises Tricep press in sitting - chair push ups X 10   Shoulder Exercises: Standing   Flexion AAROM;Right;10 reps   Flexion Limitations ~90-115 degrees is painful unless patient activates active trunk extension, scapular depression, and coordinates breathing   Other Standing Exercises Active shoulder adduction, abduction through full range using pole for UE support   Functional Reaching Activities   Mid Level guided reaching from waists to  shoulder height                OT Education - 09/22/14 1258    Education provided Yes   Education Details importance of erect posture, and shoulder depression (versus elevation) with mid level reaching activities.  Encouraged patient to support weight of right arm, with left hand as needed to decrease  pain with reach   Person(s) Educated Patient   Methods Explanation;Demonstration;Tactile cues;Verbal cues   Comprehension Verbalized understanding;Returned demonstration;Verbal cues required;Tactile cues required          OT Short Term Goals - 09/22/14 1305    OT SHORT TERM GOAL #1   Title Independent w/  initial HEP for Lt shoulder (due 10/03/14)   Status On-going   OT SHORT TERM GOAL #2   Title Pt to perform 10 min . of UE exercise w/o rest    Status On-going   OT SHORT TERM GOAL #3   Title Pt to perform cooking task w/ supervision only using walker w/o LOB   Status On-going           OT Long Term Goals - 09/22/14 1306    OT LONG TERM GOAL #1   Title Independent w/ updated HEP prn (due 11/02/14)   Status On-going   OT LONG TERM GOAL #2   Title Pt to perform dynamic standing tasks for 10 minutes or greater w/o LOB   Status On-going   OT LONG TERM GOAL #3   Title Pt/family to verbally id strategies for safety during IADLS    Status On-going               Plan - 09/22/14 1301    Clinical Impression Statement Patient with right shoulder pain / muscle imbalance in shoulder and trunk which impedes reaching at shoulder height and above.  Patient is benefitting from OT to improve strength, activity tolerance, and functional reach in right UE.   Pt will benefit from skilled therapeutic intervention in order to improve on the following deficits (Retired) Decreased activity tolerance;Decreased balance;Decreased endurance;Decreased range of motion;Decreased strength;Impaired UE functional use;Improper body mechanics;Impaired tone   Rehab Potential Good   OT Frequency 2x / week   OT Duration 8 weeks   OT Treatment/Interventions Self-care/ADL training;Therapeutic exercise;Moist Heat;Neuromuscular education;Functional Mobility Training;Energy conservation;Patient/family education;DME and/or AE instruction;Passive range of motion;Therapeutic activities   Plan Supine strengthening right upper extremity, followed with upright reach patterns with empasis on improving trunk extension (thoracic) and reducing scapular elevation.  Needs to build proximal strength   Consulted and Agree with Plan of Care Patient        Problem List Patient Active Problem List   Diagnosis Date Noted  . Gait  disorder 09/05/2014  . Multiple myeloma   . Paraplegia 06/21/2014  . Postoperative anemia due to acute blood loss 06/21/2014  . 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 Spine metastasis 06/17/2014  . Thoracic spine tumor 06/17/2014  . Metastatic bone cancer 06/15/2014   Antony Salmon, OTR/L 09/22/2014 1:09 PM Phone: [phone] Fax: [phone]   [facility] [address] [place], [place], [address] Phone: [phone]   Fax:  [phone]

## 2014-09-25 ENCOUNTER — Ambulatory Visit: Payer: Medicare Other | Admitting: Rehabilitative and Restorative Service Providers"

## 2014-09-25 ENCOUNTER — Ambulatory Visit: Payer: Medicare Other | Admitting: Occupational Therapy

## 2014-09-25 ENCOUNTER — Telehealth: Payer: Self-pay | Admitting: Internal Medicine

## 2014-09-25 DIAGNOSIS — R262 Difficulty in walking, not elsewhere classified: Secondary | ICD-10-CM

## 2014-09-25 DIAGNOSIS — D492 Neoplasm of unspecified behavior of bone, soft tissue, and skin: Secondary | ICD-10-CM | POA: Diagnosis not present

## 2014-09-25 DIAGNOSIS — C7951 Secondary malignant neoplasm of bone: Secondary | ICD-10-CM | POA: Diagnosis not present

## 2014-09-25 DIAGNOSIS — M25511 Pain in right shoulder: Secondary | ICD-10-CM

## 2014-09-25 DIAGNOSIS — C9000 Multiple myeloma not having achieved remission: Secondary | ICD-10-CM | POA: Diagnosis not present

## 2014-09-25 DIAGNOSIS — R293 Abnormal posture: Secondary | ICD-10-CM

## 2014-09-25 DIAGNOSIS — M2560 Stiffness of unspecified joint, not elsewhere classified: Secondary | ICD-10-CM | POA: Diagnosis not present

## 2014-09-25 DIAGNOSIS — R6889 Other general symptoms and signs: Secondary | ICD-10-CM | POA: Diagnosis not present

## 2014-09-25 DIAGNOSIS — Z7409 Other reduced mobility: Secondary | ICD-10-CM | POA: Diagnosis not present

## 2014-09-25 NOTE — Therapy (Signed)
[facility] [address] [place], [place], [address] Phone: [phone]   Fax:  [phone]  Occupational Therapy Treatment  Patient Details  Name: Jeremiah Owens MRN: [id number] Date of Birth: 12-07-1929 Referring Provider:  Wenda Low, MD  Encounter Date: 09/25/2014      OT End of Session - 09/25/14 1016    Visit Number 5  G5   Number of Visits 17   Date for OT Re-Evaluation 11/02/14   Authorization Type MCR - G CODE NEEDED   OT Start Time 0930   OT Stop Time 1015   OT Time Calculation (min) 45 min   Activity Tolerance Patient tolerated treatment well      Past Medical History  Diagnosis Date  . Compression fracture   . Melanoma   . Bone cancer     t_spine T-5  . Prostate cancer 2002  . Skin cancer 1994    melanoma  right neck   . Multiple myeloma   . Allergy   . Anxiety   . Thyroid disease     Past Surgical History  Procedure Laterality Date  . Hernia repair    . Posterior cervical fusion/foraminotomy N/A 06/17/2014    Procedure: Thoracic five laminectomy for epidural tumor resection Thoracic 4-7 posterior lateral arthrodesis, segmental pedicle screw fixation.;  Surgeon: Ashok Pall, MD;  Location: [facility];  Service: Neurosurgery;  Laterality: N/A;    There were no vitals taken for this visit.  Visit Diagnosis:  Pain in joint, shoulder region, right  Posture abnormality      Subjective Assessment - 09/25/14 0930    Pertinent History (p) active CA, LUE shoulder impairments premorbidly approx. 75%    Repetition (p) Increases Symptoms   Patient Stated Goals (p) relieve pain and improve reach in right arm   Currently in Pain? (p) Yes   Pain Score (p) 2    Pain Location (p) Shoulder   Pain Orientation (p) Right   Pain Descriptors / Indicators (p) Aching   Pain Type (p) Acute pain   Pain Onset (p) 1 to 4 weeks ago   Pain Frequency (p) Intermittent   Aggravating Factors  (p) Mid to  high range shoulder flexion when upright                 OT Treatments/Exercises (OP) - 09/25/14 1009    Shoulder Exercises: Supine   Other Supine Exercises Reviewed all cane ex's supine. Pt needed clarification for ER/IR w/ cane.    Shoulder Exercises: ROM/Strengthening   Pushups Limitations chair push ups x 10 reps for triceps and downward depression of bilateral scapulas   Other ROM/Strengthening Exercises UBE X 19mn. with towel roll at back to encourage trunk extension   Neurological Re-education Exercises   Scapular Stabilization Seated  with manual techniques for proper alignment w/ sh. flexion   Other Exercises 1 AA/ROM RUE (seated) with focus on proper body alignment encouraging anterior pelvic tilt with trunk extension, scapula depression to upward rotation with sh. flexion (sliding along diagonal surface, then with cane vertically)   Other Exercises 2 actively working on A-P pelvic tilts with trunk extension seated                   OT Short Term Goals - 09/22/14 1305    OT SHORT TERM GOAL #1   Title Independent w/ initial HEP for Lt shoulder (due 10/03/14)   Status On-going   OT SHORT TERM GOAL #2  Title Pt to perform 10 min . of UE exercise w/o rest    Status On-going   OT SHORT TERM GOAL #3   Title Pt to perform cooking task w/ supervision only using walker w/o LOB   Status On-going           OT Long Term Goals - 09/22/14 1306    OT LONG TERM GOAL #1   Title Independent w/ updated HEP prn (due 11/02/14)   Status On-going   OT LONG TERM GOAL #2   Title Pt to perform dynamic standing tasks for 10 minutes or greater w/o LOB   Status On-going   OT LONG TERM GOAL #3   Title Pt/family to verbally id strategies for safety during IADLS    Status On-going               Plan - 09/25/14 1017    Clinical Impression Statement Pt with no pain RUE seated for AA/ROM ex's using proper positioning and alignment at trunk and scapula with focus on  posture and breathing.    Plan towel stretch supine, UE ranger        Problem List Patient Active Problem List   Diagnosis Date Noted  . Gait disorder 09/05/2014  . Multiple myeloma   . Paraplegia 06/21/2014  . Postoperative anemia due to acute blood loss 06/21/2014  . 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 Spine metastasis 06/17/2014  . Thoracic spine tumor 06/17/2014  . Metastatic bone cancer 06/15/2014    Carey Bullocks, OTR/L 09/25/2014, 10:18 AM  [facility] [address] [place], [place], [address] Phone: [phone]   Fax:  [phone]

## 2014-09-25 NOTE — Therapy (Signed)
[facility] [address] [place], [place], [address] Phone: [phone]   Fax:  [phone]  Physical Therapy Treatment  Patient Details  Name: Jeremiah Owens MRN: [id number] Date of Birth: [birth year] Referring Provider:  Wenda Low, MD  Encounter Date: 09/25/2014      PT End of Session - 09/25/14 1403    Visit Number 5   Number of Visits 17   Date for PT Re-Evaluation 11/03/14   Authorization Type Medicare Triad Primary, T[employer]are secondary G codes required   PT Start Time 0847   PT Stop Time 0932   PT Time Calculation (min) 45 min   Equipment Utilized During Treatment Gait belt   Activity Tolerance Patient tolerated treatment well   Behavior During Therapy [facility] for tasks assessed/performed      Past Medical History  Diagnosis Date  . Compression fracture   . Melanoma   . Bone cancer     t_spine T-5  . Prostate cancer 2002  . Skin cancer 1994    melanoma  right neck   . Multiple myeloma   . Allergy   . Anxiety   . Thyroid disease     Past Surgical History  Procedure Laterality Date  . Hernia repair    . Posterior cervical fusion/foraminotomy N/A 06/17/2014    Procedure: Thoracic five laminectomy for epidural tumor resection Thoracic 4-7 posterior lateral arthrodesis, segmental pedicle screw fixation.;  Surgeon: Ashok Pall, MD;  Location: [facility];  Service: Neurosurgery;  Laterality: N/A;    There were no vitals taken for this visit.  Visit Diagnosis:  Difficulty walking  Posture abnormality      Subjective Assessment - 09/25/14 0850    Symptoms Patient was busy over the weekend and did not have as much time for his exercise.  His R shoulder is painful today.   Currently in Pain? Yes  OT to address R shoulder pain     THERAPEUTIC EXERCISE: Bridges x 10 with towel roll perpendicular to spine at waist to encourage pelvic anterior tilt for posture Single knee to chest R and L x 10  seconds Sit<>stand emphasizing anterior pelvic tilt to initiate movement  NEUROMUSCULAR RE-EDUCATION: Standing lateral weight shift with mini marches and CGA Standing balance activities with 1/2 tandem stance working on static balance and posture with min A Standing alternating foot taps to 6" surface working on core stabilization and hip stabilization for balance Standing activities moving foot from 6" to 12" surface Weight shifting/rocking anterior/posterior  Gait: Ambulation x 120 ft with SPC and HHA RW x 120 ft x 2 reps with postural cues Ambulation in parallel bars anteriorly and posteriorly without UE support with CGA for safety encouraging longer stride length          PT Short Term Goals - 09/04/14 1227    PT SHORT TERM GOAL #1   Title Pt will demonstrate independence with HEP Target: 10/06/14   PT SHORT TERM GOAL #2   Title Pt will increase BERG score to 36/56 for improving balance.  Target: 10/06/14   PT SHORT TERM GOAL #3   Title Pt will Decrease TUG time to 14.69 seconds for improving efficiency with functional mobility using a single point cane. Target 10/06/14   PT SHORT TERM GOAL #4   Title Pt will ambulate independently on indoor level surfaces with cane.  Target: 10/06/14   PT SHORT TERM GOAL #5   Title Pt will perform sit to stand without UE support, independently.  Target: 10/06/14           PT Long Term Goals - 09/04/14 1230    PT LONG TERM GOAL #1   Title Pt will increase Berg score to 49/56 for improved balance.  Target 11/03/14   PT LONG TERM GOAL #2   Title Pt will ambulate independently on indoor level surfaces without assistive device.  Target 11/03/14   PT LONG TERM GOAL #3   Title Pt will ambulate with suprervision on outdoor unlevel grass and concrete without assistive device. Target 11/03/14   PT LONG TERM GOAL #4   Title Pt will decrease TUG time to <13.5 seconds without assistive device for decreased fall risk and increased independence. Target 11/03/14    PT LONG TERM GOAL #5   Title Pt will increase gait speed to >2.63 ft/sec for independent community ambulator status. Target 11/03/14   Additional Long Term Goals   Additional Long Term Goals Yes   PT LONG TERM GOAL #6   Title Increase FOTO ABC to 30% for improved balance conficdence.               Plan - 09/25/14 1404    Clinical Impression Statement The patient tolerates standing balance activities well.  We began gait activities with [facility] and PT to progress to patient tolerance.     PT Next Visit Plan Continue glut medius strengthening, add clamshells and straight leg raises to HEP. Posture and balance training.        Problem List Patient Active Problem List   Diagnosis Date Noted  . Gait disorder 09/05/2014  . Multiple myeloma   . Paraplegia 06/21/2014  . Postoperative anemia due to acute blood loss 06/21/2014  . 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 Spine metastasis 06/17/2014  . Thoracic spine tumor 06/17/2014  . Metastatic bone cancer 06/15/2014    Tommie Dejoseph, PT 09/25/2014, 2:07 PM  [facility] [address] [place], [place], [address] Phone: [phone]   Fax:  [phone]

## 2014-09-25 NOTE — Telephone Encounter (Signed)
returned call and s.w. pt wife and confirmed appts.

## 2014-09-27 ENCOUNTER — Other Ambulatory Visit: Payer: Self-pay | Admitting: Medical Oncology

## 2014-09-27 ENCOUNTER — Inpatient Hospital Stay: Payer: Medicare Other | Admitting: Physical Medicine & Rehabilitation

## 2014-09-27 ENCOUNTER — Telehealth: Payer: Self-pay | Admitting: Physician Assistant

## 2014-09-27 ENCOUNTER — Other Ambulatory Visit (HOSPITAL_BASED_OUTPATIENT_CLINIC_OR_DEPARTMENT_OTHER): Payer: Medicare Other

## 2014-09-27 ENCOUNTER — Encounter: Payer: Self-pay | Admitting: Physician Assistant

## 2014-09-27 ENCOUNTER — Telehealth: Payer: Self-pay | Admitting: *Deleted

## 2014-09-27 ENCOUNTER — Ambulatory Visit (HOSPITAL_BASED_OUTPATIENT_CLINIC_OR_DEPARTMENT_OTHER): Payer: Medicare Other | Admitting: Physician Assistant

## 2014-09-27 ENCOUNTER — Ambulatory Visit: Payer: Medicare Other

## 2014-09-27 ENCOUNTER — Ambulatory Visit (HOSPITAL_BASED_OUTPATIENT_CLINIC_OR_DEPARTMENT_OTHER): Payer: Medicare Other

## 2014-09-27 VITALS — BP 145/59 | HR 94 | Temp 98.2°F | Resp 18 | Ht 69.0 in | Wt 169.1 lb

## 2014-09-27 DIAGNOSIS — C9000 Multiple myeloma not having achieved remission: Secondary | ICD-10-CM

## 2014-09-27 DIAGNOSIS — Z5112 Encounter for antineoplastic immunotherapy: Secondary | ICD-10-CM | POA: Diagnosis not present

## 2014-09-27 DIAGNOSIS — Z7901 Long term (current) use of anticoagulants: Secondary | ICD-10-CM | POA: Diagnosis not present

## 2014-09-27 DIAGNOSIS — C9002 Multiple myeloma in relapse: Secondary | ICD-10-CM

## 2014-09-27 LAB — CBC WITH DIFFERENTIAL/PLATELET
BASO%: 0 % (ref 0.0–2.0)
Basophils Absolute: 0 10*3/uL (ref 0.0–0.1)
EOS%: 0.2 % (ref 0.0–7.0)
Eosinophils Absolute: 0 10*3/uL (ref 0.0–0.5)
HCT: 36.6 % — ABNORMAL LOW (ref 38.4–49.9)
HGB: 12.3 g/dL — ABNORMAL LOW (ref 13.0–17.1)
LYMPH%: 4.5 % — AB (ref 14.0–49.0)
MCH: 32.5 pg (ref 27.2–33.4)
MCHC: 33.6 g/dL (ref 32.0–36.0)
MCV: 96.6 fL (ref 79.3–98.0)
MONO#: 0 10*3/uL — AB (ref 0.1–0.9)
MONO%: 0.7 % (ref 0.0–14.0)
NEUT#: 4.2 10*3/uL (ref 1.5–6.5)
NEUT%: 94.6 % — ABNORMAL HIGH (ref 39.0–75.0)
PLATELETS: 142 10*3/uL (ref 140–400)
RBC: 3.79 10*6/uL — AB (ref 4.20–5.82)
RDW: 14.1 % (ref 11.0–14.6)
WBC: 4.4 10*3/uL (ref 4.0–10.3)
lymph#: 0.2 10*3/uL — ABNORMAL LOW (ref 0.9–3.3)

## 2014-09-27 LAB — COMPREHENSIVE METABOLIC PANEL (CC13)
ALT: 17 U/L (ref 0–55)
ANION GAP: 8 meq/L (ref 3–11)
AST: 21 U/L (ref 5–34)
Albumin: 3.4 g/dL — ABNORMAL LOW (ref 3.5–5.0)
Alkaline Phosphatase: 84 U/L (ref 40–150)
BILIRUBIN TOTAL: 0.41 mg/dL (ref 0.20–1.20)
BUN: 16.6 mg/dL (ref 7.0–26.0)
CO2: 25 meq/L (ref 22–29)
CREATININE: 1 mg/dL (ref 0.7–1.3)
Calcium: 8.8 mg/dL (ref 8.4–10.4)
Chloride: 104 mEq/L (ref 98–109)
EGFR: 73 mL/min/{1.73_m2} — ABNORMAL LOW (ref >90)
GLUCOSE: 144 mg/dL — AB (ref 70–140)
POTASSIUM: 4.2 meq/L (ref 3.5–5.1)
SODIUM: 137 meq/L (ref 136–145)
Total Protein: 7.6 g/dL (ref 6.4–8.3)

## 2014-09-27 MED ORDER — LENALIDOMIDE 25 MG PO CAPS: 25.0000 mg | ORAL_CAPSULE | Freq: Every day | ORAL | Status: DC

## 2014-09-27 MED ORDER — ONDANSETRON HCL 8 MG PO TABS
8.0000 mg | ORAL_TABLET | Freq: Once | ORAL | Status: AC
  Administered 2014-09-27: 8 mg via ORAL

## 2014-09-27 MED ORDER — BORTEZOMIB CHEMO SQ INJECTION 3.5 MG (2.5MG/ML)
1.3000 mg/m2 | Freq: Once | INTRAMUSCULAR | Status: AC
  Administered 2014-09-27: 2.5 mg via SUBCUTANEOUS
  Filled 2014-09-27: qty 2.5

## 2014-09-27 MED ORDER — ONDANSETRON HCL 8 MG PO TABS
ORAL_TABLET | ORAL | Status: AC
  Filled 2014-09-27: qty 1

## 2014-09-27 MED ORDER — WARFARIN SODIUM 2 MG PO TABS: 2.0000 mg | ORAL_TABLET | Freq: Every day | ORAL | Status: DC

## 2014-09-27 NOTE — Progress Notes (Addendum)
[facility] [phone]   Fax:([phone]) [place] [employer], [address] [place]  [address]  DIAGNOSIS: Multiple myeloma diagnosed in November 2015  PRIOR THERAPY: Status post Thoracic five laminectomy for epidural tumor resection, Thoracic 4-7 posterior lateral arthrodesis,  segmental pedicle screw fixation T4-T7 Globus instrumentation under the care of Dr. Christella Noa on 06/18/2014.  CURRENT THERAPY: Systemic chemotherapy with Velcade 1.3 MG/M2 subcutaneously weekly in addition to Decadron 40 mg by mouth on weekly basis. Status post 10 cycles.  INTERVAL HISTORY: Jeremiah Owens 81 y.o. male returns to the clinic today for follow-up visit accompanied by his wife. The patient is currently on treatment with weekly subcutaneous Velcade and Decadron status post 10 cycles and tolerating the treatment fairly well. He does report some constipation that he is currently managing with a combination of prune juice and eating dried prunes as well. He does complain of some fatigue. He denied having any significant nausea or vomiting, no fever or chills. He denied having any significant weight loss or night sweats.   He continues to have some weakness in the left lower extremity and currently undergoing physical therapy.he will begin some neuro physical therapy on an outpatient basis soon. He denied having any significant chest pain, shortness breath, cough or hemoptysis. He is here today to start cycle #11 of his weekly systemic therapy. He also recently had repeat protein studies to reevaluate his disease and presents to discuss the results.  MEDICAL HISTORY: Past Medical History  Diagnosis Date  . Compression fracture   . Melanoma   . Bone cancer     t_spine T-5  . Prostate cancer 2002  . Skin cancer 1994    melanoma  right neck   . Multiple myeloma   . Allergy   . Anxiety   . Thyroid disease     ALLERGIES:  is  allergic to iodine.  MEDICATIONS:  Current Outpatient Prescriptions  Medication Sig Dispense Refill  . acyclovir (ZOVIRAX) 400 MG tablet Take 1 tablet (400 mg total) by mouth 2 (two) times daily. 60 tablet 2  . cholecalciferol (VITAMIN D) 1000 UNITS tablet Take 1,000 Units by mouth at bedtime.     Marland Kitchen dexamethasone (DECADRON) 4 MG tablet 10 tab every week, start with chemo 40 tablet 4  . hyaluronate sodium (RADIAPLEXRX) GEL Apply 1 application topically daily. Apply to treated area on skin after rad txs daily and prn    . HYDROcodone-acetaminophen (NORCO/VICODIN) 5-325 MG per tablet Take 1-2 tablets by mouth every 4 (four) hours as needed for moderate pain. 90 tablet 0  . HYDROCORTISONE, TOPICAL, 2.5 % SOLN Apply 1 application topically daily as needed.     Marland Kitchen levothyroxine (SYNTHROID, LEVOTHROID) 25 MCG tablet Take 1 tablet (25 mcg total) by mouth daily. 30 tablet 4  . methocarbamol (ROBAXIN) 500 MG tablet TAKE 1 TABLET (500 MG TOTAL) BY MOUTH EVERY 6 (SIX) HOURS AS NEEDED FOR MUSCLE SPASMS. 60 tablet 0  . Multiple Vitamins-Minerals (CENTRUM SILVER PO) Take 1 tablet by mouth daily.    Marland Kitchen omeprazole (PRILOSEC) 40 MG capsule Take 40 mg by mouth daily. In the afternoon  11  . Polyvinyl Alcohol-Povidone (REFRESH OP) Apply 1 drop to eye daily as needed (dry/irritated eyes).    . prochlorperazine (COMPAZINE) 10 MG tablet TAKE 1 TABLET BY MOUTH EVERY 6 HOURS AS NEEDED FOR NAUSEA AND VOMITING 30 tablet 0  . ranitidine (ZANTAC) 150 MG tablet Take 150  mg by mouth at bedtime.   11  . tamsulosin (FLOMAX) 0.4 MG CAPS capsule Take 1 capsule (0.4 mg total) by mouth at bedtime. 30 capsule 3  . lenalidomide (REVLIMID) 25 MG capsule Take 1 capsule (25 mg total) by mouth daily. Authorization number [id number] adult male 21 capsule 0  . warfarin (COUMADIN) 2 MG tablet Take 1 tablet (2 mg total) by mouth daily. 30 tablet 3   No current facility-administered medications for this visit.    SURGICAL HISTORY:    Past Surgical History  Procedure Laterality Date  . Hernia repair    . Posterior cervical fusion/foraminotomy N/A 06/17/2014    Procedure: Thoracic five laminectomy for epidural tumor resection Thoracic 4-7 posterior lateral arthrodesis, segmental pedicle screw fixation.;  Surgeon: Ashok Pall, MD;  Location: [facility];  Service: Neurosurgery;  Laterality: N/A;    REVIEW OF SYSTEMS:  Constitutional: positive for fatigue Eyes: negative Ears, nose, mouth, throat, and face: negative Respiratory: negative Cardiovascular: negative Gastrointestinal: negative Genitourinary:negative Integument/breast: negative Hematologic/lymphatic: negative Musculoskeletal:positive for muscle weakness Neurological: positive for weakness Behavioral/Psych: negative Endocrine: negative Allergic/Immunologic: negative   PHYSICAL EXAMINATION: General appearance: alert, cooperative, fatigued and no distress Head: Normocephalic, without obvious abnormality, atraumatic Neck: no adenopathy, no JVD, supple, symmetrical, trachea midline and thyroid not enlarged, symmetric, no tenderness/mass/nodules Lymph nodes: Cervical, supraclavicular, and axillary nodes normal. Resp: clear to auscultation bilaterally Back: symmetric, no curvature. ROM normal. No CVA tenderness. Cardio: regular rate and rhythm, S1, S2 normal, no murmur, click, rub or gallop GI: soft, non-tender; bowel sounds normal; no masses,  no organomegaly Extremities: extremities normal, atraumatic, no cyanosis or edema Neurologic: Motor: Weakness in the left foot  ECOG PERFORMANCE STATUS: 1 - Symptomatic but completely ambulatory  Blood pressure 145/59, pulse 94, temperature 98.2 F (36.8 C), temperature source Oral, resp. rate 18, height _0  (1.753 m), weight 169 lb 1.6 oz (76.703 kg), SpO2 99 %.  LABORATORY DATA: Lab Results  Component Value Date   WBC 4.4 09/27/2014   HGB 12.3* 09/27/2014   HCT 36.6* 09/27/2014   MCV 96.6 09/27/2014    PLT 142 09/27/2014      Chemistry      Component Value Date/Time   NA 137 09/27/2014 1343   NA 133* 07/04/2014 0430   K 4.2 09/27/2014 1343   K 4.1 07/04/2014 0430   CL 97 07/04/2014 0430   CO2 25 09/27/2014 1343   CO2 27 07/04/2014 0430   BUN 16.6 09/27/2014 1343   BUN 18 07/04/2014 0430   CREATININE 1.0 09/27/2014 1343   CREATININE 0.90 07/04/2014 0430      Component Value Date/Time   CALCIUM 8.8 09/27/2014 1343   CALCIUM 8.9 07/04/2014 0430   ALKPHOS 84 09/27/2014 1343   ALKPHOS 47 06/21/2014 0406   AST 21 09/27/2014 1343   AST 31 06/21/2014 0406   ALT 17 09/27/2014 1343   ALT 29 06/21/2014 0406   BILITOT 0.41 09/27/2014 1343   BILITOT <0.2* 06/21/2014 0406       RADIOGRAPHIC STUDIES: No results found.  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 He is currently on treatment with subcutaneous Velcade and Decadron status post 10 weekly doses. The patient is tolerating his treatment fairly well with no significant adverse effects. Patient was discussed with and also seen by Dr. Julien Nordmann. He will continue on his  current treatment with weekly Velcade and dexamethasone. He is recent protein studies showed evidence for disease progression. We will add Revlimid to his Velcade and dexamethasone. He will take Revlimid 25 mg by mouth daily for 3 weeks and off for one week. He will also be started on prophylactic dose Coumadin at 2 mg by mouth daily. You asked him to see his dentist to get dental clearance to be started on Zometa therapy. Side effects, risks and benefits of the adding Revlimid as well as Zometa were discussed with the patient and his wife. He will receive cycle #11 today.  He'll follow-up in 3 weeks for another symptom management visit. He was advised to call immediately if he has any concerning  symptoms in the interval. The patient voices understanding of current disease status and treatment options and is in agreement with the current care plan.  All questions were answered. The patient knows to call the clinic with any problems, questions or concerns. We can certainly see the patient much sooner if necessary.  Carlton Adam, PA-C 09/27/2014   ADDENDUM: Hematology/Oncology Attending: I had a face to face encounter with the patient. I recommended his care plan. This is a very pleasant 81 years old white male with multiple myeloma currently undergoing chemotherapy with weekly subcutaneous Velcade and Decadron status post 10 weekly doses and tolerated the treatment fairly well. The recent myeloma panel showed evidence for disease progression. I discussed the lab result with the patient and his wife. I recommended for him to continue with the same regimen but we will add Revlimid 25 mg by mouth daily for 21 days every 4 weeks to his current treatment. We will also start the patient on prophylactic dose of Coumadin 2 mg by mouth daily. The patient would come back for follow-up visit in 3 weeks for reevaluation and management of any adverse effect of his treatment. He was advised to call immediately if he has any concerning symptoms in the interval.  Disclaimer: This note was dictated with voice recognition software. Similar sounding words can inadvertently be transcribed and may be missed upon review. Eilleen Kempf., MD 10/03/2014

## 2014-09-27 NOTE — Telephone Encounter (Signed)
Per staff message and POF I have scheduled appts. Advised scheduler of appts. JMW  

## 2014-09-27 NOTE — Telephone Encounter (Signed)
Pt confirmed labs/ov per 02/24 POF, gave pt AVS.... KJ, sent msg to add chemo °

## 2014-09-27 NOTE — Patient Instructions (Signed)
[facility] Discharge Instructions for Patients Receiving Chemotherapy  Today you received the following chemotherapy agents: Velcade.  To help prevent nausea and vomiting after your treatment, we encourage you to take your nausea medication as prescribed.   If you develop nausea and vomiting that is not controlled by your nausea medication, call the clinic.   BELOW ARE SYMPTOMS THAT SHOULD BE REPORTED IMMEDIATELY:  *FEVER GREATER THAN 100.5 F  *CHILLS WITH OR WITHOUT FEVER  NAUSEA AND VOMITING THAT IS NOT CONTROLLED WITH YOUR NAUSEA MEDICATION  *UNUSUAL SHORTNESS OF BREATH  *UNUSUAL BRUISING OR BLEEDING  TENDERNESS IN MOUTH AND THROAT WITH OR WITHOUT PRESENCE OF ULCERS  *URINARY PROBLEMS  *BOWEL PROBLEMS  UNUSUAL RASH Items with * indicate a potential emergency and should be followed up as soon as possible.  Feel free to call the clinic you have any questions or concerns. The clinic phone number is [phone].    

## 2014-09-28 ENCOUNTER — Ambulatory Visit: Payer: Medicare Other | Admitting: Occupational Therapy

## 2014-09-28 ENCOUNTER — Other Ambulatory Visit: Payer: Self-pay | Admitting: Internal Medicine

## 2014-09-28 ENCOUNTER — Encounter: Payer: Self-pay | Admitting: Occupational Therapy

## 2014-09-28 ENCOUNTER — Ambulatory Visit: Payer: Medicare Other | Admitting: Rehabilitative and Restorative Service Providers"

## 2014-09-28 DIAGNOSIS — Z7409 Other reduced mobility: Secondary | ICD-10-CM

## 2014-09-28 DIAGNOSIS — D492 Neoplasm of unspecified behavior of bone, soft tissue, and skin: Secondary | ICD-10-CM | POA: Diagnosis not present

## 2014-09-28 DIAGNOSIS — R262 Difficulty in walking, not elsewhere classified: Secondary | ICD-10-CM

## 2014-09-28 DIAGNOSIS — IMO0002 Reserved for concepts with insufficient information to code with codable children: Secondary | ICD-10-CM

## 2014-09-28 DIAGNOSIS — C9000 Multiple myeloma not having achieved remission: Secondary | ICD-10-CM | POA: Diagnosis not present

## 2014-09-28 DIAGNOSIS — C7951 Secondary malignant neoplasm of bone: Secondary | ICD-10-CM | POA: Diagnosis not present

## 2014-09-28 DIAGNOSIS — R6889 Other general symptoms and signs: Secondary | ICD-10-CM | POA: Diagnosis not present

## 2014-09-28 DIAGNOSIS — M2560 Stiffness of unspecified joint, not elsewhere classified: Secondary | ICD-10-CM | POA: Diagnosis not present

## 2014-09-28 NOTE — Patient Instructions (Signed)
Straight Leg Raise   Tighten stomach and slowly raise locked right leg  6-8 inches from floor. Repeat __10__ times per set. Do _1___ sets per session. Do _2___ sessions per day.  http://orth.exer.us/1102   Copyright  VHI. All rights reserved.  Clam Shell 90 Degrees: Leg Lift   Lying with hips and knees bent 90, two pillows between knees and ankles. Rotate knee upward *do no lift leg*. Lower knee. Be sure pelvis does not roll backward. Do not arch back. Do _10__ times, each leg, __2_ times per day.  http://ss.exer.us/72   Copyright  VHI. All rights reserved.

## 2014-09-28 NOTE — Therapy (Signed)
[facility] [address] [place], [place], [address] Phone: [phone]   Fax:  [phone]  Physical Therapy Treatment  Patient Details  Name: Jeremiah Owens MRN: [id number] Date of Birth: 05/23/1930 Referring Provider:  Wenda Low, MD  Encounter Date: 09/28/2014      PT End of Session - 09/28/14 1151    Visit Number 6   Number of Visits 17   Date for PT Re-Evaluation 11/03/14   Authorization Type Medicare [employer], [employer] secondary G codes required   PT Start Time 1108   PT Stop Time 1150   PT Time Calculation (min) 42 min   Equipment Utilized During Treatment Gait belt   Activity Tolerance Patient tolerated treatment well   Behavior During Therapy [facility] for tasks assessed/performed      Past Medical History  Diagnosis Date  . Compression fracture   . Melanoma   . Bone cancer     t_spine T-5  . Prostate cancer 2002  . Skin cancer 1994    melanoma  right neck   . Multiple myeloma   . Allergy   . Anxiety   . Thyroid disease     Past Surgical History  Procedure Laterality Date  . Hernia repair    . Posterior cervical fusion/foraminotomy N/A 06/17/2014    Procedure: Thoracic five laminectomy for epidural tumor resection Thoracic 4-7 posterior lateral arthrodesis, segmental pedicle screw fixation.;  Surgeon: Ashok Pall, MD;  Location: [facility];  Service: Neurosurgery;  Laterality: N/A;    There were no vitals taken for this visit.  Visit Diagnosis:  Difficulty walking      Subjective Assessment - 09/28/14 1112    Symptoms The patient reports he was contacted by oncology clinic and is going to be adding chemotherapy to treatment regimine.  He is awaiting further information from insurance coverage, but reports he will be taking meds 21 days on, 7 days off.    Currently in Pain? Yes  OT addressing shoulder R side     THERAPEUTIC EXERCISE: Supine straight leg raise x 10 with cues on  quad contraction sidelying clambshells hip abduction x 10 reps R and L sides Standing heel raises x 20 without UE support with CGA Standing toe raises x 12 reps with min A without UE support (leans posteriorly) Standing side-stepping 10 ft x 4 reps R/L Sit<>stand x 8 reps with cues on pelvic initiation Standing reaching with L UE to increase upright posture  Gait: Ambulation with RW x 230 feet with cues on bilateral heel strike at initial contact of stance phase Ambulation with SPC and HHA x 130 ft with standing rest breaks with min A due to LE weakness and fatigue Gait anterior x 10 ft/posterior x 10 ft in parallel bars with CGA working on wider base of support        PT Education - 09/28/14 1141    Education provided Yes   Education Details HEP: clam shell sidelying hip abduction, straight leg raise   Person(s) Educated Patient   Methods Explanation;Demonstration;Handout   Comprehension Returned demonstration;Verbalized understanding          PT Short Term Goals - 09/04/14 1227    PT SHORT TERM GOAL #1   Title Pt will demonstrate independence with HEP Target: 10/06/14   PT SHORT TERM GOAL #2   Title Pt will increase BERG score to 36/56 for improving balance.  Target: 10/06/14   PT SHORT TERM GOAL #3   Title Pt  will Decrease TUG time to 14.69 seconds for improving efficiency with functional mobility using a single point cane. Target 10/06/14   PT SHORT TERM GOAL #4   Title Pt will ambulate independently on indoor level surfaces with cane.  Target: 10/06/14   PT SHORT TERM GOAL #5   Title Pt will perform sit to stand without UE support, independently.   Target: 10/06/14           PT Long Term Goals - 09/04/14 1230    PT LONG TERM GOAL #1   Title Pt will increase Berg score to 49/56 for improved balance.  Target 11/03/14   PT LONG TERM GOAL #2   Title Pt will ambulate independently on indoor level surfaces without assistive device.  Target 11/03/14   PT LONG TERM GOAL #3   Title  Pt will ambulate with suprervision on outdoor unlevel grass and concrete without assistive device. Target 11/03/14   PT LONG TERM GOAL #4   Title Pt will decrease TUG time to <13.5 seconds without assistive device for decreased fall risk and increased independence. Target 11/03/14   PT LONG TERM GOAL #5   Title Pt will increase gait speed to >2.63 ft/sec for independent community ambulator status. Target 11/03/14   Additional Long Term Goals   Additional Long Term Goals Yes   PT LONG TERM GOAL #6   Title Increase FOTO ABC to 30% for improved balance conficdence.               Plan - 09/28/14 1207    Clinical Impression Statement The patient has narrow base of support during gait activities due to hip weakness.  PT added hip strengthening activities to HEP and plans to progress gait as able off of RW.  Continue to STGs/LTGs.   PT Next Visit Plan Standing balance activities, alternating weight shift to touch targets, hip abduction strengthening, progress gait as able.   Consulted and Agree with Plan of Care Patient        Problem List Patient Active Problem List   Diagnosis Date Noted  . Gait disorder 09/05/2014  . Multiple myeloma   . Paraplegia 06/21/2014  . Postoperative anemia due to acute blood loss 06/21/2014  . 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 Spine metastasis 06/17/2014  . Thoracic spine tumor 06/17/2014  . Metastatic bone cancer 06/15/2014    Swain Acree, PT 09/28/2014, 12:12 PM  [facility] [address] [place], [place], [address] Phone: [phone]   Fax:  [phone]

## 2014-09-28 NOTE — Therapy (Signed)
[facility] [address] [place], [place], [address] Phone: [phone]   Fax:  [phone]  Occupational Therapy Treatment  Patient Details  Name: Jeremiah Owens MRN: [id number] Date of Birth: Jul 29, 1930 Referring Provider:  Wenda Low, MD  Encounter Date: 09/28/2014      OT End of Session - 09/28/14 1134    Visit Number 6   Number of Visits 17   Date for OT Re-Evaluation 11/02/14   Authorization Type MCR - G CODE NEEDED   OT Start Time 1017   OT Stop Time 1100   OT Time Calculation (min) 43 min   Activity Tolerance Patient tolerated treatment well      Past Medical History  Diagnosis Date  . Compression fracture   . Melanoma   . Bone cancer     t_spine T-5  . Prostate cancer 2002  . Skin cancer 1994    melanoma  right neck   . Multiple myeloma   . Allergy   . Anxiety   . Thyroid disease     Past Surgical History  Procedure Laterality Date  . Hernia repair    . Posterior cervical fusion/foraminotomy N/A 06/17/2014    Procedure: Thoracic five laminectomy for epidural tumor resection Thoracic 4-7 posterior lateral arthrodesis, segmental pedicle screw fixation.;  Surgeon: Ashok Pall, MD;  Location: [facility];  Service: Neurosurgery;  Laterality: N/A;    There were no vitals taken for this visit.  Visit Diagnosis:  Decreased functional mobility and endurance  Stiffness of joint of upper extremity      Subjective Assessment - 09/28/14 1025    Symptoms If I reach up I get some pain (above 90 degrees).   Pertinent History active CA, LUE shoulder impairments premorbidly approx. 75%    Repetition Increases Symptoms   Patient Stated Goals relieve pain and improve reach in right arm   Currently in Pain? Yes   Pain Score 1    Pain Location Shoulder   Pain Orientation Right   Pain Descriptors / Indicators Aching   Pain Type Acute pain  about three weeks ago   Pain Onset 1 to 4 weeks ago   Pain Frequency Intermittent   Aggravating Factors  reaching or lifting objects at or above midrange   Pain Relieving Factors rest, reposition, alignment.                   OT Treatments/Exercises (OP) - 09/28/14 0001    ADLs   Cooking Pt able to make hot breakfast at ambulatory level with walker and (walker tray) with distant supervision.  Pt able to make eggs, toast and transport items in [place] and to table using tray.  Pt frequently comes into staggered stance during activity putting him at much greater risk for LOB.  Pt unaware -discussed trying to avoid this and encouraged pt to double check foot placement before he reaches for items.  Pt  verbalized understanding.  Pt also able to load dishwasher with distant supervision. Pt is cooking at home and reports he has had LOB but not actually fallen.  Recommended that wife be with him just in case and pt agreeable saying "we usually cook together"   Exercises   Exercises Shoulder   Shoulder Exercises: Supine   Other Supine Exercises Reviewed HEP at pt's request as pt stated "I am not sure I am doing it correctly" Pt with some L shoulder pain with ER/IR in supine with cane. Benefitted from pillow under  L elbow to improve alignment and pt verablized understanding of positioning. Pt needed min vc's                  OT Short Term Goals - 09/28/14 1137    OT SHORT TERM GOAL #1   Title Independent w/ initial HEP for Lt shoulder (due 10/03/14)   Status On-going   OT SHORT TERM GOAL #2   Title Pt to perform 10 min . of UE exercise w/o rest    Status On-going   OT SHORT TERM GOAL #3   Title Pt to perform cooking task w/ supervision only using walker w/o LOB   Status Achieved           OT Long Term Goals - 09/28/14 1137    OT LONG TERM GOAL #1   Title Independent w/ updated HEP prn (due 11/02/14)   Status On-going   OT LONG TERM GOAL #2   Title Pt to perform dynamic standing tasks for 10 minutes or greater w/o LOB    Status On-going   OT LONG TERM GOAL #3   Title Pt/family to verbally id strategies for safety during IADLS    Status On-going               Plan - 09/28/14 1135    Clinical Impression Statement Pt making slow progress toward goals.  Pt participated in cooking activities and reviewed HEP. Able to tolerate approximately 30 min ambulatory activity with brief rest breaks against counter.   Pt will benefit from skilled therapeutic intervention in order to improve on the following deficits (Retired) Decreased activity tolerance;Decreased balance;Decreased endurance;Decreased range of motion;Decreased strength;Impaired UE functional use;Improper body mechanics;Impaired tone   Rehab Potential Good   OT Frequency 2x / week   OT Duration 8 weeks   Plan check HEP, dynamic balance and activity tolerance through functional tasks.   Consulted and Agree with Plan of Care Patient        Problem List Patient Active Problem List   Diagnosis Date Noted  . Gait disorder 09/05/2014  . Multiple myeloma   . Paraplegia 06/21/2014  . Postoperative anemia due to acute blood loss 06/21/2014  . 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 Spine metastasis 06/17/2014  . Thoracic spine tumor 06/17/2014  . Metastatic bone cancer 06/15/2014    Quay Burow, OTR/L 09/28/2014, 11:38 AM  [facility] [address] [place], [place], [address] Phone: [phone]   Fax:  [phone]

## 2014-09-30 ENCOUNTER — Other Ambulatory Visit: Payer: Self-pay | Admitting: Physical Medicine & Rehabilitation

## 2014-10-02 ENCOUNTER — Encounter: Payer: Self-pay | Admitting: Occupational Therapy

## 2014-10-02 ENCOUNTER — Ambulatory Visit: Payer: Medicare Other | Admitting: Physical Therapy

## 2014-10-02 ENCOUNTER — Ambulatory Visit: Payer: Medicare Other | Admitting: Occupational Therapy

## 2014-10-02 ENCOUNTER — Encounter: Payer: Self-pay | Admitting: Radiation Oncology

## 2014-10-02 DIAGNOSIS — IMO0002 Reserved for concepts with insufficient information to code with codable children: Secondary | ICD-10-CM

## 2014-10-02 DIAGNOSIS — Z7409 Other reduced mobility: Secondary | ICD-10-CM | POA: Diagnosis not present

## 2014-10-02 DIAGNOSIS — R262 Difficulty in walking, not elsewhere classified: Secondary | ICD-10-CM

## 2014-10-02 DIAGNOSIS — M25511 Pain in right shoulder: Secondary | ICD-10-CM

## 2014-10-02 DIAGNOSIS — C7951 Secondary malignant neoplasm of bone: Secondary | ICD-10-CM | POA: Diagnosis not present

## 2014-10-02 DIAGNOSIS — C9000 Multiple myeloma not having achieved remission: Secondary | ICD-10-CM | POA: Diagnosis not present

## 2014-10-02 DIAGNOSIS — R6889 Other general symptoms and signs: Secondary | ICD-10-CM | POA: Diagnosis not present

## 2014-10-02 DIAGNOSIS — M2560 Stiffness of unspecified joint, not elsewhere classified: Secondary | ICD-10-CM

## 2014-10-02 DIAGNOSIS — D492 Neoplasm of unspecified behavior of bone, soft tissue, and skin: Secondary | ICD-10-CM | POA: Diagnosis not present

## 2014-10-02 NOTE — Therapy (Signed)
[facility] [address] [place], [place], [address] Phone: [phone]   Fax:  [phone]  Occupational Therapy Treatment  Patient Details  Name: Jeremiah Owens MRN: [id number] Date of Birth: 23-Feb-1930 Referring Provider:  Wenda Low, MD  Encounter Date: 10/02/2014      OT End of Session - 10/02/14 1246    Visit Number 7   Number of Visits 17   Date for OT Re-Evaluation 11/02/14   Authorization Type MCR - G CODE NEEDED   OT Start Time 1016   OT Stop Time 1100   OT Time Calculation (min) 44 min   Activity Tolerance Patient tolerated treatment well      Past Medical History  Diagnosis Date  . Compression fracture   . Melanoma   . Bone cancer     t_spine T-5  . Prostate cancer 2002  . Skin cancer 1994    melanoma  right neck   . Multiple myeloma   . Allergy   . Anxiety   . Thyroid disease     Past Surgical History  Procedure Laterality Date  . Hernia repair    . Posterior cervical fusion/foraminotomy N/A 06/17/2014    Procedure: Thoracic five laminectomy for epidural tumor resection Thoracic 4-7 posterior lateral arthrodesis, segmental pedicle screw fixation.;  Surgeon: Ashok Pall, MD;  Location: [facility];  Service: Neurosurgery;  Laterality: N/A;    There were no vitals taken for this visit.  Visit Diagnosis:  Pain in joint, shoulder region, right  Stiffness of joint of upper extremity      Subjective Assessment - 10/02/14 1021    Symptoms I still have so much trouble reaching to do anything   Pertinent History active CA, LUE shoulder impairments premorbidly approx. 75%    Patient Stated Goals relieve pain and improve reach in right arm   Currently in Pain? Yes   Pain Score 4   with mid reach   Pain Location Shoulder   Pain Orientation Right   Pain Descriptors / Indicators Burning   Pain Type Acute pain   Pain Onset More than a month ago   Pain Frequency Intermittent   Aggravating Factors  reaching or lifiting objects at or above midrange   Pain Relieving Factors rest, reposition, alignment                 OT Treatments/Exercises (OP) - 10/02/14 0001    Neurological Re-education Exercises   Other Exercises 1 Neuro re ed to address improved biomechanics with mid level reach. Pt with shoulder pain which he states started when he began outpatient therapy. Pt with significant upper thoraxic flexion causing too much scapular elevation, abduction and downward rotation for mid to high level reach.Pt with no pain with full shoulder flexion in suping.  Mod facilitation for scapular humeral rythm and scapular stability on trunk for reach to 95 degrees without pain.  Discussed at length pelvic position relative to reach and "setting" his posture prior to reaching in sitting and standing. With facilitation pt does not experience pain. Also taught pt assisted reach to decrease pain with functional tasks at home.                  OT Short Term Goals - 10/02/14 1248    OT SHORT TERM GOAL #1   Title Independent w/ initial HEP for Lt shoulder (due 10/03/14)   Status Achieved   OT SHORT TERM GOAL #2   Title Pt to perform  10 min . of UE exercise w/o rest    Status Achieved   OT SHORT TERM GOAL #3   Title Pt to perform cooking task w/ supervision only using walker w/o LOB   Status Achieved           OT Long Term Goals - 10/02/14 1249    OT LONG TERM GOAL #1   Title Independent w/ updated HEP prn (due 11/02/14)   Status On-going   OT LONG TERM GOAL #2   Title Pt to perform dynamic standing tasks for 10 minutes or greater w/o LOB   Status On-going   OT LONG TERM GOAL #3   Title Pt/family to verbally id strategies for safety during IADLS    Status On-going               Plan - 10/02/14 1247    Clinical Impression Statement Pt with shoulder pain with functional reach related in least in part to biomechanics of the shoulder and trunk control.  Pt continues with slow progress toward goals.   Pt will benefit from skilled therapeutic intervention in order to improve on the following deficits (Retired) Decreased activity tolerance;Decreased balance;Decreased endurance;Decreased range of motion;Decreased strength;Impaired UE functional use;Improper body mechanics;Impaired tone   Rehab Potential Good   OT Frequency 2x / week   OT Duration 8 weeks   OT Treatment/Interventions Self-care/ADL training;Therapeutic exercise;Moist Heat;Neuromuscular education;Functional Mobility Training;Energy conservation;Patient/family education;DME and/or AE instruction;Passive range of motion;Therapeutic activities   Plan trunk control relative to functional reach in sitting and standing, scapular stability through range   Consulted and Agree with Plan of Care Patient        Problem List Patient Active Problem List   Diagnosis Date Noted  . Gait disorder 09/05/2014  . Multiple myeloma   . Paraplegia 06/21/2014  . Postoperative anemia due to acute blood loss 06/21/2014  . 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 Spine metastasis 06/17/2014  . Thoracic spine tumor 06/17/2014  . Metastatic bone cancer 06/15/2014    Quay Burow, OTR/L 10/02/2014, 12:50 PM  [facility] [address] [place], [place], [address] Phone: [phone]   Fax:  [phone]

## 2014-10-02 NOTE — Therapy (Signed)
[facility] [address] [place], [place], [address] Phone: [phone]   Fax:  [phone]  Physical Therapy Treatment  Patient Details  Name: Jeremiah Owens MRN: [id number] Date of Birth: 06/13/1930 Referring Provider:  Wenda Low, MD  Encounter Date: 10/02/2014      PT End of Session - 10/02/14 1735    Visit Number 7   Number of Visits 17   Date for PT Re-Evaluation 11/03/14   Authorization Type Medicare Triad Primary, T[employer]are secondary G codes required   PT Start Time 0935   PT Stop Time 1015   PT Time Calculation (min) 40 min   Equipment Utilized During Treatment Gait belt   Activity Tolerance Patient tolerated treatment well   Behavior During Therapy [facility] for tasks assessed/performed      Past Medical History  Diagnosis Date  . Compression fracture   . Melanoma   . Bone cancer     t_spine T-5  . Prostate cancer 2002  . Skin cancer 1994    melanoma  right neck   . Multiple myeloma   . Allergy   . Anxiety   . Thyroid disease   . S/P radiation therapy 08/21/14-09/04/14    T4-6 25Gy/78f    Past Surgical History  Procedure Laterality Date  . Hernia repair    . Posterior cervical fusion/foraminotomy N/A 06/17/2014    Procedure: Thoracic five laminectomy for epidural tumor resection Thoracic 4-7 posterior lateral arthrodesis, segmental pedicle screw fixation.;  Surgeon: KAshok Pall MD;  [facility];  Service: Neurosurgery;  Laterality: N/A;    There were no vitals taken for this visit.  Visit Diagnosis:  Difficulty walking      Subjective Assessment - 10/02/14 0939    Symptoms Had a busy day yesterday- church, eating out, then rested.  I want to be able to get stronger.   Currently in Pain? No/denies        THERAPEUTIC EXERCISE: Supine straight leg raise x 10 with cues on quad contraction and  sidelying clambshells hip abduction x 10 reps R and L sides, 2 sets (cues provided to  prevent excessive hip external rotation) Supine hooklying resisted hip abduction with red theraband, 10 reps right and left. Standing heel raises x 20 withUE support with CGA Standing toe raises x 20 reps with UE support  Standing side-stepping 10 ft x 5 reps R/L Sit<>stand x 10 reps with cues on pelvic initiation and cues for increased forward lean  Neuro Reeducation: In parallel bars:  Alternating step taps to 4 inch, then 6 inch block with UE support.  Forward step over and side step over obstacles with cues for upright posture and increased step length.  Forward/backward stepping in parallel bars.                   PT Education - 10/02/14 1735    Education provided Yes   Education Details Reviewed HEP from last visit; provided instructions for proper technique   Person(s) Educated Patient   Methods Explanation;Demonstration   Comprehension Verbalized understanding;Returned demonstration          PT Short Term Goals - 09/04/14 1227    PT SHORT TERM GOAL #1   Title Pt will demonstrate independence with HEP Target: 10/06/14   PT SHORT TERM GOAL #2   Title Pt will increase BERG score to 36/56 for improving balance.  Target: 10/06/14   PT SHORT TERM GOAL #3   Title Pt will Decrease  TUG time to 14.69 seconds for improving efficiency with functional mobility using a single point cane. Target 10/06/14   PT SHORT TERM GOAL #4   Title Pt will ambulate independently on indoor level surfaces with cane.  Target: 10/06/14   PT SHORT TERM GOAL #5   Title Pt will perform sit to stand without UE support, independently.   Target: 10/06/14           PT Long Term Goals - 09/04/14 1230    PT LONG TERM GOAL #1   Title Pt will increase Berg score to 49/56 for improved balance.  Target 11/03/14   PT LONG TERM GOAL #2   Title Pt will ambulate independently on indoor level surfaces without assistive device.  Target 11/03/14   PT LONG TERM GOAL #3   Title Pt will ambulate with suprervision on  outdoor unlevel grass and concrete without assistive device. Target 11/03/14   PT LONG TERM GOAL #4   Title Pt will decrease TUG time to <13.5 seconds without assistive device for decreased fall risk and increased independence. Target 11/03/14   PT LONG TERM GOAL #5   Title Pt will increase gait speed to >2.63 ft/sec for independent community ambulator status. Target 11/03/14   Additional Long Term Goals   Additional Long Term Goals Yes   PT LONG TERM GOAL #6   Title Increase FOTO ABC to 30% for improved balance conficdence.               Plan - 10/02/14 1736    Clinical Impression Statement Provided cues for proper performance of HEP; pt performs standing hip exercises needing UE support.  Pt will continue to benefit from further skilled PT to further address balance, strength, and gait.   Pt will benefit from skilled therapeutic intervention in order to improve on the following deficits Abnormal gait;Decreased balance;Difficulty walking;Decreased strength;Postural dysfunction   Rehab Potential Good   PT Frequency 2x / week   PT Duration 8 weeks   PT Treatment/Interventions ADLs/Self Care Home Management;Gait training;Therapeutic exercise;Patient/family education;Stair training;Balance training;Functional mobility training;Neuromuscular re-education;Manual techniques;Therapeutic activities;DME Instruction   PT Next Visit Plan Standing balance activities, alternating weight shift to touch targets, hip abduction strengthening, progress gait as able.   Consulted and Agree with Plan of Care Patient        Problem List Patient Active Problem List   Diagnosis Date Noted  . Gait disorder 09/05/2014  . Multiple myeloma   . Paraplegia 06/21/2014  . Postoperative anemia due to acute blood loss 06/21/2014  . 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 Spine metastasis 06/17/2014  . Thoracic spine tumor 06/17/2014  . Metastatic bone cancer 06/15/2014    Wataru Mccowen W. 10/02/2014, 5:40 PM    Mady Haagensen, PT 10/02/2014 5:41 PM Phone: [phone] Fax: [facility] [address] [place], [place], [address] Phone: [phone]   Fax:  [phone]

## 2014-10-03 NOTE — Patient Instructions (Signed)
Your recent protein studies showed evidence for disease progression. We are adding Revlimid 25 mg by mouth daily for 3 weeks and off for 1 week as well as prophylactic dose Coumadin at 2 mg by mouth daily to your treatment regimen. See her dentist for written dental clearance to begin Zometa therapy to help strengthen your bones Follow-up in 3 weeks

## 2014-10-04 ENCOUNTER — Telehealth: Payer: Self-pay | Admitting: Medical Oncology

## 2014-10-04 ENCOUNTER — Other Ambulatory Visit (HOSPITAL_BASED_OUTPATIENT_CLINIC_OR_DEPARTMENT_OTHER): Payer: Medicare Other

## 2014-10-04 ENCOUNTER — Other Ambulatory Visit: Payer: Self-pay | Admitting: Medical Oncology

## 2014-10-04 ENCOUNTER — Ambulatory Visit (HOSPITAL_BASED_OUTPATIENT_CLINIC_OR_DEPARTMENT_OTHER): Payer: Medicare Other

## 2014-10-04 DIAGNOSIS — C9002 Multiple myeloma in relapse: Secondary | ICD-10-CM

## 2014-10-04 DIAGNOSIS — Z5112 Encounter for antineoplastic immunotherapy: Secondary | ICD-10-CM

## 2014-10-04 DIAGNOSIS — C9000 Multiple myeloma not having achieved remission: Secondary | ICD-10-CM

## 2014-10-04 LAB — COMPREHENSIVE METABOLIC PANEL (CC13)
ALT: 13 U/L (ref 0–55)
ANION GAP: 10 meq/L (ref 3–11)
AST: 20 U/L (ref 5–34)
Albumin: 3.4 g/dL — ABNORMAL LOW (ref 3.5–5.0)
Alkaline Phosphatase: 77 U/L (ref 40–150)
BUN: 19 mg/dL (ref 7.0–26.0)
CO2: 24 meq/L (ref 22–29)
Calcium: 9.2 mg/dL (ref 8.4–10.4)
Chloride: 104 mEq/L (ref 98–109)
Creatinine: 1 mg/dL (ref 0.7–1.3)
EGFR: 70 mL/min/{1.73_m2} — AB (ref >90)
GLUCOSE: 117 mg/dL (ref 70–140)
POTASSIUM: 4.3 meq/L (ref 3.5–5.1)
Sodium: 137 mEq/L (ref 136–145)
TOTAL PROTEIN: 7.4 g/dL (ref 6.4–8.3)
Total Bilirubin: 0.49 mg/dL (ref 0.20–1.20)

## 2014-10-04 LAB — CBC WITH DIFFERENTIAL/PLATELET
BASO%: 0.2 % (ref 0.0–2.0)
BASOS ABS: 0 10*3/uL (ref 0.0–0.1)
EOS%: 2.4 % (ref 0.0–7.0)
Eosinophils Absolute: 0.1 10*3/uL (ref 0.0–0.5)
HCT: 36.8 % — ABNORMAL LOW (ref 38.4–49.9)
HEMOGLOBIN: 12 g/dL — AB (ref 13.0–17.1)
LYMPH#: 0.4 10*3/uL — AB (ref 0.9–3.3)
LYMPH%: 11.9 % — AB (ref 14.0–49.0)
MCH: 31.6 pg (ref 27.2–33.4)
MCHC: 32.6 g/dL (ref 32.0–36.0)
MCV: 97 fL (ref 79.3–98.0)
MONO#: 0.4 10*3/uL (ref 0.1–0.9)
MONO%: 12.1 % (ref 0.0–14.0)
NEUT#: 2.6 10*3/uL (ref 1.5–6.5)
NEUT%: 73.4 % (ref 39.0–75.0)
PLATELETS: 136 10*3/uL — AB (ref 140–400)
RBC: 3.8 10*6/uL — ABNORMAL LOW (ref 4.20–5.82)
RDW: 14.1 % (ref 11.0–14.6)
WBC: 3.6 10*3/uL — ABNORMAL LOW (ref 4.0–10.3)

## 2014-10-04 MED ORDER — BORTEZOMIB CHEMO SQ INJECTION 3.5 MG (2.5MG/ML)
1.3000 mg/m2 | Freq: Once | INTRAMUSCULAR | Status: AC
  Administered 2014-10-04: 2.5 mg via SUBCUTANEOUS
  Filled 2014-10-04: qty 2.5

## 2014-10-04 MED ORDER — ONDANSETRON HCL 8 MG PO TABS
ORAL_TABLET | ORAL | Status: AC
  Filled 2014-10-04: qty 1

## 2014-10-04 MED ORDER — ONDANSETRON HCL 8 MG PO TABS
8.0000 mg | ORAL_TABLET | Freq: Once | ORAL | Status: AC
  Administered 2014-10-04: 8 mg via ORAL

## 2014-10-04 NOTE — Telephone Encounter (Signed)
Wife notified that revlimid coming from [facility] and she has number to f/u if she doesn't hear from them in 2-3 days

## 2014-10-04 NOTE — Telephone Encounter (Signed)
[facility] staff pt rx on 2/24 revlimid rx forwarded to [facility]. I was transferred to [facility] and med is processing and may take 48-72 hours.

## 2014-10-04 NOTE — Patient Instructions (Signed)
[facility] Discharge Instructions for Patients Receiving Chemotherapy  Today you received the following chemotherapy agents: Velcade. To help prevent nausea and vomiting after your treatment, we encourage you to take your nausea medication.  If you develop nausea and vomiting that is not controlled by your nausea medication, call the clinic.   BELOW ARE SYMPTOMS THAT SHOULD BE REPORTED IMMEDIATELY:  *FEVER GREATER THAN 100.5 F  *CHILLS WITH OR WITHOUT FEVER  NAUSEA AND VOMITING THAT IS NOT CONTROLLED WITH YOUR NAUSEA MEDICATION  *UNUSUAL SHORTNESS OF BREATH  *UNUSUAL BRUISING OR BLEEDING  TENDERNESS IN MOUTH AND THROAT WITH OR WITHOUT PRESENCE OF ULCERS  *URINARY PROBLEMS  *BOWEL PROBLEMS  UNUSUAL RASH Items with * indicate a potential emergency and should be followed up as soon as possible.  Feel free to call the clinic you have any questions or concerns. The clinic phone number is [phone].    

## 2014-10-05 ENCOUNTER — Ambulatory Visit
Admission: RE | Admit: 2014-10-05 | Discharge: 2014-10-05 | Disposition: A | Discharge status: Home / Self Care | Payer: Medicare Other | Source: Ambulatory Visit | Attending: Radiation Oncology | Admitting: Radiation Oncology

## 2014-10-05 ENCOUNTER — Ambulatory Visit
Payer: Medicare Other | Attending: Physical Medicine & Rehabilitation | Admitting: Rehabilitative and Restorative Service Providers"

## 2014-10-05 ENCOUNTER — Other Ambulatory Visit: Payer: Self-pay | Admitting: Internal Medicine

## 2014-10-05 ENCOUNTER — Ambulatory Visit: Payer: Medicare Other | Attending: Physical Medicine & Rehabilitation | Admitting: Occupational Therapy

## 2014-10-05 ENCOUNTER — Encounter: Payer: Self-pay | Admitting: Radiation Oncology

## 2014-10-05 VITALS — BP 134/63 | HR 71 | Temp 98.3°F | Resp 12 | Wt 170.0 lb

## 2014-10-05 DIAGNOSIS — R293 Abnormal posture: Secondary | ICD-10-CM

## 2014-10-05 DIAGNOSIS — Z7409 Other reduced mobility: Secondary | ICD-10-CM | POA: Diagnosis not present

## 2014-10-05 DIAGNOSIS — C7951 Secondary malignant neoplasm of bone: Secondary | ICD-10-CM | POA: Insufficient documentation

## 2014-10-05 DIAGNOSIS — M25511 Pain in right shoulder: Secondary | ICD-10-CM | POA: Diagnosis not present

## 2014-10-05 DIAGNOSIS — R262 Difficulty in walking, not elsewhere classified: Secondary | ICD-10-CM | POA: Diagnosis not present

## 2014-10-05 DIAGNOSIS — C9000 Multiple myeloma not having achieved remission: Secondary | ICD-10-CM | POA: Diagnosis not present

## 2014-10-05 DIAGNOSIS — M2560 Stiffness of unspecified joint, not elsewhere classified: Secondary | ICD-10-CM | POA: Insufficient documentation

## 2014-10-05 DIAGNOSIS — C419 Malignant neoplasm of bone and articular cartilage, unspecified: Secondary | ICD-10-CM

## 2014-10-05 DIAGNOSIS — R6889 Other general symptoms and signs: Secondary | ICD-10-CM | POA: Insufficient documentation

## 2014-10-05 DIAGNOSIS — IMO0002 Reserved for concepts with insufficient information to code with codable children: Secondary | ICD-10-CM

## 2014-10-05 DIAGNOSIS — D492 Neoplasm of unspecified behavior of bone, soft tissue, and skin: Secondary | ICD-10-CM | POA: Insufficient documentation

## 2014-10-05 HISTORY — DX: Personal history of irradiation: Z92.3

## 2014-10-05 NOTE — Progress Notes (Signed)
He is currently in no pain.  Pt complains of fatigue and weakness. No falls.  Goes to PT and OT two times a week.  Upper and lower extremities are equally strong.   Pt denies dysphagia. Reports GERD is being managed. Reports occasionally he has difficult swallowing pills.  BP 134/63 mmHg  Pulse 71  Temp(Src) 98.3 F (36.8 C) (Oral)  Resp 12  Wt 170 lb (77.111 kg)  SpO2 100%

## 2014-10-05 NOTE — Therapy (Signed)
[facility] [address] [place], [place], [address] Phone: [phone]   Fax:  [phone]  Occupational Therapy Treatment  Patient Details  Name: Jeremiah Owens MRN: [id number] Date of Birth: May 31, 1930 Referring Provider:  Wenda Low, MD  Encounter Date: 10/05/2014      OT End of Session - 10/05/14 1304    Visit Number 8   Number of Visits 17   Date for OT Re-Evaluation 11/02/14   Authorization Type MCR - G CODE NEEDED   OT Start Time 1017   OT Stop Time 1100   OT Time Calculation (min) 43 min   Activity Tolerance Patient tolerated treatment well      Past Medical History  Diagnosis Date  . Compression fracture   . Melanoma   . Bone cancer     t_spine T-5  . Prostate cancer 2002  . Skin cancer 1994    melanoma  right neck   . Multiple myeloma   . Allergy   . Anxiety   . Thyroid disease   . S/P radiation therapy 08/21/14-09/04/14    T4-6 25Gy/10f    Past Surgical History  Procedure Laterality Date  . Hernia repair    . Posterior cervical fusion/foraminotomy N/A 06/17/2014    Procedure: Thoracic five laminectomy for epidural tumor resection Thoracic 4-7 posterior lateral arthrodesis, segmental pedicle screw fixation.;  Surgeon: KAshok Pall MD;  [facility];  Service: Neurosurgery;  Laterality: N/A;    There were no vitals taken for this visit.  Visit Diagnosis:  Posture abnormality  Pain in joint, shoulder region, right  Stiffness of joint of upper extremity      Subjective Assessment - 10/05/14 1026    Pertinent History (p) active CA, LUE shoulder impairments premorbidly approx. 75%    Repetition (p) Increases Symptoms   Patient Stated Goals (p) relieve pain and improve reach in right arm   Currently in Pain? (p) Yes   Pain Score (p) 0-No pain   Pain Location (p) Shoulder   Pain Orientation (p) Right   Pain Descriptors / Indicators (p) Nagging   Pain Type (p) Chronic pain    Pain Onset (p) More than a month ago   Pain Frequency (p) Intermittent   Aggravating Factors  (p) reaching or lifting objects at or above midrange                 OT Treatments/Exercises (OP) - 10/05/14 1257    Neurological Re-education Exercises   Scapular Stabilization Right;Seated  while performing RUE AA/ROM in flexion with cane vertically   Other Exercises 1 Pt shown passive trunk stretch in supine with towel  to balance/maintain muscle integrity b/t thoracic and pectoralis major and minor muscles. Towel stretch also to increase ROM in trunk extension.    Other Exercises 2 Active engagement of trunk extension in standing (against wall) with lumbar noodle to maintain anterior pelvic tilt.    Trunk Exercises Extension;Core Activation   Trunk Core Activation Focus on trunk extension without compromising pelvic motion: focused on active engagement of trunk extension with anterior pelvic tilt seated and standing with back supported at wall; and  with manual facilitation to scapula for Rt shoulder movement  w/o pain to regain normal upward/downward rotation of scapula and scapulohumeral rhythm.                   OT Short Term Goals - 10/02/14 1248    OT SHORT TERM GOAL #1  Title Independent w/ initial HEP for Lt shoulder (due 10/03/14)   Status Achieved   OT SHORT TERM GOAL #2   Title Pt to perform 10 min . of UE exercise w/o rest    Status Achieved   OT SHORT TERM GOAL #3   Title Pt to perform cooking task w/ supervision only using walker w/o LOB   Status Achieved           OT Long Term Goals - 10/02/14 1249    OT LONG TERM GOAL #1   Title Independent w/ updated HEP prn (due 11/02/14)   Status On-going   OT LONG TERM GOAL #2   Title Pt to perform dynamic standing tasks for 10 minutes or greater w/o LOB   Status On-going   OT LONG TERM GOAL #3   Title Pt/family to verbally id strategies for safety during IADLS    Status On-going                Plan - 10/05/14 1305    Clinical Impression Statement Pt able to prevent pain in Rt shoulder when therapist provided manual facilitation and stabalization to scapula during AA/ROM against gravity with focus on posture/trunk control.    Plan continue trunk mobility and control with scapular facilitation during RUE movement, (try AA/ROM in sh. flex using PVC frame in standing with back against wall with ball at thoracic spine)        Problem List Patient Active Problem List   Diagnosis Date Noted  . Gait disorder 09/05/2014  . Multiple myeloma   . Paraplegia 06/21/2014  . Postoperative anemia due to acute blood loss 06/21/2014  . 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 Spine metastasis 06/17/2014  . Thoracic spine tumor 06/17/2014  . Metastatic bone cancer 06/15/2014    Carey Bullocks, OTR/L  10/05/2014, 1:09 PM  [facility] [address] [place], [place], [address] Phone: [phone]   Fax:  [phone]

## 2014-10-05 NOTE — Therapy (Signed)
[facility] [address] [place], [place], [address] Phone: [phone]   Fax:  [phone]  Physical Therapy Treatment  Patient Details  Name: Jeremiah Owens MRN: [id number] Date of Birth: [birth year] Referring Provider:  Wenda Low, MD  Encounter Date: 10/05/2014      PT End of Session - 10/05/14 1409    Visit Number 8  G 8   Number of Visits 17   Date for PT Re-Evaluation 11/03/14   Authorization Type Medicare Triad Primary, T[employer]are secondary G codes required   PT Start Time 0940   PT Stop Time 1020   PT Time Calculation (min) 40 min   Equipment Utilized During Treatment Gait belt   Activity Tolerance Patient tolerated treatment well   Behavior During Therapy [facility] for tasks assessed/performed      Past Medical History  Diagnosis Date  . Compression fracture   . Melanoma   . Bone cancer     t_spine T-5  . Prostate cancer 2002  . Skin cancer 1994    melanoma  right neck   . Multiple myeloma   . Allergy   . Anxiety   . Thyroid disease   . S/P radiation therapy 08/21/14-09/04/14    T4-6 25Gy/88f    Past Surgical History  Procedure Laterality Date  . Hernia repair    . Posterior cervical fusion/foraminotomy N/A 06/17/2014    Procedure: Thoracic five laminectomy for epidural tumor resection Thoracic 4-7 posterior lateral arthrodesis, segmental pedicle screw fixation.;  Surgeon: KAshok Pall MD;  [facility];  Service: Neurosurgery;  Laterality: N/A;    There were no vitals taken for this visit.  Visit Diagnosis:  Difficulty walking  Posture abnormality      Subjective Assessment - 10/05/14 1406    Symptoms The patient reports his feet sometimes kick each other when fatigued.  PT described hip weakness contributing to this issue.     THERAPEUTIC EXERCISE: Step-ups R and L sides x 5 reps to 6" step with one UE support and CGA Step downs 5x each side with bilateral UE support with  CGA Wall slides x 8 reps with CGA Sit<>stand>toe raise without UE support with CGA to min A for balance Seated hamstring stretch  NEUROMUSCULAR RE-EDUCATION: Alternating Le foot taps to 6" step R and L side decreasing UE support Standing moving R and then L foot into/out of stride position with CGA for safety Wall bumps with CGA to SBA Physioball sitting with LE marching and attempted LAQs with min A for postural support and balance with cues on upright posture and pelvic initiation  Gait: RW mod indep with cues on posture x 140 ft SPC with min A adding HHA when fatigued x 50 ft x 2         PT Short Term Goals - 09/04/14 1227    PT SHORT TERM GOAL #1   Title Pt will demonstrate independence with HEP Target: 10/06/14   PT SHORT TERM GOAL #2   Title Pt will increase BERG score to 36/56 for improving balance.  Target: 10/06/14   PT SHORT TERM GOAL #3   Title Pt will Decrease TUG time to 14.69 seconds for improving efficiency with functional mobility using a single point cane. Target 10/06/14   PT SHORT TERM GOAL #4   Title Pt will ambulate independently on indoor level surfaces with cane.  Target: 10/06/14   PT SHORT TERM GOAL #5   Title Pt will perform sit to stand  without UE support, independently.   Target: 10/06/14           PT Long Term Goals - 09/04/14 1230    PT LONG TERM GOAL #1   Title Pt will increase Berg score to 49/56 for improved balance.  Target 11/03/14   PT LONG TERM GOAL #2   Title Pt will ambulate independently on indoor level surfaces without assistive device.  Target 11/03/14   PT LONG TERM GOAL #3   Title Pt will ambulate with suprervision on outdoor unlevel grass and concrete without assistive device. Target 11/03/14   PT LONG TERM GOAL #4   Title Pt will decrease TUG time to <13.5 seconds without assistive device for decreased fall risk and increased independence. Target 11/03/14   PT LONG TERM GOAL #5   Title Pt will increase gait speed to >2.63 ft/sec for  independent community ambulator status. Target 11/03/14   Additional Long Term Goals   Additional Long Term Goals Yes   PT LONG TERM GOAL #6   Title Increase FOTO ABC to 30% for improved balance conficdence.               Plan - 10/05/14 1409    Clinical Impression Statement The patient is progressing with gait and strength activities.  He continues with postural and hip weakness hindering overall balance.  Continue to goals.   PT Next Visit Plan *Check goals next visit, progress balance and gait to tolerance.   Consulted and Agree with Plan of Care Patient        Problem List Patient Active Problem List   Diagnosis Date Noted  . Gait disorder 09/05/2014  . Multiple myeloma   . Paraplegia 06/21/2014  . Postoperative anemia due to acute blood loss 06/21/2014  . 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 Spine metastasis 06/17/2014  . Thoracic spine tumor 06/17/2014  . Metastatic bone cancer 06/15/2014    Maritssa Haughton, PT 10/05/2014, 2:12 PM  [facility] [address] [place], [place], [address] Phone: [phone]   Fax:  [phone]

## 2014-10-05 NOTE — Progress Notes (Signed)
Radiation Oncology         [phone] ________________________________  Name: Jeremiah Owens MRN: [id number]  Date: 10/05/2014  DOB: 26-May-1930  Follow-Up Visit Note  CC: Wenda Low, MD  Rulon Eisenmenger, MD  Diagnosis:   Multiple myeloma  Interval Since Last Radiation:  One month   Narrative:  The patient returns today for routine follow-up.  The patient has been continuing with systemic treatment through medical oncology. He states that his blood work has indicated some progression of disease which has prompted a change in his systemic treatment. He is just in the process of getting started with this. He denies any difficulties in terms of recovering from surgery or radiation treatment. No pain in the back area.                              ALLERGIES:  is allergic to iodine.  Meds: Current Outpatient Prescriptions  Medication Sig Dispense Refill  . acyclovir (ZOVIRAX) 400 MG tablet Take 1 tablet (400 mg total) by mouth 2 (two) times daily. 60 tablet 2  . cholecalciferol (VITAMIN D) 1000 UNITS tablet Take 1,000 Units by mouth at bedtime.     Marland Kitchen dexamethasone (DECADRON) 4 MG tablet 10 tab every week, start with chemo 40 tablet 4  . hyaluronate sodium (RADIAPLEXRX) GEL Apply 1 application topically daily. Apply to treated area on skin after rad txs daily and prn    . HYDROcodone-acetaminophen (NORCO/VICODIN) 5-325 MG per tablet Take 1-2 tablets by mouth every 4 (four) hours as needed for moderate pain. 90 tablet 0  . HYDROCORTISONE, TOPICAL, 2.5 % SOLN Apply 1 application topically daily as needed.     Marland Kitchen lenalidomide (REVLIMID) 25 MG capsule Take 1 capsule (25 mg total) by mouth daily. Authorization number [id number] adult male 21 capsule 0  . levothyroxine (SYNTHROID, LEVOTHROID) 25 MCG tablet Take 1 tablet (25 mcg total) by mouth daily. 30 tablet 4  . methocarbamol (ROBAXIN) 500 MG tablet TAKE 1 TABLET (500 MG TOTAL) BY MOUTH EVERY 6 (SIX) HOURS AS NEEDED FOR MUSCLE SPASMS. 60  tablet 0  . Multiple Vitamins-Minerals (CENTRUM SILVER PO) Take 1 tablet by mouth daily.    Marland Kitchen omeprazole (PRILOSEC) 40 MG capsule Take 40 mg by mouth daily. In the afternoon  11  . Polyvinyl Alcohol-Povidone (REFRESH OP) Apply 1 drop to eye daily as needed (dry/irritated eyes).    . prochlorperazine (COMPAZINE) 10 MG tablet TAKE 1 TABLET BY MOUTH EVERY 6 HOURS AS NEEDED FOR NAUSEA AND VOMITING 30 tablet 0  . ranitidine (ZANTAC) 150 MG tablet Take 150 mg by mouth at bedtime.   11  . tamsulosin (FLOMAX) 0.4 MG CAPS capsule Take 1 capsule (0.4 mg total) by mouth at bedtime. 30 capsule 3  . warfarin (COUMADIN) 2 MG tablet Take 1 tablet (2 mg total) by mouth daily. 30 tablet 3   No current facility-administered medications for this encounter.    Physical Findings: The patient is in no acute distress. Patient is alert and oriented.  weight is 170 lb (77.111 kg). His oral temperature is 98.3 F (36.8 C). His blood pressure is 134/63 and his pulse is 71. His respiration is 12 and oxygen saturation is 100%. .     Lab Findings: Lab Results  Component Value Date   WBC 3.6* 10/04/2014   HGB 12.0* 10/04/2014   HCT 36.8* 10/04/2014   MCV 97.0 10/04/2014   PLT 136* 10/04/2014  Radiographic Findings: No results found.  Impression:    The patient clinically is doing satisfactorily with no ongoing issues in terms of radiation treatment.  Plan:  He will follow up with medical oncology with ongoing systemic treatment. She was comfortable and wish to follow-up in our clinic on a when necessary basis. He understands to call our clinic if we can be of any further assistance.   Jodelle Gross, M.D., Ph.D.

## 2014-10-09 ENCOUNTER — Ambulatory Visit: Payer: Medicare Other | Admitting: Occupational Therapy

## 2014-10-09 ENCOUNTER — Ambulatory Visit: Payer: Medicare Other

## 2014-10-09 DIAGNOSIS — C7951 Secondary malignant neoplasm of bone: Secondary | ICD-10-CM | POA: Diagnosis not present

## 2014-10-09 DIAGNOSIS — M2560 Stiffness of unspecified joint, not elsewhere classified: Secondary | ICD-10-CM | POA: Diagnosis not present

## 2014-10-09 DIAGNOSIS — R293 Abnormal posture: Secondary | ICD-10-CM

## 2014-10-09 DIAGNOSIS — Z7409 Other reduced mobility: Secondary | ICD-10-CM

## 2014-10-09 DIAGNOSIS — R6889 Other general symptoms and signs: Secondary | ICD-10-CM | POA: Diagnosis not present

## 2014-10-09 DIAGNOSIS — C9000 Multiple myeloma not having achieved remission: Secondary | ICD-10-CM | POA: Diagnosis not present

## 2014-10-09 DIAGNOSIS — R279 Unspecified lack of coordination: Secondary | ICD-10-CM

## 2014-10-09 DIAGNOSIS — IMO0002 Reserved for concepts with insufficient information to code with codable children: Secondary | ICD-10-CM

## 2014-10-09 DIAGNOSIS — M25511 Pain in right shoulder: Secondary | ICD-10-CM

## 2014-10-09 DIAGNOSIS — D492 Neoplasm of unspecified behavior of bone, soft tissue, and skin: Secondary | ICD-10-CM | POA: Diagnosis not present

## 2014-10-09 DIAGNOSIS — R262 Difficulty in walking, not elsewhere classified: Secondary | ICD-10-CM

## 2014-10-09 NOTE — Therapy (Signed)
[facility] [address] [place], [place], [address] Phone: [phone]   Fax:  [phone]  Physical Therapy Treatment  Patient Details  Name: Jeremiah Owens MRN: [id number] Date of Birth: July 03, 1930 Referring Provider:  , MD  Encounter Date: 10/09/2014      PT End of Session - 10/09/14 1030    Visit Number 9   Number of Visits 17   Date for PT Re-Evaluation 11/03/14   Authorization Type Medicare [employer] Primary, [employer] secondary G codes required   PT Start Time 0938   PT Stop Time 1017   PT Time Calculation (min) 39 min   Activity Tolerance Patient limited by fatigue;Patient limited by lethargy  likely due to new medication      Past Medical History  Diagnosis Date  . Compression fracture   . Melanoma   . Bone cancer     t_spine T-5  . Prostate cancer 2002  . Skin cancer 1994    melanoma  right neck   . Multiple myeloma   . Allergy   . Anxiety   . Thyroid disease   . S/P radiation therapy 08/21/14-09/04/14    T4-6 25Gy/52f    Past Surgical History  Procedure Laterality Date  . Hernia repair    . Posterior cervical fusion/foraminotomy N/A 06/17/2014    Procedure: Thoracic five laminectomy for epidural tumor resection Thoracic 4-7 posterior lateral arthrodesis, segmental pedicle screw fixation.;  Surgeon: KAshok Pall MD;  [facility];  Service: Neurosurgery;  Laterality: N/A;    There were no vitals taken for this visit.  Visit Diagnosis:  Difficulty walking  Decreased functional mobility and endurance  Lack of coordination      Subjective Assessment - 10/09/14 0943    Symptoms Pt is feeling exhausted due to new medication for cancer treatment "Revlimid"   Currently in Pain? Yes   Pain Score 1    Pain Location Shoulder   Pain Orientation Right   Pain Onset In the past 7 days  a new sharp pain began a few days ago   Aggravating Factors  moving right arm   Pain Relieving Factors  rest          [facility] - 10/09/14 0001    Assessment   Medical Diagnosis Metastatic Prostate Cancer   Standardized Balance Assessment   Standardized Balance Assessment Berg Balance Test;Timed Up and Go Test   Berg Balance Test   Sit to Stand Able to stand  independently using hands   Standing Unsupported Able to stand 2 minutes with supervision   Sitting with Back Unsupported but Feet Supported on Floor or Stool Able to sit safely and securely 2 minutes   Stand to Sit Controls descent by using hands   Transfers Able to transfer safely, definite need of hands   Standing Unsupported with Eyes Closed Able to stand 10 seconds with supervision   Standing Ubsupported with Feet Together Able to place feet together independently and stand for 1 minute with supervision   From Standing, Reach Forward with Outstretched Arm Can reach forward >12 cm safely (5")   From Standing Position, Pick up Object from Floor Able to pick up shoe, needs supervision   From Standing Position, Turn to Look Behind Over each Shoulder Turn sideways only but maintains balance   Turn 360 Degrees Needs close supervision or verbal cueing   Standing Unsupported, Alternately Place Feet on Step/Stool Able to complete >2 steps/needs minimal assist   Standing  Unsupported, One Foot in [place] to plae foot ahead of the other independently and hold 30 seconds   Standing on One Leg Tries to lift leg/unable to hold 3 seconds but remains standing independently   Total Score 36   Timed Up and Go Test   TUG Normal TUG   Normal TUG (seconds) 21.94      Neuro Re ED: TUG, Berg, sit to stands attempted without UE support but requires MIN A without UE support. Can perform independently with UE support. Noted to have anterior/posterior instability with sit to stand.  Gait training: With single point cane and CGA 2x30' with excessive knee flexion in stance phase on each leg, with decreased LLE foot clearance with  ambulation (weakness at hip and ankle). Practiced step-rock with LLE stepping forward and backward and with verbal cues and eventually MIN A to stabilize right knee in stance. Purpose increased LLE foot clearance in swing.                      PT Short Term Goals - 10/09/14 0946    PT SHORT TERM GOAL #1   Title Pt will demonstrate independence with HEP Target: 10/06/14   PT SHORT TERM GOAL #2   Title Pt will increase BERG score to 36/56 for improving balance.  Target: 10/06/14   Status Achieved   PT SHORT TERM GOAL #3   Title Pt will Decrease TUG time to 14.69 seconds for improving efficiency with functional mobility using a single point cane. Target 10/06/14   Status On-going  21.94 sec on 10/09/2014   PT SHORT TERM GOAL #4   Title Pt will ambulate independently on indoor level surfaces with cane.  Target: 10/06/14   Status Not Met  requires CGA and demonstrates inefficient gait pattern with cane   PT SHORT TERM GOAL #5   Title Pt will perform sit to stand without UE support, independently.   Target: 10/06/14   Status On-going           PT Long Term Goals - 09/04/14 1230    PT LONG TERM GOAL #1   Title Pt will increase Berg score to 49/56 for improved balance.  Target 11/03/14   PT LONG TERM GOAL #2   Title Pt will ambulate independently on indoor level surfaces without assistive device.  Target 11/03/14   PT LONG TERM GOAL #3   Title Pt will ambulate with suprervision on outdoor unlevel grass and concrete without assistive device. Target 11/03/14   PT LONG TERM GOAL #4   Title Pt will decrease TUG time to <13.5 seconds without assistive device for decreased fall risk and increased independence. Target 11/03/14   PT LONG TERM GOAL #5   Title Pt will increase gait speed to >2.63 ft/sec for independent community ambulator status. Target 11/03/14   Additional Long Term Goals   Additional Long Term Goals Yes   PT LONG TERM GOAL #6   Title Increase FOTO ABC to 30% for improved balance  conficdence.               Plan - 10/09/14 1031    Clinical Impression Statement Pt met Berg Balance Test goal, but demonstrates much slower movement likely due to side effects of his new cancer medication. Pt's speech is also slowed and he reports feeling fatigued. LLE foot clearance in gait may be more pronounced than before. Will continue to assess and determine if an AFO is needed.    PT  Next Visit Plan G code next visit. Check remaining goals, many goals unmet today likely due to medication side effect. Gait training with emphasis on increased knee extension in stance.         Problem List Patient Active Problem List   Diagnosis Date Noted  . Gait disorder 09/05/2014  . Multiple myeloma   . Paraplegia 06/21/2014  . Postoperative anemia due to acute blood loss 06/21/2014  . 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 Spine metastasis 06/17/2014  . Thoracic spine tumor 06/17/2014  . Metastatic bone cancer 06/15/2014   Delrae Sawyers, PT,DPT,NCS 10/09/2014 10:44 AM Phone [phone] FAX [phone]         [facility] [address] [place], [place], [address] Phone: [phone]   Fax:  [phone]

## 2014-10-09 NOTE — Therapy (Signed)
[facility] [address] [place], [place], [address] Phone: [phone]   Fax:  [phone]  Occupational Therapy Treatment  Patient Details  Name: Jeremiah Owens MRN: [id number] Date of Birth: 1930-07-21 Referring Provider:  , MD  Encounter Date: 10/09/2014      OT End of Session - 10/09/14 1123    Visit Number 9  9/10 G   Number of Visits 17   Date for OT Re-Evaluation 11/02/14   Authorization Type MCR - G CODE NEEDED   OT Start Time 1020   OT Stop Time 1100   OT Time Calculation (min) 40 min   Activity Tolerance Patient limited by pain      Past Medical History  Diagnosis Date  . Compression fracture   . Melanoma   . Bone cancer     t_spine T-5  . Prostate cancer 2002  . Skin cancer 1994    melanoma  right neck   . Multiple myeloma   . Allergy   . Anxiety   . Thyroid disease   . S/P radiation therapy 08/21/14-09/04/14    T4-6 25Gy/51f    Past Surgical History  Procedure Laterality Date  . Hernia repair    . Posterior cervical fusion/foraminotomy N/A 06/17/2014    Procedure: Thoracic five laminectomy for epidural tumor resection Thoracic 4-7 posterior lateral arthrodesis, segmental pedicle screw fixation.;  Surgeon: KAshok Pall MD;  [facility];  Service: Neurosurgery;  Laterality: N/A;    There were no vitals taken for this visit.  Visit Diagnosis:  Pain in joint, shoulder region, right  Posture abnormality  Stiffness of joint of upper extremity      Subjective Assessment - 10/09/14 1019    Symptoms I started a new chemo medicine (started Saturday night). I also had a sharp pain in my Rt shoulder a couple times (Friday night and Saturday night) BEFORE my new medicine, but I wasn't moving my arm, just sitting there.    Pertinent History active CA, LUE shoulder impairments premorbidly approx. 75%    Repetition Increases Symptoms   Patient Stated Goals relieve pain and improve reach  in right arm   Currently in Pain? Yes   Pain Score 2    Pain Location Shoulder   Pain Orientation Right   Pain Descriptors / Indicators Aching   Pain Onset 1 to 4 weeks ago   Pain Frequency Intermittent   Aggravating Factors  certain movements, malpositioning   Pain Relieving Factors rest, proper positioning                 OT Treatments/Exercises (OP) - 10/09/14 1117    Neurological Re-education Exercises   Other Exercises 1 Attempted standing with small ball at thoracic spine to encourage proper positioning during shoulder flexion but had to stop activity due to decreased balance. Pt then placed in chair with ball at thoracic spine to encourage proper positioning during AA/ROM in shoulder flexion. Pt with increased pain today during painful arc and therapist could not completely eliminate pain with manual facilitation and assist today. Pt noted to compensate into slight IR and abduction at approx 70-100* shoulder flexion, however higher range seems to be pain free (pain b/t 60-100*). Pt encouraged to make appointment with orthopedic MD to further assess shoulder. Pt agreed                  OT Short Term Goals - 10/02/14 1248    OT SHORT TERM GOAL #1  Title Independent w/ initial HEP for Lt shoulder (due 10/03/14)   Status Achieved   OT SHORT TERM GOAL #2   Title Pt to perform 10 min . of UE exercise w/o rest    Status Achieved   OT SHORT TERM GOAL #3   Title Pt to perform cooking task w/ supervision only using walker w/o LOB   Status Achieved           OT Long Term Goals - 10/02/14 1249    OT LONG TERM GOAL #1   Title Independent w/ updated HEP prn (due 11/02/14)   Status On-going   OT LONG TERM GOAL #2   Title Pt to perform dynamic standing tasks for 10 minutes or greater w/o LOB   Status On-going   OT LONG TERM GOAL #3   Title Pt/family to verbally id strategies for safety during IADLS    Status On-going               Plan - 10/09/14 1124     Clinical Impression Statement Pt with increased shoulder pain today. ? impingement at [facility] joint.    Plan G-CODE!, Prone - scapula strengthening and shoulder extension        Problem List Patient Active Problem List   Diagnosis Date Noted  . Gait disorder 09/05/2014  . Multiple myeloma   . Paraplegia 06/21/2014  . Postoperative anemia due to acute blood loss 06/21/2014  . 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 Spine metastasis 06/17/2014  . Thoracic spine tumor 06/17/2014  . Metastatic bone cancer 06/15/2014    Carey Bullocks, OTR/L 10/09/2014, 11:26 AM  [facility] [address] [place], [place], [address] Phone: [phone]   Fax:  [phone]

## 2014-10-11 ENCOUNTER — Ambulatory Visit (HOSPITAL_BASED_OUTPATIENT_CLINIC_OR_DEPARTMENT_OTHER): Payer: Medicare Other

## 2014-10-11 ENCOUNTER — Ambulatory Visit (HOSPITAL_BASED_OUTPATIENT_CLINIC_OR_DEPARTMENT_OTHER): Payer: Medicare Other | Admitting: Physician Assistant

## 2014-10-11 ENCOUNTER — Other Ambulatory Visit (HOSPITAL_BASED_OUTPATIENT_CLINIC_OR_DEPARTMENT_OTHER): Payer: Medicare Other

## 2014-10-11 ENCOUNTER — Encounter: Payer: Self-pay | Admitting: Physician Assistant

## 2014-10-11 VITALS — BP 129/58 | HR 93 | Temp 98.5°F | Resp 18 | Ht 69.0 in | Wt 173.2 lb

## 2014-10-11 DIAGNOSIS — C9000 Multiple myeloma not having achieved remission: Secondary | ICD-10-CM

## 2014-10-11 DIAGNOSIS — Z5112 Encounter for antineoplastic immunotherapy: Secondary | ICD-10-CM | POA: Diagnosis not present

## 2014-10-11 DIAGNOSIS — M25511 Pain in right shoulder: Secondary | ICD-10-CM | POA: Diagnosis not present

## 2014-10-11 DIAGNOSIS — C9002 Multiple myeloma in relapse: Secondary | ICD-10-CM

## 2014-10-11 LAB — COMPREHENSIVE METABOLIC PANEL (CC13)
ALBUMIN: 3.2 g/dL — AB (ref 3.5–5.0)
ALT: 16 U/L (ref 0–55)
AST: 18 U/L (ref 5–34)
Alkaline Phosphatase: 75 U/L (ref 40–150)
Anion Gap: 7 mEq/L (ref 3–11)
BILIRUBIN TOTAL: 0.88 mg/dL (ref 0.20–1.20)
BUN: 16.3 mg/dL (ref 7.0–26.0)
CO2: 24 mEq/L (ref 22–29)
CREATININE: 0.9 mg/dL (ref 0.7–1.3)
Calcium: 9.1 mg/dL (ref 8.4–10.4)
Chloride: 101 mEq/L (ref 98–109)
EGFR: 80 mL/min/{1.73_m2} — ABNORMAL LOW (ref >90)
GLUCOSE: 125 mg/dL (ref 70–140)
POTASSIUM: 4.2 meq/L (ref 3.5–5.1)
Sodium: 132 mEq/L — ABNORMAL LOW (ref 136–145)
TOTAL PROTEIN: 7.7 g/dL (ref 6.4–8.3)

## 2014-10-11 LAB — CBC WITH DIFFERENTIAL/PLATELET
BASO%: 0.2 % (ref 0.0–2.0)
BASOS ABS: 0 10*3/uL (ref 0.0–0.1)
EOS%: 0.6 % (ref 0.0–7.0)
Eosinophils Absolute: 0.1 10*3/uL (ref 0.0–0.5)
HCT: 34.9 % — ABNORMAL LOW (ref 38.4–49.9)
HGB: 11.3 g/dL — ABNORMAL LOW (ref 13.0–17.1)
LYMPH%: 2.7 % — ABNORMAL LOW (ref 14.0–49.0)
MCH: 31.4 pg (ref 27.2–33.4)
MCHC: 32.3 g/dL (ref 32.0–36.0)
MCV: 97.3 fL (ref 79.3–98.0)
MONO#: 0.4 10*3/uL (ref 0.1–0.9)
MONO%: 4.2 % (ref 0.0–14.0)
NEUT#: 7.9 10*3/uL — ABNORMAL HIGH (ref 1.5–6.5)
NEUT%: 92.3 % — ABNORMAL HIGH (ref 39.0–75.0)
PLATELETS: 126 10*3/uL — AB (ref 140–400)
RBC: 3.59 10*6/uL — ABNORMAL LOW (ref 4.20–5.82)
RDW: 14 % (ref 11.0–14.6)
WBC: 8.6 10*3/uL (ref 4.0–10.3)
lymph#: 0.2 10*3/uL — ABNORMAL LOW (ref 0.9–3.3)

## 2014-10-11 MED ORDER — BORTEZOMIB CHEMO SQ INJECTION 3.5 MG (2.5MG/ML)
1.3000 mg/m2 | Freq: Once | INTRAMUSCULAR | Status: AC
  Administered 2014-10-11: 2.5 mg via SUBCUTANEOUS
  Filled 2014-10-11: qty 2.5

## 2014-10-11 MED ORDER — ONDANSETRON HCL 8 MG PO TABS
ORAL_TABLET | ORAL | Status: AC
Start: 2014-10-11 — End: 2014-10-11
  Filled 2014-10-11: qty 1

## 2014-10-11 MED ORDER — HYDROCODONE-ACETAMINOPHEN 5-325 MG PO TABS: 1.0000 | ORAL_TABLET | Freq: Four times a day (QID) | ORAL | Status: DC | PRN

## 2014-10-11 MED ORDER — ONDANSETRON HCL 8 MG PO TABS
8.0000 mg | ORAL_TABLET | Freq: Once | ORAL | Status: AC
  Administered 2014-10-11: 8 mg via ORAL

## 2014-10-11 NOTE — Patient Instructions (Signed)
[facility] Discharge Instructions for Patients Receiving Chemotherapy  Today you received the following chemotherapy agents velcade   To help prevent nausea and vomiting after your treatment, we encourage you to take your nausea medication as directed  If you develop nausea and vomiting that is not controlled by your nausea medication, call the clinic.   BELOW ARE SYMPTOMS THAT SHOULD BE REPORTED IMMEDIATELY:  *FEVER GREATER THAN 100.5 F  *CHILLS WITH OR WITHOUT FEVER  NAUSEA AND VOMITING THAT IS NOT CONTROLLED WITH YOUR NAUSEA MEDICATION  *UNUSUAL SHORTNESS OF BREATH  *UNUSUAL BRUISING OR BLEEDING  TENDERNESS IN MOUTH AND THROAT WITH OR WITHOUT PRESENCE OF ULCERS  *URINARY PROBLEMS  *BOWEL PROBLEMS  UNUSUAL RASH Items with * indicate a potential emergency and should be followed up as soon as possible.  Feel free to call the clinic you have any questions or concerns. The clinic phone number is [phone].  

## 2014-10-11 NOTE — Progress Notes (Addendum)
[place] [phone]   [phone]) [employer], [address] [place] [address]  DIAGNOSIS: Multiple myeloma diagnosed in November 2015  PRIOR THERAPY:  1. Status post Thoracic five laminectomy for epidural tumor resection, Thoracic 4-7 posterior lateral arthrodesis,  segmental pedicle screw fixation T4-T7 Globus instrumentation under the care of Dr. Christella Noa on 06/18/2014.  Systemic chemotherapy with Velcade 1.3 MG/M2 subcutaneously weekly in addition to Decadron 40 mg by mouth on weekly basis. Status post 11 cycles.  CURRENT THERAPY: Systemic chemotherapy with Velcade 1.3 MG/M2 subcutaneously weekly in addition to Decadron 40 mg by mouth on weekly basis. Status post 11 cycles. Revlimid was added after cycle #11 with therapy beginning 10/07/2014. The Revlimid dose is 25 mg by mouth daily taken for 21 days and then off for 7 days. Coumadin 2 mg by mouth daily  INTERVAL HISTORY: Jeremiah Owens 81 y.o. male returns to the clinic today for follow-up visit accompanied by his wife. The patient is currently on treatment with weekly subcutaneous Velcade and Decadron status post 11 cycles and tolerating the treatment fairly well. The patient was seen on 09/27/2014 his protein studies showed evidence for disease progression and he was started on Revlimid at 25 mg by mouth daily for 21 days with 7 days off in addition to load prophylactic dose Coumadin at 2 mg by mouth daily. Overall he is tolerating this combination of therapy relatively well. He did have some mild diarrhea, 2-3 episodes a day for a couple days. This has since resolved. He continues to shoulder pain and requests a refill for his Norco 5/325 mg tablets. He does report some constipation that he is currently managing with a combination of prune juice and eating dried prunes as well. He now feels that this combination may have been contributing to his  diarrhea and he has backed off of this combination of prune juice and dried presence. He does complain of some fatigue. He denied having any significant nausea or vomiting, no fever or chills. He denied having any significant weight loss or night sweats.   He continues to have some weakness in the left lower extremity and he will begin some neuro physical therapy on an outpatient basis soon. He denied having any significant chest pain, shortness breath, cough or hemoptysis. He is here today to start cycle #12 of his weekly systemic therapy.   MEDICAL HISTORY: Past Medical History  Diagnosis Date  . Compression fracture   . Melanoma   . Bone cancer     t_spine T-5  . Prostate cancer 2002  . Skin cancer 1994    melanoma  right neck   . Multiple myeloma   . Allergy   . Anxiety   . Thyroid disease   . S/P radiation therapy 08/21/14-09/04/14    T4-6 25Gy/74f    ALLERGIES:  is allergic to iodine.  MEDICATIONS:  Current Outpatient Prescriptions  Medication Sig Dispense Refill  . acyclovir (ZOVIRAX) 400 MG tablet TAKE 1 TABLET BY MOUTH TWICE A DAY 60 tablet 2  . cholecalciferol (VITAMIN D) 1000 UNITS tablet Take 1,000 Units by mouth at bedtime.     .Marland Kitchendexamethasone (DECADRON) 4 MG tablet 10 tab every week, start with chemo 40 tablet 4  . hyaluronate sodium (RADIAPLEXRX) GEL Apply 1 application topically daily. Apply to treated area on skin after rad txs daily and prn    . HYDROcodone-acetaminophen (NORCO/VICODIN) 5-325 MG per tablet  Take 1-2 tablets by mouth every 4 (four) hours as needed for moderate pain. 90 tablet 0  . HYDROCORTISONE, TOPICAL, 2.5 % SOLN Apply 1 application topically daily as needed.     Marland Kitchen lenalidomide (REVLIMID) 25 MG capsule Take 1 capsule (25 mg total) by mouth daily. Authorization number [id number] adult male 21 capsule 0  . levothyroxine (SYNTHROID, LEVOTHROID) 25 MCG tablet Take 1 tablet (25 mcg total) by mouth daily. 30 tablet 4  . methocarbamol (ROBAXIN) 500 MG  tablet TAKE 1 TABLET (500 MG TOTAL) BY MOUTH EVERY 6 (SIX) HOURS AS NEEDED FOR MUSCLE SPASMS. 60 tablet 0  . Multiple Vitamins-Minerals (CENTRUM SILVER PO) Take 1 tablet by mouth daily.    Marland Kitchen omeprazole (PRILOSEC) 40 MG capsule Take 40 mg by mouth daily. In the afternoon  11  . Polyvinyl Alcohol-Povidone (REFRESH OP) Apply 1 drop to eye daily as needed (dry/irritated eyes).    . prochlorperazine (COMPAZINE) 10 MG tablet TAKE 1 TABLET BY MOUTH EVERY 6 HOURS AS NEEDED FOR NAUSEA AND VOMITING 30 tablet 0  . ranitidine (ZANTAC) 150 MG tablet Take 150 mg by mouth at bedtime.   11  . tamsulosin (FLOMAX) 0.4 MG CAPS capsule Take 1 capsule (0.4 mg total) by mouth at bedtime. 30 capsule 3  . warfarin (COUMADIN) 2 MG tablet Take 1 tablet (2 mg total) by mouth daily. 30 tablet 3  . HYDROcodone-acetaminophen (NORCO) 5-325 MG per tablet Take 1 tablet by mouth every 6 (six) hours as needed for moderate pain. 60 tablet 0   No current facility-administered medications for this visit.    SURGICAL HISTORY:  Past Surgical History  Procedure Laterality Date  . Hernia repair    . Posterior cervical fusion/foraminotomy N/A 06/17/2014    Procedure: Thoracic five laminectomy for epidural tumor resection Thoracic 4-7 posterior lateral arthrodesis, segmental pedicle screw fixation.;  Surgeon: Ashok Pall, MD;  Location: [facility];  Service: Neurosurgery;  Laterality: N/A;    REVIEW OF SYSTEMS:  Constitutional: positive for fatigue Eyes: negative Ears, nose, mouth, throat, and face: negative Respiratory: negative Cardiovascular: negative Gastrointestinal: positive for diarrhea Genitourinary:negative Integument/breast: negative Hematologic/lymphatic: negative Musculoskeletal:positive for arthralgias, bone pain and muscle weakness Neurological: positive for weakness Behavioral/Psych: negative Endocrine: negative Allergic/Immunologic: negative   PHYSICAL EXAMINATION: General appearance: alert, cooperative,  fatigued and no distress Head: Normocephalic, without obvious abnormality, atraumatic Neck: no adenopathy, no JVD, supple, symmetrical, trachea midline and thyroid not enlarged, symmetric, no tenderness/mass/nodules Lymph nodes: Cervical, supraclavicular, and axillary nodes normal. Resp: clear to auscultation bilaterally Back: symmetric, no curvature. ROM normal. No CVA tenderness. Cardio: regular rate and rhythm, S1, S2 normal, no murmur, click, rub or gallop GI: soft, non-tender; bowel sounds normal; no masses,  no organomegaly Extremities: extremities normal, atraumatic, no cyanosis or edema Neurologic: Motor: Weakness in the left foot  ECOG PERFORMANCE STATUS: 1 - Symptomatic but completely ambulatory  Blood pressure 129/58, pulse 93, temperature 98.5 F (36.9 C), temperature source Oral, resp. rate 18, height _0  (1.753 m), weight 173 lb 3.2 oz (78.563 kg).  LABORATORY DATA: Lab Results  Component Value Date   WBC 8.6 10/11/2014   HGB 11.3* 10/11/2014   HCT 34.9* 10/11/2014   MCV 97.3 10/11/2014   PLT 126* 10/11/2014      Chemistry      Component Value Date/Time   NA 132* 10/11/2014 1146   NA 133* 07/04/2014 0430   K 4.2 10/11/2014 1146   K 4.1 07/04/2014 0430   CL 97 07/04/2014 0430  CO2 24 10/11/2014 1146   CO2 27 07/04/2014 0430   BUN 16.3 10/11/2014 1146   BUN 18 07/04/2014 0430   CREATININE 0.9 10/11/2014 1146   CREATININE 0.90 07/04/2014 0430      Component Value Date/Time   CALCIUM 9.1 10/11/2014 1146   CALCIUM 8.9 07/04/2014 0430   ALKPHOS 75 10/11/2014 1146   ALKPHOS 47 06/21/2014 0406   AST 18 10/11/2014 1146   AST 31 06/21/2014 0406   ALT 16 10/11/2014 1146   ALT 29 06/21/2014 0406   BILITOT 0.88 10/11/2014 1146   BILITOT <0.2* 06/21/2014 0406       RADIOGRAPHIC STUDIES: No results found.  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 He is currently on treatment with subcutaneous Velcade and Decadron status post 10 weekly doses. The patient is tolerating his treatment fairly well with no significant adverse effects. Patient was discussed with and also seen by Dr. Julien Nordmann. He will continue on his current treatment with weekly Velcade and dexamethasone. He is recent protein studies showed evidence for disease progression. He started Revlimid 25 mg by mouth daily on 10/07/2014. He will take this for 21 days and then off for 7 days. In addition he is also on Coumadin 2 mg by mouth daily as a prophylactic dose. Overall is tolerating this combination of treatment relatively well. He did have some mild diarrhea although some of this may be related to his dietary intake. We will continue to monitor these symptoms on subsequent visits. He will continue with his treatment with weekly subcutaneous Velcade and dexamethasone in addition to the Revlimid and Coumadin, We will add Revlimid to his Velcade and dexamethasone. He will take Revlimid 25 mg by mouth daily for 3 weeks and off for one week. He will also be started on prophylactic dose Coumadin at 2 mg by mouth daily. We asked him to see his dentist to get dental clearance to be started on Zometa therapy. This is pending. Patient will follow-up in 2 weeks for another symptom management visit  He will receive cycle #12 today.   He was advised to call immediately if he has any concerning symptoms in the interval. The patient voices understanding of current disease status and treatment options and is in agreement with the current care plan.  All questions were answered. The patient knows to call the clinic with any problems, questions or concerns. We can certainly see the patient much sooner if necessary.  Carlton Adam, PA-C 10/11/2014  ADDENDUM: Hematology/Oncology Attending: I had a face to face encounter with the patient. I  recommended his care plan. This is a very pleasant 81 years old white male recently diagnosed with multiple myeloma and currently undergoing systemic chemotherapy with Velcade subcutaneously in addition to Revlimid and Decadron status post 1 cycle. He tolerated the first cycle of his treatment fairly well with no significant adverse effects except for mild diarrhea which resolved spontaneously after discontinuing some of his stool softeners. I recommended for the patient to proceed with his treatment with Velcade, Revlimid and Decadron as scheduled. He would come back for follow-up visit in 2 weeks for reevaluation and management of any adverse effect of his treatment before starting the next cycle of his chemotherapy. We will consider starting the patient on treatment with Zometa once we receive dental clearance. He was advised to  call immediately if he has any concerning symptoms in the interval.  Disclaimer: This note was dictated with voice recognition software. Similar sounding words can inadvertently be transcribed and may be missed upon review. Eilleen Kempf., MD 10/15/2014

## 2014-10-12 ENCOUNTER — Ambulatory Visit: Payer: Medicare Other

## 2014-10-12 ENCOUNTER — Ambulatory Visit: Payer: Medicare Other | Admitting: Occupational Therapy

## 2014-10-12 ENCOUNTER — Other Ambulatory Visit: Payer: Self-pay | Admitting: Neurosurgery

## 2014-10-12 ENCOUNTER — Encounter: Payer: Self-pay | Admitting: Occupational Therapy

## 2014-10-12 DIAGNOSIS — C7951 Secondary malignant neoplasm of bone: Secondary | ICD-10-CM | POA: Diagnosis not present

## 2014-10-12 DIAGNOSIS — C9000 Multiple myeloma not having achieved remission: Secondary | ICD-10-CM

## 2014-10-12 DIAGNOSIS — R6889 Other general symptoms and signs: Secondary | ICD-10-CM | POA: Diagnosis not present

## 2014-10-12 DIAGNOSIS — M2560 Stiffness of unspecified joint, not elsewhere classified: Secondary | ICD-10-CM | POA: Diagnosis not present

## 2014-10-12 DIAGNOSIS — D492 Neoplasm of unspecified behavior of bone, soft tissue, and skin: Secondary | ICD-10-CM | POA: Diagnosis not present

## 2014-10-12 DIAGNOSIS — R279 Unspecified lack of coordination: Secondary | ICD-10-CM

## 2014-10-12 DIAGNOSIS — Z7409 Other reduced mobility: Secondary | ICD-10-CM

## 2014-10-12 DIAGNOSIS — R262 Difficulty in walking, not elsewhere classified: Secondary | ICD-10-CM

## 2014-10-12 DIAGNOSIS — R29898 Other symptoms and signs involving the musculoskeletal system: Secondary | ICD-10-CM

## 2014-10-12 NOTE — Therapy (Signed)
[facility] [address] [place], [place], [address] Phone: [phone]   Fax:  [phone]  Physical Therapy Treatment  Patient Details  Name: Jeremiah Owens MRN: [id number] Date of Birth: [birth year] Referring Provider:  Wenda Low, MD  Encounter Date: 10/12/2014      PT End of Session - 10/13/14 0910    Visit Number 10   Number of Visits 17   Date for PT Re-Evaluation 11/03/14   Authorization Type Medicare [employer], [employer] secondary G codes required   PT Start Time 0806   PT Stop Time 0845   PT Time Calculation (min) 39 min      Past Medical History  Diagnosis Date  . Compression fracture   . Melanoma   . Bone cancer     t_spine T-5  . Prostate cancer 2002  . Skin cancer 1994    melanoma  right neck   . Multiple myeloma   . Allergy   . Anxiety   . Thyroid disease   . S/P radiation therapy 08/21/14-09/04/14    T4-6 25Gy/82f    Past Surgical History  Procedure Laterality Date  . Hernia repair    . Posterior cervical fusion/foraminotomy N/A 06/17/2014    Procedure: Thoracic five laminectomy for epidural tumor resection Thoracic 4-7 posterior lateral arthrodesis, segmental pedicle screw fixation.;  Surgeon: KAshok Pall MD;  [facility];  Service: Neurosurgery;  Laterality: N/A;    There were no vitals taken for this visit.  Visit Diagnosis:  Difficulty walking  Decreased functional mobility and endurance  Lack of coordination  Weakness of left leg      Subjective Assessment - 10/12/14 0809    Symptoms Pt had a diarrhea episode this morning before therapy and is feeling fatigued after multipl appointmnts ysterday.   Currently in Pain? No/denies   Pain Score 0-No pain     Gait training x100' and lateral x200' with single point cane and CGA with notable trendelenburg pattern.  Thorough discussion of fluctuation in functional abilities due to chemo, how this affects  assistive device selection, and how we determine whether or not to continue PT services based on need for progression versus maintenance.   Gluteus medius strength:  pidgeon toe side stepping 3 laps in parallel bars Single leg stance on compliant mat with leg side kick 3x10 each leg  TUG 18.15 seconds.       [facility] - 10/13[facility]14 0001    Timed Up and Go Test   TUG Normal TUG   Normal TUG (seconds) 18.15       *frequent rest breaks required due to pt's fatigue on chemo                     PT Short Term Goals - 10/12/14 0813    PT SHORT TERM GOAL #1   Title Pt will demonstrate independence with HEP Target: 10/06/14   Status Achieved   PT SHORT TERM GOAL #2   Title Pt will increase BERG score to 36/56 for improving balance.  Target: 10/06/14   Status Achieved   PT SHORT TERM GOAL #3   Title Pt will Decrease TUG time to 14.69 seconds for improving efficiency with functional mobility using a single point cane. Target 10/06/14   Status Not Met  18.75 on 10/12/14   PT SHORT TERM GOAL #4   Title Pt will ambulate independently on indoor level surfaces with cane.  Target: 10/06/14   Status  Not Met  requires CGA and demonstrates inefficient gait pattern with cane   PT SHORT TERM GOAL #5   Title Pt will perform sit to stand without UE support, independently.   Target: 10/06/14   Status Not Met           PT Long Term Goals - 09/04/14 1230    PT LONG TERM GOAL #1   Title Pt will increase Berg score to 49/56 for improved balance.  Target 11/03/14   PT LONG TERM GOAL #2   Title Pt will ambulate independently on indoor level surfaces without assistive device.  Target 11/03/14   PT LONG TERM GOAL #3   Title Pt will ambulate with suprervision on outdoor unlevel grass and concrete without assistive device. Target 11/03/14   PT LONG TERM GOAL #4   Title Pt will decrease TUG time to <13.5 seconds without assistive device for decreased fall risk and increased independence. Target  11/03/14   PT LONG TERM GOAL #5   Title Pt will increase gait speed to >2.63 ft/sec for independent community ambulator status. Target 11/03/14   Additional Long Term Goals   Additional Long Term Goals Yes   PT LONG TERM GOAL #6   Title Increase FOTO ABC to 30% for improved balance conficdence.               Plan - 10/13/14 0911    Clinical Impression Statement Pt did better than previous session this week but continues to be limited due to recent start on Chemo. Continue per plan of care to determine if continued progress is likely.   PT Next Visit Plan Gait training and single limb stance activities, glut med strengthening, spinal extensor strength, sit to stand training from progressively lowering mat   Consulted and Agree with Plan of Care Patient          G-Codes - 2014-10-16 0914    Functional Assessment Tool Used Merrilee Jansky 36/56   Functional Limitation Mobility: Walking and moving around   Mobility: Walking and Moving Around Current Status [phone]) At least 20 percent but less than 40 percent impaired, limited or restricted   Mobility: Walking and Moving Around Goal Status [phone]) At least 1 percent but less than 20 percent impaired, limited or restricted      Problem List Patient Active Problem List   Diagnosis Date Noted  . Gait disorder 09/05/2014  . Multiple myeloma   . Paraplegia 06/21/2014  . Postoperative anemia due to acute blood loss 06/21/2014  . 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 Spine metastasis 06/17/2014  . Thoracic spine tumor 06/17/2014  . Metastatic bone cancer 06/15/2014    Delrae Sawyers D 10/13/2014, 9:16 AM  [facility] [address] [place], [place], [address] Phone: [phone]   Fax:  [phone]

## 2014-10-12 NOTE — Therapy (Signed)
[facility] [address] [place], [place], [address] Phone: [phone]   Fax:  [phone]  Occupational Therapy Treatment  Patient Details  Name: Jeremiah Owens MRN: [id number] Date of Birth: 02/29/1930 Referring Provider:  Wenda Low, MD  Encounter Date: 10/12/2014      OT End of Session - 10/12/14 1201    Visit Number 10   Number of Visits 17   Date for OT Re-Evaluation 11/02/14   Authorization Type MCR - G CODE NEEDED   OT Start Time 0845   OT Stop Time 0928   OT Time Calculation (min) 43 min   Activity Tolerance Patient tolerated treatment well      Past Medical History  Diagnosis Date  . Compression fracture   . Melanoma   . Bone cancer     t_spine T-5  . Prostate cancer 2002  . Skin cancer 1994    melanoma  right neck   . Multiple myeloma   . Allergy   . Anxiety   . Thyroid disease   . S/P radiation therapy 08/21/14-09/04/14    T4-6 25Gy/38f    Past Surgical History  Procedure Laterality Date  . Hernia repair    . Posterior cervical fusion/foraminotomy N/A 06/17/2014    Procedure: Thoracic five laminectomy for epidural tumor resection Thoracic 4-7 posterior lateral arthrodesis, segmental pedicle screw fixation.;  Surgeon: KAshok Pall MD;  [facility];  Service: Neurosurgery;  Laterality: N/A;    There were no vitals taken for this visit.  Visit Diagnosis:  Decreased functional mobility and endurance      Subjective Assessment - 10/12/14 1155    Symptoms I am supposed to have an MRI hopefully next Monday to see if the cancer is in my shoulder   Pertinent History active CA, LUE shoulder impairments premorbidly approx. 75%    Currently in Pain? Yes   Pain Score 6    Pain Location Shoulder   Pain Orientation Right  with reach   Pain Descriptors / Indicators Sharp   Pain Type Acute pain   Pain Onset 1 to 4 weeks ago   Pain Frequency Intermittent   Aggravating Factors   reaching mid to high,    Pain Relieving Factors pain meds, not reaching with with arm                 OT Treatments/Exercises (OP) - 10/12/14 0001    ADLs   Functional Mobility Addressed balance, functional mobility with walker and activity tolerance.Pt able to participated in approximately 20 minutes of continous low level activity at ambulatory level with walker with no LOB. Pt with significant shoulder pain and was seen by orthopedic MD. XOdette Hornsshowed thinning of the humerus per patient. Pt to have MRI next week to R/O cancer in shoulder. Pt on hold for OT for one week awaitng results.                  OT Short Term Goals - 10/12/14 1204    OT SHORT TERM GOAL #1   Title Independent w/ initial HEP for Lt shoulder (due 10/03/14)   Status Achieved   OT SHORT TERM GOAL #2   Title Pt to perform 10 min . of UE exercise w/o rest    Status Achieved   OT SHORT TERM GOAL #3   Title Pt to perform cooking task w/ supervision only using walker w/o LOB   Status Achieved  OT Long Term Goals - 2014/10/26 1204    OT LONG TERM GOAL #1   Title Independent w/ updated HEP prn (due 11/02/14)   Status On-going   OT LONG TERM GOAL #2   Title Pt to perform dynamic standing tasks for 10 minutes or greater w/o LOB   Status Achieved   OT LONG TERM GOAL #3   Title Pt/family to verbally id strategies for safety during IADLS    Status Achieved               Plan - 10-26-14 09-23-00    Clinical Impression Statement Pt making progress with functional mobility however MD now trying to R/O possible cancer in R shoulder. Pt to have MRI next week - on hold for OT for one week awaiting results   Pt will benefit from skilled therapeutic intervention in order to improve on the following deficits (Retired) Decreased activity tolerance;Decreased balance;Decreased endurance;Decreased range of motion;Decreased strength;Impaired UE functional use;Improper body mechanics;Impaired tone   Rehab  Potential Good   OT Frequency 2x / week   OT Duration 8 weeks   OT Treatment/Interventions Self-care/ADL training;Therapeutic exercise;Moist Heat;Neuromuscular education;Functional Mobility Training;Energy conservation;Patient/family education;DME and/or AE instruction;Passive range of motion;Therapeutic activities   Plan pt on hold for one week awating results of MRI - once results known will plan from there.   Consulted and Agree with Plan of Care Patient          G-Codes - 10/26/14 23-Sep-1204    Mobility: Walking and Moving Around Current Status [phone]) At least 20 percent but less than 40 percent impaired, limited or restricted   Mobility: Walking and Moving Around Goal Status [phone]) At least 1 percent but less than 20 percent impaired, limited or restricted      Problem List Patient Active Problem List   Diagnosis Date Noted  . Gait disorder 09/05/2014  . Multiple myeloma   . Paraplegia 06/21/2014  . Postoperative anemia due to acute blood loss 06/21/2014  . 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 Spine metastasis 06/17/2014  . Thoracic spine tumor 06/17/2014  . Metastatic bone cancer 06/15/2014    Quay Burow, OTR/L 10/26/14, 12:06 PM  [facility] [address] [place], [place], [address] Phone: [phone]   Fax:  [phone]

## 2014-10-13 NOTE — Patient Instructions (Signed)
Continue taking the dexamethasone, Revlimid and Coumadin as prescribed Continue labs and chemotherapy as scheduled Follow-up in 2 weeks

## 2014-10-16 ENCOUNTER — Ambulatory Visit: Payer: Medicare Other | Admitting: Occupational Therapy

## 2014-10-16 ENCOUNTER — Ambulatory Visit: Payer: Medicare Other

## 2014-10-17 ENCOUNTER — Ambulatory Visit
Admission: RE | Admit: 2014-10-17 | Discharge: 2014-10-17 | Disposition: A | Discharge status: Home / Self Care | Payer: Medicare Other | Source: Ambulatory Visit | Attending: Neurosurgery | Admitting: Neurosurgery

## 2014-10-17 DIAGNOSIS — M47814 Spondylosis without myelopathy or radiculopathy, thoracic region: Secondary | ICD-10-CM | POA: Diagnosis not present

## 2014-10-17 DIAGNOSIS — M4322 Fusion of spine, cervical region: Secondary | ICD-10-CM | POA: Diagnosis not present

## 2014-10-17 DIAGNOSIS — M4324 Fusion of spine, thoracic region: Secondary | ICD-10-CM | POA: Diagnosis not present

## 2014-10-17 DIAGNOSIS — C9000 Multiple myeloma not having achieved remission: Secondary | ICD-10-CM | POA: Diagnosis not present

## 2014-10-17 DIAGNOSIS — M47812 Spondylosis without myelopathy or radiculopathy, cervical region: Secondary | ICD-10-CM | POA: Diagnosis not present

## 2014-10-18 ENCOUNTER — Other Ambulatory Visit (HOSPITAL_BASED_OUTPATIENT_CLINIC_OR_DEPARTMENT_OTHER): Payer: Medicare Other

## 2014-10-18 ENCOUNTER — Other Ambulatory Visit: Payer: Self-pay | Admitting: Internal Medicine

## 2014-10-18 ENCOUNTER — Ambulatory Visit (HOSPITAL_BASED_OUTPATIENT_CLINIC_OR_DEPARTMENT_OTHER): Payer: Medicare Other

## 2014-10-18 DIAGNOSIS — Z5112 Encounter for antineoplastic immunotherapy: Secondary | ICD-10-CM | POA: Diagnosis not present

## 2014-10-18 DIAGNOSIS — C9002 Multiple myeloma in relapse: Secondary | ICD-10-CM

## 2014-10-18 DIAGNOSIS — C9000 Multiple myeloma not having achieved remission: Secondary | ICD-10-CM | POA: Diagnosis not present

## 2014-10-18 LAB — CBC WITH DIFFERENTIAL/PLATELET
BASO%: 0.2 % (ref 0.0–2.0)
Basophils Absolute: 0 10*3/uL (ref 0.0–0.1)
EOS ABS: 0.3 10*3/uL (ref 0.0–0.5)
EOS%: 5.8 % (ref 0.0–7.0)
HCT: 34.5 % — ABNORMAL LOW (ref 38.4–49.9)
HGB: 11.7 g/dL — ABNORMAL LOW (ref 13.0–17.1)
LYMPH%: 8.7 % — ABNORMAL LOW (ref 14.0–49.0)
MCH: 32.2 pg (ref 27.2–33.4)
MCHC: 33.9 g/dL (ref 32.0–36.0)
MCV: 95 fL (ref 79.3–98.0)
MONO#: 0.8 10*3/uL (ref 0.1–0.9)
MONO%: 16.4 % — AB (ref 0.0–14.0)
NEUT%: 68.9 % (ref 39.0–75.0)
NEUTROS ABS: 3.3 10*3/uL (ref 1.5–6.5)
Platelets: 118 10*3/uL — ABNORMAL LOW (ref 140–400)
RBC: 3.63 10*6/uL — AB (ref 4.20–5.82)
RDW: 14 % (ref 11.0–14.6)
WBC: 4.8 10*3/uL (ref 4.0–10.3)
lymph#: 0.4 10*3/uL — ABNORMAL LOW (ref 0.9–3.3)

## 2014-10-18 LAB — COMPREHENSIVE METABOLIC PANEL (CC13)
ALT: 28 U/L (ref 0–55)
AST: 21 U/L (ref 5–34)
Albumin: 3 g/dL — ABNORMAL LOW (ref 3.5–5.0)
Alkaline Phosphatase: 79 U/L (ref 40–150)
Anion Gap: 6 mEq/L (ref 3–11)
BUN: 15.9 mg/dL (ref 7.0–26.0)
CO2: 28 mEq/L (ref 22–29)
Calcium: 8.5 mg/dL (ref 8.4–10.4)
Chloride: 104 mEq/L (ref 98–109)
Creatinine: 0.9 mg/dL (ref 0.7–1.3)
EGFR: 80 mL/min/{1.73_m2} — ABNORMAL LOW (ref >90)
Glucose: 94 mg/dl (ref 70–140)
Potassium: 4 mEq/L (ref 3.5–5.1)
Sodium: 138 mEq/L (ref 136–145)
Total Bilirubin: 0.37 mg/dL (ref 0.20–1.20)
Total Protein: 6.4 g/dL (ref 6.4–8.3)

## 2014-10-18 LAB — TECHNOLOGIST REVIEW

## 2014-10-18 MED ORDER — ONDANSETRON HCL 8 MG PO TABS
8.0000 mg | ORAL_TABLET | Freq: Once | ORAL | Status: AC
  Administered 2014-10-18: 8 mg via ORAL

## 2014-10-18 MED ORDER — BORTEZOMIB CHEMO SQ INJECTION 3.5 MG (2.5MG/ML)
1.3000 mg/m2 | Freq: Once | INTRAMUSCULAR | Status: AC
  Administered 2014-10-18: 2.5 mg via SUBCUTANEOUS
  Filled 2014-10-18: qty 2.5

## 2014-10-18 MED ORDER — ONDANSETRON HCL 8 MG PO TABS
ORAL_TABLET | ORAL | Status: AC
  Filled 2014-10-18: qty 1

## 2014-10-18 NOTE — Patient Instructions (Signed)
[facility] Discharge Instructions for Patients Receiving Chemotherapy  Today you received the following chemotherapy agents; Velcade.   To help prevent nausea and vomiting after your treatment, we encourage you to take your nausea medication as directed.    If you develop nausea and vomiting that is not controlled by your nausea medication, call the clinic.   BELOW ARE SYMPTOMS THAT SHOULD BE REPORTED IMMEDIATELY:  *FEVER GREATER THAN 100.5 F  *CHILLS WITH OR WITHOUT FEVER  NAUSEA AND VOMITING THAT IS NOT CONTROLLED WITH YOUR NAUSEA MEDICATION  *UNUSUAL SHORTNESS OF BREATH  *UNUSUAL BRUISING OR BLEEDING  TENDERNESS IN MOUTH AND THROAT WITH OR WITHOUT PRESENCE OF ULCERS  *URINARY PROBLEMS  *BOWEL PROBLEMS  UNUSUAL RASH Items with * indicate a potential emergency and should be followed up as soon as possible.  Feel free to call the clinic you have any questions or concerns. The clinic phone number is [phone].  Please show the CHEMO ALERT CARD at check to the Emergency Department and triage nurse.   

## 2014-10-19 ENCOUNTER — Ambulatory Visit: Payer: Medicare Other

## 2014-10-19 DIAGNOSIS — D492 Neoplasm of unspecified behavior of bone, soft tissue, and skin: Secondary | ICD-10-CM | POA: Diagnosis not present

## 2014-10-19 DIAGNOSIS — R293 Abnormal posture: Secondary | ICD-10-CM

## 2014-10-19 DIAGNOSIS — R6889 Other general symptoms and signs: Secondary | ICD-10-CM

## 2014-10-19 DIAGNOSIS — C7951 Secondary malignant neoplasm of bone: Secondary | ICD-10-CM | POA: Diagnosis not present

## 2014-10-19 DIAGNOSIS — M2560 Stiffness of unspecified joint, not elsewhere classified: Secondary | ICD-10-CM | POA: Diagnosis not present

## 2014-10-19 DIAGNOSIS — Z7409 Other reduced mobility: Secondary | ICD-10-CM | POA: Diagnosis not present

## 2014-10-19 DIAGNOSIS — C9000 Multiple myeloma not having achieved remission: Secondary | ICD-10-CM | POA: Diagnosis not present

## 2014-10-19 DIAGNOSIS — R29898 Other symptoms and signs involving the musculoskeletal system: Secondary | ICD-10-CM

## 2014-10-19 NOTE — Therapy (Signed)
[facility] [address] [place], [place], [address] Phone: [phone]   Fax:  [phone]  Physical Therapy Treatment  Patient Details  Name: Jeremiah Owens MRN: [id number] Date of Birth: Jul 23, 1930 Referring Provider:  Wenda Low, MD  Encounter Date: 10/19/2014      PT End of Session - 10/19/14 1305    Visit Number 11   Number of Visits 17   Date for PT Re-Evaluation 11/03/14   Authorization Type Medicare [employer] Primary, [employer] secondary G codes required   PT Start Time 0934   PT Stop Time 1015   PT Time Calculation (min) 41 min      Past Medical History  Diagnosis Date  . Compression fracture   . Melanoma   . Bone cancer     t_spine T-5  . Prostate cancer 2002  . Skin cancer 1994    melanoma  right neck   . Multiple myeloma   . Allergy   . Anxiety   . Thyroid disease   . S/P radiation therapy 08/21/14-09/04/14    T4-6 25Gy/74f    Past Surgical History  Procedure Laterality Date  . Hernia repair    . Posterior cervical fusion/foraminotomy N/A 06/17/2014    Procedure: Thoracic five laminectomy for epidural tumor resection Thoracic 4-7 posterior lateral arthrodesis, segmental pedicle screw fixation.;  Surgeon: KAshok Pall MD;  [facility];  Service: Neurosurgery;  Laterality: N/A;    There were no vitals filed for this visit.  Visit Diagnosis:  Weakness of left leg  Posture abnormality  Activity intolerance      Subjective Assessment - 10/19/14 0936    Symptoms Pt reports the right shoulder blade pain is a little bit better. He just had imaging on the right shoulder to see if cancer spread there. Waiting on results. Feeling a little stronger today.   Currently in Pain? No/denies     Therex: Hamstring stretching in short sitting 3x30 seconds each leg  Gluteus stretch in sitting 3x30 seconds each leg  Prone press up holds 3x30 seconds  Prone spinal extension with great  difficulty noted  2 sets 10x PWR! High floor posture exercise modified to perform in standing at counter with MOD A to steady--emphasis on spinal extensor strength  Theract: Sit to stand training from progressively lowering mat heights with verbal cues, tactile cues, and demonstration to improve forward weight shift. Also practiced with a chair in front of pt with BUE placed on chair, Also practiced with chair in front as visual cue to forward lean without use of BUE. MIN A needed at times to promote forward lean and to steady pt upon rising.                        PT Short Term Goals - 10/12/14 0813    PT SHORT TERM GOAL #1   Title Pt will demonstrate independence with HEP Target: 10/06/14   Status Achieved   PT SHORT TERM GOAL #2   Title Pt will increase BERG score to 36/56 for improving balance.  Target: 10/06/14   Status Achieved   PT SHORT TERM GOAL #3   Title Pt will Decrease TUG time to 14.69 seconds for improving efficiency with functional mobility using a single point cane. Target 10/06/14   Status Not Met  18.75 on 10/12/14   PT SHORT TERM GOAL #4   Title Pt will ambulate independently on indoor level surfaces with cane.  Target:  10/06/14   Status Not Met  requires CGA and demonstrates inefficient gait pattern with cane   PT SHORT TERM GOAL #5   Title Pt will perform sit to stand without UE support, independently.   Target: 10/06/14   Status Not Met           PT Long Term Goals - 09/04/14 1230    PT LONG TERM GOAL #1   Title Pt will increase Berg score to 49/56 for improved balance.  Target 11/03/14   PT LONG TERM GOAL #2   Title Pt will ambulate independently on indoor level surfaces without assistive device.  Target 11/03/14   PT LONG TERM GOAL #3   Title Pt will ambulate with suprervision on outdoor unlevel grass and concrete without assistive device. Target 11/03/14   PT LONG TERM GOAL #4   Title Pt will decrease TUG time to <13.5 seconds without assistive  device for decreased fall risk and increased independence. Target 11/03/14   PT LONG TERM GOAL #5   Title Pt will increase gait speed to >2.63 ft/sec for independent community ambulator status. Target 11/03/14   Additional Long Term Goals   Additional Long Term Goals Yes   PT LONG TERM GOAL #6   Title Increase FOTO ABC to 30% for improved balance conficdence.               Plan - 10/19/14 1307    Clinical Impression Statement Pt demonstrated increased activity tolerance. Decreased frequency of rest breaks required. Worked a lot on proper form of sit to stand with some improvement in forward lean noted, but pt has balance instability upon rising.   PT Next Visit Plan Continue with sit to stand training, glut med strengthening        Problem List Patient Active Problem List   Diagnosis Date Noted  . Gait disorder 09/05/2014  . Multiple myeloma   . Paraplegia 06/21/2014  . Postoperative anemia due to acute blood loss 06/21/2014  . 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 Spine metastasis 06/17/2014  . Thoracic spine tumor 06/17/2014  . Metastatic bone cancer 06/15/2014   Delrae Sawyers, PT,DPT,NCS 10/19/2014 1:14 PM Phone [phone] FAX [phone]         [facility] [address] [place], [place], [address] Phone: [phone]   Fax:  [phone]

## 2014-10-23 ENCOUNTER — Ambulatory Visit: Payer: Medicare Other

## 2014-10-23 ENCOUNTER — Telehealth: Payer: Self-pay | Admitting: Medical Oncology

## 2014-10-23 ENCOUNTER — Encounter: Payer: Medicare Other | Admitting: Occupational Therapy

## 2014-10-23 DIAGNOSIS — D492 Neoplasm of unspecified behavior of bone, soft tissue, and skin: Secondary | ICD-10-CM | POA: Diagnosis not present

## 2014-10-23 DIAGNOSIS — M2560 Stiffness of unspecified joint, not elsewhere classified: Secondary | ICD-10-CM | POA: Diagnosis not present

## 2014-10-23 DIAGNOSIS — R29898 Other symptoms and signs involving the musculoskeletal system: Secondary | ICD-10-CM

## 2014-10-23 DIAGNOSIS — R6889 Other general symptoms and signs: Secondary | ICD-10-CM | POA: Diagnosis not present

## 2014-10-23 DIAGNOSIS — C7951 Secondary malignant neoplasm of bone: Secondary | ICD-10-CM | POA: Diagnosis not present

## 2014-10-23 DIAGNOSIS — C9000 Multiple myeloma not having achieved remission: Secondary | ICD-10-CM | POA: Diagnosis not present

## 2014-10-23 DIAGNOSIS — Z7409 Other reduced mobility: Secondary | ICD-10-CM | POA: Diagnosis not present

## 2014-10-23 DIAGNOSIS — M25511 Pain in right shoulder: Secondary | ICD-10-CM | POA: Diagnosis not present

## 2014-10-23 DIAGNOSIS — R293 Abnormal posture: Secondary | ICD-10-CM

## 2014-10-23 NOTE — Therapy (Signed)
[facility] [address] [place], [place], [address] Phone: [phone]   Fax:  [phone]  Physical Therapy Treatment  Patient Details  Name: Jeremiah Owens MRN: [id number] Date of Birth: 24-Jul-1930 Referring Provider:  Wenda Low, MD  Encounter Date: 10/23/2014      PT End of Session - 10/23/14 1410    Visit Number 12   Number of Visits 17   Date for PT Re-Evaluation 11/03/14   Authorization Type Medicare [employer] Primary, [employer] secondary G codes required   PT Start Time 1317   PT Stop Time 1400   PT Time Calculation (min) 43 min   Equipment Utilized During Treatment Gait belt   Activity Tolerance Patient tolerated treatment well   Behavior During Therapy [facility] for tasks assessed/performed      Past Medical History  Diagnosis Date  . Compression fracture   . Melanoma   . Bone cancer     t_spine T-5  . Prostate cancer 2002  . Skin cancer 1994    melanoma  right neck   . Multiple myeloma   . Allergy   . Anxiety   . Thyroid disease   . S/P radiation therapy 08/21/14-09/04/14    T4-6 25Gy/88f    Past Surgical History  Procedure Laterality Date  . Hernia repair    . Posterior cervical fusion/foraminotomy N/A 06/17/2014    Procedure: Thoracic five laminectomy for epidural tumor resection Thoracic 4-7 posterior lateral arthrodesis, segmental pedicle screw fixation.;  Surgeon: KAshok Pall MD;  [facility];  Service: Neurosurgery;  Laterality: N/A;    There were no vitals filed for this visit.  Visit Diagnosis:  Weakness of left leg  Posture abnormality  Activity intolerance      Subjective Assessment - 10/23/14 1321    Symptoms Have an MRI this afternoon to check to see if have cancer in this right shoulder.  Had a busy day yesterday after church and lunch and going to a play in the afternoon.  Had to have bolld drawn prior to MRI.  So have been very busy today.   Pain Score 0-No pain   up to 2/10 when bothering me   Pain Location Leg   Pain Orientation Right   Pain Descriptors / Indicators Burning  and feeling like leg is going to give out   Pain Frequency Intermittent   Aggravating Factors  worse first in the morning or when real tired   Pain Relieving Factors better up movning during the day.                       [facility]ercise - 10/23/14 1334    Transfers   Transfers Sit to Stand;Stand to Sit   Sit to Stand 4: Min guard;5: Supervision;From bed   Sit to Stand Details (indicate cue type and reason) Hands on blue therpy ball and bending over to reach out then stand with anterior weight shiftr x 10   Stand to Sit 5: Supervision;4: Min guard   Stand to Sit Details hands on blue ball and bending knees to sit with anterior weight shift. x 10   High Level Balance   High Level Balance Comments --   Exercises   Exercises Lumbar;Knee/Hip   Lumbar Exercises: Stretches   Lower Trunk Rotation 5 reps  x 2   Lower Trunk Rotation Limitations LE's on green therapy ball   Lumbar Exercises: Supine   Bridge 10 reps;5 seconds  Bridge Limitations LE's on green therapy ball.    Other Supine Lumbar Exercises lower trunk flexion with feet on green therapy ball x 10    Knee/Hip Exercises: Stretches   Passive Hamstring Stretch 2 reps;30 seconds  and knee to chest stretch 30 sec between reps   Knee/Hip Exercises: Standing   Forward Lunges 10 reps;Both;2 seconds   Forward Lunges Limitations UE support in parallel bars up onto BOSU   Lateral Step Up Both;1 set;10 reps;Hand Hold: 2;Step Height: 8"  onto BOSU 10 left up first, then 10 right up first   Rocker Board 2 minutes   Rocker Board Limitations rocking ant/post cues for hip ext/flex for limits of stability                  PT Short Term Goals - 10/23/14 1413    PT SHORT TERM GOAL #1   Title Pt will demonstrate independence with HEP Target: 10/06/14   Status Achieved   PT SHORT  TERM GOAL #2   Title Pt will increase BERG score to 36/56 for improving balance.  Target: 10/06/14   Status Achieved   PT SHORT TERM GOAL #3   Title Pt will Decrease TUG time to 14.69 seconds for improving efficiency with functional mobility using a single point cane. Target 10/06/14   Status Not Met  18.75 on 10/12/14   PT SHORT TERM GOAL #4   Title Pt will ambulate independently on indoor level surfaces with cane.  Target: 10/06/14   Status Not Met  requires CGA and demonstrates inefficient gait pattern with cane   PT SHORT TERM GOAL #5   Title Pt will perform sit to stand without UE support, independently.   Target: 10/06/14   Status Not Met           PT Long Term Goals - 10/23/14 1413    PT LONG TERM GOAL #1   Title Pt will increase Berg score to 49/56 for improved balance.  Target 11/03/14   Status On-going   PT LONG TERM GOAL #2   Title Pt will ambulate independently on indoor level surfaces without assistive device.  Target 11/03/14   Status On-going   PT LONG TERM GOAL #3   Title Pt will ambulate with suprervision on outdoor unlevel grass and concrete without assistive device. Target 11/03/14   Status On-going   PT LONG TERM GOAL #4   Title Pt will decrease TUG time to <13.5 seconds without assistive device for decreased fall risk and increased independence. Target 11/03/14   Status On-going   PT LONG TERM GOAL #5   Title Pt will increase gait speed to >2.63 ft/sec for independent community ambulator status. Target 11/03/14   Status On-going   PT LONG TERM GOAL #6   Title Increase FOTO ABC to 30% for improved balance conficdence.   Status On-going               Plan - 10/23/14 1410    Clinical Impression Statement Patient able to perform sit<>stand with hands on ball in front with improved anterior weight shift.  Feel needs to expand HEP for continued work on this at home.  Mild pain in calves with lunges and wobble board activities, but resolved with seated rest.  Continue  skilled PT for progress to all goals.   Pt will benefit from skilled therapeutic intervention in order to improve on the following deficits Abnormal gait;Decreased balance;Difficulty walking;Decreased strength;Postural dysfunction   Rehab Potential Good   PT  Frequency 2x / week   PT Duration 8 weeks   PT Treatment/Interventions ADLs/Self Care Home Management;Gait training;Therapeutic exercise;Patient/family education;Stair training;Balance training;Functional mobility training;Neuromuscular re-education;Manual techniques;Therapeutic activities;DME Instruction   PT Next Visit Plan HEP for hamstring stretch and sit<>stand hands on chair in front   Consulted and Agree with Plan of Care Patient        Problem List Patient Active Problem List   Diagnosis Date Noted  . Gait disorder 09/05/2014  . Multiple myeloma   . Paraplegia 06/21/2014  . Postoperative anemia due to acute blood loss 06/21/2014  . 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 Spine metastasis 06/17/2014  . Thoracic spine tumor 06/17/2014  . Metastatic bone cancer 06/15/2014    Bradey Luzier,CYNDI 10/23/2014, 2:18 PM  [facility] [address] [place], [place], [address] Phone: [phone]   Fax:  [phone]

## 2014-10-23 NOTE — Telephone Encounter (Signed)
Note received for dental clearance for [facility]

## 2014-10-24 ENCOUNTER — Encounter: Payer: Medicare Other | Admitting: Occupational Therapy

## 2014-10-24 ENCOUNTER — Ambulatory Visit: Payer: Medicare Other

## 2014-10-24 DIAGNOSIS — R262 Difficulty in walking, not elsewhere classified: Secondary | ICD-10-CM

## 2014-10-24 DIAGNOSIS — R279 Unspecified lack of coordination: Secondary | ICD-10-CM

## 2014-10-24 DIAGNOSIS — D492 Neoplasm of unspecified behavior of bone, soft tissue, and skin: Secondary | ICD-10-CM | POA: Diagnosis not present

## 2014-10-24 DIAGNOSIS — Z7409 Other reduced mobility: Secondary | ICD-10-CM | POA: Diagnosis not present

## 2014-10-24 DIAGNOSIS — R6889 Other general symptoms and signs: Secondary | ICD-10-CM | POA: Diagnosis not present

## 2014-10-24 DIAGNOSIS — M2560 Stiffness of unspecified joint, not elsewhere classified: Secondary | ICD-10-CM | POA: Diagnosis not present

## 2014-10-24 DIAGNOSIS — C9000 Multiple myeloma not having achieved remission: Secondary | ICD-10-CM | POA: Diagnosis not present

## 2014-10-24 DIAGNOSIS — R293 Abnormal posture: Secondary | ICD-10-CM

## 2014-10-24 DIAGNOSIS — Z6825 Body mass index (BMI) 25.0-25.9, adult: Secondary | ICD-10-CM | POA: Diagnosis not present

## 2014-10-24 DIAGNOSIS — C7951 Secondary malignant neoplasm of bone: Secondary | ICD-10-CM | POA: Diagnosis not present

## 2014-10-24 DIAGNOSIS — R29898 Other symptoms and signs involving the musculoskeletal system: Secondary | ICD-10-CM

## 2014-10-24 NOTE — Patient Instructions (Signed)
STANDING: Modified Sit to Stand   Place hands on chair. Lean forward, raise hips up from surface. Straighten hips and knees. Weight bear equally on left and right sides. 10 reps per set, 1 sets per day, 5-7 days per week Place wedge under hand if wrist range of motion is limited.  Copyright  VHI. All rights reserved.     PELVIC TILT: Anterior   Start in slumped position. Roll pelvis forward to arch back. Perform 10x.   Copyright  VHI. All rights reserved.     Lower Trunk Rotation Stretch   Keeping back flat and feet together, rotate knees to left side. Hold 5 seconds. Then rotate to right side and hold 5 seconds.  Repeat 5 times per side. http://orth.exer.us/122   Copyright  VHI. All rights reserved.    Knee-to-Chest Stretch: Bilateral   With hands behind knees, pull both knees in to chest until a comfortable stretch is felt in lower back and buttocks. Keep back relaxed.Hold for 3 deep breaths. Perform 5x. http://orth.exer.us/128   Copyright  VHI. All rights reserved.    HIP: Hamstrings - Short Sitting   Scoot to edge of chair. Place one leg out in front of you, knee straight and toes pulled up. Stick chest out and lean forward, leading with your chest until you feel a gentle stretch. Hold 30 seconds. Repeat 3x in morning and 3x at night on each leg.  Copyright  VHI. All rights reserved.

## 2014-10-24 NOTE — Therapy (Signed)
[facility] [address] [place], [place], [address] Phone: [phone]   Fax:  [phone]  Physical Therapy Treatment  Patient Details  Name: Pranay Hilbun MRN: [id number] Date of Birth: 10/05/1929 Referring Provider:  Wenda Low, MD  Encounter Date: 10/24/2014      PT End of Session - 10/24/14 1225    Visit Number 13   Number of Visits 17   Date for PT Re-Evaluation 11/03/14   Authorization Type Medicare [employer] Primary, T[employer] secondary G codes required   PT Start Time 0934   PT Stop Time 1018   PT Time Calculation (min) 44 min      Past Medical History  Diagnosis Date  . Compression fracture   . Melanoma   . Bone cancer     t_spine T-5  . Prostate cancer 2002  . Skin cancer 1994    melanoma  right neck   . Multiple myeloma   . Allergy   . Anxiety   . Thyroid disease   . S/P radiation therapy 08/21/14-09/04/14    T4-6 25Gy/32fx    Past Surgical History  Procedure Laterality Date  . Hernia repair    . Posterior cervical fusion/foraminotomy N/A 06/17/2014    Procedure: Thoracic five laminectomy for epidural tumor resection Thoracic 4-7 posterior lateral arthrodesis, segmental pedicle screw fixation.;  Surgeon: Ashok Pall, MD;  Location: [facility];  Service: Neurosurgery;  Laterality: N/A;    There were no vitals filed for this visit.  Visit Diagnosis:  Difficulty walking  Lack of coordination  Weakness of left leg  Posture abnormality      Subjective Assessment - 10/24/14 0939    Symptoms Pt reports feeling less stiff today despite busy day yesterday. Pt reports the stretching exercises from yesterday were helpful for decreasing tension.   Currently in Pain? No/denies     Therex:  Taught additional HEP see pt instructions  Neuro re-ed -2x10 minisquats on inverted BOSU ball with single UE support and MOD A  On first set for balance and facilitating proper form then CGA on second  set -anterior/posterior limits of stability on inverted bosu ball with single UE support and MOD A for balance nd verbal cues for form.   Gait training [phone] without assistive device with MOD A to steady pt with pt noted to scissor and verbal cues to correct.                [facility] Adult PT Treatment/Exercise - 10/23/14 1334    Transfers   Transfers Sit to Stand;Stand to Sit   Sit to Stand 4: Min guard;5: Supervision;From bed   Sit to Stand Details (indicate cue type and reason) Hands on blue therpy ball and bending over to reach out then stand with anterior weight shiftr x 10   Stand to Sit 5: Supervision;4: Min guard   Stand to Sit Details hands on blue ball and bending knees to sit with anterior weight shift. x 10   High Level Balance   High Level Balance Comments --   Exercises   Exercises Lumbar;Knee/Hip   Lumbar Exercises: Stretches   Lower Trunk Rotation 5 reps  x 2   Lower Trunk Rotation Limitations LE's on green therapy ball   Lumbar Exercises: Supine   Bridge 10 reps;5 seconds   Bridge Limitations LE's on green therapy ball.    Other Supine Lumbar Exercises lower trunk flexion with feet on green therapy ball x 10    Knee/Hip Exercises:  Stretches   Passive Hamstring Stretch 2 reps;30 seconds  and knee to chest stretch 30 sec between reps   Knee/Hip Exercises: Standing   Forward Lunges 10 reps;Both;2 seconds   Forward Lunges Limitations UE support in parallel bars up onto BOSU   Lateral Step Up Both;1 set;10 reps;Hand Hold: 2;Step Height: 8"  onto BOSU 10 left up first, then 10 right up first   Rocker Board 2 minutes   Rocker Board Limitations rocking ant/post cues for hip ext/flex for limits of stability                  PT Short Term Goals - 10/23/14 1413    PT SHORT TERM GOAL #1   Title Pt will demonstrate independence with HEP Target: 10/06/14   Status Achieved   PT SHORT TERM GOAL #2   Title Pt will increase BERG score to 36/56 for improving  balance.  Target: 10/06/14   Status Achieved   PT SHORT TERM GOAL #3   Title Pt will Decrease TUG time to 14.69 seconds for improving efficiency with functional mobility using a single point cane. Target 10/06/14   Status Not Met  18.75 on 10/12/14   PT SHORT TERM GOAL #4   Title Pt will ambulate independently on indoor level surfaces with cane.  Target: 10/06/14   Status Not Met  requires CGA and demonstrates inefficient gait pattern with cane   PT SHORT TERM GOAL #5   Title Pt will perform sit to stand without UE support, independently.   Target: 10/06/14   Status Not Met           PT Long Term Goals - 10/23/14 1413    PT LONG TERM GOAL #1   Title Pt will increase Berg score to 49/56 for improved balance.  Target 11/03/14   Status On-going   PT LONG TERM GOAL #2   Title Pt will ambulate independently on indoor level surfaces without assistive device.  Target 11/03/14   Status On-going   PT LONG TERM GOAL #3   Title Pt will ambulate with suprervision on outdoor unlevel grass and concrete without assistive device. Target 11/03/14   Status On-going   PT LONG TERM GOAL #4   Title Pt will decrease TUG time to <13.5 seconds without assistive device for decreased fall risk and increased independence. Target 11/03/14   Status On-going   PT LONG TERM GOAL #5   Title Pt will increase gait speed to >2.63 ft/sec for independent community ambulator status. Target 11/03/14   Status On-going   PT LONG TERM GOAL #6   Title Increase FOTO ABC to 30% for improved balance conficdence.   Status On-going               Plan - 10/24/14 1226    Clinical Impression Statement Pt demonstrated increased balance and activity tolerance today. Continue per plan, may renew if appropriate.   PT Next Visit Plan Begin checking LTGs and determine if renewal is appropriate   Consulted and Agree with Plan of Care Patient        Problem List Patient Active Problem List   Diagnosis Date Noted  . Gait disorder  09/05/2014  . Multiple myeloma   . Paraplegia 06/21/2014  . Postoperative anemia due to acute blood loss 06/21/2014  . 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 Spine metastasis 06/17/2014  . Thoracic spine tumor 06/17/2014  . Metastatic bone cancer 06/15/2014    Delrae Sawyers D 10/24/2014, 12:29 PM  [facility] [address] [place], [place], [address] Phone: [phone]   Fax:  [phone]

## 2014-10-25 ENCOUNTER — Telehealth: Payer: Self-pay | Admitting: *Deleted

## 2014-10-25 ENCOUNTER — Other Ambulatory Visit (HOSPITAL_BASED_OUTPATIENT_CLINIC_OR_DEPARTMENT_OTHER): Payer: Medicare Other

## 2014-10-25 ENCOUNTER — Telehealth: Payer: Self-pay | Admitting: Internal Medicine

## 2014-10-25 ENCOUNTER — Encounter: Payer: Self-pay | Admitting: Internal Medicine

## 2014-10-25 ENCOUNTER — Ambulatory Visit (HOSPITAL_BASED_OUTPATIENT_CLINIC_OR_DEPARTMENT_OTHER): Payer: Medicare Other

## 2014-10-25 ENCOUNTER — Ambulatory Visit (HOSPITAL_BASED_OUTPATIENT_CLINIC_OR_DEPARTMENT_OTHER): Payer: Medicare Other | Admitting: Internal Medicine

## 2014-10-25 VITALS — BP 130/59 | HR 77 | Temp 98.7°F | Resp 18 | Ht 69.0 in | Wt 174.6 lb

## 2014-10-25 DIAGNOSIS — C9002 Multiple myeloma in relapse: Secondary | ICD-10-CM

## 2014-10-25 DIAGNOSIS — C7951 Secondary malignant neoplasm of bone: Secondary | ICD-10-CM

## 2014-10-25 DIAGNOSIS — G893 Neoplasm related pain (acute) (chronic): Secondary | ICD-10-CM | POA: Diagnosis not present

## 2014-10-25 DIAGNOSIS — C9000 Multiple myeloma not having achieved remission: Secondary | ICD-10-CM

## 2014-10-25 DIAGNOSIS — M25511 Pain in right shoulder: Secondary | ICD-10-CM | POA: Diagnosis not present

## 2014-10-25 DIAGNOSIS — Z5112 Encounter for antineoplastic immunotherapy: Secondary | ICD-10-CM | POA: Diagnosis not present

## 2014-10-25 DIAGNOSIS — C419 Malignant neoplasm of bone and articular cartilage, unspecified: Secondary | ICD-10-CM

## 2014-10-25 LAB — COMPREHENSIVE METABOLIC PANEL (CC13)
ALT: 24 U/L (ref 0–55)
AST: 19 U/L (ref 5–34)
Albumin: 3.4 g/dL — ABNORMAL LOW (ref 3.5–5.0)
Alkaline Phosphatase: 138 U/L (ref 40–150)
Anion Gap: 8 mEq/L (ref 3–11)
BUN: 16.2 mg/dL (ref 7.0–26.0)
CO2: 28 mEq/L (ref 22–29)
Calcium: 8.8 mg/dL (ref 8.4–10.4)
Chloride: 103 mEq/L (ref 98–109)
Creatinine: 0.8 mg/dL (ref 0.7–1.3)
EGFR: 82 mL/min/{1.73_m2} — ABNORMAL LOW (ref >90)
Glucose: 88 mg/dl (ref 70–140)
Potassium: 4.1 mEq/L (ref 3.5–5.1)
Sodium: 139 mEq/L (ref 136–145)
Total Bilirubin: 0.5 mg/dL (ref 0.20–1.20)
Total Protein: 6.6 g/dL (ref 6.4–8.3)

## 2014-10-25 LAB — CBC WITH DIFFERENTIAL/PLATELET
BASO%: 0.2 % (ref 0.0–2.0)
Basophils Absolute: 0 10*3/uL (ref 0.0–0.1)
EOS%: 8.1 % — ABNORMAL HIGH (ref 0.0–7.0)
Eosinophils Absolute: 0.4 10*3/uL (ref 0.0–0.5)
HCT: 35.2 % — ABNORMAL LOW (ref 38.4–49.9)
HGB: 11.8 g/dL — ABNORMAL LOW (ref 13.0–17.1)
LYMPH%: 8.9 % — ABNORMAL LOW (ref 14.0–49.0)
MCH: 32.2 pg (ref 27.2–33.4)
MCHC: 33.5 g/dL (ref 32.0–36.0)
MCV: 96.2 fL (ref 79.3–98.0)
MONO#: 0.5 10*3/uL (ref 0.1–0.9)
MONO%: 10.2 % (ref 0.0–14.0)
NEUT#: 3.4 10*3/uL (ref 1.5–6.5)
NEUT%: 72.6 % (ref 39.0–75.0)
Platelets: 127 10*3/uL — ABNORMAL LOW (ref 140–400)
RBC: 3.66 10*6/uL — ABNORMAL LOW (ref 4.20–5.82)
RDW: 14.4 % (ref 11.0–14.6)
WBC: 4.7 10*3/uL (ref 4.0–10.3)
lymph#: 0.4 10*3/uL — ABNORMAL LOW (ref 0.9–3.3)

## 2014-10-25 MED ORDER — ONDANSETRON HCL 8 MG PO TABS
8.0000 mg | ORAL_TABLET | Freq: Once | ORAL | Status: AC
  Administered 2014-10-25: 8 mg via ORAL

## 2014-10-25 MED ORDER — ONDANSETRON HCL 8 MG PO TABS
ORAL_TABLET | ORAL | Status: AC
  Filled 2014-10-25: qty 1

## 2014-10-25 MED ORDER — BORTEZOMIB CHEMO SQ INJECTION 3.5 MG (2.5MG/ML)
1.3000 mg/m2 | Freq: Once | INTRAMUSCULAR | Status: AC
  Administered 2014-10-25: 2.5 mg via SUBCUTANEOUS
  Filled 2014-10-25: qty 2.5

## 2014-10-25 NOTE — Progress Notes (Signed)
1155- pt ambulatory with walker and wife; pt escorted to lobby.

## 2014-10-25 NOTE — Telephone Encounter (Signed)
Pt confirmed labs/ov per 03/23 POF, gave pt AVS and Calendar..... KJ, sent msg to add chemo and also sent msg to MD to confirm I moved MD visit up a wk due to pt's son will be coming in that week and would like to come meet MD and go over status of pt's condition.Marland KitchenMarland KitchenMarland Kitchen

## 2014-10-25 NOTE — Progress Notes (Signed)
[facility] Telephone:([phone]   Fax:(336) [place] [address] [place] [address]  DIAGNOSIS: Multiple myeloma diagnosed in November 2015  PRIOR THERAPY:  1) Status post Thoracic five laminectomy for epidural tumor resection, Thoracic 4-7 posterior lateral arthrodesis,  segmental pedicle screw fixation T4-T7 Globus instrumentation under the care of Dr. Christella Noa on 06/18/2014. 2) Systemic chemotherapy with Velcade 1.3 MG/M2 subcutaneously weekly in addition to Decadron 40 mg by mouth on weekly basis. Status post 11 cycles.  CURRENT THERAPY:  1) Systemic chemotherapy with Velcade 1.3 MG/M2 subcutaneously weekly, Decadron 40 mg by mouth weekly in addition to Revlimid 25 MG by mouth daily for 21 days every 4 weeks, status post 1 cycle. 2) Zometa 4 mg IV every month. First dose 11/01/2014.  INTERVAL HISTORY: Rochelle Nephew 81 y.o. male returns to the clinic today for follow-up visit accompanied by his wife. The patient is currently on treatment with weekly subcutaneous Velcade, Revlimid and Decadron status post 1 cycle and tolerating the treatment fairly well. He denied having any significant nausea or vomiting, no fever or chills. He denied having any significant weight loss or night sweats. He denied having any significant chest pain, shortness breath, cough or hemoptysis. He was complaining of right shoulder pain recently and he had CT of the cervical and thoracic spine ordered by Dr. Christella Noa in addition to MRI of the right shoulder and humerus ordered by Dr. Rhona Raider that showed evidence for multiple lytic lesions in the same area consistent with the patient his history of multiple myeloma. He is currently on pain medication with Norco 5/325 mg. He is here today to start cycle #2 of his systemic chemotherapy with Velcade, Revlimid and Decadron.  MEDICAL HISTORY: Past Medical History  Diagnosis Date  .  Compression fracture   . Melanoma   . Bone cancer     t_spine T-5  . Prostate cancer 2002  . Skin cancer 1994    melanoma  right neck   . Multiple myeloma   . Allergy   . Anxiety   . Thyroid disease   . S/P radiation therapy 08/21/14-09/04/14    T4-6 25Gy/76f    ALLERGIES:  is allergic to iodine.  MEDICATIONS:  Current Outpatient Prescriptions  Medication Sig Dispense Refill  . acyclovir (ZOVIRAX) 400 MG tablet TAKE 1 TABLET BY MOUTH TWICE A DAY 60 tablet 2  . cholecalciferol (VITAMIN D) 1000 UNITS tablet Take 1,000 Units by mouth at bedtime.     .Marland Kitchendexamethasone (DECADRON) 4 MG tablet 10 tab every week, start with chemo 40 tablet 4  . hyaluronate sodium (RADIAPLEXRX) GEL Apply 1 application topically daily. Apply to treated area on skin after rad txs daily and prn    . HYDROcodone-acetaminophen (NORCO) 5-325 MG per tablet Take 1 tablet by mouth every 6 (six) hours as needed for moderate pain. 60 tablet 0  . HYDROCORTISONE, TOPICAL, 2.5 % SOLN Apply 1 application topically daily as needed.     .Marland Kitchenlenalidomide (REVLIMID) 25 MG capsule Take 1 capsule (25 mg total) by mouth daily. Authorization number [id number] adult male 21 capsule 0  . levothyroxine (SYNTHROID, LEVOTHROID) 25 MCG tablet Take 1 tablet (25 mcg total) by mouth daily. 30 tablet 4  . methocarbamol (ROBAXIN) 500 MG tablet TAKE 1 TABLET (500 MG TOTAL) BY MOUTH EVERY 6 (SIX) HOURS AS NEEDED FOR MUSCLE SPASMS. 60 tablet 0  . Multiple Vitamins-Minerals (CENTRUM SILVER PO) Take  1 tablet by mouth daily.    Marland Kitchen omeprazole (PRILOSEC) 40 MG capsule Take 40 mg by mouth daily. In the afternoon  11  . Polyvinyl Alcohol-Povidone (REFRESH OP) Apply 1 drop to eye daily as needed (dry/irritated eyes).    . prochlorperazine (COMPAZINE) 10 MG tablet TAKE 1 TABLET BY MOUTH EVERY 6 HOURS AS NEEDED FOR NAUSEA AND VOMITING 30 tablet 0  . ranitidine (ZANTAC) 150 MG tablet Take 150 mg by mouth at bedtime.   11  . tamsulosin (FLOMAX) 0.4 MG CAPS  capsule Take 1 capsule (0.4 mg total) by mouth at bedtime. 30 capsule 3  . warfarin (COUMADIN) 2 MG tablet Take 1 tablet (2 mg total) by mouth daily. 30 tablet 3   No current facility-administered medications for this visit.    SURGICAL HISTORY:  Past Surgical History  Procedure Laterality Date  . Hernia repair    . Posterior cervical fusion/foraminotomy N/A 06/17/2014    Procedure: Thoracic five laminectomy for epidural tumor resection Thoracic 4-7 posterior lateral arthrodesis, segmental pedicle screw fixation.;  Surgeon: Ashok Pall, MD;  Location: [facility];  Service: Neurosurgery;  Laterality: N/A;    REVIEW OF SYSTEMS:  Constitutional: positive for fatigue Eyes: negative Ears, nose, mouth, throat, and face: negative Respiratory: negative Cardiovascular: negative Gastrointestinal: negative Genitourinary:negative Integument/breast: negative Hematologic/lymphatic: negative Musculoskeletal:positive for muscle weakness Neurological: positive for weakness Behavioral/Psych: negative Endocrine: negative Allergic/Immunologic: negative   PHYSICAL EXAMINATION: General appearance: alert, cooperative, fatigued and no distress Head: Normocephalic, without obvious abnormality, atraumatic Neck: no adenopathy, no JVD, supple, symmetrical, trachea midline and thyroid not enlarged, symmetric, no tenderness/mass/nodules Lymph nodes: Cervical, supraclavicular, and axillary nodes normal. Resp: clear to auscultation bilaterally Back: symmetric, no curvature. ROM normal. No CVA tenderness. Cardio: regular rate and rhythm, S1, S2 normal, no murmur, click, rub or gallop GI: soft, non-tender; bowel sounds normal; no masses,  no organomegaly Extremities: extremities normal, atraumatic, no cyanosis or edema Neurologic: Motor: Weakness in the left foot  ECOG PERFORMANCE STATUS: 1 - Symptomatic but completely ambulatory  Blood pressure 130/59, pulse 77, temperature 98.7 F (37.1 C), temperature  source Oral, resp. rate 18, height '5\' 9"'  (1.753 m), weight 174 lb 9.6 oz (79.198 kg), SpO2 98 %.  LABORATORY DATA: Lab Results  Component Value Date   WBC 4.7 10/25/2014   HGB 11.8* 10/25/2014   HCT 35.2* 10/25/2014   MCV 96.2 10/25/2014   PLT 127* 10/25/2014      Chemistry      Component Value Date/Time   NA 139 10/25/2014 1016   NA 133* 07/04/2014 0430   K 4.1 10/25/2014 1016   K 4.1 07/04/2014 0430   CL 97 07/04/2014 0430   CO2 28 10/25/2014 1016   CO2 27 07/04/2014 0430   BUN 16.2 10/25/2014 1016   BUN 18 07/04/2014 0430   CREATININE 0.8 10/25/2014 1016   CREATININE 0.90 07/04/2014 0430      Component Value Date/Time   CALCIUM 8.8 10/25/2014 1016   CALCIUM 8.9 07/04/2014 0430   ALKPHOS 138 10/25/2014 1016   ALKPHOS 47 06/21/2014 0406   AST 19 10/25/2014 1016   AST 31 06/21/2014 0406   ALT 24 10/25/2014 1016   ALT 29 06/21/2014 0406   BILITOT 0.50 10/25/2014 1016   BILITOT <0.2* 06/21/2014 0406       RADIOGRAPHIC STUDIES: Ct Cervical Spine Wo Contrast  10/18/2014   CLINICAL DATA:  Multiple myeloma with failed remission. Neck pain, right shoulder pain, and mid thoracic pain. Thoracic surgery November 2015.  EXAM: CT CERVICAL SPINE WITHOUT CONTRAST  CT THORACIC SPINE WITHOUT CONTRAST  TECHNIQUE: Multidetector CT imaging of the cervical and thoracic spine was performed without contrast. Multiplanar CT image reconstructions were also generated.  COMPARISON:  Thoracic spine radiograph 10/12/2014. CT of the chest 06/17/2014. MRI of the thoracic spine 06/08/2014.  FINDINGS: CT CERVICAL SPINE FINDINGS  Multiple lytic lesions are evident. A lesion within the right anterior body of C3 measures 8 x 6.5 x 6.5 mm. There is a breech of the cortex anteriorly  A lesion anteriorly within the C7 vertebral body is larger than on the prior studies. It now measures 12 x 10.5 x 11.5 mm.  A 5 mm lesion is present in the right inferior articulating facet of C4.  Multilevel degenerative changes  are noted. There is fusion of posterior elements at C6-7. Exaggerated cervical lordosis is evident.  Moderate foraminal narrowing is present bilaterally at C3-4 due to facet hypertrophy and uncovertebral disease.  C4-5:  Moderate foraminal stenosis is present bilaterally.  C5-6:  Facet hypertrophy is present without significant stenosis.  C6-7:  Facet fusion is noted.  The foramina are patent.  C7-T1:  Negative.  CT THORACIC SPINE FINDINGS  Twelve rib-bearing thoracic type vertebral bodies are present. Patient is status post partial laminectomy at T4 and laminectomy at T5. Pedicle screws are present on the a left at T4 and T7 on the right at T4, T6, and T7. There is minimal lucency about the right T7 screw without evidence for hardware failure.  A lytic lesion anteriorly at T2 has enlarged, now measuring 18 x 17 x 13 mm.  A lesion in the posterior elements of T3 on the left measures 13 mm.  A significant ventral soft tissue component is present at the T5 level displacing the thecal sac and spinal cord posteriorly. This could be better resolved with MRI. There is significant destruction of the T5 vertebral body with tumor extending into the posterior elements bilaterally.  A lesion posteriorly at T6 has enlarged. The surrounds the right pedicle screw.  A lesion anteriorly at T7 has enlarged, now measuring 10 mm.  A new lesion within the left posterior aspect of T8 at the base the pedicle measures 10 x 6 mm.  A lesion in the posterior element is at T9 measures 10 mm maximally.  A lesion posteriorly within the T10 vertebral body has expanded significantly, now measuring 17 x 11 x 17 mm.  An anterior lesion at L1 measures at least 16 mm.  Apart from the T5 lesion, there is no definite extraosseous extension of tumor.  Degenerative endplate changes and facet hypertrophy contribute to foraminal narrowing bilaterally at T10-11, worse on the right. There is mild foraminal narrowing bilaterally at T5-6 is well.  Endplate  degenerative changes throughout thoracic spine contribute to exaggerated kyphosis, similar to the prior study.  IMPRESSION: 1. Progression of multiple lytic lesions throughout the cervical and thoracic spine as detailed above. 2. Laminectomy at T4 and T5 with posterior fusion T4-7. There is minimal loosening scratch the there is minimal lucency surrounding the right pedicle screw at T7. Hard were otherwise appears to be intact. 3. Persistent extraosseous tumor at T5 with posterior displacement of the thecal sac and spinal cord. This could be better resolved with MRI. 4. Multilevel degenerative changes in both the cervical and thoracic spine as detailed above.   Electronically Signed   By: San Morelle M.D.   On: 10/18/2014 10:21   Ct Thoracic Spine Wo Contrast  10/18/2014  CLINICAL DATA:  Multiple myeloma with failed remission. Neck pain, right shoulder pain, and mid thoracic pain. Thoracic surgery November 2015.  EXAM: CT CERVICAL SPINE WITHOUT CONTRAST  CT THORACIC SPINE WITHOUT CONTRAST  TECHNIQUE: Multidetector CT imaging of the cervical and thoracic spine was performed without contrast. Multiplanar CT image reconstructions were also generated.  COMPARISON:  Thoracic spine radiograph 10/12/2014. CT of the chest 06/17/2014. MRI of the thoracic spine 06/08/2014.  FINDINGS: CT CERVICAL SPINE FINDINGS  Multiple lytic lesions are evident. A lesion within the right anterior body of C3 measures 8 x 6.5 x 6.5 mm. There is a breech of the cortex anteriorly  A lesion anteriorly within the C7 vertebral body is larger than on the prior studies. It now measures 12 x 10.5 x 11.5 mm.  A 5 mm lesion is present in the right inferior articulating facet of C4.  Multilevel degenerative changes are noted. There is fusion of posterior elements at C6-7. Exaggerated cervical lordosis is evident.  Moderate foraminal narrowing is present bilaterally at C3-4 due to facet hypertrophy and uncovertebral disease.  C4-5:  Moderate  foraminal stenosis is present bilaterally.  C5-6:  Facet hypertrophy is present without significant stenosis.  C6-7:  Facet fusion is noted.  The foramina are patent.  C7-T1:  Negative.  CT THORACIC SPINE FINDINGS  Twelve rib-bearing thoracic type vertebral bodies are present. Patient is status post partial laminectomy at T4 and laminectomy at T5. Pedicle screws are present on the a left at T4 and T7 on the right at T4, T6, and T7. There is minimal lucency about the right T7 screw without evidence for hardware failure.  A lytic lesion anteriorly at T2 has enlarged, now measuring 18 x 17 x 13 mm.  A lesion in the posterior elements of T3 on the left measures 13 mm.  A significant ventral soft tissue component is present at the T5 level displacing the thecal sac and spinal cord posteriorly. This could be better resolved with MRI. There is significant destruction of the T5 vertebral body with tumor extending into the posterior elements bilaterally.  A lesion posteriorly at T6 has enlarged. The surrounds the right pedicle screw.  A lesion anteriorly at T7 has enlarged, now measuring 10 mm.  A new lesion within the left posterior aspect of T8 at the base the pedicle measures 10 x 6 mm.  A lesion in the posterior element is at T9 measures 10 mm maximally.  A lesion posteriorly within the T10 vertebral body has expanded significantly, now measuring 17 x 11 x 17 mm.  An anterior lesion at L1 measures at least 16 mm.  Apart from the T5 lesion, there is no definite extraosseous extension of tumor.  Degenerative endplate changes and facet hypertrophy contribute to foraminal narrowing bilaterally at T10-11, worse on the right. There is mild foraminal narrowing bilaterally at T5-6 is well.  Endplate degenerative changes throughout thoracic spine contribute to exaggerated kyphosis, similar to the prior study.  IMPRESSION: 1. Progression of multiple lytic lesions throughout the cervical and thoracic spine as detailed above. 2.  Laminectomy at T4 and T5 with posterior fusion T4-7. There is minimal loosening scratch the there is minimal lucency surrounding the right pedicle screw at T7. Hard were otherwise appears to be intact. 3. Persistent extraosseous tumor at T5 with posterior displacement of the thecal sac and spinal cord. This could be better resolved with MRI. 4. Multilevel degenerative changes in both the cervical and thoracic spine as detailed above.   Electronically  Signed   By: San Morelle M.D.   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 He completed treatment with subcutaneous Velcade and Decadron status post 11 weekly doses. This was discontinued secondary to disease progression. The patient was started on treatment with weekly subcutaneous Velcade, Revlimid for 21 days every 4 weeks in addition to Decadron 40 mg weekly status post 1 cycle and he is tolerating it fairly well. I recommended for him to proceed with cycle #2 today as a scheduled. We received dental clearance for Mr. Bob and I will start the patient on treatment with Zometa 4 mg IV every month. I discussed with the patient adverse effect of this treatment including osteonecrosis of the jaw and he would like to proceed with treatment as scheduled. He is expected to receive the first dose of this treatment next week. For the right shoulder and arm pain, the patient will continue on local for now and I may consider referring him to Dr. Lisbeth Renshaw for consideration of palliative radiotherapy to this area if his pain is not controlled. The patient will come back for follow-up visit in 4 weeks for reevaluation and management of any adverse effect of his treatment. He was advised to call immediately if he has any concerning symptoms in the interval. The patient  voices understanding of current disease status and treatment options and is in agreement with the current care plan.  All questions were answered. The patient knows to call the clinic with any problems, questions or concerns. We can certainly see the patient much sooner if necessary.  Disclaimer: This note was dictated with voice recognition software. Similar sounding words can inadvertently be transcribed and may not be corrected upon review.

## 2014-10-25 NOTE — Patient Instructions (Signed)
[facility] Discharge Instructions for Patients Receiving Chemotherapy  Today you received the following chemotherapy agents: Velcade  To help prevent nausea and vomiting after your treatment, we encourage you to take your nausea medication as prescribed by your physician.   If you develop nausea and vomiting that is not controlled by your nausea medication, call the clinic.   BELOW ARE SYMPTOMS THAT SHOULD BE REPORTED IMMEDIATELY:  *FEVER GREATER THAN 100.5 F  *CHILLS WITH OR WITHOUT FEVER  NAUSEA AND VOMITING THAT IS NOT CONTROLLED WITH YOUR NAUSEA MEDICATION  *UNUSUAL SHORTNESS OF BREATH  *UNUSUAL BRUISING OR BLEEDING  TENDERNESS IN MOUTH AND THROAT WITH OR WITHOUT PRESENCE OF ULCERS  *URINARY PROBLEMS  *BOWEL PROBLEMS  UNUSUAL RASH Items with * indicate a potential emergency and should be followed up as soon as possible.  Feel free to call the clinic you have any questions or concerns. The clinic phone number is [phone].  Please show the CHEMO ALERT CARD at check-in to the Emergency Department and triage nurse.   

## 2014-10-25 NOTE — Telephone Encounter (Signed)
Per staff message and POF I have scheduled appts. Advised scheduler of appts. JMW  

## 2014-10-31 ENCOUNTER — Ambulatory Visit: Payer: Medicare Other | Admitting: Occupational Therapy

## 2014-10-31 ENCOUNTER — Ambulatory Visit: Payer: Medicare Other

## 2014-10-31 DIAGNOSIS — R29898 Other symptoms and signs involving the musculoskeletal system: Secondary | ICD-10-CM

## 2014-10-31 DIAGNOSIS — Z7409 Other reduced mobility: Secondary | ICD-10-CM

## 2014-10-31 DIAGNOSIS — M25511 Pain in right shoulder: Secondary | ICD-10-CM

## 2014-10-31 DIAGNOSIS — R6889 Other general symptoms and signs: Secondary | ICD-10-CM | POA: Diagnosis not present

## 2014-10-31 DIAGNOSIS — C7951 Secondary malignant neoplasm of bone: Secondary | ICD-10-CM | POA: Diagnosis not present

## 2014-10-31 DIAGNOSIS — R262 Difficulty in walking, not elsewhere classified: Secondary | ICD-10-CM

## 2014-10-31 DIAGNOSIS — R293 Abnormal posture: Secondary | ICD-10-CM

## 2014-10-31 DIAGNOSIS — D492 Neoplasm of unspecified behavior of bone, soft tissue, and skin: Secondary | ICD-10-CM | POA: Diagnosis not present

## 2014-10-31 DIAGNOSIS — C9000 Multiple myeloma not having achieved remission: Secondary | ICD-10-CM | POA: Diagnosis not present

## 2014-10-31 DIAGNOSIS — R279 Unspecified lack of coordination: Secondary | ICD-10-CM

## 2014-10-31 DIAGNOSIS — M2560 Stiffness of unspecified joint, not elsewhere classified: Secondary | ICD-10-CM | POA: Diagnosis not present

## 2014-10-31 NOTE — Therapy (Signed)
[facility] [address] [place], [place], [address] Phone: [phone]   Fax:  [phone]  Physical Therapy Treatment  Patient Details  Name: Jeremiah Owens MRN: [id number] Date of Birth: 07-Jun-1930 Referring Provider:  Wenda Low, MD  Encounter Date: 10/31/2014      PT End of Session - 10/31/14 1651    Visit Number 14   Number of Visits 33   Date for PT Re-Evaluation 12/29/14   Authorization Type Medicare [employer] Primary, [employer] secondary G codes required   PT Start Time 1450   PT Stop Time 1522  pt missed some of his therapy time due to requiring restroom break   PT Time Calculation (min) 32 min   Equipment Utilized During Treatment Gait belt   Activity Tolerance Patient tolerated treatment well   Behavior During Therapy [facility] for tasks assessed/performed      Past Medical History  Diagnosis Date  . Compression fracture   . Melanoma   . Bone cancer     t_spine T-5  . Prostate cancer 2002  . Skin cancer 1994    melanoma  right neck   . Multiple myeloma   . Allergy   . Anxiety   . Thyroid disease   . S/P radiation therapy 08/21/14-09/04/14    T4-6 25Gy/66f    Past Surgical History  Procedure Laterality Date  . Hernia repair    . Posterior cervical fusion/foraminotomy N/A 06/17/2014    Procedure: Thoracic five laminectomy for epidural tumor resection Thoracic 4-7 posterior lateral arthrodesis, segmental pedicle screw fixation.;  Surgeon: KAshok Pall MD;  [facility];  Service: Neurosurgery;  Laterality: N/A;    There were no vitals filed for this visit.  Visit Diagnosis:  Difficulty walking  Lack of coordination  Weakness of left leg  Posture abnormality  Activity intolerance  Decreased functional mobility and endurance      Subjective Assessment - 10/31/14 1454    Symptoms Pt was recently found to have cancerous lesions in the bones of the R arm. He would like to continue  working with PT as appropriate and cannot lift weight with the R arm. Also, this week is the off week for his chemo.   Currently in Pain? No/denies            [facility]14 0001    Ambulation/Gait   Gait velocity 1.95 ft/sec with RW   Standardized Balance Assessment   Standardized Balance Assessment Berg Balance Test   Berg Balance Test   Sit to Stand Able to stand without using hands and stabilize independently   Standing Unsupported Able to stand safely 2 minutes   Sitting with Back Unsupported but Feet Supported on Floor or Stool Able to sit safely and securely 2 minutes   Stand to Sit Sits safely with minimal use of hands   Transfers Able to transfer safely, minor use of hands   Standing Unsupported with Eyes Closed Able to stand 10 seconds with supervision   Standing Ubsupported with Feet Together Able to place feet together independently and stand for 1 minute with supervision   From Standing, Reach Forward with Outstretched Arm Can reach confidently >25 cm (10")   From Standing Position, Pick up Object from Floor Able to pick up shoe, needs supervision   From Standing Position, Turn to Look Behind Over each Shoulder Looks behind one side only/other side shows less weight shift  on second trial   Turn 360 Degrees Needs  close supervision or verbal cueing   Standing Unsupported, Alternately Place Feet on Step/Stool Able to complete 4 steps without aid or supervision  8 steps with supervision   Standing Unsupported, One Foot in [place] to plae foot ahead of the other independently and hold 30 seconds   Standing on One Leg Tries to lift leg/unable to hold 3 seconds but remains standing independently   Total Score 43   Timed Up and Go Test   TUG Normal TUG   Normal TUG (seconds) 14.83  with RW; and 21.75 without RW with MIN A to steady      Neuro re-ed: Completed Berg Balance Test  Gait training: Completed TUG with and without RW.  Gait training without  assistive device in parallel bars x5 laps with BUE hovering over bars with mild unsteadiness noted. Then gait training with CGA/MIN A while ambulating on indoor level surfaces without assistive device 2x100' with rest breaks as needed.  Gait speed with RW 1.95 ft/sec.   Pt would like to continue with PT as he believes he will continue to make progress. I agreed with patient about this.                       PT Short Term Goals - 10/31/14 1648    PT SHORT TERM GOAL #1   Title Pt will demonstrate independence with HEP Target: 10/06/14   Status Achieved   PT SHORT TERM GOAL #2   Title Pt will increase BERG score to 36/56 for improving balance.  Target: 10/06/14   Status Achieved   PT SHORT TERM GOAL #3   Title Pt will Decrease TUG time to 14.69 seconds for improving efficiency with functional mobility using a single point cane. Target 10/06/14   Status Not Met  18.75 on 10/12/14   PT SHORT TERM GOAL #4   Title Pt will ambulate independently on indoor level surfaces with cane.  Target: 10/06/14   Status Not Met  requires CGA and demonstrates inefficient gait pattern with cane   PT SHORT TERM GOAL #5   Title Pt will perform sit to stand without UE support, independently.   Target: 10/06/14   Status Not Met   Additional Short Term Goals   Additional Short Term Goals Yes   PT SHORT TERM GOAL #6   Title Pt will increase Berg Balance Test score to 46/56 for decreased fall risk. Target 12/01/14   Status New   PT SHORT TERM GOAL #7   Title Pt will decrease TUG time with LRAD to < 13.5 ft/sec for decreased fall risk. Target 12/01/14   PT SHORT TERM GOAL #8   Title Pt will increase gait speed to 2.32 ft/sec with LRAD. Target 12/01/14   PT SHORT TERM GOAL #9   TITLE Pt will ambulate on indoor level surfaces without assistive device with CGA x200' Target 12/01/14           PT Long Term Goals - 10/31/14 1455    PT LONG TERM GOAL #1   Title Pt will increase Berg score to 49/56 for  improved balance.  Target 12/29/14   Baseline extended target date to end of renewal   Status On-going  Scored 43/56 on 10/31/14   PT LONG TERM GOAL #2   Title Pt will ambulate independently on indoor level surfaces without assistive device. Target 12/29/14   Baseline extended target date to end of renewal   Status On-going   PT LONG TERM  GOAL #3   Title Pt will ambulate with suprervision on outdoor unlevel grass and concrete wih single point cane. Target 12/29/14   Baseline Revised to allow for single point cane and Extended target to end of renewal   Status On-going   PT LONG TERM GOAL #4   Title Pt will decrease TUG time to <13.5 seconds without assistive device for decreased fall risk and increased independence. TTarget 12/29/14   Baseline extended target to end of renewal   Status On-going  TUG with RW 14.83 seconds on 10/31/14   PT LONG TERM GOAL #5   Title Pt will increase gait speed to >2.63 ft/sec with LRAD for community ambulator status. Target 12/29/14   Baseline Revised to allow for assistive device and extended to end of renewal plan of care    Status --   PT LONG TERM GOAL #6   Title Increase FOTO ABC to 30% for improved balance conficdence. Target 12/29/14   Baseline extended to end of renewal plan of care   Status On-going               Plan - 10/31/14 1653    Clinical Impression Statement Pt is making significant progress now able to ambulate witnout assistive device with MIN A to steady. He continues to require the walker for independent household ambulation. In addition he made a significant improvement in his standardized balance test.  Renewal is being completed despite the fact that long term goals were not met as pt had a decline in function once he started a new chemotherapy drug. However, due to his consistent participation in therapy and in HEP, pt is making progress with balance, gait pattern and activity tolerance. Continue per updated/renewal plan of care.    Pt will benefit from skilled therapeutic intervention in order to improve on the following deficits Abnormal gait;Decreased balance;Difficulty walking;Decreased strength;Postural dysfunction   Rehab Potential Good   PT Frequency 2x / week   PT Duration 8 weeks   PT Treatment/Interventions ADLs/Self Care Home Management;Gait training;Therapeutic exercise;Patient/family education;Stair training;Balance training;Functional mobility training;Neuromuscular re-education;Manual techniques;Therapeutic activities;DME Instruction   PT Next Visit Plan Continue gait training witout AD and with cane.   Consulted and Agree with Plan of Care Patient        Problem List Patient Active Problem List   Diagnosis Date Noted  . Gait disorder 09/05/2014  . Multiple myeloma   . Paraplegia 06/21/2014  . Postoperative anemia due to acute blood loss 06/21/2014  . 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 Spine metastasis 06/17/2014  . Thoracic spine tumor 06/17/2014  . Metastatic bone cancer 06/15/2014   Delrae Sawyers, PT,DPT,NCS 10/31/2014 4:59 PM Phone [phone] FAX [phone]         [facility] [address] [place], [place], [address] Phone: [phone]   Fax:  [phone]

## 2014-10-31 NOTE — Therapy (Signed)
[address] [place], [place], [address] Phone: [phone]   Fax:  [phone]  Occupational Therapy Treatment  Patient Details  Name: Jeremiah Owens MRN: [id number] Date of Birth: 1929/08/11 Referring Provider:  Wenda Low, MD  Encounter Date: 11-18-2014    Past Medical History  Diagnosis Date  . Compression fracture   . Melanoma   . Bone cancer     t_spine T-5  . Prostate cancer 2002  . Skin cancer 1994    melanoma  right neck   . Multiple myeloma   . Allergy   . Anxiety   . Thyroid disease   . S/P radiation therapy 08/21/14-09/04/14    T4-6 25Gy/22f    Past Surgical History  Procedure Laterality Date  . Hernia repair    . Posterior cervical fusion/foraminotomy N/A 06/17/2014    Procedure: Thoracic five laminectomy for epidural tumor resection Thoracic 4-7 posterior lateral arthrodesis, segmental pedicle screw fixation.;  Surgeon: KAshok Pall MD;  [facility];  Service: Neurosurgery;  Laterality: N/A;    There were no vitals filed for this visit.  Visit Diagnosis:  Pain in joint, shoulder region, right                            OT Short Term Goals - [id number]    OT SHORT TERM GOAL #1   Title Independent w/ initial HEP for Lt shoulder (due 10/03/14)   Status Achieved   OT SHORT TERM GOAL #2   Title Pt to perform 10 min . of UE exercise w/o rest    Status Achieved   OT SHORT TERM GOAL #3   Title Pt to perform cooking task w/ supervision only using walker w/o LOB   Status Achieved           OT Long Term Goals - [id number]    OT LONG TERM GOAL #1   Title Independent w/ updated HEP prn (due 11/02/14)   Status Deferred  pt wih new lesions in R shoulder region   OT LONG TERM GOAL #2   Title Pt to perform dynamic standing tasks for 10 minutes or greater w/o LOB   Status Achieved   OT LONG TERM GOAL #3   Title Pt/family to verbally id  strategies for safety during [place]   Status Achieved               Plan - [id number]    Clinical Impression Statement Pt with new lesions in spine and RUE shoulder girdle and humerus.  Pt has met all other OT goals. After discussion with pt, decision made to d/c from OT at this time. Pt now undergoing chemo and possible radiation for pain mgmt in R shoulder and is at high risk for fracture.   Pt will benefit from skilled therapeutic intervention in order to improve on the following deficits (Retired) Decreased activity tolerance;Decreased balance;Decreased endurance;Decreased range of motion;Decreased strength;Impaired UE functional use;Improper body mechanics;Impaired tone   Rehab Potential Good   OT Frequency 2x / week   OT Duration 8 weeks   OT Treatment/Interventions Self-care/ADL training;Therapeutic exercise;Moist Heat;Neuromuscular education;Functional Mobility Training;Energy conservation;Patient/family education;DME and/or AE instruction;Passive range of motion;Therapeutic activities   Plan d/c from OT services given recent report of new lesions in R shoulder region.   Consulted and Agree with Plan of Care Patient          G-Codes - 0April 16, 2016  1554    Functional Limitation Mobility: Walking and moving around   Mobility: Walking and Moving Around Current Status [phone]) At least 1 percent but less than 20 percent impaired, limited or restricted   Mobility: Walking and Moving Around Goal Status [phone]) At least 1 percent but less than 20 percent impaired, limited or restricted   Mobility: Walking and Moving Around Discharge Status [phone]) At least 1 percent but less than 20 percent impaired, limited or restricted      Problem List Patient Active Problem List   Diagnosis Date Noted  . Gait disorder 09/05/2014  . Multiple myeloma   . Paraplegia 06/21/2014  . Postoperative anemia due to acute blood loss 06/21/2014  . 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 Spine  metastasis 06/17/2014  . Thoracic spine tumor 06/17/2014  . Metastatic bone cancer 06/15/2014   OCCUPATIONAL THERAPY DISCHARGE SUMMARY  Visits from Start of Care: 10  Current functional level related to goals / functional outcomes: See above goals   Remaining deficits: RUE pain, decreased ROM, decreased strength   Education / Equipment: HEP Plan: Patient agrees to discharge.  Patient goals were met. Patient is being discharged due to meeting the stated rehab goals.  ?????      Quay Burow, OTR/L 10/31/2014, 3:54 PM  [facility] [address] [place], [place], [address] Phone: [phone]   Fax:  [phone]

## 2014-11-01 ENCOUNTER — Telehealth: Payer: Self-pay | Admitting: *Deleted

## 2014-11-01 ENCOUNTER — Ambulatory Visit (HOSPITAL_BASED_OUTPATIENT_CLINIC_OR_DEPARTMENT_OTHER): Payer: Medicare Other

## 2014-11-01 ENCOUNTER — Other Ambulatory Visit (HOSPITAL_BASED_OUTPATIENT_CLINIC_OR_DEPARTMENT_OTHER): Payer: Medicare Other

## 2014-11-01 ENCOUNTER — Other Ambulatory Visit: Payer: Self-pay | Admitting: Medical Oncology

## 2014-11-01 DIAGNOSIS — C9000 Multiple myeloma not having achieved remission: Secondary | ICD-10-CM

## 2014-11-01 DIAGNOSIS — Z5112 Encounter for antineoplastic immunotherapy: Secondary | ICD-10-CM | POA: Diagnosis not present

## 2014-11-01 DIAGNOSIS — C9002 Multiple myeloma in relapse: Secondary | ICD-10-CM

## 2014-11-01 LAB — COMPREHENSIVE METABOLIC PANEL (CC13)
ALBUMIN: 3.4 g/dL — AB (ref 3.5–5.0)
ALT: 22 U/L (ref 0–55)
ANION GAP: 10 meq/L (ref 3–11)
AST: 16 U/L (ref 5–34)
Alkaline Phosphatase: 154 U/L — ABNORMAL HIGH (ref 40–150)
BILIRUBIN TOTAL: 0.62 mg/dL (ref 0.20–1.20)
BUN: 17.2 mg/dL (ref 7.0–26.0)
CALCIUM: 8.7 mg/dL (ref 8.4–10.4)
CO2: 27 meq/L (ref 22–29)
CREATININE: 0.9 mg/dL (ref 0.7–1.3)
Chloride: 103 mEq/L (ref 98–109)
EGFR: 79 mL/min/{1.73_m2} — ABNORMAL LOW (ref >90)
GLUCOSE: 112 mg/dL (ref 70–140)
POTASSIUM: 4.1 meq/L (ref 3.5–5.1)
Sodium: 139 mEq/L (ref 136–145)
TOTAL PROTEIN: 6.2 g/dL — AB (ref 6.4–8.3)

## 2014-11-01 LAB — CBC WITH DIFFERENTIAL/PLATELET
BASO%: 0 % (ref 0.0–2.0)
BASOS ABS: 0 10*3/uL (ref 0.0–0.1)
EOS ABS: 0.2 10*3/uL (ref 0.0–0.5)
EOS%: 3.9 % (ref 0.0–7.0)
HCT: 35.6 % — ABNORMAL LOW (ref 38.4–49.9)
HGB: 11.9 g/dL — ABNORMAL LOW (ref 13.0–17.1)
LYMPH#: 0.8 10*3/uL — AB (ref 0.9–3.3)
LYMPH%: 21.1 % (ref 14.0–49.0)
MCH: 32 pg (ref 27.2–33.4)
MCHC: 33.4 g/dL (ref 32.0–36.0)
MCV: 95.7 fL (ref 79.3–98.0)
MONO#: 0.6 10*3/uL (ref 0.1–0.9)
MONO%: 14.6 % — ABNORMAL HIGH (ref 0.0–14.0)
NEUT#: 2.3 10*3/uL (ref 1.5–6.5)
NEUT%: 60.4 % (ref 39.0–75.0)
PLATELETS: 147 10*3/uL (ref 140–400)
RBC: 3.72 10*6/uL — ABNORMAL LOW (ref 4.20–5.82)
RDW: 14.8 % — ABNORMAL HIGH (ref 11.0–14.6)
WBC: 3.8 10*3/uL — AB (ref 4.0–10.3)

## 2014-11-01 MED ORDER — BORTEZOMIB CHEMO SQ INJECTION 3.5 MG (2.5MG/ML)
1.3000 mg/m2 | Freq: Once | INTRAMUSCULAR | Status: AC
  Administered 2014-11-01: 2.5 mg via SUBCUTANEOUS
  Filled 2014-11-01: qty 2.5

## 2014-11-01 MED ORDER — ONDANSETRON HCL 8 MG PO TABS
ORAL_TABLET | ORAL | Status: AC
  Filled 2014-11-01: qty 1

## 2014-11-01 MED ORDER — SODIUM CHLORIDE 0.9 % IV SOLN
Freq: Once | INTRAVENOUS | Status: AC
  Administered 2014-11-01: 11:00:00 via INTRAVENOUS

## 2014-11-01 MED ORDER — ZOLEDRONIC ACID 4 MG/100ML IV SOLN
4.0000 mg | Freq: Once | INTRAVENOUS | Status: AC
  Administered 2014-11-01: 4 mg via INTRAVENOUS
  Filled 2014-11-01: qty 100

## 2014-11-01 MED ORDER — ONDANSETRON HCL 8 MG PO TABS
8.0000 mg | ORAL_TABLET | Freq: Once | ORAL | Status: AC
  Administered 2014-11-01: 8 mg via ORAL

## 2014-11-01 MED ORDER — LENALIDOMIDE 25 MG PO CAPS: 25.0000 mg | ORAL_CAPSULE | Freq: Every day | ORAL | Status: DC

## 2014-11-01 NOTE — Telephone Encounter (Signed)
I have scheduled appts and gave patient calendar

## 2014-11-01 NOTE — Patient Instructions (Signed)

## 2014-11-02 ENCOUNTER — Ambulatory Visit: Payer: Medicare Other

## 2014-11-02 ENCOUNTER — Encounter: Payer: Medicare Other | Admitting: Occupational Therapy

## 2014-11-02 DIAGNOSIS — R279 Unspecified lack of coordination: Secondary | ICD-10-CM

## 2014-11-02 DIAGNOSIS — Z7409 Other reduced mobility: Secondary | ICD-10-CM | POA: Diagnosis not present

## 2014-11-02 DIAGNOSIS — R6889 Other general symptoms and signs: Secondary | ICD-10-CM | POA: Diagnosis not present

## 2014-11-02 DIAGNOSIS — R262 Difficulty in walking, not elsewhere classified: Secondary | ICD-10-CM

## 2014-11-02 DIAGNOSIS — M2560 Stiffness of unspecified joint, not elsewhere classified: Secondary | ICD-10-CM | POA: Diagnosis not present

## 2014-11-02 DIAGNOSIS — C7951 Secondary malignant neoplasm of bone: Secondary | ICD-10-CM | POA: Diagnosis not present

## 2014-11-02 DIAGNOSIS — D492 Neoplasm of unspecified behavior of bone, soft tissue, and skin: Secondary | ICD-10-CM | POA: Diagnosis not present

## 2014-11-02 DIAGNOSIS — C9000 Multiple myeloma not having achieved remission: Secondary | ICD-10-CM | POA: Diagnosis not present

## 2014-11-02 NOTE — Therapy (Signed)
[facility] [address] [place], [place], [address] Phone: [phone]   Fax:  [phone]  Physical Therapy Treatment  Patient Details  Name: Jeremiah Owens MRN: [id number] Date of Birth: 1929/08/28 Referring Provider:  Wenda Low, MD  Encounter Date: 11/02/2014      PT End of Session - 11/02/14 1234    Visit Number 15   Number of Visits 33   Date for PT Re-Evaluation 12/29/14   Authorization Type Medicare [employer] Primary, [employer] secondary G codes required   PT Start Time 0850   PT Stop Time 0930   PT Time Calculation (min) 40 min      Past Medical History  Diagnosis Date  . Compression fracture   . Melanoma   . Bone cancer     t_spine T-5  . Prostate cancer 2002  . Skin cancer 1994    melanoma  right neck   . Multiple myeloma   . Allergy   . Anxiety   . Thyroid disease   . S/P radiation therapy 08/21/14-09/04/14    T4-6 25Gy/54f    Past Surgical History  Procedure Laterality Date  . Hernia repair    . Posterior cervical fusion/foraminotomy N/A 06/17/2014    Procedure: Thoracic five laminectomy for epidural tumor resection Thoracic 4-7 posterior lateral arthrodesis, segmental pedicle screw fixation.;  Surgeon: KAshok Pall MD;  [facility];  Service: Neurosurgery;  Laterality: N/A;    There were no vitals filed for this visit.  Visit Diagnosis:  Difficulty walking  Lack of coordination      Subjective Assessment - 11/02/14 0855    Symptoms Pt reports that he was originally feeling less and less back pain since his surgery, but more recently he has experienced occasionally dull pain in the back. He is wondering if that may e due to the cancer.   Currently in Pain? Yes   Pain Score 1    Pain Location Back   Pain Orientation Right;Upper   Pain Descriptors / Indicators Dull   Pain Type Chronic pain   Pain Onset 1 to 4 weeks ago      Gait training with single point cane x239'  with CGA initially, [facility]. Again x200' with single point cane with verbal cues to widen his BOS.   Gait training in parallel bars with and without BUE support with an abduction board to prevent scissoring, with mirror for visual cues and with verbal cues to inhibit posterior lean in swing phase and to promote larger steps. Also performed rocking forward and backward with staggered feet to improve preswing and knee extension in stance. Practiced with and without UE support.  Gait training on level surface again with cane x200' with improved preswing, improved knee extension in stance and improved base of support initially with CGA but with fatigue, pt's gait pattern became impaired again and required MOD A to steady.                           PT Short Term Goals - 10/31/14 1648    PT SHORT TERM GOAL #1   Title Pt will demonstrate independence with HEP Target: 10/06/14   Status Achieved   PT SHORT TERM GOAL #2   Title Pt will increase BERG score to 36/56 for improving balance.  Target: 10/06/14   Status Achieved   PT SHORT TERM GOAL #3   Title Pt will Decrease TUG time  to 14.69 seconds for improving efficiency with functional mobility using a single point cane. Target 10/06/14   Status Not Met  18.75 on 10/12/14   PT SHORT TERM GOAL #4   Title Pt will ambulate independently on indoor level surfaces with cane.  Target: 10/06/14   Status Not Met  requires CGA and demonstrates inefficient gait pattern with cane   PT SHORT TERM GOAL #5   Title Pt will perform sit to stand without UE support, independently.   Target: 10/06/14   Status Not Met   Additional Short Term Goals   Additional Short Term Goals Yes   PT SHORT TERM GOAL #6   Title Pt will increase Berg Balance Test score to 46/56 for decreased fall risk. Target 12/01/14   Status New   PT SHORT TERM GOAL #7   Title Pt will decrease TUG time with LRAD to < 13.5 ft/sec for decreased fall risk. Target  12/01/14   PT SHORT TERM GOAL #8   Title Pt will increase gait speed to 2.32 ft/sec with LRAD. Target 12/01/14   PT SHORT TERM GOAL #9   TITLE Pt will ambulate on indoor level surfaces without assistive device with CGA x200' Target 12/01/14           PT Long Term Goals - 10/31/14 1455    PT LONG TERM GOAL #1   Title Pt will increase Berg score to 49/56 for improved balance.  Target 12/29/14   Baseline extended target date to end of renewal   Status On-going  Scored 43/56 on 10/31/14   PT LONG TERM GOAL #2   Title Pt will ambulate independently on indoor level surfaces without assistive device. Target 12/29/14   Baseline extended target date to end of renewal   Status On-going   PT LONG TERM GOAL #3   Title Pt will ambulate with suprervision on outdoor unlevel grass and concrete wih single point cane. Target 12/29/14   Baseline Revised to allow for single point cane and Extended target to end of renewal   Status On-going   PT LONG TERM GOAL #4   Title Pt will decrease TUG time to <13.5 seconds without assistive device for decreased fall risk and increased independence. TTarget 12/29/14   Baseline extended target to end of renewal   Status On-going  TUG with RW 14.83 seconds on 10/31/14   PT LONG TERM GOAL #5   Title Pt will increase gait speed to >2.63 ft/sec with LRAD for community ambulator status. Target 12/29/14   Baseline Revised to allow for assistive device and extended to end of renewal plan of care    Status --   PT LONG TERM GOAL #6   Title Increase FOTO ABC to 30% for improved balance conficdence. Target 12/29/14   Baseline extended to end of renewal plan of care   Status On-going               Plan - 11/02/14 1234    Clinical Impression Statement Pt continues to require RW for safe ambulation with better gait pattern, but is making progress with cane during therapy sessions. He fatigues after ~100 or 150' of ambulation with cane but endurance is improving.   PT Next  Visit Plan glut strengthening (med/max/min); postural muscle strengthening        Problem List Patient Active Problem List   Diagnosis Date Noted  . Gait disorder 09/05/2014  . Multiple myeloma   . Paraplegia 06/21/2014  . Postoperative anemia due to  acute blood loss 06/21/2014  . 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 Spine metastasis 06/17/2014  . Thoracic spine tumor 06/17/2014  . Metastatic bone cancer 06/15/2014   Delrae Sawyers, PT,DPT,NCS 11/02/2014 12:45 PM Phone [phone] FAX [phone]         [facility] [address] [place], [place], [address] Phone: [phone]   Fax:  [phone]

## 2014-11-06 ENCOUNTER — Ambulatory Visit: Payer: Medicare Other | Attending: Physical Medicine & Rehabilitation

## 2014-11-06 DIAGNOSIS — M2560 Stiffness of unspecified joint, not elsewhere classified: Secondary | ICD-10-CM | POA: Diagnosis not present

## 2014-11-06 DIAGNOSIS — R262 Difficulty in walking, not elsewhere classified: Secondary | ICD-10-CM | POA: Diagnosis not present

## 2014-11-06 DIAGNOSIS — R293 Abnormal posture: Secondary | ICD-10-CM

## 2014-11-06 DIAGNOSIS — R29898 Other symptoms and signs involving the musculoskeletal system: Secondary | ICD-10-CM

## 2014-11-06 DIAGNOSIS — C7951 Secondary malignant neoplasm of bone: Secondary | ICD-10-CM | POA: Insufficient documentation

## 2014-11-06 DIAGNOSIS — D492 Neoplasm of unspecified behavior of bone, soft tissue, and skin: Secondary | ICD-10-CM | POA: Diagnosis not present

## 2014-11-06 DIAGNOSIS — R6889 Other general symptoms and signs: Secondary | ICD-10-CM | POA: Insufficient documentation

## 2014-11-06 DIAGNOSIS — C9000 Multiple myeloma not having achieved remission: Secondary | ICD-10-CM | POA: Diagnosis not present

## 2014-11-06 DIAGNOSIS — Z7409 Other reduced mobility: Secondary | ICD-10-CM | POA: Diagnosis not present

## 2014-11-06 DIAGNOSIS — M25511 Pain in right shoulder: Secondary | ICD-10-CM | POA: Insufficient documentation

## 2014-11-06 NOTE — Therapy (Signed)
[facility] [address] [place], [place], [address] Phone: [phone]   Fax:  [phone]  Physical Therapy Treatment  Patient Details  Name: Jeremiah Owens MRN: [id number] Date of Birth: 1930/06/13 Referring Provider:  Wenda Low, MD  Encounter Date: 11/06/2014      PT End of Session - 11/06/14 1232    Visit Number 16   Number of Visits 33   Date for PT Re-Evaluation 12/29/14   Authorization Type Medicare [employer], [employer] secondary G codes required   PT Start Time 0850   PT Stop Time 0930   PT Time Calculation (min) 40 min      Past Medical History  Diagnosis Date  . Compression fracture   . Melanoma   . Bone cancer     t_spine T-5  . Prostate cancer 2002  . Skin cancer 1994    melanoma  right neck   . Multiple myeloma   . Allergy   . Anxiety   . Thyroid disease   . S/P radiation therapy 08/21/14-09/04/14    T4-6 25Gy/71f    Past Surgical History  Procedure Laterality Date  . Hernia repair    . Posterior cervical fusion/foraminotomy N/A 06/17/2014    Procedure: Thoracic five laminectomy for epidural tumor resection Thoracic 4-7 posterior lateral arthrodesis, segmental pedicle screw fixation.;  Surgeon: KAshok Pall MD;  [facility];  Service: Neurosurgery;  Laterality: N/A;    There were no vitals filed for this visit.  Visit Diagnosis:  Weakness of left leg  Posture abnormality  Difficulty walking      Subjective Assessment - 11/06/14 0852    Subjective Feeling ready to get stronger today!   Currently in Pain? Yes   Pain Score 1    Pain Location Chest   Pain Descriptors / Indicators Sore  pt reports this is a side effect of the chemo     Therex: To improve lower extremity and core strength and thus improve posture and walking ability 3x15 clamshells each side, initially with MOD A to keep pelvis rolled forward to isolate gluteus medius. 3x15 bridge + hip abduction  with green theraband resistance 3x15 2x10 (each leg) single leg bridging initially with tactile cues to facilitate the bridge; verbal cues to prevent valsalva 5x to form fatigue (~1 minute each) isometric abdominal bracing with transverse abdominis activation+latissimus dorsi activation+gluteus medius activation                            PT Short Term Goals - 10/31/14 1648    PT SHORT TERM GOAL #1   Title Pt will demonstrate independence with HEP Target: 10/06/14   Status Achieved   PT SHORT TERM GOAL #2   Title Pt will increase BERG score to 36/56 for improving balance.  Target: 10/06/14   Status Achieved   PT SHORT TERM GOAL #3   Title Pt will Decrease TUG time to 14.69 seconds for improving efficiency with functional mobility using a single point cane. Target 10/06/14   Status Not Met  18.75 on 10/12/14   PT SHORT TERM GOAL #4   Title Pt will ambulate independently on indoor level surfaces with cane.  Target: 10/06/14   Status Not Met  requires CGA and demonstrates inefficient gait pattern with cane   PT SHORT TERM GOAL #5   Title Pt will perform sit to stand without UE support, independently.   Target: 10/06/14   Status  Not Met   Additional Short Term Goals   Additional Short Term Goals Yes   PT SHORT TERM GOAL #6   Title Pt will increase Berg Balance Test score to 46/56 for decreased fall risk. Target 12/01/14   Status New   PT SHORT TERM GOAL #7   Title Pt will decrease TUG time with LRAD to < 13.5 ft/sec for decreased fall risk. Target 12/01/14   PT SHORT TERM GOAL #8   Title Pt will increase gait speed to 2.32 ft/sec with LRAD. Target 12/01/14   PT SHORT TERM GOAL #9   TITLE Pt will ambulate on indoor level surfaces without assistive device with CGA x200' Target 12/01/14           PT Long Term Goals - 10/31/14 1455    PT LONG TERM GOAL #1   Title Pt will increase Berg score to 49/56 for improved balance.  Target 12/29/14   Baseline extended target date to  end of renewal   Status On-going  Scored 43/56 on 10/31/14   PT LONG TERM GOAL #2   Title Pt will ambulate independently on indoor level surfaces without assistive device. Target 12/29/14   Baseline extended target date to end of renewal   Status On-going   PT LONG TERM GOAL #3   Title Pt will ambulate with suprervision on outdoor unlevel grass and concrete wih single point cane. Target 12/29/14   Baseline Revised to allow for single point cane and Extended target to end of renewal   Status On-going   PT LONG TERM GOAL #4   Title Pt will decrease TUG time to <13.5 seconds without assistive device for decreased fall risk and increased independence. TTarget 12/29/14   Baseline extended target to end of renewal   Status On-going  TUG with RW 14.83 seconds on 10/31/14   PT LONG TERM GOAL #5   Title Pt will increase gait speed to >2.63 ft/sec with LRAD for community ambulator status. Target 12/29/14   Baseline Revised to allow for assistive device and extended to end of renewal plan of care    Status --   PT LONG TERM GOAL #6   Title Increase FOTO ABC to 30% for improved balance conficdence. Target 12/29/14   Baseline extended to end of renewal plan of care   Status On-going               Plan - 11/06/14 1232    Clinical Impression Statement Therapy session emphasized lower extremity and core strengthening today. Pt had been performing the clamshells incorrectly for his HEP so these were reviewed with correct performance today. Continue per POC   PT Next Visit Plan Continue with core (especially spinal extensor) and lower extremity (especially glut med) strengthening.        Problem List Patient Active Problem List   Diagnosis Date Noted  . Gait disorder 09/05/2014  . Multiple myeloma   . Paraplegia 06/21/2014  . Postoperative anemia due to acute blood loss 06/21/2014  . 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 Spine metastasis 06/17/2014  . Thoracic spine tumor 06/17/2014  .  Metastatic bone cancer 06/15/2014    Delrae Sawyers D 11/06/2014, 12:34 PM  [facility] [address] [place], [place], [address] Phone: [phone]   Fax:  [phone]

## 2014-11-07 ENCOUNTER — Encounter: Payer: Self-pay | Admitting: Physical Medicine & Rehabilitation

## 2014-11-07 ENCOUNTER — Encounter: Payer: Medicare Other | Attending: Physical Medicine & Rehabilitation | Admitting: Physical Medicine & Rehabilitation

## 2014-11-07 DIAGNOSIS — C9000 Multiple myeloma not having achieved remission: Secondary | ICD-10-CM | POA: Diagnosis not present

## 2014-11-07 DIAGNOSIS — R269 Unspecified abnormalities of gait and mobility: Secondary | ICD-10-CM | POA: Insufficient documentation

## 2014-11-07 DIAGNOSIS — G8220 Paraplegia, unspecified: Secondary | ICD-10-CM

## 2014-11-07 NOTE — Progress Notes (Signed)
Subjective:    Patient ID: Jeremiah Owens, male    DOB: 1930/07/23, 81 y.o.   MRN: [id number]  HPI   Mr. Riggenbach is back regarding his myeloma and associated spinal and bony involvement. He developed pain in his right arm/shoulder. He ultimately was found to have myeloma involvement in his humerus, ?scapula.  He is now on a new chemotherapy per oncology. He's also taking zometa and calcium supplement.  He continues work with therapy at neuro rehab.   He's having some mild chest wall pain but he's rarely using medication for pain control at this time.    Pain Inventory Average Pain 1 Pain Right Now 1 My pain is aching  In the last 24 hours, has pain interfered with the following? General activity 0 Relation with others 0 Enjoyment of life 0 What TIME of day is your pain at its worst? daytime Sleep (in general) Good  Pain is worse with: some activites Pain improves with: medication Relief from Meds: 3  Mobility walk without assistance  Function Do you have any goals in this area?  no  Neuro/Psych No problems in this area  Prior Studies Any changes since last visit?  no  Physicians involved in your care Any changes since last visit?  no   Family History  Problem Relation Age of Onset  . Cancer Mother     breast   History   Social History  . Marital Status: Married    Spouse Name: N/A  . Number of Children: N/A  . Years of Education: N/A   Social History Main Topics  . Smoking status: Never Smoker   . Smokeless tobacco: Not on file  . Alcohol Use: No  . Drug Use: No  . Sexual Activity: Not on file   Other Topics Concern  . None   Social History Narrative   Past Surgical History  Procedure Laterality Date  . Hernia repair    . Posterior cervical fusion/foraminotomy N/A 06/17/2014    Procedure: Thoracic five laminectomy for epidural tumor resection Thoracic 4-7 posterior lateral arthrodesis, segmental pedicle screw fixation.;  Surgeon: Ashok Pall,  MD;  Location: [facility];  Service: Neurosurgery;  Laterality: N/A;   Past Medical History  Diagnosis Date  . Compression fracture   . Melanoma   . Bone cancer     t_spine T-5  . Prostate cancer 2002  . Skin cancer 1994    melanoma  right neck   . Multiple myeloma   . Allergy   . Anxiety   . Thyroid disease   . S/P radiation therapy 08/21/14-09/04/14    T4-6 25Gy/70f   There were no vitals taken for this visit.  Opioid Risk Score:   Fall Risk Score: Moderate Fall Risk (6-13 points)`1  Depression screen PHQ 2/9  Depression screen PHQ 2/9 11/07/2014  Decreased Interest 0  Down, Depressed, Hopeless 0  PHQ - 2 Score 0  Altered sleeping 0  Tired, decreased energy 2  Change in appetite 0  Feeling bad or failure about yourself  0  Trouble concentrating 1  Moving slowly or fidgety/restless 0  Suicidal thoughts 0  PHQ-9 Score 3     Review of Systems  All other systems reviewed and are negative.      Objective:   Physical Exam   HENT: dentition good Head: Normocephalic.  Right Ear: External ear normal.  Left Ear: External ear normal.  Eyes: Conjunctivae and EOM are normal. Pupils are equal, round, and reactive to  light. Right eye exhibits no discharge. Left eye exhibits no discharge.  Neck: Normal range of motion. Neck supple. No JVD present. No tracheal deviation present. No thyromegaly present.  Cardiovascular: Normal rate, regular rhythm and normal heart sounds.  Respiratory: Effort normal and breath sounds normal. No respiratory distress. He has no wheezes. He has no rales. He exhibits no tenderness.  GI: Soft. Bowel sounds are normal. He exhibits no distension. There is no tenderness. There is no rebound.  Musculoskeletal:  Head forward posture still with sitting  Lymphadenopathy:   He has no cervical adenopathy.  Neurological: He is alert and oriented to person, place, and time.  Follows commands. No cn findings. Left shoulder limited due to pain/  tightness. Otherwise UE 5/5. LE: HF 4-5. KE 4/5, ADF/APF 4/5. Decreased LT/proprioception in both feet. No resting tone. gait still wide based, has difficulty with change in direction---loses balance posteriorly.  Stands with a kyphotic posture---improves with cueing.  Skin: Skin is warm.  Back incision is clean   Psychiatric: He has a normal mood and affect. His behavior is normal. Judgment and thought content normal  Assessment/Plan:  1. Functional deficits secondary to multiple myeloma to thoracic spine status post decompression and fusion T4-7 06/18/2014 -  Continue with neuro-rehab to improve stamina, balance, posture and to advance to cane. I encouraged more moderation than aggressive, resistance exercise. Probably a good idea to stay with walker for home and community ambulation at this time given his MM and associated treatments/fx's 2.Onc management per Dr. Earlie Server.  Further CTX and likely radiation.   3. Pain Management: Hydrocodone and Robaxin as needed. Overall seems improved -good posture/ROM  . Discussed driving and he's not appropriate to return to the wheel. Did encourage him to try som practice sessions with his wife in a empty parking lot to help judge his readiness to drive.  Follow up with me in 3 months. Thirty minutes of face to face patient care time were spent during this visit. All questions were encouraged and answered. Marland Kitchen

## 2014-11-07 NOTE — Patient Instructions (Signed)
PLEASE CALL ME WITH ANY PROBLEMS OR QUESTIONS (#[phone]).      

## 2014-11-08 ENCOUNTER — Other Ambulatory Visit (HOSPITAL_BASED_OUTPATIENT_CLINIC_OR_DEPARTMENT_OTHER): Payer: Medicare Other

## 2014-11-08 ENCOUNTER — Ambulatory Visit (HOSPITAL_BASED_OUTPATIENT_CLINIC_OR_DEPARTMENT_OTHER): Payer: Medicare Other

## 2014-11-08 DIAGNOSIS — C9000 Multiple myeloma not having achieved remission: Secondary | ICD-10-CM

## 2014-11-08 DIAGNOSIS — C9002 Multiple myeloma in relapse: Secondary | ICD-10-CM

## 2014-11-08 DIAGNOSIS — Z5112 Encounter for antineoplastic immunotherapy: Secondary | ICD-10-CM

## 2014-11-08 LAB — CBC WITH DIFFERENTIAL/PLATELET
BASO%: 0.7 % (ref 0.0–2.0)
BASOS ABS: 0 10*3/uL (ref 0.0–0.1)
EOS%: 5.3 % (ref 0.0–7.0)
Eosinophils Absolute: 0.2 10*3/uL (ref 0.0–0.5)
HEMATOCRIT: 36.1 % — AB (ref 38.4–49.9)
HGB: 11.8 g/dL — ABNORMAL LOW (ref 13.0–17.1)
LYMPH%: 8.4 % — AB (ref 14.0–49.0)
MCH: 31.6 pg (ref 27.2–33.4)
MCHC: 32.6 g/dL (ref 32.0–36.0)
MCV: 96.9 fL (ref 79.3–98.0)
MONO#: 0.3 10*3/uL (ref 0.1–0.9)
MONO%: 5.9 % (ref 0.0–14.0)
NEUT#: 3.6 10*3/uL (ref 1.5–6.5)
NEUT%: 79.7 % — AB (ref 39.0–75.0)
Platelets: 140 10*3/uL (ref 140–400)
RBC: 3.72 10*6/uL — ABNORMAL LOW (ref 4.20–5.82)
RDW: 14.8 % — AB (ref 11.0–14.6)
WBC: 4.5 10*3/uL (ref 4.0–10.3)
lymph#: 0.4 10*3/uL — ABNORMAL LOW (ref 0.9–3.3)

## 2014-11-08 LAB — COMPREHENSIVE METABOLIC PANEL (CC13)
ALK PHOS: 122 U/L (ref 40–150)
ALT: 19 U/L (ref 0–55)
AST: 18 U/L (ref 5–34)
Albumin: 3.5 g/dL (ref 3.5–5.0)
Anion Gap: 8 mEq/L (ref 3–11)
BILIRUBIN TOTAL: 0.65 mg/dL (ref 0.20–1.20)
BUN: 15.3 mg/dL (ref 7.0–26.0)
CO2: 29 mEq/L (ref 22–29)
CREATININE: 0.9 mg/dL (ref 0.7–1.3)
Calcium: 9 mg/dL (ref 8.4–10.4)
Chloride: 102 mEq/L (ref 98–109)
EGFR: 81 mL/min/{1.73_m2} — AB (ref >90)
GLUCOSE: 80 mg/dL (ref 70–140)
Potassium: 4.2 mEq/L (ref 3.5–5.1)
Sodium: 140 mEq/L (ref 136–145)
TOTAL PROTEIN: 6.2 g/dL — AB (ref 6.4–8.3)

## 2014-11-08 MED ORDER — BORTEZOMIB CHEMO SQ INJECTION 3.5 MG (2.5MG/ML)
1.3000 mg/m2 | Freq: Once | INTRAMUSCULAR | Status: AC
  Administered 2014-11-08: 2.5 mg via SUBCUTANEOUS
  Filled 2014-11-08: qty 2.5

## 2014-11-08 MED ORDER — ONDANSETRON HCL 8 MG PO TABS
8.0000 mg | ORAL_TABLET | Freq: Once | ORAL | Status: AC
  Administered 2014-11-08: 8 mg via ORAL

## 2014-11-08 MED ORDER — ONDANSETRON HCL 8 MG PO TABS
ORAL_TABLET | ORAL | Status: AC
  Filled 2014-11-08: qty 1

## 2014-11-08 NOTE — Patient Instructions (Signed)
[facility] Discharge Instructions for Patients Receiving Chemotherapy  Today you received the following chemotherapy agents:  Velcade  To help prevent nausea and vomiting after your treatment, we encourage you to take your nausea medication as prescribed.   If you develop nausea and vomiting that is not controlled by your nausea medication, call the clinic.   BELOW ARE SYMPTOMS THAT SHOULD BE REPORTED IMMEDIATELY:  *FEVER GREATER THAN 100.5 F  *CHILLS WITH OR WITHOUT FEVER  NAUSEA AND VOMITING THAT IS NOT CONTROLLED WITH YOUR NAUSEA MEDICATION  *UNUSUAL SHORTNESS OF BREATH  *UNUSUAL BRUISING OR BLEEDING  TENDERNESS IN MOUTH AND THROAT WITH OR WITHOUT PRESENCE OF ULCERS  *URINARY PROBLEMS  *BOWEL PROBLEMS  UNUSUAL RASH Items with * indicate a potential emergency and should be followed up as soon as possible.  Feel free to call the clinic you have any questions or concerns. The clinic phone number is [phone].  Please show the CHEMO ALERT CARD at check-in to the Emergency Department and triage nurse.   

## 2014-11-10 ENCOUNTER — Ambulatory Visit: Payer: Medicare Other

## 2014-11-10 DIAGNOSIS — R293 Abnormal posture: Secondary | ICD-10-CM

## 2014-11-10 DIAGNOSIS — R29898 Other symptoms and signs involving the musculoskeletal system: Secondary | ICD-10-CM

## 2014-11-10 DIAGNOSIS — C7951 Secondary malignant neoplasm of bone: Secondary | ICD-10-CM | POA: Diagnosis not present

## 2014-11-10 DIAGNOSIS — C9000 Multiple myeloma not having achieved remission: Secondary | ICD-10-CM | POA: Diagnosis not present

## 2014-11-10 DIAGNOSIS — M2560 Stiffness of unspecified joint, not elsewhere classified: Secondary | ICD-10-CM | POA: Diagnosis not present

## 2014-11-10 DIAGNOSIS — D492 Neoplasm of unspecified behavior of bone, soft tissue, and skin: Secondary | ICD-10-CM | POA: Diagnosis not present

## 2014-11-10 DIAGNOSIS — Z7409 Other reduced mobility: Secondary | ICD-10-CM | POA: Diagnosis not present

## 2014-11-10 DIAGNOSIS — R262 Difficulty in walking, not elsewhere classified: Secondary | ICD-10-CM

## 2014-11-10 DIAGNOSIS — R6889 Other general symptoms and signs: Secondary | ICD-10-CM | POA: Diagnosis not present

## 2014-11-10 NOTE — Therapy (Signed)
[facility] [address] [place], [place], [address] Phone: [phone]   Fax:  [phone]  Physical Therapy Treatment  Patient Details  Name: Jeremiah Owens MRN: [id number] Date of Birth: Aug 02, 1930 Referring Provider:  Wenda Low, MD  Encounter Date: 11/10/2014      PT End of Session - 11/10/14 1726    Visit Number 17   Number of Visits 33   Date for PT Re-Evaluation 12/29/14   Authorization Type Medicare [employer] Primary, [employer] secondary G codes required   PT Start Time 0806   PT Stop Time 0848   PT Time Calculation (min) 42 min      Past Medical History  Diagnosis Date  . Compression fracture   . Melanoma   . Bone cancer     t_spine T-5  . Prostate cancer 2002  . Skin cancer 1994    melanoma  right neck   . Multiple myeloma   . Allergy   . Anxiety   . Thyroid disease   . S/P radiation therapy 08/21/14-09/04/14    T4-6 25Gy/24f    Past Surgical History  Procedure Laterality Date  . Hernia repair    . Posterior cervical fusion/foraminotomy N/A 06/17/2014    Procedure: Thoracic five laminectomy for epidural tumor resection Thoracic 4-7 posterior lateral arthrodesis, segmental pedicle screw fixation.;  Surgeon: KAshok Pall MD;  Location: [facility];  Service: Neurosurgery;  Laterality: N/A;    There were no vitals filed for this visit.  Visit Diagnosis:  Weakness of left leg  Posture abnormality  Difficulty walking      Subjective Assessment - 11/10/14 0810    Subjective Pt reports feeling "pretty good"   Currently in Pain? Yes   Pain Score 1    Pain Location Chest   Pain Orientation Right;Upper   Pain Descriptors / Indicators Aching   Pain Type Chronic pain       Lateral pelvic tilts with facilitation of quadratus lumborum initially for proper coordination  Spinal extension 3x10 against ball  Seated hip abduction with green theraband resist 3x20 (2nd and 3rd sets with isolated  glut med)  Physioball circiels 10x clockwise, 10x counterclockwise Lateral shifts 10x each direction Pelvic tilts 20x each direction   Bilateral LE ball lift ~ 4" off mat, in supine for core strength 3x6  4x10 supine flutter kick with BLE with MIN A for core strength    Gait training x115' with cane with CGA initally and then MOD A as pt fatigued, verbal and visual cues to increase base of support. Very unsteady with final turn to sit; again x115' with MIN A and facilitation of glut medius in stance phase; again x230' with MIN A and glut med facilitation, with improved BOS and improved stability with turns                         PT Short Term Goals - 10/31/14 1648    PT SHORT TERM GOAL #1   Title Pt will demonstrate independence with HEP Target: 10/06/14   Status Achieved   PT SHORT TERM GOAL #2   Title Pt will increase BERG score to 36/56 for improving balance.  Target: 10/06/14   Status Achieved   PT SHORT TERM GOAL #3   Title Pt will Decrease TUG time to 14.69 seconds for improving efficiency with functional mobility using a single point cane. Target 10/06/14   Status Not Met  18.75 on 10/12/14  PT SHORT TERM GOAL #4   Title Pt will ambulate independently on indoor level surfaces with cane.  Target: 10/06/14   Status Not Met  requires CGA and demonstrates inefficient gait pattern with cane   PT SHORT TERM GOAL #5   Title Pt will perform sit to stand without UE support, independently.   Target: 10/06/14   Status Not Met   Additional Short Term Goals   Additional Short Term Goals Yes   PT SHORT TERM GOAL #6   Title Pt will increase Berg Balance Test score to 46/56 for decreased fall risk. Target 12/01/14   Status New   PT SHORT TERM GOAL #7   Title Pt will decrease TUG time with LRAD to < 13.5 ft/sec for decreased fall risk. Target 12/01/14   PT SHORT TERM GOAL #8   Title Pt will increase gait speed to 2.32 ft/sec with LRAD. Target 12/01/14   PT SHORT TERM GOAL #9    TITLE Pt will ambulate on indoor level surfaces without assistive device with CGA x200' Target 12/01/14           PT Long Term Goals - 10/31/14 1455    PT LONG TERM GOAL #1   Title Pt will increase Berg score to 49/56 for improved balance.  Target 12/29/14   Baseline extended target date to end of renewal   Status On-going  Scored 43/56 on 10/31/14   PT LONG TERM GOAL #2   Title Pt will ambulate independently on indoor level surfaces without assistive device. Target 12/29/14   Baseline extended target date to end of renewal   Status On-going   PT LONG TERM GOAL #3   Title Pt will ambulate with suprervision on outdoor unlevel grass and concrete wih single point cane. Target 12/29/14   Baseline Revised to allow for single point cane and Extended target to end of renewal   Status On-going   PT LONG TERM GOAL #4   Title Pt will decrease TUG time to <13.5 seconds without assistive device for decreased fall risk and increased independence. TTarget 12/29/14   Baseline extended target to end of renewal   Status On-going  TUG with RW 14.83 seconds on 10/31/14   PT LONG TERM GOAL #5   Title Pt will increase gait speed to >2.63 ft/sec with LRAD for community ambulator status. Target 12/29/14   Baseline Revised to allow for assistive device and extended to end of renewal plan of care    Status --   PT LONG TERM GOAL #6   Title Increase FOTO ABC to 30% for improved balance conficdence. Target 12/29/14   Baseline extended to end of renewal plan of care   Status On-going               Plan - 11/10/14 1727    Clinical Impression Statement Pt making notable progress with gait stability with cane. Not yet ready to walk with cane in home but expected to reach this level. Continue per plan of care.    PT Next Visit Plan Gait training with cane and balance training        Problem List Patient Active Problem List   Diagnosis Date Noted  . Gait disorder 09/05/2014  . Multiple myeloma   .  Paraplegia 06/21/2014  . Postoperative anemia due to acute blood loss 06/21/2014  . 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 Spine metastasis 06/17/2014  . Thoracic spine tumor 06/17/2014  . Metastatic bone cancer 06/15/2014   Anderson Malta  Jalisia Puchalski, PT,DPT,NCS 11/10/2014 5:32 PM Phone [phone] FAX [phone]         [facility] [address] [place], [place], [address] Phone: [phone]   Fax:  [phone]

## 2014-11-13 ENCOUNTER — Ambulatory Visit: Payer: Medicare Other

## 2014-11-13 DIAGNOSIS — M2560 Stiffness of unspecified joint, not elsewhere classified: Secondary | ICD-10-CM | POA: Diagnosis not present

## 2014-11-13 DIAGNOSIS — C9000 Multiple myeloma not having achieved remission: Secondary | ICD-10-CM | POA: Diagnosis not present

## 2014-11-13 DIAGNOSIS — D492 Neoplasm of unspecified behavior of bone, soft tissue, and skin: Secondary | ICD-10-CM | POA: Diagnosis not present

## 2014-11-13 DIAGNOSIS — R293 Abnormal posture: Secondary | ICD-10-CM

## 2014-11-13 DIAGNOSIS — R262 Difficulty in walking, not elsewhere classified: Secondary | ICD-10-CM

## 2014-11-13 DIAGNOSIS — C7951 Secondary malignant neoplasm of bone: Secondary | ICD-10-CM | POA: Diagnosis not present

## 2014-11-13 DIAGNOSIS — Z7409 Other reduced mobility: Secondary | ICD-10-CM | POA: Diagnosis not present

## 2014-11-13 DIAGNOSIS — R279 Unspecified lack of coordination: Secondary | ICD-10-CM

## 2014-11-13 DIAGNOSIS — R6889 Other general symptoms and signs: Secondary | ICD-10-CM | POA: Diagnosis not present

## 2014-11-13 NOTE — Therapy (Signed)
[facility] [address] [place], [place], [address] Phone: [phone]   Fax:  [phone]  Physical Therapy Treatment  Patient Details  Name: Jeremiah Owens MRN: [id number] Date of Birth: 07/18/1930 Referring Provider:  Wenda Low, MD  Encounter Date: 11/13/2014      PT End of Session - 11/13/14 1216    Visit Number 18   Number of Visits 33   Date for PT Re-Evaluation 12/29/14   Authorization Type Medicare [employer] Primary, [employer] secondary G codes required   PT Start Time 0938   PT Stop Time 1017   PT Time Calculation (min) 39 min      Past Medical History  Diagnosis Date  . Compression fracture   . Melanoma   . Bone cancer     t_spine T-5  . Prostate cancer 2002  . Skin cancer 1994    melanoma  right neck   . Multiple myeloma   . Allergy   . Anxiety   . Thyroid disease   . S/P radiation therapy 08/21/14-09/04/14    T4-6 25Gy/67f    Past Surgical History  Procedure Laterality Date  . Hernia repair    . Posterior cervical fusion/foraminotomy N/A 06/17/2014    Procedure: Thoracic five laminectomy for epidural tumor resection Thoracic 4-7 posterior lateral arthrodesis, segmental pedicle screw fixation.;  Surgeon: KAshok Pall MD;  [facility];  Service: Neurosurgery;  Laterality: N/A;    There were no vitals filed for this visit.  Visit Diagnosis:  Posture abnormality  Difficulty walking  Lack of coordination      Subjective Assessment - 11/13/14 0942    Subjective [place] with rollator and legs were a little sore. But the soreness has improved   Currently in Pain? No/denies           Pelvic tilts x20 anterior/posterior with emphasis on core strength-tactile cues required Lateral pelvic hiking x30 right and left with emphasis on core strength--tactile cues required   Gait training with cane x115' with tactile facilitation of gluteus  medius on each leg in stance, MOD A to steady and had to pause 3x due to  pt demonstrating impaired sequencing with cane, tangled cane on 2 ocassions. Again x115' with continued tactile cues and MIN A to steady, again x230' with 2 pauses for improving sequencing with MIN to MOD A to steady--assistance required increased during final 50' due to scissoring of legs.  Seated hamstring stretch 3x30 seconds each with verbal and tactile cues for posture/form  Standing calf stretch 3x30 seconds each verbal and tactile cues for form  Gait training again x230' with cane with tactile cues for glut med. With improved sequencing and balance, decreased scissoring only MIN A to steady, no rest breaks or pauses.  Standing balance x4 minutes with CGA: feet together, turning to look over shoulder, no UE support                   PT Short Term Goals - 10/31/14 1648    PT SHORT TERM GOAL #1   Title Pt will demonstrate independence with HEP Target: 10/06/14   Status Achieved   PT SHORT TERM GOAL #2   Title Pt will increase BERG score to 36/56 for improving balance.  Target: 10/06/14   Status Achieved   PT SHORT TERM GOAL #3   Title Pt will Decrease TUG time to 14.69 seconds for improving efficiency with functional mobility using  a single point cane. Target 10/06/14   Status Not Met  18.75 on 10/12/14   PT SHORT TERM GOAL #4   Title Pt will ambulate independently on indoor level surfaces with cane.  Target: 10/06/14   Status Not Met  requires CGA and demonstrates inefficient gait pattern with cane   PT SHORT TERM GOAL #5   Title Pt will perform sit to stand without UE support, independently.   Target: 10/06/14   Status Not Met   Additional Short Term Goals   Additional Short Term Goals Yes   PT SHORT TERM GOAL #6   Title Pt will increase Berg Balance Test score to 46/56 for decreased fall risk. Target 12/01/14   Status New   PT SHORT TERM GOAL #7   Title Pt will decrease TUG time with LRAD to < 13.5  ft/sec for decreased fall risk. Target 12/01/14   PT SHORT TERM GOAL #8   Title Pt will increase gait speed to 2.32 ft/sec with LRAD. Target 12/01/14   PT SHORT TERM GOAL #9   TITLE Pt will ambulate on indoor level surfaces without assistive device with CGA x200' Target 12/01/14           PT Long Term Goals - 10/31/14 1455    PT LONG TERM GOAL #1   Title Pt will increase Berg score to 49/56 for improved balance.  Target 12/29/14   Baseline extended target date to end of renewal   Status On-going  Scored 43/56 on 10/31/14   PT LONG TERM GOAL #2   Title Pt will ambulate independently on indoor level surfaces without assistive device. Target 12/29/14   Baseline extended target date to end of renewal   Status On-going   PT LONG TERM GOAL #3   Title Pt will ambulate with suprervision on outdoor unlevel grass and concrete wih single point cane. Target 12/29/14   Baseline Revised to allow for single point cane and Extended target to end of renewal   Status On-going   PT LONG TERM GOAL #4   Title Pt will decrease TUG time to <13.5 seconds without assistive device for decreased fall risk and increased independence. TTarget 12/29/14   Baseline extended target to end of renewal   Status On-going  TUG with RW 14.83 seconds on 10/31/14   PT LONG TERM GOAL #5   Title Pt will increase gait speed to >2.63 ft/sec with LRAD for community ambulator status. Target 12/29/14   Baseline Revised to allow for assistive device and extended to end of renewal plan of care    Status --   PT LONG TERM GOAL #6   Title Increase FOTO ABC to 30% for improved balance conficdence. Target 12/29/14   Baseline extended to end of renewal plan of care   Status On-going               Plan - 11/13/14 1216    Clinical Impression Statement Pt continues to make progress with gait stability with cane. Continues to fatigue after short distance demonstrating increased impairment with longer distances. Continue per plan of  care.    PT Next Visit Plan balance training (especially single limb stance), then gait training with cane        Problem List Patient Active Problem List   Diagnosis Date Noted  . Gait disorder 09/05/2014  . Multiple myeloma   . Paraplegia 06/21/2014  . Postoperative anemia due to acute blood loss 06/21/2014  . 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  Spine metastasis 06/17/2014  . Thoracic spine tumor 06/17/2014  . Metastatic bone cancer 06/15/2014    Delrae Sawyers D 11/13/2014, 12:19 PM  [facility] [address] [place], [place], [address] Phone: [phone]   Fax:  [phone]

## 2014-11-15 ENCOUNTER — Other Ambulatory Visit (HOSPITAL_BASED_OUTPATIENT_CLINIC_OR_DEPARTMENT_OTHER): Payer: Medicare Other

## 2014-11-15 ENCOUNTER — Telehealth: Payer: Self-pay | Admitting: Internal Medicine

## 2014-11-15 ENCOUNTER — Other Ambulatory Visit: Payer: Self-pay | Admitting: *Deleted

## 2014-11-15 ENCOUNTER — Ambulatory Visit: Payer: Medicare Other

## 2014-11-15 ENCOUNTER — Ambulatory Visit (HOSPITAL_BASED_OUTPATIENT_CLINIC_OR_DEPARTMENT_OTHER): Payer: Medicare Other | Admitting: Internal Medicine

## 2014-11-15 ENCOUNTER — Encounter: Payer: Self-pay | Admitting: Internal Medicine

## 2014-11-15 VITALS — BP 122/51 | HR 73 | Temp 97.4°F | Resp 18 | Ht 69.0 in | Wt 170.3 lb

## 2014-11-15 DIAGNOSIS — C9000 Multiple myeloma not having achieved remission: Secondary | ICD-10-CM | POA: Diagnosis not present

## 2014-11-15 DIAGNOSIS — C9002 Multiple myeloma in relapse: Secondary | ICD-10-CM

## 2014-11-15 DIAGNOSIS — R600 Localized edema: Secondary | ICD-10-CM

## 2014-11-15 DIAGNOSIS — M25511 Pain in right shoulder: Secondary | ICD-10-CM

## 2014-11-15 LAB — CBC WITH DIFFERENTIAL/PLATELET
BASO%: 0.7 % (ref 0.0–2.0)
BASOS ABS: 0 10*3/uL (ref 0.0–0.1)
EOS%: 10.6 % — ABNORMAL HIGH (ref 0.0–7.0)
Eosinophils Absolute: 0.5 10*3/uL (ref 0.0–0.5)
HCT: 36.3 % — ABNORMAL LOW (ref 38.4–49.9)
HEMOGLOBIN: 11.8 g/dL — AB (ref 13.0–17.1)
LYMPH#: 0.4 10*3/uL — AB (ref 0.9–3.3)
LYMPH%: 9.5 % — ABNORMAL LOW (ref 14.0–49.0)
MCH: 31.3 pg (ref 27.2–33.4)
MCHC: 32.6 g/dL (ref 32.0–36.0)
MCV: 95.9 fL (ref 79.3–98.0)
MONO#: 0.6 10*3/uL (ref 0.1–0.9)
MONO%: 13.5 % (ref 0.0–14.0)
NEUT#: 3 10*3/uL (ref 1.5–6.5)
NEUT%: 65.7 % (ref 39.0–75.0)
Platelets: 89 10*3/uL — ABNORMAL LOW (ref 140–400)
RBC: 3.78 10*6/uL — ABNORMAL LOW (ref 4.20–5.82)
RDW: 14.9 % — ABNORMAL HIGH (ref 11.0–14.6)
WBC: 4.5 10*3/uL (ref 4.0–10.3)

## 2014-11-15 LAB — COMPREHENSIVE METABOLIC PANEL (CC13)
ALBUMIN: 3.4 g/dL — AB (ref 3.5–5.0)
ALT: 16 U/L (ref 0–55)
AST: 18 U/L (ref 5–34)
Alkaline Phosphatase: 99 U/L (ref 40–150)
Anion Gap: 9 mEq/L (ref 3–11)
BUN: 15.6 mg/dL (ref 7.0–26.0)
CALCIUM: 8.2 mg/dL — AB (ref 8.4–10.4)
CHLORIDE: 104 meq/L (ref 98–109)
CO2: 26 meq/L (ref 22–29)
Creatinine: 0.8 mg/dL (ref 0.7–1.3)
EGFR: 81 mL/min/{1.73_m2} — AB (ref >90)
GLUCOSE: 102 mg/dL (ref 70–140)
POTASSIUM: 4 meq/L (ref 3.5–5.1)
SODIUM: 139 meq/L (ref 136–145)
Total Bilirubin: 0.78 mg/dL (ref 0.20–1.20)
Total Protein: 5.9 g/dL — ABNORMAL LOW (ref 6.4–8.3)

## 2014-11-15 MED ORDER — CEPHALEXIN 500 MG PO CAPS: 500.0000 mg | ORAL_CAPSULE | Freq: Three times a day (TID) | ORAL | Status: DC

## 2014-11-15 MED ORDER — FUROSEMIDE 20 MG PO TABS: 20.0000 mg | ORAL_TABLET | Freq: Every day | ORAL | Status: DC

## 2014-11-15 NOTE — Telephone Encounter (Signed)
gave and printed appt sched and avs for pt for April and May ....sed added tx. °

## 2014-11-15 NOTE — Telephone Encounter (Signed)
Spouse called from [facility].  Patient "seen today and is supposed to have Keflex and lasix prescribed for an infection in his leg.  [facility] has no record of these orders."  New order sent to [facility] at this time as Pharmacy name needed to be changed.  Was sent to [facility] order incidently.

## 2014-11-15 NOTE — Progress Notes (Signed)
[facility] [phone]   Fax:([phone]) [address] [employer] [address] [place] [address]  DIAGNOSIS: Multiple myeloma diagnosed in November 2015  PRIOR THERAPY:  1) Status post Thoracic five laminectomy for epidural tumor resection, Thoracic 4-7 posterior lateral arthrodesis,  segmental pedicle screw fixation T4-T7 Globus instrumentation under the care of Dr. Christella Noa on 06/18/2014. 2) Systemic chemotherapy with Velcade 1.3 MG/M2 subcutaneously weekly in addition to Decadron 40 mg by mouth on weekly basis. Status post 11 cycles.  CURRENT THERAPY:  1) Systemic chemotherapy with Velcade 1.3 MG/M2 subcutaneously weekly, Decadron 40 mg by mouth weekly in addition to Revlimid 25 MG by mouth daily for 21 days every 4 weeks, status post 1 cycle. 2) Zometa 4 mg IV every month. First dose 11/01/2014.  INTERVAL HISTORY: Jeremiah Owens 81 y.o. male returns to the clinic today for follow-up visit accompanied by his wife and son. The patient is currently on treatment with weekly subcutaneous Velcade, Revlimid and Decadron status post 1 cycle and tolerating the treatment fairly well. He is complaining of swelling and erythema of the lower extremities started few days ago. He has some vesicles on the chin of the left leg concerning for early infection. He denied having any significant nausea or vomiting, no fever or chills. He denied having any significant weight loss or night sweats. He denied having any significant chest pain, shortness of breath, cough or hemoptysis. He continues to complain of right shoulder pain recently and he had CT of the cervical and thoracic spine ordered by Dr. Christella Noa in addition to MRI of the right shoulder and humerus ordered by Dr. Rhona Raider that showed evidence for multiple lytic lesions in the same area consistent with the patient his history of multiple myeloma.  MEDICAL HISTORY: Past Medical  History  Diagnosis Date  . Compression fracture   . Melanoma   . Bone cancer     t_spine T-5  . Prostate cancer 2002  . Skin cancer 1994    melanoma  right neck   . Multiple myeloma   . Allergy   . Anxiety   . Thyroid disease   . S/P radiation therapy 08/21/14-09/04/14    T4-6 25Gy/78f    ALLERGIES:  is allergic to iodine.  MEDICATIONS:  Current Outpatient Prescriptions  Medication Sig Dispense Refill  . acyclovir (ZOVIRAX) 400 MG tablet TAKE 1 TABLET BY MOUTH TWICE A DAY 60 tablet 2  . Calcium Carbonate-Vitamin D (CALTRATE 600+D PO) Take 600 mg by mouth 2 (two) times daily.    . cholecalciferol (VITAMIN D) 1000 UNITS tablet Take 1,000 Units by mouth at bedtime.     .Marland Kitchendexamethasone (DECADRON) 4 MG tablet 10 tab every week, start with chemo 40 tablet 4  . hyaluronate sodium (RADIAPLEXRX) GEL Apply 1 application topically daily. Apply to treated area on skin after rad txs daily and prn    . lenalidomide (REVLIMID) 25 MG capsule Take 1 capsule (25 mg total) by mouth daily. 11/01/14 Authorization number= [id number] adult male 21 capsule 0  . levothyroxine (SYNTHROID, LEVOTHROID) 25 MCG tablet Take 1 tablet (25 mcg total) by mouth daily. 30 tablet 4  . methocarbamol (ROBAXIN) 500 MG tablet TAKE 1 TABLET (500 MG TOTAL) BY MOUTH EVERY 6 (SIX) HOURS AS NEEDED FOR MUSCLE SPASMS. 60 tablet 0  . Multiple Vitamins-Minerals (CENTRUM SILVER PO) Take 1 tablet by mouth daily.    . NON FORMULARY Prunes prn    .  omeprazole (PRILOSEC) 40 MG capsule Take 40 mg by mouth daily. In the afternoon  11  . Polyvinyl Alcohol-Povidone (REFRESH OP) Apply 1 drop to eye daily as needed (dry/irritated eyes).    . ranitidine (ZANTAC) 150 MG tablet Take 150 mg by mouth at bedtime.   11  . tamsulosin (FLOMAX) 0.4 MG CAPS capsule Take 1 capsule (0.4 mg total) by mouth at bedtime. 30 capsule 3  . warfarin (COUMADIN) 2 MG tablet Take 1 tablet (2 mg total) by mouth daily. 30 tablet 3  . HYDROcodone-acetaminophen (NORCO)  5-325 MG per tablet Take 1 tablet by mouth every 6 (six) hours as needed for moderate pain. (Patient not taking: Reported on 11/15/2014) 60 tablet 0  . HYDROCORTISONE, TOPICAL, 2.5 % SOLN Apply 1 application topically daily as needed.     . prochlorperazine (COMPAZINE) 10 MG tablet TAKE 1 TABLET BY MOUTH EVERY 6 HOURS AS NEEDED FOR NAUSEA AND VOMITING (Patient not taking: Reported on 11/15/2014) 30 tablet 0   No current facility-administered medications for this visit.    SURGICAL HISTORY:  Past Surgical History  Procedure Laterality Date  . Hernia repair    . Posterior cervical fusion/foraminotomy N/A 06/17/2014    Procedure: Thoracic five laminectomy for epidural tumor resection Thoracic 4-7 posterior lateral arthrodesis, segmental pedicle screw fixation.;  Surgeon: Ashok Pall, MD;  Location: [facility];  Service: Neurosurgery;  Laterality: N/A;    REVIEW OF SYSTEMS:  Constitutional: positive for fatigue Eyes: negative Ears, nose, mouth, throat, and face: negative Respiratory: negative Cardiovascular: negative Gastrointestinal: negative Genitourinary:negative Integument/breast: negative Hematologic/lymphatic: negative Musculoskeletal:positive for bone pain and muscle weakness Neurological: negative Behavioral/Psych: negative Endocrine: negative Allergic/Immunologic: negative   PHYSICAL EXAMINATION: General appearance: alert, cooperative, fatigued and no distress Head: Normocephalic, without obvious abnormality, atraumatic Neck: no adenopathy, no JVD, supple, symmetrical, trachea midline and thyroid not enlarged, symmetric, no tenderness/mass/nodules Lymph nodes: Cervical, supraclavicular, and axillary nodes normal. Resp: clear to auscultation bilaterally Back: symmetric, no curvature. ROM normal. No CVA tenderness. Cardio: regular rate and rhythm, S1, S2 normal, no murmur, click, rub or gallop GI: soft, non-tender; bowel sounds normal; no masses,  no  organomegaly Extremities: edema 2+ edema bilaterally with erythema on the legs Neurologic: Alert and oriented X 3, normal strength and tone. Normal symmetric reflexes. Normal coordination and gait Motor: grossly normal Weakness in the left foot  ECOG PERFORMANCE STATUS: 1 - Symptomatic but completely ambulatory  Blood pressure 122/51, pulse 73, temperature 97.4 F (36.3 C), temperature source Oral, resp. rate 18, height '5\' 9"'  (1.753 m), weight 170 lb 4.8 oz (77.248 kg), SpO2 100 %.  LABORATORY DATA: Lab Results  Component Value Date   WBC 4.5 11/15/2014   HGB 11.8* 11/15/2014   HCT 36.3* 11/15/2014   MCV 95.9 11/15/2014   PLT 89* 11/15/2014      Chemistry      Component Value Date/Time   NA 139 11/15/2014 0956   NA 133* 07/04/2014 0430   K 4.0 11/15/2014 0956   K 4.1 07/04/2014 0430   CL 97 07/04/2014 0430   CO2 26 11/15/2014 0956   CO2 27 07/04/2014 0430   BUN 15.6 11/15/2014 0956   BUN 18 07/04/2014 0430   CREATININE 0.8 11/15/2014 0956   CREATININE 0.90 07/04/2014 0430      Component Value Date/Time   CALCIUM 8.2* 11/15/2014 0956   CALCIUM 8.9 07/04/2014 0430   ALKPHOS 99 11/15/2014 0956   ALKPHOS 47 06/21/2014 0406   AST 18 11/15/2014 0956  AST 31 06/21/2014 0406   ALT 16 11/15/2014 0956   ALT 29 06/21/2014 0406   BILITOT 0.78 11/15/2014 0956   BILITOT <0.2* 06/21/2014 0406       RADIOGRAPHIC STUDIES: Ct Cervical Spine Wo Contrast  10/18/2014   CLINICAL DATA:  Multiple myeloma with failed remission. Neck pain, right shoulder pain, and mid thoracic pain. Thoracic surgery November 2015.  EXAM: CT CERVICAL SPINE WITHOUT CONTRAST  CT THORACIC SPINE WITHOUT CONTRAST  TECHNIQUE: Multidetector CT imaging of the cervical and thoracic spine was performed without contrast. Multiplanar CT image reconstructions were also generated.  COMPARISON:  Thoracic spine radiograph 10/12/2014. CT of the chest 06/17/2014. MRI of the thoracic spine 06/08/2014.  FINDINGS: CT CERVICAL  SPINE FINDINGS  Multiple lytic lesions are evident. A lesion within the right anterior body of C3 measures 8 x 6.5 x 6.5 mm. There is a breech of the cortex anteriorly  A lesion anteriorly within the C7 vertebral body is larger than on the prior studies. It now measures 12 x 10.5 x 11.5 mm.  A 5 mm lesion is present in the right inferior articulating facet of C4.  Multilevel degenerative changes are noted. There is fusion of posterior elements at C6-7. Exaggerated cervical lordosis is evident.  Moderate foraminal narrowing is present bilaterally at C3-4 due to facet hypertrophy and uncovertebral disease.  C4-5:  Moderate foraminal stenosis is present bilaterally.  C5-6:  Facet hypertrophy is present without significant stenosis.  C6-7:  Facet fusion is noted.  The foramina are patent.  C7-T1:  Negative.  CT THORACIC SPINE FINDINGS  Twelve rib-bearing thoracic type vertebral bodies are present. Patient is status post partial laminectomy at T4 and laminectomy at T5. Pedicle screws are present on the a left at T4 and T7 on the right at T4, T6, and T7. There is minimal lucency about the right T7 screw without evidence for hardware failure.  A lytic lesion anteriorly at T2 has enlarged, now measuring 18 x 17 x 13 mm.  A lesion in the posterior elements of T3 on the left measures 13 mm.  A significant ventral soft tissue component is present at the T5 level displacing the thecal sac and spinal cord posteriorly. This could be better resolved with MRI. There is significant destruction of the T5 vertebral body with tumor extending into the posterior elements bilaterally.  A lesion posteriorly at T6 has enlarged. The surrounds the right pedicle screw.  A lesion anteriorly at T7 has enlarged, now measuring 10 mm.  A new lesion within the left posterior aspect of T8 at the base the pedicle measures 10 x 6 mm.  A lesion in the posterior element is at T9 measures 10 mm maximally.  A lesion posteriorly within the T10 vertebral  body has expanded significantly, now measuring 17 x 11 x 17 mm.  An anterior lesion at L1 measures at least 16 mm.  Apart from the T5 lesion, there is no definite extraosseous extension of tumor.  Degenerative endplate changes and facet hypertrophy contribute to foraminal narrowing bilaterally at T10-11, worse on the right. There is mild foraminal narrowing bilaterally at T5-6 is well.  Endplate degenerative changes throughout thoracic spine contribute to exaggerated kyphosis, similar to the prior study.  IMPRESSION: 1. Progression of multiple lytic lesions throughout the cervical and thoracic spine as detailed above. 2. Laminectomy at T4 and T5 with posterior fusion T4-7. There is minimal loosening scratch the there is minimal lucency surrounding the right pedicle screw at T7. Hard were  otherwise appears to be intact. 3. Persistent extraosseous tumor at T5 with posterior displacement of the thecal sac and spinal cord. This could be better resolved with MRI. 4. Multilevel degenerative changes in both the cervical and thoracic spine as detailed above.   Electronically Signed   By: San Morelle M.D.   On: 10/18/2014 10:21   Ct Thoracic Spine Wo Contrast  10/18/2014   CLINICAL DATA:  Multiple myeloma with failed remission. Neck pain, right shoulder pain, and mid thoracic pain. Thoracic surgery November 2015.  EXAM: CT CERVICAL SPINE WITHOUT CONTRAST  CT THORACIC SPINE WITHOUT CONTRAST  TECHNIQUE: Multidetector CT imaging of the cervical and thoracic spine was performed without contrast. Multiplanar CT image reconstructions were also generated.  COMPARISON:  Thoracic spine radiograph 10/12/2014. CT of the chest 06/17/2014. MRI of the thoracic spine 06/08/2014.  FINDINGS: CT CERVICAL SPINE FINDINGS  Multiple lytic lesions are evident. A lesion within the right anterior body of C3 measures 8 x 6.5 x 6.5 mm. There is a breech of the cortex anteriorly  A lesion anteriorly within the C7 vertebral body is larger  than on the prior studies. It now measures 12 x 10.5 x 11.5 mm.  A 5 mm lesion is present in the right inferior articulating facet of C4.  Multilevel degenerative changes are noted. There is fusion of posterior elements at C6-7. Exaggerated cervical lordosis is evident.  Moderate foraminal narrowing is present bilaterally at C3-4 due to facet hypertrophy and uncovertebral disease.  C4-5:  Moderate foraminal stenosis is present bilaterally.  C5-6:  Facet hypertrophy is present without significant stenosis.  C6-7:  Facet fusion is noted.  The foramina are patent.  C7-T1:  Negative.  CT THORACIC SPINE FINDINGS  Twelve rib-bearing thoracic type vertebral bodies are present. Patient is status post partial laminectomy at T4 and laminectomy at T5. Pedicle screws are present on the a left at T4 and T7 on the right at T4, T6, and T7. There is minimal lucency about the right T7 screw without evidence for hardware failure.  A lytic lesion anteriorly at T2 has enlarged, now measuring 18 x 17 x 13 mm.  A lesion in the posterior elements of T3 on the left measures 13 mm.  A significant ventral soft tissue component is present at the T5 level displacing the thecal sac and spinal cord posteriorly. This could be better resolved with MRI. There is significant destruction of the T5 vertebral body with tumor extending into the posterior elements bilaterally.  A lesion posteriorly at T6 has enlarged. The surrounds the right pedicle screw.  A lesion anteriorly at T7 has enlarged, now measuring 10 mm.  A new lesion within the left posterior aspect of T8 at the base the pedicle measures 10 x 6 mm.  A lesion in the posterior element is at T9 measures 10 mm maximally.  A lesion posteriorly within the T10 vertebral body has expanded significantly, now measuring 17 x 11 x 17 mm.  An anterior lesion at L1 measures at least 16 mm.  Apart from the T5 lesion, there is no definite extraosseous extension of tumor.  Degenerative endplate changes and  facet hypertrophy contribute to foraminal narrowing bilaterally at T10-11, worse on the right. There is mild foraminal narrowing bilaterally at T5-6 is well.  Endplate degenerative changes throughout thoracic spine contribute to exaggerated kyphosis, similar to the prior study.  IMPRESSION: 1. Progression of multiple lytic lesions throughout the cervical and thoracic spine as detailed above. 2. Laminectomy at T4 and  T5 with posterior fusion T4-7. There is minimal loosening scratch the there is minimal lucency surrounding the right pedicle screw at T7. Hard were otherwise appears to be intact. 3. Persistent extraosseous tumor at T5 with posterior displacement of the thecal sac and spinal cord. This could be better resolved with MRI. 4. Multilevel degenerative changes in both the cervical and thoracic spine as detailed above.   Electronically Signed   By: San Morelle M.D.   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 He completed treatment with subcutaneous Velcade and Decadron status post 11 weekly doses. This was discontinued secondary to disease progression. The patient was started on treatment with weekly subcutaneous Velcade, Revlimid for 21 days every 4 weeks in addition to Decadron 40 mg weekly status post 1 cycle and he is tolerating it fairly well. He is currently undergoing cycle #2. I recommended for the patient to hold his treatment for this week until improvement in the lower extremity edema and questionable early cellulitis. He will resume his treatment next week. For the lower extremity edema and early cellulitis, I started the patient on Keflex 500 mg by mouth 3 times a day for 7 days in addition to Lasix 20 mg by mouth daily as needed for swelling. For the right shoulder pain, I referred the  patient to Dr. Lisbeth Renshaw for evaluation and consideration of palliative radiotherapy to the lytic bone lesions in this area. He will continue his current pain medications. He was advised to call immediately if he has any concerning symptoms in the interval. The patient voices understanding of current disease status and treatment options and is in agreement with the current care plan.  All questions were answered. The patient knows to call the clinic with any problems, questions or concerns. We can certainly see the patient much sooner if necessary.  Disclaimer: This note was dictated with voice recognition software. Similar sounding words can inadvertently be transcribed and may not be corrected upon review.

## 2014-11-16 ENCOUNTER — Ambulatory Visit: Payer: Medicare Other

## 2014-11-16 VITALS — BP 107/58 | HR 78

## 2014-11-16 DIAGNOSIS — M2560 Stiffness of unspecified joint, not elsewhere classified: Secondary | ICD-10-CM | POA: Diagnosis not present

## 2014-11-16 DIAGNOSIS — C7951 Secondary malignant neoplasm of bone: Secondary | ICD-10-CM | POA: Diagnosis not present

## 2014-11-16 DIAGNOSIS — D492 Neoplasm of unspecified behavior of bone, soft tissue, and skin: Secondary | ICD-10-CM | POA: Diagnosis not present

## 2014-11-16 DIAGNOSIS — R6889 Other general symptoms and signs: Secondary | ICD-10-CM | POA: Diagnosis not present

## 2014-11-16 DIAGNOSIS — R29898 Other symptoms and signs involving the musculoskeletal system: Secondary | ICD-10-CM

## 2014-11-16 DIAGNOSIS — R262 Difficulty in walking, not elsewhere classified: Secondary | ICD-10-CM

## 2014-11-16 DIAGNOSIS — Z7409 Other reduced mobility: Secondary | ICD-10-CM | POA: Diagnosis not present

## 2014-11-16 DIAGNOSIS — R279 Unspecified lack of coordination: Secondary | ICD-10-CM

## 2014-11-16 DIAGNOSIS — C9000 Multiple myeloma not having achieved remission: Secondary | ICD-10-CM | POA: Diagnosis not present

## 2014-11-16 NOTE — Therapy (Signed)
[facility] [address] [place], [place], [address] Phone: [phone]   Fax:  [phone]  Physical Therapy Treatment  Patient Details  Name: Jeremiah Owens MRN: [id number] Date of Birth: [birth year] Referring Provider:  Wenda Low, MD  Encounter Date: 11/16/2014      PT End of Session - 11/16/14 1206    Visit Number 19   Number of Visits 33   Date for PT Re-Evaluation 12/29/14   Authorization Type Medicare [employer], [employer] secondary G codes required   PT Start Time 0845   PT Stop Time 0930   PT Time Calculation (min) 45 min   Equipment Utilized During Treatment Gait belt   Activity Tolerance Patient tolerated treatment well      Past Medical History  Diagnosis Date  . Compression fracture   . Melanoma   . Bone cancer     t_spine T-5  . Prostate cancer 2002  . Skin cancer 1994    melanoma  right neck   . Multiple myeloma   . Allergy   . Anxiety   . Thyroid disease   . S/P radiation therapy 08/21/14-09/04/14    T4-6 25Gy/59f    Past Surgical History  Procedure Laterality Date  . Hernia repair    . Posterior cervical fusion/foraminotomy N/A 06/17/2014    Procedure: Thoracic five laminectomy for epidural tumor resection Thoracic 4-7 posterior lateral arthrodesis, segmental pedicle screw fixation.;  Surgeon: KAshok Pall MD;  [facility];  Service: Neurosurgery;  Laterality: N/A;    Filed Vitals:   11/16/14 0855  BP: 107/58  Pulse: 78    Visit Diagnosis:  Difficulty walking  Lack of coordination  Weakness of left leg      Subjective Assessment - 11/16/14 0855    Subjective Pt reports he has an infection in his legs and just started on antibiotics last night. His doctor took him off of the chemo until the infection is cleared. Pt would like to continue with PT at this time.    Currently in Pain? Yes   Pain Score 1    Pain Location Chest   Pain Orientation  Anterior;Posterior   Pain Descriptors / Indicators Aching   Pain Type Chronic pain       Seated hip abduction with green theraband 2x20  Neuro re-ed:  Single limb stance with MIN A to Supervision and intermittent UE support each leg, tactile cues for glut med activation, performed bouts on each leg  3 point star drill with CGA and fingertip support 1 bout each leg to form fatigue-mirror for visual cue of correct form  Foot taps on stairs with single limb stance on solid surface with BUE support, then single UE support, then without UE support with MIN A to steady, then progressed to perform on airex foam    Gait training: 2x115' and 1x230' with single point cane and assistance ranging from CGA to MIN A with tactile cues for glut med in stance. Pt had one episode of BLE scissoring but otherwise is demonstrating improved posture, weight shift and base of support and balance with ambulation. Continue per plan of care.                      PT Short Term Goals - 10/31/14 1648    PT SHORT TERM GOAL #1   Title Pt will demonstrate independence with HEP Target: 10/06/14   Status Achieved   PT SHORT TERM GOAL #2  Title Pt will increase BERG score to 36/56 for improving balance.  Target: 10/06/14   Status Achieved   PT SHORT TERM GOAL #3   Title Pt will Decrease TUG time to 14.69 seconds for improving efficiency with functional mobility using a single point cane. Target 10/06/14   Status Not Met  18.75 on 10/12/14   PT SHORT TERM GOAL #4   Title Pt will ambulate independently on indoor level surfaces with cane.  Target: 10/06/14   Status Not Met  requires CGA and demonstrates inefficient gait pattern with cane   PT SHORT TERM GOAL #5   Title Pt will perform sit to stand without UE support, independently.   Target: 10/06/14   Status Not Met   Additional Short Term Goals   Additional Short Term Goals Yes   PT SHORT TERM GOAL #6   Title Pt will increase Berg Balance Test score to  46/56 for decreased fall risk. Target 12/01/14   Status New   PT SHORT TERM GOAL #7   Title Pt will decrease TUG time with LRAD to < 13.5 ft/sec for decreased fall risk. Target 12/01/14   PT SHORT TERM GOAL #8   Title Pt will increase gait speed to 2.32 ft/sec with LRAD. Target 12/01/14   PT SHORT TERM GOAL #9   TITLE Pt will ambulate on indoor level surfaces without assistive device with CGA x200' Target 12/01/14           PT Long Term Goals - 10/31/14 1455    PT LONG TERM GOAL #1   Title Pt will increase Berg score to 49/56 for improved balance.  Target 12/29/14   Baseline extended target date to end of renewal   Status On-going  Scored 43/56 on 10/31/14   PT LONG TERM GOAL #2   Title Pt will ambulate independently on indoor level surfaces without assistive device. Target 12/29/14   Baseline extended target date to end of renewal   Status On-going   PT LONG TERM GOAL #3   Title Pt will ambulate with suprervision on outdoor unlevel grass and concrete wih single point cane. Target 12/29/14   Baseline Revised to allow for single point cane and Extended target to end of renewal   Status On-going   PT LONG TERM GOAL #4   Title Pt will decrease TUG time to <13.5 seconds without assistive device for decreased fall risk and increased independence. TTarget 12/29/14   Baseline extended target to end of renewal   Status On-going  TUG with RW 14.83 seconds on 10/31/14   PT LONG TERM GOAL #5   Title Pt will increase gait speed to >2.63 ft/sec with LRAD for community ambulator status. Target 12/29/14   Baseline Revised to allow for assistive device and extended to end of renewal plan of care    Status --   PT LONG TERM GOAL #6   Title Increase FOTO ABC to 30% for improved balance conficdence. Target 12/29/14   Baseline extended to end of renewal plan of care   Status On-going               Plan - 11/16/14 1207    Clinical Impression Statement Making progress with ambulation with single  point cane with decreased scissoring, but continues to require assist. Continue per plan of care   PT Next Visit Plan Cane training for most of session, then balance training        Problem List Patient Active Problem List   Diagnosis  Date Noted  . Gait disorder 09/05/2014  . Multiple myeloma   . Paraplegia 06/21/2014  . Postoperative anemia due to acute blood loss 06/21/2014  . 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 Spine metastasis 06/17/2014  . Thoracic spine tumor 06/17/2014  . Metastatic bone cancer 06/15/2014   Delrae Sawyers, PT,DPT,NCS 11/16/2014 12:12 PM Phone [phone] FAX [phone]         [facility] [address] [place], [place], [address] Phone: [phone]   Fax:  [phone]

## 2014-11-20 ENCOUNTER — Ambulatory Visit: Payer: Medicare Other

## 2014-11-20 DIAGNOSIS — Z1389 Encounter for screening for other disorder: Secondary | ICD-10-CM | POA: Diagnosis not present

## 2014-11-20 DIAGNOSIS — E039 Hypothyroidism, unspecified: Secondary | ICD-10-CM | POA: Diagnosis not present

## 2014-11-20 DIAGNOSIS — R279 Unspecified lack of coordination: Secondary | ICD-10-CM

## 2014-11-20 DIAGNOSIS — R600 Localized edema: Secondary | ICD-10-CM | POA: Diagnosis not present

## 2014-11-20 DIAGNOSIS — C9000 Multiple myeloma not having achieved remission: Secondary | ICD-10-CM | POA: Diagnosis not present

## 2014-11-20 DIAGNOSIS — R7309 Other abnormal glucose: Secondary | ICD-10-CM | POA: Diagnosis not present

## 2014-11-20 DIAGNOSIS — R6889 Other general symptoms and signs: Secondary | ICD-10-CM | POA: Diagnosis not present

## 2014-11-20 DIAGNOSIS — K219 Gastro-esophageal reflux disease without esophagitis: Secondary | ICD-10-CM | POA: Diagnosis not present

## 2014-11-20 DIAGNOSIS — C7951 Secondary malignant neoplasm of bone: Secondary | ICD-10-CM | POA: Diagnosis not present

## 2014-11-20 DIAGNOSIS — M2560 Stiffness of unspecified joint, not elsewhere classified: Secondary | ICD-10-CM | POA: Diagnosis not present

## 2014-11-20 DIAGNOSIS — D696 Thrombocytopenia, unspecified: Secondary | ICD-10-CM | POA: Diagnosis not present

## 2014-11-20 DIAGNOSIS — Z0000 Encounter for general adult medical examination without abnormal findings: Secondary | ICD-10-CM | POA: Diagnosis not present

## 2014-11-20 DIAGNOSIS — D492 Neoplasm of unspecified behavior of bone, soft tissue, and skin: Secondary | ICD-10-CM | POA: Diagnosis not present

## 2014-11-20 DIAGNOSIS — C61 Malignant neoplasm of prostate: Secondary | ICD-10-CM | POA: Diagnosis not present

## 2014-11-20 DIAGNOSIS — Z7409 Other reduced mobility: Secondary | ICD-10-CM | POA: Diagnosis not present

## 2014-11-20 DIAGNOSIS — Z23 Encounter for immunization: Secondary | ICD-10-CM | POA: Diagnosis not present

## 2014-11-20 DIAGNOSIS — R262 Difficulty in walking, not elsewhere classified: Secondary | ICD-10-CM

## 2014-11-20 NOTE — Therapy (Signed)
[facility] [address] [place], [place], [address] Phone: [phone]   Fax:  [phone]  Patient Details  Name: Jeremiah Owens MRN: [id number] Date of Birth: 14-Mar-1930 Referring Provider:  Wenda Low, MD  Encounter Date: 11/20/2014  TUG: 16.65 seconds with RW, 13.59 and unsteadiness with cane and CGA, then 19.84 with cane safely with supervision.  Multiple trials of TUG, untimed to practice controlled turns and controlled sitting after turning CGA initially then supervision. Pt noted to pivot on feet rather than clearing feet to turn.   Turning training while ambulating with cane, walking on indoor level surface with 180 degree turns, repeatedly with CGA and intermittent supervision.   Countertop foot clearance activities: High knee marching x1 lap Lateral high knee marching--added to HEP x3 laps with BUE support Stepping over 1"x4" obstacle forward and lateral   Gait training x230' and later x345' with cane and CGA/intermittent supervision with verbal cues to increase LLE step length.  Delrae Sawyers, PT,DPT,NCS 11/20/2014 2:20 PM Phone [phone] FAX [phone]          [facility] [address] [place], [place], [address] Phone: [phone]   Fax:  [phone]

## 2014-11-21 ENCOUNTER — Telehealth: Payer: Self-pay | Admitting: *Deleted

## 2014-11-21 NOTE — Telephone Encounter (Signed)
TC from [facility] regarding 2 medications-Lasix and Keflex. [facility] wanted to know if these were filled at local retail pharmacy. Informed them that pt did get these filled @ [facility] on 11/15/14.

## 2014-11-22 ENCOUNTER — Other Ambulatory Visit (HOSPITAL_BASED_OUTPATIENT_CLINIC_OR_DEPARTMENT_OTHER): Payer: Medicare Other

## 2014-11-22 ENCOUNTER — Ambulatory Visit: Payer: Medicare Other

## 2014-11-22 ENCOUNTER — Ambulatory Visit
Admission: RE | Admit: 2014-11-22 | Discharge: 2014-11-22 | Disposition: A | Discharge status: Home / Self Care | Payer: Medicare Other | Source: Ambulatory Visit | Attending: Radiation Oncology | Admitting: Radiation Oncology

## 2014-11-22 ENCOUNTER — Encounter: Payer: Self-pay | Admitting: Radiation Oncology

## 2014-11-22 ENCOUNTER — Ambulatory Visit (HOSPITAL_BASED_OUTPATIENT_CLINIC_OR_DEPARTMENT_OTHER): Payer: Medicare Other

## 2014-11-22 VITALS — BP 110/50 | HR 74 | Temp 98.2°F | Wt 170.0 lb

## 2014-11-22 VITALS — BP 118/49 | HR 70 | Temp 97.8°F

## 2014-11-22 DIAGNOSIS — C9000 Multiple myeloma not having achieved remission: Secondary | ICD-10-CM

## 2014-11-22 DIAGNOSIS — C419 Malignant neoplasm of bone and articular cartilage, unspecified: Secondary | ICD-10-CM

## 2014-11-22 DIAGNOSIS — Z5112 Encounter for antineoplastic immunotherapy: Secondary | ICD-10-CM | POA: Diagnosis present

## 2014-11-22 DIAGNOSIS — C7951 Secondary malignant neoplasm of bone: Secondary | ICD-10-CM | POA: Insufficient documentation

## 2014-11-22 DIAGNOSIS — C9002 Multiple myeloma in relapse: Secondary | ICD-10-CM

## 2014-11-22 DIAGNOSIS — Z510 Encounter for antineoplastic radiation therapy: Secondary | ICD-10-CM | POA: Diagnosis not present

## 2014-11-22 LAB — COMPREHENSIVE METABOLIC PANEL (CC13)
ALT: 20 U/L (ref 0–55)
AST: 19 U/L (ref 5–34)
Albumin: 3.5 g/dL (ref 3.5–5.0)
Alkaline Phosphatase: 83 U/L (ref 40–150)
Anion Gap: 9 mEq/L (ref 3–11)
BILIRUBIN TOTAL: 0.53 mg/dL (ref 0.20–1.20)
BUN: 16.7 mg/dL (ref 7.0–26.0)
CHLORIDE: 104 meq/L (ref 98–109)
CO2: 26 meq/L (ref 22–29)
Calcium: 8.6 mg/dL (ref 8.4–10.4)
Creatinine: 0.9 mg/dL (ref 0.7–1.3)
EGFR: 81 mL/min/{1.73_m2} — ABNORMAL LOW (ref >90)
Glucose: 104 mg/dl (ref 70–140)
Potassium: 4.3 mEq/L (ref 3.5–5.1)
SODIUM: 140 meq/L (ref 136–145)
Total Protein: 6.1 g/dL — ABNORMAL LOW (ref 6.4–8.3)

## 2014-11-22 LAB — CBC WITH DIFFERENTIAL/PLATELET
BASO%: 1 % (ref 0.0–2.0)
BASOS ABS: 0 10*3/uL (ref 0.0–0.1)
EOS ABS: 0.4 10*3/uL (ref 0.0–0.5)
EOS%: 11.2 % — ABNORMAL HIGH (ref 0.0–7.0)
HCT: 36.3 % — ABNORMAL LOW (ref 38.4–49.9)
HGB: 11.8 g/dL — ABNORMAL LOW (ref 13.0–17.1)
LYMPH#: 0.4 10*3/uL — AB (ref 0.9–3.3)
LYMPH%: 12 % — ABNORMAL LOW (ref 14.0–49.0)
MCH: 31.2 pg (ref 27.2–33.4)
MCHC: 32.5 g/dL (ref 32.0–36.0)
MCV: 96 fL (ref 79.3–98.0)
MONO#: 0.4 10*3/uL (ref 0.1–0.9)
MONO%: 9.6 % (ref 0.0–14.0)
NEUT#: 2.4 10*3/uL (ref 1.5–6.5)
NEUT%: 66.2 % (ref 39.0–75.0)
Platelets: 130 10*3/uL — ABNORMAL LOW (ref 140–400)
RBC: 3.78 10*6/uL — ABNORMAL LOW (ref 4.20–5.82)
RDW: 15 % — AB (ref 11.0–14.6)
WBC: 3.7 10*3/uL — ABNORMAL LOW (ref 4.0–10.3)

## 2014-11-22 MED ORDER — ONDANSETRON HCL 8 MG PO TABS
8.0000 mg | ORAL_TABLET | Freq: Once | ORAL | Status: AC
  Administered 2014-11-22: 8 mg via ORAL

## 2014-11-22 MED ORDER — BORTEZOMIB CHEMO SQ INJECTION 3.5 MG (2.5MG/ML)
1.3000 mg/m2 | Freq: Once | INTRAMUSCULAR | Status: AC
  Administered 2014-11-22: 2.5 mg via SUBCUTANEOUS
  Filled 2014-11-22: qty 2.5

## 2014-11-22 MED ORDER — ONDANSETRON HCL 8 MG PO TABS
ORAL_TABLET | ORAL | Status: AC
  Filled 2014-11-22: qty 1

## 2014-11-22 NOTE — Progress Notes (Signed)
Radiation Oncology         [phone] ________________________________  Name: Jeremiah Owens MRN: [id number]  Date: 11/22/2014  DOB: May 01, 1930  Follow-Up Visit Note  CC: Jeremiah Low, MD  Jeremiah Bears, MD  Diagnosis:   Multiple myeloma  Interval Since Last Radiation:  09/04/14 radiation to the thoracic spine.  Narrative:  The patient returns today for routine follow-up.  The patient has been continuing with systemic treatment through medical oncology. He states that his blood work has indicated some progression of disease which has prompted a change in his systemic treatment. He denies any difficulties in terms of recovering from surgery or radiation treatment.  Pt has pain radiating across the chest, his upper back, and right shoulder. Right shoulder is location of cancer. Pain for the last 2-3 wks. Tx going well. Pt experienced left leg swelling and infection. Didn't get tx last week because of the swelling and infection.  ALLERGIES:  is allergic to iodine.  Meds: Current Outpatient Prescriptions  Medication Sig Dispense Refill  . acyclovir (ZOVIRAX) 400 MG tablet TAKE 1 TABLET BY MOUTH TWICE A DAY 60 tablet 2  . Calcium Carbonate-Vitamin D (CALTRATE 600+D PO) Take 600 mg by mouth 2 (two) times daily.    . cephALEXin (KEFLEX) 500 MG capsule Take 1 capsule (500 mg total) by mouth 3 (three) times daily. 21 capsule 0  . cholecalciferol (VITAMIN D) 1000 UNITS tablet Take 1,000 Units by mouth at bedtime.     Marland Kitchen dexamethasone (DECADRON) 4 MG tablet 10 tab every week, start with chemo 40 tablet 4  . HYDROcodone-acetaminophen (NORCO) 5-325 MG per tablet Take 1 tablet by mouth every 6 (six) hours as needed for moderate pain. 60 tablet 0  . HYDROCORTISONE, TOPICAL, 2.5 % SOLN Apply 1 application topically daily as needed.     Marland Kitchen lenalidomide (REVLIMID) 25 MG capsule Take 1 capsule (25 mg total) by mouth daily. 11/01/14 Authorization number= [id number]  adult male 21 capsule 0  . levothyroxine  (SYNTHROID, LEVOTHROID) 25 MCG tablet Take 1 tablet (25 mcg total) by mouth daily. 30 tablet 4  . methocarbamol (ROBAXIN) 500 MG tablet TAKE 1 TABLET (500 MG TOTAL) BY MOUTH EVERY 6 (SIX) HOURS AS NEEDED FOR MUSCLE SPASMS. 60 tablet 0  . Multiple Vitamins-Minerals (CENTRUM SILVER PO) Take 1 tablet by mouth daily.    . NON FORMULARY Prunes prn    . Polyvinyl Alcohol-Povidone (REFRESH OP) Apply 1 drop to eye daily as needed (dry/irritated eyes).    . potassium chloride (K-DUR,KLOR-CON) 10 MEQ tablet Take 10 mEq by mouth daily.    . prochlorperazine (COMPAZINE) 10 MG tablet TAKE 1 TABLET BY MOUTH EVERY 6 HOURS AS NEEDED FOR NAUSEA AND VOMITING 30 tablet 0  . ranitidine (ZANTAC) 150 MG tablet Take 150 mg by mouth at bedtime.   11  . tamsulosin (FLOMAX) 0.4 MG CAPS capsule Take 1 capsule (0.4 mg total) by mouth at bedtime. 30 capsule 3  . warfarin (COUMADIN) 2 MG tablet Take 1 tablet (2 mg total) by mouth daily. 30 tablet 3  . furosemide (LASIX) 20 MG tablet Take 1 tablet (20 mg total) by mouth daily. As needed for swelling (Patient taking differently: Take 20 mg by mouth 2 (two) times daily. As needed for swelling) 10 tablet 0  . hyaluronate sodium (RADIAPLEXRX) GEL Apply 1 application topically daily. Apply to treated area on skin after rad txs daily and prn    . omeprazole (PRILOSEC) 40 MG capsule Take 40 mg by mouth daily.  In the afternoon  11   No current facility-administered medications for this encounter.    Physical Findings: The patient is in no acute distress. Patient is alert and oriented.  weight is 170 lb (77.111 kg). His temperature is 98.2 F (36.8 C). His blood pressure is 110/50 and his pulse is 74. His oxygen saturation is 100%. Kermit Balo range of motion in the right upper extremity   Lab Findings: Lab Results  Component Value Date   WBC 3.7* 11/22/2014   HGB 11.8* 11/22/2014   HCT 36.3* 11/22/2014   MCV 96.0 11/22/2014   PLT 130* 11/22/2014     Radiographic  Findings: No results found.  Impression:   Multiple Myeloma  with diffuse boney involvement. Good candidate for palliative radiation to the right shoulder.  Plan:  Proceed with a CT simulation. Pt has pain in the upper chest and should be looked over during the sim as well. Possibly schedule the sim on 11/24/14.  This document serves as a record of services personally performed by Jeremiah Rudd, MD. It was created on his behalf by Jeremiah Owens, a trained medical scribe. The creation of this record is based on the scribe's personal observations and the provider's statements to them. This document has been checked and approved by the attending provider.    Jeremiah Owens, M.D., Ph.D.

## 2014-11-22 NOTE — Progress Notes (Signed)
Please see the Nurse Progress Note in the MD Initial Consult Encounter for this patient. 

## 2014-11-22 NOTE — Progress Notes (Signed)
Histology and Location of Primary Cancer: Multiple myeloma diagnosed in November 2015   Sites of Visceral and Bony Metastatic Disease: multiple lytic lesions throughout the cervical and thoracic spine  Location(s) of Symptomatic Metastases: lytic bone lesions in right shoulder area, chest and back  Past/Anticipated chemotherapy by medical oncology, if any: weekly subcutaneous Velcade, Revlimid for 21 days every 4 weeks in addition to Decadron 40 mg weekly status post 1 cycle  Pain on a scale of 0-10 is: 2   Ambulatory status? Walker  SAFETY ISSUES:  Prior radiation? Yes 08/21/14-09/04/14 T4-6 25Gy/43f  ossible current pregnancy? no  Is the patient on methotrexate? no  Current Complaints / other details:

## 2014-11-22 NOTE — Addendum Note (Signed)
Encounter addended by: Norm Salt, RN on: 11/22/2014  3:49 PM<BR>     Documentation filed: Charges VN

## 2014-11-22 NOTE — Patient Instructions (Signed)
[facility] Discharge Instructions for Patients Receiving Chemotherapy  Today you received the following chemotherapy agents:  Velcade  To help prevent nausea and vomiting after your treatment, we encourage you to take your nausea medication as prescribed.   If you develop nausea and vomiting that is not controlled by your nausea medication, call the clinic.   BELOW ARE SYMPTOMS THAT SHOULD BE REPORTED IMMEDIATELY:  *FEVER GREATER THAN 100.5 F  *CHILLS WITH OR WITHOUT FEVER  NAUSEA AND VOMITING THAT IS NOT CONTROLLED WITH YOUR NAUSEA MEDICATION  *UNUSUAL SHORTNESS OF BREATH  *UNUSUAL BRUISING OR BLEEDING  TENDERNESS IN MOUTH AND THROAT WITH OR WITHOUT PRESENCE OF ULCERS  *URINARY PROBLEMS  *BOWEL PROBLEMS  UNUSUAL RASH Items with * indicate a potential emergency and should be followed up as soon as possible.  Feel free to call the clinic you have any questions or concerns. The clinic phone number is [phone].  Please show the CHEMO ALERT CARD at check-in to the Emergency Department and triage nurse.   

## 2014-11-22 NOTE — Addendum Note (Signed)
Encounter addended by: Norm Salt, RN on: 11/22/2014  6:16 PM<BR>     Documentation filed: Arn Medal VN

## 2014-11-23 ENCOUNTER — Ambulatory Visit: Payer: Medicare Other

## 2014-11-23 DIAGNOSIS — D492 Neoplasm of unspecified behavior of bone, soft tissue, and skin: Secondary | ICD-10-CM | POA: Diagnosis not present

## 2014-11-23 DIAGNOSIS — M2560 Stiffness of unspecified joint, not elsewhere classified: Secondary | ICD-10-CM | POA: Diagnosis not present

## 2014-11-23 DIAGNOSIS — R6889 Other general symptoms and signs: Secondary | ICD-10-CM | POA: Diagnosis not present

## 2014-11-23 DIAGNOSIS — Z7409 Other reduced mobility: Secondary | ICD-10-CM | POA: Diagnosis not present

## 2014-11-23 DIAGNOSIS — C9000 Multiple myeloma not having achieved remission: Secondary | ICD-10-CM | POA: Diagnosis not present

## 2014-11-23 DIAGNOSIS — C7951 Secondary malignant neoplasm of bone: Secondary | ICD-10-CM | POA: Diagnosis not present

## 2014-11-23 DIAGNOSIS — R262 Difficulty in walking, not elsewhere classified: Secondary | ICD-10-CM

## 2014-11-23 DIAGNOSIS — R279 Unspecified lack of coordination: Secondary | ICD-10-CM

## 2014-11-23 NOTE — Therapy (Signed)
[facility] [address] [place], [place], [address] Phone: [phone]   Fax:  [phone]  Physical Therapy Treatment  Patient Details  Name: Jeremiah Owens MRN: [id number] Date of Birth: [birth year] Referring Provider:  Wenda Low, MD  Encounter Date: 11/23/2014      PT End of Session - 11/23/14 1211    Visit Number 21   Number of Visits 33   Date for PT Re-Evaluation 12/29/14   Authorization Type Medicare [employer] Primary, [employer] secondary G codes required   PT Start Time 0802   PT Stop Time 0845   PT Time Calculation (min) 43 min      Past Medical History  Diagnosis Date  . Compression fracture   . Melanoma   . Bone cancer     t_spine T-5  . Prostate cancer 2002  . Skin cancer 1994    melanoma  right neck   . Multiple myeloma   . Allergy   . Anxiety   . Thyroid disease   . S/P radiation therapy 08/21/14-09/04/14    T4-6 25Gy/37f    Past Surgical History  Procedure Laterality Date  . Hernia repair    . Posterior cervical fusion/foraminotomy N/A 06/17/2014    Procedure: Thoracic five laminectomy for epidural tumor resection Thoracic 4-7 posterior lateral arthrodesis, segmental pedicle screw fixation.;  Surgeon: KAshok Pall MD;  [facility];  Service: Neurosurgery;  Laterality: N/A;    There were no vitals filed for this visit.  Visit Diagnosis:  Difficulty walking  Lack of coordination      Subjective Assessment - 11/23/14 0817    Subjective Pt will be starting radiation next week but would like to continue with PT   Currently in Pain? No/denies      Neuro Re-ed Turning with high knee side steps with MIN/MOD steadying assist with single point cane  Alterante foot taps on 6" box with MIN/MOD steadying assist 4 sets to form fatigue with verbal cues for weight shift, stance knee straight, maintaining level pelvis   Gait training: Gait training with single point cane with  close supervision x230', x230' again with CGA during final 550, x115' with supervisoin  -Also discussed the idea of utilizing a Rollator or RW long term when ambulating in the community as his diagnosis is progressive and it is important to decrease risk of falls in community as his bones will likely become fragile. Pt verbalized understanding however goal to utilize a cane in the home remains.                          PT Short Term Goals - 10/31/14 1648    PT SHORT TERM GOAL #1   Title Pt will demonstrate independence with HEP Target: 10/06/14   Status Achieved   PT SHORT TERM GOAL #2   Title Pt will increase BERG score to 36/56 for improving balance.  Target: 10/06/14   Status Achieved   PT SHORT TERM GOAL #3   Title Pt will Decrease TUG time to 14.69 seconds for improving efficiency with functional mobility using a single point cane. Target 10/06/14   Status Not Met  18.75 on 10/12/14   PT SHORT TERM GOAL #4   Title Pt will ambulate independently on indoor level surfaces with cane.  Target: 10/06/14   Status Not Met  requires CGA and demonstrates inefficient gait pattern with cane   PT SHORT TERM GOAL #5   Title  Pt will perform sit to stand without UE support, independently.   Target: 10/06/14   Status Not Met   Additional Short Term Goals   Additional Short Term Goals Yes   PT SHORT TERM GOAL #6   Title Pt will increase Berg Balance Test score to 46/56 for decreased fall risk. Target 12/01/14   Status New   PT SHORT TERM GOAL #7   Title Pt will decrease TUG time with LRAD to < 13.5 ft/sec for decreased fall risk. Target 12/01/14   PT SHORT TERM GOAL #8   Title Pt will increase gait speed to 2.32 ft/sec with LRAD. Target 12/01/14   PT SHORT TERM GOAL #9   TITLE Pt will ambulate on indoor level surfaces without assistive device with CGA x200' Target 12/01/14           PT Long Term Goals - 10/31/14 1455    PT LONG TERM GOAL #1   Title Pt will increase Berg score to  49/56 for improved balance.  Target 12/29/14   Baseline extended target date to end of renewal   Status On-going  Scored 43/56 on 10/31/14   PT LONG TERM GOAL #2   Title Pt will ambulate independently on indoor level surfaces without assistive device. Target 12/29/14   Baseline extended target date to end of renewal   Status On-going   PT LONG TERM GOAL #3   Title Pt will ambulate with suprervision on outdoor unlevel grass and concrete wih single point cane. Target 12/29/14   Baseline Revised to allow for single point cane and Extended target to end of renewal   Status On-going   PT LONG TERM GOAL #4   Title Pt will decrease TUG time to <13.5 seconds without assistive device for decreased fall risk and increased independence. TTarget 12/29/14   Baseline extended target to end of renewal   Status On-going  TUG with RW 14.83 seconds on 10/31/14   PT LONG TERM GOAL #5   Title Pt will increase gait speed to >2.63 ft/sec with LRAD for community ambulator status. Target 12/29/14   Baseline Revised to allow for assistive device and extended to end of renewal plan of care    Status --   PT LONG TERM GOAL #6   Title Increase FOTO ABC to 30% for improved balance conficdence. Target 12/29/14   Baseline extended to end of renewal plan of care   Status On-going               Plan - 11/23/14 1211    Clinical Impression Statement Pt required less assistance with gait training. Continues to demonstrate decreased foot clearance, even when ambulating with RW.    PT Next Visit Plan Begin checking STGs, hip strengthening; Cane training and balance        Problem List Patient Active Problem List   Diagnosis Date Noted  . Gait disorder 09/05/2014  . Multiple myeloma   . Paraplegia 06/21/2014  . Postoperative anemia due to acute blood loss 06/21/2014  . 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 Spine metastasis 06/17/2014  . Thoracic spine tumor 06/17/2014  . Metastatic bone cancer 06/15/2014     Delrae Sawyers D 11/23/2014, 12:14 PM  [facility] [address] [place], [place], [address] Phone: [phone]   Fax:  [phone]

## 2014-11-24 ENCOUNTER — Ambulatory Visit
Admission: RE | Admit: 2014-11-24 | Discharge: 2014-11-24 | Disposition: A | Discharge status: Home / Self Care | Payer: Medicare Other | Source: Ambulatory Visit | Attending: Radiation Oncology | Admitting: Radiation Oncology

## 2014-11-24 DIAGNOSIS — C9000 Multiple myeloma not having achieved remission: Secondary | ICD-10-CM | POA: Diagnosis not present

## 2014-11-24 DIAGNOSIS — C7951 Secondary malignant neoplasm of bone: Secondary | ICD-10-CM | POA: Diagnosis not present

## 2014-11-24 DIAGNOSIS — C419 Malignant neoplasm of bone and articular cartilage, unspecified: Secondary | ICD-10-CM

## 2014-11-24 DIAGNOSIS — Z510 Encounter for antineoplastic radiation therapy: Secondary | ICD-10-CM | POA: Diagnosis not present

## 2014-11-27 ENCOUNTER — Ambulatory Visit: Payer: Medicare Other

## 2014-11-27 DIAGNOSIS — D492 Neoplasm of unspecified behavior of bone, soft tissue, and skin: Secondary | ICD-10-CM | POA: Diagnosis not present

## 2014-11-27 DIAGNOSIS — M2560 Stiffness of unspecified joint, not elsewhere classified: Secondary | ICD-10-CM | POA: Diagnosis not present

## 2014-11-27 DIAGNOSIS — R262 Difficulty in walking, not elsewhere classified: Secondary | ICD-10-CM

## 2014-11-27 DIAGNOSIS — R6889 Other general symptoms and signs: Secondary | ICD-10-CM | POA: Diagnosis not present

## 2014-11-27 DIAGNOSIS — Z7409 Other reduced mobility: Secondary | ICD-10-CM | POA: Diagnosis not present

## 2014-11-27 DIAGNOSIS — C9000 Multiple myeloma not having achieved remission: Secondary | ICD-10-CM | POA: Diagnosis not present

## 2014-11-27 DIAGNOSIS — R279 Unspecified lack of coordination: Secondary | ICD-10-CM

## 2014-11-27 DIAGNOSIS — R293 Abnormal posture: Secondary | ICD-10-CM

## 2014-11-27 DIAGNOSIS — C7951 Secondary malignant neoplasm of bone: Secondary | ICD-10-CM | POA: Diagnosis not present

## 2014-11-27 DIAGNOSIS — R609 Edema, unspecified: Secondary | ICD-10-CM | POA: Diagnosis not present

## 2014-11-27 NOTE — Therapy (Signed)
[facility] [address] [place], [place], [address] Phone: [phone]   Fax:  [phone]  Physical Therapy Treatment  Patient Details  Name: Jeremiah Owens MRN: [id number] Date of Birth: [birth year] Referring Provider:  Wenda Low, MD  Encounter Date: 11/27/2014      PT End of Session - 11/27/14 1213    Visit Number 22   Number of Visits 33   Date for PT Re-Evaluation 12/29/14   Authorization Type Medicare [employer], [employer] secondary G codes required   PT Start Time 0930   PT Stop Time 1015   PT Time Calculation (min) 45 min   Equipment Utilized During Treatment Gait belt      Past Medical History  Diagnosis Date  . Compression fracture   . Melanoma   . Bone cancer     t_spine T-5  . Prostate cancer 2002  . Skin cancer 1994    melanoma  right neck   . Multiple myeloma   . Allergy   . Anxiety   . Thyroid disease   . S/P radiation therapy 08/21/14-09/04/14    T4-6 25Gy/40f    Past Surgical History  Procedure Laterality Date  . Hernia repair    . Posterior cervical fusion/foraminotomy N/A 06/17/2014    Procedure: Thoracic five laminectomy for epidural tumor resection Thoracic 4-7 posterior lateral arthrodesis, segmental pedicle screw fixation.;  Surgeon: KAshok Pall MD;  [facility];  Service: Neurosurgery;  Laterality: N/A;    There were no vitals filed for this visit.  Visit Diagnosis:  Difficulty walking  Lack of coordination  Posture abnormality      Subjective Assessment - 11/27/14 0937    Subjective I thought if I was walking a lot (ie to go to church and run errands) that it would make me better.   Currently in Pain? Yes   Pain Score 1    Pain Location Chest  and mid back   Pain Orientation Mid   Pain Descriptors / Indicators Aching   Pain Onset More than a month ago   Pain Frequency Intermittent   Aggravating Factors  worse in am   Pain Relieving Factors being  busy and forgetting about it            [facility] - [facility] 0001    Ambulation/Gait   Gait velocity 1.91 ft/sec with RW; "fast" with RW was 2.63 ft/sec, 1.85 ft/sec with SPC   Berg Balance Test   Sit to Stand Able to stand without using hands and stabilize independently   Standing Unsupported Able to stand safely 2 minutes   Sitting with Back Unsupported but Feet Supported on Floor or Stool Able to sit safely and securely 2 minutes   Stand to Sit Sits safely with minimal use of hands   Transfers Able to transfer safely, minor use of hands   Standing Unsupported with Eyes Closed Able to stand 10 seconds with supervision   Standing Ubsupported with Feet Together Able to place feet together independently and stand for 1 minute with supervision   From Standing, Reach Forward with Outstretched Arm Can reach confidently >25 cm (10")   From Standing Position, Pick up Object from Floor Able to pick up shoe, needs supervision   From Standing Position, Turn to Look Behind Over each Shoulder Looks behind one side only/other side shows less weight shift   Turn 360 Degrees Able to turn 360 degrees safely but slowly   Standing Unsupported, Alternately  Place Feet on Step/Stool Able to complete 4 steps without aid or supervision   Standing Unsupported, One Foot in Front Able to plae foot ahead of the other independently and hold 30 seconds   Standing on One Leg Able to lift leg independently and hold equal to or more than 3 seconds   Total Score 45   Timed Up and Go Test   TUG Normal TUG   Normal TUG (seconds) 16.09  with RW; 17.33 with SPC;       Checked goals; then gait training with [facility] x325' with close supervision, continues to have knee flexion in stance, swayback posture, foot scuffing (decreased foot clearance). This was the longest distance pt has been able to ambulate with cane.  Discussed pt's HEP, pt was corrected in performance of hamstring stretch and pelvic  tilts                         PT Short Term Goals - 11/27/14 0939    PT SHORT TERM GOAL #1   Title Pt will demonstrate independence with HEP Target: 10/06/14   Status Achieved   PT SHORT TERM GOAL #2   Title Pt will increase BERG score to 36/56 for improving balance.  Target: 10/06/14   Status Achieved   PT SHORT TERM GOAL #3   Title Pt will Decrease TUG time to 14.69 seconds for improving efficiency with functional mobility using a single point cane. Target 10/06/14   Status Not Met  18.75 on 10/12/14   PT SHORT TERM GOAL #4   Title Pt will ambulate independently on indoor level surfaces with cane.  Target: 10/06/14   Status Not Met  requires CGA and demonstrates inefficient gait pattern with cane   PT SHORT TERM GOAL #5   Title Pt will perform sit to stand without UE support, independently.   Target: 10/06/14   Status Not Met   PT SHORT TERM GOAL #6   Title Pt will increase Berg Balance Test score to 46/56 for decreased fall risk. Target 12/01/14   Status Not Met  Progressing; scored 45/56 on 11/27/14   PT SHORT TERM GOAL #7   Title Pt will decrease TUG time with LRAD to < 13.5 ft/sec for decreased fall risk. Target 12/01/14   Status Not Met   PT SHORT TERM GOAL #8   Title Pt will increase gait speed to 2.32 ft/sec with LRAD. Target 12/01/14   Status Partially Met  Gait speed increased to 2.63 with RW when walking "fast" but is 1.91 at "normal pace"   PT SHORT TERM GOAL #9   TITLE Pt will ambulate on indoor level surfaces with single point cane with CGA x200' Target 12/01/14   Baseline Revised: to use single point cane rather than no assistive device due to nature of disease causing fragile bones and pt is unlikely to progress to ambulate without device.    Status Achieved           PT Long Term Goals - 11/27/14 1220    PT LONG TERM GOAL #1   Title Pt will increase Berg score to 49/56 for improved balance.  Target 12/29/14   Baseline extended target date to end of  renewal   Status On-going  Scored 43/56 on 10/31/14   PT LONG TERM GOAL #2   Title Pt will ambulate modified independently on indoor level surfaces with cane. Target 12/29/14   Baseline revised to utilize cane rather than no  assistive device   Status Revised   PT LONG TERM GOAL #3   Title Pt will ambulate with suprervision on outdoor unlevel grass and concrete wih single point cane. Target 12/29/14   Baseline Revised to allow for single point cane and Extended target to end of renewal   Status On-going   PT LONG TERM GOAL #4   Title Pt will decrease TUG time to <13.5 seconds with  cane for decreased fall risk and increased independence. TTarget 5/27/1h   Baseline revised to use cane   Status Revised  TUG with RW 14.83 seconds on 10/31/14   PT LONG TERM GOAL #5   Title Pt will increase gait speed to >2.63 ft/sec with LRAD for community ambulator status. Target 12/29/14   Baseline Revised to allow for assistive device and extended to end of renewal plan of care    PT LONG TERM GOAL #6   Title Increase FOTO ABC to 30% for improved balance conficdence. Target 12/29/14   Baseline extended to end of renewal plan of care   Status On-going               Plan - 11/27/14 1218    Clinical Impression Statement Pt made progress toward short term goals, but goals not met. Pt has significantly improved since start of therapy with balance and ability to utilize cane. Pt's goal is to utilize the cane functionally. Will continue to benefit from skilled PT services to progress to the level of using a cane in the home.    PT Next Visit Plan Hip strengthening, especially gluts        Problem List Patient Active Problem List   Diagnosis Date Noted  . Gait disorder 09/05/2014  . Multiple myeloma   . Paraplegia 06/21/2014  . Postoperative anemia due to acute blood loss 06/21/2014  . 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 Spine metastasis 06/17/2014  . Thoracic spine tumor 06/17/2014  .  Metastatic bone cancer 06/15/2014    Delrae Sawyers, PT,DPT,NCS 11/27/2014 12:23 PM Phone [phone] FAX [phone]         [facility] [address] [place], [place], [address] Phone: [phone]   Fax:  [phone]

## 2014-11-29 ENCOUNTER — Other Ambulatory Visit: Payer: Self-pay | Admitting: Medical Oncology

## 2014-11-29 ENCOUNTER — Telehealth: Payer: Self-pay

## 2014-11-29 ENCOUNTER — Other Ambulatory Visit: Payer: Self-pay | Admitting: *Deleted

## 2014-11-29 ENCOUNTER — Other Ambulatory Visit (HOSPITAL_BASED_OUTPATIENT_CLINIC_OR_DEPARTMENT_OTHER): Payer: Medicare Other

## 2014-11-29 ENCOUNTER — Ambulatory Visit: Payer: Medicare Other

## 2014-11-29 ENCOUNTER — Ambulatory Visit (HOSPITAL_BASED_OUTPATIENT_CLINIC_OR_DEPARTMENT_OTHER): Payer: Medicare Other

## 2014-11-29 ENCOUNTER — Ambulatory Visit
Admission: RE | Admit: 2014-11-29 | Discharge: 2014-11-29 | Disposition: A | Discharge status: Home / Self Care | Payer: Medicare Other | Source: Ambulatory Visit | Attending: Radiation Oncology | Admitting: Radiation Oncology

## 2014-11-29 ENCOUNTER — Ambulatory Visit (HOSPITAL_BASED_OUTPATIENT_CLINIC_OR_DEPARTMENT_OTHER): Payer: Medicare Other | Admitting: Nurse Practitioner

## 2014-11-29 ENCOUNTER — Other Ambulatory Visit: Payer: Medicare Other

## 2014-11-29 VITALS — BP 135/56 | HR 72 | Temp 98.9°F | Resp 18 | Ht 69.0 in | Wt 169.4 lb

## 2014-11-29 DIAGNOSIS — C9000 Multiple myeloma not having achieved remission: Secondary | ICD-10-CM | POA: Diagnosis not present

## 2014-11-29 DIAGNOSIS — C7951 Secondary malignant neoplasm of bone: Secondary | ICD-10-CM | POA: Diagnosis not present

## 2014-11-29 DIAGNOSIS — Z5112 Encounter for antineoplastic immunotherapy: Secondary | ICD-10-CM | POA: Diagnosis present

## 2014-11-29 DIAGNOSIS — Z510 Encounter for antineoplastic radiation therapy: Secondary | ICD-10-CM | POA: Diagnosis not present

## 2014-11-29 DIAGNOSIS — M25511 Pain in right shoulder: Secondary | ICD-10-CM | POA: Diagnosis not present

## 2014-11-29 LAB — CBC WITH DIFFERENTIAL/PLATELET
BASO%: 0.1 % (ref 0.0–2.0)
BASOS ABS: 0 10*3/uL (ref 0.0–0.1)
EOS ABS: 0 10*3/uL (ref 0.0–0.5)
EOS%: 0.9 % (ref 0.0–7.0)
HEMATOCRIT: 36.6 % — AB (ref 38.4–49.9)
HGB: 11.7 g/dL — ABNORMAL LOW (ref 13.0–17.1)
LYMPH#: 0.3 10*3/uL — AB (ref 0.9–3.3)
LYMPH%: 9.6 % — ABNORMAL LOW (ref 14.0–49.0)
MCH: 30.9 pg (ref 27.2–33.4)
MCHC: 32 g/dL (ref 32.0–36.0)
MCV: 96.5 fL (ref 79.3–98.0)
MONO#: 0 10*3/uL — ABNORMAL LOW (ref 0.1–0.9)
MONO%: 1.2 % (ref 0.0–14.0)
NEUT#: 2.9 10*3/uL (ref 1.5–6.5)
NEUT%: 88.2 % — AB (ref 39.0–75.0)
Platelets: 116 10*3/uL — ABNORMAL LOW (ref 140–400)
RBC: 3.79 10*6/uL — ABNORMAL LOW (ref 4.20–5.82)
RDW: 15.5 % — AB (ref 11.0–14.6)
WBC: 3.3 10*3/uL — ABNORMAL LOW (ref 4.0–10.3)

## 2014-11-29 LAB — BASIC METABOLIC PANEL (CC13)
Anion Gap: 11 mEq/L (ref 3–11)
BUN: 20.9 mg/dL (ref 7.0–26.0)
CHLORIDE: 102 meq/L (ref 98–109)
CO2: 25 mEq/L (ref 22–29)
Calcium: 8.7 mg/dL (ref 8.4–10.4)
Creatinine: 0.9 mg/dL (ref 0.7–1.3)
EGFR: 78 mL/min/{1.73_m2} — AB (ref >90)
Glucose: 193 mg/dl — ABNORMAL HIGH (ref 70–140)
POTASSIUM: 4.2 meq/L (ref 3.5–5.1)
Sodium: 137 mEq/L (ref 136–145)

## 2014-11-29 MED ORDER — ONDANSETRON HCL 8 MG PO TABS
ORAL_TABLET | ORAL | Status: AC
  Filled 2014-11-29: qty 1

## 2014-11-29 MED ORDER — LENALIDOMIDE 25 MG PO CAPS
25.0000 mg | ORAL_CAPSULE | Freq: Every day | ORAL | Status: DC
Start: 2014-11-29 — End: 2014-11-29

## 2014-11-29 MED ORDER — SODIUM CHLORIDE 0.9 % IV SOLN
Freq: Once | INTRAVENOUS | Status: AC
  Administered 2014-11-29: 15:00:00 via INTRAVENOUS

## 2014-11-29 MED ORDER — LENALIDOMIDE 25 MG PO CAPS: 25.0000 mg | ORAL_CAPSULE | Freq: Every day | ORAL | Status: DC

## 2014-11-29 MED ORDER — ZOLEDRONIC ACID 4 MG/100ML IV SOLN
4.0000 mg | Freq: Once | INTRAVENOUS | Status: AC
  Administered 2014-11-29: 4 mg via INTRAVENOUS
  Filled 2014-11-29: qty 100

## 2014-11-29 MED ORDER — BORTEZOMIB CHEMO SQ INJECTION 3.5 MG (2.5MG/ML)
1.3000 mg/m2 | Freq: Once | INTRAMUSCULAR | Status: AC
  Administered 2014-11-29: 2.5 mg via SUBCUTANEOUS
  Filled 2014-11-29: qty 2.5

## 2014-11-29 MED ORDER — ONDANSETRON HCL 8 MG PO TABS
8.0000 mg | ORAL_TABLET | Freq: Once | ORAL | Status: AC
Start: 2014-11-29 — End: 2014-11-29
  Administered 2014-11-29: 8 mg via ORAL

## 2014-11-29 NOTE — Telephone Encounter (Signed)
revlimid refill request to MD

## 2014-11-29 NOTE — Progress Notes (Signed)
  [facility] OFFICE PROGRESS NOTE   DIAGNOSIS: Multiple myeloma diagnosed in November 2015  PRIOR THERAPY:  1) Status post thoracic five laminectomy for epidural tumor resection, Thoracic 4-7 posterior lateral arthrodesis,  segmental pedicle screw fixation T4-T7 Globus instrumentation under the care of Dr. Christella Noa on 06/18/2014. 2) Systemic chemotherapy with Velcade 1.3 MG/M2 subcutaneously weekly in addition to Decadron 40 mg by mouth on weekly basis. Status post 11 cycles.  CURRENT THERAPY:  1) Systemic chemotherapy with Velcade 1.3 MG/M2 subcutaneously weekly, Decadron 40 mg by mouth weekly in addition to Revlimid 25 MG by mouth daily for 21 days every 4 weeks, status post 1 cycle. 2) Zometa 4 mg IV every month. First dose 11/01/2014.  INTERVAL HISTORY:   Jeremiah Owens returns as scheduled. He continues weekly Velcade and dexamethasone. He reports he will begin the 7 day break of cycle 2 Revlimid on 12/02/2014. He denies nausea/vomiting. No mouth sores. No diarrhea. He has constipation periodically. The constipation is usually relieved with dietary changes. He notes a poor energy level. There has been improvement in the leg swelling with Lasix. The "infection" at the lower legs has healed since completing the course of antibiotics. He denies fever. No shortness of breath or cough. He continues to have right shoulder pain and is scheduled to begin radiation today. He continues to work with physical therapy and has noticed some improvement.  Objective:  Vital signs in last 24 hours:  Blood pressure 135/56, pulse 72, temperature 98.9 F (37.2 C), temperature source Oral, resp. rate 18, height $RemoveBe'5\' 9"'xQLAFlAFL$  (1.753 m), weight 169 lb 6.4 oz (76.839 kg), SpO2 98 %.    HEENT: No thrush or ulcers. Lymphatics: No palpable cervical or supra-clavicular lymph nodes. Resp: Lungs clear bilaterally. Cardio: Regular rate and rhythm. GI: Abdomen soft and nontender. No hepatomegaly. Vascular:  Pitting edema at the lower legs bilaterally. Lower legs with very mild erythema and skin changes consistent with stasis dermatitis. Neuro: Alert and oriented. Motor strength 5 over 5.     Lab Results:  Lab Results  Component Value Date   WBC 3.3* 11/29/2014   HGB 11.7* 11/29/2014   HCT 36.6* 11/29/2014   MCV 96.5 11/29/2014   PLT 116* 11/29/2014   NEUTROABS 2.9 11/29/2014    Imaging:  No results found.  Medications: I have reviewed the patient's current medications.  Assessment/Plan: 1. Multiple myeloma status post treatment with subcutaneous Velcade and Decadron 11 weekly doses 07/19/2014 through 09/27/2014 with subsequent discontinuation due to disease progression. Treatment continued with weekly subcutaneous Velcade/weekly dexamethasone and initiation of Revlimid 21 days on/7 days off every 4 weeks currently completing cycle 2. 2. Right shoulder pain secondary to #1. He begins radiation today. 3. Lower extremity cellulitis 11/15/2014 status post 7 day course of Keflex.   Disposition: [name] appears stable. Plan to continue weekly Velcade/dexamethasone. He will begin the next cycle of Revlimid on 12/09/2014. He is scheduled to receive Zometa today. He begins palliative radiation to the right shoulder today. We scheduled a return visit in 3 weeks. He will contact the office in the interim with any problems.    Ned Card ANP/GNP-BC   11/29/2014  2:39 PM

## 2014-11-29 NOTE — Patient Instructions (Signed)
[facility] Discharge Instructions for Patients Receiving Chemotherapy  Today you received the following chemotherapy agents velcade and zometa  To help prevent nausea and vomiting after your treatment, we encourage you to take your nausea medication.    If you develop nausea and vomiting that is not controlled by your nausea medication, call the clinic.   BELOW ARE SYMPTOMS THAT SHOULD BE REPORTED IMMEDIATELY:  *FEVER GREATER THAN 100.5 F  *CHILLS WITH OR WITHOUT FEVER  NAUSEA AND VOMITING THAT IS NOT CONTROLLED WITH YOUR NAUSEA MEDICATION  *UNUSUAL SHORTNESS OF BREATH  *UNUSUAL BRUISING OR BLEEDING  TENDERNESS IN MOUTH AND THROAT WITH OR WITHOUT PRESENCE OF ULCERS  *URINARY PROBLEMS  *BOWEL PROBLEMS  UNUSUAL RASH Items with * indicate a potential emergency and should be followed up as soon as possible.  Feel free to call the clinic you have any questions or concerns. The clinic phone number is [phone].  Please show the [facility] at check-in to the Emergency Department and triage nurse.

## 2014-11-29 NOTE — Addendum Note (Signed)
Addended by: Ardeen Garland on: 11/29/2014 03:30 PM   Modules accepted: Orders

## 2014-11-30 ENCOUNTER — Ambulatory Visit: Payer: Medicare Other

## 2014-11-30 ENCOUNTER — Ambulatory Visit
Admission: RE | Admit: 2014-11-30 | Discharge: 2014-11-30 | Disposition: A | Discharge status: Home / Self Care | Payer: Medicare Other | Source: Ambulatory Visit | Attending: Radiation Oncology | Admitting: Radiation Oncology

## 2014-11-30 ENCOUNTER — Other Ambulatory Visit: Payer: Self-pay | Admitting: Medical Oncology

## 2014-11-30 DIAGNOSIS — D492 Neoplasm of unspecified behavior of bone, soft tissue, and skin: Secondary | ICD-10-CM | POA: Diagnosis not present

## 2014-11-30 DIAGNOSIS — R6889 Other general symptoms and signs: Secondary | ICD-10-CM | POA: Diagnosis not present

## 2014-11-30 DIAGNOSIS — M2560 Stiffness of unspecified joint, not elsewhere classified: Secondary | ICD-10-CM | POA: Diagnosis not present

## 2014-11-30 DIAGNOSIS — Z7409 Other reduced mobility: Secondary | ICD-10-CM | POA: Diagnosis not present

## 2014-11-30 DIAGNOSIS — Z510 Encounter for antineoplastic radiation therapy: Secondary | ICD-10-CM | POA: Diagnosis not present

## 2014-11-30 DIAGNOSIS — R262 Difficulty in walking, not elsewhere classified: Secondary | ICD-10-CM

## 2014-11-30 DIAGNOSIS — R279 Unspecified lack of coordination: Secondary | ICD-10-CM

## 2014-11-30 DIAGNOSIS — C9000 Multiple myeloma not having achieved remission: Secondary | ICD-10-CM | POA: Diagnosis not present

## 2014-11-30 DIAGNOSIS — R29898 Other symptoms and signs involving the musculoskeletal system: Secondary | ICD-10-CM

## 2014-11-30 DIAGNOSIS — C7951 Secondary malignant neoplasm of bone: Secondary | ICD-10-CM | POA: Diagnosis not present

## 2014-11-30 MED ORDER — RADIAPLEXRX EX GEL
Freq: Once | CUTANEOUS | Status: AC
  Administered 2014-11-30: 11:00:00 via TOPICAL

## 2014-11-30 NOTE — Therapy (Signed)
[facility] [address] [place], [place], [address] Phone: [phone]   Fax:  [phone]  Physical Therapy Treatment  Patient Details  Name: Jeremiah Owens MRN: [id number] Date of Birth: [birth year] Referring Provider:  Wenda Low, MD  Encounter Date: 11/30/2014      PT End of Session - 11/30/14 1153    Visit Number 23   Number of Visits 33   Date for PT Re-Evaluation 12/29/14   Authorization Type Medicare [employer] Primary, [employer] secondary G codes required   PT Start Time 0847   PT Stop Time 0934   PT Time Calculation (min) 47 min      Past Medical History  Diagnosis Date  . Compression fracture   . Melanoma   . Bone cancer     t_spine T-5  . Prostate cancer 2002  . Skin cancer 1994    melanoma  right neck   . Multiple myeloma   . Allergy   . Anxiety   . Thyroid disease   . S/P radiation therapy 08/21/14-09/04/14    T4-6 25Gy/72f    Past Surgical History  Procedure Laterality Date  . Hernia repair    . Posterior cervical fusion/foraminotomy N/A 06/17/2014    Procedure: Thoracic five laminectomy for epidural tumor resection Thoracic 4-7 posterior lateral arthrodesis, segmental pedicle screw fixation.;  Surgeon: KAshok Pall MD;  [facility];  Service: Neurosurgery;  Laterality: N/A;    There were no vitals filed for this visit.  Visit Diagnosis:  Difficulty walking  Lack of coordination  Weakness of left leg      Subjective Assessment - 11/30/14 0852    Subjective Pt saw a doctor yesterday and it was confirmed that the left lower extremity is no longer infected. He feels that his bowels are doing better with no complaints today.   Currently in Pain? No/denies        Gait trainnig with SPC 2x230' with MIN A to steady and to facilitate glut med in stance (R and L). Verbal cues for upright posture and head, for decreased scissoring and for increased knee extension in  stance.  Forward/backward rocking with staggered stance with single UE support on chair, emphasis on knee extension in stance prior to 3rd trial of gait training. Tactile cue at stance glut and quad to facilitate.  Gait training [phone] with RW with emphasis on knee extension in stance-with verbal cues, tactile cues, mirror for visual cue. Then x115' with cane with CGA and improved knee extension in stance noted  Pelvic tilts 2x10 with facilitation, again after second bout of gait training 2x10-20 clamshells each leg with assistance to maintain forward roll                           PT Short Term Goals - 11/27/14 0939    PT SHORT TERM GOAL #1   Title Pt will demonstrate independence with HEP Target: 10/06/14   Status Achieved   PT SHORT TERM GOAL #2   Title Pt will increase BERG score to 36/56 for improving balance.  Target: 10/06/14   Status Achieved   PT SHORT TERM GOAL #3   Title Pt will Decrease TUG time to 14.69 seconds for improving efficiency with functional mobility using a single point cane. Target 10/06/14   Status Not Met  18.75 on 10/12/14   PT SHORT TERM GOAL #4   Title Pt will ambulate independently on indoor level surfaces  with cane.  Target: 10/06/14   Status Not Met  requires CGA and demonstrates inefficient gait pattern with cane   PT SHORT TERM GOAL #5   Title Pt will perform sit to stand without UE support, independently.   Target: 10/06/14   Status Not Met   PT SHORT TERM GOAL #6   Title Pt will increase Berg Balance Test score to 46/56 for decreased fall risk. Target 12/01/14   Status Not Met  Progressing; scored 45/56 on 11/27/14   PT SHORT TERM GOAL #7   Title Pt will decrease TUG time with LRAD to < 13.5 ft/sec for decreased fall risk. Target 12/01/14   Status Not Met   PT SHORT TERM GOAL #8   Title Pt will increase gait speed to 2.32 ft/sec with LRAD. Target 12/01/14   Status Partially Met  Gait speed increased to 2.63 with RW when walking "fast"  but is 1.91 at "normal pace"   PT SHORT TERM GOAL #9   TITLE Pt will ambulate on indoor level surfaces with single point cane with CGA x200' Target 12/01/14   Baseline Revised: to use single point cane rather than no assistive device due to nature of disease causing fragile bones and pt is unlikely to progress to ambulate without device.    Status Achieved           PT Long Term Goals - 11/27/14 1220    PT LONG TERM GOAL #1   Title Pt will increase Berg score to 49/56 for improved balance.  Target 12/29/14   Baseline extended target date to end of renewal   Status On-going  Scored 43/56 on 10/31/14   PT LONG TERM GOAL #2   Title Pt will ambulate modified independently on indoor level surfaces with cane. Target 12/29/14   Baseline revised to utilize cane rather than no assistive device   Status Revised   PT LONG TERM GOAL #3   Title Pt will ambulate with suprervision on outdoor unlevel grass and concrete wih single point cane. Target 12/29/14   Baseline Revised to allow for single point cane and Extended target to end of renewal   Status On-going   PT LONG TERM GOAL #4   Title Pt will decrease TUG time to <13.5 seconds with  cane for decreased fall risk and increased independence. TTarget 5/27/1h   Baseline revised to use cane   Status Revised  TUG with RW 14.83 seconds on 10/31/14   PT LONG TERM GOAL #5   Title Pt will increase gait speed to >2.63 ft/sec with LRAD for community ambulator status. Target 12/29/14   Baseline Revised to allow for assistive device and extended to end of renewal plan of care    PT LONG TERM GOAL #6   Title Increase FOTO ABC to 30% for improved balance conficdence. Target 12/29/14   Baseline extended to end of renewal plan of care   Status On-going               Plan - 11/30/14 1155    Clinical Impression Statement Pt required assistance with all bouts of gait training with cane today, but demonstrated improved knee extension in stance with training  today. He continues to make progress. Continue per plan of care.   PT Next Visit Plan Hip strengthening, especially gluts        Problem List Patient Active Problem List   Diagnosis Date Noted  . Gait disorder 09/05/2014  . Multiple myeloma   . Paraplegia  06/21/2014  . Postoperative anemia due to acute blood loss 06/21/2014  . 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 Spine metastasis 06/17/2014  . Thoracic spine tumor 06/17/2014  . Metastatic bone cancer 06/15/2014    Delrae Sawyers, PT,DPT,NCS 11/30/2014 11:59 AM Phone [phone] FAX [phone]          [facility] [address] [place], [place], [address] Phone: [phone]   Fax:  [phone]

## 2014-12-01 ENCOUNTER — Ambulatory Visit
Admission: RE | Admit: 2014-12-01 | Discharge: 2014-12-01 | Disposition: A | Discharge status: Home / Self Care | Payer: Medicare Other | Source: Ambulatory Visit | Attending: Radiation Oncology | Admitting: Radiation Oncology

## 2014-12-01 ENCOUNTER — Encounter: Payer: Self-pay | Admitting: Radiation Oncology

## 2014-12-01 VITALS — BP 122/58 | HR 78 | Temp 98.6°F | Resp 16 | Ht 68.0 in | Wt 172.9 lb

## 2014-12-01 DIAGNOSIS — C9000 Multiple myeloma not having achieved remission: Secondary | ICD-10-CM

## 2014-12-01 DIAGNOSIS — C7951 Secondary malignant neoplasm of bone: Secondary | ICD-10-CM | POA: Diagnosis not present

## 2014-12-01 DIAGNOSIS — Z510 Encounter for antineoplastic radiation therapy: Secondary | ICD-10-CM | POA: Diagnosis not present

## 2014-12-01 NOTE — Progress Notes (Signed)
Jeremiah Owens has completed 3 fractions to his scapula.  He is using a walker today.  He reports pain in his mid back at a 1/10 that he says is not new.  He takes norco as needed.  He denies numbness in his shoulder or arm.  He is getting chemotherapy on Wednesday's with decadron and is also taking revlimid.    BP 122/58 mmHg  Pulse 78  Temp(Src) 98.6 F (37 C) (Oral)  Resp 16  Ht 5\' 8"  (1.727 m)  Wt 172 lb 14.4 oz (78.427 kg)  BMI 26.30 kg/m2  SpO2 100%

## 2014-12-01 NOTE — Progress Notes (Signed)
  Radiation Oncology         [phone] ________________________________  Name: Jeremiah Owens  MRN: [id number]  Date: 12/01/2014  DOB: [birth year]     Weekly Radiation Therapy Management    ICD-9-CM ICD-10-CM   1. Spine metastasis 198.5 C79.51   2. Multiple myeloma 203.00 C90.00     Current Dose: 7.5 Gy     Planned Dose:  25 Gy  Narrative . . . . . . . . The patient presents for routine under treatment assessment.                                  Jeremiah Owens has completed 3 fractions to his scapula. He is using a walker today. He reports pain in his mid back at a 1/10 that he says is not new. He takes norco as needed. He denies numbness in his shoulder or arm. He is getting chemotherapy on Wednesday's with decadron and is also taking revlimid                                 Set-up films were reviewed.                                 The chart was checked. Physical Findings. . .  height is 5' 8" (1.727 m) and weight is 172 lb 14.4 oz (78.427 kg). His oral temperature is 98.6 F (37 C). His blood pressure is 122/58 and his pulse is 78. His respiration is 16 and oxygen saturation is 100%. . Weight essentially stable.  No significant changes. Impression . . . . . . . The patient is tolerating radiation. Plan . . . . . . . . . . . . Continue treatment as planned.  ________________________________  Sheral Apley. Tammi Klippel, M.D.

## 2014-12-04 ENCOUNTER — Ambulatory Visit
Admission: RE | Admit: 2014-12-04 | Discharge: 2014-12-04 | Disposition: A | Discharge status: Home / Self Care | Payer: Medicare Other | Source: Ambulatory Visit | Attending: Radiation Oncology | Admitting: Radiation Oncology

## 2014-12-04 ENCOUNTER — Ambulatory Visit: Payer: Medicare Other | Attending: Physical Medicine & Rehabilitation

## 2014-12-04 ENCOUNTER — Telehealth: Payer: Self-pay | Admitting: Medical Oncology

## 2014-12-04 ENCOUNTER — Other Ambulatory Visit: Payer: Self-pay | Admitting: Medical Oncology

## 2014-12-04 DIAGNOSIS — M25511 Pain in right shoulder: Secondary | ICD-10-CM | POA: Insufficient documentation

## 2014-12-04 DIAGNOSIS — C9000 Multiple myeloma not having achieved remission: Secondary | ICD-10-CM

## 2014-12-04 DIAGNOSIS — Z7409 Other reduced mobility: Secondary | ICD-10-CM | POA: Insufficient documentation

## 2014-12-04 DIAGNOSIS — D492 Neoplasm of unspecified behavior of bone, soft tissue, and skin: Secondary | ICD-10-CM | POA: Insufficient documentation

## 2014-12-04 DIAGNOSIS — R262 Difficulty in walking, not elsewhere classified: Secondary | ICD-10-CM | POA: Insufficient documentation

## 2014-12-04 DIAGNOSIS — C7951 Secondary malignant neoplasm of bone: Secondary | ICD-10-CM | POA: Diagnosis not present

## 2014-12-04 DIAGNOSIS — R6889 Other general symptoms and signs: Secondary | ICD-10-CM | POA: Diagnosis not present

## 2014-12-04 DIAGNOSIS — M2560 Stiffness of unspecified joint, not elsewhere classified: Secondary | ICD-10-CM | POA: Diagnosis not present

## 2014-12-04 DIAGNOSIS — R29898 Other symptoms and signs involving the musculoskeletal system: Secondary | ICD-10-CM

## 2014-12-04 DIAGNOSIS — R293 Abnormal posture: Secondary | ICD-10-CM

## 2014-12-04 DIAGNOSIS — Z510 Encounter for antineoplastic radiation therapy: Secondary | ICD-10-CM | POA: Diagnosis not present

## 2014-12-04 MED ORDER — LENALIDOMIDE 25 MG PO CAPS
25.0000 mg | ORAL_CAPSULE | Freq: Every day | ORAL | Status: DC
Start: 2014-12-04 — End: 2014-12-29

## 2014-12-04 NOTE — Telephone Encounter (Signed)
Faxed rx to [facility]

## 2014-12-04 NOTE — Therapy (Signed)
[facility] [address] [place], [place], [address] Phone: [phone]   Fax:  [phone]  Physical Therapy Treatment  Patient Details  Name: Jeremiah Owens MRN: [id number] Date of Birth: [birth year] Referring Provider:  Wenda Low, MD  Encounter Date: 12/04/2014      PT End of Session - 12/04/14 1049    Visit Number 24   Number of Visits 33   Date for PT Re-Evaluation 12/29/14   Authorization Type Medicare [employer] Primary, [employer] secondary G codes required   PT Start Time 0802   PT Stop Time 0845   PT Time Calculation (min) 43 min      Past Medical History  Diagnosis Date  . Compression fracture   . Melanoma   . Bone cancer     t_spine T-5  . Prostate cancer 2002  . Skin cancer 1994    melanoma  right neck   . Multiple myeloma   . Allergy   . Anxiety   . Thyroid disease   . S/P radiation therapy 08/21/14-09/04/14    T4-6 25Gy/87f    Past Surgical History  Procedure Laterality Date  . Hernia repair    . Posterior cervical fusion/foraminotomy N/A 06/17/2014    Procedure: Thoracic five laminectomy for epidural tumor resection Thoracic 4-7 posterior lateral arthrodesis, segmental pedicle screw fixation.;  Surgeon: KAshok Pall MD;  [facility];  Service: Neurosurgery;  Laterality: N/A;    There were no vitals filed for this visit.  Visit Diagnosis:  Difficulty walking  Weakness of left leg  Posture abnormality      Subjective Assessment - 12/04/14 0806    Subjective Pt reports he is conitnuing to have problems with constipation. Otherwise no new complaints   Currently in Pain? Yes   Pain Score 1    Pain Location Chest   Pain Orientation Mid   Pain Descriptors / Indicators Aching   Pain Type Chronic pain   Pain Onset More than a month ago   Pain Frequency Intermittent      Therex: 3x20 clamshells with improved form today than last session 3x15 bridge hold + 15x marching each  leg. 3 sets Prone 3x10 MIN assisted bilateral simultaneous glut med isolated kickbacks Standing spinal extension stretch 3x30 seconds Standing forward lean with BUE support on counter followed by spinal extension 2x10  Gait training 2x230' with cane and CGA with improved knee extension in stance noted, decreased scissoring during first trial, but with fatigue during second trial, notable scissoring occurred requiring MOD A to steady pt                             PT Short Term Goals - 11/27/14 0939    PT SHORT TERM GOAL #1   Title Pt will demonstrate independence with HEP Target: 10/06/14   Status Achieved   PT SHORT TERM GOAL #2   Title Pt will increase BERG score to 36/56 for improving balance.  Target: 10/06/14   Status Achieved   PT SHORT TERM GOAL #3   Title Pt will Decrease TUG time to 14.69 seconds for improving efficiency with functional mobility using a single point cane. Target 10/06/14   Status Not Met  18.75 on 10/12/14   PT SHORT TERM GOAL #4   Title Pt will ambulate independently on indoor level surfaces with cane.  Target: 10/06/14   Status Not Met  requires CGA and demonstrates inefficient gait pattern  with cane   PT SHORT TERM GOAL #5   Title Pt will perform sit to stand without UE support, independently.   Target: 10/06/14   Status Not Met   PT SHORT TERM GOAL #6   Title Pt will increase Berg Balance Test score to 46/56 for decreased fall risk. Target 12/01/14   Status Not Met  Progressing; scored 45/56 on 11/27/14   PT SHORT TERM GOAL #7   Title Pt will decrease TUG time with LRAD to < 13.5 ft/sec for decreased fall risk. Target 12/01/14   Status Not Met   PT SHORT TERM GOAL #8   Title Pt will increase gait speed to 2.32 ft/sec with LRAD. Target 12/01/14   Status Partially Met  Gait speed increased to 2.63 with RW when walking "fast" but is 1.91 at "normal pace"   PT SHORT TERM GOAL #9   TITLE Pt will ambulate on indoor level surfaces with single  point cane with CGA x200' Target 12/01/14   Baseline Revised: to use single point cane rather than no assistive device due to nature of disease causing fragile bones and pt is unlikely to progress to ambulate without device.    Status Achieved           PT Long Term Goals - 11/27/14 1220    PT LONG TERM GOAL #1   Title Pt will increase Berg score to 49/56 for improved balance.  Target 12/29/14   Baseline extended target date to end of renewal   Status On-going  Scored 43/56 on 10/31/14   PT LONG TERM GOAL #2   Title Pt will ambulate modified independently on indoor level surfaces with cane. Target 12/29/14   Baseline revised to utilize cane rather than no assistive device   Status Revised   PT LONG TERM GOAL #3   Title Pt will ambulate with suprervision on outdoor unlevel grass and concrete wih single point cane. Target 12/29/14   Baseline Revised to allow for single point cane and Extended target to end of renewal   Status On-going   PT LONG TERM GOAL #4   Title Pt will decrease TUG time to <13.5 seconds with  cane for decreased fall risk and increased independence. TTarget 5/27/1h   Baseline revised to use cane   Status Revised  TUG with RW 14.83 seconds on 10/31/14   PT LONG TERM GOAL #5   Title Pt will increase gait speed to >2.63 ft/sec with LRAD for community ambulator status. Target 12/29/14   Baseline Revised to allow for assistive device and extended to end of renewal plan of care    PT LONG TERM GOAL #6   Title Increase FOTO ABC to 30% for improved balance conficdence. Target 12/29/14   Baseline extended to end of renewal plan of care   Status On-going               Plan - 12/04/14 1050    Clinical Impression Statement Pt is making progress toward goals, demonstrated improved knee extension in stance phase of gait, and improved ability to correctly perform the clamshells HEP for glut med strengthening at home.   PT Next Visit Plan balance training and hip flexor and  glut strength        Problem List Patient Active Problem List   Diagnosis Date Noted  . Gait disorder 09/05/2014  . Multiple myeloma   . Paraplegia 06/21/2014  . Postoperative anemia due to acute blood loss 06/21/2014  . Neoplasm of thoracic   spine 06/20/2014  . Spine metastasis 06/17/2014  . Thoracic spine tumor 06/17/2014  . Metastatic bone cancer 06/15/2014    , PT,DPT,NCS 12/04/2014 10:52 AM Phone [phone] FAX [phone]         [facility] [address] [place], [place], [address] Phone: [phone]   Fax:  [phone]     

## 2014-12-05 ENCOUNTER — Ambulatory Visit
Admission: RE | Admit: 2014-12-05 | Discharge: 2014-12-05 | Disposition: A | Discharge status: Home / Self Care | Payer: Medicare Other | Source: Ambulatory Visit | Attending: Radiation Oncology | Admitting: Radiation Oncology

## 2014-12-05 DIAGNOSIS — C7951 Secondary malignant neoplasm of bone: Secondary | ICD-10-CM | POA: Diagnosis not present

## 2014-12-05 DIAGNOSIS — C9000 Multiple myeloma not having achieved remission: Secondary | ICD-10-CM | POA: Diagnosis not present

## 2014-12-05 DIAGNOSIS — Z510 Encounter for antineoplastic radiation therapy: Secondary | ICD-10-CM | POA: Diagnosis not present

## 2014-12-06 ENCOUNTER — Encounter: Payer: Self-pay | Admitting: Nurse Practitioner

## 2014-12-06 ENCOUNTER — Ambulatory Visit
Admission: RE | Admit: 2014-12-06 | Discharge: 2014-12-06 | Disposition: A | Discharge status: Home / Self Care | Payer: Medicare Other | Source: Ambulatory Visit | Attending: Radiation Oncology | Admitting: Radiation Oncology

## 2014-12-06 ENCOUNTER — Other Ambulatory Visit: Payer: Self-pay | Admitting: Internal Medicine

## 2014-12-06 ENCOUNTER — Other Ambulatory Visit (HOSPITAL_BASED_OUTPATIENT_CLINIC_OR_DEPARTMENT_OTHER): Payer: Medicare Other

## 2014-12-06 ENCOUNTER — Other Ambulatory Visit: Payer: Self-pay | Admitting: Pharmacist

## 2014-12-06 ENCOUNTER — Ambulatory Visit (HOSPITAL_BASED_OUTPATIENT_CLINIC_OR_DEPARTMENT_OTHER): Payer: Medicare Other | Admitting: Nurse Practitioner

## 2014-12-06 ENCOUNTER — Ambulatory Visit (HOSPITAL_BASED_OUTPATIENT_CLINIC_OR_DEPARTMENT_OTHER): Payer: Medicare Other

## 2014-12-06 VITALS — BP 113/66 | HR 85 | Temp 97.8°F | Resp 18

## 2014-12-06 DIAGNOSIS — Z5112 Encounter for antineoplastic immunotherapy: Secondary | ICD-10-CM

## 2014-12-06 DIAGNOSIS — R609 Edema, unspecified: Secondary | ICD-10-CM

## 2014-12-06 DIAGNOSIS — C9000 Multiple myeloma not having achieved remission: Secondary | ICD-10-CM

## 2014-12-06 DIAGNOSIS — L03119 Cellulitis of unspecified part of limb: Secondary | ICD-10-CM

## 2014-12-06 DIAGNOSIS — C7951 Secondary malignant neoplasm of bone: Secondary | ICD-10-CM | POA: Diagnosis not present

## 2014-12-06 DIAGNOSIS — L0390 Cellulitis, unspecified: Secondary | ICD-10-CM | POA: Insufficient documentation

## 2014-12-06 DIAGNOSIS — L03116 Cellulitis of left lower limb: Secondary | ICD-10-CM

## 2014-12-06 DIAGNOSIS — Z510 Encounter for antineoplastic radiation therapy: Secondary | ICD-10-CM | POA: Diagnosis not present

## 2014-12-06 DIAGNOSIS — L03115 Cellulitis of right lower limb: Secondary | ICD-10-CM

## 2014-12-06 LAB — CBC WITH DIFFERENTIAL/PLATELET
BASO%: 0.5 % (ref 0.0–2.0)
BASOS ABS: 0 10*3/uL (ref 0.0–0.1)
EOS%: 7 % (ref 0.0–7.0)
Eosinophils Absolute: 0.2 10*3/uL (ref 0.0–0.5)
HCT: 37 % — ABNORMAL LOW (ref 38.4–49.9)
HGB: 12.1 g/dL — ABNORMAL LOW (ref 13.0–17.1)
LYMPH%: 12.6 % — ABNORMAL LOW (ref 14.0–49.0)
MCH: 31.6 pg (ref 27.2–33.4)
MCHC: 32.7 g/dL (ref 32.0–36.0)
MCV: 96.6 fL (ref 79.3–98.0)
MONO#: 0.6 10*3/uL (ref 0.1–0.9)
MONO%: 18.5 % — ABNORMAL HIGH (ref 0.0–14.0)
NEUT%: 61.4 % (ref 39.0–75.0)
NEUTROS ABS: 2.1 10*3/uL (ref 1.5–6.5)
Platelets: 103 10*3/uL — ABNORMAL LOW (ref 140–400)
RBC: 3.83 10*6/uL — ABNORMAL LOW (ref 4.20–5.82)
RDW: 15.3 % — ABNORMAL HIGH (ref 11.0–14.6)
WBC: 3.5 10*3/uL — AB (ref 4.0–10.3)
lymph#: 0.4 10*3/uL — ABNORMAL LOW (ref 0.9–3.3)

## 2014-12-06 LAB — BASIC METABOLIC PANEL (CC13)
ANION GAP: 10 meq/L (ref 3–11)
BUN: 19.2 mg/dL (ref 7.0–26.0)
CO2: 26 mEq/L (ref 22–29)
CREATININE: 0.9 mg/dL (ref 0.7–1.3)
Calcium: 8.4 mg/dL (ref 8.4–10.4)
Chloride: 104 mEq/L (ref 98–109)
EGFR: 80 mL/min/{1.73_m2} — ABNORMAL LOW (ref >90)
GLUCOSE: 108 mg/dL (ref 70–140)
Potassium: 3.9 mEq/L (ref 3.5–5.1)
Sodium: 140 mEq/L (ref 136–145)

## 2014-12-06 MED ORDER — BORTEZOMIB CHEMO SQ INJECTION 3.5 MG (2.5MG/ML)
1.3000 mg/m2 | Freq: Once | INTRAMUSCULAR | Status: AC
  Administered 2014-12-06: 2.5 mg via SUBCUTANEOUS
  Filled 2014-12-06: qty 2.5

## 2014-12-06 MED ORDER — ONDANSETRON HCL 8 MG PO TABS
8.0000 mg | ORAL_TABLET | Freq: Once | ORAL | Status: AC
  Administered 2014-12-06: 8 mg via ORAL

## 2014-12-06 MED ORDER — CEPHALEXIN 500 MG PO CAPS: 500.0000 mg | ORAL_CAPSULE | Freq: Three times a day (TID) | ORAL | Status: DC

## 2014-12-06 MED ORDER — ONDANSETRON HCL 8 MG PO TABS
ORAL_TABLET | ORAL | Status: AC
  Filled 2014-12-06: qty 1

## 2014-12-06 NOTE — Progress Notes (Signed)
Bilateral swelling and redness noted in pt's legs. Pt stated that he had been on antibiotics for  Skin infection. Cyndi Bacon,NP is to evaluate patient.

## 2014-12-06 NOTE — Patient Instructions (Signed)
[facility] Discharge Instructions for Patients Receiving Chemotherapy  Today you received the following chemotherapy agents:  Velcade  To help prevent nausea and vomiting after your treatment, we encourage you to take your nausea medication as prescribed.   If you develop nausea and vomiting that is not controlled by your nausea medication, call the clinic.   BELOW ARE SYMPTOMS THAT SHOULD BE REPORTED IMMEDIATELY:  *FEVER GREATER THAN 100.5 F  *CHILLS WITH OR WITHOUT FEVER  NAUSEA AND VOMITING THAT IS NOT CONTROLLED WITH YOUR NAUSEA MEDICATION  *UNUSUAL SHORTNESS OF BREATH  *UNUSUAL BRUISING OR BLEEDING  TENDERNESS IN MOUTH AND THROAT WITH OR WITHOUT PRESENCE OF ULCERS  *URINARY PROBLEMS  *BOWEL PROBLEMS  UNUSUAL RASH Items with * indicate a potential emergency and should be followed up as soon as possible.  Feel free to call the clinic you have any questions or concerns. The clinic phone number is [phone].  Please show the CHEMO ALERT CARD at check-in to the Emergency Department and triage nurse.   

## 2014-12-06 NOTE — Progress Notes (Signed)
SYMPTOM MANAGEMENT CLINIC   HPI: Jeremiah Owens 81 y.o. male diagnosed with multiple myeloma; with bone metastasis.  Currently undergoing Velcade injections and monthly Zometa infusions.  Patient is also undergoing radiation therapy as well.  Patient presented to the [facility] today to receive his Velcade injection.  He was recently treated for mild cellulitis to his bilateral anterior lower legs with Keflex antibiotics.  Patient is complaining of continued mild erythema and edema to his legs.  He denies any recent fevers or chills.  He denies any shortness of breath or cough.  He continues to take Lasix 20 mg twice daily.   HPI  ROS  Past Medical History  Diagnosis Date  . Compression fracture   . Melanoma   . Bone cancer     t_spine T-5  . Prostate cancer 2002  . Skin cancer 1994    melanoma  right neck   . Multiple myeloma   . Allergy   . Anxiety   . Thyroid disease   . S/P radiation therapy 08/21/14-09/04/14    T4-6 25Gy/82f    Past Surgical History  Procedure Laterality Date  . Hernia repair    . Posterior cervical fusion/foraminotomy N/A 06/17/2014    Procedure: Thoracic five laminectomy for epidural tumor resection Thoracic 4-7 posterior lateral arthrodesis, segmental pedicle screw fixation.;  Surgeon: KAshok Pall MD;  [facility];  Service: Neurosurgery;  Laterality: N/A;    has Metastatic bone cancer; Spine metastasis; Thoracic spine tumor; Neoplasm of thoracic spine; Paraplegia; Postoperative anemia due to acute blood loss; Multiple myeloma; Gait disorder; Cellulitis; and Peripheral edema on his problem list.    is allergic to iodine.    Medication List       This list is accurate as of: 12/06/14  3:34 PM.  Always use your most recent med list.               acyclovir 400 MG tablet  Commonly known as:  ZOVIRAX  TAKE 1 TABLET BY MOUTH TWICE A DAY     CALTRATE 600+D PO  Take 600 mg by mouth 2 (two) times daily.     CENTRUM SILVER PO    Take 1 tablet by mouth daily.     cephALEXin 500 MG capsule  Commonly known as:  KEFLEX  Take 1 capsule (500 mg total) by mouth 3 (three) times daily.     cholecalciferol 1000 UNITS tablet  Commonly known as:  VITAMIN D  Take 1,000 Units by mouth at bedtime.     dexamethasone 4 MG tablet  Commonly known as:  DECADRON  10 tab every week, start with chemo     furosemide 20 MG tablet  Commonly known as:  LASIX  Take 1 tablet (20 mg total) by mouth daily. As needed for swelling     hyaluronate sodium Gel  Apply 1 application topically daily. Apply to treated area on skin after rad txs daily and prn     HYDROcodone-acetaminophen 5-325 MG per tablet  Commonly known as:  NORCO  Take 1 tablet by mouth every 6 (six) hours as needed for moderate pain.     HYDROCORTISONE (TOPICAL) 2.5 % Soln  Apply 1 application topically daily as needed.     lenalidomide 25 MG capsule  Commonly known as:  REVLIMID  Take 1 capsule (25 mg total) by mouth daily. Take one capsule ( 25 mg) by mouth for 21 days every 4 weeks-11/29/14 Authorization number= [id number] adult male  levothyroxine 25 MCG tablet  Commonly known as:  SYNTHROID, LEVOTHROID  Take 1 tablet (25 mcg total) by mouth daily.     methocarbamol 500 MG tablet  Commonly known as:  ROBAXIN  TAKE 1 TABLET (500 MG TOTAL) BY MOUTH EVERY 6 (SIX) HOURS AS NEEDED FOR MUSCLE SPASMS.     NON FORMULARY  Prunes prn     omeprazole 40 MG capsule  Commonly known as:  PRILOSEC  Take 40 mg by mouth daily. In the afternoon     potassium chloride 10 MEQ tablet  Commonly known as:  K-DUR,KLOR-CON  Take 10 mEq by mouth daily.     prochlorperazine 10 MG tablet  Commonly known as:  COMPAZINE  TAKE 1 TABLET BY MOUTH EVERY 6 HOURS AS NEEDED FOR NAUSEA AND VOMITING     ranitidine 150 MG tablet  Commonly known as:  ZANTAC  Take 150 mg by mouth at bedtime.     REFRESH OP  Apply 1 drop to eye daily as needed (dry/irritated eyes).     tamsulosin 0.4  MG Caps capsule  Commonly known as:  FLOMAX  Take 1 capsule (0.4 mg total) by mouth at bedtime.     warfarin 2 MG tablet  Commonly known as:  COUMADIN  Take 1 tablet (2 mg total) by mouth daily.         PHYSICAL EXAMINATION  Oncology Vitals 12/06/2014 12/01/2014 11/29/2014 11/22/2014 11/22/2014 11/16/2014 11/15/2014  Height - 173 cm 175 cm - - - 175 cm  Weight - 78.427 kg 76.839 kg 77.111 kg - - 77.248 kg  Weight (lbs) - 172 lbs 14 oz 169 lbs 6 oz 170 lbs - - 170 lbs 5 oz  BMI (kg/m2) - 26.29 kg/m2 25.02 kg/m2 - - - 25.15 kg/m2  Temp 97.8 98.6 98.9 98.2 97.8 - 97.4  Pulse 85 78 72 74 70 78 73  Resp _0 - - - 18  SpO2 - 100 98 100 - - 100  BSA (m2) - 1.94 m2 1.93 m2 - - - 1.94 m2   BP Readings from Last 3 Encounters:  12/06/14 113/66  11/29/14 135/56  11/22/14 110/50    Physical Exam  Constitutional: He is oriented to person, place, and time and well-developed, well-nourished, and in no distress.  HENT:  Head: Normocephalic and atraumatic.  Eyes: Conjunctivae and EOM are normal. Pupils are equal, round, and reactive to light. Right eye exhibits no discharge. Left eye exhibits no discharge. No scleral icterus.  Neck: Normal range of motion.  Pulmonary/Chest: Effort normal. No respiratory distress.  Musculoskeletal: Normal range of motion. He exhibits edema. He exhibits no tenderness.  +2 edema to bilateral lower extremities.  Neurological: He is alert and oriented to person, place, and time.  Skin: Skin is warm and dry. No rash noted. There is erythema. No pallor.  +2 bilateral lower extremity edema.  Bilateral anterior lower extremities with some mild erythema to his shins.  The skin is very dry and edematous in general.  Minimal to no warmth to site.  No red streaks noted.  Also, no tenderness to site.  Psychiatric: Affect normal.  Nursing note and vitals reviewed.   LABORATORY DATA:. Appointment on 12/06/2014  Component Date Value Ref Range Status  . Sodium 12/06/2014  140  136 - 145 mEq/L Final  . Potassium 12/06/2014 3.9  3.5 - 5.1 mEq/L Final  . Chloride 12/06/2014 104  98 - 109 mEq/L Final  . CO2 12/06/2014 26  22 -  29 mEq/L Final  . Glucose 12/06/2014 108  70 - 140 mg/dl Final  . BUN 12/06/2014 19.2  7.0 - 26.0 mg/dL Final  . Creatinine 12/06/2014 0.9  0.7 - 1.3 mg/dL Final  . Calcium 12/06/2014 8.4  8.4 - 10.4 mg/dL Final  . Anion Gap 12/06/2014 10  3 - 11 mEq/L Final  . EGFR 12/06/2014 80* >90 ml/min/1.73 m2 Final   eGFR is calculated using the CKD-EPI Creatinine Equation (2009)  . WBC 12/06/2014 3.5* 4.0 - 10.3 10e3/uL Final  . NEUT# 12/06/2014 2.1  1.5 - 6.5 10e3/uL Final  . HGB 12/06/2014 12.1* 13.0 - 17.1 g/dL Final  . HCT 12/06/2014 37.0* 38.4 - 49.9 % Final  . Platelets 12/06/2014 103* 140 - 400 10e3/uL Final  . MCV 12/06/2014 96.6  79.3 - 98.0 fL Final  . MCH 12/06/2014 31.6  27.2 - 33.4 pg Final  . MCHC 12/06/2014 32.7  32.0 - 36.0 g/dL Final  . RBC 12/06/2014 3.83* 4.20 - 5.82 10e6/uL Final  . RDW 12/06/2014 15.3* 11.0 - 14.6 % Final  . lymph# 12/06/2014 0.4* 0.9 - 3.3 10e3/uL Final  . MONO# 12/06/2014 0.6  0.1 - 0.9 10e3/uL Final  . Eosinophils Absolute 12/06/2014 0.2  0.0 - 0.5 10e3/uL Final  . Basophils Absolute 12/06/2014 0.0  0.0 - 0.1 10e3/uL Final  . NEUT% 12/06/2014 61.4  39.0 - 75.0 % Final  . LYMPH% 12/06/2014 12.6* 14.0 - 49.0 % Final  . MONO% 12/06/2014 18.5* 0.0 - 14.0 % Final  . EOS% 12/06/2014 7.0  0.0 - 7.0 % Final  . BASO% 12/06/2014 0.5  0.0 - 2.0 % Final     RADIOGRAPHIC STUDIES: No results found.  ASSESSMENT/PLAN:    Multiple myeloma Patient presented to the [facility] today to receive his weekly Velcade injection.  Patient last received his monthly Zometa infusion on 11/29/2014.  Patient continues to receive daily radiation treatments as well.  His final radiation treatment is scheduled for 12/12/2014.  Patient has plans to return on 12/13/2014 for labs and his next Velcade injection.  He will be  due for his next Zometa infusion around 12/27/2014.   Cellulitis Patient was recently treated with Keflex antibiotics for some mild cellulitis to his bilateral lower anterior legs.  Patient states that they have somewhat improved; that he continues with some mild erythema and trace warmth.  On exam-patient with bilateral lower extremity peripheral edema; and trace erythema/warmth to anterior bilateral lower legs from below the knee to his ankles.  Will prescribe Keflex antibiotics for another 7 days to see if this helps.  Also, advised patient to apply Aquaphor to his legs as often as possible; and also consider wearing his compression stockings.  Patient may eventually require a wound care clinic referral if legs do not improve.   Peripheral edema Patient has history of chronic bilateral lower extremity peripheral edema.  Patient continues to take Lasix 20 mg twice daily.  Patient denies any shortness of breath or cough.  On exam-patient does have +2 bilateral lower extremity edema.  Advised patient to continue taking the Lasix as previously directed.  Also, patient was advised to keep his legs elevated above the level of his heart whenever possible.  He should also begin wearing his compression stockings again as well.     Patient stated understanding of all instructions; and was in agreement with this plan of care. The patient knows to call the clinic with any problems, questions or concerns.   Review/collaboration with Dr.  Mohamed regarding all aspects of patient's visit today.   Total time spent with patient was 15 minutes;  with greater than 75 percent of that time spent in face to face counseling regarding patient's symptoms,  and coordination of care and follow up.  Disclaimer: This note was dictated with voice recognition software. Similar sounding words can inadvertently be transcribed and may not be corrected upon review.   Drue Second, NP 12/06/2014

## 2014-12-06 NOTE — Assessment & Plan Note (Addendum)
Patient has history of chronic bilateral lower extremity peripheral edema.  Patient continues to take Lasix 20 mg twice daily.  Patient denies any shortness of breath or cough.  On exam-patient does have +2 bilateral lower extremity edema.  Advised patient to continue taking the Lasix as previously directed.  Also, patient was advised to keep his legs elevated above the level of his heart whenever possible.  He should also begin wearing his compression stockings again as well.

## 2014-12-06 NOTE — Assessment & Plan Note (Signed)
Patient was recently treated with Keflex antibiotics for some mild cellulitis to his bilateral lower anterior legs.  Patient states that they have somewhat improved; that he continues with some mild erythema and trace warmth.  On exam-patient with bilateral lower extremity peripheral edema; and trace erythema/warmth to anterior bilateral lower legs from below the knee to his ankles.  Will prescribe Keflex antibiotics for another 7 days to see if this helps.  Also, advised patient to apply Aquaphor to his legs as often as possible; and also consider wearing his compression stockings.  Patient may eventually require a wound care clinic referral if legs do not improve.

## 2014-12-06 NOTE — Assessment & Plan Note (Signed)
Patient presented to the [facility] today to receive his weekly Velcade injection.  Patient last received his monthly Zometa infusion on 11/29/2014.  Patient continues to receive daily radiation treatments as well.  His final radiation treatment is scheduled for 12/12/2014.  Patient has plans to return on 12/13/2014 for labs and his next Velcade injection.  He will be due for his next Zometa infusion around 12/27/2014.

## 2014-12-07 ENCOUNTER — Ambulatory Visit
Admission: RE | Admit: 2014-12-07 | Discharge: 2014-12-07 | Disposition: A | Discharge status: Home / Self Care | Payer: Medicare Other | Source: Ambulatory Visit | Attending: Radiation Oncology | Admitting: Radiation Oncology

## 2014-12-07 DIAGNOSIS — Z510 Encounter for antineoplastic radiation therapy: Secondary | ICD-10-CM | POA: Diagnosis not present

## 2014-12-07 DIAGNOSIS — C7951 Secondary malignant neoplasm of bone: Secondary | ICD-10-CM | POA: Diagnosis not present

## 2014-12-07 DIAGNOSIS — C9000 Multiple myeloma not having achieved remission: Secondary | ICD-10-CM | POA: Diagnosis not present

## 2014-12-08 ENCOUNTER — Ambulatory Visit
Admission: RE | Admit: 2014-12-08 | Discharge: 2014-12-08 | Disposition: A | Discharge status: Home / Self Care | Payer: Medicare Other | Source: Ambulatory Visit | Attending: Radiation Oncology | Admitting: Radiation Oncology

## 2014-12-08 ENCOUNTER — Ambulatory Visit: Payer: Medicare Other

## 2014-12-08 ENCOUNTER — Encounter: Payer: Self-pay | Admitting: Radiation Oncology

## 2014-12-08 ENCOUNTER — Telehealth: Payer: Self-pay

## 2014-12-08 VITALS — BP 115/52 | HR 70 | Temp 98.6°F | Resp 20 | Wt 171.1 lb

## 2014-12-08 DIAGNOSIS — R6889 Other general symptoms and signs: Secondary | ICD-10-CM | POA: Diagnosis not present

## 2014-12-08 DIAGNOSIS — R279 Unspecified lack of coordination: Secondary | ICD-10-CM

## 2014-12-08 DIAGNOSIS — Z7409 Other reduced mobility: Secondary | ICD-10-CM | POA: Diagnosis not present

## 2014-12-08 DIAGNOSIS — R262 Difficulty in walking, not elsewhere classified: Secondary | ICD-10-CM

## 2014-12-08 DIAGNOSIS — R29898 Other symptoms and signs involving the musculoskeletal system: Secondary | ICD-10-CM

## 2014-12-08 DIAGNOSIS — C9000 Multiple myeloma not having achieved remission: Secondary | ICD-10-CM | POA: Diagnosis not present

## 2014-12-08 DIAGNOSIS — C419 Malignant neoplasm of bone and articular cartilage, unspecified: Secondary | ICD-10-CM

## 2014-12-08 DIAGNOSIS — C7951 Secondary malignant neoplasm of bone: Secondary | ICD-10-CM | POA: Diagnosis not present

## 2014-12-08 DIAGNOSIS — D492 Neoplasm of unspecified behavior of bone, soft tissue, and skin: Secondary | ICD-10-CM | POA: Diagnosis not present

## 2014-12-08 DIAGNOSIS — M2560 Stiffness of unspecified joint, not elsewhere classified: Secondary | ICD-10-CM | POA: Diagnosis not present

## 2014-12-08 DIAGNOSIS — Z510 Encounter for antineoplastic radiation therapy: Secondary | ICD-10-CM | POA: Diagnosis not present

## 2014-12-08 NOTE — Telephone Encounter (Signed)
Pt stated maybe a little better, not as much heat. He has only had 1.5 days of keflex so far. He will call if worsens. He has appt on Wed and will have "another set of eyes besides ours" look at that time.

## 2014-12-08 NOTE — Progress Notes (Addendum)
Weekly rad  txs / scapula, patient has restarted Keflex 12/06/14, swelling in legs, redened, rash looking, looks like blisters forming bumps on both legs, hot to touch, appetite good, uses walker, slow steady gait, energy level poor, pain in legs, exercise today at rehab, goes 2x week BP 115/52 mmHg  Pulse 70  Temp(Src) 98.6 F (37 C) (Oral)  Resp 20  Wt 171 lb 1.6 oz (77.61 kg)  Wt Readings from Last 3 Encounters:  12/08/14 171 lb 1.6 oz (77.61 kg)  11/29/14 169 lb 6.4 oz (76.839 kg)  11/22/14 170 lb (77.111 kg)    12:56 PM

## 2014-12-08 NOTE — Therapy (Signed)
[facility] [address] [place], [place], [address] Phone: [phone]   Fax:  [phone]  Physical Therapy Treatment  Patient Details  Name: Jeremiah Owens MRN: [id number] Date of Birth: [birth year] Referring Provider:  Wenda Low, MD  Encounter Date: 12/08/2014      PT End of Session - 12/08/14 1222    Visit Number 25   Number of Visits 33   Date for PT Re-Evaluation 12/29/14   Authorization Type Medicare [employer] Primary, [employer] secondary G codes required   PT Start Time 0932   PT Stop Time 1015   PT Time Calculation (min) 43 min      Past Medical History  Diagnosis Date  . Compression fracture   . Melanoma   . Bone cancer     t_spine T-5  . Prostate cancer 2002  . Skin cancer 1994    melanoma  right neck   . Multiple myeloma   . Allergy   . Anxiety   . Thyroid disease   . S/P radiation therapy 08/21/14-09/04/14    T4-6 25Gy/70f    Past Surgical History  Procedure Laterality Date  . Hernia repair    . Posterior cervical fusion/foraminotomy N/A 06/17/2014    Procedure: Thoracic five laminectomy for epidural tumor resection Thoracic 4-7 posterior lateral arthrodesis, segmental pedicle screw fixation.;  Surgeon: KAshok Pall MD;  [facility];  Service: Neurosurgery;  Laterality: N/A;    There were no vitals filed for this visit.  Visit Diagnosis:  Difficulty walking  Weakness of left leg  Lack of coordination      Subjective Assessment - 12/08/14 0932    Subjective Pt reports his balance is more of an issue in the mornaing than at other times of the day. He started on another antibiotic for the possible infection of the left leg. (cellulitis?)   Currently in Pain? Yes   Pain Score 1    Pain Location Chest   Pain Orientation Mid   Pain Descriptors / Indicators Aching   Pain Type Chronic pain   Pain Onset More than a month ago   Pain Frequency Intermittent      Gait training  with cane [phone] with supervision and without scissoring noted. After 500' pt began to fatigue and became mildly unsteady but able to maintain balance. Later 2x50' with same improved stability and gait pattern.  Alternate foot taps on 6" step with BUE support; progressed to 9" step, then progressed to heel taps, then progressed to perform on 6" then 9" step without UE support with MIN A to steady, progressed again to perform taps with stance leg on airex  Stepping over obstacle initially attempted 2"x4" with poor foot clearance. Then 1/2" x2" with BUE support forward/backward stepping, then single UE support with MIN A then no UE support with MIN A with verbal cues for appropraite weight shift                             PT Short Term Goals - 11/27/14 0939    PT SHORT TERM GOAL #1   Title Pt will demonstrate independence with HEP Target: 10/06/14   Status Achieved   PT SHORT TERM GOAL #2   Title Pt will increase BERG score to 36/56 for improving balance.  Target: 10/06/14   Status Achieved   PT SHORT TERM GOAL #3   Title Pt will Decrease TUG time to 14.69 seconds for  improving efficiency with functional mobility using a single point cane. Target 10/06/14   Status Not Met  18.75 on 10/12/14   PT SHORT TERM GOAL #4   Title Pt will ambulate independently on indoor level surfaces with cane.  Target: 10/06/14   Status Not Met  requires CGA and demonstrates inefficient gait pattern with cane   PT SHORT TERM GOAL #5   Title Pt will perform sit to stand without UE support, independently.   Target: 10/06/14   Status Not Met   PT SHORT TERM GOAL #6   Title Pt will increase Berg Balance Test score to 46/56 for decreased fall risk. Target 12/01/14   Status Not Met  Progressing; scored 45/56 on 11/27/14   PT SHORT TERM GOAL #7   Title Pt will decrease TUG time with LRAD to < 13.5 ft/sec for decreased fall risk. Target 12/01/14   Status Not Met   PT SHORT TERM GOAL #8   Title Pt will  increase gait speed to 2.32 ft/sec with LRAD. Target 12/01/14   Status Partially Met  Gait speed increased to 2.63 with RW when walking "fast" but is 1.91 at "normal pace"   PT SHORT TERM GOAL #9   TITLE Pt will ambulate on indoor level surfaces with single point cane with CGA x200' Target 12/01/14   Baseline Revised: to use single point cane rather than no assistive device due to nature of disease causing fragile bones and pt is unlikely to progress to ambulate without device.    Status Achieved           PT Long Term Goals - 11/27/14 1220    PT LONG TERM GOAL #1   Title Pt will increase Berg score to 49/56 for improved balance.  Target 12/29/14   Baseline extended target date to end of renewal   Status On-going  Scored 43/56 on 10/31/14   PT LONG TERM GOAL #2   Title Pt will ambulate modified independently on indoor level surfaces with cane. Target 12/29/14   Baseline revised to utilize cane rather than no assistive device   Status Revised   PT LONG TERM GOAL #3   Title Pt will ambulate with suprervision on outdoor unlevel grass and concrete wih single point cane. Target 12/29/14   Baseline Revised to allow for single point cane and Extended target to end of renewal   Status On-going   PT LONG TERM GOAL #4   Title Pt will decrease TUG time to <13.5 seconds with  cane for decreased fall risk and increased independence. TTarget 5/27/1h   Baseline revised to use cane   Status Revised  TUG with RW 14.83 seconds on 10/31/14   PT LONG TERM GOAL #5   Title Pt will increase gait speed to >2.63 ft/sec with LRAD for community ambulator status. Target 12/29/14   Baseline Revised to allow for assistive device and extended to end of renewal plan of care    PT LONG TERM GOAL #6   Title Increase FOTO ABC to 30% for improved balance conficdence. Target 12/29/14   Baseline extended to end of renewal plan of care   Status On-going               Plan - 12/08/14 1223    Clinical Impression  Statement Pt demonstrated significnat improvement in balance with cane, and balance with other balance activities. Also demonstrted improvement with ability to flex hip and lift each leg to tap 6" and 9" stool, but has  decreased hip flexion/foot clearance in gait pattern. Some of this significant improvement is possibly due to this week being an "off" week for one of this Chemo medications, which increases his general energy and strength. Continue per plan of care.    PT Next Visit Plan balance training and hip flexor and glut strength        Problem List Patient Active Problem List   Diagnosis Date Noted  . Cellulitis 12/06/2014  . Peripheral edema 12/06/2014  . Gait disorder 09/05/2014  . Multiple myeloma   . Paraplegia 06/21/2014  . Postoperative anemia due to acute blood loss 06/21/2014  . 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 Spine metastasis 06/17/2014  . Thoracic spine tumor 06/17/2014  . Metastatic bone cancer 06/15/2014   Delrae Sawyers, PT,DPT,NCS 12/08/2014 12:28 PM Phone [phone] FAX [phone]         [facility] [address] [place], [place], [address] Phone: [phone]   Fax:  [phone]

## 2014-12-09 NOTE — Progress Notes (Signed)
Department of Radiation Oncology  Phone:  [phone] Fax:        [phone]  Weekly Treatment Note    Name: Jeremiah Owens Date: 12/09/2014 MRN: [id number] DOB: [birth year]   Current dose: 20 Gy  Current fraction: 8   MEDICATIONS: Current Outpatient Prescriptions  Medication Sig Dispense Refill  . acyclovir (ZOVIRAX) 400 MG tablet TAKE 1 TABLET BY MOUTH TWICE A DAY 60 tablet 2  . Calcium Carbonate-Vitamin D (CALTRATE 600+D PO) Take 600 mg by mouth 2 (two) times daily.    . cephALEXin (KEFLEX) 500 MG capsule Take 1 capsule (500 mg total) by mouth 3 (three) times daily. 21 capsule 0  . cholecalciferol (VITAMIN D) 1000 UNITS tablet Take 1,000 Units by mouth at bedtime.     Marland Kitchen dexamethasone (DECADRON) 4 MG tablet 10 TAB EVERY WEEK, START WITH CHEMO 40 tablet 4  . furosemide (LASIX) 20 MG tablet Take 1 tablet (20 mg total) by mouth daily. As needed for swelling (Patient taking differently: Take 20 mg by mouth 2 (two) times daily. As needed for swelling) 10 tablet 0  . hyaluronate sodium (RADIAPLEXRX) GEL Apply 1 application topically daily. Apply to treated area on skin after rad txs daily and prn    . HYDROcodone-acetaminophen (NORCO) 5-325 MG per tablet Take 1 tablet by mouth every 6 (six) hours as needed for moderate pain. 60 tablet 0  . HYDROCORTISONE, TOPICAL, 2.5 % SOLN Apply 1 application topically daily as needed.     Marland Kitchen lenalidomide (REVLIMID) 25 MG capsule Take 1 capsule (25 mg total) by mouth daily. Take one capsule ( 25 mg) by mouth for 21 days every 4 weeks-11/29/14 Authorization number= [id number]  adult male 21 capsule 0  . levothyroxine (SYNTHROID, LEVOTHROID) 25 MCG tablet Take 1 tablet (25 mcg total) by mouth daily. 30 tablet 4  . methocarbamol (ROBAXIN) 500 MG tablet TAKE 1 TABLET (500 MG TOTAL) BY MOUTH EVERY 6 (SIX) HOURS AS NEEDED FOR MUSCLE SPASMS. 60 tablet 0  . Multiple Vitamins-Minerals (CENTRUM SILVER PO) Take 1 tablet by mouth daily.    . NON FORMULARY Prunes prn     . omeprazole (PRILOSEC) 40 MG capsule Take 40 mg by mouth daily. In the afternoon  11  . Polyvinyl Alcohol-Povidone (REFRESH OP) Apply 1 drop to eye daily as needed (dry/irritated eyes).    . potassium chloride (K-DUR,KLOR-CON) 10 MEQ tablet Take 10 mEq by mouth daily.    . prochlorperazine (COMPAZINE) 10 MG tablet TAKE 1 TABLET BY MOUTH EVERY 6 HOURS AS NEEDED FOR NAUSEA AND VOMITING 30 tablet 0  . ranitidine (ZANTAC) 150 MG tablet Take 150 mg by mouth at bedtime.   11  . tamsulosin (FLOMAX) 0.4 MG CAPS capsule Take 1 capsule (0.4 mg total) by mouth at bedtime. 30 capsule 3  . warfarin (COUMADIN) 2 MG tablet Take 1 tablet (2 mg total) by mouth daily. 30 tablet 3   No current facility-administered medications for this encounter.     ALLERGIES: Iodine   LABORATORY DATA:  Lab Results  Component Value Date   WBC 3.5* 12/06/2014   HGB 12.1* 12/06/2014   HCT 37.0* 12/06/2014   MCV 96.6 12/06/2014   PLT 103* 12/06/2014   Lab Results  Component Value Date   NA 140 12/06/2014   K 3.9 12/06/2014   CL 97 07/04/2014   CO2 26 12/06/2014   Lab Results  Component Value Date   ALT 20 11/22/2014   AST 19 11/22/2014   ALKPHOS 83 11/22/2014  BILITOT 0.53 11/22/2014     NARRATIVE: Jeremiah Owens was seen today for weekly treatment management. The chart was checked and the patient's films were reviewed.  The patient states he is doing well. No difficulties with treatment so far. Pain in the right scapular region is much improved.  PHYSICAL EXAMINATION: weight is 171 lb 1.6 oz (77.61 kg). His oral temperature is 98.6 F (37 C). His blood pressure is 115/52 and his pulse is 70. His respiration is 20.        ASSESSMENT: The patient is doing satisfactorily with treatment.  PLAN: We will continue with the patient's radiation treatment as planned. The patient will complete treatment early next week and see me in one month.

## 2014-12-11 ENCOUNTER — Ambulatory Visit
Admission: RE | Admit: 2014-12-11 | Discharge: 2014-12-11 | Disposition: A | Discharge status: Home / Self Care | Payer: Medicare Other | Source: Ambulatory Visit | Attending: Radiation Oncology | Admitting: Radiation Oncology

## 2014-12-11 ENCOUNTER — Ambulatory Visit: Payer: Medicare Other

## 2014-12-11 DIAGNOSIS — R6889 Other general symptoms and signs: Secondary | ICD-10-CM | POA: Diagnosis not present

## 2014-12-11 DIAGNOSIS — C9000 Multiple myeloma not having achieved remission: Secondary | ICD-10-CM | POA: Diagnosis not present

## 2014-12-11 DIAGNOSIS — M2560 Stiffness of unspecified joint, not elsewhere classified: Secondary | ICD-10-CM | POA: Diagnosis not present

## 2014-12-11 DIAGNOSIS — R279 Unspecified lack of coordination: Secondary | ICD-10-CM

## 2014-12-11 DIAGNOSIS — Z510 Encounter for antineoplastic radiation therapy: Secondary | ICD-10-CM | POA: Diagnosis not present

## 2014-12-11 DIAGNOSIS — D492 Neoplasm of unspecified behavior of bone, soft tissue, and skin: Secondary | ICD-10-CM | POA: Diagnosis not present

## 2014-12-11 DIAGNOSIS — Z7409 Other reduced mobility: Secondary | ICD-10-CM | POA: Diagnosis not present

## 2014-12-11 DIAGNOSIS — C7951 Secondary malignant neoplasm of bone: Secondary | ICD-10-CM | POA: Diagnosis not present

## 2014-12-11 DIAGNOSIS — R29898 Other symptoms and signs involving the musculoskeletal system: Secondary | ICD-10-CM

## 2014-12-11 NOTE — Therapy (Signed)
[facility] [address] [place], [place], [address] Phone: [phone]   Fax:  [phone]  Physical Therapy Treatment  Patient Details  Name: Jeremiah Owens MRN: [id number] Date of Birth: 25-Oct-1929 Referring Provider:  Wenda Low, MD  Encounter Date: 12/11/2014      PT End of Session - 12/11/14 1137    Visit Number 26   Number of Visits 33   Date for PT Re-Evaluation 12/29/14   Authorization Type Medicare [employer] Primary, [employer] secondary G codes required   PT Start Time 1105   PT Stop Time 1145   PT Time Calculation (min) 40 min      Past Medical History  Diagnosis Date  . Compression fracture   . Melanoma   . Bone cancer     t_spine T-5  . Prostate cancer 2002  . Skin cancer 1994    melanoma  right neck   . Multiple myeloma   . Allergy   . Anxiety   . Thyroid disease   . S/P radiation therapy 08/21/14-09/04/14    T4-6 25Gy/50f    Past Surgical History  Procedure Laterality Date  . Hernia repair    . Posterior cervical fusion/foraminotomy N/A 06/17/2014    Procedure: Thoracic five laminectomy for epidural tumor resection Thoracic 4-7 posterior lateral arthrodesis, segmental pedicle screw fixation.;  Surgeon: KAshok Pall MD;  [facility];  Service: Neurosurgery;  Laterality: N/A;    There were no vitals filed for this visit.  Visit Diagnosis:  Lack of coordination  Weakness of left leg      Subjective Assessment - 12/11/14 1108    Subjective Pt reports he had a very busy weekend. He started his chemo up again on Saturday. Believes he may not have as much energy as he had on Friday.   Currently in Pain? Yes   Pain Score 1    Pain Location Shoulder   Pain Orientation Right   Pain Descriptors / Indicators Sore   Pain Type Acute pain   Pain Onset In the past 7 days   Pain Frequency Intermittent         Neuro re-ed: Balance on compliant surface with CGA/MIN A  horizontal  head turns with eyes closed Vertical head turns with eyes closed Heel/toe raises with assist initially to maintain knee extension Feet apart with 10x turning to look over shoulder Feet together with 10x turning to look over shoulder  3x10 supine straight leg raises each side with tactile cues for abdominal bracing 3x10 sidelying hip abduction each side with assist to maintain pelvis rolled forward to isolate glut med.                         PT Short Term Goals - 11/27/14 0939    PT SHORT TERM GOAL #1   Title Pt will demonstrate independence with HEP Target: 10/06/14   Status Achieved   PT SHORT TERM GOAL #2   Title Pt will increase BERG score to 36/56 for improving balance.  Target: 10/06/14   Status Achieved   PT SHORT TERM GOAL #3   Title Pt will Decrease TUG time to 14.69 seconds for improving efficiency with functional mobility using a single point cane. Target 10/06/14   Status Not Met  18.75 on 10/12/14   PT SHORT TERM GOAL #4   Title Pt will ambulate independently on indoor level surfaces with cane.  Target: 10/06/14   Status Not Met  requires CGA and demonstrates inefficient gait pattern with cane   PT SHORT TERM GOAL #5   Title Pt will perform sit to stand without UE support, independently.   Target: 10/06/14   Status Not Met   PT SHORT TERM GOAL #6   Title Pt will increase Berg Balance Test score to 46/56 for decreased fall risk. Target 12/01/14   Status Not Met  Progressing; scored 45/56 on 11/27/14   PT SHORT TERM GOAL #7   Title Pt will decrease TUG time with LRAD to < 13.5 ft/sec for decreased fall risk. Target 12/01/14   Status Not Met   PT SHORT TERM GOAL #8   Title Pt will increase gait speed to 2.32 ft/sec with LRAD. Target 12/01/14   Status Partially Met  Gait speed increased to 2.63 with RW when walking "fast" but is 1.91 at "normal pace"   PT SHORT TERM GOAL #9   TITLE Pt will ambulate on indoor level surfaces with single point cane with CGA x200'  Target 12/01/14   Baseline Revised: to use single point cane rather than no assistive device due to nature of disease causing fragile bones and pt is unlikely to progress to ambulate without device.    Status Achieved           PT Long Term Goals - 11/27/14 1220    PT LONG TERM GOAL #1   Title Pt will increase Berg score to 49/56 for improved balance.  Target 12/29/14   Baseline extended target date to end of renewal   Status On-going  Scored 43/56 on 10/31/14   PT LONG TERM GOAL #2   Title Pt will ambulate modified independently on indoor level surfaces with cane. Target 12/29/14   Baseline revised to utilize cane rather than no assistive device   Status Revised   PT LONG TERM GOAL #3   Title Pt will ambulate with suprervision on outdoor unlevel grass and concrete wih single point cane. Target 12/29/14   Baseline Revised to allow for single point cane and Extended target to end of renewal   Status On-going   PT LONG TERM GOAL #4   Title Pt will decrease TUG time to <13.5 seconds with  cane for decreased fall risk and increased independence. TTarget 5/27/1h   Baseline revised to use cane   Status Revised  TUG with RW 14.83 seconds on 10/31/14   PT LONG TERM GOAL #5   Title Pt will increase gait speed to >2.63 ft/sec with LRAD for community ambulator status. Target 12/29/14   Baseline Revised to allow for assistive device and extended to end of renewal plan of care    PT LONG TERM GOAL #6   Title Increase FOTO ABC to 30% for improved balance conficdence. Target 12/29/14   Baseline extended to end of renewal plan of care   Status On-going               Plan - 12/11/14 1137    Clinical Impression Statement As expected, pt had decreased energy today compared to friday as he has started up again on his chemotherapy. Despite this fact, pt appears to have increased hip flexor and hip abductor strength  with increased ability to lift legs against gravity   PT Next Visit Plan Single  limb stance balance training, continued hip strengthening, gait training with cane        Problem List Patient Active Problem List   Diagnosis Date Noted  . Cellulitis 12/06/2014  .  Peripheral edema 12/06/2014  . Gait disorder 09/05/2014  . Multiple myeloma   . Paraplegia 06/21/2014  . Postoperative anemia due to acute blood loss 06/21/2014  . 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 Spine metastasis 06/17/2014  . Thoracic spine tumor 06/17/2014  . Metastatic bone cancer 06/15/2014   Delrae Sawyers, PT,DPT,NCS 12/11/2014 11:57 AM Phone [phone] FAX [phone]         [facility] [address] [place], [place], [address] Phone: [phone]   Fax:  [phone]

## 2014-12-12 ENCOUNTER — Ambulatory Visit
Admission: RE | Admit: 2014-12-12 | Discharge: 2014-12-12 | Disposition: A | Discharge status: Home / Self Care | Payer: Medicare Other | Source: Ambulatory Visit | Attending: Radiation Oncology | Admitting: Radiation Oncology

## 2014-12-12 ENCOUNTER — Encounter: Payer: Self-pay | Admitting: Radiation Oncology

## 2014-12-12 DIAGNOSIS — C9000 Multiple myeloma not having achieved remission: Secondary | ICD-10-CM | POA: Diagnosis not present

## 2014-12-12 DIAGNOSIS — Z510 Encounter for antineoplastic radiation therapy: Secondary | ICD-10-CM | POA: Diagnosis not present

## 2014-12-12 DIAGNOSIS — C7951 Secondary malignant neoplasm of bone: Secondary | ICD-10-CM | POA: Diagnosis not present

## 2014-12-13 ENCOUNTER — Other Ambulatory Visit (HOSPITAL_BASED_OUTPATIENT_CLINIC_OR_DEPARTMENT_OTHER): Payer: Medicare Other

## 2014-12-13 ENCOUNTER — Ambulatory Visit (HOSPITAL_BASED_OUTPATIENT_CLINIC_OR_DEPARTMENT_OTHER): Payer: Medicare Other

## 2014-12-13 VITALS — BP 118/58 | HR 73 | Temp 98.7°F | Resp 20

## 2014-12-13 DIAGNOSIS — Z5112 Encounter for antineoplastic immunotherapy: Secondary | ICD-10-CM | POA: Diagnosis present

## 2014-12-13 DIAGNOSIS — C9000 Multiple myeloma not having achieved remission: Secondary | ICD-10-CM

## 2014-12-13 DIAGNOSIS — L309 Dermatitis, unspecified: Secondary | ICD-10-CM | POA: Diagnosis not present

## 2014-12-13 DIAGNOSIS — L271 Localized skin eruption due to drugs and medicaments taken internally: Secondary | ICD-10-CM | POA: Diagnosis not present

## 2014-12-13 LAB — COMPREHENSIVE METABOLIC PANEL (CC13)
ALT: 16 U/L (ref 0–55)
ANION GAP: 9 meq/L (ref 3–11)
AST: 18 U/L (ref 5–34)
Albumin: 3.4 g/dL — ABNORMAL LOW (ref 3.5–5.0)
Alkaline Phosphatase: 76 U/L (ref 40–150)
BILIRUBIN TOTAL: 0.65 mg/dL (ref 0.20–1.20)
BUN: 16.5 mg/dL (ref 7.0–26.0)
CO2: 29 meq/L (ref 22–29)
Calcium: 8.8 mg/dL (ref 8.4–10.4)
Chloride: 100 mEq/L (ref 98–109)
Creatinine: 0.9 mg/dL (ref 0.7–1.3)
EGFR: 77 mL/min/{1.73_m2} — ABNORMAL LOW (ref >90)
Glucose: 97 mg/dl (ref 70–140)
Potassium: 4 mEq/L (ref 3.5–5.1)
SODIUM: 139 meq/L (ref 136–145)
Total Protein: 5.8 g/dL — ABNORMAL LOW (ref 6.4–8.3)

## 2014-12-13 LAB — CBC WITH DIFFERENTIAL/PLATELET
BASO%: 0.4 % (ref 0.0–2.0)
Basophils Absolute: 0 10*3/uL (ref 0.0–0.1)
EOS ABS: 0.3 10*3/uL (ref 0.0–0.5)
EOS%: 11.2 % — AB (ref 0.0–7.0)
HEMATOCRIT: 36.4 % — AB (ref 38.4–49.9)
HGB: 12.1 g/dL — ABNORMAL LOW (ref 13.0–17.1)
LYMPH%: 7.7 % — ABNORMAL LOW (ref 14.0–49.0)
MCH: 31.5 pg (ref 27.2–33.4)
MCHC: 33.1 g/dL (ref 32.0–36.0)
MCV: 95.3 fL (ref 79.3–98.0)
MONO#: 0.4 10*3/uL (ref 0.1–0.9)
MONO%: 13.1 % (ref 0.0–14.0)
NEUT%: 67.6 % (ref 39.0–75.0)
NEUTROS ABS: 1.8 10*3/uL (ref 1.5–6.5)
Platelets: 114 10*3/uL — ABNORMAL LOW (ref 140–400)
RBC: 3.82 10*6/uL — ABNORMAL LOW (ref 4.20–5.82)
RDW: 15.7 % — AB (ref 11.0–14.6)
WBC: 2.7 10*3/uL — ABNORMAL LOW (ref 4.0–10.3)
lymph#: 0.2 10*3/uL — ABNORMAL LOW (ref 0.9–3.3)

## 2014-12-13 MED ORDER — BORTEZOMIB CHEMO SQ INJECTION 3.5 MG (2.5MG/ML)
1.3000 mg/m2 | Freq: Once | INTRAMUSCULAR | Status: AC
  Administered 2014-12-13: 2.5 mg via SUBCUTANEOUS
  Filled 2014-12-13: qty 2.5

## 2014-12-13 MED ORDER — ONDANSETRON HCL 8 MG PO TABS
8.0000 mg | ORAL_TABLET | Freq: Once | ORAL | Status: AC
  Administered 2014-12-13: 8 mg via ORAL

## 2014-12-13 MED ORDER — ONDANSETRON HCL 8 MG PO TABS
ORAL_TABLET | ORAL | Status: AC
  Filled 2014-12-13: qty 1

## 2014-12-13 NOTE — Progress Notes (Signed)
Pt requested this RN to look at his legs and abdomen.  Legs - Pt reports one dose of antibiotic left on 2nd round of antibiotics.  Retta Mac looked at his legs last week.  Patient is questioning whether he needs another round of antibiotics.  Abdomen - Right lower, approximately 1 inch below last injection site (within the red area from the shot, patient has several streaks going down his abdomen and almost to his groin.  Streaks are approximately one centimeter wide, pink and red, with slightly raised and darker edges and of various shapes.   Awilda Metro looked at both and consulted with Dr. Julien Nordmann.  Dr. Julien Nordmann to chairside.  He wants patient to see his dermatologist Dr. Elvera Lennox [facility]) for his abdomen and to have him look at his legs also.  Per Dr. Julien Nordmann, ok to treat - be sure to inject on left lower abdomen.

## 2014-12-13 NOTE — Patient Instructions (Signed)
[facility] Discharge Instructions for Patients Receiving Chemotherapy  Today you received the following chemotherapy agents: velcade  To help prevent nausea and vomiting after your treatment, we encourage you to take your nausea medication.  Take it as often as prescribed.     If you develop nausea and vomiting that is not controlled by your nausea medication, call the clinic. If it is after clinic hours your family physician or the after hours number for the clinic or go to the Emergency Department.   BELOW ARE SYMPTOMS THAT SHOULD BE REPORTED IMMEDIATELY:  *FEVER GREATER THAN 100.5 F  *CHILLS WITH OR WITHOUT FEVER  NAUSEA AND VOMITING THAT IS NOT CONTROLLED WITH YOUR NAUSEA MEDICATION  *UNUSUAL SHORTNESS OF BREATH  *UNUSUAL BRUISING OR BLEEDING  TENDERNESS IN MOUTH AND THROAT WITH OR WITHOUT PRESENCE OF ULCERS  *URINARY PROBLEMS  *BOWEL PROBLEMS  UNUSUAL RASH Items with * indicate a potential emergency and should be followed up as soon as possible.  Feel free to call the clinic you have any questions or concerns. The clinic phone number is [phone].   I have been informed and understand all the instructions given to me. I know to contact the clinic, my physician, or go to the Emergency Department if any problems should occur. I do not have any questions at this time, but understand that I may call the clinic during office hours   should I have any questions or need assistance in obtaining follow up care.    __________________________________________  _____________  __________ Signature of Patient or Authorized Representative            Date                   Time    __________________________________________ Nurse's Signature    

## 2014-12-14 ENCOUNTER — Ambulatory Visit: Payer: Medicare Other

## 2014-12-14 VITALS — BP 126/58 | HR 88

## 2014-12-14 DIAGNOSIS — R29898 Other symptoms and signs involving the musculoskeletal system: Secondary | ICD-10-CM

## 2014-12-14 DIAGNOSIS — Z7409 Other reduced mobility: Secondary | ICD-10-CM | POA: Diagnosis not present

## 2014-12-14 DIAGNOSIS — R279 Unspecified lack of coordination: Secondary | ICD-10-CM

## 2014-12-14 DIAGNOSIS — R262 Difficulty in walking, not elsewhere classified: Secondary | ICD-10-CM

## 2014-12-14 DIAGNOSIS — D492 Neoplasm of unspecified behavior of bone, soft tissue, and skin: Secondary | ICD-10-CM | POA: Diagnosis not present

## 2014-12-14 DIAGNOSIS — C9000 Multiple myeloma not having achieved remission: Secondary | ICD-10-CM | POA: Diagnosis not present

## 2014-12-14 DIAGNOSIS — R6889 Other general symptoms and signs: Secondary | ICD-10-CM | POA: Diagnosis not present

## 2014-12-14 DIAGNOSIS — C7951 Secondary malignant neoplasm of bone: Secondary | ICD-10-CM | POA: Diagnosis not present

## 2014-12-14 DIAGNOSIS — M2560 Stiffness of unspecified joint, not elsewhere classified: Secondary | ICD-10-CM | POA: Diagnosis not present

## 2014-12-14 NOTE — Therapy (Signed)
[facility] [address] [place], [place], [address] Phone: [phone]   Fax:  [phone]  Physical Therapy Treatment  Patient Details  Name: Jeremiah Owens MRN: [id number] Date of Birth: [birth year] Referring Provider:  Wenda Low, MD  Encounter Date: 12/14/2014      PT End of Session - 12/14/14 0942    Visit Number 27   Number of Visits 33   Date for PT Re-Evaluation 12/29/14   Authorization Type Medicare [employer], [employer] secondary G codes required   PT Start Time 0849   PT Stop Time 0930   PT Time Calculation (min) 41 min      Past Medical History  Diagnosis Date  . Compression fracture   . Melanoma   . Bone cancer     t_spine T-5  . Prostate cancer 2002  . Skin cancer 1994    melanoma  right neck   . Multiple myeloma   . Allergy   . Anxiety   . Thyroid disease   . S/P radiation therapy 08/21/14-09/04/14    T4-6 25Gy/64f    Past Surgical History  Procedure Laterality Date  . Hernia repair    . Posterior cervical fusion/foraminotomy N/A 06/17/2014    Procedure: Thoracic five laminectomy for epidural tumor resection Thoracic 4-7 posterior lateral arthrodesis, segmental pedicle screw fixation.;  Surgeon: KAshok Pall MD;  [facility];  Service: Neurosurgery;  Laterality: N/A;    Filed Vitals:   12/14/14 0901  BP: 126/58  Pulse: 88  SpO2: 98%    Visit Diagnosis:  Lack of coordination  Difficulty walking  Weakness of left leg      Subjective Assessment - 12/14/14 0859    Subjective Pt reports that he spent a lot of time in MD offices yesterday trying to determine what the red, raised markings on his lower abdomen/groin area are. He did not receive any answers yesterday but a biopsy was taken by the dermatologist and he is awaiting results.   Currently in Pain? No/denies     Self care (15 min)- pt reported his day at various dr. OPaulene Floortrying to determine the cause of  the raised skin marckings on the R groin/lower abdomen. Therapist re-iterated to pt the importance of using a full length mirror to view his whole body front/back/lateral on a daily basis to note any concerning changes in his skin "redness, swelling, drainage" and to report any changes to his physician as a means to combat the high risk of infection which is present when immune suppressed due to chemotherapy.   Gait training (10 minutes) with cane x345' with CGA and increased scissoring noted but no loss of balance. After resting, completed a second trial with CGA progressing to MIN A with cane and decreased speed but decreased scissoring until the 3rd lap in which scissoring increased.  Neuro re-ed: Single limb stance activities: foot taps on 8" step stool forward and lateral with visual and tactile cues to facilitate glut med activation and maintain level pelvis. Rest provided as needed. Performed on right and left leg.                              PT Short Term Goals - 11/27/14 0939    PT SHORT TERM GOAL #1   Title Pt will demonstrate independence with HEP Target: 10/06/14   Status Achieved   PT SHORT TERM GOAL #2   Title Pt will  increase BERG score to 36/56 for improving balance.  Target: 10/06/14   Status Achieved   PT SHORT TERM GOAL #3   Title Pt will Decrease TUG time to 14.69 seconds for improving efficiency with functional mobility using a single point cane. Target 10/06/14   Status Not Met  18.75 on 10/12/14   PT SHORT TERM GOAL #4   Title Pt will ambulate independently on indoor level surfaces with cane.  Target: 10/06/14   Status Not Met  requires CGA and demonstrates inefficient gait pattern with cane   PT SHORT TERM GOAL #5   Title Pt will perform sit to stand without UE support, independently.   Target: 10/06/14   Status Not Met   PT SHORT TERM GOAL #6   Title Pt will increase Berg Balance Test score to 46/56 for decreased fall risk. Target 12/01/14   Status  Not Met  Progressing; scored 45/56 on 11/27/14   PT SHORT TERM GOAL #7   Title Pt will decrease TUG time with LRAD to < 13.5 ft/sec for decreased fall risk. Target 12/01/14   Status Not Met   PT SHORT TERM GOAL #8   Title Pt will increase gait speed to 2.32 ft/sec with LRAD. Target 12/01/14   Status Partially Met  Gait speed increased to 2.63 with RW when walking "fast" but is 1.91 at "normal pace"   PT SHORT TERM GOAL #9   TITLE Pt will ambulate on indoor level surfaces with single point cane with CGA x200' Target 12/01/14   Baseline Revised: to use single point cane rather than no assistive device due to nature of disease causing fragile bones and pt is unlikely to progress to ambulate without device.    Status Achieved           PT Long Term Goals - 11/27/14 1220    PT LONG TERM GOAL #1   Title Pt will increase Berg score to 49/56 for improved balance.  Target 12/29/14   Baseline extended target date to end of renewal   Status On-going  Scored 43/56 on 10/31/14   PT LONG TERM GOAL #2   Title Pt will ambulate modified independently on indoor level surfaces with cane. Target 12/29/14   Baseline revised to utilize cane rather than no assistive device   Status Revised   PT LONG TERM GOAL #3   Title Pt will ambulate with suprervision on outdoor unlevel grass and concrete wih single point cane. Target 12/29/14   Baseline Revised to allow for single point cane and Extended target to end of renewal   Status On-going   PT LONG TERM GOAL #4   Title Pt will decrease TUG time to <13.5 seconds with  cane for decreased fall risk and increased independence. TTarget 5/27/1h   Baseline revised to use cane   Status Revised  TUG with RW 14.83 seconds on 10/31/14   PT LONG TERM GOAL #5   Title Pt will increase gait speed to >2.63 ft/sec with LRAD for community ambulator status. Target 12/29/14   Baseline Revised to allow for assistive device and extended to end of renewal plan of care    PT LONG TERM  GOAL #6   Title Increase FOTO ABC to 30% for improved balance conficdence. Target 12/29/14   Baseline extended to end of renewal plan of care   Status On-going               Plan - 12/14/14 0945    Clinical Impression Statement  Pt demonstrated increased scissoring of gait today, but increased foot clearance. He required CGA to MIN A consistently. Pt's independence is closely related to the on vs off weeks of his chemotherapy. Will likely d/c after the currently scheduled visits with plan to start therapy again if he finishes the chemotherapy program. Pt currently has strong desire to continue working and building strength.   PT Next Visit Plan Single limb stance balance training, continued hip strengthening, gait training with cane        Problem List Patient Active Problem List   Diagnosis Date Noted  . Cellulitis 12/06/2014  . Peripheral edema 12/06/2014  . Gait disorder 09/05/2014  . Multiple myeloma   . Paraplegia 06/21/2014  . Postoperative anemia due to acute blood loss 06/21/2014  . 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 Spine metastasis 06/17/2014  . Thoracic spine tumor 06/17/2014  . Metastatic bone cancer 06/15/2014    Delrae Sawyers, PT,DPT,NCS 12/14/2014 10:11 AM Phone [phone] FAX [phone]         [facility] [address] [place], [place], [address] Phone: [phone]   Fax:  [phone]

## 2014-12-18 ENCOUNTER — Ambulatory Visit: Payer: Medicare Other

## 2014-12-18 ENCOUNTER — Other Ambulatory Visit: Payer: Self-pay | Admitting: Internal Medicine

## 2014-12-20 ENCOUNTER — Other Ambulatory Visit (HOSPITAL_BASED_OUTPATIENT_CLINIC_OR_DEPARTMENT_OTHER): Payer: Medicare Other

## 2014-12-20 ENCOUNTER — Ambulatory Visit (HOSPITAL_BASED_OUTPATIENT_CLINIC_OR_DEPARTMENT_OTHER): Payer: Medicare Other | Admitting: Nurse Practitioner

## 2014-12-20 ENCOUNTER — Encounter: Payer: Self-pay | Admitting: Nurse Practitioner

## 2014-12-20 ENCOUNTER — Ambulatory Visit: Payer: Medicare Other

## 2014-12-20 ENCOUNTER — Telehealth: Payer: Self-pay | Admitting: *Deleted

## 2014-12-20 ENCOUNTER — Other Ambulatory Visit: Payer: Self-pay | Admitting: *Deleted

## 2014-12-20 ENCOUNTER — Telehealth: Payer: Self-pay | Admitting: Nurse Practitioner

## 2014-12-20 ENCOUNTER — Ambulatory Visit (HOSPITAL_BASED_OUTPATIENT_CLINIC_OR_DEPARTMENT_OTHER): Payer: Medicare Other

## 2014-12-20 VITALS — BP 122/53 | HR 75 | Temp 98.0°F | Resp 18 | Ht 68.0 in | Wt 166.0 lb

## 2014-12-20 DIAGNOSIS — R21 Rash and other nonspecific skin eruption: Secondary | ICD-10-CM

## 2014-12-20 DIAGNOSIS — Z5112 Encounter for antineoplastic immunotherapy: Secondary | ICD-10-CM

## 2014-12-20 DIAGNOSIS — K5900 Constipation, unspecified: Secondary | ICD-10-CM | POA: Insufficient documentation

## 2014-12-20 DIAGNOSIS — C9000 Multiple myeloma not having achieved remission: Secondary | ICD-10-CM | POA: Diagnosis not present

## 2014-12-20 DIAGNOSIS — R609 Edema, unspecified: Secondary | ICD-10-CM

## 2014-12-20 DIAGNOSIS — C419 Malignant neoplasm of bone and articular cartilage, unspecified: Secondary | ICD-10-CM

## 2014-12-20 LAB — CBC WITH DIFFERENTIAL/PLATELET
BASO%: 0.5 % (ref 0.0–2.0)
Basophils Absolute: 0 10*3/uL (ref 0.0–0.1)
EOS%: 8.8 % — ABNORMAL HIGH (ref 0.0–7.0)
Eosinophils Absolute: 0.4 10*3/uL (ref 0.0–0.5)
HEMATOCRIT: 38.5 % (ref 38.4–49.9)
HGB: 12.5 g/dL — ABNORMAL LOW (ref 13.0–17.1)
LYMPH%: 8.4 % — AB (ref 14.0–49.0)
MCH: 31.1 pg (ref 27.2–33.4)
MCHC: 32.4 g/dL (ref 32.0–36.0)
MCV: 96.1 fL (ref 79.3–98.0)
MONO#: 0.7 10*3/uL (ref 0.1–0.9)
MONO%: 16.7 % — ABNORMAL HIGH (ref 0.0–14.0)
NEUT#: 2.7 10*3/uL (ref 1.5–6.5)
NEUT%: 65.6 % (ref 39.0–75.0)
PLATELETS: 107 10*3/uL — AB (ref 140–400)
RBC: 4.01 10*6/uL — ABNORMAL LOW (ref 4.20–5.82)
RDW: 15.3 % — ABNORMAL HIGH (ref 11.0–14.6)
WBC: 4.2 10*3/uL (ref 4.0–10.3)
lymph#: 0.4 10*3/uL — ABNORMAL LOW (ref 0.9–3.3)

## 2014-12-20 LAB — BASIC METABOLIC PANEL (CC13)
Anion Gap: 10 mEq/L (ref 3–11)
BUN: 17.4 mg/dL (ref 7.0–26.0)
CO2: 29 mEq/L (ref 22–29)
Calcium: 8.5 mg/dL (ref 8.4–10.4)
Chloride: 100 mEq/L (ref 98–109)
Creatinine: 1 mg/dL (ref 0.7–1.3)
EGFR: 70 mL/min/{1.73_m2} — ABNORMAL LOW (ref >90)
Glucose: 87 mg/dl (ref 70–140)
POTASSIUM: 4.1 meq/L (ref 3.5–5.1)
SODIUM: 138 meq/L (ref 136–145)

## 2014-12-20 MED ORDER — BORTEZOMIB CHEMO SQ INJECTION 3.5 MG (2.5MG/ML)
1.3000 mg/m2 | Freq: Once | INTRAMUSCULAR | Status: AC
  Administered 2014-12-20: 2.5 mg via SUBCUTANEOUS
  Filled 2014-12-20: qty 2.5

## 2014-12-20 MED ORDER — ONDANSETRON HCL 8 MG PO TABS
8.0000 mg | ORAL_TABLET | Freq: Once | ORAL | Status: AC
  Administered 2014-12-20: 8 mg via ORAL

## 2014-12-20 MED ORDER — ONDANSETRON HCL 8 MG PO TABS
ORAL_TABLET | ORAL | Status: AC
  Filled 2014-12-20: qty 1

## 2014-12-20 NOTE — Telephone Encounter (Signed)
Per staff message and POF I have scheduled appts. Advised scheduler of appts. JMW  

## 2014-12-20 NOTE — Progress Notes (Signed)
[facility] [phone]   Fax:([phone]) [facility] [address] [employer] [address] [place] [address]  DIAGNOSIS: Multiple myeloma diagnosed in November 2015  PRIOR THERAPY:  1) Status post Thoracic five laminectomy for epidural tumor resection, Thoracic 4-7 posterior lateral arthrodesis,  segmental pedicle screw fixation T4-T7 Globus instrumentation under the care of Dr. Christella Noa on 06/18/2014. 2) Systemic chemotherapy with Velcade 1.3 MG/M2 subcutaneously weekly in addition to Decadron 40 mg by mouth on weekly basis. Status post 11 cycles.  CURRENT THERAPY:  1) Systemic chemotherapy with Velcade 1.3 MG/M2 subcutaneously weekly, Decadron 40 mg by mouth weekly in addition to Revlimid 25 MG by mouth daily for 21 days every 4 weeks, status post 2 cycles. 2) Zometa 4 mg IV every month. First dose 11/01/2014.  INTERVAL HISTORY: Jeremiah Owens 81 y.o. male returns to the clinic today for follow-up visit accompanied by his wife. The patient is currently on treatment with weekly subcutaneous Velcade, Revlimid and Decadron status post 2 cycles and tolerating the treatment fairly well. Since his last visit, he has seen a dermatologist for a rash to his right lower abdomen/groin. He was prescribed clobetasol cream and this cleared the rash quickly. Now he has a similar rash to his left lower abdomen/groin, only no vesicles are present this time. It does not hurt or itch. The swelling and erythema to his bilateral lower legs has improved after a second course of keflex. He had 10 radiation treatments to his right shoulder and now that skin is red and pruritic. He has been applying radioplex gel to this area. He denies fevers, chills, nausea, or vomiting. He was constipated for several days and was possibly impacted, but finally passed "the plug." his appetite is poor and he uses Boost shakes to supplement his det. He denied having any  significant chest pain, shortness of breath, cough or hemoptysis.  MEDICAL HISTORY: Past Medical History  Diagnosis Date  . Compression fracture   . Melanoma   . Bone cancer     t_spine T-5  . Prostate cancer 2002  . Skin cancer 1994    melanoma  right neck   . Multiple myeloma   . Allergy   . Anxiety   . Thyroid disease   . S/P radiation therapy 08/21/14-09/04/14    T4-6 25Gy/23f    ALLERGIES:  is allergic to iodine.  MEDICATIONS:  Current Outpatient Prescriptions  Medication Sig Dispense Refill  . acyclovir (ZOVIRAX) 400 MG tablet TAKE 1 TABLET BY MOUTH TWICE A DAY 60 tablet 2  . Calcium Carbonate-Vitamin D (CALTRATE 600+D PO) Take 600 mg by mouth 2 (two) times daily.    . cholecalciferol (VITAMIN D) 1000 UNITS tablet Take 1,000 Units by mouth at bedtime.     .Marland Kitchendexamethasone (DECADRON) 4 MG tablet 10 TAB EVERY WEEK, START WITH CHEMO 40 tablet 4  . furosemide (LASIX) 20 MG tablet Take 1 tablet (20 mg total) by mouth daily. As needed for swelling (Patient taking differently: Take 20 mg by mouth 2 (two) times daily. As needed for swelling) 10 tablet 0  . hyaluronate sodium (RADIAPLEXRX) GEL Apply 1 application topically daily. Apply to treated area on skin after rad txs daily and prn    . lenalidomide (REVLIMID) 25 MG capsule Take 1 capsule (25 mg total) by mouth daily. Take one capsule ( 25 mg) by mouth for 21 days every 4 weeks-11/29/14 Authorization number= [id number] adult male 220  capsule 0  . levothyroxine (SYNTHROID, LEVOTHROID) 25 MCG tablet Take 1 tablet (25 mcg total) by mouth daily. 30 tablet 4  . methocarbamol (ROBAXIN) 500 MG tablet TAKE 1 TABLET (500 MG TOTAL) BY MOUTH EVERY 6 (SIX) HOURS AS NEEDED FOR MUSCLE SPASMS. 60 tablet 0  . Multiple Vitamins-Minerals (CENTRUM SILVER PO) Take 1 tablet by mouth daily.    . NON FORMULARY Prunes prn    . omeprazole (PRILOSEC) 40 MG capsule Take 40 mg by mouth daily. In the afternoon  11  . potassium chloride (K-DUR,KLOR-CON) 10 MEQ  tablet Take 10 mEq by mouth daily.    . prochlorperazine (COMPAZINE) 10 MG tablet TAKE 1 TABLET BY MOUTH EVERY 6 HOURS AS NEEDED FOR NAUSEA AND VOMITING 30 tablet 0  . tamsulosin (FLOMAX) 0.4 MG CAPS capsule Take 1 capsule (0.4 mg total) by mouth at bedtime. 30 capsule 3  . warfarin (COUMADIN) 2 MG tablet Take 1 tablet (2 mg total) by mouth daily. 30 tablet 3  . HYDROcodone-acetaminophen (NORCO) 5-325 MG per tablet Take 1 tablet by mouth every 6 (six) hours as needed for moderate pain. (Patient not taking: Reported on 12/20/2014) 60 tablet 0  . HYDROCORTISONE, TOPICAL, 2.5 % SOLN Apply 1 application topically daily as needed.     . Polyvinyl Alcohol-Povidone (REFRESH OP) Apply 1 drop to eye daily as needed (dry/irritated eyes).    . ranitidine (ZANTAC) 150 MG tablet Take 150 mg by mouth at bedtime.   11   No current facility-administered medications for this visit.    SURGICAL HISTORY:  Past Surgical History  Procedure Laterality Date  . Hernia repair    . Posterior cervical fusion/foraminotomy N/A 06/17/2014    Procedure: Thoracic five laminectomy for epidural tumor resection Thoracic 4-7 posterior lateral arthrodesis, segmental pedicle screw fixation.;  Surgeon: Ashok Pall, MD;  Location: [facility];  Service: Neurosurgery;  Laterality: N/A;    REVIEW OF SYSTEMS:  Constitutional: positive for fatigue Eyes: negative Ears, nose, mouth, throat, and face: negative Respiratory: negative Cardiovascular: negative Gastrointestinal: positive for constipation Genitourinary:negative Integument/breast: positive for rash Hematologic/lymphatic: negative Musculoskeletal:positive for bone pain and muscle weakness Neurological: negative Behavioral/Psych: negative Endocrine: negative Allergic/Immunologic: negative   PHYSICAL EXAMINATION: General appearance: alert, cooperative, fatigued and no distress Head: Normocephalic, without obvious abnormality, atraumatic Neck: no adenopathy, no JVD,  supple, symmetrical, trachea midline and thyroid not enlarged, symmetric, no tenderness/mass/nodules Lymph nodes: Cervical, supraclavicular, and axillary nodes normal. Resp: clear to auscultation bilaterally Back: symmetric, no curvature. ROM normal. No CVA tenderness. Cardio: regular rate and rhythm, S1, S2 normal, no murmur, click, rub or gallop GI: soft, non-tender; bowel sounds normal; no masses,  no organomegaly Extremities: edema 2+ edema bilaterally with erythema on the legs Neurologic: Alert and oriented X 3, normal strength and tone. Normal symmetric reflexes. Normal coordination and gait Motor: grossly normal Weakness in the left foot  Diffuse erythematous macular rash to left lower abdomen. No vesicles noted. Erythematous papules to right shoulder from radiation  ECOG PERFORMANCE STATUS: 1 - Symptomatic but completely ambulatory  Blood pressure 122/53, pulse 75, temperature 98 F (36.7 C), temperature source Oral, resp. rate 18, height _0  (1.727 m), weight 166 lb (75.297 kg), SpO2 100 %.  LABORATORY DATA: Lab Results  Component Value Date   WBC 4.2 12/20/2014   HGB 12.5* 12/20/2014   HCT 38.5 12/20/2014   MCV 96.1 12/20/2014   PLT 107* 12/20/2014      Chemistry      Component Value Date/Time  NA 138 12/20/2014 0952   NA 133* 07/04/2014 0430   K 4.1 12/20/2014 0952   K 4.1 07/04/2014 0430   CL 97 07/04/2014 0430   CO2 29 12/20/2014 0952   CO2 27 07/04/2014 0430   BUN 17.4 12/20/2014 0952   BUN 18 07/04/2014 0430   CREATININE 1.0 12/20/2014 0952   CREATININE 0.90 07/04/2014 0430      Component Value Date/Time   CALCIUM 8.5 12/20/2014 0952   CALCIUM 8.9 07/04/2014 0430   ALKPHOS 76 12/13/2014 0946   ALKPHOS 47 06/21/2014 0406   AST 18 12/13/2014 0946   AST 31 06/21/2014 0406   ALT 16 12/13/2014 0946   ALT 29 06/21/2014 0406   BILITOT 0.65 12/13/2014 0946   BILITOT <0.2* 06/21/2014 0406       RADIOGRAPHIC STUDIES: No results found.  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 He completed treatment with subcutaneous Velcade and Decadron status post 11 weekly doses. This was discontinued secondary to disease progression. The patient was started on treatment with weekly subcutaneous Velcade, Revlimid for 21 days every 4 weeks in addition to Decadron 40 mg weekly status post 2 cycles and he is tolerating it fairly well. He is currently undergoing cycle #3. For his left lower quadrant rash, I asked that he treat it exactly the same way he treated the right side previously, using clobetasol prescribed by his dermatologist.  He will continue to elevate his legs and wear compression stocking for his bilateral lower extremity edema. I recommended he use his sennakot BID and miralax daily so long as his stool aren't runny to prevent constipation. Also he will work on increasing his water intake. The labs were reviewed in detail and were entirely stable. He will proceed with velcade as planned today and weekly on Wednesdays. He will be due for zometa next week.   [facility] understands and agrees with this plan. He knows the goal of treatment in his case is cure. He has been encouraged to call with any issues that might arise before his next visit here.  Laurie Panda, NP 12/20/2014

## 2014-12-20 NOTE — Patient Instructions (Signed)
[facility] Discharge Instructions for Patients Receiving Chemotherapy  Today you received the following chemotherapy agents Velcade. To help prevent nausea and vomiting after your treatment, we encourage you to take your nausea medication as directed.  If you develop nausea and vomiting that is not controlled by your nausea medication, call the clinic.   BELOW ARE SYMPTOMS THAT SHOULD BE REPORTED IMMEDIATELY:  *FEVER GREATER THAN 100.5 F  *CHILLS WITH OR WITHOUT FEVER  NAUSEA AND VOMITING THAT IS NOT CONTROLLED WITH YOUR NAUSEA MEDICATION  *UNUSUAL SHORTNESS OF BREATH  *UNUSUAL BRUISING OR BLEEDING  TENDERNESS IN MOUTH AND THROAT WITH OR WITHOUT PRESENCE OF ULCERS  *URINARY PROBLEMS  *BOWEL PROBLEMS  UNUSUAL RASH Items with * indicate a potential emergency and should be followed up as soon as possible.  Feel free to call the clinic you have any questions or concerns. The clinic phone number is [phone].  Please show the CHEMO ALERT CARD at check-in to the Emergency Department and triage nurse.    

## 2014-12-20 NOTE — Telephone Encounter (Signed)
Gave avs & calendar for May/June. Sent message to schedule treatment. °

## 2014-12-21 ENCOUNTER — Ambulatory Visit: Payer: Medicare Other

## 2014-12-21 DIAGNOSIS — R6889 Other general symptoms and signs: Secondary | ICD-10-CM | POA: Diagnosis not present

## 2014-12-21 DIAGNOSIS — Z7409 Other reduced mobility: Secondary | ICD-10-CM | POA: Diagnosis not present

## 2014-12-21 DIAGNOSIS — M2560 Stiffness of unspecified joint, not elsewhere classified: Secondary | ICD-10-CM | POA: Diagnosis not present

## 2014-12-21 DIAGNOSIS — R262 Difficulty in walking, not elsewhere classified: Secondary | ICD-10-CM

## 2014-12-21 DIAGNOSIS — R279 Unspecified lack of coordination: Secondary | ICD-10-CM

## 2014-12-21 DIAGNOSIS — C9000 Multiple myeloma not having achieved remission: Secondary | ICD-10-CM | POA: Diagnosis not present

## 2014-12-21 DIAGNOSIS — D492 Neoplasm of unspecified behavior of bone, soft tissue, and skin: Secondary | ICD-10-CM | POA: Diagnosis not present

## 2014-12-21 DIAGNOSIS — C7951 Secondary malignant neoplasm of bone: Secondary | ICD-10-CM | POA: Diagnosis not present

## 2014-12-21 NOTE — Therapy (Signed)
[facility] [address] [place], [place], [address] Phone: [phone]   Fax:  [phone]  Physical Therapy Treatment  Patient Details  Name: Jeremiah Owens MRN: [id number] Date of Birth: May 28, 1930 Referring Provider:  Wenda Low, MD  Encounter Date: 12/21/2014      PT End of Session - 12/21/14 0850    Visit Number 28   Number of Visits 33   Date for PT Re-Evaluation 12/29/14   Authorization Type Medicare [employer], [employer] secondary G codes required   PT Start Time 0807   PT Stop Time 0846   PT Time Calculation (min) 39 min      Past Medical History  Diagnosis Date  . Compression fracture   . Melanoma   . Bone cancer     t_spine T-5  . Prostate cancer 2002  . Skin cancer 1994    melanoma  right neck   . Multiple myeloma   . Allergy   . Anxiety   . Thyroid disease   . S/P radiation therapy 08/21/14-09/04/14    T4-6 25Gy/73f    Past Surgical History  Procedure Laterality Date  . Hernia repair    . Posterior cervical fusion/foraminotomy N/A 06/17/2014    Procedure: Thoracic five laminectomy for epidural tumor resection Thoracic 4-7 posterior lateral arthrodesis, segmental pedicle screw fixation.;  Surgeon: KAshok Pall MD;  [facility];  Service: Neurosurgery;  Laterality: N/A;    There were no vitals filed for this visit.  Visit Diagnosis:  Lack of coordination  Difficulty walking          [facility] - 12/21/14 0001    Berg Balance Test   Sit to Stand Able to stand without using hands and stabilize independently   Standing Unsupported Able to stand safely 2 minutes   Sitting with Back Unsupported but Feet Supported on Floor or Stool Able to sit 2 minutes under supervision   Stand to Sit Sits safely with minimal use of hands   Transfers Able to transfer safely, minor use of hands   Standing Unsupported with Eyes Closed Able to stand 10 seconds safely   Standing  Ubsupported with Feet Together Able to place feet together independently and stand for 1 minute with supervision   From Standing, Reach Forward with Outstretched Arm Can reach forward >5 cm safely (2")   From Standing Position, Pick up Object from Floor Able to pick up shoe, needs supervision   From Standing Position, Turn to Look Behind Over each Shoulder Turn sideways only but maintains balance   Turn 360 Degrees Needs close supervision or verbal cueing   Standing Unsupported, Alternately Place Feet on Step/Stool Able to complete >2 steps/needs minimal assist   Standing Unsupported, One Foot in FONEOKbalance while stepping or standing   Standing on One Leg Unable to try or needs assist to prevent fall   Total Score 35      Neuro re-ed: berg balance test see above  Self care: thorough discussion with pt regarding plateau in progress with balance, the fact that downgrading to a cane for ambulation is no longer a safe goal due to balance not consistently improving, and increased risk of fracture due to multiple myeloma diagnosis, and recommendation to d/c from PT next week rather than adding more visits. [place] he understands he will need to use a rollator or RW for this. Therapist made  recommendation that in the future, a PT referral can be made if he notices a rapid decline in balance or mobility, and PT would only be recommended for 4 (maximum of 8) weeks as a means to jumpstart his balance again. Therapist explained that achieving ability to ambulate with a cane or without an assistive device is not a safe or appropriate goal for any point in the future due to the progressive nature of his diagnosis. Pt verbalized understanding and is agreeable with this plan.                       PT Education - 12/21/14 0849    Education provided Yes   Education Details regarding recommendation to wrap up therapy next week     Person(s) Educated Patient   Methods Explanation   Comprehension Verbalized understanding          PT Short Term Goals - 11/27/14 0939    PT SHORT TERM GOAL #1   Title Pt will demonstrate independence with HEP Target: 10/06/14   Status Achieved   PT SHORT TERM GOAL #2   Title Pt will increase BERG score to 36/56 for improving balance.  Target: 10/06/14   Status Achieved   PT SHORT TERM GOAL #3   Title Pt will Decrease TUG time to 14.69 seconds for improving efficiency with functional mobility using a single point cane. Target 10/06/14   Status Not Met  18.75 on 10/12/14   PT SHORT TERM GOAL #4   Title Pt will ambulate independently on indoor level surfaces with cane.  Target: 10/06/14   Status Not Met  requires CGA and demonstrates inefficient gait pattern with cane   PT SHORT TERM GOAL #5   Title Pt will perform sit to stand without UE support, independently.   Target: 10/06/14   Status Not Met   PT SHORT TERM GOAL #6   Title Pt will increase Berg Balance Test score to 46/56 for decreased fall risk. Target 12/01/14   Status Not Met  Progressing; scored 45/56 on 11/27/14   PT SHORT TERM GOAL #7   Title Pt will decrease TUG time with LRAD to < 13.5 ft/sec for decreased fall risk. Target 12/01/14   Status Not Met   PT SHORT TERM GOAL #8   Title Pt will increase gait speed to 2.32 ft/sec with LRAD. Target 12/01/14   Status Partially Met  Gait speed increased to 2.63 with RW when walking "fast" but is 1.91 at "normal pace"   PT SHORT TERM GOAL #9   TITLE Pt will ambulate on indoor level surfaces with single point cane with CGA x200' Target 12/01/14   Baseline Revised: to use single point cane rather than no assistive device due to nature of disease causing fragile bones and pt is unlikely to progress to ambulate without device.    Status Achieved           PT Long Term Goals - 12/21/14 0857    PT LONG TERM GOAL #1   Title Pt will increase Berg score to 49/56 for improved balance.  Target  12/29/14   Baseline extended target date to end of renewal   Status Not Met  Scored 43/56 on 10/31/14   PT LONG TERM GOAL #2   Title Pt will ambulate modified independently on indoor level surfaces with cane. Target 12/29/14   Baseline revised to utilize cane rather than no assistive device   Status Revised   PT LONG  TERM GOAL #3   Title Pt will ambulate with suprervision on outdoor unlevel grass and concrete wih single point cane. Target 12/29/14   Baseline Revised to allow for single point cane and Extended target to end of renewal   Status On-going   PT LONG TERM GOAL #4   Title Pt will decrease TUG time to <13.5 seconds with  cane for decreased fall risk and increased independence. TTarget 5/27/1h   Baseline revised to use cane   Status Revised  TUG with RW 14.83 seconds on 10/31/14   PT LONG TERM GOAL #5   Title Pt will increase gait speed to >2.63 ft/sec with LRAD for community ambulator status. Target 12/29/14   Baseline Revised to allow for assistive device and extended to end of renewal plan of care    PT LONG TERM GOAL #6   Title Increase FOTO ABC to 30% for improved balance conficdence. Target 12/29/14   Baseline extended to end of renewal plan of care   Status On-going               Plan - 12/21/14 0850    Clinical Impression Statement Majority of session spent today discussing pt's plateau in progress, risk of fracture if he uses a cane, and recommendation to d/c from PT next week.    PT Next Visit Plan Check goals and prepare for d/c        Problem List Patient Active Problem List   Diagnosis Date Noted  . Constipation 12/20/2014  . Rash 12/20/2014  . Cellulitis 12/06/2014  . Peripheral edema 12/06/2014  . Gait disorder 09/05/2014  . Multiple myeloma   . Paraplegia 06/21/2014  . Postoperative anemia due to acute blood loss 06/21/2014  . 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 Spine metastasis 06/17/2014  . Thoracic spine tumor 06/17/2014  . Metastatic  bone cancer 06/15/2014   Delrae Sawyers, PT,DPT,NCS 12/21/2014 11:45 AM Phone [phone] FAX [phone]         [facility] [address] [place], [place], [address] Phone: [phone]   Fax:  [phone]

## 2014-12-25 ENCOUNTER — Ambulatory Visit: Payer: Medicare Other

## 2014-12-25 DIAGNOSIS — M2560 Stiffness of unspecified joint, not elsewhere classified: Secondary | ICD-10-CM | POA: Diagnosis not present

## 2014-12-25 DIAGNOSIS — D492 Neoplasm of unspecified behavior of bone, soft tissue, and skin: Secondary | ICD-10-CM | POA: Diagnosis not present

## 2014-12-25 DIAGNOSIS — R6889 Other general symptoms and signs: Secondary | ICD-10-CM | POA: Diagnosis not present

## 2014-12-25 DIAGNOSIS — M25512 Pain in left shoulder: Secondary | ICD-10-CM | POA: Diagnosis not present

## 2014-12-25 DIAGNOSIS — C9000 Multiple myeloma not having achieved remission: Secondary | ICD-10-CM | POA: Diagnosis not present

## 2014-12-25 DIAGNOSIS — Z7409 Other reduced mobility: Secondary | ICD-10-CM | POA: Diagnosis not present

## 2014-12-25 DIAGNOSIS — C7951 Secondary malignant neoplasm of bone: Secondary | ICD-10-CM | POA: Diagnosis not present

## 2014-12-25 DIAGNOSIS — R279 Unspecified lack of coordination: Secondary | ICD-10-CM

## 2014-12-25 DIAGNOSIS — R262 Difficulty in walking, not elsewhere classified: Secondary | ICD-10-CM

## 2014-12-25 DIAGNOSIS — M25511 Pain in right shoulder: Secondary | ICD-10-CM | POA: Diagnosis not present

## 2014-12-25 NOTE — Therapy (Signed)
[facility] [address] [place], [place], [address] Phone: [phone]   Fax:  [phone]  Physical Therapy Treatment  Patient Details  Name: Jeremiah Owens MRN: [id number] Date of Birth: [birth year] Referring Provider:  Wenda Low, MD  Encounter Date: 12/25/2014      PT End of Session - 12/25/14 1658    Visit Number 29   Number of Visits 33   Date for PT Re-Evaluation 12/29/14   Authorization Type Medicare [employer], [employer] secondary G codes required   PT Start Time 5681   PT Stop Time 1530   PT Time Calculation (min) 45 min      Past Medical History  Diagnosis Date  . Compression fracture   . Melanoma   . Bone cancer     t_spine T-5  . Prostate cancer 2002  . Skin cancer 1994    melanoma  right neck   . Multiple myeloma   . Allergy   . Anxiety   . Thyroid disease   . S/P radiation therapy 08/21/14-09/04/14    T4-6 25Gy/79f    Past Surgical History  Procedure Laterality Date  . Hernia repair    . Posterior cervical fusion/foraminotomy N/A 06/17/2014    Procedure: Thoracic five laminectomy for epidural tumor resection Thoracic 4-7 posterior lateral arthrodesis, segmental pedicle screw fixation.;  Surgeon: KAshok Pall MD;  [facility];  Service: Neurosurgery;  Laterality: N/A;    There were no vitals filed for this visit.  Visit Diagnosis:  Lack of coordination  Difficulty walking      Subjective Assessment - 12/25/14 1455    Subjective Pt reports that on Saturday and Sunday he walked for 25-30 minutes with his rollator.   Currently in Pain? No/denies       Checked all goals today except FOTO goal.   Gait training x500' on outdoor,even and  uneven concrete and grass with rollator and supervision due to mild unsteadiness (worse on grass and on uneven concrete)  Gait training with emphasis on safe approaching chair to sit while using rollator (turning at appropriate time  rather than turning prematurely and walking backward). Performed 3 repetitions after therapist provided demonstration of correct performance.   Gait training to negotiate ramp and curb with rollator with therapist instructing pt to lock rollator breaks prior to moving rollator up/down curb. Performed 4x with supervision.  See flowsheet for TUG and gait speed      [facility]    Timed Up and Go Test   TUG Normal TUG   Normal TUG (seconds) 13.93  with single point cane and supervision; 15.32 with rollator                      [facility] Adult PT Treatment/Exercise - 12/25/14 0001    Ambulation/Gait   Gait velocity 2.49 ft/sec  with rollator at "normal" pace                  PT Short Term Goals - 11/27/14 0939    PT SHORT TERM GOAL #1   Title Pt will demonstrate independence with HEP Target: 10/06/14   Status Achieved   PT SHORT TERM GOAL #2   Title Pt will increase BERG score to 36/56 for improving balance.  Target: 10/06/14   Status Achieved   PT SHORT TERM GOAL #3   Title Pt will Decrease TUG time to 14.69 seconds for improving efficiency with functional mobility using  a single point cane. Target 10/06/14   Status Not Met  18.75 on 10/12/14   PT SHORT TERM GOAL #4   Title Pt will ambulate independently on indoor level surfaces with cane.  Target: 10/06/14   Status Not Met  requires CGA and demonstrates inefficient gait pattern with cane   PT SHORT TERM GOAL #5   Title Pt will perform sit to stand without UE support, independently.   Target: 10/06/14   Status Not Met   PT SHORT TERM GOAL #6   Title Pt will increase Berg Balance Test score to 46/56 for decreased fall risk. Target 12/01/14   Status Not Met  Progressing; scored 45/56 on 11/27/14   PT SHORT TERM GOAL #7   Title Pt will decrease TUG time with LRAD to < 13.5 ft/sec for decreased fall risk. Target 12/01/14   Status Not Met   PT SHORT TERM GOAL #8   Title Pt will increase gait speed  to 2.32 ft/sec with LRAD. Target 12/01/14   Status Partially Met  Gait speed increased to 2.63 with RW when walking "fast" but is 1.91 at "normal pace"   PT SHORT TERM GOAL #9   TITLE Pt will ambulate on indoor level surfaces with single point cane with CGA x200' Target 12/01/14   Baseline Revised: to use single point cane rather than no assistive device due to nature of disease causing fragile bones and pt is unlikely to progress to ambulate without device.    Status Achieved           PT Long Term Goals - 12/25/14 1456    PT LONG TERM GOAL #1   Title Pt will increase Berg score to 49/56 for improved balance.  Target 12/29/14   Baseline extended target date to end of renewal   Status Not Met  Scored 43/56 on 10/31/14   PT LONG TERM GOAL #2   Title Pt will ambulate modified independently on indoor level surfaces with cane. Target 12/29/14   Baseline revised to utilize cane rather than no assistive device   Status Not Met  Ambulates MOD I with RW and/or rollator on indoor level surf   PT LONG TERM GOAL #3   Title Pt will ambulate with suprervision on outdoor unlevel grass and concrete wih single point cane. Target 12/29/14   Baseline Revised to allow for single point cane and Extended target to end of renewal   Status Not Met  ambulates on outdoor/uneven surfaces and on curb with rollator and supervision   PT LONG TERM GOAL #4   Title Pt will decrease TUG time to <13.5 seconds with  cane for decreased fall risk and increased independence. TTarget 5/27/1h   Baseline revised to use cane   Status Not Met  TUG with rollator 15.32; with SPC 13.93 on 12/25/14   PT LONG TERM GOAL #5   Title Pt will increase gait speed to >2.63 ft/sec with LRAD for community ambulator status. Target 12/29/14   Baseline Revised to allow for assistive device and extended to end of renewal plan of care    Status Not Met  2.49 ft/sec with rollator on 12/25/14   PT LONG TERM GOAL #6   Title Increase FOTO ABC to 30%  for improved balance conficdence. Target 12/29/14   Baseline extended to end of renewal plan of care   Status On-going               Plan - 12/25/14 1643    Clinical  Impression Statement Checked goals today. Long term goals not met due to plateau in progress, but patient demonstrates decreased scissoring than he did at start of PT and improved foot clearance. Plan to d/c next visit after ensuring safety with curb, turning to sit, and proper HEP is provided.   PT Next Visit Plan FOTO, review curb negotiation with rollator and approaching chair with turn to sit and review/update HEP and d/c.   Consulted and Agree with Plan of Care Patient        Problem List Patient Active Problem List   Diagnosis Date Noted  . Constipation 12/20/2014  . Rash 12/20/2014  . Cellulitis 12/06/2014  . Peripheral edema 12/06/2014  . Gait disorder 09/05/2014  . Multiple myeloma   . Paraplegia 06/21/2014  . Postoperative anemia due to acute blood loss 06/21/2014  . 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 Spine metastasis 06/17/2014  . Thoracic spine tumor 06/17/2014  . Metastatic bone cancer 06/15/2014   Delrae Sawyers, PT,DPT,NCS 12/25/2014 5:00 PM Phone [phone] FAX [phone]         [facility] [address] [place], [place], [address] Phone: [phone]   Fax:  [phone]

## 2014-12-26 DIAGNOSIS — H04123 Dry eye syndrome of bilateral lacrimal glands: Secondary | ICD-10-CM | POA: Diagnosis not present

## 2014-12-26 DIAGNOSIS — H02834 Dermatochalasis of left upper eyelid: Secondary | ICD-10-CM | POA: Diagnosis not present

## 2014-12-26 DIAGNOSIS — H02831 Dermatochalasis of right upper eyelid: Secondary | ICD-10-CM | POA: Diagnosis not present

## 2014-12-26 DIAGNOSIS — H43813 Vitreous degeneration, bilateral: Secondary | ICD-10-CM | POA: Diagnosis not present

## 2014-12-26 DIAGNOSIS — H2513 Age-related nuclear cataract, bilateral: Secondary | ICD-10-CM | POA: Diagnosis not present

## 2014-12-27 ENCOUNTER — Other Ambulatory Visit (HOSPITAL_BASED_OUTPATIENT_CLINIC_OR_DEPARTMENT_OTHER): Payer: Medicare Other

## 2014-12-27 ENCOUNTER — Other Ambulatory Visit: Payer: Self-pay | Admitting: *Deleted

## 2014-12-27 ENCOUNTER — Ambulatory Visit (HOSPITAL_BASED_OUTPATIENT_CLINIC_OR_DEPARTMENT_OTHER): Payer: Medicare Other

## 2014-12-27 VITALS — BP 112/51 | HR 63 | Temp 97.9°F

## 2014-12-27 DIAGNOSIS — Z5112 Encounter for antineoplastic immunotherapy: Secondary | ICD-10-CM

## 2014-12-27 DIAGNOSIS — C9000 Multiple myeloma not having achieved remission: Secondary | ICD-10-CM

## 2014-12-27 LAB — CBC WITH DIFFERENTIAL/PLATELET
BASO%: 0 % (ref 0.0–2.0)
BASOS ABS: 0 10*3/uL (ref 0.0–0.1)
EOS ABS: 0.3 10*3/uL (ref 0.0–0.5)
EOS%: 5.9 % (ref 0.0–7.0)
HEMATOCRIT: 36.3 % — AB (ref 38.4–49.9)
HGB: 12.3 g/dL — ABNORMAL LOW (ref 13.0–17.1)
LYMPH%: 12.1 % — ABNORMAL LOW (ref 14.0–49.0)
MCH: 31.9 pg (ref 27.2–33.4)
MCHC: 33.9 g/dL (ref 32.0–36.0)
MCV: 94 fL (ref 79.3–98.0)
MONO#: 0.9 10*3/uL (ref 0.1–0.9)
MONO%: 15.4 % — ABNORMAL HIGH (ref 0.0–14.0)
NEUT#: 3.8 10*3/uL (ref 1.5–6.5)
NEUT%: 66.6 % (ref 39.0–75.0)
NRBC: 0 % (ref 0–0)
Platelets: 83 10*3/uL — ABNORMAL LOW (ref 140–400)
RBC: 3.86 10*6/uL — AB (ref 4.20–5.82)
RDW: 15.2 % — ABNORMAL HIGH (ref 11.0–14.6)
WBC: 5.6 10*3/uL (ref 4.0–10.3)
lymph#: 0.7 10*3/uL — ABNORMAL LOW (ref 0.9–3.3)

## 2014-12-27 LAB — BASIC METABOLIC PANEL (CC13)
ANION GAP: 13 meq/L — AB (ref 3–11)
BUN: 24.7 mg/dL (ref 7.0–26.0)
CO2: 25 meq/L (ref 22–29)
CREATININE: 0.9 mg/dL (ref 0.7–1.3)
Calcium: 8.5 mg/dL (ref 8.4–10.4)
Chloride: 99 mEq/L (ref 98–109)
EGFR: 76 mL/min/{1.73_m2} — ABNORMAL LOW (ref >90)
Glucose: 86 mg/dl (ref 70–140)
POTASSIUM: 3.9 meq/L (ref 3.5–5.1)
SODIUM: 137 meq/L (ref 136–145)

## 2014-12-27 MED ORDER — BORTEZOMIB CHEMO SQ INJECTION 3.5 MG (2.5MG/ML)
1.3000 mg/m2 | Freq: Once | INTRAMUSCULAR | Status: AC
  Administered 2014-12-27: 2.5 mg via SUBCUTANEOUS
  Filled 2014-12-27: qty 2.5

## 2014-12-27 MED ORDER — ONDANSETRON HCL 8 MG PO TABS
8.0000 mg | ORAL_TABLET | Freq: Once | ORAL | Status: AC
  Administered 2014-12-27: 8 mg via ORAL

## 2014-12-27 MED ORDER — ONDANSETRON HCL 8 MG PO TABS
ORAL_TABLET | ORAL | Status: AC
  Filled 2014-12-27: qty 1

## 2014-12-27 MED ORDER — SODIUM CHLORIDE 0.9 % IV SOLN
Freq: Once | INTRAVENOUS | Status: AC
  Administered 2014-12-27: 11:00:00 via INTRAVENOUS

## 2014-12-27 MED ORDER — ZOLEDRONIC ACID 4 MG/100ML IV SOLN
4.0000 mg | Freq: Once | INTRAVENOUS | Status: AC
  Administered 2014-12-27: 4 mg via INTRAVENOUS
  Filled 2014-12-27: qty 100

## 2014-12-27 NOTE — Patient Instructions (Signed)
[facility] Discharge Instructions for Patients Receiving Chemotherapy  Today you received the following chemotherapy agents Velcade. To help prevent nausea and vomiting after your treatment, we encourage you to take your nausea medication as directed.  If you develop nausea and vomiting that is not controlled by your nausea medication, call the clinic.   BELOW ARE SYMPTOMS THAT SHOULD BE REPORTED IMMEDIATELY:  *FEVER GREATER THAN 100.5 F  *CHILLS WITH OR WITHOUT FEVER  NAUSEA AND VOMITING THAT IS NOT CONTROLLED WITH YOUR NAUSEA MEDICATION  *UNUSUAL SHORTNESS OF BREATH  *UNUSUAL BRUISING OR BLEEDING  TENDERNESS IN MOUTH AND THROAT WITH OR WITHOUT PRESENCE OF ULCERS  *URINARY PROBLEMS  *BOWEL PROBLEMS  UNUSUAL RASH Items with * indicate a potential emergency and should be followed up as soon as possible.  Feel free to call the clinic you have any questions or concerns. The clinic phone number is [phone].  Please show the CHEMO ALERT CARD at check-in to the Emergency Department and triage nurse.    

## 2014-12-27 NOTE — Progress Notes (Signed)
Verbal consent given by Dr. Julien Nordmann to treat pt with platlet count of 83 today

## 2014-12-28 ENCOUNTER — Ambulatory Visit: Payer: Medicare Other

## 2014-12-28 DIAGNOSIS — R6889 Other general symptoms and signs: Secondary | ICD-10-CM | POA: Diagnosis not present

## 2014-12-28 DIAGNOSIS — Z7409 Other reduced mobility: Secondary | ICD-10-CM | POA: Diagnosis not present

## 2014-12-28 DIAGNOSIS — M2560 Stiffness of unspecified joint, not elsewhere classified: Secondary | ICD-10-CM | POA: Diagnosis not present

## 2014-12-28 DIAGNOSIS — C9000 Multiple myeloma not having achieved remission: Secondary | ICD-10-CM | POA: Diagnosis not present

## 2014-12-28 DIAGNOSIS — D492 Neoplasm of unspecified behavior of bone, soft tissue, and skin: Secondary | ICD-10-CM | POA: Diagnosis not present

## 2014-12-28 DIAGNOSIS — R262 Difficulty in walking, not elsewhere classified: Secondary | ICD-10-CM

## 2014-12-28 DIAGNOSIS — C7951 Secondary malignant neoplasm of bone: Secondary | ICD-10-CM | POA: Diagnosis not present

## 2014-12-28 DIAGNOSIS — R279 Unspecified lack of coordination: Secondary | ICD-10-CM

## 2014-12-28 NOTE — Therapy (Signed)
[facility] [address] [place], [place], [address] Phone: [phone]   Fax:  [phone]  Physical Therapy Treatment  Patient Details  Name: Jeremiah Owens MRN: [id number] Date of Birth: 12/29/1929 Referring Provider:  Wenda Low, MD  Encounter Date: 12/28/2014      PT End of Session - 12/29/14 0832    Visit Number 30   Number of Visits 33   Date for PT Re-Evaluation 12/29/14   Authorization Type Medicare [employer], [employer] secondary G codes required   PT Start Time 0801   PT Stop Time 0845   PT Time Calculation (min) 44 min      Past Medical History  Diagnosis Date  . Compression fracture   . Melanoma   . Bone cancer     t_spine T-5  . Prostate cancer 2002  . Skin cancer 1994    melanoma  right neck   . Multiple myeloma   . Allergy   . Anxiety   . Thyroid disease   . S/P radiation therapy 08/21/14-09/04/14    T4-6 25Gy/73f    Past Surgical History  Procedure Laterality Date  . Hernia repair    . Posterior cervical fusion/foraminotomy N/A 06/17/2014    Procedure: Thoracic five laminectomy for epidural tumor resection Thoracic 4-7 posterior lateral arthrodesis, segmental pedicle screw fixation.;  Surgeon: KAshok Pall MD;  [facility];  Service: Neurosurgery;  Laterality: N/A;    There were no vitals filed for this visit.  Visit Diagnosis:  Lack of coordination  Difficulty walking      Subjective Assessment - 12/28/14 0805    Subjective Pt went on a walk with wife for 25 minutes yesterday and also spent time walking in the community (at a restaurant etc); also did some of his HEP. He is realizing that he will need to be on the rollator long term.   Currently in Pain? No/denies            [facility] 12/29/14 0001    Observation/Other Assessments   Focus on Therapeutic Outcomes (FOTO)  FS 49   Activities of Balance Confidence Scale (ABC Scale)  56.3%      Gait training: Reviewed curb, ramp, chair access (4x each) with rollator with MOD I performance.  Therex: Reviewed all previously provided HEP exercises with correct performance of each exercises. Therapist wrote on pt's handout recommendations for updated frequency. Discontinued lower trunk rotation stretch, bilateral knee to chest stretch.                        PT Education - 12/29/14 0831    Education provided Yes   Education Details reiterated recommendation to not ambulate at all without walker; and recomendation to continue to use a walker through lifespan. Reviewed and updated HEP.   Person(s) Educated Patient   Methods Explanation;Handout   Comprehension Verbalized understanding          PT Short Term Goals - 11/27/14 0939    PT SHORT TERM GOAL #1   Title Pt will demonstrate independence with HEP Target: 10/06/14   Status Achieved   PT SHORT TERM GOAL #2   Title Pt will increase BERG score to 36/56 for improving balance.  Target: 10/06/14   Status Achieved   PT SHORT TERM GOAL #3   Title Pt will Decrease TUG time to 14.69 seconds for improving efficiency with functional mobility using a single point cane. Target 10/06/14  Status Not Met  18.75 on 10/12/14   PT SHORT TERM GOAL #4   Title Pt will ambulate independently on indoor level surfaces with cane.  Target: 10/06/14   Status Not Met  requires CGA and demonstrates inefficient gait pattern with cane   PT SHORT TERM GOAL #5   Title Pt will perform sit to stand without UE support, independently.   Target: 10/06/14   Status Not Met   PT SHORT TERM GOAL #6   Title Pt will increase Berg Balance Test score to 46/56 for decreased fall risk. Target 12/01/14   Status Not Met  Progressing; scored 45/56 on 11/27/14   PT SHORT TERM GOAL #7   Title Pt will decrease TUG time with LRAD to < 13.5 ft/sec for decreased fall risk. Target 12/01/14   Status Not Met   PT SHORT TERM GOAL #8   Title Pt will increase gait speed  to 2.32 ft/sec with LRAD. Target 12/01/14   Status Partially Met  Gait speed increased to 2.63 with RW when walking "fast" but is 1.91 at "normal pace"   PT SHORT TERM GOAL #9   TITLE Pt will ambulate on indoor level surfaces with single point cane with CGA x200' Target 12/01/14   Baseline Revised: to use single point cane rather than no assistive device due to nature of disease causing fragile bones and pt is unlikely to progress to ambulate without device.    Status Achieved           PT Long Term Goals - 12/29/14 0839    PT LONG TERM GOAL #1   Title Pt will increase Berg score to 49/56 for improved balance.  Target 12/29/14   Baseline extended target date to end of renewal   Status Not Met  Scored 43/56 on 10/31/14   PT LONG TERM GOAL #2   Title Pt will ambulate modified independently on indoor level surfaces with cane. Target 12/29/14   Baseline revised to utilize cane rather than no assistive device   Status Not Met  Ambulates MOD I with RW and/or rollator on indoor level surf   PT LONG TERM GOAL #3   Title Pt will ambulate with suprervision on outdoor unlevel grass and concrete wih single point cane. Target 12/29/14   Baseline Revised to allow for single point cane and Extended target to end of renewal   Status Not Met  ambulates on outdoor/uneven surfaces and on curb with rollator and supervision   PT LONG TERM GOAL #4   Title Pt will decrease TUG time to <13.5 seconds with  cane for decreased fall risk and increased independence. TTarget 5/27/1h   Baseline revised to use cane   Status Not Met  TUG with rollator 15.32; with SPC 13.93 on 12/25/14   PT LONG TERM GOAL #5   Title Pt will increase gait speed to >2.63 ft/sec with LRAD for community ambulator status. Target 12/29/14   Baseline Revised to allow for assistive device and extended to end of renewal plan of care    Status Not Met  2.49 ft/sec with rollator on 12/25/14   PT LONG TERM GOAL #6   Title Increase FOTO ABC to 30%  for improved balance conficdence. Target 12/29/14   Baseline extended to end of renewal plan of care   Status Achieved  ABC 56.3% on d/c               Plan - 12/29/14 0833    Clinical Impression Statement Reviewed  all previously provided HEP exercises today. Instructed pt to continue walking every other day with rollator and wife present, and to perform stretching exercises every day, and balance/strengthening exercises on his walking "off days". D/C today. Pt is aware that he may benefit from skilled PT again in the future if he has a sudden decrease in function, in which case a new MD order for PT will be needed, but at this time his functional progress with skilled PT has plateaued.   Consulted and Agree with Plan of Care Patient          G-Codes - 01/08/2015 0854    Functional Assessment Tool Used Merrilee Jansky 39/56   Functional Limitation Mobility: Walking and moving around   Mobility: Walking and Moving Around Goal Status [phone]) At least 1 percent but less than 20 percent impaired, limited or restricted   Mobility: Walking and Moving Around Discharge Status [phone]) At least 20 percent but less than 40 percent impaired, limited or restricted      Problem List Patient Active Problem List   Diagnosis Date Noted  . Constipation 12/20/2014  . Rash 12/20/2014  . Cellulitis 12/06/2014  . Peripheral edema 12/06/2014  . Gait disorder 09/05/2014  . Multiple myeloma   . Paraplegia 06/21/2014  . Postoperative anemia due to acute blood loss 06/21/2014  . 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 Spine metastasis 06/17/2014  . Thoracic spine tumor 06/17/2014  . Metastatic bone cancer 06/15/2014    Delrae Sawyers, PT,DPT,NCS 01-08-15 8:55 AM Phone [phone] FAX [phone]         [facility] [address] [place], [place], [address] Phone: [phone]   Fax:  [phone]   PHYSICAL THERAPY DISCHARGE  SUMMARY  Visits from Start of Care: 30  Current functional level related to goals / functional outcomes: Pt met 3/8 and partially met 1/8 short term goals. Pt met 1/6 long term goals. Progress toward goals varied--dependent on whether it was a cancer treatment "on" week (poorer performance) or "off" week (better performance). Pt initially made good progress but over the past month, pt has reached a plateau in progress.   Remaining deficits: Balance impairment, muscle weakness, gait impairments, risk of falls   Education / Equipment: Safe use of rollator, fall prevention strategies, HEP  Plan: Patient agrees to discharge.  Patient goals were partially met. Patient is being discharged due to                                                    plateau in progress. ?????

## 2014-12-29 ENCOUNTER — Other Ambulatory Visit: Payer: Self-pay | Admitting: Medical Oncology

## 2014-12-29 DIAGNOSIS — C9000 Multiple myeloma not having achieved remission: Secondary | ICD-10-CM

## 2014-12-29 MED ORDER — LENALIDOMIDE 25 MG PO CAPS: 25.0000 mg | ORAL_CAPSULE | Freq: Every day | ORAL | Status: DC

## 2014-12-29 NOTE — Progress Notes (Signed)
revlimid refill faxed 

## 2015-01-02 ENCOUNTER — Other Ambulatory Visit: Payer: Self-pay | Admitting: Medical Oncology

## 2015-01-02 DIAGNOSIS — C9000 Multiple myeloma not having achieved remission: Secondary | ICD-10-CM

## 2015-01-03 ENCOUNTER — Ambulatory Visit (HOSPITAL_BASED_OUTPATIENT_CLINIC_OR_DEPARTMENT_OTHER): Payer: Medicare Other

## 2015-01-03 ENCOUNTER — Other Ambulatory Visit (HOSPITAL_BASED_OUTPATIENT_CLINIC_OR_DEPARTMENT_OTHER): Payer: Medicare Other

## 2015-01-03 ENCOUNTER — Other Ambulatory Visit: Payer: Self-pay | Admitting: Internal Medicine

## 2015-01-03 DIAGNOSIS — C9000 Multiple myeloma not having achieved remission: Secondary | ICD-10-CM

## 2015-01-03 DIAGNOSIS — Z5112 Encounter for antineoplastic immunotherapy: Secondary | ICD-10-CM

## 2015-01-03 LAB — COMPREHENSIVE METABOLIC PANEL (CC13)
ALBUMIN: 3.3 g/dL — AB (ref 3.5–5.0)
ALT: 23 U/L (ref 0–55)
ANION GAP: 7 meq/L (ref 3–11)
AST: 21 U/L (ref 5–34)
Alkaline Phosphatase: 66 U/L (ref 40–150)
BILIRUBIN TOTAL: 0.54 mg/dL (ref 0.20–1.20)
BUN: 16.4 mg/dL (ref 7.0–26.0)
CO2: 29 meq/L (ref 22–29)
Calcium: 8.9 mg/dL (ref 8.4–10.4)
Chloride: 100 mEq/L (ref 98–109)
Creatinine: 0.8 mg/dL (ref 0.7–1.3)
EGFR: 81 mL/min/{1.73_m2} — ABNORMAL LOW (ref >90)
Glucose: 98 mg/dl (ref 70–140)
POTASSIUM: 4.2 meq/L (ref 3.5–5.1)
Sodium: 137 mEq/L (ref 136–145)
Total Protein: 5.8 g/dL — ABNORMAL LOW (ref 6.4–8.3)

## 2015-01-03 LAB — CBC WITH DIFFERENTIAL/PLATELET
BASO%: 0.3 % (ref 0.0–2.0)
Basophils Absolute: 0 10*3/uL (ref 0.0–0.1)
EOS%: 3.6 % (ref 0.0–7.0)
Eosinophils Absolute: 0.1 10*3/uL (ref 0.0–0.5)
HCT: 37.4 % — ABNORMAL LOW (ref 38.4–49.9)
HEMOGLOBIN: 12.7 g/dL — AB (ref 13.0–17.1)
LYMPH%: 9.3 % — AB (ref 14.0–49.0)
MCH: 32.1 pg (ref 27.2–33.4)
MCHC: 34 g/dL (ref 32.0–36.0)
MCV: 94.4 fL (ref 79.3–98.0)
MONO#: 0.2 10*3/uL (ref 0.1–0.9)
MONO%: 6 % (ref 0.0–14.0)
NEUT#: 3.1 10*3/uL (ref 1.5–6.5)
NEUT%: 80.8 % — AB (ref 39.0–75.0)
Platelets: 113 10*3/uL — ABNORMAL LOW (ref 140–400)
RBC: 3.96 10*6/uL — ABNORMAL LOW (ref 4.20–5.82)
RDW: 15 % — ABNORMAL HIGH (ref 11.0–14.6)
WBC: 3.9 10*3/uL — ABNORMAL LOW (ref 4.0–10.3)
lymph#: 0.4 10*3/uL — ABNORMAL LOW (ref 0.9–3.3)

## 2015-01-03 MED ORDER — ONDANSETRON HCL 8 MG PO TABS
8.0000 mg | ORAL_TABLET | Freq: Once | ORAL | Status: AC
  Administered 2015-01-03: 8 mg via ORAL

## 2015-01-03 MED ORDER — ONDANSETRON HCL 8 MG PO TABS
ORAL_TABLET | ORAL | Status: AC
  Filled 2015-01-03: qty 1

## 2015-01-03 MED ORDER — BORTEZOMIB CHEMO SQ INJECTION 3.5 MG (2.5MG/ML)
1.3000 mg/m2 | Freq: Once | INTRAMUSCULAR | Status: AC
  Administered 2015-01-03: 2.5 mg via SUBCUTANEOUS
  Filled 2015-01-03: qty 2.5

## 2015-01-03 NOTE — Patient Instructions (Signed)
[facility] Discharge Instructions for Patients Receiving Chemotherapy  Today you received the following chemotherapy agents Velcade. To help prevent nausea and vomiting after your treatment, we encourage you to take your nausea medication as directed.  If you develop nausea and vomiting that is not controlled by your nausea medication, call the clinic.   BELOW ARE SYMPTOMS THAT SHOULD BE REPORTED IMMEDIATELY:  *FEVER GREATER THAN 100.5 F  *CHILLS WITH OR WITHOUT FEVER  NAUSEA AND VOMITING THAT IS NOT CONTROLLED WITH YOUR NAUSEA MEDICATION  *UNUSUAL SHORTNESS OF BREATH  *UNUSUAL BRUISING OR BLEEDING  TENDERNESS IN MOUTH AND THROAT WITH OR WITHOUT PRESENCE OF ULCERS  *URINARY PROBLEMS  *BOWEL PROBLEMS  UNUSUAL RASH Items with * indicate a potential emergency and should be followed up as soon as possible.  Feel free to call the clinic you have any questions or concerns. The clinic phone number is [phone].  Please show the CHEMO ALERT CARD at check-in to the Emergency Department and triage nurse.    

## 2015-01-04 ENCOUNTER — Other Ambulatory Visit: Payer: Self-pay | Admitting: Internal Medicine

## 2015-01-04 DIAGNOSIS — C9000 Multiple myeloma not having achieved remission: Secondary | ICD-10-CM | POA: Diagnosis not present

## 2015-01-05 NOTE — Progress Notes (Signed)
  Radiation Oncology         [phone] ________________________________  Name: Macrae Wiegman MRN: [id number]  Date: 11/24/2014  DOB: 1930-04-28  SIMULATION AND TREATMENT PLANNING NOTE  DIAGNOSIS:  Multiple myeloma  Site:  Right humerus/shoulder/scapula  NARRATIVE:  The patient was brought to the [facility].  Identity was confirmed.  All relevant records and images related to the planned course of therapy were reviewed.   Written consent to proceed with treatment was confirmed which was freely given after reviewing the details related to the planned course of therapy had been reviewed with the patient.  Then, the patient was set-up in a stable reproducible  supine position for radiation therapy.  CT images were obtained.  Surface markings were placed.    Medically necessary complex treatment device(s) for immobilization:  Customized vac lock bag.   The CT images were loaded into the planning software.  Then the target and avoidance structures were contoured.  Treatment planning then occurred.  The radiation prescription was entered and confirmed.  A total of 2 complex treatment devices were fabricated which relate to the designed radiation treatment fields. Each of these customized fields/ complex treatment devices will be used on a daily basis during the radiation course. I have requested : Isodose Plan.   PLAN:  The patient will receive 25 Gy in 10 fractions.  ________________________________   Jodelle Gross, MD, PhD

## 2015-01-05 NOTE — Addendum Note (Signed)
Encounter addended by: Kyung Rudd, MD on: 01/05/2015 11:14 AM<BR>     Documentation filed: Notes Section, Visit Diagnoses

## 2015-01-05 NOTE — Progress Notes (Signed)
  Radiation Oncology         [phone] ________________________________  Name: Jeremiah Owens MRN: [id number]  Date: 12/12/2014  DOB: 09-30-1929  End of Treatment Note  Diagnosis:   Multiple myeloma     Indication for treatment::  palliative       Radiation treatment dates:   11/29/2014 through 12/12/2014  Site/dose:   The patient was treated to the right proximal humerus/shoulder/scapula to a dose of 25 gray in 10 fractions using AP PA fields. A complex treatment plan was utilized.  Narrative: The patient tolerated radiation treatment relatively well.   The patient's pain improved markedly during the course of his radiation treatment.  Plan: The patient has completed radiation treatment. The patient will return to radiation oncology clinic for routine followup in one month. I advised the patient to call or return sooner if they have any questions or concerns related to their recovery or treatment. ________________________________  Jodelle Gross, M.D., Ph.D.

## 2015-01-09 ENCOUNTER — Other Ambulatory Visit: Payer: Self-pay | Admitting: Physician Assistant

## 2015-01-09 DIAGNOSIS — C9000 Multiple myeloma not having achieved remission: Secondary | ICD-10-CM

## 2015-01-10 ENCOUNTER — Other Ambulatory Visit (HOSPITAL_BASED_OUTPATIENT_CLINIC_OR_DEPARTMENT_OTHER): Payer: Medicare Other

## 2015-01-10 ENCOUNTER — Telehealth: Payer: Self-pay | Admitting: Internal Medicine

## 2015-01-10 ENCOUNTER — Ambulatory Visit (HOSPITAL_BASED_OUTPATIENT_CLINIC_OR_DEPARTMENT_OTHER): Payer: Medicare Other | Admitting: Physician Assistant

## 2015-01-10 ENCOUNTER — Encounter: Payer: Self-pay | Admitting: Physician Assistant

## 2015-01-10 ENCOUNTER — Ambulatory Visit (HOSPITAL_BASED_OUTPATIENT_CLINIC_OR_DEPARTMENT_OTHER): Payer: Medicare Other

## 2015-01-10 ENCOUNTER — Ambulatory Visit: Payer: Medicare Other

## 2015-01-10 VITALS — BP 115/54 | HR 78 | Temp 98.6°F | Resp 18 | Ht 68.0 in | Wt 163.1 lb

## 2015-01-10 DIAGNOSIS — C9000 Multiple myeloma not having achieved remission: Secondary | ICD-10-CM

## 2015-01-10 DIAGNOSIS — R600 Localized edema: Secondary | ICD-10-CM | POA: Diagnosis not present

## 2015-01-10 DIAGNOSIS — C7951 Secondary malignant neoplasm of bone: Secondary | ICD-10-CM

## 2015-01-10 DIAGNOSIS — L299 Pruritus, unspecified: Secondary | ICD-10-CM | POA: Diagnosis not present

## 2015-01-10 DIAGNOSIS — Z5112 Encounter for antineoplastic immunotherapy: Secondary | ICD-10-CM

## 2015-01-10 DIAGNOSIS — C419 Malignant neoplasm of bone and articular cartilage, unspecified: Secondary | ICD-10-CM

## 2015-01-10 LAB — COMPREHENSIVE METABOLIC PANEL (CC13)
ALBUMIN: 3.4 g/dL — AB (ref 3.5–5.0)
ALT: 31 U/L (ref 0–55)
ANION GAP: 8 meq/L (ref 3–11)
AST: 27 U/L (ref 5–34)
Alkaline Phosphatase: 67 U/L (ref 40–150)
BILIRUBIN TOTAL: 0.6 mg/dL (ref 0.20–1.20)
BUN: 16.9 mg/dL (ref 7.0–26.0)
CHLORIDE: 101 meq/L (ref 98–109)
CO2: 28 mEq/L (ref 22–29)
Calcium: 8.8 mg/dL (ref 8.4–10.4)
Creatinine: 0.8 mg/dL (ref 0.7–1.3)
EGFR: 82 mL/min/{1.73_m2} — AB (ref >90)
GLUCOSE: 101 mg/dL (ref 70–140)
POTASSIUM: 4.2 meq/L (ref 3.5–5.1)
Sodium: 137 mEq/L (ref 136–145)
Total Protein: 5.8 g/dL — ABNORMAL LOW (ref 6.4–8.3)

## 2015-01-10 LAB — LACTATE DEHYDROGENASE (CC13): LDH: 227 U/L (ref 125–245)

## 2015-01-10 LAB — CBC WITH DIFFERENTIAL/PLATELET
BASO%: 0.2 % (ref 0.0–2.0)
Basophils Absolute: 0 10*3/uL (ref 0.0–0.1)
EOS ABS: 0.1 10*3/uL (ref 0.0–0.5)
EOS%: 2.7 % (ref 0.0–7.0)
HEMATOCRIT: 36 % — AB (ref 38.4–49.9)
HGB: 12.3 g/dL — ABNORMAL LOW (ref 13.0–17.1)
LYMPH%: 7.6 % — ABNORMAL LOW (ref 14.0–49.0)
MCH: 32 pg (ref 27.2–33.4)
MCHC: 34.2 g/dL (ref 32.0–36.0)
MCV: 93.8 fL (ref 79.3–98.0)
MONO#: 0.2 10*3/uL (ref 0.1–0.9)
MONO%: 3.9 % (ref 0.0–14.0)
NEUT#: 3.5 10*3/uL (ref 1.5–6.5)
NEUT%: 85.6 % — AB (ref 39.0–75.0)
Platelets: 117 10*3/uL — ABNORMAL LOW (ref 140–400)
RBC: 3.84 10*6/uL — ABNORMAL LOW (ref 4.20–5.82)
RDW: 15.3 % — ABNORMAL HIGH (ref 11.0–14.6)
WBC: 4.1 10*3/uL (ref 4.0–10.3)
lymph#: 0.3 10*3/uL — ABNORMAL LOW (ref 0.9–3.3)

## 2015-01-10 MED ORDER — ONDANSETRON HCL 8 MG PO TABS
8.0000 mg | ORAL_TABLET | Freq: Once | ORAL | Status: AC
  Administered 2015-01-10: 8 mg via ORAL

## 2015-01-10 MED ORDER — ONDANSETRON HCL 8 MG PO TABS
ORAL_TABLET | ORAL | Status: AC
  Filled 2015-01-10: qty 1

## 2015-01-10 MED ORDER — BORTEZOMIB CHEMO SQ INJECTION 3.5 MG (2.5MG/ML)
1.3000 mg/m2 | Freq: Once | INTRAMUSCULAR | Status: AC
  Administered 2015-01-10: 2.5 mg via SUBCUTANEOUS
  Filled 2015-01-10: qty 2.5

## 2015-01-10 MED ORDER — PROCHLORPERAZINE MALEATE 10 MG PO TABS: 10.0000 mg | ORAL_TABLET | Freq: Four times a day (QID) | ORAL | Status: DC | PRN

## 2015-01-10 NOTE — Telephone Encounter (Signed)
Pt will get new sched in chemo

## 2015-01-10 NOTE — Patient Instructions (Addendum)
[facility] Discharge Instructions for Patients Receiving Chemotherapy  Today you received the following chemotherapy agents: Velcade  To help prevent nausea and vomiting after your treatment, we encourage you to take your nausea medication as prescribed by your physician.   If you develop nausea and vomiting that is not controlled by your nausea medication, call the clinic.   BELOW ARE SYMPTOMS THAT SHOULD BE REPORTED IMMEDIATELY:  *FEVER GREATER THAN 100.5 F  *CHILLS WITH OR WITHOUT FEVER  NAUSEA AND VOMITING THAT IS NOT CONTROLLED WITH YOUR NAUSEA MEDICATION  *UNUSUAL SHORTNESS OF BREATH  *UNUSUAL BRUISING OR BLEEDING  TENDERNESS IN MOUTH AND THROAT WITH OR WITHOUT PRESENCE OF ULCERS  *URINARY PROBLEMS  *BOWEL PROBLEMS  UNUSUAL RASH Items with * indicate a potential emergency and should be followed up as soon as possible.  Feel free to call the clinic should you have any questions or concerns. The clinic phone number is [phone].  Please show the [place] at check-in to the Emergency Department and triage nurse.

## 2015-01-10 NOTE — Progress Notes (Addendum)
[place] [phone]   Fax:([phone]) [place] [address] [employer] [address] [place] [address]  DIAGNOSIS: Multiple myeloma diagnosed in November 2015  PRIOR THERAPY:  1) Status post Thoracic five laminectomy for epidural tumor resection, Thoracic 4-7 posterior lateral arthrodesis,  segmental pedicle screw fixation T4-T7 Globus instrumentation under the care of Dr. Christella Noa on 06/18/2014. 2) Systemic chemotherapy with Velcade 1.3 MG/M2 subcutaneously weekly in addition to Decadron 40 mg by mouth on weekly basis. Status post 11 cycles. 3) radiation therapy under the care of Dr. Lisbeth Renshaw completed 12/28/2014  CURRENT THERAPY:  1) Systemic chemotherapy with Velcade 1.3 MG/M2 subcutaneously weekly, Decadron 40 mg by mouth weekly in addition to Revlimid 25 MG by mouth daily for 21 days every 4 weeks, status post 3 cycles. 2) Zometa 4 mg IV every month. First dose 11/01/2014.  INTERVAL HISTORY: Jeremiah Owens 81 y.o. male returns to the clinic today for follow-up visit accompanied by his wife. The patient is currently on treatment with weekly subcutaneous Velcade, Revlimid and Decadron status post 3 cycles and tolerating the treatment fairly well. Since his last visit, he has seen a dermatologist for a rash to his right lower abdomen/groin. He had a biopsy of the rash and was told that it was inflammation. He has a follow-up with the dermatologist tomorrow. He had a delay in starting cycle #4 of his Revlimid. He started cycle 4 01/09/2015 instead of 01/06/2015 because of the delay of shipment from [place] [facility]. Overall he continues to tolerate his treatment with Revlimid and Velcade and dexamethasone without difficulty. He does report that he knows wife take a trip from August 1 through August 5 2 [facility] conference every year. They would like to continue to do this and they have already made their deposits. I have assured  him that we can adjust his treatment to accommodate this previously planned trip.  He had 10 radiation treatments to his right shoulder under the care of Dr. Lisbeth Renshaw and now that skin is red and pruritic. He has been applying radioplex gel to this area. He denies fevers, chills, nausea, or vomiting. He denied having any significant chest pain, shortness of breath, cough or hemoptysis. He notes improvement in the lower extremity edema. He also notes significant improvement in the dryness and erythema of his lower extremities with the use of Aquaphor after I given him some samples at his last visit.  MEDICAL HISTORY: Past Medical History  Diagnosis Date  . Compression fracture   . Melanoma   . Bone cancer     t_spine T-5  . Prostate cancer 2002  . Skin cancer 1994    melanoma  right neck   . Multiple myeloma   . Allergy   . Anxiety   . Thyroid disease   . S/P radiation therapy 08/21/14-09/04/14    T4-6 25Gy/58f    ALLERGIES:  is allergic to iodine.  MEDICATIONS:  Current Outpatient Prescriptions  Medication Sig Dispense Refill  . acyclovir (ZOVIRAX) 400 MG tablet TAKE 1 TABLET BY MOUTH TWICE A DAY 60 tablet 2  . Calcium Carbonate-Vitamin D (CALTRATE 600+D PO) Take 600 mg by mouth 2 (two) times daily.    . cholecalciferol (VITAMIN D) 1000 UNITS tablet Take 1,000 Units by mouth at bedtime.     .Marland Kitchendexamethasone (DECADRON) 4 MG tablet 10 TAB EVERY WEEK, START WITH CHEMO 40 tablet 4  . furosemide (LASIX) 20 MG tablet Take 1  tablet (20 mg total) by mouth daily. As needed for swelling (Patient taking differently: Take 20 mg by mouth 2 (two) times daily. As needed for swelling) 10 tablet 0  . hyaluronate sodium (RADIAPLEXRX) GEL Apply 1 application topically daily. Apply to treated area on skin after rad txs daily and prn    . HYDROcodone-acetaminophen (NORCO) 5-325 MG per tablet Take 1 tablet by mouth every 6 (six) hours as needed for moderate pain. 60 tablet 0  . HYDROCORTISONE, TOPICAL, 2.5 %  SOLN Apply 1 application topically daily as needed.     Marland Kitchen lenalidomide (REVLIMID) 25 MG capsule Take 1 capsule (25 mg total) by mouth daily. Take one capsule ( 25 mg) by mouth for 21 days every 4 weeks-12/29/14 Authorization [id number]  adult male 21 capsule 0  . levothyroxine (SYNTHROID, LEVOTHROID) 25 MCG tablet Take 1 tablet (25 mcg total) by mouth daily. 30 tablet 4  . methocarbamol (ROBAXIN) 500 MG tablet TAKE 1 TABLET (500 MG TOTAL) BY MOUTH EVERY 6 (SIX) HOURS AS NEEDED FOR MUSCLE SPASMS. 60 tablet 0  . Multiple Vitamins-Minerals (CENTRUM SILVER PO) Take 1 tablet by mouth daily.    . NON FORMULARY Prunes prn    . omeprazole (PRILOSEC) 40 MG capsule Take 40 mg by mouth daily. In the afternoon  11  . Polyvinyl Alcohol-Povidone (REFRESH OP) Apply 1 drop to eye daily as needed (dry/irritated eyes).    . potassium chloride (K-DUR,KLOR-CON) 10 MEQ tablet Take 10 mEq by mouth daily.    . prochlorperazine (COMPAZINE) 10 MG tablet TAKE 1 TABLET BY MOUTH EVERY 6 HOURS AS NEEDED FOR NAUSEA AND VOMITING 30 tablet 0  . ranitidine (ZANTAC) 150 MG tablet Take 150 mg by mouth at bedtime.   11  . tamsulosin (FLOMAX) 0.4 MG CAPS capsule Take 1 capsule (0.4 mg total) by mouth at bedtime. 30 capsule 3  . warfarin (COUMADIN) 2 MG tablet Take 1 tablet (2 mg total) by mouth daily. 30 tablet 3   No current facility-administered medications for this visit.    SURGICAL HISTORY:  Past Surgical History  Procedure Laterality Date  . Hernia repair    . Posterior cervical fusion/foraminotomy N/A 06/17/2014    Procedure: Thoracic five laminectomy for epidural tumor resection Thoracic 4-7 posterior lateral arthrodesis, segmental pedicle screw fixation.;  Surgeon: Jeremiah Pall, MD;  Location: [facility];  Service: Neurosurgery;  Laterality: N/A;    REVIEW OF SYSTEMS:  Constitutional: positive for fatigue Eyes: negative Ears, nose, mouth, throat, and face: negative Respiratory: negative Cardiovascular:  negative Gastrointestinal: positive for constipation Genitourinary:negative Integument/breast: positive for rash Hematologic/lymphatic: negative Musculoskeletal:positive for bone pain and muscle weakness Neurological: negative Behavioral/Psych: negative Endocrine: negative Allergic/Immunologic: negative   PHYSICAL EXAMINATION: General appearance: alert, cooperative, fatigued and no distress Head: Normocephalic, without obvious abnormality, atraumatic Neck: no adenopathy, no JVD, supple, symmetrical, trachea midline and thyroid not enlarged, symmetric, no tenderness/mass/nodules Lymph nodes: Cervical, supraclavicular, and axillary nodes normal. Resp: clear to auscultation bilaterally Back: symmetric, no curvature. ROM normal. No CVA tenderness. Cardio: regular rate and rhythm, S1, S2 normal, no murmur, click, rub or gallop GI: soft, non-tender; bowel sounds normal; no masses,  no organomegaly Extremities: edema 1+ edema bilaterally with erythema on the legs with improvement in the erythema and dryness with the use of Aquaphor Neurologic: Alert and oriented X 3, normal strength and tone. Normal symmetric reflexes. Normal coordination and gait Motor: grossly normal Weakness in the left foot  Decreased erythematous papules to right shoulder from radiation. Minimal erythema  on the abdomen, without discrete lesions  ECOG PERFORMANCE STATUS: 1 - Symptomatic but completely ambulatory  Blood pressure 115/54, pulse 78, temperature 98.6 F (37 C), temperature source Oral, resp. rate 18, height '5\' 8"'  (1.727 m), weight 163 lb 1.6 oz (73.982 kg), SpO2 97 %.  LABORATORY DATA: Lab Results  Component Value Date   WBC 4.1 01/10/2015   HGB 12.3* 01/10/2015   HCT 36.0* 01/10/2015   MCV 93.8 01/10/2015   PLT 117* 01/10/2015      Chemistry      Component Value Date/Time   NA 137 01/10/2015 1045   NA 133* 07/04/2014 0430   K 4.2 01/10/2015 1045   K 4.1 07/04/2014 0430   CL 97 07/04/2014 0430     CO2 28 01/10/2015 1045   CO2 27 07/04/2014 0430   BUN 16.9 01/10/2015 1045   BUN 18 07/04/2014 0430   CREATININE 0.8 01/10/2015 1045   CREATININE 0.90 07/04/2014 0430      Component Value Date/Time   CALCIUM 8.8 01/10/2015 1045   CALCIUM 8.9 07/04/2014 0430   ALKPHOS 67 01/10/2015 1045   ALKPHOS 47 06/21/2014 0406   AST 27 01/10/2015 1045   AST 31 06/21/2014 0406   ALT 31 01/10/2015 1045   ALT 29 06/21/2014 0406   BILITOT 0.60 01/10/2015 1045   BILITOT <0.2* 06/21/2014 0406       RADIOGRAPHIC STUDIES: No results found.  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 He completed treatment with subcutaneous Velcade and Decadron status post 11 weekly doses. This was discontinued secondary to disease progression. The patient was started on treatment with weekly subcutaneous Velcade, Revlimid for 21 days every 4 weeks in addition to Decadron 40 mg weekly status post 3 cycles and he is tolerating it fairly well. He is currently undergoing cycle #4. Patient was discussed with and also seen by Dr. Julien Nordmann. I will have him return to lab today to obtain restaging protein studies to reevaluate his disease. He will continue with his course of Revlimid, Velcade and dexamethasone. He will follow-up in 3 weeks for reevaluation. He'll follow-up with his dermatologist as previously scheduled. We will adjust his treatment schedule to allow him to attend his [facility] in August. He will continue to elevate his legs and wear compression stocking for his bilateral lower extremity edema. He will also continue to use Aquaphor for topical emollient. I recommended he use his sennakot BID and miralax daily so long as his stool aren't runny to prevent constipation. He will continue to increase his water intake. The labs were reviewed in  detail and were entirely stable. He will proceed with velcade as planned today and weekly on Wednesdays.    Pal understands and agrees with this plan. He knows the goal of treatment in his case is cure. He has been encouraged to call with any issues that might arise before his next visit here.  Carlton Adam, PA-C 01/10/2015   ADDENDUM: Hematology/Oncology Attending: I had a face to face encounter with the patient today. I recommended his care plan. This is a very pleasant 81 years old white male diagnosed with multiple myeloma and currently undergoing treatment with weekly subcutaneous Velcade, Revlimid and Decadron status post 3 cycles and tolerating his treatment fairly well. The patient recently completed a course of palliative radiotherapy to the lytic lesions  in the right shoulder area under the care of Dr. Lisbeth Renshaw and he is feeling much better with less pain. I recommended for the patient to have repeat myeloma panel performed today for evaluation of his disease. The patient would come back for follow-up visit in 3 weeks for reevaluation and management of any adverse effect of his treatment. He was advised to call immediately if he has any concerning symptoms in the interval.  Disclaimer: This note was dictated with voice recognition software. Similar sounding words can inadvertently be transcribed and may be missed upon review. Eilleen Kempf., MD 01/10/2015

## 2015-01-10 NOTE — Patient Instructions (Addendum)
Continue labs and chemotherapy as scheduled Follow-up in 3 weeks for reevaluation

## 2015-01-11 DIAGNOSIS — L821 Other seborrheic keratosis: Secondary | ICD-10-CM | POA: Diagnosis not present

## 2015-01-11 DIAGNOSIS — I8311 Varicose veins of right lower extremity with inflammation: Secondary | ICD-10-CM | POA: Diagnosis not present

## 2015-01-11 DIAGNOSIS — I872 Venous insufficiency (chronic) (peripheral): Secondary | ICD-10-CM | POA: Diagnosis not present

## 2015-01-11 DIAGNOSIS — L565 Disseminated superficial actinic porokeratosis (DSAP): Secondary | ICD-10-CM | POA: Diagnosis not present

## 2015-01-11 DIAGNOSIS — L570 Actinic keratosis: Secondary | ICD-10-CM | POA: Diagnosis not present

## 2015-01-11 DIAGNOSIS — C44729 Squamous cell carcinoma of skin of left lower limb, including hip: Secondary | ICD-10-CM | POA: Diagnosis not present

## 2015-01-11 DIAGNOSIS — Z85828 Personal history of other malignant neoplasm of skin: Secondary | ICD-10-CM | POA: Diagnosis not present

## 2015-01-11 DIAGNOSIS — I8312 Varicose veins of left lower extremity with inflammation: Secondary | ICD-10-CM | POA: Diagnosis not present

## 2015-01-12 ENCOUNTER — Other Ambulatory Visit: Payer: Self-pay | Admitting: Geriatric Medicine

## 2015-01-12 DIAGNOSIS — G909 Disorder of the autonomic nervous system, unspecified: Secondary | ICD-10-CM

## 2015-01-12 LAB — KAPPA/LAMBDA LIGHT CHAINS
KAPPA LAMBDA RATIO: 1.07 (ref 0.26–1.65)
Kappa free light chain: 0.94 mg/dL (ref 0.33–1.94)
Lambda Free Lght Chn: 0.88 mg/dL (ref 0.57–2.63)

## 2015-01-12 LAB — IGG, IGA, IGM
IGA: 55 mg/dL — AB (ref 68–379)
IGG (IMMUNOGLOBIN G), SERUM: 543 mg/dL — AB (ref 650–1600)
IGM, SERUM: 24 mg/dL — AB (ref 41–251)

## 2015-01-12 LAB — BETA 2 MICROGLOBULIN, SERUM: Beta-2 Microglobulin: 2.86 mg/L — ABNORMAL HIGH (ref <=2.51)

## 2015-01-16 ENCOUNTER — Encounter: Payer: Self-pay | Admitting: Radiation Oncology

## 2015-01-17 ENCOUNTER — Other Ambulatory Visit (HOSPITAL_BASED_OUTPATIENT_CLINIC_OR_DEPARTMENT_OTHER): Payer: Medicare Other

## 2015-01-17 ENCOUNTER — Ambulatory Visit (HOSPITAL_BASED_OUTPATIENT_CLINIC_OR_DEPARTMENT_OTHER): Payer: Medicare Other

## 2015-01-17 ENCOUNTER — Other Ambulatory Visit: Payer: Self-pay | Admitting: Medical Oncology

## 2015-01-17 DIAGNOSIS — Z5112 Encounter for antineoplastic immunotherapy: Secondary | ICD-10-CM | POA: Diagnosis present

## 2015-01-17 DIAGNOSIS — IMO0001 Reserved for inherently not codable concepts without codable children: Secondary | ICD-10-CM

## 2015-01-17 DIAGNOSIS — C9000 Multiple myeloma not having achieved remission: Secondary | ICD-10-CM | POA: Diagnosis present

## 2015-01-17 DIAGNOSIS — C419 Malignant neoplasm of bone and articular cartilage, unspecified: Secondary | ICD-10-CM

## 2015-01-17 LAB — COMPREHENSIVE METABOLIC PANEL (CC13)
ALT: 21 U/L (ref 0–55)
AST: 18 U/L (ref 5–34)
Albumin: 3.4 g/dL — ABNORMAL LOW (ref 3.5–5.0)
Alkaline Phosphatase: 70 U/L (ref 40–150)
Anion Gap: 7 mEq/L (ref 3–11)
BUN: 17 mg/dL (ref 7.0–26.0)
CALCIUM: 8.9 mg/dL (ref 8.4–10.4)
CO2: 31 mEq/L — ABNORMAL HIGH (ref 22–29)
Chloride: 99 mEq/L (ref 98–109)
Creatinine: 1 mg/dL (ref 0.7–1.3)
EGFR: 71 mL/min/{1.73_m2} — AB (ref >90)
Glucose: 95 mg/dl (ref 70–140)
Potassium: 4.1 mEq/L (ref 3.5–5.1)
Sodium: 138 mEq/L (ref 136–145)
Total Bilirubin: 0.74 mg/dL (ref 0.20–1.20)
Total Protein: 5.7 g/dL — ABNORMAL LOW (ref 6.4–8.3)

## 2015-01-17 LAB — CBC WITH DIFFERENTIAL/PLATELET
BASO%: 0.3 % (ref 0.0–2.0)
BASOS ABS: 0 10*3/uL (ref 0.0–0.1)
EOS%: 5.3 % (ref 0.0–7.0)
Eosinophils Absolute: 0.2 10*3/uL (ref 0.0–0.5)
HCT: 36.5 % — ABNORMAL LOW (ref 38.4–49.9)
HEMOGLOBIN: 12.3 g/dL — AB (ref 13.0–17.1)
LYMPH%: 7.5 % — ABNORMAL LOW (ref 14.0–49.0)
MCH: 31.8 pg (ref 27.2–33.4)
MCHC: 33.7 g/dL (ref 32.0–36.0)
MCV: 94.3 fL (ref 79.3–98.0)
MONO#: 0.2 10*3/uL (ref 0.1–0.9)
MONO%: 4.5 % (ref 0.0–14.0)
NEUT%: 82.4 % — ABNORMAL HIGH (ref 39.0–75.0)
NEUTROS ABS: 3.3 10*3/uL (ref 1.5–6.5)
PLATELETS: 87 10*3/uL — AB (ref 140–400)
RBC: 3.87 10*6/uL — AB (ref 4.20–5.82)
RDW: 15.6 % — AB (ref 11.0–14.6)
WBC: 4 10*3/uL (ref 4.0–10.3)
lymph#: 0.3 10*3/uL — ABNORMAL LOW (ref 0.9–3.3)

## 2015-01-17 MED ORDER — BORTEZOMIB CHEMO SQ INJECTION 3.5 MG (2.5MG/ML)
1.3000 mg/m2 | Freq: Once | INTRAMUSCULAR | Status: AC
  Administered 2015-01-17: 2.5 mg via SUBCUTANEOUS
  Filled 2015-01-17: qty 2.5

## 2015-01-17 MED ORDER — ONDANSETRON HCL 8 MG PO TABS
ORAL_TABLET | ORAL | Status: AC
  Filled 2015-01-17: qty 1

## 2015-01-17 MED ORDER — ONDANSETRON HCL 8 MG PO TABS
8.0000 mg | ORAL_TABLET | Freq: Once | ORAL | Status: AC
  Administered 2015-01-17: 8 mg via ORAL

## 2015-01-17 NOTE — Progress Notes (Signed)
Per Dr. Julien Nordmann- ok to treat with platelets of 87.

## 2015-01-17 NOTE — Patient Instructions (Signed)
[facility] Discharge Instructions for Patients Receiving Chemotherapy  Today you received the following chemotherapy agents Velcade  To help prevent nausea and vomiting after your treatment, we encourage you to take your nausea medication    If you develop nausea and vomiting that is not controlled by your nausea medication, call the clinic.   BELOW ARE SYMPTOMS THAT SHOULD BE REPORTED IMMEDIATELY:  *FEVER GREATER THAN 100.5 F  *CHILLS WITH OR WITHOUT FEVER  NAUSEA AND VOMITING THAT IS NOT CONTROLLED WITH YOUR NAUSEA MEDICATION  *UNUSUAL SHORTNESS OF BREATH  *UNUSUAL BRUISING OR BLEEDING  TENDERNESS IN MOUTH AND THROAT WITH OR WITHOUT PRESENCE OF ULCERS  *URINARY PROBLEMS  *BOWEL PROBLEMS  UNUSUAL RASH Items with * indicate a potential emergency and should be followed up as soon as possible.  Feel free to call the clinic you have any questions or concerns. The clinic phone number is [phone].  Please show the CHEMO ALERT CARD at check-in to the Emergency Department and triage nurse.   

## 2015-01-24 ENCOUNTER — Ambulatory Visit (HOSPITAL_BASED_OUTPATIENT_CLINIC_OR_DEPARTMENT_OTHER): Payer: Medicare Other

## 2015-01-24 ENCOUNTER — Other Ambulatory Visit: Payer: Self-pay | Admitting: *Deleted

## 2015-01-24 ENCOUNTER — Ambulatory Visit
Admission: RE | Admit: 2015-01-24 | Discharge: 2015-01-24 | Disposition: A | Discharge status: Home / Self Care | Payer: Medicare Other | Source: Ambulatory Visit | Attending: Radiation Oncology | Admitting: Radiation Oncology

## 2015-01-24 ENCOUNTER — Other Ambulatory Visit (HOSPITAL_BASED_OUTPATIENT_CLINIC_OR_DEPARTMENT_OTHER): Payer: Medicare Other

## 2015-01-24 ENCOUNTER — Encounter: Payer: Self-pay | Admitting: Radiation Oncology

## 2015-01-24 ENCOUNTER — Ambulatory Visit: Payer: Medicare Other | Admitting: Nutrition

## 2015-01-24 VITALS — BP 123/50 | HR 72 | Temp 98.6°F | Resp 20

## 2015-01-24 VITALS — BP 116/56 | HR 71 | Temp 97.5°F | Resp 20 | Ht 68.0 in | Wt 160.0 lb

## 2015-01-24 DIAGNOSIS — C9000 Multiple myeloma not having achieved remission: Secondary | ICD-10-CM

## 2015-01-24 DIAGNOSIS — C7951 Secondary malignant neoplasm of bone: Secondary | ICD-10-CM | POA: Diagnosis not present

## 2015-01-24 DIAGNOSIS — C419 Malignant neoplasm of bone and articular cartilage, unspecified: Secondary | ICD-10-CM

## 2015-01-24 DIAGNOSIS — Z5112 Encounter for antineoplastic immunotherapy: Secondary | ICD-10-CM | POA: Diagnosis present

## 2015-01-24 LAB — COMPREHENSIVE METABOLIC PANEL (CC13)
ALT: 23 U/L (ref 0–55)
ANION GAP: 7 meq/L (ref 3–11)
AST: 20 U/L (ref 5–34)
Albumin: 3.6 g/dL (ref 3.5–5.0)
Alkaline Phosphatase: 66 U/L (ref 40–150)
BILIRUBIN TOTAL: 0.69 mg/dL (ref 0.20–1.20)
BUN: 17.1 mg/dL (ref 7.0–26.0)
CO2: 31 meq/L — AB (ref 22–29)
CREATININE: 0.9 mg/dL (ref 0.7–1.3)
Calcium: 9.2 mg/dL (ref 8.4–10.4)
Chloride: 100 mEq/L (ref 98–109)
EGFR: 79 mL/min/{1.73_m2} — ABNORMAL LOW (ref >90)
Glucose: 76 mg/dl (ref 70–140)
Potassium: 4.1 mEq/L (ref 3.5–5.1)
Sodium: 138 mEq/L (ref 136–145)
Total Protein: 5.9 g/dL — ABNORMAL LOW (ref 6.4–8.3)

## 2015-01-24 LAB — CBC WITH DIFFERENTIAL/PLATELET
BASO%: 0.3 % (ref 0.0–2.0)
BASOS ABS: 0 10*3/uL (ref 0.0–0.1)
EOS%: 7.5 % — AB (ref 0.0–7.0)
Eosinophils Absolute: 0.3 10*3/uL (ref 0.0–0.5)
HEMATOCRIT: 39.4 % (ref 38.4–49.9)
HEMOGLOBIN: 13 g/dL (ref 13.0–17.1)
LYMPH%: 9.4 % — ABNORMAL LOW (ref 14.0–49.0)
MCH: 31.2 pg (ref 27.2–33.4)
MCHC: 33 g/dL (ref 32.0–36.0)
MCV: 94.7 fL (ref 79.3–98.0)
MONO#: 0.2 10*3/uL (ref 0.1–0.9)
MONO%: 5.4 % (ref 0.0–14.0)
NEUT#: 3.2 10*3/uL (ref 1.5–6.5)
NEUT%: 77.4 % — AB (ref 39.0–75.0)
Platelets: 84 10*3/uL — ABNORMAL LOW (ref 140–400)
RBC: 4.16 10*6/uL — ABNORMAL LOW (ref 4.20–5.82)
RDW: 16.5 % — ABNORMAL HIGH (ref 11.0–14.6)
WBC: 4.1 10*3/uL (ref 4.0–10.3)
lymph#: 0.4 10*3/uL — ABNORMAL LOW (ref 0.9–3.3)

## 2015-01-24 MED ORDER — ONDANSETRON HCL 8 MG PO TABS
8.0000 mg | ORAL_TABLET | Freq: Once | ORAL | Status: AC
  Administered 2015-01-24: 8 mg via ORAL

## 2015-01-24 MED ORDER — ZOLEDRONIC ACID 4 MG/100ML IV SOLN
4.0000 mg | Freq: Once | INTRAVENOUS | Status: AC
  Administered 2015-01-24: 4 mg via INTRAVENOUS
  Filled 2015-01-24: qty 100

## 2015-01-24 MED ORDER — SODIUM CHLORIDE 0.9 % IV SOLN
Freq: Once | INTRAVENOUS | Status: AC
  Administered 2015-01-24: 11:00:00 via INTRAVENOUS

## 2015-01-24 MED ORDER — ONDANSETRON HCL 8 MG PO TABS
ORAL_TABLET | ORAL | Status: AC
  Filled 2015-01-24: qty 1

## 2015-01-24 MED ORDER — BORTEZOMIB CHEMO SQ INJECTION 3.5 MG (2.5MG/ML)
1.3000 mg/m2 | Freq: Once | INTRAMUSCULAR | Status: AC
  Administered 2015-01-24: 2.5 mg via SUBCUTANEOUS
  Filled 2015-01-24: qty 2.5

## 2015-01-24 NOTE — Progress Notes (Signed)
Radiation Oncology         [phone] ________________________________  Name: Jeremiah Owens MRN: [id number]  Date: 01/24/2015  DOB: 09/22/1929  Follow-Up Visit Note  CC: Jeremiah Low, MD  Jeremiah Bears, MD  Diagnosis: Multiple Myeloma  Interval Since Last Radiation:  12/12/2014  Site/dose:   The patient was treated to the right proximal humerus/shoulder/scapula to a dose of 25 gray in 10 fractions using AP PA fields. A complex treatment plan was utilized.  Narrative:  The patient returns today for routine follow-up. The pt has no pain in right shoulder. He stated that it's better and the skin irritation has cleared up, but he c/o left shoulder pain. He states it's due to arthritis and has a limited range of motion with it. He reports a good appetite and uses a walker with a slow and steady gait. He states that his energy level is poor. He went to physical therapy today for 45 minutes for his balance. He states he has intermittent diarrhea or constipation. He also has some back pain.  ALLERGIES:  is allergic to iodine.  Meds: Current Outpatient Prescriptions  Medication Sig Dispense Refill  . acyclovir (ZOVIRAX) 400 MG tablet TAKE 1 TABLET BY MOUTH TWICE A DAY 60 tablet 2  . Calcium Carbonate-Vitamin D (CALTRATE 600+D PO) Take 600 mg by mouth 2 (two) times daily.    . cholecalciferol (VITAMIN D) 1000 UNITS tablet Take 1,000 Units by mouth at bedtime.     Marland Kitchen dexamethasone (DECADRON) 4 MG tablet 10 TAB EVERY WEEK, START WITH CHEMO 40 tablet 4  . furosemide (LASIX) 20 MG tablet Take 1 tablet (20 mg total) by mouth daily. As needed for swelling (Patient taking differently: Take 20 mg by mouth 2 (two) times daily. As needed for swelling) 10 tablet 0  . HYDROCORTISONE, TOPICAL, 2.5 % SOLN Apply 1 application topically daily as needed.     Marland Kitchen lenalidomide (REVLIMID) 25 MG capsule Take 1 capsule (25 mg total) by mouth daily. Take one capsule ( 25 mg) by mouth for 21 days every 4  weeks-12/29/14 Authorization [id number]  adult male 21 capsule 0  . levothyroxine (SYNTHROID, LEVOTHROID) 25 MCG tablet Take 1 tablet (25 mcg total) by mouth daily. 30 tablet 4  . methocarbamol (ROBAXIN) 500 MG tablet TAKE 1 TABLET (500 MG TOTAL) BY MOUTH EVERY 6 (SIX) HOURS AS NEEDED FOR MUSCLE SPASMS. 60 tablet 0  . Multiple Vitamins-Minerals (CENTRUM SILVER PO) Take 1 tablet by mouth daily.    . Polyvinyl Alcohol-Povidone (REFRESH OP) Apply 1 drop to eye daily as needed (dry/irritated eyes).    . potassium chloride (K-DUR,KLOR-CON) 10 MEQ tablet Take 10 mEq by mouth daily.    . sennosides-docusate sodium (SENOKOT-S) 8.6-50 MG tablet Take 1 tablet by mouth. Takes 1-2 tabs daily    . tamsulosin (FLOMAX) 0.4 MG CAPS capsule Take 1 capsule (0.4 mg total) by mouth at bedtime. 30 capsule 3  . warfarin (COUMADIN) 2 MG tablet Take 1 tablet (2 mg total) by mouth daily. 30 tablet 3  . hyaluronate sodium (RADIAPLEXRX) GEL Apply 1 application topically daily. Apply to treated area on skin after rad txs daily and prn    . HYDROcodone-acetaminophen (NORCO) 5-325 MG per tablet Take 1 tablet by mouth every 6 (six) hours as needed for moderate pain. (Patient not taking: Reported on 01/24/2015) 60 tablet 0  . NON FORMULARY Prunes prn    . omeprazole (PRILOSEC) 40 MG capsule Take 40 mg by mouth daily. In the afternoon  11  . prochlorperazine (COMPAZINE) 10 MG tablet Take 1 tablet (10 mg total) by mouth every 6 (six) hours as needed for nausea or vomiting. (Patient not taking: Reported on 01/24/2015) 30 tablet 0  . ranitidine (ZANTAC) 150 MG tablet Take 150 mg by mouth at bedtime.   11   No current facility-administered medications for this encounter.    Physical Findings: The patient is in no acute distress. Patient is alert and oriented.  height is _0  (1.727 m) and weight is 160 lb (72.576 kg). His oral temperature is 97.5 F (36.4 C). His blood pressure is 116/56 and his pulse is 71. His respiration is  20 and oxygen saturation is 100%. .  No significant changes.  Lab Findings: Lab Results  Component Value Date   WBC 4.1 01/24/2015   WBC 4.4 07/04/2014   HGB 13.0 01/24/2015   HGB 10.3* 07/04/2014   HCT 39.4 01/24/2015   HCT 31.0* 07/04/2014   PLT 84* 01/24/2015   PLT 201 07/04/2014    Lab Results  Component Value Date   NA 138 01/17/2015   NA 133* 07/04/2014   K 4.1 01/17/2015   K 4.1 07/04/2014   CHLORIDE 99 01/17/2015   CO2 31* 01/17/2015   CO2 27 07/04/2014   GLUCOSE 95 01/17/2015   GLUCOSE 91 07/04/2014   BUN 17.0 01/17/2015   BUN 18 07/04/2014   CREATININE 1.0 01/17/2015   CREATININE 0.90 07/04/2014   BILITOT 0.74 01/17/2015   BILITOT <0.2* 06/21/2014   ALKPHOS 70 01/17/2015   ALKPHOS 47 06/21/2014   AST 18 01/17/2015   AST 31 06/21/2014   ALT 21 01/17/2015   ALT 29 06/21/2014   PROT 5.7* 01/17/2015   PROT 6.3 06/21/2014   ALBUMIN 3.4* 01/17/2015   ALBUMIN 2.5* 06/21/2014   CALCIUM 8.9 01/17/2015   CALCIUM 8.9 07/04/2014   ANIONGAP 7 01/17/2015   ANIONGAP 9 07/04/2014    Radiographic Findings: No results found.  Impression:  The patient is recovering from the effects of radiation. Good palliative response to radiation treatment of the right shoulder. No current skin irritation in treatment area. No new areas of concern at this time.  Plan:  The pt is to continue chemotherapy with medical oncology and will follow up with radiation oncology on a prn basis.  This document serves as a record of services personally performed by Jeremiah Rudd, MD. It was created on his behalf by Jeremiah Owens, a trained medical scribe. The creation of this record is based on the scribe's personal observations and the provider's statements to them. This document has been checked and approved by the attending provider.     _____________________________________ Jeremiah Owens, M.D., Ph.D.

## 2015-01-24 NOTE — Patient Instructions (Signed)
[facility] Discharge Instructions for Patients Receiving Chemotherapy  Today you received the following chemotherapy agents Velcade, Zometa   To help prevent nausea and vomiting after your treatment, we encourage you to take your nausea medication as directed.    If you develop nausea and vomiting that is not controlled by your nausea medication, call the clinic.   BELOW ARE SYMPTOMS THAT SHOULD BE REPORTED IMMEDIATELY:  *FEVER GREATER THAN 100.5 F  *CHILLS WITH OR WITHOUT FEVER  NAUSEA AND VOMITING THAT IS NOT CONTROLLED WITH YOUR NAUSEA MEDICATION  *UNUSUAL SHORTNESS OF BREATH  *UNUSUAL BRUISING OR BLEEDING  TENDERNESS IN MOUTH AND THROAT WITH OR WITHOUT PRESENCE OF ULCERS  *URINARY PROBLEMS  *BOWEL PROBLEMS  UNUSUAL RASH Items with * indicate a potential emergency and should be followed up as soon as possible.  Feel free to call the clinic you have any questions or concerns. The clinic phone number is [phone].  Please show the [place] at check-in to the Emergency Department and triage nurse.

## 2015-01-24 NOTE — Progress Notes (Signed)
Okay to treat with Platelet count of 84 per DR. Mohammad.

## 2015-01-24 NOTE — Progress Notes (Signed)
Patient was identified to be at risk for malnutrition on the MST secondary to poor appetite and weight loss.  Patient is an 81 year old male diagnosed with multiple myeloma.  He is a patient of Dr. Earlie Server.  Past medical history includes melanoma, prostate cancer, anxiety, thyroid disease and radiation therapy.  Medications include calcium with vitamin D, Decadron, Lasix, Revlimid, Synthroid, multivitamin, Prilosec, Compazine, Zantac, and Coumadin.  Labs were reviewed.  Height: 68 inches. Weight: 160 pounds. Usual body weight: 170 pounds. BMI: 24.33.  Patient reports good appetite. States he's been eating normally.  Reports he drinks 2 bottles of Boost daily. Reports constipation alters with diarrhea but he is managing this nutrition impact symptom.  Nutrition diagnosis: Unintended weight loss related to diagnosis of multiple myeloma as evidenced by 10 pound weight loss from usual body weight.  Intervention: Patient and wife were educated on ways to increase calories and protein.  Fact sheet was provided. Patient was encouraged to consume 3 meals with 2 snacks daily. Recommended patient increase boost to boost plus and drink 1-2 bottles daily.  I provided coupons. Questions were answered.  Teach back method used.  My contact information was provided.  Monitoring, evaluation, goals: Patient was encouraged to consume adequate calories and protein to promote weight maintenance and increased energy.  Next visit: Patient will call me with questions as needed.  **Disclaimer: This note was dictated with voice recognition software. Similar sounding words can inadvertently be transcribed and this note may contain transcription errors which may not have been corrected upon publication of note.**

## 2015-01-24 NOTE — Progress Notes (Signed)
Follow up s/p rad  txs 11/29/14-12/12/14 right shoulder/scapula/proximal humeus, no pain in right shoulder,stated better but c/o left shoulder pain,says arthritis, appetite good, using walker slow steady gair, energy level poor,   Went to physicl therapy today 45 minutes for his balance 10:04 AM BP 116/56 mmHg  Pulse 71  Temp(Src) 97.5 F (36.4 C) (Oral)  Resp 20  Ht 5\' 8"  (1.727 m)  Wt 160 lb (72.576 kg)  BMI 24.33 kg/m2  SpO2 100%  Wt Readings from Last 3 Encounters:  01/24/15 160 lb (72.576 kg)  01/10/15 163 lb 1.6 oz (73.982 kg)  12/20/14 166 lb (75.297 kg)  Gaspar Garbe, RN II Rad/Onc

## 2015-01-29 ENCOUNTER — Other Ambulatory Visit: Payer: Self-pay | Admitting: *Deleted

## 2015-01-29 MED ORDER — WARFARIN SODIUM 2 MG PO TABS: 2.0000 mg | ORAL_TABLET | Freq: Every day | ORAL | Status: DC

## 2015-01-31 ENCOUNTER — Other Ambulatory Visit (HOSPITAL_BASED_OUTPATIENT_CLINIC_OR_DEPARTMENT_OTHER): Payer: Medicare Other | Admitting: *Deleted

## 2015-01-31 ENCOUNTER — Other Ambulatory Visit: Payer: Self-pay | Admitting: Medical Oncology

## 2015-01-31 ENCOUNTER — Other Ambulatory Visit (HOSPITAL_BASED_OUTPATIENT_CLINIC_OR_DEPARTMENT_OTHER): Payer: Medicare Other

## 2015-01-31 ENCOUNTER — Ambulatory Visit (HOSPITAL_BASED_OUTPATIENT_CLINIC_OR_DEPARTMENT_OTHER): Payer: Medicare Other

## 2015-01-31 ENCOUNTER — Encounter: Payer: Self-pay | Admitting: Internal Medicine

## 2015-01-31 ENCOUNTER — Ambulatory Visit (HOSPITAL_BASED_OUTPATIENT_CLINIC_OR_DEPARTMENT_OTHER): Payer: Medicare Other | Admitting: Internal Medicine

## 2015-01-31 VITALS — BP 126/48 | HR 76 | Temp 98.7°F | Resp 18 | Ht 68.0 in | Wt 162.6 lb

## 2015-01-31 DIAGNOSIS — Z5112 Encounter for antineoplastic immunotherapy: Secondary | ICD-10-CM | POA: Diagnosis present

## 2015-01-31 DIAGNOSIS — C9000 Multiple myeloma not having achieved remission: Secondary | ICD-10-CM | POA: Diagnosis not present

## 2015-01-31 DIAGNOSIS — Z5111 Encounter for antineoplastic chemotherapy: Secondary | ICD-10-CM

## 2015-01-31 HISTORY — DX: Encounter for antineoplastic chemotherapy: Z51.11

## 2015-01-31 LAB — CBC WITH DIFFERENTIAL/PLATELET
BASO%: 0.5 % (ref 0.0–2.0)
BASOS ABS: 0 10*3/uL (ref 0.0–0.1)
EOS%: 16.1 % — ABNORMAL HIGH (ref 0.0–7.0)
Eosinophils Absolute: 0.4 10*3/uL (ref 0.0–0.5)
HCT: 35.8 % — ABNORMAL LOW (ref 38.4–49.9)
HGB: 11.9 g/dL — ABNORMAL LOW (ref 13.0–17.1)
LYMPH#: 0.3 10*3/uL — AB (ref 0.9–3.3)
LYMPH%: 12.1 % — ABNORMAL LOW (ref 14.0–49.0)
MCH: 31.5 pg (ref 27.2–33.4)
MCHC: 33.2 g/dL (ref 32.0–36.0)
MCV: 95.1 fL (ref 79.3–98.0)
MONO#: 0.3 10*3/uL (ref 0.1–0.9)
MONO%: 9.5 % (ref 0.0–14.0)
NEUT#: 1.7 10*3/uL (ref 1.5–6.5)
NEUT%: 61.8 % (ref 39.0–75.0)
Platelets: 106 10*3/uL — ABNORMAL LOW (ref 140–400)
RBC: 3.77 10*6/uL — ABNORMAL LOW (ref 4.20–5.82)
RDW: 17.1 % — ABNORMAL HIGH (ref 11.0–14.6)
WBC: 2.8 10*3/uL — ABNORMAL LOW (ref 4.0–10.3)

## 2015-01-31 LAB — BASIC METABOLIC PANEL (CC13)
ANION GAP: 10 meq/L (ref 3–11)
BUN: 17.4 mg/dL (ref 7.0–26.0)
CO2: 28 mEq/L (ref 22–29)
Calcium: 8.6 mg/dL (ref 8.4–10.4)
Chloride: 101 mEq/L (ref 98–109)
Creatinine: 0.9 mg/dL (ref 0.7–1.3)
EGFR: 76 mL/min/{1.73_m2} — ABNORMAL LOW (ref >90)
GLUCOSE: 89 mg/dL (ref 70–140)
POTASSIUM: 4 meq/L (ref 3.5–5.1)
SODIUM: 139 meq/L (ref 136–145)

## 2015-01-31 MED ORDER — ONDANSETRON HCL 8 MG PO TABS
8.0000 mg | ORAL_TABLET | Freq: Once | ORAL | Status: AC
  Administered 2015-01-31: 8 mg via ORAL

## 2015-01-31 MED ORDER — BORTEZOMIB CHEMO SQ INJECTION 3.5 MG (2.5MG/ML)
1.3000 mg/m2 | Freq: Once | INTRAMUSCULAR | Status: AC
  Administered 2015-01-31: 2.5 mg via SUBCUTANEOUS
  Filled 2015-01-31: qty 2.5

## 2015-01-31 MED ORDER — LENALIDOMIDE 25 MG PO CAPS: 25.0000 mg | ORAL_CAPSULE | Freq: Every day | ORAL | Status: DC

## 2015-01-31 MED ORDER — ONDANSETRON HCL 8 MG PO TABS
ORAL_TABLET | ORAL | Status: AC
  Filled 2015-01-31: qty 1

## 2015-01-31 NOTE — Patient Instructions (Signed)
Bortezomib injection What is this medicine? BORTEZOMIB (bor TEZ oh mib) is a chemotherapy drug. It slows the growth of cancer cells. This medicine is used to treat multiple myeloma, and certain lymphomas, such as mantle-cell lymphoma. This medicine may be used for other purposes; ask your health care provider or pharmacist if you have questions. COMMON BRAND NAME(S): Velcade What should I tell my health care provider before I take this medicine? They need to know if you have any of these conditions: -diabetes -heart disease -irregular heartbeat -liver disease -on hemodialysis -low blood counts, like low white blood cells, platelets, or hemoglobin -peripheral neuropathy -taking medicine for blood pressure -an unusual or allergic reaction to bortezomib, mannitol, boron, other medicines, foods, dyes, or preservatives -pregnant or trying to get pregnant -breast-feeding How should I use this medicine? This medicine is for injection into a vein or for injection under the skin. It is given by a health care professional in a hospital or clinic setting. Talk to your pediatrician regarding the use of this medicine in children. Special care may be needed. Overdosage: If you think you have taken too much of this medicine contact a poison control center or emergency room at once. NOTE: This medicine is only for you. Do not share this medicine with others. What if I miss a dose? It is important not to miss your dose. Call your doctor or health care professional if you are unable to keep an appointment. What may interact with this medicine? This medicine may interact with the following medications: -ketoconazole -rifampin -ritonavir -St. John's Wort This list may not describe all possible interactions. Give your health care provider a list of all the medicines, herbs, non-prescription drugs, or dietary supplements you use. Also tell them if you smoke, drink alcohol, or use illegal drugs. Some items  may interact with your medicine. What should I watch for while using this medicine? Visit your doctor for checks on your progress. This drug may make you feel generally unwell. This is not uncommon, as chemotherapy can affect healthy cells as well as cancer cells. Report any side effects. Continue your course of treatment even though you feel ill unless your doctor tells you to stop. You may get drowsy or dizzy. Do not drive, use machinery, or do anything that needs mental alertness until you know how this medicine affects you. Do not stand or sit up quickly, especially if you are an older patient. This reduces the risk of dizzy or fainting spells. In some cases, you may be given additional medicines to help with side effects. Follow all directions for their use. Call your doctor or health care professional for advice if you get a fever, chills or sore throat, or other symptoms of a cold or flu. Do not treat yourself. This drug decreases your body's ability to fight infections. Try to avoid being around people who are sick. This medicine may increase your risk to bruise or bleed. Call your doctor or health care professional if you notice any unusual bleeding. You may need blood work done while you are taking this medicine. In some patients, this medicine may cause a serious brain infection that may cause death. If you have any problems seeing, thinking, speaking, walking, or standing, tell your doctor right away. If you cannot reach your doctor, urgently seek other source of medical care. Do not become pregnant while taking this medicine. Women should inform their doctor if they wish to become pregnant or think they might be pregnant. There is   a potential for serious side effects to an unborn child. Talk to your health care professional or pharmacist for more information. Do not breast-feed an infant while taking this medicine. Check with your doctor or health care professional if you get an attack of  severe diarrhea, nausea and vomiting, or if you sweat a lot. The loss of too much body fluid can make it dangerous for you to take this medicine. What side effects may I notice from receiving this medicine? Side effects that you should report to your doctor or health care professional as soon as possible: -allergic reactions like skin rash, itching or hives, swelling of the face, lips, or tongue -breathing problems -changes in hearing -changes in vision -fast, irregular heartbeat -feeling faint or lightheaded, falls -pain, tingling, numbness in the hands or feet -right upper belly pain -seizures -swelling of the ankles, feet, hands -unusual bleeding or bruising -unusually weak or tired -vomiting -yellowing of the eyes or skin Side effects that usually do not require medical attention (report to your doctor or health care professional if they continue or are bothersome): -changes in emotions or moods -constipation -diarrhea -loss of appetite -headache -irritation at site where injected -nausea This list may not describe all possible side effects. Call your doctor for medical advice about side effects. You may report side effects to FDA at 1-800-FDA-1088. Where should I keep my medicine? This drug is given in a hospital or clinic and will not be stored at home. NOTE: This sheet is a summary. It may not cover all possible information. If you have questions about this medicine, talk to your doctor, pharmacist, or health care provider.  2015, Elsevier/Gold Standard. (2013-05-16 12:46:32)  

## 2015-01-31 NOTE — Progress Notes (Signed)
OK to tx with CBC from 01/24/15 per Dr. Earlie Server.

## 2015-01-31 NOTE — Progress Notes (Signed)
[facility] [phone]   Fax:([phone]) [place] [address] [place] [address]  DIAGNOSIS: Multiple myeloma diagnosed in November 2015  PRIOR THERAPY:  1) Status post Thoracic five laminectomy for epidural tumor resection, Thoracic 4-7 posterior lateral arthrodesis,  segmental pedicle screw fixation T4-T7 Globus instrumentation under the care of Dr. Christella Noa on 06/18/2014. 2) Systemic chemotherapy with Velcade 1.3 MG/M2 subcutaneously weekly in addition to Decadron 40 mg by mouth on weekly basis. Status post 11 cycles. 3) palliative radiotherapy to the right proximal humerus, shoulder and scapula under the care of Dr. Lisbeth Renshaw completed on 12/12/2014.  CURRENT THERAPY:  1) Systemic chemotherapy with Velcade 1.3 MG/M2 subcutaneously weekly, Decadron 40 mg by mouth weekly in addition to Revlimid 25 MG by mouth daily for 21 days every 4 weeks, status post 4 cycle. 2) Zometa 4 mg IV every month. First dose 11/01/2014.  INTERVAL HISTORY: Jeremiah Owens 81 y.o. male returns to the clinic today for follow-up visit accompanied by his wife. The patient is currently on treatment with weekly subcutaneous Velcade, Revlimid and Decadron status post 4 cycle and tolerating the treatment fairly well. He also recently completed a course of palliative radiotherapy to the progressive disease at the right proximal humerus, shoulder and scapula with significant improvement in his pain. He denied having any significant nausea or vomiting, no fever or chills. He denied having any significant weight loss or night sweats. He denied having any significant chest pain, shortness of breath, cough or hemoptysis. He had repeat myeloma panel performed recently and he is here for evaluation and discussion of his lab results.  MEDICAL HISTORY: Past Medical History  Diagnosis Date  . Compression fracture   . Melanoma   . Bone cancer    t_spine T-5  . Prostate cancer 2002  . Skin cancer 1994    melanoma  right neck   . Multiple myeloma   . Allergy   . Anxiety   . Thyroid disease   . S/P radiation therapy 08/21/14-09/04/14    T4-6 25Gy/77f  . S/P radiation therapy 11/29/14-12/12/14    rt prox humerus/shoulder/scapula 25Gy/163f   ALLERGIES:  is allergic to iodine.  MEDICATIONS:  Current Outpatient Prescriptions  Medication Sig Dispense Refill  . acyclovir (ZOVIRAX) 400 MG tablet TAKE 1 TABLET BY MOUTH TWICE A DAY 60 tablet 2  . Calcium Carbonate-Vitamin D (CALTRATE 600+D PO) Take 600 mg by mouth 2 (two) times daily.    . cholecalciferol (VITAMIN D) 1000 UNITS tablet Take 1,000 Units by mouth at bedtime.     . Marland Kitchenexamethasone (DECADRON) 4 MG tablet 10 TAB EVERY WEEK, START WITH CHEMO 40 tablet 4  . furosemide (LASIX) 20 MG tablet Take 1 tablet (20 mg total) by mouth daily. As needed for swelling (Patient taking differently: Take 20 mg by mouth 2 (two) times daily. As needed for swelling) 10 tablet 0  . hyaluronate sodium (RADIAPLEXRX) GEL Apply 1 application topically daily. Apply to treated area on skin after rad txs daily and prn    . HYDROCORTISONE, TOPICAL, 2.5 % SOLN Apply 1 application topically daily as needed.     . Marland Kitchenenalidomide (REVLIMID) 25 MG capsule Take 1 capsule (25 mg total) by mouth daily. Take one capsule ( 25 mg) by mouth for 21 days every 4 weeks-01/31/15 Authorization number=[id number]  adult male 21 capsule 0  . levothyroxine (SYNTHROID, LEVOTHROID) 25 MCG tablet Take 1 tablet (25 mcg  total) by mouth daily. 30 tablet 4  . methocarbamol (ROBAXIN) 500 MG tablet TAKE 1 TABLET (500 MG TOTAL) BY MOUTH EVERY 6 (SIX) HOURS AS NEEDED FOR MUSCLE SPASMS. 60 tablet 0  . Multiple Vitamins-Minerals (CENTRUM SILVER PO) Take 1 tablet by mouth daily.    . NON FORMULARY Prunes prn    . Polyvinyl Alcohol-Povidone (REFRESH OP) Apply 1 drop to eye daily as needed (dry/irritated eyes).    . potassium chloride (K-DUR,KLOR-CON) 10  MEQ tablet Take 10 mEq by mouth daily.    . ranitidine (ZANTAC) 150 MG tablet Take 150 mg by mouth at bedtime.   11  . sennosides-docusate sodium (SENOKOT-S) 8.6-50 MG tablet Take 1 tablet by mouth. Takes 1-2 tabs daily    . tamsulosin (FLOMAX) 0.4 MG CAPS capsule Take 1 capsule (0.4 mg total) by mouth at bedtime. 30 capsule 3  . warfarin (COUMADIN) 2 MG tablet Take 1 tablet (2 mg total) by mouth daily. 30 tablet 3  . HYDROcodone-acetaminophen (NORCO) 5-325 MG per tablet Take 1 tablet by mouth every 6 (six) hours as needed for moderate pain. (Patient not taking: Reported on 01/24/2015) 60 tablet 0  . omeprazole (PRILOSEC) 40 MG capsule Take 40 mg by mouth daily. In the afternoon  11  . prochlorperazine (COMPAZINE) 10 MG tablet Take 1 tablet (10 mg total) by mouth every 6 (six) hours as needed for nausea or vomiting. (Patient not taking: Reported on 01/24/2015) 30 tablet 0   No current facility-administered medications for this visit.    SURGICAL HISTORY:  Past Surgical History  Procedure Laterality Date  . Hernia repair    . Posterior cervical fusion/foraminotomy N/A 06/17/2014    Procedure: Thoracic five laminectomy for epidural tumor resection Thoracic 4-7 posterior lateral arthrodesis, segmental pedicle screw fixation.;  Surgeon: Ashok Pall, MD;  Location: [facility];  Service: Neurosurgery;  Laterality: N/A;    REVIEW OF SYSTEMS:  Constitutional: positive for fatigue Eyes: negative Ears, nose, mouth, throat, and face: negative Respiratory: negative Cardiovascular: negative Gastrointestinal: negative Genitourinary:negative Integument/breast: negative Hematologic/lymphatic: negative Musculoskeletal:positive for bone pain and muscle weakness Neurological: negative Behavioral/Psych: negative Endocrine: negative Allergic/Immunologic: negative   PHYSICAL EXAMINATION: General appearance: alert, cooperative, fatigued and no distress Head: Normocephalic, without obvious abnormality,  atraumatic Neck: no adenopathy, no JVD, supple, symmetrical, trachea midline and thyroid not enlarged, symmetric, no tenderness/mass/nodules Lymph nodes: Cervical, supraclavicular, and axillary nodes normal. Resp: clear to auscultation bilaterally Back: symmetric, no curvature. ROM normal. No CVA tenderness. Cardio: regular rate and rhythm, S1, S2 normal, no murmur, click, rub or gallop GI: soft, non-tender; bowel sounds normal; no masses,  no organomegaly Extremities: edema 2+ edema bilaterally with erythema on the legs Neurologic: Alert and oriented X 3, normal strength and tone. Normal symmetric reflexes. Normal coordination and gait Motor: grossly normal Weakness in the left foot  ECOG PERFORMANCE STATUS: 1 - Symptomatic but completely ambulatory  Blood pressure 126/48, pulse 76, temperature 98.7 F (37.1 C), temperature source Oral, resp. rate 18, height _0  (1.727 m), weight 162 lb 9.6 oz (73.755 kg), SpO2 99 %.  LABORATORY DATA: Lab Results  Component Value Date   WBC 4.1 01/24/2015   HGB 13.0 01/24/2015   HCT 39.4 01/24/2015   MCV 94.7 01/24/2015   PLT 84* 01/24/2015      Chemistry      Component Value Date/Time   NA 139 01/31/2015 0929   NA 133* 07/04/2014 0430   K 4.0 01/31/2015 0929   K 4.1 07/04/2014 0430  CL 97 07/04/2014 0430   CO2 28 01/31/2015 0929   CO2 27 07/04/2014 0430   BUN 17.4 01/31/2015 0929   BUN 18 07/04/2014 0430   CREATININE 0.9 01/31/2015 0929   CREATININE 0.90 07/04/2014 0430      Component Value Date/Time   CALCIUM 8.6 01/31/2015 0929   CALCIUM 8.9 07/04/2014 0430   ALKPHOS 66 01/24/2015 1024   ALKPHOS 47 06/21/2014 0406   AST 20 01/24/2015 1024   AST 31 06/21/2014 0406   ALT 23 01/24/2015 1024   ALT 29 06/21/2014 0406   BILITOT 0.69 01/24/2015 1024   BILITOT <0.2* 06/21/2014 0406       RADIOGRAPHIC STUDIES: No results found.  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 He completed treatment with subcutaneous Velcade and Decadron status post 11 weekly doses. This was discontinued secondary to disease progression. The patient was started on treatment with weekly subcutaneous Velcade, Revlimid for 21 days every 4 weeks in addition to Decadron 40 mg weekly status post 4 cycle and he is tolerating it fairly well.  The patient myeloma panel showed significant improvement in his disease. I discussed the lab results and give a copy of the report to the patient and his wife. I recommended for him to continue with the same treatment regimen with subcutaneous Velcade, Revlimid and Decadron. He would come back for follow-up visit in one month's for reevaluation and management of any adverse effect of his treatment. He was advised to call immediately if he has any concerning symptoms in the interval. The patient voices understanding of current disease status and treatment options and is in agreement with the current care plan.  All questions were answered. The patient knows to call the clinic with any problems, questions or concerns. We can certainly see the patient much sooner if necessary.  Disclaimer: This note was dictated with voice recognition software. Similar sounding words can inadvertently be transcribed and may not be corrected upon review.

## 2015-02-06 ENCOUNTER — Encounter: Payer: Self-pay | Admitting: Physical Medicine & Rehabilitation

## 2015-02-06 ENCOUNTER — Encounter: Payer: Medicare Other | Attending: Physical Medicine & Rehabilitation | Admitting: Physical Medicine & Rehabilitation

## 2015-02-06 VITALS — BP 126/58 | HR 64 | Resp 16

## 2015-02-06 DIAGNOSIS — D492 Neoplasm of unspecified behavior of bone, soft tissue, and skin: Secondary | ICD-10-CM | POA: Diagnosis not present

## 2015-02-06 DIAGNOSIS — R269 Unspecified abnormalities of gait and mobility: Secondary | ICD-10-CM | POA: Diagnosis not present

## 2015-02-06 DIAGNOSIS — G8220 Paraplegia, unspecified: Secondary | ICD-10-CM | POA: Diagnosis not present

## 2015-02-06 DIAGNOSIS — C9000 Multiple myeloma not having achieved remission: Secondary | ICD-10-CM | POA: Insufficient documentation

## 2015-02-06 NOTE — Progress Notes (Signed)
Subjective:    Patient ID: Jeremiah Owens, male    DOB: 29-Oct-1929, 81 y.o.   MRN: [id number]  HPI   Mr. Hollingsworth is here in follow up of his spinal cord injury/related to his myeloma. He wants to get off his rolling walker. He has been driving without any issues. He goes to the [facility] for balance classes.   He hasn't had any falls since I last saw him. He does feel more inhibited at night time. He occasionally walks around his home without any assistive device but doesn't feel steady.     Pain Inventory Average Pain 0 Pain Right Now 0 My pain is no pain  In the last 24 hours, has pain interfered with the following? General activity 0 Relation with others 0 Enjoyment of life 0 What TIME of day is your pain at its worst? no pain Sleep (in general) no pain  Pain is worse with: no pain Pain improves with: no pain Relief from Meds: no pain  Mobility use a walker ability to climb steps?  no do you drive?  yes  Function retired  Neuro/Psych trouble walking spasms  Prior Studies Any changes since last visit?  no  Physicians involved in your care Any changes since last visit?  no   Family History  Problem Relation Age of Onset  . Cancer Mother     breast   History   Social History  . Marital Status: Married    Spouse Name: N/A  . Number of Children: N/A  . Years of Education: N/A   Social History Main Topics  . Smoking status: Never Smoker   . Smokeless tobacco: Not on file  . Alcohol Use: No  . Drug Use: No  . Sexual Activity: Not on file   Other Topics Concern  . None   Social History Narrative   Past Surgical History  Procedure Laterality Date  . Hernia repair    . Posterior cervical fusion/foraminotomy N/A 06/17/2014    Procedure: Thoracic five laminectomy for epidural tumor resection Thoracic 4-7 posterior lateral arthrodesis, segmental pedicle screw fixation.;  Surgeon: Ashok Pall, MD;  Location: [facility];  Service: Neurosurgery;   Laterality: N/A;   Past Medical History  Diagnosis Date  . Compression fracture   . Melanoma   . Bone cancer     t_spine T-5  . Prostate cancer 2002  . Skin cancer 1994    melanoma  right neck   . Multiple myeloma   . Allergy   . Anxiety   . Thyroid disease   . S/P radiation therapy 08/21/14-09/04/14    T4-6 25Gy/76f  . S/P radiation therapy 11/29/14-12/12/14    rt prox humerus/shoulder/scapula 25Gy/1106f . Encounter for antineoplastic chemotherapy 01/31/2015   BP 126/58 mmHg  Pulse 64  Resp 16  SpO2 97%  Opioid Risk Score:   Fall Risk Score: Moderate Fall Risk (6-13 points) (previously educated and given handout on fall prevention)`1  Depression screen PHQ 2/9  Depression screen PHQ 2/9 11/07/2014  Decreased Interest 0  Down, Depressed, Hopeless 0  PHQ - 2 Score 0  Altered sleeping 0  Tired, decreased energy 2  Change in appetite 0  Feeling bad or failure about yourself  0  Trouble concentrating 1  Moving slowly or fidgety/restless 0  Suicidal thoughts 0  PHQ-9 Score 3      Review of Systems  Gastrointestinal: Positive for constipation.  Musculoskeletal: Positive for gait problem.  Spasms  All other systems reviewed and are negative.      Objective:   Physical Exam   HENT: dentition good  Head: Normocephalic.  Right Ear: External ear normal.  Left Ear: External ear normal.  Eyes: Conjunctivae and EOM are normal. Pupils are equal, round, and reactive to light. Right eye exhibits no discharge. Left eye exhibits no discharge.  Neck: Normal range of motion. Neck supple. No JVD present. No tracheal deviation present. No thyromegaly present.  Cardiovascular: Normal rate, regular rhythm and normal heart sounds.  Respiratory: Effort normal and breath sounds normal. No respiratory distress. He has no wheezes. He has no rales. He exhibits no tenderness.  GI: Soft. Bowel sounds are normal. He exhibits no distension. There is no tenderness. There is no rebound.    Musculoskeletal:  Improved sitting posture.   Lymphadenopathy:  He has no cervical adenopathy.  Neurological: He is alert and oriented to person, place, and time.  Follows commands. No cn findings. Left shoulder limited due to pain/ tightness. Otherwise UE 5/5. LE: HF 4-5. KE 4/5, ADF/APF 4/5. Decreased LT/proprioception in both feet. No resting tone. gait improved. Still with steppage but improved. Does well with walker.  Stands with a kyphotic posture. Skin: Skin is warm.  Back incision is healed  Psychiatric: He has a normal mood and affect. His behavior is normal. Judgment and thought content normal   Assessment/Plan:  1. Functional deficits secondary to multiple myeloma to thoracic spine status post decompression and fusion T4-7 06/18/2014  - will make a referral to [facility] for aquatic/land based PT to see if we can advance him to a cane. I think moving to a cane is quite possible 2. Onc management per Dr. Earlie Server.    3. Pain Management: levels are now 0/10. Tylenol prn    Follow up with me in 3 months. Thirty minutes of face to face patient care time were spent during this visit. All questions were encouraged and answered.  Marland Kitchen

## 2015-02-06 NOTE — Patient Instructions (Signed)
PLEASE CALL ME WITH ANY PROBLEMS OR QUESTIONS (#[phone]).  HAVE A GOOD DAY!    

## 2015-02-07 ENCOUNTER — Ambulatory Visit (HOSPITAL_BASED_OUTPATIENT_CLINIC_OR_DEPARTMENT_OTHER): Payer: Medicare Other

## 2015-02-07 ENCOUNTER — Other Ambulatory Visit (HOSPITAL_BASED_OUTPATIENT_CLINIC_OR_DEPARTMENT_OTHER): Payer: Medicare Other

## 2015-02-07 ENCOUNTER — Other Ambulatory Visit: Payer: Self-pay | Admitting: *Deleted

## 2015-02-07 VITALS — BP 132/59 | HR 72 | Temp 99.0°F | Resp 18

## 2015-02-07 DIAGNOSIS — C9000 Multiple myeloma not having achieved remission: Secondary | ICD-10-CM | POA: Diagnosis present

## 2015-02-07 DIAGNOSIS — Z5112 Encounter for antineoplastic immunotherapy: Secondary | ICD-10-CM | POA: Diagnosis present

## 2015-02-07 LAB — CBC WITH DIFFERENTIAL/PLATELET
BASO%: 0.7 % (ref 0.0–2.0)
Basophils Absolute: 0 10*3/uL (ref 0.0–0.1)
EOS%: 1.5 % (ref 0.0–7.0)
Eosinophils Absolute: 0 10*3/uL (ref 0.0–0.5)
HCT: 36.3 % — ABNORMAL LOW (ref 38.4–49.9)
HGB: 12.3 g/dL — ABNORMAL LOW (ref 13.0–17.1)
LYMPH%: 9.3 % — ABNORMAL LOW (ref 14.0–49.0)
MCH: 32.3 pg (ref 27.2–33.4)
MCHC: 33.9 g/dL (ref 32.0–36.0)
MCV: 95.2 fL (ref 79.3–98.0)
MONO#: 0.1 10*3/uL (ref 0.1–0.9)
MONO%: 4.3 % (ref 0.0–14.0)
NEUT#: 2.6 10*3/uL (ref 1.5–6.5)
NEUT%: 84.2 % — ABNORMAL HIGH (ref 39.0–75.0)
Platelets: 135 10*3/uL — ABNORMAL LOW (ref 140–400)
RBC: 3.82 10*6/uL — ABNORMAL LOW (ref 4.20–5.82)
RDW: 17.1 % — AB (ref 11.0–14.6)
WBC: 3.1 10*3/uL — AB (ref 4.0–10.3)
lymph#: 0.3 10*3/uL — ABNORMAL LOW (ref 0.9–3.3)

## 2015-02-07 LAB — COMPREHENSIVE METABOLIC PANEL (CC13)
ALBUMIN: 3.5 g/dL (ref 3.5–5.0)
ALK PHOS: 63 U/L (ref 40–150)
ALT: 21 U/L (ref 0–55)
AST: 20 U/L (ref 5–34)
Anion Gap: 8 mEq/L (ref 3–11)
BUN: 15.2 mg/dL (ref 7.0–26.0)
CO2: 29 mEq/L (ref 22–29)
CREATININE: 0.9 mg/dL (ref 0.7–1.3)
Calcium: 9.2 mg/dL (ref 8.4–10.4)
Chloride: 103 mEq/L (ref 98–109)
EGFR: 76 mL/min/{1.73_m2} — ABNORMAL LOW (ref >90)
Glucose: 91 mg/dl (ref 70–140)
POTASSIUM: 4.1 meq/L (ref 3.5–5.1)
Sodium: 140 mEq/L (ref 136–145)
Total Bilirubin: 0.62 mg/dL (ref 0.20–1.20)
Total Protein: 5.7 g/dL — ABNORMAL LOW (ref 6.4–8.3)

## 2015-02-07 MED ORDER — BORTEZOMIB CHEMO SQ INJECTION 3.5 MG (2.5MG/ML)
1.3000 mg/m2 | Freq: Once | INTRAMUSCULAR | Status: AC
  Administered 2015-02-07: 2.5 mg via SUBCUTANEOUS
  Filled 2015-02-07: qty 2.5

## 2015-02-07 MED ORDER — ONDANSETRON HCL 8 MG PO TABS
8.0000 mg | ORAL_TABLET | Freq: Once | ORAL | Status: AC
  Administered 2015-02-07: 8 mg via ORAL

## 2015-02-07 MED ORDER — ONDANSETRON HCL 8 MG PO TABS
ORAL_TABLET | ORAL | Status: AC
  Filled 2015-02-07: qty 1

## 2015-02-07 NOTE — Patient Instructions (Signed)
[facility] Discharge Instructions for Patients Receiving Chemotherapy  Today you received the following chemotherapy agents: Velcade  To help prevent nausea and vomiting after your treatment, we encourage you to take your nausea medication as prescribed by your physician.   If you develop nausea and vomiting that is not controlled by your nausea medication, call the clinic.   BELOW ARE SYMPTOMS THAT SHOULD BE REPORTED IMMEDIATELY:  *FEVER GREATER THAN 100.5 F  *CHILLS WITH OR WITHOUT FEVER  NAUSEA AND VOMITING THAT IS NOT CONTROLLED WITH YOUR NAUSEA MEDICATION  *UNUSUAL SHORTNESS OF BREATH  *UNUSUAL BRUISING OR BLEEDING  TENDERNESS IN MOUTH AND THROAT WITH OR WITHOUT PRESENCE OF ULCERS  *URINARY PROBLEMS  *BOWEL PROBLEMS  UNUSUAL RASH Items with * indicate a potential emergency and should be followed up as soon as possible.  Feel free to call the clinic you have any questions or concerns. The clinic phone number is [phone].  Please show the CHEMO ALERT CARD at check-in to the Emergency Department and triage nurse.   

## 2015-02-10 ENCOUNTER — Other Ambulatory Visit: Payer: Self-pay

## 2015-02-10 ENCOUNTER — Emergency Department (HOSPITAL_COMMUNITY): Payer: Medicare Other

## 2015-02-10 ENCOUNTER — Encounter (HOSPITAL_COMMUNITY): Payer: Self-pay | Admitting: Emergency Medicine

## 2015-02-10 ENCOUNTER — Inpatient Hospital Stay (HOSPITAL_COMMUNITY)
Admission: EM | Admit: 2015-02-10 | Discharge: 2015-02-12 | DRG: 064 | Disposition: A | Discharge status: Rehabilitation Facility | Payer: Medicare Other | Attending: Internal Medicine | Admitting: Internal Medicine

## 2015-02-10 DIAGNOSIS — Z7901 Long term (current) use of anticoagulants: Secondary | ICD-10-CM

## 2015-02-10 DIAGNOSIS — C7951 Secondary malignant neoplasm of bone: Secondary | ICD-10-CM | POA: Diagnosis present

## 2015-02-10 DIAGNOSIS — E860 Dehydration: Secondary | ICD-10-CM | POA: Diagnosis present

## 2015-02-10 DIAGNOSIS — R1310 Dysphagia, unspecified: Secondary | ICD-10-CM | POA: Diagnosis present

## 2015-02-10 DIAGNOSIS — R4701 Aphasia: Secondary | ICD-10-CM | POA: Diagnosis not present

## 2015-02-10 DIAGNOSIS — R918 Other nonspecific abnormal finding of lung field: Secondary | ICD-10-CM | POA: Diagnosis not present

## 2015-02-10 DIAGNOSIS — R2689 Other abnormalities of gait and mobility: Secondary | ICD-10-CM

## 2015-02-10 DIAGNOSIS — R531 Weakness: Secondary | ICD-10-CM

## 2015-02-10 DIAGNOSIS — C9000 Multiple myeloma not having achieved remission: Secondary | ICD-10-CM | POA: Diagnosis present

## 2015-02-10 DIAGNOSIS — I639 Cerebral infarction, unspecified: Principal | ICD-10-CM | POA: Diagnosis present

## 2015-02-10 DIAGNOSIS — R42 Dizziness and giddiness: Secondary | ICD-10-CM | POA: Diagnosis not present

## 2015-02-10 DIAGNOSIS — R29898 Other symptoms and signs involving the musculoskeletal system: Secondary | ICD-10-CM | POA: Diagnosis not present

## 2015-02-10 DIAGNOSIS — Z923 Personal history of irradiation: Secondary | ICD-10-CM

## 2015-02-10 DIAGNOSIS — M6281 Muscle weakness (generalized): Secondary | ICD-10-CM | POA: Diagnosis not present

## 2015-02-10 DIAGNOSIS — J690 Pneumonitis due to inhalation of food and vomit: Secondary | ICD-10-CM | POA: Diagnosis present

## 2015-02-10 DIAGNOSIS — Z79899 Other long term (current) drug therapy: Secondary | ICD-10-CM

## 2015-02-10 DIAGNOSIS — E785 Hyperlipidemia, unspecified: Secondary | ICD-10-CM | POA: Diagnosis present

## 2015-02-10 DIAGNOSIS — N400 Enlarged prostate without lower urinary tract symptoms: Secondary | ICD-10-CM | POA: Diagnosis present

## 2015-02-10 DIAGNOSIS — E039 Hypothyroidism, unspecified: Secondary | ICD-10-CM | POA: Diagnosis present

## 2015-02-10 DIAGNOSIS — Z8546 Personal history of malignant neoplasm of prostate: Secondary | ICD-10-CM

## 2015-02-10 DIAGNOSIS — Z981 Arthrodesis status: Secondary | ICD-10-CM

## 2015-02-10 DIAGNOSIS — R479 Unspecified speech disturbances: Secondary | ICD-10-CM | POA: Insufficient documentation

## 2015-02-10 DIAGNOSIS — R269 Unspecified abnormalities of gait and mobility: Secondary | ICD-10-CM

## 2015-02-10 DIAGNOSIS — R4702 Dysphasia: Secondary | ICD-10-CM | POA: Diagnosis not present

## 2015-02-10 DIAGNOSIS — R471 Dysarthria and anarthria: Secondary | ICD-10-CM | POA: Diagnosis present

## 2015-02-10 DIAGNOSIS — G8191 Hemiplegia, unspecified affecting right dominant side: Secondary | ICD-10-CM | POA: Diagnosis present

## 2015-02-10 DIAGNOSIS — Z85820 Personal history of malignant melanoma of skin: Secondary | ICD-10-CM

## 2015-02-10 DIAGNOSIS — R278 Other lack of coordination: Secondary | ICD-10-CM

## 2015-02-10 DIAGNOSIS — R609 Edema, unspecified: Secondary | ICD-10-CM | POA: Diagnosis present

## 2015-02-10 LAB — CBC
HCT: 35.3 % — ABNORMAL LOW (ref 39.0–52.0)
Hemoglobin: 11.8 g/dL — ABNORMAL LOW (ref 13.0–17.0)
MCH: 31.7 pg (ref 26.0–34.0)
MCHC: 33.4 g/dL (ref 30.0–36.0)
MCV: 94.9 fL (ref 78.0–100.0)
PLATELETS: 124 10*3/uL — AB (ref 150–400)
RBC: 3.72 MIL/uL — ABNORMAL LOW (ref 4.22–5.81)
RDW: 16.3 % — ABNORMAL HIGH (ref 11.5–15.5)
WBC: 2.6 10*3/uL — ABNORMAL LOW (ref 4.0–10.5)

## 2015-02-10 LAB — DIFFERENTIAL
BASOS ABS: 0 10*3/uL (ref 0.0–0.1)
BASOS PCT: 1 % (ref 0–1)
EOS ABS: 0.1 10*3/uL (ref 0.0–0.7)
Eosinophils Relative: 6 % — ABNORMAL HIGH (ref 0–5)
Lymphocytes Relative: 18 % (ref 12–46)
Lymphs Abs: 0.5 10*3/uL — ABNORMAL LOW (ref 0.7–4.0)
MONO ABS: 0.4 10*3/uL (ref 0.1–1.0)
MONOS PCT: 14 % — AB (ref 3–12)
NEUTROS PCT: 61 % (ref 43–77)
Neutro Abs: 1.6 10*3/uL — ABNORMAL LOW (ref 1.7–7.7)

## 2015-02-10 LAB — COMPREHENSIVE METABOLIC PANEL
ALBUMIN: 3.5 g/dL (ref 3.5–5.0)
ALK PHOS: 56 U/L (ref 38–126)
ALT: 19 U/L (ref 17–63)
AST: 21 U/L (ref 15–41)
Anion gap: 8 (ref 5–15)
BUN: 25 mg/dL — ABNORMAL HIGH (ref 6–20)
CO2: 27 mmol/L (ref 22–32)
Calcium: 9 mg/dL (ref 8.9–10.3)
Chloride: 103 mmol/L (ref 101–111)
Creatinine, Ser: 1.21 mg/dL (ref 0.61–1.24)
GFR calc Af Amer: >60 mL/min (ref >60)
GFR calc non Af Amer: 54 mL/min — ABNORMAL LOW (ref >60)
Glucose, Bld: 111 mg/dL — ABNORMAL HIGH (ref 65–99)
POTASSIUM: 3.8 mmol/L (ref 3.5–5.1)
Sodium: 138 mmol/L (ref 135–145)
Total Bilirubin: 0.7 mg/dL (ref 0.3–1.2)
Total Protein: 5.9 g/dL — ABNORMAL LOW (ref 6.5–8.1)

## 2015-02-10 LAB — I-STAT TROPONIN, ED: Troponin i, poc: 0 ng/mL (ref 0.00–0.08)

## 2015-02-10 LAB — PROTIME-INR
INR: 1.12 (ref 0.00–1.49)
Prothrombin Time: 14.6 seconds (ref 11.6–15.2)

## 2015-02-10 LAB — CBG MONITORING, ED: Glucose-Capillary: 112 mg/dL — ABNORMAL HIGH (ref 65–99)

## 2015-02-10 NOTE — ED Notes (Signed)
Patient presents to ED with c/o right leg weakness.  Pt's wife reported that pt has chronic right leg weakness but today, pt is unable to lift up leg and walk---- pt "drags" leg to move around.  Pt's speech garbled which "is not normal" per wife.  Pt started on Lenalinamide on Wednesday and then on Friday, pt "has difficulty speaking to his son" which persisted until today.

## 2015-02-10 NOTE — ED Provider Notes (Signed)
CSN: [id number]     Arrival date & time 02/10/15  2142 History   First MD Initiated Contact with Patient 02/10/15 2237     Chief Complaint  Patient presents with  . Extremity Weakness     (Consider location/radiation/quality/duration/timing/severity/associated sxs/prior Treatment) HPI   Pt with hx multiple myeloma with T5 resection and chronic RLE weakness, balance problems, has been improving gradually since his diagnosis November 2015 until 2 days ago.  States three days ago he was fine, that night he began taking Revlamid (this is his 4th cycle, previously only side effect was constipation).  The next day he had difficulty standing and balancing at his balance class.  The next day he was having difficulty with his speech, both with finding and forming words, but not with comprehension or mentation.  He has also had decreased strength in his right leg, has been unable to lift it to put it in the car.  Prior to this he has been doing well and was just seen in the office 4 days and was going to be working from using a walker to a cane.  Denies fevers, chills, CP, SOB, cough or URI symptoms, abdominal pain, back pain or any new bony pain, N/V/D, urinary symptoms.  He has had no problems with bowel or bladder incontinence.    Past Medical History  Diagnosis Date  . Compression fracture   . Melanoma   . Bone cancer     t_spine T-5  . Prostate cancer 2002  . Skin cancer 1994    melanoma  right neck   . Multiple myeloma   . Allergy   . Anxiety   . Thyroid disease   . S/P radiation therapy 08/21/14-09/04/14    T4-6 25Gy/56f  . S/P radiation therapy 11/29/14-12/12/14    rt prox humerus/shoulder/scapula 25Gy/161f . Encounter for antineoplastic chemotherapy 01/31/2015   Past Surgical History  Procedure Laterality Date  . Hernia repair    . Posterior cervical fusion/foraminotomy N/A 06/17/2014    Procedure: Thoracic five laminectomy for epidural tumor resection Thoracic 4-7 posterior lateral  arthrodesis, segmental pedicle screw fixation.;  Surgeon: KyAshok PallMD;  [facility];  Service: Neurosurgery;  Laterality: N/A;   Family History  Problem Relation Age of Onset  . Cancer Mother     breast   History  Substance Use Topics  . Smoking status: Never Smoker   . Smokeless tobacco: Not on file  . Alcohol Use: No    Review of Systems  All other systems reviewed and are negative.     Allergies  Iodine  Home Medications   Prior to Admission medications   Medication Sig Start Date End Date Taking? Authorizing Provider  acyclovir (ZOVIRAX) 400 MG tablet TAKE 1 TABLET BY MOUTH TWICE A DAY 01/05/15   MoCurt BearsMD  Calcium Carbonate-Vitamin D (CALTRATE 600+D PO) Take 600 mg by mouth 2 (two) times daily.    Historical Provider, MD  cholecalciferol (VITAMIN D) 1000 UNITS tablet Take 1,000 Units by mouth at bedtime.     Historical Provider, MD  dexamethasone (DECADRON) 4 MG tablet 10 TAB EVERY WEEK, START WITH CHEMO 12/06/14   MoCurt BearsMD  furosemide (LASIX) 20 MG tablet Take 1 tablet (20 mg total) by mouth daily. As needed for swelling Patient taking differently: Take 20 mg by mouth 2 (two) times daily. As needed for swelling 11/15/14   MoCurt BearsMD  hyaluronate sodium (RADIAPLEXRX) GEL Apply 1 application topically daily. Apply to  treated area on skin after rad txs daily and prn 08/21/14   John Moody, MD  HYDROcodone-acetaminophen (NORCO) 5-325 MG per tablet Take 1 tablet by mouth every 6 (six) hours as needed for moderate pain. 10/11/14   Adrena E Johnson, PA-C  HYDROCORTISONE, TOPICAL, 2.5 % SOLN Apply 1 application topically daily as needed.     Historical Provider, MD  lenalidomide (REVLIMID) 25 MG capsule Take 1 capsule (25 mg total) by mouth daily. Take one capsule ( 25 mg) by mouth for 21 days every 4 weeks-01/31/15 Authorization number=[id number]  adult male 01/31/15   Mohamed Mohamed, MD  levothyroxine (SYNTHROID, LEVOTHROID) 25 MCG tablet Take 1  tablet (25 mcg total) by mouth daily. 07/07/14   Daniel J Angiulli, PA-C  methocarbamol (ROBAXIN) 500 MG tablet TAKE 1 TABLET (500 MG TOTAL) BY MOUTH EVERY 6 (SIX) HOURS AS NEEDED FOR MUSCLE SPASMS. 10/02/14   Zachary T Swartz, MD  Multiple Vitamins-Minerals (CENTRUM SILVER PO) Take 1 tablet by mouth daily.    Historical Provider, MD  NON FORMULARY Prunes prn    Historical Provider, MD  omeprazole (PRILOSEC) 40 MG capsule Take 40 mg by mouth daily. In the afternoon 05/29/14   Historical Provider, MD  Polyvinyl Alcohol-Povidone (REFRESH OP) Apply 1 drop to eye daily as needed (dry/irritated eyes).    Historical Provider, MD  potassium chloride (K-DUR,KLOR-CON) 10 MEQ tablet Take 10 mEq by mouth daily.    Historical Provider, MD  prochlorperazine (COMPAZINE) 10 MG tablet Take 1 tablet (10 mg total) by mouth every 6 (six) hours as needed for nausea or vomiting. 01/10/15   Mohamed Mohamed, MD  ranitidine (ZANTAC) 150 MG tablet Take 150 mg by mouth at bedtime.  05/23/14   Historical Provider, MD  sennosides-docusate sodium (SENOKOT-S) 8.6-50 MG tablet Take 1 tablet by mouth. Takes 1-2 tabs daily    Historical Provider, MD  tamsulosin (FLOMAX) 0.4 MG CAPS capsule Take 1 capsule (0.4 mg total) by mouth at bedtime. 07/07/14   Daniel J Angiulli, PA-C  warfarin (COUMADIN) 2 MG tablet Take 1 tablet (2 mg total) by mouth daily. 01/29/15   Adrena E Johnson, PA-C   BP 127/67 mmHg  Pulse 78  Temp(Src) 98.6 F (37 C) (Oral)  Resp 16  SpO2 98% Physical Exam  Constitutional: He appears well-developed and well-nourished. No distress.  HENT:  Head: Normocephalic and atraumatic.  Neck: Neck supple.  Cardiovascular: Normal rate and regular rhythm.   Pulmonary/Chest: Effort normal and breath sounds normal. No respiratory distress. He has no wheezes. He has no rales.  Abdominal: Soft. He exhibits no distension and no mass. There is no tenderness. There is no rebound and no guarding.  Neurological: He is alert. No  cranial nerve deficit or sensory deficit. He exhibits normal muscle tone. GCS eye subscore is 4. GCS verbal subscore is 5. GCS motor subscore is 6.  Moves all extremities, slight decrease strength in RLE.    CN II-XII intact, EOMs intact, no pronator drift, grip strengths equal bilaterally; sensation intact in all extremities; finger to nose, heel to shin, rapid alternating movements normal.    Pt is able to speak and words are well formed, logical, and he does communicate, but he only speaks a few words at a time and seems to hesitate before speaking.  Repeats 3 words immediately, repeats only 1 at around 1 minute.   Oriented x 4.   Patellar reflex on left intact.  Unable to elicit patellar reflex on right.    Skin: He   is not diaphoretic.  Psychiatric: He has a normal mood and affect. His behavior is normal.  Nursing note and vitals reviewed.   ED Course  Procedures (including critical care time) Labs Review Labs Reviewed  CBC - Abnormal; Notable for the following:    WBC 2.6 (*)    RBC 3.72 (*)    Hemoglobin 11.8 (*)    HCT 35.3 (*)    RDW 16.3 (*)    Platelets 124 (*)    All other components within normal limits  DIFFERENTIAL - Abnormal; Notable for the following:    Neutro Abs 1.6 (*)    Lymphs Abs 0.5 (*)    Monocytes Relative 14 (*)    Eosinophils Relative 6 (*)    All other components within normal limits  COMPREHENSIVE METABOLIC PANEL - Abnormal; Notable for the following:    Glucose, Bld 111 (*)    BUN 25 (*)    Total Protein 5.9 (*)    GFR calc non Af Amer 54 (*)    All other components within normal limits  CBG MONITORING, ED - Abnormal; Notable for the following:    Glucose-Capillary 112 (*)    All other components within normal limits  PROTIME-INR  URINALYSIS, ROUTINE W REFLEX MICROSCOPIC (NOT AT [facility])  CK  I-STAT TROPOININ, ED    Imaging Review Ct Head Wo Contrast  02/11/2015   CLINICAL DATA:  Difficulty with speech with dizziness. Chronic right leg  weakness.  EXAM: CT HEAD WITHOUT CONTRAST  TECHNIQUE: Contiguous axial images were obtained from the base of the skull through the vertex without intravenous contrast.  COMPARISON:  None.  FINDINGS: Skull and Sinuses:Negative for fracture or destructive process. The mastoids, middle ears, and imaged paranasal sinuses are clear.  Orbits: No acute abnormality.  Brain: No evidence of acute gray matter infarction, hemorrhage, hydrocephalus, or mass lesion/mass effect. There is generalized brain atrophy with ventriculomegaly, mild. Chronic small-vessel disease with patchy ischemic gliosis throughout the bilateral cerebral white matter. There is ischemic change in the right putamen, likely chronic based on history.  IMPRESSION: 1. No intracranial hemorrhage or definite infarct. 2. Moderate chronic small vessel disease, which could obscure a recent white matter infarct.   Electronically Signed   By: Monte Fantasia M.D.   On: 02/11/2015 00:20     EKG Interpretation   Date/Time:  Saturday February 10 2015 21:54:11 EDT Ventricular Rate:  74 PR Interval:  197 QRS Duration: 102 QT Interval:  383 QTC Calculation: 425 R Axis:   93 Text Interpretation:  Sinus rhythm Atrial premature complex Right axis  deviation Confirmed by DOCHERTY  MD, MEGAN ([id number]) on 02/10/2015 10:01:01 PM       12:35 AM Discussed pt with Dr Kathrynn Humble who will also see the patient.    MDM   Final diagnoses:  Dizziness  Right leg weakness  Difficulty speaking    Afebrile nontoxic patient with hx multiple myeloma with sudden worsening in his chronic RLE weakness and balance as well as new issues of speech difficulties x 2 days since starting 4th round of medication Revlamid.  He lives at home with wife.  He is able to get around well with his walker currently. Workup to this point negative.  CXR pending.  Dr Kathrynn Humble will also see and examine patient, will assume care of patient and determine any further studies and disposition.       [place], PA-C 02/11/15 0134  Varney Biles, MD 02/11/15 [phone]

## 2015-02-10 NOTE — ED Notes (Signed)
Pt. On monitor. 

## 2015-02-10 NOTE — ED Notes (Signed)
EKG given to EDP,Docherty,MD.,, for review.

## 2015-02-11 ENCOUNTER — Emergency Department (HOSPITAL_COMMUNITY): Payer: Medicare Other

## 2015-02-11 ENCOUNTER — Inpatient Hospital Stay (HOSPITAL_COMMUNITY): Payer: Medicare Other

## 2015-02-11 ENCOUNTER — Encounter (HOSPITAL_COMMUNITY): Payer: Self-pay | Admitting: Emergency Medicine

## 2015-02-11 ENCOUNTER — Observation Stay (HOSPITAL_COMMUNITY): Payer: Medicare Other

## 2015-02-11 DIAGNOSIS — N400 Enlarged prostate without lower urinary tract symptoms: Secondary | ICD-10-CM | POA: Diagnosis present

## 2015-02-11 DIAGNOSIS — Z923 Personal history of irradiation: Secondary | ICD-10-CM | POA: Diagnosis not present

## 2015-02-11 DIAGNOSIS — R471 Dysarthria and anarthria: Secondary | ICD-10-CM | POA: Diagnosis present

## 2015-02-11 DIAGNOSIS — Z981 Arthrodesis status: Secondary | ICD-10-CM | POA: Diagnosis not present

## 2015-02-11 DIAGNOSIS — C9000 Multiple myeloma not having achieved remission: Secondary | ICD-10-CM | POA: Diagnosis not present

## 2015-02-11 DIAGNOSIS — R609 Edema, unspecified: Secondary | ICD-10-CM

## 2015-02-11 DIAGNOSIS — R918 Other nonspecific abnormal finding of lung field: Secondary | ICD-10-CM | POA: Diagnosis not present

## 2015-02-11 DIAGNOSIS — R4701 Aphasia: Secondary | ICD-10-CM | POA: Diagnosis not present

## 2015-02-11 DIAGNOSIS — J690 Pneumonitis due to inhalation of food and vomit: Secondary | ICD-10-CM

## 2015-02-11 DIAGNOSIS — R1310 Dysphagia, unspecified: Secondary | ICD-10-CM | POA: Diagnosis present

## 2015-02-11 DIAGNOSIS — G8191 Hemiplegia, unspecified affecting right dominant side: Secondary | ICD-10-CM | POA: Diagnosis present

## 2015-02-11 DIAGNOSIS — I639 Cerebral infarction, unspecified: Secondary | ICD-10-CM

## 2015-02-11 DIAGNOSIS — R29898 Other symptoms and signs involving the musculoskeletal system: Secondary | ICD-10-CM | POA: Diagnosis not present

## 2015-02-11 DIAGNOSIS — Z8546 Personal history of malignant neoplasm of prostate: Secondary | ICD-10-CM | POA: Diagnosis not present

## 2015-02-11 DIAGNOSIS — I2510 Atherosclerotic heart disease of native coronary artery without angina pectoris: Secondary | ICD-10-CM | POA: Diagnosis not present

## 2015-02-11 DIAGNOSIS — R479 Unspecified speech disturbances: Secondary | ICD-10-CM | POA: Insufficient documentation

## 2015-02-11 DIAGNOSIS — R4702 Dysphasia: Secondary | ICD-10-CM | POA: Diagnosis not present

## 2015-02-11 DIAGNOSIS — E860 Dehydration: Secondary | ICD-10-CM | POA: Diagnosis present

## 2015-02-11 DIAGNOSIS — R42 Dizziness and giddiness: Secondary | ICD-10-CM | POA: Diagnosis not present

## 2015-02-11 DIAGNOSIS — C7951 Secondary malignant neoplasm of bone: Secondary | ICD-10-CM

## 2015-02-11 DIAGNOSIS — E039 Hypothyroidism, unspecified: Secondary | ICD-10-CM | POA: Diagnosis present

## 2015-02-11 DIAGNOSIS — Z7901 Long term (current) use of anticoagulants: Secondary | ICD-10-CM | POA: Diagnosis not present

## 2015-02-11 DIAGNOSIS — Z85820 Personal history of malignant melanoma of skin: Secondary | ICD-10-CM | POA: Diagnosis not present

## 2015-02-11 DIAGNOSIS — Z79899 Other long term (current) drug therapy: Secondary | ICD-10-CM | POA: Diagnosis not present

## 2015-02-11 DIAGNOSIS — R4789 Other speech disturbances: Secondary | ICD-10-CM | POA: Diagnosis not present

## 2015-02-11 DIAGNOSIS — E785 Hyperlipidemia, unspecified: Secondary | ICD-10-CM | POA: Diagnosis present

## 2015-02-11 DIAGNOSIS — R269 Unspecified abnormalities of gait and mobility: Secondary | ICD-10-CM

## 2015-02-11 DIAGNOSIS — Z8673 Personal history of transient ischemic attack (TIA), and cerebral infarction without residual deficits: Secondary | ICD-10-CM | POA: Insufficient documentation

## 2015-02-11 DIAGNOSIS — I63312 Cerebral infarction due to thrombosis of left middle cerebral artery: Secondary | ICD-10-CM | POA: Diagnosis not present

## 2015-02-11 LAB — LIPID PANEL
Cholesterol: 165 mg/dL (ref 0–200)
HDL: 70 mg/dL (ref >40)
LDL Cholesterol: 76 mg/dL (ref 0–99)
Total CHOL/HDL Ratio: 2.4 RATIO
Triglycerides: 96 mg/dL (ref <150)
VLDL: 19 mg/dL (ref 0–40)

## 2015-02-11 LAB — URINALYSIS, ROUTINE W REFLEX MICROSCOPIC
Bilirubin Urine: NEGATIVE
Glucose, UA: NEGATIVE mg/dL
HGB URINE DIPSTICK: NEGATIVE
Ketones, ur: NEGATIVE mg/dL
Leukocytes, UA: NEGATIVE
Nitrite: NEGATIVE
PROTEIN: NEGATIVE mg/dL
Specific Gravity, Urine: 1.018 (ref 1.005–1.030)
Urobilinogen, UA: 0.2 mg/dL (ref 0.0–1.0)
pH: 6.5 (ref 5.0–8.0)

## 2015-02-11 LAB — CK: Total CK: 60 U/L (ref 49–397)

## 2015-02-11 MED ORDER — STARCH (THICKENING) PO POWD
ORAL | Status: DC | PRN
  Filled 2015-02-11: qty 227

## 2015-02-11 MED ORDER — NON FORMULARY: 25.0000 mg | Freq: Every day | Status: DC

## 2015-02-11 MED ORDER — ASPIRIN 300 MG RE SUPP: 300.0000 mg | Freq: Every day | RECTAL | Status: DC

## 2015-02-11 MED ORDER — SODIUM CHLORIDE 0.9 % IV BOLUS (SEPSIS)
500.0000 mL | Freq: Once | INTRAVENOUS | Status: AC
  Administered 2015-02-11: 500 mL via INTRAVENOUS

## 2015-02-11 MED ORDER — STROKE: EARLY STAGES OF RECOVERY BOOK
Freq: Once | Status: AC
  Administered 2015-02-11: 07:00:00
  Filled 2015-02-11: qty 1

## 2015-02-11 MED ORDER — LENALIDOMIDE 15 MG PO CAPS: 25.0000 mg | ORAL_CAPSULE | Freq: Every day | ORAL | Status: DC

## 2015-02-11 MED ORDER — HEPARIN SODIUM (PORCINE) 5000 UNIT/ML IJ SOLN
5000.0000 [IU] | Freq: Three times a day (TID) | INTRAMUSCULAR | Status: DC
  Administered 2015-02-11 – 2015-02-12 (×5): 5000 [IU] via SUBCUTANEOUS
  Filled 2015-02-11 (×9): qty 1

## 2015-02-11 MED ORDER — NONFORMULARY OR COMPOUNDED ITEM
Freq: Every day | Status: DC
  Filled 2015-02-11: qty 1

## 2015-02-11 MED ORDER — SODIUM CHLORIDE 0.9 % IV SOLN
INTRAVENOUS | Status: AC
  Administered 2015-02-11 (×2): 75 mL via INTRAVENOUS
  Administered 2015-02-12: 05:00:00 via INTRAVENOUS

## 2015-02-11 MED ORDER — WHITE PETROLATUM GEL
Status: AC
  Administered 2015-02-11: 17:00:00
  Filled 2015-02-11: qty 1

## 2015-02-11 MED ORDER — ACETAMINOPHEN 650 MG RE SUPP: 650.0000 mg | RECTAL | Status: DC | PRN

## 2015-02-11 MED ORDER — ASPIRIN 325 MG PO TABS
325.0000 mg | ORAL_TABLET | Freq: Every day | ORAL | Status: DC
  Administered 2015-02-11 – 2015-02-12 (×2): 325 mg via ORAL
  Filled 2015-02-11 (×2): qty 1

## 2015-02-11 MED ORDER — AMPICILLIN-SULBACTAM SODIUM 3 (2-1) G IJ SOLR
3.0000 g | Freq: Four times a day (QID) | INTRAMUSCULAR | Status: DC
  Administered 2015-02-11 – 2015-02-12 (×6): 3 g via INTRAVENOUS
  Filled 2015-02-11 (×9): qty 3

## 2015-02-11 MED ORDER — ACETAMINOPHEN 325 MG PO TABS: 650.0000 mg | ORAL_TABLET | ORAL | Status: DC | PRN

## 2015-02-11 MED ORDER — NONFORMULARY OR COMPOUNDED ITEM
25.0000 mg | Freq: Every day | Status: DC
  Administered 2015-02-11: 25 mg via ORAL

## 2015-02-11 MED ORDER — RESOURCE THICKENUP CLEAR PO POWD
ORAL | Status: DC | PRN
  Filled 2015-02-11 (×2): qty 125

## 2015-02-11 NOTE — ED Provider Notes (Signed)
Medical screening examination/treatment/procedure(s) were conducted as a shared visit with non-physician practitioner(s) and myself.  I personally evaluated the patient during the encounter.   EKG Interpretation   Date/Time:  Saturday February 10 2015 21:54:11 EDT Ventricular Rate:  74 PR Interval:  197 QRS Duration: 102 QT Interval:  383 QTC Calculation: 425 R Axis:   93 Text Interpretation:  Sinus rhythm Atrial premature complex Right axis  deviation Confirmed by DOCHERTY  MD, MEGAN ([id number]) on 02/10/2015 10:01:01 PM       Pt with RLE weakness. Also some episodes of what sounds like aphasia and dysarthria, and an episode where he had hard time eating due to fine motor problems with the hand. ? Stroke. CT head is neg. Last normal 2 days ago, not a TPA candidate. He has some RLE weakness on exam - but his NIHSS is 0. He failed oral challenge here.  Spoke with Dr. Leonel Ramsay. He would appreciate admission to [facility] for further workup.  Varney Biles, MD 02/11/15 [phone]

## 2015-02-11 NOTE — ED Notes (Signed)
[facility] returned call; report on pt given.

## 2015-02-11 NOTE — Consult Note (Signed)
Neurology Consultation Reason for Consult: RLE weakness Referring Physician: Kathrynn Humble, A   CC: Right leg weakness  History is obtained from: Patient  HPI: Jeremiah Owens is a 81 y.o. male with a history of multiple myeloma treated with Revlamid who presents today with 2 days of right leg weakness and difficulty speaking. He states that this started quite abruptly. Prior to this, he did not have any problems with his speech either. He states that he has difficulty with finding his words, and indeed it is difficult getting history from him because of some word finding difficulty.   LKW: 7/8 tpa given?: no, out of window    ROS: A 14 point ROS was performed and is negative except as noted in the HPI.   Past Medical History  Diagnosis Date  . Compression fracture   . Allergy   . Anxiety   . Thyroid disease   . S/P radiation therapy 08/21/14-09/04/14    T4-6 25Gy/47f  . S/P radiation therapy 11/29/14-12/12/14    rt prox humerus/shoulder/scapula 25Gy/146f . Encounter for antineoplastic chemotherapy 01/31/2015  . Melanoma   . Bone cancer     t_spine T-5  . Prostate cancer 2002  . Skin cancer 1994    melanoma  right neck   . Multiple myeloma     Family History: No history of stroke  Social History: Tob: Denies  Exam: Current vital signs: BP 130/55 mmHg  Pulse 67  Temp(Src) 98.8 F (37.1 C) (Oral)  Resp 16  Ht '5\' 9"'  (1.753 m)  Wt 73.3 kg (161 lb 9.6 oz)  BMI 23.85 kg/m2  SpO2 94% Vital signs in last 24 hours: Temp:  [98.6 F (37 C)-98.8 F (37.1 C)] 98.8 F (37.1 C) (07/10 0413) Pulse Rate:  [67-86] 67 (07/10 0413) Resp:  [14-19] 16 (07/10 0413) BP: (112-149)/(54-74) 130/55 mmHg (07/10 0413) SpO2:  [94 %-98 %] 94 % (07/10 0413) Weight:  [72.576 kg (160 lb)-73.3 kg (161 lb 9.6 oz)] 73.3 kg (161 lb 9.6 oz) (07/10 0413)  Physical Exam  Constitutional: Appears well-developed and well-nourished.  Psych: Affect appropriate to situation Eyes: No scleral  injection HENT: No OP obstrucion Head: Normocephalic.  Cardiovascular: Normal rate and regular rhythm.  Respiratory: Effort normal and breath sounds normal to anterior ascultation GI: Soft.  No distension. There is no tenderness.  Skin: WDI  Neuro: Mental Status: Patient is awake, alert, interactive and appropriate Patient is able to give a clear and coherent history. No signs of aphasia or neglect Cranial Nerves: II: Visual Fields are full. Pupils are equal, round, and reactive to light.   III,IV, VI: EOMI without ptosis or diploplia.  V: Facial sensation is symmetric to temperature VII: Facial movement is mild delay with smiling on the right VIII: hearing is intact to voice X: Uvula elevates symmetrically XI: Shoulder shrug is symmetric. XII: tongue is midline without atrophy or fasciculations.  Motor: Tone is normal. Bulk is normal. 5/5 strength was present in all bilateral arms and left leg, he has a very mild possible weakness of the right leg Sensory: Sensation is symmetric to light touch and temperature in the arms and legs. Cerebellar: FNF  intact bilaterally         I have reviewed labs in epic and the results pertinent to this consultation are: CMP-unremarkable  I have reviewed the images obtained: CT head-I suspect there is a hypodensity in the white matter near the caudate head on the left could represent a subacute stroke  Impression: [age]ear old  male with mild right leg weakness and transcortical motor aphasia. I suspect that he has had an ischemic infarct which both his chemotherapy as well as his myeloma would predispose him to. I do think it is reasonable to do a workup to other possible predisposing factors as well.  Recommendations: 1. HgbA1c, fasting lipid panel 2. MRI, MRA  of the brain without contrast 3. Frequent neuro checks 4. Echocardiogram 5. Carotid dopplers 6. Prophylactic therapy-Antiplatelet med: Aspirin - dose 367m PO or 3031mPR 7.  Risk factor modification 8. Telemetry monitoring 9. PT consult, OT consult, Speech consult    McRoland RackMD [facility] [phone] 7pm- 7am, please page neurology on call as listed in [place]

## 2015-02-11 NOTE — Progress Notes (Signed)
[facility] PROGRESS NOTE   Abner Ardis YWV:[id number] DOB: Jan 20, 1930 DOA: 02/10/2015 PCP: Wenda Low, MD  HPI/Subjective: Denies any new complaints, feels really weak, did not sleep well last night  Assessment/Plan: Principal Problem:   CVA (cerebral infarction) Active Problems:   Spine metastasis   Multiple myeloma   Gait disorder   Peripheral edema   Aspiration pneumonia    This is a no charge note patient seen earlier today by my colleague Dr. Posey Pronto. [facility] with right lower extremity weakness and expressive aphasia. MRI showed left basal ganglia lacunar infarct. Stroke workup, follow neurology recommendations.  Code Status: Full Code Family Communication: Plan discussed with the patient. Disposition Plan: Remains inpatient Diet: Diet NPO time specified Except for: Other (See Comments)  Consultants:  Neurology  Procedures:  None  Antibiotics:  None   Objective: Filed Vitals:   02/11/15 0646  BP: 116/62  Pulse: 75  Temp: 98.6 F (37 C)  Resp: 16    Intake/Output Summary (Last 24 hours) at 02/11/15 1132 Last data filed at 02/11/15 0427  Gross per 24 hour  Intake    500 ml  Output    300 ml  Net    200 ml   Filed Weights   02/11/15 0305 02/11/15 0413  Weight: 72.576 kg (160 lb) 73.3 kg (161 lb 9.6 oz)    Exam: General: Alert and awake, oriented x3, not in any acute distress. HEENT: anicteric sclera, pupils reactive to light and accommodation, EOMI CVS: S1-S2 clear, no murmur rubs or gallops Chest: clear to auscultation bilaterally, no wheezing, rales or rhonchi Abdomen: soft nontender, nondistended, normal bowel sounds, no organomegaly Extremities: no cyanosis, clubbing or edema noted bilaterally Neuro: Cranial nerves II-XII intact, no focal neurological deficits  Data Reviewed: Basic Metabolic Panel:  Recent Labs Lab 02/07/15 1009 02/10/15 2209  NA 140 138  K 4.1 3.8  CL  --  103  CO2 29  27  GLUCOSE 91 111*  BUN 15.2 25*  CREATININE 0.9 1.21  CALCIUM 9.2 9.0   Liver Function Tests:  Recent Labs Lab 02/07/15 1009 02/10/15 2209  AST 20 21  ALT 21 19  ALKPHOS 63 56  BILITOT 0.62 0.7  PROT 5.7* 5.9*  ALBUMIN 3.5 3.5   No results for input(s): LIPASE, AMYLASE in the last 168 hours. No results for input(s): AMMONIA in the last 168 hours. CBC:  Recent Labs Lab 02/07/15 1009 02/10/15 2209  WBC 3.1* 2.6*  NEUTROABS 2.6 1.6*  HGB 12.3* 11.8*  HCT 36.3* 35.3*  MCV 95.2 94.9  PLT 135* 124*   Cardiac Enzymes:  Recent Labs Lab 02/10/15 2209  CKTOTAL 60   BNP (last 3 results) No results for input(s): BNP in the last 8760 hours.  ProBNP (last 3 results) No results for input(s): PROBNP in the last 8760 hours.  CBG:  Recent Labs Lab 02/10/15 2202  GLUCAP 112*    Micro No results found for this or any previous visit (from the past 240 hour(s)).   Studies: Dg Chest 2 View  02/11/2015   CLINICAL DATA:  Weakness  EXAM: CHEST  2 VIEW  COMPARISON:  06/17/2014  FINDINGS: Subpleural opacity in the right upper lobe is asymmetric and could reflect consolidation/pneumonia. This was not seen on scout from 10/17/2014 thoracic spine CT, arguing against neoplastic process. Chronic cardiomegaly. Negative aortic and hilar contours. Ventilation is low.  Multiple myeloma with upper thoracic posterior fixation.  IMPRESSION: 1. Subtle opacity in the right upper lobe,  potentially pneumonia. Recommend 3-4 week follow-up after treatment. 2. Hypoventilation.   Electronically Signed   By: Monte Fantasia M.D.   On: 02/11/2015 01:36   Ct Head Wo Contrast  02/11/2015   CLINICAL DATA:  Difficulty with speech with dizziness. Chronic right leg weakness.  EXAM: CT HEAD WITHOUT CONTRAST  TECHNIQUE: Contiguous axial images were obtained from the base of the skull through the vertex without intravenous contrast.  COMPARISON:  None.  FINDINGS: Skull and Sinuses:Negative for fracture or  destructive process. The mastoids, middle ears, and imaged paranasal sinuses are clear.  Orbits: No acute abnormality.  Brain: No evidence of acute gray matter infarction, hemorrhage, hydrocephalus, or mass lesion/mass effect. There is generalized brain atrophy with ventriculomegaly, mild. Chronic small-vessel disease with patchy ischemic gliosis throughout the bilateral cerebral white matter. There is ischemic change in the right putamen, likely chronic based on history.  IMPRESSION: 1. No intracranial hemorrhage or definite infarct. 2. Moderate chronic small vessel disease, which could obscure a recent white matter infarct.   Electronically Signed   By: Monte Fantasia M.D.   On: 02/11/2015 00:20   Mr Brain Wo Contrast  02/11/2015   CLINICAL DATA:  Acute on chronic RIGHT leg weakness, new onset garbled speech. History of melanoma, prostate cancer, stroke.  EXAM: MRI HEAD WITHOUT CONTRAST  MRA HEAD WITHOUT CONTRAST  TECHNIQUE: Multiplanar, multiecho pulse sequences of the brain and surrounding structures were obtained without intravenous contrast. Angiographic images of the head were obtained using MRA technique without contrast.  COMPARISON:  CT of the head February 10, 2015  FINDINGS: MRI HEAD FINDINGS  18 x 9 mm area of reduced diffusion in LEFT basal ganglia (lenticular striate nucleus), corresponding low ADC value consistent with acute ischemia. Corresponding mild FLAIR hyperintense signal. No susceptibility artifact to suggest hemorrhage.  Ventricles and sulci are normal for patient's age. Patchy to confluent supratentorial, pontine white matter T2 hyperintensities without midline shift, mass effect, mass lesions. Innumerable tiny T2 hyperintensities within the basal ganglia compatible with perivascular spaces. In addition, small old RIGHT caudate body lacunar infarct. No abnormal extra-axial fluid collections. Normal major intracranial vascular flow voids seen at the skull base.  Ocular globes and orbital  contents are unremarkable. Mild paranasal sinus mucosal thickening without air-fluid levels. Trace LEFT mastoid effusion.  MRA HEAD FINDINGS  Anterior circulation: Normal flow related enhancement of the included cervical, petrous, cavernous and supra clinoid internal carotid arteries. Patent anterior communicating artery. Normal flow related enhancement of the anterior and middle cerebral arteries, including more distal segments.  No large vessel occlusion, high-grade stenosis, aneurysm. Mild middle cerebral artery luminal irregularity.  Posterior circulation: Codominant vertebral artery's. Basilar artery is patent, with normal flow related enhancement of the main branch vessels. Normal flow related enhancement of the posterior cerebral arteries. Small LEFT posterior communicating artery is present.  No large vessel occlusion, high-grade stenosis, mild luminal irregularity of the posterior cerebral arteries. No aneurysm.  IMPRESSION: MRI HEAD: Acute 18 x 9 mm LEFT basal ganglia lacunar infarct.  Involutional changes. Moderate white matter changes compatible chronic small vessel ischemic disease. Old RIGHT caudate lacunar infarct.  MRA HEAD: No acute large vessel occlusion or high-grade stenosis.  Mild luminal irregularity intracranial vessels compatible with atherosclerosis.   Electronically Signed   By: Elon Alas M.D.   On: 02/11/2015 06:23   Mr Jodene Nam Head/brain Wo Cm  02/11/2015   CLINICAL DATA:  Acute on chronic RIGHT leg weakness, new onset garbled speech. History of melanoma, prostate cancer, stroke.  EXAM: MRI HEAD WITHOUT CONTRAST  MRA HEAD WITHOUT CONTRAST  TECHNIQUE: Multiplanar, multiecho pulse sequences of the brain and surrounding structures were obtained without intravenous contrast. Angiographic images of the head were obtained using MRA technique without contrast.  COMPARISON:  CT of the head February 10, 2015  FINDINGS: MRI HEAD FINDINGS  18 x 9 mm area of reduced diffusion in LEFT basal ganglia  (lenticular striate nucleus), corresponding low ADC value consistent with acute ischemia. Corresponding mild FLAIR hyperintense signal. No susceptibility artifact to suggest hemorrhage.  Ventricles and sulci are normal for patient's age. Patchy to confluent supratentorial, pontine white matter T2 hyperintensities without midline shift, mass effect, mass lesions. Innumerable tiny T2 hyperintensities within the basal ganglia compatible with perivascular spaces. In addition, small old RIGHT caudate body lacunar infarct. No abnormal extra-axial fluid collections. Normal major intracranial vascular flow voids seen at the skull base.  Ocular globes and orbital contents are unremarkable. Mild paranasal sinus mucosal thickening without air-fluid levels. Trace LEFT mastoid effusion.  MRA HEAD FINDINGS  Anterior circulation: Normal flow related enhancement of the included cervical, petrous, cavernous and supra clinoid internal carotid arteries. Patent anterior communicating artery. Normal flow related enhancement of the anterior and middle cerebral arteries, including more distal segments.  No large vessel occlusion, high-grade stenosis, aneurysm. Mild middle cerebral artery luminal irregularity.  Posterior circulation: Codominant vertebral artery's. Basilar artery is patent, with normal flow related enhancement of the main branch vessels. Normal flow related enhancement of the posterior cerebral arteries. Small LEFT posterior communicating artery is present.  No large vessel occlusion, high-grade stenosis, mild luminal irregularity of the posterior cerebral arteries. No aneurysm.  IMPRESSION: MRI HEAD: Acute 18 x 9 mm LEFT basal ganglia lacunar infarct.  Involutional changes. Moderate white matter changes compatible chronic small vessel ischemic disease. Old RIGHT caudate lacunar infarct.  MRA HEAD: No acute large vessel occlusion or high-grade stenosis.  Mild luminal irregularity intracranial vessels compatible with  atherosclerosis.   Electronically Signed   By: Elon Alas M.D.   On: 02/11/2015 06:23    Scheduled Meds: . ampicillin-sulbactam (UNASYN) IV  3 g Intravenous 4 times per day  . aspirin  300 mg Rectal Daily   Or  . aspirin  325 mg Oral Daily  . heparin  5,000 Units Subcutaneous 3 times per day   Continuous Infusions: . sodium chloride         Time spent: 35 minutes    [facility] A  [facility] Pager [phone] If 7PM-7AM, please contact night-coverage at [email or web address], password [email or web address] 02/11/2015, 11:32 AM  LOS: 0 days

## 2015-02-11 NOTE — Progress Notes (Signed)
STROKE TEAM PROGRESS NOTE   HISTORY Jeremiah Owens is an 81 y.o. male with a history of multiple myeloma treated with Revlamid who presents today with 2 days of right leg weakness and difficulty speaking. He states that this started quite abruptly. Prior to this, he did not have any problems with his speech either. He states that he has difficulty with finding his words, and indeed it is difficult getting history from him because of some word finding difficulty.   LKW: 7/8 tpa given?: no, out of window   SUBJECTIVE (INTERVAL HISTORY) No family members present. Difficult history secondary to patient's dysarthria and speech hesitancy. The patient states he has had difficulty with speech and right leg weakness since Wednesday. However, his home meds list includes coumadin but he is unsure why he is on coumadin. Tried to call his wife with 2 numbers on file but no one available.    OBJECTIVE Temp:  [98.6 F (37 C)-98.8 F (37.1 C)] 98.6 F (37 C) (07/10 0646) Pulse Rate:  [67-86] 75 (07/10 0646) Cardiac Rhythm:  [-] Normal sinus rhythm (07/10 0413) Resp:  [14-19] 16 (07/10 0646) BP: (112-149)/(54-74) 116/62 mmHg (07/10 0646) SpO2:  [94 %-98 %] 98 % (07/10 0646) Weight:  [72.576 kg (160 lb)-73.3 kg (161 lb 9.6 oz)] 73.3 kg (161 lb 9.6 oz) (07/10 0413)   Recent Labs Lab 02/10/15 2202  GLUCAP 112*    Recent Labs Lab 02/07/15 1009 02/10/15 2209  NA 140 138  K 4.1 3.8  CL  --  103  CO2 29 27  GLUCOSE 91 111*  BUN 15.2 25*  CREATININE 0.9 1.21  CALCIUM 9.2 9.0    Recent Labs Lab 02/07/15 1009 02/10/15 2209  AST 20 21  ALT 21 19  ALKPHOS 63 56  BILITOT 0.62 0.7  PROT 5.7* 5.9*  ALBUMIN 3.5 3.5    Recent Labs Lab 02/07/15 1009 02/10/15 2209  WBC 3.1* 2.6*  NEUTROABS 2.6 1.6*  HGB 12.3* 11.8*  HCT 36.3* 35.3*  MCV 95.2 94.9  PLT 135* 124*    Recent Labs Lab 02/10/15 2209  CKTOTAL 60    Recent Labs  02/10/15 2209  LABPROT 14.6  INR 1.12    Recent  Labs  02/10/15 2355  COLORURINE YELLOW  LABSPEC 1.018  PHURINE 6.5  GLUCOSEU NEGATIVE  HGBUR NEGATIVE  BILIRUBINUR NEGATIVE  KETONESUR NEGATIVE  PROTEINUR NEGATIVE  UROBILINOGEN 0.2  NITRITE NEGATIVE  LEUKOCYTESUR NEGATIVE    No results found for: CHOL, TRIG, HDL, CHOLHDL, VLDL, LDLCALC No results found for: HGBA1C No results found for: LABOPIA, COCAINSCRNUR, LABBENZ, AMPHETMU, THCU, LABBARB  No results for input(s): ETH in the last 168 hours.   Imaging  Dg Chest 2 View 02/11/2015    1. Subtle opacity in the right upper lobe, potentially pneumonia. Recommend 3-4 week follow-up after treatment.  2. Hypoventilation.     Ct Head Wo Contrast 02/11/2015    1. No intracranial hemorrhage or definite infarct.  2. Moderate chronic small vessel disease, which could obscure a recent white matter infarct.     Mr Brain Wo Contrast 02/11/2015     MRI HEAD:  Acute 18 x 9 mm LEFT basal ganglia lacunar infarct.  Involutional changes. Moderate white matter changes compatible chronic small vessel ischemic disease. Old RIGHT caudate lacunar infarct.    MRA HEAD:  No acute large vessel occlusion or high-grade stenosis.  Mild luminal irregularity intracranial vessels compatible with atherosclerosis.     TTE - pending  CUS - pending  PHYSICAL EXAM  Temp:  [98.6 F (37 C)-98.8 F (37.1 C)] 98.6 F (37 C) (07/10 0646) Pulse Rate:  [67-86] 75 (07/10 0646) Resp:  [14-19] 16 (07/10 0646) BP: (112-149)/(54-74) 116/62 mmHg (07/10 0646) SpO2:  [94 %-98 %] 98 % (07/10 0646) Weight:  [160 lb (72.576 kg)-161 lb 9.6 oz (73.3 kg)] 161 lb 9.6 oz (73.3 kg) (07/10 0413)  General - Well nourished, well developed, in no apparent distress.  Ophthalmologic - Fundi not visualized due to noncorporation.  Cardiovascular - Regular rate and rhythm.  Mental Status -  Level of arousal and orientation to time, place were intact, not orientated to people. Language including expression, naming,  repetition, comprehension was assessed and found intact, but with psychomotor slowing and moderate to severe dysarthria. Fund of Knowledge was assessed and was impaired.  Cranial Nerves II - XII - II - Visual field intact OU. III, IV, VI - Extraocular movements intact. V - Facial sensation intact bilaterally. VII - Facial movement intact bilaterally. VIII - Hearing & vestibular intact bilaterally. X - Palate elevates symmetrically, moderate to severe dysarthria. XI - Chin turning & shoulder shrug intact bilaterally. XII - Tongue protrusion intact.  Motor Strength - The patient's strength was normal in all extremities and pronator drift was absent.  Bulk was normal and fasciculations were absent.   Motor Tone - Muscle tone was assessed at the neck and appendages and was normal.  Reflexes - The patient's reflexes were 1+ in all extremities and he had no pathological reflexes.  Sensory - Light touch, temperature/pinprick were assessed and were symmetrical.    Coordination - The patient had normal movements in the hands and feet with no ataxia or dysmetria.  Tremor was absent.  Gait and Station - deferred due to safety concerns  ASSESSMENT/PLAN Jeremiah Owens is a 81 y.o. 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 He did not receive IV t-PA due to late presentation.   Stroke: left BG infarct likely secondary to small vessel disease.  Resultant - dysarthria  MRI  LEFT basal ganglia lacunar infarct.  Old RIGHT caudate lacunar infarct.   MRA  No acute large vessel occlusion or high-grade stenosis.  Carotid Doppler pending  2D Echo pending  LDL pending   HgbA1c pending  Subcutaneous heparin for VTE prophylaxis  Diet NPO time specified Except for: Other (See  Comments)  warfarin prior to admission, now on aspirin 325 mg orally every day  Patient counseled to be compliant with his antithrombotic medications  Ongoing aggressive stroke risk factor management  Therapy recommendations: Pending  Disposition: Pending  Hypertension  Home meds: No antihypertensives medications prior to admission. No previous history of hypertension.  Stable  Hyperlipidemia  Home meds: No lipid lowering medications prior to admission. No previous history of hyperlipidemia  LDL pending, goal < 70  Other Stroke Risk Factors  Advanced age  Hx stroke/TIA  Other Active Problems  Leukopenia - pancytopenia  Right upper lobe opacity - follow-up recommended  Mild dehydration - normal saline at 75 mL per hour - DC IV tomorrow - labs in a.m.  MM with plasmacytoma at the T5 vertebrae as well as epidural tumor status post resection by neurosurgery  Other Pertinent History  Iodine allergy  Low-dose Coumadin therapy - suspect DVT prophylaxis - reviewed previous notes - first listed in  medication list office visit 09/27/2014 - tried to contact wife via the 2 numbers on file but no one available.   Hospital day # 0  Mikey Bussing PA-C [facility] Pager [phone] 02/11/2015, 9:08 AM  I, the attending vascular neurologist, have personally obtained a history, examined the patient, evaluated laboratory data, individually viewed imaging studies and agree with radiology interpretations.  Together with the NP/PA, we formulated the assessment and plan of care which reflects our mutual decision.  I have made any additions or clarifications directly to the above note and agree with the findings and plan as currently documented.   81 yo M with hx of MM with plasmacytoma at the T5 vertebrae as well as epidural tumor status post resection by neurosurgery and on chemo therapy admitted for dysarthria and right leg weakness. Currently did not find  significant right leg weakness but dysarthria is moderate to severe. MRI showed left BG infarct, likely due to small vessel disease. MRA negative. Stroke work up in pending. On ASA. Pt has coumadin listed in home meds but not sure why, tried to contact wife not successful.   Rosalin Hawking, MD PhD Stroke Neurology 02/11/2015 12:56 PM    To contact Stroke Continuity provider, please refer to [email or web address] After hours, contact General Neurology

## 2015-02-11 NOTE — Evaluation (Signed)
Clinical/Bedside Swallow Evaluation Patient Details  Name: Jeremiah Owens MRN: [id number] Date of Birth: Oct 28, 1929  Today's Date: 02/11/2015 Time: SLP Start Time (ACUTE ONLY): 37 SLP Stop Time (ACUTE ONLY): 1031 SLP Time Calculation (min) (ACUTE ONLY): 12 min  Past Medical History:  Past Medical History  Diagnosis Date  . Compression fracture   . Allergy   . Anxiety   . Thyroid disease   . S/P radiation therapy 08/21/14-09/04/14    T4-6 25Gy/44f  . S/P radiation therapy 11/29/14-12/12/14    rt prox humerus/shoulder/scapula 25Gy/174f . Encounter for antineoplastic chemotherapy 01/31/2015  . Melanoma   . Bone cancer     t_spine T-5  . Prostate cancer 2002  . Skin cancer 1994    melanoma  right neck   . Multiple myeloma    Past Surgical History:  Past Surgical History  Procedure Laterality Date  . Hernia repair    . Posterior cervical fusion/foraminotomy N/A 06/17/2014    Procedure: Thoracic five laminectomy for epidural tumor resection Thoracic 4-7 posterior lateral arthrodesis, segmental pedicle screw fixation.;  Surgeon: KyAshok PallMD;  [facility];  Service: Neurosurgery;  Laterality: N/A;   HPI:  Jeremiah Owens a 89.o. male with a history of multiple myeloma treated with Revlamid who presents today with 2 days of right leg weakness and difficulty speaking. He states that this started quite abruptly. Prior to this, he did not have any problems with his speech either. He states that he has difficulty with finding his words, and indeed it is difficult getting history from him because of some word finding difficulty. MRI shows Acute 18 x 9 mm LEFT basal ganglia lacunar infarct   Assessment / Plan / Recommendation Clinical Impression  Pt demonstrates impaired swallow function with subjectively delyaed swallow response and wet vocal quality and cough indicative of decreased airway protection. Function is impaired with all sips, but sensation of aspiration only  observed with large sips. Concerned for sensory impairment and objective test warranted to evaluate pt prior to initiating PO diet. Pt may take pills in puree as needed prior to MBS.     Aspiration Risk  Moderate    Diet Recommendation NPO except meds   Medication Administration: Whole meds with puree    Other  Recommendations     Follow Up Recommendations       Frequency and Duration        Pertinent Vitals/Pain NA    SLP Swallow Goals     Swallow Study Prior Functional Status       General Other Pertinent Information: Jeremiah Owens a [age].o. male with a history of multiple myeloma treated with Revlamid who presents today with 2 days of right leg weakness and difficulty speaking. He states that this started quite abruptly. Prior to this, he did not have any problems with his speech either. He states that he has difficulty with finding his words, and indeed it is difficult getting history from him because of some word finding difficulty. MRI shows Acute 18 x 9 mm LEFT basal ganglia lacunar infarct Type of Study: Bedside swallow evaluation Diet Prior to this Study: NPO Temperature Spikes Noted: No Respiratory Status: Room air History of Recent Intubation: No Behavior/Cognition: Alert;Cooperative Oral Cavity - Dentition: Adequate natural dentition/normal for age Self-Feeding Abilities: Able to feed self Patient Positioning: Upright in bed Baseline Vocal Quality: Normal Volitional Cough: Strong Volitional Swallow: Able to elicit    Oral/Motor/Sensory Function Overall Oral Motor/Sensory Function: Other (comment) (  No focal weakness observed, but suspect base of tongue )   Ice Chips     Thin Liquid Thin Liquid: Impaired Presentation: Cup;Straw;Self Fed Pharyngeal  Phase Impairments: Suspected delayed Swallow;Multiple swallows;Wet Vocal Quality;Cough - Delayed    Nectar Thick Nectar Thick Liquid: Not tested   Honey Thick Honey Thick Liquid: Not tested   Puree Puree:  Within functional limits   Solid   GO    Solid: Not tested       , MA CCC-SLP [phone]  ,  Caroline 02/11/2015,10:37 AM      

## 2015-02-11 NOTE — ED Notes (Signed)
[facility] here to transfer pt to [facility]. 

## 2015-02-11 NOTE — Progress Notes (Signed)
ANTIBIOTIC CONSULT NOTE - INITIAL  Pharmacy Consult for unasyn Indication: Asp. PNA  Allergies  Allergen Reactions  . Iodine Itching    Patient had a reaction 20 years ago and can't remember what kind of reaction he had.  Patient was given decadron in the ER and had an Angio ordered and did test with no problems.  Bottom line "patient needs to pre-medicated before any IV dye is given".    Patient Measurements: Height: '5\' 9"'  (175.3 cm) Weight: 160 lb (72.576 kg) IBW/kg (Calculated) : 70.7 Adjusted Body Weight:   Vital Signs: Temp: 98.6 F (37 C) (07/10 0001) Temp Source: Oral (07/10 0217) BP: 133/64 mmHg (07/10 0300) Pulse Rate: 73 (07/10 0300) Intake/Output from previous day: 07/09 0701 - 07/10 0700 In: 500 [I.V.:500] Out: 200 [Urine:200] Intake/Output from this shift: Total I/O In: 500 [I.V.:500] Out: 200 [Urine:200]  Labs:  Recent Labs  02/10/15 2209  WBC 2.6*  HGB 11.8*  PLT 124*  CREATININE 1.21   Estimated Creatinine Clearance: 46.3 mL/min (by C-G formula based on Cr of 1.21). No results for input(s): VANCOTROUGH, VANCOPEAK, VANCORANDOM, GENTTROUGH, GENTPEAK, GENTRANDOM, TOBRATROUGH, TOBRAPEAK, TOBRARND, AMIKACINPEAK, AMIKACINTROU, AMIKACIN in the last 72 hours.   Microbiology: No results found for this or any previous visit (from the past 720 hour(s)).  Medical History: Past Medical History  Diagnosis Date  . Compression fracture   . Allergy   . Anxiety   . Thyroid disease   . S/P radiation therapy 08/21/14-09/04/14    T4-6 25Gy/60f  . S/P radiation therapy 11/29/14-12/12/14    rt prox humerus/shoulder/scapula 25Gy/115f . Encounter for antineoplastic chemotherapy 01/31/2015  . Melanoma   . Bone cancer     t_spine T-5  . Prostate cancer 2002  . Skin cancer 1994    melanoma  right neck   . Multiple myeloma     Medications:  Anti-infectives    Start     Dose/Rate Route Frequency Ordered Stop   02/11/15 0330  Ampicillin-Sulbactam (UNASYN) 3 g in  sodium chloride 0.9 % 100 mL IVPB     3 g 100 mL/hr over 60 Minutes Intravenous 4 times per day 02/11/15 0319       Assessment: Patient with Asp. PNA.  Goal of Therapy:  Unasyn dosed based on patient weight and renal function   Plan:  Follow up culture results  Unasyn 3gm iv q6hr  GrTyler DeisJuShea Stakesrowford 02/11/2015,3:23 AM

## 2015-02-11 NOTE — ED Notes (Signed)
[facility] was notified of pt's transfer to [facility]. 

## 2015-02-11 NOTE — H&P (Signed)
[facility] History and Physical  Patient: Jeremiah Owens  MRN: [id number]  DOB: 03/18/1930  DOS: the patient was seen and examined on 02/11/2015 PCP: Wenda Low, MD  Referring physician: Dr. Kathrynn Humble Chief Complaint: Right-sided weakness  HPI: Jeremiah Owens is a 81 y.o. male with Past medical history of multiple myeloma on chemotherapy, radiation therapy for spinal metastasis, hypothyroidism, gait imbalance. The patient is presenting with complaints of right-sided weakness ongoing since last 2 days progressively worsening. Patient also has developed expressive aphasia with difficulty speaking the right words. There is complaining from wife that today early in the morning while drinking coffee the patient had aspiration and also during the lunch he may have an aspiration. No fall no trauma no injury reported. No fever no chills no cough no chest pain and abdominal pain no diarrhea no constipation. No burning urination. At the time of my evaluation the patient complains of weakness of the right leg. No other recent change in his medications. Patient takes warfarin but it is a fixed dose of warfarin without any monitoring for INR as he is on Revlimid.  the patient is coming from home.  And is independent for most of his ADL manages her medication on his own.  Review of Systems: as mentioned in the history of present illness.  A comprehensive review of the other systems is negative.  Past Medical History  Diagnosis Date  . Compression fracture   . Allergy   . Anxiety   . Thyroid disease   . S/P radiation therapy 08/21/14-09/04/14    T4-6 25Gy/85f  . S/P radiation therapy 11/29/14-12/12/14    rt prox humerus/shoulder/scapula 25Gy/168f . Encounter for antineoplastic chemotherapy 01/31/2015  . Melanoma   . Bone cancer     t_spine T-5  . Prostate cancer 2002  . Skin cancer 1994    melanoma  right neck   . Multiple myeloma    Past Surgical History  Procedure Laterality Date    . Hernia repair    . Posterior cervical fusion/foraminotomy N/A 06/17/2014    Procedure: Thoracic five laminectomy for epidural tumor resection Thoracic 4-7 posterior lateral arthrodesis, segmental pedicle screw fixation.;  Surgeon: KyAshok PallMD;  [facility];  Service: Neurosurgery;  Laterality: N/A;   Social History:  reports that he has never smoked. He does not have any smokeless tobacco history on file. He reports that he does not drink alcohol or use illicit drugs.  Allergies  Allergen Reactions  . Iodine Itching    Patient had a reaction 20 years ago and can't remember what kind of reaction he had.  Patient was given decadron in the ER and had an Angio ordered and did test with no problems.  Bottom line "patient needs to pre-medicated before any IV dye is given".    Family History  Problem Relation Age of Onset  . Cancer Mother     breast    Prior to Admission medications   Medication Sig Start Date End Date Taking? Authorizing Provider  acyclovir (ZOVIRAX) 400 MG tablet TAKE 1 TABLET BY MOUTH TWICE A DAY 01/05/15  Yes MoCurt BearsMD  Calcium Carbonate-Vitamin D (CALTRATE 600+D PO) Take 600 mg by mouth 2 (two) times daily.   Yes Historical Provider, MD  cholecalciferol (VITAMIN D) 1000 UNITS tablet Take 1,000 Units by mouth at bedtime.    Yes Historical Provider, MD  furosemide (LASIX) 20 MG tablet Take 1 tablet (20 mg total) by mouth daily. As needed for swelling Patient taking  differently: Take 20 mg by mouth 2 (two) times daily. As needed for swelling 11/15/14  Yes Curt Bears, MD  HYDROcodone-acetaminophen (NORCO) 5-325 MG per tablet Take 1 tablet by mouth every 6 (six) hours as needed for moderate pain. 10/11/14  Yes Adrena E Johnson, PA-C  HYDROCORTISONE, TOPICAL, 2.5 % SOLN Apply 1 application topically 3 (three) times daily as needed (for itching).    Yes Historical Provider, MD  lenalidomide (REVLIMID) 25 MG capsule Take 1 capsule (25 mg total) by mouth  daily. Take one capsule ( 25 mg) by mouth for 21 days every 4 weeks-01/31/15 Authorization number=[id number]  adult male 01/31/15  Yes Curt Bears, MD  levothyroxine (SYNTHROID, LEVOTHROID) 25 MCG tablet Take 1 tablet (25 mcg total) by mouth daily. 07/07/14  Yes Daniel J Angiulli, PA-C  methocarbamol (ROBAXIN) 500 MG tablet TAKE 1 TABLET (500 MG TOTAL) BY MOUTH EVERY 6 (SIX) HOURS AS NEEDED FOR MUSCLE SPASMS. 10/02/14  Yes Meredith Staggers, MD  Multiple Vitamins-Minerals (CENTRUM SILVER PO) Take 1 tablet by mouth daily.   Yes Historical Provider, MD  omeprazole (PRILOSEC) 40 MG capsule Take 40 mg by mouth daily.   Yes Historical Provider, MD  Polyvinyl Alcohol-Povidone (REFRESH OP) Apply 1 drop to eye 4 (four) times daily as needed (dry/irritated eyes).    Yes Historical Provider, MD  potassium chloride (K-DUR,KLOR-CON) 10 MEQ tablet Take 10 mEq by mouth daily.   Yes Historical Provider, MD  prochlorperazine (COMPAZINE) 10 MG tablet Take 1 tablet (10 mg total) by mouth every 6 (six) hours as needed for nausea or vomiting. 01/10/15  Yes Curt Bears, MD  sennosides-docusate sodium (SENOKOT-S) 8.6-50 MG tablet Take 1-2 tablets by mouth 2 (two) times daily as needed for constipation. Takes 1-2 tabs daily   Yes Historical Provider, MD  tamsulosin (FLOMAX) 0.4 MG CAPS capsule Take 1 capsule (0.4 mg total) by mouth at bedtime. 07/07/14  Yes Daniel J Angiulli, PA-C  warfarin (COUMADIN) 2 MG tablet Take 1 tablet (2 mg total) by mouth daily. 01/29/15  Yes Adrena E Johnson, PA-C  dexamethasone (DECADRON) 4 MG tablet 10 TAB EVERY WEEK, START WITH CHEMO 12/06/14   Curt Bears, MD  ranitidine (ZANTAC) 150 MG tablet Take 150 mg by mouth daily as needed for heartburn.  05/23/14   Historical Provider, MD    Physical Exam: Filed Vitals:   02/11/15 0230 02/11/15 0300 02/11/15 0305 02/11/15 0413  BP: 112/61 133/64  130/55  Pulse: 69 73  67  Temp:    98.8 F (37.1 C)  TempSrc:    Oral  Resp: '16 16  16  ' Height:    '5\' 9"'  (1.753 m) '5\' 9"'  (1.753 m)  Weight:   72.576 kg (160 lb) 73.3 kg (161 lb 9.6 oz)  SpO2: 97% 95%  94%    General: Alert, Awake and Oriented to Time, Place and Person. Appear in mild distress Eyes: PERRL ENT: Oral Mucosa clear moist Neck: no JVD Cardiovascular: S1 and S2 Present, no Murmur, Peripheral Pulses Present Respiratory: Bilateral Air entry equal and Decreased,  Clear to Auscultation, no Crackles, no wheezes Abdomen: Bowel Sound present, Soft and non tender Skin: no Rash Extremities: no Pedal edema, no calf tenderness Neurologic:  right-sided past-pointing, right upper and lower extremity weakness.  Labs on Admission:  CBC:  Recent Labs Lab 02/07/15 1009 02/10/15 2209  WBC 3.1* 2.6*  NEUTROABS 2.6 1.6*  HGB 12.3* 11.8*  HCT 36.3* 35.3*  MCV 95.2 94.9  PLT 135* 124*    CMP  Component Value Date/Time   NA 138 02/10/2015 2209   NA 140 02/07/2015 1009   K 3.8 02/10/2015 2209   K 4.1 02/07/2015 1009   CL 103 02/10/2015 2209   CO2 27 02/10/2015 2209   CO2 29 02/07/2015 1009   GLUCOSE 111* 02/10/2015 2209   GLUCOSE 91 02/07/2015 1009   BUN 25* 02/10/2015 2209   BUN 15.2 02/07/2015 1009   CREATININE 1.21 02/10/2015 2209   CREATININE 0.9 02/07/2015 1009   CALCIUM 9.0 02/10/2015 2209   CALCIUM 9.2 02/07/2015 1009   PROT 5.9* 02/10/2015 2209   PROT 5.7* 02/07/2015 1009   ALBUMIN 3.5 02/10/2015 2209   ALBUMIN 3.5 02/07/2015 1009   AST 21 02/10/2015 2209   AST 20 02/07/2015 1009   ALT 19 02/10/2015 2209   ALT 21 02/07/2015 1009   ALKPHOS 56 02/10/2015 2209   ALKPHOS 63 02/07/2015 1009   BILITOT 0.7 02/10/2015 2209   BILITOT 0.62 02/07/2015 1009   GFRNONAA 54* 02/10/2015 2209   GFRAA >60 02/10/2015 2209    No results for input(s): LIPASE, AMYLASE in the last 168 hours.   Recent Labs Lab 02/10/15 2209  CKTOTAL 60   BNP (last 3 results) No results for input(s): BNP in the last 8760 hours.  ProBNP (last 3 results) No results for input(s):  PROBNP in the last 8760 hours.   Radiological Exams on Admission: Dg Chest 2 View  02/11/2015   CLINICAL DATA:  Weakness  EXAM: CHEST  2 VIEW  COMPARISON:  06/17/2014  FINDINGS: Subpleural opacity in the right upper lobe is asymmetric and could reflect consolidation/pneumonia. This was not seen on scout from 10/17/2014 thoracic spine CT, arguing against neoplastic process. Chronic cardiomegaly. Negative aortic and hilar contours. Ventilation is low.  Multiple myeloma with upper thoracic posterior fixation.  IMPRESSION: 1. Subtle opacity in the right upper lobe, potentially pneumonia. Recommend 3-4 week follow-up after treatment. 2. Hypoventilation.   Electronically Signed   By: Monte Fantasia M.D.   On: 02/11/2015 01:36   Ct Head Wo Contrast  02/11/2015   CLINICAL DATA:  Difficulty with speech with dizziness. Chronic right leg weakness.  EXAM: CT HEAD WITHOUT CONTRAST  TECHNIQUE: Contiguous axial images were obtained from the base of the skull through the vertex without intravenous contrast.  COMPARISON:  None.  FINDINGS: Skull and Sinuses:Negative for fracture or destructive process. The mastoids, middle ears, and imaged paranasal sinuses are clear.  Orbits: No acute abnormality.  Brain: No evidence of acute gray matter infarction, hemorrhage, hydrocephalus, or mass lesion/mass effect. There is generalized brain atrophy with ventriculomegaly, mild. Chronic small-vessel disease with patchy ischemic gliosis throughout the bilateral cerebral white matter. There is ischemic change in the right putamen, likely chronic based on history.  IMPRESSION: 1. No intracranial hemorrhage or definite infarct. 2. Moderate chronic small vessel disease, which could obscure a recent white matter infarct.   Electronically Signed   By: Monte Fantasia M.D.   On: 02/11/2015 00:20   EKG: Independently reviewed. normal sinus rhythm, nonspecific ST and T waves changes.  Assessment/Plan Principal Problem:   CVA (cerebral  infarction) Active Problems:   Spine metastasis   Multiple myeloma   Gait disorder   Peripheral edema   1. CVA (cerebral infarction)  the patient is presenting with complaints of right-sided weakness as well as expressive aphasia. CT scan is negative. The patient is out of window period for TPA. Findings are consistent with a possible CVA. With this the patient will be transferred to  [facility] for neurology evaluation. PTOT speech evaluation. Nothing by mouth pending stroke evaluation. Aspirin. Patient may require increasing his warfarin from fix the dose to therapeutic dose depending on the road recommendation.  2. multiple myeloma with spinal metastasis. Currently on chemotherapy which will be on hold. Resume acyclovir, Norco once patient is allowed to take oral. Resume Revlimid based on oncology recommendation.  3. hypothyroidism. Resume Synthroid once patient is able to take orally.  4. BPH. Resume Flomax once the patient is able to take orally.  5. possible aspiration pneumonia. Chest x-ray shows evidence of right upper lobe infiltrate consistent with pneumonia. Patient will be treated with Unasyn. Nothing by mouth pending speech evaluation.  Advance goals of care discuss full code  Consults: ED discussed with Dr. Leonel Ramsay  from Neurology.  DVT Prophylaxis: subcutaneous Heparin Nutrition: Nothing by mouth  Family Communication: family was present at bedside, opportunity was given to ask question and all questions were answered satisfactorily at the time of interview. Disposition: Admitted as inpatient telemetry unit.  Author: Berle Mull, MD [facility] Pager: [phone] 02/11/2015  If 7PM-7AM, please contact night-coverage [email or web address] Password [email or web address]

## 2015-02-11 NOTE — Progress Notes (Signed)
MBSS complete. Full report located under chart review in imaging section. Zahava Quant, MA CCC-SLP [phone]  

## 2015-02-11 NOTE — Progress Notes (Signed)
Patient arrived to 4N27 at 0400, alert & oriented denies pain at this time.  Transferred from [facility] ED.  Oriented to room and use of call light system.

## 2015-02-11 NOTE — Progress Notes (Signed)
VASCULAR LAB PRELIMINARY  PRELIMINARY  PRELIMINARY  PRELIMINARY  Carotid Dopplers completed.    Preliminary report:  1-39% ICA stenosis.  Vertebral artery flow is antegrade.  Tortuous mid-distal ICA vessels, bilaterally.  Jeremiah Owens, RVT 02/11/2015, 4:15 PM

## 2015-02-11 NOTE — Discharge Instructions (Signed)
Read the information below.  You may return to the Emergency Department at any time for worsening condition or any new symptoms that concern you.   If you develop fevers, loss of control of bowel or bladder, worsening weakness or numbness in your legs, or are unable to walk, return to the ER for a recheck.     Dizziness Dizziness is a common problem. It is a feeling of unsteadiness or light-headedness. You may feel like you are about to faint. Dizziness can lead to injury if you stumble or fall. A person of any age group can suffer from dizziness, but dizziness is more common in older adults. CAUSES  Dizziness can be caused by many different things, including:  Middle ear problems.  Standing for too long.  Infections.  An allergic reaction.  Aging.  An emotional response to something, such as the sight of blood.  Side effects of medicines.  Tiredness.  Problems with circulation or blood pressure.  Excessive use of alcohol or medicines, or illegal drug use.  Breathing too fast (hyperventilation).  An irregular heart rhythm (arrhythmia).  A low red blood cell count (anemia).  Pregnancy.  Vomiting, diarrhea, fever, or other illnesses that cause body fluid loss (dehydration).  Diseases or conditions such as Parkinson's disease, high blood pressure (hypertension), diabetes, and thyroid problems.  Exposure to extreme heat. DIAGNOSIS  Your health care provider will ask about your symptoms, perform a physical exam, and perform an electrocardiogram (ECG) to record the electrical activity of your heart. Your health care provider may also perform other heart or blood tests to determine the cause of your dizziness. These may include:  Transthoracic echocardiogram (TTE). During echocardiography, sound waves are used to evaluate how blood flows through your heart.  Transesophageal echocardiogram (TEE).  Cardiac monitoring. This allows your health care provider to monitor your heart  rate and rhythm in real time.  Holter monitor. This is a portable device that records your heartbeat and can help diagnose heart arrhythmias. It allows your health care provider to track your heart activity for several days if needed.  Stress tests by exercise or by giving medicine that makes the heart beat faster. TREATMENT  Treatment of dizziness depends on the cause of your symptoms and can vary greatly. HOME CARE INSTRUCTIONS   Drink enough fluids to keep your urine clear or pale yellow. This is especially important in very hot weather. In older adults, it is also important in cold weather.  Take your medicine exactly as directed if your dizziness is caused by medicines. When taking blood pressure medicines, it is especially important to get up slowly.  Rise slowly from chairs and steady yourself until you feel okay.  In the morning, first sit up on the side of the bed. When you feel okay, stand slowly while holding onto something until you know your balance is fine.  Move your legs often if you need to stand in one place for a long time. Tighten and relax your muscles in your legs while standing.  Have someone stay with you for 1-2 days if dizziness continues to be a problem. Do this until you feel you are well enough to stay alone. Have the person call your health care provider if he or she notices changes in you that are concerning.  Do not drive or use heavy machinery if you feel dizzy.  Do not drink alcohol. SEEK IMMEDIATE MEDICAL CARE IF:   Your dizziness or light-headedness gets worse.  You feel nauseous  or vomit.  You have problems talking, walking, or using your arms, hands, or legs.  You feel weak.  You are not thinking clearly or you have trouble forming sentences. It may take a friend or family member to notice this.  You have chest pain, abdominal pain, shortness of breath, or sweating.  Your vision changes.  You notice any bleeding.  You have side effects  from medicine that seems to be getting worse rather than better. MAKE SURE YOU:   Understand these instructions.  Will watch your condition.  Will get help right away if you are not doing well or get worse. Document Released: 01/14/2001 Document Revised: 07/26/2013 Document Reviewed: 02/07/2011 [facility] Patient Information 2015 [place], [place]. This information is not intended to replace advice given to you by your health care provider. Make sure you discuss any questions you have with your health care provider.

## 2015-02-12 ENCOUNTER — Inpatient Hospital Stay (HOSPITAL_COMMUNITY): Payer: Medicare Other

## 2015-02-12 ENCOUNTER — Inpatient Hospital Stay (HOSPITAL_COMMUNITY)
Admission: RE | Admit: 2015-02-12 | Discharge: 2015-02-21 | DRG: 056 | Disposition: A | Discharge status: Home / Self Care | Payer: Medicare Other | Source: Intra-hospital | Attending: Physical Medicine & Rehabilitation | Admitting: Physical Medicine & Rehabilitation

## 2015-02-12 DIAGNOSIS — R197 Diarrhea, unspecified: Secondary | ICD-10-CM

## 2015-02-12 DIAGNOSIS — Z981 Arthrodesis status: Secondary | ICD-10-CM

## 2015-02-12 DIAGNOSIS — I6381 Other cerebral infarction due to occlusion or stenosis of small artery: Secondary | ICD-10-CM | POA: Diagnosis present

## 2015-02-12 DIAGNOSIS — C9000 Multiple myeloma not having achieved remission: Secondary | ICD-10-CM

## 2015-02-12 DIAGNOSIS — N400 Enlarged prostate without lower urinary tract symptoms: Secondary | ICD-10-CM | POA: Diagnosis not present

## 2015-02-12 DIAGNOSIS — R278 Other lack of coordination: Secondary | ICD-10-CM

## 2015-02-12 DIAGNOSIS — Z8546 Personal history of malignant neoplasm of prostate: Secondary | ICD-10-CM

## 2015-02-12 DIAGNOSIS — I639 Cerebral infarction, unspecified: Secondary | ICD-10-CM | POA: Diagnosis present

## 2015-02-12 DIAGNOSIS — Z79899 Other long term (current) drug therapy: Secondary | ICD-10-CM | POA: Diagnosis not present

## 2015-02-12 DIAGNOSIS — Z923 Personal history of irradiation: Secondary | ICD-10-CM | POA: Diagnosis not present

## 2015-02-12 DIAGNOSIS — I63312 Cerebral infarction due to thrombosis of left middle cerebral artery: Secondary | ICD-10-CM

## 2015-02-12 DIAGNOSIS — J690 Pneumonitis due to inhalation of food and vomit: Secondary | ICD-10-CM | POA: Diagnosis present

## 2015-02-12 DIAGNOSIS — I69328 Other speech and language deficits following cerebral infarction: Secondary | ICD-10-CM | POA: Diagnosis not present

## 2015-02-12 DIAGNOSIS — R42 Dizziness and giddiness: Secondary | ICD-10-CM | POA: Insufficient documentation

## 2015-02-12 DIAGNOSIS — R4701 Aphasia: Secondary | ICD-10-CM | POA: Diagnosis not present

## 2015-02-12 DIAGNOSIS — H9190 Unspecified hearing loss, unspecified ear: Secondary | ICD-10-CM

## 2015-02-12 DIAGNOSIS — Z789 Other specified health status: Secondary | ICD-10-CM

## 2015-02-12 DIAGNOSIS — R490 Dysphonia: Secondary | ICD-10-CM

## 2015-02-12 DIAGNOSIS — R1310 Dysphagia, unspecified: Secondary | ICD-10-CM

## 2015-02-12 DIAGNOSIS — I69351 Hemiplegia and hemiparesis following cerebral infarction affecting right dominant side: Secondary | ICD-10-CM | POA: Diagnosis not present

## 2015-02-12 DIAGNOSIS — I2510 Atherosclerotic heart disease of native coronary artery without angina pectoris: Secondary | ICD-10-CM

## 2015-02-12 DIAGNOSIS — K5900 Constipation, unspecified: Secondary | ICD-10-CM

## 2015-02-12 DIAGNOSIS — Z85820 Personal history of malignant melanoma of skin: Secondary | ICD-10-CM | POA: Diagnosis not present

## 2015-02-12 DIAGNOSIS — R29898 Other symptoms and signs involving the musculoskeletal system: Secondary | ICD-10-CM | POA: Insufficient documentation

## 2015-02-12 DIAGNOSIS — Z7901 Long term (current) use of anticoagulants: Secondary | ICD-10-CM | POA: Diagnosis not present

## 2015-02-12 DIAGNOSIS — C419 Malignant neoplasm of bone and articular cartilage, unspecified: Secondary | ICD-10-CM

## 2015-02-12 DIAGNOSIS — I6330 Cerebral infarction due to thrombosis of unspecified cerebral artery: Secondary | ICD-10-CM

## 2015-02-12 LAB — CBC
HEMATOCRIT: 39.7 % (ref 39.0–52.0)
Hemoglobin: 13.5 g/dL (ref 13.0–17.0)
MCH: 31.5 pg (ref 26.0–34.0)
MCHC: 34 g/dL (ref 30.0–36.0)
MCV: 92.8 fL (ref 78.0–100.0)
Platelets: 110 10*3/uL — ABNORMAL LOW (ref 150–400)
RBC: 4.28 MIL/uL (ref 4.22–5.81)
RDW: 16.3 % — ABNORMAL HIGH (ref 11.5–15.5)
WBC: 2.6 10*3/uL — AB (ref 4.0–10.5)

## 2015-02-12 LAB — BASIC METABOLIC PANEL
Anion gap: 6 (ref 5–15)
BUN: 10 mg/dL (ref 6–20)
CO2: 30 mmol/L (ref 22–32)
Calcium: 8.4 mg/dL — ABNORMAL LOW (ref 8.9–10.3)
Chloride: 102 mmol/L (ref 101–111)
Creatinine, Ser: 0.89 mg/dL (ref 0.61–1.24)
GFR calc Af Amer: >60 mL/min (ref >60)
GFR calc non Af Amer: >60 mL/min (ref >60)
Glucose, Bld: 135 mg/dL — ABNORMAL HIGH (ref 65–99)
Potassium: 3.5 mmol/L (ref 3.5–5.1)
Sodium: 138 mmol/L (ref 135–145)

## 2015-02-12 LAB — HEMOGLOBIN A1C
Hgb A1c MFr Bld: 5.7 % — ABNORMAL HIGH (ref 4.8–5.6)
Mean Plasma Glucose: 117 mg/dL

## 2015-02-12 MED ORDER — LEVOTHYROXINE SODIUM 25 MCG PO TABS
25.0000 ug | ORAL_TABLET | Freq: Every day | ORAL | Status: DC
  Administered 2015-02-13 – 2015-02-21 (×9): 25 ug via ORAL
  Filled 2015-02-12 (×10): qty 1

## 2015-02-12 MED ORDER — ASPIRIN 300 MG RE SUPP
300.0000 mg | Freq: Every day | RECTAL | Status: DC
  Filled 2015-02-12 (×10): qty 1

## 2015-02-12 MED ORDER — ONDANSETRON HCL 4 MG/2ML IJ SOLN: 4.0000 mg | Freq: Four times a day (QID) | INTRAMUSCULAR | Status: DC | PRN

## 2015-02-12 MED ORDER — RESOURCE THICKENUP CLEAR PO POWD
ORAL | Status: DC | PRN
  Filled 2015-02-12: qty 125

## 2015-02-12 MED ORDER — ACETAMINOPHEN 325 MG PO TABS: 650.0000 mg | ORAL_TABLET | ORAL | Status: DC | PRN

## 2015-02-12 MED ORDER — ASPIRIN EC 325 MG PO TBEC: 325.0000 mg | DELAYED_RELEASE_TABLET | Freq: Every day | ORAL | Status: DC

## 2015-02-12 MED ORDER — METHOCARBAMOL 500 MG PO TABS
500.0000 mg | ORAL_TABLET | Freq: Four times a day (QID) | ORAL | Status: DC | PRN
  Administered 2015-02-15: 500 mg via ORAL
  Filled 2015-02-12: qty 1

## 2015-02-12 MED ORDER — WARFARIN SODIUM 2 MG PO TABS
2.0000 mg | ORAL_TABLET | Freq: Every day | ORAL | Status: DC
  Administered 2015-02-12 – 2015-02-20 (×9): 2 mg via ORAL
  Filled 2015-02-12 (×10): qty 1

## 2015-02-12 MED ORDER — ASPIRIN 325 MG PO TABS
325.0000 mg | ORAL_TABLET | Freq: Every day | ORAL | Status: DC
  Administered 2015-02-13 – 2015-02-21 (×9): 325 mg via ORAL
  Filled 2015-02-12 (×10): qty 1

## 2015-02-12 MED ORDER — PNEUMOCOCCAL VAC POLYVALENT 25 MCG/0.5ML IJ INJ: 0.5000 mL | INJECTION | INTRAMUSCULAR | Status: DC

## 2015-02-12 MED ORDER — HYDROCODONE-ACETAMINOPHEN 5-325 MG PO TABS: 1.0000 | ORAL_TABLET | Freq: Four times a day (QID) | ORAL | Status: DC | PRN

## 2015-02-12 MED ORDER — LENALIDOMIDE 25 MG PO CAPS: 25.0000 mg | ORAL_CAPSULE | ORAL | Status: DC

## 2015-02-12 MED ORDER — TAMSULOSIN HCL 0.4 MG PO CAPS
0.4000 mg | ORAL_CAPSULE | Freq: Every day | ORAL | Status: DC
  Administered 2015-02-13 – 2015-02-21 (×9): 0.4 mg via ORAL
  Filled 2015-02-12 (×11): qty 1

## 2015-02-12 MED ORDER — AMOXICILLIN-POT CLAVULANATE 875-125 MG PO TABS
1.0000 | ORAL_TABLET | Freq: Two times a day (BID) | ORAL | Status: AC
  Administered 2015-02-12 – 2015-02-16 (×9): 1 via ORAL
  Filled 2015-02-12 (×10): qty 1

## 2015-02-12 MED ORDER — DEXAMETHASONE 6 MG PO TABS
40.0000 mg | ORAL_TABLET | ORAL | Status: DC
  Administered 2015-02-14: 40 mg via ORAL
  Filled 2015-02-12 (×2): qty 1

## 2015-02-12 MED ORDER — AMOXICILLIN-POT CLAVULANATE 875-125 MG PO TABS: 1.0000 | ORAL_TABLET | Freq: Two times a day (BID) | ORAL | Status: DC

## 2015-02-12 MED ORDER — LENALIDOMIDE 15 MG PO CAPS: 25.0000 mg | ORAL_CAPSULE | ORAL | Status: DC

## 2015-02-12 MED ORDER — LENALIDOMIDE 25 MG PO CAPS
25.0000 mg | ORAL_CAPSULE | Freq: Every day | ORAL | Status: DC
  Administered 2015-02-12 – 2015-02-20 (×9): 25 mg via ORAL
  Filled 2015-02-12 (×7): qty 1

## 2015-02-12 MED ORDER — LENALIDOMIDE 25 MG PO CAPS: 25.0000 mg | ORAL_CAPSULE | Freq: Every day | ORAL | Status: DC

## 2015-02-12 MED ORDER — NONFORMULARY OR COMPOUNDED ITEM: 25.0000 mg | Freq: Every day | Status: DC

## 2015-02-12 MED ORDER — ACETAMINOPHEN 650 MG RE SUPP: 650.0000 mg | RECTAL | Status: DC | PRN

## 2015-02-12 MED ORDER — HEPARIN SODIUM (PORCINE) 5000 UNIT/ML IJ SOLN: 5000.0000 [IU] | Freq: Three times a day (TID) | INTRAMUSCULAR | Status: DC

## 2015-02-12 MED ORDER — SORBITOL 70 % SOLN: 30.0000 mL | Freq: Every day | Status: DC | PRN

## 2015-02-12 MED ORDER — VITAMIN D3 25 MCG (1000 UNIT) PO TABS
1000.0000 [IU] | ORAL_TABLET | Freq: Every day | ORAL | Status: DC
  Administered 2015-02-13 – 2015-02-21 (×9): 1000 [IU] via ORAL
  Filled 2015-02-12 (×11): qty 1

## 2015-02-12 MED ORDER — ACYCLOVIR 400 MG PO TABS
400.0000 mg | ORAL_TABLET | Freq: Two times a day (BID) | ORAL | Status: DC
  Administered 2015-02-12 – 2015-02-21 (×18): 400 mg via ORAL
  Filled 2015-02-12 (×21): qty 1

## 2015-02-12 MED ORDER — WARFARIN - PHYSICIAN DOSING INPATIENT
Freq: Every day | Status: DC
  Administered 2015-02-12: 18:00:00

## 2015-02-12 MED ORDER — PANTOPRAZOLE SODIUM 40 MG PO TBEC
40.0000 mg | DELAYED_RELEASE_TABLET | Freq: Every day | ORAL | Status: DC
  Administered 2015-02-13 – 2015-02-21 (×9): 40 mg via ORAL
  Filled 2015-02-12 (×11): qty 1

## 2015-02-12 MED ORDER — ONDANSETRON HCL 4 MG PO TABS: 4.0000 mg | ORAL_TABLET | Freq: Four times a day (QID) | ORAL | Status: DC | PRN

## 2015-02-12 NOTE — Progress Notes (Addendum)
STROKE TEAM PROGRESS NOTE   HISTORY Jeremiah Owens is a 81 y.o. male with a history of multiple myeloma treated with Revlamid who presents 02/10/2015 with 2 days of right leg weakness and difficulty speaking. He states that this started quite abruptly. Prior to this, he did not have any problems with his speech either. He states that he has difficulty with finding his words, and indeed it is difficult getting history from him because of some word finding difficulty. He was LKW: 7/8. Patient was not administered TPA secondary to being out of the treatment window. He was admitted for further evaluation and treatment.   SUBJECTIVE (INTERVAL HISTORY) His wife and echo tech are at the bedside.  Overall he feels his condition is stable.    OBJECTIVE Temp:  [98.1 F (36.7 C)-98.6 F (37 C)] 98.3 F (36.8 C) (07/11 0501) Pulse Rate:  [64-77] 64 (07/11 0501) Cardiac Rhythm:  [-] Normal sinus rhythm (07/10 2000) Resp:  [16-18] 18 (07/11 0501) BP: (107-132)/(48-62) 129/53 mmHg (07/11 0501) SpO2:  [95 %-98 %] 96 % (07/11 0501)   Recent Labs Lab 02/10/15 2202  GLUCAP 112*    Recent Labs Lab 02/07/15 1009 02/10/15 2209  NA 140 138  K 4.1 3.8  CL  --  103  CO2 29 27  GLUCOSE 91 111*  BUN 15.2 25*  CREATININE 0.9 1.21  CALCIUM 9.2 9.0    Recent Labs Lab 02/07/15 1009 02/10/15 2209  AST 20 21  ALT 21 19  ALKPHOS 63 56  BILITOT 0.62 0.7  PROT 5.7* 5.9*  ALBUMIN 3.5 3.5    Recent Labs Lab 02/07/15 1009 02/10/15 2209  WBC 3.1* 2.6*  NEUTROABS 2.6 1.6*  HGB 12.3* 11.8*  HCT 36.3* 35.3*  MCV 95.2 94.9  PLT 135* 124*    Recent Labs Lab 02/10/15 2209  CKTOTAL 60    Recent Labs  02/10/15 2209  LABPROT 14.6  INR 1.12    Recent Labs  02/10/15 2355  COLORURINE YELLOW  LABSPEC 1.018  PHURINE 6.5  GLUCOSEU NEGATIVE  HGBUR NEGATIVE  BILIRUBINUR NEGATIVE  KETONESUR NEGATIVE  PROTEINUR NEGATIVE  UROBILINOGEN 0.2  NITRITE NEGATIVE  LEUKOCYTESUR NEGATIVE        Component Value Date/Time   CHOL 165 02/11/2015 1254   TRIG 96 02/11/2015 1254   HDL 70 02/11/2015 1254   CHOLHDL 2.4 02/11/2015 1254   VLDL 19 02/11/2015 1254   LDLCALC 76 02/11/2015 1254   No results found for: HGBA1C No results found for: LABOPIA, COCAINSCRNUR, LABBENZ, AMPHETMU, THCU, LABBARB  No results for input(s): ETH in the last 168 hours.   Dg Chest 2 View 02/11/2015   1. Subtle opacity in the right upper lobe, potentially pneumonia. Recommend 3-4 week follow-up after treatment. 2. Hypoventilation.     Ct Head Wo Contrast 02/11/2015   1. No intracranial hemorrhage or definite infarct. 2. Moderate chronic small vessel disease, which could obscure a recent white matter infarct.     MRI HEAD 02/11/2015   Acute 18 x 9 mm LEFT basal ganglia lacunar infarct.  Involutional changes. Moderate white matter changes compatible chronic small vessel ischemic disease. Old RIGHT caudate lacunar infarct.    MRA HEAD 02/11/2015   No acute large vessel occlusion or high-grade stenosis.  Mild luminal irregularity intracranial vessels compatible with atherosclerosis.   Electronically Signed   By: Elon Alas M.D.   On: 02/11/2015 06:23   Carotid Doppler  There is 1-39% bilateral ICA stenosis. Vertebral artery flow is antegrade.  Tortuous mid-distal ICA vessels, bilaterally.  2D Echocardiogram  Normal LV function; grade 1 diastolic dysfunction; mild LAE;calcified aortic valve with very mild AS (peak velocity 2.1 m/s);trace AI and MR.  PHYSICAL EXAM Pleasant elderly caucasian male not in distress. . Afebrile. Head is nontraumatic. Neck is supple without bruit.    Cardiac exam no murmur or gallop. Lungs are clear to auscultation. Distal pulses are well felt. Neurological Exam :  Awake alert oriented 2. Slight diminished recall. Speech is mildly slurred but can be understood. No aphasia. Follows commands well. Extraocular movements are full range without nystagmus. Blinks to threat  bilaterally. Pupils are equal reactive. Fundi were not visualized. Mild right lower facial weakness. Tongue midline. Motor system exam no upper or lower extremity drift. Symmetric and equal strength. Fine finger movements slightly diminished on the right compared to the left orbits left over right upper extremity.  . Touch pinprick sensation are preserved bilaterally. Plantars are both downgoing. Gait was not tested. ASSESSMENT/PLAN [name] is a 81 y.o. 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 He did not receive IV t-PA due to late presentation.   Stroke:  Dominant left basal ganglia lacunar infarct secondary to small vessel disease source. Potentially worsened by chemo   MRI  L basal ganglia lacunar infarct  MRA  No significant stenosis   Carotid Doppler  No significant stenosis   2D Echo  No source of embolus   LDL 76  HgbA1c pending  Heparin 5000 units sq tid for VTE prophylaxis  DIET DYS 3 Room service appropriate?: Yes; Fluid consistency:: Nectar Thick  warfarin prior to admission (added 2/24 for prophylaxis by PA at cancer center), now on aspirin 325 mg orally every day  Ongoing aggressive stroke risk factor management  Therapy recommendations:  CIR  Disposition:  Pending. CIR consult in place  Hyperlipidemia  Home meds:  No statin   LDL 76, goal < 70  Consider adding a low dose statin - will not add at this time given multiple myeloma and ongoing chemo. Defer to oncology.  Other Stroke Risk Factors  Advanced age  Other Active Problems  Multiple myeloma with spinal metastasis, chemo currently on hold.  hypothryoidism  BPH  Right upper lobe opacity - follow-up recommended  MM with plasmacytoma at the T5 vertebrae as well as epidural tumor status  post resection by neurosurgery  Iodine allergy  Hospital day # Lilly Buchanan for Pager information 02/12/2015 1:02 PM  I have personally examined this patient, reviewed notes, independently viewed imaging studies, participated in medical decision making and plan of care. I have made any additions or clarifications directly to the above note. Agree with note above.  He presented with dysarthria due to left brain subcortical infarct etiology likely small vessel disease. He remains at risk for neurological worsening, recurrent stroke, TIA and needs ongoing stroke evaluation and aggressive risk factor control. Plan to mobilize out of bed with physical therapy and is safe may be able to discharge him later today after echo results. Aspirin for stroke prevention. Discussed with patient and wife at the bedside and answered questions.  Antony Contras, MD Medical Director [facility] Pager: [phone] 02/12/2015 2:06 PM    To contact Stroke Continuity provider, please refer to [email or web address] After hours, contact  General Neurology

## 2015-02-12 NOTE — Interval H&P Note (Signed)
Jeremiah Owens was admitted today to Inpatient Rehabilitation with the diagnosis of left basal ganglia infarction.  The patient's history has been reviewed, patient examined, and there is no change in status.  Patient continues to be appropriate for intensive inpatient rehabilitation.  I have reviewed the patient's chart and labs.  Questions were answered to the patient's satisfaction. The PAPE has been reviewed and assessment remains appropriate.  Jeremiah Owens T 02/12/2015, 5:50 PM

## 2015-02-12 NOTE — H&P (View-Only) (Signed)
  Physical Medicine and Rehabilitation Admission H&P    Chief Complaint  Patient presents with  . Extremity Weakness  : HPI: Jeremiah Owens is a 81 y.o. right handed male with history of multiple myeloma on chemotherapy on Revlimid, radiation therapy for spinal metastasis 08/21/2014-09/04/2014 as well as 11/29/2014-12/12/2014 per Dr. Moody and Dr. Mohammed. Chronic Fixed dose Coumadin while maintained on Revlimid Well known to rehabilitation services from admission November 2015 for thoracic spine decompression and fusion related to multiple myeloma. Patient lives with his wife used a rolling walker to ambulate short community distances prior to admission. Presented 02/11/2015 with right sided weakness and slurred speech 2 days. MRI of the brain showed acute left basal ganglia lacunar infarction as well as old right caudate lacunar infarct. MRA of the head with no large vessel occlusion or stenosis. Carotid Doppler but no ICA stenosis. Echocardiogram with ejection fraction of 60% grade 1 diastolic dysfunction. Patient did not receive TPA. Neurology consulted presently on aspirin for CVA prophylaxis and fixed dose Coumadin resumed at 2 mg daily as prior to admission. Suspect aspiration pneumonia currently maintained on Unasyn changed to Augmentin 02/12/2015 5 days. Dysphagia #3 nectar thick liquid diet. He continues to receive weekly chemotherapy at [place] [facility]. Physical therapy evaluation completed 02/12/2015 with recommendations of physical medicine rehabilitation consult. Patient was admitted for comprehensive rehabilitation program  ROS Review of Systems  Constitutional: Negative for fever and chills.  HENT: Positive for hearing loss.  Eyes: Negative for double vision.  Respiratory: Negative for cough and hemoptysis.  Cardiovascular: Negative for chest pain and palpitations.  Gastrointestinal: Positive for constipation.  Genitourinary: Positive for urgency.  Musculoskeletal:  Positive for myalgias, back pain and joint pain.  Skin: Negative for rash.  Neurological: Positive for speech change and weakness. Negative for headaches.  Psychiatric/Behavioral: Positive for memory loss.   Anxiety    Past Medical History  Diagnosis Date  . Compression fracture   . Allergy   . Anxiety   . Thyroid disease   . S/P radiation therapy 08/21/14-09/04/14    T4-6 25Gy/10fx  . S/P radiation therapy 11/29/14-12/12/14    rt prox humerus/shoulder/scapula 25Gy/10fx  . Encounter for antineoplastic chemotherapy 01/31/2015  . Melanoma   . Bone cancer     t_spine T-5  . Prostate cancer 2002  . Skin cancer 1994    melanoma  right neck   . Multiple myeloma    Past Surgical History  Procedure Laterality Date  . Hernia repair    . Posterior cervical fusion/foraminotomy N/A 06/17/2014    Procedure: Thoracic five laminectomy for epidural tumor resection Thoracic 4-7 posterior lateral arthrodesis, segmental pedicle screw fixation.;  Surgeon: Kyle Cabbell, MD;  Location: [facility];  Service: Neurosurgery;  Laterality: N/A;   Family History  Problem Relation Age of Onset  . Cancer Mother     breast   Social History:  reports that he has never smoked. He does not have any smokeless tobacco history on file. He reports that he does not drink alcohol or use illicit drugs. Allergies:  Allergies  Allergen Reactions  . Iodine Itching    Patient had a reaction 20 years ago and can't remember what kind of reaction he had.  Patient was given decadron in the ER and had an Angio ordered and did test with no problems.  Bottom line "patient needs to pre-medicated before any IV dye is given".   Medications Prior to Admission  Medication Sig Dispense Refill  . acyclovir (ZOVIRAX) 400   MG tablet TAKE 1 TABLET BY MOUTH TWICE A DAY 60 tablet 2  . Calcium Carbonate-Vitamin D (CALTRATE 600+D PO) Take 600 mg by mouth 2 (two) times daily.    . cholecalciferol (VITAMIN D) 1000 UNITS tablet Take  1,000 Units by mouth at bedtime.     . furosemide (LASIX) 20 MG tablet Take 1 tablet (20 mg total) by mouth daily. As needed for swelling (Patient taking differently: Take 20 mg by mouth 2 (two) times daily. As needed for swelling) 10 tablet 0  . HYDROcodone-acetaminophen (NORCO) 5-325 MG per tablet Take 1 tablet by mouth every 6 (six) hours as needed for moderate pain. 60 tablet 0  . HYDROCORTISONE, TOPICAL, 2.5 % SOLN Apply 1 application topically 3 (three) times daily as needed (for itching).     . lenalidomide (REVLIMID) 25 MG capsule Take 1 capsule (25 mg total) by mouth daily. Take one capsule ( 25 mg) by mouth for 21 days every 4 weeks-01/31/15 Authorization number=[id number]  adult male 21 capsule 0  . levothyroxine (SYNTHROID, LEVOTHROID) 25 MCG tablet Take 1 tablet (25 mcg total) by mouth daily. 30 tablet 4  . methocarbamol (ROBAXIN) 500 MG tablet TAKE 1 TABLET (500 MG TOTAL) BY MOUTH EVERY 6 (SIX) HOURS AS NEEDED FOR MUSCLE SPASMS. 60 tablet 0  . Multiple Vitamins-Minerals (CENTRUM SILVER PO) Take 1 tablet by mouth daily.    . omeprazole (PRILOSEC) 40 MG capsule Take 40 mg by mouth daily.    . Polyvinyl Alcohol-Povidone (REFRESH OP) Apply 1 drop to eye 4 (four) times daily as needed (dry/irritated eyes).     . potassium chloride (K-DUR,KLOR-CON) 10 MEQ tablet Take 10 mEq by mouth daily.    . prochlorperazine (COMPAZINE) 10 MG tablet Take 1 tablet (10 mg total) by mouth every 6 (six) hours as needed for nausea or vomiting. 30 tablet 0  . sennosides-docusate sodium (SENOKOT-S) 8.6-50 MG tablet Take 1-2 tablets by mouth 2 (two) times daily as needed for constipation. Takes 1-2 tabs daily    . tamsulosin (FLOMAX) 0.4 MG CAPS capsule Take 1 capsule (0.4 mg total) by mouth at bedtime. 30 capsule 3  . warfarin (COUMADIN) 2 MG tablet Take 1 tablet (2 mg total) by mouth daily. 30 tablet 3  . dexamethasone (DECADRON) 4 MG tablet 10 TAB EVERY WEEK, START WITH CHEMO 40 tablet 4  . ranitidine (ZANTAC) 150  MG tablet Take 150 mg by mouth daily as needed for heartburn.   11    Home: Home Living Family/patient expects to be discharged to:: Private residence Living Arrangements: Spouse/significant other Available Help at Discharge: Family, Available 24 hours/day Type of Home: House Home Access: Ramped entrance Home Layout: One level Bathroom Shower/Tub: Walk-in shower Bathroom Toilet: Handicapped height Home Equipment: Walker - 2 wheels, Walker - 4 wheels, Cane - single point, Bedside commode, Wheelchair - manual, Grab bars - toilet, Grab bars - tub/shower, Shower seat - built in   Functional History: Prior Function Level of Independence: Needs assistance Gait / Transfers Assistance Needed: used a RW, able to amb short community distances ADL's / Homemaking Assistance Needed: minimal assist for dressing/bathing occasionally  Functional Status:  Mobility: Bed Mobility Overal bed mobility: Needs Assistance Bed Mobility: Supine to Sit Supine to sit: Mod assist General bed mobility comments: in chair.  Transfers Overall transfer level: Needs assistance Equipment used: Rolling walker (2 wheeled) Transfers: Sit to/from Stand Sit to Stand: Min assist General transfer comment: cues for hand placement.  Ambulation/Gait Ambulation/Gait assistance: Min assist (+1   for chair follow) Ambulation Distance (Feet): 75 Feet Assistive device: Rolling walker (2 wheeled) Gait Pattern/deviations: Decreased stride length, Step-through pattern, Decreased weight shift to right, Narrow base of support General Gait Details: max v/c's to increase base of support as pt was amb with narrow base of support/near crossover. increased dependence on UEs Gait velocity interpretation: Below normal speed for age/gender    ADL: ADL Overall ADL's : Needs assistance/impaired Eating/Feeding: Sitting, Supervision/ safety Grooming: Wash/dry hands, Set up, Sitting Upper Body Bathing: Supervision/ safety, Set up,  Sitting Lower Body Bathing: Moderate assistance, Sit to/from stand Upper Body Dressing : Sitting, Minimal assistance Lower Body Dressing: Moderate assistance, Sit to/from stand Toilet Transfer: Minimal assistance, Moderate assistance, Ambulation, RW, Comfort height toilet Toileting- Clothing Manipulation and Hygiene: Moderate assistance, Sit to/from stand General ADL Comments: Pt with some difficulty manuevering throughout tight space with walker into bathroom and around door.  Noted BM smudege on chair pad so assisted pt to the bathroom to have BM and for hygiene/cleanup. Pt able to manage wiping/hygiene from seated position with increased time. Cues for hand placement with functional transfers and for better placement of hands on walker for safety. Pt able to cross LEs up for donning/doffing socks.   Cognition: Cognition Overall Cognitive Status: Within Functional Limits for tasks assessed Orientation Level: Oriented X4 Cognition Arousal/Alertness: Awake/alert Behavior During Therapy: WFL for tasks assessed/performed Overall Cognitive Status: Within Functional Limits for tasks assessed  Physical Exam: Blood pressure 130/54, pulse 73, temperature 98.1 F (36.7 C), temperature source Oral, resp. rate 16, height 5' 9" (1.753 m), weight 73.3 kg (161 lb 9.6 oz), SpO2 98 %. Physical Exam Constitutional: He is oriented to person, place, and time.  HENT:  Oral mucosa pink, moist, dentition good Eyes: EOM are normal.  Neck: Normal range of motion. Neck supple. No thyromegaly present.  Cardiovascular: Normal rate and regular rhythm. no murmurs, rubs Respiratory: Effort normal and breath sounds normal. No respiratory distress.  GI: Soft. Bowel sounds are normal. He exhibits no distension.  Neurological: He is alert and oriented to person, place, and time.  Speech is mildly slurred but intelligible. Right central 7. He is oriented to person place and date. Occasional word finding deficits and  processing delays. RUE: 4/5 prox to distal. Decreased FMC. RLE: 3+hf, 4/5 ke, adf/apf. LLE: 4+/5. Decreased proprioception in both legs (chronic)  Skin: Skin is warm and dry.  Psychiatric: He has a normal mood and affect. His behavior is normal   Results for orders placed or performed during the hospital encounter of 02/10/15 (from the past 48 hour(s))  CBG monitoring, ED     Status: Abnormal   Collection Time: 02/10/15 10:02 PM  Result Value Ref Range   Glucose-Capillary 112 (H) 65 - 99 mg/dL   Comment 1 Notify RN   CBC     Status: Abnormal   Collection Time: 02/10/15 10:09 PM  Result Value Ref Range   WBC 2.6 (L) 4.0 - 10.5 K/uL   RBC 3.72 (L) 4.22 - 5.81 MIL/uL   Hemoglobin 11.8 (L) 13.0 - 17.0 g/dL   HCT 35.3 (L) 39.0 - 52.0 %   MCV 94.9 78.0 - 100.0 fL   MCH 31.7 26.0 - 34.0 pg   MCHC 33.4 30.0 - 36.0 g/dL   RDW 16.3 (H) 11.5 - 15.5 %   Platelets 124 (L) 150 - 400 K/uL  Differential     Status: Abnormal   Collection Time: 02/10/15 10:09 PM  Result Value Ref Range     Neutrophils Relative % 61 43 - 77 %   Neutro Abs 1.6 (L) 1.7 - 7.7 K/uL   Lymphocytes Relative 18 12 - 46 %   Lymphs Abs 0.5 (L) 0.7 - 4.0 K/uL   Monocytes Relative 14 (H) 3 - 12 %   Monocytes Absolute 0.4 0.1 - 1.0 K/uL   Eosinophils Relative 6 (H) 0 - 5 %   Eosinophils Absolute 0.1 0.0 - 0.7 K/uL   Basophils Relative 1 0 - 1 %   Basophils Absolute 0.0 0.0 - 0.1 K/uL  Comprehensive metabolic panel     Status: Abnormal   Collection Time: 02/10/15 10:09 PM  Result Value Ref Range   Sodium 138 135 - 145 mmol/L   Potassium 3.8 3.5 - 5.1 mmol/L   Chloride 103 101 - 111 mmol/L   CO2 27 22 - 32 mmol/L   Glucose, Bld 111 (H) 65 - 99 mg/dL   BUN 25 (H) 6 - 20 mg/dL   Creatinine, Ser 1.21 0.61 - 1.24 mg/dL   Calcium 9.0 8.9 - 10.3 mg/dL   Total Protein 5.9 (L) 6.5 - 8.1 g/dL   Albumin 3.5 3.5 - 5.0 g/dL   AST 21 15 - 41 U/L   ALT 19 17 - 63 U/L   Alkaline Phosphatase 56 38 - 126 U/L   Total Bilirubin 0.7 0.3  - 1.2 mg/dL   GFR calc non Af Amer 54 (L) >60 mL/min   GFR calc Af Amer >60 >60 mL/min    Comment: (NOTE) The eGFR has been calculated using the CKD EPI equation. This calculation has not been validated in all clinical situations. eGFR's persistently <60 mL/min signify possible Chronic Kidney Disease.    Anion gap 8 5 - 15  Protime-INR     Status: None   Collection Time: 02/10/15 10:09 PM  Result Value Ref Range   Prothrombin Time 14.6 11.6 - 15.2 seconds   INR 1.12 0.00 - 1.49  CK     Status: None   Collection Time: 02/10/15 10:09 PM  Result Value Ref Range   Total CK 60 49 - 397 U/L  I-stat troponin, ED (not at [facility])     Status: None   Collection Time: 02/10/15 10:26 PM  Result Value Ref Range   Troponin i, poc 0.00 0.00 - 0.08 ng/mL   Comment 3            Comment: Due to the release kinetics of cTnI, a negative result within the first hours of the onset of symptoms does not rule out myocardial infarction with certainty. If myocardial infarction is still suspected, repeat the test at appropriate intervals.   Urinalysis, Routine w reflex microscopic (not at [facility])     Status: None   Collection Time: 02/10/15 11:55 PM  Result Value Ref Range   Color, Urine YELLOW YELLOW   APPearance CLEAR CLEAR   Specific Gravity, Urine 1.018 1.005 - 1.030   pH 6.5 5.0 - 8.0   Glucose, UA NEGATIVE NEGATIVE mg/dL   Hgb urine dipstick NEGATIVE NEGATIVE   Bilirubin Urine NEGATIVE NEGATIVE   Ketones, ur NEGATIVE NEGATIVE mg/dL   Protein, ur NEGATIVE NEGATIVE mg/dL   Urobilinogen, UA 0.2 0.0 - 1.0 mg/dL   Nitrite NEGATIVE NEGATIVE   Leukocytes, UA NEGATIVE NEGATIVE    Comment: MICROSCOPIC NOT DONE ON URINES WITH NEGATIVE PROTEIN, BLOOD, LEUKOCYTES, NITRITE, OR GLUCOSE <1000 mg/dL.  Lipid panel     Status: None   Collection Time: 02/11/15 12:54 PM  Result Value Ref Range   Cholesterol 165 0 - 200 mg/dL   Triglycerides 96 <150 mg/dL   HDL 70 >40 mg/dL   Total CHOL/HDL Ratio 2.4 RATIO    VLDL 19 0 - 40 mg/dL   LDL Cholesterol 76 0 - 99 mg/dL    Comment:        Total Cholesterol/HDL:CHD Risk Coronary Heart Disease Risk Table                     Men   Women  1/2 Average Risk   3.4   3.3  Average Risk       5.0   4.4  2 X Average Risk   9.6   7.1  3 X Average Risk  23.4   11.0        Use the calculated Patient Ratio above and the CHD Risk Table to determine the patient's CHD Risk.        ATP III CLASSIFICATION (LDL):  <100     mg/dL   Optimal  100-129  mg/dL   Near or Above                    Optimal  130-159  mg/dL   Borderline  160-189  mg/dL   High  >190     mg/dL   Very High   Hemoglobin A1c     Status: Abnormal   Collection Time: 02/11/15  3:39 PM  Result Value Ref Range   Hgb A1c MFr Bld 5.7 (H) 4.8 - 5.6 %    Comment: (NOTE)         Pre-diabetes: 5.7 - 6.4         Diabetes: >6.4         Glycemic control for adults with diabetes: <7.0    Mean Plasma Glucose 117 mg/dL    Comment: (NOTE) Performed At: [facility] [place] [address] [place], [place] [id number] [place] William F MD Ph:[phone]   Basic metabolic panel     Status: Abnormal   Collection Time: 02/12/15 10:50 AM  Result Value Ref Range   Sodium 138 135 - 145 mmol/L   Potassium 3.5 3.5 - 5.1 mmol/L   Chloride 102 101 - 111 mmol/L   CO2 30 22 - 32 mmol/L   Glucose, Bld 135 (H) 65 - 99 mg/dL   BUN 10 6 - 20 mg/dL   Creatinine, Ser 0.89 0.61 - 1.24 mg/dL   Calcium 8.4 (L) 8.9 - 10.3 mg/dL   GFR calc non Af Amer >60 >60 mL/min   GFR calc Af Amer >60 >60 mL/min    Comment: (NOTE) The eGFR has been calculated using the CKD EPI equation. This calculation has not been validated in all clinical situations. eGFR's persistently <60 mL/min signify possible Chronic Kidney Disease.    Anion gap 6 5 - 15  CBC     Status: Abnormal   Collection Time: 02/12/15 10:50 AM  Result Value Ref Range   WBC 2.6 (L) 4.0 - 10.5 K/uL    Comment: SPECIMEN CHECKED FOR CLOTS REPEATED TO VERIFY     RBC 4.28 4.22 - 5.81 MIL/uL   Hemoglobin 13.5 13.0 - 17.0 g/dL   HCT 39.7 39.0 - 52.0 %   MCV 92.8 78.0 - 100.0 fL   MCH 31.5 26.0 - 34.0 pg   MCHC 34.0 30.0 - 36.0 g/dL   RDW 16.3 (H) 11.5 - 15.5 %   Platelets 110 (L) 150 -   400 K/uL    Comment: PLATELET COUNT CONFIRMED BY SMEAR REPEATED TO VERIFY    Dg Chest 2 View  02/11/2015   CLINICAL DATA:  Weakness  EXAM: CHEST  2 VIEW  COMPARISON:  06/17/2014  FINDINGS: Subpleural opacity in the right upper lobe is asymmetric and could reflect consolidation/pneumonia. This was not seen on scout from 10/17/2014 thoracic spine CT, arguing against neoplastic process. Chronic cardiomegaly. Negative aortic and hilar contours. Ventilation is low.  Multiple myeloma with upper thoracic posterior fixation.  IMPRESSION: 1. Subtle opacity in the right upper lobe, potentially pneumonia. Recommend 3-4 week follow-up after treatment. 2. Hypoventilation.   Electronically Signed   By: Monte Fantasia M.D.   On: 02/11/2015 01:36   Ct Head Wo Contrast  02/11/2015   CLINICAL DATA:  Difficulty with speech with dizziness. Chronic right leg weakness.  EXAM: CT HEAD WITHOUT CONTRAST  TECHNIQUE: Contiguous axial images were obtained from the base of the skull through the vertex without intravenous contrast.  COMPARISON:  None.  FINDINGS: Skull and Sinuses:Negative for fracture or destructive process. The mastoids, middle ears, and imaged paranasal sinuses are clear.  Orbits: No acute abnormality.  Brain: No evidence of acute gray matter infarction, hemorrhage, hydrocephalus, or mass lesion/mass effect. There is generalized brain atrophy with ventriculomegaly, mild. Chronic small-vessel disease with patchy ischemic gliosis throughout the bilateral cerebral white matter. There is ischemic change in the right putamen, likely chronic based on history.  IMPRESSION: 1. No intracranial hemorrhage or definite infarct. 2. Moderate chronic small vessel disease, which could obscure a recent  white matter infarct.   Electronically Signed   By: Monte Fantasia M.D.   On: 02/11/2015 00:20   Mr Brain Wo Contrast  02/11/2015   CLINICAL DATA:  Acute on chronic RIGHT leg weakness, new onset garbled speech. History of melanoma, prostate cancer, stroke.  EXAM: MRI HEAD WITHOUT CONTRAST  MRA HEAD WITHOUT CONTRAST  TECHNIQUE: Multiplanar, multiecho pulse sequences of the brain and surrounding structures were obtained without intravenous contrast. Angiographic images of the head were obtained using MRA technique without contrast.  COMPARISON:  CT of the head February 10, 2015  FINDINGS: MRI HEAD FINDINGS  18 x 9 mm area of reduced diffusion in LEFT basal ganglia (lenticular striate nucleus), corresponding low ADC value consistent with acute ischemia. Corresponding mild FLAIR hyperintense signal. No susceptibility artifact to suggest hemorrhage.  Ventricles and sulci are normal for patient's age. Patchy to confluent supratentorial, pontine white matter T2 hyperintensities without midline shift, mass effect, mass lesions. Innumerable tiny T2 hyperintensities within the basal ganglia compatible with perivascular spaces. In addition, small old RIGHT caudate body lacunar infarct. No abnormal extra-axial fluid collections. Normal major intracranial vascular flow voids seen at the skull base.  Ocular globes and orbital contents are unremarkable. Mild paranasal sinus mucosal thickening without air-fluid levels. Trace LEFT mastoid effusion.  MRA HEAD FINDINGS  Anterior circulation: Normal flow related enhancement of the included cervical, petrous, cavernous and supra clinoid internal carotid arteries. Patent anterior communicating artery. Normal flow related enhancement of the anterior and middle cerebral arteries, including more distal segments.  No large vessel occlusion, high-grade stenosis, aneurysm. Mild middle cerebral artery luminal irregularity.  Posterior circulation: Codominant vertebral artery's. Basilar artery is  patent, with normal flow related enhancement of the main branch vessels. Normal flow related enhancement of the posterior cerebral arteries. Small LEFT posterior communicating artery is present.  No large vessel occlusion, high-grade stenosis, mild luminal irregularity of the posterior cerebral arteries. No aneurysm.  IMPRESSION: MRI HEAD: Acute 18 x 9 mm LEFT basal ganglia lacunar infarct.  Involutional changes. Moderate white matter changes compatible chronic small vessel ischemic disease. Old RIGHT caudate lacunar infarct.  MRA HEAD: No acute large vessel occlusion or high-grade stenosis.  Mild luminal irregularity intracranial vessels compatible with atherosclerosis.   Electronically Signed   By: Courtnay  Bloomer M.D.   On: 02/11/2015 06:23   Dg Swallowing Func-speech Pathology  02/11/2015    Objective Swallowing Evaluation:    Patient Details  Name: Fitzgerald Mamone MRN: [id number] Date of Birth: 08/14/1929  Today's Date: 02/11/2015 Time: SLP Start Time (ACUTE ONLY): 1340-SLP Stop Time (ACUTE ONLY): 1400 SLP Time Calculation (min) (ACUTE ONLY): 20 min  Past Medical History:  Past Medical History  Diagnosis Date  . Compression fracture   . Allergy   . Anxiety   . Thyroid disease   . S/P radiation therapy 08/21/14-09/04/14    T4-6 25Gy/10fx  . S/P radiation therapy 11/29/14-12/12/14    rt prox humerus/shoulder/scapula 25Gy/10fx  . Encounter for antineoplastic chemotherapy 01/31/2015  . Melanoma   . Bone cancer     t_spine T-5  . Prostate cancer 2002  . Skin cancer 1994    melanoma  right neck   . Multiple myeloma    Past Surgical History:  Past Surgical History  Procedure Laterality Date  . Hernia repair    . Posterior cervical fusion/foraminotomy N/A 06/17/2014    Procedure: Thoracic five laminectomy for epidural tumor resection  Thoracic 4-7 posterior lateral arthrodesis, segmental pedicle screw  fixation.;  Surgeon: Kyle Cabbell, MD;  Location: [facility];  Service:  Neurosurgery;  Laterality: N/A;   HPI:  Other  Pertinent Information: Jeremiah Owens is a 81 y.o. male with a  history of multiple myeloma treated with Revlamid who presents today with  2 days of right leg weakness and difficulty speaking. He states that this  started quite abruptly. Prior to this, he did not have any problems with  his speech either. He states that he has difficulty with finding his  words, and indeed it is difficult getting history from him because of some  word finding difficulty. MRI shows Acute 18 x 9 mm LEFT basal ganglia  lacunar infarct  No Data Recorded  Assessment / Plan / Recommendation CHL IP CLINICAL IMPRESSIONS 02/11/2015  Therapy Diagnosis Moderate pharyngeal phase dysphagia  Clinical Impression Pt presents with a neuromuscular dysphagia with motor  and sensory deficits due to impairment. There is delayed initiation of the  swallow, particularly dangerous with thin liquids. With all textures there  is weakness of the right lateral wall and possible decreased adduction of  the right arytenoid resulting in silent penetration during the swallow and  residuals in the right lateral channel which are silently aspriated during  and after the swallow. With a head turn right laryngeal closure duirng the  swallow improves and residuals are eliminated. Nectar thick liquids are  still warranted due to delayed timing of swallow. Recommend pt consume a  dys 3 (mechanical soft) diet with nectar thick liquids. Initially pt will  need full supervision to ensure head turn right. As pt demonstrates  independence, supervision can fade. SLP will follow for tolerance.       CHL IP TREATMENT RECOMMENDATION 02/11/2015  Treatment Recommendations Therapy as outlined in treatment plan below     CHL IP DIET RECOMMENDATION 02/11/2015  SLP Diet Recommendations (None)  Liquid Administration via (None)  Medication Administration Whole meds with puree  Compensations Other (  Comment);Small sips/bites;Slow rate  Postural Changes and/or Swallow Maneuvers (None)     CHL  IP OTHER RECOMMENDATIONS 02/11/2015  Recommended Consults (None)  Oral Care Recommendations Oral care BID  Other Recommendations Order thickener from pharmacy     No flowsheet data found.   CHL IP FREQUENCY AND DURATION 02/11/2015  Speech Therapy Frequency (ACUTE ONLY) min 2x/week  Treatment Duration 2 weeks     Pertinent Vitals/Pain NA    SLP Swallow Goals No flowsheet data found.  No flowsheet data found.    CHL IP REASON FOR REFERRAL 02/11/2015  Reason for Referral Objectively evaluate swallowing function               DeBlois, Katherene Ponto 02/11/2015, 2:16 PM    Mr Jodene Nam Head/brain Wo Cm  02/11/2015   CLINICAL DATA:  Acute on chronic RIGHT leg weakness, new onset garbled speech. History of melanoma, prostate cancer, stroke.  EXAM: MRI HEAD WITHOUT CONTRAST  MRA HEAD WITHOUT CONTRAST  TECHNIQUE: Multiplanar, multiecho pulse sequences of the brain and surrounding structures were obtained without intravenous contrast. Angiographic images of the head were obtained using MRA technique without contrast.  COMPARISON:  CT of the head February 10, 2015  FINDINGS: MRI HEAD FINDINGS  18 x 9 mm area of reduced diffusion in LEFT basal ganglia (lenticular striate nucleus), corresponding low ADC value consistent with acute ischemia. Corresponding mild FLAIR hyperintense signal. No susceptibility artifact to suggest hemorrhage.  Ventricles and sulci are normal for patient's age. Patchy to confluent supratentorial, pontine white matter T2 hyperintensities without midline shift, mass effect, mass lesions. Innumerable tiny T2 hyperintensities within the basal ganglia compatible with perivascular spaces. In addition, small old RIGHT caudate body lacunar infarct. No abnormal extra-axial fluid collections. Normal major intracranial vascular flow voids seen at the skull base.  Ocular globes and orbital contents are unremarkable. Mild paranasal sinus mucosal thickening without air-fluid levels. Trace LEFT mastoid effusion.  MRA HEAD  FINDINGS  Anterior circulation: Normal flow related enhancement of the included cervical, petrous, cavernous and supra clinoid internal carotid arteries. Patent anterior communicating artery. Normal flow related enhancement of the anterior and middle cerebral arteries, including more distal segments.  No large vessel occlusion, high-grade stenosis, aneurysm. Mild middle cerebral artery luminal irregularity.  Posterior circulation: Codominant vertebral artery's. Basilar artery is patent, with normal flow related enhancement of the main branch vessels. Normal flow related enhancement of the posterior cerebral arteries. Small LEFT posterior communicating artery is present.  No large vessel occlusion, high-grade stenosis, mild luminal irregularity of the posterior cerebral arteries. No aneurysm.  IMPRESSION: MRI HEAD: Acute 18 x 9 mm LEFT basal ganglia lacunar infarct.  Involutional changes. Moderate white matter changes compatible chronic small vessel ischemic disease. Old RIGHT caudate lacunar infarct.  MRA HEAD: No acute large vessel occlusion or high-grade stenosis.  Mild luminal irregularity intracranial vessels compatible with atherosclerosis.   Electronically Signed   By: Elon Alas M.D.   On: 02/11/2015 06:23       Medical Problem List and Plan: 1. Functional deficits secondary to left basal ganglia infarct 2.  DVT Prophylaxis/Anticoagulation: Fixed dose of Coumadin while maintained on Revlimid 3. Pain Management: Robaxin as needed 4. Dysphagia. Dysphagia #3 nectar liquids. Monitor hydration 5. Neuropsych: This patient is capable of making decisions on his own behalf. 6. Skin/Wound Care: Routine skin checks 7. Fluids/Electrolytes/Nutrition: Routine I&O with follow-up chemistries 8. History of multiple myeloma/spinal metastasis. Chemotherapy ongoing per Dr. Earlie Server weekly 9. Aspiration pneumonia. Unasyn changed to Augmentin 02/12/2015 5  days 10. BPH. Check PVRs 3. Continue  Flomax   Post Admission Physician Evaluation: 1. Functional deficits secondary  to left basal ganglia infarct. 2. Patient is admitted to receive collaborative, interdisciplinary care between the physiatrist, rehab nursing staff, and therapy team. 3. Patient's level of medical complexity and substantial therapy needs in context of that medical necessity cannot be provided at a lesser intensity of care such as a SNF. 4. Patient has experienced substantial functional loss from his/her baseline which was documented above under the "Functional History" and "Functional Status" headings.  Judging by the patient's diagnosis, physical exam, and functional history, the patient has potential for functional progress which will result in measurable gains while on inpatient rehab.  These gains will be of substantial and practical use upon discharge  in facilitating mobility and self-care at the household level. 5. Physiatrist will provide 24 hour management of medical needs as well as oversight of the therapy plan/treatment and provide guidance as appropriate regarding the interaction of the two. 6. 24 hour rehab nursing will assist with bladder management, bowel management, safety, skin/wound care, disease management, medication administration, pain management and patient education  and help integrate therapy concepts, techniques,education, etc. 7. PT will assess and treat for/with: Lower extremity strength, range of motion, stamina, balance, functional mobility, safety, adaptive techniques and equipment, NMR, cognitive perceptual rx, stroke education, family ed.   Goals are: mod I. 8. OT will assess and treat for/with: ADL's, functional mobility, safety, upper extremity strength, adaptive techniques and equipment, NMR, cognitive perceptual awareness, stroke education, family ed.   Goals are: mod I. Therapy may proceed with showering this patient. 9. SLP will assess and treat for/with: speech, swallowing, cognition,  language.  Goals are: mod I. 10. Case Management and Social Worker will assess and treat for psychological issues and discharge planning. 11. Team conference will be held weekly to assess progress toward goals and to determine barriers to discharge. 12. Patient will receive at least 3 hours of therapy per day at least 5 days per week. 13. ELOS: 7-9 days       14. Prognosis:  excellent     Meredith Staggers, MD, [facility] 02/12/2015   02/12/2015

## 2015-02-12 NOTE — Progress Notes (Signed)
PMR Admission Coordinator Pre-Admission Assessment  Patient: Jeremiah Owens is an 81 y.o., male MRN: [id number] DOB: 10/06/1929 Height: '5\' 9"'  (175.3 cm) Weight: 73.3 kg (161 lb 9.6 oz)  Insurance Information HMO: PPO: PCP: IPA: 80/20: yes OTHER: no HMO PRIMARY: Medicare a and b Policy#: [id number] Subscriber: pt Benefits: Phone #: palmetto online Name: 02/12/2015 Eff. Date: 10/02/96 Deduct: $1288 Out of Pocket Max: none Life Max: none CIR: 100% SNF: 20 full days Outpatient: 80% Co-Pay: 20% Home Health: 100% Co-Pay: none DME: 80% Co-Pay: 20% Providers: pt choice  SECONDARY: Tric[employer] for Life Policy#: [id number] Subscriber: pt  Medicaid Application Date: Case Manager:  Disability Application Date: Case Worker:   Emergency Contact Information Contact Information    Name Relation Home Work Mobile   Rodman Spouse [phone]  [phone]   Jorja Loa [phone]     Jshawn, Hurta   [phone]     Current Medical History  Patient Admitting Diagnosis: left basal ganglia infarct  History of Present Illness: : Jeremiah Owens is a 81 y.o. right handed male with history of multiple myeloma on chemotherapy, radiation therapy for spinal metastasis 08/21/2014-09/04/2014 as well as 11/29/2014-12/12/2014 per Dr. Lisbeth Renshaw and Dr. Earlie Server. Well known to rehabilitation services from admission November 2015 for thoracic spine decompression and fusion related to multiple myeloma. Patient lives with his wife used a rolling walker to ambulate short community distances prior to admission. Presented 02/11/2015 with right sided weakness and slurred speech 2 days. MRI of the brain showed acute left basal ganglia lacunar infarction as well  as old right caudate lacunar infarct. MRA of the head with no large vessel occlusion or stenosis. Carotid Doppler but no ICA stenosis. Echocardiogram pending. Patient did not receive TPA. Neurology consulted presently on aspirin for CVA prophylaxis as well as subcutaneous heparin for DVT prophylaxis. Suspect aspiration pneumonia currently maintained on Unasyn. Dysphagia #3 nectar thick liquid diet. He continues to receive weekly chemotherapy at [facility].   Total: 3 NIH    Past Medical History  Past Medical History  Diagnosis Date  . Compression fracture   . Allergy   . Anxiety   . Thyroid disease   . S/P radiation therapy 08/21/14-09/04/14    T4-6 25Gy/52f  . S/P radiation therapy 11/29/14-12/12/14    rt prox humerus/shoulder/scapula 25Gy/169f . Encounter for antineoplastic chemotherapy 01/31/2015  . Melanoma   . Bone cancer     t_spine T-5  . Prostate cancer 2002  . Skin cancer 1994    melanoma right neck   . Multiple myeloma     Family History  family history includes Cancer in his mother.  Prior Rehab/Hospitalizations:  Has the patient had major surgery during 100 days prior to admission? No Received CIR 06/2015 after back surgery OP and HH also  Current Medications   Current facility-administered medications:  . acetaminophen (TYLENOL) tablet 650 mg, 650 mg, Oral, Q4H PRN **OR** acetaminophen (TYLENOL) suppository 650 mg, 650 mg, Rectal, Q4H PRN, PrLavina HammanMD . Ampicillin-Sulbactam (UNASYN) 3 g in sodium chloride 0.9 % 100 mL IVPB, 3 g, Intravenous, 4 times per day, PrLavina HammanMD, Last Rate: 100 mL/hr at 02/12/15 1236, 3 g at 02/12/15 1236 . aspirin suppository 300 mg, 300 mg, Rectal, Daily **OR** aspirin tablet 325 mg, 325 mg, Oral, Daily, PrLavina HammanMD, 325 mg at 02/12/15 1027 . heparin injection 5,000 Units, 5,000 Units, Subcutaneous, 3 times per day, PrLavina HammanMD, 5,000 Units at  02/12/15 1446 . NONFORMULARY OR COMPOUNDED  ITEM 25 mg, 25 mg, Oral, QHS, Verlee Monte, MD, 25 mg at 02/11/15 2102 . RESOURCE THICKENUP CLEAR, , Oral, PRN, Verlee Monte, MD  Patients Current Diet: DIET DYS 3 Room service appropriate?: Yes; Fluid consistency:: Nectar Thick Diet - low sodium heart healthy  Precautions / Restrictions Precautions Precautions: Fall Restrictions Weight Bearing Restrictions: No   Has the patient had 2 or more falls or a fall with injury in the past year?No  Prior Activity Level Limited Community (1-2x/wk): Pt drives self and was I  Development worker, international aid / Paramedic Devices/Equipment: Cabin crew, Eyeglasses Home Equipment: Environmental consultant - 2 wheels, Environmental consultant - 4 wheels, Meta - single point, Bedside commode, Wheelchair - manual, Grab bars - toilet, Grab bars - tub/shower, Shower seat - built in  Prior Device Use: Indicate devices/aids used by the patient prior to current illness, exacerbation or injury? Walker  Prior Functional Level Prior Function Level of Independence: Needs assistance Gait / Transfers Assistance Needed: used a RW, able to amb short community distances ADL's / Hydrologist Assistance Needed: minimal assist for dressing/bathing occasionally  Self Care: Did the patient need help bathing, dressing, using the toilet or eating? Needed some help  Indoor Mobility: Did the patient need assistance with walking from room to room (with or without device)? Independent  Stairs: Did the patient need assistance with internal or external stairs (with or without device)? Independent  Functional Cognition: Did the patient need help planning regular tasks such as shopping or remembering to take medications? Independent  Current Functional Level Cognition  Overall Cognitive Status: Within Functional Limits for tasks assessed Orientation Level: Oriented X4   Extremity Assessment (includes Sensation/Coordination)  Upper Extremity  Assessment: Generalized weakness, LUE deficits/detail LUE Deficits / Details: pt with AROM at shoulder to approximately 45 degrees. Pt states arthritis in L shoulder. other joints WFL.   Lower Extremity Assessment: Generalized weakness (R side weaker than the L)    ADLs  Overall ADL's : Needs assistance/impaired Eating/Feeding: Sitting, Supervision/ safety Grooming: Wash/dry hands, Set up, Sitting Upper Body Bathing: Supervision/ safety, Set up, Sitting Lower Body Bathing: Moderate assistance, Sit to/from stand Upper Body Dressing : Sitting, Minimal assistance Lower Body Dressing: Moderate assistance, Sit to/from stand Toilet Transfer: Minimal assistance, Moderate assistance, Ambulation, RW, Comfort height toilet Toileting- Clothing Manipulation and Hygiene: Moderate assistance, Sit to/from stand General ADL Comments: Pt with some difficulty manuevering throughout tight space with walker into bathroom and around door. Noted BM smudege on chair pad so assisted pt to the bathroom to have BM and for hygiene/cleanup. Pt able to manage wiping/hygiene from seated position with increased time. Cues for hand placement with functional transfers and for better placement of hands on walker for safety. Pt able to cross LEs up for donning/doffing socks.     Mobility  Overal bed mobility: Needs Assistance Bed Mobility: Supine to Sit Supine to sit: Mod assist General bed mobility comments: in chair.     Transfers  Overall transfer level: Needs assistance Equipment used: Rolling walker (2 wheeled) Transfers: Sit to/from Stand Sit to Stand: Min assist General transfer comment: cues for hand placement.     Ambulation / Gait / Stairs / Wheelchair Mobility  Ambulation/Gait Ambulation/Gait assistance: Min assist (+1 for chair follow) Ambulation Distance (Feet): 75 Feet Assistive device: Rolling walker (2 wheeled) Gait Pattern/deviations: Decreased stride length, Step-through pattern,  Decreased weight shift to right, Narrow base of support General Gait Details: max v/c's to increase base of support as pt was amb with  narrow base of support/near crossover. increased dependence on UEs Gait velocity interpretation: Below normal speed for age/gender    Posture / Balance Balance Overall balance assessment: Needs assistance Sitting-balance support: Single extremity supported Sitting balance-Leahy Scale: Fair (note some leaning backwards with trying to scoot forward to edge fo chair.) Standing balance support: Bilateral upper extremity supported Standing balance-Leahy Scale: Poor Standing balance comment: pt stood x 2 min for hygiene s/p BM. pt aware but did not call    Special needs/care consideration  Bowel mgmt: LBM 7/11 incontinent due to meds. Wears depends?wife to provide Bladder mgmt: incontinent Receiving q wed chemo injection pta   Previous Home Environment Living Arrangements: Spouse/significant other Lives With: Spouse Available Help at Discharge: Family, Available 24 hours/day Type of Home: House Home Layout: One level Home Access: Ramped entrance Bathroom Shower/Tub: Multimedia programmer: Handicapped height [place]: No Additional Comments: home fully handicapped accessible  Discharge Living Setting Plans for Discharge Living Setting: Patient's home, Lives with (comment), Other (Comment) (wife) Type of Home at Discharge: House Discharge Home Layout: One level Discharge Home Access: [place] entrance Discharge Bathroom Shower/Tub: Walk-in shower Discharge Bathroom Toilet: Handicapped height Discharge Bathroom Accessibility: Yes How Accessible: Accessible via walker, Accessible via wheelchair Does the patient have any problems obtaining your medications?: No  Social/Family/Support Systems Patient Roles: Spouse, Parent, Other (Comment) (has one sin, Fritz Pickerel , who lives in [place]) [place] Information: Stanton Kidney, wife Anticipated  Caregiver: wife, she is 24 yo and Independent Anticipated Ambulance person Information: see above Ability/Limitations of Caregiver: can provide supervision to min guard Caregiver Availability: 24/7 Discharge Plan Discussed with Primary Caregiver: Yes Is Caregiver In Agreement with Plan?: Yes Does Caregiver/Family have Issues with Lodging/Transportation while Pt is in Rehab?: No Son , larry, in [place], who is on a camping trip with his sons and unaware of pt's admission. He has requested he not be contacted. Pt's wife on 7/11 may contact him  Goals/Additional Needs Patient/Family Goal for Rehab: Mod I with PT, Mod I to supervision with OT and SLP Expected length of stay: ELOS 7-9 days Equipment Needs: Takes Chemo q wednesday. He states a Therapist, occupational to Admission and willing to participate: Yes Program Orientation Provided & Reviewed with Pt/Caregiver Including Roles & Responsibilities: Yes   Decrease burden of Care through IP rehab admission: n/a  Possible need for SNF placement upon discharge: not anticipated  Patient Condition: This patient's condition remains as documented in the consult dated 02/12/2015 , in which the Rehabilitation Physician determined and documented that the patient's condition is appropriate for intensive rehabilitative care in an inpatient rehabilitation facility. Will admit to inpatient rehab today.  Preadmission Screen Completed By: Cleatrice Burke, 02/12/2015 3:11 PM ______________________________________________________________________  Discussed status with Dr. Naaman Plummer on 02/12/2015 at 1510 and received telephone approval for admission today.  Admission Coordinator: Cleatrice Burke, time 3500 Date 02/12/2015.         Cosigned by: Meredith Staggers, MD at 02/12/2015 3:34 PM  Revision History     Date/Time User Provider Type Action   02/12/2015 3:34 PM Meredith Staggers, MD Physician Cosign   02/12/2015 3:29 PM Cristina Gong, RN Rehab Admission Coordinator Sign   02/12/2015 3:11 PM Cristina Gong, RN Rehab Admission Coordinator Sign   View Details Report

## 2015-02-12 NOTE — Progress Notes (Signed)
[facility] PROGRESS NOTE   Jeremiah Owens JQZ:[id number] DOB: 1930/03/12 DOA: 02/10/2015 PCP: Wenda Low, MD  HPI/Subjective: Feels okay, seen eating his breakfast this morning.  Assessment/Plan: Principal Problem:   CVA (cerebral infarction) Active Problems:   Spine metastasis   Multiple myeloma   Gait disorder   Peripheral edema   Aspiration pneumonia   Aphasia   Difficulty speaking   Speech difficult to understand   Stroke    Acute CVA Patient presented to the hospital complaining about slurred speech and left-sided weakness. MRI of the brain showed acute infarct. Seen by neurology. Complete stroke workup including carotid duplex and 2-D echo. PT recommended CIR, to be evaluated.  Possible aspiration pneumonia CXR showed evidence of right upper lobe infiltrates consistent with pneumonia. Patient started on Unasyn, afebrile, no other complaints.  Dysphagia Patient seen by SLP, MBSS was done and recommended dysphagia 3 with nectar thick liquids. Recommended rehabilitation consultation.  Multiple myeloma with spinal metastasis Patient is on chemotherapy, patient is on acyclovir, Norco. Presumed Revlimid based on oncology recommendation.  Hypothyroidism Synthroid continued.  BPH symptoms.  Code Status: Full Code Family Communication: Plan discussed with the patient. Disposition Plan: Remains inpatient Diet: DIET DYS 3 Room service appropriate?: Yes; Fluid consistency:: Nectar Thick  Consultants:  Neurology  Procedures:  None  Antibiotics:  None   Objective: Filed Vitals:   02/12/15 0926  BP: 120/55  Pulse: 78  Temp: 97 F (36.1 C)  Resp: 16    Intake/Output Summary (Last 24 hours) at 02/12/15 1313 Last data filed at 02/12/15 0808  Gross per 24 hour  Intake    435 ml  Output   1900 ml  Net  -1465 ml   Filed Weights   02/11/15 0305 02/11/15 0413  Weight: 72.576 kg (160 lb) 73.3 kg (161 lb 9.6 oz)    Exam: General: Alert  and awake, oriented x3, not in any acute distress. HEENT: anicteric sclera, pupils reactive to light and accommodation, EOMI CVS: S1-S2 clear, no murmur rubs or gallops Chest: clear to auscultation bilaterally, no wheezing, rales or rhonchi Abdomen: soft nontender, nondistended, normal bowel sounds, no organomegaly Extremities: no cyanosis, clubbing or edema noted bilaterally Neuro: Cranial nerves II-XII intact, no focal neurological deficits  Data Reviewed: Basic Metabolic Panel:  Recent Labs Lab 02/07/15 1009 02/10/15 2209 02/12/15 1050  NA 140 138 138  K 4.1 3.8 3.5  CL  --  103 102  CO2 '29 27 30  ' GLUCOSE 91 111* 135*  BUN 15.2 25* 10  CREATININE 0.9 1.21 0.89  CALCIUM 9.2 9.0 8.4*   Liver Function Tests:  Recent Labs Lab 02/07/15 1009 02/10/15 2209  AST 20 21  ALT 21 19  ALKPHOS 63 56  BILITOT 0.62 0.7  PROT 5.7* 5.9*  ALBUMIN 3.5 3.5   No results for input(s): LIPASE, AMYLASE in the last 168 hours. No results for input(s): AMMONIA in the last 168 hours. CBC:  Recent Labs Lab 02/07/15 1009 02/10/15 2209 02/12/15 1050  WBC 3.1* 2.6* 2.6*  NEUTROABS 2.6 1.6*  --   HGB 12.3* 11.8* 13.5  HCT 36.3* 35.3* 39.7  MCV 95.2 94.9 92.8  PLT 135* 124* 110*   Cardiac Enzymes:  Recent Labs Lab 02/10/15 2209  CKTOTAL 60   BNP (last 3 results) No results for input(s): BNP in the last 8760 hours.  ProBNP (last 3 results) No results for input(s): PROBNP in the last 8760 hours.  CBG:  Recent Labs Lab 02/10/15 2202  GLUCAP 112*  Micro No results found for this or any previous visit (from the past 240 hour(s)).   Studies: Dg Chest 2 View  02/11/2015   CLINICAL DATA:  Weakness  EXAM: CHEST  2 VIEW  COMPARISON:  06/17/2014  FINDINGS: Subpleural opacity in the right upper lobe is asymmetric and could reflect consolidation/pneumonia. This was not seen on scout from 10/17/2014 thoracic spine CT, arguing against neoplastic process. Chronic cardiomegaly.  Negative aortic and hilar contours. Ventilation is low.  Multiple myeloma with upper thoracic posterior fixation.  IMPRESSION: 1. Subtle opacity in the right upper lobe, potentially pneumonia. Recommend 3-4 week follow-up after treatment. 2. Hypoventilation.   Electronically Signed   By: Monte Fantasia M.D.   On: 02/11/2015 01:36   Ct Head Wo Contrast  02/11/2015   CLINICAL DATA:  Difficulty with speech with dizziness. Chronic right leg weakness.  EXAM: CT HEAD WITHOUT CONTRAST  TECHNIQUE: Contiguous axial images were obtained from the base of the skull through the vertex without intravenous contrast.  COMPARISON:  None.  FINDINGS: Skull and Sinuses:Negative for fracture or destructive process. The mastoids, middle ears, and imaged paranasal sinuses are clear.  Orbits: No acute abnormality.  Brain: No evidence of acute gray matter infarction, hemorrhage, hydrocephalus, or mass lesion/mass effect. There is generalized brain atrophy with ventriculomegaly, mild. Chronic small-vessel disease with patchy ischemic gliosis throughout the bilateral cerebral white matter. There is ischemic change in the right putamen, likely chronic based on history.  IMPRESSION: 1. No intracranial hemorrhage or definite infarct. 2. Moderate chronic small vessel disease, which could obscure a recent white matter infarct.   Electronically Signed   By: Monte Fantasia M.D.   On: 02/11/2015 00:20   Mr Brain Wo Contrast  02/11/2015   CLINICAL DATA:  Acute on chronic RIGHT leg weakness, new onset garbled speech. History of melanoma, prostate cancer, stroke.  EXAM: MRI HEAD WITHOUT CONTRAST  MRA HEAD WITHOUT CONTRAST  TECHNIQUE: Multiplanar, multiecho pulse sequences of the brain and surrounding structures were obtained without intravenous contrast. Angiographic images of the head were obtained using MRA technique without contrast.  COMPARISON:  CT of the head February 10, 2015  FINDINGS: MRI HEAD FINDINGS  18 x 9 mm area of reduced diffusion in  LEFT basal ganglia (lenticular striate nucleus), corresponding low ADC value consistent with acute ischemia. Corresponding mild FLAIR hyperintense signal. No susceptibility artifact to suggest hemorrhage.  Ventricles and sulci are normal for patient's age. Patchy to confluent supratentorial, pontine white matter T2 hyperintensities without midline shift, mass effect, mass lesions. Innumerable tiny T2 hyperintensities within the basal ganglia compatible with perivascular spaces. In addition, small old RIGHT caudate body lacunar infarct. No abnormal extra-axial fluid collections. Normal major intracranial vascular flow voids seen at the skull base.  Ocular globes and orbital contents are unremarkable. Mild paranasal sinus mucosal thickening without air-fluid levels. Trace LEFT mastoid effusion.  MRA HEAD FINDINGS  Anterior circulation: Normal flow related enhancement of the included cervical, petrous, cavernous and supra clinoid internal carotid arteries. Patent anterior communicating artery. Normal flow related enhancement of the anterior and middle cerebral arteries, including more distal segments.  No large vessel occlusion, high-grade stenosis, aneurysm. Mild middle cerebral artery luminal irregularity.  Posterior circulation: Codominant vertebral artery's. Basilar artery is patent, with normal flow related enhancement of the main branch vessels. Normal flow related enhancement of the posterior cerebral arteries. Small LEFT posterior communicating artery is present.  No large vessel occlusion, high-grade stenosis, mild luminal irregularity of the posterior cerebral arteries. No aneurysm.  IMPRESSION: MRI HEAD: Acute 18 x 9 mm LEFT basal ganglia lacunar infarct.  Involutional changes. Moderate white matter changes compatible chronic small vessel ischemic disease. Old RIGHT caudate lacunar infarct.  MRA HEAD: No acute large vessel occlusion or high-grade stenosis.  Mild luminal irregularity intracranial vessels  compatible with atherosclerosis.   Electronically Signed   By: Elon Alas M.D.   On: 02/11/2015 06:23   Dg Swallowing Func-speech Pathology  02/11/2015    Objective Swallowing Evaluation:    Patient Details  Name: Jeremiah Owens MRN: [id number] Date of Birth: 03/10/1930  Today's Date: 02/11/2015 Time: SLP Start Time (ACUTE ONLY): 1340-SLP Stop Time (ACUTE ONLY): 1400 SLP Time Calculation (min) (ACUTE ONLY): 20 min  Past Medical History:  Past Medical History  Diagnosis Date  . Compression fracture   . Allergy   . Anxiety   . Thyroid disease   . S/P radiation therapy 08/21/14-09/04/14    T4-6 25Gy/53f  . S/P radiation therapy 11/29/14-12/12/14    rt prox humerus/shoulder/scapula 25Gy/126f . Encounter for antineoplastic chemotherapy 01/31/2015  . Melanoma   . Bone cancer     t_spine T-5  . Prostate cancer 2002  . Skin cancer 1994    melanoma  right neck   . Multiple myeloma    Past Surgical History:  Past Surgical History  Procedure Laterality Date  . Hernia repair    . Posterior cervical fusion/foraminotomy N/A 06/17/2014    Procedure: Thoracic five laminectomy for epidural tumor resection  Thoracic 4-7 posterior lateral arthrodesis, segmental pedicle screw  fixation.;  Surgeon: KyAshok PallMD;  [facility];  Service:  Neurosurgery;  Laterality: N/A;   HPI:  Other Pertinent Information: RoJustin Meisenheimers a [age].o. male with a  history of multiple myeloma treated with Revlamid who presents today with  2 days of right leg weakness and difficulty speaking. He states that this  started quite abruptly. Prior to this, he did not have any problems with  his speech either. He states that he has difficulty with finding his  words, and indeed it is difficult getting history from him because of some  word finding difficulty. MRI shows Acute 18 x 9 mm LEFT basal ganglia  lacunar infarct  No Data Recorded  Assessment / Plan / Recommendation CHL IP CLINICAL IMPRESSIONS 02/11/2015  Therapy Diagnosis Moderate pharyngeal  phase dysphagia  Clinical Impression Pt presents with a neuromuscular dysphagia with motor  and sensory deficits due to impairment. There is delayed initiation of the  swallow, particularly dangerous with thin liquids. With all textures there  is weakness of the right lateral wall and possible decreased adduction of  the right arytenoid resulting in silent penetration during the swallow and  residuals in the right lateral channel which are silently aspriated during  and after the swallow. With a head turn right laryngeal closure duirng the  swallow improves and residuals are eliminated. Nectar thick liquids are  still warranted due to delayed timing of swallow. Recommend pt consume a  dys 3 (mechanical soft) diet with nectar thick liquids. Initially pt will  need full supervision to ensure head turn right. As pt demonstrates  independence, supervision can fade. SLP will follow for tolerance.       CHL IP TREATMENT RECOMMENDATION 02/11/2015  Treatment Recommendations Therapy as outlined in treatment plan below     CHL IP DIET RECOMMENDATION 02/11/2015  SLP Diet Recommendations (None)  Liquid Administration via (None)  Medication Administration Whole meds with puree  Compensations  Other (Comment);Small sips/bites;Slow rate  Postural Changes and/or Swallow Maneuvers (None)     CHL IP OTHER RECOMMENDATIONS 02/11/2015  Recommended Consults (None)  Oral Care Recommendations Oral care BID  Other Recommendations Order thickener from pharmacy     No flowsheet data found.   CHL IP FREQUENCY AND DURATION 02/11/2015  Speech Therapy Frequency (ACUTE ONLY) min 2x/week  Treatment Duration 2 weeks     Pertinent Vitals/Pain NA    SLP Swallow Goals No flowsheet data found.  No flowsheet data found.    CHL IP REASON FOR REFERRAL 02/11/2015  Reason for Referral Objectively evaluate swallowing function               DeBlois, Katherene Ponto 02/11/2015, 2:16 PM    Mr Jodene Nam Head/brain Wo Cm  02/11/2015   CLINICAL DATA:  Acute on chronic RIGHT  leg weakness, new onset garbled speech. History of melanoma, prostate cancer, stroke.  EXAM: MRI HEAD WITHOUT CONTRAST  MRA HEAD WITHOUT CONTRAST  TECHNIQUE: Multiplanar, multiecho pulse sequences of the brain and surrounding structures were obtained without intravenous contrast. Angiographic images of the head were obtained using MRA technique without contrast.  COMPARISON:  CT of the head February 10, 2015  FINDINGS: MRI HEAD FINDINGS  18 x 9 mm area of reduced diffusion in LEFT basal ganglia (lenticular striate nucleus), corresponding low ADC value consistent with acute ischemia. Corresponding mild FLAIR hyperintense signal. No susceptibility artifact to suggest hemorrhage.  Ventricles and sulci are normal for patient's age. Patchy to confluent supratentorial, pontine white matter T2 hyperintensities without midline shift, mass effect, mass lesions. Innumerable tiny T2 hyperintensities within the basal ganglia compatible with perivascular spaces. In addition, small old RIGHT caudate body lacunar infarct. No abnormal extra-axial fluid collections. Normal major intracranial vascular flow voids seen at the skull base.  Ocular globes and orbital contents are unremarkable. Mild paranasal sinus mucosal thickening without air-fluid levels. Trace LEFT mastoid effusion.  MRA HEAD FINDINGS  Anterior circulation: Normal flow related enhancement of the included cervical, petrous, cavernous and supra clinoid internal carotid arteries. Patent anterior communicating artery. Normal flow related enhancement of the anterior and middle cerebral arteries, including more distal segments.  No large vessel occlusion, high-grade stenosis, aneurysm. Mild middle cerebral artery luminal irregularity.  Posterior circulation: Codominant vertebral artery's. Basilar artery is patent, with normal flow related enhancement of the main branch vessels. Normal flow related enhancement of the posterior cerebral arteries. Small LEFT posterior communicating  artery is present.  No large vessel occlusion, high-grade stenosis, mild luminal irregularity of the posterior cerebral arteries. No aneurysm.  IMPRESSION: MRI HEAD: Acute 18 x 9 mm LEFT basal ganglia lacunar infarct.  Involutional changes. Moderate white matter changes compatible chronic small vessel ischemic disease. Old RIGHT caudate lacunar infarct.  MRA HEAD: No acute large vessel occlusion or high-grade stenosis.  Mild luminal irregularity intracranial vessels compatible with atherosclerosis.   Electronically Signed   By: Elon Alas M.D.   On: 02/11/2015 06:23    Scheduled Meds: . ampicillin-sulbactam (UNASYN) IV  3 g Intravenous 4 times per day  . aspirin  300 mg Rectal Daily   Or  . aspirin  325 mg Oral Daily  . heparin  5,000 Units Subcutaneous 3 times per day  . NONFORMULARY OR COMPOUNDED ITEM 25 mg  25 mg Oral QHS   Continuous Infusions:       Time spent: 35 minutes    [facility] A  [facility] Pager [phone] If 7PM-7AM, please contact night-coverage at [email or web address], password  [email or web address] 02/12/2015, 1:13 PM  LOS: 1 day

## 2015-02-12 NOTE — Progress Notes (Addendum)
Pt had loose BM just prior to transfer.  Oncoming RN notified.  Discharge orders received, pt for discharge to CIR, Family at bedside to assist pt with discharge. Staff brought pt to CIR via bed.

## 2015-02-12 NOTE — Consult Note (Signed)
Physical Medicine and Rehabilitation Consult Reason for Consult: Left basal ganglia lacunar infarction/history of multiple myeloma Referring Physician: [facility]   HPI: Jeremiah Owens is a 81 y.o. right handed male with history of multiple myeloma on chemotherapy, radiation therapy for spinal metastasis 08/21/2014-09/04/2014 as well as 11/29/2014-12/12/2014 per Dr. Lisbeth Renshaw and Dr. Earlie Server. Well known to rehabilitation services from admission November 2015 for thoracic spine decompression and fusion related to multiple myeloma. Patient lives with his wife used a rolling walker to ambulate short community distances prior to admission. Presented 02/11/2015 with right sided weakness and slurred speech 2 days. MRI of the brain showed acute left basal ganglia lacunar infarction as well as old right caudate lacunar infarct. MRA of the head with no large vessel occlusion or stenosis. Carotid Doppler but no ICA stenosis. Echocardiogram pending. Patient did not receive TPA. Neurology consulted presently on aspirin for CVA prophylaxis as well as subcutaneous heparin for DVT prophylaxis. Suspect aspiration pneumonia currently maintained on Unasyn. Dysphagia #3 nectar thick liquid diet. He continues to receive weekly chemotherapy at [facility]. Physical therapy evaluation completed 02/12/2015 with recommendations of physical medicine rehabilitation consult.   Review of Systems  Constitutional: Negative for fever and chills.  HENT: Positive for hearing loss.   Eyes: Negative for double vision.  Respiratory: Negative for cough and hemoptysis.   Cardiovascular: Negative for chest pain and palpitations.  Gastrointestinal: Positive for constipation.  Genitourinary: Positive for urgency.  Musculoskeletal: Positive for myalgias, back pain and joint pain.  Skin: Negative for rash.  Neurological: Positive for speech change and weakness. Negative for headaches.  Psychiatric/Behavioral: Positive for memory  loss.       Anxiety   Past Medical History  Diagnosis Date  . Compression fracture   . Allergy   . Anxiety   . Thyroid disease   . S/P radiation therapy 08/21/14-09/04/14    T4-6 25Gy/62f  . S/P radiation therapy 11/29/14-12/12/14    rt prox humerus/shoulder/scapula 25Gy/185f . Encounter for antineoplastic chemotherapy 01/31/2015  . Melanoma   . Bone cancer     t_spine T-5  . Prostate cancer 2002  . Skin cancer 1994    melanoma  right neck   . Multiple myeloma    Past Surgical History  Procedure Laterality Date  . Hernia repair    . Posterior cervical fusion/foraminotomy N/A 06/17/2014    Procedure: Thoracic five laminectomy for epidural tumor resection Thoracic 4-7 posterior lateral arthrodesis, segmental pedicle screw fixation.;  Surgeon: KyAshok PallMD;  [facility];  Service: Neurosurgery;  Laterality: N/A;   Family History  Problem Relation Age of Onset  . Cancer Mother     breast   Social History:  reports that he has never smoked. He does not have any smokeless tobacco history on file. He reports that he does not drink alcohol or use illicit drugs. Allergies:  Allergies  Allergen Reactions  . Iodine Itching    Patient had a reaction 20 years ago and can't remember what kind of reaction he had.  Patient was given decadron in the ER and had an Angio ordered and did test with no problems.  Bottom line "patient needs to pre-medicated before any IV dye is given".   Medications Prior to Admission  Medication Sig Dispense Refill  . acyclovir (ZOVIRAX) 400 MG tablet TAKE 1 TABLET BY MOUTH TWICE A DAY 60 tablet 2  . Calcium Carbonate-Vitamin D (CALTRATE 600+D PO) Take 600 mg by mouth 2 (two) times  daily.    . cholecalciferol (VITAMIN D) 1000 UNITS tablet Take 1,000 Units by mouth at bedtime.     . furosemide (LASIX) 20 MG tablet Take 1 tablet (20 mg total) by mouth daily. As needed for swelling (Patient taking differently: Take 20 mg by mouth 2 (two) times daily.  As needed for swelling) 10 tablet 0  . HYDROcodone-acetaminophen (NORCO) 5-325 MG per tablet Take 1 tablet by mouth every 6 (six) hours as needed for moderate pain. 60 tablet 0  . HYDROCORTISONE, TOPICAL, 2.5 % SOLN Apply 1 application topically 3 (three) times daily as needed (for itching).     Marland Kitchen lenalidomide (REVLIMID) 25 MG capsule Take 1 capsule (25 mg total) by mouth daily. Take one capsule ( 25 mg) by mouth for 21 days every 4 weeks-01/31/15 Authorization number=[id number]  adult male 21 capsule 0  . levothyroxine (SYNTHROID, LEVOTHROID) 25 MCG tablet Take 1 tablet (25 mcg total) by mouth daily. 30 tablet 4  . methocarbamol (ROBAXIN) 500 MG tablet TAKE 1 TABLET (500 MG TOTAL) BY MOUTH EVERY 6 (SIX) HOURS AS NEEDED FOR MUSCLE SPASMS. 60 tablet 0  . Multiple Vitamins-Minerals (CENTRUM SILVER PO) Take 1 tablet by mouth daily.    Marland Kitchen omeprazole (PRILOSEC) 40 MG capsule Take 40 mg by mouth daily.    . Polyvinyl Alcohol-Povidone (REFRESH OP) Apply 1 drop to eye 4 (four) times daily as needed (dry/irritated eyes).     . potassium chloride (K-DUR,KLOR-CON) 10 MEQ tablet Take 10 mEq by mouth daily.    . prochlorperazine (COMPAZINE) 10 MG tablet Take 1 tablet (10 mg total) by mouth every 6 (six) hours as needed for nausea or vomiting. 30 tablet 0  . sennosides-docusate sodium (SENOKOT-S) 8.6-50 MG tablet Take 1-2 tablets by mouth 2 (two) times daily as needed for constipation. Takes 1-2 tabs daily    . tamsulosin (FLOMAX) 0.4 MG CAPS capsule Take 1 capsule (0.4 mg total) by mouth at bedtime. 30 capsule 3  . warfarin (COUMADIN) 2 MG tablet Take 1 tablet (2 mg total) by mouth daily. 30 tablet 3  . dexamethasone (DECADRON) 4 MG tablet 10 TAB EVERY WEEK, START WITH CHEMO 40 tablet 4  . ranitidine (ZANTAC) 150 MG tablet Take 150 mg by mouth daily as needed for heartburn.   11    Home: Home Living Family/patient expects to be discharged to:: Private residence Living Arrangements: Spouse/significant  other Available Help at Discharge: Family, Available 24 hours/day Type of Home: House Home Access: [place]: One [place]: Environmental consultant - 2 wheels, Environmental consultant - 4 wheels, [place] - single point, Bedside commode, Shower seat, Wheelchair - manual, Grab bars - toilet, Grab bars - tub/shower  Functional History: Prior Function Level of Independence: Needs assistance Gait / Transfers Assistance Needed: used a RW, able to amb short community distances ADL's / [employer] Assistance Needed: minimal assist for dressing/bathing occasionally Functional Status:  Mobility: Bed Mobility Overal bed mobility: Needs Assistance Bed Mobility: Supine to Sit Supine to sit: Mod assist General bed mobility comments: assist to bring hips to EOB, pt able to push self up in sitting using handrails but unable to scoot to EOB so feet were on the floor Transfers Overall transfer level: Needs assistance Equipment used: Rolling walker (2 wheeled) Transfers: Sit to/from Stand Sit to Stand: Min assist General transfer comment: v/c's for safe hand placement, increased time, pt urinary incontinent up standing and unaware Ambulation/Gait Ambulation/Gait assistance: Min assist (+1 for chair follow) Ambulation Distance (Feet): 75 Feet  Assistive device: Rolling walker (2 wheeled) General Gait Details: max v/c's to increase base of support as pt was amb with narrow base of support/near crossover. increased dependence on UEs Gait Pattern/deviations: Decreased stride length, Step-through pattern, Decreased weight shift to right, Narrow base of support Gait velocity interpretation: Below normal speed for age/gender    ADL:    Cognition: Cognition Overall Cognitive Status: Within Functional Limits for tasks assessed Orientation Level: Oriented X4 Cognition Arousal/Alertness: Awake/alert Behavior During Therapy: WFL for tasks assessed/performed Overall Cognitive Status: Within Functional Limits for  tasks assessed  Blood pressure 120/55, pulse 78, temperature 97 F (36.1 C), temperature source Oral, resp. rate 16, height _0  (1.753 m), weight 73.3 kg (161 lb 9.6 oz), SpO2 98 %. Physical Exam  Constitutional: He is oriented to person, place, and time.  HENT:  Mild facial droop  Eyes: EOM are normal.  Neck: Normal range of motion. Neck supple. No thyromegaly present.  Cardiovascular: Normal rate and regular rhythm.   Respiratory: Effort normal and breath sounds normal. No respiratory distress.  GI: Soft. Bowel sounds are normal. He exhibits no distension.  Neurological: He is alert and oriented to person, place, and time.  Speech is mildly slurred but intelligible. Right central 7. He is oriented to person place and date of birth but limited medical historian. Occasional word finding deficits. RUE: 4/5 prox to distal. Decreased FMC. RLE: 3+hf, 4/5 ke, adf/apf. LLE: 4+/5. Decreased proprioception in both legs (chronic)  Skin: Skin is warm and dry.  Psychiatric: He has a normal mood and affect. His behavior is normal.    Results for orders placed or performed during the hospital encounter of 02/10/15 (from the past 24 hour(s))  Lipid panel     Status: None   Collection Time: 02/11/15 12:54 PM  Result Value Ref Range   Cholesterol 165 0 - 200 mg/dL   Triglycerides 96 <150 mg/dL   HDL 70 >40 mg/dL   Total CHOL/HDL Ratio 2.4 RATIO   VLDL 19 0 - 40 mg/dL   LDL Cholesterol 76 0 - 99 mg/dL   Dg Chest 2 View  02/11/2015   CLINICAL DATA:  Weakness  EXAM: CHEST  2 VIEW  COMPARISON:  06/17/2014  FINDINGS: Subpleural opacity in the right upper lobe is asymmetric and could reflect consolidation/pneumonia. This was not seen on scout from 10/17/2014 thoracic spine CT, arguing against neoplastic process. Chronic cardiomegaly. Negative aortic and hilar contours. Ventilation is low.  Multiple myeloma with upper thoracic posterior fixation.  IMPRESSION: 1. Subtle opacity in the right upper lobe,  potentially pneumonia. Recommend 3-4 week follow-up after treatment. 2. Hypoventilation.   Electronically Signed   By: Monte Fantasia M.D.   On: 02/11/2015 01:36   Ct Head Wo Contrast  02/11/2015   CLINICAL DATA:  Difficulty with speech with dizziness. Chronic right leg weakness.  EXAM: CT HEAD WITHOUT CONTRAST  TECHNIQUE: Contiguous axial images were obtained from the base of the skull through the vertex without intravenous contrast.  COMPARISON:  None.  FINDINGS: Skull and Sinuses:Negative for fracture or destructive process. The mastoids, middle ears, and imaged paranasal sinuses are clear.  Orbits: No acute abnormality.  Brain: No evidence of acute gray matter infarction, hemorrhage, hydrocephalus, or mass lesion/mass effect. There is generalized brain atrophy with ventriculomegaly, mild. Chronic small-vessel disease with patchy ischemic gliosis throughout the bilateral cerebral white matter. There is ischemic change in the right putamen, likely chronic based on history.  IMPRESSION: 1. No intracranial hemorrhage or definite infarct.  2. Moderate chronic small vessel disease, which could obscure a recent white matter infarct.   Electronically Signed   By: Monte Fantasia M.D.   On: 02/11/2015 00:20   Mr Brain Wo Contrast  02/11/2015   CLINICAL DATA:  Acute on chronic RIGHT leg weakness, new onset garbled speech. History of melanoma, prostate cancer, stroke.  EXAM: MRI HEAD WITHOUT CONTRAST  MRA HEAD WITHOUT CONTRAST  TECHNIQUE: Multiplanar, multiecho pulse sequences of the brain and surrounding structures were obtained without intravenous contrast. Angiographic images of the head were obtained using MRA technique without contrast.  COMPARISON:  CT of the head February 10, 2015  FINDINGS: MRI HEAD FINDINGS  18 x 9 mm area of reduced diffusion in LEFT basal ganglia (lenticular striate nucleus), corresponding low ADC value consistent with acute ischemia. Corresponding mild FLAIR hyperintense signal. No  susceptibility artifact to suggest hemorrhage.  Ventricles and sulci are normal for patient's age. Patchy to confluent supratentorial, pontine white matter T2 hyperintensities without midline shift, mass effect, mass lesions. Innumerable tiny T2 hyperintensities within the basal ganglia compatible with perivascular spaces. In addition, small old RIGHT caudate body lacunar infarct. No abnormal extra-axial fluid collections. Normal major intracranial vascular flow voids seen at the skull base.  Ocular globes and orbital contents are unremarkable. Mild paranasal sinus mucosal thickening without air-fluid levels. Trace LEFT mastoid effusion.  MRA HEAD FINDINGS  Anterior circulation: Normal flow related enhancement of the included cervical, petrous, cavernous and supra clinoid internal carotid arteries. Patent anterior communicating artery. Normal flow related enhancement of the anterior and middle cerebral arteries, including more distal segments.  No large vessel occlusion, high-grade stenosis, aneurysm. Mild middle cerebral artery luminal irregularity.  Posterior circulation: Codominant vertebral artery's. Basilar artery is patent, with normal flow related enhancement of the main branch vessels. Normal flow related enhancement of the posterior cerebral arteries. Small LEFT posterior communicating artery is present.  No large vessel occlusion, high-grade stenosis, mild luminal irregularity of the posterior cerebral arteries. No aneurysm.  IMPRESSION: MRI HEAD: Acute 18 x 9 mm LEFT basal ganglia lacunar infarct.  Involutional changes. Moderate white matter changes compatible chronic small vessel ischemic disease. Old RIGHT caudate lacunar infarct.  MRA HEAD: No acute large vessel occlusion or high-grade stenosis.  Mild luminal irregularity intracranial vessels compatible with atherosclerosis.   Electronically Signed   By: Elon Alas M.D.   On: 02/11/2015 06:23   Dg Swallowing Func-speech Pathology  02/11/2015     Objective Swallowing Evaluation:    Patient Details  Name: Jeremiah Owens MRN: [id number] Date of Birth: 28-Oct-1929  Today's Date: 02/11/2015 Time: SLP Start Time (ACUTE ONLY): 1340-SLP Stop Time (ACUTE ONLY): 1400 SLP Time Calculation (min) (ACUTE ONLY): 20 min  Past Medical History:  Past Medical History  Diagnosis Date  . Compression fracture   . Allergy   . Anxiety   . Thyroid disease   . S/P radiation therapy 08/21/14-09/04/14    T4-6 25Gy/30f  . S/P radiation therapy 11/29/14-12/12/14    rt prox humerus/shoulder/scapula 25Gy/128f . Encounter for antineoplastic chemotherapy 01/31/2015  . Melanoma   . Bone cancer     t_spine T-5  . Prostate cancer 2002  . Skin cancer 1994    melanoma  right neck   . Multiple myeloma    Past Surgical History:  Past Surgical History  Procedure Laterality Date  . Hernia repair    . Posterior cervical fusion/foraminotomy N/A 06/17/2014    Procedure: Thoracic five laminectomy for epidural tumor resection  Thoracic 4-7  posterior lateral arthrodesis, segmental pedicle screw  fixation.;  Surgeon: Ashok Pall, MD;  Location: [facility];  Service:  Neurosurgery;  Laterality: N/A;   HPI:  Other Pertinent Information: Jeremiah Owens is a 81 y.o. male with a  history of multiple myeloma treated with Revlamid who presents today with  2 days of right leg weakness and difficulty speaking. He states that this  started quite abruptly. Prior to this, he did not have any problems with  his speech either. He states that he has difficulty with finding his  words, and indeed it is difficult getting history from him because of some  word finding difficulty. MRI shows Acute 18 x 9 mm LEFT basal ganglia  lacunar infarct  No Data Recorded  Assessment / Plan / Recommendation CHL IP CLINICAL IMPRESSIONS 02/11/2015  Therapy Diagnosis Moderate pharyngeal phase dysphagia  Clinical Impression Pt presents with a neuromuscular dysphagia with motor  and sensory deficits due to impairment. There is delayed initiation of  the  swallow, particularly dangerous with thin liquids. With all textures there  is weakness of the right lateral wall and possible decreased adduction of  the right arytenoid resulting in silent penetration during the swallow and  residuals in the right lateral channel which are silently aspriated during  and after the swallow. With a head turn right laryngeal closure duirng the  swallow improves and residuals are eliminated. Nectar thick liquids are  still warranted due to delayed timing of swallow. Recommend pt consume a  dys 3 (mechanical soft) diet with nectar thick liquids. Initially pt will  need full supervision to ensure head turn right. As pt demonstrates  independence, supervision can fade. SLP will follow for tolerance.       CHL IP TREATMENT RECOMMENDATION 02/11/2015  Treatment Recommendations Therapy as outlined in treatment plan below     CHL IP DIET RECOMMENDATION 02/11/2015  SLP Diet Recommendations (None)  Liquid Administration via (None)  Medication Administration Whole meds with puree  Compensations Other (Comment);Small sips/bites;Slow rate  Postural Changes and/or Swallow Maneuvers (None)     CHL IP OTHER RECOMMENDATIONS 02/11/2015  Recommended Consults (None)  Oral Care Recommendations Oral care BID  Other Recommendations Order thickener from pharmacy     No flowsheet data found.   CHL IP FREQUENCY AND DURATION 02/11/2015  Speech Therapy Frequency (ACUTE ONLY) min 2x/week  Treatment Duration 2 weeks     Pertinent Vitals/Pain NA    SLP Swallow Goals No flowsheet data found.  No flowsheet data found.    CHL IP REASON FOR REFERRAL 02/11/2015  Reason for Referral Objectively evaluate swallowing function               DeBlois, Katherene Ponto 02/11/2015, 2:16 PM    Mr Jodene Nam Head/brain Wo Cm  02/11/2015   CLINICAL DATA:  Acute on chronic RIGHT leg weakness, new onset garbled speech. History of melanoma, prostate cancer, stroke.  EXAM: MRI HEAD WITHOUT CONTRAST  MRA HEAD WITHOUT CONTRAST  TECHNIQUE:  Multiplanar, multiecho pulse sequences of the brain and surrounding structures were obtained without intravenous contrast. Angiographic images of the head were obtained using MRA technique without contrast.  COMPARISON:  CT of the head February 10, 2015  FINDINGS: MRI HEAD FINDINGS  18 x 9 mm area of reduced diffusion in LEFT basal ganglia (lenticular striate nucleus), corresponding low ADC value consistent with acute ischemia. Corresponding mild FLAIR hyperintense signal. No susceptibility artifact to suggest hemorrhage.  Ventricles and sulci are normal for patient's age. Patchy  to confluent supratentorial, pontine white matter T2 hyperintensities without midline shift, mass effect, mass lesions. Innumerable tiny T2 hyperintensities within the basal ganglia compatible with perivascular spaces. In addition, small old RIGHT caudate body lacunar infarct. No abnormal extra-axial fluid collections. Normal major intracranial vascular flow voids seen at the skull base.  Ocular globes and orbital contents are unremarkable. Mild paranasal sinus mucosal thickening without air-fluid levels. Trace LEFT mastoid effusion.  MRA HEAD FINDINGS  Anterior circulation: Normal flow related enhancement of the included cervical, petrous, cavernous and supra clinoid internal carotid arteries. Patent anterior communicating artery. Normal flow related enhancement of the anterior and middle cerebral arteries, including more distal segments.  No large vessel occlusion, high-grade stenosis, aneurysm. Mild middle cerebral artery luminal irregularity.  Posterior circulation: Codominant vertebral artery's. Basilar artery is patent, with normal flow related enhancement of the main branch vessels. Normal flow related enhancement of the posterior cerebral arteries. Small LEFT posterior communicating artery is present.  No large vessel occlusion, high-grade stenosis, mild luminal irregularity of the posterior cerebral arteries. No aneurysm.  IMPRESSION:  MRI HEAD: Acute 18 x 9 mm LEFT basal ganglia lacunar infarct.  Involutional changes. Moderate white matter changes compatible chronic small vessel ischemic disease. Old RIGHT caudate lacunar infarct.  MRA HEAD: No acute large vessel occlusion or high-grade stenosis.  Mild luminal irregularity intracranial vessels compatible with atherosclerosis.   Electronically Signed   By: Elon Alas M.D.   On: 02/11/2015 06:23    Assessment/Plan: Diagnosis: left basal ganglia infarct 1. Does the need for close, 24 hr/day medical supervision in concert with the patient's rehab needs make it unreasonable for this patient to be served in a less intensive setting? Potentially 2. Co-Morbidities requiring supervision/potential complications: multiple myeloma with prior thoracic cord compression 3. Due to bladder management, bowel management, safety, skin/wound care, disease management, medication administration, pain management and patient education, does the patient require 24 hr/day rehab nursing? Yes 4. Does the patient require coordinated care of a physician, rehab nurse, PT (1-2 hrs/day, 5 days/week), OT (1-2 hrs/day, 5 days/week) and SLP (1-2 hrs/day, 5 days/week) to address physical and functional deficits in the context of the above medical diagnosis(es)? Yes Addressing deficits in the following areas: balance, endurance, locomotion, strength, transferring, bowel/bladder control, bathing, dressing, feeding, grooming, toileting, cognition, speech, language, swallowing and psychosocial support 5. Can the patient actively participate in an intensive therapy program of at least 3 hrs of therapy per day at least 5 days per week? Yes 6. The potential for patient to make measurable gains while on inpatient rehab is excellent 7. Anticipated functional outcomes upon discharge from inpatient rehab are modified independent  with PT, modified independent and supervision with OT, modified independent and supervision with  SLP. 8. Estimated rehab length of stay to reach the above functional goals is: 7-9 days 9. Does the patient have adequate social supports and living environment to accommodate these discharge functional goals? Yes 10. Anticipated D/C setting: Home 11. Anticipated post D/C treatments: HH therapy and Outpatient therapy 12. Overall Rehab/Functional Prognosis: excellent  RECOMMENDATIONS: This patient's condition is appropriate for continued rehabilitative care in the following setting: CIR Patient has agreed to participate in recommended program. Yes Note that insurance prior authorization may be required for reimbursement for recommended care.  Comment: Pt well known to me. Very motivated. Just saw him last week in the office and he was doing extremely well!!!----this so unfortunate. Rehab Admissions Coordinator to follow up.  Thanks,  Meredith Staggers, MD, Mellody Drown  02/12/2015

## 2015-02-12 NOTE — H&P (Signed)
Physical Medicine and Rehabilitation Admission H&P    Chief Complaint  Patient presents with  . Extremity Weakness  : HPI: Jeremiah Owens is a 81 y.o. right handed male with history of multiple myeloma on chemotherapy on Revlimid, radiation therapy for spinal metastasis 08/21/2014-09/04/2014 as well as 11/29/2014-12/12/2014 per Dr. Lisbeth Renshaw and Dr. Earlie Server. Chronic Fixed dose Coumadin while maintained on Revlimid Well known to rehabilitation services from admission November 2015 for thoracic spine decompression and fusion related to multiple myeloma. Patient lives with his wife used a rolling walker to ambulate short community distances prior to admission. Presented 02/11/2015 with right sided weakness and slurred speech 2 days. MRI of the brain showed acute left basal ganglia lacunar infarction as well as old right caudate lacunar infarct. MRA of the head with no large vessel occlusion or stenosis. Carotid Doppler but no ICA stenosis. Echocardiogram with ejection fraction of 25% grade 1 diastolic dysfunction. Patient did not receive TPA. Neurology consulted presently on aspirin for CVA prophylaxis and fixed dose Coumadin resumed at 2 mg daily as prior to admission. Suspect aspiration pneumonia currently maintained on Unasyn changed to Augmentin 02/12/2015 5 days. Dysphagia #3 nectar thick liquid diet. He continues to receive weekly chemotherapy at [facility]. Physical therapy evaluation completed 02/12/2015 with recommendations of physical medicine rehabilitation consult. Patient was admitted for comprehensive rehabilitation program  ROS Review of Systems  Constitutional: Negative for fever and chills.  HENT: Positive for hearing loss.  Eyes: Negative for double vision.  Respiratory: Negative for cough and hemoptysis.  Cardiovascular: Negative for chest pain and palpitations.  Gastrointestinal: Positive for constipation.  Genitourinary: Positive for urgency.  Musculoskeletal:  Positive for myalgias, back pain and joint pain.  Skin: Negative for rash.  Neurological: Positive for speech change and weakness. Negative for headaches.  Psychiatric/Behavioral: Positive for memory loss.   Anxiety    Past Medical History  Diagnosis Date  . Compression fracture   . Allergy   . Anxiety   . Thyroid disease   . S/P radiation therapy 08/21/14-09/04/14    T4-6 25Gy/28f  . S/P radiation therapy 11/29/14-12/12/14    rt prox humerus/shoulder/scapula 25Gy/128f . Encounter for antineoplastic chemotherapy 01/31/2015  . Melanoma   . Bone cancer     t_spine T-5  . Prostate cancer 2002  . Skin cancer 1994    melanoma  right neck   . Multiple myeloma    Past Surgical History  Procedure Laterality Date  . Hernia repair    . Posterior cervical fusion/foraminotomy N/A 06/17/2014    Procedure: Thoracic five laminectomy for epidural tumor resection Thoracic 4-7 posterior lateral arthrodesis, segmental pedicle screw fixation.;  Surgeon: KyAshok PallMD;  [facility];  Service: Neurosurgery;  Laterality: N/A;   Family History  Problem Relation Age of Onset  . Cancer Mother     breast   Social History:  reports that he has never smoked. He does not have any smokeless tobacco history on file. He reports that he does not drink alcohol or use illicit drugs. Allergies:  Allergies  Allergen Reactions  . Iodine Itching    Patient had a reaction 20 years ago and can't remember what kind of reaction he had.  Patient was given decadron in the ER and had an Angio ordered and did test with no problems.  Bottom line "patient needs to pre-medicated before any IV dye is given".   Medications Prior to Admission  Medication Sig Dispense Refill  . acyclovir (ZOVIRAX) 400  MG tablet TAKE 1 TABLET BY MOUTH TWICE A DAY 60 tablet 2  . Calcium Carbonate-Vitamin D (CALTRATE 600+D PO) Take 600 mg by mouth 2 (two) times daily.    . cholecalciferol (VITAMIN D) 1000 UNITS tablet Take  1,000 Units by mouth at bedtime.     . furosemide (LASIX) 20 MG tablet Take 1 tablet (20 mg total) by mouth daily. As needed for swelling (Patient taking differently: Take 20 mg by mouth 2 (two) times daily. As needed for swelling) 10 tablet 0  . HYDROcodone-acetaminophen (NORCO) 5-325 MG per tablet Take 1 tablet by mouth every 6 (six) hours as needed for moderate pain. 60 tablet 0  . HYDROCORTISONE, TOPICAL, 2.5 % SOLN Apply 1 application topically 3 (three) times daily as needed (for itching).     Marland Kitchen lenalidomide (REVLIMID) 25 MG capsule Take 1 capsule (25 mg total) by mouth daily. Take one capsule ( 25 mg) by mouth for 21 days every 4 weeks-01/31/15 Authorization number=[id number]  adult male 21 capsule 0  . levothyroxine (SYNTHROID, LEVOTHROID) 25 MCG tablet Take 1 tablet (25 mcg total) by mouth daily. 30 tablet 4  . methocarbamol (ROBAXIN) 500 MG tablet TAKE 1 TABLET (500 MG TOTAL) BY MOUTH EVERY 6 (SIX) HOURS AS NEEDED FOR MUSCLE SPASMS. 60 tablet 0  . Multiple Vitamins-Minerals (CENTRUM SILVER PO) Take 1 tablet by mouth daily.    Marland Kitchen omeprazole (PRILOSEC) 40 MG capsule Take 40 mg by mouth daily.    . Polyvinyl Alcohol-Povidone (REFRESH OP) Apply 1 drop to eye 4 (four) times daily as needed (dry/irritated eyes).     . potassium chloride (K-DUR,KLOR-CON) 10 MEQ tablet Take 10 mEq by mouth daily.    . prochlorperazine (COMPAZINE) 10 MG tablet Take 1 tablet (10 mg total) by mouth every 6 (six) hours as needed for nausea or vomiting. 30 tablet 0  . sennosides-docusate sodium (SENOKOT-S) 8.6-50 MG tablet Take 1-2 tablets by mouth 2 (two) times daily as needed for constipation. Takes 1-2 tabs daily    . tamsulosin (FLOMAX) 0.4 MG CAPS capsule Take 1 capsule (0.4 mg total) by mouth at bedtime. 30 capsule 3  . warfarin (COUMADIN) 2 MG tablet Take 1 tablet (2 mg total) by mouth daily. 30 tablet 3  . dexamethasone (DECADRON) 4 MG tablet 10 TAB EVERY WEEK, START WITH CHEMO 40 tablet 4  . ranitidine (ZANTAC) 150  MG tablet Take 150 mg by mouth daily as needed for heartburn.   11    Home: Home Living Family/patient expects to be discharged to:: Private residence Living Arrangements: Spouse/significant other Available Help at Discharge: Family, Available 24 hours/day Type of Home: House Home Access: [place]: One level Bathroom Shower/Tub: Multimedia programmer: [place]: Environmental consultant - 2 wheels, Environmental consultant - 4 wheels, [place] - single point, Bedside commode, Wheelchair - manual, Grab bars - toilet, Grab bars - tub/shower, Shower seat - built in   Functional History: Prior Function Level of Independence: Needs assistance Gait / Transfers Assistance Needed: used a RW, able to amb short community distances ADL's / Land Needed: minimal assist for dressing/bathing occasionally  Functional Status:  Mobility: Bed Mobility Overal bed mobility: Needs Assistance Bed Mobility: Supine to Sit Supine to sit: Mod assist General bed mobility comments: in chair.  Transfers Overall transfer level: Needs assistance Equipment used: Rolling walker (2 wheeled) Transfers: Sit to/from Stand Sit to Stand: Min assist General transfer comment: cues for hand placement.  Ambulation/Gait Ambulation/Gait assistance: Min assist (+1  for chair follow) Ambulation Distance (Feet): 75 Feet Assistive device: Rolling walker (2 wheeled) Gait Pattern/deviations: Decreased stride length, Step-through pattern, Decreased weight shift to right, Narrow base of support General Gait Details: max v/c's to increase base of support as pt was amb with narrow base of support/near crossover. increased dependence on UEs Gait velocity interpretation: Below normal speed for age/gender    ADL: ADL Overall ADL's : Needs assistance/impaired Eating/Feeding: Sitting, Supervision/ safety Grooming: Wash/dry hands, Set up, Sitting Upper Body Bathing: Supervision/ safety, Set up,  Sitting Lower Body Bathing: Moderate assistance, Sit to/from stand Upper Body Dressing : Sitting, Minimal assistance Lower Body Dressing: Moderate assistance, Sit to/from stand Toilet Transfer: Minimal assistance, Moderate assistance, Ambulation, RW, Comfort height toilet Toileting- Clothing Manipulation and Hygiene: Moderate assistance, Sit to/from stand General ADL Comments: Pt with some difficulty manuevering throughout tight space with walker into bathroom and around door.  Noted BM smudege on chair pad so assisted pt to the bathroom to have BM and for hygiene/cleanup. Pt able to manage wiping/hygiene from seated position with increased time. Cues for hand placement with functional transfers and for better placement of hands on walker for safety. Pt able to cross LEs up for donning/doffing socks.   Cognition: Cognition Overall Cognitive Status: Within Functional Limits for tasks assessed Orientation Level: Oriented X4 Cognition Arousal/Alertness: Awake/alert Behavior During Therapy: WFL for tasks assessed/performed Overall Cognitive Status: Within Functional Limits for tasks assessed  Physical Exam: Blood pressure 130/54, pulse 73, temperature 98.1 F (36.7 C), temperature source Oral, resp. rate 16, height 5' 9" (1.753 m), weight 73.3 kg (161 lb 9.6 oz), SpO2 98 %. Physical Exam Constitutional: He is oriented to person, place, and time.  HENT:  Oral mucosa pink, moist, dentition good Eyes: EOM are normal.  Neck: Normal range of motion. Neck supple. No thyromegaly present.  Cardiovascular: Normal rate and regular rhythm. no murmurs, rubs Respiratory: Effort normal and breath sounds normal. No respiratory distress.  GI: Soft. Bowel sounds are normal. He exhibits no distension.  Neurological: He is alert and oriented to person, place, and time.  Speech is mildly slurred but intelligible. Right central 7. He is oriented to person place and date. Occasional word finding deficits and  processing delays. RUE: 4/5 prox to distal. Decreased FMC. RLE: 3+hf, 4/5 ke, adf/apf. LLE: 4+/5. Decreased proprioception in both legs (chronic)  Skin: Skin is warm and dry.  Psychiatric: He has a normal mood and affect. His behavior is normal   Results for orders placed or performed during the hospital encounter of 02/10/15 (from the past 48 hour(s))  CBG monitoring, ED     Status: Abnormal   Collection Time: 02/10/15 10:02 PM  Result Value Ref Range   Glucose-Capillary 112 (H) 65 - 99 mg/dL   Comment 1 Notify RN   CBC     Status: Abnormal   Collection Time: 02/10/15 10:09 PM  Result Value Ref Range   WBC 2.6 (L) 4.0 - 10.5 K/uL   RBC 3.72 (L) 4.22 - 5.81 MIL/uL   Hemoglobin 11.8 (L) 13.0 - 17.0 g/dL   HCT 35.3 (L) 39.0 - 52.0 %   MCV 94.9 78.0 - 100.0 fL   MCH 31.7 26.0 - 34.0 pg   MCHC 33.4 30.0 - 36.0 g/dL   RDW 16.3 (H) 11.5 - 15.5 %   Platelets 124 (L) 150 - 400 K/uL  Differential     Status: Abnormal   Collection Time: 02/10/15 10:09 PM  Result Value Ref Range  Neutrophils Relative % 61 43 - 77 %   Neutro Abs 1.6 (L) 1.7 - 7.7 K/uL   Lymphocytes Relative 18 12 - 46 %   Lymphs Abs 0.5 (L) 0.7 - 4.0 K/uL   Monocytes Relative 14 (H) 3 - 12 %   Monocytes Absolute 0.4 0.1 - 1.0 K/uL   Eosinophils Relative 6 (H) 0 - 5 %   Eosinophils Absolute 0.1 0.0 - 0.7 K/uL   Basophils Relative 1 0 - 1 %   Basophils Absolute 0.0 0.0 - 0.1 K/uL  Comprehensive metabolic panel     Status: Abnormal   Collection Time: 02/10/15 10:09 PM  Result Value Ref Range   Sodium 138 135 - 145 mmol/L   Potassium 3.8 3.5 - 5.1 mmol/L   Chloride 103 101 - 111 mmol/L   CO2 27 22 - 32 mmol/L   Glucose, Bld 111 (H) 65 - 99 mg/dL   BUN 25 (H) 6 - 20 mg/dL   Creatinine, Ser 1.21 0.61 - 1.24 mg/dL   Calcium 9.0 8.9 - 10.3 mg/dL   Total Protein 5.9 (L) 6.5 - 8.1 g/dL   Albumin 3.5 3.5 - 5.0 g/dL   AST 21 15 - 41 U/L   ALT 19 17 - 63 U/L   Alkaline Phosphatase 56 38 - 126 U/L   Total Bilirubin 0.7 0.3  - 1.2 mg/dL   GFR calc non Af Amer 54 (L) >60 mL/min   GFR calc Af Amer >60 >60 mL/min    Comment: (NOTE) The eGFR has been calculated using the CKD EPI equation. This calculation has not been validated in all clinical situations. eGFR's persistently <60 mL/min signify possible Chronic Kidney Disease.    Anion gap 8 5 - 15  Protime-INR     Status: None   Collection Time: 02/10/15 10:09 PM  Result Value Ref Range   Prothrombin Time 14.6 11.6 - 15.2 seconds   INR 1.12 0.00 - 1.49  CK     Status: None   Collection Time: 02/10/15 10:09 PM  Result Value Ref Range   Total CK 60 49 - 397 U/L  I-stat troponin, ED (not at [facility], [facility])     Status: None   Collection Time: 02/10/15 10:26 PM  Result Value Ref Range   Troponin i, poc 0.00 0.00 - 0.08 ng/mL   Comment 3            Comment: Due to the release kinetics of cTnI, a negative result within the first hours of the onset of symptoms does not rule out myocardial infarction with certainty. If myocardial infarction is still suspected, repeat the test at appropriate intervals.   Urinalysis, Routine w reflex microscopic (not at [facility])     Status: None   Collection Time: 02/10/15 11:55 PM  Result Value Ref Range   Color, Urine YELLOW YELLOW   APPearance CLEAR CLEAR   Specific Gravity, Urine 1.018 1.005 - 1.030   pH 6.5 5.0 - 8.0   Glucose, UA NEGATIVE NEGATIVE mg/dL   Hgb urine dipstick NEGATIVE NEGATIVE   Bilirubin Urine NEGATIVE NEGATIVE   Ketones, ur NEGATIVE NEGATIVE mg/dL   Protein, ur NEGATIVE NEGATIVE mg/dL   Urobilinogen, UA 0.2 0.0 - 1.0 mg/dL   Nitrite NEGATIVE NEGATIVE   Leukocytes, UA NEGATIVE NEGATIVE    Comment: MICROSCOPIC NOT DONE ON URINES WITH NEGATIVE PROTEIN, BLOOD, LEUKOCYTES, NITRITE, OR GLUCOSE <1000 mg/dL.  Lipid panel     Status: None   Collection Time: 02/11/15 12:54 PM  Result Value Ref Range   Cholesterol 165 0 - 200 mg/dL   Triglycerides 96 <150 mg/dL   HDL 70 >40 mg/dL   Total CHOL/HDL Ratio 2.4 RATIO    VLDL 19 0 - 40 mg/dL   LDL Cholesterol 76 0 - 99 mg/dL    Comment:        Total Cholesterol/HDL:CHD Risk Coronary Heart Disease Risk Table                     Men   Women  1/2 Average Risk   3.4   3.3  Average Risk       5.0   4.4  2 X Average Risk   9.6   7.1  3 X Average Risk  23.4   11.0        Use the calculated Patient Ratio above and the CHD Risk Table to determine the patient's CHD Risk.        ATP III CLASSIFICATION (LDL):  <100     mg/dL   Optimal  100-129  mg/dL   Near or Above                    Optimal  130-159  mg/dL   Borderline  160-189  mg/dL   High  >190     mg/dL   Very High   Hemoglobin A1c     Status: Abnormal   Collection Time: 02/11/15  3:39 PM  Result Value Ref Range   Hgb A1c MFr Bld 5.7 (H) 4.8 - 5.6 %    Comment: (NOTE)         Pre-diabetes: 5.7 - 6.4         Diabetes: >6.4         Glycemic control for adults with diabetes: <7.0    Mean Plasma Glucose 117 mg/dL    Comment: (NOTE) Performed At: [facility] [place], [place] [id number] Lindon Romp MD HW:[id number]   Basic metabolic panel     Status: Abnormal   Collection Time: 02/12/15 10:50 AM  Result Value Ref Range   Sodium 138 135 - 145 mmol/L   Potassium 3.5 3.5 - 5.1 mmol/L   Chloride 102 101 - 111 mmol/L   CO2 30 22 - 32 mmol/L   Glucose, Bld 135 (H) 65 - 99 mg/dL   BUN 10 6 - 20 mg/dL   Creatinine, Ser 0.89 0.61 - 1.24 mg/dL   Calcium 8.4 (L) 8.9 - 10.3 mg/dL   GFR calc non Af Amer >60 >60 mL/min   GFR calc Af Amer >60 >60 mL/min    Comment: (NOTE) The eGFR has been calculated using the CKD EPI equation. This calculation has not been validated in all clinical situations. eGFR's persistently <60 mL/min signify possible Chronic Kidney Disease.    Anion gap 6 5 - 15  CBC     Status: Abnormal   Collection Time: 02/12/15 10:50 AM  Result Value Ref Range   WBC 2.6 (L) 4.0 - 10.5 K/uL    Comment: SPECIMEN CHECKED FOR CLOTS REPEATED TO VERIFY     RBC 4.28 4.22 - 5.81 MIL/uL   Hemoglobin 13.5 13.0 - 17.0 g/dL   HCT 39.7 39.0 - 52.0 %   MCV 92.8 78.0 - 100.0 fL   MCH 31.5 26.0 - 34.0 pg   MCHC 34.0 30.0 - 36.0 g/dL   RDW 16.3 (H) 11.5 - 15.5 %   Platelets 110 (L) 150 -  400 K/uL    Comment: PLATELET COUNT CONFIRMED BY SMEAR REPEATED TO VERIFY    Dg Chest 2 View  02/11/2015   CLINICAL DATA:  Weakness  EXAM: CHEST  2 VIEW  COMPARISON:  06/17/2014  FINDINGS: Subpleural opacity in the right upper lobe is asymmetric and could reflect consolidation/pneumonia. This was not seen on scout from 10/17/2014 thoracic spine CT, arguing against neoplastic process. Chronic cardiomegaly. Negative aortic and hilar contours. Ventilation is low.  Multiple myeloma with upper thoracic posterior fixation.  IMPRESSION: 1. Subtle opacity in the right upper lobe, potentially pneumonia. Recommend 3-4 week follow-up after treatment. 2. Hypoventilation.   Electronically Signed   By: Monte Fantasia M.D.   On: 02/11/2015 01:36   Ct Head Wo Contrast  02/11/2015   CLINICAL DATA:  Difficulty with speech with dizziness. Chronic right leg weakness.  EXAM: CT HEAD WITHOUT CONTRAST  TECHNIQUE: Contiguous axial images were obtained from the base of the skull through the vertex without intravenous contrast.  COMPARISON:  None.  FINDINGS: Skull and Sinuses:Negative for fracture or destructive process. The mastoids, middle ears, and imaged paranasal sinuses are clear.  Orbits: No acute abnormality.  Brain: No evidence of acute gray matter infarction, hemorrhage, hydrocephalus, or mass lesion/mass effect. There is generalized brain atrophy with ventriculomegaly, mild. Chronic small-vessel disease with patchy ischemic gliosis throughout the bilateral cerebral white matter. There is ischemic change in the right putamen, likely chronic based on history.  IMPRESSION: 1. No intracranial hemorrhage or definite infarct. 2. Moderate chronic small vessel disease, which could obscure a recent  white matter infarct.   Electronically Signed   By: Monte Fantasia M.D.   On: 02/11/2015 00:20   Mr Brain Wo Contrast  02/11/2015   CLINICAL DATA:  Acute on chronic RIGHT leg weakness, new onset garbled speech. History of melanoma, prostate cancer, stroke.  EXAM: MRI HEAD WITHOUT CONTRAST  MRA HEAD WITHOUT CONTRAST  TECHNIQUE: Multiplanar, multiecho pulse sequences of the brain and surrounding structures were obtained without intravenous contrast. Angiographic images of the head were obtained using MRA technique without contrast.  COMPARISON:  CT of the head February 10, 2015  FINDINGS: MRI HEAD FINDINGS  18 x 9 mm area of reduced diffusion in LEFT basal ganglia (lenticular striate nucleus), corresponding low ADC value consistent with acute ischemia. Corresponding mild FLAIR hyperintense signal. No susceptibility artifact to suggest hemorrhage.  Ventricles and sulci are normal for patient's age. Patchy to confluent supratentorial, pontine white matter T2 hyperintensities without midline shift, mass effect, mass lesions. Innumerable tiny T2 hyperintensities within the basal ganglia compatible with perivascular spaces. In addition, small old RIGHT caudate body lacunar infarct. No abnormal extra-axial fluid collections. Normal major intracranial vascular flow voids seen at the skull base.  Ocular globes and orbital contents are unremarkable. Mild paranasal sinus mucosal thickening without air-fluid levels. Trace LEFT mastoid effusion.  MRA HEAD FINDINGS  Anterior circulation: Normal flow related enhancement of the included cervical, petrous, cavernous and supra clinoid internal carotid arteries. Patent anterior communicating artery. Normal flow related enhancement of the anterior and middle cerebral arteries, including more distal segments.  No large vessel occlusion, high-grade stenosis, aneurysm. Mild middle cerebral artery luminal irregularity.  Posterior circulation: Codominant vertebral artery's. Basilar artery is  patent, with normal flow related enhancement of the main branch vessels. Normal flow related enhancement of the posterior cerebral arteries. Small LEFT posterior communicating artery is present.  No large vessel occlusion, high-grade stenosis, mild luminal irregularity of the posterior cerebral arteries. No aneurysm.  IMPRESSION: MRI HEAD: Acute 18 x 9 mm LEFT basal ganglia lacunar infarct.  Involutional changes. Moderate white matter changes compatible chronic small vessel ischemic disease. Old RIGHT caudate lacunar infarct.  MRA HEAD: No acute large vessel occlusion or high-grade stenosis.  Mild luminal irregularity intracranial vessels compatible with atherosclerosis.   Electronically Signed   By: Elon Alas M.D.   On: 02/11/2015 06:23   Dg Swallowing Func-speech Pathology  02/11/2015    Objective Swallowing Evaluation:    Patient Details  Name: Salar Molden MRN: [id number] Date of Birth: 1930/05/05  Today's Date: 02/11/2015 Time: SLP Start Time (ACUTE ONLY): 1340-SLP Stop Time (ACUTE ONLY): 1400 SLP Time Calculation (min) (ACUTE ONLY): 20 min  Past Medical History:  Past Medical History  Diagnosis Date  . Compression fracture   . Allergy   . Anxiety   . Thyroid disease   . S/P radiation therapy 08/21/14-09/04/14    T4-6 25Gy/5f  . S/P radiation therapy 11/29/14-12/12/14    rt prox humerus/shoulder/scapula 25Gy/12f . Encounter for antineoplastic chemotherapy 01/31/2015  . Melanoma   . Bone cancer     t_spine T-5  . Prostate cancer 2002  . Skin cancer 1994    melanoma  right neck   . Multiple myeloma    Past Surgical History:  Past Surgical History  Procedure Laterality Date  . Hernia repair    . Posterior cervical fusion/foraminotomy N/A 06/17/2014    Procedure: Thoracic five laminectomy for epidural tumor resection  Thoracic 4-7 posterior lateral arthrodesis, segmental pedicle screw  fixation.;  Surgeon: KyAshok PallMD;  [facility];  Service:  Neurosurgery;  Laterality: N/A;   HPI:  Other  Pertinent Information: Jeremiah Owens a [age].o. male with a  history of multiple myeloma treated with Revlamid who presents today with  2 days of right leg weakness and difficulty speaking. He states that this  started quite abruptly. Prior to this, he did not have any problems with  his speech either. He states that he has difficulty with finding his  words, and indeed it is difficult getting history from him because of some  word finding difficulty. MRI shows Acute 18 x 9 mm LEFT basal ganglia  lacunar infarct  No Data Recorded  Assessment / Plan / Recommendation CHL IP CLINICAL IMPRESSIONS 02/11/2015  Therapy Diagnosis Moderate pharyngeal phase dysphagia  Clinical Impression Pt presents with a neuromuscular dysphagia with motor  and sensory deficits due to impairment. There is delayed initiation of the  swallow, particularly dangerous with thin liquids. With all textures there  is weakness of the right lateral wall and possible decreased adduction of  the right arytenoid resulting in silent penetration during the swallow and  residuals in the right lateral channel which are silently aspriated during  and after the swallow. With a head turn right laryngeal closure duirng the  swallow improves and residuals are eliminated. Nectar thick liquids are  still warranted due to delayed timing of swallow. Recommend pt consume a  dys 3 (mechanical soft) diet with nectar thick liquids. Initially pt will  need full supervision to ensure head turn right. As pt demonstrates  independence, supervision can fade. SLP will follow for tolerance.       CHL IP TREATMENT RECOMMENDATION 02/11/2015  Treatment Recommendations Therapy as outlined in treatment plan below     CHL IP DIET RECOMMENDATION 02/11/2015  SLP Diet Recommendations (None)  Liquid Administration via (None)  Medication Administration Whole meds with puree  Compensations Other (  Comment);Small sips/bites;Slow rate  Postural Changes and/or Swallow Maneuvers (None)     CHL  IP OTHER RECOMMENDATIONS 02/11/2015  Recommended Consults (None)  Oral Care Recommendations Oral care BID  Other Recommendations Order thickener from pharmacy     No flowsheet data found.   CHL IP FREQUENCY AND DURATION 02/11/2015  Speech Therapy Frequency (ACUTE ONLY) min 2x/week  Treatment Duration 2 weeks     Pertinent Vitals/Pain NA    SLP Swallow Goals No flowsheet data found.  No flowsheet data found.    CHL IP REASON FOR REFERRAL 02/11/2015  Reason for Referral Objectively evaluate swallowing function               DeBlois, Katherene Ponto 02/11/2015, 2:16 PM    Mr Jodene Nam Head/brain Wo Cm  02/11/2015   CLINICAL DATA:  Acute on chronic RIGHT leg weakness, new onset garbled speech. History of melanoma, prostate cancer, stroke.  EXAM: MRI HEAD WITHOUT CONTRAST  MRA HEAD WITHOUT CONTRAST  TECHNIQUE: Multiplanar, multiecho pulse sequences of the brain and surrounding structures were obtained without intravenous contrast. Angiographic images of the head were obtained using MRA technique without contrast.  COMPARISON:  CT of the head February 10, 2015  FINDINGS: MRI HEAD FINDINGS  18 x 9 mm area of reduced diffusion in LEFT basal ganglia (lenticular striate nucleus), corresponding low ADC value consistent with acute ischemia. Corresponding mild FLAIR hyperintense signal. No susceptibility artifact to suggest hemorrhage.  Ventricles and sulci are normal for patient's age. Patchy to confluent supratentorial, pontine white matter T2 hyperintensities without midline shift, mass effect, mass lesions. Innumerable tiny T2 hyperintensities within the basal ganglia compatible with perivascular spaces. In addition, small old RIGHT caudate body lacunar infarct. No abnormal extra-axial fluid collections. Normal major intracranial vascular flow voids seen at the skull base.  Ocular globes and orbital contents are unremarkable. Mild paranasal sinus mucosal thickening without air-fluid levels. Trace LEFT mastoid effusion.  MRA HEAD  FINDINGS  Anterior circulation: Normal flow related enhancement of the included cervical, petrous, cavernous and supra clinoid internal carotid arteries. Patent anterior communicating artery. Normal flow related enhancement of the anterior and middle cerebral arteries, including more distal segments.  No large vessel occlusion, high-grade stenosis, aneurysm. Mild middle cerebral artery luminal irregularity.  Posterior circulation: Codominant vertebral artery's. Basilar artery is patent, with normal flow related enhancement of the main branch vessels. Normal flow related enhancement of the posterior cerebral arteries. Small LEFT posterior communicating artery is present.  No large vessel occlusion, high-grade stenosis, mild luminal irregularity of the posterior cerebral arteries. No aneurysm.  IMPRESSION: MRI HEAD: Acute 18 x 9 mm LEFT basal ganglia lacunar infarct.  Involutional changes. Moderate white matter changes compatible chronic small vessel ischemic disease. Old RIGHT caudate lacunar infarct.  MRA HEAD: No acute large vessel occlusion or high-grade stenosis.  Mild luminal irregularity intracranial vessels compatible with atherosclerosis.   Electronically Signed   By: Elon Alas M.D.   On: 02/11/2015 06:23       Medical Problem List and Plan: 1. Functional deficits secondary to left basal ganglia infarct 2.  DVT Prophylaxis/Anticoagulation: Fixed dose of Coumadin while maintained on Revlimid 3. Pain Management: Robaxin as needed 4. Dysphagia. Dysphagia #3 nectar liquids. Monitor hydration 5. Neuropsych: This patient is capable of making decisions on his own behalf. 6. Skin/Wound Care: Routine skin checks 7. Fluids/Electrolytes/Nutrition: Routine I&O with follow-up chemistries 8. History of multiple myeloma/spinal metastasis. Chemotherapy ongoing per Dr. Earlie Server weekly 9. Aspiration pneumonia. Unasyn changed to Augmentin 02/12/2015 5  days 10. BPH. Check PVRs 3. Continue  Flomax   Post Admission Physician Evaluation: 1. Functional deficits secondary  to left basal ganglia infarct. 2. Patient is admitted to receive collaborative, interdisciplinary care between the physiatrist, rehab nursing staff, and therapy team. 3. Patient's level of medical complexity and substantial therapy needs in context of that medical necessity cannot be provided at a lesser intensity of care such as a SNF. 4. Patient has experienced substantial functional loss from his/her baseline which was documented above under the "Functional History" and "Functional Status" headings.  Judging by the patient's diagnosis, physical exam, and functional history, the patient has potential for functional progress which will result in measurable gains while on inpatient rehab.  These gains will be of substantial and practical use upon discharge  in facilitating mobility and self-care at the household level. 5. Physiatrist will provide 24 hour management of medical needs as well as oversight of the therapy plan/treatment and provide guidance as appropriate regarding the interaction of the two. 6. 24 hour rehab nursing will assist with bladder management, bowel management, safety, skin/wound care, disease management, medication administration, pain management and patient education  and help integrate therapy concepts, techniques,education, etc. 7. PT will assess and treat for/with: Lower extremity strength, range of motion, stamina, balance, functional mobility, safety, adaptive techniques and equipment, NMR, cognitive perceptual rx, stroke education, family ed.   Goals are: mod I. 8. OT will assess and treat for/with: ADL's, functional mobility, safety, upper extremity strength, adaptive techniques and equipment, NMR, cognitive perceptual awareness, stroke education, family ed.   Goals are: mod I. Therapy may proceed with showering this patient. 9. SLP will assess and treat for/with: speech, swallowing, cognition,  language.  Goals are: mod I. 10. Case Management and Social Worker will assess and treat for psychological issues and discharge planning. 11. Team conference will be held weekly to assess progress toward goals and to determine barriers to discharge. 12. Patient will receive at least 3 hours of therapy per day at least 5 days per week. 13. ELOS: 7-9 days       14. Prognosis:  excellent     Meredith Staggers, MD, [facility] 02/12/2015   02/12/2015

## 2015-02-12 NOTE — PMR Pre-admission (Signed)
PMR Admission Coordinator Pre-Admission Assessment  Patient: Jeremiah Owens is an 81 y.o., male MRN: [id number] DOB: 04-Mar-1930 Height: _0  (175.3 cm) Weight: 73.3 kg (161 lb 9.6 oz)              Insurance Information HMO:     PPO:      PCP:      IPA:      80/20: yes     OTHER: no HMO PRIMARY: Medicare a and b      Policy#: [id number]      Subscriber: pt Benefits:  Phone #: palmetto online     Name: 02/12/2015 Eff. Date: 10/02/96     Deduct: $1288      Out of Pocket Max: none      Life Max: none CIR: 100%      SNF: 20 full days Outpatient: 80%     Co-Pay: 20% Home Health: 100%      Co-Pay: none DME: 80%   Co-Pay: 20% Providers: pt choice  SECONDARY: Tricare for Life      Policy#: [id number]      Subscriber: pt  Medicaid Application Date:       Case Manager:  Disability Application Date:       Case Worker:   Emergency Contact Information Contact Information    Name Relation Home Work Mobile   [place] Spouse [phone]  [phone]   Jorja Loa [phone]     Leven, Hoel   [phone]     Current Medical History  Patient Admitting Diagnosis: left basal ganglia infarct  History of Present Illness: : Jeremiah Owens is a 81 y.o. right handed male with history of multiple myeloma on chemotherapy, radiation therapy for spinal metastasis 08/21/2014-09/04/2014 as well as 11/29/2014-12/12/2014 per Dr. Lisbeth Renshaw and Dr. Earlie Server. Well known to rehabilitation services from admission November 2015 for thoracic spine decompression and fusion related to multiple myeloma. Patient lives with his wife used a rolling walker to ambulate short community distances prior to admission. Presented 02/11/2015 with right sided weakness and slurred speech 2 days. MRI of the brain showed acute left basal ganglia lacunar infarction as well as old right caudate lacunar infarct. MRA of the head with no large vessel occlusion or stenosis. Carotid Doppler but no ICA stenosis. Echocardiogram pending.  Patient did not receive TPA. Neurology consulted presently on aspirin for CVA prophylaxis as well as subcutaneous heparin for DVT prophylaxis. Suspect aspiration pneumonia currently maintained on Unasyn. Dysphagia #3 nectar thick liquid diet. He continues to receive weekly chemotherapy at [facility].   Total: 3 NIH    Past Medical History  Past Medical History  Diagnosis Date  . Compression fracture   . Allergy   . Anxiety   . Thyroid disease   . S/P radiation therapy 08/21/14-09/04/14    T4-6 25Gy/79f  . S/P radiation therapy 11/29/14-12/12/14    rt prox humerus/shoulder/scapula 25Gy/140f . Encounter for antineoplastic chemotherapy 01/31/2015  . Melanoma   . Bone cancer     t_spine T-5  . Prostate cancer 2002  . Skin cancer 1994    melanoma  right neck   . Multiple myeloma     Family History  family history includes Cancer in his mother.  Prior Rehab/Hospitalizations:  Has the patient had major surgery during 100 days prior to admission? No Received CIR 06/2015 after back surgery OP and HH also  Current Medications   Current facility-administered medications:  .  acetaminophen (TYLENOL) tablet 650 mg, 650 mg, Oral, Q4H PRN **  OR** acetaminophen (TYLENOL) suppository 650 mg, 650 mg, Rectal, Q4H PRN, Lavina Hamman, MD .  Ampicillin-Sulbactam (UNASYN) 3 g in sodium chloride 0.9 % 100 mL IVPB, 3 g, Intravenous, 4 times per day, Lavina Hamman, MD, Last Rate: 100 mL/hr at 02/12/15 1236, 3 g at 02/12/15 1236 .  aspirin suppository 300 mg, 300 mg, Rectal, Daily **OR** aspirin tablet 325 mg, 325 mg, Oral, Daily, Lavina Hamman, MD, 325 mg at 02/12/15 1027 .  heparin injection 5,000 Units, 5,000 Units, Subcutaneous, 3 times per day, Lavina Hamman, MD, 5,000 Units at 02/12/15 1446 .  NONFORMULARY OR COMPOUNDED ITEM 25 mg, 25 mg, Oral, QHS, Verlee Monte, MD, 25 mg at 02/11/15 2102 .  RESOURCE THICKENUP CLEAR, , Oral, PRN, Verlee Monte, MD  Patients Current Diet: DIET DYS 3  Room service appropriate?: Yes; Fluid consistency:: Nectar Thick Diet - low sodium heart healthy  Precautions / Restrictions Precautions Precautions: Fall Restrictions Weight Bearing Restrictions: No   Has the patient had 2 or more falls or a fall with injury in the past year?No  Prior Activity Level Limited Community (1-2x/wk): Pt drives self and was I  Development worker, international aid / Paramedic Devices/Equipment: Cabin crew, Eyeglasses Home Equipment: Environmental consultant - 2 wheels, Environmental consultant - 4 wheels, [place] - single point, Bedside commode, Wheelchair - manual, Grab bars - toilet, Grab bars - tub/shower, Shower seat - built in  Prior Device Use: Indicate devices/aids used by the patient prior to current illness, exacerbation or injury? Walker  Prior Functional Level Prior Function Level of Independence: Needs assistance Gait / Transfers Assistance Needed: used a RW, able to amb short community distances ADL's / Hydrologist Assistance Needed: minimal assist for dressing/bathing occasionally  Self Care: Did the patient need help bathing, dressing, using the toilet or eating?  Needed some help  Indoor Mobility: Did the patient need assistance with walking from room to room (with or without device)? Independent  Stairs: Did the patient need assistance with internal or external stairs (with or without device)? Independent  Functional Cognition: Did the patient need help planning regular tasks such as shopping or remembering to take medications? Independent  Current Functional Level Cognition  Overall Cognitive Status: Within Functional Limits for tasks assessed Orientation Level: Oriented X4    Extremity Assessment (includes Sensation/Coordination)  Upper Extremity Assessment: Generalized weakness, LUE deficits/detail LUE Deficits / Details: pt with AROM at shoulder to approximately 45 degrees. Pt states arthritis in L shoulder. other joints WFL.   Lower Extremity Assessment:  Generalized weakness (R side weaker than the L)    ADLs  Overall ADL's : Needs assistance/impaired Eating/Feeding: Sitting, Supervision/ safety Grooming: Wash/dry hands, Set up, Sitting Upper Body Bathing: Supervision/ safety, Set up, Sitting Lower Body Bathing: Moderate assistance, Sit to/from stand Upper Body Dressing : Sitting, Minimal assistance Lower Body Dressing: Moderate assistance, Sit to/from stand Toilet Transfer: Minimal assistance, Moderate assistance, Ambulation, RW, Comfort height toilet Toileting- Clothing Manipulation and Hygiene: Moderate assistance, Sit to/from stand General ADL Comments: Pt with some difficulty manuevering throughout tight space with walker into bathroom and around door.  Noted BM smudege on chair pad so assisted pt to the bathroom to have BM and for hygiene/cleanup. Pt able to manage wiping/hygiene from seated position with increased time. Cues for hand placement with functional transfers and for better placement of hands on walker for safety. Pt able to cross LEs up for donning/doffing socks.     Mobility  Overal bed mobility: Needs Assistance Bed Mobility:  Supine to Sit Supine to sit: Mod assist General bed mobility comments: in chair.     Transfers  Overall transfer level: Needs assistance Equipment used: Rolling walker (2 wheeled) Transfers: Sit to/from Stand Sit to Stand: Min assist General transfer comment: cues for hand placement.     Ambulation / Gait / Stairs / Wheelchair Mobility  Ambulation/Gait Ambulation/Gait assistance: Min assist (+1 for chair follow) Ambulation Distance (Feet): 75 Feet Assistive device: Rolling walker (2 wheeled) Gait Pattern/deviations: Decreased stride length, Step-through pattern, Decreased weight shift to right, Narrow base of support General Gait Details: max v/c's to increase base of support as pt was amb with narrow base of support/near crossover. increased dependence on UEs Gait velocity interpretation:  Below normal speed for age/gender    Posture / Balance Balance Overall balance assessment: Needs assistance Sitting-balance support: Single extremity supported Sitting balance-Leahy Scale: Fair (note some leaning backwards with trying to scoot forward to edge fo chair.) Standing balance support: Bilateral upper extremity supported Standing balance-Leahy Scale: Poor Standing balance comment: pt stood x 2 min for hygiene s/p BM. pt aware but did not call    Special needs/care consideration  Bowel mgmt: LBM 7/11 incontinent due to meds. Wears depends?wife to provide Bladder mgmt: incontinent Receiving q wed chemo injection pta   Previous Home Environment Living Arrangements: Spouse/significant other  Lives With: Spouse Available Help at Discharge: Family, Available 24 hours/day Type of Home: House Home Layout: One level Home Access: Ramped entrance Bathroom Shower/Tub: Multimedia programmer: Handicapped height [place]: No Additional Comments: home fully handicapped accessible  Discharge Living Setting Plans for Discharge Living Setting: Patient's home, Lives with (comment), Other (Comment) (wife) Type of Home at Discharge: House Discharge Home Layout: One level Discharge Home Access: [place] entrance Discharge Bathroom Shower/Tub: Walk-in shower Discharge Bathroom Toilet: Handicapped height Discharge Bathroom Accessibility: Yes How Accessible: Accessible via walker, Accessible via wheelchair Does the patient have any problems obtaining your medications?: No  Social/Family/Support Systems Patient Roles: Spouse, Parent, Other (Comment) (has one sin, Fritz Pickerel , who lives in [place]) Peter Information: Stanton Kidney, wife Anticipated Caregiver: wife, she is 4 yo and Independent Anticipated Ambulance person Information: see above Ability/Limitations of Caregiver: can provide supervision to min guard Caregiver Availability: 24/7 Discharge Plan Discussed with Primary  Caregiver: Yes Is Caregiver In Agreement with Plan?: Yes Does Caregiver/Family have Issues with Lodging/Transportation while Pt is in Rehab?: No Son , larry, in [place], who is on a camping trip with his sons and unaware of pt's admission. He has requested he not be contacted. Pt's wife on 7/11 may contact him  Goals/Additional Needs Patient/Family Goal for Rehab: Mod I with PT, Mod I to supervision with OT and SLP Expected length of stay: ELOS 7-9 days Equipment Needs: Takes Chemo q wednesday. He states a Therapist, occupational to Admission and willing to participate: Yes Program Orientation Provided & Reviewed with Pt/Caregiver Including Roles  & Responsibilities: Yes   Decrease burden of Care through IP rehab admission: n/a  Possible need for SNF placement upon discharge: not anticipated  Patient Condition: This patient's condition remains as documented in the consult dated 02/12/2015 , in which the Rehabilitation Physician determined and documented that the patient's condition is appropriate for intensive rehabilitative care in an inpatient rehabilitation facility. Will admit to inpatient rehab today.  Preadmission Screen Completed By:  Cleatrice Burke, 02/12/2015 3:11 PM ______________________________________________________________________   Discussed status with Dr. Naaman Plummer on 02/12/2015 at  1510 and received telephone approval for admission today.  Admission Coordinator:  Cleatrice Burke, time 4801 Date 02/12/2015.

## 2015-02-12 NOTE — Progress Notes (Signed)
I met with pt at bedside to discuss an admission to inpt rehab. He was previously admitted 06/2014 and did well. Pt is in agreement. I will make the arrangements for admit today. I contacted Dr. Hartford Poli and he is in agreement. RN CM is aware. I contacted pt's wife by phone and she is also in agreement. [phone]

## 2015-02-12 NOTE — Evaluation (Addendum)
Occupational Therapy Evaluation Patient Details Name: Jeremiah Owens MRN: [id number] DOB: August 18, 1929 Today's Date: 02/12/2015    History of Present Illness Jeremiah Owens is a 81 y.o. male with Past medical history of multiple myeloma on chemotherapy, radiation therapy for spinal metastasis, hypothyroidism, gait imbalance. MRI revealed L basal ganglia lacunar infarct.   Clinical Impression   Pt noted to have difficulty with sit to stand transitions as well as with safely managing walker into the bathroom for toilet transfers. He requires min to mod assist overall with ADL currently. Note some posterior LOB with trying to scoot forward to edge of chair to stand. Pt with word finding difficulty and requires increased time to try to communicate thoughts. Pt will benefit from OT to progress ADL independence for next venue. Will follow.    Follow Up Recommendations  CIR    Equipment Recommendations  None recommended by OT    Recommendations for Other Services       Precautions / Restrictions Precautions Precautions: Fall Restrictions Weight Bearing Restrictions: No      Mobility Bed Mobility           General bed mobility comments: in chair.   Transfers Overall transfer level: Needs assistance Equipment used: Rolling walker (2 wheeled) Transfers: Sit to/from Stand Sit to Stand: Min assist         General transfer comment: cues for hand placement.     Balance Overall balance assessment: Needs assistance Sitting-balance support: Single extremity supported Sitting balance-Leahy Scale: Fair (note some leaning backwards with trying to scoot forward to edge fo chair.)     Standing balance support: Bilateral upper extremity supported Standing balance-Leahy Scale: Poor                             ADL Overall ADL's : Needs assistance/impaired Eating/Feeding: Sitting;Supervision/ safety   Grooming: Wash/dry hands;Set up;Sitting   Upper Body Bathing:  Supervision/ safety;Set up;Sitting   Lower Body Bathing: Moderate assistance;Sit to/from stand   Upper Body Dressing : Sitting;Minimal assistance   Lower Body Dressing: Moderate assistance;Sit to/from stand   Toilet Transfer: Minimal assistance;Moderate assistance;Ambulation;RW;Comfort height toilet   Toileting- Clothing Manipulation and Hygiene: Moderate assistance;Sit to/from stand         General ADL Comments: Pt with some difficulty manuevering throughout tight space with walker into bathroom and around door.  Noted BM smudege on chair pad so assisted pt to the bathroom to have BM and for hygiene/cleanup. Pt able to manage wiping/hygiene from seated position with increased time. Cues for hand placement with functional transfers and for better placement of hands on walker for safety. Pt able to cross LEs up for donning/doffing socks. Pt with no difficulty using R hand to manage/manipulate toilet paper or wipe himself.      Vision     Perception     Praxis      Pertinent Vitals/Pain Pain Assessment: No/denies pain     Hand Dominance Right   Extremity/Trunk Assessment Upper Extremity Assessment Upper Extremity Assessment: Generalized weakness;LUE deficits/detail LUE Deficits / Details: pt with AROM at shoulder to approximately 45 degrees. Pt states arthritis in L shoulder. other joints WFL.       Cervical / Trunk Assessment Cervical / Trunk Assessment: Kyphotic   Communication Communication Communication: HOH;Expressive difficulties (bilat hearing aids,word finding difficulties)   Cognition Arousal/Alertness: Awake/alert Behavior During Therapy: WFL for tasks assessed/performed Overall Cognitive Status: Within Functional Limits for tasks assessed  General Comments       Exercises       Shoulder Instructions      Home Living Family/patient expects to be discharged to:: Private residence Living Arrangements: Spouse/significant  other Available Help at Discharge: Family;Available 24 hours/day Type of Home: House Home Access: Ramped entrance     Home Layout: One level     Bathroom Shower/Tub: Occupational psychologist: Handicapped height     Home Equipment: Environmental consultant - 2 wheels;Walker - 4 wheels;Cane - single point;Bedside commode;Wheelchair - manual;Grab bars - toilet;Grab bars - tub/shower;Shower seat - built in          Prior Functioning/Environment Level of Independence: Needs assistance  Gait / Transfers Assistance Needed: used a RW, able to amb short community distances ADL's / Hydrologist Assistance Needed: minimal assist for dressing/bathing occasionally        OT Diagnosis: Generalized weakness   OT Problem List: Decreased strength;Decreased knowledge of use of DME or AE   OT Treatment/Interventions: Self-care/ADL training;Patient/family education;Therapeutic activities;DME and/or AE instruction    OT Goals(Current goals can be found in the care plan section) Acute Rehab OT Goals Patient Stated Goal: rehab to home OT Goal Formulation: With patient Time For Goal Achievement: 02/26/15 Potential to Achieve Goals: Good  OT Frequency: Min 2X/week   Barriers to D/C:            Co-evaluation              End of Session Equipment Utilized During Treatment: Gait belt;Rolling walker  Activity Tolerance: Patient tolerated treatment well Patient left: in chair;with call bell/phone within reach;with chair alarm set   Time: [id number] OT Time Calculation (min): 34 min Charges:  OT General Charges $OT Visit: 1 Procedure OT Evaluation $Initial OT Evaluation Tier I: 1 Procedure OT Treatments $Therapeutic Activity: 8-22 mins G-Codes:    Jeremiah Owens  [phone] 02/12/2015, 10:58 AM

## 2015-02-12 NOTE — Progress Notes (Signed)
Pt transferred to Rehab from 4N. Alert and orientated x4 with eife at bedside. Family declined orientation information, as he was in [place], November 2015. Diagnostic specific information provided.

## 2015-02-12 NOTE — Progress Notes (Signed)
Physical Medicine and Rehabilitation Consult Reason for Consult: Left basal ganglia lacunar infarction/history of multiple myeloma Referring Physician: [facility]   HPI: Jeremiah Owens is a 81 y.o. right handed male with history of multiple myeloma on chemotherapy, radiation therapy for spinal metastasis 08/21/2014-09/04/2014 as well as 11/29/2014-12/12/2014 per Dr. Lisbeth Renshaw and Dr. Earlie Server. Well known to rehabilitation services from admission November 2015 for thoracic spine decompression and fusion related to multiple myeloma. Patient lives with his wife used a rolling walker to ambulate short community distances prior to admission. Presented 02/11/2015 with right sided weakness and slurred speech 2 days. MRI of the brain showed acute left basal ganglia lacunar infarction as well as old right caudate lacunar infarct. MRA of the head with no large vessel occlusion or stenosis. Carotid Doppler but no ICA stenosis. Echocardiogram pending. Patient did not receive TPA. Neurology consulted presently on aspirin for CVA prophylaxis as well as subcutaneous heparin for DVT prophylaxis. Suspect aspiration pneumonia currently maintained on Unasyn. Dysphagia #3 nectar thick liquid diet. He continues to receive weekly chemotherapy at [facility]. Physical therapy evaluation completed 02/12/2015 with recommendations of physical medicine rehabilitation consult.   Review of Systems  Constitutional: Negative for fever and chills.  HENT: Positive for hearing loss.  Eyes: Negative for double vision.  Respiratory: Negative for cough and hemoptysis.  Cardiovascular: Negative for chest pain and palpitations.  Gastrointestinal: Positive for constipation.  Genitourinary: Positive for urgency.  Musculoskeletal: Positive for myalgias, back pain and joint pain.  Skin: Negative for rash.  Neurological: Positive for speech change and weakness. Negative for headaches.  Psychiatric/Behavioral: Positive for memory loss.    Anxiety   Past Medical History  Diagnosis Date  . Compression fracture   . Allergy   . Anxiety   . Thyroid disease   . S/P radiation therapy 08/21/14-09/04/14    T4-6 25Gy/53f  . S/P radiation therapy 11/29/14-12/12/14    rt prox humerus/shoulder/scapula 25Gy/151f . Encounter for antineoplastic chemotherapy 01/31/2015  . Melanoma   . Bone cancer     t_spine T-5  . Prostate cancer 2002  . Skin cancer 1994    melanoma right neck   . Multiple myeloma    Past Surgical History  Procedure Laterality Date  . Hernia repair    . Posterior cervical fusion/foraminotomy N/A 06/17/2014    Procedure: Thoracic five laminectomy for epidural tumor resection Thoracic 4-7 posterior lateral arthrodesis, segmental pedicle screw fixation.; Surgeon: KyAshok PallMD; Location: [facility]; Service: Neurosurgery; Laterality: N/A;   Family History  Problem Relation Age of Onset  . Cancer Mother     breast   Social History:  reports that he has never smoked. He does not have any smokeless tobacco history on file. He reports that he does not drink alcohol or use illicit drugs. Allergies:  Allergies  Allergen Reactions  . Iodine Itching    Patient had a reaction 20 years ago and can't remember what kind of reaction he had. Patient was given decadron in the ER and had an Angio ordered and did test with no problems. Bottom line "patient needs to pre-medicated before any IV dye is given".   Medications Prior to Admission  Medication Sig Dispense Refill  . acyclovir (ZOVIRAX) 400 MG tablet TAKE 1 TABLET BY MOUTH TWICE A DAY 60 tablet 2  . Calcium Carbonate-Vitamin D (CALTRATE 600+D PO) Take 600 mg by mouth 2 (two) times daily.    . cholecalciferol (VITAMIN D) 1000 UNITS tablet Take 1,000 Units by mouth at  bedtime.     . furosemide (LASIX) 20 MG tablet Take 1 tablet (20 mg total) by  mouth daily. As needed for swelling (Patient taking differently: Take 20 mg by mouth 2 (two) times daily. As needed for swelling) 10 tablet 0  . HYDROcodone-acetaminophen (NORCO) 5-325 MG per tablet Take 1 tablet by mouth every 6 (six) hours as needed for moderate pain. 60 tablet 0  . HYDROCORTISONE, TOPICAL, 2.5 % SOLN Apply 1 application topically 3 (three) times daily as needed (for itching).     Marland Kitchen lenalidomide (REVLIMID) 25 MG capsule Take 1 capsule (25 mg total) by mouth daily. Take one capsule ( 25 mg) by mouth for 21 days every 4 weeks-01/31/15 Authorization number=[id number] adult male 21 capsule 0  . levothyroxine (SYNTHROID, LEVOTHROID) 25 MCG tablet Take 1 tablet (25 mcg total) by mouth daily. 30 tablet 4  . methocarbamol (ROBAXIN) 500 MG tablet TAKE 1 TABLET (500 MG TOTAL) BY MOUTH EVERY 6 (SIX) HOURS AS NEEDED FOR MUSCLE SPASMS. 60 tablet 0  . Multiple Vitamins-Minerals (CENTRUM SILVER PO) Take 1 tablet by mouth daily.    Marland Kitchen omeprazole (PRILOSEC) 40 MG capsule Take 40 mg by mouth daily.    . Polyvinyl Alcohol-Povidone (REFRESH OP) Apply 1 drop to eye 4 (four) times daily as needed (dry/irritated eyes).     . potassium chloride (K-DUR,KLOR-CON) 10 MEQ tablet Take 10 mEq by mouth daily.    . prochlorperazine (COMPAZINE) 10 MG tablet Take 1 tablet (10 mg total) by mouth every 6 (six) hours as needed for nausea or vomiting. 30 tablet 0  . sennosides-docusate sodium (SENOKOT-S) 8.6-50 MG tablet Take 1-2 tablets by mouth 2 (two) times daily as needed for constipation. Takes 1-2 tabs daily    . tamsulosin (FLOMAX) 0.4 MG CAPS capsule Take 1 capsule (0.4 mg total) by mouth at bedtime. 30 capsule 3  . warfarin (COUMADIN) 2 MG tablet Take 1 tablet (2 mg total) by mouth daily. 30 tablet 3  . dexamethasone (DECADRON) 4 MG tablet 10 TAB EVERY WEEK, START WITH CHEMO 40 tablet 4  . ranitidine (ZANTAC) 150 MG tablet Take 150 mg by  mouth daily as needed for heartburn.   11    Home: Home Living Family/patient expects to be discharged to:: Private residence Living Arrangements: Spouse/significant other Available Help at Discharge: Family, Available 24 hours/day Type of Home: House Home Access: [place]: One [place]: Environmental consultant - 2 wheels, Environmental consultant - 4 wheels, [place] - single point, Bedside commode, Shower seat, Wheelchair - manual, Grab bars - toilet, Grab bars - tub/shower  Functional History: Prior Function Level of Independence: Needs assistance Gait / Transfers Assistance Needed: used a RW, able to amb short community distances ADL's / [employer] Assistance Needed: minimal assist for dressing/bathing occasionally Functional Status:  Mobility: Bed Mobility Overal bed mobility: Needs Assistance Bed Mobility: Supine to Sit Supine to sit: Mod assist General bed mobility comments: assist to bring hips to EOB, pt able to push self up in sitting using handrails but unable to scoot to EOB so feet were on the floor Transfers Overall transfer level: Needs assistance Equipment used: Rolling walker (2 wheeled) Transfers: Sit to/from Stand Sit to Stand: Min assist General transfer comment: v/c's for safe hand placement, increased time, pt urinary incontinent up standing and unaware Ambulation/Gait Ambulation/Gait assistance: Min assist (+1 for chair follow) Ambulation Distance (Feet): 75 Feet Assistive device: Rolling walker (2 wheeled) General Gait Details: max v/c's to increase base of support as pt  was amb with narrow base of support/near crossover. increased dependence on UEs Gait Pattern/deviations: Decreased stride length, Step-through pattern, Decreased weight shift to right, Narrow base of support Gait velocity interpretation: Below normal speed for age/gender    ADL:    Cognition: Cognition Overall Cognitive Status: Within Functional Limits for tasks assessed Orientation  Level: Oriented X4 Cognition Arousal/Alertness: Awake/alert Behavior During Therapy: WFL for tasks assessed/performed Overall Cognitive Status: Within Functional Limits for tasks assessed  Blood pressure 120/55, pulse 78, temperature 97 F (36.1 C), temperature source Oral, resp. rate 16, height '5\' 9"'  (1.753 m), weight 73.3 kg (161 lb 9.6 oz), SpO2 98 %. Physical Exam  Constitutional: He is oriented to person, place, and time.  HENT:  Mild facial droop  Eyes: EOM are normal.  Neck: Normal range of motion. Neck supple. No thyromegaly present.  Cardiovascular: Normal rate and regular rhythm.  Respiratory: Effort normal and breath sounds normal. No respiratory distress.  GI: Soft. Bowel sounds are normal. He exhibits no distension.  Neurological: He is alert and oriented to person, place, and time.  Speech is mildly slurred but intelligible. Right central 7. He is oriented to person place and date of birth but limited medical historian. Occasional word finding deficits. RUE: 4/5 prox to distal. Decreased FMC. RLE: 3+hf, 4/5 ke, adf/apf. LLE: 4+/5. Decreased proprioception in both legs (chronic)  Skin: Skin is warm and dry.  Psychiatric: He has a normal mood and affect. His behavior is normal.     Lab Results Last 24 Hours    Results for orders placed or performed during the hospital encounter of 02/10/15 (from the past 24 hour(s))  Lipid panel Status: None   Collection Time: 02/11/15 12:54 PM  Result Value Ref Range   Cholesterol 165 0 - 200 mg/dL   Triglycerides 96 <150 mg/dL   HDL 70 >40 mg/dL   Total CHOL/HDL Ratio 2.4 RATIO   VLDL 19 0 - 40 mg/dL   LDL Cholesterol 76 0 - 99 mg/dL      Imaging Results (Last 48 hours)    Dg Chest 2 View  02/11/2015 CLINICAL DATA: Weakness EXAM: CHEST 2 VIEW COMPARISON: 06/17/2014 FINDINGS: Subpleural opacity in the right upper lobe is asymmetric and could reflect consolidation/pneumonia. This was not  seen on scout from 10/17/2014 thoracic spine CT, arguing against neoplastic process. Chronic cardiomegaly. Negative aortic and hilar contours. Ventilation is low. Multiple myeloma with upper thoracic posterior fixation. IMPRESSION: 1. Subtle opacity in the right upper lobe, potentially pneumonia. Recommend 3-4 week follow-up after treatment. 2. Hypoventilation. Electronically Signed By: Monte Fantasia M.D. On: 02/11/2015 01:36   Ct Head Wo Contrast  02/11/2015 CLINICAL DATA: Difficulty with speech with dizziness. Chronic right leg weakness. EXAM: CT HEAD WITHOUT CONTRAST TECHNIQUE: Contiguous axial images were obtained from the base of the skull through the vertex without intravenous contrast. COMPARISON: None. FINDINGS: Skull and Sinuses:Negative for fracture or destructive process. The mastoids, middle ears, and imaged paranasal sinuses are clear. Orbits: No acute abnormality. Brain: No evidence of acute gray matter infarction, hemorrhage, hydrocephalus, or mass lesion/mass effect. There is generalized brain atrophy with ventriculomegaly, mild. Chronic small-vessel disease with patchy ischemic gliosis throughout the bilateral cerebral white matter. There is ischemic change in the right putamen, likely chronic based on history. IMPRESSION: 1. No intracranial hemorrhage or definite infarct. 2. Moderate chronic small vessel disease, which could obscure a recent white matter infarct. Electronically Signed By: Monte Fantasia M.D. On: 02/11/2015 00:20   Mr Brain Wo Contrast  02/11/2015  CLINICAL DATA: Acute on chronic RIGHT leg weakness, new onset garbled speech. History of melanoma, prostate cancer, stroke. EXAM: MRI HEAD WITHOUT CONTRAST MRA HEAD WITHOUT CONTRAST TECHNIQUE: Multiplanar, multiecho pulse sequences of the brain and surrounding structures were obtained without intravenous contrast. Angiographic images of the head were obtained using MRA technique without contrast.  COMPARISON: CT of the head February 10, 2015 FINDINGS: MRI HEAD FINDINGS 18 x 9 mm area of reduced diffusion in LEFT basal ganglia (lenticular striate nucleus), corresponding low ADC value consistent with acute ischemia. Corresponding mild FLAIR hyperintense signal. No susceptibility artifact to suggest hemorrhage. Ventricles and sulci are normal for patient's age. Patchy to confluent supratentorial, pontine white matter T2 hyperintensities without midline shift, mass effect, mass lesions. Innumerable tiny T2 hyperintensities within the basal ganglia compatible with perivascular spaces. In addition, small old RIGHT caudate body lacunar infarct. No abnormal extra-axial fluid collections. Normal major intracranial vascular flow voids seen at the skull base. Ocular globes and orbital contents are unremarkable. Mild paranasal sinus mucosal thickening without air-fluid levels. Trace LEFT mastoid effusion. MRA HEAD FINDINGS Anterior circulation: Normal flow related enhancement of the included cervical, petrous, cavernous and supra clinoid internal carotid arteries. Patent anterior communicating artery. Normal flow related enhancement of the anterior and middle cerebral arteries, including more distal segments. No large vessel occlusion, high-grade stenosis, aneurysm. Mild middle cerebral artery luminal irregularity. Posterior circulation: Codominant vertebral artery's. Basilar artery is patent, with normal flow related enhancement of the main branch vessels. Normal flow related enhancement of the posterior cerebral arteries. Small LEFT posterior communicating artery is present. No large vessel occlusion, high-grade stenosis, mild luminal irregularity of the posterior cerebral arteries. No aneurysm. IMPRESSION: MRI HEAD: Acute 18 x 9 mm LEFT basal ganglia lacunar infarct. Involutional changes. Moderate white matter changes compatible chronic small vessel ischemic disease. Old RIGHT caudate lacunar infarct. MRA  HEAD: No acute large vessel occlusion or high-grade stenosis. Mild luminal irregularity intracranial vessels compatible with atherosclerosis. Electronically Signed By: Elon Alas M.D. On: 02/11/2015 06:23   Dg Swallowing Func-speech Pathology  02/11/2015 Objective Swallowing Evaluation: Patient Details Name: Jeremiah Owens MRN: [id number] Date of Birth: Jul 12, 1930 Today's Date: 02/11/2015 Time: SLP Start Time (ACUTE ONLY): 1340-SLP Stop Time (ACUTE ONLY): 1400 SLP Time Calculation (min) (ACUTE ONLY): 20 min Past Medical History: Past Medical History Diagnosis Date . Compression fracture . Allergy . Anxiety . Thyroid disease . S/P radiation therapy 08/21/14-09/04/14 T4-6 25Gy/33f . S/P radiation therapy 11/29/14-12/12/14 rt prox humerus/shoulder/scapula 25Gy/173f. Encounter for antineoplastic chemotherapy 01/31/2015 . Melanoma . Bone cancer t_spine T-5 . Prostate cancer 2002 . Skin cancer 1994 melanoma right neck . Multiple myeloma Past Surgical History: Past Surgical History Procedure Laterality Date . Hernia repair  . Posterior cervical fusion/foraminotomy N/A 06/17/2014 Procedure: Thoracic five laminectomy for epidural tumor resection Thoracic 4-7 posterior lateral arthrodesis, segmental pedicle screw fixation.; Surgeon: KyAshok PallMD; Location: [facility]; Service: Neurosurgery; Laterality: N/A; HPI: Other Pertinent Information: RoKeldon Lassens a [age].o. male with a history of multiple myeloma treated with Revlamid who presents today with 2 days of right leg weakness and difficulty speaking. He states that this started quite abruptly. Prior to this, he did not have any problems with his speech either. He states that he has difficulty with finding his words, and indeed it is difficult getting history from him because of some word finding difficulty. MRI shows Acute 18 x 9 mm LEFT basal ganglia lacunar infarct No Data Recorded  Assessment / Plan / Recommendation CHL IP CLINICAL  IMPRESSIONS 02/11/2015 Therapy Diagnosis Moderate pharyngeal phase dysphagia Clinical Impression Pt presents with a neuromuscular dysphagia with motor and sensory deficits due to impairment. There is delayed initiation of the swallow, particularly dangerous with thin liquids. With all textures there is weakness of the right lateral wall and possible decreased adduction of the right arytenoid resulting in silent penetration during the swallow and residuals in the right lateral channel which are silently aspriated during and after the swallow. With a head turn right laryngeal closure duirng the swallow improves and residuals are eliminated. Nectar thick liquids are still warranted due to delayed timing of swallow. Recommend pt consume a dys 3 (mechanical soft) diet with nectar thick liquids. Initially pt will need full supervision to ensure head turn right. As pt demonstrates independence, supervision can fade. SLP will follow for tolerance. CHL IP TREATMENT RECOMMENDATION 02/11/2015 Treatment Recommendations Therapy as outlined in treatment plan below CHL IP DIET RECOMMENDATION 02/11/2015 SLP Diet Recommendations (None) Liquid Administration via (None) Medication Administration Whole meds with puree Compensations Other (Comment);Small sips/bites;Slow rate Postural Changes and/or Swallow Maneuvers (None) CHL IP OTHER RECOMMENDATIONS 02/11/2015 Recommended Consults (None) Oral Care Recommendations Oral care BID Other Recommendations Order thickener from pharmacy No flowsheet data found. CHL IP FREQUENCY AND DURATION 02/11/2015 Speech Therapy Frequency (ACUTE ONLY) min 2x/week Treatment Duration 2 weeks Pertinent Vitals/Pain NA SLP Swallow Goals No flowsheet data found. No flowsheet data found. CHL IP REASON FOR REFERRAL 02/11/2015 Reason for Referral Objectively evaluate swallowing function DeBlois, Katherene Ponto 02/11/2015, 2:16 PM   Mr Jodene Nam Head/brain Wo Cm  02/11/2015 CLINICAL DATA: Acute on chronic RIGHT leg weakness, new onset garbled speech. History of melanoma, prostate cancer, stroke. EXAM: MRI HEAD WITHOUT CONTRAST MRA HEAD WITHOUT CONTRAST TECHNIQUE: Multiplanar, multiecho pulse sequences of the brain and surrounding structures were obtained without intravenous contrast. Angiographic images of the head were obtained using MRA technique without contrast. COMPARISON: CT of the head February 10, 2015 FINDINGS: MRI HEAD FINDINGS 18 x 9 mm area of reduced diffusion in LEFT basal ganglia (lenticular striate nucleus), corresponding low ADC value consistent with acute ischemia. Corresponding mild FLAIR hyperintense signal. No susceptibility artifact to suggest hemorrhage. Ventricles and sulci are normal for patient's age. Patchy to confluent supratentorial, pontine white matter T2 hyperintensities without midline shift, mass effect, mass lesions. Innumerable tiny T2 hyperintensities within the basal ganglia compatible with perivascular spaces. In addition, small old RIGHT caudate body lacunar infarct. No abnormal extra-axial fluid collections. Normal major intracranial vascular flow voids seen at the skull base. Ocular globes and orbital contents are unremarkable. Mild paranasal sinus mucosal thickening without air-fluid levels. Trace LEFT mastoid effusion. MRA HEAD FINDINGS Anterior circulation: Normal flow related enhancement of the included cervical, petrous, cavernous and supra clinoid internal carotid arteries. Patent anterior communicating artery. Normal flow related enhancement of the anterior and middle cerebral arteries, including more distal segments. No large vessel occlusion, high-grade stenosis, aneurysm. Mild middle cerebral artery luminal irregularity. Posterior circulation: Codominant vertebral artery's. Basilar artery is patent, with normal flow related enhancement of the main branch  vessels. Normal flow related enhancement of the posterior cerebral arteries. Small LEFT posterior communicating artery is present. No large vessel occlusion, high-grade stenosis, mild luminal irregularity of the posterior cerebral arteries. No aneurysm. IMPRESSION: MRI HEAD: Acute 18 x 9 mm LEFT basal ganglia lacunar infarct. Involutional changes. Moderate white matter changes compatible chronic small vessel ischemic disease. Old RIGHT caudate lacunar infarct. MRA HEAD: No acute large vessel occlusion or high-grade stenosis. Mild luminal irregularity intracranial vessels  compatible with atherosclerosis. Electronically Signed By: Elon Alas M.D. On: 02/11/2015 06:23     Assessment/Plan: Diagnosis: left basal ganglia infarct 1. Does the need for close, 24 hr/day medical supervision in concert with the patient's rehab needs make it unreasonable for this patient to be served in a less intensive setting? Potentially 2. Co-Morbidities requiring supervision/potential complications: multiple myeloma with prior thoracic cord compression 3. Due to bladder management, bowel management, safety, skin/wound care, disease management, medication administration, pain management and patient education, does the patient require 24 hr/day rehab nursing? Yes 4. Does the patient require coordinated care of a physician, rehab nurse, PT (1-2 hrs/day, 5 days/week), OT (1-2 hrs/day, 5 days/week) and SLP (1-2 hrs/day, 5 days/week) to address physical and functional deficits in the context of the above medical diagnosis(es)? Yes Addressing deficits in the following areas: balance, endurance, locomotion, strength, transferring, bowel/bladder control, bathing, dressing, feeding, grooming, toileting, cognition, speech, language, swallowing and psychosocial support 5. Can the patient actively participate in an intensive therapy program of at least 3 hrs of therapy per day at least 5 days per week? Yes 6. The potential  for patient to make measurable gains while on inpatient rehab is excellent 7. Anticipated functional outcomes upon discharge from inpatient rehab are modified independent with PT, modified independent and supervision with OT, modified independent and supervision with SLP. 8. Estimated rehab length of stay to reach the above functional goals is: 7-9 days 9. Does the patient have adequate social supports and living environment to accommodate these discharge functional goals? Yes 10. Anticipated D/C setting: Home 11. Anticipated post D/C treatments: HH therapy and Outpatient therapy 12. Overall Rehab/Functional Prognosis: excellent  RECOMMENDATIONS: This patient's condition is appropriate for continued rehabilitative care in the following setting: CIR Patient has agreed to participate in recommended program. Yes Note that insurance prior authorization may be required for reimbursement for recommended care.  Comment: Pt well known to me. Very motivated. Just saw him last week in the office and he was doing extremely well!!!----this so unfortunate. Rehab Admissions Coordinator to follow up.  Thanks,  Meredith Staggers, MD, [facility]     02/12/2015       Revision History     Date/Time User Provider Type Action   02/12/2015 1:57 PM Meredith Staggers, MD Physician Sign   02/12/2015 10:49 AM Cathlyn Parsons, PA-C Physician Assistant Pend   View Details Report       Routing History     Date/Time From To Method   02/12/2015 1:57 PM Meredith Staggers, MD Meredith Staggers, MD In Basket   02/12/2015 1:57 PM Meredith Staggers, MD Wenda Low, MD Fax

## 2015-02-12 NOTE — Progress Notes (Signed)
  Echocardiogram 2D Echocardiogram has been performed.  Jeremiah Owens 02/12/2015, 12:13 PM

## 2015-02-12 NOTE — Progress Notes (Addendum)
Speech Language Pathology Treatment: Dysphagia  Patient Details Name: Jeremiah Owens MRN: [id number] DOB: 1929/11/29 Today's Date: 02/12/2015 Time: 1202-1220 SLP Time Calculation (min) (ACUTE ONLY): 18 min  Assessment / Plan / Recommendation Clinical Impression  Treatment focused on utilization of compensatory strategies to facilitate safe po intake. Skilled observation complete of self feeding during pm meal. Patient able to independently demonstrate use of right head turn when questioned prior to initiation of po intake, then required min assist for consistency and accuracy of use of strategy. Education complete with both patient and wife regarding rationale for use of strategy to decrease risk of aspiration as well as additional instructions for correct usage. Both verbalized understanding. Overall, patient appears to be tolerating diet. Recommend continued close f/u.   Cognitive-linguistic evaluation held in order for patient to complete lunch. Will f/u this pm or 7/12 for eval.    HPI Other Pertinent Information: Jeremiah Owens is a 81 y.o. male with a history of multiple myeloma treated with Revlamid who presents today with 2 days of right leg weakness and difficulty speaking. He states that this started quite abruptly. Prior to this, he did not have any problems with his speech either. He states that he has difficulty with finding his words, and indeed it is difficult getting history from him because of some word finding difficulty. MRI shows Acute 18 x 9 mm LEFT basal ganglia lacunar infarct   Pertinent Vitals Pain Assessment: No/denies pain  SLP Plan  Continue with current plan of care    Recommendations Diet recommendations: Dysphagia 3 (mechanical soft);Nectar-thick liquid Liquids provided via: Cup;Straw (may use straw to facilitate head turn to the right) Medication Administration: Whole meds with puree Supervision: Patient able to self feed;Full supervision/cueing for compensatory  strategies Compensations: Other (Comment);Small sips/bites;Slow rate (head turn to the right, straw to facilitate head turn) Postural Changes and/or Swallow Maneuvers: Head turn right during swallow              General recommendations: Rehab consult Oral Care Recommendations: Oral care BID Follow up Recommendations: Inpatient Rehab Plan: Continue with current plan of care    [place], [place] [phone]   Jeremiah Owens 02/12/2015, 12:27 PM

## 2015-02-12 NOTE — Progress Notes (Signed)
Rehab Admissions Coordinator Note:  Patient was screened by Cleatrice Burke for appropriateness for an Inpatient Acute Rehab Consult per PT recommendation.   At this time, we are recommending Inpatient Rehab consult. I will contact Dr. Hartford Poli for order.  Cleatrice Burke 02/12/2015, 10:17 AM  I can be reached at [phone].

## 2015-02-12 NOTE — Evaluation (Signed)
Physical Therapy Evaluation Patient Details Name: Jeremiah Owens MRN: [id number] DOB: [birth year] Today's Date: 02/12/2015   History of Present Illness  Bijan Ridgley is a 81 y.o. male with Past medical history of multiple myeloma on chemotherapy, radiation therapy for spinal metastasis, hypothyroidism, gait imbalance. MRI revealed L basal ganglia lacunar infarct.  Clinical Impression  Pt with noted weakness and balance impairment. Pt with 24/7 supervision however can only assist at minA level. Recommend CIR for maximal rehab potential to decrease burden of care on spouse and achieve safe supervision level of function.    Follow Up Recommendations CIR    Equipment Recommendations  None recommended by PT    Recommendations for Other Services Rehab consult     Precautions / Restrictions Precautions Precautions: Fall Restrictions Weight Bearing Restrictions: No      Mobility  Bed Mobility Overal bed mobility: Needs Assistance Bed Mobility: Supine to Sit     Supine to sit: Mod assist     General bed mobility comments: assist to bring hips to EOB, pt able to push self up in sitting using handrails but unable to scoot to EOB so feet were on the floor  Transfers Overall transfer level: Needs assistance Equipment used: Rolling walker (2 wheeled) Transfers: Sit to/from Stand Sit to Stand: Min assist         General transfer comment: v/c's for safe hand placement, increased time, pt urinary incontinent up standing and unaware  Ambulation/Gait Ambulation/Gait assistance: Min assist (+1 for chair follow) Ambulation Distance (Feet): 75 Feet Assistive device: Rolling walker (2 wheeled) Gait Pattern/deviations: Decreased stride length;Step-through pattern;Decreased weight shift to right;Narrow base of support   Gait velocity interpretation: Below normal speed for age/gender General Gait Details: max v/c's to increase base of support as pt was amb with narrow base of support/near  crossover. increased dependence on UEs  Stairs            Wheelchair Mobility    Modified Rankin (Stroke Patients Only) Modified Rankin (Stroke Patients Only) Pre-Morbid Rankin Score: Moderately severe disability Modified Rankin: Moderately severe disability     Balance Overall balance assessment: Needs assistance Sitting-balance support: Single extremity supported Sitting balance-Leahy Scale: Fair     Standing balance support: Bilateral upper extremity supported Standing balance-Leahy Scale: Poor Standing balance comment: pt stood x 2 min for hygiene s/p BM. pt aware but did not call                             Pertinent Vitals/Pain Pain Assessment: No/denies pain    Home Living Family/patient expects to be discharged to:: Private residence Living Arrangements: Spouse/significant other Available Help at Discharge: Family;Available 24 hours/day Type of Home: House Home Access: Ramped entrance     Home Layout: One level Home Equipment: Walker - 2 wheels;Walker - 4 wheels;Cane - single point;Bedside commode;Shower seat;Wheelchair - manual;Grab bars - toilet;Grab bars - tub/shower      Prior Function Level of Independence: Needs assistance   Gait / Transfers Assistance Needed: used a RW, able to amb short community distances  ADL's / [employer] Assistance Needed: minimal assist for dressing/bathing occasionally        Hand Dominance   Dominant Hand: Right    Extremity/Trunk Assessment   Upper Extremity Assessment: Defer to OT evaluation           Lower Extremity Assessment: Generalized weakness (R side weaker than the L)      Cervical / Trunk Assessment:  Kyphotic  Communication   Communication: HOH;Expressive difficulties (bilat hearing aids,word finding difficulties)  Cognition Arousal/Alertness: Awake/alert Behavior During Therapy: WFL for tasks assessed/performed Overall Cognitive Status: Within Functional Limits for tasks  assessed                      General Comments      Exercises        Assessment/Plan    PT Assessment Patient needs continued PT services  PT Diagnosis Difficulty walking;Generalized weakness   PT Problem List Decreased strength;Decreased activity tolerance;Decreased balance;Decreased mobility;Decreased coordination;Decreased knowledge of use of DME  PT Treatment Interventions DME instruction;Gait training;Stair training;Functional mobility training;Therapeutic activities;Therapeutic exercise;Balance training   PT Goals (Current goals can be found in the Care Plan section) Acute Rehab PT Goals Patient Stated Goal: rehab to home PT Goal Formulation: With patient Time For Goal Achievement: 02/19/15 Potential to Achieve Goals: Good    Frequency Min 4X/week   Barriers to discharge        Co-evaluation               End of Session Equipment Utilized During Treatment: Gait belt Activity Tolerance: Patient tolerated treatment well Patient left: in chair;with call bell/phone within reach Nurse Communication: Mobility status         Time: 0820-0851 PT Time Calculation (min) (ACUTE ONLY): 31 min   Charges:   PT Evaluation $Initial PT Evaluation Tier I: 1 Procedure PT Treatments $Gait Training: 8-22 mins   PT G CodesKingsley Callander 02/12/2015, 10:01 AM  Kittie Plater, PT, DPT Pager #: [phone] Office #: [phone]

## 2015-02-12 NOTE — Discharge Summary (Signed)
Physician Discharge Summary  Jeremiah Owens SWF:[id number] DOB: 05-11-1930 DOA: 02/10/2015  PCP: Wenda Low, MD  Admit date: 02/10/2015 Discharge date: 02/12/2015  Time spent: 40 minutes  Recommendations for Outpatient Follow-up:  1. Discharged to [facility] Inpatient rehabilitation. 2. Placed on full dose of aspirin for secondary stroke prevention. 3. Continue Augmentin for 5 more days.  Discharge Diagnoses:  Principal Problem:   CVA (cerebral infarction) Active Problems:   Spine metastasis   Multiple myeloma   Gait disorder   Peripheral edema   Aspiration pneumonia   Aphasia   Difficulty speaking   Speech difficult to understand   Stroke   Dizziness   Expressive aphasia   Past pointing   Right leg weakness   Discharge Condition: Stable  Diet recommendation: Dysphagia 3 with nectar thick liquids.  Filed Weights   02/11/15 0305 02/11/15 0413  Weight: 72.576 kg (160 lb) 73.3 kg (161 lb 9.6 oz)    History of present illness:  Jeremiah Owens is a 81 y.o. male with Past medical history of multiple myeloma on chemotherapy, radiation therapy for spinal metastasis, hypothyroidism, gait imbalance. The patient is presenting with complaints of right-sided weakness ongoing since last 2 days progressively worsening. Patient also has developed expressive aphasia with difficulty speaking the right words. There is complaining from wife that today early in the morning while drinking coffee the patient had aspiration and also during the lunch he may have an aspiration. No fall no trauma no injury reported. No fever no chills no cough no chest pain and abdominal pain no diarrhea no constipation. No burning urination. At the time of my evaluation the patient complains of weakness of the right leg. No other recent change in his medications. Patient takes warfarin but it is a fixed dose of warfarin without any monitoring for INR as he is on Revlimid.  the patient is coming from home.  And is  independent for most of his ADL manages her medication on his own.   Hospital Course:   Acute CVA Patient presented to the hospital complaining about slurred speech and left-sided weakness. MRI of the brain showed acute infarct. Seen by neurology. 2-D echo no acute findings and carotid duplex without significant stenosis. Patient seen by CIR recommended admission to inpatient rehabilitation. Neurology recommended aspirin for secondary stroke prevention.  Possible aspiration pneumonia CXR showed evidence of right upper lobe infiltrates consistent with pneumonia. Patient started on Unasyn on admission, afebrile no other complaints. On discharge Ceftin for 5 more days.  Dysphagia Patient seen by SLP, MBSS was done and recommended dysphagia 3 with nectar thick liquids. Continue to work with SLP in the inpatient rehabilitation.  Multiple myeloma with spinal metastasis Patient is on chemotherapy, patient is on acyclovir, Norco. Presumed Revlimid based on oncology recommendation.  Hypothyroidism Synthroid continued.  BPH Symptoms.   Procedures:  2-D echo Impressions: - Normal LV function; grade 1 diastolic dysfunction; mild LAE; calcified aortic valve with very mild AS (peak velocity 2.1 m/s); trace AI and MR.  Consultations:  Neurology  Rehabilitation M.D.  Discharge Exam: Filed Vitals:   02/12/15 1410  BP: 130/54  Pulse: 73  Temp: 98.1 F (36.7 C)  Resp: 16   General: Alert and awake, oriented x3, not in any acute distress. HEENT: anicteric sclera, pupils reactive to light and accommodation, EOMI CVS: S1-S2 clear, no murmur rubs or gallops Chest: clear to auscultation bilaterally, no wheezing, rales or rhonchi Abdomen: soft nontender, nondistended, normal bowel sounds, no organomegaly Extremities: no cyanosis, clubbing or edema noted  bilaterally Neuro: Cranial nerves II-XII intact, no focal neurological deficits  Discharge Instructions   Discharge  Instructions    Diet - low sodium heart healthy    Complete by:  As directed      Increase activity slowly    Complete by:  As directed           Current Discharge Medication List    START taking these medications   Details  amoxicillin-clavulanate (AUGMENTIN) 875-125 MG per tablet Take 1 tablet by mouth 2 (two) times daily.    aspirin EC 325 MG tablet Take 1 tablet (325 mg total) by mouth daily.      CONTINUE these medications which have NOT CHANGED   Details  acyclovir (ZOVIRAX) 400 MG tablet TAKE 1 TABLET BY MOUTH TWICE A DAY Qty: 60 tablet, Refills: 2   Associated Diagnoses: Multiple myeloma    Calcium Carbonate-Vitamin D (CALTRATE 600+D PO) Take 600 mg by mouth 2 (two) times daily.   Associated Diagnoses: Multiple myeloma    cholecalciferol (VITAMIN D) 1000 UNITS tablet Take 1,000 Units by mouth at bedtime.    Associated Diagnoses: Metastatic bone cancer    furosemide (LASIX) 20 MG tablet Take 1 tablet (20 mg total) by mouth daily. As needed for swelling Qty: 10 tablet, Refills: 0   Associated Diagnoses: Multiple myeloma    HYDROcodone-acetaminophen (NORCO) 5-325 MG per tablet Take 1 tablet by mouth every 6 (six) hours as needed for moderate pain. Qty: 60 tablet, Refills: 0    HYDROCORTISONE, TOPICAL, 2.5 % SOLN Apply 1 application topically 3 (three) times daily as needed (for itching).    Associated Diagnoses: Metastatic bone cancer    lenalidomide (REVLIMID) 25 MG capsule Take 1 capsule (25 mg total) by mouth daily. Take one capsule ( 25 mg) by mouth for 21 days every 4 weeks-01/31/15 Authorization number=[id number]  adult male Qty: 21 capsule, Refills: 0   Associated Diagnoses: Multiple myeloma    levothyroxine (SYNTHROID, LEVOTHROID) 25 MCG tablet Take 1 tablet (25 mcg total) by mouth daily. Qty: 30 tablet, Refills: 4   Associated Diagnoses: Metastatic bone cancer    methocarbamol (ROBAXIN) 500 MG tablet TAKE 1 TABLET (500 MG TOTAL) BY MOUTH EVERY 6 (SIX) HOURS AS  NEEDED FOR MUSCLE SPASMS. Qty: 60 tablet, Refills: 0    Multiple Vitamins-Minerals (CENTRUM SILVER PO) Take 1 tablet by mouth daily.   Associated Diagnoses: Metastatic bone cancer    omeprazole (PRILOSEC) 40 MG capsule Take 40 mg by mouth daily.    Polyvinyl Alcohol-Povidone (REFRESH OP) Apply 1 drop to eye 4 (four) times daily as needed (dry/irritated eyes).     potassium chloride (K-DUR,KLOR-CON) 10 MEQ tablet Take 10 mEq by mouth daily.    prochlorperazine (COMPAZINE) 10 MG tablet Take 1 tablet (10 mg total) by mouth every 6 (six) hours as needed for nausea or vomiting. Qty: 30 tablet, Refills: 0    sennosides-docusate sodium (SENOKOT-S) 8.6-50 MG tablet Take 1-2 tablets by mouth 2 (two) times daily as needed for constipation. Takes 1-2 tabs daily    tamsulosin (FLOMAX) 0.4 MG CAPS capsule Take 1 capsule (0.4 mg total) by mouth at bedtime. Qty: 30 capsule, Refills: 3   Associated Diagnoses: Metastatic bone cancer    warfarin (COUMADIN) 2 MG tablet Take 1 tablet (2 mg total) by mouth daily. Qty: 30 tablet, Refills: 3    dexamethasone (DECADRON) 4 MG tablet 10 TAB EVERY WEEK, START WITH CHEMO Qty: 40 tablet, Refills: 4    ranitidine (ZANTAC) 150 MG  tablet Take 150 mg by mouth daily as needed for heartburn.  Refills: 11   Associated Diagnoses: Metastatic bone cancer       Allergies  Allergen Reactions  . Iodine Itching    Patient had a reaction 20 years ago and can't remember what kind of reaction he had.  Patient was given decadron in the ER and had an Angio ordered and did test with no problems.  Bottom line "patient needs to pre-medicated before any IV dye is given".   Follow-up Information    Schedule an appointment as soon as possible for a visit with Meredith Staggers, MD.   Specialty:  Physical Medicine and Rehabilitation   Contact information:   [address], [place] [address] [phone]        The results of significant diagnostics from this  hospitalization (including imaging, microbiology, ancillary and laboratory) are listed below for reference.    Significant Diagnostic Studies: Dg Chest 2 View  02/11/2015   CLINICAL DATA:  Weakness  EXAM: CHEST  2 VIEW  COMPARISON:  06/17/2014  FINDINGS: Subpleural opacity in the right upper lobe is asymmetric and could reflect consolidation/pneumonia. This was not seen on scout from 10/17/2014 thoracic spine CT, arguing against neoplastic process. Chronic cardiomegaly. Negative aortic and hilar contours. Ventilation is low.  Multiple myeloma with upper thoracic posterior fixation.  IMPRESSION: 1. Subtle opacity in the right upper lobe, potentially pneumonia. Recommend 3-4 week follow-up after treatment. 2. Hypoventilation.   Electronically Signed   By: Monte Fantasia M.D.   On: 02/11/2015 01:36   Ct Head Wo Contrast  02/11/2015   CLINICAL DATA:  Difficulty with speech with dizziness. Chronic right leg weakness.  EXAM: CT HEAD WITHOUT CONTRAST  TECHNIQUE: Contiguous axial images were obtained from the base of the skull through the vertex without intravenous contrast.  COMPARISON:  None.  FINDINGS: Skull and Sinuses:Negative for fracture or destructive process. The mastoids, middle ears, and imaged paranasal sinuses are clear.  Orbits: No acute abnormality.  Brain: No evidence of acute gray matter infarction, hemorrhage, hydrocephalus, or mass lesion/mass effect. There is generalized brain atrophy with ventriculomegaly, mild. Chronic small-vessel disease with patchy ischemic gliosis throughout the bilateral cerebral white matter. There is ischemic change in the right putamen, likely chronic based on history.  IMPRESSION: 1. No intracranial hemorrhage or definite infarct. 2. Moderate chronic small vessel disease, which could obscure a recent white matter infarct.   Electronically Signed   By: Monte Fantasia M.D.   On: 02/11/2015 00:20   Mr Brain Wo Contrast  02/11/2015   CLINICAL DATA:  Acute on chronic  RIGHT leg weakness, new onset garbled speech. History of melanoma, prostate cancer, stroke.  EXAM: MRI HEAD WITHOUT CONTRAST  MRA HEAD WITHOUT CONTRAST  TECHNIQUE: Multiplanar, multiecho pulse sequences of the brain and surrounding structures were obtained without intravenous contrast. Angiographic images of the head were obtained using MRA technique without contrast.  COMPARISON:  CT of the head February 10, 2015  FINDINGS: MRI HEAD FINDINGS  18 x 9 mm area of reduced diffusion in LEFT basal ganglia (lenticular striate nucleus), corresponding low ADC value consistent with acute ischemia. Corresponding mild FLAIR hyperintense signal. No susceptibility artifact to suggest hemorrhage.  Ventricles and sulci are normal for patient's age. Patchy to confluent supratentorial, pontine white matter T2 hyperintensities without midline shift, mass effect, mass lesions. Innumerable tiny T2 hyperintensities within the basal ganglia compatible with perivascular spaces. In addition, small old RIGHT caudate body lacunar infarct.  No abnormal extra-axial fluid collections. Normal major intracranial vascular flow voids seen at the skull base.  Ocular globes and orbital contents are unremarkable. Mild paranasal sinus mucosal thickening without air-fluid levels. Trace LEFT mastoid effusion.  MRA HEAD FINDINGS  Anterior circulation: Normal flow related enhancement of the included cervical, petrous, cavernous and supra clinoid internal carotid arteries. Patent anterior communicating artery. Normal flow related enhancement of the anterior and middle cerebral arteries, including more distal segments.  No large vessel occlusion, high-grade stenosis, aneurysm. Mild middle cerebral artery luminal irregularity.  Posterior circulation: Codominant vertebral artery's. Basilar artery is patent, with normal flow related enhancement of the main branch vessels. Normal flow related enhancement of the posterior cerebral arteries. Small LEFT posterior  communicating artery is present.  No large vessel occlusion, high-grade stenosis, mild luminal irregularity of the posterior cerebral arteries. No aneurysm.  IMPRESSION: MRI HEAD: Acute 18 x 9 mm LEFT basal ganglia lacunar infarct.  Involutional changes. Moderate white matter changes compatible chronic small vessel ischemic disease. Old RIGHT caudate lacunar infarct.  MRA HEAD: No acute large vessel occlusion or high-grade stenosis.  Mild luminal irregularity intracranial vessels compatible with atherosclerosis.   Electronically Signed   By: Elon Alas M.D.   On: 02/11/2015 06:23   Dg Swallowing Func-speech Pathology  02/11/2015    Objective Swallowing Evaluation:    Patient Details  Name: Jeremiah Owens MRN: [id number] Date of Birth: 21-Feb-1930  Today's Date: 02/11/2015 Time: SLP Start Time (ACUTE ONLY): 1340-SLP Stop Time (ACUTE ONLY): 1400 SLP Time Calculation (min) (ACUTE ONLY): 20 min  Past Medical History:  Past Medical History  Diagnosis Date  . Compression fracture   . Allergy   . Anxiety   . Thyroid disease   . S/P radiation therapy 08/21/14-09/04/14    T4-6 25Gy/90f  . S/P radiation therapy 11/29/14-12/12/14    rt prox humerus/shoulder/scapula 25Gy/138f . Encounter for antineoplastic chemotherapy 01/31/2015  . Melanoma   . Bone cancer     t_spine T-5  . Prostate cancer 2002  . Skin cancer 1994    melanoma  right neck   . Multiple myeloma    Past Surgical History:  Past Surgical History  Procedure Laterality Date  . Hernia repair    . Posterior cervical fusion/foraminotomy N/A 06/17/2014    Procedure: Thoracic five laminectomy for epidural tumor resection  Thoracic 4-7 posterior lateral arthrodesis, segmental pedicle screw  fixation.;  Surgeon: KyAshok PallMD;  [facility];  Service:  Neurosurgery;  Laterality: N/A;   HPI:  Other Pertinent Information: RoRamona Slingers a [age].o. male with a  history of multiple myeloma treated with Revlamid who presents today with  2 days of right leg  weakness and difficulty speaking. He states that this  started quite abruptly. Prior to this, he did not have any problems with  his speech either. He states that he has difficulty with finding his  words, and indeed it is difficult getting history from him because of some  word finding difficulty. MRI shows Acute 18 x 9 mm LEFT basal ganglia  lacunar infarct  No Data Recorded  Assessment / Plan / Recommendation CHL IP CLINICAL IMPRESSIONS 02/11/2015  Therapy Diagnosis Moderate pharyngeal phase dysphagia  Clinical Impression Pt presents with a neuromuscular dysphagia with motor  and sensory deficits due to impairment. There is delayed initiation of the  swallow, particularly dangerous with thin liquids. With all textures there  is weakness of the right lateral wall and possible decreased adduction of  the right arytenoid resulting in silent penetration during the swallow and  residuals in the right lateral channel which are silently aspriated during  and after the swallow. With a head turn right laryngeal closure duirng the  swallow improves and residuals are eliminated. Nectar thick liquids are  still warranted due to delayed timing of swallow. Recommend pt consume a  dys 3 (mechanical soft) diet with nectar thick liquids. Initially pt will  need full supervision to ensure head turn right. As pt demonstrates  independence, supervision can fade. SLP will follow for tolerance.       CHL IP TREATMENT RECOMMENDATION 02/11/2015  Treatment Recommendations Therapy as outlined in treatment plan below     CHL IP DIET RECOMMENDATION 02/11/2015  SLP Diet Recommendations (None)  Liquid Administration via (None)  Medication Administration Whole meds with puree  Compensations Other (Comment);Small sips/bites;Slow rate  Postural Changes and/or Swallow Maneuvers (None)     CHL IP OTHER RECOMMENDATIONS 02/11/2015  Recommended Consults (None)  Oral Care Recommendations Oral care BID  Other Recommendations Order thickener from pharmacy      No flowsheet data found.   CHL IP FREQUENCY AND DURATION 02/11/2015  Speech Therapy Frequency (ACUTE ONLY) min 2x/week  Treatment Duration 2 weeks     Pertinent Vitals/Pain NA    SLP Swallow Goals No flowsheet data found.  No flowsheet data found.    CHL IP REASON FOR REFERRAL 02/11/2015  Reason for Referral Objectively evaluate swallowing function               DeBlois, Katherene Ponto 02/11/2015, 2:16 PM    Mr Jodene Nam Head/brain Wo Cm  02/11/2015   CLINICAL DATA:  Acute on chronic RIGHT leg weakness, new onset garbled speech. History of melanoma, prostate cancer, stroke.  EXAM: MRI HEAD WITHOUT CONTRAST  MRA HEAD WITHOUT CONTRAST  TECHNIQUE: Multiplanar, multiecho pulse sequences of the brain and surrounding structures were obtained without intravenous contrast. Angiographic images of the head were obtained using MRA technique without contrast.  COMPARISON:  CT of the head February 10, 2015  FINDINGS: MRI HEAD FINDINGS  18 x 9 mm area of reduced diffusion in LEFT basal ganglia (lenticular striate nucleus), corresponding low ADC value consistent with acute ischemia. Corresponding mild FLAIR hyperintense signal. No susceptibility artifact to suggest hemorrhage.  Ventricles and sulci are normal for patient's age. Patchy to confluent supratentorial, pontine white matter T2 hyperintensities without midline shift, mass effect, mass lesions. Innumerable tiny T2 hyperintensities within the basal ganglia compatible with perivascular spaces. In addition, small old RIGHT caudate body lacunar infarct. No abnormal extra-axial fluid collections. Normal major intracranial vascular flow voids seen at the skull base.  Ocular globes and orbital contents are unremarkable. Mild paranasal sinus mucosal thickening without air-fluid levels. Trace LEFT mastoid effusion.  MRA HEAD FINDINGS  Anterior circulation: Normal flow related enhancement of the included cervical, petrous, cavernous and supra clinoid internal carotid arteries. Patent  anterior communicating artery. Normal flow related enhancement of the anterior and middle cerebral arteries, including more distal segments.  No large vessel occlusion, high-grade stenosis, aneurysm. Mild middle cerebral artery luminal irregularity.  Posterior circulation: Codominant vertebral artery's. Basilar artery is patent, with normal flow related enhancement of the main branch vessels. Normal flow related enhancement of the posterior cerebral arteries. Small LEFT posterior communicating artery is present.  No large vessel occlusion, high-grade stenosis, mild luminal irregularity of the posterior cerebral arteries. No aneurysm.  IMPRESSION: MRI HEAD: Acute 18 x 9 mm LEFT basal ganglia lacunar infarct.  Involutional changes. Moderate white matter changes compatible chronic small vessel ischemic disease. Old RIGHT caudate lacunar infarct.  MRA HEAD: No acute large vessel occlusion or high-grade stenosis.  Mild luminal irregularity intracranial vessels compatible with atherosclerosis.   Electronically Signed   By: Elon Alas M.D.   On: 02/11/2015 06:23    Microbiology: No results found for this or any previous visit (from the past 240 hour(s)).   Labs: Basic Metabolic Panel:  Recent Labs Lab 02/07/15 1009 02/10/15 2209 02/12/15 1050  NA 140 138 138  K 4.1 3.8 3.5  CL  --  103 102  CO2 _0 GLUCOSE 91 111* 135*  BUN 15.2 25* 10  CREATININE 0.9 1.21 0.89  CALCIUM 9.2 9.0 8.4*   Liver Function Tests:  Recent Labs Lab 02/07/15 1009 02/10/15 2209  AST 20 21  ALT 21 19  ALKPHOS 63 56  BILITOT 0.62 0.7  PROT 5.7* 5.9*  ALBUMIN 3.5 3.5   No results for input(s): LIPASE, AMYLASE in the last 168 hours. No results for input(s): AMMONIA in the last 168 hours. CBC:  Recent Labs Lab 02/07/15 1009 02/10/15 2209 02/12/15 1050  WBC 3.1* 2.6* 2.6*  NEUTROABS 2.6 1.6*  --   HGB 12.3* 11.8* 13.5  HCT 36.3* 35.3* 39.7  MCV 95.2 94.9 92.8  PLT 135* 124* 110*   Cardiac  Enzymes:  Recent Labs Lab 02/10/15 2209  CKTOTAL 60   BNP: BNP (last 3 results) No results for input(s): BNP in the last 8760 hours.  ProBNP (last 3 results) No results for input(s): PROBNP in the last 8760 hours.  CBG:  Recent Labs Lab 02/10/15 2202  GLUCAP 112*       Signed:  Joshuwa Vecchio A  [facility] 02/12/2015, 2:29 PM

## 2015-02-13 ENCOUNTER — Inpatient Hospital Stay (HOSPITAL_COMMUNITY): Payer: Medicare Other | Admitting: Physical Therapy

## 2015-02-13 ENCOUNTER — Inpatient Hospital Stay (HOSPITAL_COMMUNITY): Payer: Medicare Other | Admitting: Occupational Therapy

## 2015-02-13 ENCOUNTER — Inpatient Hospital Stay (HOSPITAL_COMMUNITY): Payer: Medicare Other | Admitting: Speech Pathology

## 2015-02-13 DIAGNOSIS — R197 Diarrhea, unspecified: Secondary | ICD-10-CM

## 2015-02-13 LAB — COMPREHENSIVE METABOLIC PANEL WITH GFR
ALT: 17 U/L (ref 17–63)
AST: 22 U/L (ref 15–41)
Albumin: 2.9 g/dL — ABNORMAL LOW (ref 3.5–5.0)
Alkaline Phosphatase: 52 U/L (ref 38–126)
Anion gap: 4 — ABNORMAL LOW (ref 5–15)
BUN: 11 mg/dL (ref 6–20)
CO2: 27 mmol/L (ref 22–32)
Calcium: 8 mg/dL — ABNORMAL LOW (ref 8.9–10.3)
Chloride: 104 mmol/L (ref 101–111)
Creatinine, Ser: 0.89 mg/dL (ref 0.61–1.24)
GFR calc Af Amer: >60 mL/min
GFR calc non Af Amer: >60 mL/min
Glucose, Bld: 93 mg/dL (ref 65–99)
Potassium: 3.6 mmol/L (ref 3.5–5.1)
Sodium: 135 mmol/L (ref 135–145)
Total Bilirubin: 0.5 mg/dL (ref 0.3–1.2)
Total Protein: 4.8 g/dL — ABNORMAL LOW (ref 6.5–8.1)

## 2015-02-13 LAB — CLOSTRIDIUM DIFFICILE BY PCR: Toxigenic C. Difficile by PCR: NEGATIVE

## 2015-02-13 LAB — CBC WITH DIFFERENTIAL/PLATELET
Basophils Absolute: 0 K/uL (ref 0.0–0.1)
Basophils Relative: 1 % (ref 0–1)
Eosinophils Absolute: 0.2 K/uL (ref 0.0–0.7)
Eosinophils Relative: 8 % — ABNORMAL HIGH (ref 0–5)
HCT: 35.7 % — ABNORMAL LOW (ref 39.0–52.0)
Hemoglobin: 12.2 g/dL — ABNORMAL LOW (ref 13.0–17.0)
Lymphocytes Relative: 16 % (ref 12–46)
Lymphs Abs: 0.4 K/uL — ABNORMAL LOW (ref 0.7–4.0)
MCH: 31.4 pg (ref 26.0–34.0)
MCHC: 34.2 g/dL (ref 30.0–36.0)
MCV: 92 fL (ref 78.0–100.0)
Monocytes Absolute: 0.3 K/uL (ref 0.1–1.0)
Monocytes Relative: 9 % (ref 3–12)
Neutro Abs: 1.8 K/uL (ref 1.7–7.7)
Neutrophils Relative %: 66 % (ref 43–77)
Platelets: 98 K/uL — ABNORMAL LOW (ref 150–400)
RBC: 3.88 MIL/uL — ABNORMAL LOW (ref 4.22–5.81)
RDW: 16.1 % — ABNORMAL HIGH (ref 11.5–15.5)
WBC: 2.7 K/uL — ABNORMAL LOW (ref 4.0–10.5)

## 2015-02-13 MED ORDER — SACCHAROMYCES BOULARDII 250 MG PO CAPS
250.0000 mg | ORAL_CAPSULE | Freq: Two times a day (BID) | ORAL | Status: DC
  Administered 2015-02-13 – 2015-02-21 (×17): 250 mg via ORAL
  Filled 2015-02-13 (×20): qty 1

## 2015-02-13 NOTE — Evaluation (Signed)
Speech Language Pathology Assessment and Plan  Patient Details  Name: Jeremiah Owens MRN: [id number] Date of Birth: [birth year]  SLP Diagnosis: Aphasia;Dysphagia  Rehab Potential: Good ELOS: 7-10 days    Today's Date: 02/13/2015 SLP Individual Time: 1305-1405 SLP Individual Time Calculation (min): 60 min   Problem List:  Patient Active Problem List   Diagnosis Date Noted  . Basal ganglia infarction 02/12/2015  . Dizziness   . Expressive aphasia   . Past pointing   . Right leg weakness   . Acute ischemic stroke 02/11/2015  . CVA (cerebral infarction) 02/11/2015  . Aspiration pneumonia 02/11/2015  . Aphasia   . Difficulty speaking   . Speech difficult to understand   . Stroke   . Encounter for antineoplastic chemotherapy 01/31/2015  . Constipation 12/20/2014  . Rash 12/20/2014  . Cellulitis 12/06/2014  . Peripheral edema 12/06/2014  . Gait disorder 09/05/2014  . Multiple myeloma   . Paraplegia 06/21/2014  . Postoperative anemia due to acute blood loss 06/21/2014  . 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 Spine metastasis 06/17/2014  . Thoracic spine tumor 06/17/2014  . Metastatic bone cancer 06/15/2014   Past Medical History:  Past Medical History  Diagnosis Date  . Compression fracture   . Allergy   . Anxiety   . Thyroid disease   . S/P radiation therapy 08/21/14-09/04/14    T4-6 25Gy/71f  . S/P radiation therapy 11/29/14-12/12/14    rt prox humerus/shoulder/scapula 25Gy/152f . Encounter for antineoplastic chemotherapy 01/31/2015  . Melanoma   . Bone cancer     t_spine T-5  . Prostate cancer 2002  . Skin cancer 1994    melanoma  right neck   . Multiple myeloma    Past Surgical History:  Past Surgical History  Procedure Laterality Date  . Hernia repair    . Posterior cervical fusion/foraminotomy N/A 06/17/2014    Procedure: Thoracic five laminectomy for epidural tumor resection Thoracic 4-7 posterior lateral arthrodesis, segmental pedicle screw fixation.;   Surgeon: Jeremiah PallMD;  [facility];  Service: Neurosurgery;  Laterality: N/A;    Assessment / Plan / Recommendation Clinical Impression [name] an [age].o. right handed male with history of multiple myeloma on chemotherapy and radiation therapy for spinal metastasis 08/21/2014-09/04/2014 as well as 11/29/2014-12/12/2014. Known to rehabilitation services from admission November 2015 for thoracic spine decompression and fusion related to multiple myeloma. Patient lives with his wife used a rolling walker to ambulate short community distances prior to admission. Presented 02/11/2015 with right sided weakness and slurred speech 2 days. MRI of the brain showed acute left basal ganglia lacunar infarction as well as old right caudate lacunar infarct. Suspect aspiration pneumonia currently maintained on Unasyn changed to Augmentin 02/12/2015 5 days. Currently on a dysphagia 3 nectar- thick liquid diet. [facility] 02/12[facility]2015 with recommendations of physical medicine rehabilitation consult. Patient was admitted for comprehensive rehabilitation program 02/12/15. Patient demonstrates bedside swallow impairments characterized by a suspected delayed swallow initiation resulting in throat clears with thin liquids and nectar-thick liquids when in head neutral positioning, which continue to be consistent with recent objective assessment.  Recommend to continue with current orders with intermittent supervision due to good recall and utilization of recommended safe swallow strategies. Patient's cognition appears grossly WFL, but will continue to monitor memory.  During expressive communication tasks patient demonstrates semantic paraphasia and anomia in basic daily conversations which impact the patient's overall ability  to communicate needs and wants; as a result, patient would benefit from skilled SLP  intervention in order to maximize their functional independence prior to discharge. Anticipate patient will require 24 hour supervision at home and follow up SLP services.    Skilled Therapeutic Interventions          Cognitive-linguistic evaluation completed with results and recommendations reviewed with family.    SLP Assessment  Patient will need skilled Speech Lanaguage Pathology Services during CIR admission    Recommendations  SLP Diet Recommendations: Dysphagia 3 (Mech soft);Nectar Liquid Administration via: Cup;Straw Medication Administration: Whole meds with puree Supervision: Patient able to self feed;Intermittent supervision to cue for compensatory strategies Compensations: Small sips/bites;Slow rate Postural Changes and/or Swallow Maneuvers: Head turn right during swallow;Out of bed for meals Oral Care Recommendations: Oral care BID Patient destination: Home Follow up Recommendations: Home Health SLP;24 hour supervision/assistance Equipment Recommended: Other (comment) (thickener)    SLP Frequency 3 to 5 out of 7 days   SLP Treatment/Interventions Cueing hierarchy;Dysphagia/aspiration precaution training;Environmental controls;Functional tasks;Internal/external aids;Multimodal communication approach;Patient/family education;Speech/Language facilitation    Pain Pain Assessment Pain Assessment: No/denies pain Pain Score: 0-No pain Prior Functioning Cognitive/Linguistic Baseline: Information not available Type of Home: House  Lives With: Spouse Available Help at Discharge: Family;Available 24 hours/day  Short Term Goals: Week 1: SLP Short Term Goal 1 (Week 1): short term goals = long term goals due to length of stay  See FIM for current functional status Refer to Care Plan for Long Term Goals  Recommendations for other services: None  Discharge Criteria: Patient will be discharged from SLP if patient refuses treatment 3 consecutive times without medical reason, if  treatment goals not met, if there is a change in medical status, if patient makes no progress towards goals or if patient is discharged from hospital.  The above assessment, treatment plan, treatment alternatives and goals were discussed and mutually agreed upon: by patient  Gunnar Fusi, M.A., CCC-SLP [phone]  [place] 02/13/2015, 2:47 PM

## 2015-02-13 NOTE — Progress Notes (Signed)
Patient information reviewed and entered into eRehab system by Anushri Casalino, RN, CRRN, PPS Coordinator.  Information including medical coding and functional independence measure will be reviewed and updated through discharge.     Per nursing patient was given "Data Collection Information Summary for Patients in Inpatient Rehabilitation Facilities with attached "Privacy Act Statement-Health Care Records" upon admission.  

## 2015-02-13 NOTE — Progress Notes (Signed)
Inpatient [facility] Individual Statement of Services  Patient Name:  Jeremiah Owens  Date:  02/13/2015  Welcome to the [facility].  Our goal is to provide you with an individualized program based on your diagnosis and situation, designed to meet your specific needs.  With this comprehensive rehabilitation program, you will be expected to participate in at least 3 hours of rehabilitation therapies Monday-Friday, with modified therapy programming on the weekends.  Your rehabilitation program will include the following services:  Physical Therapy (PT), Occupational Therapy (OT), Speech Therapy (ST), 24 hour per day rehabilitation nursing, Neuropsychology, Case Management (Social Worker), Rehabilitation Medicine, Nutrition Services and Pharmacy Services  Weekly team conferences will be held on Tuesdays to discuss your progress.  Your Social Worker will talk with you frequently to get your input and to update you on team discussions.  Team conferences with you and your family in attendance may also be held.  Expected length of stay:  7 to 9 days    Overall anticipated outcome:  Supervision  Depending on your progress and recovery, your program may change. Your Social Worker will coordinate services and will keep you informed of any changes. Your Social Worker's name and contact numbers are listed  below.  The following services may also be recommended but are not provided by the [facility] will be made to provide these services after discharge if needed.  Arrangements include referral to agencies that provide these services.  Your insurance has been verified to be:  Medicare and Tricare Your primary doctor is:  Dr. Wenda Low  Pertinent information will be shared with your doctor and your insurance company.  Social Worker:   Alfonse Alpers, LCSW  [phone] or [phone]  Information discussed with and copy given to patient by: Trey Sailors, 02/13/2015, 11:28 AM

## 2015-02-13 NOTE — Evaluation (Signed)
Physical Therapy Assessment and Plan  Patient Details  Name: Halston Kintz MRN: [id number] Date of Birth: [birth year]  PT Diagnosis: Abnormal posture, Abnormality of gait, Cognitive deficits, Difficulty walking, Edema, Impaired cognition, Impaired sensation and Muscle weakness Rehab Potential: Good ELOS: 7-10 days   Today's Date: 02/13/2015 PT Individual Time: 1100-1200 PT Individual Time Calculation (min): 60 min    Problem List:  Patient Active Problem List   Diagnosis Date Noted  . Basal ganglia infarction 02/12/2015  . Dizziness   . Expressive aphasia   . Past pointing   . Right leg weakness   . Acute ischemic stroke 02/11/2015  . CVA (cerebral infarction) 02/11/2015  . Aspiration pneumonia 02/11/2015  . Aphasia   . Difficulty speaking   . Speech difficult to understand   . Stroke   . Encounter for antineoplastic chemotherapy 01/31/2015  . Constipation 12/20/2014  . Rash 12/20/2014  . Cellulitis 12/06/2014  . Peripheral edema 12/06/2014  . Gait disorder 09/05/2014  . Multiple myeloma   . Paraplegia 06/21/2014  . Postoperative anemia due to acute blood loss 06/21/2014  . 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 Spine metastasis 06/17/2014  . Thoracic spine tumor 06/17/2014  . Metastatic bone cancer 06/15/2014    Past Medical History:  Past Medical History  Diagnosis Date  . Compression fracture   . Allergy   . Anxiety   . Thyroid disease   . S/P radiation therapy 08/21/14-09/04/14    T4-6 25Gy/94f  . S/P radiation therapy 11/29/14-12/12/14    rt prox humerus/shoulder/scapula 25Gy/185f . Encounter for antineoplastic chemotherapy 01/31/2015  . Melanoma   . Bone cancer     t_spine T-5  . Prostate cancer 2002  . Skin cancer 1994    melanoma  right neck   . Multiple myeloma    Past Surgical History:  Past Surgical History  Procedure Laterality Date  . Hernia repair    . Posterior cervical fusion/foraminotomy N/A 06/17/2014    Procedure: Thoracic five  laminectomy for epidural tumor resection Thoracic 4-7 posterior lateral arthrodesis, segmental pedicle screw fixation.;  Surgeon: KyAshok PallMD;  [facility];  Service: Neurosurgery;  Laterality: N/A;    Assessment & Plan Clinical Impression: RoJanson Lamars a [age].o. right handed male with history of multiple myeloma on chemotherapy, radiation therapy for spinal metastasis 08/21/2014-09/04/2014 as well as 11/29/2014-12/12/2014 per Dr. MoLisbeth Renshawnd Dr. MoEarlie ServerWell known to rehabilitation services from admission November 2015 for thoracic spine decompression and fusion related to multiple myeloma. Patient lives with his wife used a rolling walker to ambulate short community distances prior to admission. Presented 02/11/2015 with right sided weakness and slurred speech 2 days. MRI of the brain showed acute left basal ganglia lacunar infarction as well as old right caudate lacunar infarct. MRA of the head with no large vessel occlusion or stenosis. Carotid Doppler but no ICA stenosis. Echocardiogram pending. Patient did not receive TPA. Neurology consulted presently on aspirin for CVA prophylaxis as well as subcutaneous heparin for DVT prophylaxis. Suspect aspiration pneumonia currently maintained on Unasyn. Dysphagia #3 nectar thick liquid diet. [facility]  Patient currently requires min with mobility secondary to muscle weakness, decreased cardiorespiratoy endurance and decreased attention, decreased awareness, decreased safety awareness and decreased memory.  Prior to hospitalization, patient was supervision with mobility and lived with Spouse in a House home.  Home access is  Ramped entrance.  Patient will benefit from skilled PT intervention to  maximize safe functional mobility, minimize fall risk and decrease caregiver burden for planned discharge home with 24 hour supervision.  Anticipate patient will benefit from follow up [place] at  discharge.  PT - End of Session Activity Tolerance: Tolerates 30+ min activity with multiple rests Endurance Deficit: Yes Endurance Deficit Description: required multiple rest breaks PT Assessment Rehab Potential (ACUTE/IP ONLY): Good PT Patient demonstrates impairments in the following area(s): Balance;Endurance;Motor;Sensory;Safety PT Transfers Functional Problem(s): Bed Mobility;Bed to Chair;Car;Furniture PT Locomotion Functional Problem(s): Ambulation;Wheelchair Mobility;Stairs PT Plan PT Intensity: Minimum of 1-2 x/day ,45 to 90 minutes PT Frequency: 5 out of 7 days PT Duration Estimated Length of Stay: 7-10 days PT Treatment/Interventions: Ambulation/gait training;Balance/vestibular training;Neuromuscular re-education;Patient/family education;Stair training;Therapeutic Exercise;UE/LE Coordination activities;UE/LE Strength taining/ROM;Wheelchair propulsion/positioning;Therapeutic Activities;Functional mobility training;Discharge planning;Community reintegration;Psychosocial support PT Transfers Anticipated Outcome(s): supervision PT Locomotion Anticipated Outcome(s): supervision with RW PT Recommendation Recommendations for Other Services: Speech consult;Neuropsych consult Follow Up Recommendations: Home health PT Patient destination: Home Equipment Recommended: Other (comment);To be determined (Pt reports having RW, w/c, cane already)  Skilled Therapeutic Intervention Pt received in recliner with no c/o pain and agreeable to treatment. Initial PT evaluation performed and completed. Pt educated on role and expectations of inpatient rehab, use of call bell system, fall prevention, and daily therapy schedule. Initiated gait training with RW, stair training with minA, w/c propulsion with supervision, and bed mobility/transfer training with minA overall. Pt requested to use the restroom at completion of session; ambulated into bathroom and RW with minA due to unsteadiness and pt performed  doffing of pants and personal hygiene. Required modA for donning pants. Pt returned to recliner and left sitting with QR belt intact, all needs within reach, and sister-in-law present at completion of session.  PT Evaluation Precautions/Restrictions Precautions Precautions: Fall Restrictions Weight Bearing Restrictions: No General Chart Reviewed: Yes Family/Caregiver Present: No Vital Signs  Pain Pain Assessment Pain Assessment: No/denies pain Pain Score: 0-No pain Home Living/Prior Functioning Home Living Living Arrangements: Spouse/significant other Available Help at Discharge: Family;Available 24 hours/day Type of Home: House Home Access: Ramped entrance Home Layout: One level Bathroom Shower/Tub: Multimedia programmer: Handicapped height Bathroom Accessibility: Yes Additional Comments: home fully handicapped accessible  Lives With: Spouse Prior Function Level of Independence: Independent with basic ADLs;Requires assistive device for independence Driving: Yes  Cognition Overall Cognitive Status: Within Functional Limits for tasks assessed Arousal/Alertness: Awake/alert Orientation Level: Oriented X4 Memory: Impaired Memory Impairment: Decreased short term memory Decreased Short Term Memory: Verbal basic Behaviors: Impulsive Safety/Judgment: Impaired Comments: impulsive, poor awareness of deficits Sensation Sensation Light Touch: Appears Intact Stereognosis: Not tested Hot/Cold: Not tested Proprioception: Impaired by gross assessment (Hallux and ankle BLE impaired; 3/5 accurate hallux, 4/5 accurate ankle) Coordination Gross Motor Movements are Fluid and Coordinated: Yes Fine Motor Movements are Fluid and Coordinated: No Finger Nose Finger Test: WFLs Heel Shin Test: [facility] 9 Hole Peg Test: Rt:1:11 and Lt: 48 seconds Motor  Motor Motor: Within Functional Limits Motor - Skilled Clinical Observations: grossly coordinated throghout BLEs, poor posture and  postural control/balance strategies  Mobility Bed Mobility Bed Mobility: Supine to Sit;Sit to Supine Supine to Sit: 5: Supervision Supine to Sit Details: Verbal cues for sequencing Sit to Supine: 5: Supervision Sit to Supine - Details: Verbal cues for sequencing Transfers Transfers: Yes Stand Pivot Transfers: 4: Min assist (with RW) Stand Pivot Transfer Details: Verbal cues for safe use of DME/AE;Verbal cues for precautions/safety Stand Pivot Transfer Details (indicate cue type and reason): minA due to balance impairments, unsteadiness Locomotion  Ambulation  Ambulation: Yes Ambulation/Gait Assistance: 4: Min assist Ambulation Distance (Feet): 278 Feet Assistive device: Rolling walker Ambulation/Gait Assistance Details: Verbal cues for precautions/safety;Verbal cues for safe use of DME/AE Ambulation/Gait Assistance Details: unsteadines, occasional mild staggering Gait Gait: Yes Gait Pattern: Impaired Gait Pattern: Shuffle;Narrow base of support;Scissoring;Decreased stride length Gait velocity: dec compared to age norms Stairs / Additional Locomotion Stairs: Yes Stairs Assistance: 4: Min Industrial/product designer Assistance Details: Verbal cues for precautions/safety;Verbal cues for sequencing Stairs Assistance Details (indicate cue type and reason): cues for hand placement/progression Stair Management Technique: Two rails;Alternating pattern Number of Stairs: 12 Height of Stairs: 6 Wheelchair Mobility Wheelchair Mobility: Yes Wheelchair Assistance: 5: Supervision (cues for attention to surroundings, obstacle navigation) Wheelchair Assistance Details: Verbal cues for Information systems manager: Both lower extermities Wheelchair Parts Management: Needs assistance Distance: 175  Trunk/Postural Assessment  Cervical Assessment Cervical Assessment: Within Functional Limits Thoracic Assessment Thoracic Assessment: Exceptions to [facility] Thoracic AROM Overall Thoracic AROM  Comments: inc thoracic kyphosis Lumbar Assessment Lumbar Assessment: Exceptions to [facility] Lumbar AROM Overall Lumbar AROM Comments: reduced lumbar lordosis, posterior pelvic tilt Postural Control Postural Control: Deficits on evaluation Protective Responses: Slow power production for stepping strategies and inadequate to maintain balance independently  Balance Balance Balance Assessed: Yes Standardized Balance Assessment Standardized Balance Assessment: Berg Balance Test Berg Balance Test Sit to Stand: Able to stand using hands after several tries Standing Unsupported: Able to stand 2 minutes with supervision Sitting with Back Unsupported but Feet Supported on Floor or Stool: Able to sit safely and securely 2 minutes Stand to Sit: Sits independently, has uncontrolled descent Transfers: Able to transfer with verbal cueing and /or supervision Standing Unsupported with Eyes Closed: Able to stand 10 seconds with supervision Standing Ubsupported with Feet Together: Able to place feet together independently but unable to hold for 30 seconds From Standing, Reach Forward with Outstretched Arm: Reaches forward but needs supervision From Standing Position, Pick up Object from Floor: Able to pick up shoe, needs supervision From Standing Position, Turn to Look Behind Over each Shoulder: Turn sideways only but maintains balance Turn 360 Degrees: Needs close supervision or verbal cueing Standing Unsupported, Alternately Place Feet on Step/Stool: Needs assistance to keep from falling or unable to try Standing Unsupported, One Foot in Front: Loses balance while stepping or standing Standing on One Leg: Unable to try or needs assist to prevent fall Total Score: 24 Dynamic Sitting Balance Dynamic Sitting - Balance Support: Feet supported;Right upper extremity supported Dynamic Sitting - Level of Assistance: 5: Stand by assistance Static Standing Balance Static Standing - Balance Support: Bilateral upper  extremity supported Static Standing - Level of Assistance: 5: Stand by assistance Dynamic Standing Balance Dynamic Standing - Balance Support: Bilateral upper extremity supported;During functional activity Dynamic Standing - Level of Assistance: 4: Min assist Extremity Assessment  RUE Assessment RUE Assessment: Exceptions to [facility] (ROM WFL, strength grossly 4/5 throughout) LUE Assessment LUE Assessment: Exceptions to [facility] (shoulder flexion limited due to arthritis in shoulder, grossly 5/5 strength througout available ROM) RLE Assessment RLE Assessment: Within Functional Limits (grossly 4+/5 throughout) LLE Assessment LLE Assessment: Within Functional Limits (grossly 4+/5 throughout)  FIM:  FIM - Locomotion: Wheelchair Distance: 175 FIM - Locomotion: Ambulation Ambulation/Gait Assistance: 4: Min assist   Refer to Care Plan for Long Term Goals  Recommendations for other services: Neuropsych and Other: Speech  Discharge Criteria: Patient will be discharged from PT if patient refuses treatment 3 consecutive times without medical reason, if treatment goals not met, if there is a  change in medical status, if patient makes no progress towards goals or if patient is discharged from hospital.  The above assessment, treatment plan, treatment alternatives and goals were discussed and mutually agreed upon: by patient  Luberta Mutter 02/13/2015, 12:34 PM

## 2015-02-13 NOTE — Progress Notes (Signed)
Occupational Therapy Session Note  Patient Details  Name: Jeremiah Owens MRN: [id number] Date of Birth: [birth year]  Today's Date: 02/13/2015 OT Individual Time: 1430-1500 OT Individual Time Calculation (min): 30 min    Short Term Goals: Week 1:  OT Short Term Goal 1 (Week 1): STG = LTGs due to ELOS  Skilled Therapeutic Interventions/Progress Updates:  Engaged in therapeutic activity with focus on functional transfers.  Pt ambulated to room toilet with RW and contact guard/steady assist when crossing threshold. Min cues provided for safe distance in RW to increase safety.  Pt able to complete clothing management and hygiene post toileting with increased time.  Steady assist when bending down to retrieve pants from around ankles.  Pt ambulated approx 100 feet with steady assist.  Returned to room and left seated in recliner with quick release belt donned and all needs in reach.  Therapy Documentation Precautions:  Precautions Precautions: Fall Restrictions Weight Bearing Restrictions: No General:   Vital Signs: Therapy Vitals Temp: 98.4 F (36.9 C) Temp Source: Oral Pulse Rate: 75 Resp: 18 BP: (!) 134/57 mmHg Patient Position (if appropriate): Sitting Oxygen Therapy SpO2: 100 % O2 Device: Not Delivered Pain: Pain Assessment Pain Assessment: No/denies pain Pain Score: 0-No pain  See FIM for current functional status  Therapy/Group: Individual Therapy  Simonne Come 02/13/2015, 3:11 PM

## 2015-02-13 NOTE — Progress Notes (Signed)
Social Work Assessment and Plan  Patient Details  Name: Jeremiah Owens MRN: [id number] Date of Birth: 05-May-1930  Today's Date: 02/13/2015  Problem List:  Patient Active Problem List   Diagnosis Date Noted  . Basal ganglia infarction 02/12/2015  . Dizziness   . Expressive aphasia   . Past pointing   . Right leg weakness   . Acute ischemic stroke 02/11/2015  . CVA (cerebral infarction) 02/11/2015  . Aspiration pneumonia 02/11/2015  . Aphasia   . Difficulty speaking   . Speech difficult to understand   . Stroke   . Encounter for antineoplastic chemotherapy 01/31/2015  . Constipation 12/20/2014  . Rash 12/20/2014  . Cellulitis 12/06/2014  . Peripheral edema 12/06/2014  . Gait disorder 09/05/2014  . Multiple myeloma   . Paraplegia 06/21/2014  . Postoperative anemia due to acute blood loss 06/21/2014  . 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 Spine metastasis 06/17/2014  . Thoracic spine tumor 06/17/2014  . Metastatic bone cancer 06/15/2014   Past Medical History:  Past Medical History  Diagnosis Date  . Compression fracture   . Allergy   . Anxiety   . Thyroid disease   . S/P radiation therapy 08/21/14-09/04/14    T4-6 25Gy/26f  . S/P radiation therapy 11/29/14-12/12/14    rt prox humerus/shoulder/scapula 25Gy/127f . Encounter for antineoplastic chemotherapy 01/31/2015  . Melanoma   . Bone cancer     t_spine T-5  . Prostate cancer 2002  . Skin cancer 1994    melanoma  right neck   . Multiple myeloma    Past Surgical History:  Past Surgical History  Procedure Laterality Date  . Hernia repair    . Posterior cervical fusion/foraminotomy N/A 06/17/2014    Procedure: Thoracic five laminectomy for epidural tumor resection Thoracic 4-7 posterior lateral arthrodesis, segmental pedicle screw fixation.;  Surgeon: KyAshok PallMD;  [facility];  Service: Neurosurgery;  Laterality: N/A;   Social History:  reports that he has never smoked. He does not have any  smokeless tobacco history on file. He reports that he does not drink alcohol or use illicit drugs.  Family / Support Systems Marital Status: Married How Long?: 55-56 years? Patient Roles: Spouse, Parent, Volunteer Spouse/Significant Other: MaLesly Joslyn wife - [phone]) ; [phone]) Children: LaChaddrick Brue son - [phone]) [place] Supports: JeHenrene Hawking sister - ([phone]) Anticipated Caregiver: wife, she is 8326o and Independent Ability/Limitations of Caregiver: can provide supervision to min guard Caregiver Availability: 24/7 Family Dynamics: attentive, supportive wife  Social History Preferred language: English Religion:  Cultural Background: attends [employer]: Yes Write: Yes Employment Status: Retired Date Retired/Disabled/Unemployed: Retired from the [employer] 31 years of service, many years ago. Worked for a while doing various jobs before retiring completely and taking on voHealth visitorLegal History/Current Legal Issues: none reported Guardian/Conservator: N/A   Abuse/Neglect Physical Abuse: Denies Verbal Abuse: Denies Sexual Abuse: Denies Exploitation of patient/patient's resources: Denies Self-Neglect: Denies  Emotional Status Pt's affect, behavior and adjustment status: Pt is known to CIR team from 06/2014 admission and was noted to be "highly motivated" and "talkative."  At today's visit, pt is visably frustrated with this medical setback, especially after working so hard following back surgery and [facility] and [facility].  He seems discouraged when speaking, having trouble remembering, recalling, and word finding.  Pt still seems motivated to  work with therapists, but reports feeling down or depressed.  CSW will refer pt for neuropsychology and will continue to monitor. Recent Psychosocial Issues: Pt with multiple medical issues, including  ongoing cancer treatments.  Another setback has him discouraged. Psychiatric History: anxiety Substance Abuse History: none reported  Patient / Family Perceptions, Expectations & Goals Pt/Family understanding of illness & functional limitations: Pt seems to understand his condition and limitations, albeit he's frustrated by them.  Wife is optimistic after talking with the neurologist and she is realistic about pt's condition/needs. Premorbid pt/family roles/activities: Pt enjoys Psychologist, occupational work with [employer] on [employer], church functions, Careers information officer, Social research officer, government.  Pt recently starting taking balance classes at the [employer] Anticipated changes in roles/activities/participation: Pt would like to get back to these activities, as he is able.  He realizes it may take some time. Pt/family expectations/goals: Pt would like "to be independent."  [employer]: Other (Comment) ([place] then [facility]) Premorbid Home Care/DME Agencies: Other (Comment) ([place] for PT/OT and DME) Transportation available at discharge: wife Resource referrals recommended: Neuropsychology, Support group (specify)  Discharge Planning Living Arrangements: Spouse/significant other Support Systems: Spouse/significant other, Children, Other relatives, Friends/neighbors, Church/faith community Type of Residence: Private residence Insurance Resources: [employer], Multimedia programmer (specify) Sports administrator) Financial Resources: Radio broadcast assistant Screen Referred: No Money Management: Patient, Spouse Does the patient have any problems obtaining your medications?: No Home Management: wife can manage home management Patient/Family Preliminary Plans: Pt plans to return to his home with his wife available 24/7 as needed, although he'd like to be as independent as possible. Social Work Anticipated Follow Up Needs: HH/OP, Support  Group Expected length of stay: [facility] 7-9 days  Clinical Impression CSW met with pt to introduce self and role of CSW, as well as to complete assessment.  After the visit, CSW spoke with pt's wife via telephone to confirm plan for pt to return home with her to provide 24/7 supervision.  She feels optimistic after talking with neurologist about pt's recovery, but she too, has seen pt a bit more discouraged when he tries to communicate and can't like he normally would.  CSW told pt this is normal and will likely improve with time and speech therapy, but CSW also gave him permission to feel discouraged and frustrated.  Pt smiled at this.  Pt wants to be independent and return to his normal activities.  He still seems motivated, but reports feeling down.  CSW plans to have neuropsychologist visit with pt this week to offer support.  Pt was agreeable to this and to CSW checking in with him again about his emotional well-being.  Wife was appreciative of CIR team from last stay and had positive feelings about pt's hospital stay this time, as well.  Pt was concerned about his chemotherapy shot due tomorrow.  CSW spoke with Marlowe Shores, PA, who confirmed shot will be given on CIR, as there is no need to transfer pt to [facility] for this.  Linna Hoff has shared this with pt and wife and RN, but will discuss with them again, as needed.  Dr. Earlie Server is aware of pt's admission to CIR.  CSW will continue to follow and assist pt/wife as needed.  Lastacia Solum, [name] 02/13/2015, 12:15 PM

## 2015-02-13 NOTE — Progress Notes (Signed)
[place] PHYSICAL MEDICINE & REHABILITATION     PROGRESS NOTE    Subjective/Complaints: Developed diarrhea over night. Now on contact precautions. States that his revlimid gives him diarrhea at home sometimes. Otherwise feels well. '  ROS: Pt denies fever, rash/itching, headache, blurred or double vision, nausea, vomiting, abdominal pain, diarrhea, chest pain, shortness of breath, palpitations, dysuria, dizziness, neck or back pain, bleeding, anxiety, or depression   Objective: Vital Signs: Blood pressure 139/62, pulse 65, temperature 97.8 F (36.6 C), temperature source Oral, resp. rate 16, height 5' 10" (1.778 m), weight 70.8 kg (156 lb 1.4 oz), SpO2 99 %. Dg Swallowing Func-speech Pathology  02/11/2015    Objective Swallowing Evaluation:    Patient Details  Name: Jeremiah Owens MRN: [id number] Date of Birth: 07-05-1930  Today's Date: 02/11/2015 Time: SLP Start Time (ACUTE ONLY): 1340-SLP Stop Time (ACUTE ONLY): 1400 SLP Time Calculation (min) (ACUTE ONLY): 20 min  Past Medical History:  Past Medical History  Diagnosis Date  . Compression fracture   . Allergy   . Anxiety   . Thyroid disease   . S/P radiation therapy 08/21/14-09/04/14    T4-6 25Gy/32f  . S/P radiation therapy 11/29/14-12/12/14    rt prox humerus/shoulder/scapula 25Gy/18f . Encounter for antineoplastic chemotherapy 01/31/2015  . Melanoma   . Bone cancer     t_spine T-5  . Prostate cancer 2002  . Skin cancer 1994    melanoma  right neck   . Multiple myeloma    Past Surgical History:  Past Surgical History  Procedure Laterality Date  . Hernia repair    . Posterior cervical fusion/foraminotomy N/A 06/17/2014    Procedure: Thoracic five laminectomy for epidural tumor resection  Thoracic 4-7 posterior lateral arthrodesis, segmental pedicle screw  fixation.;  Surgeon: KyAshok PallMD;  [facility];  Service:  Neurosurgery;  Laterality: N/A;   HPI:  Other Pertinent Information: RoGeffrey Owens a [age].o. male with a  history of  multiple myeloma treated with Revlamid who presents today with  2 days of right leg weakness and difficulty speaking. He states that this  started quite abruptly. Prior to this, he did not have any problems with  his speech either. He states that he has difficulty with finding his  words, and indeed it is difficult getting history from him because of some  word finding difficulty. MRI shows Acute 18 x 9 mm LEFT basal ganglia  lacunar infarct  No Data Recorded  Assessment / Plan / Recommendation CHL IP CLINICAL IMPRESSIONS 02/11/2015  Therapy Diagnosis Moderate pharyngeal phase dysphagia  Clinical Impression Pt presents with a neuromuscular dysphagia with motor  and sensory deficits due to impairment. There is delayed initiation of the  swallow, particularly dangerous with thin liquids. With all textures there  is weakness of the right lateral wall and possible decreased adduction of  the right arytenoid resulting in silent penetration during the swallow and  residuals in the right lateral channel which are silently aspriated during  and after the swallow. With a head turn right laryngeal closure duirng the  swallow improves and residuals are eliminated. Nectar thick liquids are  still warranted due to delayed timing of swallow. Recommend pt consume a  dys 3 (mechanical soft) diet with nectar thick liquids. Initially pt will  need full supervision to ensure head turn right. As pt demonstrates  independence, supervision can fade. SLP will follow for tolerance.       CHL IP TREATMENT RECOMMENDATION 02/11/2015  Treatment  Recommendations Therapy as outlined in treatment plan below     CHL IP DIET RECOMMENDATION 02/11/2015  SLP Diet Recommendations (None)  Liquid Administration via (None)  Medication Administration Whole meds with puree  Compensations Other (Comment);Small sips/bites;Slow rate  Postural Changes and/or Swallow Maneuvers (None)     CHL IP OTHER RECOMMENDATIONS 02/11/2015  Recommended Consults (None)  Oral Care  Recommendations Oral care BID  Other Recommendations Order thickener from pharmacy     No flowsheet data found.   CHL IP FREQUENCY AND DURATION 02/11/2015  Speech Therapy Frequency (ACUTE ONLY) min 2x/week  Treatment Duration 2 weeks     Pertinent Vitals/Pain NA    SLP Swallow Goals No flowsheet data found.  No flowsheet data found.    CHL IP REASON FOR REFERRAL 02/11/2015  Reason for Referral Objectively evaluate swallowing function               DeBlois, Katherene Ponto 02/11/2015, 2:16 PM     Recent Labs  02/12/15 1050 02/13/15 0546  WBC 2.6* 2.7*  HGB 13.5 12.2*  HCT 39.7 35.7*  PLT 110* PENDING    Recent Labs  02/12/15 1050 02/13/15 0546  NA 138 135  K 3.5 3.6  CL 102 104  GLUCOSE 135* 93  BUN 10 11  CREATININE 0.89 0.89  CALCIUM 8.4* 8.0*   CBG (last 3)   Recent Labs  02/10/15 2202  GLUCAP 112*    Wt Readings from Last 3 Encounters:  02/12/15 70.8 kg (156 lb 1.4 oz)  02/11/15 73.3 kg (161 lb 9.6 oz)  01/31/15 73.755 kg (162 lb 9.6 oz)    Physical Exam:  Constitutional: He is oriented to person, place, and time.  HENT:  Oral mucosa pink, moist, dentition good Eyes: EOM are normal.  Neck: Normal range of motion. Neck supple. No thyromegaly present.  Cardiovascular: Normal rate and regular rhythm. no murmurs, rubs Respiratory: Effort normal and breath sounds normal. No respiratory distress.  GI: Soft. Bowel sounds are normal. He exhibits no distension.  Neurological: He is alert and oriented to person, place, and time.  Speech is mildly slurred but intelligible. Right central 7. He is oriented to person place and date. Continued word finding deficits and processing delays especially with more complex information. RUE: 4/5 prox to distal. Decreased FMC. RLE: 3+hf, 4/5 ke, adf/apf. LLE: 4+/5. Decreased proprioception in both legs (chronic)  Skin: Skin is warm and dry.  Psychiatric: He has a normal mood and affect. His behavior is  normal    Assessment/Plan: 1. Functional deficits secondary to left basal ganglia infarct which require 3+ hours per day of interdisciplinary therapy in a comprehensive inpatient rehab setting. Physiatrist is providing close team supervision and 24 hour management of active medical problems listed below. Physiatrist and rehab team continue to assess barriers to discharge/monitor patient progress toward functional and medical goals. FIM:       FIM - Toileting Toileting steps completed by patient: Performs perineal hygiene, Adjust clothing prior to toileting Toileting Assistive Devices: Grab bar or rail for support Toileting: 3: Mod-Patient completed 2 of 3 steps           Comprehension Comprehension Mode: Auditory Comprehension: 5-Understands complex 90% of the time/Cues < 10% of the time  Expression Expression Mode: Verbal Expression: 3-Expresses basic 50 - 74% of the time/requires cueing 25 - 50% of the time. Needs to repeat parts of sentences.  Social Interaction Social Interaction: 4-Interacts appropriately 75 - 89% of the time - Needs redirection for  appropriate language or to initiate interaction.  Problem Solving Problem Solving: 5-Solves basic 90% of the time/requires cueing < 10% of the time  Memory Memory: 4-Recognizes or recalls 75 - 89% of the time/requires cueing 10 - 24% of the time  Medical Problem List and Plan: 1. Functional deficits secondary to left basal ganglia infarct 2. DVT Prophylaxis/Anticoagulation: Fixed dose of Coumadin while maintained on Revlimid 3. Pain Management: Robaxin as needed 4. Dysphagia. Dysphagia #3 nectar liquids. Push fluids given diarrhea 5. Neuropsych: This patient is capable of making decisions on his own behalf. 6. Skin/Wound Care: Routine skin checks 7. Fluids/Electrolytes/Nutrition: Routine I&O. I personally reviewed the patient's labs today and normal 8. History of multiple myeloma/spinal metastasis. Chemotherapy  ongoing per Dr. Earlie Server weekly  -counts stable today upon lab review 9. Aspiration pneumonia. Unasyn changed to Augmentin 02/12/2015 through 7/15. Cbc noted, stable 10. BPH. Check PVRs 3. Continue Flomax 11. Diarrhea: c diff protocol, contact precautions  -likely due to abx,ctx  -supportive care, push fluids LOS (Days) 1 A FACE TO FACE EVALUATION WAS PERFORMED  Cleo Santucci T 02/13/2015 7:46 AM

## 2015-02-13 NOTE — Progress Notes (Signed)
Nurse driven C.diff protocol started after patient had several loose stools.  Patient educated with protocol; patient had no questions at this time.  Will continue to monitor.

## 2015-02-13 NOTE — Evaluation (Signed)
Occupational Therapy Assessment and Plan  Patient Details  Name: Gunther Zawadzki MRN: [id number] Date of Birth: 02-13-1930  OT Diagnosis: cognitive deficits, hemiplegia affecting dominant side and muscle weakness (generalized) Rehab Potential: Rehab Potential (ACUTE ONLY): Good ELOS: 7-10 days   Today's Date: 02/13/2015 OT Individual Time: 8144-8185 OT Individual Time Calculation (min): 60 min     Problem List:  Patient Active Problem List   Diagnosis Date Noted  . Basal ganglia infarction 02/12/2015  . Dizziness   . Expressive aphasia   . Past pointing   . Right leg weakness   . Acute ischemic stroke 02/11/2015  . CVA (cerebral infarction) 02/11/2015  . Aspiration pneumonia 02/11/2015  . Aphasia   . Difficulty speaking   . Speech difficult to understand   . Stroke   . Encounter for antineoplastic chemotherapy 01/31/2015  . Constipation 12/20/2014  . Rash 12/20/2014  . Cellulitis 12/06/2014  . Peripheral edema 12/06/2014  . Gait disorder 09/05/2014  . Multiple myeloma   . Paraplegia 06/21/2014  . Postoperative anemia due to acute blood loss 06/21/2014  . 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 Spine metastasis 06/17/2014  . Thoracic spine tumor 06/17/2014  . Metastatic bone cancer 06/15/2014    Past Medical History:  Past Medical History  Diagnosis Date  . Compression fracture   . Allergy   . Anxiety   . Thyroid disease   . S/P radiation therapy 08/21/14-09/04/14    T4-6 25Gy/9f  . S/P radiation therapy 11/29/14-12/12/14    rt prox humerus/shoulder/scapula 25Gy/139f . Encounter for antineoplastic chemotherapy 01/31/2015  . Melanoma   . Bone cancer     t_spine T-5  . Prostate cancer 2002  . Skin cancer 1994    melanoma  right neck   . Multiple myeloma    Past Surgical History:  Past Surgical History  Procedure Laterality Date  . Hernia repair    . Posterior cervical fusion/foraminotomy N/A 06/17/2014    Procedure: Thoracic five laminectomy for epidural  tumor resection Thoracic 4-7 posterior lateral arthrodesis, segmental pedicle screw fixation.;  Surgeon: KyAshok PallMD;  [facility];  Service: Neurosurgery;  Laterality: N/A;    Assessment & Plan Clinical Impression: Patient is a [age].o. right handed male with history of multiple myeloma on chemotherapy on Revlimid, radiation therapy for spinal metastasis 08/21/2014-09/04/2014 as well as 11/29/2014-12/12/2014 per Dr. MoLisbeth Renshawnd Dr. MoEarlie ServerChronic Fixed dose Coumadin while maintained on Revlimid Well known to rehabilitation services from admission November 2015 for thoracic spine decompression and fusion related to multiple myeloma. Patient lives with his wife used a rolling walker to ambulate short community distances prior to admission. Presented 02/11/2015 with right sided weakness and slurred speech 2 days. MRI of the brain showed acute left basal ganglia lacunar infarction as well as old right caudate lacunar infarct. MRA of the head with no large vessel occlusion or stenosis. Carotid Doppler but no ICA stenosis. Echocardiogram with ejection fraction of 6063%rade 1 diastolic dysfunction. Patient did not receive TPA. Neurology consulted presently on aspirin for CVA prophylaxis and fixed dose Coumadin resumed at 2 mg daily as prior to admission. Suspect aspiration pneumonia currently maintained on Unasyn changed to Augmentin 02/12/2015 5 days. Dysphagia #3 nectar thick liquid diet. [facility] 02/12/2015 with recommendations of physical medicine rehabilitation consult.   Patient transferred to CIR on 02/12/2015 .    Patient currently requires mod with basic self-care skills secondary  to unbalanced muscle activation and decreased coordination, decreased problem solving, decreased safety awareness, decreased memory and delayed processing and decreased standing balance, hemiplegia and decreased  balance strategies.  Prior to hospitalization, patient could complete ADLs with modified independent and occasional physical assist with dressing tasks.  Patient will benefit from skilled intervention to increase independence with basic self-care skills prior to discharge home with care partner.  Anticipate patient will require 24 hour supervision and follow up TBD.  OT - End of Session Activity Tolerance: Tolerates 30+ min activity with multiple rests Endurance Deficit: Yes Endurance Deficit Description: required multiple rest breaks OT Assessment Rehab Potential (ACUTE ONLY): Good OT Patient demonstrates impairments in the following area(s): Balance;Cognition;Endurance;Motor;Safety OT Basic ADL's Functional Problem(s): Eating;Grooming;Bathing;Dressing;Toileting OT Transfers Functional Problem(s): Toilet;Tub/Shower OT Additional Impairment(s): Fuctional Use of Upper Extremity OT Plan OT Intensity: Minimum of 1-2 x/day, 45 to 90 minutes OT Frequency: 5 out of 7 days OT Duration/Estimated Length of Stay: 7-10 days OT Treatment/Interventions: Balance/vestibular training;Cognitive remediation/compensation;Discharge planning;Disease mangement/prevention;DME/adaptive equipment instruction;Functional mobility training;Neuromuscular re-education;Patient/family education;Pain management;Psychosocial support;Self Care/advanced ADL retraining;Therapeutic Activities;Therapeutic Exercise;UE/LE Coordination activities;UE/LE Strength taining/ROM OT Self Feeding Anticipated Outcome(s): Mod I OT Basic Self-Care Anticipated Outcome(s): Supervision OT Toileting Anticipated Outcome(s): Mod I OT Bathroom Transfers Anticipated Outcome(s): Mod I OT Recommendation Patient destination: Home Follow Up Recommendations: 24 hour supervision/assistance (follow up TBD)   Skilled Therapeutic Intervention OT eval completed with discussion of OT purpose, POC, goals, and ELOS.  ADL assessment completed at sit > stand  level at sink with min steady assist for balance while standing due to cervical flexion and posterior lean in standing.  Mod assist sit > stand initially due to low surface, progressed to min assist throughout rest of session with tactile cue to facilitate forward weight shift.  Pt required increased time with all self-care tasks.  Pt with difficulty with memory and word finding during session, often losing track, benefiting from yes/no questions.  9 hole peg test completed with Rt: 1:11 and Lt: 48 seconds. Discussed decreased [place] and carry over to functional tasks.  OT Evaluation Precautions/Restrictions  Precautions Precautions: Fall Restrictions Weight Bearing Restrictions: No Pain Pain Assessment Pain Assessment: No/denies pain Home Living/Prior Functioning Home Living Available Help at Discharge: Family, Available 24 hours/day Type of Home: House Home Access: Ramped entrance Home Layout: One level Bathroom Shower/Tub: Multimedia programmer: Handicapped height Bathroom Accessibility: Yes Additional Comments: home fully handicapped accessible  Lives With: Spouse Prior Function Level of Independence: Independent with basic ADLs, Requires assistive device for independence Driving: Yes ADL  See FIM Vision/Perception  Vision- History Baseline Vision/History: Wears glasses Wears Glasses: At all times Patient Visual Report: No change from baseline Vision- Assessment Vision Assessment?: No apparent visual deficits  Cognition Arousal/Alertness: Awake/alert Orientation Level: Person;Place;Situation Person: Oriented Place: Oriented Situation: Oriented Year: Other (Comment) (2015) Month: August Day of Week: Correct Immediate Memory Recall: Sock;Blue;Bed Memory Recall: Sock;Blue Memory Recall Sock: Without Cue Memory Recall Blue: With Cue Safety/Judgment: Impaired Sensation Sensation Light Touch: Appears Intact Stereognosis: Not tested Hot/Cold: Not  tested Proprioception: Appears Intact Coordination Fine Motor Movements are Fluid and Coordinated: No Finger Nose Finger Test: WFLs 9 Hole Peg Test: Rt:1:11 and Lt: 48 seconds Extremity/Trunk Assessment RUE Assessment RUE Assessment: Exceptions to WFL (ROM WFL, strength grossly 4/5 overall) LUE Assessment LUE Assessment: Exceptions to [facility] (shoulder flexion limited to 45 degrees due to arthritis in shoulder (premorbid), strength grossly 5/5 and ROM WFL distal)  FIM:  FIM - Grooming Grooming Steps: Wash, rinse, dry face;Wash, rinse,  dry hands;Oral care, brush teeth, clean dentures;Brush, comb hair;Shave or apply make-up Grooming: 4: Steadying assist  or patient completes 3 of 4 or 4 of 5 steps FIM - Bathing Bathing Steps Patient Completed: Chest;Right Arm;Left Arm;Abdomen;Right upper leg;Left upper leg Bathing: 3: Mod-Patient completes 5-7 40f10 parts or 50-74% FIM - Upper Body Dressing/Undressing Upper body dressing/undressing steps patient completed: Thread/unthread right sleeve of pullover shirt/dresss;Thread/unthread left sleeve of pullover shirt/dress;Put head through opening of pull over shirt/dress;Pull shirt over trunk Upper body dressing/undressing: 5: Set-up assist to: Obtain clothing/put away FIM - Lower Body Dressing/Undressing Lower body dressing/undressing steps patient completed: Thread/unthread right underwear leg;Thread/unthread left underwear leg;Pull underwear up/down;Thread/unthread right pants leg;Thread/unthread left pants leg;Pull pants up/down;Fasten/unfasten pants;Don/Doff right sock;Don/Doff left sock;Don/Doff right shoe;Don/Doff left shoe Lower body dressing/undressing: 4: Steadying Assist   Refer to Care Plan for Long Term Goals  Recommendations for other services: None  Discharge Criteria: Patient will be discharged from OT if patient refuses treatment 3 consecutive times without medical reason, if treatment goals not met, if there is a change in medical  status, if patient makes no progress towards goals or if patient is discharged from hospital.  The above assessment, treatment plan, treatment alternatives and goals were discussed and mutually agreed upon: by patient  HSimonne Come7/07/2015, 12:01 PM

## 2015-02-14 ENCOUNTER — Inpatient Hospital Stay (HOSPITAL_COMMUNITY): Payer: Medicare Other | Admitting: Speech Pathology

## 2015-02-14 ENCOUNTER — Inpatient Hospital Stay (HOSPITAL_COMMUNITY): Payer: Medicare Other

## 2015-02-14 ENCOUNTER — Ambulatory Visit: Payer: Medicare Other

## 2015-02-14 ENCOUNTER — Inpatient Hospital Stay (HOSPITAL_COMMUNITY): Payer: Medicare Other | Admitting: Physical Therapy

## 2015-02-14 ENCOUNTER — Other Ambulatory Visit: Payer: Medicare Other

## 2015-02-14 NOTE — Progress Notes (Signed)
Speech Language Pathology Daily Session Note  Patient Details  Name: Jeremiah Owens MRN: [id number] Date of Birth: 1929/08/23  Today's Date: 02/14/2015 SLP Individual Time: 1300-1400 SLP Individual Time Calculation (min): 60 min  Short Term Goals: Week 1: SLP Short Term Goal 1 (Week 1): short term goals = long term goals due to length of stay  Skilled Therapeutic Interventions: Skilled treatment session focused on word-finding goals. Upon arrival, patient was sitting upright in wheelchair and asked to use the bathroom. Patient ambulated to bathroom and required Min A verbal cues for safety with self-care tasks. Patient also participated in a verbal expression task with focus on use of word description to maximize use of strategy during functional conversation. Patient required Mod A question cues to complete task. At end of session, patient asked to brush his teeth, NT made aware. Patient left upright in recliner with quick release belt in place. Continue with current plan of care.    FIM:  Comprehension Comprehension Mode: Auditory Comprehension: 5-Understands complex 90% of the time/Cues < 10% of the time Expression Expression Mode: Verbal Expression: 3-Expresses basic 50 - 74% of the time/requires cueing 25 - 50% of the time. Needs to repeat parts of sentences. Social Interaction Social Interaction: 4-Interacts appropriately 75 - 89% of the time - Needs redirection for appropriate language or to initiate interaction. Problem Solving Problem Solving: 5-Solves basic 90% of the time/requires cueing < 10% of the time Memory Memory: 5-Recognizes or recalls 90% of the time/requires cueing < 10% of the time  Pain Pain Assessment Pain Assessment: No/denies pain  Therapy/Group: Individual Therapy  Ki Luckman 02/14/2015, 3:50 PM

## 2015-02-14 NOTE — Progress Notes (Signed)
Physical Therapy Session Note  Patient Details  Name: Jeremiah Owens MRN: [id number] Date of Birth: 03-19-1930  Today's Date: 02/14/2015 PT Individual Time: 0900-1000 PT Individual Time Calculation (min): 60 min   Short Term Goals: Week 1:  PT Short Term Goal 1 (Week 1): Will perform bed mobility modI with no cues needed for safety PT Short Term Goal 2 (Week 1): Will perform bed <> w/c transfer with CGA and min cues for safety PT Short Term Goal 3 (Week 1): Will perform w/c propulsion with supervision >211ft PT Short Term Goal 4 (Week 1): Will perform up/down 12 stairs with CGA and min cues needed for hand placement PT Short Term Goal 5 (Week 1): Will perfom gait with RW x175 with supervision  Skilled Therapeutic Interventions/Progress Updates:  PT session focused on activity tolerance, gait training, standing balance, and sit<>stand. Pt received sitting in chair with quick release belt. Pt required min A sit<>stand using RW at beginning of session progressed to supervision without AD after intervention. To facilitate forward lean and hip hinge, performed transfers with chair in front (pt reaching forward for arm rest to push down with arms) . Pt ambulates with RW 190 ft X 2 with supervision demonstrating shuffling gait pattern. Pt demonstrates posterior lean during balance activities. Standing balance activities x 60 sec each trial- normal BOS, narrow BOS, Eyes closed, modified tandem stance, feet on wedge (to facilitate anterior weight shift and ankle strategy, and balance beam (static progressed to reaching using R UE only to facilitate forward weight shift) supervision to Max assist. Performed modified plantigrade forward step weight shifts and side stepping requiring supervision and cuing to pick foot up opposed to sliding progressed from bilat UE to one UE support on raised mat. Plan to use low height objects to step over to facilitate foot clearance with gait. Pt returned to chair in room with  quick release belt and all needs in reach.  Therapy Documentation Precautions:  Precautions Precautions: Fall Restrictions Weight Bearing Restrictions: No  Pain: Pain Assessment Pain Assessment: No/denies pain   See FIM for current functional status  Therapy/Group: Individual Therapy  Elsie Ra 02/14/2015, 10:16 AM

## 2015-02-14 NOTE — Progress Notes (Signed)
[place] PHYSICAL MEDICINE & REHABILITATION     PROGRESS NOTE    Subjective/Complaints: Diarrhea stopped. No issues there today. Has questions about velcade administration.  ROS: Pt denies fever, rash/itching, headache, blurred or double vision, nausea, vomiting, abdominal pain, diarrhea, chest pain, shortness of breath, palpitations, dysuria, dizziness, neck or back pain, bleeding, anxiety, or depression   Objective: Vital Signs: Blood pressure 115/63, pulse 82, temperature 98.5 F (36.9 C), temperature source Oral, resp. rate 18, height '5\' 10"'  (1.778 m), weight 72.1 kg (158 lb 15.2 oz), SpO2 95 %. No results found.  Recent Labs  02/12/15 1050 02/13/15 0546  WBC 2.6* 2.7*  HGB 13.5 12.2*  HCT 39.7 35.7*  PLT 110* 98*    Recent Labs  02/12/15 1050 02/13/15 0546  NA 138 135  K 3.5 3.6  CL 102 104  GLUCOSE 135* 93  BUN 10 11  CREATININE 0.89 0.89  CALCIUM 8.4* 8.0*   CBG (last 3)  No results for input(s): GLUCAP in the last 72 hours.  Wt Readings from Last 3 Encounters:  02/14/15 72.1 kg (158 lb 15.2 oz)  02/11/15 73.3 kg (161 lb 9.6 oz)  01/31/15 73.755 kg (162 lb 9.6 oz)    Physical Exam:  Constitutional: He is oriented to person, place, and time.  HENT:  Oral mucosa pink, moist, dentition good Eyes: EOM are normal.  Neck: Normal range of motion. Neck supple. No thyromegaly present.  Cardiovascular: Normal rate and regular rhythm. no murmurs, rubs Respiratory: Effort normal and breath sounds normal. No respiratory distress.  GI: Soft. Bowel sounds are normal. He exhibits no distension.  Neurological: He is alert and oriented to person, place, and time.  Speech is mildly slurred but intelligible. Right central 7. He is oriented to person place and date. Continued word finding deficits. RUE: 4/5 prox to distal. Decreased FMC. RLE: 3+hf, 4/5 ke, adf/apf. LLE: 4+/5. Decreased proprioception in both legs (chronic)  Skin: Skin is warm and dry.   Psychiatric: He has a normal mood and affect. His behavior is normal    Assessment/Plan: 1. Functional deficits secondary to left basal ganglia infarct which require 3+ hours per day of interdisciplinary therapy in a comprehensive inpatient rehab setting. Physiatrist is providing close team supervision and 24 hour management of active medical problems listed below. Physiatrist and rehab team continue to assess barriers to discharge/monitor patient progress toward functional and medical goals. FIM: FIM - Bathing Bathing Steps Patient Completed: Chest, Right Arm, Left Arm, Abdomen, Right upper leg, Left upper leg Bathing: 3: Mod-Patient completes 5-7 14f10 parts or 50-74%  FIM - Upper Body Dressing/Undressing Upper body dressing/undressing steps patient completed: Thread/unthread right sleeve of pullover shirt/dresss, Thread/unthread left sleeve of pullover shirt/dress, Put head through opening of pull over shirt/dress, Pull shirt over trunk Upper body dressing/undressing: 5: Set-up assist to: Obtain clothing/put away FIM - Lower Body Dressing/Undressing Lower body dressing/undressing steps patient completed: Thread/unthread right underwear leg, Thread/unthread left underwear leg, Pull underwear up/down, Thread/unthread right pants leg, Thread/unthread left pants leg, Pull pants up/down, Fasten/unfasten pants, Don/Doff right sock, Don/Doff left sock, Don/Doff right shoe, Don/Doff left shoe Lower body dressing/undressing: 4: Steadying Assist  FIM - Toileting Toileting steps completed by patient: Adjust clothing after toileting, Adjust clothing prior to toileting, Performs perineal hygiene Toileting Assistive Devices: Grab bar or rail for support Toileting: 4: Steadying assist  FIM - TRadio producerDevices: WInsurance account managerTransfers: 4-To toilet/BSC: Min A (steadying Pt. > 75%), 4-From toilet/BSC: Min A (  steadying Pt. > 75%)  FIM - Bed/Chair Transfer Bed/Chair  Transfer Assistive Devices: Copy: 4: Bed > Chair or W/C: Min A (steadying Pt. > 75%), 4: Chair or W/C > Bed: Min A (steadying Pt. > 75%)  FIM - Locomotion: Wheelchair Distance: 175 Locomotion: Wheelchair: 5: Travels 150 ft or more: maneuvers on rugs and over door sills with supervision, cueing or coaxing FIM - Locomotion: Ambulation Locomotion: Ambulation Assistive Devices: Administrator Ambulation/Gait Assistance: 4: Min assist Locomotion: Ambulation: 4: Travels 150 ft or more with minimal assistance (Pt.>75%)  Comprehension Comprehension Mode: Auditory Comprehension: 6-Follows complex conversation/direction: With extra time/assistive device  Expression Expression Mode: Verbal Expression: 3-Expresses basic 50 - 74% of the time/requires cueing 25 - 50% of the time. Needs to repeat parts of sentences.  Social Interaction Social Interaction: 4-Interacts appropriately 75 - 89% of the time - Needs redirection for appropriate language or to initiate interaction.  Problem Solving Problem Solving: 5-Solves basic 90% of the time/requires cueing < 10% of the time  Memory Memory: 6-More than reasonable amt of time  Medical Problem List and Plan: 1. Functional deficits secondary to left basal ganglia infarct 2. DVT Prophylaxis/Anticoagulation: Fixed dose of Coumadin while maintained on Revlimid to continue 3. Pain Management: Robaxin as needed 4. Dysphagia. Dysphagia #3 nectar liquids. Tolerating well 5. Neuropsych: This patient is capable of making decisions on his own behalf. 6. Skin/Wound Care: Routine skin checks 7. Fluids/Electrolytes/Nutrition: Routine I&O. I personally reviewed the patient's labs today and normal 8. History of multiple myeloma/spinal metastasis. Chemotherapy ongoing per Dr. Gwenyth Allegra weekly due today----will check with oncology regarding administration, should be able to be given here  -counts stable  9. Aspiration pneumonia. Unasyn  changed to Augmentin 02/12/2015 through 7/15. Cbc noted, stable 10. BPH. Check PVRs 3. Continue Flomax 11. Diarrhea: resolved  LOS (Days) 2 A FACE TO FACE EVALUATION WAS PERFORMED  Deniese Oberry T 02/14/2015 7:54 AM

## 2015-02-14 NOTE — Progress Notes (Signed)
Occupational Therapy Session Note  Patient Details  Name: Jeremiah Owens MRN: [id number] Date of Birth: 1929-10-16  Today's Date: 02/14/2015 OT Individual Time:0800-0900 1100-1130 OT Individual Time Calculation (min): 30 min and 60 min    Short Term Goals: Week 1:  OT Short Term Goal 1 (Week 1): STG = LTGs due to ELOS  Skilled Therapeutic Interventions/Progress Updates:    Session 1: Pt seen for 1:1 OT session with a focus on ADL retraining, functional transfers, sit<>stand, standing balance, activity tolerance, and safety awareness. Pt received seated in w/c agreeable to participate in therapy session. Pt completed sit>stand and ambulated to bathroom via RW with min A. Pt completed shower transfer to shower seat with min A. However, pt unable to shower due to hospital maintenance. Pt then completed dressing sit<>stand from w/c level at sink with min A. Pt completed UB dressing with set-up and LB dressing with steadying A predominantly due to clothing management in standing. Pt then engaged in grooming at sink level with steadying A, and slight perseveration noted with tooth brushing. Pt left seated in recliner with all needs in reach.   Session 2: Pt seen for 1:1 OT session with a focus on functional ambulation, dynamic standing balance, cognitive dual task, activity tolerance, and safety awareness. Pt received seated in recliner agreeable to participate in therapy session. Pt completed functional ambulation to day room via RW with min guard assist apprx 325'. Pt noted with signs of fatigue towards end of mobility but did not verbally state he felt tired. Pt then engaged in mildly complex pipe tree task in standing for apprx 6 min with SBA. Pt demonstrated good functional problem solving during task. Pt then completed dynamic standing balance task of watering plants with min guard-SBA, with pt requiring min guard predominantly during turn changes. Pt then completed functional ambulation of 325' back  to room with min guard assist. Pt left seated in recliner with all needs in reach.   Therapy Documentation Precautions:  Precautions Precautions: Fall Restrictions Weight Bearing Restrictions: No General:   Vital Signs: Therapy Vitals Temp: 98.5 F (36.9 C) Temp Source: Oral Pulse Rate: 82 Resp: 18 BP: 115/63 mmHg Patient Position (if appropriate): Lying Oxygen Therapy SpO2: 95 % O2 Device: Not Delivered Pain:   ADL:   Exercises:   Other Treatments:    See FIM for current functional status  Therapy/Group: Individual Therapy  Dorann Ou 02/14/2015, 9:00 AM

## 2015-02-14 NOTE — Patient Care Conference (Signed)
Inpatient RehabilitationTeam Conference and Plan of Care Update Date: 02/13/2015   Time: 2:45 PM    Patient Name: Jeremiah Owens      Medical Record Number: [id number]  Date of Birth: [birth year] Sex: Male         Room/Bed: 4M07C/4M07C-01 Payor Info: Payor: MEDICARE / Plan: MEDICARE PART A AND B / Product Type: *No Product type* /    Admitting Diagnosis: CVA  Admit Date/Time:  02/12/2015  4:46 PM Admission Comments: No comment available   Primary Diagnosis:  Basal ganglia infarction Principal Problem: Basal ganglia infarction  Patient Active Problem List   Diagnosis Date Noted  . Basal ganglia infarction 02/12/2015  . Dizziness   . Expressive aphasia   . Past pointing   . Right leg weakness   . Acute ischemic stroke 02/11/2015  . CVA (cerebral infarction) 02/11/2015  . Aspiration pneumonia 02/11/2015  . Aphasia   . Difficulty speaking   . Speech difficult to understand   . Stroke   . Encounter for antineoplastic chemotherapy 01/31/2015  . Constipation 12/20/2014  . Rash 12/20/2014  . Cellulitis 12/06/2014  . Peripheral edema 12/06/2014  . Gait disorder 09/05/2014  . Multiple myeloma   . Paraplegia 06/21/2014  . Postoperative anemia due to acute blood loss 06/21/2014  . 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 Spine metastasis 06/17/2014  . Thoracic spine tumor 06/17/2014  . Metastatic bone cancer 06/15/2014    Expected Discharge Date: Expected Discharge Date: 02/21/15  Team Members Present: Physician leading conference: Dr. [name] Social Worker Present: Lennart Pall, LCSW Nurse Present: Heather Roberts, RN PT Present: Carney Living, PT OT Present: Gareth Morgan, OT SLP Present: Weston Anna, SLP PPS Coordinator present : Daiva Nakayama, RN, CRRN     Current Status/Progress Goal Weekly Team Focus  Medical   left basal ganglia infarct, hx of MM. word finding deficits, diarrhea overnight  improve activity tolerance,   bowels, juggling of treatment    Bowel/Bladder   Incontinent at times Bowel and Blaader. Having loose stools  Be continent Bowel/Bladder, firm up stools  Timed toilet, meds    Swallow/Nutrition/ Hydration   Dys. 3 nectar-thick with Intermittent Supervision   least restrictive PO Mod I   carryover and trials of advanced textures    ADL's   min/steady assist for standing balance and transfers  Supervision-Mod I overall  RUE NMR, memory, pt/family education   Mobility   supervision bed mobility, minA transfers, gait and stairs d/t unsteadiness  supervision overall  balance, activity tolerance, safety awareness, pt/family education   Communication   Mod assist   Min assist   education and use of word finding strategies    Safety/Cognition/ Behavioral Observations  appers grossly WFL   TBD  will continue to monitor    Pain   denies any pain  less than 3 out of 10  Offer PRN pain meds as needed   Skin   Skin tear to both elbows, sacrum red  No breakdown   Assess skin daily, tegaderms to elbows     Rehab Goals Patient on target to meet rehab goals: Yes Rehab Goals Revised: none - pt's first conference *See Care Plan and progress notes for long and short-term goals.  Barriers to Discharge: permorbid deficits, impaired balance    Possible Resolutions to Barriers:  NMR, adaptive equipment,    Discharge Planning/Teaching Needs:  Pt plans to return to his home at d/c with his wife to provide 24/7 supervision.  Pt's wife is available  to come for family education.   Team Discussion:  Pt is known to CIR team from previous admission 06/2014.  He is motivated and has overall supervision goals.  Pt is currently on D3 diet and nectar thick liquids.  He has word finding deficits.  Revisions to Treatment Plan:  none   Continued Need for Acute Rehabilitation Level of Care: The patient requires daily medical management by a physician with specialized training in physical medicine and rehabilitation for the following  conditions: Daily direction of a multidisciplinary physical rehabilitation program to ensure safe treatment while eliciting the highest outcome that is of practical value to the patient.: Yes Daily medical management of patient stability for increased activity during participation in an intensive rehabilitation regime.: Yes Daily analysis of laboratory values and/or radiology reports with any subsequent need for medication adjustment of medical intervention for : Neurological problems;Other  Saxton Chain, [name] 02/14/2015, 9:33 AM

## 2015-02-14 NOTE — Progress Notes (Signed)
Social Work Patient ID: Jeremiah Owens, male   DOB: [birth year], 81 y.o.   MRN: [id number]   Lynnda Child, LCSW Social Worker Signed  Patient Care Conference 02/14/2015 9:33 AM    Expand All Collapse All   Inpatient RehabilitationTeam Conference and Plan of Care Update Date: 02/13/2015 Time: 2:45 PM    Patient Name: Jeremiah Owens  Medical Record Number: [id number]  Date of Birth: February 06, 1930 Sex: Male  Room/Bed: 4M07C/4M07C-01 Payor Info: Payor: MEDICARE / Plan: MEDICARE PART A AND B / Product Type: *No Product type* /    Admitting Diagnosis: CVA  Admit Date/Time: 02/12/2015 4:46 PM Admission Comments: No comment available   Primary Diagnosis: Basal ganglia infarction Principal Problem: Basal ganglia infarction  Patient Active Problem List   Diagnosis Date Noted  . Basal ganglia infarction 02/12/2015  . Dizziness   . Expressive aphasia   . Past pointing   . Right leg weakness   . Acute ischemic stroke 02/11/2015  . CVA (cerebral infarction) 02/11/2015  . Aspiration pneumonia 02/11/2015  . Aphasia   . Difficulty speaking   . Speech difficult to understand   . Stroke   . Encounter for antineoplastic chemotherapy 01/31/2015  . Constipation 12/20/2014  . Rash 12/20/2014  . Cellulitis 12/06/2014  . Peripheral edema 12/06/2014  . Gait disorder 09/05/2014  . Multiple myeloma   . Paraplegia 06/21/2014  . Postoperative anemia due to acute blood loss 06/21/2014  . 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 Spine metastasis 06/17/2014  . Thoracic spine tumor 06/17/2014  . Metastatic bone cancer 06/15/2014    Expected Discharge Date: Expected Discharge Date: 02/21/15  Team Members Present: Physician leading conference: Dr. [name] Social Worker Present: Lennart Pall, LCSW Nurse Present: Heather Roberts, RN PT Present: Carney Living, PT OT Present: Gareth Morgan, OT SLP  Present: Weston Anna, SLP PPS Coordinator present : Daiva Nakayama, RN, CRRN    Current Status/Progress Goal Weekly Team Focus  Medical   left basal ganglia infarct, hx of MM. word finding deficits, diarrhea overnight  improve activity tolerance,   bowels, juggling of treatment   Bowel/Bladder   Incontinent at times Bowel and Blaader. Having loose stools  Be continent Bowel/Bladder, firm up stools  Timed toilet, meds    Swallow/Nutrition/ Hydration   Dys. 3 nectar-thick with Intermittent Supervision   least restrictive PO Mod I   carryover and trials of advanced textures    ADL's   min/steady assist for standing balance and transfers  Supervision-Mod I overall  RUE NMR, memory, pt/family education   Mobility   supervision bed mobility, minA transfers, gait and stairs d/t unsteadiness  supervision overall  balance, activity tolerance, safety awareness, pt/family education   Communication   Mod assist   Min assist   education and use of word finding strategies    Safety/Cognition/ Behavioral Observations  appers grossly WFL   TBD  will continue to monitor    Pain   denies any pain  less than 3 out of 10  Offer PRN pain meds as needed   Skin   Skin tear to both elbows, sacrum red  No breakdown   Assess skin daily, tegaderms to elbows     Rehab Goals Patient on target to meet rehab goals: Yes Rehab Goals Revised: none - pt's first conference *See Care Plan and progress notes for long and short-term goals.  Barriers to Discharge: permorbid deficits, impaired balance    Possible Resolutions to Barriers:  NMR, adaptive equipment,  Discharge Planning/Teaching Needs:  Pt plans to return to his home at d/c with his wife to provide 24/7 supervision.  Pt's wife is available to come for family education.   Team Discussion:  Pt is known to CIR team from previous admission 06/2014. He is motivated and has overall supervision  goals. Pt is currently on D3 diet and nectar thick liquids. He has word finding deficits.  Revisions to Treatment Plan:  none   Continued Need for Acute Rehabilitation Level of Care: The patient requires daily medical management by a physician with specialized training in physical medicine and rehabilitation for the following conditions: Daily direction of a multidisciplinary physical rehabilitation program to ensure safe treatment while eliciting the highest outcome that is of practical value to the patient.: Yes Daily medical management of patient stability for increased activity during participation in an intensive rehabilitation regime.: Yes Daily analysis of laboratory values and/or radiology reports with any subsequent need for medication adjustment of medical intervention for : Neurological problems;Other  Keiona Jenison, [name] 02/14/2015, 9:33 AM

## 2015-02-14 NOTE — Plan of Care (Signed)
Problem: RH BLADDER ELIMINATION Goal: RH STG MANAGE BLADDER WITH ASSISTANCE STG Manage Bladder With Assistance. Mod I  Outcome: Not Progressing incontinent

## 2015-02-15 ENCOUNTER — Inpatient Hospital Stay (HOSPITAL_COMMUNITY): Payer: Medicare Other | Admitting: Physical Therapy

## 2015-02-15 ENCOUNTER — Inpatient Hospital Stay (HOSPITAL_COMMUNITY): Payer: Medicare Other | Admitting: Speech Pathology

## 2015-02-15 ENCOUNTER — Inpatient Hospital Stay (HOSPITAL_COMMUNITY): Payer: Medicare Other

## 2015-02-15 ENCOUNTER — Encounter (HOSPITAL_COMMUNITY): Payer: Medicare Other

## 2015-02-15 NOTE — Progress Notes (Signed)
[facility] PHYSICAL MEDICINE & REHABILITATION     PROGRESS NOTE    Subjective/Complaints: Had a productive day yesterday. Encountered no new problems.   ROS: Pt denies fever, rash/itching, headache, blurred or double vision, nausea, vomiting, abdominal pain, diarrhea, chest pain, shortness of breath, palpitations, dysuria, dizziness, neck or back pain, bleeding, anxiety, or depression   Objective: Vital Signs: Blood pressure 117/52, pulse 74, temperature 98.1 F (36.7 C), temperature source Oral, resp. rate 19, height _0  (1.778 m), weight 72.1 kg (158 lb 15.2 oz), SpO2 97 %. No results found.  Recent Labs  02/12/15 1050 02/13/15 0546  WBC 2.6* 2.7*  HGB 13.5 12.2*  HCT 39.7 35.7*  PLT 110* 98*    Recent Labs  02/12/15 1050 02/13/15 0546  NA 138 135  K 3.5 3.6  CL 102 104  GLUCOSE 135* 93  BUN 10 11  CREATININE 0.89 0.89  CALCIUM 8.4* 8.0*   CBG (last 3)  No results for input(s): GLUCAP in the last 72 hours.  Wt Readings from Last 3 Encounters:  02/14/15 72.1 kg (158 lb 15.2 oz)  02/11/15 73.3 kg (161 lb 9.6 oz)  01/31/15 73.755 kg (162 lb 9.6 oz)    Physical Exam:  Constitutional: He is oriented to person, place, and time.  HENT:  Oral mucosa pink, moist, dentition good Eyes: EOM are normal.  Neck: Normal range of motion. Neck supple. No thyromegaly present.  Cardiovascular: Normal rate and regular rhythm. no murmurs, rubs Respiratory: Effort normal and breath sounds normal. No respiratory distress.  GI: Soft. Bowel sounds are normal. He exhibits no distension.  Neurological: He is alert and oriented to person, place, and time.  Speech is mildly slurred but intelligible. Right central 7. He is oriented to person place and date. Continued word finding deficits/occasional substitution. RUE: 4/5 prox to distal. Decreased FMC. RLE: 3+hf, 4/5 ke, adf/apf. LLE: 4+/5. Decreased proprioception in both legs (chronic)  Skin: Skin is warm and dry.   Psychiatric: He has a normal mood and affect. His behavior is normal    Assessment/Plan: 1. Functional deficits secondary to left basal ganglia infarct which require 3+ hours per day of interdisciplinary therapy in a comprehensive inpatient rehab setting. Physiatrist is providing close team supervision and 24 hour management of active medical problems listed below. Physiatrist and rehab team continue to assess barriers to discharge/monitor patient progress toward functional and medical goals. FIM: FIM - Bathing Bathing Steps Patient Completed: Chest, Right Arm, Left Arm, Abdomen, Right upper leg, Left upper leg Bathing: 3: Mod-Patient completes 5-7 25f10 parts or 50-74%  FIM - Upper Body Dressing/Undressing Upper body dressing/undressing steps patient completed: Thread/unthread right sleeve of pullover shirt/dresss, Thread/unthread left sleeve of pullover shirt/dress, Put head through opening of pull over shirt/dress, Pull shirt over trunk Upper body dressing/undressing: 5: Set-up assist to: Obtain clothing/put away FIM - Lower Body Dressing/Undressing Lower body dressing/undressing steps patient completed: Thread/unthread right underwear leg, Thread/unthread left underwear leg, Pull underwear up/down, Thread/unthread right pants leg, Thread/unthread left pants leg, Pull pants up/down, Fasten/unfasten pants, Don/Doff right sock, Don/Doff left sock, Don/Doff right shoe, Don/Doff left shoe Lower body dressing/undressing: 4: Steadying Assist  FIM - Toileting Toileting steps completed by patient: Adjust clothing after toileting, Adjust clothing prior to toileting, Performs perineal hygiene Toileting Assistive Devices: Grab bar or rail for support Toileting: 4: Steadying assist  FIM - TRadio producerDevices: WInsurance account managerTransfers: 4-To toilet/BSC: Min A (steadying Pt. > 75%), 4-From toilet/BSC: Min A (  steadying Pt. > 75%)  FIM - Bed/Chair Transfer Bed/Chair  Transfer Assistive Devices: Copy: 4: Chair or W/C > Bed: Min A (steadying Pt. > 75%)  FIM - Locomotion: Wheelchair Distance: 175 Locomotion: Wheelchair: 0: Activity did not occur FIM - Locomotion: Ambulation Locomotion: Ambulation Assistive Devices: Administrator Ambulation/Gait Assistance: 4: Min guard Locomotion: Ambulation: 4: Travels 150 ft or more with minimal assistance (Pt.>75%)  Comprehension Comprehension Mode: Auditory Comprehension: 5-Understands complex 90% of the time/Cues < 10% of the time  Expression Expression Mode: Verbal Expression: 3-Expresses basic 50 - 74% of the time/requires cueing 25 - 50% of the time. Needs to repeat parts of sentences.  Social Interaction Social Interaction: 4-Interacts appropriately 75 - 89% of the time - Needs redirection for appropriate language or to initiate interaction.  Problem Solving Problem Solving: 5-Solves basic 90% of the time/requires cueing < 10% of the time  Memory Memory: 5-Recognizes or recalls 90% of the time/requires cueing < 10% of the time  Medical Problem List and Plan: 1. Functional deficits secondary to left basal ganglia infarct 2. DVT Prophylaxis/Anticoagulation: Fixed dose of Coumadin while maintained on Revlimid to continue 3. Pain Management: Robaxin as needed-sx controlled 4. Dysphagia. Dysphagia #3 nectar liquids. Continue current diet 5. Neuropsych: This patient is capable of making decisions on his own behalf. 6. Skin/Wound Care: Routine skin checks 7. Fluids/Electrolytes/Nutrition: Routine I&O. I personally reviewed the patient's labs today and normal 8. History of multiple myeloma/spinal metastasis. Chemotherapy ongoing per Dr. Gwenyth Allegra weekly on Wednesdays  -counts stable  9. Aspiration pneumonia. Unasyn changed to Augmentin 02/12/2015 through 7/15. Chest clear on exam 10. BPH. Check PVRs 3. Continue Flomax 11. Diarrhea: resolved  LOS (Days) 3 A FACE TO FACE  EVALUATION WAS PERFORMED  Zanita Millman T 02/15/2015 7:47 AM

## 2015-02-15 NOTE — Progress Notes (Signed)
Physical Therapy Session Note  Patient Details  Name: Jeremiah Owens MRN: [id number] Date of Birth: May 29, 1930  Today's Date: 02/15/2015 PT Individual Time: 0800-0900 PT Individual Time Calculation (min): 60 min   Short Term Goals: Week 1:  PT Short Term Goal 1 (Week 1): Will perform bed mobility modI with no cues needed for safety PT Short Term Goal 2 (Week 1): Will perform bed <> w/c transfer with CGA and min cues for safety PT Short Term Goal 3 (Week 1): Will perform w/c propulsion with supervision >241ft PT Short Term Goal 4 (Week 1): Will perform up/down 12 stairs with CGA and min cues needed for hand placement PT Short Term Goal 5 (Week 1): Will perfom gait with RW x175 with supervision  Skilled Therapeutic Interventions/Progress Updates:  Session focused on gait training, stairs, and endurance. Pt received sitting at EOB. Setup to obtain clothing for UB dressing and LB dressing with sit>stand with supervision. Toilet transfer with RW and grab bars and managed clothing and performed hygiene with supervision. Pt ambulated 192 ft with RW with supervision. Pt negotiated up/down twelve 6 inch steps using both handrails requiring supervision. Gait training with RW to facilitate bilateral foot clearance stepping over low height objects progressed to 4 inch objects close supervision with mod cuing and demonstration for sequencing and RW management. Pt performed Nu step X 10 min level 5 BLE/BUE for activity endurance. Pt stated plan to use rollator upon discharge home. Trialed rollator for transfers, brake management, and ambulation X 192 ft with appropriate carryover therefore plan to continue use of rollator. Pt left in room sitting in recliner with quick release belt and all needs in reach.  Therapy Documentation Precautions:  Precautions Precautions: Fall Restrictions Weight Bearing Restrictions: No  Pain: Pain Assessment Pain Assessment: No/denies pain  See FIM for current functional  status  Therapy/Group: Individual Therapy  Elsie Ra 02/15/2015, 12:11 PM

## 2015-02-15 NOTE — Progress Notes (Signed)
Speech Language Pathology Daily Session Note  Patient Details  Name: Jeremiah Owens MRN: [id number] Date of Birth: 05-08-1930  Today's Date: 02/15/2015 SLP Individual Time: 1330-1430 SLP Individual Time Calculation (min): 60 min  Short Term Goals: Week 1: SLP Short Term Goal 1 (Week 1): short term goals = long term goals due to length of stay  Skilled Therapeutic Interventions: Skilled treatment session focused on word-finding goals. Upon arrival, patient was sitting upright in recliner and was transferred to the wheelchair. SLP facilitated session by providing Min A verbal and question cues for patient to self-monitor and correct errors during a basic verbal expression task. Patient identified pictures of functional and familiar objects with 90% accuracy and completed verbal description task at the phrase level in regards to comparing/contrasting items with Min A multimodal cues and extra time. Patient left upright in recliner with quick release belt in place and all needs within reach. Continue with current plan of care.   FIM:  Comprehension Comprehension Mode: Auditory Comprehension: 6-Follows complex conversation/direction: With extra time/assistive device Expression Expression Mode: Verbal Expression: 4-Expresses basic 75 - 89% of the time/requires cueing 10 - 24% of the time. Needs helper to occlude trach/needs to repeat words. Social Interaction Social Interaction: 6-Interacts appropriately with others with medication or extra time (anti-anxiety, antidepressant). Problem Solving Problem Solving: 5-Solves basic 90% of the time/requires cueing < 10% of the time Memory Memory: 5-Recognizes or recalls 90% of the time/requires cueing < 10% of the time FIM - Eating Eating Activity: 6: Swallowing techniques: self-managed;6: Modified consistency diet: (comment)  Pain Pain Assessment Pain Assessment: No/denies pain  Therapy/Group: Individual Therapy  Jeremiah Owens 02/15/2015, 5:07  PM

## 2015-02-15 NOTE — Discharge Instructions (Addendum)
Information on my medicine - Coumadin   (Warfarin)  This medication education was reviewed with me or my healthcare representative as part of my discharge preparation.   Why was Coumadin prescribed for you? Coumadin was prescribed for you because you have a blood clot or a medical condition that can cause an increased risk of forming blood clots. Blood clots can cause serious health problems by blocking the flow of blood to the heart, lung, or brain. Coumadin can prevent harmful blood clots from forming. As a reminder your indication for Coumadin is: blood clot prevention while on Revlimid     What test will check on my response to Coumadin? While on Coumadin (warfarin) you will need to have an INR test regularly to ensure that your dose is keeping you in the desired range. The INR (international normalized ratio) number is calculated from the result of the laboratory test called prothrombin time (PT).  If an INR APPOINTMENT HAS NOT ALREADY BEEN MADE FOR YOU please schedule an appointment to have this lab work done by your health care provider within 7 days.  Ask your health care provider during an office visit what your goal INR is.  What  do you need to  know  About  COUMADIN? Take Coumadin (warfarin) exactly as prescribed by your healthcare provider about the same time each day.  DO NOT stop taking without talking to the doctor who prescribed the medication.  Stopping without other blood clot prevention medication to take the place of Coumadin may increase your risk of developing a new clot or stroke.  Get refills before you run out.  What do you do if you miss a dose? If you miss a dose, take it as soon as you remember on the same day then continue your regularly scheduled regimen the next day.  Do not take two doses of Coumadin at the same time.  Important Safety Information A possible side effect of Coumadin (Warfarin) is an increased risk of bleeding. You should call your healthcare  provider right away if you experience any of the following: ? Bleeding from an injury or your nose that does not stop. ? Unusual colored urine (red or dark brown) or unusual colored stools (red or black). ? Unusual bruising for unknown reasons. ? A serious fall or if you hit your head (even if there is no bleeding).  Some foods or medicines interact with Coumadin (warfarin) and might alter your response to warfarin. To help avoid this: ? Eat a balanced diet, maintaining a consistent amount of Vitamin K. ? Notify your provider about major diet changes you plan to make. ? Avoid alcohol or limit your intake to 1 drink for women and 2 drinks for men per day. (1 drink is 5 oz. wine, 12 oz. beer, or 1.5 oz. liquor.)  Make sure that ANY health care provider who prescribes medication for you knows that you are taking Coumadin (warfarin).  Also make sure the healthcare provider who is monitoring your Coumadin knows when you have started a new medication including herbals and non-prescription products.  Coumadin (Warfarin)  Major Drug Interactions  Increased Warfarin Effect Decreased Warfarin Effect  Alcohol (large quantities) Antibiotics (esp. Septra/Bactrim, Flagyl, Cipro) Amiodarone (Cordarone) Aspirin (ASA) Cimetidine (Tagamet) Megestrol (Megace) NSAIDs (ibuprofen, naproxen, etc.) Piroxicam (Feldene) Propafenone (Rythmol SR) Propranolol (Inderal) Isoniazid (INH) Posaconazole (Noxafil) Barbiturates (Phenobarbital) Carbamazepine (Tegretol) Chlordiazepoxide (Librium) Cholestyramine (Questran) Griseofulvin Oral Contraceptives Rifampin Sucralfate (Carafate) Vitamin K   Coumadin (Warfarin) Major Herbal Interactions  Increased Warfarin  Effect Decreased Warfarin Effect  Garlic Ginseng Ginkgo biloba Coenzyme Q10 Green tea St. Johns wort    Coumadin (Warfarin) FOOD Interactions  Eat a consistent number of servings per week of foods HIGH in Vitamin K (1 serving =  cup)  Collards  (cooked, or boiled & drained) Kale (cooked, or boiled & drained) Mustard greens (cooked, or boiled & drained) Parsley *serving size only =  cup Spinach (cooked, or boiled & drained) Swiss chard (cooked, or boiled & drained) Turnip greens (cooked, or boiled & drained)  Eat a consistent number of servings per week of foods MEDIUM-HIGH in Vitamin K (1 serving = 1 cup)  Asparagus (cooked, or boiled & drained) Broccoli (cooked, boiled & drained, or raw & chopped) Brussel sprouts (cooked, or boiled & drained) *serving size only =  cup Lettuce, raw (green leaf, endive, romaine) Spinach, raw Turnip greens, raw & chopped   These websites have more information on Coumadin (warfarin):  FailFactory.se; VeganReport.com.au;   Inpatient Rehab Discharge Instructions  Jeremiah Owens Discharge date and time: No discharge date for patient encounter.   Activities/Precautions/ Functional Status: Activity: activity as tolerated Diet:  Wound Care: none needed Functional status:  ___ No restrictions     ___ Walk up steps independently ___ 24/7 supervision/assistance   ___ Walk up steps with assistance ___ Intermittent supervision/assistance  ___ Bathe/dress independently ___ Walk with walker     ___ Bathe/dress with assistance ___ Walk Independently    ___ Shower independently _x__ Walk with assistance    ___ Shower with assistance ___ No alcohol     ___ Return to work/school ________  COMMUNITY REFERRALS UPON DISCHARGE:   Outpatient: PT     OT     ST  Agency:  [place] Phone:  [phone]  Appointment Date/Time:  02-23-15   8:00 am - Speech Therapy                                                      02-26-15  11:25 am - Physical Therapy                                                                     12:30 pm - Occupational Therapy Medical Equipment/Items Ordered:  None needed, as you have all recommended equipment already.   GENERAL  COMMUNITY RESOURCES FOR PATIENT/FAMILY: Support Groups:  [facility] Stroke Support Group                               Meets the 3rd Sunday of every month (except June, July, August) at 3 PM                              In the dayroom on [address] at [facility]  Special Instructions:  Follow-up Wednesday, 02/22/2015 with Dr. Julien Nordmann for infusion of VELCADE    Follow-up with oncology services on ongoing Revlimid and Decadron and Velcade STROKE/TIA DISCHARGE INSTRUCTIONS SMOKING Cigarette smoking  nearly doubles your risk of having a stroke & is the single most alterable risk factor  If you smoke or have smoked in the last 12 months, you are advised to quit smoking for your health.  Most of the excess cardiovascular risk related to smoking disappears within a year of stopping.  Ask you doctor about anti-smoking medications  [place] Quit Line: 1-800-QUIT NOW  Free Smoking Cessation Classes [phone]  CHOLESTEROL Know your levels; limit fat & cholesterol in your diet  Lipid Panel     Component Value Date/Time   CHOL 165 02/11/2015 1254   TRIG 96 02/11/2015 1254   HDL 70 02/11/2015 1254   CHOLHDL 2.4 02/11/2015 1254   VLDL 19 02/11/2015 1254   LDLCALC 76 02/11/2015 1254      Many patients benefit from treatment even if their cholesterol is at goal.  Goal: Total Cholesterol (CHOL) less than 160  Goal:  Triglycerides (TRIG) less than 150  Goal:  HDL greater than 40  Goal:  LDL (LDLCALC) less than 100   BLOOD PRESSURE American Stroke Association blood pressure target is less that 120/80 mm/Hg  Your discharge blood pressure is:  BP: (!) 111/48 mmHg (pt asymptomatic)  Monitor your blood pressure  Limit your salt and alcohol intake  Many individuals will require more than one medication for high blood pressure  DIABETES (A1c is a blood sugar average for last 3 months) Goal HGBA1c is under 7% (HBGA1c is blood sugar average for last 3  months)  Diabetes: No known diagnosis of diabetes    Lab Results  Component Value Date   HGBA1C 5.7* 02/11/2015     Your HGBA1c can be lowered with medications, healthy diet, and exercise.  Check your blood sugar as directed by your physician  Call your physician if you experience unexplained or low blood sugars.  PHYSICAL ACTIVITY/REHABILITATION Goal is 30 minutes at least 4 days per week  Activity: Increase activity slowly, Therapies: Physical Therapy: Home Health Return to work:   Activity decreases your risk of heart attack and stroke and makes your heart stronger.  It helps control your weight and blood pressure; helps you relax and can improve your mood.  Participate in a regular exercise program.  Talk with your doctor about the best form of exercise for you (dancing, walking, swimming, cycling).  DIET/WEIGHT Goal is to maintain a healthy weight  Your discharge diet is: DIET DYS 3 Room service appropriate?: Yes; Fluid consistency:: Nectar Thick  liquids Your height is:  Height: 5\' 10"  (177.8 cm) Your current weight is: Weight: 71.6 kg (157 lb 13.6 oz) Your Body Mass Index (BMI) is:  BMI (Calculated): 22.4  Following the type of diet specifically designed for you will help prevent another stroke.  Your goal weight range is:    Your goal Body Mass Index (BMI) is 19-24.  Healthy food habits can help reduce 3 risk factors for stroke:  High cholesterol, hypertension, and excess weight.  RESOURCES Stroke/Support Group:  Call [phone]   STROKE EDUCATION PROVIDED/REVIEWED AND GIVEN TO PATIENT Stroke warning signs and symptoms How to activate emergency medical system (call 911). Medications prescribed at discharge. Need for follow-up after discharge. Personal risk factors for stroke. Pneumonia vaccine given:  Flu vaccine given:  My questions have been answered, the writing is legible, and I understand these instructions.  I will adhere to these goals & educational  materials that have been provided to me after my discharge from the hospital.  My questions have been answered and I understand these instructions. I will adhere to these goals and the provided educational materials after my discharge from the hospital.  Patient/Caregiver Signature _______________________________ Date __________  Clinician Signature _______________________________________ Date __________  Please bring this form and your medication list with you to all your follow-up doctor's appointments.

## 2015-02-15 NOTE — Progress Notes (Signed)
Social Work Patient ID: Jeremiah Owens, male   DOB: September 12, 1929, 81 y.o.   MRN: [id number]   CSW met with pt to update him on team conference discussion and then spoke with pt's wife via telephone.  She is pleased with the d/c date and will be ready to care for him at home.  She prefers to take pt for outpt rehab and PT agrees with this plan.  CSW will set this up for pt next week. Pt is looking forward to going home.  Pt has all needed DME.  CSW will continue to follow and assist as needed.

## 2015-02-15 NOTE — IPOC Note (Signed)
Overall Plan of Care [facility]) Patient Details Name: Jeremiah Owens MRN: [id number] DOB: 06/20/1930  Admitting Diagnosis: CVA  Hospital Problems: Principal Problem:   Basal ganglia infarction Active Problems:   Multiple myeloma   Aspiration pneumonia   Aphasia     Functional Problem List: Nursing Endurance  PT Balance, Endurance, Motor, Sensory, Safety  OT Balance, Cognition, Endurance, Motor, Safety  SLP Linguistic, Nutrition  TR         Basic ADL's: OT Eating, Grooming, Bathing, Dressing, Toileting     Advanced  ADL's: OT       Transfers: PT Bed Mobility, Bed to Chair, Car, Manufacturing systems engineer, Metallurgist: PT Ambulation, Emergency planning/management officer, Stairs     Additional Impairments: OT Fuctional Use of Upper Extremity  SLP Swallowing, Communication expression    TR      Anticipated Outcomes Item Anticipated Outcome  Self Feeding Mod I  Swallowing  Mod I    Basic self-care  Supervision  Toileting  Mod I   Bathroom Transfers Mod I  Bowel/Bladder  Mod I  Transfers  supervision  Locomotion  supervision with RW  Communication  Min A  Cognition     Pain  <3  Safety/Judgment  Mod I   Therapy Plan: PT Intensity: Minimum of 1-2 x/day ,45 to 90 minutes PT Frequency: 5 out of 7 days PT Duration Estimated Length of Stay: 7-10 days OT Intensity: Minimum of 1-2 x/day, 45 to 90 minutes OT Frequency: 5 out of 7 days OT Duration/Estimated Length of Stay: 7-10 days SLP Intensity: Minumum of 1-2 x/day, 30 to 90 minutes SLP Frequency: 3 to 5 out of 7 days SLP Duration/Estimated Length of Stay: 7-10 days       Team Interventions: Nursing Interventions Disease Management/Prevention, Discharge Planning  PT interventions Ambulation/gait training, Training and development officer, Neuromuscular re-education, Patient/family education, Stair training, Therapeutic Exercise, UE/LE Coordination activities, UE/LE Strength taining/ROM, Wheelchair  propulsion/positioning, Therapeutic Activities, Functional mobility training, Discharge planning, Community reintegration, Psychosocial support  OT Interventions Training and development officer, Cognitive remediation/compensation, Discharge planning, Disease mangement/prevention, DME/adaptive equipment instruction, Functional mobility training, Neuromuscular re-education, Patient/family education, Pain management, Psychosocial support, Self Care/advanced ADL retraining, Therapeutic Activities, Therapeutic Exercise, UE/LE Coordination activities, UE/LE Strength taining/ROM  SLP Interventions Cueing hierarchy, Dysphagia/aspiration precaution training, Environmental controls, Functional tasks, Internal/external aids, Multimodal communication approach, Patient/family education, Speech/Language facilitation  TR Interventions    SW/CM Interventions Discharge Planning, Psychosocial Support, Patient/Family Education    Team Discharge Planning: Destination: PT-Home ,OT- Home , SLP-Home Projected Follow-up: PT-Home health PT, OT-  24 hour supervision/assistance (follow up TBD), SLP-Home Health SLP, 24 hour supervision/assistance Projected Equipment Needs: PT-Other (comment), To be determined (Pt reports having RW, w/c, cane already), OT-  , SLP-Other (comment) (thickener) Equipment Details: PT- , OT-  Patient/family involved in discharge planning: PT- Patient,  OT-Patient, SLP-Patient  MD ELOS: 7-9d Medical Rehab Prognosis:  Good Assessment: 81 y.o. right handed male with history of multiple myeloma on chemotherapy on Revlimid, radiation therapy for spinal metastasis 08/21/2014-09/04/2014 as well as 11/29/2014-12/12/2014 per Dr. Lisbeth Renshaw and Dr. Earlie Server. Chronic Fixed dose Coumadin while maintained on Revlimid Well known to rehabilitation services from admission November 2015 for thoracic spine decompression and fusion related to multiple myeloma. Patient lives with his wife used a rolling walker to ambulate short  community distances prior to admission. Presented 02/11/2015 with right sided weakness and slurred speech 2 days. MRI of the brain showed acute left basal ganglia lacunar infarction    Now requiring 24/7 Rehab  RN,MD, as well as CIR level PT, OT and SLP.  Treatment team will focus on ADLs and mobility with goals set at Mod I  See Team Conference Notes for weekly updates to the plan of care

## 2015-02-15 NOTE — Progress Notes (Signed)
Occupational Therapy Session Note  Patient Details  Name: Jeremiah Owens MRN: [id number] Date of Birth: [birth year]  Today's Date: 02/15/2015 OT Individual Time: 1100-1200 OT Individual Time Calculation (min): 60 min    Short Term Goals: Week 1:  OT Short Term Goal 1 (Week 1): STG = LTGs due to ELOS  Skilled Therapeutic Interventions/Progress Updates:    Pt seen for 1:1 OT session with a focus on ADL retraining, functional ambulation, dynamic standing balance, sit<>stand, activity tolerance, and safety awareness. Pt received seated in recliner agreeable to participate in ADL session. Pt completed sit<>stand and ambulated to bathroom via rollator with steadying A. Pt completed shower transfer with supervision and requesting to complete LB showering in standing. Pt required steadying A for showering particularly when completing peri/buttocks hygiene and close supervision when seated to complete UB. Pt then requesting to toilet and completed toileting routine with steadying A for safety during hygiene. Pt completed dressing sit<>stand with UB dressing at supervision level and LB dressing with steadying A during clothing management due to posterior sway in standing. Pt engaged in grooming tasks at the sink in standing for apprx 6 minutes without LOB. Pt ambulated via rollator with supervision 50' x2. Pt left seated in recliner with all needs in reach.   Therapy Documentation Precautions:  Precautions Precautions: Fall Restrictions Weight Bearing Restrictions: No General:   Vital Signs:  Pain: Pain Assessment Pain Assessment: No/denies pain ADL:   Exercises:   Other Treatments:    See FIM for current functional status  Therapy/Group: Individual Therapy  Dorann Ou 02/15/2015, 11:38 AM

## 2015-02-16 ENCOUNTER — Inpatient Hospital Stay (HOSPITAL_COMMUNITY): Payer: Medicare Other | Admitting: Speech Pathology

## 2015-02-16 ENCOUNTER — Inpatient Hospital Stay (HOSPITAL_COMMUNITY): Payer: Medicare Other | Admitting: *Deleted

## 2015-02-16 ENCOUNTER — Inpatient Hospital Stay (HOSPITAL_COMMUNITY): Payer: Medicare Other

## 2015-02-16 NOTE — Progress Notes (Signed)
Physical Therapy Session Note  Patient Details  Name: Jeremiah Owens MRN: [id number] Date of Birth: [birth year]  Today's Date: 02/16/2015 PT Individual Time: 1510-1535 PT Individual Time Calculation (min): 25 min   Short Term Goals: Week 1:  PT Short Term Goal 1 (Week 1): Will perform bed mobility modI with no cues needed for safety PT Short Term Goal 2 (Week 1): Will perform bed <> w/c transfer with CGA and min cues for safety PT Short Term Goal 3 (Week 1): Will perform w/c propulsion with supervision >230ft PT Short Term Goal 4 (Week 1): Will perform up/down 12 stairs with CGA and min cues needed for hand placement PT Short Term Goal 5 (Week 1): Will perfom gait with RW x175 with supervision  Skilled Therapeutic Interventions/Progress Updates:  Tx focused on functional mobility training, gait with 4WW, and NMR via forced use, manual facilitation, and multi-modal cues during reaching/placing task.   Pt performed toilet transfer with 4WW and close S with safety cues. Sit<.stand transfers in gym with close S as well, pt tends to lean posteriorly. Performed seated self HS stretch 2x98min each bil with cues for technique and relationship to balance.   Standing balance tasks for reaching across midline to place horseshoes on basketball rim. First trial on firm and then on foam to increase proprioceptive challenge. Pt has difficulty maintaining upright posture. Pt needed seated rest break between trials with cues for controlling descent.   Gait in controlled setting 2x150' with S and cues for bil foot clearance and safety during turns. Safe brake use noted during transitions.   Lap belt applied, pt up in recliner.      Therapy Documentation Precautions:  Precautions Precautions: Fall Restrictions Weight Bearing Restrictions: No General:   Vital Signs: Therapy Vitals Temp: 98.8 F (37.1 C) Temp Source: Oral Pulse Rate: 72 Resp: 16 BP: (!) 116/45 mmHg Patient Position (if  appropriate): Sitting Oxygen Therapy SpO2: 99 % O2 Device: Not Delivered Pain: Pain Assessment Pain Assessment: No/denies pain  See FIM for current functional status  Therapy/Group: Individual Therapy   Kennieth Rad, PT, DPT  02/16/2015, 2:32 PM

## 2015-02-16 NOTE — Progress Notes (Signed)
Occupational Therapy Session Note  Patient Details  Name: [name] MRN: [id number] Date of Birth: 10/14/1929  Today's Date: 02/16/2015 OT Individual Time: 0900-1000 OT Individual Time Calculation (min): 60 min    Short Term Goals: Week 1:  OT Short Term Goal 1 (Week 1): STG = LTGs due to ELOS  Skilled Therapeutic Interventions/Progress Updates:    Pt resting in recliner upon arrival.  Pt already dressed and stated he only needed to shave and brush his teeth.  Pt stood at sink to complete grooming tasks.  Pt amb with Rollator to therapy gym and engaged in dynamic standing tasks on noncompliant surface.  Pt also engaged in functional amb with Rollator for home mgmt tasks including reaching outside BOS and retrieving items from floor using reacher.  Pt required multiple rest breaks throughout session.  Focus on activity tolerance, sit<>stand, standing balance, functional amb, and safety awareness.   Therapy Documentation Precautions:  Precautions Precautions: Fall Restrictions Weight Bearing Restrictions: No   Pain: Pain Assessment Pain Assessment: No/denies pain  See FIM for current functional status  Therapy/Group: Individual Therapy  Leroy Libman 02/16/2015, 10:02 AM

## 2015-02-16 NOTE — Plan of Care (Signed)
Problem: RH BOWEL ELIMINATION Goal: RH STG MANAGE BOWEL WITH ASSISTANCE STG Manage Bowel with Assistance. Mod I  Outcome: Not Progressing Incontinent  Problem: RH BLADDER ELIMINATION Goal: RH STG MANAGE BLADDER WITH ASSISTANCE STG Manage Bladder With Assistance. Mod I  Outcome: Not Progressing Incontinent

## 2015-02-16 NOTE — Progress Notes (Signed)
Physical Therapy Session Note  Patient Details  Name: Jeremiah Owens MRN: [id number] Date of Birth: April 26, 1930  Today's Date: 02/16/2015 PT Individual Time: 0800-0900 PT Individual Time Calculation (min): 60 min   Short Term Goals: Week 1:  PT Short Term Goal 1 (Week 1): Will perform bed mobility modI with no cues needed for safety PT Short Term Goal 2 (Week 1): Will perform bed <> w/c transfer with CGA and min cues for safety PT Short Term Goal 3 (Week 1): Will perform w/c propulsion with supervision >257ft PT Short Term Goal 4 (Week 1): Will perform up/down 12 stairs with CGA and min cues needed for hand placement PT Short Term Goal 5 (Week 1): Will perfom gait with RW x175 with supervision  Skilled Therapeutic Interventions/Progress Updates:    Session focused on functional mobility and balance for pt don clothes at S level including sit to stands and dynamic standing balance without UE support, basic transfers at S level with cues to push up from chair/bed to decrease effort, simulated car transfer at S level with cues for safe placement of rollator to decrease fall risk, gait training on unit at S level with rollator with cues for upright posture and step length > 150', stair negotiation up/down 8 steps with rails for functional strengthening with S for safety, dynamic gait through obstacle course with rollator between obstacles and over compliant surface to simulate outdoor community mobility with close S/steady A, addressed pt's word finding difficulties during functional tasks, assisted pt with toileting needs at S level for safety and cues for hand placement, and administered 5 Time Sit to Stand (see below). Pt making good functional progress with mobility using personal rollator during this session.    Five times Sit to Stand Test (FTSS) Method: Use a straight back chair with a solid seat that is 16-18" high. Ask participant to sit on the chair with arms folded across their chest.    Instructions: "Stand up and sit down as quickly as possible 5 times, keeping your arms folded across your chest."   Measurement: Stop timing when the participant stands the 5th time.  TIME: ___23___ (in seconds) - using BUE off of mat and min A for sit to stands.  Times > 13.6 seconds is associated with increased disability and morbidity (Guralnik, 2000) Times > 15 seconds is predictive of recurrent falls in healthy individuals aged 31 and older (Buatois, et al., 2008) Normal performance values in community dwelling individuals aged 29 and older (Bohannon, 2006): o 60-69 years: 11.4 seconds o 70-79 years: 12.6 seconds o 80-89 years: 14.8 seconds  MCID: ? 2.3 seconds for Vestibular Disorders Mariah Milling, 2006)   Therapy Documentation Precautions:  Precautions Precautions: Fall Restrictions Weight Bearing Restrictions: No   Pain:  Denies pain.  See FIM for current functional status  Therapy/Group: Individual Therapy  Canary Brim Ivory Broad, PT, DPT  02/16/2015, 9:19 AM

## 2015-02-16 NOTE — Progress Notes (Signed)
Speech Language Pathology Daily Session Note  Patient Details  Name: Jeremiah Owens MRN: [id number] Date of Birth: Mar 29, 1930  Today's Date: 02/16/2015 SLP Individual Time: 1000-1100 SLP Individual Time Calculation (min): 60 min  Short Term Goals: Week 1: SLP Short Term Goal 1 (Week 1): short term goals = long term goals due to length of stay  Skilled Therapeutic Interventions: Skilled treatment session focused on dysphagia and language goals. Patient independently performed oral care and participated in trials of ice chips with head in neutral position. Patient demonstrated what appeared to be a delayed swallow initiation but utilize single swallows without overt s/s of aspiration. However, patient demonstrated a delayed throat clear X 1 which then became constant. Patient reported he felt something was in his throat and required an extended amount of time to clear suspected penetrates.  Patient ambulated to and from SLP office with rollator with supervision and participated in a convergent naming task with extra time and Min A verbal cues. Patient also participated in a basic conversation in regards to recall of events from previous therapy sessions with extra time and Min A verbal cues. Patient independently requested to use the bathroom and required supervision verbal cues for safety with task. Patient left in recliner with quick release belt in place and all needs within reach. Continue with current plan of care.    FIM:  Comprehension Comprehension Mode: Auditory Comprehension: 6-Follows complex conversation/direction: With extra time/assistive device Expression Expression Mode: Verbal Expression: 4-Expresses basic 75 - 89% of the time/requires cueing 10 - 24% of the time. Needs helper to occlude trach/needs to repeat words. Social Interaction Social Interaction: 6-Interacts appropriately with others with medication or extra time (anti-anxiety, antidepressant). Problem Solving Problem  Solving: 5-Solves basic 90% of the time/requires cueing < 10% of the time Memory Memory: 5-Recognizes or recalls 90% of the time/requires cueing < 10% of the time FIM - Eating Eating Activity: 5: Supervision/cues  Pain Pain Assessment Pain Assessment: No/denies pain  Therapy/Group: Individual Therapy  Aolanis Crispen 02/16/2015, 11:44 AM

## 2015-02-16 NOTE — Progress Notes (Signed)
Jeremiah Owens PHYSICAL MEDICINE & REHABILITATION     PROGRESS NOTE    Subjective/Complaints: Slept well. Felt that speech therapy was productive. Denies h/a  ROS: Pt denies fever, rash/itching, headache, blurred or double vision, nausea, vomiting, abdominal pain, diarrhea, chest pain, shortness of breath, palpitations, dysuria, dizziness, neck or back pain, bleeding, anxiety, or depression   Objective: Vital Signs: Blood pressure 110/60, pulse 71, temperature 98.1 F (36.7 C), temperature source Oral, resp. rate 16, height $RemoveBe'5\' 10"'IvKRzaMQM$  (1.778 m), weight 71.6 kg (157 lb 13.6 oz), SpO2 97 %. No results found. No results for input(s): WBC, HGB, HCT, PLT in the last 72 hours. No results for input(s): NA, K, CL, GLUCOSE, BUN, CREATININE, CALCIUM in the last 72 hours.  Invalid input(s): CO CBG (last 3)  No results for input(s): GLUCAP in the last 72 hours.  Wt Readings from Last 3 Encounters:  02/16/15 71.6 kg (157 lb 13.6 oz)  02/11/15 73.3 kg (161 lb 9.6 oz)  01/31/15 73.755 kg (162 lb 9.6 oz)    Physical Exam:  Constitutional: He is oriented to person, place, and time.  HENT:  Oral mucosa pink, moist, dentition intact Eyes: EOM are normal.  Neck: Normal range of motion. Neck supple. No thyromegaly present.  Cardiovascular: Normal rate and regular rhythm. no murmurs, rubs Respiratory: Effort normal and breath sounds normal. No respiratory distress.  GI: Soft. Bowel sounds are normal. He exhibits no distension.  Neurological: He is alert and oriented to person, place, and time.  Speech is mildly slurred but intelligible. Right central 7. He is oriented to person place and date. Continued word finding deficits/occasional substitution---worse that he was just waking up. RUE: 4/5 prox to distal. Decreased FMC. RLE: 3+hf, 4/5 ke, adf/apf. LLE: 4+/5. Decreased proprioception in both legs (chronic)  Skin: Skin is warm and dry.  Psychiatric: He has a normal mood and affect. His behavior is  normal    Assessment/Plan: 1. Functional deficits secondary to left basal ganglia infarct which require 3+ hours per day of interdisciplinary therapy in a comprehensive inpatient rehab setting. Physiatrist is providing close team supervision and 24 hour management of active medical problems listed below. Physiatrist and rehab team continue to assess barriers to discharge/monitor patient progress toward functional and medical goals. FIM: FIM - Bathing Bathing Steps Patient Completed: Chest, Right Arm, Left Arm, Abdomen, Right upper leg, Left upper leg, Front perineal area, Buttocks, Right lower leg (including foot), Left lower leg (including foot) Bathing: 4: Steadying assist  FIM - Upper Body Dressing/Undressing Upper body dressing/undressing steps patient completed: Thread/unthread right sleeve of pullover shirt/dresss, Pull shirt over trunk, Thread/unthread left sleeve of pullover shirt/dress, Put head through opening of pull over shirt/dress, Button/unbutton shirt Upper body dressing/undressing: 5: Set-up assist to: Obtain clothing/put away FIM - Lower Body Dressing/Undressing Lower body dressing/undressing steps patient completed: Pull pants up/down, Fasten/unfasten pants, Don/Doff right sock, Don/Doff left sock, Don/Doff right shoe, Don/Doff left shoe, Thread/unthread left underwear leg, Thread/unthread right underwear leg, Pull underwear up/down, Thread/unthread left pants leg, Thread/unthread right pants leg Lower body dressing/undressing: 4: Steadying Assist  FIM - Toileting Toileting steps completed by patient: Adjust clothing prior to toileting, Performs perineal hygiene, Adjust clothing after toileting Toileting Assistive Devices: Grab bar or rail for support Toileting: 4: Steadying assist  FIM - Radio producer Devices: Insurance account manager Transfers: 4-To toilet/BSC: Min A (steadying Pt. > 75%), 4-From toilet/BSC: Min A (steadying Pt. > 75%)  FIM -  Control and instrumentation engineer Devices: Environmental consultant  Bed/Chair Transfer: 5: Bed > Chair or W/C: Supervision (verbal cues/safety issues), 5: Chair or W/C > Bed: Supervision (verbal cues/safety issues)  FIM - Locomotion: Wheelchair Distance: 175 Locomotion: Wheelchair: 0: Activity did not occur FIM - Locomotion: Ambulation Locomotion: Ambulation Assistive Devices: Administrator Ambulation/Gait Assistance: 5: Supervision Locomotion: Ambulation: 5: Travels 150 ft or more with supervision/safety issues  Comprehension Comprehension Mode: Auditory Comprehension: 6-Follows complex conversation/direction: With extra time/assistive device  Expression Expression Mode: Verbal Expression: 5-Expresses basic 90% of the time/requires cueing < 10% of the time.  Social Interaction Social Interaction: 6-Interacts appropriately with others with medication or extra time (anti-anxiety, antidepressant).  Problem Solving Problem Solving: 5-Solves basic 90% of the time/requires cueing < 10% of the time  Memory Memory: 5-Recognizes or recalls 90% of the time/requires cueing < 10% of the time  Medical Problem List and Plan: 1. Functional deficits secondary to left basal ganglia infarct 2. DVT Prophylaxis/Anticoagulation: Fixed dose of Coumadin while maintained on Revlimid---no change 3. Pain Management: Robaxin as needed-sx controlled currently 4. Dysphagia. Dysphagia #3 nectar liquids. Encourage fluids 5. Neuropsych: This patient is capable of making decisions on his own behalf. 6. Skin/Wound Care: Routine skin checks 7. Fluids/Electrolytes/Nutrition: will re-check bmet on Monday given his nectar liquids  8. History of multiple myeloma/spinal metastasis. Chemotherapy ongoing per Dr. Gwenyth Owens weekly on Wednesdays  -counts stable  9. Aspiration pneumonia. DC augmentin today.  No signs of infection 10. BPH.  . Continue Flomax 11. Diarrhea: resolved  LOS (Days) 4 A FACE TO FACE  EVALUATION WAS PERFORMED  Jeremiah Owens T 02/16/2015 8:00 AM

## 2015-02-17 ENCOUNTER — Inpatient Hospital Stay (HOSPITAL_COMMUNITY): Payer: Medicare Other | Admitting: Speech Pathology

## 2015-02-17 ENCOUNTER — Inpatient Hospital Stay (HOSPITAL_COMMUNITY): Payer: Medicare Other | Admitting: *Deleted

## 2015-02-17 ENCOUNTER — Inpatient Hospital Stay (HOSPITAL_COMMUNITY): Payer: Medicare Other | Admitting: Physical Therapy

## 2015-02-17 ENCOUNTER — Inpatient Hospital Stay (HOSPITAL_COMMUNITY): Payer: Medicare Other

## 2015-02-17 NOTE — Progress Notes (Signed)
Jeremiah Owens is a 81 y.o. male [birth year] [id number]  Subjective: No new complaints. No new problems. Slept well.  Objective: Vital signs in last 24 hours: Temp:  [98.8 F (37.1 C)] 98.8 F (37.1 C) (07/16 0604) Pulse Rate:  [71-72] 71 (07/16 0604) Resp:  [16-18] 18 (07/16 0604) BP: (110-116)/(44-45) 110/44 mmHg (07/16 0604) SpO2:  [97 %-99 %] 97 % (07/16 0604) Weight change:  Last BM Date: 02/16/15  Intake/Output from previous day: 07/15 0701 - 07/16 0700 In: 480 [P.O.:480] Out: -  Last cbgs: CBG (last 3)  No results for input(s): GLUCAP in the last 72 hours.   Physical Exam General: No apparent distress. Eating breakfast HEENT: not dry Lungs: Normal effort. Lungs clear to auscultation, no crackles or wheezes. Cardiovascular: Regular rate and rhythm, no edema Abdomen: S/NT/ND; BS(+) Musculoskeletal:  unchanged Neurological: No new neurological deficits Wounds: N/A    Skin: clear  Aging changes Mental state: Alert, oriented, cooperative    Lab Results: BMET    Component Value Date/Time   NA 135 02/13/2015 0546   NA 140 02/07/2015 1009   K 3.6 02/13/2015 0546   K 4.1 02/07/2015 1009   CL 104 02/13/2015 0546   CO2 27 02/13/2015 0546   CO2 29 02/07/2015 1009   GLUCOSE 93 02/13/2015 0546   GLUCOSE 91 02/07/2015 1009   BUN 11 02/13/2015 0546   BUN 15.2 02/07/2015 1009   CREATININE 0.89 02/13/2015 0546   CREATININE 0.9 02/07/2015 1009   CALCIUM 8.0* 02/13/2015 0546   CALCIUM 9.2 02/07/2015 1009   GFRNONAA >60 02/13/2015 0546   GFRAA >60 02/13/2015 0546   CBC    Component Value Date/Time   WBC 2.7* 02/13/2015 0546   WBC 3.1* 02/07/2015 1009   RBC 3.88* 02/13/2015 0546   RBC 3.82* 02/07/2015 1009   HGB 12.2* 02/13/2015 0546   HGB 12.3* 02/07/2015 1009   HCT 35.7* 02/13/2015 0546   HCT 36.3* 02/07/2015 1009   PLT 98* 02/13/2015 0546   PLT 135* 02/07/2015 1009   MCV 92.0 02/13/2015 0546   MCV 95.2 02/07/2015 1009   MCH 31.4 02/13/2015 0546   MCH  32.3 02/07/2015 1009   MCHC 34.2 02/13/2015 0546   MCHC 33.9 02/07/2015 1009   RDW 16.1* 02/13/2015 0546   RDW 17.1* 02/07/2015 1009   LYMPHSABS 0.4* 02/13/2015 0546   LYMPHSABS 0.3* 02/07/2015 1009   MONOABS 0.3 02/13/2015 0546   MONOABS 0.1 02/07/2015 1009   EOSABS 0.2 02/13/2015 0546   EOSABS 0.0 02/07/2015 1009   BASOSABS 0.0 02/13/2015 0546   BASOSABS 0.0 02/07/2015 1009    Studies/Results: No results found.  Medications: I have reviewed the patient's current medications.  Assessment/Plan:   1. Functional deficits secondary to left basal ganglia infarct 2. DVT Prophylaxis/Anticoagulation: Fixed dose of Coumadin while maintained on Revlimid---no change 3. Pain Management: Robaxin as needed-sx controlled currently 4. Dysphagia. Dysphagia #3 nectar liquids. Encourage fluids 5. Neuropsych: This patient is capable of making decisions on his own behalf. 6. Skin/Wound Care: Routine skin checks 7. Fluids/Electrolytes/Nutrition: will re-check bmet on Monday given his nectar liquids  8. History of multiple myeloma/spinal metastasis. Chemotherapy ongoing per Dr. Gwenyth Allegra weekly on Wednesdays -counts stable  9. Aspiration pneumonia. DC augmentin today. No signs of infection 10. BPH. . Continue Flomax 11. Diarrhea: resolved   Length of stay, days: 5  Walker Kehr , MD 02/17/2015, 10:22 AM

## 2015-02-17 NOTE — Progress Notes (Signed)
Speech Language Pathology Daily Session Note  Patient Details  Name: Jeremiah Owens MRN: [id number] Date of Birth: 05/01/1930  Today's Date: 02/17/2015 SLP Individual Time: 1430-1530 SLP Individual Time Calculation (min): 60 min  Short Term Goals: Week 1: SLP Short Term Goal 1 (Week 1): short term goals = long term goals due to length of stay  Skilled Therapeutic Interventions: Skilled treatment session focused on addressing dysphagia and language goals. Patient consumed trials of ice chips with head neutral position which resulted in single swallows and no overt s/s of aspiration. SLP advanced trails to thin liquid cup sips and patient self-feed large sip which resulted in multiple swallows (5) with wet vocal quality and throat clears.  SLP then provided Mod verbal cues to consume small sips which resulted in 2-3 swallows per sip with intermittent throat clears.  Right head turn maneuver was also attempted with similar results to head neutral positioning.  SLP educatd patient on findings and recommendations regarding continued need for nectar-thick liquids until an objective swallow study is repeated.  Patient participated in the conversation with Mod cues to utilize word finding strategies when asking appropriate questions.  SLP answered questions and directed medication centered questions to RN.   Continue with current plan of care for now with repeat objective swallow assessment early next week.    FIM:  Comprehension Comprehension Mode: Auditory Comprehension: 6-Follows complex conversation/direction: With extra time/assistive device Expression Expression Mode: Verbal Expression: 4-Expresses basic 75 - 89% of the time/requires cueing 10 - 24% of the time. Needs helper to occlude trach/needs to repeat words. Social Interaction Social Interaction: 6-Interacts appropriately with others with medication or extra time (anti-anxiety, antidepressant). Problem Solving Problem Solving: 5-Solves  basic 90% of the time/requires cueing < 10% of the time Memory Memory: 5-Recognizes or recalls 90% of the time/requires cueing < 10% of the time FIM - Eating Eating Activity: 5: Supervision/cues  Pain Pain Assessment Pain Assessment: No/denies pain  Therapy/Group: Individual Therapy  Carmelia Roller., [place] [phone]  [place] 02/17/2015, 3:14 PM

## 2015-02-17 NOTE — Progress Notes (Signed)
Occupational Therapy Session Note  Patient Details  Name: Jeremiah Owens MRN: [id number] Date of Birth: March 31, 1930  Today's Date: 02/17/2015 OT Individual Time: 0900-1000 OT Individual Time Calculation (min): 60 min    Short Term Goals: Week 1:  OT Short Term Goal 1 (Week 1): STG = LTGs due to ELOS  Skilled Therapeutic Interventions/Progress Updates:    Pt seen for 1:1 OT session with a focus on ADL retraining, functional mobility, functional transfers, dynamic standing balance, activity tolerance, and safety awareness. Pt received seated in recliner agreeable to participate in ADL session. Pt completed initial sit>stand with min A and then ambulated via rollator to bathroom with close supervision to complete shower transfer. Pt completed bathing sit<>stand from shower seat with close supervision and pt utilization of grab bars. Pt then requesting to toilet and completing toileting with steadying A during hygiene in standing. Pt completed grooming in standing at sink for apprx 7 minutes with close supervision. Pt completed dressing sit<>stand from recliner with supervision and steadying A during LB clothing management. Pt referring to don belt & LB clothing in standing, requiring steadying A to do so due to posterior sway. Pt ambulated via rollator apprx 100' x2 with supervision. Pt left seated in recliner with QRB donned and all other needs within reach.    Therapy Documentation Precautions:  Precautions Precautions: Fall Restrictions Weight Bearing Restrictions: No General:   Vital Signs: Therapy Vitals Temp: 98.8 F (37.1 C) Temp Source: Oral Pulse Rate: 71 Resp: 18 BP: (!) 110/44 mmHg Patient Position (if appropriate): Lying Oxygen Therapy SpO2: 97 % O2 Device: Not Delivered Pain:   ADL:   Exercises:   Other Treatments:    See FIM for current functional status  Therapy/Group: Individual Therapy  Dorann Ou 02/17/2015, 8:24 AM

## 2015-02-17 NOTE — Progress Notes (Signed)
Physical Therapy Session Note  Patient Details  Name: Jeremiah Owens MRN: [id number] Date of Birth: [birth year]  Today's Date: 02/17/2015 PT Individual Time: 1303-1400 PT Individual Time Calculation (min): 57 min   Short Term Goals: Week 1:  PT Short Term Goal 1 (Week 1): Will perform bed mobility modI with no cues needed for safety PT Short Term Goal 2 (Week 1): Will perform bed <> w/c transfer with CGA and min cues for safety PT Short Term Goal 3 (Week 1): Will perform w/c propulsion with supervision >277ft PT Short Term Goal 4 (Week 1): Will perform up/down 12 stairs with CGA and min cues needed for hand placement PT Short Term Goal 5 (Week 1): Will perfom gait with RW x175 with supervision  Skilled Therapeutic Interventions/Progress Updates:    Pt received seated in recliner with quick release belt on; agreeable to therapy. Session focused on NMR with emphasis on static/dynamic standing without dependence on UE's. Gait training 130' x2 in controlled environment; initial trial with rollator and supervision, tactile cueing for lateral weight shift to L side; verbal/demonstration cueing for R hip/knee flexion for increased R foot clearance. Subsequent trial without AD with min A and multimodal cueing to address posterior trunk lean, limited B hip/knee extension during stance of respective LE. See below for detailed description of balance interventions. Departed pt room with pt seated in reclined with quick release belt in place for safety and all needs within reach.  Therapy Documentation Precautions:  Precautions Precautions: Fall Restrictions Weight Bearing Restrictions: No Vital Signs: Therapy Vitals Temp: 98.7 F (37.1 C) Temp Source: Oral Pulse Rate: 74 Resp: 18 BP: (!) 112/50 mmHg Patient Position (if appropriate): Sitting Oxygen Therapy SpO2: 99 % O2 Device: Not Delivered Pain: Pain Assessment Pain Assessment: No/denies pain Locomotion : Ambulation Ambulation:  Yes Ambulation/Gait Assistance: 5: Supervision;4: Min guard Ambulation Distance (Feet): 130 Feet Assistive device: Rollator Ambulation/Gait Assistance Details: Verbal cues for precautions/safety;Verbal cues for gait pattern Gait Gait: Yes Gait Pattern: Impaired Gait Pattern: Shuffle;Narrow base of support;Decreased dorsiflexion - right;Poor foot clearance - right;Trunk flexed;Step-through pattern;Decreased weight shift to left;Right flexed knee in stance;Left flexed knee in stance  Balance: Static Standing Balance Static Standing - Balance Support: No upper extremity supported Static Standing - Level of Assistance: 4: Min assist Static Standing - Comment/# of Minutes: Biodex: Postural Stability Traiining to increase pt awareness of limited weight shift to L side. Limits of stability training for multidirectional weight shifting. With LOB in all directions, B ankle strategy intact but pt consistently demonstrating posterior trunk lean in attempt to recover balance. Dynamic Standing Balance Dynamic Standing - Balance Support: No upper extremity supported Dynamic Standing - Level of Assistance: 4: Min assist;3: Mod assist Dynamic Standing - Balance Activities: Other (comment) Dynamic Standing - Comments: Standing without UE support with Tband anchored proximal to R knee for proprioceptive input, pt tapped targets on floor (anterior, lateral) with RLE for increase lateral weight shift to L side, increased R hip/knee flexion (to improve clearance during ambulation). Mirror utilized to increase pt awareness of posterior preference throughout activity. Up to Mod A required to recover from intermittent posterior LOB.. Transitioned to RUE reaching (emphasis on across midline for increased weight shift to L) for low targets to address posterior preference.  See FIM for current functional status  Therapy/Group: Individual Therapy  Feather Berrie, Malva Cogan 02/17/2015, 2:00 PM

## 2015-02-18 ENCOUNTER — Inpatient Hospital Stay (HOSPITAL_COMMUNITY): Payer: Medicare Other | Admitting: Physical Therapy

## 2015-02-18 MED ORDER — LOPERAMIDE HCL 2 MG PO CAPS
2.0000 mg | ORAL_CAPSULE | ORAL | Status: DC | PRN
  Filled 2015-02-18: qty 2

## 2015-02-18 NOTE — Progress Notes (Signed)
Physical Therapy Session Note  Patient Details  Name: Jeremiah Owens MRN: [id number] Date of Birth: 1930-07-01  Today's Date: 02/18/2015 PT Individual Time: 3220-2542 PT Individual Time Calculation (min): 30 min   Short Term Goals: Week 1:  PT Short Term Goal 1 (Week 1): Will perform bed mobility modI with no cues needed for safety PT Short Term Goal 2 (Week 1): Will perform bed <> w/c transfer with CGA and min cues for safety PT Short Term Goal 3 (Week 1): Will perform w/c propulsion with supervision >233ft PT Short Term Goal 4 (Week 1): Will perform up/down 12 stairs with CGA and min cues needed for hand placement PT Short Term Goal 5 (Week 1): Will perfom gait with RW x175 with supervision  Skilled Therapeutic Interventions/Progress Updates:  Pt was seen bedside in the am. Pt performed multiple sit to stand transfers with 4WW and S. Pt ambulated 175 feet x 2 with 4ww and S to min guard and occasional verbal cues. Treatment in gym focused on NMR including cone taps and criss cross cone taps, 3 sets x 10 reps each. Pt ascended/descended 12 stairs with 2 rails and min A with verbal cues. Pt returned to room and left sitting up in recliner chair with quick release belt in place and call bell within reach.   Therapy Documentation Precautions:  Precautions Precautions: Fall Restrictions Weight Bearing Restrictions: No General:   Pain: No c/o pain.    Locomotion : Ambulation Ambulation/Gait Assistance: 5: Supervision;4: Min guard   See FIM for current functional status  Therapy/Group: Individual Therapy  Dub Amis 02/18/2015, 3:33 PM

## 2015-02-18 NOTE — Progress Notes (Signed)
Jeremiah Owens is a 81 y.o. male 01/31/1930 [id number]  Subjective: C/o loose stools last night. No abd pain no n/v.  Objective: Vital signs in last 24 hours: Temp:  [98.7 F (37.1 C)-98.8 F (37.1 C)] 98.8 F (37.1 C) (07/17 0542) Pulse Rate:  [73-74] 73 (07/17 0542) Resp:  [18] 18 (07/17 0542) BP: (105-112)/(50-57) 105/57 mmHg (07/17 0542) SpO2:  [98 %-99 %] 98 % (07/17 0542) Weight change:  Last BM Date: 02/17/15  Intake/Output from previous day: 07/16 0701 - 07/17 0700 In: 480 [P.O.:480] Out: -  Last cbgs: CBG (last 3)  No results for input(s): GLUCAP in the last 72 hours.   Physical Exam General: No apparent distress.  HEENT: not dry Lungs: Normal effort. Lungs clear to auscultation, no crackles or wheezes. Cardiovascular: Regular rate and rhythm, no edema Abdomen: S/NT/ND; BS(+) Musculoskeletal:  unchanged Neurological: No new neurological deficits Wounds: N/A    Skin: clear  Aging changes Mental state: Alert, oriented, cooperative    Lab Results: BMET    Component Value Date/Time   NA 135 02/13/2015 0546   NA 140 02/07/2015 1009   K 3.6 02/13/2015 0546   K 4.1 02/07/2015 1009   CL 104 02/13/2015 0546   CO2 27 02/13/2015 0546   CO2 29 02/07/2015 1009   GLUCOSE 93 02/13/2015 0546   GLUCOSE 91 02/07/2015 1009   BUN 11 02/13/2015 0546   BUN 15.2 02/07/2015 1009   CREATININE 0.89 02/13/2015 0546   CREATININE 0.9 02/07/2015 1009   CALCIUM 8.0* 02/13/2015 0546   CALCIUM 9.2 02/07/2015 1009   GFRNONAA >60 02/13/2015 0546   GFRAA >60 02/13/2015 0546   CBC    Component Value Date/Time   WBC 2.7* 02/13/2015 0546   WBC 3.1* 02/07/2015 1009   RBC 3.88* 02/13/2015 0546   RBC 3.82* 02/07/2015 1009   HGB 12.2* 02/13/2015 0546   HGB 12.3* 02/07/2015 1009   HCT 35.7* 02/13/2015 0546   HCT 36.3* 02/07/2015 1009   PLT 98* 02/13/2015 0546   PLT 135* 02/07/2015 1009   MCV 92.0 02/13/2015 0546   MCV 95.2 02/07/2015 1009   MCH 31.4 02/13/2015 0546   MCH  32.3 02/07/2015 1009   MCHC 34.2 02/13/2015 0546   MCHC 33.9 02/07/2015 1009   RDW 16.1* 02/13/2015 0546   RDW 17.1* 02/07/2015 1009   LYMPHSABS 0.4* 02/13/2015 0546   LYMPHSABS 0.3* 02/07/2015 1009   MONOABS 0.3 02/13/2015 0546   MONOABS 0.1 02/07/2015 1009   EOSABS 0.2 02/13/2015 0546   EOSABS 0.0 02/07/2015 1009   BASOSABS 0.0 02/13/2015 0546   BASOSABS 0.0 02/07/2015 1009    Studies/Results: No results found.  Medications: I have reviewed the patient's current medications.  Assessment/Plan:   1. Functional deficits secondary to left basal ganglia infarct 2. DVT Prophylaxis/Anticoagulation: Fixed dose of Coumadin while maintained on Revlimid---no change 3. Pain Management: Robaxin as needed-sx controlled currently 4. Dysphagia. Dysphagia #3 nectar liquids. Encourage fluids 5. Neuropsych: This patient is capable of making decisions on his own behalf. 6. Skin/Wound Care: Routine skin checks 7. Fluids/Electrolytes/Nutrition: will re-check bmet on Monday given his nectar liquids  8. History of multiple myeloma/spinal metastasis. Chemotherapy ongoing per Dr. Gwenyth Allegra weekly on Wednesdays -counts stable  9. Aspiration pneumonia. DC augmentin today. No signs of infection 10. BPH. . Continue Flomax 11. Diarrhea: reoccurred last night. Imodium prn. Doing well this am   Length of stay, days: 6  Walker Kehr , MD 02/18/2015, 8:30 AM

## 2015-02-19 ENCOUNTER — Inpatient Hospital Stay (HOSPITAL_COMMUNITY): Payer: Medicare Other

## 2015-02-19 ENCOUNTER — Telehealth: Payer: Self-pay | Admitting: *Deleted

## 2015-02-19 ENCOUNTER — Inpatient Hospital Stay (HOSPITAL_COMMUNITY): Payer: Medicare Other | Admitting: Speech Pathology

## 2015-02-19 LAB — BASIC METABOLIC PANEL
Anion gap: 6 (ref 5–15)
BUN: 14 mg/dL (ref 6–20)
CO2: 28 mmol/L (ref 22–32)
Calcium: 8.4 mg/dL — ABNORMAL LOW (ref 8.9–10.3)
Chloride: 105 mmol/L (ref 101–111)
Creatinine, Ser: 0.82 mg/dL (ref 0.61–1.24)
GFR calc Af Amer: >60 mL/min (ref >60)
GFR calc non Af Amer: >60 mL/min (ref >60)
GLUCOSE: 85 mg/dL (ref 65–99)
POTASSIUM: 3.9 mmol/L (ref 3.5–5.1)
SODIUM: 139 mmol/L (ref 135–145)

## 2015-02-19 NOTE — Progress Notes (Signed)
Occupational Therapy Session Note  Patient Details  Name: Jeremiah Owens MRN: [id number] Date of Birth: [birth year]  Today's Date: 02/19/2015 OT Individual Time: 1000-1100 OT Individual Time Calculation (min): 60 min    Short Term Goals: Week 1:  OT Short Term Goal 1 (Week 1): STG = LTGs due to ELOS  Skilled Therapeutic Interventions/Progress Updates:    Pt engaged in BADL retraining including bathing at shower level and dressing with sit<>stand from recliner.  Pt completed all bathing and dressing tasks at supervision level including amb with Rollator to gather clothing prior to shower.  Pt completed grooming including shaving while standing at sink (approx 7 mins). Focus on activity tolerance, sit<>stand, standing balance, functional amb with RW, and safety awareness.  Therapy Documentation Precautions:  Precautions Precautions: Fall Restrictions Weight Bearing Restrictions: No Pain: Pain Assessment Pain Assessment: No/denies pain  See FIM for current functional status  Therapy/Group: Individual Therapy  Leroy Libman 02/19/2015, 11:05 AM

## 2015-02-19 NOTE — Telephone Encounter (Signed)
Pt being d/c from inpt rehab wed am . Needs infusion appt on wed changed from 1045 to 145 or noon. His labs will be drawn as inpt that morning.

## 2015-02-19 NOTE — Progress Notes (Addendum)
Physical Therapy Session Note  Patient Details  Name: Colen Eltzroth MRN: [id number] Date of Birth: 02-Apr-1930  Today's Date: 02/19/2015 PT Individual Time: 1300-1400 PT Individual Time Calculation (min): 60 min   Short Term Goals: Week 1:  PT Short Term Goal 1 (Week 1): Will perform bed mobility modI with no cues needed for safety PT Short Term Goal 2 (Week 1): Will perform bed <> w/c transfer with CGA and min cues for safety PT Short Term Goal 3 (Week 1): Will perform w/c propulsion with supervision >241ft PT Short Term Goal 4 (Week 1): Will perform up/down 12 stairs with CGA and min cues needed for hand placement PT Short Term Goal 5 (Week 1): Will perfom gait with RW x175 with supervision     Skilled Therapeutic Interventions/Progress Updates:  tx focused on gait, neuromuscular re-education via forced use, manual cues, VCs, activity tolerance  Gait to/from room with 4WW with supervision, cues for safe hand placement sit>< stand. Up/down 12 steps 2 rails with close supervision.  NuStep at leve 4 x 10 minutes using bil UEs and bil LEs, for activity tolerance.  neuromuscular re-education via forced use, VCs, manual cue for components of transfer: forward wt shift, and balance strategies: 10 x 1 each calf raises and toe raises in supported standing; static > dynamic standing on wedge for prolonged stretch bil HC, facilitation of ankle and hip strategies; reaching and placing cards with R /L hands, and tapping beach ball with 2# weighted held with 2 hands, while sitting astride backless bench, to facilitate anterior pelvic tilt and trunk ext  Pt returned to room and left with quick release belt on, all needs within reach.    Therapy Documentation Precautions:  Precautions Precautions: Fall Restrictions Weight Bearing Restrictions: No        See FIM for current functional status  Therapy/Group: Individual Therapy  Gio Janoski 02/19/2015, 4:08 PM

## 2015-02-19 NOTE — Telephone Encounter (Signed)
Call received from [place] social worker.  Offered Triage help.  Call transferred to collaborative as requested.

## 2015-02-19 NOTE — Progress Notes (Signed)
Speech Language Pathology Daily Session Notes  Patient Details  Name: Jeremiah Owens MRN: [id number] Date of Birth: 02-Jul-1930  Today's Date: 02/19/2015  Session 1: SLP Individual Time: 0800-0900 SLP Individual Time Calculation (min): 60 min   Session 2: SLP Individual Time: 8588-5027 SLP Individual Time Calculation (min): 30 min  Short Term Goals: Week 1: SLP Short Term Goal 1 (Week 1): short term goals = long term goals due to length of stay  Skilled Therapeutic Interventions:  Session 1: Skilled treatment session focused on swallowing and word-finding goals. Upon arrival, patient was completing his breakfast meal of Dys. 3 textures with nectar-thick liquids. Patient required supervision verbal cues for utilization of head turn to the right with liquids and therefore demonstrated an intermittent wet vocal quality and subtle throat clears.  Recommend repeat MBS tomorrow for possible upgrade. Patient also participated in a generative naming task and initially required extra time and supervision semantic cues, however, as the categories became more abstract, the patient required Mod-Max A multimodal cues for generative naming. At end of session, patient requested to sit upright in the recliner and transferred from the bed to the recliner with supervision. Patient left with quick release belt in place and all needs within reach. Continue with current plan of care.  Session 2:  Skilled treatment session focused on dysphagia goals. SLP facilitated session by providing Supervision verbal cues for use of small bites/sips with lunch meal of Dys. 3 textures with nectar-thick liquids. Patient utilized the head turn to the right with Mod I and demonstrated intermittent throat clear X 2, suspect due to residuals. Recommend patient continue current diet with repeat MBS tomorrow to assess for possible upgrade. Patient verbalized understanding of all information. Patient left with quick release belt in place and  all needs within reach. Continue with current plan of care.   FIM:  Comprehension Comprehension Mode: Auditory Comprehension: 6-Follows complex conversation/direction: With extra time/assistive device Expression Expression Mode: Verbal Expression: 4-Expresses basic 75 - 89% of the time/requires cueing 10 - 24% of the time. Needs helper to occlude trach/needs to repeat words. Social Interaction Social Interaction: 6-Interacts appropriately with others with medication or extra time (anti-anxiety, antidepressant). Problem Solving Problem Solving: 5-Solves basic 90% of the time/requires cueing < 10% of the time Memory Memory: 5-Recognizes or recalls 90% of the time/requires cueing < 10% of the time FIM - Eating Eating Activity: 5: Supervision/cues  Pain Pain Assessment Pain Assessment: No/denies pain  Therapy/Group: Individual Therapy  Lenore Moyano, [place] 02/19/2015, 10:59 AM

## 2015-02-19 NOTE — Progress Notes (Signed)
[facility] PHYSICAL MEDICINE & REHABILITATION     PROGRESS NOTE    Subjective/Complaints: Had a productive weekend. No new complaints. Tolerating diet. Would like to advance liquids!  ROS: Pt denies fever, rash/itching, headache, blurred or double vision, nausea, vomiting, abdominal pain, diarrhea, chest pain, shortness of breath, palpitations, dysuria, dizziness, neck or back pain, bleeding, anxiety, or depression   Objective: Vital Signs: Blood pressure 111/48, pulse 72, temperature 98.4 F (36.9 C), temperature source Oral, resp. rate 19, height '5\' 10"'  (1.778 m), weight 71.6 kg (157 lb 13.6 oz), SpO2 98 %. No results found. No results for input(s): WBC, HGB, HCT, PLT in the last 72 hours.  Recent Labs  02/19/15 0549  NA 139  K 3.9  CL 105  GLUCOSE 85  BUN 14  CREATININE 0.82  CALCIUM 8.4*   CBG (last 3)  No results for input(s): GLUCAP in the last 72 hours.  Wt Readings from Last 3 Encounters:  02/16/15 71.6 kg (157 lb 13.6 oz)  02/11/15 73.3 kg (161 lb 9.6 oz)  01/31/15 73.755 kg (162 lb 9.6 oz)    Physical Exam:  Constitutional: He is oriented to person, place, and time.  HENT:  Oral mucosa pink, moist, dentition intact Eyes: EOM are normal.  Neck: Normal range of motion. Neck supple. No thyromegaly present.  Cardiovascular: Normal rate and regular rhythm. no murmurs, rubs Respiratory: Effort normal and breath sounds normal. No respiratory distress.  GI: Soft. Bowel sounds are normal. He exhibits no distension.  Neurological: He is alert and oriented to person, place, and time.  Speech is mildly slurred but intelligible. Right central 7. He is oriented to person place and date. Continued word finding deficits/occasional substitution---worse that he was just waking up. RUE: 4/5 prox to distal. Decreased FMC. RLE: 3+hf, 4/5 ke, adf/apf. LLE: 4+/5. Decreased proprioception in both legs (chronic)  Skin: Skin is warm and dry.  Psychiatric: He has a normal mood  and affect. His behavior is normal    Assessment/Plan: 1. Functional deficits secondary to left basal ganglia infarct which require 3+ hours per day of interdisciplinary therapy in a comprehensive inpatient rehab setting. Physiatrist is providing close team supervision and 24 hour management of active medical problems listed below. Physiatrist and rehab team continue to assess barriers to discharge/monitor patient progress toward functional and medical goals. FIM: FIM - Bathing Bathing Steps Patient Completed: Chest, Right Arm, Left Arm, Abdomen, Front perineal area, Buttocks, Right upper leg, Left upper leg, Right lower leg (including foot), Left lower leg (including foot) Bathing: 4: Steadying assist  FIM - Upper Body Dressing/Undressing Upper body dressing/undressing steps patient completed: Thread/unthread left sleeve of pullover shirt/dress, Put head through opening of pull over shirt/dress, Thread/unthread right sleeve of pullover shirt/dresss, Pull shirt over trunk Upper body dressing/undressing: 6: More than reasonable amount of time FIM - Lower Body Dressing/Undressing Lower body dressing/undressing steps patient completed: Thread/unthread right pants leg, Thread/unthread left pants leg, Pull pants up/down, Don/Doff right sock, Don/Doff left sock, Don/Doff right shoe, Don/Doff left shoe Lower body dressing/undressing: 4: Steadying Assist  FIM - Toileting Toileting steps completed by patient: Adjust clothing prior to toileting, Performs perineal hygiene Toileting Assistive Devices: Grab bar or rail for support Toileting: 3: Mod-Patient completed 2 of 3 steps  FIM - Radio producer Devices: Insurance account manager Transfers: 5-To toilet/BSC: Supervision (verbal cues/safety issues), 5-From toilet/BSC: Supervision (verbal cues/safety issues)  FIM - Control and instrumentation engineer Devices: Adult nurse Transfer: 4: Bed > Chair or  W/C: Min A  (steadying Pt. > 75%)  FIM - Locomotion: Wheelchair Distance: 175 Locomotion: Wheelchair: 0: Activity did not occur FIM - Locomotion: Ambulation Locomotion: Ambulation Assistive Devices: Other (comment) (rollator walker) Ambulation/Gait Assistance: 5: Supervision, 4: Min guard Locomotion: Ambulation: 4: Travels 150 ft or more with minimal assistance (Pt.>75%)  Comprehension Comprehension Mode: Auditory Comprehension: 6-Follows complex conversation/direction: With extra time/assistive device  Expression Expression Mode: Verbal Expression: 4-Expresses basic 75 - 89% of the time/requires cueing 10 - 24% of the time. Needs helper to occlude trach/needs to repeat words.  Social Interaction Social Interaction: 6-Interacts appropriately with others with medication or extra time (anti-anxiety, antidepressant).  Problem Solving Problem Solving: 5-Solves basic 90% of the time/requires cueing < 10% of the time  Memory Memory: 5-Recognizes or recalls 90% of the time/requires cueing < 10% of the time  Medical Problem List and Plan: 1. Functional deficits secondary to left basal ganglia infarct 2. DVT Prophylaxis/Anticoagulation: Fixed dose of Coumadin while maintained on Revlimid-continue for now 3. Pain Management: Robaxin as needed-sx controlled currently 4. Dysphagia. Dysphagia #3 nectar liquids. Encourage fluids---f/u MBS today? 5. Neuropsych: This patient is capable of making decisions on his own behalf. 6. Skin/Wound Care: Routine skin checks 7. Fluids/Electrolytes/Nutrition: reviewed labs---lytes/renal function stable 8. History of multiple myeloma/spinal metastasis. Chemotherapy ongoing per Dr. Gwenyth Allegra weekly on Wednesdays  -counts stable  9. Aspiration pneumonia.  Lungs remain clear 10. BPH.  . Continue Flomax 11. Diarrhea: resolved  LOS (Days) 7 A FACE TO FACE EVALUATION WAS PERFORMED  Chermaine Schnyder T 02/19/2015 8:24 AM

## 2015-02-20 ENCOUNTER — Inpatient Hospital Stay (HOSPITAL_COMMUNITY): Payer: Medicare Other | Admitting: Speech Pathology

## 2015-02-20 ENCOUNTER — Other Ambulatory Visit: Payer: Self-pay | Admitting: Medical Oncology

## 2015-02-20 ENCOUNTER — Telehealth: Payer: Self-pay | Admitting: Medical Oncology

## 2015-02-20 ENCOUNTER — Ambulatory Visit (HOSPITAL_COMMUNITY): Payer: Medicare Other | Admitting: *Deleted

## 2015-02-20 ENCOUNTER — Inpatient Hospital Stay (HOSPITAL_COMMUNITY): Payer: Medicare Other

## 2015-02-20 ENCOUNTER — Telehealth: Payer: Self-pay | Admitting: *Deleted

## 2015-02-20 NOTE — Progress Notes (Signed)
Occupational Therapy Discharge Summary  Patient Details  Name: Jeremiah Owens MRN: [id number] Date of Birth: 02-02-1930   Patient has met 10 of 10 long term goals due to improved activity tolerance, improved balance and postural control.  Pt made steady progress with BADLs during this admission.  Pt is overall supervision for bathing and toileting tasks and mod I for dressing tasks.  Pt continues to fatigue easily and requires multiple rest breaks during BADLs.  Pt's wife has been present during therapy session. Pt Patient to discharge at overall Supervision level.  Patient's care partner is independent to provide the necessary assistance at discharge.   Recommendation:  Patient will benefit from ongoing skilled OT services in outpatient setting to continue to advance functional skills in the area of BADL, iADL and Reduce care partner burden.  Equipment: No equipment provided Pt owns all necessary equipment.  Reasons for discharge: treatment goals met and discharge from hospital  Patient/family agrees with progress made and goals achieved: Yes  OT Discharge     Vision/Perception  Vision- History Baseline Vision/History: Wears glasses Wears Glasses: At all times Patient Visual Report: No change from baseline Vision- Assessment Vision Assessment?: No apparent visual deficits  Cognition Overall Cognitive Status: Within Functional Limits for tasks assessed Arousal/Alertness: Awake/alert Orientation Level: Oriented X4 Memory Impairment: Decreased short term memory Decreased Short Term Memory: Verbal basic Safety/Judgment: Impaired Comments: slightly decreased safety awareness  Sensation Sensation Light Touch: Appears Intact Stereognosis: Appears Intact Hot/Cold: Appears Intact Proprioception: Appears Intact Coordination Gross Motor Movements are Fluid and Coordinated: Yes Fine Motor Movements are Fluid and Coordinated: Yes Finger Nose Finger Test: [facility] Motor  Motor Motor:  Within Functional Limits Motor - Discharge Observations: posterior sway in standing  Mobility  Bed Mobility Supine to Sit: 5: Supervision Sit to Supine: 5: Supervision Transfers Transfers: Sit to Stand;Stand to Sit Sit to Stand: 5: Supervision Stand to Sit: 5: Supervision  Trunk/Postural Assessment  Cervical Assessment Cervical Assessment: Within Functional Limits Thoracic Assessment Thoracic Assessment: Exceptions to [facility] (kyphotic posture ) Lumbar Assessment Lumbar Assessment: Exceptions to [facility] (posterior pelvic tilt)  Balance Balance Balance Assessed: Yes Dynamic Sitting Balance Dynamic Sitting - Level of Assistance: 6: Modified independent (Device/Increase time) Static Standing Balance Static Standing - Level of Assistance: 6: Modified independent (Device/Increase time) Dynamic Standing Balance Dynamic Standing - Level of Assistance: 6: Modified independent (Device/Increase time) Extremity/Trunk Assessment RUE Assessment RUE Assessment: Within Functional Limits LUE Assessment LUE Assessment: Exceptions to [facility] (shoulder flexion/ROM restricted due to arthritis; grossly 4/5 MMT)  See FIM for current functional status  Leroy Libman 02/20/2015, 1:24 PM

## 2015-02-20 NOTE — Discharge Summary (Signed)
Discharge summary job # [id number]

## 2015-02-20 NOTE — Progress Notes (Signed)
[facility] PHYSICAL MEDICINE & REHABILITATION     PROGRESS NOTE    Subjective/Complaints: No complaints. Asked me if he's still going home tomorrow. Great appetite  ROS: Pt denies fever, rash/itching, headache, blurred or double vision, nausea, vomiting, abdominal pain, diarrhea, chest pain, shortness of breath, palpitations, dysuria, dizziness, neck or back pain, bleeding, anxiety, or depression   Objective: Vital Signs: Blood pressure 116/66, pulse 77, temperature 98.1 F (36.7 C), temperature source Oral, resp. rate 18, height '5\' 10"'  (1.778 m), weight 71.6 kg (157 lb 13.6 oz), SpO2 99 %. No results found. No results for input(s): WBC, HGB, HCT, PLT in the last 72 hours.  Recent Labs  02/19/15 0549  NA 139  K 3.9  CL 105  GLUCOSE 85  BUN 14  CREATININE 0.82  CALCIUM 8.4*   CBG (last 3)  No results for input(s): GLUCAP in the last 72 hours.  Wt Readings from Last 3 Encounters:  02/16/15 71.6 kg (157 lb 13.6 oz)  02/11/15 73.3 kg (161 lb 9.6 oz)  01/31/15 73.755 kg (162 lb 9.6 oz)    Physical Exam:  Constitutional: He is oriented to person, place, and time.  HENT:  Oral mucosa pink, moist, dentition intact Eyes: EOM are normal.  Neck: Normal range of motion. Neck supple. No thyromegaly present.  Cardiovascular: Normal rate and regular rhythm. no murmurs, rubs Respiratory: Effort normal and breath sounds normal. No respiratory distress.  GI: Soft. Bowel sounds are normal. He exhibits no distension.  Neurological: He is alert and oriented to person, place, and time.  Speech is mildly slurred but intelligible. Right central 7. He is oriented to person place and date. Continued word finding deficits/occasional substitution---worse that he was just waking up. RUE: 4/5 prox to distal. Decreased FMC. RLE: 3+hf, 4/5 ke, adf/apf. LLE: 4+/5. Decreased proprioception in both legs (chronic)  Skin: Skin is warm and dry.  Psychiatric: He has a normal mood and affect. His  behavior is normal    Assessment/Plan: 1. Functional deficits secondary to left basal ganglia infarct which require 3+ hours per day of interdisciplinary therapy in a comprehensive inpatient rehab setting. Physiatrist is providing close team supervision and 24 hour management of active medical problems listed below. Physiatrist and rehab team continue to assess barriers to discharge/monitor patient progress toward functional and medical goals. FIM: FIM - Bathing Bathing Steps Patient Completed: Chest, Right Arm, Left Arm, Abdomen, Front perineal area, Buttocks, Right upper leg, Left upper leg, Right lower leg (including foot), Left lower leg (including foot) Bathing: 5: Supervision: Safety issues/verbal cues  FIM - Upper Body Dressing/Undressing Upper body dressing/undressing steps patient completed: Thread/unthread left sleeve of pullover shirt/dress, Put head through opening of pull over shirt/dress, Thread/unthread right sleeve of pullover shirt/dresss, Pull shirt over trunk Upper body dressing/undressing: 6: More than reasonable amount of time FIM - Lower Body Dressing/Undressing Lower body dressing/undressing steps patient completed: Thread/unthread right pants leg, Thread/unthread left pants leg, Pull pants up/down, Don/Doff right sock, Don/Doff left sock, Don/Doff right shoe, Don/Doff left shoe, Thread/unthread right underwear leg, Thread/unthread left underwear leg, Pull underwear up/down Lower body dressing/undressing: 5: Supervision: Safety issues/verbal cues  FIM - Toileting Toileting steps completed by patient: Adjust clothing prior to toileting, Performs perineal hygiene Toileting Assistive Devices: Grab bar or rail for support Toileting: 3: Mod-Patient completed 2 of 3 steps  FIM - Radio producer Devices: Insurance account manager Transfers: 5-To toilet/BSC: Supervision (verbal cues/safety issues), 5-From toilet/BSC: Supervision (verbal cues/safety  issues)  FIM -  Bed/Chair Financial planner Devices: Environmental consultant [phone]) Bed/Chair Transfer: 5: Bed > Chair or W/C: Supervision (verbal cues/safety issues), 5: Chair or W/C > Bed: Supervision (verbal cues/safety issues)  FIM - Locomotion: Wheelchair Distance: 175 Locomotion: Wheelchair: 0: Activity did not occur FIM - Locomotion: Ambulation Locomotion: Ambulation Assistive Devices: Administrator [phone]) Ambulation/Gait Assistance: 5: Supervision, 4: Min guard Locomotion: Ambulation: 4: Travels 150 ft or more with minimal assistance (Pt.>75%)  Comprehension Comprehension Mode: Auditory Comprehension: 6-Follows complex conversation/direction: With extra time/assistive device  Expression Expression Mode: Verbal Expression: 4-Expresses basic 75 - 89% of the time/requires cueing 10 - 24% of the time. Needs helper to occlude trach/needs to repeat words.  Social Interaction Social Interaction: 6-Interacts appropriately with others with medication or extra time (anti-anxiety, antidepressant).  Problem Solving Problem Solving: 5-Solves basic 90% of the time/requires cueing < 10% of the time  Memory Memory: 5-Recognizes or recalls 90% of the time/requires cueing < 10% of the time  Medical Problem List and Plan: 1. Functional deficits secondary to left basal ganglia infarct 2. DVT Prophylaxis/Anticoagulation: Fixed dose of Coumadin while maintained on Revlimid-continue for now 3. Pain Management: Robaxin as needed-sx controlled currently 4. Dysphagia. Dysphagia #3 nectar liquids. MBS today---advance diet if indicated 5. Neuropsych: This patient is capable of making decisions on his own behalf. 6. Skin/Wound Care: skin intact 7. Fluids/Electrolytes/Nutrition: reviewed labs---lytes/renal function stable--eating well 8. History of multiple myeloma/spinal metastasis. Chemotherapy ongoing per Dr. Gwenyth Allegra weekly on Wednesdays  -need to see how Mohammed would like to  arrange tomorrow---could do in the AM prior to dc  9. Aspiration pneumonia.  Lungs remain clear 10. BPH.  . Continue Flomax 11. Diarrhea: resolved  LOS (Days) 8 A FACE TO FACE EVALUATION WAS PERFORMED  Donisha Hoch T 02/20/2015 7:24 AM

## 2015-02-20 NOTE — Progress Notes (Signed)
Speech Language Pathology Session Note & Discharge Summary  Patient Details  Name: Jeremiah Owens MRN: [id number] Date of Birth: 12/29/1929  Today's Date: 02/20/2015 SLP Individual Time: 1300-1405 SLP Individual Time Calculation (min): 65 min   Skilled Therapeutic Interventions:  Skilled treatment session focused on completion of patient and family education in regards to the patient's current swallowing function and functional communication. Both the patient and his wife educated on the patient's current swallowing function, diet recommendations, appropriate textures, medication administration and swallowing compensatory strategies. Both were also shown the video from the patient's MBS today and handouts were also given to reinforce information. Both were also educated on the patient's current word-finding deficits and strategies to utilize at home to maximize word-finding and functional communication. Handouts were also given to reinforce information. Both verbalized understanding of all information and patient's wife asked appropriate questions. Patient was observed consuming thin liquids without overt s/s of aspiration and was Mod I for use of a head turn to the right. The patient's wife also provided appropriate cueing during a verbal description task at the phrase level to maximize word-finding. Patient will discharge home tomorrow. Patient handed off to PT.   Patient has met 3 of 3 long term goals.  Patient to discharge at overall Modified Independent;Min;Mod  level.   Reasons goals not met: N/A   Clinical Impression/Discharge Summary: Patient has made functional gains and has met 3 of 3 LTG's this admission due to increased word-finding with use of strategies and swallowing function. Patient had repeat MBS today and is currently consuming Dys. 3 textures with thin liquids with use of a head turn to the right.  Patient is demonstrating minimal overt s/s of aspiration and is utilizing his  swallowing strategies with Mod I. Patient continues to demonstrate decreased word-finding at the phrase level during a functional conversation but can name functional objects and verbalize basic information with Min A multimodal cues. Patient and family education has been completed and patient will discharge home with his wife. Patient would benefit from f/u SLP services to maximize his functional communication and swallowing function in order to reduce caregiver burden.   Care Partner:  Caregiver Able to Provide Assistance: Yes  Type of Caregiver Assistance: Physical  Recommendation:  Outpatient SLP;24 hour supervision/assistance  Rationale for SLP Follow Up: Maximize functional communication;Maximize swallowing safety;Reduce caregiver burden   Equipment: N/A   Reasons for discharge: Treatment goals met;Discharged from hospital   Patient/Family Agrees with Progress Made and Goals Achieved: Yes   See FIM for current functional status  Lulabelle Desta 02/20/2015, 3:07 PM

## 2015-02-20 NOTE — Progress Notes (Signed)
Physical Therapy Discharge Summary  Patient Details  Name: Jeremiah Owens MRN: [id number] Date of Birth: 10-06-1929  Today's Date: 02/20/2015 PT Individual Time: 1405-1505 PT Individual Time Calculation (min): 60 min    Patient has met 12 of 13 long term goals due to improved activity tolerance, improved balance, improved postural control and ability to compensate for deficits.  Patient to discharge at an ambulatory level Supervision.   Patient's care partner is independent to provide the necessary cognitive assistance at discharge.  Reasons goals not met: Pt did not meet Mod I transfer goal as he continues to need S for safety and fall risk reduction, which he will have at home.  Recommendation:  Patient will benefit from ongoing skilled PT services in outpatient setting to continue to advance safe functional mobility, address ongoing impairments in joint flexibility, strength, NMR, balance, postural control and minimize fall risk.  Equipment: No equipment provided  Reasons for discharge: treatment goals met and discharge from hospital  Patient/family agrees with progress made and goals achieved: Yes   Tx today focused on family training, including gait and mobility in controlled, home, and community simulated settings at S level. Wife was present and engaged, agreeable to all safety needs and cued pt appropriately at close S level with rollator. Performed car transfer, stairs, apartment mobility, and balance tasks. Demonstration provided for floor transfer, which was return-demonstrated at [facility] A level. Pt reminded to have wife assisting for close S at all times at home.  Pt/wife instructed in HEP including HS and heel cord stretching as well as [place] HEP with no questions. The feel prepared for DC and look forward to OPPT.   PT Discharge Precautions/Restrictions Precautions Precautions: Fall (Expressive Aphasia) Restrictions Weight Bearing Restrictions: No Vital Signs Therapy  Vitals Temp: 97.6 F (36.4 C) Temp Source: Oral Pulse Rate: 69 Resp: 18 BP: (!) 115/46 mmHg Patient Position (if appropriate): Sitting Oxygen Therapy SpO2: 98 % O2 Device: Not Delivered Pain Pain Assessment Pain Assessment: No/denies pain Vision/Perception  Vision - History Baseline Vision: Wears glasses all the time Patient Visual Report: No change from baseline Vision - Assessment Eye Alignment: Within Functional Limits Perception Perception: Within Functional Limits  Cognition Overall Cognitive Status: Within Functional Limits for tasks assessed Arousal/Alertness: Awake/alert Orientation Level: Oriented X4 Safety/Judgment: Impaired Comments: slightly decreased safety awareness, wife aware and providing close S Sensation Sensation Light Touch: Appears Intact Stereognosis: Appears Intact Hot/Cold: Appears Intact Proprioception: Appears Intact Coordination Gross Motor Movements are Fluid and Coordinated: Yes Fine Motor Movements are Fluid and Coordinated: No ([place] limited in functional tasks) Heel Shin Test: [facility] Motor  Motor Motor: Within Functional Limits Motor - Skilled Clinical Observations: grossly coordinated throghout BLEs, but poor posture and postural control/balance strategies, decreased eccentric strength Motor - Discharge Observations: posterior sway in standing, decreased hip ext   Mobility Bed Mobility Bed Mobility: Supine to Sit;Sit to Supine Supine to Sit: 6: Modified independent (Device/Increase time) Sit to Supine: 6: Modified independent (Device/Increase time) Transfers Transfers: Yes Sit to Stand: 5: Supervision Stand to Sit: 5: Supervision Stand Pivot Transfers: 5: Supervision Stand Pivot Transfer Details (indicate cue type and reason): S needed throughout due to decreased safety awareness of posterior sway Locomotion  Ambulation Ambulation: Yes Ambulation/Gait Assistance: 5: Supervision Ambulation Distance (Feet): 150 Feet Assistive  device: Rollator Ambulation/Gait Assistance Details: Verbal cues for precautions/safety;Verbal cues for gait pattern Ambulation/Gait Assistance Details: Cues for safety during transitions and foot clearance Gait Gait: Yes Gait Pattern: Impaired Gait Pattern: Shuffle;Narrow base of support;Decreased dorsiflexion -  right;Poor foot clearance - right;Trunk flexed;Step-through pattern;Decreased weight shift to left;Right flexed knee in stance;Left flexed knee in stance Stairs / Additional Locomotion Stairs: Yes Stairs Assistance: 5: Supervision Stairs Assistance Details (indicate cue type and reason): Cues for full foot placement on step, awareness of posterior lean Stair Management Technique: Two rails;Alternating pattern Number of Stairs: 12 Height of Stairs: 6 Wheelchair Mobility Wheelchair Mobility: Yes Wheelchair Assistance: 5: Supervision;6: Modified independent (Device/Increase time) (S in busy environement/community; Mod I controlled/home sett) Environmental health practitioner: Both lower extermities Distance: 250  Trunk/Postural Assessment  Cervical Assessment Cervical Assessment: Within Functional Limits Thoracic Assessment Thoracic Assessment: Exceptions to [facility] (kyphotic posture ) Lumbar Assessment Lumbar Assessment: Exceptions to [facility] Lumbar AROM Overall Lumbar AROM Comments: reduced lumbar lordosis, posterior pelvic tilt Postural Control Postural Control: Deficits on evaluation Protective Responses: Delayed due to decreased awareness   Balance   Extremity Assessment  RUE Assessment RUE Assessment: Within Functional Limits LUE Assessment LUE Assessment: Within Functional Limits RLE Assessment RLE Assessment: Exceptions to [facility] RLE Strength RLE Overall Strength Comments: 3+/5 hip flexion LLE Assessment LLE Assessment: Exceptions to [facility] LLE Strength LLE Overall Strength Comments: 3+/5 hip flexion  See FIM for current functional status  Gyselle Matthew Soundra Pilon, PT,  DPT  02/20/2015, 4:23 PM

## 2015-02-20 NOTE — Progress Notes (Signed)
Speech Language Pathology Note  Patient Details  Name: Jeremiah Owens MRN: [id number] Date of Birth: 1929-09-10 Today's Date: 02/20/2015  MBSS complete. Full report located under chart review in imaging section.    Jeremiah Owens 02/20/2015, 2:56 PM

## 2015-02-20 NOTE — Progress Notes (Signed)
Occupational Therapy Session Note  Patient Details  Name: Jeremiah Owens MRN: [id number] Date of Birth: 18-Apr-1930  Today's Date: 02/20/2015 OT Individual Time: 0800-0900 OT Individual Time Calculation (min): 60 min    Short Term Goals: Week 1:  OT Short Term Goal 1 (Week 1): STG = LTGs due to ELOS  Skilled Therapeutic Interventions/Progress Updates:    Pt seen for 1:1 OT session with a focus on ADL retraining, dynamic standing balance, functional tranfers, functional ambulation, activity tolerance, safety awareness, and pt education. Pt received seated in recliner with breakfast tray with no complaint of pain. Pt able to perform eating at mod I level for increased time.  Pt completed initial sit>stand from recliner with min A likely due to pt report of prolonged sitting. Pt ambulated to BR and completed shower transfer to shower seat with supervision. Pt completed bathing sit<>stand with supervision and pt demonstrating good safety throughout task with utilization of grab bars for balance. Pt then requesting to toilet and completing toilet transfer via rollator and supervision. Pt then engaged in grooming in standing at sink for 6 minutes at mod I level. Pt completed dressing sit<>stand from recliner at mod I for increased time and use of rollator while in standing. Pt then transferred to w/c and left with tech for completion of swallow study.   Therapy Documentation Precautions:  Precautions Precautions: Fall Restrictions Weight Bearing Restrictions: No General:   Vital Signs: Therapy Vitals Temp: 98.1 F (36.7 C) Temp Source: Oral Pulse Rate: 77 Resp: 18 BP: 116/66 mmHg Patient Position (if appropriate): Lying Oxygen Therapy SpO2: 99 % O2 Device: Not Delivered Pain:   ADL:   Exercises:   Other Treatments:    See FIM for current functional status  Therapy/Group: Individual Therapy  Dorann Ou 02/20/2015, 8:02 AM

## 2015-02-20 NOTE — Telephone Encounter (Signed)
Called Ginny with pt appts.

## 2015-02-20 NOTE — Discharge Summary (Signed)
NAMEBEJAMIN, Jeremiah Owens NO.:  [id number]  MEDICAL RECORD NO.:  [id number]  LOCATION:  4M07C                        FACILITY:  [facility]  PHYSICIAN:  Meredith Staggers, M.D.DATE OF BIRTH:  06-01-1930  DATE OF ADMISSION:  02/12/2015 DATE OF DISCHARGE:  02/21/2015                              DISCHARGE SUMMARY   DISCHARGE DIAGNOSES: 1. Functional deficits secondary to left basal ganglia infarction. 2. Fixed dose of Coumadin while maintained on Revlimid. 3. Pain management. 4. Dysphagia. 5. Multiple myeloma with spinal metaphysis. 6. Aspiration pneumonia resolved. 7. Benign prostatic hypertrophy.  HISTORY OF PRESENT ILLNESS:  This is an 81 year old right-handed male with history of multiple myeloma, on chemotherapy as well as spinal metastasis.  He has received radiation followed by both Dr. Lisbeth Renshaw, Dr. Inda Merlin.  Remains on chronic fixed Coumadin while maintained on Revlimid.  He is well known to [employer] from admission November, 2015 for thoracic spine decompression and fusion related to multiple myeloma.  He lives with his wife.  Used to rolling walker prior to admission.  Presented February 11, 2015, with right-sided weakness and slurred speech x2 days.  MRI of the brain showed acute left basal ganglia, lacunar infarct, as well as old right caudate lacunar infarct. MRA of the head unremarkable.  Carotid Dopplers with no RCA stenosis. Echocardiogram with ejection fraction of 70% grade 1 diastolic dysfunction.  The patient did not receive tPA.  Maintained on aspirin as well as fixed dose of Coumadin 2 mg daily.  Suspect aspiration pneumonia.  He completed a course of Unasyn which was changed to Augmentin x5 days.  He continues on a mechanical soft nectar thick liquid diet.  He receives weekly chemotherapy at [facility]. Physical and occupational therapy ongoing.  The patient was admitted for comprehensive rehab program.  PAST MEDICAL HISTORY:  See  discharge diagnoses.  SOCIAL HISTORY:  Lives with spouse, used a walker prior to admission.  FUNCTIONAL STATUS:  Upon admission to [facility], minimal assist ambulate 75 feet with the rolling walker, minimal assist sit to stand, min to mod assist activities of daily living.  PHYSICAL EXAMINATION:  VITAL SIGNS:  Blood pressure 130/54, pulse 73, temperature 98, respirations 16.  This was an alert male, oriented x3. Follows simple commands.  Fair awareness of deficits. LUNGS:  Clear to auscultation. CARDIAC:  Rate controlled. ABDOMEN:  Soft, nontender.  Good bowel sounds.  REHABILITATION HOSPITAL COURSE:  The patient was admitted to Inpatient Rehab Services with therapies initiated on a 3-hour daily basis consisting of physical therapy, occupational therapy, speech therapy, and rehabilitation nursing.  The following issues were addressed during the patient's rehabilitation stay.  Pertaining to Mr. Fassnacht 's functional deficits secondary to left basal ganglion infarct, he remained on aspirin as well as fixed dose Coumadin, he would follow up with Neurology Services.  He was currently on a mechanical soft diet, nectar liquids.  This would be adjusted and advanced as per Speech Therapy after modified barium swallow.  Blood pressures remained well controlled.  He is followed by Dr. Inda Merlin for multiple myeloma. Spinal metastasis, receiving weekly chemotherapy as well as continuing on Revlimid and weekly Decadron.  He completed a course of  Augmentin for aspiration pneumonia, remaining afebrile.  The patient received weekly collaborative interdisciplinary team conferences to discuss estimated length of stay, family teaching, and any barriers to discharge.  He was ambulating supervision with assisted device up and downstairs, close supervision.  He completed all bathing and dressing tasks again on supervision level.  Strength and endurance continued to improve.  The patient's wife  encouraged progress and plan discharged to home.  DISCHARGE MEDICATIONS:  Includes: 1. Zovirax 400 mg p.o. b.i.d. 2. Aspirin 325 mg p.o. daily. 3. Vitamin D 1000 units p.o. daily. 4. Decadron 40 mg weekly. 5. Hydrocodone 1-2 tablets every 6 hours as needed for pain, dispensed     90 tablets. 6. Revlimid 25 mg p.o. weekly. 7. Synthroid 25 mg p.o. daily. 8. Robaxin 500 mg p.o. every 6 hours as needed muscle spasms. 9. Protonix 40 mg p.o. daily. 10.Florastor 250 mg p.o. b.i.d. 11.Flomax 0.4 mg daily. 12.Coumadin 2 mg daily.  DIET:  Mechanical soft nectar liquids.  Advanced accordingly as per Speech Therapy.  FOLLOWUP:  He would follow up with Dr. [name] at the Outpatient [facility] on 04/10/2015; Dr. Inda Merlin, Oncology Services; Dr. Antony Contras; Dr. Lysle Rubens, Medical Management.  SPECIAL INSTRUCTIONS:  Continue weekly chemotherapy as per Dr. Inda Merlin.     Lauraine Rinne, P.A.   ______________________________ Meredith Staggers, M.D.    DA/MEDQ  D:  02/20/2015  T:  02/20/2015  Job:  [id number]  cc:   Wenda Low, MD Pramod P. Leonie Man, MD Dr. Karle Barr, M.D.

## 2015-02-20 NOTE — Telephone Encounter (Signed)
I have called Sonia Baller back at [employer] at [phone]. I left her a message with the appt for 7/21. I left message explaining that we can't charge inpt and outpt in the same day. I left my direct number to call for concerns. JMW

## 2015-02-21 ENCOUNTER — Ambulatory Visit: Payer: Medicare Other

## 2015-02-21 ENCOUNTER — Other Ambulatory Visit: Payer: Medicare Other

## 2015-02-21 ENCOUNTER — Telehealth: Payer: Self-pay | Admitting: Internal Medicine

## 2015-02-21 LAB — CBC
HCT: 35.2 % — ABNORMAL LOW (ref 39.0–52.0)
Hemoglobin: 12.1 g/dL — ABNORMAL LOW (ref 13.0–17.0)
MCH: 32.2 pg (ref 26.0–34.0)
MCHC: 34.4 g/dL (ref 30.0–36.0)
MCV: 93.6 fL (ref 78.0–100.0)
PLATELETS: 111 10*3/uL — AB (ref 150–400)
RBC: 3.76 MIL/uL — ABNORMAL LOW (ref 4.22–5.81)
RDW: 16.4 % — ABNORMAL HIGH (ref 11.5–15.5)
WBC: 4 10*3/uL (ref 4.0–10.5)

## 2015-02-21 LAB — COMPREHENSIVE METABOLIC PANEL
ALBUMIN: 3 g/dL — AB (ref 3.5–5.0)
ALT: 25 U/L (ref 17–63)
AST: 17 U/L (ref 15–41)
Alkaline Phosphatase: 48 U/L (ref 38–126)
Anion gap: 7 (ref 5–15)
BILIRUBIN TOTAL: 1.1 mg/dL (ref 0.3–1.2)
BUN: 12 mg/dL (ref 6–20)
CHLORIDE: 101 mmol/L (ref 101–111)
CO2: 26 mmol/L (ref 22–32)
Calcium: 7.9 mg/dL — ABNORMAL LOW (ref 8.9–10.3)
Creatinine, Ser: 0.84 mg/dL (ref 0.61–1.24)
GFR calc Af Amer: >60 mL/min (ref >60)
Glucose, Bld: 94 mg/dL (ref 65–99)
Potassium: 4.1 mmol/L (ref 3.5–5.1)
SODIUM: 134 mmol/L — AB (ref 135–145)
Total Protein: 5 g/dL — ABNORMAL LOW (ref 6.5–8.1)

## 2015-02-21 LAB — PROTIME-INR
INR: 1.14 (ref 0.00–1.49)
PROTHROMBIN TIME: 14.8 s (ref 11.6–15.2)

## 2015-02-21 MED ORDER — METHOCARBAMOL 500 MG PO TABS: ORAL_TABLET | ORAL | Status: DC

## 2015-02-21 MED ORDER — TAMSULOSIN HCL 0.4 MG PO CAPS: 0.4000 mg | ORAL_CAPSULE | Freq: Every day | ORAL | Status: DC

## 2015-02-21 MED ORDER — HYDROCODONE-ACETAMINOPHEN 5-325 MG PO TABS: 1.0000 | ORAL_TABLET | Freq: Four times a day (QID) | ORAL | Status: DC | PRN

## 2015-02-21 MED ORDER — SACCHAROMYCES BOULARDII 250 MG PO CAPS: 250.0000 mg | ORAL_CAPSULE | Freq: Two times a day (BID) | ORAL | Status: DC

## 2015-02-21 MED ORDER — ACYCLOVIR 400 MG PO TABS: 400.0000 mg | ORAL_TABLET | Freq: Two times a day (BID) | ORAL | Status: DC

## 2015-02-21 MED ORDER — LEVOTHYROXINE SODIUM 25 MCG PO TABS: 25.0000 ug | ORAL_TABLET | Freq: Every day | ORAL | Status: DC

## 2015-02-21 NOTE — Progress Notes (Signed)
PHYSICAL MEDICINE & REHABILITATION     PROGRESS NOTE    Subjective/Complaints: Happy that diet advanced. No problems overnight.   ROS: Pt denies fever, rash/itching, headache, blurred or double vision, nausea, vomiting, abdominal pain, diarrhea, chest pain, shortness of breath, palpitations, dysuria, dizziness, neck or back pain, bleeding, anxiety, or depression   Objective: Vital Signs: Blood pressure 104/48, pulse 77, temperature 98.5 F (36.9 C), temperature source Oral, resp. rate 16, height '5\' 10"'  (1.778 m), weight 71.6 kg (157 lb 13.6 oz), SpO2 99 %. Dg Swallowing Func-speech Pathology  02/20/2015    Objective Swallowing Evaluation:    Patient Details  Name: Jeremiah Owens MRN: [id number] Date of Birth: 05-31-1930  Today's Date: 02/20/2015 Time: SLP Start Time (ACUTE ONLY): 0900-SLP Stop Time (ACUTE ONLY): 0930 SLP Time Calculation (min) (ACUTE ONLY): 30 min  Past Medical History:  Past Medical History  Diagnosis Date  . Compression fracture   . Allergy   . Anxiety   . Thyroid disease   . S/P radiation therapy 08/21/14-09/04/14    T4-6 25Gy/87f  . S/P radiation therapy 11/29/14-12/12/14    rt prox humerus/shoulder/scapula 25Gy/121f . Encounter for antineoplastic chemotherapy 01/31/2015  . Melanoma   . Bone cancer     t_spine T-5  . Prostate cancer 2002  . Skin cancer 1994    melanoma  right neck   . Multiple myeloma    Past Surgical History:  Past Surgical History  Procedure Laterality Date  . Hernia repair    . Posterior cervical fusion/foraminotomy N/A 06/17/2014    Procedure: Thoracic five laminectomy for epidural tumor resection  Thoracic 4-7 posterior lateral arthrodesis, segmental pedicle screw  fixation.;  Surgeon: KyAshok PallMD;  [facility];  Service:  Neurosurgery;  Laterality: N/A;   HPI:  Other Pertinent Information: [id number].[id number]. right handed male with history of  multiple myeloma on chemotherapy on Revlimid, radiation therapy for spinal  metastasis  08/21/2014-09/04/2014 as well as 11/29/2014-12/12/2014 per Dr.  MoLisbeth Renshawnd Dr. MoEarlie ServerChronic Fixed dose Coumadin while maintained on  Revlimid. Well known to rehabilitation services from admission November  2015 for thoracic spine decompression and fusion related to multiple  myeloma. Patient lives with his wife used a rolling walker to ambulate  short community distances prior to admission. Presented 02/11/2015 with  right sided weakness and slurred speech 2 days. MRI of the brain showed  acute left basal ganglia lacunar infarction as well as old right caudate  lacunar infarct. Patient had MBS on 02/11/15 and recommended Dys. 3  textures with nectar-thick liquids with head turn to the right. Patient  admitted to CIR on 02/13/15 and participating in dysphagia treatment.  Repeat MBS today to assess for possible upgrade.   No Data Recorded  Assessment / Plan / Recommendation CHL IP CLINICAL IMPRESSIONS 02/20/2015  Therapy Diagnosis Mild pharyngeal phase dysphagia  Clinical Impression Patient presents with a mild sensorimotor pharyngeal  dysphagia characterized by a delayed swallow initiation resulting in  moderate valleculae and lateral channel residue that is penetrated during  the swallow. With a right head turn, laryngeal closure was improved  resulting in reduced residuals and penetration. The remaining trace  penetrates were eliminated with a cued throat clear. Recommend patient  continue Dys. 3 textures and upgrade to thin liquids with continued use of  the head turn to the right. Patient verbalized understanding of all  information.       CHL IP TREATMENT RECOMMENDATION 02/11/2015  Treatment Recommendations Therapy as outlined  in treatment plan below     CHL IP DIET RECOMMENDATION 02/20/2015  SLP Diet Recommendations Dysphagia 3 (Mech soft);Thin  Liquid Administration via (None)  Medication Administration Whole meds with puree  Compensations Small sips/bites;Slow rate;Clear throat intermittently  Postural  Changes and/or Swallow Maneuvers (None)     CHL IP OTHER RECOMMENDATIONS 02/20/2015  Recommended Consults (None)  Oral Care Recommendations Oral care BID  Other Recommendations (None)     CHL IP FOLLOW UP RECOMMENDATIONS 02/12/2015  Follow up Recommendations Inpatient Rehab     CHL IP FREQUENCY AND DURATION 02/11/2015  Speech Therapy Frequency (ACUTE ONLY) min 2x/week  Treatment Duration 2 weeks     Pertinent Vitals/Pain: N/A      SLP Swallow Goals No flowsheet data found.  No flowsheet data found.    CHL IP REASON FOR REFERRAL 02/20/2015  Reason for Referral Objectively evaluate swallowing function     CHL IP ORAL PHASE 02/20/2015  Lips (None)  Tongue (None)  Mucous membranes (None)  Nutritional status (None)  Other (None)  Oxygen therapy (None)  Oral Phase WFL  Oral - Pudding Teaspoon (None)  Oral - Pudding Cup (None)  Oral - Honey Teaspoon (None)  Oral - Honey Cup (None)  Oral - Honey Syringe (None)  Oral - Nectar Teaspoon (None)  Oral - Nectar Cup (None)  Oral - Nectar Straw (None)  Oral - Nectar Syringe (None)  Oral - Ice Chips (None)  Oral - Thin Teaspoon (None)  Oral - Thin Cup (None)  Oral - Thin Straw (None)  Oral - Thin Syringe (None)  Oral - Puree (None)  Oral - Mechanical Soft (None)  Oral - Regular (None)  Oral - Multi-consistency (None)  Oral - Pill (None)  Oral Phase - Comment (None)      CHL IP PHARYNGEAL PHASE 02/20/2015  Pharyngeal Phase Impaired  Pharyngeal - Pudding Teaspoon (None)  Penetration/Aspiration details (pudding teaspoon) (None)  Pharyngeal - Pudding Cup (None)  Penetration/Aspiration details (pudding cup) (None)  Pharyngeal - Honey Teaspoon (None)  Penetration/Aspiration details (honey teaspoon) (None)  Pharyngeal - Honey Cup (None)  Penetration/Aspiration details (honey cup) (None)  Pharyngeal - Honey Syringe (None)  Penetration/Aspiration details (honey syringe) (None)  Pharyngeal - Nectar Teaspoon (None)  Penetration/Aspiration details (nectar teaspoon) (None)  Pharyngeal - Nectar Cup  (None)  Penetration/Aspiration details (nectar cup) (None)  Pharyngeal - Nectar Straw (None)  Penetration/Aspiration details (nectar straw) (None)  Pharyngeal - Nectar Syringe (None)  Penetration/Aspiration details (nectar syringe) (None)  Pharyngeal - Ice Chips (None)  Penetration/Aspiration details (ice chips) (None)  Pharyngeal - Thin Teaspoon (None)  Penetration/Aspiration details (thin teaspoon) (None)  Pharyngeal - Thin Cup (None)  Penetration/Aspiration details (thin cup) (None)  Pharyngeal - Thin Straw (None)  Penetration/Aspiration details (thin straw) (None)  Pharyngeal - Thin Syringe (None)  Penetration/Aspiration details (thin syringe') (None)  Pharyngeal - Puree (None)  Penetration/Aspiration details (puree) (None)  Pharyngeal - Mechanical Soft (None)  Penetration/Aspiration details (mechanical soft) (None)  Pharyngeal - Regular (None)  Penetration/Aspiration details (regular) (None)  Pharyngeal - Multi-consistency (None)  Penetration/Aspiration details (multi-consistency) (None)  Pharyngeal - Pill (None)  Penetration/Aspiration details (pill) (None)  Pharyngeal Comment Head turn to right reduced residuals and penetration       No flowsheet data found.  No flowsheet data found.         PAYNE, [place] 02/20/2015, 2:54 PM     Recent Labs  02/21/15 0626  WBC 4.0  HGB 12.1*  HCT 35.2*  PLT 111*  Recent Labs  02/19/15 0549 02/21/15 0626  NA 139 134*  K 3.9 4.1  CL 105 101  GLUCOSE 85 94  BUN 14 12  CREATININE 0.82 0.84  CALCIUM 8.4* 7.9*   CBG (last 3)  No results for input(s): GLUCAP in the last 72 hours.  Wt Readings from Last 3 Encounters:  02/16/15 71.6 kg (157 lb 13.6 oz)  02/11/15 73.3 kg (161 lb 9.6 oz)  01/31/15 73.755 kg (162 lb 9.6 oz)    Physical Exam:  Constitutional: He is oriented to person, place, and time.  HENT:  Oral mucosa pink, moist Eyes: EOM are normal.  Neck: Normal range of motion. Neck supple. No thyromegaly present.  Cardiovascular: Normal  rate and regular rhythm. no murmurs, rubs Respiratory: Effort normal and breath sounds normal. No respiratory distress.  GI: Soft. Bowel sounds are normal. He exhibits no distension.  Neurological: He is alert and oriented to person, place, and time.  Speech is mildly slurred but intelligible. Right central 7. He is oriented to person place and date. Continued word finding deficits/occasional substitution---worse that he was just waking up. RUE: 4/5 prox to distal. Decreased FMC. RLE: 3+hf, 4/5 ke, adf/apf. LLE: 4+/5. Decreased proprioception in both legs (chronic)  Skin: Skin is warm and dry.  Psychiatric: He has a normal mood and affect. His behavior is normal    Assessment/Plan: 1. Functional deficits secondary to left basal ganglia infarct which require 3+ hours per day of interdisciplinary therapy in a comprehensive inpatient rehab setting. Physiatrist is providing close team supervision and 24 hour management of active medical problems listed below. Physiatrist and rehab team continue to assess barriers to discharge/monitor patient progress toward functional and medical goals.  Dc home today. Follow up arranged. See me in about 4-6 weeks.   FIM: FIM - Bathing Bathing Steps Patient Completed: Chest, Right Arm, Left Arm, Abdomen, Front perineal area, Buttocks, Right upper leg, Left upper leg, Right lower leg (including foot), Left lower leg (including foot) Bathing: 5: Supervision: Safety issues/verbal cues  FIM - Upper Body Dressing/Undressing Upper body dressing/undressing steps patient completed: Thread/unthread left sleeve of pullover shirt/dress, Put head through opening of pull over shirt/dress, Thread/unthread right sleeve of pullover shirt/dresss, Pull shirt over trunk Upper body dressing/undressing: 6: More than reasonable amount of time FIM - Lower Body Dressing/Undressing Lower body dressing/undressing steps patient completed: Thread/unthread right pants leg,  Thread/unthread left pants leg, Pull pants up/down, Don/Doff right sock, Don/Doff left sock, Don/Doff right shoe, Don/Doff left shoe, Thread/unthread right underwear leg, Thread/unthread left underwear leg, Pull underwear up/down Lower body dressing/undressing: 6: More than reasonable amount of time  FIM - Toileting Toileting steps completed by patient: Adjust clothing prior to toileting, Performs perineal hygiene, Adjust clothing after toileting Toileting Assistive Devices: Grab bar or rail for support Toileting: 5: Supervision: Safety issues/verbal cues  FIM - Radio producer Devices: Insurance account manager Transfers: 5-To toilet/BSC: Supervision (verbal cues/safety issues), 5-From toilet/BSC: Supervision (verbal cues/safety issues)  FIM - Control and instrumentation engineer Devices: Walker, Arm rests Bed/Chair Transfer: 6: Supine > Sit: No assist, 6: Sit > Supine: No assist, 5: Bed > Chair or W/C: Supervision (verbal cues/safety issues), 5: Chair or W/C > Bed: Supervision (verbal cues/safety issues)  FIM - Locomotion: Wheelchair Distance: 250 Locomotion: Wheelchair: 5: Travels 150 ft or more: maneuvers on rugs and over door sills with supervision, cueing or coaxing FIM - Locomotion: Ambulation Locomotion: Ambulation Assistive Devices: Other (comment) (Rollator) Ambulation/Gait Assistance: 5: Supervision Locomotion: Ambulation:  5: Travels 150 ft or more with supervision/safety issues  Comprehension Comprehension Mode: Auditory Comprehension: 6-Follows complex conversation/direction: With extra time/assistive device  Expression Expression Mode: Verbal Expression: 4-Expresses basic 75 - 89% of the time/requires cueing 10 - 24% of the time. Needs helper to occlude trach/needs to repeat words.  Social Interaction Social Interaction: 6-Interacts appropriately with others with medication or extra time (anti-anxiety, antidepressant).  Problem  Solving Problem Solving: 5-Solves basic 90% of the time/requires cueing < 10% of the time  Memory Memory: 5-Recognizes or recalls 90% of the time/requires cueing < 10% of the time  Medical Problem List and Plan: 1. Functional deficits secondary to left basal ganglia infarct 2. DVT Prophylaxis/Anticoagulation: Fixed dose of Coumadin while maintained on Revlimid-continue for now 3. Pain Management: Robaxin as needed-sx controlled currently 4. Dysphagia. Dysphagia #3 thins started yesterday without problems. Using comp strategies 5. Neuropsych: This patient is capable of making decisions on his own behalf. 6. Skin/Wound Care: skin intact 7. Fluids/Electrolytes/Nutrition: reviewed labs---lytes/renal function stable--eating well 8. History of multiple myeloma/spinal metastasis. Chemotherapy ongoing per Dr. Gwenyth Allegra weekly on Wednesdays  -has CTX on Thursday this wk 9. Aspiration pneumonia.  Lungs remain clear. No issues 10. BPH.  . Continue Flomax 11. Diarrhea: resolved  LOS (Days) 9 A FACE TO FACE EVALUATION WAS PERFORMED  Lataisha Colan T 02/21/2015 8:05 AM

## 2015-02-21 NOTE — Progress Notes (Addendum)
Social Work Discharge Note  The overall goal for the admission was met for:   Discharge location: Yes - home  Length of Stay: Yes - 9 days  Discharge activity level: Yes - supervision  Home/community participation: Yes  Services provided included: MD, RD, PT, OT, SLP, RN, Pharmacy, Neuropsych and SW  Financial Services: Medicare and Private Insurance: T[employer]are  Follow-up services arranged: Outpatient: PT/OT/ST and Patient/Family request agency: [facility], DME: no DME needed  Comments (or additional information):  Pt to d/c home with his wife to provide supervision and to help him coordinate his appointments.  Pt has all needed DME at home from previous rehab stay.  Pt will go to [place] for cancer treatments and to [facility] for his continued therapy.   Patient/Family verbalized understanding of follow-up arrangements: Yes  Individual responsible for coordination of the follow-up plan: pt with support from his wife  Confirmed correct DME delivered: Trey Sailors 02/21/2015    Jami Bogdanski, [name]

## 2015-02-21 NOTE — Progress Notes (Signed)
Social Work Patient ID: Jeremiah Owens, male   DOB: 1930/02/08, 81 y.o.   MRN: [id number]\  CSW met with pt and his wife on 02-20-15 to update them on team conference discussion.  They both feel comfortable and ready for d/c on 02-21-15.  Appointments discussed with pt's wife.  Pt asked about when IV site would come out and CSW explained that it would be day of d/c.  Pt's wife is concerned about getting a medication that is locked in the [facility] pharmacy and CSW informed RN, Dolores Lory, that pt would need that on morning of d/c, as well.  She will pass on in report.  Pt requires no DME as he has everything at home from previous rehab admission.  Pt/wife prefer outpt therapy at [facility].  CSW has scheduled this.  No other needs identified at this time.

## 2015-02-21 NOTE — Progress Notes (Signed)
Social Work Patient ID: Ahmon Tosi, male   DOB: Oct 06, 1929, 81 y.o.   MRN: [id number]   Lynnda Child, LCSW Social Worker Signed  Patient Care Conference 02/21/2015  9:31 AM    Expand All Collapse All   Inpatient RehabilitationTeam Conference and Plan of Care Update Date: 02/20/2015   Time: 2:45 PM     Patient Name: Jeremiah Owens       Medical Record Number: [id number]  Date of Birth: 08-26-1929 Sex: Male         Room/Bed: 4M07C/4M07C-01 Payor Info: Payor: MEDICARE / Plan: MEDICARE PART A AND B / Product Type: *No Product type* /    Admitting Diagnosis: CVA  Admit Date/Time:  02/12/2015  4:46 PM Admission Comments: No comment available   Primary Diagnosis:  Basal ganglia infarction Principal Problem: Basal ganglia infarction    Patient Active Problem List     Diagnosis  Date Noted   .  Basal ganglia infarction  02/12/2015   .  Dizziness     .  Expressive aphasia     .  Past pointing     .  Right leg weakness     .  Acute ischemic stroke  02/11/2015   .  CVA (cerebral infarction)  02/11/2015   .  Aspiration pneumonia  02/11/2015   .  Aphasia     .  Difficulty speaking     .  Speech difficult to understand     .  Stroke     .  Encounter for antineoplastic chemotherapy  01/31/2015   .  Constipation  12/20/2014   .  Rash  12/20/2014   .  Cellulitis  12/06/2014   .  Peripheral edema  12/06/2014   .  Gait disorder  09/05/2014   .  Multiple myeloma     .  Paraplegia  06/21/2014   .  Postoperative anemia due to acute blood loss  06/21/2014   .  Neoplasm of thoracic spine  06/20/2014   .  Spine metastasis  06/17/2014   .  Thoracic spine tumor  06/17/2014   .  Metastatic bone cancer  06/15/2014     Expected Discharge Date: Expected Discharge Date: 02/21/15  Team Members Present: Physician leading conference: Dr. [name] Social Worker Present: Alfonse Alpers, LCSW Nurse Present: Dorien Chihuahua, RN PT Present: Canary Brim, Dustin Folks, PT OT Present:  Willeen Cass, OT;Other (comment) Napoleon Form, OT) SLP Present: [place] Anna, SLP Other (Discipline and Name): Danne Baxter, RN, Admissions Coordinator PPS Coordinator present : Daiva Nakayama, RN, [facility]        Current Status/Progress  Goal  Weekly Team Focus   Medical     passed swallowing test. still language deficits   finalize dc planning  nutrition. swallowing   Bowel/Bladder     Continent of bowel and bladder. LBM 02/19/15   Pt to remain continent of bowel and bladder   Monitor   Swallow/Nutrition/ Hydration     Dys. 3 textures with thin liquids, intermittent supevision   least restrictive PO Mod I   Family Education   ADL's     supervision-mod I overall  Supervision-Mod I overall  education; discharge planning; activity tolerance    Mobility     Mod I bed mobility, S all transfers, stairs, and gait x150'   supervision overall  Family training, DC preparation   Communication     Min-Mod A  Min assist   Family Education    Safety/Cognition/ Behavioral Observations  Pain     No c/o pain  <3  Monitor for nonverbal cues of pain    Skin     Skin tear to R elbow with tegaderm intact. Sacrum red, applying barrier cream  Nio additional skin breakdown  Assess q shift     Rehab Goals Patient on target to meet rehab goals: Yes Rehab Goals Revised: none *See Care Plan and progress notes for long and short-term goals.    Barriers to Discharge:  see prior, language     Possible Resolutions to Barriers:   family ed and supervision     Discharge Planning/Teaching Needs:   Pt plans to return to his home at d/c with his wife to provide 24/7 supervision.  Pt's wife attended family education.    Team Discussion:    Pt's MBS had improved and pt's diet was upgraded to thin liquids with head turn.  Family education went well and is completed.  Pt's chemotherapy appt was changed to Thursday, as it cannot be on the day of d/c from the hospital.  Pt is on track for d/c 02-21-15.    Revisions to Treatment Plan:    none    Continued Need for Acute Rehabilitation Level of Care: The patient requires daily medical management by a physician with specialized training in physical medicine and rehabilitation for the following conditions: Daily direction of a multidisciplinary physical rehabilitation program to ensure safe treatment while eliciting the highest outcome that is of practical value to the patient.: Yes Daily medical management of patient stability for increased activity during participation in an intensive rehabilitation regime.: Yes Daily analysis of laboratory values and/or radiology reports with any subsequent need for medication adjustment of medical intervention for : Neurological problems;Other  Jayla Mackie, [name] 02/21/2015, 2:31 PM

## 2015-02-21 NOTE — Patient Care Conference (Signed)
Inpatient RehabilitationTeam Conference and Plan of Care Update Date: 02/20/2015   Time: 2:45 PM    Patient Name: Jeremiah Owens      Medical Record Number: [id number]  Date of Birth: February 06, 1930 Sex: Male         Room/Bed: 4M07C/4M07C-01 Payor Info: Payor: MEDICARE / Plan: MEDICARE PART A AND B / Product Type: *No Product type* /    Admitting Diagnosis: CVA  Admit Date/Time:  02/12/2015  4:46 PM Admission Comments: No comment available   Primary Diagnosis:  Basal ganglia infarction Principal Problem: Basal ganglia infarction  Patient Active Problem List   Diagnosis Date Noted  . Basal ganglia infarction 02/12/2015  . Dizziness   . Expressive aphasia   . Past pointing   . Right leg weakness   . Acute ischemic stroke 02/11/2015  . CVA (cerebral infarction) 02/11/2015  . Aspiration pneumonia 02/11/2015  . Aphasia   . Difficulty speaking   . Speech difficult to understand   . Stroke   . Encounter for antineoplastic chemotherapy 01/31/2015  . Constipation 12/20/2014  . Rash 12/20/2014  . Cellulitis 12/06/2014  . Peripheral edema 12/06/2014  . Gait disorder 09/05/2014  . Multiple myeloma   . Paraplegia 06/21/2014  . Postoperative anemia due to acute blood loss 06/21/2014  . 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 Spine metastasis 06/17/2014  . Thoracic spine tumor 06/17/2014  . Metastatic bone cancer 06/15/2014    Expected Discharge Date: Expected Discharge Date: 02/21/15  Team Members Present: Physician leading conference: Dr. Alger Owens Social Worker Present: Jeremiah Alpers, LCSW Nurse Present: Jeremiah Chihuahua, RN PT Present: Jeremiah Owens, Jeremiah Owens, PT OT Present: Jeremiah Owens, OT;Other (comment) Jeremiah Owens, OT) SLP Present: Jeremiah Owens, SLP Other (Discipline and Name): Jeremiah Baxter, RN, Admissions Coordinator PPS Coordinator present : Jeremiah Nakayama, RN, [facility]     Current Status/Progress Goal Weekly Team Focus  Medical   passed swallowing test. still  language deficits  finalize dc planning  nutrition. swallowing   Bowel/Bladder   Continent of bowel and bladder. LBM 02/19/15  Pt to remain continent of bowel and bladder  Monitor   Swallow/Nutrition/ Hydration   Dys. 3 textures with thin liquids, intermittent supevision  least restrictive PO Mod I   Family Education   ADL's   supervision-mod I overall  Supervision-Mod I overall  education; discharge planning; activity tolerance   Mobility   Mod I bed mobility, S all transfers, stairs, and gait x150'  supervision overall  Family training, DC preparation   Communication   Min-Mod A  Min assist   Family Education    Safety/Cognition/ Behavioral Observations            Pain   No c/o pain  <3  Monitor for nonverbal cues of pain   Skin   Skin tear to R elbow with tegaderm intact. Sacrum red, applying barrier cream  Nio additional skin breakdown  Assess q shift     Rehab Goals Patient on target to meet rehab goals: Yes Rehab Goals Revised: none *See Care Plan and progress notes for long and short-term goals.  Barriers to Discharge: see prior, language    Possible Resolutions to Barriers:  family ed and supervision    Discharge Planning/Teaching Needs:  Pt plans to return to his home at d/c with his wife to provide 24/7 supervision.  Pt's wife attended family education.   Team Discussion:  Pt's MBS had improved and pt's diet was upgraded to thin liquids with head turn.  Family education went well and is completed.  Pt's chemotherapy appt was changed to Thursday, as it cannot be on the day of d/c from the hospital.  Pt is on track for d/c 02-21-15.  Revisions to Treatment Plan:  none   Continued Need for Acute Rehabilitation Level of Care: The patient requires daily medical management by a physician with specialized training in physical medicine and rehabilitation for the following conditions: Daily direction of a multidisciplinary physical rehabilitation program to ensure safe  treatment while eliciting the highest outcome that is of practical value to the patient.: Yes Daily medical management of patient stability for increased activity during participation in an intensive rehabilitation regime.: Yes Daily analysis of laboratory values and/or radiology reports with any subsequent need for medication adjustment of medical intervention for : Neurological problems;Other  Jeremiah Owens, Jeremiah Owens 02/21/2015, 2:31 PM

## 2015-02-21 NOTE — Telephone Encounter (Signed)
Cancelled labs per 07/19 POF pt's wife is aware... KJ

## 2015-02-22 ENCOUNTER — Encounter: Payer: Medicare Other | Admitting: Speech Pathology

## 2015-02-22 ENCOUNTER — Other Ambulatory Visit: Payer: Self-pay | Admitting: Internal Medicine

## 2015-02-22 ENCOUNTER — Ambulatory Visit (HOSPITAL_BASED_OUTPATIENT_CLINIC_OR_DEPARTMENT_OTHER): Payer: Medicare Other

## 2015-02-22 DIAGNOSIS — C9000 Multiple myeloma not having achieved remission: Secondary | ICD-10-CM | POA: Diagnosis not present

## 2015-02-22 DIAGNOSIS — Z5112 Encounter for antineoplastic immunotherapy: Secondary | ICD-10-CM

## 2015-02-22 DIAGNOSIS — C419 Malignant neoplasm of bone and articular cartilage, unspecified: Secondary | ICD-10-CM

## 2015-02-22 MED ORDER — BORTEZOMIB CHEMO SQ INJECTION 3.5 MG (2.5MG/ML)
1.3000 mg/m2 | Freq: Once | INTRAMUSCULAR | Status: AC
  Administered 2015-02-22: 2.5 mg via SUBCUTANEOUS
  Filled 2015-02-22: qty 2.5

## 2015-02-22 MED ORDER — ONDANSETRON 8 MG PO TBDP
8.0000 mg | ORAL_TABLET | Freq: Once | ORAL | Status: AC
  Administered 2015-02-22: 8 mg via ORAL
  Filled 2015-02-22: qty 1

## 2015-02-22 MED ORDER — ZOLEDRONIC ACID 4 MG/100ML IV SOLN
4.0000 mg | Freq: Once | INTRAVENOUS | Status: AC
  Administered 2015-02-22: 4 mg via INTRAVENOUS
  Filled 2015-02-22: qty 100

## 2015-02-22 MED ORDER — ONDANSETRON HCL 8 MG PO TABS: 8.0000 mg | ORAL_TABLET | Freq: Once | ORAL | Status: DC

## 2015-02-22 NOTE — Consult Note (Signed)
NEUROCOGNITIVE STATUS EXAMINATION - [place]   [name] is an 81 year old, right-handed man with history of multiple myeloma on chemotherapy (Revlimid) and radiation therapy for spinal metastasis 08/21/14 - 09/04/14 as well as 11/29/14 - 12/12/14.  He was admitted on 02/11/15 with right-sided weakness and slurred speech over 2 days.  MRI of his brain revealed an acute left basal ganglia lacunar infarction as well as an old right caudate lacunar infarct.  He did not receive TPA.    Emotional Functioning:  During the clinical interview, [name] initially stated that he was "okay," but later expressed disappointments and frustrations regarding his current medical state.  He said that his biggest frustration surrounds inability to verbally express himself.  He noted that multiple people have told him that he is improving, but he is unable to see the improvement.  He also expressed disappointment about being unable to go on an upcoming trip.  Still, despite the aforementioned reactions, he said that overall, he does not feel significantly depressed and is motivated to participate in various therapies in order to help himself improve.  He noted that he recognizes that he has recovered before and he will be able to do it again.  He denied major concerns, though he acknowledged that the whole situation is worrisome.  He was unable to expand more on that due to expressive speech difficulties.  Time was spent normalizing his emotions and validating his reactions.    Consistent with his self-report, [name] responses to self-report measures of mood symptoms were suggestive of mild depressed mood at this time, but were not indicative of the presence of clinically significant anxiety.    Mental Status:  [name] total score on a very brief measure of mental status was not suggestive of the presence of significant cognitive disruption at this time (MMSE - 2  brief = 13/16).  He lost points for misstating the floor of the building that he was on and for freely recalling only 1 of 3 previously studied words after a brief delay.  Subjectively, he noted changes in expressive speech and memory.    Impressions and Recommendations:  [name] total score on a very brief measure of mental status was not suggestive of the presence of significant cognitive disruption at this time.  However, given his subjective cognitive complaints, a neuropsychological evaluation may be warranted in the future as an outpatient.  The expectations for cognitive recovery in cases of stroke were discussed with [name] and he appeared to understand.  Contact information for a neuropsychologist in his area should be included with his discharge paperwork for this purpose.  From an emotional standpoint, [name] seems to be experiencing mild depressed mood, which is an expected reaction in his circumstances.  There is no evidence to suggest the presence of significant underlying psychopathology that would warrant medication changes or other significant interventions.  Risks for development of worsening depression were discussed with [name] and he agreed to inform his care team should he develop any symptoms that would be concerning for major depression.    DIAGNOSIS:   Basal ganglia infarction  Marlane Hatcher, Psy.D.  Clinical Neuropsychologist

## 2015-02-22 NOTE — Progress Notes (Signed)
I agree with the above plan 

## 2015-02-22 NOTE — Patient Instructions (Signed)
[facility] Discharge Instructions for Patients Receiving Chemotherapy  Today you received the following chemotherapy agents: Velcade  To help prevent nausea and vomiting after your treatment, we encourage you to take your nausea medication as prescribed by your physician.   If you develop nausea and vomiting that is not controlled by your nausea medication, call the clinic.   BELOW ARE SYMPTOMS THAT SHOULD BE REPORTED IMMEDIATELY:  *FEVER GREATER THAN 100.5 F  *CHILLS WITH OR WITHOUT FEVER  NAUSEA AND VOMITING THAT IS NOT CONTROLLED WITH YOUR NAUSEA MEDICATION  *UNUSUAL SHORTNESS OF BREATH  *UNUSUAL BRUISING OR BLEEDING  TENDERNESS IN MOUTH AND THROAT WITH OR WITHOUT PRESENCE OF ULCERS  *URINARY PROBLEMS  *BOWEL PROBLEMS  UNUSUAL RASH Items with * indicate a potential emergency and should be followed up as soon as possible.  Feel free to call the clinic you have any questions or concerns. The clinic phone number is [phone].  Please show the CHEMO ALERT CARD at check-in to the Emergency Department and triage nurse.   

## 2015-02-23 ENCOUNTER — Ambulatory Visit: Payer: Medicare Other | Attending: Physical Medicine & Rehabilitation

## 2015-02-23 DIAGNOSIS — Z7409 Other reduced mobility: Secondary | ICD-10-CM | POA: Insufficient documentation

## 2015-02-23 DIAGNOSIS — R4701 Aphasia: Secondary | ICD-10-CM

## 2015-02-23 DIAGNOSIS — M6281 Muscle weakness (generalized): Secondary | ICD-10-CM | POA: Diagnosis not present

## 2015-02-23 DIAGNOSIS — R2681 Unsteadiness on feet: Secondary | ICD-10-CM | POA: Diagnosis not present

## 2015-02-23 DIAGNOSIS — R279 Unspecified lack of coordination: Secondary | ICD-10-CM | POA: Insufficient documentation

## 2015-02-23 DIAGNOSIS — R269 Unspecified abnormalities of gait and mobility: Secondary | ICD-10-CM | POA: Insufficient documentation

## 2015-02-23 DIAGNOSIS — I69359 Hemiplegia and hemiparesis following cerebral infarction affecting unspecified side: Secondary | ICD-10-CM | POA: Insufficient documentation

## 2015-02-23 DIAGNOSIS — R6889 Other general symptoms and signs: Secondary | ICD-10-CM | POA: Diagnosis not present

## 2015-02-23 DIAGNOSIS — R531 Weakness: Secondary | ICD-10-CM | POA: Diagnosis not present

## 2015-02-23 DIAGNOSIS — R1313 Dysphagia, pharyngeal phase: Secondary | ICD-10-CM | POA: Diagnosis not present

## 2015-02-23 NOTE — Therapy (Signed)
[facility] [address] [place], [place], [address] Phone: [phone]   Fax:  [phone]  Speech Language Pathology Evaluation  Patient Details  Name: Jeremiah Owens MRN: [id number] Date of Birth: 1929/11/15 Referring Provider:  Meredith Staggers, MD  Encounter Date: 02/23/2015      End of Session - 02/23/15 1344    Visit Number 1   Number of Visits 24  or 16, depending upon pt choice of BID or TID ST   Date for SLP Re-Evaluation 04/25/15   SLP Start Time 0803   SLP Stop Time  0846   SLP Time Calculation (min) 43 min   Activity Tolerance Patient tolerated treatment well      Past Medical History  Diagnosis Date  . Compression fracture   . Allergy   . Anxiety   . Thyroid disease   . S/P radiation therapy 08/21/14-09/04/14    T4-6 25Gy/57fx  . S/P radiation therapy 11/29/14-12/12/14    rt prox humerus/shoulder/scapula 25Gy/60fx  . Encounter for antineoplastic chemotherapy 01/31/2015  . Melanoma   . Bone cancer     t_spine T-5  . Prostate cancer 2002  . Skin cancer 1994    melanoma  right neck   . Multiple myeloma     Past Surgical History  Procedure Laterality Date  . Hernia repair    . Posterior cervical fusion/foraminotomy N/A 06/17/2014    Procedure: Thoracic five laminectomy for epidural tumor resection Thoracic 4-7 posterior lateral arthrodesis, segmental pedicle screw fixation.;  Surgeon: Ashok Pall, MD;  Location: [facility];  Service: Neurosurgery;  Laterality: N/A;    There were no vitals filed for this visit.  Visit Diagnosis: Expressive aphasia  Pharyngeal dysphagia      Subjective Assessment - 02/23/15 0818    Subjective "He took me up to - - the floor -- not the same one - - but - over here at a - a different place."            SLP Evaluation [facility] - 02/23/15 0818    SLP Visit Information   SLP Received On 02/23/15   Onset Date 02-10-15   Medical Diagnosis CVA   Pain  Assessment   Currently in Pain? No/denies   General Information   Other Pertinent Information 81 y.o. right handed male with history of multiple myeloma on chemotherapy on Revlimid, radiation therapy for spinal metastasis 08/21/2014-09/04/2014 as well as 11/29/2014-12/12/2014 per Dr. Lisbeth Renshaw and Dr. Earlie Server. Chronic Fixed dose Coumadin while maintained on Revlimid. Pt had thoracic spine decompression and fusion November 2015 related to multiple myeloma. Patient lives with his wife used a rolling walker to ambulate short community distances prior to admission. Presented 02/11/2015 with right sided weakness and slurred speech 2 days. MRI of the brain showed acute left basal ganglia lacunar infarction as well as old right caudate lacunar infarct. Patient had MBS on 02/11/15 and recommended Dys. 3 textures with nectar-thick liquids with head turn to the right. Patient admitted to CIR on 02/13/15 and prior to leaving CIR was upgraded to Dys III/thin with head turn to rt.    Prior Functional Status   Cognitive/Linguistic Baseline Within functional limits   Type of Home House    Lives With Spouse   Vocation Retired   Associate Professor   Overall Cognitive Status Within Functional Limits for tasks assessed   Auditory Comprehension   Overall Auditory Comprehension Appears within functional limits for tasks assessed   Verbal Expression   Overall Verbal  Expression Impaired   Automatic Speech Social Response;Name   Level of Generative/Spontaneous Verbalization Phrase   Repetition No impairment   Naming Impairment   Responsive 79-100% accurate   Confrontation 50-74% accurate   Effective Techniques Semantic cues;Sentence completion   Other Verbal Expression Comments In simple conversation, phrases noted as pt's primary utterance structure with rare short, complete senteneces, as well as occasional abandonments. This hindered listener comprehension of pt's message to 80-85%.   Oral Motor/Sensory Function   Overall  Oral Motor/Sensory Function Appears within functional limits for tasks assessed   Motor Speech   Overall Motor Speech Appears within functional limits for tasks assessed   Standardized Assessments   Standardized Assessments  [place] Naming Test-2nd edition   [place] Naming Test-2nd edition  43/60 - low normal (>42.6/60)                         SLP Education - 02/27/2015 1344    Education provided Yes   Education Details course of therapy, compensation for anomia (rate reduction)   Person(s) Educated Patient;Spouse   Methods Explanation;Demonstration   Comprehension Verbalized understanding;Need further instruction          SLP Short Term Goals - 02/27/15 [place] #1   Title pt will produce sentence responses in mod complex tasks 80% and rare min A   Time 4   Period Weeks   Status New   SLP SHORT TERM GOAL #2   Title pt will demo HEP for swallowing with rare min A   Time 4   Period Weeks   Status New   SLP SHORT TERM GOAL #3   Title pt will demo compensatory strategies for anomia with occasasional min A   Time 4   Period Weeks   Status New          SLP Long Term Goals - 02-27-2015 1356    SLP LONG TERM GOAL #1   Title pt will demo 10 minutes mod complex conversation fucntionally with modified indpendence   Time 8   Period Weeks   Status New   SLP LONG TERM GOAL #2   Title pt will demo HEP for swallowing with modified independence   Time 8   Period Weeks   Status New          Plan - 27-Feb-2015 1347    Clinical Impression Statement Pt presents with anomia tested as low-normal however pt with clear difficulty in conversation with word finding and sentence formulatoin. Pt also with dysphagia. Pt would benefit from skilled ST to address improving swallow function and reducing anomia to improve pt's QOL.   Speech Therapy Frequency 3x / week  possibly BID; pt to choose which he prefers   Duration --  8 weeks   Treatment/Interventions  Internal/external aids;Compensatory strategies;SLP instruction and feedback;Patient/family education;Functional tasks;Cueing hierarchy   Potential to Achieve Goals Good          G-Codes - 02-27-15 1358    Functional Assessment Tool Used noms   Functional Limitations Spoken language expressive   Spoken Language Expression Current Status [phone]) At least 40 percent but less than 60 percent impaired, limited or restricted   Spoken Language Expression Goal Status ([id number]) At least 1 percent but less than 20 percent impaired, limited or restricted      Problem List Patient Active Problem List   Diagnosis Date Noted  . Basal ganglia infarction 02/12/2015  . Dizziness   .  Expressive aphasia   . Past pointing   . Right leg weakness   . Acute ischemic stroke 02/11/2015  . CVA (cerebral infarction) 02/11/2015  . Aspiration pneumonia 02/11/2015  . Aphasia   . Difficulty speaking   . Speech difficult to understand   . Stroke   . Encounter for antineoplastic chemotherapy 01/31/2015  . Constipation 12/20/2014  . Rash 12/20/2014  . Cellulitis 12/06/2014  . Peripheral edema 12/06/2014  . Gait disorder 09/05/2014  . Multiple myeloma   . Paraplegia 06/21/2014  . Postoperative anemia due to acute blood loss 06/21/2014  . 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 Spine metastasis 06/17/2014  . Thoracic spine tumor 06/17/2014  . Metastatic bone cancer 06/15/2014    [facility] , MS, CCC-SLP  02/23/2015, 1:59 PM  [facility] [address] [place], [place], [address] Phone: [phone]   Fax:  [phone]

## 2015-02-23 NOTE — Patient Instructions (Signed)
  Please complete the assigned speech therapy homework and return it to your next session.  

## 2015-02-26 ENCOUNTER — Ambulatory Visit: Payer: Medicare Other

## 2015-02-26 ENCOUNTER — Ambulatory Visit: Payer: Medicare Other | Admitting: Occupational Therapy

## 2015-02-26 ENCOUNTER — Encounter: Payer: Self-pay | Admitting: Physical Therapy

## 2015-02-26 ENCOUNTER — Ambulatory Visit: Payer: Medicare Other | Admitting: Physical Therapy

## 2015-02-26 DIAGNOSIS — I69359 Hemiplegia and hemiparesis following cerebral infarction affecting unspecified side: Secondary | ICD-10-CM

## 2015-02-26 DIAGNOSIS — Z7409 Other reduced mobility: Secondary | ICD-10-CM

## 2015-02-26 DIAGNOSIS — M6281 Muscle weakness (generalized): Secondary | ICD-10-CM

## 2015-02-26 DIAGNOSIS — R4701 Aphasia: Secondary | ICD-10-CM | POA: Diagnosis not present

## 2015-02-26 DIAGNOSIS — R6889 Other general symptoms and signs: Secondary | ICD-10-CM

## 2015-02-26 DIAGNOSIS — R269 Unspecified abnormalities of gait and mobility: Secondary | ICD-10-CM

## 2015-02-26 DIAGNOSIS — R279 Unspecified lack of coordination: Secondary | ICD-10-CM

## 2015-02-26 DIAGNOSIS — R2681 Unsteadiness on feet: Secondary | ICD-10-CM

## 2015-02-26 DIAGNOSIS — R531 Weakness: Secondary | ICD-10-CM

## 2015-02-26 DIAGNOSIS — R1313 Dysphagia, pharyngeal phase: Secondary | ICD-10-CM | POA: Diagnosis not present

## 2015-02-26 NOTE — Therapy (Signed)
[facility] [address] [place], [place], [address] Phone: [phone]   Fax:  [phone]  Physical Therapy Evaluation  Patient Details  Name: Jeremiah Owens MRN: [id number] Date of Birth: [birth year] Referring Provider:  Meredith Staggers, MD  Encounter Date: 02/26/2015      PT End of Session - 02/26/15 1248    Visit Number 1   Number of Visits 17  eval + 16 visits   Date for PT Re-Evaluation 04/27/15   Authorization Type Medicare [employer] - G codes required every 10 visits   PT Start Time 1151   PT Stop Time 1230   PT Time Calculation (min) 39 min   Equipment Utilized During Treatment Gait belt   Activity Tolerance Patient tolerated treatment well   Behavior During Therapy [employer] for tasks assessed/performed      Past Medical History  Diagnosis Date  . Compression fracture   . Allergy   . Anxiety   . Thyroid disease   . S/P radiation therapy 08/21/14-09/04/14    T4-6 25Gy/81f  . S/P radiation therapy 11/29/14-12/12/14    rt prox humerus/shoulder/scapula 25Gy/169f . Encounter for antineoplastic chemotherapy 01/31/2015  . Melanoma   . Bone cancer     t_spine T-5  . Prostate cancer 2002  . Skin cancer 1994    melanoma  right neck   . Multiple myeloma     Past Surgical History  Procedure Laterality Date  . Hernia repair    . Posterior cervical fusion/foraminotomy N/A 06/17/2014    Procedure: Thoracic five laminectomy for epidural tumor resection Thoracic 4-7 posterior lateral arthrodesis, segmental pedicle screw fixation.;  Surgeon: KyAshok PallMD;  [facility];  Service: Neurosurgery;  Laterality: N/A;    There were no vitals filed for this visit.  Visit Diagnosis:  Abnormality of gait - Plan: PT plan of care cert/re-cert  Hemiparesis due to recent cerebral infarction - Plan: PT plan of care cert/re-cert  Unsteadiness - Plan: PT plan of care cert/re-cert  Weakness generalized -  Plan: PT plan of care cert/re-cert      Subjective Assessment - 02/26/15 1158    Subjective Pt reports having a "hard time with balance". Prior to CVA, pt was transitioning from using rollator to cane for mobility. Pt denies falls. Wife has had to "catch me several times" since pt was D/C'ed from hospital.    Pertinent History Basal ganglia infarct 02/10/15 with expressive aphasia; prostate cancer, skin cancer, multiple myeloma (active; chemotherapy on Wednesdays)   Limitations Walking   Patient Stated Goals "To be able to function without the walker (rollator); better balance."   Currently in Pain? No/denies            [facility] - 02/26/15 0001    Assessment   Medical Diagnosis Basal ganglia infarction   Onset Date/Surgical Date 02/10/15   Hand Dominance Right   Prior Therapy [facility] until Wednesday 02/21/15   Precautions   Precautions Fall   Restrictions   Weight Bearing Restrictions No   Balance Screen   Has the patient fallen in the past 6 months No   Has the patient had a decrease in activity level because of a fear of falling?  Yes   Is the patient reluctant to leave their home because of a fear of falling?  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; captain   Cognition   Overall Cognitive Status Impaired/Different from baseline  Pt reporting difficulty with thinking;  currently seeing SLP   Sensation   Light Touch Appears Intact   Proprioception Impaired by gross assessment   Additional Comments Per observation of functional mobility, proprioception ppears to be impaired in BLE's (impairment on RLE > LLE)   Coordination   Gross Motor Movements are Fluid and Coordinated Yes   Heel Shin Test Grossly symmetrical in B LE's   Posture/Postural Control   Posture/Postural Control Postural limitations   Postural Limitations Rounded Shoulders;Forward head;Posterior pelvic tilt;Increased thoracic kyphosis;Flexed trunk   ROM / Strength   AROM / PROM / Strength Strength   AROM   Overall AROM Comments 4/5 B hip flexion and R knee extension; 4-/5 R knee extension; 4/5 B ankle dorsii/plantar flexion   Transfers   Transfers Sit to Stand;Stand to Sit   Sit to Stand 5: Supervision   Sit to Stand Details (indicate cue type and reason) Cueing for proper use of rollator   Stand to Sit 5: Supervision   Stand to Sit Details cueing for proper use of rollator (brakes)   Ambulation/Gait   Ambulation/Gait Yes   Ambulation/Gait Assistance 5: Supervision;4: Min assist;4: Min guard   Ambulation/Gait Assistance Details With rollator, supervision during linear gait, min guard during turning; without AD, pt requires min A   Ambulation Distance (Feet) 250 Feet   Assistive device 4-wheeled walker;None   Gait Pattern Step-through pattern;Decreased dorsiflexion - right;Poor foot clearance - right;Trunk flexed;Decreased hip/knee flexion - right;Decreased weight shift to left  Decreased wt shift to L side noted without AD   Ambulation Surface Level;Indoor   Gait velocity 2.24 ft/sec   Balance   Balance Assessed Yes   Dynamic Standing Balance   Dynamic Standing - Balance Support No upper extremity supported   Dynamic Standing - Level of Assistance 4: Min assist   Dynamic Standing - Balance Activities Alternatig foot taps   Dynamic Standing - Comments Posterior preference; tendency  toward posterior LOB   Standardized Balance Assessment   Standardized Balance Assessment Timed Up and Go Test   Timed Up and Go Test   TUG Normal TUG   Normal TUG (seconds) 21.58  using rollator                           PT Education - 02/26/15 1248    Education provided Yes   Education Details Pt evaluation findings, goals, and POC.   Person(s) Educated Patient;Spouse   Methods Explanation   Comprehension Verbalized understanding          PT Short Term Goals - 02/26/15 1300    PT SHORT TERM GOAL #1   Title Pt will perform home exercises with mod I using paper handout to indicate safe HEP compliance. Target date: 03/26/15   Status New   PT SHORT TERM GOAL #2   Title Perform Berg Balance Scale to assess stability with functional standing balance.  Target date: 03/26/15   Status  New   PT SHORT TERM GOAL #3   Title Pt will decrease TUG time from 21.58 to 17.5 seconds or less to indicate improved efficiency of functional mobility.  Target date: 03/26/15   Baseline 21.58 sec with rollator   Status New   PT SHORT TERM GOAL #4   Title Pt will negotiate standard curb step with mod I using LRAD to indicate increased safety/independence with community mobility.  Target date: 03/26/15   Status New   PT SHORT TERM GOAL #5   Title Pt will ambulate at self-selected gait speed of 2.63 ft/sec to reach status of community ambulator. Target date: 03/26/15   Status New   Additional Short Term Goals   Additional Short Term Goals Yes   PT SHORT TERM GOAL #6   Title Pt will perform sit > stand from standard chair without UE support to indicate increased functional LE strength.  Target date: 03/26/15   PT SHORT TERM GOAL #7   Title _________________________________________________________________________   PT SHORT TERM GOAL #8   Title _________________________________________________________________   PT SHORT TERM GOAL #9   TITLE  ____________________________________________________________________________________           PT Long Term Goals - 02/26/15 1309    PT LONG TERM GOAL #1   Title Pt/spouse will verbalize understanding of CVA warning signs to indicate understanding of situation in which to seek medical attention. Target date: 04/23/15.   Baseline ____________________________   Status New   PT LONG TERM GOAL #2   Title Pt will ambulate x200' over indoor, level surfaces with mod I, increased time without AD to indicate increased pt independence with household mobility, progress toward pt-stated goal. Target date: 04/23/15.   Baseline ________________________________   Status New   PT LONG TERM GOAL #3   Title Pt will perform TUG test in < 13.5 sconds to indicate decreased risk of falling. Target date: 04/23/15.   Baseline 21.5 seconds with rollator   Status New   PT LONG TERM GOAL #4   Title Pt will increase Berg Balance Scale score by 6 points from baseline to indicate improvement in functional standing balance. Target date: 04/23/15.   Baseline ____________________________   Status New   PT LONG TERM GOAL #5   Title Pt will ambulate x500' over outdoor, unlevel surfaces with mod I using LRAD with no overt LOB to indicate increased safety with community mobility. Target date: 04/23/15.   Baseline --   Status New   PT LONG TERM GOAL #6   Title --   Baseline ___________________________________________               Plan - 02/26/15 1252    Clinical Impression Statement Pt is an 81 y/o M presenting to outpatient PT with functional limitations secondary to basal ganglia infarct on 02/10/15. Pt was hospitalized from 7/9 - 02/21/15 (including inpatient rehab) prior to discharge home at supervision level for functional mobility. Prior to CVA, pt was transitioning from using rollator to using straight cane for mobility. PT evaluation reveals the following impairments: decreased BLE strength (impairment on R >  LLE); decreased postural stability/control, as exhibited by posterior preference/frequent posterior LOB when balance challenged in standing; abnormal gait; TUG time indicative of increased fall risk. Pt will benefit from skilled PT 2x/week for up to 8 weeks to address said impairments.   Pt will benefit from skilled therapeutic intervention in order to improve on the following deficits Abnormal gait;Decreased balance;Difficulty walking;Decreased strength;Postural dysfunction;Decreased knowledge of use of DME;Decreased  activity tolerance;Decreased endurance;Other (comment)  impaired proprioception; impaired postural stability/control   Clinical Impairments Affecting Rehab Potential Currently undergoing chemotherapy 1x/week   PT Frequency 2x / week   PT Duration 8 weeks   PT Treatment/Interventions ADLs/Self Care Home Management;Gait training;Therapeutic exercise;Patient/family education;Stair training;Balance training;Functional mobility training;Neuromuscular re-education;Manual techniques;Therapeutic activities;DME Instruction;Vestibular;Orthotic Fit/Training   PT Next Visit Plan Perform Berg; establish HEP. Review safe/proper use of rollator for transfers (focus on locking brakes) and with community mobility (curb step negotiation)   Consulted and Agree with Plan of Care Patient;Family member/caregiver   Family Member Consulted wife, Andrez Grime - 2015/03/21 1250    Functional Assessment Tool Used TUG 21.58 sec with rollator   Functional Limitation Mobility: Walking and moving around   Mobility: Walking and Moving Around Current Status [phone]) At least 20 percent but less than 40 percent impaired, limited or restricted   Mobility: Walking and Moving Around Goal Status [phone]) At least 1 percent but less than 20 percent impaired, limited or restricted       Problem List Patient Active Problem List   Diagnosis Date Noted  . Basal ganglia infarction 02/12/2015  . Dizziness   .  Expressive aphasia   . Past pointing   . Right leg weakness   . Acute ischemic stroke 02/11/2015  . CVA (cerebral infarction) 02/11/2015  . Aspiration pneumonia 02/11/2015  . Aphasia   . Difficulty speaking   . Speech difficult to understand   . Stroke   . Encounter for antineoplastic chemotherapy 01/31/2015  . Constipation 12/20/2014  . Rash 12/20/2014  . Cellulitis 12/06/2014  . Peripheral edema 12/06/2014  . Gait disorder 09/05/2014  . Multiple myeloma   . Paraplegia 06/21/2014  . Postoperative anemia due to acute blood loss 06/21/2014  . 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 Spine metastasis 06/17/2014  . Thoracic spine tumor 06/17/2014  . Metastatic bone cancer 06/15/2014    Billie Ruddy, PT, DPT [facility] [address] [place], [place], [address] Phone: [phone]   Fax:  [phone] 03-21-2015, 2:42 PM

## 2015-02-26 NOTE — Therapy (Signed)
[facility] [address] [place], [place], [address] Phone: [phone]   Fax:  [phone]  Occupational Therapy Evaluation  Patient Details  Name: Jeremiah Owens MRN: [id number] Date of Birth: 14-Jan-1930 Referring Provider:  Wenda Low, MD  Encounter Date: 02/26/2015      OT End of Session - 02/26/15 1340    Visit Number 1   Number of Visits 9   Date for OT Re-Evaluation 03/29/15   Authorization Type MCR - G CODE NEEDED   Authorization - Visit Number 1   Authorization - Number of Visits 10   OT Start Time 9675   OT Stop Time 1325   OT Time Calculation (min) 50 min   Activity Tolerance Patient tolerated treatment well      Past Medical History  Diagnosis Date  . Compression fracture   . Allergy   . Anxiety   . Thyroid disease   . S/P radiation therapy 08/21/14-09/04/14    T4-6 25Gy/56f  . S/P radiation therapy 11/29/14-12/12/14    rt prox humerus/shoulder/scapula 25Gy/112f . Encounter for antineoplastic chemotherapy 01/31/2015  . Melanoma   . Bone cancer     t_spine T-5  . Prostate cancer 2002  . Skin cancer 1994    melanoma  right neck   . Multiple myeloma     Past Surgical History  Procedure Laterality Date  . Hernia repair    . Posterior cervical fusion/foraminotomy N/A 06/17/2014    Procedure: Thoracic five laminectomy for epidural tumor resection Thoracic 4-7 posterior lateral arthrodesis, segmental pedicle screw fixation.;  Surgeon: KyAshok PallMD;  [facility];  Service: Neurosurgery;  Laterality: N/A;    There were no vitals filed for this visit.  Visit Diagnosis:  Lack of coordination - Plan: Ot plan of care cert/re-cert  Decreased functional mobility and endurance - Plan: Ot plan of care cert/re-cert  Activity intolerance - Plan: Ot plan of care cert/re-cert  Generalized muscle weakness - Plan: Ot plan of care cert/re-cert      Subjective Assessment - 02/26/15 1243     Pertinent History Basal Ganglia stroke 02/10/15, active CA, LUE shoulder impairments premorbidly approx. 75%    Limitations actively getting chemotherapy   Patient Stated Goals get my arm and hand better   Currently in Pain? No/denies           [facility] - 02/26/15 1247    Assessment   Diagnosis Basal ganglia stroke  previously: metastatic prostate CA to thoracic spine T5   Onset Date 02/10/15  in hospital 02/10/15 - 02/21/15   Assessment Pt with Rt sided weakness from stroke (Pt also had decompression and fusion T4-7 06/17/14)   Prior Therapy outpatient therapies 09/04/14 - 12/28/14   Precautions   Precautions Fall  on active chemo, no heat modalities   Restrictions   Weight Bearing Restrictions No   Balance Screen   Has the patient fallen in the past 6 months No   Has the patient had a decrease in activity level because of a fear of falling?  Yes   Is the patient reluctant to leave their home because of a fear of falling?  Yes  seeing P.T. currently   Home  Environment   [employer] Additional Comments 4W walker, rollator, w/c, BSC (doesn't use), fold down shower seat   Lives With Spouse  1 story home, ramp to enter   Prior Function   Level of Independence Independent with  basic ADLs;Independent with household mobility with device;Independent with community mobility without device;Requires assistive device for independence   Vocation Retired   [employer] Retired from [employer] years; captain   ADL   ADL comments Pt still able to eat and groom with Rt dominant hand. Pt mod I with dressing and bathing. Supervision to [facility]  for shower transfer. Supervision for toilet transfer, independent for toileting. Dependent for cooking, but had returned to some cooking prior to CVA (mostly meats, grill cooking). Pt has unloaded dishwasher and made coffee since CVA.    Mobility   Mobility Status --  Using rollator   Written Expression    Dominant Hand Right   Handwriting 90% legible  name only   Vision - History   Baseline Vision Bifocals   Additional Comments denies change   Activity Tolerance   Activity Tolerance Tolerates 10-20 min activity with muiltiple rests   Sensation   Additional Comments denies change in UE's   Coordination   9 Hole Peg Test Right;Left   Right 9 Hole Peg Test 38.78 sec   Left 9 Hole Peg Test 28.34 sec   ROM / Strength   AROM / PROM / Strength AROM;Strength   AROM   Overall AROM Comments Lt shoulder severely limited premorbidly (6-7 years ago), elbow distally WFL's. RUE AROM WFL's   Strength   Overall Strength Comments Pt overall deconditioned with generalized weakness and decreased endurance   Hand Function   Right Hand Grip (lbs) 55   Left Hand Grip (lbs) 53                           OT Short Term Goals - 10/31/14 1552    OT SHORT TERM GOAL #1   Title Independent w/ initial HEP for Lt shoulder (due 10/03/14)   Status Achieved   OT SHORT TERM GOAL #2   Title Pt to perform 10 min . of UE exercise w/o rest    Status Achieved   OT SHORT TERM GOAL #3   Title Pt to perform cooking task w/ supervision only using walker w/o LOB   Status Achieved           OT Long Term Goals - 02/26/15 1345    OT LONG TERM GOAL #1   Title Independent w/ coordination HEP   Time 4   Period Weeks   Status New   OT LONG TERM GOAL #2   Title Pt to perform dynamic standing tasks for 10 minutes or greater w/o LOB or rest break in prep for ADLS   Time 4   Period Weeks   Status New   OT LONG TERM GOAL #3   Title Pt to write check with 75% or greater legibility   Time 4   Period Weeks   Status New   OT LONG TERM GOAL #4   Title Improve coordination as evidenced by performing 9 hole peg test in 32 sec. or less Rt hand   Baseline 38.78 sec   Time 4   Period Weeks   Status New   OT LONG TERM GOAL #5   Title Pt to perform simple cooking activity safely mod I level for 15 min. or  greater    Time 4   Period Weeks   Status New               Plan - 02/26/15 1341    Clinical Impression Statement Pt returns  today for O.T. evaluation s/p basal ganglia infarction with new Rt sided weakness. Pt with h/o CA, spinal fusion, and still actively on chemotherapy. Pt was seen in outpatient O.T. but d/c approx. 3 months ago (end of March) secondary to new lesions in spine and RUE.    Pt will benefit from skilled therapeutic intervention in order to improve on the following deficits (Retired) Decreased coordination;Decreased endurance;Decreased Surveyor, mining;Improper body mechanics;Decreased activity tolerance;Decreased knowledge of precautions;Decreased balance;Impaired UE functional use;Decreased strength;Decreased mobility   Rehab Potential Good   Clinical Impairments Affecting Rehab Potential active CA on chemo   OT Frequency 2x / week   OT Duration 4 weeks  plus evaluation   OT Treatment/Interventions Self-care/ADL training;DME and/or AE instruction;Patient/family education;Therapeutic exercises;Therapeutic activities;Functional Mobility Training;Neuromuscular education;Passive range of motion;Manual Therapy;Cognitive remediation/compensation   Plan coordination HEP, strength/endurance          G-Codes - 03/25/15 1349    Functional Assessment Tool Used 9 hole peg test Rt 38.78 sec.    Functional Limitation Carrying, moving and handling objects   Carrying, Moving and Handling Objects Current Status [phone]) At least 20 percent but less than 40 percent impaired, limited or restricted   Carrying, Moving and Handling Objects Goal Status ([id number]) At least 1 percent but less than 20 percent impaired, limited or restricted      Problem List Patient Active Problem List   Diagnosis Date Noted  . Basal ganglia infarction 02/12/2015  . Dizziness   . Expressive aphasia   . Past pointing   . Right leg weakness   . Acute ischemic stroke 02/11/2015  . CVA (cerebral  infarction) 02/11/2015  . Aspiration pneumonia 02/11/2015  . Aphasia   . Difficulty speaking   . Speech difficult to understand   . Stroke   . Encounter for antineoplastic chemotherapy 01/31/2015  . Constipation 12/20/2014  . Rash 12/20/2014  . Cellulitis 12/06/2014  . Peripheral edema 12/06/2014  . Gait disorder 09/05/2014  . Multiple myeloma   . Paraplegia 06/21/2014  . Postoperative anemia due to acute blood loss 06/21/2014  . 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 Spine metastasis 06/17/2014  . Thoracic spine tumor 06/17/2014  . Metastatic bone cancer 06/15/2014    Carey Bullocks, OTR/L March 25, 2015, 1:53 PM  [facility] [address] [place], [place], [address] Phone: [phone]   Fax:  [phone]

## 2015-02-27 ENCOUNTER — Other Ambulatory Visit: Payer: Self-pay | Admitting: Medical Oncology

## 2015-02-27 DIAGNOSIS — C419 Malignant neoplasm of bone and articular cartilage, unspecified: Secondary | ICD-10-CM

## 2015-02-28 ENCOUNTER — Encounter: Payer: Self-pay | Admitting: Internal Medicine

## 2015-02-28 ENCOUNTER — Telehealth: Payer: Self-pay | Admitting: *Deleted

## 2015-02-28 ENCOUNTER — Ambulatory Visit (HOSPITAL_BASED_OUTPATIENT_CLINIC_OR_DEPARTMENT_OTHER): Payer: Medicare Other | Admitting: Internal Medicine

## 2015-02-28 ENCOUNTER — Telehealth: Payer: Self-pay | Admitting: Internal Medicine

## 2015-02-28 ENCOUNTER — Other Ambulatory Visit (HOSPITAL_BASED_OUTPATIENT_CLINIC_OR_DEPARTMENT_OTHER): Payer: Medicare Other

## 2015-02-28 ENCOUNTER — Ambulatory Visit (HOSPITAL_BASED_OUTPATIENT_CLINIC_OR_DEPARTMENT_OTHER): Payer: Medicare Other

## 2015-02-28 ENCOUNTER — Other Ambulatory Visit: Payer: Self-pay | Admitting: Medical Oncology

## 2015-02-28 VITALS — BP 122/61 | HR 77 | Temp 98.2°F | Resp 16 | Ht 70.0 in | Wt 156.9 lb

## 2015-02-28 DIAGNOSIS — Z5112 Encounter for antineoplastic immunotherapy: Secondary | ICD-10-CM | POA: Diagnosis present

## 2015-02-28 DIAGNOSIS — C9000 Multiple myeloma not having achieved remission: Secondary | ICD-10-CM

## 2015-02-28 DIAGNOSIS — D492 Neoplasm of unspecified behavior of bone, soft tissue, and skin: Secondary | ICD-10-CM

## 2015-02-28 DIAGNOSIS — R4701 Aphasia: Secondary | ICD-10-CM

## 2015-02-28 DIAGNOSIS — C419 Malignant neoplasm of bone and articular cartilage, unspecified: Secondary | ICD-10-CM

## 2015-02-28 DIAGNOSIS — I639 Cerebral infarction, unspecified: Secondary | ICD-10-CM

## 2015-02-28 LAB — COMPREHENSIVE METABOLIC PANEL (CC13)
ALBUMIN: 3.4 g/dL — AB (ref 3.5–5.0)
ALK PHOS: 58 U/L (ref 40–150)
ALT: 21 U/L (ref 0–55)
AST: 16 U/L (ref 5–34)
Anion Gap: 6 mEq/L (ref 3–11)
BUN: 14 mg/dL (ref 7.0–26.0)
CHLORIDE: 102 meq/L (ref 98–109)
CO2: 31 mEq/L — ABNORMAL HIGH (ref 22–29)
CREATININE: 1 mg/dL (ref 0.7–1.3)
Calcium: 8.8 mg/dL (ref 8.4–10.4)
EGFR: 74 mL/min/{1.73_m2} — AB (ref >90)
GLUCOSE: 102 mg/dL (ref 70–140)
Potassium: 4.2 mEq/L (ref 3.5–5.1)
Sodium: 140 mEq/L (ref 136–145)
TOTAL PROTEIN: 5.5 g/dL — AB (ref 6.4–8.3)
Total Bilirubin: 0.68 mg/dL (ref 0.20–1.20)

## 2015-02-28 LAB — CBC WITH DIFFERENTIAL/PLATELET
BASO%: 0.7 % (ref 0.0–2.0)
BASOS ABS: 0 10*3/uL (ref 0.0–0.1)
EOS ABS: 0.6 10*3/uL — AB (ref 0.0–0.5)
EOS%: 20.3 % — AB (ref 0.0–7.0)
HEMATOCRIT: 38.1 % — AB (ref 38.4–49.9)
HGB: 12.6 g/dL — ABNORMAL LOW (ref 13.0–17.1)
LYMPH%: 15.9 % (ref 14.0–49.0)
MCH: 31.8 pg (ref 27.2–33.4)
MCHC: 33 g/dL (ref 32.0–36.0)
MCV: 96.2 fL (ref 79.3–98.0)
MONO#: 0.4 10*3/uL (ref 0.1–0.9)
MONO%: 14.6 % — ABNORMAL HIGH (ref 0.0–14.0)
NEUT#: 1.4 10*3/uL — ABNORMAL LOW (ref 1.5–6.5)
NEUT%: 48.5 % (ref 39.0–75.0)
Platelets: 88 10*3/uL — ABNORMAL LOW (ref 140–400)
RBC: 3.96 10*6/uL — ABNORMAL LOW (ref 4.20–5.82)
RDW: 17.9 % — ABNORMAL HIGH (ref 11.0–14.6)
WBC: 2.8 10*3/uL — ABNORMAL LOW (ref 4.0–10.3)
lymph#: 0.5 10*3/uL — ABNORMAL LOW (ref 0.9–3.3)

## 2015-02-28 MED ORDER — BORTEZOMIB CHEMO SQ INJECTION 3.5 MG (2.5MG/ML)
1.3000 mg/m2 | Freq: Once | INTRAMUSCULAR | Status: AC
  Administered 2015-02-28: 2.5 mg via SUBCUTANEOUS
  Filled 2015-02-28: qty 2.5

## 2015-02-28 MED ORDER — LENALIDOMIDE 25 MG PO CAPS: 25.0000 mg | ORAL_CAPSULE | Freq: Every day | ORAL | Status: DC

## 2015-02-28 MED ORDER — ONDANSETRON 8 MG PO TBDP
8.0000 mg | ORAL_TABLET | Freq: Once | ORAL | Status: AC
  Administered 2015-02-28: 8 mg via ORAL
  Filled 2015-02-28: qty 1

## 2015-02-28 NOTE — Progress Notes (Signed)
Okay to treat with labs 02/28/15; platelets, 88 ANC 1.4; per Diane, Dr. Lew Dawes Nurse.

## 2015-02-28 NOTE — Progress Notes (Signed)
Per Dr Mohamed it is okay to treat pt today with chemo and todays labs.  

## 2015-02-28 NOTE — Progress Notes (Signed)
[place] [phone]   Fax:([phone]) [place] [address] [employer] [address] [place] [address]  DIAGNOSIS: Multiple myeloma diagnosed in November 2015  PRIOR THERAPY:  1) Status post Thoracic five laminectomy for epidural tumor resection, Thoracic 4-7 posterior lateral arthrodesis,  segmental pedicle screw fixation T4-T7 Globus instrumentation under the care of Dr. Christella Noa on 06/18/2014. 2) Systemic chemotherapy with Velcade 1.3 MG/M2 subcutaneously weekly in addition to Decadron 40 mg by mouth on weekly basis. Status post 11 cycles. 3) palliative radiotherapy to the right proximal humerus, shoulder and scapula under the care of Dr. Lisbeth Renshaw completed on 12/12/2014.  CURRENT THERAPY:  1) Systemic chemotherapy with Velcade 1.3 MG/M2 subcutaneously weekly, Decadron 40 mg by mouth weekly in addition to Revlimid 25 MG by mouth daily for 21 days every 4 weeks, status post 5 cycle. 2) Zometa 4 mg IV every month. First dose 11/01/2014.  INTERVAL HISTORY: Ralph Brouwer 81 y.o. male returns to the clinic today for follow-up visit accompanied by his wife and daughter-in-law. The patient is currently on treatment with weekly subcutaneous Velcade, Revlimid and Decadron status post 5 cycle and tolerating the treatment fairly well. He was admitted to [facility] last month complaining of right sided weakness as well as slurred speech for 2 days. MRI of the brain at that time showed acute left basal ganglia lacunar infarct. He was seen by neurology and started on aspirin 325 mg by mouth daily in addition to Coumadin. He was later transferred to the inpatient rehabilitation Center for physical therapy. He is currently at home and feeling little bit better. He still has a problem with expressive aphasia. He also has more fatigue and weakness. He denied having any significant nausea or vomiting, no fever or chills. He denied having  any significant weight loss or night sweats. He denied having any significant chest pain, shortness of breath, cough or hemoptysis. He is here today for evaluation and to resume his treatment with subcutaneous Velcade, Revlimid and Decadron.  MEDICAL HISTORY: Past Medical History  Diagnosis Date  . Compression fracture   . Allergy   . Anxiety   . Thyroid disease   . S/P radiation therapy 08/21/14-09/04/14    T4-6 25Gy/70f  . S/P radiation therapy 11/29/14-12/12/14    rt prox humerus/shoulder/scapula 25Gy/19f . Encounter for antineoplastic chemotherapy 01/31/2015  . Melanoma   . Bone cancer     t_spine T-5  . Prostate cancer 2002  . Skin cancer 1994    melanoma  right neck   . Multiple myeloma     ALLERGIES:  is allergic to iodine.  MEDICATIONS:  Current Outpatient Prescriptions  Medication Sig Dispense Refill  . acyclovir (ZOVIRAX) 400 MG tablet Take 1 tablet (400 mg total) by mouth 2 (two) times daily. 60 tablet 2  . aspirin EC 325 MG tablet Take 1 tablet (325 mg total) by mouth daily.    . Calcium Carbonate-Vitamin D (CALTRATE 600+D PO) Take 600 mg by mouth 2 (two) times daily.    . cholecalciferol (VITAMIN D) 1000 UNITS tablet Take 1,000 Units by mouth at bedtime.     . Marland Kitchenexamethasone (DECADRON) 4 MG tablet 10 TAB EVERY WEEK, START WITH CHEMO 40 tablet 4  . HYDROcodone-acetaminophen (NORCO) 5-325 MG per tablet Take 1 tablet by mouth every 6 (six) hours as needed for moderate pain. 60 tablet 0  . KLOR-CON M10 10 MEQ tablet Take 10 mEq by mouth  daily.  3  . lenalidomide (REVLIMID) 25 MG capsule Take 1 capsule (25 mg total) by mouth daily. Take one capsule ( 25 mg) by mouth for 21 days every 4 weeks-01/31/15 Authorization number=[id number]  adult male 21 capsule 0  . levothyroxine (SYNTHROID, LEVOTHROID) 25 MCG tablet Take 1 tablet (25 mcg total) by mouth daily. 30 tablet 4  . Multiple Vitamins-Minerals (CENTRUM SILVER PO) Take 1 tablet by mouth daily.    Marland Kitchen omeprazole (PRILOSEC) 40 MG  capsule Take 40 mg by mouth daily.    . ranitidine (ZANTAC) 150 MG tablet Take 150 mg by mouth daily as needed for heartburn.   11  . tamsulosin (FLOMAX) 0.4 MG CAPS capsule Take 1 capsule (0.4 mg total) by mouth at bedtime. 30 capsule 3  . warfarin (COUMADIN) 2 MG tablet Take 1 tablet (2 mg total) by mouth daily. 30 tablet 3  . methocarbamol (ROBAXIN) 500 MG tablet TAKE 1 TABLET (500 MG TOTAL) BY MOUTH EVERY 6 (SIX) HOURS AS NEEDED FOR MUSCLE SPASMS. 60 tablet 0  . prochlorperazine (COMPAZINE) 10 MG tablet Take 10 mg by mouth daily.  0  . saccharomyces boulardii (FLORASTOR) 250 MG capsule Take 1 capsule (250 mg total) by mouth 2 (two) times daily. 60 capsule 1  . sennosides-docusate sodium (SENOKOT-S) 8.6-50 MG tablet Take 1-2 tablets by mouth 2 (two) times daily as needed for constipation. Takes 1-2 tabs daily     No current facility-administered medications for this visit.    SURGICAL HISTORY:  Past Surgical History  Procedure Laterality Date  . Hernia repair    . Posterior cervical fusion/foraminotomy N/A 06/17/2014    Procedure: Thoracic five laminectomy for epidural tumor resection Thoracic 4-7 posterior lateral arthrodesis, segmental pedicle screw fixation.;  Surgeon: Ashok Pall, MD;  Location: [facility];  Service: Neurosurgery;  Laterality: N/A;    REVIEW OF SYSTEMS:  Constitutional: positive for fatigue Eyes: negative Ears, nose, mouth, throat, and face: negative Respiratory: negative Cardiovascular: negative Gastrointestinal: negative Genitourinary:negative Integument/breast: negative Hematologic/lymphatic: negative Musculoskeletal:positive for bone pain and muscle weakness Neurological: negative Behavioral/Psych: negative Endocrine: negative Allergic/Immunologic: negative   PHYSICAL EXAMINATION: General appearance: alert, cooperative, fatigued and no distress Head: Normocephalic, without obvious abnormality, atraumatic Neck: no adenopathy, no JVD, supple,  symmetrical, trachea midline and thyroid not enlarged, symmetric, no tenderness/mass/nodules Lymph nodes: Cervical, supraclavicular, and axillary nodes normal. Resp: clear to auscultation bilaterally Back: symmetric, no curvature. ROM normal. No CVA tenderness. Cardio: regular rate and rhythm, S1, S2 normal, no murmur, click, rub or gallop GI: soft, non-tender; bowel sounds normal; no masses,  no organomegaly Extremities: edema 2+ edema bilaterally with erythema on the legs Neurologic: Alert and oriented X 3, normal strength and tone. Normal symmetric reflexes. Normal coordination and gait Motor: grossly normal Weakness in the left foot  ECOG PERFORMANCE STATUS: 1 - Symptomatic but completely ambulatory  Blood pressure 122/61, pulse 77, temperature 98.2 F (36.8 C), temperature source Oral, resp. rate 16, height '5\' 10"'  (1.778 m), weight 156 lb 14.4 oz (71.169 kg), SpO2 100 %.  LABORATORY DATA: Lab Results  Component Value Date   WBC 2.8* 02/28/2015   HGB 12.6* 02/28/2015   HCT 38.1* 02/28/2015   MCV 96.2 02/28/2015   PLT 88* 02/28/2015      Chemistry      Component Value Date/Time   NA 140 02/28/2015 1018   NA 134* 02/21/2015 0626   K 4.2 02/28/2015 1018   K 4.1 02/21/2015 0626   CL 101 02/21/2015 6384  CO2 31* 02/28/2015 1018   CO2 26 02/21/2015 0626   BUN 14.0 02/28/2015 1018   BUN 12 02/21/2015 0626   CREATININE 1.0 02/28/2015 1018   CREATININE 0.84 02/21/2015 0626      Component Value Date/Time   CALCIUM 8.8 02/28/2015 1018   CALCIUM 7.9* 02/21/2015 0626   ALKPHOS 58 02/28/2015 1018   ALKPHOS 48 02/21/2015 0626   AST 16 02/28/2015 1018   AST 17 02/21/2015 0626   ALT 21 02/28/2015 1018   ALT 25 02/21/2015 0626   BILITOT 0.68 02/28/2015 1018   BILITOT 1.1 02/21/2015 0626       RADIOGRAPHIC STUDIES: Dg Chest 2 View  02/11/2015   CLINICAL DATA:  Weakness  EXAM: CHEST  2 VIEW  COMPARISON:  06/17/2014  FINDINGS: Subpleural opacity in the right upper lobe is  asymmetric and could reflect consolidation/pneumonia. This was not seen on scout from 10/17/2014 thoracic spine CT, arguing against neoplastic process. Chronic cardiomegaly. Negative aortic and hilar contours. Ventilation is low.  Multiple myeloma with upper thoracic posterior fixation.  IMPRESSION: 1. Subtle opacity in the right upper lobe, potentially pneumonia. Recommend 3-4 week follow-up after treatment. 2. Hypoventilation.   Electronically Signed   By: Monte Fantasia M.D.   On: 02/11/2015 01:36   Ct Head Wo Contrast  02/11/2015   CLINICAL DATA:  Difficulty with speech with dizziness. Chronic right leg weakness.  EXAM: CT HEAD WITHOUT CONTRAST  TECHNIQUE: Contiguous axial images were obtained from the base of the skull through the vertex without intravenous contrast.  COMPARISON:  None.  FINDINGS: Skull and Sinuses:Negative for fracture or destructive process. The mastoids, middle ears, and imaged paranasal sinuses are clear.  Orbits: No acute abnormality.  Brain: No evidence of acute gray matter infarction, hemorrhage, hydrocephalus, or mass lesion/mass effect. There is generalized brain atrophy with ventriculomegaly, mild. Chronic small-vessel disease with patchy ischemic gliosis throughout the bilateral cerebral white matter. There is ischemic change in the right putamen, likely chronic based on history.  IMPRESSION: 1. No intracranial hemorrhage or definite infarct. 2. Moderate chronic small vessel disease, which could obscure a recent white matter infarct.   Electronically Signed   By: Monte Fantasia M.D.   On: 02/11/2015 00:20   Mr Brain Wo Contrast  02/11/2015   CLINICAL DATA:  Acute on chronic RIGHT leg weakness, new onset garbled speech. History of melanoma, prostate cancer, stroke.  EXAM: MRI HEAD WITHOUT CONTRAST  MRA HEAD WITHOUT CONTRAST  TECHNIQUE: Multiplanar, multiecho pulse sequences of the brain and surrounding structures were obtained without intravenous contrast. Angiographic images  of the head were obtained using MRA technique without contrast.  COMPARISON:  CT of the head February 10, 2015  FINDINGS: MRI HEAD FINDINGS  18 x 9 mm area of reduced diffusion in LEFT basal ganglia (lenticular striate nucleus), corresponding low ADC value consistent with acute ischemia. Corresponding mild FLAIR hyperintense signal. No susceptibility artifact to suggest hemorrhage.  Ventricles and sulci are normal for patient's age. Patchy to confluent supratentorial, pontine white matter T2 hyperintensities without midline shift, mass effect, mass lesions. Innumerable tiny T2 hyperintensities within the basal ganglia compatible with perivascular spaces. In addition, small old RIGHT caudate body lacunar infarct. No abnormal extra-axial fluid collections. Normal major intracranial vascular flow voids seen at the skull base.  Ocular globes and orbital contents are unremarkable. Mild paranasal sinus mucosal thickening without air-fluid levels. Trace LEFT mastoid effusion.  MRA HEAD FINDINGS  Anterior circulation: Normal flow related enhancement of the included cervical, petrous, cavernous  and supra clinoid internal carotid arteries. Patent anterior communicating artery. Normal flow related enhancement of the anterior and middle cerebral arteries, including more distal segments.  No large vessel occlusion, high-grade stenosis, aneurysm. Mild middle cerebral artery luminal irregularity.  Posterior circulation: Codominant vertebral artery's. Basilar artery is patent, with normal flow related enhancement of the main branch vessels. Normal flow related enhancement of the posterior cerebral arteries. Small LEFT posterior communicating artery is present.  No large vessel occlusion, high-grade stenosis, mild luminal irregularity of the posterior cerebral arteries. No aneurysm.  IMPRESSION: MRI HEAD: Acute 18 x 9 mm LEFT basal ganglia lacunar infarct.  Involutional changes. Moderate white matter changes compatible chronic small  vessel ischemic disease. Old RIGHT caudate lacunar infarct.  MRA HEAD: No acute large vessel occlusion or high-grade stenosis.  Mild luminal irregularity intracranial vessels compatible with atherosclerosis.   Electronically Signed   By: Elon Alas M.D.   On: 02/11/2015 06:23   Dg Swallowing Func-speech Pathology  02/20/2015    Objective Swallowing Evaluation:    Patient Details  Name: Jasmeet Gehl MRN: [id number] Date of Birth: 1930/05/14  Today's Date: 02/20/2015 Time: SLP Start Time (ACUTE ONLY): 0900-SLP Stop Time (ACUTE ONLY): 0930 SLP Time Calculation (min) (ACUTE ONLY): 30 min  Past Medical History:  Past Medical History  Diagnosis Date  . Compression fracture   . Allergy   . Anxiety   . Thyroid disease   . S/P radiation therapy 08/21/14-09/04/14    T4-6 25Gy/76f  . S/P radiation therapy 11/29/14-12/12/14    rt prox humerus/shoulder/scapula 25Gy/148f . Encounter for antineoplastic chemotherapy 01/31/2015  . Melanoma   . Bone cancer     t_spine T-5  . Prostate cancer 2002  . Skin cancer 1994    melanoma  right neck   . Multiple myeloma    Past Surgical History:  Past Surgical History  Procedure Laterality Date  . Hernia repair    . Posterior cervical fusion/foraminotomy N/A 06/17/2014    Procedure: Thoracic five laminectomy for epidural tumor resection  Thoracic 4-7 posterior lateral arthrodesis, segmental pedicle screw  fixation.;  Surgeon: KyAshok PallMD;  [facility];  Service:  Neurosurgery;  Laterality: N/A;   HPI:  Other Pertinent Information: [age].o. right handed male with history of  multiple myeloma on chemotherapy on Revlimid, radiation therapy for spinal  metastasis 08/21/2014-09/04/2014 as well as 11/29/2014-12/12/2014 per Dr.  MoLisbeth Renshawnd Dr. MoEarlie ServerChronic Fixed dose Coumadin while maintained on  Revlimid. Well known to rehabilitation services from admission November  2015 for thoracic spine decompression and fusion related to multiple  myeloma. Patient lives with his wife used  a rolling walker to ambulate  short community distances prior to admission. Presented 02/11/2015 with  right sided weakness and slurred speech 2 days. MRI of the brain showed  acute left basal ganglia lacunar infarction as well as old right caudate  lacunar infarct. Patient had MBS on 02/11/15 and recommended Dys. 3  textures with nectar-thick liquids with head turn to the right. Patient  admitted to CIR on 02/13/15 and participating in dysphagia treatment.  Repeat MBS today to assess for possible upgrade.   No Data Recorded  Assessment / Plan / Recommendation CHL IP CLINICAL IMPRESSIONS 02/20/2015  Therapy Diagnosis Mild pharyngeal phase dysphagia  Clinical Impression Patient presents with a mild sensorimotor pharyngeal  dysphagia characterized by a delayed swallow initiation resulting in  moderate valleculae and lateral channel residue that is penetrated during  the swallow. With a right head  turn, laryngeal closure was improved  resulting in reduced residuals and penetration. The remaining trace  penetrates were eliminated with a cued throat clear. Recommend patient  continue Dys. 3 textures and upgrade to thin liquids with continued use of  the head turn to the right. Patient verbalized understanding of all  information.       CHL IP TREATMENT RECOMMENDATION 02/11/2015  Treatment Recommendations Therapy as outlined in treatment plan below     CHL IP DIET RECOMMENDATION 02/20/2015  SLP Diet Recommendations Dysphagia 3 (Mech soft);Thin  Liquid Administration via (None)  Medication Administration Whole meds with puree  Compensations Small sips/bites;Slow rate;Clear throat intermittently  Postural Changes and/or Swallow Maneuvers (None)     CHL IP OTHER RECOMMENDATIONS 02/20/2015  Recommended Consults (None)  Oral Care Recommendations Oral care BID  Other Recommendations (None)     CHL IP FOLLOW UP RECOMMENDATIONS 02/12/2015  Follow up Recommendations Inpatient Rehab     CHL IP FREQUENCY AND DURATION 02/11/2015  Speech  Therapy Frequency (ACUTE ONLY) min 2x/week  Treatment Duration 2 weeks     Pertinent Vitals/Pain: N/A      SLP Swallow Goals No flowsheet data found.  No flowsheet data found.    CHL IP REASON FOR REFERRAL 02/20/2015  Reason for Referral Objectively evaluate swallowing function     CHL IP ORAL PHASE 02/20/2015  Lips (None)  Tongue (None)  Mucous membranes (None)  Nutritional status (None)  Other (None)  Oxygen therapy (None)  Oral Phase WFL  Oral - Pudding Teaspoon (None)  Oral - Pudding Cup (None)  Oral - Honey Teaspoon (None)  Oral - Honey Cup (None)  Oral - Honey Syringe (None)  Oral - Nectar Teaspoon (None)  Oral - Nectar Cup (None)  Oral - Nectar Straw (None)  Oral - Nectar Syringe (None)  Oral - Ice Chips (None)  Oral - Thin Teaspoon (None)  Oral - Thin Cup (None)  Oral - Thin Straw (None)  Oral - Thin Syringe (None)  Oral - Puree (None)  Oral - Mechanical Soft (None)  Oral - Regular (None)  Oral - Multi-consistency (None)  Oral - Pill (None)  Oral Phase - Comment (None)      CHL IP PHARYNGEAL PHASE 02/20/2015  Pharyngeal Phase Impaired  Pharyngeal - Pudding Teaspoon (None)  Penetration/Aspiration details (pudding teaspoon) (None)  Pharyngeal - Pudding Cup (None)  Penetration/Aspiration details (pudding cup) (None)  Pharyngeal - Honey Teaspoon (None)  Penetration/Aspiration details (honey teaspoon) (None)  Pharyngeal - Honey Cup (None)  Penetration/Aspiration details (honey cup) (None)  Pharyngeal - Honey Syringe (None)  Penetration/Aspiration details (honey syringe) (None)  Pharyngeal - Nectar Teaspoon (None)  Penetration/Aspiration details (nectar teaspoon) (None)  Pharyngeal - Nectar Cup (None)  Penetration/Aspiration details (nectar cup) (None)  Pharyngeal - Nectar Straw (None)  Penetration/Aspiration details (nectar straw) (None)  Pharyngeal - Nectar Syringe (None)  Penetration/Aspiration details (nectar syringe) (None)  Pharyngeal - Ice Chips (None)  Penetration/Aspiration details (ice chips) (None)   Pharyngeal - Thin Teaspoon (None)  Penetration/Aspiration details (thin teaspoon) (None)  Pharyngeal - Thin Cup (None)  Penetration/Aspiration details (thin cup) (None)  Pharyngeal - Thin Straw (None)  Penetration/Aspiration details (thin straw) (None)  Pharyngeal - Thin Syringe (None)  Penetration/Aspiration details (thin syringe') (None)  Pharyngeal - Puree (None)  Penetration/Aspiration details (puree) (None)  Pharyngeal - Mechanical Soft (None)  Penetration/Aspiration details (mechanical soft) (None)  Pharyngeal - Regular (None)  Penetration/Aspiration details (regular) (None)  Pharyngeal - Multi-consistency (None)  Penetration/Aspiration details (multi-consistency) (None)  Pharyngeal -  Pill (None)  Penetration/Aspiration details (pill) (None)  Pharyngeal Comment Head turn to right reduced residuals and penetration       No flowsheet data found.  No flowsheet data found.         PAYNE, [place] 02/20/2015, 2:54 PM    Dg Swallowing Func-speech Pathology  02/11/2015    Objective Swallowing Evaluation:    Patient Details  Name: Jeremiah Owens MRN: [id number] Date of Birth: 1930/03/08  Today's Date: 02/11/2015 Time: SLP Start Time (ACUTE ONLY): 1340-SLP Stop Time (ACUTE ONLY): 1400 SLP Time Calculation (min) (ACUTE ONLY): 20 min  Past Medical History:  Past Medical History  Diagnosis Date  . Compression fracture   . Allergy   . Anxiety   . Thyroid disease   . S/P radiation therapy 08/21/14-09/04/14    T4-6 25Gy/89f  . S/P radiation therapy 11/29/14-12/12/14    rt prox humerus/shoulder/scapula 25Gy/167f . Encounter for antineoplastic chemotherapy 01/31/2015  . Melanoma   . Bone cancer     t_spine T-5  . Prostate cancer 2002  . Skin cancer 1994    melanoma  right neck   . Multiple myeloma    Past Surgical History:  Past Surgical History  Procedure Laterality Date  . Hernia repair    . Posterior cervical fusion/foraminotomy N/A 06/17/2014    Procedure: Thoracic five laminectomy for epidural tumor resection  Thoracic 4-7  posterior lateral arthrodesis, segmental pedicle screw  fixation.;  Surgeon: KyAshok PallMD;  [facility];  Service:  Neurosurgery;  Laterality: N/A;   HPI:  Other Pertinent Information: RoBrae Gartmans a [age].o. male with a  history of multiple myeloma treated with Revlamid who presents today with  2 days of right leg weakness and difficulty speaking. He states that this  started quite abruptly. Prior to this, he did not have any problems with  his speech either. He states that he has difficulty with finding his  words, and indeed it is difficult getting history from him because of some  word finding difficulty. MRI shows Acute 18 x 9 mm LEFT basal ganglia  lacunar infarct  No Data Recorded  Assessment / Plan / Recommendation CHL IP CLINICAL IMPRESSIONS 02/11/2015  Therapy Diagnosis Moderate pharyngeal phase dysphagia  Clinical Impression Pt presents with a neuromuscular dysphagia with motor  and sensory deficits due to impairment. There is delayed initiation of the  swallow, particularly dangerous with thin liquids. With all textures there  is weakness of the right lateral wall and possible decreased adduction of  the right arytenoid resulting in silent penetration during the swallow and  residuals in the right lateral channel which are silently aspriated during  and after the swallow. With a head turn right laryngeal closure duirng the  swallow improves and residuals are eliminated. Nectar thick liquids are  still warranted due to delayed timing of swallow. Recommend pt consume a  dys 3 (mechanical soft) diet with nectar thick liquids. Initially pt will  need full supervision to ensure head turn right. As pt demonstrates  independence, supervision can fade. SLP will follow for tolerance.       CHL IP TREATMENT RECOMMENDATION 02/11/2015  Treatment Recommendations Therapy as outlined in treatment plan below     CHL IP DIET RECOMMENDATION 02/11/2015  SLP Diet Recommendations (None)  Liquid Administration  via (None)  Medication Administration Whole meds with puree  Compensations Other (Comment);Small sips/bites;Slow rate  Postural Changes and/or Swallow Maneuvers (None)     CHL IP OTHER RECOMMENDATIONS 02/11/2015  Recommended Consults (None)  Oral Care Recommendations Oral care BID  Other Recommendations Order thickener from pharmacy     No flowsheet data found.   CHL IP FREQUENCY AND DURATION 02/11/2015  Speech Therapy Frequency (ACUTE ONLY) min 2x/week  Treatment Duration 2 weeks     Pertinent Vitals/Pain NA    SLP Swallow Goals No flowsheet data found.  No flowsheet data found.    CHL IP REASON FOR REFERRAL 02/11/2015  Reason for Referral Objectively evaluate swallowing function               DeBlois, Katherene Ponto 02/11/2015, 2:16 PM    Mr Jodene Nam Head/brain Wo Cm  02/11/2015   CLINICAL DATA:  Acute on chronic RIGHT leg weakness, new onset garbled speech. History of melanoma, prostate cancer, stroke.  EXAM: MRI HEAD WITHOUT CONTRAST  MRA HEAD WITHOUT CONTRAST  TECHNIQUE: Multiplanar, multiecho pulse sequences of the brain and surrounding structures were obtained without intravenous contrast. Angiographic images of the head were obtained using MRA technique without contrast.  COMPARISON:  CT of the head February 10, 2015  FINDINGS: MRI HEAD FINDINGS  18 x 9 mm area of reduced diffusion in LEFT basal ganglia (lenticular striate nucleus), corresponding low ADC value consistent with acute ischemia. Corresponding mild FLAIR hyperintense signal. No susceptibility artifact to suggest hemorrhage.  Ventricles and sulci are normal for patient's age. Patchy to confluent supratentorial, pontine white matter T2 hyperintensities without midline shift, mass effect, mass lesions. Innumerable tiny T2 hyperintensities within the basal ganglia compatible with perivascular spaces. In addition, small old RIGHT caudate body lacunar infarct. No abnormal extra-axial fluid collections. Normal major intracranial vascular flow voids seen at the  skull base.  Ocular globes and orbital contents are unremarkable. Mild paranasal sinus mucosal thickening without air-fluid levels. Trace LEFT mastoid effusion.  MRA HEAD FINDINGS  Anterior circulation: Normal flow related enhancement of the included cervical, petrous, cavernous and supra clinoid internal carotid arteries. Patent anterior communicating artery. Normal flow related enhancement of the anterior and middle cerebral arteries, including more distal segments.  No large vessel occlusion, high-grade stenosis, aneurysm. Mild middle cerebral artery luminal irregularity.  Posterior circulation: Codominant vertebral artery's. Basilar artery is patent, with normal flow related enhancement of the main branch vessels. Normal flow related enhancement of the posterior cerebral arteries. Small LEFT posterior communicating artery is present.  No large vessel occlusion, high-grade stenosis, mild luminal irregularity of the posterior cerebral arteries. No aneurysm.  IMPRESSION: MRI HEAD: Acute 18 x 9 mm LEFT basal ganglia lacunar infarct.  Involutional changes. Moderate white matter changes compatible chronic small vessel ischemic disease. Old RIGHT caudate lacunar infarct.  MRA HEAD: No acute large vessel occlusion or high-grade stenosis.  Mild luminal irregularity intracranial vessels compatible with atherosclerosis.   Electronically Signed   By: Elon Alas M.D.   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 He completed treatment with subcutaneous Velcade and Decadron status post 11 weekly doses. This was discontinued secondary to disease progression. The patient was started on treatment with weekly subcutaneous Velcade, Revlimid for 21 days every 4 weeks in addition to Decadron 40 mg weekly status post 5 cycle and he  is tolerating it fairly well.  The patient is recovering from a recent stroke. He is feeling a little better today. I recommended for him to  continue with the same treatment regimen with subcutaneous Velcade, Revlimid and Decadron. He would come back for follow-up visit in 2 months for reevaluation for reevaluation and management of any adverse effect of his treatment. He was advised to call immediately if he has any concerning symptoms in the interval. The patient voices understanding of current disease status and treatment options and is in agreement with the current care plan.  All questions were answered. The patient knows to call the clinic with any problems, questions or concerns. We can certainly see the patient much sooner if necessary.  Disclaimer: This note was dictated with voice recognition software. Similar sounding words can inadvertently be transcribed and may not be corrected upon review.

## 2015-02-28 NOTE — Telephone Encounter (Signed)
Pt confirmed labs/ov per 07/27 POF, gave pt AVS and Calendar.... KJ, sent msg to add chemo °

## 2015-02-28 NOTE — Progress Notes (Signed)
revlimid faxed to [facility]

## 2015-02-28 NOTE — Telephone Encounter (Signed)
Per staff message and POF I have scheduled appts. Advised scheduler of appts. JMW  

## 2015-02-28 NOTE — Patient Instructions (Signed)
[facility] Discharge Instructions for Patients Receiving Chemotherapy  Today you received the following chemotherapy agents Velcade. To help prevent nausea and vomiting after your treatment, we encourage you to take your nausea medication as directed.  If you develop nausea and vomiting that is not controlled by your nausea medication, call the clinic.   BELOW ARE SYMPTOMS THAT SHOULD BE REPORTED IMMEDIATELY:  *FEVER GREATER THAN 100.5 F  *CHILLS WITH OR WITHOUT FEVER  NAUSEA AND VOMITING THAT IS NOT CONTROLLED WITH YOUR NAUSEA MEDICATION  *UNUSUAL SHORTNESS OF BREATH  *UNUSUAL BRUISING OR BLEEDING  TENDERNESS IN MOUTH AND THROAT WITH OR WITHOUT PRESENCE OF ULCERS  *URINARY PROBLEMS  *BOWEL PROBLEMS  UNUSUAL RASH Items with * indicate a potential emergency and should be followed up as soon as possible.  Feel free to call the clinic you have any questions or concerns. The clinic phone number is [phone].  Please show the CHEMO ALERT CARD at check-in to the Emergency Department and triage nurse.    

## 2015-03-05 ENCOUNTER — Ambulatory Visit: Payer: Medicare Other | Admitting: Physical Therapy

## 2015-03-05 ENCOUNTER — Ambulatory Visit: Payer: Medicare Other | Attending: Physical Medicine & Rehabilitation | Admitting: Occupational Therapy

## 2015-03-05 DIAGNOSIS — I69359 Hemiplegia and hemiparesis following cerebral infarction affecting unspecified side: Secondary | ICD-10-CM | POA: Insufficient documentation

## 2015-03-05 DIAGNOSIS — R1313 Dysphagia, pharyngeal phase: Secondary | ICD-10-CM | POA: Diagnosis not present

## 2015-03-05 DIAGNOSIS — R6889 Other general symptoms and signs: Secondary | ICD-10-CM | POA: Diagnosis not present

## 2015-03-05 DIAGNOSIS — R269 Unspecified abnormalities of gait and mobility: Secondary | ICD-10-CM

## 2015-03-05 DIAGNOSIS — R531 Weakness: Secondary | ICD-10-CM | POA: Insufficient documentation

## 2015-03-05 DIAGNOSIS — R4701 Aphasia: Secondary | ICD-10-CM | POA: Insufficient documentation

## 2015-03-05 DIAGNOSIS — R2681 Unsteadiness on feet: Secondary | ICD-10-CM

## 2015-03-05 DIAGNOSIS — R2991 Unspecified symptoms and signs involving the musculoskeletal system: Secondary | ICD-10-CM | POA: Insufficient documentation

## 2015-03-05 DIAGNOSIS — M6281 Muscle weakness (generalized): Secondary | ICD-10-CM | POA: Insufficient documentation

## 2015-03-05 DIAGNOSIS — Z7409 Other reduced mobility: Secondary | ICD-10-CM | POA: Insufficient documentation

## 2015-03-05 DIAGNOSIS — R279 Unspecified lack of coordination: Secondary | ICD-10-CM | POA: Diagnosis not present

## 2015-03-05 NOTE — Patient Instructions (Signed)
  Coordination Activities  Perform the following activities for 15-20 minutes 1-2 times per day with right hand(s).   Rotate ball in fingertips (clockwise and counter-clockwise).  Toss ball between hands.  Toss ball in air and catch with the same hand.  Flip cards 1 at a time as fast as you can.  Deal cards with your thumb (Hold deck in hand and push card off top with thumb).  Rotate 1 card in hand (5x clockwise and 5x counter-clockwise).  Shuffle cards.  Pick up coins one at a time until you get 5 in your hand, then move coins from palm to fingertips to stack one at a time. (Do 3-5 stacks)  Twirl pen between fingers.  Practice writing and/or typing.

## 2015-03-05 NOTE — Therapy (Signed)
[facility] [address] [place], [place], [address] Phone: [phone]   Fax:  [phone]  Occupational Therapy Treatment  Patient Details  Name: Jeremiah Owens MRN: [id number] Date of Birth: [birth year] Referring Provider:  Wenda Low, MD  Encounter Date: 03/05/2015      OT End of Session - 03/05/15 1050    Visit Number 2   Number of Visits 9   Date for OT Re-Evaluation 03/29/15   Authorization Type MCR - G CODE NEEDED   Authorization - Visit Number 2   Authorization - Number of Visits 10   OT Start Time 0076   OT Stop Time 1055   OT Time Calculation (min) 40 min   Activity Tolerance Patient tolerated treatment well;Patient limited by fatigue      Past Medical History  Diagnosis Date  . Compression fracture   . Allergy   . Anxiety   . Thyroid disease   . S/P radiation therapy 08/21/14-09/04/14    T4-6 25Gy/24f  . S/P radiation therapy 11/29/14-12/12/14    rt prox humerus/shoulder/scapula 25Gy/12f . Encounter for antineoplastic chemotherapy 01/31/2015  . Melanoma   . Bone cancer     t_spine T-5  . Prostate cancer 2002  . Skin cancer 1994    melanoma  right neck   . Multiple myeloma     Past Surgical History  Procedure Laterality Date  . Hernia repair    . Posterior cervical fusion/foraminotomy N/A 06/17/2014    Procedure: Thoracic five laminectomy for epidural tumor resection Thoracic 4-7 posterior lateral arthrodesis, segmental pedicle screw fixation.;  Surgeon: KyAshok PallMD;  [facility];  Service: Neurosurgery;  Laterality: N/A;    There were no vitals filed for this visit.  Visit Diagnosis:  Lack of coordination  Activity intolerance  Generalized muscle weakness      Subjective Assessment - 03/05/15 1022    Subjective  I'm doing ok   Pertinent History Basal Ganglia stroke 02/10/15, active CA, LUE shoulder impairments premorbidly approx. 75%    Limitations ** NO heat  modalities b/c actively getting chemotherapy   Repetition Increases Symptoms   Patient Stated Goals get my arm and hand better   Currently in Pain? No/denies                      OT Treatments/Exercises (OP) - 03/05/15 0001    Shoulder Exercises: ROM/Strengthening   UBE (Upper Arm Bike) x 10 min. Level 1 for BUE strength/endurance   Fine Motor Coordination   Other Fine Motor Exercises Issued coordination HEP - see pt instructions. Pt demo each as instructed                OT Education - 03/05/15 1043    Education provided Yes   Education Details coordination HEP   Person(s) Educated Patient   Methods Explanation;Demonstration;Verbal cues   Comprehension Verbalized understanding;Returned demonstration          OT Short Term Goals - 03/05/15 1054    OT SHORT TERM GOAL #1   Title NONE           OT Long Term Goals - 02/26/15 1345    OT LONG TERM GOAL #1   Title Independent w/ coordination HEP   Time 4   Period Weeks   Status New   OT LONG TERM GOAL #2   Title Pt to perform dynamic standing tasks for 10 minutes or greater w/o LOB or rest break  in prep for ADLS   Time 4   Period Weeks   Status New   OT LONG TERM GOAL #3   Title Pt to write check with 75% or greater legibility   Time 4   Period Weeks   Status New   OT LONG TERM GOAL #4   Title Improve coordination as evidenced by performing 9 hole peg test in 32 sec. or less Rt hand   Baseline 38.78 sec   Time 4   Period Weeks   Status New   OT LONG TERM GOAL #5   Title Pt to perform simple cooking activity safely mod I level for 15 min. or greater    Time 4   Period Weeks   Status New               Plan - 03/05/15 1050    Clinical Impression Statement Pt with decreased strength/endurance due to comorbidities.    Clinical Impairments Affecting Rehab Potential active CA on chemo   Plan review coordination HEP prn, practice writing, dynamic standing activity for  endurance/balance, BUE HEP if time allows   OT Home Exercise Plan coordination HEP issued 03/05/15.         Problem List Patient Active Problem List   Diagnosis Date Noted  . Basal ganglia infarction 02/12/2015  . Dizziness   . Expressive aphasia   . Past pointing   . Right leg weakness   . Acute ischemic stroke 02/11/2015  . CVA (cerebral infarction) 02/11/2015  . Aspiration pneumonia 02/11/2015  . Aphasia   . Difficulty speaking   . Speech difficult to understand   . Stroke   . Encounter for antineoplastic chemotherapy 01/31/2015  . Constipation 12/20/2014  . Rash 12/20/2014  . Cellulitis 12/06/2014  . Peripheral edema 12/06/2014  . Gait disorder 09/05/2014  . Multiple myeloma   . Paraplegia 06/21/2014  . Postoperative anemia due to acute blood loss 06/21/2014  . 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 Spine metastasis 06/17/2014  . Thoracic spine tumor 06/17/2014  . Metastatic bone cancer 06/15/2014    Carey Bullocks, OTR/L 03/05/2015, 10:54 AM  [facility] [address] [place], [place], [address] Phone: [phone]   Fax:  [phone]

## 2015-03-05 NOTE — Patient Instructions (Signed)
Leg Extension (Hamstring)   Sit toward front edge of chair, with leg out straight, heel on floor, toes pointing toward body. Hold for 60 seconds. Relax. Repeat on other leg.  Do this twice per day on each leg.  HIP / KNEE: Extension - Sit to Stand   Stand up from standard chair without using hands. Be sure your belly button is forward (as we discussed during PT session) and that you're leaning forward ("nose over knees") to stand. Do 10 stands, 3 times per day.  Weight Shift: Anterior / Posterior (Righting / Equilibrium)    BEGIN WITH BACK LEANING AGAINST THE WALL AND HEELS 4 INCHES AWAY. Move your hips off the wall and come to upright standing.  Hold for 5 seconds. Return slowly to the wall letting your hips bump the wall and return to stand.  Repeat _10__ times per session. Do _3__ sessions per day.     Fall Prevention and Home Safety Falls cause injuries and can affect all age groups. It is possible to use preventive measures to significantly decrease the likelihood of falls. There are many simple measures which can make your home safer and prevent falls. OUTDOORS  Repair cracks and edges of walkways and driveways.  Remove high doorway thresholds.  Trim shrubbery on the main path into your home.  Have good outside lighting.  Clear walkways of tools, rocks, debris, and clutter.  Check that handrails are not broken and are securely fastened. Both sides of steps should have handrails.  Have leaves, snow, and ice cleared regularly.  Use sand or salt on walkways during winter months.  In the garage, clean up grease or oil spills. BATHROOM  Install night lights.  Install grab bars by the toilet and in the tub and shower.  Use non-skid mats or decals in the tub or shower.  Place a plastic non-slip stool in the shower to sit on, if needed.  Keep floors dry and clean up all water on the floor immediately.  Remove soap buildup in the tub or shower on a regular  basis.  Secure bath mats with non-slip, double-sided rug tape.  Remove throw rugs and tripping hazards from the floors. BEDROOMS  Install night lights.  Make sure a bedside light is easy to reach.  Do not use oversized bedding.  Keep a telephone by your bedside.  Have a firm chair with side arms to use for getting dressed.  Remove throw rugs and tripping hazards from the floor. KITCHEN  Keep handles on pots and pans turned toward the center of the stove. Use back burners when possible.  Clean up spills quickly and allow time for drying.  Avoid walking on wet floors.  Avoid hot utensils and knives.  Position shelves so they are not too high or low.  Place commonly used objects within easy reach.  If necessary, use a sturdy step stool with a grab bar when reaching.  Keep electrical cables out of the way.  Do not use floor polish or wax that makes floors slippery. If you must use wax, use non-skid floor wax.  Remove throw rugs and tripping hazards from the floor. STAIRWAYS  Never leave objects on stairs.  Place handrails on both sides of stairways and use them. Fix any loose handrails. Make sure handrails on both sides of the stairways are as long as the stairs.  Check carpeting to make sure it is firmly attached along stairs. Make repairs to worn or loose carpet promptly.  Avoid placing  throw rugs at the top or bottom of stairways, or properly secure the rug with carpet tape to prevent slippage. Get rid of throw rugs, if possible.  Have an electrician put in a light switch at the top and bottom of the stairs. OTHER FALL PREVENTION TIPS  Wear low-heel or rubber-soled shoes that are supportive and fit well. Wear closed toe shoes.  When using a stepladder, make sure it is fully opened and both spreaders are firmly locked. Do not climb a closed stepladder.  Add color or contrast paint or tape to grab bars and handrails in your home. Place contrasting color strips on  first and last steps.  Learn and use mobility aids as needed. Install an electrical emergency response system.  Turn on lights to avoid dark areas. Replace light bulbs that burn out immediately. Get light switches that glow.  Arrange furniture to create clear pathways. Keep furniture in the same place.  Firmly attach carpet with non-skid or double-sided tape.  Eliminate uneven floor surfaces.  Select a carpet pattern that does not visually hide the edge of steps.  Be aware of all pets. OTHER HOME SAFETY TIPS  Set the water temperature for 120 F (48.8 C).  Keep emergency numbers on or near the telephone.  Keep smoke detectors on every level of the home and near sleeping areas. Document Released: 07/11/2002 Document Revised: 01/20/2012 Document Reviewed: 10/10/2011 [facility] Patient Information 2015 [place], [place]. This information is not intended to replace advice given to you by your health care provider. Make sure you discuss any questions you have with your health care provider.

## 2015-03-05 NOTE — Therapy (Signed)
[facility] [address] [place], [place], [address] Phone: [phone]   Fax:  [phone]  Physical Therapy Treatment  Patient Details  Name: Jeremiah Owens MRN: [id number] Date of Birth: 1930/05/29 Referring Provider:  Wenda Low, MD  Encounter Date: 03/05/2015      PT End of Session - 03/05/15 1738    Visit Number 2   Number of Visits 17   Date for PT Re-Evaluation 04/27/15   Authorization Type Medicare [employer] - G codes required every 10 visits   PT Start Time 0931   PT Stop Time 1015   PT Time Calculation (min) 44 min   Equipment Utilized During Treatment Gait belt   Activity Tolerance Patient tolerated treatment well   Behavior During Therapy [facility] for tasks assessed/performed      Past Medical History  Diagnosis Date  . Compression fracture   . Allergy   . Anxiety   . Thyroid disease   . S/P radiation therapy 08/21/14-09/04/14    T4-6 25Gy/24f  . S/P radiation therapy 11/29/14-12/12/14    rt prox humerus/shoulder/scapula 25Gy/175f . Encounter for antineoplastic chemotherapy 01/31/2015  . Melanoma   . Bone cancer     t_spine T-5  . Prostate cancer 2002  . Skin cancer 1994    melanoma  right neck   . Multiple myeloma     Past Surgical History  Procedure Laterality Date  . Hernia repair    . Posterior cervical fusion/foraminotomy N/A 06/17/2014    Procedure: Thoracic five laminectomy for epidural tumor resection Thoracic 4-7 posterior lateral arthrodesis, segmental pedicle screw fixation.;  Surgeon: KyAshok PallMD;  [facility];  Service: Neurosurgery;  Laterality: N/A;    There were no vitals filed for this visit.  Visit Diagnosis:  Abnormality of gait  Weakness generalized  Unsteadiness      Subjective Assessment - 03/05/15 0936    Subjective Pt denies falls. Arrived to today's session with home exercises provided in inpatient rehab.   Pertinent History Basal ganglia  infarct 02/10/15 with expressive aphasia; prostate cancer, skin cancer, multiple myeloma (active; chemotherapy on Wednesdays)   Limitations Walking   Patient Stated Goals "To be able to function without the walker (rollator); better balance."   Currently in Pain? No/denies         Treatment   Neuro Re-ed:  - Completed Berg Balance Scale with score of 29/56. See below for detailed findings. - Pt performed all home exercises with verbal/demonstration cueing from this PT. See Pt Instructions for details on all exercises, reps, and sets.                 OPThorntondult PT Treatment/Exercise - 03/05/15 0001    Berg Balance Test   Sit to Stand Able to stand using hands after several tries   Standing Unsupported Able to stand 2 minutes with supervision  occasional posterior lean with self-correction   Sitting with Back Unsupported but Feet Supported on Floor or Stool Able to sit safely and securely 2 minutes   Stand to Sit Uses backs of legs against chair to control descent   Transfers Able to transfer with verbal cueing and /or supervision   Standing Unsupported with Eyes Closed Able to stand 10 seconds with supervision  posterior preference   Standing Ubsupported with Feet Together Able to place feet together independently but unable to hold for 30 seconds  LOB to R/posteriorly after 16 seconds   From Standing, Reach Forward  with Outstretched Arm Can reach forward >12 cm safely (5")  7"   From Standing Position, Pick up Object from [place] to pick up shoe, needs supervision   From Standing Position, Turn to Look Behind Over each Shoulder Needs assist to keep from losing balance and falling  posterior LOB during R rotation   Turn 360 Degrees Needs close supervision or verbal cueing  decreased wt shift to RLE   Standing Unsupported, Alternately Place Feet on Step/Stool Able to complete >2 steps/needs minimal assist  min guard due to R toe catch   Standing Unsupported, One Foot in  [place] to take small step independently and hold 30 seconds   Standing on One Leg Tries to lift leg/unable to hold 3 seconds but remains standing independently   Total Score 29                PT Education - 03/05/15 1732    Education provided Yes   Education Details Merrilee Jansky findings, functional implications. HEP initiated. Fall prevention strategies.   Person(s) Educated Patient   Methods Explanation;Demonstration;Verbal cues;Handout   Comprehension Verbalized understanding;Returned demonstration          PT Short Term Goals - 03/05/15 1734    PT SHORT TERM GOAL #1   Title Pt will perform home exercises with mod I using paper handout to indicate safe HEP compliance. Target date: 03/26/15   Status On-going   PT SHORT TERM GOAL #2   Title Perform Berg Balance Scale to assess stability with functional standing balance.  Target date: 03/26/15   Baseline 8/1: score 29/56   Status Achieved   PT SHORT TERM GOAL #3   Title Pt will decrease TUG time from 21.58 to 17.5 seconds or less to indicate improved efficiency of functional mobility.  Target date: 03/26/15   Baseline 21.58 sec with rollator   Status New   PT SHORT TERM GOAL #4   Title Pt will negotiate standard curb step with mod I using LRAD to indicate increased safety/independence with community mobility.  Target date: 03/26/15   Status On-going   PT SHORT TERM GOAL #5   Title Pt will ambulate at self-selected gait speed of 2.63 ft/sec to reach status of community ambulator. Target date: 03/26/15   Status On-going   PT SHORT TERM GOAL #6   Title Pt will perform sit > stand from standard chair without UE support to indicate increased functional LE strength.  Target date: 03/26/15   Status On-going   PT SHORT TERM GOAL #7   Title _________________________________________________________________________   PT SHORT TERM GOAL #8   Title _________________________________________________________________   PT SHORT TERM GOAL #9    TITLE ____________________________________________________________________________________           PT Long Term Goals - 03/05/15 1735    PT LONG TERM GOAL #1   Title Pt/spouse will verbalize understanding of CVA warning signs to indicate understanding of situation in which to seek medical attention. Target date: 04/23/15.   Baseline ____________________________   Status On-going   PT LONG TERM GOAL #2   Title Pt will ambulate x200' over indoor, level surfaces with mod I, increased time without AD to indicate increased pt independence with household mobility, progress toward pt-stated goal. Target date: 04/23/15.   Baseline ________________________________   Status On-going   PT LONG TERM GOAL #3   Title Pt will perform TUG test in < 13.5 sconds to indicate decreased risk of falling. Target date: 04/23/15.   Baseline 21.5  seconds with rollator   Status On-going   PT LONG TERM GOAL #4   Title Pt will increase Berg Balance Scale score by 6 points from baseline to indicate improvement in functional standing balance. Target date: 04/23/15.   Baseline 8/1: scored 29/56   Status On-going   PT LONG TERM GOAL #5   Title Pt will ambulate x500' over outdoor, unlevel surfaces with mod I using LRAD with no overt LOB to indicate increased safety with community mobility. Target date: 04/23/15.   Status On-going   PT LONG TERM GOAL #6   Title ______________________________________________________________________________   Baseline ___________________________________________               Plan - 03/05/15 1739    Clinical Impression Statement Completed Merrilee Jansky and initiated home exercises. Fall prevention handout provided. Pt continues to exhibit posterior preference in standing with inconsistent ankle strategy, impaired hip strategy. Continue per POC.   Pt will benefit from skilled therapeutic intervention in order to improve on the following deficits Abnormal gait;Decreased balance;Difficulty  walking;Decreased strength;Postural dysfunction;Decreased knowledge of use of DME;Decreased activity tolerance;Decreased endurance;Other (comment)  impaired proprioception; impaired postural stability/control   Rehab Potential Good   Clinical Impairments Affecting Rehab Potential Currently undergoing chemotherapy 1x/week   PT Frequency 2x / week   PT Duration 8 weeks   PT Treatment/Interventions ADLs/Self Care Home Management;Gait training;Therapeutic exercise;Patient/family education;Stair training;Balance training;Functional mobility training;Neuromuscular re-education;Manual techniques;Therapeutic activities;DME Instruction;Vestibular;Orthotic Fit/Training   PT Next Visit Plan  Review safe/proper use of rollator for transfers (focus on locking brakes) and with community mobility (curb step negotiation). Ask about HEP.   PT Home Exercise Plan See Pt Instructions fro 8/1.   Consulted and Agree with Plan of Care Patient        Problem List Patient Active Problem List   Diagnosis Date Noted  . Basal ganglia infarction 02/12/2015  . Dizziness   . Expressive aphasia   . Past pointing   . Right leg weakness   . Acute ischemic stroke 02/11/2015  . CVA (cerebral infarction) 02/11/2015  . Aspiration pneumonia 02/11/2015  . Aphasia   . Difficulty speaking   . Speech difficult to understand   . Stroke   . Encounter for antineoplastic chemotherapy 01/31/2015  . Constipation 12/20/2014  . Rash 12/20/2014  . Cellulitis 12/06/2014  . Peripheral edema 12/06/2014  . Gait disorder 09/05/2014  . Multiple myeloma   . Paraplegia 06/21/2014  . Postoperative anemia due to acute blood loss 06/21/2014  . 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 Spine metastasis 06/17/2014  . Thoracic spine tumor 06/17/2014  . Metastatic bone cancer 06/15/2014    Billie Ruddy, PT, DPT [facility] [address] [place], [place], [address] Phone: [phone]   Fax:   [phone] 03/05/2015, 5:40 PM

## 2015-03-07 ENCOUNTER — Other Ambulatory Visit (HOSPITAL_BASED_OUTPATIENT_CLINIC_OR_DEPARTMENT_OTHER): Payer: Medicare Other

## 2015-03-07 ENCOUNTER — Ambulatory Visit (HOSPITAL_BASED_OUTPATIENT_CLINIC_OR_DEPARTMENT_OTHER): Payer: Medicare Other

## 2015-03-07 ENCOUNTER — Telehealth: Payer: Self-pay | Admitting: Internal Medicine

## 2015-03-07 VITALS — BP 123/46 | HR 68 | Temp 97.9°F | Resp 16

## 2015-03-07 DIAGNOSIS — Z5112 Encounter for antineoplastic immunotherapy: Secondary | ICD-10-CM

## 2015-03-07 DIAGNOSIS — C9000 Multiple myeloma not having achieved remission: Secondary | ICD-10-CM | POA: Diagnosis present

## 2015-03-07 DIAGNOSIS — R1310 Dysphagia, unspecified: Secondary | ICD-10-CM | POA: Diagnosis not present

## 2015-03-07 DIAGNOSIS — I6340 Cerebral infarction due to embolism of unspecified cerebral artery: Secondary | ICD-10-CM | POA: Diagnosis not present

## 2015-03-07 DIAGNOSIS — M6281 Muscle weakness (generalized): Secondary | ICD-10-CM | POA: Diagnosis not present

## 2015-03-07 LAB — BASIC METABOLIC PANEL (CC13)
Anion Gap: 7 mEq/L (ref 3–11)
BUN: 17.1 mg/dL (ref 7.0–26.0)
CO2: 31 mEq/L — ABNORMAL HIGH (ref 22–29)
CREATININE: 0.9 mg/dL (ref 0.7–1.3)
Calcium: 8.7 mg/dL (ref 8.4–10.4)
Chloride: 102 mEq/L (ref 98–109)
EGFR: 80 mL/min/{1.73_m2} — AB (ref >90)
GLUCOSE: 106 mg/dL (ref 70–140)
POTASSIUM: 3.8 meq/L (ref 3.5–5.1)
Sodium: 139 mEq/L (ref 136–145)

## 2015-03-07 MED ORDER — BORTEZOMIB CHEMO SQ INJECTION 3.5 MG (2.5MG/ML)
1.3000 mg/m2 | Freq: Once | INTRAMUSCULAR | Status: AC
  Administered 2015-03-07: 2.5 mg via SUBCUTANEOUS
  Filled 2015-03-07: qty 2.5

## 2015-03-07 MED ORDER — ONDANSETRON 8 MG PO TBDP
8.0000 mg | ORAL_TABLET | Freq: Once | ORAL | Status: AC
  Administered 2015-03-07: 8 mg via ORAL
  Filled 2015-03-07: qty 1

## 2015-03-07 NOTE — Telephone Encounter (Signed)
Pt confirmed labs/ov per 08/03 POF, gave pt avs and calendar... KJ °

## 2015-03-07 NOTE — Patient Instructions (Signed)
[facility] Discharge Instructions for Patients Receiving Chemotherapy  Today you received the following chemotherapy agents Velcade. To help prevent nausea and vomiting after your treatment, we encourage you to take your nausea medication as directed.  If you develop nausea and vomiting that is not controlled by your nausea medication, call the clinic.   BELOW ARE SYMPTOMS THAT SHOULD BE REPORTED IMMEDIATELY:  *FEVER GREATER THAN 100.5 F  *CHILLS WITH OR WITHOUT FEVER  NAUSEA AND VOMITING THAT IS NOT CONTROLLED WITH YOUR NAUSEA MEDICATION  *UNUSUAL SHORTNESS OF BREATH  *UNUSUAL BRUISING OR BLEEDING  TENDERNESS IN MOUTH AND THROAT WITH OR WITHOUT PRESENCE OF ULCERS  *URINARY PROBLEMS  *BOWEL PROBLEMS  UNUSUAL RASH Items with * indicate a potential emergency and should be followed up as soon as possible.  Feel free to call the clinic you have any questions or concerns. The clinic phone number is [phone].  Please show the CHEMO ALERT CARD at check-in to the Emergency Department and triage nurse.    

## 2015-03-08 ENCOUNTER — Ambulatory Visit: Payer: Medicare Other | Admitting: Physical Therapy

## 2015-03-08 ENCOUNTER — Ambulatory Visit: Payer: Medicare Other | Admitting: Occupational Therapy

## 2015-03-08 DIAGNOSIS — R279 Unspecified lack of coordination: Secondary | ICD-10-CM

## 2015-03-08 DIAGNOSIS — R1313 Dysphagia, pharyngeal phase: Secondary | ICD-10-CM | POA: Diagnosis not present

## 2015-03-08 DIAGNOSIS — R269 Unspecified abnormalities of gait and mobility: Secondary | ICD-10-CM | POA: Diagnosis not present

## 2015-03-08 DIAGNOSIS — R4701 Aphasia: Secondary | ICD-10-CM | POA: Diagnosis not present

## 2015-03-08 DIAGNOSIS — R2681 Unsteadiness on feet: Secondary | ICD-10-CM

## 2015-03-08 DIAGNOSIS — R531 Weakness: Secondary | ICD-10-CM

## 2015-03-08 DIAGNOSIS — R6889 Other general symptoms and signs: Secondary | ICD-10-CM | POA: Diagnosis not present

## 2015-03-08 DIAGNOSIS — M6281 Muscle weakness (generalized): Secondary | ICD-10-CM | POA: Diagnosis not present

## 2015-03-08 NOTE — Therapy (Signed)
[facility] [address] [place], [place], [address] Phone: [phone]   Fax:  [phone]  Occupational Therapy Treatment  Patient Details  Name: Jeremiah Owens MRN: [id number] Date of Birth: 1930/06/08 Referring Provider:  Wenda Low, MD  Encounter Date: 03/08/2015      OT End of Session - 03/08/15 1421    Visit Number 3   Number of Visits 9   Date for OT Re-Evaluation 03/29/15   Authorization Type MCR - G CODE NEEDED   Authorization - Visit Number 3   Authorization - Number of Visits 10   OT Start Time 3570   OT Stop Time 1400   OT Time Calculation (min) 45 min   Activity Tolerance Patient tolerated treatment well      Past Medical History  Diagnosis Date  . Compression fracture   . Allergy   . Anxiety   . Thyroid disease   . S/P radiation therapy 08/21/14-09/04/14    T4-6 25Gy/74f  . S/P radiation therapy 11/29/14-12/12/14    rt prox humerus/shoulder/scapula 25Gy/143f . Encounter for antineoplastic chemotherapy 01/31/2015  . Melanoma   . Bone cancer     t_spine T-5  . Prostate cancer 2002  . Skin cancer 1994    melanoma  right neck   . Multiple myeloma     Past Surgical History  Procedure Laterality Date  . Hernia repair    . Posterior cervical fusion/foraminotomy N/A 06/17/2014    Procedure: Thoracic five laminectomy for epidural tumor resection Thoracic 4-7 posterior lateral arthrodesis, segmental pedicle screw fixation.;  Surgeon: KyAshok PallMD;  [facility];  Service: Neurosurgery;  Laterality: N/A;    There were no vitals filed for this visit.  Visit Diagnosis:  Lack of coordination      Subjective Assessment - 03/08/15 1321    Pertinent History Basal Ganglia stroke 02/10/15, active CA, LUE shoulder impairments premorbidly approx. 75%    Limitations ** NO heat modalities b/c actively getting chemotherapy   Repetition Increases Symptoms   Patient Stated Goals get my arm  and hand better   Currently in Pain? No/denies                      OT Treatments/Exercises (OP) - 03/08/15 0001    Fine Motor Coordination   Other Fine Motor Exercises Reviewed coordination HEP thoroughly with pt per pt request. Pt reports having much better understanding today   Other Fine Motor Exercises Practiced writing name with and without built up pen. Pt did not have difference in legibility b/t the 2 pens, but preferred built up pen. Issued tan foarm. Pt then practiced writing "check" but noticed decreased acuity only when demo expressive aphasia.                   OT Short Term Goals - 03/05/15 1054    OT SHORT TERM GOAL #1   Title NONE           OT Long Term Goals - 03/08/15 1425    OT LONG TERM GOAL #1   Title Independent w/ coordination HEP   Time 4   Period Weeks   Status Achieved   OT LONG TERM GOAL #2   Title Pt to perform dynamic standing tasks for 10 minutes or greater w/o LOB or rest break in prep for ADLS   Time 4   Period Weeks   Status New   OT LONG TERM GOAL #3  Title Pt to write check with 75% or greater legibility   Time 4   Period Weeks   Status On-going   OT LONG TERM GOAL #4   Title Improve coordination as evidenced by performing 9 hole peg test in 32 sec. or less Rt hand   Baseline 38.78 sec   Time 4   Period Weeks   Status New   OT LONG TERM GOAL #5   Title Pt to perform simple cooking activity safely mod I level for 15 min. or greater    Time 4   Period Weeks   Status New               Plan - 03/08/15 1426    Clinical Impression Statement Pt progressing with coordination, however writing checks limited by expressive aphasia   Plan dynamic standing and kitchen safety with rollator in prep for cooking tasks   OT Home Exercise Plan coordination HEP issued 03/05/15.    Consulted and Agree with Plan of Care Patient        Problem List Patient Active Problem List   Diagnosis Date Noted  . Basal  ganglia infarction 02/12/2015  . Dizziness   . Expressive aphasia   . Past pointing   . Right leg weakness   . Acute ischemic stroke 02/11/2015  . CVA (cerebral infarction) 02/11/2015  . Aspiration pneumonia 02/11/2015  . Aphasia   . Difficulty speaking   . Speech difficult to understand   . Stroke   . Encounter for antineoplastic chemotherapy 01/31/2015  . Constipation 12/20/2014  . Rash 12/20/2014  . Cellulitis 12/06/2014  . Peripheral edema 12/06/2014  . Gait disorder 09/05/2014  . Multiple myeloma   . Paraplegia 06/21/2014  . Postoperative anemia due to acute blood loss 06/21/2014  . 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 Spine metastasis 06/17/2014  . Thoracic spine tumor 06/17/2014  . Metastatic bone cancer 06/15/2014    Carey Bullocks, OTR/L 03/08/2015, 2:27 PM  [facility] [address] [place], [place], [address] Phone: [phone]   Fax:  [phone]

## 2015-03-08 NOTE — Therapy (Signed)
[facility] [address] [place], [place], [address] Phone: [phone]   Fax:  [phone]  Physical Therapy Treatment  Patient Details  Name: Jeremiah Owens MRN: [id number] Date of Birth: 03/19/1930 Referring Provider:  Wenda Low, MD  Encounter Date: 03/08/2015      PT End of Session - 03/08/15 1642    Visit Number 3   Number of Visits 17   Date for PT Re-Evaluation 04/27/15   Authorization Type Medicare [employer] - G codes required every 10 visits   PT Start Time 1400   PT Stop Time 1448   PT Time Calculation (min) 48 min   Equipment Utilized During Treatment Gait belt   Activity Tolerance Patient tolerated treatment well   Behavior During Therapy [facility] for tasks assessed/performed      Past Medical History  Diagnosis Date  . Compression fracture   . Allergy   . Anxiety   . Thyroid disease   . S/P radiation therapy 08/21/14-09/04/14    T4-6 25Gy/57f  . S/P radiation therapy 11/29/14-12/12/14    rt prox humerus/shoulder/scapula 25Gy/170f . Encounter for antineoplastic chemotherapy 01/31/2015  . Melanoma   . Bone cancer     t_spine T-5  . Prostate cancer 2002  . Skin cancer 1994    melanoma  right neck   . Multiple myeloma     Past Surgical History  Procedure Laterality Date  . Hernia repair    . Posterior cervical fusion/foraminotomy N/A 06/17/2014    Procedure: Thoracic five laminectomy for epidural tumor resection Thoracic 4-7 posterior lateral arthrodesis, segmental pedicle screw fixation.;  Surgeon: KyAshok PallMD;  [facility];  Service: Neurosurgery;  Laterality: N/A;    There were no vitals filed for this visit.  Visit Diagnosis:  Abnormality of gait  Unsteadiness  Weakness generalized      Subjective Assessment - 03/08/15 1408    Subjective No falls. No changes. Pt arrived with HEP handout provided during last session and stated, "I'm not sure if I'm doing this  rigtht."   Pertinent History Basal ganglia infarct 02/10/15 with expressive aphasia; prostate cancer, skin cancer, multiple myeloma (active; chemotherapy on Wednesdays)   Limitations Walking   Patient Stated Goals "To be able to function without the walker (rollator); better balance."   Currently in Pain? No/denies                         [facility] Adult PT Treatment/Exercise - 03/08/15 0001    Transfers   Transfers Sit to Stand;Stand to Sit   Sit to Stand 5: Supervision   Sit to Stand Details (indicate cue type and reason) initially required cueing for safe use of rollator with effective withins-session carryover. Requires use of single UE due to LE weakness   Stand to Sit 5: Supervision   Stand to Sit Details cueing for safe use of AD (locking rollator) > 25% of tranfers   Number of Reps 10 reps   Ambulation/Gait   Ambulation/Gait Yes   Ambulation/Gait Assistance 5: Supervision   Ambulation Distance (Feet) 325 Feet   Assistive device 4-wheeled walker   Gait Pattern Step-through pattern;Decreased dorsiflexion - right;Poor foot clearance - right;Trunk flexed;Decreased hip/knee flexion - right;Decreased weight shift to left;Poor foot clearance - left   Ambulation Surface Level;Indoor   Gait Comments Standing with single UE support at parallel bars, pt performed LLE then RLE forward/retro stepping over beam (heels/toes off edge of beam) to  facilitate anterolateral weight shift, to increase single limb stance stability, and to increase B hip/knee flexion to facilitate consistent clearance of respective LE during gait. Tactile cueing for lateral weight shift, increased hip/knee flexion.   Posture/Postural Control   Posture/Postural Control Postural limitations   Postural Limitations Rounded Shoulders;Forward head;Posterior pelvic tilt;Increased thoracic kyphosis;Flexed trunk   Neuro Re-ed    Neuro Re-ed Details  wall bumps for increased motor control in B hip extensors, paraspinals  to promote more effective hip strategy with posterior LOB. Pt performed 10 reps with cueing for technique. See pt instructions for details.    Exercises   Exercises Other Exercises   Other Exercises  Walking with high knees 2 x10' with single UE support at countertop; cueing for increased R ankle dorsiflexion.   Lumbar Exercises: Stretches   Active Hamstring Stretch 60 seconds  per side                  PT Short Term Goals - 03/05/15 1734    PT SHORT TERM GOAL #1   Title Pt will perform home exercises with mod I using paper handout to indicate safe HEP compliance. Target date: 03/26/15   Status On-going   PT SHORT TERM GOAL #2   Title Perform Berg Balance Scale to assess stability with functional standing balance.  Target date: 03/26/15   Baseline 8/1: score 29/56   Status Achieved   PT SHORT TERM GOAL #3   Title Pt will decrease TUG time from 21.58 to 17.5 seconds or less to indicate improved efficiency of functional mobility.  Target date: 03/26/15   Baseline 21.58 sec with rollator   Status New   PT SHORT TERM GOAL #4   Title Pt will negotiate standard curb step with mod I using LRAD to indicate increased safety/independence with community mobility.  Target date: 03/26/15   Status On-going   PT SHORT TERM GOAL #5   Title Pt will ambulate at self-selected gait speed of 2.63 ft/sec to reach status of community ambulator. Target date: 03/26/15   Status On-going   PT SHORT TERM GOAL #6   Title Pt will perform sit > stand from standard chair without UE support to indicate increased functional LE strength.  Target date: 03/26/15   Status On-going           PT Long Term Goals - 03/05/15 1735    PT LONG TERM GOAL #1   Title Pt/spouse will verbalize understanding of CVA warning signs to indicate understanding of situation in which to seek medical attention. Target date: 04/23/15.   Baseline ____________________________   Status On-going   PT LONG TERM GOAL #2   Title Pt will  ambulate x200' over indoor, level surfaces with mod I, increased time without AD to indicate increased pt independence with household mobility, progress toward pt-stated goal. Target date: 04/23/15.   Baseline ________________________________   Status On-going   PT LONG TERM GOAL #3   Title Pt will perform TUG test in < 13.5 sconds to indicate decreased risk of falling. Target date: 04/23/15.   Baseline 21.5 seconds with rollator   Status On-going   PT LONG TERM GOAL #4   Title Pt will increase Berg Balance Scale score by 6 points from baseline to indicate improvement in functional standing balance. Target date: 04/23/15.   Baseline 8/1: scored 29/56   Status On-going   PT LONG TERM GOAL #5   Title Pt will ambulate x500' over outdoor, unlevel surfaces with mod I using  LRAD with no overt LOB to indicate increased safety with community mobility. Target date: 04/23/15.   Status On-going               Plan - 03/08/15 1642    Clinical Impression Statement Session focused on increasing BLE clearance during gait. Pt with increased awareness of posterior preference; however, hip strategy ineffective with posterior LOB. Also noted impaired selective control of R ankle dorsiflexion with R knee flexion, causing decreased RLE clearance during gait. Continue per POC.   Pt will benefit from skilled therapeutic intervention in order to improve on the following deficits Abnormal gait;Decreased balance;Difficulty walking;Decreased strength;Postural dysfunction;Decreased knowledge of use of DME;Decreased activity tolerance;Decreased endurance;Other (comment)  impaired proprioception; impaired postural stability/control   Rehab Potential Good   Clinical Impairments Affecting Rehab Potential Currently undergoing chemotherapy 1x/week   PT Frequency 2x / week   PT Duration 8 weeks   PT Treatment/Interventions ADLs/Self Care Home Management;Gait training;Therapeutic exercise;Patient/family education;Stair  training;Balance training;Functional mobility training;Neuromuscular re-education;Manual techniques;Therapeutic activities;DME Instruction;Vestibular;Orthotic Fit/Training   PT Next Visit Plan Address community mobility (curb step and ramp negotiation) with rollator. Address hip strategy, impaired selective control of R ankle dorsiflexion with R knee flexion.   PT Home Exercise Plan See Pt Instructions fro 8/4.   Consulted and Agree with Plan of Care Patient        Problem List Patient Active Problem List   Diagnosis Date Noted  . Basal ganglia infarction 02/12/2015  . Dizziness   . Expressive aphasia   . Past pointing   . Right leg weakness   . Acute ischemic stroke 02/11/2015  . CVA (cerebral infarction) 02/11/2015  . Aspiration pneumonia 02/11/2015  . Aphasia   . Difficulty speaking   . Speech difficult to understand   . Stroke   . Encounter for antineoplastic chemotherapy 01/31/2015  . Constipation 12/20/2014  . Rash 12/20/2014  . Cellulitis 12/06/2014  . Peripheral edema 12/06/2014  . Gait disorder 09/05/2014  . Multiple myeloma   . Paraplegia 06/21/2014  . Postoperative anemia due to acute blood loss 06/21/2014  . 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 Spine metastasis 06/17/2014  . Thoracic spine tumor 06/17/2014  . Metastatic bone cancer 06/15/2014   Billie Ruddy, PT, DPT [facility] [address] [place], [place], [address] Phone: [phone]   Fax:  [phone] 03/08/2015, 4:55 PM

## 2015-03-08 NOTE — Patient Instructions (Addendum)
Patient Instructions     Leg Extension (Hamstring)   Sit toward front edge of chair, with leg out straight, heel on floor, toes pointing toward body. Hold for 60 seconds. Relax. Repeat on other leg.  Do this twice per day on each leg.  HIP / KNEE: Extension - Sit to Stand   Stand up from standard chair using only one hand.. Be sure your belly button is forward (as we discussed during PT session) before starting. Also, be sure you're leaning forward ("nose over knees") to stand. Do 10 stands, 3 times per day.   Weight Shift: Anterior / Posterior (Righting / Equilibrium)    BEGIN WITH BACK LEANING AGAINST THE WALL AND HEELS 4 INCHES AWAY. Move your hips off the wall and come to upright standing. Hold for 5 seconds. Return slowly to the wall letting your hips bump the wall and return to stand.  Repeat _10__ times per session. Do _3__ sessions per day.   Walking with high knees   Stand with one hand on your kitchen countertop. Walk the length of the countertop with knees high (as though you're marching). Make sure your ankle is also positioned such that your toes are pointing upward when you take a step. Walk the length of your countertop this way 6-8 times. Perform 3 times per day.    Fall Prevention and Home Safety Falls cause injuries and can affect all age groups. It is possible to use preventive measures to significantly decrease the likelihood of falls. There are many simple measures which can make your home safer and prevent falls. OUTDOORS  Repair cracks and edges of walkways and driveways.  Remove high doorway thresholds.  Trim shrubbery on the main path into your home.  Have good outside lighting.  Clear walkways of tools, rocks, debris, and clutter.  Check that handrails are not broken and are securely fastened. Both sides of steps should have handrails.  Have leaves, snow, and ice cleared regularly.  Use sand or salt on walkways during winter months.  In  the garage, clean up grease or oil spills. BATHROOM  Install night lights.  Install grab bars by the toilet and in the tub and shower.  Use non-skid mats or decals in the tub or shower.  Place a plastic non-slip stool in the shower to sit on, if needed.  Keep floors dry and clean up all water on the floor immediately.  Remove soap buildup in the tub or shower on a regular basis.  Secure bath mats with non-slip, double-sided rug tape.  Remove throw rugs and tripping hazards from the floors. BEDROOMS  Install night lights.  Make sure a bedside light is easy to reach.  Do not use oversized bedding.  Keep a telephone by your bedside.  Have a firm chair with side arms to use for getting dressed.  Remove throw rugs and tripping hazards from the floor. KITCHEN  Keep handles on pots and pans turned toward the center of the stove. Use back burners when possible.  Clean up spills quickly and allow time for drying.  Avoid walking on wet floors.  Avoid hot utensils and knives.  Position shelves so they are not too high or low.  Place commonly used objects within easy reach.  If necessary, use a sturdy step stool with a grab bar when reaching.  Keep electrical cables out of the way.  Do not use floor polish or wax that makes floors slippery. If you must use wax, use non-skid  floor wax.  Remove throw rugs and tripping hazards from the floor. STAIRWAYS  Never leave objects on stairs.  Place handrails on both sides of stairways and use them. Fix any loose handrails. Make sure handrails on both sides of the stairways are as long as the stairs.  Check carpeting to make sure it is firmly attached along stairs. Make repairs to worn or loose carpet promptly.  Avoid placing throw rugs at the top or bottom of stairways, or properly secure the rug with carpet tape to prevent slippage. Get rid of throw rugs, if possible.  Have an electrician put in a light switch at the top and  bottom of the stairs. OTHER FALL PREVENTION TIPS  Wear low-heel or rubber-soled shoes that are supportive and fit well. Wear closed toe shoes.  When using a stepladder, make sure it is fully opened and both spreaders are firmly locked. Do not climb a closed stepladder.  Add color or contrast paint or tape to grab bars and handrails in your home. Place contrasting color strips on first and last steps.  Learn and use mobility aids as needed. Install an electrical emergency response system.  Turn on lights to avoid dark areas. Replace light bulbs that burn out immediately. Get light switches that glow.  Arrange furniture to create clear pathways. Keep furniture in the same place.  Firmly attach carpet with non-skid or double-sided tape.  Eliminate uneven floor surfaces.  Select a carpet pattern that does not visually hide the edge of steps.  Be aware of all pets. OTHER HOME SAFETY TIPS  Set the water temperature for 120 F (48.8 C).  Keep emergency numbers on or near the telephone.

## 2015-03-13 ENCOUNTER — Ambulatory Visit: Payer: Medicare Other

## 2015-03-13 ENCOUNTER — Ambulatory Visit: Payer: Medicare Other | Admitting: Physical Therapy

## 2015-03-13 ENCOUNTER — Ambulatory Visit: Payer: Medicare Other | Admitting: Occupational Therapy

## 2015-03-13 DIAGNOSIS — Z7409 Other reduced mobility: Secondary | ICD-10-CM

## 2015-03-13 DIAGNOSIS — R2681 Unsteadiness on feet: Secondary | ICD-10-CM

## 2015-03-13 DIAGNOSIS — M6281 Muscle weakness (generalized): Secondary | ICD-10-CM | POA: Diagnosis not present

## 2015-03-13 DIAGNOSIS — R279 Unspecified lack of coordination: Secondary | ICD-10-CM

## 2015-03-13 DIAGNOSIS — R1313 Dysphagia, pharyngeal phase: Secondary | ICD-10-CM

## 2015-03-13 DIAGNOSIS — R29818 Other symptoms and signs involving the nervous system: Secondary | ICD-10-CM

## 2015-03-13 DIAGNOSIS — R6889 Other general symptoms and signs: Secondary | ICD-10-CM

## 2015-03-13 DIAGNOSIS — R269 Unspecified abnormalities of gait and mobility: Secondary | ICD-10-CM

## 2015-03-13 DIAGNOSIS — R4701 Aphasia: Secondary | ICD-10-CM

## 2015-03-13 DIAGNOSIS — R2991 Unspecified symptoms and signs involving the musculoskeletal system: Secondary | ICD-10-CM

## 2015-03-13 NOTE — Patient Instructions (Addendum)
    SWALLOWING EXERCISES Do these 6 of the 7 days per week  1. Effortful Swallows - Press your tongue to the roof of your mouth - Squeeze hard with the muscles in your neck while you swallow your saliva, or a sip of water - Repeat15- 20 times, 2-3 times a day, and use whenever you eat or drink  2. Masako Swallow - swallow with your tongue sticking out - Stick tongue out past your teeth and gently bite tongue with your teeth - Swallow, while holding your tongue with your teeth - Repeat 15-20 times, 2-3 times a day *use a wet spoon if your mouth gets dry*  3. Breath Hold - Say "HUH!" loudly, then hold your breath for 3 seconds at your voice box - Repeat 20 times, 2-3 times a day        4.  Gargle             - Look up at the ceiling and pretend to gargle for 5 seconds             - Do this 10 times, 2-3 times a day    =========================================================== DURING MEALS:  * turn head to right  * clear throat about every 3-5 minutes

## 2015-03-13 NOTE — Therapy (Signed)
[facility] [address] [place], [place], [address] Phone: [phone]   Fax:  [phone]  Physical Therapy Treatment  Patient Details  Name: Jeremiah Owens MRN: [id number] Date of Birth: [birth year] Referring Provider:  Wenda Low, MD  Encounter Date: 03/13/2015      PT End of Session - 03/13/15 1307    Visit Number 4   Number of Visits 17   Date for PT Re-Evaluation 04/27/15   Authorization Type Medicare [employer] - G codes required every 10 visits   PT Start Time 0930   PT Stop Time 1015   PT Time Calculation (min) 45 min   Equipment Utilized During Treatment Gait belt   Activity Tolerance Patient tolerated treatment well   Behavior During Therapy [facility] for tasks assessed/performed      Past Medical History  Diagnosis Date  . Compression fracture   . Allergy   . Anxiety   . Thyroid disease   . S/P radiation therapy 08/21/14-09/04/14    T4-6 25Gy/44f  . S/P radiation therapy 11/29/14-12/12/14    rt prox humerus/shoulder/scapula 25Gy/192f . Encounter for antineoplastic chemotherapy 01/31/2015  . Melanoma   . Bone cancer     t_spine T-5  . Prostate cancer 2002  . Skin cancer 1994    melanoma  right neck   . Multiple myeloma     Past Surgical History  Procedure Laterality Date  . Hernia repair    . Posterior cervical fusion/foraminotomy N/A 06/17/2014    Procedure: Thoracic five laminectomy for epidural tumor resection Thoracic 4-7 posterior lateral arthrodesis, segmental pedicle screw fixation.;  Surgeon: KyAshok PallMD;  [facility];  Service: Neurosurgery;  Laterality: N/A;    There were no vitals filed for this visit.  Visit Diagnosis:  Abnormality of gait  Unsteadiness  Poor motor control of trunk      Subjective Assessment - 03/13/15 0934    Subjective Pt denies falls and reports no significant changes. Pt states, "Every day I go through the exercises; it's going pretty  good."   Pertinent History Basal ganglia infarct 02/10/15 with expressive aphasia; prostate cancer, skin cancer, multiple myeloma (active; chemotherapy on Wednesdays)   Limitations Walking   Patient Stated Goals "To be able to function without the walker (rollator); better balance."   Currently in Pain? No/denies                         [facility] Adult PT Treatment/Exercise - 03/13/15 0001    Transfers   Transfers Sit to Stand;Stand to Sit   Sit to Stand 5: Supervision   Sit to Stand Details (indicate cue type and reason) cueing to lock rollator with transfers   Stand to Sit 5: Supervision   Ambulation/Gait   Ambulation/Gait Yes   Ambulation/Gait Assistance 5: Supervision;4: Min guard;4: Min assist;3: Mod assist   Ambulation/Gait Assistance Details Up to Mod A required for gait without AD; supervision required for gait with rollator.   Ambulation Distance (Feet) 300 Feet   Assistive device 4-wheeled walker;Parallel bars;None   Gait Pattern Step-through pattern;Decreased dorsiflexion - right;Poor foot clearance - right;Trunk flexed;Decreased hip/knee flexion - right;Decreased weight shift to left;Poor foot clearance - left   Ambulation Surface Level;Indoor   Curb 4: Min assist   Curb Details (indicate cue type and reason) Min guard for stability/balance; no cueing required for proper technique   Gait Comments Gait [phone] AD with min to mod A, tactile  cueing for midline posture (due to pt tendency toward arching back, maintaining COM posteriorly). When posture more aligned (decreased spinal extension), pt exhibited improved BLE step length, increased R hip/knee flexion during RLE advancement. Gait training with BUE support at parallel bars (progressing to single UE support then without UE support), pt performed ambulation over floor ladder to increase pt awareness of B step length , R hip/knee flexion during RLE advancement.    Neuro Re-ed    Neuro Re-ed Details  Semi tandem  on large rocker board (oriented for A/P instability), pt performed anterior reaching for low target (to increase pt comfort with COM more anterior) and placing targets on table to L. Performed x12 reps with multimodal cueing for visual attention to mirror to correct posture. Reviewed wall bump home exercise; focused on increasing B hip flexion to touch wall, and controlling B hip extension (preventing excessive extension) to lift hips off of wall. Pt performed x15 reps with min cueing for technique (focus on touching wall with hips as oppose to shoulders).                  PT Short Term Goals - 03/13/15 1309    PT SHORT TERM GOAL #1   Title Pt will perform home exercises with mod I using paper handout to indicate safe HEP compliance. Target date: 03/26/15   Status On-going   PT SHORT TERM GOAL #2   Title Perform Berg Balance Scale to assess stability with functional standing balance.  Target date: 03/26/15   Baseline 8/1: score 29/56   Status Achieved   PT SHORT TERM GOAL #3   Title Pt will decrease TUG time from 21.58 to 17.5 seconds or less to indicate improved efficiency of functional mobility.  Target date: 03/26/15   Baseline 21.58 sec with rollator   Status New   PT SHORT TERM GOAL #4   Title Pt will negotiate standard curb step with mod I using LRAD to indicate increased safety/independence with community mobility.  Target date: 03/26/15   Status On-going   PT SHORT TERM GOAL #5   Title Pt will ambulate at self-selected gait speed of 2.63 ft/sec to reach status of community ambulator. Target date: 03/26/15   Status On-going   PT SHORT TERM GOAL #6   Title Pt will perform sit > stand from standard chair without UE support to indicate increased functional LE strength.  Target date: 03/26/15   Status On-going   PT SHORT TERM GOAL #7   Title _________________________________________________________________________   PT SHORT TERM GOAL #8   Title  _________________________________________________________________   Status Partially Met   PT SHORT TERM GOAL #9   TITLE ____________________________________________________________________________________           PT Long Term Goals - 03/05/15 1735    PT LONG TERM GOAL #1   Title Pt/spouse will verbalize understanding of CVA warning signs to indicate understanding of situation in which to seek medical attention. Target date: 04/23/15.   Baseline ____________________________   Status On-going   PT LONG TERM GOAL #2   Title Pt will ambulate x200' over indoor, level surfaces with mod I, increased time without AD to indicate increased pt independence with household mobility, progress toward pt-stated goal. Target date: 04/23/15.   Baseline ________________________________   Status On-going   PT LONG TERM GOAL #3   Title Pt will perform TUG test in < 13.5 sconds to indicate decreased risk of falling. Target date: 04/23/15.   Baseline 21.5 seconds with rollator  Status On-going   PT LONG TERM GOAL #4   Title Pt will increase Berg Balance Scale score by 6 points from baseline to indicate improvement in functional standing balance. Target date: 04/23/15.   Baseline 8/1: scored 29/56   Status On-going   PT LONG TERM GOAL #5   Title Pt will ambulate x500' over outdoor, unlevel surfaces with mod I using LRAD with no overt LOB to indicate increased safety with community mobility. Target date: 04/23/15.   Status On-going   PT LONG TERM GOAL #6   Title ______________________________________________________________________________   Baseline ___________________________________________               Plan - 03/13/15 1309    Clinical Impression Statement Focused on gait training, increased postural/trunk control due to pt continuing to maintain COM posteriorly. Pt exhibits consistent HEP compliance. Noted inconsistent between-session carryover of cueing for safe use of rollator. Continue per  POC.   Pt will benefit from skilled therapeutic intervention in order to improve on the following deficits Abnormal gait;Decreased balance;Difficulty walking;Decreased strength;Postural dysfunction;Decreased knowledge of use of DME;Decreased activity tolerance;Decreased endurance;Other (comment)  impaired proprioception; impaired postural stability/control   Rehab Potential Good   Clinical Impairments Affecting Rehab Potential Currently undergoing chemotherapy 1x/week   PT Frequency 2x / week   PT Duration 8 weeks   PT Treatment/Interventions ADLs/Self Care Home Management;Gait training;Therapeutic exercise;Patient/family education;Stair training;Balance training;Functional mobility training;Neuromuscular re-education;Manual techniques;Therapeutic activities;DME Instruction;Vestibular;Orthotic Fit/Training   PT Next Visit Plan Consider using balance master for COG control. Address hip strategy, impaired selective control of R ankle dorsiflexion with R knee flexion.   PT Home Exercise Plan See Pt Instructions fro 8/4.   Consulted and Agree with Plan of Care Patient        Problem List Patient Active Problem List   Diagnosis Date Noted  . Basal ganglia infarction 02/12/2015  . Dizziness   . Expressive aphasia   . Past pointing   . Right leg weakness   . Acute ischemic stroke 02/11/2015  . CVA (cerebral infarction) 02/11/2015  . Aspiration pneumonia 02/11/2015  . Aphasia   . Difficulty speaking   . Speech difficult to understand   . Stroke   . Encounter for antineoplastic chemotherapy 01/31/2015  . Constipation 12/20/2014  . Rash 12/20/2014  . Cellulitis 12/06/2014  . Peripheral edema 12/06/2014  . Gait disorder 09/05/2014  . Multiple myeloma   . Paraplegia 06/21/2014  . Postoperative anemia due to acute blood loss 06/21/2014  . 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 Spine metastasis 06/17/2014  . Thoracic spine tumor 06/17/2014  . Metastatic bone cancer 06/15/2014     Billie Ruddy, PT, DPT [facility] [address] [place], [place], [address] Phone: [phone]   Fax:  [phone] 03/13/2015, 1:12 PM

## 2015-03-13 NOTE — Therapy (Signed)
[facility] [address] [place], [place], [address] Phone: [phone]   Fax:  [phone]  Speech Language Pathology Treatment  Patient Details  Name: Jeremiah Owens MRN: [id number] Date of Birth: January 27, 1930 Referring Provider:  Wenda Low, MD  Encounter Date: 03/13/2015      End of Session - 03/13/15 1334    Visit Number 2   Number of Visits 16   Date for SLP Re-Evaluation 04/25/15   SLP Start Time 1150   SLP Stop Time  1230   SLP Time Calculation (min) 40 min   Activity Tolerance Patient tolerated treatment well      Past Medical History  Diagnosis Date  . Compression fracture   . Allergy   . Anxiety   . Thyroid disease   . S/P radiation therapy 08/21/14-09/04/14    T4-6 25Gy/77f  . S/P radiation therapy 11/29/14-12/12/14    rt prox humerus/shoulder/scapula 25Gy/123f . Encounter for antineoplastic chemotherapy 01/31/2015  . Melanoma   . Bone cancer     t_spine T-5  . Prostate cancer 2002  . Skin cancer 1994    melanoma  right neck   . Multiple myeloma     Past Surgical History  Procedure Laterality Date  . Hernia repair    . Posterior cervical fusion/foraminotomy N/A 06/17/2014    Procedure: Thoracic five laminectomy for epidural tumor resection Thoracic 4-7 posterior lateral arthrodesis, segmental pedicle screw fixation.;  Surgeon: KyAshok PallMD;  [facility];  Service: Neurosurgery;  Laterality: N/A;    There were no vitals filed for this visit.  Visit Diagnosis: Pharyngeal dysphagia  Expressive aphasia      Subjective Assessment - 03/13/15 1216    Subjective "I have a hard time originating stuff." (re: SLP question about what he did in therapies prior to ST today)               ADULT SLP TREATMENT - 03/13/15 1217    General Information   Behavior/Cognition Alert;Cooperative;Pleasant mood   Treatment Provided   Treatment provided Dysphagia;Cognitive-Linquistic   Dysphagia Treatment   Other treatment/comments SLP educated/taught pt how to do his HEP. Pt req'd cues as in "education'" section.   Pain Assessment   Pain Assessment No/denies pain   Cognitive-Linquistic Treatment   Treatment focused on Aphasia   Skilled Treatment SLP facilitated improved verbal expression from pt today by engagning him in tasks requiring sentence responses. PT benefitted most from phonemic cues - semantic cues were not as helpful. These mod verbal cues were necessary approx 50% of the time.   Assessment / Recommendations / Plan   Plan Continue with current plan of care   Dysphagia Recommendations   Diet recommendations --  as directed on pt's last modified barium swallow exam          SLP Education - 03/13/15 1213    Education provided Yes   Education Details HEP for swallowing   Person(s) Educated Patient   Methods Explanation;Demonstration;Verbal cues   Comprehension Verbal cues required;Returned demonstration;Verbalized understanding  demonstration          SLP Short Term Goals - 03/13/15 1145    SLP SHORT TERM GOAL #1   Title pt will produce sentence responses in mod complex tasks 80% and rare min A   Time 4   Period Weeks   Status On-going   SLP SHORT TERM GOAL #2   Title pt will demo HEP for swallowing with rare min A  Time 4   Period Weeks   Status On-going   SLP SHORT TERM GOAL #3   Title pt will demo compensatory strategies for anomia with occasasional min A   Time 4   Period Weeks   Status On-going          SLP Long Term Goals - 03/13/15 1146    SLP LONG TERM GOAL #1   Title pt will demo 10 minutes mod complex conversation fucntionally with modified indpendence   Time 8   Period Weeks   Status On-going   SLP LONG TERM GOAL #2   Title pt will demo HEP for swallowing with modified independence   Time 8   Period Weeks   Status On-going          Plan - 03/13/15 1335    Clinical Impression Statement Pt now has HEP for  dysphagia, and list of precautions for mealtimes. He told SLP one swallow precuation (turn head to rt) with min verbal cues. Pt's success at the sentence level was much like evaluation, requiring SLP cues. Skilled ST to remain necessary to maximize pt's verbal expression/expressive language, and improve swallowing skills.    Speech Therapy Frequency 2x / week   Duration --  8 weeks   Treatment/Interventions Internal/external aids;Compensatory strategies;SLP instruction and feedback;Patient/family education;Functional tasks;Cueing hierarchy;Pharyngeal strengthening exercises;Oral motor exercises;Compensatory techniques;Diet toleration management by SLP   Potential to Achieve Goals Good   SLP Home Exercise Plan provided today   Consulted and Agree with Plan of Care Patient        Problem List Patient Active Problem List   Diagnosis Date Noted  . Basal ganglia infarction 02/12/2015  . Dizziness   . Expressive aphasia   . Past pointing   . Right leg weakness   . Acute ischemic stroke 02/11/2015  . CVA (cerebral infarction) 02/11/2015  . Aspiration pneumonia 02/11/2015  . Aphasia   . Difficulty speaking   . Speech difficult to understand   . Stroke   . Encounter for antineoplastic chemotherapy 01/31/2015  . Constipation 12/20/2014  . Rash 12/20/2014  . Cellulitis 12/06/2014  . Peripheral edema 12/06/2014  . Gait disorder 09/05/2014  . Multiple myeloma   . Paraplegia 06/21/2014  . Postoperative anemia due to acute blood loss 06/21/2014  . 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 Spine metastasis 06/17/2014  . Thoracic spine tumor 06/17/2014  . Metastatic bone cancer 06/15/2014    [facility] , MS, CCC-SLP  03/13/2015, 1:38 PM  [facility] [address] [place], [place], [address] Phone: [phone]   Fax:  [phone]

## 2015-03-13 NOTE — Therapy (Signed)
[facility] [address] [place], [place], [address] Phone: [phone]   Fax:  [phone]  Occupational Therapy Treatment  Patient Details  Name: Jeremiah Owens MRN: [id number] Date of Birth: 1930/04/05 Referring Provider:  Wenda Low, MD  Encounter Date: 03/13/2015      OT End of Session - 03/13/15 1109    Visit Number 4   Number of Visits 9   Date for OT Re-Evaluation 03/29/15   Authorization Type MCR - G CODE NEEDED   Authorization - Visit Number 4   Authorization - Number of Visits 10   OT Start Time 6811   OT Stop Time 1100   OT Time Calculation (min) 45 min   Activity Tolerance Patient tolerated treatment well      Past Medical History  Diagnosis Date  . Compression fracture   . Allergy   . Anxiety   . Thyroid disease   . S/P radiation therapy 08/21/14-09/04/14    T4-6 25Gy/52f  . S/P radiation therapy 11/29/14-12/12/14    rt prox humerus/shoulder/scapula 25Gy/172f . Encounter for antineoplastic chemotherapy 01/31/2015  . Melanoma   . Bone cancer     t_spine T-5  . Prostate cancer 2002  . Skin cancer 1994    melanoma  right neck   . Multiple myeloma     Past Surgical History  Procedure Laterality Date  . Hernia repair    . Posterior cervical fusion/foraminotomy N/A 06/17/2014    Procedure: Thoracic five laminectomy for epidural tumor resection Thoracic 4-7 posterior lateral arthrodesis, segmental pedicle screw fixation.;  Surgeon: KyAshok PallMD;  [facility];  Service: Neurosurgery;  Laterality: N/A;    There were no vitals filed for this visit.  Visit Diagnosis:  Lack of coordination  Activity intolerance  Decreased functional mobility and endurance      Subjective Assessment - 03/13/15 1019    Pertinent History Basal Ganglia stroke 02/10/15, active CA, LUE shoulder impairments premorbidly approx. 75%    Limitations ** NO heat modalities b/c actively getting chemotherapy    Repetition Increases Symptoms   Patient Stated Goals get my arm and hand better   Currently in Pain? No/denies                      OT Treatments/Exercises (OP) - 03/13/15 0001    ADLs   Functional Mobility Pt practiced use of rollator negotiation in kitchen and safety techniques while reaching in low and high cabinets, getting items out of refrigerator, etc. Pt instructed to always have hand on countertop for stability/balance and fall prevention when ambulating in kitchen. Pt return demo with min cues   Shoulder Exercises: ROM/Strengthening   UBE (Upper Arm Bike) x 8 min. Level 2 for BUE for strength/endurance   Fine Motor Coordination   Fine Motor Coordination In hand manipuation training   In Hand Manipulation Training to translate stress balls Rt hand, then Lt hand with min difficulty.    Other Fine Motor Exercises Tossing ball Rt then Lt hand with max difficulty to leave hand (Lt worse than Rt). Pt given cues to go slower and stop b/t tosses which seems to help some in Rt hand, but not Lt                  OT Short Term Goals - 03/05/15 1054    OT SHORT TERM GOAL #1   Title NONE  OT Long Term Goals - 03/08/15 1425    OT LONG TERM GOAL #1   Title Independent w/ coordination HEP   Time 4   Period Weeks   Status Achieved   OT LONG TERM GOAL #2   Title Pt to perform dynamic standing tasks for 10 minutes or greater w/o LOB or rest break in prep for ADLS   Time 4   Period Weeks   Status New   OT LONG TERM GOAL #3   Title Pt to write check with 75% or greater legibility   Time 4   Period Weeks   Status On-going   OT LONG TERM GOAL #4   Title Improve coordination as evidenced by performing 9 hole peg test in 32 sec. or less Rt hand   Baseline 38.78 sec   Time 4   Period Weeks   Status New   OT LONG TERM GOAL #5   Title Pt to perform simple cooking activity safely mod I level for 15 min. or greater    Time 4   Period Weeks   Status  New               Plan - 03/13/15 1109    Clinical Impression Statement Pt with limited ability to toss ball for gross motor coordination. Pt demo good safety techniques when ambulating in kitchen in prep for cooking with min cues.    Plan continue dynamic standing balance, coordination, strength/endurance   OT Home Exercise Plan coordination HEP issued 03/05/15.    Consulted and Agree with Plan of Care Patient        Problem List Patient Active Problem List   Diagnosis Date Noted  . Basal ganglia infarction 02/12/2015  . Dizziness   . Expressive aphasia   . Past pointing   . Right leg weakness   . Acute ischemic stroke 02/11/2015  . CVA (cerebral infarction) 02/11/2015  . Aspiration pneumonia 02/11/2015  . Aphasia   . Difficulty speaking   . Speech difficult to understand   . Stroke   . Encounter for antineoplastic chemotherapy 01/31/2015  . Constipation 12/20/2014  . Rash 12/20/2014  . Cellulitis 12/06/2014  . Peripheral edema 12/06/2014  . Gait disorder 09/05/2014  . Multiple myeloma   . Paraplegia 06/21/2014  . Postoperative anemia due to acute blood loss 06/21/2014  . 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 Spine metastasis 06/17/2014  . Thoracic spine tumor 06/17/2014  . Metastatic bone cancer 06/15/2014    Carey Bullocks, OTR/L 03/13/2015, 11:13 AM  [facility] [address] [place], [place], [address] Phone: [phone]   Fax:  [phone]

## 2015-03-14 ENCOUNTER — Telehealth: Payer: Self-pay | Admitting: Internal Medicine

## 2015-03-14 ENCOUNTER — Ambulatory Visit (HOSPITAL_BASED_OUTPATIENT_CLINIC_OR_DEPARTMENT_OTHER): Payer: Medicare Other

## 2015-03-14 ENCOUNTER — Ambulatory Visit (HOSPITAL_BASED_OUTPATIENT_CLINIC_OR_DEPARTMENT_OTHER): Payer: Medicare Other | Admitting: Nurse Practitioner

## 2015-03-14 ENCOUNTER — Other Ambulatory Visit (HOSPITAL_BASED_OUTPATIENT_CLINIC_OR_DEPARTMENT_OTHER): Payer: Medicare Other

## 2015-03-14 VITALS — BP 123/44 | HR 77 | Temp 98.1°F | Resp 18 | Ht 70.0 in | Wt 161.2 lb

## 2015-03-14 DIAGNOSIS — C9000 Multiple myeloma not having achieved remission: Secondary | ICD-10-CM

## 2015-03-14 DIAGNOSIS — Z5112 Encounter for antineoplastic immunotherapy: Secondary | ICD-10-CM | POA: Diagnosis present

## 2015-03-14 LAB — CBC WITH DIFFERENTIAL/PLATELET
BASO%: 0.3 % (ref 0.0–2.0)
Basophils Absolute: 0 10*3/uL (ref 0.0–0.1)
EOS%: 1.3 % (ref 0.0–7.0)
Eosinophils Absolute: 0 10*3/uL (ref 0.0–0.5)
HCT: 37.3 % — ABNORMAL LOW (ref 38.4–49.9)
HGB: 12.5 g/dL — ABNORMAL LOW (ref 13.0–17.1)
LYMPH#: 0.4 10*3/uL — AB (ref 0.9–3.3)
LYMPH%: 10.5 % — ABNORMAL LOW (ref 14.0–49.0)
MCH: 32.5 pg (ref 27.2–33.4)
MCHC: 33.6 g/dL (ref 32.0–36.0)
MCV: 96.6 fL (ref 79.3–98.0)
MONO#: 0 10*3/uL — ABNORMAL LOW (ref 0.1–0.9)
MONO%: 0.8 % (ref 0.0–14.0)
NEUT%: 87.1 % — ABNORMAL HIGH (ref 39.0–75.0)
NEUTROS ABS: 3 10*3/uL (ref 1.5–6.5)
Platelets: 101 10*3/uL — ABNORMAL LOW (ref 140–400)
RBC: 3.86 10*6/uL — AB (ref 4.20–5.82)
RDW: 17.8 % — ABNORMAL HIGH (ref 11.0–14.6)
WBC: 3.5 10*3/uL — ABNORMAL LOW (ref 4.0–10.3)

## 2015-03-14 LAB — COMPREHENSIVE METABOLIC PANEL (CC13)
ALBUMIN: 3.8 g/dL (ref 3.5–5.0)
ALK PHOS: 63 U/L (ref 40–150)
ALT: 23 U/L (ref 0–55)
AST: 15 U/L (ref 5–34)
Anion Gap: 9 mEq/L (ref 3–11)
BUN: 16.7 mg/dL (ref 7.0–26.0)
CALCIUM: 8.7 mg/dL (ref 8.4–10.4)
CHLORIDE: 99 meq/L (ref 98–109)
CO2: 30 mEq/L — ABNORMAL HIGH (ref 22–29)
Creatinine: 1.1 mg/dL (ref 0.7–1.3)
EGFR: 62 mL/min/{1.73_m2} — AB (ref >90)
Glucose: 143 mg/dl — ABNORMAL HIGH (ref 70–140)
Potassium: 3.8 mEq/L (ref 3.5–5.1)
Sodium: 138 mEq/L (ref 136–145)
Total Bilirubin: 0.74 mg/dL (ref 0.20–1.20)
Total Protein: 5.9 g/dL — ABNORMAL LOW (ref 6.4–8.3)

## 2015-03-14 MED ORDER — ONDANSETRON HCL 8 MG PO TABS
ORAL_TABLET | ORAL | Status: AC
  Filled 2015-03-14: qty 1

## 2015-03-14 MED ORDER — ONDANSETRON 8 MG PO TBDP
8.0000 mg | ORAL_TABLET | Freq: Once | ORAL | Status: AC
  Administered 2015-03-14: 8 mg via ORAL
  Filled 2015-03-14: qty 1

## 2015-03-14 MED ORDER — BORTEZOMIB CHEMO SQ INJECTION 3.5 MG (2.5MG/ML)
1.3000 mg/m2 | Freq: Once | INTRAMUSCULAR | Status: AC
  Administered 2015-03-14: 2.5 mg via SUBCUTANEOUS
  Filled 2015-03-14: qty 2.5

## 2015-03-14 NOTE — Patient Instructions (Signed)
Bortezomib injection What is this medicine? BORTEZOMIB (bor TEZ oh mib) is a chemotherapy drug. It slows the growth of cancer cells. This medicine is used to treat multiple myeloma, and certain lymphomas, such as mantle-cell lymphoma. This medicine may be used for other purposes; ask your health care provider or pharmacist if you have questions. COMMON BRAND NAME(S): Velcade What should I tell my health care provider before I take this medicine? They need to know if you have any of these conditions: -diabetes -heart disease -irregular heartbeat -liver disease -on hemodialysis -low blood counts, like low white blood cells, platelets, or hemoglobin -peripheral neuropathy -taking medicine for blood pressure -an unusual or allergic reaction to bortezomib, mannitol, boron, other medicines, foods, dyes, or preservatives -pregnant or trying to get pregnant -breast-feeding How should I use this medicine? This medicine is for injection into a vein or for injection under the skin. It is given by a health care professional in a hospital or clinic setting. Talk to your pediatrician regarding the use of this medicine in children. Special care may be needed. Overdosage: If you think you have taken too much of this medicine contact a poison control center or emergency room at once. NOTE: This medicine is only for you. Do not share this medicine with others. What if I miss a dose? It is important not to miss your dose. Call your doctor or health care professional if you are unable to keep an appointment. What may interact with this medicine? This medicine may interact with the following medications: -ketoconazole -rifampin -ritonavir -St. John's Wort This list may not describe all possible interactions. Give your health care provider a list of all the medicines, herbs, non-prescription drugs, or dietary supplements you use. Also tell them if you smoke, drink alcohol, or use illegal drugs. Some items  may interact with your medicine. What should I watch for while using this medicine? Visit your doctor for checks on your progress. This drug may make you feel generally unwell. This is not uncommon, as chemotherapy can affect healthy cells as well as cancer cells. Report any side effects. Continue your course of treatment even though you feel ill unless your doctor tells you to stop. You may get drowsy or dizzy. Do not drive, use machinery, or do anything that needs mental alertness until you know how this medicine affects you. Do not stand or sit up quickly, especially if you are an older patient. This reduces the risk of dizzy or fainting spells. In some cases, you may be given additional medicines to help with side effects. Follow all directions for their use. Call your doctor or health care professional for advice if you get a fever, chills or sore throat, or other symptoms of a cold or flu. Do not treat yourself. This drug decreases your body's ability to fight infections. Try to avoid being around people who are sick. This medicine may increase your risk to bruise or bleed. Call your doctor or health care professional if you notice any unusual bleeding. You may need blood work done while you are taking this medicine. In some patients, this medicine may cause a serious brain infection that may cause death. If you have any problems seeing, thinking, speaking, walking, or standing, tell your doctor right away. If you cannot reach your doctor, urgently seek other source of medical care. Do not become pregnant while taking this medicine. Women should inform their doctor if they wish to become pregnant or think they might be pregnant. There is   a potential for serious side effects to an unborn child. Talk to your health care professional or pharmacist for more information. Do not breast-feed an infant while taking this medicine. Check with your doctor or health care professional if you get an attack of  severe diarrhea, nausea and vomiting, or if you sweat a lot. The loss of too much body fluid can make it dangerous for you to take this medicine. What side effects may I notice from receiving this medicine? Side effects that you should report to your doctor or health care professional as soon as possible: -allergic reactions like skin rash, itching or hives, swelling of the face, lips, or tongue -breathing problems -changes in hearing -changes in vision -fast, irregular heartbeat -feeling faint or lightheaded, falls -pain, tingling, numbness in the hands or feet -right upper belly pain -seizures -swelling of the ankles, feet, hands -unusual bleeding or bruising -unusually weak or tired -vomiting -yellowing of the eyes or skin Side effects that usually do not require medical attention (report to your doctor or health care professional if they continue or are bothersome): -changes in emotions or moods -constipation -diarrhea -loss of appetite -headache -irritation at site where injected -nausea This list may not describe all possible side effects. Call your doctor for medical advice about side effects. You may report side effects to FDA at 1-800-FDA-1088. Where should I keep my medicine? This drug is given in a hospital or clinic and will not be stored at home. NOTE: This sheet is a summary. It may not cover all possible information. If you have questions about this medicine, talk to your doctor, pharmacist, or health care provider.  2015, Elsevier/Gold Standard. (2013-05-16 12:46:32)  

## 2015-03-14 NOTE — Telephone Encounter (Signed)
Pt confirmed labs/ov per 08/10 POF, gave pt avs and calendar.... KJ, sent msg to add chemo and Zometa, also sent msg to MD about getting back on Zometa due to being in the hospital... Kj

## 2015-03-14 NOTE — Telephone Encounter (Signed)
Pt confirmed labs/ov per 08/10 POF, gave pt AVS and Calendar

## 2015-03-14 NOTE — Progress Notes (Signed)
  [facility] OFFICE PROGRESS NOTE   DIAGNOSIS: Multiple myeloma diagnosed in November 2015  PRIOR THERAPY:  1) Status post Thoracic five laminectomy for epidural tumor resection, Thoracic 4-7 posterior lateral arthrodesis,  segmental pedicle screw fixation T4-T7 Globus instrumentation under the care of Dr. Christella Noa on 06/18/2014. 2) Systemic chemotherapy with Velcade 1.3 MG/M2 subcutaneously weekly in addition to Decadron 40 mg by mouth on weekly basis. Status post 11 cycles. 3) palliative radiotherapy to the right proximal humerus, shoulder and scapula under the care of Dr. Lisbeth Renshaw completed on 12/12/2014.  CURRENT THERAPY:  1) Systemic chemotherapy with Velcade 1.3 MG/M2 subcutaneously weekly, Decadron 40 mg by mouth weekly in addition to Revlimid 25 MG by mouth daily for 21 days every 4 weeks, status post 5 cycles. 2) Zometa 4 mg IV every month. First dose 11/01/2014.     INTERVAL HISTORY:   Mr. Voit returns as scheduled. He continues weekly Velcade/dexamethasone and Revlimid 25 mg for 21 days every 4 weeks. He denies nausea/vomiting. No mouth sores. No rash. He is intermittently constipated. He takes Senokot, Miralax and drinks juice with good results. He continues physical therapy, occupational therapy and speech therapy he is swallowing without difficulty. He continues to experience expressive aphasia. Right shoulder pain resolved following the course of radiation.  Objective:  Vital signs in last 24 hours:  Blood pressure 123/44, pulse 77, temperature 98.1 F (36.7 C), temperature source Oral, resp. rate 18, height _0  (1.778 m), weight 161 lb 3.2 oz (73.12 kg), SpO2 99 %.    HEENT: No thrush or ulcers. Resp: Lungs clear bilaterally. Cardio: Regular rate and rhythm. GI: Abdomen soft and nontender. No organomegaly. Vascular: No leg edema. Neuro: Alert and oriented. Partial expressive aphasia.  Skin: No rash.    Lab Results:  Lab Results  Component  Value Date   WBC 3.5* 03/14/2015   HGB 12.5* 03/14/2015   HCT 37.3* 03/14/2015   MCV 96.6 03/14/2015   PLT 101* 03/14/2015   NEUTROABS 3.0 03/14/2015    Imaging:  No results found.  Medications: I have reviewed the patient's current medications.  Assessment/Plan: 1. Multiple myeloma status post treatment with subcutaneous Velcade and Decadron 11 weekly doses 07/19/2014 through 09/27/2014 with subsequent discontinuation due to disease progression. Treatment continued with weekly subcutaneous Velcade/weekly dexamethasone and initiation of Revlimid 21 days on/7 days off every 4 weeks 2. Right shoulder pain secondary to #1. Resolved following the course of radiation. 3. Left basal ganglia infarct July 2016.   Disposition: Plan to continue weekly subcutaneous Velcade, weekly dexamethasone and Revlimid 21 days on followed by a 7 day break every 4 weeks. He will return for a follow-up visit in 2 weeks. He will contact the office in the interim with any problems.  Ned Card ANP/GNP-BC   03/14/2015  2:53 PM

## 2015-03-15 ENCOUNTER — Ambulatory Visit: Payer: Medicare Other | Admitting: Occupational Therapy

## 2015-03-15 ENCOUNTER — Ambulatory Visit: Payer: Medicare Other | Admitting: Speech Pathology

## 2015-03-15 ENCOUNTER — Ambulatory Visit: Payer: Medicare Other | Admitting: Physical Therapy

## 2015-03-15 DIAGNOSIS — M6281 Muscle weakness (generalized): Secondary | ICD-10-CM | POA: Diagnosis not present

## 2015-03-15 DIAGNOSIS — R269 Unspecified abnormalities of gait and mobility: Secondary | ICD-10-CM

## 2015-03-15 DIAGNOSIS — R1313 Dysphagia, pharyngeal phase: Secondary | ICD-10-CM | POA: Diagnosis not present

## 2015-03-15 DIAGNOSIS — R2681 Unsteadiness on feet: Secondary | ICD-10-CM

## 2015-03-15 DIAGNOSIS — R279 Unspecified lack of coordination: Secondary | ICD-10-CM | POA: Diagnosis not present

## 2015-03-15 DIAGNOSIS — R2991 Unspecified symptoms and signs involving the musculoskeletal system: Secondary | ICD-10-CM

## 2015-03-15 DIAGNOSIS — R4701 Aphasia: Secondary | ICD-10-CM | POA: Diagnosis not present

## 2015-03-15 DIAGNOSIS — R29818 Other symptoms and signs involving the nervous system: Secondary | ICD-10-CM

## 2015-03-15 DIAGNOSIS — R6889 Other general symptoms and signs: Secondary | ICD-10-CM | POA: Diagnosis not present

## 2015-03-15 NOTE — Therapy (Signed)
[facility] [address] [place], [place], [address] Phone: [phone]   Fax:  [phone]  Physical Therapy Treatment  Patient Details  Name: Jeremiah Owens MRN: [id number] Date of Birth: [birth year] Referring Provider:  Wenda Low, MD  Encounter Date: 03/15/2015      PT End of Session - 03/15/15 2059    Visit Number 5   Number of Visits 17   Date for PT Re-Evaluation 04/27/15   Authorization Type Medicare [employer] - G codes required every 10 visits   PT Start Time 4174   PT Stop Time 1530   PT Time Calculation (min) 45 min   Equipment Utilized During Treatment Gait belt   Activity Tolerance Patient tolerated treatment well   Behavior During Therapy [facility] for tasks assessed/performed      Past Medical History  Diagnosis Date  . Compression fracture   . Allergy   . Anxiety   . Thyroid disease   . S/P radiation therapy 08/21/14-09/04/14    T4-6 25Gy/70f  . S/P radiation therapy 11/29/14-12/12/14    rt prox humerus/shoulder/scapula 25Gy/17f . Encounter for antineoplastic chemotherapy 01/31/2015  . Melanoma   . Bone cancer     t_spine T-5  . Prostate cancer 2002  . Skin cancer 1994    melanoma  right neck   . Multiple myeloma     Past Surgical History  Procedure Laterality Date  . Hernia repair    . Posterior cervical fusion/foraminotomy N/A 06/17/2014    Procedure: Thoracic five laminectomy for epidural tumor resection Thoracic 4-7 posterior lateral arthrodesis, segmental pedicle screw fixation.;  Surgeon: KyAshok PallMD;  [facility];  Service: Neurosurgery;  Laterality: N/A;    There were no vitals filed for this visit.  Visit Diagnosis:  Abnormality of gait  Lack of coordination  Unsteadiness      Subjective Assessment - 03/15/15 1452    Subjective Pt denies falls and reports no significant changes. HEP going well.   Pertinent History Basal ganglia infarct 02/10/15 with  expressive aphasia; prostate cancer, skin cancer, multiple myeloma (active; chemotherapy on Wednesdays)   Limitations Walking   Patient Stated Goals "To be able to function without the walker (rollator); better balance."   Currently in Pain? No/denies                         [facility] Adult PT Treatment/Exercise - 03/15/15 0001    Bed Mobility   Supine to Sit 6: Modified independent (Device/Increase time)   Sit to Supine 6: Modified independent (Device/Increase time)   Transfers   Transfers Sit to Stand;Stand to Sit   Sit to Stand 5: Supervision   Sit to Stand Details (indicate cue type and reason) Pt consistently locked rolllator breaks without cueing required`   Stand to Sit 5: Supervision   Stand to Sit Details required`   Ambulation/Gait   Ambulation/Gait Yes   Ambulation/Gait Assistance 4: Min guard;4: Min assist   Ambulation Distance (Feet) 500 Feet   Assistive device None   Gait Pattern Step-through pattern;Decreased dorsiflexion - right;Poor foot clearance - right;Trunk flexed;Decreased hip/knee flexion - right;Decreased weight shift to left;Poor foot clearance - left   Ambulation Surface Level;Indoor   Gait Comments Pt performed multiple gait trials x115'-200' without AD to decrease reliance on somatorsensory input. Noted improved postural alignment/control (less excessive trunk extension). Cueing focused on increasing lateral weight shift to L side (especially with L turns), increasing R hip/knee  flexion and ankle dorsiflexion during RLE advancement, and on adequate R heel strike during initial contact.   Neuro Re-ed    Neuro Re-ed Details  Standing on foam beam (to decrease effectiveness of ankle strategy, focus on hip strategy) without UE support, pt performed anterior reaching for low targets followed by trunk rotation to R, then to L x24 reps per side. Mirror anterior to pt for visual feedback, postural awareness. Cueing focused on COG control, grading anterior  weight shift, increasing hip/knee flexion during reaching for low targets. Transitioned to supine on mat table, performing L LE D2 PNF slow reversal hold during flexion to promote R hip/knee flexion during RLE advancement. Also focused on selective control of R ankle dorsiflexion for R toe clearance.                  PT Short Term Goals - 03/13/15 1309    PT SHORT TERM GOAL #1   Title Pt will perform home exercises with mod I using paper handout to indicate safe HEP compliance. Target date: 03/26/15   Status On-going   PT SHORT TERM GOAL #2   Title Perform Berg Balance Scale to assess stability with functional standing balance.  Target date: 03/26/15   Baseline 8/1: score 29/56   Status Achieved   PT SHORT TERM GOAL #3   Title Pt will decrease TUG time from 21.58 to 17.5 seconds or less to indicate improved efficiency of functional mobility.  Target date: 03/26/15   Baseline 21.58 sec with rollator   Status New   PT SHORT TERM GOAL #4   Title Pt will negotiate standard curb step with mod I using LRAD to indicate increased safety/independence with community mobility.  Target date: 03/26/15   Status On-going   PT SHORT TERM GOAL #5   Title Pt will ambulate at self-selected gait speed of 2.63 ft/sec to reach status of community ambulator. Target date: 03/26/15   Status On-going   PT SHORT TERM GOAL #6   Title Pt will perform sit > stand from standard chair without UE support to indicate increased functional LE strength.  Target date: 03/26/15   Status On-going   PT SHORT TERM GOAL #7   Title _________________________________________________________________________   PT SHORT TERM GOAL #8   Title _________________________________________________________________   Status Partially Met   PT SHORT TERM GOAL #9   TITLE ____________________________________________________________________________________           PT Long Term Goals - 03/05/15 1735    PT LONG TERM GOAL #1   Title  Pt/spouse will verbalize understanding of CVA warning signs to indicate understanding of situation in which to seek medical attention. Target date: 04/23/15.   Baseline ____________________________   Status On-going   PT LONG TERM GOAL #2   Title Pt will ambulate x200' over indoor, level surfaces with mod I, increased time without AD to indicate increased pt independence with household mobility, progress toward pt-stated goal. Target date: 04/23/15.   Baseline ________________________________   Status On-going   PT LONG TERM GOAL #3   Title Pt will perform TUG test in < 13.5 sconds to indicate decreased risk of falling. Target date: 04/23/15.   Baseline 21.5 seconds with rollator   Status On-going   PT LONG TERM GOAL #4   Title Pt will increase Berg Balance Scale score by 6 points from baseline to indicate improvement in functional standing balance. Target date: 04/23/15.   Baseline 8/1: scored 29/56   Status On-going   PT LONG  TERM GOAL #5   Title Pt will ambulate x500' over outdoor, unlevel surfaces with mod I using LRAD with no overt LOB to indicate increased safety with community mobility. Target date: 04/23/15.   Status On-going   PT LONG TERM GOAL #6   Title ______________________________________________________________________________   Baseline ___________________________________________               Plan - 03/15/15 2100    Clinical Impression Statement Noted improvement in postural alignment/control during dynamic standing activities during this session. Pt continues to demonstate decreased lateral weight shift to L side, decreased selective control of R ankle dorsiflexion during gait. Continue per POC.   Pt will benefit from skilled therapeutic intervention in order to improve on the following deficits Abnormal gait;Decreased balance;Difficulty walking;Decreased strength;Postural dysfunction;Decreased knowledge of use of DME;Decreased activity tolerance;Decreased  endurance;Other (comment)  impaired proprioception; impaired postural stability/control   Rehab Potential Good   Clinical Impairments Affecting Rehab Potential Currently undergoing chemotherapy 1x/week   PT Frequency 2x / week   PT Duration 8 weeks   PT Treatment/Interventions ADLs/Self Care Home Management;Gait training;Therapeutic exercise;Patient/family education;Stair training;Balance training;Functional mobility training;Neuromuscular re-education;Manual techniques;Therapeutic activities;DME Instruction;Vestibular;Orthotic Fit/Training   PT Next Visit Plan Consider using balance master for COG control. Address hip strategy, impaired selective control of R ankle dorsiflexion with R knee flexion.   PT Home Exercise Plan See Pt Instructions fro 8/4.   Consulted and Agree with Plan of Care Patient   Family Member Consulted wife, Denice Paradise        Problem List Patient Active Problem List   Diagnosis Date Noted  . Basal ganglia infarction 02/12/2015  . Dizziness   . Expressive aphasia   . Past pointing   . Right leg weakness   . Acute ischemic stroke 02/11/2015  . CVA (cerebral infarction) 02/11/2015  . Aspiration pneumonia 02/11/2015  . Aphasia   . Difficulty speaking   . Speech difficult to understand   . Stroke   . Encounter for antineoplastic chemotherapy 01/31/2015  . Constipation 12/20/2014  . Rash 12/20/2014  . Cellulitis 12/06/2014  . Peripheral edema 12/06/2014  . Gait disorder 09/05/2014  . Multiple myeloma   . Paraplegia 06/21/2014  . Postoperative anemia due to acute blood loss 06/21/2014  . 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 Spine metastasis 06/17/2014  . Thoracic spine tumor 06/17/2014  . Metastatic bone cancer 06/15/2014    Billie Ruddy, PT, DPT [facility] [address] [place], [place], [address] Phone: [phone]   Fax:  [phone] 03/15/2015, 9:04 PM

## 2015-03-15 NOTE — Therapy (Signed)
[facility] [address] [place], [place], [address] Phone: [phone]   Fax:  [phone]  Speech Language Pathology Treatment  Patient Details  Name: Jeremiah Owens MRN: [id number] Date of Birth: 1929-12-28 Referring Provider:  Wenda Low, MD  Encounter Date: 03/15/2015      End of Session - 03/15/15 1457    Visit Number 3   Number of Visits 16   Date for SLP Re-Evaluation 04/25/15   SLP Start Time 65   SLP Stop Time  1444   SLP Time Calculation (min) 44 min   Activity Tolerance Patient tolerated treatment well      Past Medical History  Diagnosis Date  . Compression fracture   . Allergy   . Anxiety   . Thyroid disease   . S/P radiation therapy 08/21/14-09/04/14    T4-6 25Gy/91f  . S/P radiation therapy 11/29/14-12/12/14    rt prox humerus/shoulder/scapula 25Gy/147f . Encounter for antineoplastic chemotherapy 01/31/2015  . Melanoma   . Bone cancer     t_spine T-5  . Prostate cancer 2002  . Skin cancer 1994    melanoma  right neck   . Multiple myeloma     Past Surgical History  Procedure Laterality Date  . Hernia repair    . Posterior cervical fusion/foraminotomy N/A 06/17/2014    Procedure: Thoracic five laminectomy for epidural tumor resection Thoracic 4-7 posterior lateral arthrodesis, segmental pedicle screw fixation.;  Surgeon: KyAshok PallMD;  [facility];  Service: Neurosurgery;  Laterality: N/A;    There were no vitals filed for this visit.  Visit Diagnosis: Pharyngeal dysphagia  Expressive aphasia      Subjective Assessment - 03/15/15 1403    Subjective "The homework was a little bit harder - I was to do it aloud not write"   Currently in Pain? No/denies               ADULT SLP TREATMENT - 03/15/15 1405    General Information   Behavior/Cognition Alert;Cooperative;Pleasant mood   Treatment Provided   Treatment provided Cognitive-Linquistic;Dysphagia   Cognitive-Linquistic Treatment   Treatment focused on Aphasia   Skilled Treatment Facilitated verbal expression naming 3  causes for  givien problems  with  occasional min questioning cues, extended time, semantic cues . Pt named 7 items for a given category with extended time  and cues to describe item when he can't think of theword. Pt also required extended time and usual cues to generate more than 5-6 items.  Pt performed dysphagia  with occasional min cues, instruction and modeling. Pt required mod to max  A for gargle exercises.  Simple conversation 70% accurage with usual questioning cues to clarify and cue for compensations for aphasia.   Assessment / Recommendations / Plan   Plan Continue with current plan of care   Progression Toward Goals   Progression toward goals Progressing toward goals          SLP Education - 03/15/15 1454    Education provided Yes   Education Details dysphagia HEP, compensationsn for aphasia   Person(s) Educated Patient   Methods Explanation;Demonstration   Comprehension Verbalized understanding;Returned demonstration;Verbal cues required          SLP Short Term Goals - 03/15/15 1457    SLP SHORT TERM GOAL #1   Title pt will produce sentence responses in mod complex tasks 80% and rare min A   Time 4   Period Weeks   Status On-going  SLP SHORT TERM GOAL #2   Title pt will demo HEP for swallowing with rare min A   Time 4   Period Weeks   Status On-going   SLP SHORT TERM GOAL #3   Title pt will demo compensatory strategies for anomia with occasasional min A   Time 4   Period Weeks   Status On-going          SLP Long Term Goals - 03/15/15 1457    SLP LONG TERM GOAL #1   Title pt will demo 10 minutes mod complex conversation fucntionally with modified indpendence   Time 8   Period Weeks   Status On-going   SLP LONG TERM GOAL #2   Title pt will demo HEP for swallowing with modified independence   Time 8   Period Weeks   Status  On-going          Plan - 03/15/15 1455    Clinical Impression Statement Pt required  occasional to usual min A for use of compensations for aphasia. Min A for dysphagia HEP. Word finding episodes persist frequently in conversation. Continue skilled ST to amximize verbal expressoin and safety of swallow   Speech Therapy Frequency 2x / week   Treatment/Interventions Internal/external aids;Compensatory strategies;SLP instruction and feedback;Patient/family education;Functional tasks;Cueing hierarchy;Pharyngeal strengthening exercises;Oral motor exercises;Compensatory techniques;Diet toleration management by SLP   Potential to Achieve Goals Good   Consulted and Agree with Plan of Care Patient        Problem List Patient Active Problem List   Diagnosis Date Noted  . Basal ganglia infarction 02/12/2015  . Dizziness   . Expressive aphasia   . Past pointing   . Right leg weakness   . Acute ischemic stroke 02/11/2015  . CVA (cerebral infarction) 02/11/2015  . Aspiration pneumonia 02/11/2015  . Aphasia   . Difficulty speaking   . Speech difficult to understand   . Stroke   . Encounter for antineoplastic chemotherapy 01/31/2015  . Constipation 12/20/2014  . Rash 12/20/2014  . Cellulitis 12/06/2014  . Peripheral edema 12/06/2014  . Gait disorder 09/05/2014  . Multiple myeloma   . Paraplegia 06/21/2014  . Postoperative anemia due to acute blood loss 06/21/2014  . 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 Spine metastasis 06/17/2014  . Thoracic spine tumor 06/17/2014  . Metastatic bone cancer 06/15/2014    Georgiann Hahn MS, [place]  03/15/2015, 2:58 PM  [facility] [address] [place], [place], [address] Phone: [phone]   Fax:  [phone]

## 2015-03-15 NOTE — Therapy (Signed)
[facility] [address] [place], [place], [address] Phone: [phone]   Fax:  [phone]  Occupational Therapy Treatment  Patient Details  Name: Jeremiah Owens MRN: [id number] Date of Birth: 09-24-1929 Referring Provider:  Wenda Low, MD  Encounter Date: 03/15/2015      OT End of Session - 03/15/15 1544    Visit Number 5   Number of Visits 9   Date for OT Re-Evaluation 03/29/15   Authorization Type MCR - G CODE NEEDED   Authorization - Visit Number 5   Authorization - Number of Visits 10   OT Start Time 5093   OT Stop Time 1615   OT Time Calculation (min) 42 min   Activity Tolerance Patient tolerated treatment well   Behavior During Therapy [facility] for tasks assessed/performed      Past Medical History  Diagnosis Date  . Compression fracture   . Allergy   . Anxiety   . Thyroid disease   . S/P radiation therapy 08/21/14-09/04/14    T4-6 25Gy/62f  . S/P radiation therapy 11/29/14-12/12/14    rt prox humerus/shoulder/scapula 25Gy/149f . Encounter for antineoplastic chemotherapy 01/31/2015  . Melanoma   . Bone cancer     t_spine T-5  . Prostate cancer 2002  . Skin cancer 1994    melanoma  right neck   . Multiple myeloma     Past Surgical History  Procedure Laterality Date  . Hernia repair    . Posterior cervical fusion/foraminotomy N/A 06/17/2014    Procedure: Thoracic five laminectomy for epidural tumor resection Thoracic 4-7 posterior lateral arthrodesis, segmental pedicle screw fixation.;  Surgeon: KyAshok PallMD;  [facility];  Service: Neurosurgery;  Laterality: N/A;    There were no vitals filed for this visit.  Visit Diagnosis:  Lack of coordination  Poor motor control of trunk  Unsteadiness  Activity intolerance  Abnormality of gait      Subjective Assessment - 03/15/15 1541    Subjective  Pt denies pain   Pertinent History Basal Ganglia stroke 02/10/15, active CA, LUE  shoulder impairments premorbidly approx. 75%    Limitations ** NO heat modalities b/c actively getting chemotherapy   Repetition Increases Symptoms   Patient Stated Goals get my arm and hand better   Currently in Pain? No/denies        Treatment: Self care:Tracing activities in prep for handwriting only occasional difficulty followed by tracing writing capital letters with large grip, increased time required yet good legibility of letters. Pt practiced writing his signature with 100% legibility after verbal cues for larger size. theraputic activities: Therapist reviewed coordination HEP with patient, min v.c. iniitally. Pt demonstrates difficulty with coordinating timing for tossing ball in right hand.. Marland Kitchene verbalized understanding and returned demonstration.                        OT Short Term Goals - 03/05/15 1054    OT SHORT TERM GOAL #1   Title NONE           OT Long Term Goals - 03/08/15 1425    OT LONG TERM GOAL #1   Title Independent w/ coordination HEP   Time 4   Period Weeks   Status Achieved   OT LONG TERM GOAL #2   Title Pt to perform dynamic standing tasks for 10 minutes or greater w/o LOB or rest break in prep for ADLS   Time 4   Period Weeks  Status New   OT LONG TERM GOAL #3   Title Pt to write check with 75% or greater legibility   Time 4   Period Weeks   Status On-going   OT LONG TERM GOAL #4   Title Improve coordination as evidenced by performing 9 hole peg test in 32 sec. or less Rt hand   Baseline 38.78 sec   Time 4   Period Weeks   Status New   OT LONG TERM GOAL #5   Title Pt to perform simple cooking activity safely mod I level for 15 min. or greater    Time 4   Period Weeks   Status New               Plan - 03/15/15 1542    Clinical Impression Statement Pt is progressing towards goals for handwriting/ coordination.   Pt will benefit from skilled therapeutic intervention in order to improve on the following  deficits (Retired) Decreased coordination;Decreased endurance;Decreased Surveyor, mining;Improper body mechanics;Decreased activity tolerance;Decreased knowledge of precautions;Decreased balance;Impaired UE functional use;Decreased strength;Decreased mobility   Rehab Potential Good   Clinical Impairments Affecting Rehab Potential active CA on chemo   OT Frequency 2x / week   OT Duration 4 weeks   Plan dynamic standing balance , coordination, strength, endurance   OT Home Exercise Plan coordination HEP issued 03/05/15.    Consulted and Agree with Plan of Care Patient        Problem List Patient Active Problem List   Diagnosis Date Noted  . Basal ganglia infarction 02/12/2015  . Dizziness   . Expressive aphasia   . Past pointing   . Right leg weakness   . Acute ischemic stroke 02/11/2015  . CVA (cerebral infarction) 02/11/2015  . Aspiration pneumonia 02/11/2015  . Aphasia   . Difficulty speaking   . Speech difficult to understand   . Stroke   . Encounter for antineoplastic chemotherapy 01/31/2015  . Constipation 12/20/2014  . Rash 12/20/2014  . Cellulitis 12/06/2014  . Peripheral edema 12/06/2014  . Gait disorder 09/05/2014  . Multiple myeloma   . Paraplegia 06/21/2014  . Postoperative anemia due to acute blood loss 06/21/2014  . 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 Spine metastasis 06/17/2014  . Thoracic spine tumor 06/17/2014  . Metastatic bone cancer 06/15/2014    RINE,KATHRYN 03/15/2015, 4:28 PM Theone Murdoch, OTR/L [phone] Phone: [phone] 4:28 PM 08/11/2016[facility] [address] [place], [place], [address] Phone: [phone]   Fax:  [phone]

## 2015-03-16 ENCOUNTER — Telehealth: Payer: Self-pay | Admitting: *Deleted

## 2015-03-16 NOTE — Telephone Encounter (Signed)
Per staff message and POF I have scheduled appts. Advised scheduler of appts. JMW  

## 2015-03-19 ENCOUNTER — Ambulatory Visit: Payer: Medicare Other | Admitting: Physical Therapy

## 2015-03-19 ENCOUNTER — Ambulatory Visit: Payer: Medicare Other

## 2015-03-19 ENCOUNTER — Ambulatory Visit: Payer: Medicare Other | Admitting: Occupational Therapy

## 2015-03-19 DIAGNOSIS — R6889 Other general symptoms and signs: Secondary | ICD-10-CM | POA: Diagnosis not present

## 2015-03-19 DIAGNOSIS — Z7409 Other reduced mobility: Secondary | ICD-10-CM

## 2015-03-19 DIAGNOSIS — R2681 Unsteadiness on feet: Secondary | ICD-10-CM

## 2015-03-19 DIAGNOSIS — R269 Unspecified abnormalities of gait and mobility: Secondary | ICD-10-CM

## 2015-03-19 DIAGNOSIS — R279 Unspecified lack of coordination: Secondary | ICD-10-CM

## 2015-03-19 DIAGNOSIS — I69359 Hemiplegia and hemiparesis following cerebral infarction affecting unspecified side: Secondary | ICD-10-CM

## 2015-03-19 DIAGNOSIS — R4701 Aphasia: Secondary | ICD-10-CM | POA: Diagnosis not present

## 2015-03-19 DIAGNOSIS — M6281 Muscle weakness (generalized): Secondary | ICD-10-CM

## 2015-03-19 DIAGNOSIS — R1313 Dysphagia, pharyngeal phase: Secondary | ICD-10-CM | POA: Diagnosis not present

## 2015-03-19 NOTE — Therapy (Signed)
[facility] [address] [place], [place], [address] Phone: [phone]   Fax:  [phone]  Speech Language Pathology Treatment  Patient Details  Name: Jeremiah Owens MRN: [id number] Date of Birth: Feb 20, 1930 Referring Provider:  Wenda Low, MD  Encounter Date: 03/19/2015      End of Session - 03/19/15 0828    Visit Number 4   Number of Visits 16   Date for SLP Re-Evaluation 04/25/15   SLP Start Time 0804   SLP Stop Time  0847   SLP Time Calculation (min) 43 min   Activity Tolerance Patient tolerated treatment well      Past Medical History  Diagnosis Date  . Compression fracture   . Allergy   . Anxiety   . Thyroid disease   . S/P radiation therapy 08/21/14-09/04/14    T4-6 25Gy/50f  . S/P radiation therapy 11/29/14-12/12/14    rt prox humerus/shoulder/scapula 25Gy/165f . Encounter for antineoplastic chemotherapy 01/31/2015  . Melanoma   . Bone cancer     t_spine T-5  . Prostate cancer 2002  . Skin cancer 1994    melanoma  right neck   . Multiple myeloma     Past Surgical History  Procedure Laterality Date  . Hernia repair    . Posterior cervical fusion/foraminotomy N/A 06/17/2014    Procedure: Thoracic five laminectomy for epidural tumor resection Thoracic 4-7 posterior lateral arthrodesis, segmental pedicle screw fixation.;  Surgeon: KyAshok PallMD;  [facility];  Service: Neurosurgery;  Laterality: N/A;    There were no vitals filed for this visit.  Visit Diagnosis: Expressive aphasia  Pharyngeal dysphagia      Subjective Assessment - 03/19/15 0807    Subjective "I brought my homework."               ADULT SLP TREATMENT - 03/19/15 0807    General Information   Behavior/Cognition Alert;Cooperative;Pleasant mood   Treatment Provided   Treatment provided Cognitive-Linquistic   Dysphagia Treatment   Temperature Spikes Noted No   Type of cueing Verbal;Visual   Amount of cueing Minimal   Other treatment/comments Occasional min cues needed for pt to complete swallowing HEP (tongue out past lips, hold breath for adduction, and max A with gargle (possibly due to oral non-verbal apraxia)    Pain Assessment   Pain Assessment No/denies pain   Cognitive-Linquistic Treatment   Treatment focused on Aphasia   Skilled Treatment SLP gathered sentence responses from pt to improve his language skills to premorbid levels. In problem solving pt req'd extra time and mod cues from SLP usually.Pt required cues to reduce rate occasionally.     Assessment / Recommendations / Plan   Plan Continue with current plan of care   Dysphagia Recommendations   Diet recommendations --  as directed on patient's last modified barium swallow          SLP Education - 03/19/15 0824    Education provided Yes   Education Details aphasia comensations   Person(s) Educated Patient   Methods Explanation;Demonstration;Verbal cues   Comprehension Verbalized understanding;Verbal cues required;Returned demonstration          SLP Short Term Goals - 03/19/15 0832    SLP SHORT TERM GOAL #1   Title pt will produce sentence responses in mod complex tasks 80% and rare min A   Time 3   Period Weeks   Status On-going   SLP SHORT TERM GOAL #2   Title pt will demo HEP  for swallowing with rare min A   Time 3   Period Weeks   Status On-going   SLP SHORT TERM GOAL #3   Title pt will demo compensatory strategies for anomia with occasasional min A   Time 3   Period Weeks   Status On-going          SLP Long Term Goals - 03/19/15 2111    SLP LONG TERM GOAL #1   Title pt will demo 10 minutes mod complex conversation fucntionally with modified indpendence   Time 7   Period Weeks   Status On-going   SLP LONG TERM GOAL #2   Title pt will demo HEP for swallowing with modified independence   Time 7   Period Weeks   Status On-going          Plan - 03/19/15 5520    Clinical  Impression Statement Pt required  occasional to usual min A for use of compensations for aphasia in sentences. Min A for dysphagia HEP. Word finding episodes persist frequently in conversation. Continue skilled ST to maximize verbal expressoin and safety of swallow        Problem List Patient Active Problem List   Diagnosis Date Noted  . Basal ganglia infarction 02/12/2015  . Dizziness   . Expressive aphasia   . Past pointing   . Right leg weakness   . Acute ischemic stroke 02/11/2015  . CVA (cerebral infarction) 02/11/2015  . Aspiration pneumonia 02/11/2015  . Aphasia   . Difficulty speaking   . Speech difficult to understand   . Stroke   . Encounter for antineoplastic chemotherapy 01/31/2015  . Constipation 12/20/2014  . Rash 12/20/2014  . Cellulitis 12/06/2014  . Peripheral edema 12/06/2014  . Gait disorder 09/05/2014  . Multiple myeloma   . Paraplegia 06/21/2014  . Postoperative anemia due to acute blood loss 06/21/2014  . 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 Spine metastasis 06/17/2014  . Thoracic spine tumor 06/17/2014  . Metastatic bone cancer 06/15/2014    [facility] , [place], [place]  03/19/2015, 8:51 AM  [facility] [address] [place], [place], [address] Phone: [phone]   Fax:  [phone]

## 2015-03-19 NOTE — Patient Instructions (Addendum)
   Add this to your swallow exercises:  Scrape the tip of your tongue all the way back on the roof of your mouth, 10 times, 2-3 times a day

## 2015-03-19 NOTE — Patient Instructions (Signed)
Leg Extension (Hamstring)   Sit toward front edge of chair, with leg out straight, heel on floor, toes pointing toward body. Hold for 60 seconds. Relax. Repeat on other leg.  Do this twice per day on each leg.  HIP / KNEE: Extension - Sit to Stand   Stand up from standard chair using only one hand.. Be sure your belly button is forward (as we discussed during PT session) before starting. Also, be sure you're leaning forward ("nose over knees") to stand. Do 10 stands, 3 times per day.   Weight Shift: Anterior / Posterior (Righting / Equilibrium)    BEGIN WITH BACK LEANING AGAINST THE WALL AND HEELS 4 INCHES AWAY. Move your hips off the wall and come to upright standing. Hold for 5 seconds. Return slowly to the wall letting your hips bump the wall and return to stand.  When sitting down, do so SLOWLY without using hands.  Repeat _10__ times per session. Do _3__ sessions per day.   Walking with high knees   Stand with one hand on your kitchen countertop. Walk the length of the countertop with knees high (as though you're marching). Make sure your ankle is also positioned such that your toes are pointing upward when you take a step. Walk the length of your countertop this way 6-8 times. Perform 3 times per day.

## 2015-03-19 NOTE — Therapy (Signed)
[facility] [address] [place], [place], [address] Phone: [phone]   Fax:  [phone]  Occupational Therapy Treatment  Patient Details  Name: Jeremiah Owens MRN: [id number] Date of Birth: [birth year] Referring Provider:  Wenda Low, MD  Encounter Date: 03/19/2015      OT End of Session - 03/19/15 0923    Visit Number 6   Number of Visits 9   Date for OT Re-Evaluation 03/29/15   Authorization Type MCR - G CODE NEEDED   Authorization Time Period week 3/4   Authorization - Visit Number 6   Authorization - Number of Visits 10   OT Start Time 0850   OT Stop Time 0930   OT Time Calculation (min) 40 min   Equipment Utilized During Treatment UBE   Activity Tolerance Patient tolerated treatment well      Past Medical History  Diagnosis Date  . Compression fracture   . Allergy   . Anxiety   . Thyroid disease   . S/P radiation therapy 08/21/14-09/04/14    T4-6 25Gy/61f  . S/P radiation therapy 11/29/14-12/12/14    rt prox humerus/shoulder/scapula 25Gy/167f . Encounter for antineoplastic chemotherapy 01/31/2015  . Melanoma   . Bone cancer     t_spine T-5  . Prostate cancer 2002  . Skin cancer 1994    melanoma  right neck   . Multiple myeloma     Past Surgical History  Procedure Laterality Date  . Hernia repair    . Posterior cervical fusion/foraminotomy N/A 06/17/2014    Procedure: Thoracic five laminectomy for epidural tumor resection Thoracic 4-7 posterior lateral arthrodesis, segmental pedicle screw fixation.;  Surgeon: KyAshok PallMD;  [facility];  Service: Neurosurgery;  Laterality: N/A;    There were no vitals filed for this visit.  Visit Diagnosis:  Lack of coordination  Activity intolerance  Decreased functional mobility and endurance  Generalized muscle weakness      Subjective Assessment - 03/19/15 0852    Subjective  Pt denies pain. "I've been cooking some"   Pertinent  History Basal Ganglia stroke 02/10/15, active CA, LUE shoulder impairments premorbidly approx. 75%    Limitations ** NO heat modalities b/c actively getting chemotherapy   Repetition Increases Symptoms   Patient Stated Goals get my arm and hand better   Currently in Pain? No/denies                      OT Treatments/Exercises (OP) - 03/19/15 0001    ADLs   Functional Mobility dynamic standing at countertop (1 hand support): reaching outside BOS bilateral sides and stepping backwards bilaterally with min cueing for big movement/ big stepping pattern x 15 min. with CGA for safety, no LOB   Shoulder Exercises: ROM/Strengthening   UBE (Upper Arm Bike) x 8 min. Level 3 for BUE for strength/endurance maintaining RPM b/t 30-40   Fine Motor Coordination   Fine Motor Coordination Small Pegboard   Small Pegboard Pt copying peg design with small pegs Rt hand: pt copied design with 100% accuracy, no cueing required. Pt required extra time for fine motor coordination                  OT Short Term Goals - 03/05/15 1054    OT SHORT TERM GOAL #1   Title NONE           OT Long Term Goals - 03/19/15 0920    OT LONG TERM GOAL #  1   Title Independent w/ coordination HEP (due by 03/28/15)   Time 4   Period Weeks   Status Achieved   OT LONG TERM GOAL #2   Title Pt to perform dynamic standing tasks for 10 minutes or greater w/o LOB or rest break in prep for ADLS   Time 4   Period Weeks   Status Achieved  with one hand countertop support   OT LONG TERM GOAL #3   Title Pt to write check with 75% or greater legibility   Time 4   Period Weeks   Status On-going   OT LONG TERM GOAL #4   Title Improve coordination as evidenced by performing 9 hole peg test in 32 sec. or less Rt hand   Baseline 38.78 sec   Time 4   Period Weeks   Status New   OT LONG TERM GOAL #5   Title Pt to perform simple cooking activity safely mod I level for 15 min. or greater    Time 4   Period  Weeks   Status Achieved  per pt report               Plan - 03/19/15 6067    Clinical Impression Statement Pt met LTG #2 with countertop support, and met LTG #5 per pt report. Pt progressing towards remaining goals   Plan practice handwriting/tracing, strength/endurance    OT Home Exercise Plan coordination HEP issued 03/05/15.    Consulted and Agree with Plan of Care Patient        Problem List Patient Active Problem List   Diagnosis Date Noted  . Basal ganglia infarction 02/12/2015  . Dizziness   . Expressive aphasia   . Past pointing   . Right leg weakness   . Acute ischemic stroke 02/11/2015  . CVA (cerebral infarction) 02/11/2015  . Aspiration pneumonia 02/11/2015  . Aphasia   . Difficulty speaking   . Speech difficult to understand   . Stroke   . Encounter for antineoplastic chemotherapy 01/31/2015  . Constipation 12/20/2014  . Rash 12/20/2014  . Cellulitis 12/06/2014  . Peripheral edema 12/06/2014  . Gait disorder 09/05/2014  . Multiple myeloma   . Paraplegia 06/21/2014  . Postoperative anemia due to acute blood loss 06/21/2014  . 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 Spine metastasis 06/17/2014  . Thoracic spine tumor 06/17/2014  . Metastatic bone cancer 06/15/2014    Carey Bullocks, OTR/L 03/19/2015, 9:32 AM  [facility] [address] [place], [place], [address] Phone: [phone]   Fax:  [phone]

## 2015-03-19 NOTE — Therapy (Signed)
[facility] [address] [place], [place], [address] Phone: [phone]   Fax:  [phone]  Physical Therapy Treatment  Patient Details  Name: Jeremiah Owens MRN: [id number] Date of Birth: [birth year] Referring Provider:  Wenda Low, MD  Encounter Date: 03/19/2015      PT End of Session - 03/19/15 1723    Visit Number 6   Number of Visits 17   Date for PT Re-Evaluation 04/27/15   Authorization Type KX;  Medicare [employer] - G codes required every 10 visits   PT Start Time 0935   PT Stop Time 1019   PT Time Calculation (min) 44 min   Equipment Utilized During Treatment Gait belt   Activity Tolerance Patient tolerated treatment well   Behavior During Therapy WFL for tasks assessed/performed      Past Medical History  Diagnosis Date  . Compression fracture   . Allergy   . Anxiety   . Thyroid disease   . S/P radiation therapy 08/21/14-09/04/14    T4-6 25Gy/90f  . S/P radiation therapy 11/29/14-12/12/14    rt prox humerus/shoulder/scapula 25Gy/188f . Encounter for antineoplastic chemotherapy 01/31/2015  . Melanoma   . Bone cancer     t_spine T-5  . Prostate cancer 2002  . Skin cancer 1994    melanoma  right neck   . Multiple myeloma     Past Surgical History  Procedure Laterality Date  . Hernia repair    . Posterior cervical fusion/foraminotomy N/A 06/17/2014    Procedure: Thoracic five laminectomy for epidural tumor resection Thoracic 4-7 posterior lateral arthrodesis, segmental pedicle screw fixation.;  Surgeon: KyAshok PallMD;  [facility];  Service: Neurosurgery;  Laterality: N/A;    There were no vitals filed for this visit.  Visit Diagnosis:  Abnormality of gait  Generalized muscle weakness  Hemiparesis due to recent cerebral infarction  Unsteadiness      Subjective Assessment - 03/19/15 0938    Subjective No falls and no changes.   Pertinent History Basal ganglia  infarct 02/10/15 with expressive aphasia; prostate cancer, skin cancer, multiple myeloma (active; chemotherapy on Wednesdays)   Limitations Walking   Patient Stated Goals "To be able to function without the walker (rollator); better balance."   Currently in Pain? No/denies                         [facility] Adult PT Treatment/Exercise - 03/19/15 1716    Bed Mobility   Supine to Sit 6: Modified independent (Device/Increase time)   Sit to Supine 6: Modified independent (Device/Increase time)   Transfers   Transfers Sit to Stand;Stand to Sit   Sit to Stand 6: Modified independent (Device/Increase time)   Sit to Stand Details (indicate cue type and reason) with single UE support   Stand to Sit 6: Modified independent (Device/Increase time)   Stand to Sit Details without UE support   Ambulation/Gait   Ambulation/Gait Yes   Ambulation/Gait Assistance 4: Min assist;6: Modified independent (Device/Increase time)   Ambulation/Gait Assistance Details Mod I with rollator; min A without AD   Ambulation Distance (Feet) 400 Feet   Assistive device None;Rollator   Gait Pattern Step-through pattern;Decreased dorsiflexion - right;Poor foot clearance - right;Trunk flexed;Decreased hip/knee flexion - right;Decreased weight shift to left;Poor foot clearance - left   Ambulation Surface Level;Indoor   Ramp 6: Modified independent (Device)  with rollator   Curb 6: Modified independent (Device/increase time)  using rollator  Gait Comments --   Standardized Balance Assessment   Standardized Balance Assessment Timed Up and Go Test   Timed Up and Go Test   TUG Normal TUG   Normal TUG (seconds) 16.95  using rollator   Neuro Re-ed    Neuro Re-ed Details  Performed the following home exercises: wall bumps x10 reps for increased postural stability/control; gait with high knees for increased LLE clearance during gait. See Pt Instructions for further detail on exercises, reps, sets performed. Using  Balance Master, pt performed dynamic training in standing without UE support. Focused on AP/PA weight shifting (to address pt tendency toward posterior LOB, arching back). Performed 2-minute trials x4 with 15-second allowance to reach target. Pt able to consistently reach target (distance/LOS: 30%) but had difficulty controlling/grading weight shift to remain on target.   Exercises   Other Exercises  Sit <> stand from EOM without UE support wit multimodal cueing for more neutral pelvic tilt, full anterior weight shift; required min A for sit > stand without UE support x8 reps total. Hamstring stretch 4 x30-second holds per side with subtle cueing for B knee extension.                  PT Short Term Goals - 03/19/15 0948    PT SHORT TERM GOAL #1   Title Pt will perform home exercises with mod I using paper handout to indicate safe HEP compliance. Target date: 03/26/15   Baseline Met 8/15.   Status Achieved   PT SHORT TERM GOAL #2   Title Perform Berg Balance Scale to assess stability with functional standing balance.  Target date: 03/26/15   Baseline 8/1: score 29/56   Status Achieved   PT SHORT TERM GOAL #3   Title Pt will decrease TUG time from 21.58 to 17.5 seconds or less to indicate improved efficiency of functional mobility.  Target date: 03/26/15   Baseline 8/15: 16.95 sec (average of 3 trials) with rollator   Status Achieved   PT SHORT TERM GOAL #4   Title Pt will negotiate standard curb step with mod I using LRAD to indicate increased safety/independence with community mobility.  Target date: 03/26/15   Baseline Met 8/15.   Status Achieved   PT SHORT TERM GOAL #5   Title Pt will ambulate at self-selected gait speed of 2.63 ft/sec to reach status of community ambulator. Target date: 03/26/15   Baseline 8/15: 2.65 ft/sec with rollator   Status Achieved   PT SHORT TERM GOAL #6   Title Pt will perform sit > stand from standard chair without UE support to indicate increased  functional LE strength.  Target date: 03/26/15   Baseline 8/15: Requires single UE assist for sit > stand.   Status Not Met   PT SHORT TERM GOAL #7   Title _________________________________________________________________________   Status Not Met   PT SHORT TERM GOAL #8   Title _________________________________________________________________   PT SHORT TERM GOAL #9   TITLE ____________________________________________________________________________________           PT Long Term Goals - 03/05/15 1735    PT LONG TERM GOAL #1   Title Pt/spouse will verbalize understanding of CVA warning signs to indicate understanding of situation in which to seek medical attention. Target date: 04/23/15.   Baseline ____________________________   Status On-going   PT LONG TERM GOAL #2   Title Pt will ambulate x200' over indoor, level surfaces with mod I, increased time without AD to indicate increased pt independence with  household mobility, progress toward pt-stated goal. Target date: 04/23/15.   Baseline ________________________________   Status On-going   PT LONG TERM GOAL #3   Title Pt will perform TUG test in < 13.5 sconds to indicate decreased risk of falling. Target date: 04/23/15.   Baseline 21.5 seconds with rollator   Status On-going   PT LONG TERM GOAL #4   Title Pt will increase Berg Balance Scale score by 6 points from baseline to indicate improvement in functional standing balance. Target date: 04/23/15.   Baseline 8/1: scored 29/56   Status On-going   PT LONG TERM GOAL #5   Title Pt will ambulate x500' over outdoor, unlevel surfaces with mod I using LRAD with no overt LOB to indicate increased safety with community mobility. Target date: 04/23/15.   Status On-going   PT LONG TERM GOAL #6   Title ______________________________________________________________________________   Baseline ___________________________________________               Plan - 03/19/15 1724    Clinical  Impression Statement Pt has met 5 of 6 STG's, suggesting improved efficiency of functional mobility, increased safety negotiating community obstacles, and safe compliance with HEP. Pt did not meet goal for sit > stand without UE support. Continue per POC.   Pt will benefit from skilled therapeutic intervention in order to improve on the following deficits Abnormal gait;Decreased balance;Difficulty walking;Decreased strength;Postural dysfunction;Decreased knowledge of use of DME;Decreased activity tolerance;Decreased endurance;Other (comment)  impaired proprioception; impaired postural stability/control   Rehab Potential Good   Clinical Impairments Affecting Rehab Potential Currently undergoing chemotherapy 1x/week   PT Frequency 2x / week   PT Duration 8 weeks   PT Treatment/Interventions ADLs/Self Care Home Management;Gait training;Therapeutic exercise;Patient/family education;Stair training;Balance training;Functional mobility training;Neuromuscular re-education;Manual techniques;Therapeutic activities;DME Instruction;Vestibular;Orthotic Fit/Training   PT Next Visit Plan Balance Master for COG control. Address hip strategy, impaired selective control of R ankle dorsiflexion with R knee flexion.   PT Home Exercise Plan See Pt Instructions from 8/4.   Consulted and Agree with Plan of Care Patient        Problem List Patient Active Problem List   Diagnosis Date Noted  . Basal ganglia infarction 02/12/2015  . Dizziness   . Expressive aphasia   . Past pointing   . Right leg weakness   . Acute ischemic stroke 02/11/2015  . CVA (cerebral infarction) 02/11/2015  . Aspiration pneumonia 02/11/2015  . Aphasia   . Difficulty speaking   . Speech difficult to understand   . Stroke   . Encounter for antineoplastic chemotherapy 01/31/2015  . Constipation 12/20/2014  . Rash 12/20/2014  . Cellulitis 12/06/2014  . Peripheral edema 12/06/2014  . Gait disorder 09/05/2014  . Multiple myeloma   .  Paraplegia 06/21/2014  . Postoperative anemia due to acute blood loss 06/21/2014  . 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 Spine metastasis 06/17/2014  . Thoracic spine tumor 06/17/2014  . Metastatic bone cancer 06/15/2014   Billie Ruddy, PT, DPT [facility] [address] [place], [place], [address] Phone: [phone]   Fax:  [phone] 03/19/2015, 5:35 PM

## 2015-03-20 ENCOUNTER — Telehealth: Payer: Self-pay | Admitting: Internal Medicine

## 2015-03-20 NOTE — Telephone Encounter (Signed)
S/w pt confirming labs/chemo on 08/17 and request to p/u calendar when he comes in... KJ

## 2015-03-21 ENCOUNTER — Ambulatory Visit (HOSPITAL_BASED_OUTPATIENT_CLINIC_OR_DEPARTMENT_OTHER): Payer: Medicare Other

## 2015-03-21 ENCOUNTER — Other Ambulatory Visit (HOSPITAL_BASED_OUTPATIENT_CLINIC_OR_DEPARTMENT_OTHER): Payer: Medicare Other

## 2015-03-21 ENCOUNTER — Other Ambulatory Visit: Payer: Self-pay

## 2015-03-21 ENCOUNTER — Other Ambulatory Visit: Payer: Self-pay | Admitting: Medical Oncology

## 2015-03-21 VITALS — BP 127/60 | HR 66 | Temp 97.2°F | Resp 18

## 2015-03-21 DIAGNOSIS — C9000 Multiple myeloma not having achieved remission: Secondary | ICD-10-CM

## 2015-03-21 DIAGNOSIS — Z5112 Encounter for antineoplastic immunotherapy: Secondary | ICD-10-CM | POA: Diagnosis present

## 2015-03-21 LAB — BASIC METABOLIC PANEL (CC13)
ANION GAP: 9 meq/L (ref 3–11)
BUN: 17.2 mg/dL (ref 7.0–26.0)
CALCIUM: 8.7 mg/dL (ref 8.4–10.4)
CO2: 29 mEq/L (ref 22–29)
Chloride: 100 mEq/L (ref 98–109)
Creatinine: 0.9 mg/dL (ref 0.7–1.3)
EGFR: 75 mL/min/{1.73_m2} — AB (ref >90)
Glucose: 87 mg/dl (ref 70–140)
Potassium: 4 mEq/L (ref 3.5–5.1)
SODIUM: 138 meq/L (ref 136–145)

## 2015-03-21 LAB — CBC WITH DIFFERENTIAL/PLATELET
BASO%: 0.5 % (ref 0.0–2.0)
Basophils Absolute: 0 10*3/uL (ref 0.0–0.1)
EOS%: 11.4 % — AB (ref 0.0–7.0)
Eosinophils Absolute: 0.4 10*3/uL (ref 0.0–0.5)
HEMATOCRIT: 38.2 % — AB (ref 38.4–49.9)
HGB: 12.7 g/dL — ABNORMAL LOW (ref 13.0–17.1)
LYMPH#: 0.5 10*3/uL — AB (ref 0.9–3.3)
LYMPH%: 14.6 % (ref 14.0–49.0)
MCH: 32.1 pg (ref 27.2–33.4)
MCHC: 33.3 g/dL (ref 32.0–36.0)
MCV: 96.4 fL (ref 79.3–98.0)
MONO#: 0.3 10*3/uL (ref 0.1–0.9)
MONO%: 8.2 % (ref 0.0–14.0)
NEUT#: 2.1 10*3/uL (ref 1.5–6.5)
NEUT%: 65.3 % (ref 39.0–75.0)
Platelets: 65 10*3/uL — ABNORMAL LOW (ref 140–400)
RBC: 3.96 10*6/uL — AB (ref 4.20–5.82)
RDW: 17.8 % — ABNORMAL HIGH (ref 11.0–14.6)
WBC: 3.2 10*3/uL — AB (ref 4.0–10.3)

## 2015-03-21 MED ORDER — ONDANSETRON HCL 8 MG PO TABS
ORAL_TABLET | ORAL | Status: AC
  Filled 2015-03-21: qty 1

## 2015-03-21 MED ORDER — BORTEZOMIB CHEMO SQ INJECTION 3.5 MG (2.5MG/ML)
1.3000 mg/m2 | Freq: Once | INTRAMUSCULAR | Status: AC
  Administered 2015-03-21: 2.5 mg via SUBCUTANEOUS
  Filled 2015-03-21: qty 2.5

## 2015-03-21 MED ORDER — ONDANSETRON 8 MG PO TBDP
8.0000 mg | ORAL_TABLET | Freq: Once | ORAL | Status: AC
Start: 2015-03-21 — End: 2015-03-21
  Administered 2015-03-21: 8 mg via ORAL
  Filled 2015-03-21: qty 1

## 2015-03-21 MED ORDER — ZOLEDRONIC ACID 4 MG/100ML IV SOLN
4.0000 mg | Freq: Once | INTRAVENOUS | Status: AC
  Administered 2015-03-21: 4 mg via INTRAVENOUS
  Filled 2015-03-21: qty 100

## 2015-03-21 NOTE — Patient Instructions (Signed)
[facility] Discharge Instructions for Patients Receiving Chemotherapy  Today you received the following chemotherapy agents Velcade  To help prevent nausea and vomiting after your treatment, we encourage you to take your nausea medication as prescribed.   If you develop nausea and vomiting that is not controlled by your nausea medication, call the clinic.   BELOW ARE SYMPTOMS THAT SHOULD BE REPORTED IMMEDIATELY:  *FEVER GREATER THAN 100.5 F  *CHILLS WITH OR WITHOUT FEVER  NAUSEA AND VOMITING THAT IS NOT CONTROLLED WITH YOUR NAUSEA MEDICATION  *UNUSUAL SHORTNESS OF BREATH  *UNUSUAL BRUISING OR BLEEDING  TENDERNESS IN MOUTH AND THROAT WITH OR WITHOUT PRESENCE OF ULCERS  *URINARY PROBLEMS  *BOWEL PROBLEMS  UNUSUAL RASH Items with * indicate a potential emergency and should be followed up as soon as possible.  Feel free to call the clinic you have any questions or concerns. The clinic phone number is [phone].  Please show the [place] at check-in to the Emergency Department and triage nurse.   Zoledronic Acid injection (Hypercalcemia, Oncology) (Zometa) What is this medicine? ZOLEDRONIC ACID (ZOE le dron ik AS id) lowers the amount of calcium loss from bone. It is used to treat too much calcium in your blood from cancer. It is also used to prevent complications of cancer that has spread to the bone. This medicine may be used for other purposes; ask your health care provider or pharmacist if you have questions. COMMON BRAND NAME(S): Zometa What should I tell my health care provider before I take this medicine? They need to know if you have any of these conditions: -aspirin-sensitive asthma -cancer, especially if you are receiving medicines used to treat cancer -dental disease or wear dentures -infection -kidney disease -receiving corticosteroids like dexamethasone or prednisone -an unusual or allergic reaction to zoledronic acid, other  medicines, foods, dyes, or preservatives -pregnant or trying to get pregnant -breast-feeding How should I use this medicine? This medicine is for infusion into a vein. It is given by a health care professional in a hospital or clinic setting. Talk to your pediatrician regarding the use of this medicine in children. Special care may be needed. Overdosage: If you think you have taken too much of this medicine contact a poison control center or emergency room at once. NOTE: This medicine is only for you. Do not share this medicine with others. What if I miss a dose? It is important not to miss your dose. Call your doctor or health care professional if you are unable to keep an appointment. What may interact with this medicine? -certain antibiotics given by injection -NSAIDs, medicines for pain and inflammation, like ibuprofen or naproxen -some diuretics like bumetanide, furosemide -teriparatide -thalidomide This list may not describe all possible interactions. Give your health care provider a list of all the medicines, herbs, non-prescription drugs, or dietary supplements you use. Also tell them if you smoke, drink alcohol, or use illegal drugs. Some items may interact with your medicine. What should I watch for while using this medicine? Visit your doctor or health care professional for regular checkups. It may be some time before you see the benefit from this medicine. Do not stop taking your medicine unless your doctor tells you to. Your doctor may order blood tests or other tests to see how you are doing. Women should inform their doctor if they wish to become pregnant or think they might be pregnant. There is a potential for serious side effects to an unborn child. Talk  to your health care professional or pharmacist for more information. You should make sure that you get enough calcium and vitamin D while you are taking this medicine. Discuss the foods you eat and the vitamins you take with  your health care professional. Some people who take this medicine have severe bone, joint, and/or muscle pain. This medicine may also increase your risk for jaw problems or a broken thigh bone. Tell your doctor right away if you have severe pain in your jaw, bones, joints, or muscles. Tell your doctor if you have any pain that does not go away or that gets worse. Tell your dentist and dental surgeon that you are taking this medicine. You should not have major dental surgery while on this medicine. See your dentist to have a dental exam and fix any dental problems before starting this medicine. Take good care of your teeth while on this medicine. Make sure you see your dentist for regular follow-up appointments. What side effects may I notice from receiving this medicine? Side effects that you should report to your doctor or health care professional as soon as possible: -allergic reactions like skin rash, itching or hives, swelling of the face, lips, or tongue -anxiety, confusion, or depression -breathing problems -changes in vision -eye pain -feeling faint or lightheaded, falls -jaw pain, especially after dental work -mouth sores -muscle cramps, stiffness, or weakness -trouble passing urine or change in the amount of urine Side effects that usually do not require medical attention (report to your doctor or health care professional if they continue or are bothersome): -bone, joint, or muscle pain -constipation -diarrhea -fever -hair loss -irritation at site where injected -loss of appetite -nausea, vomiting -stomach upset -trouble sleeping -trouble swallowing -weak or tired This list may not describe all possible side effects. Call your doctor for medical advice about side effects. You may report side effects to FDA at 1-800-FDA-1088. Where should I keep my medicine? This drug is given in a hospital or clinic and will not be stored at home. NOTE: This sheet is a summary. It may not  cover all possible information. If you have questions about this medicine, talk to your doctor, pharmacist, or health care provider.  2015, Elsevier/Gold Standard. (2012-12-30 13:03:13)

## 2015-03-21 NOTE — Progress Notes (Signed)
Ok to treat pt today with chemo  based on CMET from 03/14/15 and cbc from today

## 2015-03-22 ENCOUNTER — Other Ambulatory Visit: Payer: Self-pay | Admitting: Medical Oncology

## 2015-03-22 ENCOUNTER — Ambulatory Visit: Payer: Medicare Other | Admitting: Physical Therapy

## 2015-03-22 ENCOUNTER — Other Ambulatory Visit: Payer: Self-pay | Admitting: *Deleted

## 2015-03-22 ENCOUNTER — Ambulatory Visit: Payer: Medicare Other | Admitting: Occupational Therapy

## 2015-03-22 DIAGNOSIS — R1313 Dysphagia, pharyngeal phase: Secondary | ICD-10-CM | POA: Diagnosis not present

## 2015-03-22 DIAGNOSIS — R279 Unspecified lack of coordination: Secondary | ICD-10-CM

## 2015-03-22 DIAGNOSIS — R6889 Other general symptoms and signs: Secondary | ICD-10-CM | POA: Diagnosis not present

## 2015-03-22 DIAGNOSIS — M6281 Muscle weakness (generalized): Secondary | ICD-10-CM

## 2015-03-22 DIAGNOSIS — C9000 Multiple myeloma not having achieved remission: Secondary | ICD-10-CM

## 2015-03-22 DIAGNOSIS — Z7409 Other reduced mobility: Secondary | ICD-10-CM

## 2015-03-22 DIAGNOSIS — R269 Unspecified abnormalities of gait and mobility: Secondary | ICD-10-CM | POA: Diagnosis not present

## 2015-03-22 DIAGNOSIS — R4701 Aphasia: Secondary | ICD-10-CM | POA: Diagnosis not present

## 2015-03-22 DIAGNOSIS — R29818 Other symptoms and signs involving the nervous system: Secondary | ICD-10-CM

## 2015-03-22 DIAGNOSIS — R2991 Unspecified symptoms and signs involving the musculoskeletal system: Secondary | ICD-10-CM

## 2015-03-22 MED ORDER — LENALIDOMIDE 25 MG PO CAPS: 25.0000 mg | ORAL_CAPSULE | Freq: Every day | ORAL | Status: DC

## 2015-03-22 MED ORDER — WARFARIN SODIUM 2 MG PO TABS: 2.0000 mg | ORAL_TABLET | Freq: Every day | ORAL | Status: DC

## 2015-03-22 NOTE — Therapy (Signed)
[facility] [address] [place], [place], [address] Phone: [phone]   Fax:  [phone]  Occupational Therapy Treatment  Patient Details  Name: Jeremiah Owens MRN: [id number] Date of Birth: 18-May-1930 Referring Provider:  Wenda Low, MD  Encounter Date: 03/22/2015      OT End of Session - 03/22/15 1629    Visit Number 7   Number of Visits 9   Date for OT Re-Evaluation 03/29/15   Authorization Type MCR - G CODE NEEDED   Authorization Time Period week 3/4   Authorization - Visit Number 7   Authorization - Number of Visits 10   OT Start Time 7619   OT Stop Time 1615   OT Time Calculation (min) 45 min   Activity Tolerance Patient tolerated treatment well      Past Medical History  Diagnosis Date  . Compression fracture   . Allergy   . Anxiety   . Thyroid disease   . S/P radiation therapy 08/21/14-09/04/14    T4-6 25Gy/33f  . S/P radiation therapy 11/29/14-12/12/14    rt prox humerus/shoulder/scapula 25Gy/127f . Encounter for antineoplastic chemotherapy 01/31/2015  . Melanoma   . Bone cancer     t_spine T-5  . Prostate cancer 2002  . Skin cancer 1994    melanoma  right neck   . Multiple myeloma     Past Surgical History  Procedure Laterality Date  . Hernia repair    . Posterior cervical fusion/foraminotomy N/A 06/17/2014    Procedure: Thoracic five laminectomy for epidural tumor resection Thoracic 4-7 posterior lateral arthrodesis, segmental pedicle screw fixation.;  Surgeon: KyAshok PallMD;  [facility];  Service: Neurosurgery;  Laterality: N/A;    There were no vitals filed for this visit.  Visit Diagnosis:  Generalized muscle weakness  Lack of coordination  Poor motor control of trunk  Decreased functional mobility and endurance      Subjective Assessment - 03/22/15 1528    Pertinent History Basal Ganglia stroke 02/10/15, active CA, LUE shoulder impairments premorbidly  approx. 75%    Limitations ** NO heat modalities b/c actively getting chemotherapy   Repetition Increases Symptoms   Patient Stated Goals get my arm and hand better   Currently in Pain? No/denies            [facility] - 03/22/15 0001    Coordination   Right 9 Hole Peg Test 28.97 sec                  OT Treatments/Exercises (OP) - 03/22/15 0001    ADLs   Writing Pt practiced writing 3 checks in print (signing name) with 75-90% legibility. Pt with decreased legibility due to expressive aphasia and difficulty formulating letters. Pt required extra time   Neurological Re-education Exercises   Trunk Exercises Core Activation   Trunk Core Activation While engaging anterior pelvic tilt with trunk/thoracic extension. Recommended pt do this exercise t/o day to improve posture, reduce pressure on back and improve balance in standing. Pt also shown towel stretch in supine to increase thoracic extension                  OT Short Term Goals - 03/05/15 1054    OT SHORT TERM GOAL #1   Title NONE           OT Long Term Goals - 03/22/15 1541    OT LONG TERM GOAL #1   Title Independent w/ coordination HEP (  due by 03/28/15)   Time 4   Period Weeks   Status Achieved   OT LONG TERM GOAL #2   Title Pt to perform dynamic standing tasks for 10 minutes or greater w/o LOB or rest break in prep for ADLS   Time 4   Period Weeks   Status Achieved  with one hand countertop support   OT LONG TERM GOAL #3   Title Pt to write check with 75% or greater legibility   Time 4   Period Weeks   Status On-going   OT LONG TERM GOAL #4   Title Improve coordination as evidenced by performing 9 hole peg test in 32 sec. or less Rt hand   Baseline 38.78 sec   Time 4   Period Weeks   Status Achieved  03/22/15: Rt = 28.97 sec.    OT LONG TERM GOAL #5   Title Pt to perform simple cooking activity safely mod I level for 15 min. or greater    Time 4   Period Weeks   Status  Achieved  per pt report               Plan - 03/22/15 1630    Clinical Impression Statement Pt met LTG #4. Pt progressing with coordination   Plan postural control, UBE (d/c planned next week, G-code required)   OT Home Exercise Plan coordination HEP issued 03/05/15.    Consulted and Agree with Plan of Care Patient        Problem List Patient Active Problem List   Diagnosis Date Noted  . Basal ganglia infarction 02/12/2015  . Dizziness   . Expressive aphasia   . Past pointing   . Right leg weakness   . Acute ischemic stroke 02/11/2015  . CVA (cerebral infarction) 02/11/2015  . Aspiration pneumonia 02/11/2015  . Aphasia   . Difficulty speaking   . Speech difficult to understand   . Stroke   . Encounter for antineoplastic chemotherapy 01/31/2015  . Constipation 12/20/2014  . Rash 12/20/2014  . Cellulitis 12/06/2014  . Peripheral edema 12/06/2014  . Gait disorder 09/05/2014  . Multiple myeloma   . Paraplegia 06/21/2014  . Postoperative anemia due to acute blood loss 06/21/2014  . 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 Spine metastasis 06/17/2014  . Thoracic spine tumor 06/17/2014  . Metastatic bone cancer 06/15/2014    Carey Bullocks, OTR/L 03/22/2015, 4:32 PM  [facility] [address] [place], [place], [address] Phone: [phone]   Fax:  [phone]

## 2015-03-22 NOTE — Progress Notes (Signed)
[facility]

## 2015-03-23 ENCOUNTER — Ambulatory Visit: Payer: Medicare Other

## 2015-03-23 DIAGNOSIS — R269 Unspecified abnormalities of gait and mobility: Secondary | ICD-10-CM | POA: Diagnosis not present

## 2015-03-23 DIAGNOSIS — R279 Unspecified lack of coordination: Secondary | ICD-10-CM | POA: Diagnosis not present

## 2015-03-23 DIAGNOSIS — R4701 Aphasia: Secondary | ICD-10-CM

## 2015-03-23 DIAGNOSIS — R1313 Dysphagia, pharyngeal phase: Secondary | ICD-10-CM

## 2015-03-23 DIAGNOSIS — M6281 Muscle weakness (generalized): Secondary | ICD-10-CM | POA: Diagnosis not present

## 2015-03-23 DIAGNOSIS — R6889 Other general symptoms and signs: Secondary | ICD-10-CM | POA: Diagnosis not present

## 2015-03-23 NOTE — Therapy (Signed)
[facility] [address] [place], [place], [address] Phone: [phone]   Fax:  [phone]  Speech Language Pathology Treatment  Patient Details  Name: Jeremiah Owens MRN: [id number] Date of Birth: November 20, 1929 Referring Provider:  Wenda Low, MD  Encounter Date: 03/23/2015      End of Session - 03/23/15 1236    Visit Number 5   Number of Visits 16   Date for SLP Re-Evaluation 04/25/15   SLP Start Time 1150   SLP Stop Time  1232   SLP Time Calculation (min) 42 min   Activity Tolerance Patient tolerated treatment well      Past Medical History  Diagnosis Date  . Compression fracture   . Allergy   . Anxiety   . Thyroid disease   . S/P radiation therapy 08/21/14-09/04/14    T4-6 25Gy/32f  . S/P radiation therapy 11/29/14-12/12/14    rt prox humerus/shoulder/scapula 25Gy/145f . Encounter for antineoplastic chemotherapy 01/31/2015  . Melanoma   . Bone cancer     t_spine T-5  . Prostate cancer 2002  . Skin cancer 1994    melanoma  right neck   . Multiple myeloma     Past Surgical History  Procedure Laterality Date  . Hernia repair    . Posterior cervical fusion/foraminotomy N/A 06/17/2014    Procedure: Thoracic five laminectomy for epidural tumor resection Thoracic 4-7 posterior lateral arthrodesis, segmental pedicle screw fixation.;  Surgeon: KyAshok PallMD;  [facility];  Service: Neurosurgery;  Laterality: N/A;    There were no vitals filed for this visit.  Visit Diagnosis: Pharyngeal dysphagia  Expressive aphasia      Subjective Assessment - 03/23/15 1150    Subjective "I -ah- did the -homework there."               ADULT SLP TREATMENT - 03/23/15 1151    General Information   Behavior/Cognition Alert;Cooperative;Pleasant mood   Treatment Provided   Treatment provided Cognitive-Linquistic   Pain Assessment   Pain Assessment No/denies pain   Cognitive-Linquistic  Treatment   Treatment focused on Aphasia   Skilled Treatment Pt told SLP he does not always turn his head to rt during meals. Pt added he would like to put goal for writing, checks especially, on his plan of care.Pt began writing his address for SLP to assess correctness. Street name and zip code incorrect, so pt began with copying his name and address with consistent min-mod A needed from SLP. Pt encouraged to cont to practice this at home. SLP facilitated pt improved spoken language tasks with word/phrase responses in picture description with consistent min-mod cues from SLP. Pt using rare synonym/rephrasing as compensations.   Assessment / Recommendations / Plan   Plan Continue with current plan of care   Dysphagia Recommendations   Diet recommendations --  as on pt's last modified barium swallow   Progression Toward Goals   Progression toward goals Progressing toward goals          SLP Education - 03/23/15 1236    Education provided Yes   Education Details compensations for aphasia/apraxia   Person(s) Educated Patient   Methods Explanation;Demonstration;Verbal cues   Comprehension Verbalized understanding;Verbal cues required;Need further instruction          SLP Short Term Goals - 03/23/15 1154    SLP SHORT TERM GOAL #1   Title pt will produce sentence responses in mod complex tasks 80% and rare min A   Time 3  Period Weeks   Status On-going   SLP SHORT TERM GOAL #2   Title pt will demo HEP for swallowing with rare min A   Time 3   Period Weeks   Status On-going   SLP SHORT TERM GOAL #3   Title pt will demo compensatory strategies for anomia with occasasional min A   Time 3   Period Weeks   Status On-going   SLP SHORT TERM GOAL #4   Title Pt's signature/ basic demographic info will not show any signs of aphasia   Time 3   Period Weeks   Status New          SLP Long Term Goals - 03/23/15 1153    SLP LONG TERM GOAL #1   Title pt will demo 10 minutes mod  complex conversation fucntionally with modified indpendence   Time 7   Period Weeks   Status On-going   SLP LONG TERM GOAL #2   Title pt will demo HEP for swallowing with modified independence   Time 7   Period Weeks   Status On-going   SLP LONG TERM GOAL #3   Title pt will write checks adequately with rare min cues   Time 7   Period Weeks   Status New          Plan - 03/23/15 1236    Clinical Impression Statement Pt with minimally frequent use of compensations. Skilled ST needs to cont to maximize pt verbal and written expression - pt would like to work on check writing and SLP added goal accordingly.   Speech Therapy Frequency 2x / week   Duration --  7 weeks   Treatment/Interventions Internal/external aids;Compensatory strategies;SLP instruction and feedback;Patient/family education;Functional tasks;Cueing hierarchy;Pharyngeal strengthening exercises;Oral motor exercises;Compensatory techniques;Diet toleration management by SLP   Potential to Achieve Goals Good        Problem List Patient Active Problem List   Diagnosis Date Noted  . Basal ganglia infarction 02/12/2015  . Dizziness   . Expressive aphasia   . Past pointing   . Right leg weakness   . Acute ischemic stroke 02/11/2015  . CVA (cerebral infarction) 02/11/2015  . Aspiration pneumonia 02/11/2015  . Aphasia   . Difficulty speaking   . Speech difficult to understand   . Stroke   . Encounter for antineoplastic chemotherapy 01/31/2015  . Constipation 12/20/2014  . Rash 12/20/2014  . Cellulitis 12/06/2014  . Peripheral edema 12/06/2014  . Gait disorder 09/05/2014  . Multiple myeloma   . Paraplegia 06/21/2014  . Postoperative anemia due to acute blood loss 06/21/2014  . 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 Spine metastasis 06/17/2014  . Thoracic spine tumor 06/17/2014  . Metastatic bone cancer 06/15/2014    [facility] , [place], [place]  03/23/2015, 12:38 PM  [facility] [address] [place], [place], [address] Phone: [phone]   Fax:  [phone]

## 2015-03-23 NOTE — Patient Instructions (Signed)
  Please complete the assigned speech therapy homework and return it to your next session.  

## 2015-03-26 ENCOUNTER — Other Ambulatory Visit: Payer: Self-pay | Admitting: *Deleted

## 2015-03-26 MED ORDER — METHOCARBAMOL 500 MG PO TABS: ORAL_TABLET | ORAL | Status: DC

## 2015-03-26 NOTE — Telephone Encounter (Signed)
Dr Naaman Plummer approved [facility]

## 2015-03-27 ENCOUNTER — Ambulatory Visit: Payer: Medicare Other | Admitting: Occupational Therapy

## 2015-03-27 ENCOUNTER — Ambulatory Visit: Payer: Medicare Other

## 2015-03-27 ENCOUNTER — Ambulatory Visit: Payer: Medicare Other | Admitting: Physical Therapy

## 2015-03-27 ENCOUNTER — Encounter: Payer: Self-pay | Admitting: Occupational Therapy

## 2015-03-27 DIAGNOSIS — R269 Unspecified abnormalities of gait and mobility: Secondary | ICD-10-CM | POA: Diagnosis not present

## 2015-03-27 DIAGNOSIS — R6889 Other general symptoms and signs: Secondary | ICD-10-CM | POA: Diagnosis not present

## 2015-03-27 DIAGNOSIS — R4701 Aphasia: Secondary | ICD-10-CM

## 2015-03-27 DIAGNOSIS — R1313 Dysphagia, pharyngeal phase: Secondary | ICD-10-CM | POA: Diagnosis not present

## 2015-03-27 DIAGNOSIS — R279 Unspecified lack of coordination: Secondary | ICD-10-CM | POA: Diagnosis not present

## 2015-03-27 DIAGNOSIS — M6281 Muscle weakness (generalized): Secondary | ICD-10-CM | POA: Diagnosis not present

## 2015-03-27 DIAGNOSIS — I69359 Hemiplegia and hemiparesis following cerebral infarction affecting unspecified side: Secondary | ICD-10-CM

## 2015-03-27 DIAGNOSIS — R2681 Unsteadiness on feet: Secondary | ICD-10-CM

## 2015-03-27 NOTE — Therapy (Signed)
[facility] [address] [place], [place], [address] Phone: [phone]   Fax:  [phone]  Speech Language Pathology Treatment  Patient Details  Name: Jeremiah Owens MRN: [id number] Date of Birth: Oct 31, 1929 Referring Provider:  Wenda Low, MD  Encounter Date: 03/27/2015      End of Session - 03/27/15 0902    Visit Number 6   Number of Visits 16   Date for SLP Re-Evaluation 04/25/15   SLP Start Time 0803   SLP Stop Time  0847   SLP Time Calculation (min) 44 min   Activity Tolerance Patient tolerated treatment well      Past Medical History  Diagnosis Date  . Compression fracture   . Allergy   . Anxiety   . Thyroid disease   . S/P radiation therapy 08/21/14-09/04/14    T4-6 25Gy/54f  . S/P radiation therapy 11/29/14-12/12/14    rt prox humerus/shoulder/scapula 25Gy/162f . Encounter for antineoplastic chemotherapy 01/31/2015  . Melanoma   . Bone cancer     t_spine T-5  . Prostate cancer 2002  . Skin cancer 1994    melanoma  right neck   . Multiple myeloma     Past Surgical History  Procedure Laterality Date  . Hernia repair    . Posterior cervical fusion/foraminotomy N/A 06/17/2014    Procedure: Thoracic five laminectomy for epidural tumor resection Thoracic 4-7 posterior lateral arthrodesis, segmental pedicle screw fixation.;  Surgeon: KyAshok PallMD;  [facility];  Service: Neurosurgery;  Laterality: N/A;    There were no vitals filed for this visit.  Visit Diagnosis: Pharyngeal dysphagia  Expressive aphasia      Subjective Assessment - 03/27/15 0809    Subjective Pt completed all homework as directed.               ADULT SLP TREATMENT - 03/27/15 0813    General Information   Behavior/Cognition Alert;Cooperative;Pleasant mood   Treatment Provided   Treatment provided Cognitive-Linquistic   Dysphagia Treatment   Temperature Spikes Noted No   Liquids provided via  Cup   Pharyngeal Phase Signs & Symptoms Wet vocal quality  intermittent wetness throughout session   Type of cueing Verbal;Visual   Amount of cueing Minimal   Other treatment/comments Pt completed HEP with occasional min A (tongue out for [place] and base of tongue back for gargle).    Pain Assessment   Pain Assessment No/denies pain   Cognitive-Linquistic Treatment   Treatment focused on Aphasia   Skilled Treatment In conversation re: tree falling on pt's driveway, pt with extra time but functional communication. SLP worked with pt to improve his written language by having him fill in blanks to words of 6-9 letters. He req'd min A rarely for emergent awareness. He then read 4-6 word sentences, covered them, and rewrote them. Rare min-mod A needed for correct spelling. Pt with concern re: legibility/small letters and SLP encouraged pt to consult with OT.   Assessment / Recommendations / Plan   Plan Continue with current plan of care   Dysphagia Recommendations   Diet recommendations --  as directed on pt's modified barium swallow exam   Progression Toward Goals   Progression toward goals Progressing toward goals          SLP Education - 03/27/15 0857    Education provided Yes   Education Details HEP for dysphagia   Person(s) Educated Patient   Methods Explanation;Verbal cues;Demonstration   Comprehension Verbalized understanding;Returned demonstration;Verbal cues required;Need  further instruction          SLP Short Term Goals - 03/27/15 0909    SLP SHORT TERM GOAL #1   Title pt will produce sentence responses in mod complex tasks 80% and rare min A   Time 2   Period Weeks   Status On-going   SLP SHORT TERM GOAL #2   Title pt will demo HEP for swallowing with rare min A   Time 2   Period Weeks   Status On-going   SLP SHORT TERM GOAL #3   Title pt will demo compensatory strategies for anomia with occasasional min A   Time 2   Period Weeks   Status On-going   SLP SHORT  TERM GOAL #4   Title Pt's signature/ basic demographic info will not show any signs of aphasia   Time 2   Period Weeks   Status New          SLP Long Term Goals - 03/27/15 0909    SLP LONG TERM GOAL #1   Title pt will demo 10 minutes mod complex conversation fucntionally with modified indpendence   Time 6   Period Weeks   Status On-going   SLP LONG TERM GOAL #2   Title pt will demo HEP for swallowing with modified independence   Time 6   Period Weeks   Status On-going   SLP LONG TERM GOAL #3   Title pt will write checks adequately with rare min cues   Time 6   Period Weeks   Status On-going          Plan - 03/27/15 7092    Clinical Impression Statement Pt cont to require cues for HEP procedure. PT's conversational speech has improved since last week. Skilled ST to cont for dysphagia HEP/swallow safety as well as for expressive langauge.   Speech Therapy Frequency 2x / week   Duration --  6 weeks   Treatment/Interventions Internal/external aids;Compensatory strategies;SLP instruction and feedback;Patient/family education;Functional tasks;Cueing hierarchy;Pharyngeal strengthening exercises;Oral motor exercises;Compensatory techniques;Diet toleration management by SLP   Potential to Achieve Goals Good        Problem List Patient Active Problem List   Diagnosis Date Noted  . Basal ganglia infarction 02/12/2015  . Dizziness   . Expressive aphasia   . Past pointing   . Right leg weakness   . Acute ischemic stroke 02/11/2015  . CVA (cerebral infarction) 02/11/2015  . Aspiration pneumonia 02/11/2015  . Aphasia   . Difficulty speaking   . Speech difficult to understand   . Stroke   . Encounter for antineoplastic chemotherapy 01/31/2015  . Constipation 12/20/2014  . Rash 12/20/2014  . Cellulitis 12/06/2014  . Peripheral edema 12/06/2014  . Gait disorder 09/05/2014  . Multiple myeloma   . Paraplegia 06/21/2014  . Postoperative anemia due to acute blood loss  06/21/2014  . 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 Spine metastasis 06/17/2014  . Thoracic spine tumor 06/17/2014  . Metastatic bone cancer 06/15/2014    [facility] , MS, CCC-SLP  03/27/2015, 9:10 AM  [facility] [address] [place], [place], [address] Phone: [phone]   Fax:  [phone]
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
## 2015-03-27 NOTE — Therapy (Signed)
[facility] [address] [place], [place], [address] Phone: [phone]   Fax:  [phone]  Occupational Therapy Treatment  Patient Details  Name: Jeremiah Owens MRN: [id number] Date of Birth: 1929-09-09 Referring Provider:  Wenda Low, MD  Encounter Date: 03/27/2015      OT End of Session - 03/27/15 1243    Visit Number 8   Number of Visits 9   Date for OT Re-Evaluation 03/29/15   Authorization Type MCR - G CODE NEEDED   OT Start Time 0932   OT Stop Time 1010   OT Time Calculation (min) 38 min   Activity Tolerance Patient tolerated treatment well      Past Medical History  Diagnosis Date  . Compression fracture   . Allergy   . Anxiety   . Thyroid disease   . S/P radiation therapy 08/21/14-09/04/14    T4-6 25Gy/72f  . S/P radiation therapy 11/29/14-12/12/14    rt prox humerus/shoulder/scapula 25Gy/113f . Encounter for antineoplastic chemotherapy 01/31/2015  . Melanoma   . Bone cancer     t_spine T-5  . Prostate cancer 2002  . Skin cancer 1994    melanoma  right neck   . Multiple myeloma     Past Surgical History  Procedure Laterality Date  . Hernia repair    . Posterior cervical fusion/foraminotomy N/A 06/17/2014    Procedure: Thoracic five laminectomy for epidural tumor resection Thoracic 4-7 posterior lateral arthrodesis, segmental pedicle screw fixation.;  Surgeon: KyAshok PallMD;  [facility];  Service: Neurosurgery;  Laterality: N/A;    There were no vitals filed for this visit.  Visit Diagnosis:  Lack of coordination  Hemiparesis due to recent cerebral infarction      Subjective Assessment - 03/27/15 0937    Subjective  Overall I think I feel pretty good.   Pertinent History Basal Ganglia stroke 02/10/15, active CA, LUE shoulder impairments premorbidly approx. 75%    Limitations ** NO heat modalities b/c actively getting chemotherapy   Repetition Increases Symptoms   Patient Stated Goals get my arm and hand better   Currently in Pain? No/denies                      OT Treatments/Exercises (OP) - 03/27/15 1237    ADLs   Writing Practiced writing today to verfify goal achievement. Pt able to write checks with 100% legibility. Pt continues with some spelling errors due to language deficits but was independently able to self correct. Pt with normal speed for task today as well.   Exercises   Exercises Hand   Hand Exercises   In Hand Manipulation Training Utlized therapeutic activities to address in hand manipulation with small objects - pt with minimal difficulty. Also discussed and provided in writing instructions for how to increase challenge of HEP in light of d/c today from OT services.  Pt verbalized understanding.                OT Education - 03/27/15 1241    Education provided Yes   Education Details how to upgrade HEP for RUE    Person(s) Educated Patient   Methods Explanation;Handout   Comprehension Verbalized understanding          OT Short Term Goals - 03/05/15 1054    OT SHORT TERM GOAL #1   Title NONE           OT Long Term Goals - 03/27/15 1241  OT LONG TERM GOAL #1   Title Independent w/ coordination HEP (due by 03/28/15)   Time 4   Period Weeks   Status Achieved   OT LONG TERM GOAL #2   Title Pt to perform dynamic standing tasks for 10 minutes or greater w/o LOB or rest break in prep for ADLS   Time 4   Period Weeks   Status Achieved  with one hand countertop support   OT LONG TERM GOAL #3   Title Pt to write check with 75% or greater legibility   Time 4   Period Weeks   Status Achieved   OT LONG TERM GOAL #4   Title Improve coordination as evidenced by performing 9 hole peg test in 32 sec. or less Rt hand   Baseline 38.78 sec   Time 4   Period Weeks   Status Achieved  03/22/15: Rt = 28.97 sec.    OT LONG TERM GOAL #5   Title Pt to perform simple cooking activity safely mod I level for  15 min. or greater    Time 4   Period Weeks   Status Achieved  per pt report               Plan - April 26, 2015 1241    Clinical Impression Statement Pt has met 5/5 LTG's and is ready for discharge today. Pt is aware of how to upgrade challenge of HEP on his own. Pt states his writing is "so much better I can't believe it."   Pt will benefit from skilled therapeutic intervention in order to improve on the following deficits (Retired) Decreased coordination;Decreased endurance;Decreased safety awareness;Improper body mechanics;Decreased activity tolerance;Decreased knowledge of precautions;Decreased balance;Impaired UE functional use;Decreased strength;Decreased mobility   Rehab Potential Good   Clinical Impairments Affecting Rehab Potential active CA on chemo   OT Frequency 2x / week   OT Duration 4 weeks   OT Treatment/Interventions Self-care/ADL training;DME and/or AE instruction;Patient/family education;Therapeutic exercises;Therapeutic activities;Functional Mobility Training;Neuromuscular education;Passive range of motion;Manual Therapy;Cognitive remediation/compensation   Plan d/c from OT today. Pt in agreement   OT Home Exercise Plan coordination HEP issued 03/05/15.    Consulted and Agree with Plan of Care Patient          G-Codes - 04/26/2015 10-31-42    Functional Limitation Carrying, moving and handling objects   Carrying, Moving and Handling Objects Current Status [phone]) At least 1 percent but less than 20 percent impaired, limited or restricted   Carrying, Moving and Handling Objects Goal Status ([id number]) At least 1 percent but less than 20 percent impaired, limited or restricted   Carrying, Moving and Handling Objects Discharge Status [phone]) At least 1 percent but less than 20 percent impaired, limited or restricted      Problem List Patient Active Problem List   Diagnosis Date Noted  . Basal ganglia infarction 02/12/2015  . Dizziness   . Expressive aphasia   . Past  pointing   . Right leg weakness   . Acute ischemic stroke 02/11/2015  . CVA (cerebral infarction) 02/11/2015  . Aspiration pneumonia 02/11/2015  . Aphasia   . Difficulty speaking   . Speech difficult to understand   . Stroke   . Encounter for antineoplastic chemotherapy 01/31/2015  . Constipation 12/20/2014  . Rash 12/20/2014  . Cellulitis 12/06/2014  . Peripheral edema 12/06/2014  . Gait disorder 09/05/2014  . Multiple myeloma   . Paraplegia 06/21/2014  . Postoperative anemia due to acute blood loss 06/21/2014  . Neoplasm of  thoracic spine 06/20/2014  . Spine metastasis 06/17/2014  . Thoracic spine tumor 06/17/2014  . Metastatic bone cancer 06/15/2014   OCCUPATIONAL THERAPY DISCHARGE SUMMARY  Visits from Start of Care: 8  Current functional level related to goals / functional outcomes: See above goals   Remaining deficits: Mild lack of coordination   Education / Equipment: HEP Plan: Patient agrees to discharge.  Patient goals were met. Patient is being discharged due to meeting the stated rehab goals.  ?????      Quay Burow , OTR/L  03/27/2015, 12:48 PM  [facility] [address] [place], [place], [address] Phone: [phone]   Fax:  [phone]

## 2015-03-27 NOTE — Patient Instructions (Addendum)
Ways to upgrade your home exercise program for your right hand as you get stronger!  1.  Increase the amount of time you are doing the program for a single session (for example if you are doing 15 minutes increase this to 20 minutes) 2. Increase the number of times you do a session within a week (for example if you do it once a day try twice a day for a few days in the week) 3. Increase your speed of the activity making sure you maintain your accuracy 4. Spend more time on the activities that are more challenging and less time on the easier tasks.

## 2015-03-28 ENCOUNTER — Ambulatory Visit (HOSPITAL_BASED_OUTPATIENT_CLINIC_OR_DEPARTMENT_OTHER): Payer: Medicare Other | Admitting: Internal Medicine

## 2015-03-28 ENCOUNTER — Ambulatory Visit (HOSPITAL_BASED_OUTPATIENT_CLINIC_OR_DEPARTMENT_OTHER): Payer: Medicare Other

## 2015-03-28 ENCOUNTER — Other Ambulatory Visit (HOSPITAL_BASED_OUTPATIENT_CLINIC_OR_DEPARTMENT_OTHER): Payer: Medicare Other

## 2015-03-28 ENCOUNTER — Encounter: Payer: Self-pay | Admitting: Internal Medicine

## 2015-03-28 ENCOUNTER — Telehealth: Payer: Self-pay | Admitting: Internal Medicine

## 2015-03-28 VITALS — BP 130/55 | HR 72 | Temp 98.3°F | Resp 17 | Ht 70.0 in | Wt 166.0 lb

## 2015-03-28 DIAGNOSIS — Z5112 Encounter for antineoplastic immunotherapy: Secondary | ICD-10-CM | POA: Diagnosis present

## 2015-03-28 DIAGNOSIS — C9000 Multiple myeloma not having achieved remission: Secondary | ICD-10-CM

## 2015-03-28 DIAGNOSIS — D492 Neoplasm of unspecified behavior of bone, soft tissue, and skin: Secondary | ICD-10-CM

## 2015-03-28 LAB — CBC WITH DIFFERENTIAL/PLATELET
BASO%: 0.4 % (ref 0.0–2.0)
Basophils Absolute: 0 10*3/uL (ref 0.0–0.1)
EOS%: 15.5 % — AB (ref 0.0–7.0)
Eosinophils Absolute: 0.4 10*3/uL (ref 0.0–0.5)
HEMATOCRIT: 35.2 % — AB (ref 38.4–49.9)
HEMOGLOBIN: 11.8 g/dL — AB (ref 13.0–17.1)
LYMPH#: 0.3 10*3/uL — AB (ref 0.9–3.3)
LYMPH%: 13.4 % — ABNORMAL LOW (ref 14.0–49.0)
MCH: 32.2 pg (ref 27.2–33.4)
MCHC: 33.5 g/dL (ref 32.0–36.0)
MCV: 95.9 fL (ref 79.3–98.0)
MONO#: 0.2 10*3/uL (ref 0.1–0.9)
MONO%: 7.1 % (ref 0.0–14.0)
NEUT#: 1.5 10*3/uL (ref 1.5–6.5)
NEUT%: 63.6 % (ref 39.0–75.0)
Platelets: 69 10*3/uL — ABNORMAL LOW (ref 140–400)
RBC: 3.67 10*6/uL — ABNORMAL LOW (ref 4.20–5.82)
RDW: 16.4 % — AB (ref 11.0–14.6)
WBC: 2.4 10*3/uL — AB (ref 4.0–10.3)

## 2015-03-28 LAB — COMPREHENSIVE METABOLIC PANEL (CC13)
ALT: 20 U/L (ref 0–55)
AST: 16 U/L (ref 5–34)
Albumin: 3.6 g/dL (ref 3.5–5.0)
Alkaline Phosphatase: 60 U/L (ref 40–150)
Anion Gap: 8 mEq/L (ref 3–11)
BUN: 19.8 mg/dL (ref 7.0–26.0)
CALCIUM: 8.9 mg/dL (ref 8.4–10.4)
CHLORIDE: 101 meq/L (ref 98–109)
CO2: 30 mEq/L — ABNORMAL HIGH (ref 22–29)
CREATININE: 1 mg/dL (ref 0.7–1.3)
EGFR: 70 mL/min/{1.73_m2} — ABNORMAL LOW (ref >90)
Glucose: 86 mg/dl (ref 70–140)
Potassium: 4.2 mEq/L (ref 3.5–5.1)
Sodium: 139 mEq/L (ref 136–145)
Total Bilirubin: 0.82 mg/dL (ref 0.20–1.20)
Total Protein: 5.5 g/dL — ABNORMAL LOW (ref 6.4–8.3)

## 2015-03-28 MED ORDER — BORTEZOMIB CHEMO SQ INJECTION 3.5 MG (2.5MG/ML)
1.3000 mg/m2 | Freq: Once | INTRAMUSCULAR | Status: AC
  Administered 2015-03-28: 2.5 mg via SUBCUTANEOUS
  Filled 2015-03-28: qty 2.5

## 2015-03-28 MED ORDER — ONDANSETRON 8 MG PO TBDP
8.0000 mg | ORAL_TABLET | Freq: Once | ORAL | Status: AC
  Administered 2015-03-28: 8 mg via ORAL
  Filled 2015-03-28: qty 1

## 2015-03-28 NOTE — Progress Notes (Signed)
    [facility] [phone]   [phone]  OFFICE PROGRESS NOTE  HUSAIN,KARRAR, MD [address] [place] [address]  DIAGNOSIS: Multiple myeloma diagnosed in November 2015  PRIOR THERAPY:  1) Status post Thoracic five laminectomy for epidural tumor resection, Thoracic 4-7 posterior lateral arthrodesis,  segmental pedicle screw fixation T4-T7 Globus instrumentation under the care of Dr. Cabbell on 06/18/2014. 2) Systemic chemotherapy with Velcade 1.3 MG/M2 subcutaneously weekly in addition to Decadron 40 mg by mouth on weekly basis. Status post 11 cycles. 3) palliative radiotherapy to the right proximal humerus, shoulder and scapula under the care of Dr. Moody completed on 12/12/2014.  CURRENT THERAPY:  1) Systemic chemotherapy with Velcade 1.3 MG/M2 subcutaneously weekly, Decadron 40 mg by mouth weekly in addition to Revlimid 25 MG by mouth daily for 21 days every 4 weeks, status post 6 cycle. 2) Zometa 4 mg IV every month. First dose 11/01/2014.  INTERVAL HISTORY: Jeremiah Owens 81 y.o. male returns to the clinic today for follow-up visit accompanied by his wife. The patient is currently on treatment with weekly subcutaneous Velcade, Revlimid and Decadron status post 6 cycle and tolerating the treatment fairly well. He denied having any significant nausea or vomiting, no fever or chills. He denied having any significant weight loss or night sweats. He denied having any significant chest pain, shortness of breath, cough or hemoptysis. He has occasional back pain. The patient is here today for evaluation before starting cycle #7.   MEDICAL HISTORY: Past Medical History  Diagnosis Date  . Compression fracture   . Allergy   . Anxiety   . Thyroid disease   . S/P radiation therapy 08/21/14-09/04/14    T4-6 25Gy/10fx  . S/P radiation therapy 11/29/14-12/12/14    rt prox humerus/shoulder/scapula 25Gy/10fx  . Encounter for antineoplastic  chemotherapy 01/31/2015  . Melanoma   . Bone cancer     t_spine T-5  . Prostate cancer 2002  . Skin cancer 1994    melanoma  right neck   . Multiple myeloma     ALLERGIES:  is allergic to iodine.  MEDICATIONS:  Current Outpatient Prescriptions  Medication Sig Dispense Refill  . acyclovir (ZOVIRAX) 400 MG tablet Take 1 tablet (400 mg total) by mouth 2 (two) times daily. 60 tablet 2  . aspirin EC 325 MG tablet Take 1 tablet (325 mg total) by mouth daily.    . Calcium Carbonate-Vitamin D (CALTRATE 600+D PO) Take 600 mg by mouth 2 (two) times daily.    . cholecalciferol (VITAMIN D) 1000 UNITS tablet Take 1,000 Units by mouth at bedtime.     . dexamethasone (DECADRON) 4 MG tablet 10 TAB EVERY WEEK, START WITH CHEMO 40 tablet 4  . furosemide (LASIX) 20 MG tablet Take 20 mg by mouth 2 (two) times daily.    . KLOR-CON 10 10 MEQ tablet Take 10 mEq by mouth daily.    . lenalidomide (REVLIMID) 25 MG capsule Take 1 capsule (25 mg total) by mouth daily. Take one capsule ( 25 mg) by mouth for 21 days every 4 weeks-03/22/15 Authorization number=  [id number] adult male 21 capsule 0  . levothyroxine (SYNTHROID, LEVOTHROID) 25 MCG tablet Take 1 tablet (25 mcg total) by mouth daily. 30 tablet 4  . methocarbamol (ROBAXIN) 500 MG tablet TAKE 1 TABLET (500 MG TOTAL) BY MOUTH EVERY 6 (SIX) HOURS AS NEEDED FOR MUSCLE SPASMS. 180 tablet 4  . Multiple Vitamins-Minerals (CENTRUM SILVER PO) Take 1 tablet by   mouth daily.    Marland Kitchen omeprazole (PRILOSEC) 40 MG capsule Take 40 mg by mouth daily.    . ranitidine (ZANTAC) 150 MG tablet Take 150 mg by mouth daily as needed for heartburn.   11  . sennosides-docusate sodium (SENOKOT-S) 8.6-50 MG tablet Take 1-2 tablets by mouth 2 (two) times daily as needed for constipation. Takes 1-2 tabs daily    . tamsulosin (FLOMAX) 0.4 MG CAPS capsule Take 1 capsule (0.4 mg total) by mouth at bedtime. 30 capsule 3  . warfarin (COUMADIN) 2 MG tablet Take 1 tablet (2 mg total) by mouth daily. 90  tablet 4  . HYDROcodone-acetaminophen (NORCO) 5-325 MG per tablet Take 1 tablet by mouth every 6 (six) hours as needed for moderate pain. (Patient not taking: Reported on 03/28/2015) 60 tablet 0  . prochlorperazine (COMPAZINE) 10 MG tablet Take 10 mg by mouth daily.  0   No current facility-administered medications for this visit.    SURGICAL HISTORY:  Past Surgical History  Procedure Laterality Date  . Hernia repair    . Posterior cervical fusion/foraminotomy N/A 06/17/2014    Procedure: Thoracic five laminectomy for epidural tumor resection Thoracic 4-7 posterior lateral arthrodesis, segmental pedicle screw fixation.;  Surgeon: Ashok Pall, MD;  Location: [facility];  Service: Neurosurgery;  Laterality: N/A;    REVIEW OF SYSTEMS:  Constitutional: positive for fatigue Eyes: negative Ears, nose, mouth, throat, and face: negative Respiratory: negative Cardiovascular: negative Gastrointestinal: negative Genitourinary:negative Integument/breast: negative Hematologic/lymphatic: negative Musculoskeletal:positive for bone pain and muscle weakness Neurological: negative Behavioral/Psych: negative Endocrine: negative Allergic/Immunologic: negative   PHYSICAL EXAMINATION: General appearance: alert, cooperative, fatigued and no distress Head: Normocephalic, without obvious abnormality, atraumatic Neck: no adenopathy, no JVD, supple, symmetrical, trachea midline and thyroid not enlarged, symmetric, no tenderness/mass/nodules Lymph nodes: Cervical, supraclavicular, and axillary nodes normal. Resp: clear to auscultation bilaterally Back: symmetric, no curvature. ROM normal. No CVA tenderness. Cardio: regular rate and rhythm, S1, S2 normal, no murmur, click, rub or gallop GI: soft, non-tender; bowel sounds normal; no masses,  no organomegaly Extremities: edema 2+ edema bilaterally with erythema on the legs Neurologic: Alert and oriented X 3, normal strength and tone. Normal symmetric  reflexes. Normal coordination and gait Motor: grossly normal Weakness in the left foot  ECOG PERFORMANCE STATUS: 1 - Symptomatic but completely ambulatory  Blood pressure 130/55, pulse 72, temperature 98.3 F (36.8 C), temperature source Oral, resp. rate 17, height 5' 10" (1.778 m), weight 166 lb (75.297 kg), SpO2 99 %.  LABORATORY DATA: Lab Results  Component Value Date   WBC 2.4* 03/28/2015   HGB 11.8* 03/28/2015   HCT 35.2* 03/28/2015   MCV 95.9 03/28/2015   PLT 69* 03/28/2015      Chemistry      Component Value Date/Time   NA 139 03/28/2015 0956   NA 134* 02/21/2015 0626   K 4.2 03/28/2015 0956   K 4.1 02/21/2015 0626   CL 101 02/21/2015 0626   CO2 30* 03/28/2015 0956   CO2 26 02/21/2015 0626   BUN 19.8 03/28/2015 0956   BUN 12 02/21/2015 0626   CREATININE 1.0 03/28/2015 0956   CREATININE 0.84 02/21/2015 0626      Component Value Date/Time   CALCIUM 8.9 03/28/2015 0956   CALCIUM 7.9* 02/21/2015 0626   ALKPHOS 60 03/28/2015 0956   ALKPHOS 48 02/21/2015 0626   AST 16 03/28/2015 0956   AST 17 02/21/2015 0626   ALT 20 03/28/2015 0956   ALT 25 02/21/2015 0626   BILITOT  0.82 03/28/2015 0956   BILITOT 1.1 02/21/2015 0626       RADIOGRAPHIC STUDIES: No results found.  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 He completed treatment with subcutaneous Velcade and Decadron status post 11 weekly doses. This was discontinued secondary to disease progression. The patient was started on treatment with weekly subcutaneous Velcade, Revlimid for 21 days every 4 weeks in addition to Decadron 40 mg weekly status post 6 cycle and he is tolerating it fairly well.  I recommended for the patient to continue his treatment today as a scheduled. He would come back for follow-up visit in one month for reevaluation  after repeating myeloma panel for evaluation of his disease. He was advised to call immediately if he has any concerning symptoms in the interval. The patient voices understanding of current disease status and treatment options and is in agreement with the current care plan.  All questions were answered. The patient knows to call the clinic with any problems, questions or concerns. We can certainly see the patient much sooner if necessary.  Disclaimer: This note was dictated with voice recognition software. Similar sounding words can inadvertently be transcribed and may not be corrected upon review.

## 2015-03-28 NOTE — Telephone Encounter (Signed)
Gave adn printed appt sched and avs for pt for Aug adn SEpt

## 2015-03-28 NOTE — Patient Instructions (Signed)
Cancer Center Discharge Instructions for Patients Receiving Chemotherapy  Today you received the following chemotherapy agents: Velcade.  To help prevent nausea and vomiting after your treatment, we encourage you to take your nausea medication: Compazine 10 mg every 6 hours as needed.   If you develop nausea and vomiting that is not controlled by your nausea medication, call the clinic.   BELOW ARE SYMPTOMS THAT SHOULD BE REPORTED IMMEDIATELY:  *FEVER GREATER THAN 100.5 F  *CHILLS WITH OR WITHOUT FEVER  NAUSEA AND VOMITING THAT IS NOT CONTROLLED WITH YOUR NAUSEA MEDICATION  *UNUSUAL SHORTNESS OF BREATH  *UNUSUAL BRUISING OR BLEEDING  TENDERNESS IN MOUTH AND THROAT WITH OR WITHOUT PRESENCE OF ULCERS  *URINARY PROBLEMS  *BOWEL PROBLEMS  UNUSUAL RASH Items with * indicate a potential emergency and should be followed up as soon as possible.  Feel free to call the clinic you have any questions or concerns. The clinic phone number is [phone].  Please show the CHEMO ALERT CARD at check-in to the Emergency Department and triage nurse.   

## 2015-03-29 ENCOUNTER — Ambulatory Visit: Payer: Medicare Other | Admitting: Physical Therapy

## 2015-03-29 ENCOUNTER — Ambulatory Visit: Payer: Medicare Other

## 2015-03-29 ENCOUNTER — Ambulatory Visit: Payer: Medicare Other | Admitting: Occupational Therapy

## 2015-03-29 DIAGNOSIS — I6340 Cerebral infarction due to embolism of unspecified cerebral artery: Secondary | ICD-10-CM | POA: Diagnosis not present

## 2015-03-29 DIAGNOSIS — R1313 Dysphagia, pharyngeal phase: Secondary | ICD-10-CM | POA: Diagnosis not present

## 2015-03-29 DIAGNOSIS — D696 Thrombocytopenia, unspecified: Secondary | ICD-10-CM | POA: Diagnosis not present

## 2015-03-29 DIAGNOSIS — R4701 Aphasia: Secondary | ICD-10-CM

## 2015-03-29 DIAGNOSIS — R269 Unspecified abnormalities of gait and mobility: Secondary | ICD-10-CM | POA: Diagnosis not present

## 2015-03-29 DIAGNOSIS — E039 Hypothyroidism, unspecified: Secondary | ICD-10-CM | POA: Diagnosis not present

## 2015-03-29 DIAGNOSIS — K219 Gastro-esophageal reflux disease without esophagitis: Secondary | ICD-10-CM | POA: Diagnosis not present

## 2015-03-29 DIAGNOSIS — T148 Other injury of unspecified body region: Secondary | ICD-10-CM | POA: Diagnosis not present

## 2015-03-29 DIAGNOSIS — C9000 Multiple myeloma not having achieved remission: Secondary | ICD-10-CM | POA: Diagnosis not present

## 2015-03-29 DIAGNOSIS — I69359 Hemiplegia and hemiparesis following cerebral infarction affecting unspecified side: Secondary | ICD-10-CM

## 2015-03-29 DIAGNOSIS — M6281 Muscle weakness (generalized): Secondary | ICD-10-CM | POA: Diagnosis not present

## 2015-03-29 DIAGNOSIS — R6889 Other general symptoms and signs: Secondary | ICD-10-CM | POA: Diagnosis not present

## 2015-03-29 DIAGNOSIS — R2681 Unsteadiness on feet: Secondary | ICD-10-CM

## 2015-03-29 DIAGNOSIS — R279 Unspecified lack of coordination: Secondary | ICD-10-CM | POA: Diagnosis not present

## 2015-03-29 DIAGNOSIS — R1310 Dysphagia, unspecified: Secondary | ICD-10-CM | POA: Diagnosis not present

## 2015-03-29 DIAGNOSIS — C61 Malignant neoplasm of prostate: Secondary | ICD-10-CM | POA: Diagnosis not present

## 2015-03-29 NOTE — Therapy (Signed)
[facility] [address] [place], [place], [address] Phone: [phone]   Fax:  [phone]  Speech Language Pathology Treatment  Patient Details  Name: Jeremiah Owens MRN: [id number] Date of Birth: 08/19/1929 Referring Provider:  Wenda Low, MD  Encounter Date: 03/29/2015      End of Session - 03/29/15 0845    Visit Number 7   Number of Visits 16   Date for SLP Re-Evaluation 04/25/15   SLP Start Time 0802   SLP Stop Time  0846   SLP Time Calculation (min) 44 min   Activity Tolerance Patient tolerated treatment well      Past Medical History  Diagnosis Date  . Compression fracture   . Allergy   . Anxiety   . Thyroid disease   . S/P radiation therapy 08/21/14-09/04/14    T4-6 25Gy/37f  . S/P radiation therapy 11/29/14-12/12/14    rt prox humerus/shoulder/scapula 25Gy/146f . Encounter for antineoplastic chemotherapy 01/31/2015  . Melanoma   . Bone cancer     t_spine T-5  . Prostate cancer 2002  . Skin cancer 1994    melanoma  right neck   . Multiple myeloma     Past Surgical History  Procedure Laterality Date  . Hernia repair    . Posterior cervical fusion/foraminotomy N/A 06/17/2014    Procedure: Thoracic five laminectomy for epidural tumor resection Thoracic 4-7 posterior lateral arthrodesis, segmental pedicle screw fixation.;  Surgeon: KyAshok PallMD;  [facility];  Service: Neurosurgery;  Laterality: N/A;    There were no vitals filed for this visit.  Visit Diagnosis: Expressive aphasia  Pharyngeal dysphagia      Subjective Assessment - 03/29/15 0809    Subjective Pt completed all homework as directed.               ADULT SLP TREATMENT - 03/29/15 0809    General Information   Behavior/Cognition Alert;Cooperative;Pleasant mood   Treatment Provided   Treatment provided Cognitive-Linquistic   Dysphagia Treatment   Temperature Spikes Noted No   Liquids provided via  Cup   Other treatment/comments Pt reports not throat clearing or coughing approx 5-10% of swallows, due to fast rate of speech.    Pain Assessment   Pain Assessment No/denies pain   Cognitive-Linquistic Treatment   Treatment focused on Apraxia   Skilled Treatment In spoken language tasks pt described pictures in detail functionally with rare min questioning cues by SLP (mod complex language). In written tasks pt req'd mod verbal and written cues from SLP occasionally in sentence fill-ins.    Assessment / Recommendations / Plan   Plan Continue with current plan of care   Dysphagia Recommendations   Diet recommendations --  as directed on last modified barium swallow exam   Progression Toward Goals   Progression toward goals Progressing toward goals            SLP Short Term Goals - 03/29/15 0847    SLP SHORT TERM GOAL #1   Title pt will produce sentence responses in mod complex tasks 80% and rare min A   Time 2   Period Weeks   Status On-going   SLP SHORT TERM GOAL #2   Title pt will demo HEP for swallowing with rare min A   Time 2   Period Weeks   Status On-going   SLP SHORT TERM GOAL #3   Title pt will demo compensatory strategies for anomia with occasasional min A   Time  2   Period Weeks   Status On-going   SLP SHORT TERM GOAL #4   Title Pt's signature/ basic demographic info will not show any signs of aphasia   Time 2   Period Weeks   Status New          SLP Long Term Goals - 03/27/15 0909    SLP LONG TERM GOAL #1   Title pt will demo 10 minutes mod complex conversation fucntionally with modified indpendence   Time 6   Period Weeks   Status On-going   SLP LONG TERM GOAL #2   Title pt will demo HEP for swallowing with modified independence   Time 6   Period Weeks   Status On-going   SLP LONG TERM GOAL #3   Title pt will write checks adequately with rare min cues   Time 6   Period Weeks   Status On-going          Plan - 03/29/15 0846    Clinical  Impression Statement Pt's conversational speech has improved since last week. Skilled ST to cont for dysphagia HEP/swallow safety as well as for expressive langauge.   Speech Therapy Frequency 2x / week   Duration --  6 weeks   Treatment/Interventions Internal/external aids;Compensatory strategies;SLP instruction and feedback;Patient/family education;Functional tasks;Cueing hierarchy;Pharyngeal strengthening exercises;Oral motor exercises;Compensatory techniques;Diet toleration management by SLP   Potential to Achieve Goals Good        Problem List Patient Active Problem List   Diagnosis Date Noted  . Basal ganglia infarction 02/12/2015  . Dizziness   . Expressive aphasia   . Past pointing   . Right leg weakness   . Acute ischemic stroke 02/11/2015  . CVA (cerebral infarction) 02/11/2015  . Aspiration pneumonia 02/11/2015  . Aphasia   . Difficulty speaking   . Speech difficult to understand   . Stroke   . Encounter for antineoplastic chemotherapy 01/31/2015  . Constipation 12/20/2014  . Rash 12/20/2014  . Cellulitis 12/06/2014  . Peripheral edema 12/06/2014  . Gait disorder 09/05/2014  . Multiple myeloma   . Paraplegia 06/21/2014  . Postoperative anemia due to acute blood loss 06/21/2014  . 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 Spine metastasis 06/17/2014  . Thoracic spine tumor 06/17/2014  . Metastatic bone cancer 06/15/2014    [facility] , MS, CCC-SLP  03/29/2015, 8:48 AM  [facility] [address] [place], [place], [address] Phone: [phone]   Fax:  [phone]

## 2015-03-29 NOTE — Therapy (Signed)
[facility] [address] [place] [address] [place], [place], [address] Phone: [phone]   Fax:  [phone]  Physical Therapy Treatment  Patient Details  Name: Jeremiah Owens MRN: [id number] Date of Birth: 1929/10/21 Referring Provider:  Wenda Low, MD  Encounter Date: 03/29/2015      PT End of Session - 03/29/15 2042    Visit Number 8   Number of Visits 17   Date for PT Re-Evaluation 04/27/15   Authorization Type KX;  Medicare [employer] - G codes required every 10 visits   PT Start Time 0935   PT Stop Time 1015   PT Time Calculation (min) 40 min   Equipment Utilized During Treatment Gait belt   Activity Tolerance Patient tolerated treatment well;Patient limited by fatigue  multiple rest breaks   Behavior During Therapy WFL for tasks assessed/performed      Past Medical History  Diagnosis Date  . Compression fracture   . Allergy   . Anxiety   . Thyroid disease   . S/P radiation therapy 08/21/14-09/04/14    T4-6 25Gy/9f  . S/P radiation therapy 11/29/14-12/12/14    rt prox humerus/shoulder/scapula 25Gy/161f . Encounter for antineoplastic chemotherapy 01/31/2015  . Melanoma   . Bone cancer     t_spine T-5  . Prostate cancer 2002  . Skin cancer 1994    melanoma  right neck   . Multiple myeloma     Past Surgical History  Procedure Laterality Date  . Hernia repair    . Posterior cervical fusion/foraminotomy N/A 06/17/2014    Procedure: Thoracic five laminectomy for epidural tumor resection Thoracic 4-7 posterior lateral arthrodesis, segmental pedicle screw fixation.;  Surgeon: KyAshok PallMD;  [facility];  Service: Neurosurgery;  Laterality: N/A;    There were no vitals filed for this visit.  Visit Diagnosis:  Abnormality of gait  Unsteadiness  Hemiparesis due to recent cerebral infarction  Generalized muscle weakness      Subjective Assessment - 03/29/15 0939    Subjective Pt denies falls.  Reports no pain.   Pertinent History Basal ganglia infarct 02/10/15 with expressive aphasia; prostate cancer, skin cancer, multiple myeloma (active; chemotherapy on Wednesdays)   Limitations Walking   Patient Stated Goals "To be able to function without the walker (rollator); better balance."   Currently in Pain? No/denies                         [facility] Adult PT Treatment/Exercise - 03/29/15 0001    Bed Mobility   Supine to Sit 6: Modified independent (Device/Increase time)   Sit to Supine 6: Modified independent (Device/Increase time)   Transfers   Transfers Sit to Stand;Stand to Sit   Sit to Stand 6: Modified independent (Device/Increase time);5: Supervision   Stand to Sit 6: Modified independent (Device/Increase time)   Ambulation/Gait   Ambulation/Gait Yes   Ambulation/Gait Assistance 4: Min assist;4: Min guard;6: Modified independent (Device/Increase time)   Ambulation/Gait Assistance Details x115' with SPC; remaining gait trials without AD   Ambulation Distance (Feet) 400 Feet   Assistive device None;Rollator;Straight cane   Gait Pattern Step-through pattern;Decreased dorsiflexion - right;Poor foot clearance - right;Trunk flexed;Decreased hip/knee flexion - right;Decreased weight shift to left;Narrow base of support;Scissoring  Mod A to recover from LOB due to RLE scissoring w/ L turn   Ambulation Surface Level;Indoor   Gait Comments --   Neuro Re-ed    Neuro Re-ed Details  --  Balance Exercises - 03/29/15 2038    Balance Exercises: Standing   Rockerboard Anterior/posterior;Lateral;EO;10 reps;Intermittent UE support;Other (comment)  A/P: static L semi tandem 4 x30 sec; active M/L shifting x20   Balance Beam RLE forward/retro stepping onto/off of blue foam beam x30 reps total; focused on lateral weight shift to L, RLE clearance             PT Short Term Goals - 03/27/15 7628    PT SHORT TERM GOAL #1   Title Pt will perform home exercises  with mod I using paper handout to indicate safe HEP compliance. Target date: 03/26/15   Baseline Met 8/15.   Status Achieved   PT SHORT TERM GOAL #2   Title Perform Berg Balance Scale to assess stability with functional standing balance.  Target date: 03/26/15   Baseline 8/1: score 29/56   Status Achieved   PT SHORT TERM GOAL #3   Title Pt will decrease TUG time from 21.58 to 17.5 seconds or less to indicate improved efficiency of functional mobility.  Target date: 03/26/15   Baseline 8/15: 16.95 sec (average of 3 trials) with rollator   Status Achieved   PT SHORT TERM GOAL #4   Title Pt will negotiate standard curb step with mod I using LRAD to indicate increased safety/independence with community mobility.  Target date: 03/26/15   Baseline Met 8/15.   Status Achieved   PT SHORT TERM GOAL #5   Title Pt will ambulate at self-selected gait speed of 2.63 ft/sec to reach status of community ambulator. Target date: 03/26/15   Baseline 8/15: 2.65 ft/sec with rollator   Status Achieved   PT SHORT TERM GOAL #6   Title Pt will perform sit > stand from standard chair without UE support to indicate increased functional LE strength.  Target date: 03/26/15   Baseline 8/23: sit > stand without UE support with supervision, cueing for full anterior weight shift   Status Partially Met           PT Long Term Goals - 03/05/15 1735    PT LONG TERM GOAL #1   Title Pt/spouse will verbalize understanding of CVA warning signs to indicate understanding of situation in which to seek medical attention. Target date: 04/23/15.   Baseline ____________________________   Status On-going   PT LONG TERM GOAL #2   Title Pt will ambulate x200' over indoor, level surfaces with mod I, increased time without AD to indicate increased pt independence with household mobility, progress toward pt-stated goal. Target date: 04/23/15.   Baseline ________________________________   Status On-going   PT LONG TERM GOAL #3   Title Pt  will perform TUG test in < 13.5 sconds to indicate decreased risk of falling. Target date: 04/23/15.   Baseline 21.5 seconds with rollator   Status On-going   PT LONG TERM GOAL #4   Title Pt will increase Berg Balance Scale score by 6 points from baseline to indicate improvement in functional standing balance. Target date: 04/23/15.   Baseline 8/1: scored 29/56   Status On-going   PT LONG TERM GOAL #5   Title Pt will ambulate x500' over outdoor, unlevel surfaces with mod I using LRAD with no overt LOB to indicate increased safety with community mobility. Target date: 04/23/15.   Status On-going               Plan - 03/29/15 2043    Clinical Impression Statement Focused on lateral weight shift to L, consistent RLE clearance during  gait. Pt with marked improvement in postural stability/control, as exhibited by decreased posterior preference when balance challenged.  Continue per POC.   Pt will benefit from skilled therapeutic intervention in order to improve on the following deficits Abnormal gait;Decreased balance;Difficulty walking;Decreased strength;Postural dysfunction;Decreased knowledge of use of DME;Decreased activity tolerance;Decreased endurance;Other (comment)   Rehab Potential Good   Clinical Impairments Affecting Rehab Potential Currently undergoing chemotherapy 1x/week   PT Frequency 2x / week   PT Duration 8 weeks   PT Treatment/Interventions ADLs/Self Care Home Management;Gait training;Therapeutic exercise;Patient/family education;Stair training;Balance training;Functional mobility training;Neuromuscular re-education;Manual techniques;Therapeutic activities;DME Instruction;Vestibular;Orthotic Fit/Training   PT Next Visit Plan Functional LE strength; Balance Master for COG control; hip strategy; L lateral weight shift during gait   Consulted and Agree with Plan of Care Patient        Problem List Patient Active Problem List   Diagnosis Date Noted  . Basal ganglia  infarction 02/12/2015  . Dizziness   . Expressive aphasia   . Past pointing   . Right leg weakness   . Acute ischemic stroke 02/11/2015  . CVA (cerebral infarction) 02/11/2015  . Aspiration pneumonia 02/11/2015  . Aphasia   . Difficulty speaking   . Speech difficult to understand   . Stroke   . Encounter for antineoplastic chemotherapy 01/31/2015  . Constipation 12/20/2014  . Rash 12/20/2014  . Cellulitis 12/06/2014  . Peripheral edema 12/06/2014  . Gait disorder 09/05/2014  . Multiple myeloma   . Paraplegia 06/21/2014  . Postoperative anemia due to acute blood loss 06/21/2014  . 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 Spine metastasis 06/17/2014  . Thoracic spine tumor 06/17/2014  . Metastatic bone cancer 06/15/2014    Billie Ruddy, PT, DPT [facility] [address] [place], [place], [address] Phone: [phone]   Fax:  [phone] 03/29/2015, 8:48 PM

## 2015-03-29 NOTE — Patient Instructions (Signed)
  Please complete the assigned speech therapy homework and return it to your next session.  

## 2015-04-03 ENCOUNTER — Ambulatory Visit: Payer: Medicare Other | Admitting: Speech Pathology

## 2015-04-03 ENCOUNTER — Ambulatory Visit: Payer: Medicare Other | Admitting: Physical Therapy

## 2015-04-03 DIAGNOSIS — R279 Unspecified lack of coordination: Secondary | ICD-10-CM | POA: Diagnosis not present

## 2015-04-03 DIAGNOSIS — I69359 Hemiplegia and hemiparesis following cerebral infarction affecting unspecified side: Secondary | ICD-10-CM

## 2015-04-03 DIAGNOSIS — R2681 Unsteadiness on feet: Secondary | ICD-10-CM

## 2015-04-03 DIAGNOSIS — R1313 Dysphagia, pharyngeal phase: Secondary | ICD-10-CM | POA: Diagnosis not present

## 2015-04-03 DIAGNOSIS — M6281 Muscle weakness (generalized): Secondary | ICD-10-CM | POA: Diagnosis not present

## 2015-04-03 DIAGNOSIS — R269 Unspecified abnormalities of gait and mobility: Secondary | ICD-10-CM | POA: Diagnosis not present

## 2015-04-03 DIAGNOSIS — R4701 Aphasia: Secondary | ICD-10-CM

## 2015-04-03 DIAGNOSIS — R6889 Other general symptoms and signs: Secondary | ICD-10-CM | POA: Diagnosis not present

## 2015-04-03 NOTE — Therapy (Signed)
[facility] [address] [place], [place], [address] Phone: [phone]   Fax:  [phone]  Physical Therapy Treatment  Patient Details  Name: Jeremiah Owens MRN: [id number] Date of Birth: 1929/12/16 Referring Provider:  Wenda Low, MD  Encounter Date: 04/03/2015      PT End of Session - 04/03/15 1745    Visit Number 9   Number of Visits 17   Date for PT Re-Evaluation 04/27/15   Authorization Type KX;  Medicare [employer] - G codes required every 10 visits   PT Start Time 7915   PT Stop Time 1400   PT Time Calculation (min) 43 min   Equipment Utilized During Treatment Gait belt   Activity Tolerance Patient tolerated treatment well   Behavior During Therapy WFL for tasks assessed/performed      Past Medical History  Diagnosis Date  . Compression fracture   . Allergy   . Anxiety   . Thyroid disease   . S/P radiation therapy 08/21/14-09/04/14    T4-6 25Gy/58f  . S/P radiation therapy 11/29/14-12/12/14    rt prox humerus/shoulder/scapula 25Gy/132f . Encounter for antineoplastic chemotherapy 01/31/2015  . Melanoma   . Bone cancer     t_spine T-5  . Prostate cancer 2002  . Skin cancer 1994    melanoma  right neck   . Multiple myeloma     Past Surgical History  Procedure Laterality Date  . Hernia repair    . Posterior cervical fusion/foraminotomy N/A 06/17/2014    Procedure: Thoracic five laminectomy for epidural tumor resection Thoracic 4-7 posterior lateral arthrodesis, segmental pedicle screw fixation.;  Surgeon: KyAshok PallMD;  [facility];  Service: Neurosurgery;  Laterality: N/A;    There were no vitals filed for this visit.  Visit Diagnosis:  Abnormality of gait  Unsteadiness  Hemiparesis due to recent cerebral infarction      Subjective Assessment - 04/03/15 1729    Subjective Pt reports mild pain in L shoulder, stating, "It's nothing new; I wouldn't even give it a  number." Pt denies falls.   Pertinent History Basal ganglia infarct 02/10/15 with expressive aphasia; prostate cancer, skin cancer, multiple myeloma (active; chemotherapy on Wednesdays)   Limitations Walking   Patient Stated Goals "To be able to function without the walker (rollator); better balance."   Currently in Pain? No/denies  Soreness in L shoulder; "I wouldn't even give it a number," per pt                         [facility] Adult PT Treatment/Exercise - 04/03/15 0001    Transfers   Transfers Sit to Stand;Stand to Sit   Sit to Stand 6: Modified independent (Device/Increase time);5: Supervision   Sit to Stand Details (indicate cue type and reason) supervision, cueing for setup (increased anterior pelvic tilt, full anterior weight shift) when performing sit > stand without UE assist x3 trials. Mod I when with single UE assist.    Stand to Sit 6: Modified independent (Device/Increase time)   Ambulation/Gait   Ambulation/Gait Yes   Ambulation/Gait Assistance 5: Supervision;4: Min guard;4: Min assist   Ambulation/Gait Assistance Details initial 1142without AD with min guard-min A; subsequent 230' with SBQC and min guard, cueing for sequencing, placement (due to pt tendency to maintain SBQC too close to body); final 345' [facility]    Ambulation Distance (Feet) 690 Feet  115', 230', and 345'   Assistive device  None;Small based quad cane;Straight cane   Gait Pattern Step-through pattern;Decreased dorsiflexion - right;Trunk flexed;Decreased hip/knee flexion - right;Decreased weight shift to left;Narrow base of support  Mod A to recover from LOB due to RLE scissoring w/ L turn   Ambulation Surface Level;Indoor   Ramp 5: Supervision   Ramp Details (indicate cue type and reason) using SPC   Curb 4: Min assist   Curb Details (indicate cue type and reason) min guard, cueing using SPC x3 trials with effective within-session carryover of sequencing   Gait Comments No  episodes of scissoring during this session.   Neuro Re-ed    Neuro Re-ed Details  Balance Master x13 consecutive minutes, pt engaged in dynamic COG control training in which pt performed multidirectional weight shifting to R, then L. Noted pt difficulty with lateral weight shifting to touch L targets.                PT Education - 04/03/15 1743    Education provided Yes   Education Details Recommended pt start using SPC for only short-distance (<75') walking in uncrowded area of home with wife standing arms-length away.   Person(s) Educated Patient   Methods Explanation   Comprehension Verbalized understanding          PT Short Term Goals - 03/27/15 0942    PT SHORT TERM GOAL #1   Title Pt will perform home exercises with mod I using paper handout to indicate safe HEP compliance. Target date: 03/26/15   Baseline Met 8/15.   Status Achieved   PT SHORT TERM GOAL #2   Title Perform Berg Balance Scale to assess stability with functional standing balance.  Target date: 03/26/15   Baseline 8/1: score 29/56   Status Achieved   PT SHORT TERM GOAL #3   Title Pt will decrease TUG time from 21.58 to 17.5 seconds or less to indicate improved efficiency of functional mobility.  Target date: 03/26/15   Baseline 8/15: 16.95 sec (average of 3 trials) with rollator   Status Achieved   PT SHORT TERM GOAL #4   Title Pt will negotiate standard curb step with mod I using LRAD to indicate increased safety/independence with community mobility.  Target date: 03/26/15   Baseline Met 8/15.   Status Achieved   PT SHORT TERM GOAL #5   Title Pt will ambulate at self-selected gait speed of 2.63 ft/sec to reach status of community ambulator. Target date: 03/26/15   Baseline 8/15: 2.65 ft/sec with rollator   Status Achieved   PT SHORT TERM GOAL #6   Title Pt will perform sit > stand from standard chair without UE support to indicate increased functional LE strength.  Target date: 03/26/15   Baseline 8/23:  sit > stand without UE support with supervision, cueing for full anterior weight shift   Status Partially Met   PT SHORT TERM GOAL #7   Title _________________________________________________________________________   Status Not Met   PT SHORT TERM GOAL #8   Title _________________________________________________________________   PT SHORT TERM GOAL #9   TITLE ____________________________________________________________________________________           PT Long Term Goals - 03/05/15 1735    PT LONG TERM GOAL #1   Title Pt/spouse will verbalize understanding of CVA warning signs to indicate understanding of situation in which to seek medical attention. Target date: 04/23/15.   Baseline ____________________________   Status On-going   PT LONG TERM GOAL #2   Title Pt will ambulate x200' over indoor, level  surfaces with mod I, increased time without AD to indicate increased pt independence with household mobility, progress toward pt-stated goal. Target date: 04/23/15.   Baseline ________________________________   Status On-going   PT LONG TERM GOAL #3   Title Pt will perform TUG test in < 13.5 sconds to indicate decreased risk of falling. Target date: 04/23/15.   Baseline 21.5 seconds with rollator   Status On-going   PT LONG TERM GOAL #4   Title Pt will increase Berg Balance Scale score by 6 points from baseline to indicate improvement in functional standing balance. Target date: 04/23/15.   Baseline 8/1: scored 29/56   Status On-going   PT LONG TERM GOAL #5   Title Pt will ambulate x500' over outdoor, unlevel surfaces with mod I using LRAD with no overt LOB to indicate increased safety with community mobility. Target date: 04/23/15.   Status On-going   PT LONG TERM GOAL #6   Title ______________________________________________________________________________   Baseline ___________________________________________               Plan - 04/03/15 1745    Clinical Impression  Statement Pt required only close supervision to ambulate over level, indoor surfaces using SPC today. During COG control training on Balance Master, noted pt continues to exhibit difficulty with lateral weight shift to L. Continue per POC.   Pt will benefit from skilled therapeutic intervention in order to improve on the following deficits Abnormal gait;Decreased balance;Difficulty walking;Decreased strength;Postural dysfunction;Decreased knowledge of use of DME;Decreased activity tolerance;Decreased endurance;Other (comment)   Clinical Impairments Affecting Rehab Potential Currently undergoing chemotherapy 1x/week   PT Frequency 2x / week   PT Duration 8 weeks   PT Treatment/Interventions ADLs/Self Care Home Management;Gait training;Therapeutic exercise;Patient/family education;Stair training;Balance training;Functional mobility training;Neuromuscular re-education;Manual techniques;Therapeutic activities;DME Instruction;Vestibular;Orthotic Fit/Training   PT Next Visit Plan G CODES. Lateral weight shift to L during gait and on Pension scheme manager.   PT Home Exercise Plan See Pt Instructions from 8/4.   Consulted and Agree with Plan of Care Patient        Problem List Patient Active Problem List   Diagnosis Date Noted  . Basal ganglia infarction 02/12/2015  . Dizziness   . Expressive aphasia   . Past pointing   . Right leg weakness   . Acute ischemic stroke 02/11/2015  . CVA (cerebral infarction) 02/11/2015  . Aspiration pneumonia 02/11/2015  . Aphasia   . Difficulty speaking   . Speech difficult to understand   . Stroke   . Encounter for antineoplastic chemotherapy 01/31/2015  . Constipation 12/20/2014  . Rash 12/20/2014  . Cellulitis 12/06/2014  . Peripheral edema 12/06/2014  . Gait disorder 09/05/2014  . Multiple myeloma   . Paraplegia 06/21/2014  . Postoperative anemia due to acute blood loss 06/21/2014  . 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 Spine metastasis 06/17/2014  .  Thoracic spine tumor 06/17/2014  . Metastatic bone cancer 06/15/2014    Billie Ruddy, PT, DPT [facility] [address] [place], [place], [address] Phone: [phone]   Fax:  [phone] 04/03/2015, 5:49 PM

## 2015-04-03 NOTE — Therapy (Signed)
[facility] [address] [place], [place], [address] Phone: [phone]   Fax:  [phone]  Speech Language Pathology Treatment  Patient Details  Name: Jeremiah Owens MRN: [id number] Date of Birth: 11-05-1929 Referring Provider:  Wenda Low, MD  Encounter Date: 04/03/2015      End of Session - 04/03/15 1448    Visit Number 8   Number of Visits 16   Date for SLP Re-Evaluation 04/25/15   SLP Start Time 1402   SLP Stop Time  1447   SLP Time Calculation (min) 45 min      Past Medical History  Diagnosis Date  . Compression fracture   . Allergy   . Anxiety   . Thyroid disease   . S/P radiation therapy 08/21/14-09/04/14    T4-6 25Gy/10fx  . S/P radiation therapy 11/29/14-12/12/14    rt prox humerus/shoulder/scapula 25Gy/53fx  . Encounter for antineoplastic chemotherapy 01/31/2015  . Melanoma   . Bone cancer     t_spine T-5  . Prostate cancer 2002  . Skin cancer 1994    melanoma  right neck   . Multiple myeloma     Past Surgical History  Procedure Laterality Date  . Hernia repair    . Posterior cervical fusion/foraminotomy N/A 06/17/2014    Procedure: Thoracic five laminectomy for epidural tumor resection Thoracic 4-7 posterior lateral arthrodesis, segmental pedicle screw fixation.;  Surgeon: Ashok Pall, MD;  Location: [facility];  Service: Neurosurgery;  Laterality: N/A;    There were no vitals filed for this visit.  Visit Diagnosis: Expressive aphasia  Pharyngeal dysphagia      Subjective Assessment - 04/03/15 1409    Subjective "I got some of my homework done - it was difficult"   Currently in Pain? No/denies               ADULT SLP TREATMENT - 04/03/15 1410    General Information   Behavior/Cognition Alert;Cooperative;Pleasant mood   Treatment Provided   Treatment provided Dysphagia;Cognitive-Linquistic   Dysphagia Treatment   Liquids provided via Cup   Cognitive-Linquistic  Treatment   Treatment focused on Apraxia   Skilled Treatment Pt demonsrated swallow HEP with rare min A. Pt tolerated PO solids regular solids/thin liquids without overt difficulty. Pt demonstrated good mastication and oral control of bolus. Voice clear after all PO (peanut butter crackers, H2O)  He demonstrated consistent head turn right for ST, however ? consistent carry over of this at home.  Recommend advance pt to regular solids and thin liquids with continued throrough mastication and head turn right. Continue swallow HEP daily 5/7 days.   Pt generated  7 items for categories with occasional min A            SLP Short Term Goals - 04/03/15 1447    SLP SHORT TERM GOAL #1   Title pt will produce sentence responses in mod complex tasks 80% and rare min A   Time 1   Period Weeks   Status On-going   SLP SHORT TERM GOAL #2   Title pt will demo HEP for swallowing with rare min A   Time 2   Period Weeks   Status Achieved   SLP SHORT TERM GOAL #3   Title pt will demo compensatory strategies for anomia with occasasional min A   Time 1   Period Weeks   Status On-going   SLP SHORT TERM GOAL #4   Title Pt's signature/ basic demographic info will not show any  signs of aphasia   Time 1   Period Weeks   Status New          SLP Long Term Goals - 04/03/15 1448    SLP LONG TERM GOAL #1   Title pt will demo 10 minutes mod complex conversation fucntionally with modified indpendence   Time 5   Period Weeks   Status On-going   SLP LONG TERM GOAL #2   Title pt will demo HEP for swallowing with modified independence   Time 5   Period Weeks   Status On-going   SLP LONG TERM GOAL #3   Title pt will write checks adequately with rare min cues   Time 5   Period Weeks   Status On-going          Plan - 04/03/15 1444    Clinical Impression Statement Advance to regular solids/thin liquids with thorough mastication, small bites and head turn right. Continue dysphagia HEP daily. Continue  skilled ST for diet tolerance and to maximize verbal and written expression.l    Speech Therapy Frequency 2x / week   Treatment/Interventions Internal/external aids;Compensatory strategies;SLP instruction and feedback;Patient/family education;Functional tasks;Cueing hierarchy;Pharyngeal strengthening exercises;Oral motor exercises;Compensatory techniques;Diet toleration management by SLP   Potential to Achieve Goals Good   Consulted and Agree with Plan of Care Patient        Problem List Patient Active Problem List   Diagnosis Date Noted  . Basal ganglia infarction 02/12/2015  . Dizziness   . Expressive aphasia   . Past pointing   . Right leg weakness   . Acute ischemic stroke 02/11/2015  . CVA (cerebral infarction) 02/11/2015  . Aspiration pneumonia 02/11/2015  . Aphasia   . Difficulty speaking   . Speech difficult to understand   . Stroke   . Encounter for antineoplastic chemotherapy 01/31/2015  . Constipation 12/20/2014  . Rash 12/20/2014  . Cellulitis 12/06/2014  . Peripheral edema 12/06/2014  . Gait disorder 09/05/2014  . Multiple myeloma   . Paraplegia 06/21/2014  . Postoperative anemia due to acute blood loss 06/21/2014  . 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 Spine metastasis 06/17/2014  . Thoracic spine tumor 06/17/2014  . Metastatic bone cancer 06/15/2014    Lovvorn, Annye Rusk MS, [place] 04/03/2015, 2:50 PM  [facility] [address] [place], [place], [address] Phone: [phone]   Fax:  [phone]

## 2015-04-03 NOTE — Patient Instructions (Signed)
   Advance to regular diet - continue thorough chewing to puree textures And head turn right  Eat slowly, no talking while food is in your mouth.   Continue working on naming items in categories

## 2015-04-04 ENCOUNTER — Other Ambulatory Visit (HOSPITAL_BASED_OUTPATIENT_CLINIC_OR_DEPARTMENT_OTHER): Payer: Medicare Other

## 2015-04-04 ENCOUNTER — Ambulatory Visit (HOSPITAL_BASED_OUTPATIENT_CLINIC_OR_DEPARTMENT_OTHER): Payer: Medicare Other

## 2015-04-04 VITALS — BP 117/50 | HR 70 | Temp 97.9°F | Resp 16

## 2015-04-04 DIAGNOSIS — Z5112 Encounter for antineoplastic immunotherapy: Secondary | ICD-10-CM | POA: Diagnosis present

## 2015-04-04 DIAGNOSIS — C9000 Multiple myeloma not having achieved remission: Secondary | ICD-10-CM | POA: Diagnosis present

## 2015-04-04 LAB — COMPREHENSIVE METABOLIC PANEL (CC13)
ALT: 21 U/L (ref 0–55)
ANION GAP: 8 meq/L (ref 3–11)
AST: 17 U/L (ref 5–34)
Albumin: 3.4 g/dL — ABNORMAL LOW (ref 3.5–5.0)
Alkaline Phosphatase: 56 U/L (ref 40–150)
BILIRUBIN TOTAL: 0.45 mg/dL (ref 0.20–1.20)
BUN: 19 mg/dL (ref 7.0–26.0)
CO2: 30 meq/L — AB (ref 22–29)
CREATININE: 0.9 mg/dL (ref 0.7–1.3)
Calcium: 9 mg/dL (ref 8.4–10.4)
Chloride: 105 mEq/L (ref 98–109)
EGFR: 76 mL/min/{1.73_m2} — ABNORMAL LOW (ref >90)
GLUCOSE: 85 mg/dL (ref 70–140)
Potassium: 3.8 mEq/L (ref 3.5–5.1)
SODIUM: 143 meq/L (ref 136–145)
TOTAL PROTEIN: 5.5 g/dL — AB (ref 6.4–8.3)

## 2015-04-04 LAB — CBC WITH DIFFERENTIAL/PLATELET
BASO%: 0.7 % (ref 0.0–2.0)
Basophils Absolute: 0 10*3/uL (ref 0.0–0.1)
EOS%: 9.7 % — ABNORMAL HIGH (ref 0.0–7.0)
Eosinophils Absolute: 0.3 10*3/uL (ref 0.0–0.5)
HCT: 36.2 % — ABNORMAL LOW (ref 38.4–49.9)
HEMOGLOBIN: 12.2 g/dL — AB (ref 13.0–17.1)
LYMPH%: 20.2 % (ref 14.0–49.0)
MCH: 32.7 pg (ref 27.2–33.4)
MCHC: 33.6 g/dL (ref 32.0–36.0)
MCV: 97.3 fL (ref 79.3–98.0)
MONO#: 0.4 10*3/uL (ref 0.1–0.9)
MONO%: 14.7 % — ABNORMAL HIGH (ref 0.0–14.0)
NEUT%: 54.7 % (ref 39.0–75.0)
NEUTROS ABS: 1.5 10*3/uL (ref 1.5–6.5)
PLATELETS: 111 10*3/uL — AB (ref 140–400)
RBC: 3.72 10*6/uL — AB (ref 4.20–5.82)
RDW: 17.8 % — AB (ref 11.0–14.6)
WBC: 2.7 10*3/uL — AB (ref 4.0–10.3)
lymph#: 0.6 10*3/uL — ABNORMAL LOW (ref 0.9–3.3)

## 2015-04-04 MED ORDER — BORTEZOMIB CHEMO SQ INJECTION 3.5 MG (2.5MG/ML)
1.3000 mg/m2 | Freq: Once | INTRAMUSCULAR | Status: AC
  Administered 2015-04-04: 2.5 mg via SUBCUTANEOUS
  Filled 2015-04-04: qty 2.5

## 2015-04-04 MED ORDER — ONDANSETRON 8 MG PO TBDP
8.0000 mg | ORAL_TABLET | Freq: Once | ORAL | Status: AC
  Administered 2015-04-04: 8 mg via ORAL
  Filled 2015-04-04: qty 1

## 2015-04-04 NOTE — Patient Instructions (Signed)
[facility] Discharge Instructions for Patients Receiving Chemotherapy  Today you received the following chemotherapy agents velcade.  To help prevent nausea and vomiting after your treatment, we encourage you to take your nausea medication .   If you develop nausea and vomiting that is not controlled by your nausea medication, call the clinic.   BELOW ARE SYMPTOMS THAT SHOULD BE REPORTED IMMEDIATELY:  *FEVER GREATER THAN 100.5 F  *CHILLS WITH OR WITHOUT FEVER  NAUSEA AND VOMITING THAT IS NOT CONTROLLED WITH YOUR NAUSEA MEDICATION  *UNUSUAL SHORTNESS OF BREATH  *UNUSUAL BRUISING OR BLEEDING  TENDERNESS IN MOUTH AND THROAT WITH OR WITHOUT PRESENCE OF ULCERS  *URINARY PROBLEMS  *BOWEL PROBLEMS  UNUSUAL RASH Items with * indicate a potential emergency and should be followed up as soon as possible.  Feel free to call the clinic you have any questions or concerns. The clinic phone number is [phone].  Please show the CHEMO ALERT CARD at check-in to the Emergency Department and triage nurse.   

## 2015-04-05 ENCOUNTER — Ambulatory Visit: Payer: Medicare Other | Attending: Physical Medicine & Rehabilitation | Admitting: Physical Therapy

## 2015-04-05 ENCOUNTER — Ambulatory Visit: Payer: Medicare Other

## 2015-04-05 DIAGNOSIS — R531 Weakness: Secondary | ICD-10-CM | POA: Diagnosis not present

## 2015-04-05 DIAGNOSIS — R1313 Dysphagia, pharyngeal phase: Secondary | ICD-10-CM | POA: Diagnosis not present

## 2015-04-05 DIAGNOSIS — R279 Unspecified lack of coordination: Secondary | ICD-10-CM | POA: Insufficient documentation

## 2015-04-05 DIAGNOSIS — R4701 Aphasia: Secondary | ICD-10-CM | POA: Insufficient documentation

## 2015-04-05 DIAGNOSIS — M6281 Muscle weakness (generalized): Secondary | ICD-10-CM | POA: Diagnosis not present

## 2015-04-05 DIAGNOSIS — R262 Difficulty in walking, not elsewhere classified: Secondary | ICD-10-CM | POA: Insufficient documentation

## 2015-04-05 DIAGNOSIS — I69359 Hemiplegia and hemiparesis following cerebral infarction affecting unspecified side: Secondary | ICD-10-CM | POA: Diagnosis not present

## 2015-04-05 DIAGNOSIS — R29898 Other symptoms and signs involving the musculoskeletal system: Secondary | ICD-10-CM | POA: Diagnosis not present

## 2015-04-05 DIAGNOSIS — R269 Unspecified abnormalities of gait and mobility: Secondary | ICD-10-CM | POA: Insufficient documentation

## 2015-04-05 DIAGNOSIS — R2681 Unsteadiness on feet: Secondary | ICD-10-CM

## 2015-04-05 NOTE — Patient Instructions (Signed)
  Please complete the assigned speech therapy homework prior to your next session.  

## 2015-04-05 NOTE — Therapy (Signed)
[facility] [address] [place], [place], [address] Phone: [phone]   Fax:  [phone]  Speech Language Pathology Treatment  Patient Details  Name: Jeremiah Owens MRN: [id number] Date of Birth: [birth year] Referring Provider:  Wenda Low, MD  Encounter Date: 04/05/2015      End of Session - 04/05/15 1125    Visit Number 9   Number of Visits 16   Date for SLP Re-Evaluation 04/25/15   Authorization Type G-CODE!   SLP Start Time 1018   SLP Stop Time  1102   SLP Time Calculation (min) 44 min   Activity Tolerance Patient tolerated treatment well      Past Medical History  Diagnosis Date  . Compression fracture   . Allergy   . Anxiety   . Thyroid disease   . S/P radiation therapy 08/21/14-09/04/14    T4-6 25Gy/81f  . S/P radiation therapy 11/29/14-12/12/14    rt prox humerus/shoulder/scapula 25Gy/129f . Encounter for antineoplastic chemotherapy 01/31/2015  . Melanoma   . Bone cancer     t_spine T-5  . Prostate cancer 2002  . Skin cancer 1994    melanoma  right neck   . Multiple myeloma     Past Surgical History  Procedure Laterality Date  . Hernia repair    . Posterior cervical fusion/foraminotomy N/A 06/17/2014    Procedure: Thoracic five laminectomy for epidural tumor resection Thoracic 4-7 posterior lateral arthrodesis, segmental pedicle screw fixation.;  Surgeon: KyAshok PallMD;  [facility];  Service: Neurosurgery;  Laterality: N/A;    There were no vitals filed for this visit.  Visit Diagnosis: Expressive aphasia      Subjective Assessment - 04/05/15 1026    Subjective "Fresh peaches tasted - mighty good this morning."               ADULT SLP TREATMENT - 04/05/15 1026    General Information   Behavior/Cognition Alert;Cooperative;Pleasant mood   Treatment Provided   Treatment provided Cognitive-Linquistic   Pain Assessment   Pain Assessment No/denies pain   Cognitive-Linquistic Treatment   Treatment focused on Aphasia   Skilled Treatment SLP drew pt's attention to words with aphasic errors in his homework. He ID'd errors 72% of the time, and was able to self correct 45% of the time. SLP mod-max cues were needed occasionally to correct errors, and occasional min A to ID errors. Pt remarked to SLP that work on his writing was important to him in writing lists, mostly. SLP gave pt work with functional divergent naming because of this.   Assessment / Recommendations / Plan   Plan Continue with current plan of care            SLP Short Term Goals - 04/05/15 1127    SLP SHORT TERM GOAL #1   Title pt will produce sentence responses in mod complex tasks 80% and rare min A   Time 1   Period Weeks   Status Not Met   SLP SHORT TERM GOAL #2   Title pt will demo HEP for swallowing with rare min A   Status Achieved   SLP SHORT TERM GOAL #3   Title pt will demo compensatory strategies for anomia with occasasional min A   Time 1   Period Weeks   Status Not Met   SLP SHORT TERM GOAL #4   Title Pt's signature/ basic demographic info will not show any signs of aphasia   Time 1  Period Weeks   Status Partially Met          SLP Long Term Goals - 04/05/15 1128    SLP LONG TERM GOAL #1   Title pt will demo 10 minutes mod complex conversation fucntionally with modified indpendence   Time 5   Period Weeks   Status On-going   SLP LONG TERM GOAL #2   Title pt will demo HEP for swallowing with modified independence   Time 5   Period Weeks   Status On-going   SLP LONG TERM GOAL #3   Title pt will write checks adequately with rare min cues   Time 5   Period Weeks   Status On-going          Plan - 04/05/15 1125    Clinical Impression Statement Pt cont with aphasic errors in his writing, with decr'd awareness of errors as well as greater need for SLP cues once errors are noted. Cont ST appropriate to cont to improve verbal expression and  functional written expression.   Speech Therapy Frequency 2x / week   Duration --  5 weeks   Treatment/Interventions Internal/external aids;Compensatory strategies;SLP instruction and feedback;Patient/family education;Functional tasks;Cueing hierarchy;Pharyngeal strengthening exercises;Oral motor exercises;Compensatory techniques;Diet toleration management by SLP   Potential to Achieve Goals Fair   Potential Considerations Severity of impairments        Problem List Patient Active Problem List   Diagnosis Date Noted  . Basal ganglia infarction 02/12/2015  . Dizziness   . Expressive aphasia   . Past pointing   . Right leg weakness   . Acute ischemic stroke 02/11/2015  . CVA (cerebral infarction) 02/11/2015  . Aspiration pneumonia 02/11/2015  . Aphasia   . Difficulty speaking   . Speech difficult to understand   . Stroke   . Encounter for antineoplastic chemotherapy 01/31/2015  . Constipation 12/20/2014  . Rash 12/20/2014  . Cellulitis 12/06/2014  . Peripheral edema 12/06/2014  . Gait disorder 09/05/2014  . Multiple myeloma   . Paraplegia 06/21/2014  . Postoperative anemia due to acute blood loss 06/21/2014  . 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 Spine metastasis 06/17/2014  . Thoracic spine tumor 06/17/2014  . Metastatic bone cancer 06/15/2014    [facility] , MS, CCC-SLP  04/05/2015, 11:29 AM  [facility] [address] [place], [place], [address] Phone: [phone]   Fax:  [phone]

## 2015-04-05 NOTE — Therapy (Signed)
[facility] [address] [place], [place], [address] Phone: [phone]   Fax:  [phone]  Physical Therapy Treatment  Patient Details  Name: Jeremiah Owens MRN: [id number] Date of Birth: 1930/04/27 Referring Provider:  Wenda Low, MD  Encounter Date: 04/05/2015      PT End of Session - 04/05/15 1210    Visit Number 10   Number of Visits 17   Date for PT Re-Evaluation 04/27/15   Authorization Type KX;  Medicare [employer] - G codes required every 10 visits   PT Start Time 1104   PT Stop Time 1146   PT Time Calculation (min) 42 min   Equipment Utilized During Treatment Gait belt   Activity Tolerance Patient tolerated treatment well   Behavior During Therapy WFL for tasks assessed/performed      Past Medical History  Diagnosis Date  . Compression fracture   . Allergy   . Anxiety   . Thyroid disease   . S/P radiation therapy 08/21/14-09/04/14    T4-6 25Gy/66f  . S/P radiation therapy 11/29/14-12/12/14    rt prox humerus/shoulder/scapula 25Gy/13f . Encounter for antineoplastic chemotherapy 01/31/2015  . Melanoma   . Bone cancer     t_spine T-5  . Prostate cancer 2002  . Skin cancer 1994    melanoma  right neck   . Multiple myeloma     Past Surgical History  Procedure Laterality Date  . Hernia repair    . Posterior cervical fusion/foraminotomy N/A 06/17/2014    Procedure: Thoracic five laminectomy for epidural tumor resection Thoracic 4-7 posterior lateral arthrodesis, segmental pedicle screw fixation.;  Surgeon: KyAshok PallMD;  [facility];  Service: Neurosurgery;  Laterality: N/A;    There were no vitals filed for this visit.  Visit Diagnosis:  Abnormality of gait  Unsteadiness  Hemiparesis due to recent cerebral infarction  Generalized muscle weakness      Subjective Assessment - 04/05/15 1113    Subjective "I went to get ice cream with my family yesterday. They had to  help me up 3 stairs to get to the ice cream place." Per pt, 3 stairs had 2 rails, but pt unable to reach both. Pt did purchase a SPC since last session. Pt states, "I've been unable to try it yet. JoDenice Paradisewife) hasn't been able to stand beside my. My son-in-law has been staying with [place] JoDenice Paradise[place] been really busy." Pt denies falls.   Pertinent History Basal ganglia infarct 02/10/15 with expressive aphasia; prostate cancer, skin cancer, multiple myeloma (active; chemotherapy on Wednesdays)   Limitations Walking   Patient Stated Goals "To be able to function without the walker (rollator); better balance."   Currently in Pain? No/denies                         [facility] Adult PT Treatment/Exercise - 04/05/15 0001    Transfers   Transfers Sit to Stand;Stand to Sit   Sit to Stand 6: Modified independent (Device/Increase time);5: Supervision;4: Min guard   Sit to Stand Details Tactile cues for weight shifting   Sit to Stand Details (indicate cue type and reason) Mod I for sit <> stand with single UE use, use of rollator/SPC. For sit > stand without UE use, without AD, pt required supervision-min guard, tactile cueing for full anterior weight shift.   Stand to Sit 6: Modified independent (Device/Increase time)   Stand to Sit Details mod I for stand >  sit without UE use   Ambulation/Gait   Ambulation/Gait Yes   Ambulation/Gait Assistance 5: Supervision;4: Min guard;4: Min assist   Ambulation/Gait Assistance Details limited R hip/knee flexion noted only outdoors and with significant fatigue (>200' consecutively) this session   Ambulation Distance (Feet) 550 Feet  x130' outdoors; remaining distance indoors   Assistive device Straight cane   Gait Pattern Step-through pattern;Decreased dorsiflexion - right;Trunk flexed;Decreased weight shift to left;Right foot flat;Decreased hip/knee flexion - right   Ambulation Surface Level;Unlevel;Indoor;Outdoor;Paved   Stairs Yes   Stairs Assistance 5:  Supervision;4: Min guard   Stairs Assistance Details (indicate cue type and reason) Explained, demonstrated technique for negotiating stairs with single rail using SPC with step-to pattern using R rail x4 steps, with L rail x4 reps with min guard, cueing for sequencing. Final trial x4 steps laterally with BUE support at R rail wih supervision.   Number of Stairs 12   Height of Stairs 6   Ramp 5: Supervision   Curb 4: Min assist   Curb Details (indicate cue type and reason) blocked practice x4 consecutive trials with SPC; cueing initially for sequencing with effective within-session carryover.   Gait Comments No episodes of scissoring, no significant LOB during gait trials with SPC during this session. When ambulating over outdoor surfaces x130' with SPC, pt required min guard-min A due to R toe drag (no significant toe catch/LOB) when ambulating up inclined sidewalk.   Standardized Balance Assessment   Standardized Balance Assessment Timed Up and Go Test   Timed Up and Go Test   TUG Normal TUG  with rollator   Normal TUG (seconds) 15.94  average of 3 trials   Neuro Re-ed    Neuro Re-ed Details  Balance Master x13 consecutive minutes, pt engaged in dynamic COG control training in which pt performed multidirectional weight shifting to R, then L. Noted pt difficulty with lateral weight shifting to touch L targets.                PT Education - 04/05/15 1130    Education provided Yes   Education Details Reiterated recommendation for wife to be arms-length away when pt practing short-distance (< 13') walking with single point cane within home.   Person(s) Educated Patient   Methods Explanation   Comprehension Verbalized understanding          PT Short Term Goals - 04/05/15 1133    PT SHORT TERM GOAL #1   Title Pt will perform home exercises with mod I using paper handout to indicate safe HEP compliance. Target date: 03/26/15   Baseline Met 8/15.   Status Achieved   PT SHORT TERM  GOAL #2   Title Perform Berg Balance Scale to assess stability with functional standing balance.  Target date: 03/26/15   Baseline 8/1: score 29/56   Status Achieved   PT SHORT TERM GOAL #3   Title Pt will decrease TUG time from 21.58 to 17.5 seconds or less to indicate improved efficiency of functional mobility.  Target date: 03/26/15   Baseline 9/1: 15.94 sec (average of 3 trials) with rollator   Status Achieved   PT SHORT TERM GOAL #4   Title Pt will negotiate standard curb step with mod I using LRAD to indicate increased safety/independence with community mobility.  Target date: 03/26/15   Baseline Met 8/15.   Status Achieved   PT SHORT TERM GOAL #5   Title Pt will ambulate at self-selected gait speed of 2.63 ft/sec to reach status of  community ambulator. Target date: 03/26/15   Baseline 8/15: 2.65 ft/sec with rollator   Status Achieved   PT SHORT TERM GOAL #6   Title Pt will perform sit > stand from standard chair without UE support to indicate increased functional LE strength.  Target date: 03/26/15   Baseline 8/23: sit > stand without UE support with supervision, cueing for full anterior weight shift   Status Partially Met           PT Long Term Goals - 04/05/15 1151    PT LONG TERM GOAL #1   Title Pt/spouse will verbalize understanding of CVA warning signs to indicate understanding of situation in which to seek medical attention. Target date: 04/23/15.   Status On-going   PT LONG TERM GOAL #2   Title Pt will ambulate x200' over indoor, level surfaces with SPC to indicate increased pt independence with household mobility, progress toward pt-stated goal. Target date: 04/23/15.   Baseline 9/1: Revised from ambulation without AD to with [facility]   Status Revised   PT LONG TERM GOAL #3   Title Pt will perform TUG test in < 13.5 sconds to indicate decreased risk of falling. Target date: 04/23/15.   Baseline 9/1: 15.94 seconds with rollator.   Status On-going   PT LONG TERM GOAL #4   Title  Pt will increase Berg Balance Scale score by 6 points from baseline to indicate improvement in functional standing balance. Target date: 04/23/15.   Baseline 8/1: scored 29/56   Status On-going   PT LONG TERM GOAL #5   Title Pt will ambulate x500' over unlevel, paved surfaces with mod I using single point cane with no overt LOB to indicate increased safety with community mobility, progress toward pt-stated goal of walking with SPC. Target date: 04/23/15.   Baseline 9/1: Revised to allow for [facility] due to pt progress.   Status Revised   Additional Long Term Goals   Additional Long Term Goals Yes   PT LONG TERM GOAL #6   Title Pt will negotiate 4 stairs with 1 rail with mod I using LRAD to indicate increased independence with community access. Target date: 04/23/15.   Status New   PT LONG TERM GOAL #7   Title Pt will negotiate standard ramp and curb step with mod I using SPC to indicate safety traversing community obstacles. Target date: 04/23/15.   Status New               Plan - 04/05/15 1213    Clinical Impression Statement Session focused on community obstacle negotiation, gait training with SPC. Pt improved TUG time to 15.94 seconds (as compared with 21.58 seconds on evaluation). Discussed progress with pt, who expressed desire to address stair, curb step negotiation. Therefore, 2 LTG's added to address obstacle negotiation with [facility] and stair negotiation. Continue per POC.   Pt will benefit from skilled therapeutic intervention in order to improve on the following deficits Abnormal gait;Decreased balance;Difficulty walking;Decreased strength;Postural dysfunction;Decreased knowledge of use of DME;Decreased activity tolerance;Decreased endurance;Other (comment)   Rehab Potential Good   Clinical Impairments Affecting Rehab Potential Currently undergoing chemotherapy 1x/week   PT Frequency 2x / week   PT Duration 8 weeks   PT Treatment/Interventions ADLs/Self Care Home Management;Gait  training;Therapeutic exercise;Patient/family education;Stair training;Balance training;Functional mobility training;Neuromuscular re-education;Manual techniques;Therapeutic activities;DME Instruction;Vestibular;Orthotic Fit/Training   PT Next Visit Plan Continue gait training with SPC. Revisit stair negotiation with single rail, curb step negotiation with SPC.    Consulted and Agree with Plan of  Care Patient          G-Codes - 2015/04/20 1118    Functional Assessment Tool Used TUG: 15.94 sec with rollator   Functional Limitation Mobility: Walking and moving around   Mobility: Walking and Moving Around Current Status [phone]) At least 20 percent but less than 40 percent impaired, limited or restricted   Mobility: Walking and Moving Around Goal Status [phone]) At least 1 percent but less than 20 percent impaired, limited or restricted     Physical Therapy Progress Note  Dates of Reporting Period: 02/26/15 to April 20, 2015  Objective Reports of Subjective Statement: See Subjective section above.  Objective Measurements: TUG, Berg,  Self-selected gait speed  Goal Update: Added LTG's to address safety traversing community obstacles. Household and community ambulation goals revised to include single point cane as opposed to no AD and LRAD, respectively.  Plan: Continue per POC   Reason Skilled Services are Required: to maximize stability/independence with functional mobility, decrease risk of falling    Problem List Patient Active Problem List   Diagnosis Date Noted  . Basal ganglia infarction 02/12/2015  . Dizziness   . Expressive aphasia   . Past pointing   . Right leg weakness   . Acute ischemic stroke 02/11/2015  . CVA (cerebral infarction) 02/11/2015  . Aspiration pneumonia 02/11/2015  . Aphasia   . Difficulty speaking   . Speech difficult to understand   . Stroke   . Encounter for antineoplastic chemotherapy 01/31/2015  . Constipation 12/20/2014  . Rash 12/20/2014  . Cellulitis  12/06/2014  . Peripheral edema 12/06/2014  . Gait disorder 09/05/2014  . Multiple myeloma   . Paraplegia 06/21/2014  . Postoperative anemia due to acute blood loss 06/21/2014  . 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 Spine metastasis 06/17/2014  . Thoracic spine tumor 06/17/2014  . Metastatic bone cancer 06/15/2014    Billie Ruddy, PT, DPT [facility] [address] [place], [place], [address] Phone: [phone]   Fax:  [phone] April 20, 2015, 12:19 PM

## 2015-04-10 ENCOUNTER — Ambulatory Visit: Payer: Medicare Other | Admitting: Physical Therapy

## 2015-04-10 ENCOUNTER — Encounter: Payer: Self-pay | Admitting: Physical Medicine & Rehabilitation

## 2015-04-10 ENCOUNTER — Encounter: Payer: Medicare Other | Attending: Physical Medicine & Rehabilitation | Admitting: Physical Medicine & Rehabilitation

## 2015-04-10 ENCOUNTER — Ambulatory Visit: Payer: Medicare Other

## 2015-04-10 VITALS — BP 114/64 | HR 71

## 2015-04-10 DIAGNOSIS — R269 Unspecified abnormalities of gait and mobility: Secondary | ICD-10-CM | POA: Insufficient documentation

## 2015-04-10 DIAGNOSIS — I639 Cerebral infarction, unspecified: Secondary | ICD-10-CM | POA: Diagnosis not present

## 2015-04-10 DIAGNOSIS — G8220 Paraplegia, unspecified: Secondary | ICD-10-CM | POA: Insufficient documentation

## 2015-04-10 DIAGNOSIS — R29898 Other symptoms and signs involving the musculoskeletal system: Secondary | ICD-10-CM | POA: Diagnosis not present

## 2015-04-10 DIAGNOSIS — I69359 Hemiplegia and hemiparesis following cerebral infarction affecting unspecified side: Secondary | ICD-10-CM

## 2015-04-10 DIAGNOSIS — C9000 Multiple myeloma not having achieved remission: Secondary | ICD-10-CM | POA: Insufficient documentation

## 2015-04-10 DIAGNOSIS — R4701 Aphasia: Secondary | ICD-10-CM

## 2015-04-10 DIAGNOSIS — I6381 Other cerebral infarction due to occlusion or stenosis of small artery: Secondary | ICD-10-CM

## 2015-04-10 DIAGNOSIS — R2681 Unsteadiness on feet: Secondary | ICD-10-CM

## 2015-04-10 DIAGNOSIS — M6281 Muscle weakness (generalized): Secondary | ICD-10-CM | POA: Diagnosis not present

## 2015-04-10 DIAGNOSIS — R1313 Dysphagia, pharyngeal phase: Secondary | ICD-10-CM

## 2015-04-10 NOTE — Progress Notes (Signed)
Subjective:    Patient ID: Jeremiah Owens, male    DOB: [birth year], 81 y.o.   MRN: [id number]  HPI   Mr. Gulino is here in follow up of his multiple gait issues as they pertain to his previous myelopathy and new left brain infarct. He is using a cane at home sometimes but more often is using his walker. He is not progressing as fast as "he wants." He still is struggling with his language, in particularly word findings. He is also finding that his writing is "horrible" from a standpoint of legibility.  His mood has been good. He denies any pain. His wife remains supportive as always.   He remains in PT and SLP at neuro-rehab---he has appts today. He sees oncology tomorrow.   Pain Inventory Average Pain 0 Pain Right Now 0 My pain is no pain  In the last 24 hours, has pain interfered with the following? General activity 0 Relation with others 0 Enjoyment of life 0 What TIME of day is your pain at its worst? no pain Sleep (in general) Good  Pain is worse with: no pain  Pain improves with: no pain  Relief from Meds: no pain   Mobility use a walker how many minutes can you walk? 15 ability to climb steps?  yes do you drive?  no  Function retired  Neuro/Psych bladder control problems bowel control problems  Prior Studies Any changes since last visit?  no  Physicians involved in your care Any changes since last visit?  no   Family History  Problem Relation Age of Onset  . Cancer Mother     breast   Social History   Social History  . Marital Status: Married    Spouse Name: N/A  . Number of Children: N/A  . Years of Education: N/A   Social History Main Topics  . Smoking status: Never Smoker   . Smokeless tobacco: None  . Alcohol Use: No  . Drug Use: No  . Sexual Activity: Not Asked   Other Topics Concern  . None   Social History Narrative   Past Surgical History  Procedure Laterality Date  . Hernia repair    . Posterior cervical fusion/foraminotomy  N/A 06/17/2014    Procedure: Thoracic five laminectomy for epidural tumor resection Thoracic 4-7 posterior lateral arthrodesis, segmental pedicle screw fixation.;  Surgeon: Ashok Pall, MD;  Location: [facility];  Service: Neurosurgery;  Laterality: N/A;   Past Medical History  Diagnosis Date  . Compression fracture   . Allergy   . Anxiety   . Thyroid disease   . S/P radiation therapy 08/21/14-09/04/14    T4-6 25Gy/53f  . S/P radiation therapy 11/29/14-12/12/14    rt prox humerus/shoulder/scapula 25Gy/120f . Encounter for antineoplastic chemotherapy 01/31/2015  . Melanoma   . Bone cancer     t_spine T-5  . Prostate cancer 2002  . Skin cancer 1994    melanoma  right neck   . Multiple myeloma    BP 114/64 mmHg  Pulse 71  SpO2 98%  Opioid Risk Score:   Fall Risk Score:  `1  Depression screen PHQ 2/9  [facility] 04/10[facility]2015 11/07/2014  Decreased Interest 0 0  Down, Depressed, Hopeless 0 0  PHQ - 2 Score 0 0  Altered sleeping 1 0  Tired, decreased energy 1 2  Change in appetite 0 0  Feeling bad or failure about yourself  0 0  Trouble concentrating 0 1  Moving slowly  or fidgety/restless 0 0  Suicidal thoughts 0 0  PHQ-9 Score 2 3     Review of Systems  Gastrointestinal: Positive for constipation.  Genitourinary:       Bladder control problems  All other systems reviewed and are negative.      Objective:   Physical Exam  Constitutional: He is oriented to person, place, and time.  HENT:  Eyes: EOM are normal.  Neck: Normal range of motion. Neck supple. No thyromegaly present.  Cardiovascular: Normal rate and regular rhythm. no murmurs, rubs Respiratory: Effort normal and breath sounds normal. No respiratory distress.  GI: Soft. Bowel sounds are normal. He exhibits no distension.  Neurological: He is alert and oriented to person, place, and time.  Speech is  intelligible. Mild right central 7. He is oriented to person place, date.  RUE: 4/5 prox  to distal. Decreased [facility] RUE with mild intention tremor. Writing is sloppy but legible. RLE: 4- hf, 4/5 ke, adf/apf. LLE: 4+/5. Decreased proprioception in both legs (chronic). Ambulates with walker with---shuffles a bit, but fair standing posture. Good with change in direction.  Skin: Skin is warm and dry.  Psychiatric: He has a normal mood and affect. His behavior is normal    Assessment/Plan: 1. Functional deficits secondary to left basal ganglia infarct  -has made nice improvements  -encouraged continued use of his right hand for fine motor tasks  -discussed language exercises for at home  -continue outpt PT and SLP 2. History of multiple myeloma with thoracic spinal metastasis.   -mgt per Dr. Earlie Server 9. Aspiration pneumonia. Lungs remain clear 10. BPH. . Continue Flomax 11. Diarrhea: resolved  Follow up in 3 months. Fifteen minutes of face to face patient care time were spent during this visit. All questions were encouraged and answered.

## 2015-04-10 NOTE — Patient Instructions (Signed)
CONTINUE TO WORK DAILY ON YOUR LANGUAGE AND FINE MOTOR TASKS.

## 2015-04-10 NOTE — Therapy (Addendum)
[facility] [address] [place], [place], [address] Phone: [phone]   Fax:  [phone]  Physical Therapy Treatment  Patient Details  Name: Jeremiah Owens MRN: [id number] Date of Birth: [birth year] Referring Provider:  Wenda Low, MD  Encounter Date: 04/10/2015      PT End of Session - 04/10/15 1901    Visit Number 11   Number of Visits 17   Date for PT Re-Evaluation 04/27/15   Authorization Type KX;  Medicare [employer] - G codes required every 10 visits   PT Start Time 1404   PT Stop Time 1446   PT Time Calculation (min) 42 min   Activity Tolerance Patient tolerated treatment well   Behavior During Therapy WFL for tasks assessed/performed      Past Medical History  Diagnosis Date  . Compression fracture   . Allergy   . Anxiety   . Thyroid disease   . S/P radiation therapy 08/21/14-09/04/14    T4-6 25Gy/58f  . S/P radiation therapy 11/29/14-12/12/14    rt prox humerus/shoulder/scapula 25Gy/137f . Encounter for antineoplastic chemotherapy 01/31/2015  . Melanoma   . Bone cancer     t_spine T-5  . Prostate cancer 2002  . Skin cancer 1994    melanoma  right neck   . Multiple myeloma     Past Surgical History  Procedure Laterality Date  . Hernia repair    . Posterior cervical fusion/foraminotomy N/A 06/17/2014    Procedure: Thoracic five laminectomy for epidural tumor resection Thoracic 4-7 posterior lateral arthrodesis, segmental pedicle screw fixation.;  Surgeon: KyAshok PallMD;  [facility];  Service: Neurosurgery;  Laterality: N/A;    There were no vitals filed for this visit.  Visit Diagnosis:  Abnormality of gait  Unsteadiness  Hemiparesis due to recent cerebral infarction      Subjective Assessment - 04/10/15 1407    Subjective Pt reports having walked short household distances with cane with supervision of wife. Denies falls. Reports no pain.   Pertinent History Basal  ganglia infarct 02/10/15 with expressive aphasia; prostate cancer, skin cancer, multiple myeloma (active; chemotherapy on Wednesdays)   Limitations Walking   Patient Stated Goals "To be able to function without the walker (rollator); better balance."   Currently in Pain? No/denies                         [facility] Adult PT Treatment/Exercise - 04/10/15 0001    Transfers   Transfers Sit to Stand;Stand to Sit   Sit to Stand 6: Modified independent (Device/Increase time)   Sit to Stand Details --   Sit to Stand Details (indicate cue type and reason) with SPC   Stand to Sit 6: Modified independent (Device/Increase time)   [facility] Ambulation/Gait   Ambulation/Gait Yes   Ambulation/Gait Assistance 5: Supervision;4: Min guard;4: Min assist   Ambulation Distance (Feet) 575 Feet  115' x5   Assistive device Straight cane   Gait Pattern Step-through pattern;Decreased dorsiflexion - right;Trunk flexed;Decreased weight shift to left;Right foot flat;Decreased hip/knee flexion - right;Poor foot clearance - right  poor R foot clearance with increased fatigue   Ambulation Surface Level;Indoor   Stairs Yes   Stairs Assistance 5: Supervision;4: Min guard   Stairs Assistance Details (indicate cue type and reason) Negotiated 4 stairs x3 consecutive trials with single rail; initial trial with RUE support at R rail, subsequent trial with LUE support  at L rail. Initial 2 trials performed forward-facing with mod I to ascend, (S) to descend. With verbal/demonstration cueing, pt able to recall technique for negotiating stairs laterally, which pt performed with mod I to ascend and descend.   Number of Stairs 12   Height of Stairs 6   Ramp 5: Supervision   Curb 4: Min assist   Curb Details (indicate cue type and reason) performed x6 consecutive reps with [facility]; initially required cueing for sequencing. Pt exhibited between-session carryover of LE sequencing. After seated rest break,  pt performed x3 additional trials with effective within-session carryover of sequencing SPC and LE's.   Gait Comments [employer] obstacle e negotiation, surface changes into gait trials. No LE scissoring observed with gait during this session. Did note  decreased RLE clearance with increased fatigue (during final gait trial x115')/   Standardized Balance Assessment   Standardized Balance Assessment --   Timed Up and Go Test   TUG --   Normal TUG (seconds) --   Self-Care   Self-Care Other Self-Care Comments   Other Self-Care Comments  Discussed CVA warning signs, risk factors (emphasis on modifiable risk factors pertinent to patient). Provided paper handout to facilitate carryover.   Neuro Re-ed    Neuro Re-ed Details  --                PT Education - 04/10/15 1858    Education provided Yes   Education Details CVA warning signs, risk factors.   Person(s) Educated Patient   Methods Explanation;Handout   Comprehension Verbalized understanding          PT Short Term Goals - 04/05/15 1133    PT SHORT TERM GOAL #1   Title Pt will perform home exercises with mod I using paper handout to indicate safe HEP compliance. Target date: 03/26/15   Baseline Met 8/15.   Status Achieved   PT SHORT TERM GOAL #2   Title Perform Berg Balance Scale to assess stability with functional standing balance.  Target date: 03/26/15   Baseline 8/1: score 29/56   Status Achieved   PT SHORT TERM GOAL #3   Title Pt will decrease TUG time from 21.58 to 17.5 seconds or less to indicate improved efficiency of functional mobility.  Target date: 03/26/15   Baseline 9/1: 15.94 sec (average of 3 trials) with rollator   Status Achieved   PT SHORT TERM GOAL #4   Title Pt will negotiate standard curb step with mod I using LRAD to indicate increased safety/independence with community mobility.  Target date: 03/26/15   Baseline Met 8/15.   Status Achieved   PT SHORT TERM GOAL #5   Title Pt will ambulate at  self-selected gait speed of 2.63 ft/sec to reach status of community ambulator. Target date: 03/26/15   Baseline 8/15: 2.65 ft/sec with rollator   Status Achieved   PT SHORT TERM GOAL #6   Title Pt will perform sit > stand from standard chair without UE support to indicate increased functional LE strength.  Target date: 03/26/15   Baseline 8/23: sit > stand without UE support with supervision, cueing for full anterior weight shift   Status Partially Met           PT Long Term Goals - 04/10/15 1416    PT LONG TERM GOAL #1   Title Pt/spouse will verbalize understanding of CVA warning signs to indicate understanding of situation in which to seek medical attention. Target date: 04/23/15.   Status On-going  PT LONG TERM GOAL #2   Title Pt will ambulate x200' over indoor, level surfaces with mod I using SPC to indicate increased pt independence with household mobility, progress toward pt-stated goal. Target date: 04/23/15.   Baseline --   Status On-going   PT LONG TERM GOAL #3   Title Pt will perform TUG test in < 13.5 sconds to indicate decreased risk of falling. Target date: 04/23/15.   Baseline 9/1: 15.94 seconds with rollator.   Status On-going   PT LONG TERM GOAL #4   Title Pt will increase Berg Balance Scale score by 6 points from baseline to indicate improvement in functional standing balance. Target date: 04/23/15.   Baseline 8/1: scored 29/56   Status On-going   PT LONG TERM GOAL #5   Title Pt will ambulate x500' over unlevel, paved surfaces with mod I using single point cane with no overt LOB to indicate increased safety with community mobility, progress toward pt-stated goal of walking with SPC. Target date: 04/23/15.   Baseline --   Status On-going   PT LONG TERM GOAL #6   Title Pt will negotiate 4 stairs with 1 rail with mod I using LRAD to indicate increased independence with community access. Target date: 04/23/15.   Status On-going   PT LONG TERM GOAL #7   Title Pt will  negotiate standard ramp and curb step with mod I using SPC to indicate safety traversing community obstacles. Target date: 04/23/15.   Status On-going          Addendum:     Plan - 04/11/15 1040    Clinical Impression Statement Skilled session focused on gait training with SPC with emphasis on stair and curb step negotiation. Provided education on CVA warning signs during prolonged seatd rest break. Continue per POC.   Pt will benefit from skilled therapeutic intervention in order to improve on the following deficits Abnormal gait;Decreased balance;Difficulty walking;Decreased strength;Postural dysfunction;Decreased knowledge of use of DME;Decreased activity tolerance;Decreased endurance;Other (comment)   Rehab Potential Good   Clinical Impairments Affecting Rehab Potential Currently undergoing chemotherapy 1x/week   PT Frequency 2x / week   PT Duration 8 weeks   PT Treatment/Interventions ADLs/Self Care Home Management;Gait training;Therapeutic exercise;Patient/family education;Stair training;Balance training;Functional mobility training;Neuromuscular re-education;Manual techniques;Therapeutic activities;DME Instruction;Vestibular;Orthotic Fit/Training   PT Next Visit Plan Gait training with [place] outdoors. Weight shifting (to L, grading) on Balance Master.   Consulted and Agree with Plan of Care Patient        Problem List Patient Active Problem List   Diagnosis Date Noted  . Basal ganglia infarction 02/12/2015  . Dizziness   . Expressive aphasia   . Past pointing   . Right leg weakness   . Acute ischemic stroke 02/11/2015  . CVA (cerebral infarction) 02/11/2015  . Aspiration pneumonia 02/11/2015  . Aphasia   . Difficulty speaking   . Speech difficult to understand   . Stroke   . Encounter for antineoplastic chemotherapy 01/31/2015  . Constipation 12/20/2014  . Rash 12/20/2014  . Cellulitis 12/06/2014  . Peripheral edema 12/06/2014  . Gait disorder 09/05/2014  .  Multiple myeloma   . Paraplegia 06/21/2014  . Postoperative anemia due to acute blood loss 06/21/2014  . 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 Spine metastasis 06/17/2014  . Thoracic spine tumor 06/17/2014  . Metastatic bone cancer 06/15/2014    Billie Ruddy, PT, DPT [facility] [address] [place], [place], [address] Phone: [phone]   Fax:  [phone]  04/12/2015, 10:44 AM

## 2015-04-10 NOTE — Therapy (Addendum)
[facility] [address] [place], [place], [address] Phone: [phone]   Fax:  [phone]  Speech Language Pathology Treatment  Patient Details  Name: Jeremiah Owens MRN: [id number] Date of Birth: 1930/05/28 Referring Provider:  Wenda Low, MD  Encounter Date: 04/10/2015      End of Session - 04/10/15 1413    Visit Number 10   Number of Visits 16   Date for SLP Re-Evaluation 04/25/15   Authorization Type G-CODE!   SLP Start Time 8592   SLP Stop Time  1400   SLP Time Calculation (min) 43 min   Activity Tolerance Patient tolerated treatment well      Past Medical History  Diagnosis Date  . Compression fracture   . Allergy   . Anxiety   . Thyroid disease   . S/P radiation therapy 08/21/14-09/04/14    T4-6 25Gy/67f  . S/P radiation therapy 11/29/14-12/12/14    rt prox humerus/shoulder/scapula 25Gy/178f . Encounter for antineoplastic chemotherapy 01/31/2015  . Melanoma   . Bone cancer     t_spine T-5  . Prostate cancer 2002  . Skin cancer 1994    melanoma  right neck   . Multiple myeloma     Past Surgical History  Procedure Laterality Date  . Hernia repair    . Posterior cervical fusion/foraminotomy N/A 06/17/2014    Procedure: Thoracic five laminectomy for epidural tumor resection Thoracic 4-7 posterior lateral arthrodesis, segmental pedicle screw fixation.;  Surgeon: KyAshok PallMD;  [facility];  Service: Neurosurgery;  Laterality: N/A;    There were no vitals filed for this visit.  Visit Diagnosis: Expressive aphasia  Pharyngeal dysphagia      Subjective Assessment - 04/10/15 1329    Subjective "He's not happy with  - I'm not doing enough coordinating. He want s me to use my hands more." (pt, re: SwNaaman Plummerisit)               ADULT SLP TREATMENT - 04/10/15 1333    General Information   Behavior/Cognition Alert;Cooperative;Pleasant mood   Treatment Provided   Treatment  provided Cognitive-Linquistic   Pain Assessment   Pain Assessment No/denies pain   Cognitive-Linquistic Treatment   Treatment focused on Aphasia   Skilled Treatment Description tasks today were used to have pt improve verbal expression skills for more efficient communication outside [facility]. In tasks without pictures support, pt req'd written cues for more comprehensive explanation, with mod A required occasionally. With picture support, pt performed better only requiring min A occasionally.   Assessment / Recommendations / Plan   Plan Continue with current plan of care   Progression Toward Goals   Progression toward goals Progressing toward goals            SLP Short Term Goals - 04/05/15 1127    SLP SHORT TERM GOAL #1   Title pt will produce sentence responses in mod complex tasks 80% and rare min A   Time 1   Period Weeks   Status Not Met   SLP SHORT TERM GOAL #2   Title pt will demo HEP for swallowing with rare min A   Status Achieved   SLP SHORT TERM GOAL #3   Title pt will demo compensatory strategies for anomia with occasasional min A   Time 1   Period Weeks   Status Not Met   SLP SHORT TERM GOAL #4   Title Pt's signature/ basic demographic info will not show any  signs of aphasia   Time 1   Period Weeks   Status Partially Met          SLP Long Term Goals - 11-Apr-2015 1414    SLP LONG TERM GOAL #1   Title pt will demo 10 minutes mod complex conversation fucntionally with modified indpendence   Time 4   Period Weeks   Status On-going   SLP LONG TERM GOAL #2   Title pt will demo HEP for swallowing with modified independence   Time 4   Period Weeks   Status On-going   SLP LONG TERM GOAL #3   Title pt will write checks adequately with rare min cues   Time 4   Period Weeks   Status On-going          Plan - 04/11/15 1413    Clinical Impression Statement Pt cont with aphasic errors in his writing, with better awareness of errors today than previously. Cont  ST appropriate to cont to improve verbal expression and functional written expression.   Speech Therapy Frequency 2x / week   Duration 4 weeks   Treatment/Interventions Internal/external aids;Compensatory strategies;SLP instruction and feedback;Patient/family education;Functional tasks;Cueing hierarchy;Pharyngeal strengthening exercises;Oral motor exercises;Compensatory techniques;Diet toleration management by SLP   Potential to Achieve Goals Fair   Potential Considerations Severity of impairments     G-CODES 11-Apr-2015 SLP Language Expression Current- CJ (20-40% impaired) SLP Language Expression Goal- CJ (20-40% impaired)   Speech Therapy Progress Note  Dates of Reporting Period: 02-23-15 to 04-11-15  Objective Reports of Subjective Statement: Pt cont to be seen for verbal expression and to assess safety with POs.  Objective Measurements: Pt's aphasia has improved to the point that mod complex conversation is functional with extra time and minimal restarts/revisions. Pt's diet has been upgraded. No signs/symptoms aspiration PNA to date. SLP to cont to assess for safety. Pt is likely not doing swallowing precautions at home.  Goal Update: See goal section above.  Plan: Pt will cont to be seen for expressive aphasia as well as safety with POs  Reason Skilled Services are Required: Patient will cont to be seen to address expressive aphasia to maximize verbal expression as well as maintain swallowing safety.   Problem List Patient Active Problem List   Diagnosis Date Noted  . Basal ganglia infarction 02/12/2015  . Dizziness   . Expressive aphasia   . Past pointing   . Right leg weakness   . Acute ischemic stroke 02/11/2015  . CVA (cerebral infarction) 02/11/2015  . Aspiration pneumonia 02/11/2015  . Aphasia   . Difficulty speaking   . Speech difficult to understand   . Stroke   . Encounter for antineoplastic chemotherapy 01/31/2015  . Constipation 12/20/2014  . Rash 12/20/2014  .  Cellulitis 12/06/2014  . Peripheral edema 12/06/2014  . Gait disorder 09/05/2014  . Multiple myeloma   . Paraplegia 06/21/2014  . Postoperative anemia due to acute blood loss 06/21/2014  . 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 Spine metastasis 06/17/2014  . Thoracic spine tumor 06/17/2014  . Metastatic bone cancer 06/15/2014    [facility] , MS, [place]  04-11-15, 2:15 PM  [facility] [address] [place], [place], [address] Phone: [phone]   Fax:  [phone]

## 2015-04-11 ENCOUNTER — Other Ambulatory Visit (HOSPITAL_BASED_OUTPATIENT_CLINIC_OR_DEPARTMENT_OTHER): Payer: Medicare Other

## 2015-04-11 ENCOUNTER — Telehealth: Payer: Self-pay | Admitting: Internal Medicine

## 2015-04-11 ENCOUNTER — Ambulatory Visit (HOSPITAL_BASED_OUTPATIENT_CLINIC_OR_DEPARTMENT_OTHER): Payer: Medicare Other | Admitting: Internal Medicine

## 2015-04-11 ENCOUNTER — Encounter: Payer: Self-pay | Admitting: Internal Medicine

## 2015-04-11 ENCOUNTER — Ambulatory Visit (HOSPITAL_BASED_OUTPATIENT_CLINIC_OR_DEPARTMENT_OTHER): Payer: Medicare Other

## 2015-04-11 VITALS — BP 121/52 | HR 67 | Temp 98.5°F | Resp 18 | Ht 70.0 in | Wt 155.8 lb

## 2015-04-11 DIAGNOSIS — C9000 Multiple myeloma not having achieved remission: Secondary | ICD-10-CM | POA: Diagnosis not present

## 2015-04-11 DIAGNOSIS — Z5112 Encounter for antineoplastic immunotherapy: Secondary | ICD-10-CM | POA: Diagnosis present

## 2015-04-11 DIAGNOSIS — C7951 Secondary malignant neoplasm of bone: Secondary | ICD-10-CM

## 2015-04-11 LAB — COMPREHENSIVE METABOLIC PANEL (CC13)
ALT: 17 U/L (ref 0–55)
AST: 15 U/L (ref 5–34)
Albumin: 3.6 g/dL (ref 3.5–5.0)
Alkaline Phosphatase: 53 U/L (ref 40–150)
Anion Gap: 8 mEq/L (ref 3–11)
BUN: 13.6 mg/dL (ref 7.0–26.0)
CALCIUM: 8.8 mg/dL (ref 8.4–10.4)
CHLORIDE: 103 meq/L (ref 98–109)
CO2: 27 meq/L (ref 22–29)
CREATININE: 0.9 mg/dL (ref 0.7–1.3)
EGFR: 80 mL/min/{1.73_m2} — ABNORMAL LOW (ref >90)
Glucose: 109 mg/dl (ref 70–140)
Potassium: 3.8 mEq/L (ref 3.5–5.1)
Sodium: 139 mEq/L (ref 136–145)
Total Bilirubin: 0.76 mg/dL (ref 0.20–1.20)
Total Protein: 5.6 g/dL — ABNORMAL LOW (ref 6.4–8.3)

## 2015-04-11 LAB — CBC WITH DIFFERENTIAL/PLATELET
BASO%: 0.4 % (ref 0.0–2.0)
Basophils Absolute: 0 10*3/uL (ref 0.0–0.1)
EOS%: 7.3 % — AB (ref 0.0–7.0)
Eosinophils Absolute: 0.2 10*3/uL (ref 0.0–0.5)
HEMATOCRIT: 36.3 % — AB (ref 38.4–49.9)
HGB: 12.3 g/dL — ABNORMAL LOW (ref 13.0–17.1)
LYMPH#: 0.4 10*3/uL — AB (ref 0.9–3.3)
LYMPH%: 16.4 % (ref 14.0–49.0)
MCH: 32.8 pg (ref 27.2–33.4)
MCHC: 33.9 g/dL (ref 32.0–36.0)
MCV: 96.8 fL (ref 79.3–98.0)
MONO#: 0.2 10*3/uL (ref 0.1–0.9)
MONO%: 7.8 % (ref 0.0–14.0)
NEUT#: 1.6 10*3/uL (ref 1.5–6.5)
NEUT%: 68.1 % (ref 39.0–75.0)
Platelets: 89 10*3/uL — ABNORMAL LOW (ref 140–400)
RBC: 3.75 10*6/uL — AB (ref 4.20–5.82)
RDW: 16.2 % — ABNORMAL HIGH (ref 11.0–14.6)
WBC: 2.3 10*3/uL — AB (ref 4.0–10.3)

## 2015-04-11 MED ORDER — ONDANSETRON 8 MG PO TBDP
8.0000 mg | ORAL_TABLET | Freq: Once | ORAL | Status: AC
  Administered 2015-04-11: 8 mg via ORAL
  Filled 2015-04-11: qty 1

## 2015-04-11 MED ORDER — ZOLEDRONIC ACID 4 MG/100ML IV SOLN
4.0000 mg | Freq: Once | INTRAVENOUS | Status: AC
  Administered 2015-04-11: 4 mg via INTRAVENOUS
  Filled 2015-04-11: qty 100

## 2015-04-11 MED ORDER — BORTEZOMIB CHEMO SQ INJECTION 3.5 MG (2.5MG/ML)
1.3000 mg/m2 | Freq: Once | INTRAMUSCULAR | Status: AC
  Administered 2015-04-11: 2.5 mg via SUBCUTANEOUS
  Filled 2015-04-11: qty 2.5

## 2015-04-11 MED ORDER — HEPARIN SOD (PORK) LOCK FLUSH 100 UNIT/ML IV SOLN
500.0000 [IU] | Freq: Once | INTRAVENOUS | Status: DC | PRN
  Filled 2015-04-11: qty 5

## 2015-04-11 NOTE — Telephone Encounter (Signed)
Appointments made and avs printed °

## 2015-04-11 NOTE — Patient Instructions (Signed)
[facility] Discharge Instructions for Patients Receiving Chemotherapy  Today you received the following chemotherapy agents: Velcade.  To help prevent nausea and vomiting after your treatment, we encourage you to take your nausea medication: Compazine 10 mg every 6 hours as needed.   If you develop nausea and vomiting that is not controlled by your nausea medication, call the clinic.   BELOW ARE SYMPTOMS THAT SHOULD BE REPORTED IMMEDIATELY:  *FEVER GREATER THAN 100.5 F  *CHILLS WITH OR WITHOUT FEVER  NAUSEA AND VOMITING THAT IS NOT CONTROLLED WITH YOUR NAUSEA MEDICATION  *UNUSUAL SHORTNESS OF BREATH  *UNUSUAL BRUISING OR BLEEDING  TENDERNESS IN MOUTH AND THROAT WITH OR WITHOUT PRESENCE OF ULCERS  *URINARY PROBLEMS  *BOWEL PROBLEMS  UNUSUAL RASH Items with * indicate a potential emergency and should be followed up as soon as possible.  Feel free to call the clinic you have any questions or concerns. The clinic phone number is [phone].  Please show the [facility] at check-in to the Emergency Department and triage nurse.  [facility] Discharge Instructions for Patients Receiving Chemotherapy  Today you received the following chemotherapy agents Velcade, Zometa   To help prevent nausea and vomiting after your treatment, we encourage you to take your nausea medication as directed.    If you develop nausea and vomiting that is not controlled by your nausea medication, call the clinic.   BELOW ARE SYMPTOMS THAT SHOULD BE REPORTED IMMEDIATELY:  *FEVER GREATER THAN 100.5 F  *CHILLS WITH OR WITHOUT FEVER  NAUSEA AND VOMITING THAT IS NOT CONTROLLED WITH YOUR NAUSEA MEDICATION  *UNUSUAL SHORTNESS OF BREATH  *UNUSUAL BRUISING OR BLEEDING  TENDERNESS IN MOUTH AND THROAT WITH OR WITHOUT PRESENCE OF ULCERS  *URINARY PROBLEMS  *BOWEL PROBLEMS  UNUSUAL RASH Items with * indicate a potential emergency and should be followed up as  soon as possible.  Feel free to call the clinic you have any questions or concerns. The clinic phone number is [phone].  Please show the [place] at check-in to the Emergency Department and triage nurse.

## 2015-04-11 NOTE — Progress Notes (Signed)
Verbal consent given by  Dr. Julien Nordmann to treat pt today with current labs

## 2015-04-11 NOTE — Progress Notes (Signed)
[facility] [phone]   Fax:([phone] [place] [address] [employer] [address] [place] [address]  DIAGNOSIS: Multiple myeloma diagnosed in November 2015  PRIOR THERAPY:  1) Status post Thoracic five laminectomy for epidural tumor resection, Thoracic 4-7 posterior lateral arthrodesis,  segmental pedicle screw fixation T4-T7 Globus instrumentation under the care of Dr. Christella Noa on 06/18/2014. 2) Systemic chemotherapy with Velcade 1.3 MG/M2 subcutaneously weekly in addition to Decadron 40 mg by mouth on weekly basis. Status post 11 cycles. 3) palliative radiotherapy to the right proximal humerus, shoulder and scapula under the care of Dr. Lisbeth Renshaw completed on 12/12/2014.  CURRENT THERAPY:  1) Systemic chemotherapy with Velcade 1.3 MG/M2 subcutaneously weekly, Decadron 40 mg by mouth weekly in addition to Revlimid 25 MG by mouth daily for 21 days every 4 weeks, status post 7 cycles. 2) Zometa 4 mg IV every month. First dose 11/01/2014.  INTERVAL HISTORY: Jeremiah Owens 81 y.o. male returns to the clinic today for follow-up visit accompanied by his wife. The patient is currently on treatment with weekly subcutaneous Velcade, Revlimid and Decadron status post 7 cycle and tolerating the treatment fairly well. He denied having any significant nausea or vomiting, no fever or chills. He denied having any significant weight loss or night sweats. He denied having any significant chest pain, shortness of breath, cough or hemoptysis. He has occasional back pain. He is here today for evaluation and repeat blood work as well as receiving Zometa infusion.  MEDICAL HISTORY: Past Medical History  Diagnosis Date  . Compression fracture   . Allergy   . Anxiety   . Thyroid disease   . S/P radiation therapy 08/21/14-09/04/14    T4-6 25Gy/58f  . S/P radiation therapy 11/29/14-12/12/14    rt prox humerus/shoulder/scapula 25Gy/126f .  Encounter for antineoplastic chemotherapy 01/31/2015  . Melanoma   . Bone cancer     t_spine T-5  . Prostate cancer 2002  . Skin cancer 1994    melanoma  right neck   . Multiple myeloma     ALLERGIES:  is allergic to iodine.  MEDICATIONS:  Current Outpatient Prescriptions  Medication Sig Dispense Refill  . acyclovir (ZOVIRAX) 400 MG tablet Take 1 tablet (400 mg total) by mouth 2 (two) times daily. 60 tablet 2  . aspirin 81 MG EC tablet Take 81 mg by mouth daily.  0  . Calcium Carbonate-Vitamin D (CALTRATE 600+D PO) Take 600 mg by mouth 2 (two) times daily.    . cholecalciferol (VITAMIN D) 1000 UNITS tablet Take 1,000 Units by mouth at bedtime.     . Marland Kitchenexamethasone (DECADRON) 4 MG tablet 10 TAB EVERY WEEK, START WITH CHEMO 40 tablet 4  . furosemide (LASIX) 20 MG tablet Take 20 mg by mouth 2 (two) times daily.    . Marland KitchenYDROcodone-acetaminophen (NORCO) 5-325 MG per tablet Take 1 tablet by mouth every 6 (six) hours as needed for moderate pain. 60 tablet 0  . KLOR-CON 10 10 MEQ tablet Take 10 mEq by mouth daily.    . Marland Kitchenenalidomide (REVLIMID) 25 MG capsule Take 1 capsule (25 mg total) by mouth daily. Take one capsule ( 25 mg) by mouth for 21 days every 4 weeks-03/22/15 Authorization number=  [id number] male 21 capsule 0  . levothyroxine (SYNTHROID, LEVOTHROID) 50 MCG tablet Take 50 mcg by mouth daily.  1  . methocarbamol (ROBAXIN) 500 MG tablet TAKE 1 TABLET (500 MG TOTAL) BY MOUTH EVERY  6 (SIX) HOURS AS NEEDED FOR MUSCLE SPASMS. 180 tablet 4  . Multiple Vitamins-Minerals (CENTRUM SILVER PO) Take 1 tablet by mouth daily.    Marland Kitchen omeprazole (PRILOSEC) 40 MG capsule Take 40 mg by mouth daily.    . prochlorperazine (COMPAZINE) 10 MG tablet Take 10 mg by mouth daily.  0  . ranitidine (ZANTAC) 150 MG tablet Take 150 mg by mouth daily as needed for heartburn.   11  . sennosides-docusate sodium (SENOKOT-S) 8.6-50 MG tablet Take 1-2 tablets by mouth 2 (two) times daily as needed for constipation. Takes 1-2  tabs daily    . tamsulosin (FLOMAX) 0.4 MG CAPS capsule Take 1 capsule (0.4 mg total) by mouth at bedtime. 30 capsule 3  . warfarin (COUMADIN) 2 MG tablet Take 1 tablet (2 mg total) by mouth daily. 90 tablet 4   No current facility-administered medications for this visit.    SURGICAL HISTORY:  Past Surgical History  Procedure Laterality Date  . Hernia repair    . Posterior cervical fusion/foraminotomy N/A 06/17/2014    Procedure: Thoracic five laminectomy for epidural tumor resection Thoracic 4-7 posterior lateral arthrodesis, segmental pedicle screw fixation.;  Surgeon: Ashok Pall, MD;  Location: [facility];  Service: Neurosurgery;  Laterality: N/A;    REVIEW OF SYSTEMS:  Constitutional: positive for fatigue Eyes: negative Ears, nose, mouth, throat, and face: negative Respiratory: negative Cardiovascular: negative Gastrointestinal: negative Genitourinary:negative Integument/breast: negative Hematologic/lymphatic: negative Musculoskeletal:positive for bone pain and muscle weakness Neurological: negative Behavioral/Psych: negative Endocrine: negative Allergic/Immunologic: negative   PHYSICAL EXAMINATION: General appearance: alert, cooperative, fatigued and no distress Head: Normocephalic, without obvious abnormality, atraumatic Neck: no adenopathy, no JVD, supple, symmetrical, trachea midline and thyroid not enlarged, symmetric, no tenderness/mass/nodules Lymph nodes: Cervical, supraclavicular, and axillary nodes normal. Resp: clear to auscultation bilaterally Back: symmetric, no curvature. ROM normal. No CVA tenderness. Cardio: regular rate and rhythm, S1, S2 normal, no murmur, click, rub or gallop GI: soft, non-tender; bowel sounds normal; no masses,  no organomegaly Extremities: edema 2+ edema bilaterally with erythema on the legs Neurologic: Alert and oriented X 3, normal strength and tone. Normal symmetric reflexes. Normal coordination and gait Motor: grossly  normal Weakness in the left foot  ECOG PERFORMANCE STATUS: 1 - Symptomatic but completely ambulatory  Blood pressure 121/52, pulse 67, temperature 98.5 F (36.9 C), temperature source Oral, resp. rate 18, height '5\' 10"'  (1.778 m), weight 155 lb 12.8 oz (70.67 kg), SpO2 100 %.  LABORATORY DATA: Lab Results  Component Value Date   WBC 2.3* 04/11/2015   HGB 12.3* 04/11/2015   HCT 36.3* 04/11/2015   MCV 96.8 04/11/2015   PLT 89* 04/11/2015      Chemistry      Component Value Date/Time   NA 139 04/11/2015 0848   NA 134* 02/21/2015 0626   K 3.8 04/11/2015 0848   K 4.1 02/21/2015 0626   CL 101 02/21/2015 0626   CO2 27 04/11/2015 0848   CO2 26 02/21/2015 0626   BUN 13.6 04/11/2015 0848   BUN 12 02/21/2015 0626   CREATININE 0.9 04/11/2015 0848   CREATININE 0.84 02/21/2015 0626      Component Value Date/Time   CALCIUM 8.8 04/11/2015 0848   CALCIUM 7.9* 02/21/2015 0626   ALKPHOS 53 04/11/2015 0848   ALKPHOS 48 02/21/2015 0626   AST 15 04/11/2015 0848   AST 17 02/21/2015 0626   ALT 17 04/11/2015 0848   ALT 25 02/21/2015 0626   BILITOT 0.76 04/11/2015 0848   BILITOT 1.1  02/21/2015 1518       RADIOGRAPHIC STUDIES: No results found.  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 He completed treatment with subcutaneous Velcade and Decadron status post 11 weekly doses. This was discontinued secondary to disease progression. The patient was started on treatment with weekly subcutaneous Velcade, Revlimid for 21 days every 4 weeks in addition to Decadron 40 mg weekly status post 7 cycle and he is tolerating it fairly well.  I recommended for the patient to continue his treatment today as a scheduled. He would come back for follow-up visit in 2 weeks for reevaluation after repeating myeloma panel for evaluation of his  disease. He will proceed with the Zometa infusion today as scheduled. He was advised to call immediately if he has any concerning symptoms in the interval. The patient voices understanding of current disease status and treatment options and is in agreement with the current care plan.  All questions were answered. The patient knows to call the clinic with any problems, questions or concerns. We can certainly see the patient much sooner if necessary.  Disclaimer: This note was dictated with voice recognition software. Similar sounding words can inadvertently be transcribed and may not be corrected upon review.

## 2015-04-12 ENCOUNTER — Other Ambulatory Visit: Payer: Self-pay | Admitting: Internal Medicine

## 2015-04-12 ENCOUNTER — Encounter: Payer: Self-pay | Admitting: Rehabilitation

## 2015-04-12 ENCOUNTER — Ambulatory Visit: Payer: Medicare Other | Admitting: Rehabilitation

## 2015-04-12 ENCOUNTER — Ambulatory Visit: Payer: Medicare Other

## 2015-04-12 DIAGNOSIS — R269 Unspecified abnormalities of gait and mobility: Secondary | ICD-10-CM | POA: Diagnosis not present

## 2015-04-12 DIAGNOSIS — I69359 Hemiplegia and hemiparesis following cerebral infarction affecting unspecified side: Secondary | ICD-10-CM | POA: Diagnosis not present

## 2015-04-12 DIAGNOSIS — R1313 Dysphagia, pharyngeal phase: Secondary | ICD-10-CM

## 2015-04-12 DIAGNOSIS — R29898 Other symptoms and signs involving the musculoskeletal system: Secondary | ICD-10-CM | POA: Diagnosis not present

## 2015-04-12 DIAGNOSIS — R531 Weakness: Secondary | ICD-10-CM

## 2015-04-12 DIAGNOSIS — R4701 Aphasia: Secondary | ICD-10-CM

## 2015-04-12 DIAGNOSIS — R2681 Unsteadiness on feet: Secondary | ICD-10-CM

## 2015-04-12 DIAGNOSIS — M6281 Muscle weakness (generalized): Secondary | ICD-10-CM | POA: Diagnosis not present

## 2015-04-12 NOTE — Therapy (Signed)
[facility] [address] [place], [place], [address] Phone: [phone]   Fax:  [phone]  Speech Language Pathology Treatment  Patient Details  Name: Jeremiah Owens MRN: [id number] Date of Birth: 1930/07/13 Referring Provider:  Wenda Low, MD  Encounter Date: 04/12/2015      End of Session - 04/12/15 1128    Visit Number 11   Number of Visits 16   Date for SLP Re-Evaluation 04/25/15   SLP Start Time 78   SLP Stop Time  1101   SLP Time Calculation (min) 42 min   Activity Tolerance Patient tolerated treatment well      Past Medical History  Diagnosis Date  . Compression fracture   . Allergy   . Anxiety   . Thyroid disease   . S/P radiation therapy 08/21/14-09/04/14    T4-6 25Gy/35f  . S/P radiation therapy 11/29/14-12/12/14    rt prox humerus/shoulder/scapula 25Gy/174f . Encounter for antineoplastic chemotherapy 01/31/2015  . Melanoma   . Bone cancer     t_spine T-5  . Prostate cancer 2002  . Skin cancer 1994    melanoma  right neck   . Multiple myeloma     Past Surgical History  Procedure Laterality Date  . Hernia repair    . Posterior cervical fusion/foraminotomy N/A 06/17/2014    Procedure: Thoracic five laminectomy for epidural tumor resection Thoracic 4-7 posterior lateral arthrodesis, segmental pedicle screw fixation.;  Surgeon: KyAshok PallMD;  [facility];  Service: Neurosurgery;  Laterality: N/A;    There were no vitals filed for this visit.  Visit Diagnosis: Expressive aphasia  Pharyngeal dysphagia      Subjective Assessment - 04/12/15 1028    Subjective Pt completed homework as directed.               ADULT SLP TREATMENT - 04/12/15 1030    General Information   Behavior/Cognition Alert;Cooperative;Pleasant mood   Treatment Provided   Treatment provided Cognitive-Linquistic   Dysphagia Treatment   Other treatment/comments No hydrophonic voice today.     Pain Assessment   Pain Assessment No/denies pain   Cognitive-Linquistic Treatment   Treatment focused on Aphasia   Skilled Treatment Pt with explanation of diagnosis of multiple myeloma with extra time and min restarts - functional. When pt incr'd linguistic demand to complex (explaining the genesis and propogation of multiple myeloma),verbal expression was nonfunctional due to multiple restarts and hesitations.SLP explained to pt how to practice writing - pt concerned his writing is too small. LSP suggested pt do OT exercises (pt stated he has not completed them) and use lined paper as a guide to incr size of writing. SLP facilitated pt's verbal expression skills by providing stimuli requiring complex responses (explaining expressions/proverbs) and pt required max A for thought organization into verbal output. Written cues were most helpful with anomia. Minimal aphasic errors noted in pt responses ("egg" instead of "cake",  "to" instead of "for", etc).   Assessment / Recommendations / Plan   Plan Continue with current plan of care   Progression Toward Goals   Progression toward goals Progressing toward goals            SLP Short Term Goals - 04/12/15 1130    SLP SHORT TERM GOAL #1   Title pt will produce sentence responses in mod complex tasks 80% and rare min A   Time 1   Period Weeks   Status Not Met   SLP SHORT TERM GOAL #2  Title pt will demo HEP for swallowing with rare min A   Status Achieved   SLP SHORT TERM GOAL #3   Title pt will demo compensatory strategies for anomia with occasasional min A   Time 1   Period Weeks   Status Not Met   SLP SHORT TERM GOAL #4   Title Pt's signature/ basic demographic info will not show any signs of aphasia   Time 1   Period Weeks   Status Partially Met          SLP Long Term Goals - 04/12/15 1130    SLP LONG TERM GOAL #1   Title pt will demo 10 minutes mod complex conversation fucntionally with modified indpendence   Time 4    Period Weeks   Status On-going   SLP LONG TERM GOAL #2   Title pt will demo HEP for swallowing with modified independence   Time 4   Period Weeks   Status On-going   SLP LONG TERM GOAL #3   Title pt will write checks adequately with rare min cues   Time 4   Period Weeks   Status On-going          Plan - 04/12/15 1128    Clinical Impression Statement Pt can produce functional verbal expression at mod complex level linguistically, with extra time. Complex verbal info cont as non-functional due to restarts/hesitiations/revisions and anomia. Skilled ST needed to cont for improving verbal expression.   Speech Therapy Frequency 2x / week   Duration 4 weeks   Treatment/Interventions Internal/external aids;Compensatory strategies;SLP instruction and feedback;Patient/family education;Functional tasks;Cueing hierarchy;Pharyngeal strengthening exercises;Oral motor exercises;Compensatory techniques;Diet toleration management by SLP   Potential to Achieve Goals Fair   Potential Considerations Severity of impairments        Problem List Patient Active Problem List   Diagnosis Date Noted  . Basal ganglia infarction 02/12/2015  . Dizziness   . Expressive aphasia   . Past pointing   . Right leg weakness   . Acute ischemic stroke 02/11/2015  . CVA (cerebral infarction) 02/11/2015  . Aspiration pneumonia 02/11/2015  . Aphasia   . Difficulty speaking   . Speech difficult to understand   . Stroke   . Encounter for antineoplastic chemotherapy 01/31/2015  . Constipation 12/20/2014  . Rash 12/20/2014  . Cellulitis 12/06/2014  . Peripheral edema 12/06/2014  . Gait disorder 09/05/2014  . Multiple myeloma   . Paraplegia 06/21/2014  . Postoperative anemia due to acute blood loss 06/21/2014  . 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 Spine metastasis 06/17/2014  . Thoracic spine tumor 06/17/2014  . Metastatic bone cancer 06/15/2014    [facility] , [place], CCC-SLP  04/12/2015, 11:31  AM  [facility] [address] [place], [place], [address] Phone: [phone]   Fax:  [phone]

## 2015-04-12 NOTE — Therapy (Signed)
[facility] [address] [place], [place], [address] Phone: [phone]   Fax:  [phone]  Physical Therapy Treatment  Patient Details  Name: Jeremiah Owens MRN: [id number] Date of Birth: [birth year] Referring Provider:  Wenda Low, MD  Encounter Date: 04/12/2015      PT End of Session - 04/12/15 1204    Visit Number 12   Number of Visits 17   Date for PT Re-Evaluation 04/27/15   Authorization Type KX;  Medicare [employer] - G codes required every 10 visits   PT Start Time 1102   PT Stop Time 1146   PT Time Calculation (min) 44 min   Activity Tolerance Patient tolerated treatment well   Behavior During Therapy WFL for tasks assessed/performed      Past Medical History  Diagnosis Date  . Compression fracture   . Allergy   . Anxiety   . Thyroid disease   . S/P radiation therapy 08/21/14-09/04/14    T4-6 25Gy/71f  . S/P radiation therapy 11/29/14-12/12/14    rt prox humerus/shoulder/scapula 25Gy/173f . Encounter for antineoplastic chemotherapy 01/31/2015  . Melanoma   . Bone cancer     t_spine T-5  . Prostate cancer 2002  . Skin cancer 1994    melanoma  right neck   . Multiple myeloma     Past Surgical History  Procedure Laterality Date  . Hernia repair    . Posterior cervical fusion/foraminotomy N/A 06/17/2014    Procedure: Thoracic five laminectomy for epidural tumor resection Thoracic 4-7 posterior lateral arthrodesis, segmental pedicle screw fixation.;  Surgeon: KyAshok PallMD;  [facility];  Service: Neurosurgery;  Laterality: N/A;    There were no vitals filed for this visit.  Visit Diagnosis:  Abnormality of gait  Unsteadiness  Weakness generalized  Hemiparesis due to recent cerebral infarction      Subjective Assessment - 04/12/15 1109    Subjective Reports walking in house with cane but has been doing this by himself.  Reports difficulty placing cane when standing at  counter preparing something.     Pertinent History Basal ganglia infarct 02/10/15 with expressive aphasia; prostate cancer, skin cancer, multiple myeloma (active; chemotherapy on Wednesdays)   Limitations Walking   Patient Stated Goals "To be able to function without the walker (rollator); better balance."   Currently in Pain? No/denies                         [facility] - 04[facility]12[facility]15 1122    Transfers   Transfers Sit to Stand;Stand to Sit   Sit to Stand 6: Modified independent (Device/Increase time)   Sit to Stand Details (indicate cue type and reason) w/ SPC   Stand to Sit 6: Modified independent (Device/Increase time)   Stand to Sit Details w/ SPC   Stand to Sit Details Pt able to demonstrate decrease use of hand placement and slow controlled descent as he does for HEP.    Ambulation/Gait   Ambulation/Gait Yes   Ambulation/Gait Assistance 4: Min assist;5: Supervision  S for indoor surfaces, min A for outdoors   Ambulation/Gait Assistance Details Performed gait in indoor and outdoor surfaces to further challenge balance with use of SPC.  Ambulated over grass, mulch, gravel, uphill/downhill and up/down curb step.  Requires assist to steady as he tends to drag R foot esp when fatigued.   Also needs cues for placement of SPC during gait outdoors.  Ambulation Distance (Feet) 400 Feet  then another 150' following rest break   Assistive device Straight cane   Gait Pattern Step-through pattern;Decreased dorsiflexion - right;Trunk flexed;Decreased weight shift to left;Right foot flat;Decreased hip/knee flexion - right;Poor foot clearance - right  poor R foot clearance with increased fatigue   Ambulation Surface Level;Indoor;Unlevel;Outdoor;Paved;Gravel;Grass;Other (comment)  mulch   Stairs --   Stairs Assistance --   Number of Stairs --   Height of Stairs --   Ramp --   Curb 4: Min assist   Curb Details (indicate cue type and reason) Min/guard for  outside curb for stability and balance and min A inside due to increaed height.  Cues on proper cane placement.     Gait Comments --   High Level Balance   High Level Balance Activities --   High Level Balance Comments --   Self-Care   Self-Care Other Self-Care Comments   Other Self-Care Comments  Discussed that wife needs to be with him during gait with SPC at home for safety and poor balance.  Pt verbalized understanding, but states wife cannot always be with him.     Neuro Re-ed    Neuro Re-ed Details  Performed balance/NMR while on ramped surface to address ankle and hip strategy and weight shifting.  Performed in semi tandem with weight shifts forward and L lateral then R lateral progressing to head turns side/side and up/down in semi tandem position.  Progressed to standing on ramped surface on mat to increase compliance and balance challenge.  Performed semi tandem (alternating LE placed in front) with S to min A due to posterior and lateral LOB.  Cues for posture throughout.  Added head turns on compliant surface on ramp with min A also needs intermittently to correct LOB.                  PT Education - 04/12/15 1151    Education provided Yes   Education Details Reviewed HEP, continue to educate on ambulating at home with [facility] w/ wife present.    Person(s) Educated Patient   Methods Explanation   Comprehension Verbalized understanding          PT Short Term Goals - 04/05/15 1133    PT SHORT TERM GOAL #1   Title Pt will perform home exercises with mod I using paper handout to indicate safe HEP compliance. Target date: 03/26/15   Baseline Met 8/15.   Status Achieved   PT SHORT TERM GOAL #2   Title Perform Berg Balance Scale to assess stability with functional standing balance.  Target date: 03/26/15   Baseline 8/1: score 29/56   Status Achieved   PT SHORT TERM GOAL #3   Title Pt will decrease TUG time from 21.58 to 17.5 seconds or less to indicate improved efficiency of  functional mobility.  Target date: 03/26/15   Baseline 9/1: 15.94 sec (average of 3 trials) with rollator   Status Achieved   PT SHORT TERM GOAL #4   Title Pt will negotiate standard curb step with mod I using LRAD to indicate increased safety/independence with community mobility.  Target date: 03/26/15   Baseline Met 8/15.   Status Achieved   PT SHORT TERM GOAL #5   Title Pt will ambulate at self-selected gait speed of 2.63 ft/sec to reach status of community ambulator. Target date: 03/26/15   Baseline 8/15: 2.65 ft/sec with rollator   Status Achieved   PT SHORT TERM GOAL #6   Title Pt will  perform sit > stand from standard chair without UE support to indicate increased functional LE strength.  Target date: 03/26/15   Baseline 8/23: sit > stand without UE support with supervision, cueing for full anterior weight shift   Status Partially Met   PT SHORT TERM GOAL #7   Title --   Status --   PT SHORT TERM GOAL #8   Title --   PT SHORT TERM GOAL #9   TITLE --           PT Long Term Goals - 04/10/15 1416    PT LONG TERM GOAL #1   Title Pt/spouse will verbalize understanding of CVA warning signs to indicate understanding of situation in which to seek medical attention. Target date: 04/23/15.   Status On-going   PT LONG TERM GOAL #2   Title Pt will ambulate x200' over indoor, level surfaces with mod I using SPC to indicate increased pt independence with household mobility, progress toward pt-stated goal. Target date: 04/23/15.   Baseline --   Status On-going   PT LONG TERM GOAL #3   Title Pt will perform TUG test in < 13.5 sconds to indicate decreased risk of falling. Target date: 04/23/15.   Baseline 9/1: 15.94 seconds with rollator.   Status On-going   PT LONG TERM GOAL #4   Title Pt will increase Berg Balance Scale score by 6 points from baseline to indicate improvement in functional standing balance. Target date: 04/23/15.   Baseline 8/1: scored 29/56   Status On-going   PT LONG  TERM GOAL #5   Title Pt will ambulate x500' over unlevel, paved surfaces with mod I using single point cane with no overt LOB to indicate increased safety with community mobility, progress toward pt-stated goal of walking with SPC. Target date: 04/23/15.   Baseline --   Status On-going   PT LONG TERM GOAL #6   Title Pt will negotiate 4 stairs with 1 rail with mod I using LRAD to indicate increased independence with community access. Target date: 04/23/15.   Status On-going   PT LONG TERM GOAL #7   Title Pt will negotiate standard ramp and curb step with mod I using SPC to indicate safety traversing community obstacles. Target date: 04/23/15.   Status On-going               Plan - 04/12/15 1206    Clinical Impression Statement Skilled session focused on gait with SPC on outdoor surfaces to further challenge balance and assess safety with task.  Continue to recommend rollator for outdoor surfaces at this time due to need for assist and safety.  Also address balance to focus on ankle and hip strategy on ramped and compliant surface.  Tolerated well.  Continue POC.    Pt will benefit from skilled therapeutic intervention in order to improve on the following deficits Abnormal gait;Decreased balance;Difficulty walking;Decreased strength;Postural dysfunction;Decreased knowledge of use of DME;Decreased activity tolerance;Decreased endurance;Other (comment)   Rehab Potential Good   Clinical Impairments Affecting Rehab Potential Currently undergoing chemotherapy 1x/week   PT Frequency 2x / week   PT Duration 8 weeks   PT Treatment/Interventions ADLs/Self Care Home Management;Gait training;Therapeutic exercise;Patient/family education;Stair training;Balance training;Functional mobility training;Neuromuscular re-education;Manual techniques;Therapeutic activities;DME Instruction;Vestibular;Orthotic Fit/Training   PT Next Visit Plan weight shifting (to the L, grading) w/ balance master, balance on  compliant surface (rocker board),  could address floor transfer.     Consulted and Agree with Plan of Care Patient  Problem List Patient Active Problem List   Diagnosis Date Noted  . Basal ganglia infarction 02/12/2015  . Dizziness   . Expressive aphasia   . Past pointing   . Right leg weakness   . Acute ischemic stroke 02/11/2015  . CVA (cerebral infarction) 02/11/2015  . Aspiration pneumonia 02/11/2015  . Aphasia   . Difficulty speaking   . Speech difficult to understand   . Stroke   . Encounter for antineoplastic chemotherapy 01/31/2015  . Constipation 12/20/2014  . Rash 12/20/2014  . Cellulitis 12/06/2014  . Peripheral edema 12/06/2014  . Gait disorder 09/05/2014  . Multiple myeloma   . Paraplegia 06/21/2014  . Postoperative anemia due to acute blood loss 06/21/2014  . 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 Spine metastasis 06/17/2014  . Thoracic spine tumor 06/17/2014  . Metastatic bone cancer 06/15/2014    Cameron Sprang, PT, MPT [facility] [address] [place], [place], [address] Phone: [phone]   Fax:  [phone] 04/12/2015, 12:11 PM

## 2015-04-17 ENCOUNTER — Ambulatory Visit: Payer: Medicare Other | Admitting: Physical Therapy

## 2015-04-17 ENCOUNTER — Ambulatory Visit: Payer: Medicare Other

## 2015-04-17 DIAGNOSIS — R29898 Other symptoms and signs involving the musculoskeletal system: Secondary | ICD-10-CM | POA: Diagnosis not present

## 2015-04-17 DIAGNOSIS — R269 Unspecified abnormalities of gait and mobility: Secondary | ICD-10-CM | POA: Diagnosis not present

## 2015-04-17 DIAGNOSIS — I69359 Hemiplegia and hemiparesis following cerebral infarction affecting unspecified side: Secondary | ICD-10-CM

## 2015-04-17 DIAGNOSIS — R2681 Unsteadiness on feet: Secondary | ICD-10-CM | POA: Diagnosis not present

## 2015-04-17 DIAGNOSIS — R4701 Aphasia: Secondary | ICD-10-CM

## 2015-04-17 DIAGNOSIS — M6281 Muscle weakness (generalized): Secondary | ICD-10-CM | POA: Diagnosis not present

## 2015-04-17 DIAGNOSIS — R1313 Dysphagia, pharyngeal phase: Secondary | ICD-10-CM

## 2015-04-17 DIAGNOSIS — R531 Weakness: Secondary | ICD-10-CM

## 2015-04-17 NOTE — Therapy (Signed)
[facility] [address] [place], [place], [address] Phone: [phone]   Fax:  [phone]  Physical Therapy Treatment  Patient Details  Name: Jeremiah Owens MRN: [id number] Date of Birth: Aug 26, 1929 Referring Provider:  Wenda Low, MD  Encounter Date: 04/17/2015      PT End of Session - 04/17/15 1812    Visit Number 13   Number of Visits 17   Date for PT Re-Evaluation 04/27/15   Authorization Type KX;  Medicare [employer] - G codes required every 10 visits   PT Start Time 1104   PT Stop Time 1145   PT Time Calculation (min) 41 min   Activity Tolerance Patient tolerated treatment well;Other (comment)  seated rest breaks required due to fatigue   Behavior During Therapy [facility] for tasks assessed/performed      Past Medical History  Diagnosis Date  . Compression fracture   . Allergy   . Anxiety   . Thyroid disease   . S/P radiation therapy 08/21/14-09/04/14    T4-6 25Gy/4f  . S/P radiation therapy 11/29/14-12/12/14    rt prox humerus/shoulder/scapula 25Gy/153f . Encounter for antineoplastic chemotherapy 01/31/2015  . Melanoma   . Bone cancer     t_spine T-5  . Prostate cancer 2002  . Skin cancer 1994    melanoma  right neck   . Multiple myeloma     Past Surgical History  Procedure Laterality Date  . Hernia repair    . Posterior cervical fusion/foraminotomy N/A 06/17/2014    Procedure: Thoracic five laminectomy for epidural tumor resection Thoracic 4-7 posterior lateral arthrodesis, segmental pedicle screw fixation.;  Surgeon: KyAshok PallMD;  [facility];  Service: Neurosurgery;  Laterality: N/A;    There were no vitals filed for this visit.  Visit Diagnosis:  Abnormality of gait  Unsteadiness  Weakness generalized  Hemiparesis due to recent cerebral infarction      Subjective Assessment - 04/17/15 1107    Subjective "The walking with the cane at home is not progressing." Pt  explains that he has difficulty performing dynamic activities around the house using [facility] to [facility]), stating, "I can't reach overhead with my left hand because of shoulder arthiritis; and I  have trouble balancing while reaching with the right hand and holding the cane in the left hand."   Pertinent History Basal ganglia infarct 02/10/15 with expressive aphasia; prostate cancer, skin cancer, multiple myeloma (active; chemotherapy on Wednesdays)   Patient Stated Goals "To be able to function without the walker (rollator); better balance."   Currently in Pain? No/denies                         [facility] Adult PT Treatment/Exercise - 04/17/15 0001    Transfers   Transfers Sit to Stand;Stand to Sit   Sit to Stand 6: Modified independent (Device/Increase time)   Sit to Stand Details (indicate cue type and reason) from mat table without UE support   Stand to Sit 6: Modified independent (Device/Increase time)   Stand to Sit Details to mat table without UE support   Ambulation/Gait   Ambulation/Gait Yes   Ambulation/Gait Assistance 5: Supervision;4: Min guard;4: Min assist   Ambulation/Gait Assistance Details supervision over level, indoor surfaces with SPC; min guard-min A over unlevel paved surfaces outdoors   Ambulation Distance (Feet) 425 Feet  x175' outfoors; x250' indoors   Assistive device Straight cane   Gait Pattern Step-through pattern;Decreased hip/knee  flexion - right;Decreased dorsiflexion - right;Decreased weight shift to left;Right foot flat;Trunk flexed;Poor foot clearance - right  poor R foot clearance with increased fatigue   Ambulation Surface Level;Indoor;Unlevel;Outdoor;Paved   Curb 4: Min assist   Curb Details (indicate cue type and reason) min guard using SPC   Neuro Re-ed    Neuro Re-ed Details  Performed floor transfer x2 trials (seated on mat table <> half kneeling <> tall kneeling <> quadruped) to increase proprioceptive input, increased LE  co-contraction, and improved proximal stability. Pt required min A for initial trial, min guard for subsequent trial. Transitioned to RUE overhead reaching activity involving taking towels from one shelf, sidestepping, and placing  towels in another shelf. Cueing focused on LUE support on countertop, as pt exhibited increased postural instability with overhead reaching without UE support and with SPC in L hand.  Seated rest breaks required between each activity                PT Education - 04/17/15 1314    Education provided Yes   Education Details Recommending LUE support on stable surface (not cane) while performing RUE reaching. Discussed fall recovery, situations in which to call EMS after fall.   Person(s) Educated Patient   Methods Explanation;Demonstration   Comprehension Verbalized understanding;Returned demonstration          PT Short Term Goals - 04/05/15 1133    PT SHORT TERM GOAL #1   Title Pt will perform home exercises with mod I using paper handout to indicate safe HEP compliance. Target date: 03/26/15   Baseline Met 8/15.   Status Achieved   PT SHORT TERM GOAL #2   Title Perform Berg Balance Scale to assess stability with functional standing balance.  Target date: 03/26/15   Baseline 8/1: score 29/56   Status Achieved   PT SHORT TERM GOAL #3   Title Pt will decrease TUG time from 21.58 to 17.5 seconds or less to indicate improved efficiency of functional mobility.  Target date: 03/26/15   Baseline 9/1: 15.94 sec (average of 3 trials) with rollator   Status Achieved   PT SHORT TERM GOAL #4   Title Pt will negotiate standard curb step with mod I using LRAD to indicate increased safety/independence with community mobility.  Target date: 03/26/15   Baseline Met 8/15.   Status Achieved   PT SHORT TERM GOAL #5   Title Pt will ambulate at self-selected gait speed of 2.63 ft/sec to reach status of community ambulator. Target date: 03/26/15   Baseline 8/15: 2.65 ft/sec  with rollator   Status Achieved   PT SHORT TERM GOAL #6   Title Pt will perform sit > stand from standard chair without UE support to indicate increased functional LE strength.  Target date: 03/26/15   Baseline 8/23: sit > stand without UE support with supervision, cueing for full anterior weight shift   Status Partially Met   PT SHORT TERM GOAL #7   Title --   Status --   PT SHORT TERM GOAL #8   Title --   PT SHORT TERM GOAL #9   TITLE --           PT Long Term Goals - 04/10/15 1416    PT LONG TERM GOAL #1   Title Pt/spouse will verbalize understanding of CVA warning signs to indicate understanding of situation in which to seek medical attention. Target date: 04/23/15.   Status On-going   PT LONG TERM GOAL #2   Title  Pt will ambulate x200' over indoor, level surfaces with mod I using SPC to indicate increased pt independence with household mobility, progress toward pt-stated goal. Target date: 04/23/15.   Baseline --   Status On-going   PT LONG TERM GOAL #3   Title Pt will perform TUG test in < 13.5 sconds to indicate decreased risk of falling. Target date: 04/23/15.   Baseline 9/1: 15.94 seconds with rollator.   Status On-going   PT LONG TERM GOAL #4   Title Pt will increase Berg Balance Scale score by 6 points from baseline to indicate improvement in functional standing balance. Target date: 04/23/15.   Baseline 8/1: scored 29/56   Status On-going   PT LONG TERM GOAL #5   Title Pt will ambulate x500' over unlevel, paved surfaces with mod I using single point cane with no overt LOB to indicate increased safety with community mobility, progress toward pt-stated goal of walking with SPC. Target date: 04/23/15.   Baseline --   Status On-going   PT LONG TERM GOAL #6   Title Pt will negotiate 4 stairs with 1 rail with mod I using LRAD to indicate increased independence with community access. Target date: 04/23/15.   Status On-going   PT LONG TERM GOAL #7   Title Pt will negotiate  standard ramp and curb step with mod I using SPC to indicate safety traversing community obstacles. Target date: 04/23/15.   Status On-going               Plan - 04/17/15 1815    Clinical Impression Statement Focused on gait in community environment, floor transfers/fall recovery, and dynamic standing balance while performing functional task. Pt continues to require up to Min A to ambulate over unlevel, paved surfaces with SPC. Required min guard-min A for floor transfer today. Continuing to recommend [facility] for short-distance household ambulation with supervision of wife. Continue per POC.   Pt will benefit from skilled therapeutic intervention in order to improve on the following deficits Abnormal gait;Decreased balance;Difficulty walking;Decreased strength;Postural dysfunction;Decreased knowledge of use of DME;Decreased activity tolerance;Decreased endurance;Other (comment)   Clinical Impairments Affecting Rehab Potential Currently undergoing chemotherapy 1x/week   PT Frequency 2x / week   PT Duration 8 weeks   PT Treatment/Interventions ADLs/Self Care Home Management;Gait training;Therapeutic exercise;Patient/family education;Stair training;Balance training;Functional mobility training;Neuromuscular re-education;Manual techniques;Therapeutic activities;DME Instruction;Vestibular;Orthotic Fit/Training   PT Next Visit Plan balance on compliant surface (rocker board), weight shifting (to the L, grading) w/ balance master   Consulted and Agree with Plan of Care Patient        Problem List Patient Active Problem List   Diagnosis Date Noted  . Basal ganglia infarction 02/12/2015  . Dizziness   . Expressive aphasia   . Past pointing   . Right leg weakness   . Acute ischemic stroke 02/11/2015  . CVA (cerebral infarction) 02/11/2015  . Aspiration pneumonia 02/11/2015  . Aphasia   . Difficulty speaking   . Speech difficult to understand   . Stroke   . Encounter for antineoplastic  chemotherapy 01/31/2015  . Constipation 12/20/2014  . Rash 12/20/2014  . Cellulitis 12/06/2014  . Peripheral edema 12/06/2014  . Gait disorder 09/05/2014  . Multiple myeloma   . Paraplegia 06/21/2014  . Postoperative anemia due to acute blood loss 06/21/2014  . 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 Spine metastasis 06/17/2014  . Thoracic spine tumor 06/17/2014  . Metastatic bone cancer 06/15/2014    Billie Ruddy, PT, DPT [facility]  [address] [place], [place], [address] Phone: [phone]   Fax:  [phone] 04/17/2015, 6:19 PM

## 2015-04-17 NOTE — Therapy (Signed)
[facility] [address] [place], [place], [address] Phone: [phone]   Fax:  [phone]  Speech Language Pathology Treatment  Patient Details  Name: Jeremiah Owens MRN: [id number] Date of Birth: 02/21/1930 Referring Provider:  Wenda Low, MD  Encounter Date: 04/17/2015      End of Session - 04/17/15 1346    Visit Number 12   Number of Visits 16   Date for SLP Re-Evaluation 04/25/15   Authorization Type G-CODE!    Needs renewal week of 04-23-15   SLP Start Time 23   SLP Stop Time  1100   SLP Time Calculation (min) 40 min      Past Medical History  Diagnosis Date  . Compression fracture   . Allergy   . Anxiety   . Thyroid disease   . S/P radiation therapy 08/21/14-09/04/14    T4-6 25Gy/34f  . S/P radiation therapy 11/29/14-12/12/14    rt prox humerus/shoulder/scapula 25Gy/148f . Encounter for antineoplastic chemotherapy 01/31/2015  . Melanoma   . Bone cancer     t_spine T-5  . Prostate cancer 2002  . Skin cancer 1994    melanoma  right neck   . Multiple myeloma     Past Surgical History  Procedure Laterality Date  . Hernia repair    . Posterior cervical fusion/foraminotomy N/A 06/17/2014    Procedure: Thoracic five laminectomy for epidural tumor resection Thoracic 4-7 posterior lateral arthrodesis, segmental pedicle screw fixation.;  Surgeon: KyAshok PallMD;  [facility];  Service: Neurosurgery;  Laterality: N/A;    There were no vitals filed for this visit.  Visit Diagnosis: Expressive aphasia  Pharyngeal dysphagia      Subjective Assessment - 04/17/15 1024    Subjective Pt completed homework as directed.               ADULT SLP TREATMENT - 04/17/15 1024    General Information   Behavior/Cognition Alert;Cooperative;Pleasant mood   Treatment Provided   Treatment provided Cognitive-Linquistic   Dysphagia Treatment   Other treatment/comments no overt s/s aspiration  PNA and denies overt s/s aspiration during meals.   Pain Assessment   Pain Assessment No/denies pain   Cognitive-Linquistic Treatment   Treatment focused on Aphasia   Skilled Treatment Pt described similarities and differences between two items with consistent mod to max assist for word finding/sentence generation. Pt benefitted greatly from specifically verbal cues with word finding    Assessment / Recommendations / Plan   Plan Continue with current plan of care   Progression Toward Goals   Progression toward goals Progressing toward goals            SLP Short Term Goals - 04/17/15 1352    SLP SHORT TERM GOAL #1   Title pt will produce sentence responses in mod complex tasks 80% and rare min A   Status Not Met   SLP SHORT TERM GOAL #2   Title pt will demo HEP for swallowing with rare min A   Status Achieved   SLP SHORT TERM GOAL #3   Title pt will demo compensatory strategies for anomia with occasasional min A   Status Not Met   SLP SHORT TERM GOAL #4   Title Pt's signature/ basic demographic info will not show any signs of aphasia   Status Partially Met          SLP Long Term Goals - 04/17/15 1352    SLP LONG TERM GOAL #1  Title pt will demo 10 minutes mod complex conversation fucntionally with occasional verbal cues   Time 3   Period Weeks   Status Revised   SLP LONG TERM GOAL #2   Title pt will demo HEP for swallowing with modified independence   Time 3   Period Weeks   Status On-going   SLP LONG TERM GOAL #3   Title pt will write checks adequately with rare min cues   Time 3   Period Weeks   Status On-going          Plan - 04/17/15 1349    Clinical Impression Statement Simple conversation is functional but conversation with greater linguistic complexity remains more challenging - complex topics remain nonfunctional. Pt improves production with verbal cues from SLP. Skilled ST remains necessary to improve pt's verbal expression to a level closer to his  baseline. Pt's dysphagia has resolved.   Speech Therapy Frequency 2x / week   Duration --  3 weeks   Treatment/Interventions Internal/external aids;Compensatory strategies;SLP instruction and feedback;Patient/family education;Functional tasks;Cueing hierarchy;Pharyngeal strengthening exercises;Oral motor exercises;Compensatory techniques;Diet toleration management by SLP   Potential to Achieve Goals Fair   Potential Considerations Severity of impairments        Problem List Patient Active Problem List   Diagnosis Date Noted  . Basal ganglia infarction 02/12/2015  . Dizziness   . Expressive aphasia   . Past pointing   . Right leg weakness   . Acute ischemic stroke 02/11/2015  . CVA (cerebral infarction) 02/11/2015  . Aspiration pneumonia 02/11/2015  . Aphasia   . Difficulty speaking   . Speech difficult to understand   . Stroke   . Encounter for antineoplastic chemotherapy 01/31/2015  . Constipation 12/20/2014  . Rash 12/20/2014  . Cellulitis 12/06/2014  . Peripheral edema 12/06/2014  . Gait disorder 09/05/2014  . Multiple myeloma   . Paraplegia 06/21/2014  . Postoperative anemia due to acute blood loss 06/21/2014  . 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 Spine metastasis 06/17/2014  . Thoracic spine tumor 06/17/2014  . Metastatic bone cancer 06/15/2014    [facility] , MS, CCC-SLP   04/17/2015, 1:54 PM  [facility] [address] [place], [place], [address] Phone: [phone]   Fax:  [phone]

## 2015-04-18 ENCOUNTER — Other Ambulatory Visit (HOSPITAL_BASED_OUTPATIENT_CLINIC_OR_DEPARTMENT_OTHER): Payer: Medicare Other

## 2015-04-18 ENCOUNTER — Ambulatory Visit (HOSPITAL_BASED_OUTPATIENT_CLINIC_OR_DEPARTMENT_OTHER): Payer: Medicare Other

## 2015-04-18 VITALS — BP 129/55 | HR 73 | Temp 97.6°F | Resp 24

## 2015-04-18 DIAGNOSIS — D492 Neoplasm of unspecified behavior of bone, soft tissue, and skin: Secondary | ICD-10-CM | POA: Diagnosis not present

## 2015-04-18 DIAGNOSIS — Z5112 Encounter for antineoplastic immunotherapy: Secondary | ICD-10-CM | POA: Diagnosis present

## 2015-04-18 DIAGNOSIS — C9000 Multiple myeloma not having achieved remission: Secondary | ICD-10-CM

## 2015-04-18 LAB — CBC WITH DIFFERENTIAL/PLATELET
BASO%: 0.4 % (ref 0.0–2.0)
Basophils Absolute: 0 10*3/uL (ref 0.0–0.1)
EOS ABS: 0.2 10*3/uL (ref 0.0–0.5)
EOS%: 9 % — ABNORMAL HIGH (ref 0.0–7.0)
HEMATOCRIT: 34.9 % — AB (ref 38.4–49.9)
HGB: 12 g/dL — ABNORMAL LOW (ref 13.0–17.1)
LYMPH%: 15 % (ref 14.0–49.0)
MCH: 33.2 pg (ref 27.2–33.4)
MCHC: 34.4 g/dL (ref 32.0–36.0)
MCV: 96.7 fL (ref 79.3–98.0)
MONO#: 0.4 10*3/uL (ref 0.1–0.9)
MONO%: 15 % — ABNORMAL HIGH (ref 0.0–14.0)
NEUT#: 1.6 10*3/uL (ref 1.5–6.5)
NEUT%: 60.6 % (ref 39.0–75.0)
RBC: 3.61 10*6/uL — ABNORMAL LOW (ref 4.20–5.82)
RDW: 15.9 % — ABNORMAL HIGH (ref 11.0–14.6)
WBC: 2.7 10*3/uL — ABNORMAL LOW (ref 4.0–10.3)
lymph#: 0.4 10*3/uL — ABNORMAL LOW (ref 0.9–3.3)
nRBC: 0 % (ref 0–0)

## 2015-04-18 LAB — COMPREHENSIVE METABOLIC PANEL (CC13)
ALT: 18 U/L (ref 0–55)
ANION GAP: 7 meq/L (ref 3–11)
AST: 15 U/L (ref 5–34)
Albumin: 3.6 g/dL (ref 3.5–5.0)
Alkaline Phosphatase: 56 U/L (ref 40–150)
BUN: 17.5 mg/dL (ref 7.0–26.0)
CALCIUM: 8.8 mg/dL (ref 8.4–10.4)
CHLORIDE: 103 meq/L (ref 98–109)
CO2: 29 mEq/L (ref 22–29)
Creatinine: 0.9 mg/dL (ref 0.7–1.3)
EGFR: 74 mL/min/{1.73_m2} — ABNORMAL LOW (ref >90)
Glucose: 95 mg/dl (ref 70–140)
POTASSIUM: 4 meq/L (ref 3.5–5.1)
Sodium: 140 mEq/L (ref 136–145)
Total Bilirubin: 0.83 mg/dL (ref 0.20–1.20)
Total Protein: 5.5 g/dL — ABNORMAL LOW (ref 6.4–8.3)

## 2015-04-18 LAB — LACTATE DEHYDROGENASE (CC13): LDH: 172 U/L (ref 125–245)

## 2015-04-18 MED ORDER — ONDANSETRON 8 MG PO TBDP
8.0000 mg | ORAL_TABLET | Freq: Once | ORAL | Status: AC
  Administered 2015-04-18: 8 mg via ORAL
  Filled 2015-04-18: qty 1

## 2015-04-18 MED ORDER — ONDANSETRON HCL 8 MG PO TABS
ORAL_TABLET | ORAL | Status: AC
  Filled 2015-04-18: qty 1

## 2015-04-18 MED ORDER — BORTEZOMIB CHEMO SQ INJECTION 3.5 MG (2.5MG/ML)
1.3000 mg/m2 | Freq: Once | INTRAMUSCULAR | Status: AC
  Administered 2015-04-18: 2.5 mg via SUBCUTANEOUS
  Filled 2015-04-18: qty 2.5

## 2015-04-18 NOTE — Patient Instructions (Signed)
[facility] Discharge Instructions for Patients Receiving Chemotherapy  Today you received the following chemotherapy agents Velcade  To help prevent nausea and vomiting after your treatment, we encourage you to take your nausea medication as needed   If you develop nausea and vomiting that is not controlled by your nausea medication, call the clinic.   BELOW ARE SYMPTOMS THAT SHOULD BE REPORTED IMMEDIATELY:  *FEVER GREATER THAN 100.5 F  *CHILLS WITH OR WITHOUT FEVER  NAUSEA AND VOMITING THAT IS NOT CONTROLLED WITH YOUR NAUSEA MEDICATION  *UNUSUAL SHORTNESS OF BREATH  *UNUSUAL BRUISING OR BLEEDING  TENDERNESS IN MOUTH AND THROAT WITH OR WITHOUT PRESENCE OF ULCERS  *URINARY PROBLEMS  *BOWEL PROBLEMS  UNUSUAL RASH Items with * indicate a potential emergency and should be followed up as soon as possible.  Feel free to call the clinic you have any questions or concerns. The clinic phone number is [phone].  Please show the [facility] at check-in to the Emergency Department and triage nurse.

## 2015-04-18 NOTE — Progress Notes (Signed)
OK to treat with today's labs. OK for dental cleaning

## 2015-04-19 ENCOUNTER — Ambulatory Visit: Payer: Medicare Other | Admitting: Rehabilitation

## 2015-04-19 ENCOUNTER — Encounter: Payer: Self-pay | Admitting: Rehabilitation

## 2015-04-19 ENCOUNTER — Ambulatory Visit: Payer: Medicare Other

## 2015-04-19 DIAGNOSIS — R269 Unspecified abnormalities of gait and mobility: Secondary | ICD-10-CM | POA: Diagnosis not present

## 2015-04-19 DIAGNOSIS — R4701 Aphasia: Secondary | ICD-10-CM | POA: Diagnosis not present

## 2015-04-19 DIAGNOSIS — R1313 Dysphagia, pharyngeal phase: Secondary | ICD-10-CM

## 2015-04-19 DIAGNOSIS — R29898 Other symptoms and signs involving the musculoskeletal system: Secondary | ICD-10-CM | POA: Diagnosis not present

## 2015-04-19 DIAGNOSIS — M6281 Muscle weakness (generalized): Secondary | ICD-10-CM | POA: Diagnosis not present

## 2015-04-19 DIAGNOSIS — R262 Difficulty in walking, not elsewhere classified: Secondary | ICD-10-CM

## 2015-04-19 DIAGNOSIS — R2681 Unsteadiness on feet: Secondary | ICD-10-CM | POA: Diagnosis not present

## 2015-04-19 DIAGNOSIS — I69359 Hemiplegia and hemiparesis following cerebral infarction affecting unspecified side: Secondary | ICD-10-CM | POA: Diagnosis not present

## 2015-04-19 NOTE — Patient Instructions (Signed)
  Please complete the assigned speech therapy homework and return it to your next session.  

## 2015-04-19 NOTE — Therapy (Signed)
[facility] [address] [place], [place], [address] Phone: [phone]   Fax:  [phone]  Physical Therapy Treatment  Patient Details  Name: Jeremiah Owens MRN: [id number] Date of Birth: 27-Dec-1929 Referring Provider:  Wenda Low, MD  Encounter Date: 04/19/2015      PT End of Session - 04/19/15 1230    Visit Number 14   Number of Visits 17   Date for PT Re-Evaluation 04/27/15   Authorization Type KX;  Medicare [employer] - G codes required every 10 visits   PT Start Time 1100   PT Stop Time 1142   PT Time Calculation (min) 42 min   Equipment Utilized During Treatment Gait belt   Activity Tolerance Patient tolerated treatment well   Behavior During Therapy WFL for tasks assessed/performed      Past Medical History  Diagnosis Date  . Compression fracture   . Allergy   . Anxiety   . Thyroid disease   . S/P radiation therapy 08/21/14-09/04/14    T4-6 25Gy/63f  . S/P radiation therapy 11/29/14-12/12/14    rt prox humerus/shoulder/scapula 25Gy/173f . Encounter for antineoplastic chemotherapy 01/31/2015  . Melanoma   . Bone cancer     t_spine T-5  . Prostate cancer 2002  . Skin cancer 1994    melanoma  right neck   . Multiple myeloma     Past Surgical History  Procedure Laterality Date  . Hernia repair    . Posterior cervical fusion/foraminotomy N/A 06/17/2014    Procedure: Thoracic five laminectomy for epidural tumor resection Thoracic 4-7 posterior lateral arthrodesis, segmental pedicle screw fixation.;  Surgeon: KyAshok PallMD;  [facility];  Service: Neurosurgery;  Laterality: N/A;    There were no vitals filed for this visit.  Visit Diagnosis:  Difficulty walking  Generalized muscle weakness  Unsteadiness  Hemiparesis due to recent cerebral infarction      Subjective Assessment - 04/19/15 1104    Subjective Pt reports that walking at home and standing at counter while  reaching over head with RUE is getting somewhat easier, but still alternates using rollator in house vs SPC.    Pertinent History Basal ganglia infarct 02/10/15 with expressive aphasia; prostate cancer, skin cancer, multiple myeloma (active; chemotherapy on Wednesdays)   Limitations Walking;House hold activities   Patient Stated Goals "To be able to function without the walker (rollator); better balance."   Currently in Pain? No/denies                         [facility] - 04/19/15 1105    Transfers   Transfers Sit to Stand;Stand to Sit   Sit to Stand 6: Modified independent (Device/Increase time)   Sit to Stand Details (indicate cue type and reason) Requires increased time and effort to complete task.    Stand to Sit 6: Modified independent (Device/Increase time)   Ambulation/Gait   Ambulation/Gait Yes   Ambulation/Gait Assistance 5: Supervision;4: Min guard  variable due to LOB.    Ambulation Distance (Feet) 175 Feet  x175' outfoors; x250' indoors   Assistive device Straight cane   Gait Pattern Step-through pattern;Decreased hip/knee flexion - right;Decreased dorsiflexion - right;Decreased weight shift to left;Right foot flat;Trunk flexed;Poor foot clearance - right  poor R foot clearance with increased fatigue   Ambulation Surface Level;Indoor   Curb --   Gait Comments Performed gait over simulated house hold surfaces, varying from carpet to concrete as  well as through tight spaces.  Pt requires min/guard during negoation through tight spaces.     Neuro Re-ed    Neuro Re-ed Details  Perfomed rocker board activities (large board) in order to address midline postures, balance and ability to adequately weight shift anterior/posterior and R and L.  Note more difficulty with R and L, esp maintaining L weight shift.  Also progressed to marching while on foam pad in order to work on SLS and hip strength.   Tolerated well with light assist for increased L  weight shift.  Ended balance activities with cone tapping while on compliant mat side stepping to next cone to work on SLS and increased weight shift to the L.  Progressed to tipping cone over and setting back up alternating LE to increase time spent in SLS.   Requires min A throughout cone taps to steady for balance and to assist with weight shift.     Exercises   Exercises Other Exercises   Other Exercises  Performed floor recovery at end of session to assess carryover from last visit.  Continues to require min A during transition from side sitting to quadruped and min/guard assist from half kneeling to mat.  Continued cues for safety and when to call EMS.                  PT Education - 04/19/15 1229    Education provided Yes   Education Details Continue to recommend LUE support on stable surface during R UE reaching.  Also discussed fall recovery.    Person(s) Educated Patient   Methods Explanation;Demonstration;Verbal cues   Comprehension Verbalized understanding;Returned demonstration;Need further instruction          PT Short Term Goals - 04/05/15 1133    PT SHORT TERM GOAL #1   Title Pt will perform home exercises with mod I using paper handout to indicate safe HEP compliance. Target date: 03/26/15   Baseline Met 8/15.   Status Achieved   PT SHORT TERM GOAL #2   Title Perform Berg Balance Scale to assess stability with functional standing balance.  Target date: 03/26/15   Baseline 8/1: score 29/56   Status Achieved   PT SHORT TERM GOAL #3   Title Pt will decrease TUG time from 21.58 to 17.5 seconds or less to indicate improved efficiency of functional mobility.  Target date: 03/26/15   Baseline 9/1: 15.94 sec (average of 3 trials) with rollator   Status Achieved   PT SHORT TERM GOAL #4   Title Pt will negotiate standard curb step with mod I using LRAD to indicate increased safety/independence with community mobility.  Target date: 03/26/15   Baseline Met 8/15.   Status  Achieved   PT SHORT TERM GOAL #5   Title Pt will ambulate at self-selected gait speed of 2.63 ft/sec to reach status of community ambulator. Target date: 03/26/15   Baseline 8/15: 2.65 ft/sec with rollator   Status Achieved   PT SHORT TERM GOAL #6   Title Pt will perform sit > stand from standard chair without UE support to indicate increased functional LE strength.  Target date: 03/26/15   Baseline 8/23: sit > stand without UE support with supervision, cueing for full anterior weight shift   Status Partially Met   PT SHORT TERM GOAL #7   Title --   Status --   PT SHORT TERM GOAL #8   Title --   PT SHORT TERM GOAL #9   TITLE --  PT Long Term Goals - 04/10/15 1416    PT LONG TERM GOAL #1   Title Pt/spouse will verbalize understanding of CVA warning signs to indicate understanding of situation in which to seek medical attention. Target date: 04/23/15.   Status On-going   PT LONG TERM GOAL #2   Title Pt will ambulate x200' over indoor, level surfaces with mod I using SPC to indicate increased pt independence with household mobility, progress toward pt-stated goal. Target date: 04/23/15.   Baseline --   Status On-going   PT LONG TERM GOAL #3   Title Pt will perform TUG test in < 13.5 sconds to indicate decreased risk of falling. Target date: 04/23/15.   Baseline 9/1: 15.94 seconds with rollator.   Status On-going   PT LONG TERM GOAL #4   Title Pt will increase Berg Balance Scale score by 6 points from baseline to indicate improvement in functional standing balance. Target date: 04/23/15.   Baseline 8/1: scored 29/56   Status On-going   PT LONG TERM GOAL #5   Title Pt will ambulate x500' over unlevel, paved surfaces with mod I using single point cane with no overt LOB to indicate increased safety with community mobility, progress toward pt-stated goal of walking with SPC. Target date: 04/23/15.   Baseline --   Status On-going   PT LONG TERM GOAL #6   Title Pt will negotiate 4  stairs with 1 rail with mod I using LRAD to indicate increased independence with community access. Target date: 04/23/15.   Status On-going   PT LONG TERM GOAL #7   Title Pt will negotiate standard ramp and curb step with mod I using SPC to indicate safety traversing community obstacles. Target date: 04/23/15.   Status On-going               Plan - 04/19/15 1232    Clinical Impression Statement Skilled session focused on balance on compliant surface, forward/backwards and lateral weight shifting, and also floor recovery from last session.  Continues to need min A and min cues for when to call EMS.  Continue POC.    Pt will benefit from skilled therapeutic intervention in order to improve on the following deficits Abnormal gait;Decreased balance;Difficulty walking;Decreased strength;Postural dysfunction;Decreased knowledge of use of DME;Decreased activity tolerance;Decreased endurance;Other (comment)   Rehab Potential Good   Clinical Impairments Affecting Rehab Potential Currently undergoing chemotherapy 1x/week   PT Frequency 2x / week   PT Duration 8 weeks   PT Treatment/Interventions ADLs/Self Care Home Management;Gait training;Therapeutic exercise;Patient/family education;Stair training;Balance training;Functional mobility training;Neuromuscular re-education;Manual techniques;Therapeutic activities;DME Instruction;Vestibular;Orthotic Fit/Training   PT Next Visit Plan balance on compliant surface (corner tasks), weight shifting, esp to the L, can use balance master.    Consulted and Agree with Plan of Care Patient        Problem List Patient Active Problem List   Diagnosis Date Noted  . Basal ganglia infarction 02/12/2015  . Dizziness   . Expressive aphasia   . Past pointing   . Right leg weakness   . Acute ischemic stroke 02/11/2015  . CVA (cerebral infarction) 02/11/2015  . Aspiration pneumonia 02/11/2015  . Aphasia   . Difficulty speaking   . Speech difficult to  understand   . Stroke   . Encounter for antineoplastic chemotherapy 01/31/2015  . Constipation 12/20/2014  . Rash 12/20/2014  . Cellulitis 12/06/2014  . Peripheral edema 12/06/2014  . Gait disorder 09/05/2014  . Multiple myeloma   . Paraplegia 06/21/2014  . Postoperative  anemia due to acute blood loss 06/21/2014  . 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 Spine metastasis 06/17/2014  . Thoracic spine tumor 06/17/2014  . Metastatic bone cancer 06/15/2014   Cameron Sprang, PT, MPT [facility] [address] [place], [place], [address] Phone: [phone]   Fax:  [phone] 04/19/2015, 12:37 PM

## 2015-04-19 NOTE — Therapy (Signed)
[facility] [address] [place], [place], [address] Phone: [phone]   Fax:  [phone]  Speech Language Pathology Treatment  Patient Details  Name: Jeremiah Owens MRN: [id number] Date of Birth: [birth year] Referring Provider:  Wenda Low, MD  Encounter Date: 04/19/2015      End of Session - 04/19/15 1056    Visit Number 13   Number of Visits 16   Date for SLP Re-Evaluation 04/25/15   Authorization Type G-CODE!    Needs renewal week of 04-23-15   SLP Start Time 21   SLP Stop Time  1100   SLP Time Calculation (min) 40 min   Activity Tolerance Patient tolerated treatment well      Past Medical History  Diagnosis Date  . Compression fracture   . Allergy   . Anxiety   . Thyroid disease   . S/P radiation therapy 08/21/14-09/04/14    T4-6 25Gy/44f  . S/P radiation therapy 11/29/14-12/12/14    rt prox humerus/shoulder/scapula 25Gy/114f . Encounter for antineoplastic chemotherapy 01/31/2015  . Melanoma   . Bone cancer     t_spine T-5  . Prostate cancer 2002  . Skin cancer 1994    melanoma  right neck   . Multiple myeloma     Past Surgical History  Procedure Laterality Date  . Hernia repair    . Posterior cervical fusion/foraminotomy N/A 06/17/2014    Procedure: Thoracic five laminectomy for epidural tumor resection Thoracic 4-7 posterior lateral arthrodesis, segmental pedicle screw fixation.;  Surgeon: KyAshok PallMD;  [facility];  Service: Neurosurgery;  Laterality: N/A;    There were no vitals filed for this visit.  Visit Diagnosis: Expressive aphasia  Pharyngeal dysphagia      Subjective Assessment - 04/19/15 1026    Subjective Pt completed homework as directed.               ADULT SLP TREATMENT - 04/19/15 1026    General Information   Behavior/Cognition Alert;Cooperative;Pleasant mood   Treatment Provided   Treatment provided Cognitive-Linquistic   Dysphagia  Treatment   Other treatment/comments no overt s/s aspiration PNA oboserved today nor any reported   Pain Assessment   Pain Assessment No/denies pain   Cognitive-Linquistic Treatment   Treatment focused on Aphasia   Skilled Treatment SLP reviewed pt homework with him  to assess accuracy of language skills. Pt wrote down steps involved in teh task rather than items needed 20% of the time. SLP ID'd pt's deficit with linguistic relationships and had pt think of all words related to certain topics ([place], football game, etc). Pt req'd usual mod to max A for generating words.    Assessment / Recommendations / Plan   Plan Continue with current plan of care   Progression Toward Goals   Progression toward goals Progressing toward goals            SLP Short Term Goals - 04/17/15 1352    SLP SHORT TERM GOAL #1   Title pt will produce sentence responses in mod complex tasks 80% and rare min A   Status Not Met   SLP SHORT TERM GOAL #2   Title pt will demo HEP for swallowing with rare min A   Status Achieved   SLP SHORT TERM GOAL #3   Title pt will demo compensatory strategies for anomia with occasasional min A   Status Not Met   SLP SHORT TERM GOAL #4   Title Pt's signature/ basic  demographic info will not show any signs of aphasia   Status Partially Met          SLP Long Term Goals - 04/19/15 1057    SLP LONG TERM GOAL #1   Title pt will demo 10 minutes mod complex conversation fucntionally with occasional verbal cues   Time 3   Period Weeks   Status Revised   SLP LONG TERM GOAL #2   Title pt will demo HEP for swallowing with modified independence   Time 3   Period Weeks   Status On-going   SLP LONG TERM GOAL #3   Title pt will write checks adequately with rare min cues   Time 3   Period Weeks   Status On-going          Plan - 04/19/15 1056    Clinical Impression Statement Pt ability with linguistic connections is deficient and SLP provided homework to improve  these skills. Skilled ST necessary to cont to improve pt's verbal expression.   Speech Therapy Frequency 2x / week   Duration 2 weeks   Treatment/Interventions Internal/external aids;Compensatory strategies;SLP instruction and feedback;Patient/family education;Functional tasks;Cueing hierarchy;Pharyngeal strengthening exercises;Oral motor exercises;Compensatory techniques;Diet toleration management by SLP   Potential to Achieve Goals Fair   Potential Considerations Severity of impairments        Problem List Patient Active Problem List   Diagnosis Date Noted  . Basal ganglia infarction 02/12/2015  . Dizziness   . Expressive aphasia   . Past pointing   . Right leg weakness   . Acute ischemic stroke 02/11/2015  . CVA (cerebral infarction) 02/11/2015  . Aspiration pneumonia 02/11/2015  . Aphasia   . Difficulty speaking   . Speech difficult to understand   . Stroke   . Encounter for antineoplastic chemotherapy 01/31/2015  . Constipation 12/20/2014  . Rash 12/20/2014  . Cellulitis 12/06/2014  . Peripheral edema 12/06/2014  . Gait disorder 09/05/2014  . Multiple myeloma   . Paraplegia 06/21/2014  . Postoperative anemia due to acute blood loss 06/21/2014  . 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 Spine metastasis 06/17/2014  . Thoracic spine tumor 06/17/2014  . Metastatic bone cancer 06/15/2014    [facility] , MS, CCC-SLP  04/19/2015, 10:59 AM  [facility] [address] [place], [place], [address] Phone: [phone]   Fax:  [phone]

## 2015-04-20 ENCOUNTER — Other Ambulatory Visit: Payer: Self-pay | Admitting: Medical Oncology

## 2015-04-20 DIAGNOSIS — C9000 Multiple myeloma not having achieved remission: Secondary | ICD-10-CM

## 2015-04-20 LAB — IGG, IGA, IGM
IGA: 63 mg/dL — AB (ref 68–379)
IGG (IMMUNOGLOBIN G), SERUM: 407 mg/dL — AB (ref 650–1600)
IGM, SERUM: 19 mg/dL — AB (ref 41–251)

## 2015-04-20 LAB — KAPPA/LAMBDA LIGHT CHAINS
KAPPA FREE LGHT CHN: 1.99 mg/dL — AB (ref 0.33–1.94)
KAPPA LAMBDA RATIO: 1.6 (ref 0.26–1.65)
LAMBDA FREE LGHT CHN: 1.24 mg/dL (ref 0.57–2.63)

## 2015-04-20 LAB — BETA 2 MICROGLOBULIN, SERUM: BETA 2 MICROGLOBULIN: 3.18 mg/L — AB (ref <=2.51)

## 2015-04-20 MED ORDER — LENALIDOMIDE 25 MG PO CAPS: 25.0000 mg | ORAL_CAPSULE | Freq: Every day | ORAL | Status: DC

## 2015-04-20 NOTE — Progress Notes (Signed)
Faxed [facility] to [facility]

## 2015-04-24 ENCOUNTER — Ambulatory Visit: Payer: Medicare Other | Admitting: Physical Therapy

## 2015-04-24 ENCOUNTER — Ambulatory Visit: Payer: Medicare Other

## 2015-04-24 DIAGNOSIS — R269 Unspecified abnormalities of gait and mobility: Secondary | ICD-10-CM

## 2015-04-24 DIAGNOSIS — R4701 Aphasia: Secondary | ICD-10-CM | POA: Diagnosis not present

## 2015-04-24 DIAGNOSIS — R29898 Other symptoms and signs involving the musculoskeletal system: Secondary | ICD-10-CM | POA: Diagnosis not present

## 2015-04-24 DIAGNOSIS — I69359 Hemiplegia and hemiparesis following cerebral infarction affecting unspecified side: Secondary | ICD-10-CM

## 2015-04-24 DIAGNOSIS — R279 Unspecified lack of coordination: Secondary | ICD-10-CM

## 2015-04-24 DIAGNOSIS — R2681 Unsteadiness on feet: Secondary | ICD-10-CM | POA: Diagnosis not present

## 2015-04-24 DIAGNOSIS — R1313 Dysphagia, pharyngeal phase: Secondary | ICD-10-CM

## 2015-04-24 DIAGNOSIS — R531 Weakness: Secondary | ICD-10-CM

## 2015-04-24 DIAGNOSIS — M6281 Muscle weakness (generalized): Secondary | ICD-10-CM | POA: Diagnosis not present

## 2015-04-24 NOTE — Therapy (Signed)
[facility] [address] [place], [place], [address] Phone: [phone]   Fax:  [phone]  Speech Language Pathology Treatment  Patient Details  Name: Jeremiah Owens MRN: [id number] Date of Birth: [birth year] Referring Provider:  Wenda Low, MD  Encounter Date: 04/24/2015      End of Session - 04/24/15 1403    Visit Number 14   Number of Visits 16   Date for SLP Re-Evaluation 04/25/15   Authorization Type G-CODE!    Needs renewal week of 04-23-15   SLP Start Time 1443   SLP Stop Time  1401   SLP Time Calculation (min) 44 min   Activity Tolerance Patient tolerated treatment well      Past Medical History  Diagnosis Date  . Compression fracture   . Allergy   . Anxiety   . Thyroid disease   . S/P radiation therapy 08/21/14-09/04/14    T4-6 25Gy/35f  . S/P radiation therapy 11/29/14-12/12/14    rt prox humerus/shoulder/scapula 25Gy/111f . Encounter for antineoplastic chemotherapy 01/31/2015  . Melanoma   . Bone cancer     t_spine T-5  . Prostate cancer 2002  . Skin cancer 1994    melanoma  right neck   . Multiple myeloma     Past Surgical History  Procedure Laterality Date  . Hernia repair    . Posterior cervical fusion/foraminotomy N/A 06/17/2014    Procedure: Thoracic five laminectomy for epidural tumor resection Thoracic 4-7 posterior lateral arthrodesis, segmental pedicle screw fixation.;  Surgeon: KyAshok PallMD;  [facility];  Service: Neurosurgery;  Laterality: N/A;    There were no vitals filed for this visit.  Visit Diagnosis: Expressive aphasia  Pharyngeal dysphagia      Subjective Assessment - 04/24/15 1320    Subjective Pt appears in cheerful mood. completed homework as directed.               ADULT SLP TREATMENT - 04/24/15 1321    General Information   Behavior/Cognition Alert;Cooperative;Pleasant mood   Treatment Provided   Treatment provided  Cognitive-Linquistic   Dysphagia Treatment   Other treatment/comments pt denies overt s/s aspiration PNA  None observed today by SLP.   Pain Assessment   Pain Assessment No/denies pain   Cognitive-Linquistic Treatment   Treatment focused on Aphasia   Skilled Treatment Divergent naming tasks with concrete simple categories - average 3 items named prior to cues necessary. In word relationship tasks, mod-max A needed usually.    Assessment / Recommendations / Plan   Plan Continue with current plan of care   Progression Toward Goals   Progression toward goals Not progressing toward goals (comment)            SLP Short Term Goals - 04/17/15 1352    SLP SHORT TERM GOAL #1   Title pt will produce sentence responses in mod complex tasks 80% and rare min A   Status Not Met   SLP SHORT TERM GOAL #2   Title pt will demo HEP for swallowing with rare min A   Status Achieved   SLP SHORT TERM GOAL #3   Title pt will demo compensatory strategies for anomia with occasasional min A   Status Not Met   SLP SHORT TERM GOAL #4   Title Pt's signature/ basic demographic info will not show any signs of aphasia   Status Partially Met          SLP Long Term Goals - 04/19/15 1057  SLP LONG TERM GOAL #1   Title pt will demo 10 minutes mod complex conversation fucntionally with occasional verbal cues   Time 3   Period Weeks   Status Revised   SLP LONG TERM GOAL #2   Title pt will demo HEP for swallowing with modified independence   Time 3   Period Weeks   Status On-going   SLP LONG TERM GOAL #3   Title pt will write checks adequately with rare min cues   Time 3   Period Weeks   Status On-going          Plan - 04/24/15 1403    Clinical Impression Statement Pt ability with linguistic connections is deficient and SLP provided homework to improve these skills. Skilled ST necessary to cont to improve pt's verbal expression.   Speech Therapy Frequency 2x / week   Duration 1 week    Treatment/Interventions Internal/external aids;Compensatory strategies;SLP instruction and feedback;Patient/family education;Functional tasks;Cueing hierarchy;Pharyngeal strengthening exercises;Oral motor exercises;Compensatory techniques;Diet toleration management by SLP   Potential to Achieve Goals Fair   Potential Considerations Severity of impairments        Problem List Patient Active Problem List   Diagnosis Date Noted  . Basal ganglia infarction 02/12/2015  . Dizziness   . Expressive aphasia   . Past pointing   . Right leg weakness   . Acute ischemic stroke 02/11/2015  . CVA (cerebral infarction) 02/11/2015  . Aspiration pneumonia 02/11/2015  . Aphasia   . Difficulty speaking   . Speech difficult to understand   . Stroke   . Encounter for antineoplastic chemotherapy 01/31/2015  . Constipation 12/20/2014  . Rash 12/20/2014  . Cellulitis 12/06/2014  . Peripheral edema 12/06/2014  . Gait disorder 09/05/2014  . Multiple myeloma   . Paraplegia 06/21/2014  . Postoperative anemia due to acute blood loss 06/21/2014  . 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 Spine metastasis 06/17/2014  . Thoracic spine tumor 06/17/2014  . Metastatic bone cancer 06/15/2014    [facility] , [place], CCC-SLP  04/24/2015, 2:04 PM  [facility] [address] [place], [place], [address] Phone: [phone]   Fax:  [phone]

## 2015-04-24 NOTE — Therapy (Signed)
[facility] [address] [place], [place], [address] Phone: [phone]   Fax:  [phone]  Physical Therapy Treatment  Patient Details  Name: Jeremiah Owens MRN: [id number] Date of Birth: [birth year] Referring Provider:  Wenda Low, MD  Encounter Date: 04/24/2015      PT End of Session - 04/24/15 1859    Visit Number 15   Number of Visits 17   Date for PT Re-Evaluation 04/27/15   Authorization Type KX;  Medicare [employer] - G codes required every 10 visits   PT Start Time 1403   PT Stop Time 1445   PT Time Calculation (min) 42 min   Activity Tolerance Patient tolerated treatment well   Behavior During Therapy WFL for tasks assessed/performed      Past Medical History  Diagnosis Date  . Compression fracture   . Allergy   . Anxiety   . Thyroid disease   . S/P radiation therapy 08/21/14-09/04/14    T4-6 25Gy/41f  . S/P radiation therapy 11/29/14-12/12/14    rt prox humerus/shoulder/scapula 25Gy/1100f . Encounter for antineoplastic chemotherapy 01/31/2015  . Melanoma   . Bone cancer     t_spine T-5  . Prostate cancer 2002  . Skin cancer 1994    melanoma  right neck   . Multiple myeloma     Past Surgical History  Procedure Laterality Date  . Hernia repair    . Posterior cervical fusion/foraminotomy N/A 06/17/2014    Procedure: Thoracic five laminectomy for epidural tumor resection Thoracic 4-7 posterior lateral arthrodesis, segmental pedicle screw fixation.;  Surgeon: KyAshok PallMD;  [facility];  Service: Neurosurgery;  Laterality: N/A;    There were no vitals filed for this visit.  Visit Diagnosis:  Abnormality of gait  Hemiparesis due to recent cerebral infarction  Weakness generalized  Lack of coordination      Subjective Assessment - 04/24/15 1405    Subjective Pt denies pain. Reports no falls. Pt reports using cane in home, "but not as often or as well as I want to."     Pertinent History Basal ganglia infarct 02/10/15 with expressive aphasia; prostate cancer, skin cancer, multiple myeloma (active; chemotherapy on Wednesdays)   Limitations Walking;House hold activities   Patient Stated Goals "To be able to function without the walker (rollator); better balance."   Currently in Pain? No/denies                         [facility] Adult PT Treatment/Exercise - 04/24/15 0001    Transfers   Transfers Sit to Stand;Stand to Sit   Sit to Stand 6: Modified independent (Device/Increase time);5: Supervision   Sit to Stand Details (indicate cue type and reason) for sit > stand without UE support, requires several attempts prior to standing without UE use   Stand to Sit 6: Modified independent (Device/Increase time)   Ambulation/Gait   Ambulation/Gait Yes   Ambulation/Gait Assistance 5: Supervision;4: Min guard  variable due to LOB.    Ambulation/Gait Assistance Details cueing focused on increasing B hip/knee flexion during advancement of respective LE; cueing for B heel strike.   Ambulation Distance (Feet) 450 Feet  x200' outdoors; remainder indoors   Assistive device Straight cane   Gait Pattern Step-through pattern;Decreased hip/knee flexion - right;Decreased dorsiflexion - right;Decreased weight shift to left;Right foot flat;Trunk flexed;Poor foot clearance - right  poor R foot clearance with increased fatigue   Ambulation Surface Level;Unlevel;Indoor;Outdoor;Paved   Door  Management 4: Min assist   Door Managment Details (indicate cue type and reason) with SPC   Curb 5: Supervision   Curb Details (indicate cue type and reason) Cueing for sequencing during descent   [employer]   Sit to Stand Able to stand  independently using hands   Standing Unsupported Able to stand safely 2 minutes   Sitting with Back Unsupported but Feet Supported on Floor or Stool Able to sit safely and securely 2 minutes   Stand to Sit Sits safely with minimal use of hands    Transfers Able to transfer safely, definite need of hands   Standing Unsupported with Eyes Closed Able to stand 10 seconds safely   Standing Ubsupported with Feet Together Able to place feet together independently and stand for 1 minute with supervision   From Standing, Reach Forward with Outstretched Arm Can reach confidently >25 cm (10")   From Standing Position, Pick up Object from [place] to pick up shoe safely and easily   From Standing Position, Turn to Look Behind Over each Shoulder Looks behind one side only/other side shows less weight shift   Turn 360 Degrees Able to turn 360 degrees safely but slowly   Standing Unsupported, Alternately Place Feet on Step/Stool Able to complete 4 steps without aid or supervision   Standing Unsupported, One Foot in Front Able to plae foot ahead of the other independently and hold 30 seconds   Standing on One Leg Tries to lift leg/unable to hold 3 seconds but remains standing independently   Total Score 44   Neuro Re-ed    Neuro Re-ed Details  Completed Berg with score of 44/56; see Merrilee Jansky for detailed findings.   Exercises   Exercises --   Other Exercises  --                PT Education - 04/24/15 1854    Education provided Yes   Education Details Merrilee Jansky findings, progress, fall risk.   Person(s) Educated Patient   Methods Explanation   Comprehension Verbalized understanding          PT Short Term Goals - 04/24/15 1427    PT SHORT TERM GOAL #1   Title Pt will perform home exercises with mod I using paper handout to indicate safe HEP compliance. Target date: 03/26/15   Baseline Met 8/15.   Status Achieved   PT SHORT TERM GOAL #2   Title Perform Berg Balance Scale to assess stability with functional standing balance.  Target date: 03/26/15   Baseline 8/1: score 29/56   Status Achieved   PT SHORT TERM GOAL #3   Title Pt will decrease TUG time from 21.58 to 17.5 seconds or less to indicate improved efficiency of functional  mobility.  Target date: 03/26/15   Baseline 9/1: 15.94 sec (average of 3 trials) with rollator   Status Achieved   PT SHORT TERM GOAL #4   Title Pt will negotiate standard curb step with mod I using LRAD to indicate increased safety/independence with community mobility.  Target date: 03/26/15   Baseline Met 8/15.   Status Achieved   PT SHORT TERM GOAL #5   Title Pt will ambulate at self-selected gait speed of 2.63 ft/sec to reach status of community ambulator. Target date: 03/26/15   Baseline 8/15: 2.65 ft/sec with rollator   Status Achieved   PT SHORT TERM GOAL #6   Title Pt will perform sit > stand from standard chair without UE support to indicate  increased functional LE strength.  Target date: 03/26/15   Baseline 8/23: sit > stand without UE support with supervision, cueing for full anterior weight shift   Status Partially Met           PT Long Term Goals - 04/24/15 1427    PT LONG TERM GOAL #1   Title Pt/spouse will verbalize understanding of CVA warning signs to indicate understanding of situation in which to seek medical attention. Target date: 04/23/15.   Status On-going   PT LONG TERM GOAL #2   Title Pt will ambulate x200' over indoor, level surfaces with mod I using SPC to indicate increased pt independence with household mobility, progress toward pt-stated goal. Target date: 04/23/15.   Status On-going   PT LONG TERM GOAL #3   Title Pt will perform TUG test in < 13.5 sconds to indicate decreased risk of falling. Target date: 04/23/15.   Baseline 9/1: 15.94 seconds with rollator.   Status On-going   PT LONG TERM GOAL #4   Title Pt will increase Berg Balance Scale score by 6 points from baseline to indicate improvement in functional standing balance. Target date: 04/23/15.   Baseline 8/1: scored 29/56 ;   9/20: score 44/56   Status Achieved   PT LONG TERM GOAL #5   Title Pt will ambulate x500' over unlevel, paved surfaces with mod I using single point cane with no overt LOB to  indicate increased safety with community mobility, progress toward pt-stated goal of walking with SPC. Target date: 04/23/15.   Status On-going   PT LONG TERM GOAL #6   Title Pt will negotiate 4 stairs with 1 rail with mod I using LRAD to indicate increased independence with community access. Target date: 04/23/15.   Status On-going   PT LONG TERM GOAL #7   Title Pt will negotiate standard ramp and curb step with mod I using SPC to indicate safety traversing community obstacles. Target date: 04/23/15.   Status On-going               Plan - 04/24/15 1901    Clinical Impression Statement Pt demonstrates improved functional standing balance, as exhibited by Merrilee Jansky score increase from 29/56 to 44/56 since PT evaluation. Continue per POC.   Pt will benefit from skilled therapeutic intervention in order to improve on the following deficits Abnormal gait;Decreased balance;Difficulty walking;Decreased strength;Postural dysfunction;Decreased knowledge of use of DME;Decreased activity tolerance;Decreased endurance   Rehab Potential Good   Clinical Impairments Affecting Rehab Potential Currently undergoing chemotherapy 1x/week   PT Frequency 2x / week   PT Duration 8 weeks   PT Next Visit Plan Finish checking LTG's and recert. Discussed 2x/week for up to 4 more weeks and pt in agreement. Progress HEP.   Consulted and Agree with Plan of Care Patient        Problem List Patient Active Problem List   Diagnosis Date Noted  . Basal ganglia infarction 02/12/2015  . Dizziness   . Expressive aphasia   . Past pointing   . Right leg weakness   . Acute ischemic stroke 02/11/2015  . CVA (cerebral infarction) 02/11/2015  . Aspiration pneumonia 02/11/2015  . Aphasia   . Difficulty speaking   . Speech difficult to understand   . Stroke   . Encounter for antineoplastic chemotherapy 01/31/2015  . Constipation 12/20/2014  . Rash 12/20/2014  . Cellulitis 12/06/2014  . Peripheral edema 12/06/2014  .  Gait disorder 09/05/2014  . Multiple myeloma   . Paraplegia  06/21/2014  . Postoperative anemia due to acute blood loss 06/21/2014  . 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 Spine metastasis 06/17/2014  . Thoracic spine tumor 06/17/2014  . Metastatic bone cancer 06/15/2014    Billie Ruddy, PT, DPT [facility] [address] [place], [place], [address] Phone: [phone]   Fax:  [phone] 04/24/2015, 7:07 PM

## 2015-04-24 NOTE — Patient Instructions (Signed)
  Please complete the assigned speech therapy homework, at least 20 minutes twice a day, more if desired.

## 2015-04-25 ENCOUNTER — Ambulatory Visit (HOSPITAL_BASED_OUTPATIENT_CLINIC_OR_DEPARTMENT_OTHER): Payer: Medicare Other

## 2015-04-25 ENCOUNTER — Encounter: Payer: Self-pay | Admitting: Nurse Practitioner

## 2015-04-25 ENCOUNTER — Ambulatory Visit (HOSPITAL_BASED_OUTPATIENT_CLINIC_OR_DEPARTMENT_OTHER): Payer: Medicare Other | Admitting: Nurse Practitioner

## 2015-04-25 ENCOUNTER — Other Ambulatory Visit (HOSPITAL_BASED_OUTPATIENT_CLINIC_OR_DEPARTMENT_OTHER): Payer: Medicare Other

## 2015-04-25 VITALS — BP 120/49 | HR 70 | Temp 97.7°F | Resp 17 | Ht 70.0 in | Wt 157.6 lb

## 2015-04-25 DIAGNOSIS — Z7901 Long term (current) use of anticoagulants: Secondary | ICD-10-CM

## 2015-04-25 DIAGNOSIS — C9000 Multiple myeloma not having achieved remission: Secondary | ICD-10-CM | POA: Diagnosis not present

## 2015-04-25 DIAGNOSIS — Z5112 Encounter for antineoplastic immunotherapy: Secondary | ICD-10-CM

## 2015-04-25 DIAGNOSIS — R197 Diarrhea, unspecified: Secondary | ICD-10-CM

## 2015-04-25 DIAGNOSIS — I639 Cerebral infarction, unspecified: Secondary | ICD-10-CM | POA: Diagnosis not present

## 2015-04-25 DIAGNOSIS — G893 Neoplasm related pain (acute) (chronic): Secondary | ICD-10-CM | POA: Diagnosis not present

## 2015-04-25 LAB — COMPREHENSIVE METABOLIC PANEL (CC13)
ALT: 23 U/L (ref 0–55)
AST: 16 U/L (ref 5–34)
Albumin: 3.5 g/dL (ref 3.5–5.0)
Alkaline Phosphatase: 58 U/L (ref 40–150)
Anion Gap: 7 mEq/L (ref 3–11)
BUN: 17.7 mg/dL (ref 7.0–26.0)
CALCIUM: 8.6 mg/dL (ref 8.4–10.4)
CHLORIDE: 104 meq/L (ref 98–109)
CO2: 30 mEq/L — ABNORMAL HIGH (ref 22–29)
CREATININE: 1 mg/dL (ref 0.7–1.3)
EGFR: 69 mL/min/{1.73_m2} — ABNORMAL LOW (ref >90)
GLUCOSE: 93 mg/dL (ref 70–140)
Potassium: 4.2 mEq/L (ref 3.5–5.1)
SODIUM: 141 meq/L (ref 136–145)
Total Bilirubin: 0.73 mg/dL (ref 0.20–1.20)
Total Protein: 5.6 g/dL — ABNORMAL LOW (ref 6.4–8.3)

## 2015-04-25 LAB — CBC WITH DIFFERENTIAL/PLATELET
BASO%: 0.3 % (ref 0.0–2.0)
Basophils Absolute: 0 10*3/uL (ref 0.0–0.1)
EOS%: 7.5 % — AB (ref 0.0–7.0)
Eosinophils Absolute: 0.2 10*3/uL (ref 0.0–0.5)
HEMATOCRIT: 36.4 % — AB (ref 38.4–49.9)
HEMOGLOBIN: 12.2 g/dL — AB (ref 13.0–17.1)
LYMPH#: 0.3 10*3/uL — AB (ref 0.9–3.3)
LYMPH%: 14.4 % (ref 14.0–49.0)
MCH: 32.9 pg (ref 27.2–33.4)
MCHC: 33.5 g/dL (ref 32.0–36.0)
MCV: 98.4 fL — ABNORMAL HIGH (ref 79.3–98.0)
MONO#: 0.2 10*3/uL (ref 0.1–0.9)
MONO%: 8.1 % (ref 0.0–14.0)
NEUT#: 1.4 10*3/uL — ABNORMAL LOW (ref 1.5–6.5)
NEUT%: 69.7 % (ref 39.0–75.0)
Platelets: 72 10*3/uL — ABNORMAL LOW (ref 140–400)
RBC: 3.7 10*6/uL — ABNORMAL LOW (ref 4.20–5.82)
RDW: 16.8 % — AB (ref 11.0–14.6)
WBC: 2 10*3/uL — ABNORMAL LOW (ref 4.0–10.3)

## 2015-04-25 MED ORDER — ONDANSETRON 8 MG PO TBDP
8.0000 mg | ORAL_TABLET | Freq: Once | ORAL | Status: AC
  Administered 2015-04-25: 8 mg via ORAL
  Filled 2015-04-25: qty 1

## 2015-04-25 MED ORDER — BORTEZOMIB CHEMO SQ INJECTION 3.5 MG (2.5MG/ML)
1.3000 mg/m2 | Freq: Once | INTRAMUSCULAR | Status: AC
  Administered 2015-04-25: 2.5 mg via SUBCUTANEOUS
  Filled 2015-04-25: qty 2.5

## 2015-04-25 MED ORDER — ONDANSETRON HCL 8 MG PO TABS
ORAL_TABLET | ORAL | Status: AC
  Filled 2015-04-25: qty 1

## 2015-04-25 NOTE — Assessment & Plan Note (Signed)
Patient continues to take low-dose Coumadin on a daily basis.  He takes 2 mg every day.  He states that he is not required for continual monitoring of his INR; since it has been such a low dose.  Patient denies any worsening issues with either easy bleeding or bruising.  He is noted have some healing bruises to his left forearm, however.

## 2015-04-25 NOTE — Patient Instructions (Signed)
[facility] Discharge Instructions for Patients Receiving Chemotherapy  Today you received the following chemotherapy agents VELCADE  To help prevent nausea and vomiting after your treatment, we encourage you to take your nausea medication   If you develop nausea and vomiting that is not controlled by your nausea medication, call the clinic.   BELOW ARE SYMPTOMS THAT SHOULD BE REPORTED IMMEDIATELY:  *FEVER GREATER THAN 100.5 F  *CHILLS WITH OR WITHOUT FEVER  NAUSEA AND VOMITING THAT IS NOT CONTROLLED WITH YOUR NAUSEA MEDICATION  *UNUSUAL SHORTNESS OF BREATH  *UNUSUAL BRUISING OR BLEEDING  TENDERNESS IN MOUTH AND THROAT WITH OR WITHOUT PRESENCE OF ULCERS  *URINARY PROBLEMS  *BOWEL PROBLEMS   UNUSUAL RASH Items with * indicate a potential emergency and should be followed up as soon as possible.  Feel free to call the clinic should you have any questions or concerns. The clinic phone number is [phone].  Please show the [facility] at check-in to the Emergency Department and triage nurse.     Thrombocytopenia Thrombocytopenia means there are not enough platelets in your blood. Platelets are tiny cells in your blood. When you start bleeding, platelets clump together around the cut or injury to stop the bleeding. This process is called blood clotting. Not having enough platelets can cause bleeding problems. HOME CARE  Check your skin and inside your mouth for bruises or blood as told by your doctor.  Check your spit (sputum), pee (urine), and poop (stool) for blood as told by your doctor.  Do not do activities that can cause bumps or bruises until your doctor says it is okay.  Be careful not to cut yourself when you shave or use scissors, needles, knives, or other tools.  Be careful not to burn yourself when you iron or cook.  Ask your doctor if you can drink alcohol.  Only take medicines as told by your doctor.  Tell all your doctors and  your dentist that you have this bleeding problem. GET HELP RIGHT AWAY IF:  You are bleeding anywhere on your body.  You are bleeding or have bruises without knowing why.  You have blood in your spit, pee, or poop. MAKE SURE YOU:  Understand these instructions.  Will watch your condition.  Will get help right away if you are not doing well or get worse. Document Released: 07/10/2011 Document Revised: 10/13/2011 Document Reviewed: 07/10/2011 [facility] Patient Information 2015 [place], [place]. This information is not intended to replace advice given to you by your health care provider. Make sure you discuss any questions you have with your health care provider.

## 2015-04-25 NOTE — Assessment & Plan Note (Signed)
Patient has previous been diagnosed with spinal metastasis; and continues to complain of some mild, chronic back pain.  He states that his pain is not bad enough to require hydrocodone at this time.  He confirmed he does have plenty of hydrocodone home to take if needed.

## 2015-04-25 NOTE — Progress Notes (Signed)
Per Selena Lesser , NP -it is okay to treat pt today with chemo and todays labs.

## 2015-04-25 NOTE — Assessment & Plan Note (Addendum)
Patient presented to the [facility] today to receive his weekly Velcade injection.  Patient states that he completed his last cycle of Revlimid oral therapy just last week; and is awaiting his next cycle of Revlimid to be delivered to his home.  Patient reports a few days of intermittent diarrhea; but states that taking Imodium manages his diarrhea fairly well.  He also continues to complain of some chronic back pain; but states that the pain is not bad enough for him to take his hydrocodone.  Patient denies any other new symptoms whatsoever.  He denies any recent fevers or chills.  On exam patient appears fatigued, slightly weak, but nontoxic.  He was in good spirits.  He continues with some mild expressive aphasia from his previous stroke.  He uses a walker for ambulation.  Vital signs were stable today; and blood count obtained today revealed a WBC of 2.0, ANC 1.4, hemoglobin 12.2, and platelet count has improved from 63 up to 72.  Dr. Julien Nordmann reviewed all recent myeloma marker results with both patient and his wife in detail; and a copy of his labs were given to the patient and his wife. Will closely monitor platelet count.   Patient is requesting a flu shot next week when he returns for his next Velcade injection.  Patient will proceed today with his weekly Velcade injection.  Patient plans to initiate his next cycle of Revlimid next week as directed.  Patient received his last Zometa on 04/11/2015; and he will not be due for his next Zometa until approximately 05/09/2015.  Patient is scheduled to return for labs and his next Velcade injection on 05/02/2015.

## 2015-04-25 NOTE — Progress Notes (Signed)
SYMPTOM MANAGEMENT CLINIC   HPI: Jeremiah Owens 81 y.o. male diagnosed with multiple myeloma; and with bone metastasis.  Patient currently undergoing Velcade injections, Revlimid oral therapy, and Zometa.  Patient presented to the [facility] today to receive his weekly Velcade injection.  Patient states that he completed his last cycle of Revlimid oral therapy just last week; and is awaiting his next cycle of Revlimid to be delivered to his home.  Patient reports a few days of intermittent diarrhea; but states that taking Imodium manages his diarrhea fairly well.  He also continues to complain of some chronic back pain; but states that the pain is not bad enough for him to take his hydrocodone.  Patient denies any other new symptoms whatsoever.  He denies any recent fevers or chills.  HPI  ROS  Past Medical History  Diagnosis Date  . Compression fracture   . Allergy   . Anxiety   . Thyroid disease   . S/P radiation therapy 08/21/14-09/04/14    T4-6 25Gy/46f  . S/P radiation therapy 11/29/14-12/12/14    rt prox humerus/shoulder/scapula 25Gy/152f . Encounter for antineoplastic chemotherapy 01/31/2015  . Melanoma   . Bone cancer     t_spine T-5  . Prostate cancer 2002  . Skin cancer 1994    melanoma  right neck   . Multiple myeloma     Past Surgical History  Procedure Laterality Date  . Hernia repair    . Posterior cervical fusion/foraminotomy N/A 06/17/2014    Procedure: Thoracic five laminectomy for epidural tumor resection Thoracic 4-7 posterior lateral arthrodesis, segmental pedicle screw fixation.;  Surgeon: KyAshok PallMD;  [facility];  Service: Neurosurgery;  Laterality: N/A;    has Metastatic bone cancer; Spine metastasis; Thoracic spine tumor; Neoplasm of thoracic spine; Paraplegia; Postoperative anemia due to acute blood loss; Multiple myeloma; Gait disorder; Cellulitis; Peripheral edema; Constipation; Rash; Encounter for antineoplastic chemotherapy;  Acute ischemic stroke; CVA (cerebral infarction); Aspiration pneumonia; Aphasia; Difficulty speaking; Speech difficult to understand; Stroke; Dizziness; Expressive aphasia; Past pointing; Right leg weakness; Basal ganglia infarction; Diarrhea; Cancer associated pain; and Long term current use of anticoagulant therapy on his problem list.    is allergic to iodine.    Medication List       This list is accurate as of: 04/25/15  1:09 PM.  Always use your most recent med list.               acyclovir 400 MG tablet  Commonly known as:  ZOVIRAX  TAKE 1 TABLET BY MOUTH TWICE A DAY     aspirin 81 MG EC tablet  Take 81 mg by mouth daily.     CALTRATE 600+D PO  Take 600 mg by mouth 2 (two) times daily.     CENTRUM SILVER PO  Take 1 tablet by mouth daily.     cholecalciferol 1000 UNITS tablet  Commonly known as:  VITAMIN D  Take 1,000 Units by mouth at bedtime.     dexamethasone 4 MG tablet  Commonly known as:  DECADRON  10 TAB EVERY WEEK, START WITH CHEMO     furosemide 20 MG tablet  Commonly known as:  LASIX  Take 20 mg by mouth 2 (two) times daily.     HYDROcodone-acetaminophen 5-325 MG per tablet  Commonly known as:  NORCO  Take 1 tablet by mouth every 6 (six) hours as needed for moderate pain.     KLOR-CON 10 10 MEQ tablet  Generic drug:  potassium chloride  Take 10 mEq by mouth daily.     lenalidomide 25 MG capsule  Commonly known as:  REVLIMID  Take 1 capsule (25 mg total) by mouth daily. Take one capsule ( 25 mg) by mouth for 21 days every 4 weeks-04/20/15 Authorization number=  [id number] adult male     levothyroxine 50 MCG tablet  Commonly known as:  SYNTHROID, LEVOTHROID  Take 50 mcg by mouth daily.     methocarbamol 500 MG tablet  Commonly known as:  ROBAXIN  TAKE 1 TABLET (500 MG TOTAL) BY MOUTH EVERY 6 (SIX) HOURS AS NEEDED FOR MUSCLE SPASMS.     omeprazole 40 MG capsule  Commonly known as:  PRILOSEC  Take 40 mg by mouth daily.     prochlorperazine 10 MG  tablet  Commonly known as:  COMPAZINE  Take 10 mg by mouth daily.     ranitidine 150 MG tablet  Commonly known as:  ZANTAC  Take 150 mg by mouth daily as needed for heartburn.     sennosides-docusate sodium 8.6-50 MG tablet  Commonly known as:  SENOKOT-S  Take 1-2 tablets by mouth 2 (two) times daily as needed for constipation. Takes 1-2 tabs daily     tamsulosin 0.4 MG Caps capsule  Commonly known as:  FLOMAX  Take 1 capsule (0.4 mg total) by mouth at bedtime.     warfarin 2 MG tablet  Commonly known as:  COUMADIN  Take 1 tablet (2 mg total) by mouth daily.         PHYSICAL EXAMINATION  Oncology Vitals 04/25/2015 04/18/2015 04/11/2015 04/10/2015 04/04/2015 03/28/2015 03/21/2015  Height 178 cm - 178 cm - - 178 cm -  Weight 71.487 kg - 70.67 kg - - 75.297 kg -  Weight (lbs) 157 lbs 10 oz - 155 lbs 13 oz - - 166 lbs -  BMI (kg/m2) 22.61 kg/m2 - 22.35 kg/m2 - - 23.82 kg/m2 -  Temp 97.7 97.6 98.5 - 97.9 98.3 97.2  Pulse 70 73 67 71 70 72 66  Resp '17 24 18 ' - '16 17 18  ' SpO2 100 100 100 98 100 99 -  BSA (m2) 1.88 m2 - 1.87 m2 - - 1.93 m2 -   BP Readings from Last 3 Encounters:  04/25/15 120/49  04/18/15 129/55  04/11/15 121/52    Physical Exam  Constitutional: He is oriented to person, place, and time.  Patient appears fatigued, weak, frail, and chronically ill.  HENT:  Head: Normocephalic and atraumatic.  Eyes: Conjunctivae and EOM are normal. Pupils are equal, round, and reactive to light. Right eye exhibits no discharge. Left eye exhibits no discharge. No scleral icterus.  Neck: Normal range of motion.  Pulmonary/Chest: Effort normal. No respiratory distress.  Musculoskeletal: Normal range of motion. He exhibits no edema or tenderness.  Neurological: He is alert and oriented to person, place, and time.  Ambulates with assistance of a walker.  Skin: Skin is warm and dry. No rash noted. No erythema. There is pallor.  Patient has some healing bruises to his forearms.    Psychiatric: Affect normal.  Nursing note and vitals reviewed.   LABORATORY DATA:. Appointment on 04/25/2015  Component Date Value Ref Range Status  . WBC 04/25/2015 2.0* 4.0 - 10.3 10e3/uL Final  . NEUT# 04/25/2015 1.4* 1.5 - 6.5 10e3/uL Final  . HGB 04/25/2015 12.2* 13.0 - 17.1 g/dL Final  . HCT 04/25/2015 36.4* 38.4 - 49.9 % Final  . Platelets 04/25/2015 72* 140 - 400 10e3/uL  Final  . MCV 04/25/2015 98.4* 79.3 - 98.0 fL Final  . MCH 04/25/2015 32.9  27.2 - 33.4 pg Final  . MCHC 04/25/2015 33.5  32.0 - 36.0 g/dL Final  . RBC 04/25/2015 3.70* 4.20 - 5.82 10e6/uL Final  . RDW 04/25/2015 16.8* 11.0 - 14.6 % Final  . lymph# 04/25/2015 0.3* 0.9 - 3.3 10e3/uL Final  . MONO# 04/25/2015 0.2  0.1 - 0.9 10e3/uL Final  . Eosinophils Absolute 04/25/2015 0.2  0.0 - 0.5 10e3/uL Final  . Basophils Absolute 04/25/2015 0.0  0.0 - 0.1 10e3/uL Final  . NEUT% 04/25/2015 69.7  39.0 - 75.0 % Final  . LYMPH% 04/25/2015 14.4  14.0 - 49.0 % Final  . MONO% 04/25/2015 8.1  0.0 - 14.0 % Final  . EOS% 04/25/2015 7.5* 0.0 - 7.0 % Final  . BASO% 04/25/2015 0.3  0.0 - 2.0 % Final  . Sodium 04/25/2015 141  136 - 145 mEq/L Final  . Potassium 04/25/2015 4.2  3.5 - 5.1 mEq/L Final  . Chloride 04/25/2015 104  98 - 109 mEq/L Final  . CO2 04/25/2015 30* 22 - 29 mEq/L Final  . Glucose 04/25/2015 93  70 - 140 mg/dl Final   Glucose reference range is for nonfasting patients. Fasting glucose reference range is 70- 100.  Marland Kitchen BUN 04/25/2015 17.7  7.0 - 26.0 mg/dL Final  . Creatinine 04/25/2015 1.0  0.7 - 1.3 mg/dL Final  . Total Bilirubin 04/25/2015 0.73  0.20 - 1.20 mg/dL Final  . Alkaline Phosphatase 04/25/2015 58  40 - 150 U/L Final  . AST 04/25/2015 16  5 - 34 U/L Final  . ALT 04/25/2015 23  0 - 55 U/L Final  . Total Protein 04/25/2015 5.6* 6.4 - 8.3 g/dL Final  . Albumin 04/25/2015 3.5  3.5 - 5.0 g/dL Final  . Calcium 04/25/2015 8.6  8.4 - 10.4 mg/dL Final  . Anion Gap 04/25/2015 7  3 - 11 mEq/L Final  . EGFR  04/25/2015 69* >90 ml/min/1.73 m2 Final   eGFR is calculated using the CKD-EPI Creatinine Equation (2009)     RADIOGRAPHIC STUDIES: No results found.  ASSESSMENT/PLAN:    Multiple myeloma Patient presented to the [facility] today to receive his weekly Velcade injection.  Patient states that he completed his last cycle of Revlimid oral therapy just last week; and is awaiting his next cycle of Revlimid to be delivered to his home.  Patient reports a few days of intermittent diarrhea; but states that taking Imodium manages his diarrhea fairly well.  He also continues to complain of some chronic back pain; but states that the pain is not bad enough for him to take his hydrocodone.  Patient denies any other new symptoms whatsoever.  He denies any recent fevers or chills.  On exam patient appears fatigued, slightly weak, but nontoxic.  He was in good spirits.  He continues with some mild expressive aphasia from his previous stroke.  He uses a walker for ambulation.  Vital signs were stable today; and blood count obtained today revealed a WBC of 2.0, ANC 1.4, hemoglobin 12.2, and platelet count has improved from 63 up to 72.  Dr. Julien Nordmann reviewed all recent myeloma marker results with both patient and his wife in detail; and a copy of his labs were given to the patient and his wife. Will closely monitor platelet count.   Patient is requesting a flu shot next week when he returns for his next Velcade injection.  Patient will proceed today  with his weekly Velcade injection.  Patient plans to initiate his next cycle of Revlimid next week as directed.  Patient received his last Zometa on 04/11/2015; and he will not be due for his next Zometa until approximately 05/09/2015.  Patient is scheduled to return for labs and his next Velcade injection on 05/02/2015.  Long term current use of anticoagulant therapy Patient continues to take low-dose Coumadin on a daily basis.  He takes 2 mg every day.  He  states that he is not required for continual monitoring of his INR; since it has been such a low dose.  Patient denies any worsening issues with either easy bleeding or bruising.  He is noted have some healing bruises to his left forearm, however.  Diarrhea Patient reports several episodes of diarrhea within the past few days; but states that they were easily managed with Imodium over-the-counter.  Cancer associated pain Patient has previous been diagnosed with spinal metastasis; and continues to complain of some mild, chronic back pain.  He states that his pain is not bad enough to require hydrocodone at this time.  He confirmed he does have plenty of hydrocodone home to take if needed.   Patient stated understanding of all instructions; and was in agreement with this plan of care. The patient knows to call the clinic with any problems, questions or concerns.   This was a shared visit with Dr. Julien Nordmann today.  Total time spent with patient was 25 minutes;  with greater than 75 percent of that time spent in face to face counseling regarding patient's symptoms,  and coordination of care and follow up.  Disclaimer:This dictation was prepared with Dragon/digital dictation along with [employer]. Any transcriptional errors that result from this process are unintentional.  Drue Second, NP 04/25/2015   ADDENDUM: Hematology/Oncology Attending: I had a face to face encounter with the patient. I recommended his care plan. This is a very pleasant 81 years old white male with multiple myeloma currently undergoing treatment with subcutaneous Velcade, Revlimid and Decadron. He is tolerating his treatment well with no significant adverse effect except for occasional diarrhea. He is taking Imodium with improvement of the diarrhea. I recommended for the patient to continue his treatment as scheduled. He would come back for follow-up visit in one month's for reevaluation.  Disclaimer: This note  was dictated with voice recognition software. Similar sounding words can inadvertently be transcribed and may not be corrected upon review. Eilleen Kempf., MD 04/28/2015

## 2015-04-25 NOTE — Assessment & Plan Note (Signed)
Patient reports several episodes of diarrhea within the past few days; but states that they were easily managed with Imodium over-the-counter.

## 2015-04-26 ENCOUNTER — Ambulatory Visit: Payer: Medicare Other | Admitting: Physical Therapy

## 2015-04-26 ENCOUNTER — Ambulatory Visit: Payer: Medicare Other | Admitting: Speech Pathology

## 2015-04-26 DIAGNOSIS — R4701 Aphasia: Secondary | ICD-10-CM

## 2015-04-26 DIAGNOSIS — R269 Unspecified abnormalities of gait and mobility: Secondary | ICD-10-CM | POA: Diagnosis not present

## 2015-04-26 DIAGNOSIS — I69359 Hemiplegia and hemiparesis following cerebral infarction affecting unspecified side: Secondary | ICD-10-CM | POA: Diagnosis not present

## 2015-04-26 DIAGNOSIS — R2681 Unsteadiness on feet: Secondary | ICD-10-CM

## 2015-04-26 DIAGNOSIS — M6281 Muscle weakness (generalized): Secondary | ICD-10-CM | POA: Diagnosis not present

## 2015-04-26 DIAGNOSIS — R29898 Other symptoms and signs involving the musculoskeletal system: Secondary | ICD-10-CM | POA: Diagnosis not present

## 2015-04-26 NOTE — Therapy (Signed)
[facility] [address] [place], [place], [address] Phone: [phone]   Fax:  [phone]  Speech Language Pathology Treatment  Patient Details  Name: Jeremiah Owens MRN: [id number] Date of Birth: Oct 13, 1929 Referring Provider:  Wenda Low, MD  Encounter Date: 04/26/2015      End of Session - 04/26/15 1103    Visit Number 15   Number of Visits 24   Date for SLP Re-Evaluation 05/24/15   SLP Start Time 1017   SLP Stop Time  1103   SLP Time Calculation (min) 46 min   Activity Tolerance Patient tolerated treatment well      Past Medical History  Diagnosis Date  . Compression fracture   . Allergy   . Anxiety   . Thyroid disease   . S/P radiation therapy 08/21/14-09/04/14    T4-6 25Gy/36f  . S/P radiation therapy 11/29/14-12/12/14    rt prox humerus/shoulder/scapula 25Gy/169f . Encounter for antineoplastic chemotherapy 01/31/2015  . Melanoma   . Bone cancer     t_spine T-5  . Prostate cancer 2002  . Skin cancer 1994    melanoma  right neck   . Multiple myeloma     Past Surgical History  Procedure Laterality Date  . Hernia repair    . Posterior cervical fusion/foraminotomy N/A 06/17/2014    Procedure: Thoracic five laminectomy for epidural tumor resection Thoracic 4-7 posterior lateral arthrodesis, segmental pedicle screw fixation.;  Surgeon: KyAshok PallMD;  [facility];  Service: Neurosurgery;  Laterality: N/A;    There were no vitals filed for this visit.  Visit Diagnosis: Expressive aphasia - Plan: SLP plan of care cert/re-cert      Subjective Assessment - 04/26/15 1028    Subjective "I did the homework but I was very busy yesterday wtih chemo and Wednesday night supper"   Currently in Pain? No/denies               ADULT SLP TREATMENT - 04/26/15 1029    General Information   Behavior/Cognition Alert;Cooperative;Pleasant mood   Treatment Provided   Treatment provided  Cognitive-Linquistic   Cognitive-Linquistic Treatment   Treatment focused on Aphasia   Skilled Treatment Divergent naming tasks in simple categories  - pt named 3-5 items with extended time prior to requiring semantic/phonemic cues. Pt. utlized compensations for word finding episodes with rare min A to mod. Facilitated sentences generation with multiple meaning sentences with extended  time and occasional min A to generate sentences instead of providing definition.    Assessment / Recommendations / Plan   Plan Continue with current plan of care   Progression Toward Goals   Progression toward goals Progressing toward goals          SLP Education - 04/26/15 1050    Education provided Yes   Education Details compensations for word finding   Person(s) Educated Patient   Methods Explanation   Comprehension Verbalized understanding          SLP Short Term Goals - 04/26/15 1055    SLP SHORT TERM GOAL #1   Title pt will produce sentence responses in mod complex tasks 80% and rare min A   Status Not Met   SLP SHORT TERM GOAL #2   Title pt will demo HEP for swallowing with rare min A   Status Achieved   SLP SHORT TERM GOAL #3   Title pt will demo compensatory strategies for anomia with occasasional min A   Status Not Met  SLP SHORT TERM GOAL #4   Title Pt's signature/ basic demographic info will not show any signs of aphasia   Status Partially Met          SLP Long Term Goals - 04/26/15 1056    SLP LONG TERM GOAL #1   Title pt will demo 10 minutes mod complex conversation fucntionally with occasional verbal cues   Baseline Continue goal upon renewal 04/30/15  to 06/03/15   Time 4   Period Weeks   Status Revised   SLP LONG TERM GOAL #2   Title pt will demo HEP for swallowing with modified independence   Baseline Continue goal upon renewal 04/30/15 to 06/03/15   Time 4   Period Weeks   Status On-going   SLP LONG TERM GOAL #3   Title pt will write checks adequately with rare  min cues   Baseline Continue goal upon renewal 04/30/15 to 06/03/15   Time 2   Period Weeks   Status On-going          Plan - 04/26/15 1107    Clinical Impression Statement Pt requires continued skilled ST to maximize verbal expression and written expression as aphasia persists requiring min to mod A to participate in conversation due to aphasia. . Recommend continue ST goals for 2x a week for 4 more weeks.         Problem List Patient Active Problem List   Diagnosis Date Noted  . Diarrhea 04/25/2015  . Cancer associated pain 04/25/2015  . Long term current use of anticoagulant therapy 04/25/2015  . Basal ganglia infarction 02/12/2015  . Dizziness   . Expressive aphasia   . Past pointing   . Right leg weakness   . Acute ischemic stroke 02/11/2015  . CVA (cerebral infarction) 02/11/2015  . Aspiration pneumonia 02/11/2015  . Aphasia   . Difficulty speaking   . Speech difficult to understand   . Stroke   . Encounter for antineoplastic chemotherapy 01/31/2015  . Constipation 12/20/2014  . Rash 12/20/2014  . Cellulitis 12/06/2014  . Peripheral edema 12/06/2014  . Gait disorder 09/05/2014  . Multiple myeloma   . Paraplegia 06/21/2014  . Postoperative anemia due to acute blood loss 06/21/2014  . 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 Spine metastasis 06/17/2014  . Thoracic spine tumor 06/17/2014  . Metastatic bone cancer 06/15/2014    Karlye Ihrig, Annye Rusk MS, CCC-SLP 04/26/2015, 11:08 AM  [facility] [address] [place], [place], [address] Phone: [phone]   Fax:  [phone]

## 2015-04-26 NOTE — Patient Instructions (Signed)
  Continue homework packet

## 2015-04-26 NOTE — Therapy (Signed)
[facility] [address] [place], [place], [address] Phone: [phone]   Fax:  [phone]  Physical Therapy Treatment  Patient Details  Name: Jeremiah Owens MRN: [id number] Date of Birth: 1930/01/29 Referring Provider:  Wenda Low, MD  Encounter Date: 04/26/2015      PT End of Session - 04/26/15 2037    Visit Number 16   Number of Visits 24  2x/week for 4 additional weeks   Date for PT Re-Evaluation 05/26/15   Authorization Type KX;  Medicare [employer] - G codes required every 10 visits   PT Start Time 1104   PT Stop Time 1146   PT Time Calculation (min) 42 min   Activity Tolerance Patient tolerated treatment well   Behavior During Therapy WFL for tasks assessed/performed      Past Medical History  Diagnosis Date  . Compression fracture   . Allergy   . Anxiety   . Thyroid disease   . S/P radiation therapy 08/21/14-09/04/14    T4-6 25Gy/61f  . S/P radiation therapy 11/29/14-12/12/14    rt prox humerus/shoulder/scapula 25Gy/118f . Encounter for antineoplastic chemotherapy 01/31/2015  . Melanoma   . Bone cancer     t_spine T-5  . Prostate cancer 2002  . Skin cancer 1994    melanoma  right neck   . Multiple myeloma     Past Surgical History  Procedure Laterality Date  . Hernia repair    . Posterior cervical fusion/foraminotomy N/A 06/17/2014    Procedure: Thoracic five laminectomy for epidural tumor resection Thoracic 4-7 posterior lateral arthrodesis, segmental pedicle screw fixation.;  Surgeon: KyAshok PallMD;  [facility];  Service: Neurosurgery;  Laterality: N/A;    There were no vitals filed for this visit.  Visit Diagnosis:  Abnormality of gait - Plan: PT plan of care cert/re-cert  Hemiparesis due to recent cerebral infarction - Plan: PT plan of care cert/re-cert  Unsteadiness - Plan: PT plan of care cert/re-cert                       [facility]  Treatment/Exercise - 04/26/15 0001    Transfers   Transfers Sit to Stand;Stand to Sit   Sit to Stand 6: Modified independent (Device/Increase time);5: Supervision   Stand to Sit 6: Modified independent (Device/Increase time)   Ambulation/Gait   Ambulation/Gait Yes   Ambulation/Gait Assistance 5: Supervision;4: Min guard;4: Min assist  variable due to LOB.    Ambulation/Gait Assistance Details x350' outdoors (paved, unlevel surfaces) with min guard to min A; supervision over level surfaces   Ambulation Distance (Feet) 550 Feet  x200' outdoors; remainder indoors   Assistive device Straight cane   Gait Pattern Step-through pattern;Decreased hip/knee flexion - right;Decreased dorsiflexion - right;Decreased weight shift to left;Right foot flat;Trunk flexed;Poor foot clearance - right   Ambulation Surface Level;Unlevel;Indoor;Outdoor;Paved   Door Management 4: Min assist   Door Managment Details (indicate cue type and reason) with [place] 5: Supervision   Curb Details (indicate cue type and reason) x2 trials; no cueing required  for sequencing/technique   Timed Up and Go Test   TUG Normal TUG   Normal [facility] (seconds) 15.05  [facility]              PT Education - 04/26/15 2043    Education provided Yes   Education Details Goals, progress, and POC.   Person(s) Educated Patient   Methods Explanation  Comprehension Verbalized understanding          PT Short Term Goals - 04/24/15 1427    PT SHORT TERM GOAL #1   Title Pt will perform home exercises with mod I using paper handout to indicate safe HEP compliance. Target date: 03/26/15   Baseline Met 8/15.   Status Achieved   PT SHORT TERM GOAL #2   Title Perform Berg Balance Scale to assess stability with functional standing balance.  Target date: 03/26/15   Baseline 8/1: score 29/56   Status Achieved   PT SHORT TERM GOAL #3   Title Pt will decrease TUG time from 21.58 to 17.5 seconds or less to indicate improved efficiency  of functional mobility.  Target date: 03/26/15   Baseline 9/1: 15.94 sec (average of 3 trials) with rollator   Status Achieved   PT SHORT TERM GOAL #4   Title Pt will negotiate standard curb step with mod I using LRAD to indicate increased safety/independence with community mobility.  Target date: 03/26/15   Baseline Met 8/15.   Status Achieved   PT SHORT TERM GOAL #5   Title Pt will ambulate at self-selected gait speed of 2.63 ft/sec to reach status of community ambulator. Target date: 03/26/15   Baseline 8/15: 2.65 ft/sec with rollator   Status Achieved   PT SHORT TERM GOAL #6   Title Pt will perform sit > stand from standard chair without UE support to indicate increased functional LE strength.  Target date: 03/26/15   Baseline 8/23: sit > stand without UE support with supervision, cueing for full anterior weight shift   Status Partially Met           PT Long Term Goals - 04/26/15 1112    PT LONG TERM GOAL #1   Title Pt/spouse will verbalize understanding of CVA warning signs to indicate understanding of situation in which to seek medical attention. Target date: 04/23/15.   Baseline Met 9/22.   Status Achieved   PT LONG TERM GOAL #2   Title Pt will ambulate x200' over indoor, level surfaces with mod I using SPC to indicate increased pt independence with household mobility, progress toward pt-stated goal. Modified target date: 05/24/15.   Baseline 9/22: supervision required.  Continue goal through extended POC.   Status Not Met   PT LONG TERM GOAL #3   Title Pt will perform TUG test in < 13.5 sconds to indicate decreased risk of falling. Target date: 04/23/15.   Baseline 9/22: 15.05 sec with SPC   Status Achieved   PT LONG TERM GOAL #4   Title Pt will increase Berg Balance Scale score by 6 points from baseline to indicate improvement in functional standing balance. Target date: 04/23/15.   Baseline 8/1: score 29/56 ;   9/20: score 44/56   Status Achieved   PT LONG TERM GOAL #5    Title Pt will ambulate x500' over unlevel, paved surfaces with mod I using single point cane with no overt LOB to indicate increased safety with community mobility, progress toward pt-stated goal of walking with SPC.  Modified target date: 05/24/15.   Baseline Requires supervision to min guard for outdoor gait up to 350'   Status Not Met   PT LONG TERM GOAL #6   Title Pt will negotiate 4 stairs with 1 rail with mod I using LRAD to indicate increased independence with community access. Target date: 04/23/15.   Baseline Met 9/22.   Status Achieved   PT LONG TERM GOAL #  7   Title Pt will negotiate standard ramp and curb step with mod I using SPC to indicate safety traversing community obstacles. Modified target date: 05/24/15   Baseline 9/22: requires supervision  Continue goal through extended POC.   Status Not Met   PT LONG TERM GOAL #8   Title Pt will perform floor transfer with mod I using standard chair to enble pt to safely recover from fall. Target date: 05/24/15.   Status New               Plan - 04/26/15 2040    Clinical Impression Statement Pt has met 4 of 7 LTG's, indicating decreased fall risk, improved functional standing balance, increased independence with stair negotiation, and pt understanding of CVA education. Pt will continue to benefit from skilled outpatient Pt 2x/week for additional 4 weeks to continue to progress functional mobility, further decrease risk of falling, and to increase pt safety with fall recovery (LTG added for floor transfer). Pt verbalized understanding and was in full agreement with POC.   Pt will benefit from skilled therapeutic intervention in order to improve on the following deficits Abnormal gait;Decreased balance;Difficulty walking;Decreased strength;Postural dysfunction;Decreased knowledge of use of DME;Decreased activity tolerance;Decreased endurance   Rehab Potential Good   Clinical Impairments Affecting Rehab Potential Currently undergoing  chemotherapy 1x/week   PT Frequency 2x / week   PT Duration 4 weeks   PT Treatment/Interventions ADLs/Self Care Home Management;Gait training;Therapeutic exercise;Patient/family education;Stair training;Balance training;Functional mobility training;Neuromuscular re-education;Manual techniques;Therapeutic activities;DME Instruction;Vestibular;Orthotic Fit/Training   PT Next Visit Plan Progress HEP. Continue gait training with SPC.   Consulted and Agree with Plan of Care Patient        Problem List Patient Active Problem List   Diagnosis Date Noted  . Diarrhea 04/25/2015  . Cancer associated pain 04/25/2015  . Long term current use of anticoagulant therapy 04/25/2015  . Basal ganglia infarction 02/12/2015  . Dizziness   . Expressive aphasia   . Past pointing   . Right leg weakness   . Acute ischemic stroke 02/11/2015  . CVA (cerebral infarction) 02/11/2015  . Aspiration pneumonia 02/11/2015  . Aphasia   . Difficulty speaking   . Speech difficult to understand   . Stroke   . Encounter for antineoplastic chemotherapy 01/31/2015  . Constipation 12/20/2014  . Rash 12/20/2014  . Cellulitis 12/06/2014  . Peripheral edema 12/06/2014  . Gait disorder 09/05/2014  . Multiple myeloma   . Paraplegia 06/21/2014  . Postoperative anemia due to acute blood loss 06/21/2014  . 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 Spine metastasis 06/17/2014  . Thoracic spine tumor 06/17/2014  . Metastatic bone cancer 06/15/2014    Billie Ruddy, PT, DPT [facility] [address] [place], [place], [address] Phone: [phone]   Fax:  [phone] 04/26/2015, 8:52 PM

## 2015-04-30 ENCOUNTER — Encounter: Payer: Self-pay | Admitting: Neurology

## 2015-04-30 ENCOUNTER — Ambulatory Visit (INDEPENDENT_AMBULATORY_CARE_PROVIDER_SITE_OTHER): Payer: Medicare Other | Admitting: Neurology

## 2015-04-30 VITALS — BP 115/59 | HR 62 | Ht 69.0 in | Wt 159.6 lb

## 2015-04-30 DIAGNOSIS — I639 Cerebral infarction, unspecified: Secondary | ICD-10-CM

## 2015-04-30 DIAGNOSIS — I6381 Other cerebral infarction due to occlusion or stenosis of small artery: Secondary | ICD-10-CM

## 2015-04-30 NOTE — Progress Notes (Signed)
[facility] [address] [place]. [place] [address] [phone]       OFFICE FOLLOW-UP NOTE  [name] Date of Birth:  1930/03/02 Medical Record Number:  [id number]   HPI: [name] is seen today for first office follow-up visit following hospital admission for stroke in July 2016. Jeremiah Owens is an 81 y.o. male with a history of multiple myeloma treated with Revlamid who presented with 2 days of right leg weakness and difficulty speaking. He states that this started quite abruptly. Prior to this, he did not have any problems with his speech either. He states that he had difficulty with finding his words, and indeed it was difficult getting history from him because of some word finding difficulty.LKW: 7/8 tpa given?: no, out of window CT scan head on admission showed no acute abnormality and only moderate changes of chronic small vessel disease. MRI scan of the brain showed an acute 89 9 mm left basal ganglia lacunar infarct and old right caudate lacunar infarct. MRA brain showed no large vessel occlusion or stenosis. Transthoracic echo showed ejection fraction of 55-60% without wall motion abnormalities. Carotid ultrasound showed no significant extracranial stenosis or occlusion. HDL cholesterol was 76 mg percent total cholesterol 165. Hb A1c was 5.7. Patient states is still slightly weak and left-sided pedicle his leg. He is currently getting outpatient physical and occupational therapy and has made good recovery. His able to walk with a wheeled walker for clot or balance and uses a cane Dose. Patient is currently having ongoing chemotherapy for his myeloma and tolerating it well. The patient complains of significant skin bruising as he is on both aspirin 81 as well as warfarin.. I am not clear as to why he is on warfarin. He states his speech difficulties from his stroke symptoms have improved significantly though he has developed some trouble walking and dragging  his left leg and this may be a chronic problem not related to the present stroke ROS:   14 system review of systems is positive for diarrhea, constipation, hearing loss, leg swelling, restless leg, daytime sleepiness, walking difficulty, easy bruising and bleeding, speech difficulty  PMH:  Past Medical History  Diagnosis Date  . Compression fracture   . Allergy   . Anxiety   . Thyroid disease   . S/P radiation therapy 08/21/14-09/04/14    T4-6 25Gy/20fx  . S/P radiation therapy 11/29/14-12/12/14    rt prox humerus/shoulder/scapula 25Gy/50fx  . Encounter for antineoplastic chemotherapy 01/31/2015  . Melanoma   . Bone cancer     t_spine T-5  . Prostate cancer 2002  . Skin cancer 1994    melanoma  right neck   . Multiple myeloma   . Hearing loss   . Stroke     Social History:  Social History   Social History  . Marital Status: Married    Spouse Name: N/A  . Number of Children: N/A  . Years of Education: N/A   Occupational History  . Not on file.   Social History Main Topics  . Smoking status: Never Smoker   . Smokeless tobacco: Not on file  . Alcohol Use: No  . Drug Use: No  . Sexual Activity: Not on file   Other Topics Concern  . Not on file   Social History Narrative    Medications:   Current Outpatient Prescriptions on File Prior to Visit  Medication Sig Dispense Refill  . acyclovir (ZOVIRAX) 400 MG tablet TAKE 1 TABLET BY MOUTH TWICE  A DAY 60 tablet 2  . aspirin 81 MG EC tablet Take 81 mg by mouth daily.  0  . Calcium Carbonate-Vitamin D (CALTRATE 600+D PO) Take 600 mg by mouth 2 (two) times daily.    . cholecalciferol (VITAMIN D) 1000 UNITS tablet Take 1,000 Units by mouth at bedtime.     Marland Kitchen dexamethasone (DECADRON) 4 MG tablet 10 TAB EVERY WEEK, START WITH CHEMO 40 tablet 4  . furosemide (LASIX) 20 MG tablet Take 20 mg by mouth 2 (two) times daily.    Marland Kitchen HYDROcodone-acetaminophen (NORCO) 5-325 MG per tablet Take 1 tablet by mouth every 6 (six) hours as needed  for moderate pain. 60 tablet 0  . KLOR-CON 10 10 MEQ tablet Take 10 mEq by mouth daily.    Marland Kitchen lenalidomide (REVLIMID) 25 MG capsule Take 1 capsule (25 mg total) by mouth daily. Take one capsule ( 25 mg) by mouth for 21 days every 4 weeks-04/20/15 Authorization number=  [id number] adult male 21 capsule 0  . levothyroxine (SYNTHROID, LEVOTHROID) 50 MCG tablet Take 50 mcg by mouth daily.  1  . methocarbamol (ROBAXIN) 500 MG tablet TAKE 1 TABLET (500 MG TOTAL) BY MOUTH EVERY 6 (SIX) HOURS AS NEEDED FOR MUSCLE SPASMS. 180 tablet 4  . Multiple Vitamins-Minerals (CENTRUM SILVER PO) Take 1 tablet by mouth daily.    Marland Kitchen omeprazole (PRILOSEC) 40 MG capsule Take 40 mg by mouth daily.    . prochlorperazine (COMPAZINE) 10 MG tablet Take 10 mg by mouth daily.  0  . ranitidine (ZANTAC) 150 MG tablet Take 150 mg by mouth daily as needed for heartburn.   11  . sennosides-docusate sodium (SENOKOT-S) 8.6-50 MG tablet Take 1-2 tablets by mouth 2 (two) times daily as needed for constipation. Takes 1-2 tabs daily    . tamsulosin (FLOMAX) 0.4 MG CAPS capsule Take 1 capsule (0.4 mg total) by mouth at bedtime. 30 capsule 3  . warfarin (COUMADIN) 2 MG tablet Take 1 tablet (2 mg total) by mouth daily. 90 tablet 4   No current facility-administered medications on file prior to visit.    Allergies:   Allergies  Allergen Reactions  . Iodine Itching    Patient had a reaction 20 years ago and can't remember what kind of reaction he had.  Patient was given decadron in the ER and had an Angio ordered and did test with no problems.  Bottom line "patient needs to pre-medicated before any IV dye is given".    Physical Exam General: well developed, well nourished, seated, in no evident distress Head: head normocephalic and atraumatic.  Neck: supple with no carotid or supraclavicular bruits Cardiovascular: regular rate and rhythm, no murmurs Musculoskeletal: no deformity Skin:  no rash/but scattered petechiae over her forearms and  legs Vascular:  Normal pulses all extremities Filed Vitals:   04/30/15 1146  BP: 115/59  Pulse: 62   Neurologic Exam Mental Status: Awake and fully alert. Oriented to place and time. Recent and remote memory intact. Attention span, concentration and fund of knowledge appropriate. Mood and affect appropriate.  Cranial Nerves: Fundoscopic exam reveals sharp disc margins. Pupils equal, briskly reactive to light. Extraocular movements full without nystagmus. Visual fields full to confrontation. Hearing mildly diminished bilaterally despite hearing aids.. Facial sensation intact. Face, tongue, palate moves normally and symmetrically.  Motor: Normal bulk and tone. Normal strength in all tested extremity muscles. Except mild weakness of left grip, hip flexors and ankle dorsiflexors. Diminished fine finger movements on the left. Orbits right over  left upper extremity. Sensory.: intact to touch ,pinprick .position and vibratory sensation.  Coordination: Rapid alternating movements normal in all extremities. Finger-to-nose  performed accurately bilaterally but impaired. heel-to-shin on the left Gait and Station: Arises from chair with slight difficulty. Stance is stooped. Gait demonstrates dragging of left leg and spasticity .  Uses for with a walker. Balance poor. Reflexes: 1+ and symmetric. Toes downgoing.   NIHSS  2 Modified Rankin  2   ASSESSMENT: 81 year male with leftt basal ganglia infarct in July 2016 due to small vessel disease with vascular risk factors of atrial fibrillation, hypertension and malignancy with chemotherapy    PLAN: I had a long d/w patient and wife about his recent stroke, risk for recurrent stroke/TIAs, personally independently reviewed imaging studies and stroke evaluation results and answered questions.Continue aspirin 81 mg orally every day  for secondary stroke prevention and maintain strict control of hypertension with blood pressure goal below 130/90, diabetes with  hemoglobin A1c goal below 6.5% and lipids with LDL cholesterol goal below 100 mg/dL. I also advised the patient to eat a healthy diet with plenty of whole grains, cereals, fruits and vegetables, exercise regularly and maintain ideal body weight I also talked about fall risk and home safety precautions and advised the patient to use a walker at all times.Greater than 50% of time during this 25 minute visit was spent on counseling, discussion with patient and family and coordination of care  Followup in the future with me in 6 months or call earlier if necessary. Antony Contras, MD  Note: This document was prepared with digital dictation and possible smart phrase technology. Any transcriptional errors that result from this process are unintentional

## 2015-04-30 NOTE — Patient Instructions (Signed)
I had a long d/w patient and wife about his recent stroke, risk for recurrent stroke/TIAs, personally independently reviewed imaging studies and stroke evaluation results and answered questions.Continue aspirin 81 mg orally every day  for secondary stroke prevention and maintain strict control of hypertension with blood pressure goal below 130/90, diabetes with hemoglobin A1c goal below 6.5% and lipids with LDL cholesterol goal below 100 mg/dL. I also advised the patient to eat a healthy diet with plenty of whole grains, cereals, fruits and vegetables, exercise regularly and maintain ideal body weight I also talked about fall risk and home safety precautions and advised the patient to use a walker at all times. Followup in the future with me in 6 months or call earlier if necessary. Stroke Prevention Some medical conditions and behaviors are associated with an increased chance of having a stroke. You may prevent a stroke by making healthy choices and managing medical conditions. HOW CAN I REDUCE MY RISK OF HAVING A STROKE?   Stay physically active. Get at least 30 minutes of activity on most or all days.  Do not smoke. It may also be helpful to avoid exposure to secondhand smoke.  Limit alcohol use. Moderate alcohol use is considered to be:  No more than 2 drinks per day for men.  No more than 1 drink per day for nonpregnant women.  Eat healthy foods. This involves:  Eating 5 or more servings of fruits and vegetables a day.  Making dietary changes that address high blood pressure (hypertension), high cholesterol, diabetes, or obesity.  Manage your cholesterol levels.  Making food choices that are high in fiber and low in saturated fat, trans fat, and cholesterol may control cholesterol levels.  Take any prescribed medicines to control cholesterol as directed by your health care Denim Start.  Manage your diabetes.  Controlling your carbohydrate and sugar intake is recommended to manage  diabetes.  Take any prescribed medicines to control diabetes as directed by your health care Brighten Orndoff.  Control your hypertension.  Making food choices that are low in salt (sodium), saturated fat, trans fat, and cholesterol is recommended to manage hypertension.  Take any prescribed medicines to control hypertension as directed by your health care Daylee Delahoz.  Maintain a healthy weight.  Reducing calorie intake and making food choices that are low in sodium, saturated fat, trans fat, and cholesterol are recommended to manage weight.  Stop drug abuse.  Avoid taking birth control pills.  Talk to your health care Jarryd Gratz about the risks of taking birth control pills if you are over 86 years old, smoke, get migraines, or have ever had a blood clot.  Get evaluated for sleep disorders (sleep apnea).  Talk to your health care Gladstone Rosas about getting a sleep evaluation if you snore a lot or have excessive sleepiness.  Take medicines only as directed by your health care Geordie Nooney.  For some people, aspirin or blood thinners (anticoagulants) are helpful in reducing the risk of forming abnormal blood clots that can lead to stroke. If you have the irregular heart rhythm of atrial fibrillation, you should be on a blood thinner unless there is a good reason you cannot take them.  Understand all your medicine instructions.  Make sure that other conditions (such as anemia or atherosclerosis) are addressed. SEEK IMMEDIATE MEDICAL CARE IF:   You have sudden weakness or numbness of the face, arm, or leg, especially on one side of the body.  Your face or eyelid droops to one side.  You have sudden  confusion.  You have trouble speaking (aphasia) or understanding.  You have sudden trouble seeing in one or both eyes.  You have sudden trouble walking.  You have dizziness.  You have a loss of balance or coordination.  You have a sudden, severe headache with no known cause.  You have new chest  pain or an irregular heartbeat. Any of these symptoms may represent a serious problem that is an emergency. Do not wait to see if the symptoms will go away. Get medical help at once. Call your local emergency services (911 in [place] Do not drive yourself to the hospital. Document Released: 08/28/2004 Document Revised: 12/05/2013 Document Reviewed: 01/21/2013 [facility] Patient Information 2015 [place], [place]. This information is not intended to replace advice given to you by your health care Isao Seltzer. Make sure you discuss any questions you have with your health care Vernia Teem.  Fall Prevention and Home Safety Falls cause injuries and can affect all age groups. It is possible to use preventive measures to significantly decrease the likelihood of falls. There are many simple measures which can make your home safer and prevent falls. OUTDOORS  Repair cracks and edges of walkways and driveways.  Remove high doorway thresholds.  Trim shrubbery on the main path into your home.  Have good outside lighting.  Clear walkways of tools, rocks, debris, and clutter.  Check that handrails are not broken and are securely fastened. Both sides of steps should have handrails.  Have leaves, snow, and ice cleared regularly.  Use sand or salt on walkways during winter months.  In the garage, clean up grease or oil spills. BATHROOM  Install night lights.  Install grab bars by the toilet and in the tub and shower.  Use non-skid mats or decals in the tub or shower.  Place a plastic non-slip stool in the shower to sit on, if needed.  Keep floors dry and clean up all water on the floor immediately.  Remove soap buildup in the tub or shower on a regular basis.  Secure bath mats with non-slip, double-sided rug tape.  Remove throw rugs and tripping hazards from the floors. BEDROOMS  Install night lights.  Make sure a bedside light is easy to reach.  Do not use oversized bedding.  Keep a telephone  by your bedside.  Have a firm chair with side arms to use for getting dressed.  Remove throw rugs and tripping hazards from the floor. KITCHEN  Keep handles on pots and pans turned toward the center of the stove. Use back burners when possible.  Clean up spills quickly and allow time for drying.  Avoid walking on wet floors.  Avoid hot utensils and knives.  Position shelves so they are not too high or low.  Place commonly used objects within easy reach.  If necessary, use a sturdy step stool with a grab bar when reaching.  Keep electrical cables out of the way.  Do not use floor polish or wax that makes floors slippery. If you must use wax, use non-skid floor wax.  Remove throw rugs and tripping hazards from the floor. STAIRWAYS  Never leave objects on stairs.  Place handrails on both sides of stairways and use them. Fix any loose handrails. Make sure handrails on both sides of the stairways are as long as the stairs.  Check carpeting to make sure it is firmly attached along stairs. Make repairs to worn or loose carpet promptly.  Avoid placing throw rugs at the top or bottom of stairways,  or properly secure the rug with carpet tape to prevent slippage. Get rid of throw rugs, if possible.  Have an electrician put in a light switch at the top and bottom of the stairs. OTHER FALL PREVENTION TIPS  Wear low-heel or rubber-soled shoes that are supportive and fit well. Wear closed toe shoes.  When using a stepladder, make sure it is fully opened and both spreaders are firmly locked. Do not climb a closed stepladder.  Add color or contrast paint or tape to grab bars and handrails in your home. Place contrasting color strips on first and last steps.  Learn and use mobility aids as needed. Install an electrical emergency response system.  Turn on lights to avoid dark areas. Replace light bulbs that burn out immediately. Get light switches that glow.  Arrange furniture to create  clear pathways. Keep furniture in the same place.  Firmly attach carpet with non-skid or double-sided tape.  Eliminate uneven floor surfaces.  Select a carpet pattern that does not visually hide the edge of steps.  Be aware of all pets. OTHER HOME SAFETY TIPS  Set the water temperature for 120 F (48.8 C).  Keep emergency numbers on or near the telephone.  Keep smoke detectors on every level of the home and near sleeping areas. Document Released: 07/11/2002 Document Revised: 01/20/2012 Document Reviewed: 10/10/2011 [facility] Patient Information 2015 [place], [place]. This information is not intended to replace advice given to you by your health care Henri Guedes. Make sure you discuss any questions you have with your health care Naudia Crosley.

## 2015-05-01 ENCOUNTER — Encounter: Payer: Self-pay | Admitting: Rehabilitation

## 2015-05-01 ENCOUNTER — Ambulatory Visit: Payer: Medicare Other | Admitting: Rehabilitation

## 2015-05-01 ENCOUNTER — Ambulatory Visit: Payer: Medicare Other

## 2015-05-01 DIAGNOSIS — R269 Unspecified abnormalities of gait and mobility: Secondary | ICD-10-CM

## 2015-05-01 DIAGNOSIS — R4701 Aphasia: Secondary | ICD-10-CM

## 2015-05-01 DIAGNOSIS — M6281 Muscle weakness (generalized): Secondary | ICD-10-CM | POA: Diagnosis not present

## 2015-05-01 DIAGNOSIS — R531 Weakness: Secondary | ICD-10-CM

## 2015-05-01 DIAGNOSIS — R2681 Unsteadiness on feet: Secondary | ICD-10-CM

## 2015-05-01 DIAGNOSIS — I69359 Hemiplegia and hemiparesis following cerebral infarction affecting unspecified side: Secondary | ICD-10-CM | POA: Diagnosis not present

## 2015-05-01 DIAGNOSIS — R29898 Other symptoms and signs involving the musculoskeletal system: Secondary | ICD-10-CM | POA: Diagnosis not present

## 2015-05-01 NOTE — Therapy (Signed)
[facility] [address] [place], [place], [address] Phone: [phone]   Fax:  [phone]  Physical Therapy Treatment  Patient Details  Name: Jeremiah Owens MRN: [id number] Date of Birth: September 08, 1929 Referring Provider:  Wenda Low, MD  Encounter Date: 05/01/2015      PT End of Session - 05/01/15 1302    Visit Number 17   Number of Visits 24  2x/wk for 4 additional weeks   Date for PT Re-Evaluation 05/26/15   Authorization Type KX;  Medicare [employer] - G codes required every 10 visits   PT Start Time 1147   PT Stop Time 1230   PT Time Calculation (min) 43 min   Equipment Utilized During Treatment Gait belt   Activity Tolerance Patient tolerated treatment well   Behavior During Therapy WFL for tasks assessed/performed      Past Medical History  Diagnosis Date  . Compression fracture   . Allergy   . Anxiety   . Thyroid disease   . S/P radiation therapy 08/21/14-09/04/14    T4-6 25Gy/70f  . S/P radiation therapy 11/29/14-12/12/14    rt prox humerus/shoulder/scapula 25Gy/170f . Encounter for antineoplastic chemotherapy 01/31/2015  . Melanoma   . Bone cancer     t_spine T-5  . Prostate cancer 2002  . Skin cancer 1994    melanoma  right neck   . Multiple myeloma   . Hearing loss   . Stroke     Past Surgical History  Procedure Laterality Date  . Hernia repair    . Posterior cervical fusion/foraminotomy N/A 06/17/2014    Procedure: Thoracic five laminectomy for epidural tumor resection Thoracic 4-7 posterior lateral arthrodesis, segmental pedicle screw fixation.;  Surgeon: KyAshok PallMD;  [facility];  Service: Neurosurgery;  Laterality: N/A;    There were no vitals filed for this visit.  Visit Diagnosis:  Abnormality of gait  Unsteadiness  Weakness generalized      Subjective Assessment - 05/01/15 1152    Subjective Pt reports no pain and no falls since last visit.  Also  states using cane at home when he can.     Pertinent History Basal ganglia infarct 02/10/15 with expressive aphasia; prostate cancer, skin cancer, multiple myeloma (active; chemotherapy on Wednesdays)   Limitations Walking;House hold activities   Patient Stated Goals "To be able to function without the walker (rollator); better balance."   Currently in Pain? No/denies                         [facility] PT Treatment/Exercise - 05/01/15 1203    Transfers   Transfers Sit to Stand;Stand to Sit   Sit to Stand 6: Modified independent (Device/Increase time);5: Supervision   Sit to Stand Details (indicate cue type and reason) Performed sit<>stand x 10 reps as in HEP with single UE support then 5x without UE support with cues for increased forward weight shift.     Stand to Sit 6: Modified independent (Device/Increase time)   Ambulation/Gait   Ambulation/Gait Yes   Ambulation/Gait Assistance 5: Supervision;4: Min guard  variable due to LOB.    Ambulation/Gait Assistance Details Cues for safety when negotiating in tight spaces or busy areas    Ambulation Distance (Feet) 120 Feet   Assistive device Straight cane   Gait Pattern Step-through pattern;Decreased hip/knee flexion - right;Decreased dorsiflexion - right;Decreased weight shift to left;Right foot flat;Trunk flexed;Poor foot clearance - right   Ambulation Surface  Level;Indoor   Door Management --   Curb --   Timed Up and Go Test   TUG --   Normal TUG (seconds) --   Self-Care   Self-Care Other Self-Care Comments   Other Self-Care Comments  Performed exercises from current HEP, see pt instruction for details.  Also added 4way hip exercise with red theraband.  See pt instruction for details.  Pt unsure of where he would be able to tie this to in home, therefore educated on tying in knot and securing in door while ensuring he has chair in front of him for balance.  Will follow up on next visit to determine if pt able to do at  home.    Exercises   Exercises Other Exercises   Other Exercises  Performed marching backwards during marching task for increased challenge.                 PT Education - 05/01/15 1256    Education provided Yes   Education Details Added 4way hip to HEP, performed and provided handout   Person(s) Educated Patient   Methods Explanation;Demonstration;Verbal cues;Handout   Comprehension Verbalized understanding;Returned demonstration;Need further instruction          PT Short Term Goals - 04/24/15 1427    PT SHORT TERM GOAL #1   Title Pt will perform home exercises with mod I using paper handout to indicate safe HEP compliance. Target date: 03/26/15   Baseline Met 8/15.   Status Achieved   PT SHORT TERM GOAL #2   Title Perform Berg Balance Scale to assess stability with functional standing balance.  Target date: 03/26/15   Baseline 8/1: score 29/56   Status Achieved   PT SHORT TERM GOAL #3   Title Pt will decrease TUG time from 21.58 to 17.5 seconds or less to indicate improved efficiency of functional mobility.  Target date: 03/26/15   Baseline 9/1: 15.94 sec (average of 3 trials) with rollator   Status Achieved   PT SHORT TERM GOAL #4   Title Pt will negotiate standard curb step with mod I using LRAD to indicate increased safety/independence with community mobility.  Target date: 03/26/15   Baseline Met 8/15.   Status Achieved   PT SHORT TERM GOAL #5   Title Pt will ambulate at self-selected gait speed of 2.63 ft/sec to reach status of community ambulator. Target date: 03/26/15   Baseline 8/15: 2.65 ft/sec with rollator   Status Achieved   PT SHORT TERM GOAL #6   Title Pt will perform sit > stand from standard chair without UE support to indicate increased functional LE strength.  Target date: 03/26/15   Baseline 8/23: sit > stand without UE support with supervision, cueing for full anterior weight shift   Status Partially Met           PT Long Term Goals - 04/26/15  1112    PT LONG TERM GOAL #1   Title Pt/spouse will verbalize understanding of CVA warning signs to indicate understanding of situation in which to seek medical attention. Target date: 04/23/15.   Baseline Met 9/22.   Status Achieved   PT LONG TERM GOAL #2   Title Pt will ambulate x200' over indoor, level surfaces with mod I using SPC to indicate increased pt independence with household mobility, progress toward pt-stated goal. Modified target date: 05/24/15.   Baseline 9/22: supervision required.  Continue goal through extended POC.   Status Not Met   PT LONG TERM GOAL #3  Title Pt will perform TUG test in < 13.5 sconds to indicate decreased risk of falling. Target date: 04/23/15.   Baseline 9/22: 15.05 sec with SPC   Status Achieved   PT LONG TERM GOAL #4   Title Pt will increase Berg Balance Scale score by 6 points from baseline to indicate improvement in functional standing balance. Target date: 04/23/15.   Baseline 8/1: score 29/56 ;   9/20: score 44/56   Status Achieved   PT LONG TERM GOAL #5   Title Pt will ambulate x500' over unlevel, paved surfaces with mod I using single point cane with no overt LOB to indicate increased safety with community mobility, progress toward pt-stated goal of walking with SPC.  Modified target date: 05/24/15.   Baseline Requires supervision to min guard for outdoor gait up to 350'   Status Not Met   PT LONG TERM GOAL #6   Title Pt will negotiate 4 stairs with 1 rail with mod I using LRAD to indicate increased independence with community access. Target date: 04/23/15.   Baseline Met 9/22.   Status Achieved   PT LONG TERM GOAL #7   Title Pt will negotiate standard ramp and curb step with mod I using SPC to indicate safety traversing community obstacles. Modified target date: 05/24/15   Baseline 9/22: requires supervision  Continue goal through extended POC.   Status Not Met   PT LONG TERM GOAL #8   Title Pt will perform floor transfer with mod I using  standard chair to enble pt to safely recover from fall. Target date: 05/24/15.   Status New               Plan - 05/01/15 1303    Clinical Impression Statement Skilled session focused on re-assessment of compliance and performance of HEP.  Pt requires min cues for hamstring stretch.  Added 4 way hip exercise with red theraband for balance and BLE hip strengthening.  See pt instruction for further details.  Continue POC.    Pt will benefit from skilled therapeutic intervention in order to improve on the following deficits Abnormal gait;Decreased balance;Difficulty walking;Decreased strength;Postural dysfunction;Decreased knowledge of use of DME;Decreased activity tolerance;Decreased endurance   Rehab Potential Good   Clinical Impairments Affecting Rehab Potential Currently undergoing chemotherapy 1x/week   PT Frequency 2x / week   PT Duration 4 weeks   PT Treatment/Interventions ADLs/Self Care Home Management;Gait training;Therapeutic exercise;Patient/family education;Stair training;Balance training;Functional mobility training;Neuromuscular re-education;Manual techniques;Therapeutic activities;DME Instruction;Vestibular;Orthotic Fit/Training   PT Next Visit Plan Gait training with SPC in tight spaces, around objects, turns, check ability to perform 4way hip at home   PT Home Exercise Plan see pt instruction   Consulted and Agree with Plan of Care Patient        Problem List Patient Active Problem List   Diagnosis Date Noted  . Diarrhea 04/25/2015  . Cancer associated pain 04/25/2015  . Long term current use of anticoagulant therapy 04/25/2015  . Basal ganglia infarction 02/12/2015  . Dizziness   . Expressive aphasia   . Past pointing   . Right leg weakness   . Acute ischemic stroke 02/11/2015  . CVA (cerebral infarction) 02/11/2015  . Aspiration pneumonia 02/11/2015  . Aphasia   . Difficulty speaking   . Speech difficult to understand   . Stroke   . Encounter for  antineoplastic chemotherapy 01/31/2015  . Constipation 12/20/2014  . Rash 12/20/2014  . Cellulitis 12/06/2014  . Peripheral edema 12/06/2014  . Gait disorder 09/05/2014  .  Multiple myeloma   . Paraplegia 06/21/2014  . Postoperative anemia due to acute blood loss 06/21/2014  . 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 Spine metastasis 06/17/2014  . Thoracic spine tumor 06/17/2014  . Metastatic bone cancer 06/15/2014    Cameron Sprang, PT, MPT [facility] [address] [place], [place], [address] Phone: [phone]   Fax:  [phone] 05/01/2015, 1:06 PM

## 2015-05-01 NOTE — Patient Instructions (Signed)
  Please complete the assigned speech therapy homework and return it to your next session.  

## 2015-05-01 NOTE — Therapy (Signed)
[facility] [address] [place], [place], [address] Phone: [phone]   Fax:  [phone]  Speech Language Pathology Treatment  Patient Details  Name: Jeremiah Owens MRN: [id number] Date of Birth: 07-27-1930 Referring Provider:  Wenda Low, MD  Encounter Date: 05/01/2015      End of Session - 05/01/15 1156    Visit Number 16   Number of Visits 24   Date for SLP Re-Evaluation 05/24/15   SLP Start Time 68   SLP Stop Time  1146   SLP Time Calculation (min) 44 min   Activity Tolerance Patient tolerated treatment well      Past Medical History  Diagnosis Date  . Compression fracture   . Allergy   . Anxiety   . Thyroid disease   . S/P radiation therapy 08/21/14-09/04/14    T4-6 25Gy/46f  . S/P radiation therapy 11/29/14-12/12/14    rt prox humerus/shoulder/scapula 25Gy/135f . Encounter for antineoplastic chemotherapy 01/31/2015  . Melanoma   . Bone cancer     t_spine T-5  . Prostate cancer 2002  . Skin cancer 1994    melanoma  right neck   . Multiple myeloma   . Hearing loss   . Stroke     Past Surgical History  Procedure Laterality Date  . Hernia repair    . Posterior cervical fusion/foraminotomy N/A 06/17/2014    Procedure: Thoracic five laminectomy for epidural tumor resection Thoracic 4-7 posterior lateral arthrodesis, segmental pedicle screw fixation.;  Surgeon: KyAshok PallMD;  [facility];  Service: Neurosurgery;  Laterality: N/A;    There were no vitals filed for this visit.  Visit Diagnosis: Expressive aphasia      Subjective Assessment - 05/01/15 1107    Subjective "I should know her - she's kin to me - - but I  - couldn't bring it up." (pt, re: seeing someone in waiting room)               ADULT SLP TREATMENT - 05/01/15 1108    General Information   Behavior/Cognition Alert;Cooperative;Pleasant mood   Treatment Provided   Treatment provided  Cognitive-Linquistic   Pain Assessment   Pain Assessment No/denies pain   Cognitive-Linquistic Treatment   Treatment focused on Aphasia   Skilled Treatment Pt reports he believes homework has been helpful in carrying over skills to spontaneous speech. SLP facilitated pt's verbal expression today by giving pt diviergent naming activities in order to foster/reconnect linguisti/semantic connections between words. Pt req'd mod A usually after average 3.5 words in a simple to mod ocmplex category (bedroom furniture, [place], etc.).    Assessment / Recommendations / Plan   Plan Continue with current plan of care   Progression Toward Goals   Progression toward goals Not progressing toward goals (comment)  pt's success looks much like one week ago            SLP Short Term Goals - 04/26/15 1055    SLP SHORT TERM GOAL #1   Title pt will produce sentence responses in mod complex tasks 80% and rare min A   Status Not Met   SLP SHORT TERM GOAL #2   Title pt will demo HEP for swallowing with rare min A   Status Achieved   SLP SHORT TERM GOAL #3   Title pt will demo compensatory strategies for anomia with occasasional min A   Status Not Met   SLP SHORT TERM GOAL #4  Title Pt's signature/ basic demographic info will not show any signs of aphasia   Status Partially Met          SLP Long Term Goals - 05/01/15 1157    SLP LONG TERM GOAL #1   Title pt will demo 10 minutes mod complex conversation fucntionally with occasional verbal cues   Baseline Continue goal upon renewal 04/30/15  to 06/03/15   Time 4   Period Weeks   Status Revised   SLP LONG TERM GOAL #2   Title pt will demo HEP for swallowing with modified independence   Baseline Continue goal upon renewal 04/30/15 to 06/03/15   Time 4   Period Weeks   Status On-going   SLP LONG TERM GOAL #3   Title pt will write checks adequately with rare min cues   Baseline Continue goal upon renewal 04/30/15 to  06/03/15   Time 4   Period Weeks   Status On-going          Plan - 05/01/15 1156    Clinical Impression Statement Pt requires continued skilled ST to maximize verbal expression and written expression as aphasia persists requiring min to mod A to participate in conversation due to aphasia. . Recommend ST continue for the near future. Pt's progress may be beginning to slow.   Speech Therapy Frequency 2x / week   Duration --  3 weeks   Treatment/Interventions Internal/external aids;Compensatory strategies;SLP instruction and feedback;Patient/family education;Functional tasks;Cueing hierarchy;Pharyngeal strengthening exercises;Oral motor exercises;Compensatory techniques;Diet toleration management by SLP   Potential to Achieve Goals Fair   Potential Considerations Severity of impairments        Problem List Patient Active Problem List   Diagnosis Date Noted  . Diarrhea 04/25/2015  . Cancer associated pain 04/25/2015  . Long term current use of anticoagulant therapy 04/25/2015  . Basal ganglia infarction 02/12/2015  . Dizziness   . Expressive aphasia   . Past pointing   . Right leg weakness   . Acute ischemic stroke 02/11/2015  . CVA (cerebral infarction) 02/11/2015  . Aspiration pneumonia 02/11/2015  . Aphasia   . Difficulty speaking   . Speech difficult to understand   . Stroke   . Encounter for antineoplastic chemotherapy 01/31/2015  . Constipation 12/20/2014  . Rash 12/20/2014  . Cellulitis 12/06/2014  . Peripheral edema 12/06/2014  . Gait disorder 09/05/2014  . Multiple myeloma   . Paraplegia 06/21/2014  . Postoperative anemia due to acute blood loss 06/21/2014  . 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 Spine metastasis 06/17/2014  . Thoracic spine tumor 06/17/2014  . Metastatic bone cancer 06/15/2014    [facility] , MS, CCC-SLP  05/01/2015, 11:59 AM  [facility] [address] [place],  [place], [address] Phone: [phone]   Fax:  [phone]

## 2015-05-01 NOTE — Patient Instructions (Addendum)
4 way hip exercises:  Tie red band to sturdy couch or tie knot and place in door (make sure you have chair to hold onto for support).  Perform each direction listed below 10 times each then switch legs and do all 4 directions on the other leg.  Make sure you stand tall and keep knee straight.  Move SLOWLY!!      Flexion and Extension - Standing   Facing away from band, move leg out ahead of you as above x 10 reps.   Facing towards band, move leg behind you keeping knee straight and try to not touch foot in between reps.  Perform x 10 reps.   (Clinic) Balance: With Hip Abduction - Unilateral Stance (Resist)   Holding onto chair or sturdy object, Swing leg out to side. Use arm support only as needed for balance. Repeat __10__ times per set on each leg. Do __1__ sets per session. Do ____ sessions per week.  Copyright  VHI. All rights reserved.   (Clinic) Balance: With Hip Adduction - Unilateral Stance (Resist)   Swing foot across body and beyond.  You will have to move opposite leg behind you slightly to avoid hitting.  Use arm support for balance. Repeat _10___ times per set. Do __1__ sets per session.  Copyright  VHI. All rights reserved.   Leg Extension (Hamstring)   Sit toward front edge of chair, with leg out straight, heel on floor, toes pointing toward body. Hold for 60 seconds. Relax. Repeat on other leg.  Do this twice per day on each leg.  HIP / KNEE: Extension - Sit to Stand   Stand up from standard chair using only one hand.. Be sure your belly button is forward (as we discussed during PT session) before starting. Also, be sure you're leaning forward ("nose over knees") to stand. Do 10 stands, 3 times per day.     Walking with high knees   Stand with one hand on your kitchen countertop. Walk the length of the countertop with knees high (as though you're marching). Make sure your ankle is also positioned such that your toes are pointing upward when you take a step.  Walk the length of your countertop this way 6-8 times. Perform 3 times per day.

## 2015-05-02 ENCOUNTER — Ambulatory Visit (HOSPITAL_BASED_OUTPATIENT_CLINIC_OR_DEPARTMENT_OTHER): Payer: Medicare Other

## 2015-05-02 ENCOUNTER — Other Ambulatory Visit (HOSPITAL_BASED_OUTPATIENT_CLINIC_OR_DEPARTMENT_OTHER): Payer: Medicare Other

## 2015-05-02 ENCOUNTER — Telehealth: Payer: Self-pay | Admitting: Internal Medicine

## 2015-05-02 ENCOUNTER — Telehealth: Payer: Self-pay | Admitting: *Deleted

## 2015-05-02 VITALS — BP 129/60 | HR 79 | Temp 98.4°F | Resp 18

## 2015-05-02 DIAGNOSIS — Z5112 Encounter for antineoplastic immunotherapy: Secondary | ICD-10-CM | POA: Diagnosis present

## 2015-05-02 DIAGNOSIS — C9000 Multiple myeloma not having achieved remission: Secondary | ICD-10-CM | POA: Diagnosis present

## 2015-05-02 LAB — CBC WITH DIFFERENTIAL/PLATELET
BASO%: 0.8 % (ref 0.0–2.0)
Basophils Absolute: 0 10*3/uL (ref 0.0–0.1)
EOS ABS: 0.2 10*3/uL (ref 0.0–0.5)
EOS%: 5.8 % (ref 0.0–7.0)
HCT: 37 % — ABNORMAL LOW (ref 38.4–49.9)
HGB: 12.5 g/dL — ABNORMAL LOW (ref 13.0–17.1)
LYMPH%: 15.1 % (ref 14.0–49.0)
MCH: 33.3 pg (ref 27.2–33.4)
MCHC: 33.7 g/dL (ref 32.0–36.0)
MCV: 98.7 fL — AB (ref 79.3–98.0)
MONO#: 0.5 10*3/uL (ref 0.1–0.9)
MONO%: 16.6 % — ABNORMAL HIGH (ref 0.0–14.0)
NEUT#: 1.8 10*3/uL (ref 1.5–6.5)
NEUT%: 61.7 % (ref 39.0–75.0)
PLATELETS: 121 10*3/uL — AB (ref 140–400)
RBC: 3.75 10*6/uL — AB (ref 4.20–5.82)
RDW: 17.1 % — ABNORMAL HIGH (ref 11.0–14.6)
WBC: 3 10*3/uL — ABNORMAL LOW (ref 4.0–10.3)
lymph#: 0.4 10*3/uL — ABNORMAL LOW (ref 0.9–3.3)

## 2015-05-02 LAB — COMPREHENSIVE METABOLIC PANEL (CC13)
ALT: 23 U/L (ref 0–55)
ANION GAP: 6 meq/L (ref 3–11)
AST: 18 U/L (ref 5–34)
Albumin: 3.6 g/dL (ref 3.5–5.0)
Alkaline Phosphatase: 54 U/L (ref 40–150)
BILIRUBIN TOTAL: 0.74 mg/dL (ref 0.20–1.20)
BUN: 18 mg/dL (ref 7.0–26.0)
CALCIUM: 8.8 mg/dL (ref 8.4–10.4)
CHLORIDE: 105 meq/L (ref 98–109)
CO2: 29 meq/L (ref 22–29)
Creatinine: 0.9 mg/dL (ref 0.7–1.3)
EGFR: 77 mL/min/{1.73_m2} — AB (ref >90)
Glucose: 91 mg/dl (ref 70–140)
Potassium: 4 mEq/L (ref 3.5–5.1)
Sodium: 140 mEq/L (ref 136–145)
TOTAL PROTEIN: 5.5 g/dL — AB (ref 6.4–8.3)

## 2015-05-02 MED ORDER — BORTEZOMIB CHEMO SQ INJECTION 3.5 MG (2.5MG/ML)
1.3000 mg/m2 | Freq: Once | INTRAMUSCULAR | Status: AC
  Administered 2015-05-02: 2.5 mg via SUBCUTANEOUS
  Filled 2015-05-02: qty 2.5

## 2015-05-02 MED ORDER — ONDANSETRON HCL 8 MG PO TABS
ORAL_TABLET | ORAL | Status: AC
  Filled 2015-05-02: qty 1

## 2015-05-02 MED ORDER — ONDANSETRON 8 MG PO TBDP
8.0000 mg | ORAL_TABLET | Freq: Once | ORAL | Status: AC
  Administered 2015-05-02: 8 mg via ORAL
  Filled 2015-05-02: qty 1

## 2015-05-02 NOTE — Telephone Encounter (Signed)
pt came in to get updated sch-sent Dr Julien Nordmann an email to advise no pof put in for next appt-sent MW email to sch trmt-will call pt once reply

## 2015-05-02 NOTE — Telephone Encounter (Signed)
Per staff message and POF I have scheduled appts. Advised scheduler of appts. JMW  

## 2015-05-02 NOTE — Patient Instructions (Signed)
[facility] Discharge Instructions for Patients Receiving Chemotherapy  Today you received the following chemotherapy agents VELCADE  To help prevent nausea and vomiting after your treatment, we encourage you to take your nausea medication   If you develop nausea and vomiting that is not controlled by your nausea medication, call the clinic.   BELOW ARE SYMPTOMS THAT SHOULD BE REPORTED IMMEDIATELY:  *FEVER GREATER THAN 100.5 F  *CHILLS WITH OR WITHOUT FEVER  NAUSEA AND VOMITING THAT IS NOT CONTROLLED WITH YOUR NAUSEA MEDICATION  *UNUSUAL SHORTNESS OF BREATH  *UNUSUAL BRUISING OR BLEEDING  TENDERNESS IN MOUTH AND THROAT WITH OR WITHOUT PRESENCE OF ULCERS  *URINARY PROBLEMS  *BOWEL PROBLEMS   UNUSUAL RASH Items with * indicate a potential emergency and should be followed up as soon as possible.  Feel free to call the clinic should you have any questions or concerns. The clinic phone number is [phone].  Please show the [facility] at check-in to the Emergency Department and triage nurse.     Thrombocytopenia Thrombocytopenia means there are not enough platelets in your blood. Platelets are tiny cells in your blood. When you start bleeding, platelets clump together around the cut or injury to stop the bleeding. This process is called blood clotting. Not having enough platelets can cause bleeding problems. HOME CARE  Check your skin and inside your mouth for bruises or blood as told by your doctor.  Check your spit (sputum), pee (urine), and poop (stool) for blood as told by your doctor.  Do not do activities that can cause bumps or bruises until your doctor says it is okay.  Be careful not to cut yourself when you shave or use scissors, needles, knives, or other tools.  Be careful not to burn yourself when you iron or cook.  Ask your doctor if you can drink alcohol.  Only take medicines as told by your doctor.  Tell all your doctors and  your dentist that you have this bleeding problem. GET HELP RIGHT AWAY IF:  You are bleeding anywhere on your body.  You are bleeding or have bruises without knowing why.  You have blood in your spit, pee, or poop. MAKE SURE YOU:  Understand these instructions.  Will watch your condition.  Will get help right away if you are not doing well or get worse. Document Released: 07/10/2011 Document Revised: 10/13/2011 Document Reviewed: 07/10/2011 [facility] Patient Information 2015 [place], [place]. This information is not intended to replace advice given to you by your health care provider. Make sure you discuss any questions you have with your health care provider.

## 2015-05-03 ENCOUNTER — Encounter: Payer: Self-pay | Admitting: Rehabilitation

## 2015-05-03 ENCOUNTER — Other Ambulatory Visit: Payer: Self-pay | Admitting: Internal Medicine

## 2015-05-03 ENCOUNTER — Ambulatory Visit: Payer: Medicare Other | Admitting: Rehabilitation

## 2015-05-03 ENCOUNTER — Ambulatory Visit: Payer: Medicare Other

## 2015-05-03 ENCOUNTER — Telehealth: Payer: Self-pay | Admitting: *Deleted

## 2015-05-03 DIAGNOSIS — R29898 Other symptoms and signs involving the musculoskeletal system: Secondary | ICD-10-CM | POA: Diagnosis not present

## 2015-05-03 DIAGNOSIS — R4701 Aphasia: Secondary | ICD-10-CM | POA: Diagnosis not present

## 2015-05-03 DIAGNOSIS — M6281 Muscle weakness (generalized): Secondary | ICD-10-CM

## 2015-05-03 DIAGNOSIS — R269 Unspecified abnormalities of gait and mobility: Secondary | ICD-10-CM | POA: Diagnosis not present

## 2015-05-03 DIAGNOSIS — R2681 Unsteadiness on feet: Secondary | ICD-10-CM

## 2015-05-03 DIAGNOSIS — I69359 Hemiplegia and hemiparesis following cerebral infarction affecting unspecified side: Secondary | ICD-10-CM | POA: Diagnosis not present

## 2015-05-03 NOTE — Therapy (Signed)
[facility] [address] [place], [place], [address] Phone: [phone]   Fax:  [phone]  Physical Therapy Treatment  Patient Details  Name: Jeremiah Owens MRN: [id number] Date of Birth: 1929-11-20 Referring Provider:  Wenda Low, MD  Encounter Date: 05/03/2015      PT End of Session - 05/03/15 0911    Visit Number 18   Number of Visits 24  2x/wk for 4 additional weeks   Date for PT Re-Evaluation 05/26/15   Authorization Type KX;  Medicare [employer] - G codes required every 10 visits   PT Start Time 0802   PT Stop Time 0846   PT Time Calculation (min) 44 min   Equipment Utilized During Treatment Gait belt   Activity Tolerance Patient tolerated treatment well   Behavior During Therapy WFL for tasks assessed/performed      Past Medical History  Diagnosis Date  . Compression fracture   . Allergy   . Anxiety   . Thyroid disease   . S/P radiation therapy 08/21/14-09/04/14    T4-6 25Gy/65f  . S/P radiation therapy 11/29/14-12/12/14    rt prox humerus/shoulder/scapula 25Gy/146f . Encounter for antineoplastic chemotherapy 01/31/2015  . Melanoma   . Bone cancer     t_spine T-5  . Prostate cancer 2002  . Skin cancer 1994    melanoma  right neck   . Multiple myeloma   . Hearing loss   . Stroke     Past Surgical History  Procedure Laterality Date  . Hernia repair    . Posterior cervical fusion/foraminotomy N/A 06/17/2014    Procedure: Thoracic five laminectomy for epidural tumor resection Thoracic 4-7 posterior lateral arthrodesis, segmental pedicle screw fixation.;  Surgeon: KyAshok PallMD;  [facility];  Service: Neurosurgery;  Laterality: N/A;    There were no vitals filed for this visit.  Visit Diagnosis:  Unsteadiness  Abnormality of gait  Weakness of left leg  Generalized muscle weakness      Subjective Assessment - 05/03/15 0807    Subjective No changes since last visit.   No falls, but had busy day yesterday so did not get to perform full HEP.  States that he did figure out where he can attach theraband.    Pertinent History Basal ganglia infarct 02/10/15 with expressive aphasia; prostate cancer, skin cancer, multiple myeloma (active; chemotherapy on Wednesdays)   Limitations Walking;House hold activities   Patient Stated Goals "To be able to function without the walker (rollator); better balance."   Currently in Pain? No/denies                         [facility]Exercise - 05[facility]03/15 0820    Transfers   Transfers Sit to Stand;Stand to Sit   Sit to Stand 6: Modified independent (Device/Increase time);5: Supervision   Stand to Sit 6: Modified independent (Device/Increase time)   Ambulation/Gait   Ambulation/Gait Yes   Ambulation/Gait Assistance 4: Min assist;4: Min guard  variable due to LOB.    Ambulation/Gait Assistance Details Continues to require physical assist at times when making turns due to not ensuring safe foot placement when negotiating around obstacles.  For example, when turning to sit with rollator, he did not bring rollator with him but instead locked brakes to the side, one foot on either side of rollator wheel and had LOB sitting into chair.  Provided max education to ensure rollator with him when backing up to  chair, getting both legs to back of chair then sitting.    Ambulation Distance (Feet) 55 Feet  x 3 reps, then another 66'   Assistive device Straight cane   Gait Pattern Step-through pattern;Decreased hip/knee flexion - right;Decreased dorsiflexion - right;Decreased weight shift to left;Right foot flat;Trunk flexed;Poor foot clearance - right;Shuffle;Scissoring   Ambulation Surface Level;Indoor   Ramp 4: Min assist  w/ SPC   Ramp Details (indicate cue type and reason) Requires demonstration and verbal cues for sequencing and placement of SPC.     Curb 4: Min assist  with [facility]   Curb Details (indicate cue  type and reason) Performed x 3 reps with SPC in order to assess stepping sequence and safety.  Note that he requires min/guard to min A for safety when ascending, but S when descending with cues for foot placement when ascending to avoid foot being too close to edge of curb.    High Level Balance   High Level Balance Comments Performed gait with SPC while negotiating over and around obstacles to better simulate home and pt stated difficulty using cane at home.  Pt did well stepping over obstacle, however when negotating around orange barriers, note that pt tends to get very narrow BOS, steps become very shuffled and he tends to let feet get too close to obstacle causing LOB and difficulty placing cane.   Provided demonstration and verbal cues for safe negotiation, taking wider turns, stepping sideways if needed to avoid getting tangled in obstacle and taking larger steps.  Pt returned demonstration x 2 reps down and back with continued cues but mild improvement.  Then performed with SPC while holding plate of various objects to better simulate carrying plate in kitchen.  Requires min/guard to min A for safety with continued cues as above.  Pt then states that he sometimes ambulates while holding plate without use of cane.  Attempted this in session, however balance very poor and tends to have even more shuffled and narrow BOS.  Educated to use [facility] for all ambulation inside of house.   Pt verbalized understanding.     Self-Care   Self-Care --   Other Self-Care Comments  --   Exercises   Exercises --   Other Exercises  --                PT Education - 05/03/15 [phone]    Education provided Yes   Education Details Provided education on use of SPC in house   Person(s) Educated Patient   Methods Explanation   Comprehension Verbalized understanding          PT Short Term Goals - 04/24/15 1427    PT SHORT TERM GOAL #1   Title Pt will perform home exercises with mod I using paper handout to  indicate safe HEP compliance. Target date: 03/26/15   Baseline Met 8/15.   Status Achieved   PT SHORT TERM GOAL #2   Title Perform Berg Balance Scale to assess stability with functional standing balance.  Target date: 03/26/15   Baseline 8/1: score 29/56   Status Achieved   PT SHORT TERM GOAL #3   Title Pt will decrease TUG time from 21.58 to 17.5 seconds or less to indicate improved efficiency of functional mobility.  Target date: 03/26/15   Baseline 9/1: 15.94 sec (average of 3 trials) with rollator   Status Achieved   PT SHORT TERM GOAL #4   Title Pt will negotiate standard curb step with mod I using  LRAD to indicate increased safety/independence with community mobility.  Target date: 03/26/15   Baseline Met 8/15.   Status Achieved   PT SHORT TERM GOAL #5   Title Pt will ambulate at self-selected gait speed of 2.63 ft/sec to reach status of community ambulator. Target date: 03/26/15   Baseline 8/15: 2.65 ft/sec with rollator   Status Achieved   PT SHORT TERM GOAL #6   Title Pt will perform sit > stand from standard chair without UE support to indicate increased functional LE strength.  Target date: 03/26/15   Baseline 8/23: sit > stand without UE support with supervision, cueing for full anterior weight shift   Status Partially Met           PT Long Term Goals - 04/26/15 1112    PT LONG TERM GOAL #1   Title Pt/spouse will verbalize understanding of CVA warning signs to indicate understanding of situation in which to seek medical attention. Target date: 04/23/15.   Baseline Met 9/22.   Status Achieved   PT LONG TERM GOAL #2   Title Pt will ambulate x200' over indoor, level surfaces with mod I using SPC to indicate increased pt independence with household mobility, progress toward pt-stated goal. Modified target date: 05/24/15.   Baseline 9/22: supervision required.  Continue goal through extended POC.   Status Not Met   PT LONG TERM GOAL #3   Title Pt will perform TUG test in <  13.5 sconds to indicate decreased risk of falling. Target date: 04/23/15.   Baseline 9/22: 15.05 sec with SPC   Status Achieved   PT LONG TERM GOAL #4   Title Pt will increase Berg Balance Scale score by 6 points from baseline to indicate improvement in functional standing balance. Target date: 04/23/15.   Baseline 8/1: score 29/56 ;   9/20: score 44/56   Status Achieved   PT LONG TERM GOAL #5   Title Pt will ambulate x500' over unlevel, paved surfaces with mod I using single point cane with no overt LOB to indicate increased safety with community mobility, progress toward pt-stated goal of walking with SPC.  Modified target date: 05/24/15.   Baseline Requires supervision to min guard for outdoor gait up to 350'   Status Not Met   PT LONG TERM GOAL #6   Title Pt will negotiate 4 stairs with 1 rail with mod I using LRAD to indicate increased independence with community access. Target date: 04/23/15.   Baseline Met 9/22.   Status Achieved   PT LONG TERM GOAL #7   Title Pt will negotiate standard ramp and curb step with mod I using SPC to indicate safety traversing community obstacles. Modified target date: 05/24/15   Baseline 9/22: requires supervision  Continue goal through extended POC.   Status Not Met   PT LONG TERM GOAL #8   Title Pt will perform floor transfer with mod I using standard chair to enble pt to safely recover from fall. Target date: 05/24/15.   Status New               Plan - 05/03/15 0912    Clinical Impression Statement Skilled session focused on gait with [facility] negotiating around and over obstacles to better simulate home environment.  Also added challenge of carrying plate of objects to simulate pt ambulating in kitchen.  Note that pt continues to have difficulty making turns and has narrow BOS with short shuffled steps and tends to get feet too close to object  negotating around.  Will continue to address.     Pt will benefit from skilled therapeutic intervention in  order to improve on the following deficits Abnormal gait;Decreased balance;Difficulty walking;Decreased strength;Postural dysfunction;Decreased knowledge of use of DME;Decreased activity tolerance;Decreased endurance   Rehab Potential Good   Clinical Impairments Affecting Rehab Potential Currently undergoing chemotherapy 1x/week   PT Frequency 2x / week   PT Duration 4 weeks   PT Treatment/Interventions ADLs/Self Care Home Management;Gait training;Therapeutic exercise;Patient/family education;Stair training;Balance training;Functional mobility training;Neuromuscular re-education;Manual techniques;Therapeutic activities;DME Instruction;Vestibular;Orthotic Fit/Training   PT Next Visit Plan Gait training with SPC in tight spaces, around objects, turns, floor transfer, curb/ramp with SPC, outside with cane as able   Consulted and Agree with Plan of Care Patient        Problem List Patient Active Problem List   Diagnosis Date Noted  . Diarrhea 04/25/2015  . Cancer associated pain 04/25/2015  . Long term current use of anticoagulant therapy 04/25/2015  . Basal ganglia infarction 02/12/2015  . Dizziness   . Expressive aphasia   . Past pointing   . Right leg weakness   . Acute ischemic stroke 02/11/2015  . CVA (cerebral infarction) 02/11/2015  . Aspiration pneumonia 02/11/2015  . Aphasia   . Difficulty speaking   . Speech difficult to understand   . Stroke   . Encounter for antineoplastic chemotherapy 01/31/2015  . Constipation 12/20/2014  . Rash 12/20/2014  . Cellulitis 12/06/2014  . Peripheral edema 12/06/2014  . Gait disorder 09/05/2014  . Multiple myeloma   . Paraplegia 06/21/2014  . Postoperative anemia due to acute blood loss 06/21/2014  . 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 Spine metastasis 06/17/2014  . Thoracic spine tumor 06/17/2014  . Metastatic bone cancer 06/15/2014    Cameron Sprang, PT, MPT [facility] [address] [place], [place], [address] Phone: [phone]   Fax:  [phone] 05/03/2015, 9:16 AM

## 2015-05-03 NOTE — Telephone Encounter (Signed)
Per staff message and POF I have scheduled appts. Advised scheduler of appts and to move lab appts  JMW  

## 2015-05-03 NOTE — Therapy (Signed)
[facility] [address] [place], [place], [address] Phone: [phone]   Fax:  [phone]  Speech Language Pathology Treatment  Patient Details  Name: Jeremiah Owens MRN: [id number] Date of Birth: 1929/12/27 Referring Provider:  Wenda Low, MD  Encounter Date: 05/03/2015      End of Session - 05/03/15 1505    Visit Number 17   Number of Visits 24   Date for SLP Re-Evaluation 05/24/15   Authorization Type G-[id number]!    SLP Start Time [phone]   SLP Stop Time  0931   SLP Time Calculation (min) 44 min   Activity Tolerance Patient tolerated treatment well      Past Medical History  Diagnosis Date  . Compression fracture   . Allergy   . Anxiety   . Thyroid disease   . S/P radiation therapy 08/21/14-09/04/14    T4-6 25Gy/46f  . S/P radiation therapy 11/29/14-12/12/14    rt prox humerus/shoulder/scapula 25Gy/126f . Encounter for antineoplastic chemotherapy 01/31/2015  . Melanoma   . Bone cancer     t_spine T-5  . Prostate cancer 2002  . Skin cancer 1994    melanoma  right neck   . Multiple myeloma   . Hearing loss   . Stroke     Past Surgical History  Procedure Laterality Date  . Hernia repair    . Posterior cervical fusion/foraminotomy N/A 06/17/2014    Procedure: Thoracic five laminectomy for epidural tumor resection Thoracic 4-7 posterior lateral arthrodesis, segmental pedicle screw fixation.;  Surgeon: KyAshok PallMD;  [facility];  Service: Neurosurgery;  Laterality: N/A;    There were no vitals filed for this visit.  Visit Diagnosis: Expressive aphasia      Subjective Assessment - 05/03/15 0856    Subjective Pt explained his difficulty in getting homework done yesterday with church reponsibilities, with rare SLP questioning cues.               ADULT SLP TREATMENT - 05/03/15 0922    General Information   Behavior/Cognition Alert;Cooperative;Pleasant mood   Treatment Provided    Treatment provided Cognitive-Linquistic   Dysphagia Treatment   Other treatment/comments pt tells SLP he does not cough regularly; pt does not recall to turn head right with POs   Pain Assessment   Pain Assessment No/denies pain   Cognitive-Linquistic Treatment   Treatment focused on Aphasia   Skilled Treatment SLP engaged pt using skilled observation and direct intervention with homework tasks with mod A occasionally.  SLP explained to pt that if measurable progress seen to cont to end of this plan of care ST can cont. SLP encouraged pt to complete crossword puzzles and explained that pt's linguistic semantic connections needed "re-connecting" and provided examples.   Assessment / Recommendations / Plan   Plan Continue with current plan of care   Dysphagia Recommendations   Diet recommendations Other(comment)  as recommended on modified barium swallow   Progression Toward Goals   Progression toward goals Progressing toward goals  pt is getting more fluent during conversation than last week          SLP Education - 05/03/15 1505    Education provided Yes   Education Details educaiton re: linguistic connections (semantically)   Person(s) Educated Patient   Methods Explanation;Demonstration   Comprehension Verbalized understanding          SLP Short Term Goals - 04/26/15 1055    SLP SHORT TERM GOAL #1   Title pt  will produce sentence responses in mod complex tasks 80% and rare min A   Status Not Met   SLP SHORT TERM GOAL #2   Title pt will demo HEP for swallowing with rare min A   Status Achieved   SLP SHORT TERM GOAL #3   Title pt will demo compensatory strategies for anomia with occasasional min A   Status Not Met   SLP SHORT TERM GOAL #4   Title Pt's signature/ basic demographic info will not show any signs of aphasia   Status Partially Met          SLP Long Term Goals - 05/03/15 1507    SLP LONG TERM GOAL #1   Title pt will demo 10 minutes mod complex  conversation fucntionally with occasional verbal cues   Baseline Continue goal upon renewal 04/30/15  to 06/03/15   Time 3   Period Weeks   Status Revised   SLP LONG TERM GOAL #2   Title pt will demo HEP for swallowing with modified independence   Baseline Continue goal upon renewal 04/30/15 to 06/03/15   Time 3   Period Weeks   Status On-going   SLP LONG TERM GOAL #3   Title pt will write checks adequately with rare min cues   Baseline Continue goal upon renewal 04/30/15 to 06/03/15   Time 3   Period Weeks   Status On-going          Plan - 05/03/15 1506    Clinical Impression Statement Pt requires continued skilled ST to maximize verbal expression and written expression as aphasia persists requiring min to mod A to participate in conversation due to aphasia. . Recommend ST continue for the near future. Pt's progress may be beginning to slow.   Speech Therapy Frequency 2x / week   Duration --  3 weeks   Treatment/Interventions Internal/external aids;Compensatory strategies;SLP instruction and feedback;Patient/family education;Functional tasks;Cueing hierarchy;Pharyngeal strengthening exercises;Oral motor exercises;Compensatory techniques;Diet toleration management by SLP   Potential to Achieve Goals Fair   Potential Considerations Severity of impairments        Problem List Patient Active Problem List   Diagnosis Date Noted  . Diarrhea 04/25/2015  . Cancer associated pain 04/25/2015  . Long term current use of anticoagulant therapy 04/25/2015  . Basal ganglia infarction 02/12/2015  . Dizziness   . Expressive aphasia   . Past pointing   . Right leg weakness   . Acute ischemic stroke 02/11/2015  . CVA (cerebral infarction) 02/11/2015  . Aspiration pneumonia 02/11/2015  . Aphasia   . Difficulty speaking   . Speech difficult to understand   . Stroke   . Encounter for antineoplastic chemotherapy 01/31/2015  . Constipation 12/20/2014  . Rash 12/20/2014  . Cellulitis  12/06/2014  . Peripheral edema 12/06/2014  . Gait disorder 09/05/2014  . Multiple myeloma   . Paraplegia 06/21/2014  . Postoperative anemia due to acute blood loss 06/21/2014  . 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 Spine metastasis 06/17/2014  . Thoracic spine tumor 06/17/2014  . Metastatic bone cancer 06/15/2014    [facility] , MS, CCC-SLP  05/03/2015, 3:09 PM  [facility] [address] [place], [place], [address] Phone: [phone]   Fax:  [phone]

## 2015-05-08 ENCOUNTER — Telehealth: Payer: Self-pay | Admitting: Internal Medicine

## 2015-05-08 ENCOUNTER — Ambulatory Visit: Payer: Medicare Other | Attending: Physical Medicine & Rehabilitation

## 2015-05-08 ENCOUNTER — Other Ambulatory Visit: Payer: Self-pay | Admitting: Nurse Practitioner

## 2015-05-08 ENCOUNTER — Encounter: Payer: Self-pay | Admitting: Rehabilitation

## 2015-05-08 ENCOUNTER — Ambulatory Visit: Payer: Medicare Other | Admitting: Rehabilitation

## 2015-05-08 DIAGNOSIS — R269 Unspecified abnormalities of gait and mobility: Secondary | ICD-10-CM | POA: Insufficient documentation

## 2015-05-08 DIAGNOSIS — R4701 Aphasia: Secondary | ICD-10-CM

## 2015-05-08 DIAGNOSIS — R1313 Dysphagia, pharyngeal phase: Secondary | ICD-10-CM | POA: Diagnosis not present

## 2015-05-08 DIAGNOSIS — R2681 Unsteadiness on feet: Secondary | ICD-10-CM

## 2015-05-08 DIAGNOSIS — M6281 Muscle weakness (generalized): Secondary | ICD-10-CM | POA: Diagnosis not present

## 2015-05-08 DIAGNOSIS — R29898 Other symptoms and signs involving the musculoskeletal system: Secondary | ICD-10-CM | POA: Diagnosis not present

## 2015-05-08 DIAGNOSIS — I69359 Hemiplegia and hemiparesis following cerebral infarction affecting unspecified side: Secondary | ICD-10-CM | POA: Diagnosis not present

## 2015-05-08 DIAGNOSIS — R2991 Unspecified symptoms and signs involving the musculoskeletal system: Secondary | ICD-10-CM | POA: Insufficient documentation

## 2015-05-08 DIAGNOSIS — R531 Weakness: Secondary | ICD-10-CM

## 2015-05-08 NOTE — Therapy (Signed)
[facility] [address] [place], [place], [address] Phone: [phone]   Fax:  [phone]  Physical Therapy Treatment  Patient Details  Name: Jeremiah Owens MRN: [id number] Date of Birth: May 28, 1930 Referring Provider:  Wenda Low, MD  Encounter Date: 05/08/2015      PT End of Session - 05/08/15 1327    Visit Number 19   Number of Visits 24  2x/wk for 4 additional weeks   Authorization Type KX;  Medicare [employer] - G codes required every 10 visits   PT Start Time 0845   PT Stop Time 0930   PT Time Calculation (min) 45 min   Equipment Utilized During Treatment Gait belt   Activity Tolerance Patient tolerated treatment well   Behavior During Therapy WFL for tasks assessed/performed      Past Medical History  Diagnosis Date  . Compression fracture   . Allergy   . Anxiety   . Thyroid disease   . S/P radiation therapy 08/21/14-09/04/14    T4-6 25Gy/28f  . S/P radiation therapy 11/29/14-12/12/14    rt prox humerus/shoulder/scapula 25Gy/154f . Encounter for antineoplastic chemotherapy 01/31/2015  . [facility]an[facility] ([facility]  . Bone cancer ([facility]    t_spine T-5  . Prostate cancer ([facility]  . Skin cancer 1994    melanoma  right neck   . Multiple myeloma ([facility]  . Hearing loss   . [facility]    Past Surgical History  Procedure Laterality Date  . Hernia repair    . Posterior cervical fusion/foraminotomy N/A 06/17/2014    Procedure: Thoracic five laminectomy for epidural tumor resection Thoracic 4-7 posterior lateral arthrodesis, segmental pedicle screw fixation.;  Surgeon: KyAshok PallMD;  [facility];  Service: Neurosurgery;  Laterality: N/A;    There were no vitals filed for this visit.  Visit Diagnosis:  Unsteadiness  Abnormality of gait  Weakness of left leg  Weakness generalized      Subjective Assessment - 05/08/15 0850    Subjective No changes since last visit.  "I want to go  over those hip exercises to make sure that I am doing them right."    Pertinent History Basal ganglia infarct 02/10/15 with expressive aphasia; prostate cancer, skin cancer, multiple myeloma (active; chemotherapy on Wednesdays)   Limitations Walking;House hold activities   Patient Stated Goals "To be able to function without the walker (rollator); better balance."   Currently in Pain? No/denies                         [facility] - 05/08/15 0851    Transfers   Transfers Sit to Stand;Stand to Sit   Sit to Stand 6: Modified independent (Device/Increase time);5: Supervision   Stand to Sit 6: Modified independent (Device/Increase time)   Ambulation/Gait   Ambulation/Gait Yes   Ambulation/Gait Assistance 4: Min guard;5: Supervision   Ambulation/Gait Assistance Details Min cues on larger stride length, however seems much improved today vs previous session.  Cues for posture as well.     Ambulation Distance (Feet) 30 Feet  x 3 reps, 100', 45'   Assistive device Straight cane   Gait Pattern Step-through pattern;Decreased hip/knee flexion - right;Decreased dorsiflexion - right;Decreased weight shift to left;Right foot flat;Trunk flexed;Poor foot clearance - right;Shuffle;Scissoring   Ambulation Surface Level;Indoor   Ramp 5: Supervision  w/ SPC   Ramp Details (indicate cue type and reason) Min cues for stepping  sequence stepping off edge of ramp for safety.     Curb 5: Supervision  with [facility]   Curb Details (indicate cue type and reason) Cues for stepping through with bottom foot as to not be on edge of curb and risk losing balance.     High Level Balance   High Level Balance Comments Continue to focus on gait with SPC while negotiating around obstacles in figure 8 pattern.  Performed x 6 reps with cues for shorter inside step, larger outside step and allowing pelvis to turn and face direction in which he is walking.  Transitioned to stepping out and sideways  (two 90 deg turns) to focus on stepping strategy and increased step length/width to simulate turning in kitchen.  Transitioned to side stepping down counter top with light BUE support with cues for increased step length to carry over to gait and improved stepping strategy.     Self-Care   Other Self-Care Comments  Continued to educate and perform 4 way hip exercises with red band and performed all 4 directions 10 x each.  Cues for upright posture and slower reps.  Also requires cues for trying not to touch down midway between exercises.  Pt verbalized and demonsrtated understanding.    Exercises   Other Exercises  Performed 4way hip as listed above in self care.                 PT Education - 05/08/15 1327    Education provided Yes   Education Details education on HEP, using cane at all times inside home.    Person(s) Educated Patient   Methods Explanation   Comprehension Verbalized understanding          PT Short Term Goals - 04/24/15 1427    PT SHORT TERM GOAL #1   Title Pt will perform home exercises with mod I using paper handout to indicate safe HEP compliance. Target date: 03/26/15   Baseline Met 8/15.   Status Achieved   PT SHORT TERM GOAL #2   Title Perform Berg Balance Scale to assess stability with functional standing balance.  Target date: 03/26/15   Baseline 8/1: score 29/56   Status Achieved   PT SHORT TERM GOAL #3   Title Pt will decrease TUG time from 21.58 to 17.5 seconds or less to indicate improved efficiency of functional mobility.  Target date: 03/26/15   Baseline 9/1: 15.94 sec (average of 3 trials) with rollator   Status Achieved   PT SHORT TERM GOAL #4   Title Pt will negotiate standard curb step with mod I using LRAD to indicate increased safety/independence with community mobility.  Target date: 03/26/15   Baseline Met 8/15.   Status Achieved   PT SHORT TERM GOAL #5   Title Pt will ambulate at self-selected gait speed of 2.63 ft/sec to reach status of  community ambulator. Target date: 03/26/15   Baseline 8/15: 2.65 ft/sec with rollator   Status Achieved   PT SHORT TERM GOAL #6   Title Pt will perform sit > stand from standard chair without UE support to indicate increased functional LE strength.  Target date: 03/26/15   Baseline 8/23: sit > stand without UE support with supervision, cueing for full anterior weight shift   Status Partially Met           PT Long Term Goals - 04/26/15 1112    PT LONG TERM GOAL #1   Title Pt/spouse will verbalize understanding of CVA warning signs to  indicate understanding of situation in which to seek medical attention. Target date: 04/23/15.   Baseline Met 9/22.   Status Achieved   PT LONG TERM GOAL #2   Title Pt will ambulate x200' over indoor, level surfaces with mod I using SPC to indicate increased pt independence with household mobility, progress toward pt-stated goal. Modified target date: 05/24/15.   Baseline 9/22: supervision required.  Continue goal through extended POC.   Status Not Met   PT LONG TERM GOAL #3   Title Pt will perform TUG test in < 13.5 sconds to indicate decreased risk of falling. Target date: 04/23/15.   Baseline 9/22: 15.05 sec with SPC   Status Achieved   PT LONG TERM GOAL #4   Title Pt will increase Berg Balance Scale score by 6 points from baseline to indicate improvement in functional standing balance. Target date: 04/23/15.   Baseline 8/1: score 29/56 ;   9/20: score 44/56   Status Achieved   PT LONG TERM GOAL #5   Title Pt will ambulate x500' over unlevel, paved surfaces with mod I using single point cane with no overt LOB to indicate increased safety with community mobility, progress toward pt-stated goal of walking with SPC.  Modified target date: 05/24/15.   Baseline Requires supervision to min guard for outdoor gait up to 350'   Status Not Met   PT LONG TERM GOAL #6   Title Pt will negotiate 4 stairs with 1 rail with mod I using LRAD to indicate increased  independence with community access. Target date: 04/23/15.   Baseline Met 9/22.   Status Achieved   PT LONG TERM GOAL #7   Title Pt will negotiate standard ramp and curb step with mod I using SPC to indicate safety traversing community obstacles. Modified target date: 05/24/15   Baseline 9/22: requires supervision  Continue goal through extended POC.   Status Not Met   PT LONG TERM GOAL #8   Title Pt will perform floor transfer with mod I using standard chair to enble pt to safely recover from fall. Target date: 05/24/15.   Status New               Plan - 05/08/15 1328    Clinical Impression Statement Continue to address safe use of SPC in home negotiating around obstacles to better simulate home.  Note marked improvement today vs previous session, however continues to require cues for larger step lengths and cane placement.  Also went over 4 way hip exercise per pt request.  Tolerated well with good return demonstration.     Pt will benefit from skilled therapeutic intervention in order to improve on the following deficits Abnormal gait;Decreased balance;Difficulty walking;Decreased strength;Postural dysfunction;Decreased knowledge of use of DME;Decreased activity tolerance;Decreased endurance   Rehab Potential Good   Clinical Impairments Affecting Rehab Potential Currently undergoing chemotherapy 1x/week   PT Frequency 2x / week   PT Duration 4 weeks   PT Treatment/Interventions ADLs/Self Care Home Management;Gait training;Therapeutic exercise;Patient/family education;Stair training;Balance training;Functional mobility training;Neuromuscular re-education;Manual techniques;Therapeutic activities;DME Instruction;Vestibular;Orthotic Fit/Training   PT Next Visit Plan GCODE! floor transfer, outside with cane, stepping strategy   Consulted and Agree with Plan of Care Patient        Problem List Patient Active Problem List   Diagnosis Date Noted  . Diarrhea 04/25/2015  . Cancer  associated pain 04/25/2015  . Long term current use of anticoagulant therapy 04/25/2015  . Basal ganglia infarction ([place]) 02/12/2015  . Dizziness   . Expressive  aphasia   . Past pointing   . Right leg weakness   . Acute ischemic stroke ([place]) 02/11/2015  . CVA (cerebral infarction) 02/11/2015  . Aspiration pneumonia ([place]) 02/11/2015  . Aphasia   . Difficulty speaking   . Speech difficult to understand   . Stroke ([place])   . Encounter for antineoplastic chemotherapy 01/31/2015  . Constipation 12/20/2014  . Rash 12/20/2014  . Cellulitis 12/06/2014  . Peripheral edema 12/06/2014  . Gait disorder 09/05/2014  . Multiple myeloma ([place])   . Paraplegia ([employer]) 06/21/2014  . Postoperative anemia due to acute blood loss 06/21/2014  . 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 Spine metastasis ([place]) 06/17/2014  . Thoracic spine tumor 06/17/2014  . Metastatic bone cancer ([place]) 06/15/2014    Cameron Sprang, PT, MPT [facility] [address] [place], [place], [address] Phone: [phone]   Fax:  [phone] 05/08/2015, 1:35 PM

## 2015-05-08 NOTE — Telephone Encounter (Signed)
s.w. pt wife on 22.3.16 and confirmed appts. she was ok and aware of all d.t.

## 2015-05-08 NOTE — Therapy (Signed)
[facility] [address] [place], [place], [address] Phone: [phone]   Fax:  [phone]  Speech Language Pathology Treatment  Patient Details  Name: Jeremiah Owens MRN: [id number] Date of Birth: 1930/05/02 Referring Provider:  Wenda Low, MD  Encounter Date: 05/08/2015      End of Session - 05/08/15 1029    Visit Number 18   Number of Visits 24   Date for SLP Re-Evaluation 06/01/15   SLP Start Time 0803   SLP Stop Time  0846   SLP Time Calculation (min) 43 min   Activity Tolerance Patient tolerated treatment well      Past Medical History  Diagnosis Date  . Compression fracture   . Allergy   . Anxiety   . Thyroid disease   . S/P radiation therapy 08/21/14-09/04/14    T4-6 25Gy/72f  . S/P radiation therapy 11/29/14-12/12/14    rt prox humerus/shoulder/scapula 25Gy/159f . Encounter for antineoplastic chemotherapy 01/31/2015  . Melanoma ([facility]  . Bone cancer ([facility]    t_spine T-5  . [facility]state cancer ([facility]  . Skin cancer 1994    melanoma  right neck   . Multiple myeloma (HCA[facility]  . Hearing loss   . [facility]    Past Surgical History  Procedure Laterality Date  . Hernia repair    . Posterior cervical fusion/foraminotomy N/A 06/17/2014    Procedure: Thoracic five laminectomy for epidural tumor resection Thoracic 4-7 posterior lateral arthrodesis, segmental pedicle screw fixation.;  Surgeon: KyAshok PallMD;  [facility];  Service: Neurosurgery;  Laterality: N/A;    There were no vitals filed for this visit.  Visit Diagnosis: Expressive aphasia      Subjective Assessment - 05/08/15 0839    Subjective "I needed to have slides to make it easier for me to talk" (pt gave slideshow and found it was in fact easier for him to generate sentences with picture support)               ADULT SLP TREATMENT - 05/08/15 1025    General Information   Behavior/Cognition  Alert;Cooperative;Pleasant mood   Treatment Provided   Treatment provided Cognitive-Linquistic   Pain Assessment   Pain Assessment No/denies pain   Cognitive-Linquistic Treatment   Treatment focused on Aphasia   Skilled Treatment Worked through pt's homework with him - he req'd mod-max A consistently with these responses - approx 5% of all responses. Pt benefitted from picture cues instead of semantic or phonemic cues. Pt was ecnouraged that he was able to explain slides with picture support.   Assessment / Recommendations / Plan   Plan Continue with current plan of care   Progression Toward Goals   Progression toward goals Progressing toward goals            SLP Short Term Goals - 04/26/15 1055    SLP SHORT TERM GOAL #1   Title pt will produce sentence responses in mod complex tasks 80% and rare min A   Status Not Met   SLP SHORT TERM GOAL #2   Title pt will demo HEP for swallowing with rare min A   Status Achieved   SLP SHORT TERM GOAL #3   Title pt will demo compensatory strategies for anomia with occasasional min A   Status Not Met   SLP SHORT TERM GOAL #4   Title Pt's signature/ basic demographic info will not show any signs of aphasia   Status  Partially Met          SLP Long Term Goals - 05/08/15 1031    SLP LONG TERM GOAL #1   Title pt will demo 10 minutes mod complex conversation fucntionally with occasional verbal cues   Baseline Continue goal upon renewal 04/30/15  to 06/03/15   Time 2   Period Weeks   Status Revised   SLP LONG TERM GOAL #2   Title pt will demo HEP for swallowing with modified independence   Baseline Continue goal upon renewal 04/30/15 to 06/03/15   Time 2   Period Weeks   Status On-going   SLP LONG TERM GOAL #3   Title pt will write checks adequately with rare min cues   Baseline Continue goal upon renewal 04/30/15 to 06/03/15   Time 2   Period Weeks   Status On-going          Plan - 05/08/15 1030    Clinical Impression Statement  Pt requires continued skilled ST to maximize verbal expression and written expression as aphasia persists requiring min to mod A to participate in conversation due to aphasia . Recommend ST continue for the near future.   Speech Therapy Frequency 2x / week   Duration 2 weeks   Treatment/Interventions Internal/external aids;Compensatory strategies;SLP instruction and feedback;Patient/family education;Functional tasks;Cueing hierarchy;Pharyngeal strengthening exercises;Oral motor exercises;Compensatory techniques;Diet toleration management by SLP   Potential to Achieve Goals Fair   Potential Considerations Severity of impairments        Problem List Patient Active Problem List   Diagnosis Date Noted  . Diarrhea 04/25/2015  . Cancer associated pain 04/25/2015  . Long term current use of anticoagulant therapy 04/25/2015  . Basal ganglia infarction ([place]) 02/12/2015  . Dizziness   . Expressive aphasia   . Past pointing   . Right leg weakness   . Acute ischemic stroke ([place]) 02/11/2015  . CVA (cerebral infarction) 02/11/2015  . Aspiration pneumonia ([place]) 02/11/2015  . Aphasia   . Difficulty speaking   . Speech difficult to understand   . Stroke ([place])   . Encounter for antineoplastic chemotherapy 01/31/2015  . Constipation 12/20/2014  . Rash 12/20/2014  . Cellulitis 12/06/2014  . Peripheral edema 12/06/2014  . Gait disorder 09/05/2014  . Multiple myeloma ([place])   . Paraplegia ([place]) 06/21/2014  . Postoperative anemia due to acute blood loss 06/21/2014  . 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 Spine metastasis ([place]) 06/17/2014  . Thoracic spine tumor 06/17/2014  . Metastatic bone cancer ([place]) 06/15/2014    Britteney Ayotte , [place], [place]  05/08/2015, 10:35 AM  [facility] [address] [place], [place], [address] Phone: [phone]   Fax:  [phone]

## 2015-05-09 ENCOUNTER — Ambulatory Visit (HOSPITAL_BASED_OUTPATIENT_CLINIC_OR_DEPARTMENT_OTHER): Payer: Medicare Other

## 2015-05-09 ENCOUNTER — Other Ambulatory Visit: Payer: Medicare Other

## 2015-05-09 ENCOUNTER — Other Ambulatory Visit: Payer: Self-pay | Admitting: Medical Oncology

## 2015-05-09 ENCOUNTER — Other Ambulatory Visit (HOSPITAL_BASED_OUTPATIENT_CLINIC_OR_DEPARTMENT_OTHER): Payer: Medicare Other

## 2015-05-09 VITALS — BP 123/60 | HR 77 | Temp 97.4°F | Resp 18

## 2015-05-09 DIAGNOSIS — C9000 Multiple myeloma not having achieved remission: Secondary | ICD-10-CM

## 2015-05-09 DIAGNOSIS — Z23 Encounter for immunization: Secondary | ICD-10-CM

## 2015-05-09 DIAGNOSIS — Z5112 Encounter for antineoplastic immunotherapy: Secondary | ICD-10-CM | POA: Diagnosis present

## 2015-05-09 DIAGNOSIS — C419 Malignant neoplasm of bone and articular cartilage, unspecified: Secondary | ICD-10-CM

## 2015-05-09 LAB — CBC WITH DIFFERENTIAL/PLATELET
BASO%: 0.3 % (ref 0.0–2.0)
Basophils Absolute: 0 10*3/uL (ref 0.0–0.1)
EOS ABS: 0 10*3/uL (ref 0.0–0.5)
EOS%: 0.6 % (ref 0.0–7.0)
HCT: 36.2 % — ABNORMAL LOW (ref 38.4–49.9)
HGB: 12.4 g/dL — ABNORMAL LOW (ref 13.0–17.1)
LYMPH%: 10.2 % — AB (ref 14.0–49.0)
MCH: 33.2 pg (ref 27.2–33.4)
MCHC: 34.3 g/dL (ref 32.0–36.0)
MCV: 96.8 fL (ref 79.3–98.0)
MONO#: 0 10*3/uL — AB (ref 0.1–0.9)
MONO%: 1.2 % (ref 0.0–14.0)
NEUT%: 87.7 % — ABNORMAL HIGH (ref 39.0–75.0)
NEUTROS ABS: 3 10*3/uL (ref 1.5–6.5)
NRBC: 0 % (ref 0–0)
PLATELETS: 70 10*3/uL — AB (ref 140–400)
RBC: 3.74 10*6/uL — AB (ref 4.20–5.82)
RDW: 15.7 % — AB (ref 11.0–14.6)
WBC: 3.4 10*3/uL — AB (ref 4.0–10.3)
lymph#: 0.4 10*3/uL — ABNORMAL LOW (ref 0.9–3.3)

## 2015-05-09 LAB — BASIC METABOLIC PANEL (CC13)
Anion Gap: 7 mEq/L (ref 3–11)
BUN: 15.1 mg/dL (ref 7.0–26.0)
CALCIUM: 8.7 mg/dL (ref 8.4–10.4)
CO2: 28 meq/L (ref 22–29)
CREATININE: 0.9 mg/dL (ref 0.7–1.3)
Chloride: 103 mEq/L (ref 98–109)
EGFR: 76 mL/min/{1.73_m2} — ABNORMAL LOW (ref >90)
Glucose: 143 mg/dl — ABNORMAL HIGH (ref 70–140)
Potassium: 4.1 mEq/L (ref 3.5–5.1)
SODIUM: 139 meq/L (ref 136–145)

## 2015-05-09 MED ORDER — ONDANSETRON 8 MG PO TBDP
8.0000 mg | ORAL_TABLET | Freq: Once | ORAL | Status: AC
  Administered 2015-05-09: 8 mg via ORAL
  Filled 2015-05-09: qty 1

## 2015-05-09 MED ORDER — BORTEZOMIB CHEMO SQ INJECTION 3.5 MG (2.5MG/ML)
1.3000 mg/m2 | Freq: Once | INTRAMUSCULAR | Status: AC
  Administered 2015-05-09: 2.5 mg via SUBCUTANEOUS
  Filled 2015-05-09: qty 2.5

## 2015-05-09 MED ORDER — INFLUENZA VAC SPLIT QUAD 0.5 ML IM SUSY
0.5000 mL | PREFILLED_SYRINGE | INTRAMUSCULAR | Status: AC
  Administered 2015-05-09: 0.5 mL via INTRAMUSCULAR
  Filled 2015-05-09: qty 0.5

## 2015-05-09 NOTE — Progress Notes (Signed)
Pt platelets 75. Ok to treat with Velcade per Dr. Julien Nordmann. Pt requesting for flu shot today. Dr. Julien Nordmann ordered for flu vaccine to be given today.

## 2015-05-09 NOTE — Patient Instructions (Signed)
[facility] Discharge Instructions for Patients Receiving Chemotherapy  Today you received the following chemotherapy agents VELCADE  To help prevent nausea and vomiting after your treatment, we encourage you to take your nausea medication   If you develop nausea and vomiting that is not controlled by your nausea medication, call the clinic.   BELOW ARE SYMPTOMS THAT SHOULD BE REPORTED IMMEDIATELY:  *FEVER GREATER THAN 100.5 F  *CHILLS WITH OR WITHOUT FEVER  NAUSEA AND VOMITING THAT IS NOT CONTROLLED WITH YOUR NAUSEA MEDICATION  *UNUSUAL SHORTNESS OF BREATH  *UNUSUAL BRUISING OR BLEEDING  TENDERNESS IN MOUTH AND THROAT WITH OR WITHOUT PRESENCE OF ULCERS  *URINARY PROBLEMS  *BOWEL PROBLEMS   UNUSUAL RASH Items with * indicate a potential emergency and should be followed up as soon as possible.  Feel free to call the clinic should you have any questions or concerns. The clinic phone number is [phone].  Please show the [place] at check-in to the Emergency Department and triage nurse.     Thrombocytopenia Thrombocytopenia means there are not enough platelets in your blood. Platelets are tiny cells in your blood. When you start bleeding, platelets clump together around the cut or injury to stop the bleeding. This process is called blood clotting. Not having enough platelets can cause bleeding problems. HOME CARE  Check your skin and inside your mouth for bruises or blood as told by your doctor.  Check your spit (sputum), pee (urine), and poop (stool) for blood as told by your doctor.  Do not do activities that can cause bumps or bruises until your doctor says it is okay.  Be careful not to cut yourself when you shave or use scissors, needles, knives, or other tools.  Be careful not to burn yourself when you iron or cook.  Ask your doctor if you can drink alcohol.  Only take medicines as told by your doctor.  Tell all your doctors and  your dentist that you have this bleeding problem. GET HELP RIGHT AWAY IF:  You are bleeding anywhere on your body.  You are bleeding or have bruises without knowing why.  You have blood in your spit, pee, or poop. MAKE SURE YOU:  Understand these instructions.  Will watch your condition.  Will get help right away if you are not doing well or get worse. Document Released: 07/10/2011 Document Revised: 10/13/2011 Document Reviewed: 07/10/2011 [facility] Patient Information 2015 [place], [place]. This information is not intended to replace advice given to you by your health care provider. Make sure you discuss any questions you have with your health care provider.

## 2015-05-10 ENCOUNTER — Encounter: Payer: Self-pay | Admitting: Rehabilitation

## 2015-05-10 ENCOUNTER — Ambulatory Visit: Payer: Medicare Other | Admitting: Rehabilitation

## 2015-05-10 ENCOUNTER — Other Ambulatory Visit: Payer: Self-pay | Admitting: Medical Oncology

## 2015-05-10 ENCOUNTER — Ambulatory Visit: Payer: Medicare Other

## 2015-05-10 DIAGNOSIS — R2681 Unsteadiness on feet: Secondary | ICD-10-CM

## 2015-05-10 DIAGNOSIS — C9000 Multiple myeloma not having achieved remission: Secondary | ICD-10-CM | POA: Diagnosis not present

## 2015-05-10 DIAGNOSIS — R4701 Aphasia: Secondary | ICD-10-CM | POA: Diagnosis not present

## 2015-05-10 DIAGNOSIS — M6281 Muscle weakness (generalized): Secondary | ICD-10-CM | POA: Diagnosis not present

## 2015-05-10 DIAGNOSIS — R269 Unspecified abnormalities of gait and mobility: Secondary | ICD-10-CM

## 2015-05-10 DIAGNOSIS — R1313 Dysphagia, pharyngeal phase: Secondary | ICD-10-CM | POA: Diagnosis not present

## 2015-05-10 DIAGNOSIS — R29898 Other symptoms and signs involving the musculoskeletal system: Secondary | ICD-10-CM

## 2015-05-10 DIAGNOSIS — R2991 Unspecified symptoms and signs involving the musculoskeletal system: Secondary | ICD-10-CM | POA: Diagnosis not present

## 2015-05-10 DIAGNOSIS — R531 Weakness: Secondary | ICD-10-CM

## 2015-05-10 MED ORDER — LENALIDOMIDE 25 MG PO CAPS: 25.0000 mg | ORAL_CAPSULE | Freq: Every day | ORAL | Status: DC

## 2015-05-10 NOTE — Patient Instructions (Signed)
  Please complete the assigned speech therapy homework and return it to your next session.  

## 2015-05-10 NOTE — Therapy (Signed)
[facility] [address] [place], [place], [address] Phone: [phone]   Fax:  [phone]  Speech Language Pathology Treatment  Patient Details  Name: Jeremiah Owens MRN: [id number] Date of Birth: [birth year] Referring Provider:  Wenda Low, MD  Encounter Date: 05/10/2015      End of Session - 05/10/15 1314    Visit Number 19   Number of Visits 24   Date for SLP Re-Evaluation 06/01/15   SLP Start Time 0847   SLP Stop Time  0930   SLP Time Calculation (min) 43 min   Activity Tolerance Patient tolerated treatment well      Past Medical History  Diagnosis Date  . Compression fracture   . Allergy   . Anxiety   . Thyroid disease   . S/P radiation therapy 08/21/14-09/04/14    T4-6 25Gy/29f  . S/P radiation therapy 11/29/14-12/12/14    rt prox humerus/shoulder/scapula 25Gy/112f . Encounter for antineoplastic chemotherapy 01/31/2015  . Melanoma ([facility]  . Bone cancer ([facility]    t_spine T-5  . Prostate cancer ([facility]  . Skin cancer 1994    melanoma  right neck   . Multiple myeloma ([facility]  . Hearing loss   . [facility] ([facility]    Past Surgical History  Procedure Laterality Date  . Hernia repair    . Posterior cervical fusion/foraminotomy N/A 06/17/2014    Procedure: Thoracic five laminectomy for epidural tumor resection Thoracic 4-7 posterior lateral arthrodesis, segmental pedicle screw fixation.;  Surgeon: KyAshok PallMD;  [facility];  Service: Neurosurgery;  Laterality: N/A;    There were no vitals filed for this visit.  Visit Diagnosis: Expressive aphasia  Pharyngeal dysphagia      Subjective Assessment - 05/10/15 0915    Subjective Pt brought all blank homework pages to therapy and would like to review them for therapy visit today.   Currently in Pain? No/denies               ADULT SLP TREATMENT - 05/10/15 0918    General Information   Behavior/Cognition  Alert;Cooperative;Pleasant mood   Treatment Provided   Treatment provided Cognitive-Linquistic   Cognitive-Linquistic Treatment   Treatment focused on Aphasia   Skilled Treatment Pt's linguistic connections between words was evident in both simple and abstract connections (divergent and convergent naming tasks). Pt req'd min A occasionally. In fill in tasks (medial) pt req'd cues for strategies to find letters that fit into word puzzles. Pt encouraged to complete crossword puzzles. Phonemic cues were helpful. Pt admitted his spelling was not excellent premorbidly, which would make fill in task difficult. Pt reports his swallowing cont as WFL/WNL, no notable ocughing episodes. No overt s/s aspiration PNA today.    Assessment / Recommendations / Plan   Plan Continue with current plan of care   Progression Toward Goals   Progression toward goals Progressing toward goals            SLP Short Term Goals - 04/26/15 1055    SLP SHORT TERM GOAL #1   Title pt will produce sentence responses in mod complex tasks 80% and rare min A   Status Not Met   SLP SHORT TERM GOAL #2   Title pt will demo HEP for swallowing with rare min A   Status Achieved   SLP SHORT TERM GOAL #3   Title pt will demo compensatory strategies for anomia with occasasional min A   Status Not Met  SLP SHORT TERM GOAL #4   Title Pt's signature/ basic demographic info will not show any signs of aphasia   Status Partially Met          SLP Long Term Goals - 05/08/15 1031    SLP LONG TERM GOAL #1   Title pt will demo 10 minutes mod complex conversation fucntionally with occasional verbal cues   Baseline Continue goal upon renewal 04/30/15  to 06/03/15   Time 2   Period Weeks   Status Revised   SLP LONG TERM GOAL #2   Title pt will demo HEP for swallowing with modified independence   Baseline Continue goal upon renewal 04/30/15 to 06/03/15   Time 2   Period Weeks   Status On-going   SLP LONG TERM GOAL #3   Title pt  will write checks adequately with rare min cues   Baseline Continue goal upon renewal 04/30/15 to 06/03/15   Time 2   Period Weeks   Status On-going          Plan - 05/10/15 1314    Clinical Impression Statement Pt requires continued skilled ST to maximize verbal expression and written expression as aphasia persists requiring min to mod A to participate in conversation due to aphasia . Recommend ST continue for the near future.   Speech Therapy Frequency 2x / week   Duration 2 weeks   Treatment/Interventions Internal/external aids;Compensatory strategies;SLP instruction and feedback;Patient/family education;Functional tasks;Cueing hierarchy;Pharyngeal strengthening exercises;Oral motor exercises;Compensatory techniques;Diet toleration management by SLP   Potential to Achieve Goals Fair   Potential Considerations Severity of impairments        Problem List Patient Active Problem List   Diagnosis Date Noted  . Diarrhea 04/25/2015  . Cancer associated pain 04/25/2015  . Long term current use of anticoagulant therapy 04/25/2015  . Basal ganglia infarction ([place] [place]) 02/12/2015  . Dizziness   . Expressive aphasia   . Past pointing   . Right leg weakness   . Acute ischemic stroke ([place]) 02/11/2015  . CVA (cerebral infarction) 02/11/2015  . Aspiration pneumonia ([place]) 02/11/2015  . Aphasia   . Difficulty speaking   . Speech difficult to understand   . Stroke ([place])   . Encounter for antineoplastic chemotherapy 01/31/2015  . Constipation 12/20/2014  . Rash 12/20/2014  . Cellulitis 12/06/2014  . Peripheral edema 12/06/2014  . Gait disorder 09/05/2014  . Multiple myeloma ([place])   . Paraplegia ([place]) 06/21/2014  . Postoperative anemia due to acute blood loss 06/21/2014  . 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 Spine metastasis ([place]) 06/17/2014  . Thoracic spine tumor 06/17/2014  . Metastatic bone cancer ([place]) 06/15/2014    SCHINKE,CARL , [place], [place]  05/10/2015, 1:15 PM  [facility] [address] [place], [place], [address] Phone: [phone]   Fax:  [phone]

## 2015-05-10 NOTE — Therapy (Addendum)
[facility] [address] [place], [place], [address] Phone: [phone]   Fax:  [phone]  Physical Therapy Treatment  Patient Details  Name: Jeremiah Owens MRN: [id number] Date of Birth: 1929-10-21 Referring Provider:  Wenda Low, MD  Encounter Date: 05/10/2015      PT End of Session - 05/10/15 0857    Visit Number 20   Number of Visits 24  2x/wk for 4 additional weeks   Date for PT Re-Evaluation 05/26/15   Authorization Type KX;  Medicare [employer] - G codes required every 10 visits   PT Start Time 0802   PT Stop Time 0845   PT Time Calculation (min) 43 min   Equipment Utilized During Treatment Gait belt   Activity Tolerance Patient tolerated treatment well   Behavior During Therapy WFL for tasks assessed/performed      Past Medical History  Diagnosis Date  . Compression fracture   . Allergy   . Anxiety   . Thyroid disease   . S/P radiation therapy 08/21/14-09/04/14    T4-6 25Gy/34f  . S/P radiation therapy 11/29/14-12/12/14    rt prox humerus/shoulder/scapula 25Gy/161f . Encounter for antineoplastic chemotherapy 01/31/2015  . Melanoma ([facility]  . Bone cancer ([facility]    t_spine T-5  . Prostate cancer ([facility]2002  . Skin cancer 1994    melanoma  right neck   . Multiple myeloma ([facility]  . Hearing loss   . [facility]    Past Surgical History  Procedure Laterality Date  . Hernia repair    . Posterior cervical fusion/foraminotomy N/A 06/17/2014    Procedure: Thoracic five laminectomy for epidural tumor resection Thoracic 4-7 posterior lateral arthrodesis, segmental pedicle screw fixation.;  Surgeon: KyAshok PallMD;  [facility];  Service: Neurosurgery;  Laterality: N/A;    There were no vitals filed for this visit.  Visit Diagnosis:  Abnormality of gait  Weakness of left leg  Unsteadiness  Weakness generalized      Subjective Assessment - 05/10/15 0809    Subjective "I  didn't sleep well last night."  "I didn't like having my chemo treatment so late, and then I was at church until 9."   Pertinent History Basal ganglia infarct 02/10/15 with expressive aphasia; prostate cancer, skin cancer, multiple myeloma (active; chemotherapy on Wednesdays)   Limitations Walking;House hold activities   Patient Stated Goals "To be able to function without the walker (rollator); better balance."   Currently in Pain? No/denies                         [facility] - 05/10/15 0814    Transfers   Transfers Sit to Stand;Stand to Sit   Sit to Stand 6: Modified independent (Device/Increase time);5: Supervision   Stand to Sit 6: Modified independent (Device/Increase time)   Ambulation/Gait   Ambulation/Gait Yes   Ambulation/Gait Assistance 4: Min guard;5: Supervision   Ambulation/Gait Assistance Details Addressed gait outdoors with use of SPC to work towards LTG.  Pt S overall however min/guard when stepping over obstacles and requires min/guard when ambulating and then stopping.  Cues for increased heel to toe contact bilaterally as well as increased hip/knee flex and B DF for improved clearance.     Ambulation Distance (Feet) 500 Feet  25' x 4 during obstacles, 100' indoors   Assistive device Straight cane   Gait Pattern Step-through pattern;Decreased hip/knee flexion - right;Decreased dorsiflexion -  right;Decreased weight shift to left;Right foot flat;Trunk flexed;Poor foot clearance - right;Shuffle;Scissoring   Ambulation Surface Level;Unlevel;Indoor;Outdoor;Paved   Ramp --   Curb --   Timed Up and Go Test   TUG Normal TUG   Normal TUG (seconds) 15.03  w/ SPC   High Level Balance   High Level Balance Comments Continue to focus on gait while negotiating around obstacles and over obstacles to better simulate home, and increase larger step lengths bilaterally.  Performed obstacle course x 25' x 4 reps total with SPC at S to min/guard level.   Pt with once instance of stepping over obstacle and LOB posteriorly, therefore requires min/guard and cues for increased forward weight shift.  Ended with stepping task in "4 square" pattern to encourage improved stepping strategy and increased foot clerance to carry over to gait.  Pt requires min/guard throughout and min to mod A for stepping backwards due to increased posterior weight shift when stepping and inadequate ability to weight shift during posterior stepping.     Therapeutic Activites    Therapeutic Activities Other Therapeutic Activities   Other Therapeutic Activities Performed floor recovery in case of fall at home.  Pt able to perform at overall S to mod I level with min cues for when to attempt transfer vs when to call 911.  States that he has been working on his toilet from the floor yesterday.  Discussed options to ensure safety by coming up to sitting on toilet and ensuring that wife present in case of need of assist.                  PT Education - 05/10/15 0857    Education provided Yes   Education Details fall recovery at home   Person(s) Educated Patient   Methods Explanation;Demonstration   Comprehension Verbalized understanding;Returned demonstration          PT Short Term Goals - 04/24/15 1427    PT SHORT TERM GOAL #1   Title Pt will perform home exercises with mod I using paper handout to indicate safe HEP compliance. Target date: 03/26/15   Baseline Met 8/15.   Status Achieved   PT SHORT TERM GOAL #2   Title Perform Berg Balance Scale to assess stability with functional standing balance.  Target date: 03/26/15   Baseline 8/1: score 29/56   Status Achieved   PT SHORT TERM GOAL #3   Title Pt will decrease TUG time from 21.58 to 17.5 seconds or less to indicate improved efficiency of functional mobility.  Target date: 03/26/15   Baseline 9/1: 15.94 sec (average of 3 trials) with rollator   Status Achieved   PT SHORT TERM GOAL #4   Title Pt will negotiate  standard curb step with mod I using LRAD to indicate increased safety/independence with community mobility.  Target date: 03/26/15   Baseline Met 8/15.   Status Achieved   PT SHORT TERM GOAL #5   Title Pt will ambulate at self-selected gait speed of 2.63 ft/sec to reach status of community ambulator. Target date: 03/26/15   Baseline 8/15: 2.65 ft/sec with rollator   Status Achieved   PT SHORT TERM GOAL #6   Title Pt will perform sit > stand from standard chair without UE support to indicate increased functional LE strength.  Target date: 03/26/15   Baseline 8/23: sit > stand without UE support with supervision, cueing for full anterior weight shift   Status Partially Met  PT Long Term Goals - 05/10/15 1150    PT LONG TERM GOAL #1   Title Pt/spouse will verbalize understanding of CVA warning signs to indicate understanding of situation in which to seek medical attention. Target date: 04/23/15.   Baseline Met 9/22.   Status Achieved   PT LONG TERM GOAL #2   Title Pt will ambulate x200' over indoor, level surfaces with S using SPC to indicate increased pt independence with household mobility, progress toward pt-stated goal. Modified target date: 05/24/15.   Baseline 05/10/15 revised goal to S level due to needing intermittent min/guard and inconsisten progress.    Status Revised   PT LONG TERM GOAL #3   Title Pt will perform TUG test in < 13.5 sconds to indicate decreased risk of falling. Target date: 04/23/15.   Baseline 05/10/15 not met, defer goal as this is no longer goal of therapy sessions.    Status Deferred   PT LONG TERM GOAL #4   Title Pt will increase Berg Balance Scale score by 6 points from baseline to indicate improvement in functional standing balance. Target date: 04/23/15.   Baseline 8/1: score 29/56 ;   9/20: score 44/56   Status Achieved   PT LONG TERM GOAL #5   Title Pt will ambulate x500' over unlevel, paved surfaces with mod I using single point cane with no  overt LOB to indicate increased safety with community mobility, progress toward pt-stated goal of walking with SPC.  Modified target date: 05/24/15.   Baseline Pt continues to require min/guard with SPC, therefore feel that this goal no longer safe at this time and will defer goal.    Status Deferred   PT LONG TERM GOAL #6   Title Pt will negotiate 4 stairs with 1 rail with mod I using LRAD to indicate increased independence with community access. Target date: 04/23/15.   Baseline Met 9/22.   Status Achieved   PT LONG TERM GOAL #7   Title Pt will negotiate standard ramp and curb step with mod I using SPC to indicate safety traversing community obstacles. Modified target date: 05/24/15   Baseline Requires S and due to deferred community ambulation with SPC goal, will defer this goal.  05/10/15   Status Deferred   PT LONG TERM GOAL #8   Title Pt will perform floor transfer with mod I using standard chair to enble pt to safely recover from fall. Target date: 05/24/15.   Status New               Plan - 05/10/15 0859    Clinical Impression Statement Pt progressing with indoor gait with use of SPC, however still recommend S at this time due to safety concerns when turning.  Continue to address safety with negotiating obstacles, gait outdoors on paved surfaces with SPC at S to min/guard level.  Continue to recommend use of rollator outdoors at this time.  Also continue to address stepping strategy for improved balance and to carry over to gait.     Pt will benefit from skilled therapeutic intervention in order to improve on the following deficits Abnormal gait;Decreased balance;Difficulty walking;Decreased strength;Postural dysfunction;Decreased knowledge of use of DME;Decreased activity tolerance;Decreased endurance   Rehab Potential Good   Clinical Impairments Affecting Rehab Potential Currently undergoing chemotherapy 1x/week   PT Frequency 2x / week   PT Duration 4 weeks   PT  Treatment/Interventions ADLs/Self Care Home Management;Gait training;Therapeutic exercise;Patient/family education;Stair training;Balance training;Functional mobility training;Neuromuscular re-education;Manual techniques;Therapeutic activities;DME Instruction;Vestibular;Orthotic Fit/Training  PT Next Visit Plan Initiate conversation about D/C, have wife be there on last visit (should D/C on visit after next) stepping strategy, backwards gait, negotiation of obstacles with SPC, outdoor paved surfaces with cane.    Consulted and Agree with Plan of Care Patient          G-Codes - 06/03/2015 0902    Functional Assessment Tool Used TUG: 15.03 sec w/ SPC   Functional Limitation Mobility: Walking and moving around   Mobility: Walking and Moving Around Current Status [phone]) At least 20 percent but less than 40 percent impaired, limited or restricted   Mobility: Walking and Moving Around Goal Status [phone]) At least 1 percent but less than 20 percent impaired, limited or restricted      Physical Therapy Progress Note  Dates of Reporting Period: 04/05/15 to 06/03/15  Objective Reports of Subjective Statement: See subjective above.    Objective Measurements: 15.03 ft/ sec TUG w/ SPC.   Goal Update: Deferred ambulation goals outdoors with SPC.   Plan: Plan to see next visit and discuss D/C on the following visit.  Hope to have wife present during D/C session for safety education.   Reason Skilled Services are Required: Continue to work on balance and stepping strategy.      Problem List Patient Active Problem List   Diagnosis Date Noted  . Diarrhea 04/25/2015  . Cancer associated pain 04/25/2015  . Long term current use of anticoagulant therapy 04/25/2015  . Basal ganglia infarction ([place]) 02/12/2015  . Dizziness   . Expressive aphasia   . Past pointing   . Right leg weakness   . Acute ischemic stroke ([place]) 02/11/2015  . CVA (cerebral infarction) 02/11/2015  . Aspiration pneumonia ([place])  02/11/2015  . Aphasia   . Difficulty speaking   . Speech difficult to understand   . Stroke ([place])   . Encounter for antineoplastic chemotherapy 01/31/2015  . Constipation 12/20/2014  . Rash 12/20/2014  . Cellulitis 12/06/2014  . Peripheral edema 12/06/2014  . Gait disorder 09/05/2014  . Multiple myeloma ([place])   . Paraplegia ([place]) 06/21/2014  . Postoperative anemia due to acute blood loss 06/21/2014  . 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 Spine metastasis ([place]) 06/17/2014  . Thoracic spine tumor 06/17/2014  . Metastatic bone cancer ([facility]) 06/15/2014   Cameron Sprang, PT, MPT [facility] [address] [place], [place], [address] Phone: [phone]   Fax:  [phone] Jun 03, 2015, 12:04 PM

## 2015-05-15 ENCOUNTER — Ambulatory Visit: Payer: Medicare Other | Admitting: Physical Therapy

## 2015-05-15 ENCOUNTER — Ambulatory Visit: Payer: Medicare Other | Admitting: Physical Medicine & Rehabilitation

## 2015-05-15 ENCOUNTER — Telehealth: Payer: Self-pay | Admitting: Physical Medicine & Rehabilitation

## 2015-05-15 ENCOUNTER — Ambulatory Visit: Payer: Medicare Other

## 2015-05-15 DIAGNOSIS — R4701 Aphasia: Secondary | ICD-10-CM

## 2015-05-15 DIAGNOSIS — R2991 Unspecified symptoms and signs involving the musculoskeletal system: Secondary | ICD-10-CM

## 2015-05-15 DIAGNOSIS — M6281 Muscle weakness (generalized): Secondary | ICD-10-CM | POA: Diagnosis not present

## 2015-05-15 DIAGNOSIS — R2681 Unsteadiness on feet: Secondary | ICD-10-CM

## 2015-05-15 DIAGNOSIS — R29818 Other symptoms and signs involving the nervous system: Secondary | ICD-10-CM

## 2015-05-15 DIAGNOSIS — I6381 Other cerebral infarction due to occlusion or stenosis of small artery: Secondary | ICD-10-CM

## 2015-05-15 DIAGNOSIS — R1310 Dysphagia, unspecified: Secondary | ICD-10-CM

## 2015-05-15 DIAGNOSIS — R1313 Dysphagia, pharyngeal phase: Secondary | ICD-10-CM

## 2015-05-15 DIAGNOSIS — I639 Cerebral infarction, unspecified: Secondary | ICD-10-CM

## 2015-05-15 DIAGNOSIS — R269 Unspecified abnormalities of gait and mobility: Secondary | ICD-10-CM

## 2015-05-15 NOTE — Patient Instructions (Signed)
Work on the double meaning terms you've already done, in help to ingrain the ideas you wrote down last week.

## 2015-05-15 NOTE — Patient Instructions (Signed)
AMBULATION: Walk Backward    Stand next to your countertop with one hand on the counter. Walk backward. Take large steps, do not drag feet. Walk backwards for the length of your corner countertop 4 times consecutively.  Do this 2-3 times per day.  Progress by changing from entire hand on countertop to 3-finger support as your balance improves.

## 2015-05-15 NOTE — Therapy (Signed)
[facility] [address] [place], [place], [address] Phone: [phone]   Fax:  [phone]  Physical Therapy Treatment  Patient Details  Name: Jeremiah Owens MRN: [id number] Date of Birth: 1929-12-11 Referring Provider:  Wenda Low, MD  Encounter Date: 05/15/2015      PT End of Session - 05/15/15 1958    Visit Number 21   Number of Visits 24   Date for PT Re-Evaluation 05/26/15   Authorization Type KX;  Medicare Trad Primary - G codes required every 10 visits   PT Start Time [phone]   PT Stop Time 0935   PT Time Calculation (min) 45 min   Activity Tolerance Patient tolerated treatment well   Behavior During Therapy [facility] for tasks assessed/performed      Past Medical History  Diagnosis Date  . Compression fracture   . Allergy   . Anxiety   . Thyroid disease   . S/P radiation therapy 08/21/14-09/04/14    T4-6 25Gy/74f  . S/P radiation therapy 11/29/14-12/12/14    rt prox humerus/shoulder/scapula 25Gy/176f . Encounter for antineoplastic chemotherapy 01/31/2015  . Melanoma ([facility]  . Bone cancer ([facility]    t_spine T-5  . Prostate cancer ([facility]  . Skin cancer 1994    melanoma  right neck   . Multiple myeloma ([facility]  . Hearing loss   . [facility]    Past Surgical History  Procedure Laterality Date  . Hernia repair    . Posterior cervical fusion/foraminotomy N/A 06/17/2014    Procedure: Thoracic five laminectomy for epidural tumor resection Thoracic 4-7 posterior lateral arthrodesis, segmental pedicle screw fixation.;  Surgeon: KyAshok PallMD;  [facility];  Service: Neurosurgery;  Laterality: N/A;    There were no vitals filed for this visit.  Visit Diagnosis:  Abnormality of gait  Unsteadiness  Generalized muscle weakness  Poor motor control of trunk      Subjective Assessment - 05/15/15 1945    Subjective Pt reports [facility] Pt states, "I used to go to a balance class there. My wife still goes; she says they've been asking about me."   Pertinent History Basal ganglia infarct 02/10/15 with expressive aphasia; prostate cancer, skin cancer, multiple myeloma (active; chemotherapy on Wednesdays)   Limitations Walking;House hold activities   Patient Stated Goals "To be able to function without the walker (rollator); better balance."   Currently in Pain? No/denies                         [facility] Adult PT Treatment/Exercise - 05/15/15 0001    Transfers   Transfers Sit to Stand;Stand to Sit   Sit to Stand 6: Modified independent (Device/Increase time);5: Supervision   Stand to Sit 6: Modified independent (Device/Increase time)   Ambulation/Gait   Ambulation/Gait Yes   Ambulation/Gait Assistance 4: Min guard;4: Min assist   Ambulation Distance (Feet) 550 Feet  over outdoor, paved surfaces with [facility] Assistive device Straight cane   Gait Pattern Step-through pattern;Decreased hip/knee flexion - right;Decreased dorsiflexion - right;Decreased weight shift to left;Right foot flat;Trunk flexed;Poor foot clearance - right;Shuffle;Scissoring   Ambulation Surface Level;Unlevel;Indoor;Outdoor;Paved   Gait Comments To illustrate pt need for hands-on assistance for outdoor ambulation with [facility] x550' over unlevel, paved surfaces with SPC. Provided verbal cueing to increase pt awareness of intermittent foot drag, posterior/lateral losses of balance.  Self-Care   Self-Care Other Self-Care Comments   Other Self-Care Comments  Discussed options for community fitness after DC from PT. Pt planning to participate in balance classes at [facility] with wife. [facility] does have NuStep machines. Therefore, explained and demonstrated how to safely use NuStep, including getting on/off NuStep, properly adjusting seat and hand controls, and use of time, resistance, and steps/minute to achieve CV/muscular  endurance or strength. Pt verbalized understand and gave effective return demonstration of all education provided.   Neuro Re-ed    Neuro Re-ed Details  HEP: added retro gait with single UE support at countertop to address stepping strategi with posterior LOB. See Pt Instructions for details.                PT Education - 05/15/15 1947    Education provided Yes   Education Details HEP: added retro gait for stepping strategy. Discussed POC, tentative plan for D/C from PT. Recommending wife attend last scheduled PT session. Discussed community fitness plan after DC from PT.   Person(s) Educated Patient   Methods Explanation;Demonstration;Handout;Verbal cues   Comprehension Verbalized understanding;Returned demonstration          PT Short Term Goals - 04/24/15 1427    PT SHORT TERM GOAL #1   Title Pt will perform home exercises with mod I using paper handout to indicate safe HEP compliance. Target date: 03/26/15   Baseline Met 8/15.   Status Achieved   PT SHORT TERM GOAL #2   Title Perform Berg Balance Scale to assess stability with functional standing balance.  Target date: 03/26/15   Baseline 8/1: score 29/56   Status Achieved   PT SHORT TERM GOAL #3   Title Pt will decrease TUG time from 21.58 to 17.5 seconds or less to indicate improved efficiency of functional mobility.  Target date: 03/26/15   Baseline 9/1: 15.94 sec (average of 3 trials) with rollator   Status Achieved   PT SHORT TERM GOAL #4   Title Pt will negotiate standard curb step with mod I using LRAD to indicate increased safety/independence with community mobility.  Target date: 03/26/15   Baseline Met 8/15.   Status Achieved   PT SHORT TERM GOAL #5   Title Pt will ambulate at self-selected gait speed of 2.63 ft/sec to reach status of community ambulator. Target date: 03/26/15   Baseline 8/15: 2.65 ft/sec with rollator   Status Achieved   PT SHORT TERM GOAL #6   Title Pt will perform sit > stand from standard  chair without UE support to indicate increased functional LE strength.  Target date: 03/26/15   Baseline 8/23: sit > stand without UE support with supervision, cueing for full anterior weight shift   Status Partially Met           PT Long Term Goals - 05/10/15 1150    PT LONG TERM GOAL #1   Title Pt/spouse will verbalize understanding of CVA warning signs to indicate understanding of situation in which to seek medical attention. Target date: 04/23/15.   Baseline Met 9/22.   Status Achieved   PT LONG TERM GOAL #2   Title Pt will ambulate x200' over indoor, level surfaces with S using SPC to indicate increased pt independence with household mobility, progress toward pt-stated goal. Modified target date: 05/24/15.   Baseline 05/10/15 revised goal to S level due to needing intermittent min/guard and inconsisten progress.    Status Revised   PT LONG TERM GOAL #3  Title Pt will perform TUG test in < 13.5 sconds to indicate decreased risk of falling. Target date: 04/23/15.   Baseline 05/10/15 not met, defer goal as this is no longer goal of therapy sessions.    Status Deferred   PT LONG TERM GOAL #4   Title Pt will increase Berg Balance Scale score by 6 points from baseline to indicate improvement in functional standing balance. Target date: 04/23/15.   Baseline 8/1: score 29/56 ;   9/20: score 44/56   Status Achieved   PT LONG TERM GOAL #5   Title Pt will ambulate x500' over unlevel, paved surfaces with mod I using single point cane with no overt LOB to indicate increased safety with community mobility, progress toward pt-stated goal of walking with SPC.  Modified target date: 05/24/15.   Baseline Pt continues to require min/guard with SPC, therefore feel that this goal no longer safe at this time and will defer goal.    Status Deferred   PT LONG TERM GOAL #6   Title Pt will negotiate 4 stairs with 1 rail with mod I using LRAD to indicate increased independence with community access. Target date:  04/23/15.   Baseline Met 9/22.   Status Achieved   PT LONG TERM GOAL #7   Title Pt will negotiate standard ramp and curb step with mod I using SPC to indicate safety traversing community obstacles. Modified target date: 05/24/15   Baseline Requires S and due to deferred community ambulation with SPC goal, will defer this goal.  05/10/15   Status Deferred   PT LONG TERM GOAL #8   Title Pt will perform floor transfer with mod I using standard chair to enble pt to safely recover from fall. Target date: 05/24/15.   Status New               Plan - 05/15/15 2000    Clinical Impression Statement Skilled session focused on preparing pt to continue participating in community exercise upon DC from outpatient PT. Pt planning to participate in community balance classes at [facility]. Educated pt on safe use of equipment. Discussed upcoming end of PT POC and reiterated importance of wife providing supervision for houshold ambulation with cane. Recommending pt use rollator for all outdoor ambulation. Requesting wife attend final (next scheduled) PT session to ensure carryover of education provided. Continue per POC.   Pt will benefit from skilled therapeutic intervention in order to improve on the following deficits Abnormal gait;Decreased balance;Difficulty walking;Decreased strength;Postural dysfunction;Decreased knowledge of use of DME;Decreased activity tolerance;Decreased endurance   Rehab Potential Good   Clinical Impairments Affecting Rehab Potential Currently undergoing chemotherapy 1x/week   PT Frequency 2x / week   PT Duration 4 weeks   PT Treatment/Interventions ADLs/Self Care Home Management;Gait training;Therapeutic exercise;Patient/family education;Stair training;Balance training;Functional mobility training;Neuromuscular re-education;Manual techniques;Therapeutic activities;DME Instruction;Vestibular;Orthotic Fit/Training   PT Next Visit Plan Educate wife on recommendations  (supervision with [place] indoors; use of rollator outdoors). Check goals and DC. GCODES.   Consulted and Agree with Plan of Care Patient        Problem List Patient Active Problem List   Diagnosis Date Noted  . Diarrhea 04/25/2015  . Cancer associated pain 04/25/2015  . Long term current use of anticoagulant therapy 04/25/2015  . Basal ganglia infarction ([place]) 02/12/2015  . Dizziness   . Expressive aphasia   . Past pointing   . Right leg weakness   . Acute ischemic stroke ([place]) 02/11/2015  . CVA (cerebral infarction) 02/11/2015  .  Aspiration pneumonia ([place]) 02/11/2015  . Aphasia   . Difficulty speaking   . Speech difficult to understand   . Stroke ([place])   . Encounter for antineoplastic chemotherapy 01/31/2015  . Constipation 12/20/2014  . Rash 12/20/2014  . Cellulitis 12/06/2014  . Peripheral edema 12/06/2014  . Gait disorder 09/05/2014  . Multiple myeloma ([place])   . Paraplegia ([place]) 06/21/2014  . Postoperative anemia due to acute blood loss 06/21/2014  . 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 Spine metastasis ([place]) 06/17/2014  . Thoracic spine tumor 06/17/2014  . Metastatic bone cancer ([place]) 06/15/2014    Billie Ruddy, PT, [place] [address] [place], [place], [address] Phone: [phone]   Fax:  [phone] 05/15/2015, 8:06 PM

## 2015-05-15 NOTE — Telephone Encounter (Signed)
i have ordered an MBS to be done at [facility].  thanks

## 2015-05-15 NOTE — Telephone Encounter (Signed)
-----   Message from Sharen Counter, [place] sent at 05/15/2015  8:27 AM EDT ----- Dr. Naaman Plummer- This patient has been in [place] for approx 9 weeks and would like to repeat his MBSS. He had some silent penetration that didn't clear his last test back in July. I can't really assess whether or not that has improved here in the clinic.  We are wrapping up end of this month with aphasia tx. If agreed could you order via EPIC?  Thanks. Glendell Docker

## 2015-05-15 NOTE — Therapy (Signed)
[facility] [address] [place], [place], [address] Phone: [phone]   Fax:  [phone]  Speech Language Pathology Treatment  Patient Details  Name: Jeremiah Owens MRN: [id number] Date of Birth: [birth year] Referring Provider:  Wenda Low, MD  Encounter Date: 05/15/2015      End of Session - 05/15/15 1217    Visit Number 20   Number of Visits 24   Date for SLP Re-Evaluation 06/01/15   Authorization Type G-CODE!    SLP Start Time 0803   SLP Stop Time  0846   SLP Time Calculation (min) 43 min   Activity Tolerance Patient tolerated treatment well      Past Medical History  Diagnosis Date  . Compression fracture   . Allergy   . Anxiety   . Thyroid disease   . S/P radiation therapy 08/21/14-09/04/14    T4-6 25Gy/13f  . S/P radiation therapy 11/29/14-12/12/14    rt prox humerus/shoulder/scapula 25Gy/156f . Encounter for antineoplastic chemotherapy 01/31/2015  . [place]anoma ([facility]  . Bone cancer ([facility]    t_spine T-5  . Prostate cancer ([facility]  . Skin cancer 1994    melanoma  right neck   . Multiple myeloma (HCS[facility] [place]  . Hearing loss   . [facility]    Past Surgical History  Procedure Laterality Date  . Hernia repair    . Posterior cervical fusion/foraminotomy N/A 06/17/2014    Procedure: Thoracic five laminectomy for epidural tumor resection Thoracic 4-7 posterior lateral arthrodesis, segmental pedicle screw fixation.;  Surgeon: KyAshok PallMD;  [facility];  Service: Neurosurgery;  Laterality: N/A;    There were no vitals filed for this visit.  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 Wife has been filling out checks at home.               ADULT SLP TREATMENT - 05/15/15 0821    General Information   Behavior/Cognition Alert;Cooperative;Pleasant  mood   Treatment Provided   Treatment provided Cognitive-Linquistic   Dysphagia Treatment   Other treatment/comments denies overt s/s aspiration PNA. None noted today.   Pain Assessment   Pain Assessment No/denies pain   Cognitive-Linquistic Treatment   Treatment focused on Aphasia   Skilled Treatment Pt filled out checks with 93% success (wrote "hundred" incorrectly). He is not following his swallowing precuation of head turn right. "My wife finishes a lot sooner than I do." - pt maintains slower rate and smaller bites/sips.  SLP requested follow up modified due to silent penetration not clearing during last study. Hope is to assess pt's swallowing to have restrictions removed at this point. Pt related he had difficulty with dual meaning words on homework so SLP targeted this in structured tasks. Pt req'd consistent mod A with this structured linguistic task.   Assessment / Recommendations / Plan   Plan Continue with current plan of care   Progression Toward Goals   Progression toward goals Progressing toward goals          SLP Education - 05/15/15 0853    Education provided Yes          SLP Short Term Goals - 04/26/15 1055    SLP SHORT TERM GOAL #1   Title pt will produce sentence responses in mod complex tasks 80% and rare min A  Status Not Met   SLP SHORT TERM GOAL #2   Title pt will demo HEP for swallowing with rare min A   Status Achieved   SLP SHORT TERM GOAL #3   Title pt will demo compensatory strategies for anomia with occasasional min A   Status Not Met   SLP SHORT TERM GOAL #4   Title Pt's signature/ basic demographic info will not show any signs of aphasia   Status Partially Met          SLP Long Term Goals - 05-19-2015 2751    SLP LONG TERM GOAL #1   Title pt will demo 10 minutes mod complex conversation fucntionally with occasional verbal cues   Baseline Continue goal upon renewal 04/30/15  to 06/03/15   Time 2   Period Weeks   Status Revised   SLP LONG  TERM GOAL #2   Title pt will demo HEP for swallowing with modified independence   Baseline Continue goal upon renewal 04/30/15 to 06/03/15   Time 2   Period Weeks   Status On-going   SLP LONG TERM GOAL #3   Title pt will write checks adequately with rare min cues   Baseline Continue goal upon renewal 04/30/15 to 06/03/15   Status Achieved          Plan - 2015-05-19 1217    Clinical Impression Statement Pt requires continued skilled ST to maximize verbal expression and written expression as aphasia persists requiring min to mod A to participate in conversation due to aphasia . Check-writing is functional/WNL. Recommend ST continue for the near future to improve verbal expression.   Speech Therapy Frequency 2x / week   Duration 2 weeks   Treatment/Interventions Internal/external aids;Compensatory strategies;SLP instruction and feedback;Patient/family education;Functional tasks;Cueing hierarchy;Pharyngeal strengthening exercises;Oral motor exercises;Compensatory techniques;Diet toleration management by SLP   Potential to Achieve Goals Fair   Potential Considerations Severity of impairments          G-Codes - 05/19/15 1345    Functional Assessment Tool Used noms   Functional Limitations Spoken language expressive   Spoken Language Expression Current Status [phone]) At least 20 percent but less than 40 percent impaired, limited or restricted   Spoken Language Expression Goal Status ([id number]) At least 20 percent but less than 40 percent impaired, limited or restricted      Speech Therapy Progress Note  Dates of Reporting Period: 02-23-15 to present  Objective Reports of Subjective Statement: Pt's swallow skills have improved, as well as his spoken language. He is now able to write checks with extra time. A follow up modified barium swallow exam has been recommended.  Objective Measurements: Pt's success with expressive language has improved over the last 3-4 weeks.  Goal Update: pt  partially met short term goals, and no long term goals were met before the latest recertification [id number]. Pt has met one of three (for check writing) long term goals. One goal has had to be modified.   Plan: Pt will be seen for approx 2 more weeks to continue work on expressive language.  Reason Skilled Services are Required: Pt's QOL has been reduced due to reduced skills with expressive language. Skilled ST needs to cont to encourage use of compensations.    Problem List Patient Active Problem List   Diagnosis Date Noted  . Diarrhea 04/25/2015  . Cancer associated pain 04/25/2015  . Long term current use of anticoagulant therapy 04/25/2015  . Basal ganglia infarction ([place]) 02/12/2015  . Dizziness   .  Expressive aphasia   . Past pointing   . Right leg weakness   . Acute ischemic stroke ([place]) 02/11/2015  . CVA (cerebral infarction) 02/11/2015  . Aspiration pneumonia ([place]) 02/11/2015  . Aphasia   . Difficulty speaking   . Speech difficult to understand   . Stroke ([place])   . Encounter for antineoplastic chemotherapy 01/31/2015  . Constipation 12/20/2014  . Rash 12/20/2014  . Cellulitis 12/06/2014  . Peripheral edema 12/06/2014  . Gait disorder 09/05/2014  . Multiple myeloma ([place])   . Paraplegia ([place]) 06/21/2014  . Postoperative anemia due to acute blood loss 06/21/2014  . 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 Spine metastasis ([place]) 06/17/2014  . Thoracic spine tumor 06/17/2014  . Metastatic bone cancer ([place]) 06/15/2014    Anzel Kearse , MS, [place]  05/15/2015, 1:46 PM  [place] [address] [place], [place], [address] Phone: [phone]   Fax:  [phone]

## 2015-05-16 ENCOUNTER — Other Ambulatory Visit: Payer: Medicare Other

## 2015-05-16 ENCOUNTER — Other Ambulatory Visit (HOSPITAL_BASED_OUTPATIENT_CLINIC_OR_DEPARTMENT_OTHER): Payer: Medicare Other

## 2015-05-16 ENCOUNTER — Other Ambulatory Visit: Payer: Self-pay | Admitting: *Deleted

## 2015-05-16 ENCOUNTER — Ambulatory Visit (HOSPITAL_BASED_OUTPATIENT_CLINIC_OR_DEPARTMENT_OTHER): Payer: Medicare Other

## 2015-05-16 VITALS — BP 126/43 | HR 77 | Temp 99.0°F | Resp 18

## 2015-05-16 DIAGNOSIS — Z5112 Encounter for antineoplastic immunotherapy: Secondary | ICD-10-CM

## 2015-05-16 DIAGNOSIS — C9000 Multiple myeloma not having achieved remission: Secondary | ICD-10-CM

## 2015-05-16 DIAGNOSIS — C419 Malignant neoplasm of bone and articular cartilage, unspecified: Secondary | ICD-10-CM

## 2015-05-16 LAB — CBC WITH DIFFERENTIAL/PLATELET
BASO%: 0.2 % (ref 0.0–2.0)
Basophils Absolute: 0 10*3/uL (ref 0.0–0.1)
EOS ABS: 0 10*3/uL (ref 0.0–0.5)
EOS%: 1.1 % (ref 0.0–7.0)
HCT: 39.3 % (ref 38.4–49.9)
HEMOGLOBIN: 13.1 g/dL (ref 13.0–17.1)
LYMPH%: 11.2 % — ABNORMAL LOW (ref 14.0–49.0)
MCH: 32.6 pg (ref 27.2–33.4)
MCHC: 33.2 g/dL (ref 32.0–36.0)
MCV: 98.1 fL — AB (ref 79.3–98.0)
MONO#: 0.1 10*3/uL (ref 0.1–0.9)
MONO%: 3 % (ref 0.0–14.0)
NEUT%: 84.5 % — ABNORMAL HIGH (ref 39.0–75.0)
NEUTROS ABS: 2.7 10*3/uL (ref 1.5–6.5)
PLATELETS: 67 10*3/uL — AB (ref 140–400)
RBC: 4 10*6/uL — ABNORMAL LOW (ref 4.20–5.82)
RDW: 16.8 % — AB (ref 11.0–14.6)
WBC: 3.2 10*3/uL — AB (ref 4.0–10.3)
lymph#: 0.4 10*3/uL — ABNORMAL LOW (ref 0.9–3.3)

## 2015-05-16 LAB — BASIC METABOLIC PANEL (CC13)
Anion Gap: 8 mEq/L (ref 3–11)
BUN: 16.2 mg/dL (ref 7.0–26.0)
CHLORIDE: 102 meq/L (ref 98–109)
CO2: 31 mEq/L — ABNORMAL HIGH (ref 22–29)
Calcium: 9.1 mg/dL (ref 8.4–10.4)
Creatinine: 1 mg/dL (ref 0.7–1.3)
EGFR: 73 mL/min/{1.73_m2} — ABNORMAL LOW (ref >90)
Glucose: 121 mg/dl (ref 70–140)
POTASSIUM: 4.1 meq/L (ref 3.5–5.1)
SODIUM: 140 meq/L (ref 136–145)

## 2015-05-16 MED ORDER — BORTEZOMIB CHEMO SQ INJECTION 3.5 MG (2.5MG/ML)
1.3000 mg/m2 | Freq: Once | INTRAMUSCULAR | Status: AC
  Administered 2015-05-16: 2.5 mg via SUBCUTANEOUS
  Filled 2015-05-16: qty 2.5

## 2015-05-16 MED ORDER — ZOLEDRONIC ACID 4 MG/100ML IV SOLN
4.0000 mg | Freq: Once | INTRAVENOUS | Status: AC
  Administered 2015-05-16: 4 mg via INTRAVENOUS
  Filled 2015-05-16: qty 100

## 2015-05-16 MED ORDER — ONDANSETRON 8 MG PO TBDP
8.0000 mg | ORAL_TABLET | Freq: Once | ORAL | Status: AC
  Administered 2015-05-16: 8 mg via ORAL
  Filled 2015-05-16: qty 1

## 2015-05-16 MED ORDER — SODIUM CHLORIDE 0.9 % IV SOLN
INTRAVENOUS | Status: DC
Start: 2015-05-16 — End: 2015-05-16
  Administered 2015-05-16: 15:00:00 via INTRAVENOUS

## 2015-05-16 MED ORDER — SODIUM CHLORIDE 0.9 % IJ SOLN
10.0000 mL | INTRAMUSCULAR | Status: DC | PRN
  Filled 2015-05-16: qty 10

## 2015-05-16 NOTE — Progress Notes (Signed)
Platelets 67 today. Spoke with Dr. Julien Nordmann, per MD okay to treat.

## 2015-05-16 NOTE — Patient Instructions (Signed)
[facility] Discharge Instructions for Patients Receiving Chemotherapy  Today you received the following chemotherapy agents velcade, zometa  To help prevent nausea and vomiting after your treatment, we encourage you to take your nausea medication   If you develop nausea and vomiting that is not controlled by your nausea medication, call the clinic.   BELOW ARE SYMPTOMS THAT SHOULD BE REPORTED IMMEDIATELY:  *FEVER GREATER THAN 100.5 F  *CHILLS WITH OR WITHOUT FEVER  NAUSEA AND VOMITING THAT IS NOT CONTROLLED WITH YOUR NAUSEA MEDICATION  *UNUSUAL SHORTNESS OF BREATH  *UNUSUAL BRUISING OR BLEEDING  TENDERNESS IN MOUTH AND THROAT WITH OR WITHOUT PRESENCE OF ULCERS  *URINARY PROBLEMS  *BOWEL PROBLEMS  UNUSUAL RASH Items with * indicate a potential emergency and should be followed up as soon as possible.  Feel free to call the clinic you have any questions or concerns. The clinic phone number is [phone].  Please show the [facility] at check-in to the Emergency Department and triage nurse.

## 2015-05-17 ENCOUNTER — Ambulatory Visit: Payer: Medicare Other

## 2015-05-17 ENCOUNTER — Ambulatory Visit: Payer: Medicare Other | Admitting: Physical Therapy

## 2015-05-17 DIAGNOSIS — R2991 Unspecified symptoms and signs involving the musculoskeletal system: Secondary | ICD-10-CM

## 2015-05-17 DIAGNOSIS — R4701 Aphasia: Secondary | ICD-10-CM

## 2015-05-17 DIAGNOSIS — R1313 Dysphagia, pharyngeal phase: Secondary | ICD-10-CM

## 2015-05-17 DIAGNOSIS — R269 Unspecified abnormalities of gait and mobility: Secondary | ICD-10-CM | POA: Diagnosis not present

## 2015-05-17 DIAGNOSIS — R29818 Other symptoms and signs involving the nervous system: Secondary | ICD-10-CM

## 2015-05-17 DIAGNOSIS — R2681 Unsteadiness on feet: Secondary | ICD-10-CM | POA: Diagnosis not present

## 2015-05-17 DIAGNOSIS — I69359 Hemiplegia and hemiparesis following cerebral infarction affecting unspecified side: Secondary | ICD-10-CM

## 2015-05-17 DIAGNOSIS — M6281 Muscle weakness (generalized): Secondary | ICD-10-CM | POA: Diagnosis not present

## 2015-05-17 NOTE — Patient Instructions (Addendum)
Leg Extension (Hamstring)   Sit toward front edge of chair, with leg out straight, heel on floor, toes pointing toward body. Hold for 60 seconds. Relax. Repeat on other leg.  Do this twice per day on each leg.  HIP / KNEE: Extension - Sit to Stand   Stand up from standard chair using only one hand.. Be sure your belly button is forward (as we discussed during PT session) before starting. Also, be sure you're leaning forward ("nose over knees") to stand. Do 15 stands, 3 times per day.          AMBULATION: Walk Backward    Stand next to your countertop with one hand on the counter. Walk backward. Take large steps, do not drag feet. Walk backwards for the length of your corner countertop 4 times consecutively. Do this 2-3 times per day.  Progress by changing from entire hand on countertop to 3-finger support as your balance improves.    Feet Together, Head Motion - Eyes Open    Stand with back to corner with chair in front of you (in case of loss of balance). With eyes open, feet together, move head slowly: up and down 10 times. Then, move head from right to left 10 times. Perform exercise 3 times per day.   Feet Apart (Compliant Surface) Head Motion - Eyes Open    Stand with back to corner with chair in front of you (in case of loss of balance). While standing on a pillow with your eyes open, move head slowly: up and down 10 times. Then, move head from right to left 10 times. Perform exercise 3 times per day.   Feet Heel-Toe "Tandem", Varied Arm Positions - Eyes Open    Stand with back to corner with chair in front of you (in case of loss of balance). With your eyes open and arms by your side, place your right heel next to your left toes. Look straight ahead at a stationary object. Hold __30__ seconds, then switch to the same position with the left leg in front. Perform exercise 3 times per day.

## 2015-05-17 NOTE — Therapy (Signed)
[facility] [address] [place], [place], [address] Phone: [phone]   Fax:  [phone]  Physical Therapy Treatment  Patient Details  Name: Jeremiah Owens MRN: [id number] Date of Birth: Sep 10, 1929 No Data Recorded  Encounter Date: 05/17/2015      PT End of Session - 05/17/15 2259    Visit Number 22   Number of Visits 24   Date for PT Re-Evaluation 05/26/15   Authorization Type KX;  Medicare Trad Primary - G codes required every 10 visits   PT Start Time 1403   PT Stop Time 1446   PT Time Calculation (min) 43 min   Activity Tolerance Patient tolerated treatment well   Behavior During Therapy WFL for tasks assessed/performed      Past Medical History  Diagnosis Date  . Compression fracture   . Allergy   . Anxiety   . Thyroid disease   . S/P radiation therapy 08/21/14-09/04/14    T4-6 25Gy/87f  . S/P radiation therapy 11/29/14-12/12/14    rt prox humerus/shoulder/scapula 25Gy/184f . Encounter for antineoplastic chemotherapy 01/31/2015  . [place]anoma ([facility]  . Bone cancer ([facility]    t_spine T-5  . Prostate cancer ([facility]  . Skin cancer 1994    melanoma  right neck   . Multiple myeloma ([facility]  . Hearing loss   . [facility]    Past Surgical History  Procedure Laterality Date  . Hernia repair    . Posterior cervical fusion/foraminotomy N/A 06/17/2014    Procedure: Thoracic five laminectomy for epidural tumor resection Thoracic 4-7 posterior lateral arthrodesis, segmental pedicle screw fixation.;  Surgeon: KyAshok PallMD;  [facility];  Service: Neurosurgery;  Laterality: N/A;    There were no vitals filed for this visit.  Visit Diagnosis:  Abnormality of gait  Unsteadiness  Generalized muscle weakness  Poor motor control of trunk  [facility]     Subjective Assessment - 05/17/15 2240    Subjective Pt accompanied by wife today. Wife in  agreement that pt is more unsteady after chemotherapy on Wednsdays. Wife reports she is safe to provide supervision for household ambulation with SPC.   Patient is accompained by: Family member  wife, [name] Pertinent History Basal ganglia infarct 02/10/15 with expressive aphasia; prostate cancer, skin cancer, multiple myeloma (active; chemotherapy on Wednesdays)   Limitations Walking;House hold activities   Patient Stated Goals "To be able to function without the walker (rollator); better balance."   Currently in Pain? No/denies                         [facility]/Exercise - 05/17/15 0001    Therapeutic Activites    Therapeutic Activities Other Therapeutic Activities   Other Therapeutic Activities Transferred from seated on mat table <> long sitting on floor with mod I, increased time. Without cueing, pt able to verbalize situaitons in which he should not attempt to get out of floor, but should instead call EMS.   Neuro Re-ed    Neuro Re-ed Details  HEP: corner balance exercises added. See Pt Instructions for details on exercises, duration, and reps. Pt performed all exercises with min cueing from this PT.   Exercises   Exercises Other Exercises   Other Exercises  Pt performed all prior home exercises; PT progressed to pt tolerance. See Pt Instructions for details on all exericises, reps, and  sets.                PT Education - 05/17/15 2241    Education provided Yes   Education Details Goals, progress, and D/C plan. Recommending wife provide (S) for all household mobility with SPC. Added standing balance exercises to HEP. Recommending use of rollator 48 hours after chemotherapy due to fatigue/gait instability.   Person(s) Educated Patient;Spouse   Methods Explanation;Demonstration;Verbal cues;Handout   Comprehension Verbalized understanding;Returned demonstration          PT Short Term Goals - 04/24/15 1427    PT SHORT TERM GOAL #1   Title Pt will  perform home exercises with mod I using paper handout to indicate safe HEP compliance. Target date: 03/26/15   Baseline Met 8/15.   Status Achieved   PT SHORT TERM GOAL #2   Title Perform Berg Balance Scale to assess stability with functional standing balance.  Target date: 03/26/15   Baseline 8/1: score 29/56   Status Achieved   PT SHORT TERM GOAL #3   Title Pt will decrease TUG time from 21.58 to 17.5 seconds or less to indicate improved efficiency of functional mobility.  Target date: 03/26/15   Baseline 9/1: 15.94 sec (average of 3 trials) with rollator   Status Achieved   PT SHORT TERM GOAL #4   Title Pt will negotiate standard curb step with mod I using LRAD to indicate increased safety/independence with community mobility.  Target date: 03/26/15   Baseline Met 8/15.   Status Achieved   PT SHORT TERM GOAL #5   Title Pt will ambulate at self-selected gait speed of 2.63 ft/sec to reach status of community ambulator. Target date: 03/26/15   Baseline 8/15: 2.65 ft/sec with rollator   Status Achieved   PT SHORT TERM GOAL #6   Title Pt will perform sit > stand from standard chair without UE support to indicate increased functional LE strength.  Target date: 03/26/15   Baseline 8/23: sit > stand without UE support with supervision, cueing for full anterior weight shift   Status Partially Met           PT Long Term Goals - 05/17/15 1414    PT LONG TERM GOAL #1   Title Pt/spouse will verbalize understanding of CVA warning signs to indicate understanding of situation in which to seek medical attention. Target date: 04/23/15.   Baseline Met 9/22.   Status Achieved   PT LONG TERM GOAL #2   Title Pt will ambulate x200' over indoor, level surfaces with S using SPC to indicate increased pt independence with household mobility, progress toward pt-stated goal. Modified target date: 05/24/15.   Baseline 05/10/15 revised goal to S level due to needing intermittent min/guard and inconsisten progress.     Status Achieved   PT LONG TERM GOAL #3   Title Pt will perform TUG test in < 13.5 sconds to indicate decreased risk of falling. Target date: 04/23/15.   Baseline 05/10/15 not met, defer goal as this is no longer goal of therapy sessions.    Status Not Met   PT LONG TERM GOAL #4   Title Pt will increase Berg Balance Scale score by 6 points from baseline to indicate improvement in functional standing balance. Target date: 04/23/15.   Baseline 8/1: score 29/56 ;   9/20: score 44/56   Status Achieved   PT LONG TERM GOAL #5   Title Pt will ambulate x500' over unlevel, paved surfaces with mod I using single point  cane with no overt LOB to indicate increased safety with community mobility, progress toward pt-stated goal of walking with SPC.  Modified target date: 05/24/15.   Baseline Pt continues to require min/guard with SPC, therefore feel that this goal no longer safe at this time and will defer goal.    Status Not Met   PT LONG TERM GOAL #6   Title Pt will negotiate 4 stairs with 1 rail with mod I using LRAD to indicate increased independence with community access. Target date: 04/23/15.   Baseline Met 9/22.   Status Achieved   PT LONG TERM GOAL #7   Title Pt will negotiate standard ramp and curb step with mod I using SPC to indicate safety traversing community obstacles. Modified target date: 05/24/15   Baseline Requires S and due to deferred community ambulation with SPC goal, will defer this goal.  05/10/15   Status Not Met   PT LONG TERM GOAL #8   Title Pt will perform floor transfer with mod I using standard chair to enble pt to safely recover from fall. Target date: 05/24/15.   Baseline Met 05/24/2023   Status Achieved               Plan - 2015-05-24 2300    Clinical Impression Statement Pt has met 5 of 8 LTG's and will be discharged from outpatient PT due to pt having maximized functional gains in PT at this time. Provided corner balance HEP; pt plans to attend balance classes and  continue exercise at [facility]. Recommending wife provide supervision at all times when pt utilizing [facility] for household ambulation. Also recommending pt use rollator for all community mobility and for at least 48 hours after chemotherapy due to fatigue/gait instability. Pt/wife verbalized understanding and were in agreeement with all education and DC plan.   PT Next Visit Plan N/A, as pt has been discharged from outpatient PT at this time.   Consulted and Agree with Plan of Care Family member/caregiver;Patient   Family Member Consulted wife, Andrez Grime - May 24, 2015 2303/09/11    Functional Assessment Tool Used TUG: 15.03 sec w/ SPC   Functional Limitation Mobility: Walking and moving around   Mobility: Walking and Moving Around Goal Status [phone]) At least 1 percent but less than 20 percent impaired, limited or restricted   Mobility: Walking and Moving Around Discharge Status [phone]) At least 20 percent but less than 40 percent impaired, limited or restricted      Problem List Patient Active Problem List   Diagnosis Date Noted  . Diarrhea 04/25/2015  . Cancer associated pain 04/25/2015  . Long term current use of anticoagulant therapy 04/25/2015  . Basal ganglia infarction ([place]) 02/12/2015  . Dizziness   . Expressive aphasia   . Past pointing   . Right leg weakness   . Acute ischemic stroke ([place]) 02/11/2015  . CVA (cerebral infarction) 02/11/2015  . Aspiration pneumonia ([place]) 02/11/2015  . Aphasia   . Difficulty speaking   . Speech difficult to understand   . Stroke ([place])   . Encounter for antineoplastic chemotherapy 01/31/2015  . Constipation 12/20/2014  . Rash 12/20/2014  . Cellulitis 12/06/2014  . Peripheral edema 12/06/2014  . Gait disorder 09/05/2014  . Multiple myeloma ([place])   . Paraplegia ([place]) 06/21/2014  . Postoperative anemia due to acute blood loss 06/21/2014  . 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 Spine metastasis ([place]) 06/17/2014  . Thoracic  spine  tumor 06/17/2014  . Metastatic bone cancer ([place]) 06/15/2014    PHYSICAL THERAPY DISCHARGE SUMMARY  Visits from Start of Care: 22  Current functional level related to goals / functional outcomes: See above goals and statuses.   Remaining deficits: During ambulation, pt continues to exhibit require supervision with turns, direction changes, head turns, dual tasking, and when ambulating over unlevel surfaces. Pt continues to exhibit balance deficits and postural impairments.   Education / Equipment: HEP for strength and balance; safe equipment use at [employer] center; recommendations for use of SPC, rollator; CVA warning signs and risk factors; fall prevention strategies; fall recovery   Plan: Patient agrees to discharge.  Patient goals were partially met. Patient is being discharged due to lack of progress.  ?????        Billie Ruddy, PT, DPT [facility] [address] [place], [place], [address] Phone: [phone]   Fax:  [phone] 05/17/2015, 11:06 PM     Name: Isaul Landi MRN: [id number] Date of Birth: [birth year]

## 2015-05-17 NOTE — Therapy (Signed)
[facility] [address] [place], [place], [address] Phone: [phone]   Fax:  [phone]  Speech Language Pathology Treatment  Patient Details  Name: Jeremiah Owens MRN: [id number] Date of Birth: Mar 10, 1930 Referring Provider:  Wenda Low, MD  Encounter Date: 05/17/2015      End of Session - 05/17/15 1658    Visit Number 21   Number of Visits 24   Date for SLP Re-Evaluation 06/01/15   SLP Start Time 78   SLP Stop Time  1402   SLP Time Calculation (min) 42 min   Activity Tolerance Patient tolerated treatment well      Past Medical History  Diagnosis Date  . Compression fracture   . Allergy   . Anxiety   . Thyroid disease   . S/P radiation therapy 08/21/14-09/04/14    T4-6 25Gy/24f  . S/P radiation therapy 11/29/14-12/12/14    rt prox humerus/shoulder/scapula 25Gy/112f . Encounter for antineoplastic chemotherapy 01/31/2015  . Melanoma ([facility]  . Bone cancer (HCG[place]    t_spine T-5  . Prostate cancer (HC[facility]2002  . Skin cancer 1994    melanoma  right neck   . Multiple myeloma (HCK[place]  . Hearing loss   . [facility]    Past Surgical History  Procedure Laterality Date  . Hernia repair    . Posterior cervical fusion/foraminotomy N/A 06/17/2014    Procedure: Thoracic five laminectomy for epidural tumor resection Thoracic 4-7 posterior lateral arthrodesis, segmental pedicle screw fixation.;  Surgeon: KyAshok PallMD;  [facility];  Service: Neurosurgery;  Laterality: N/A;    There were no vitals filed for this visit.  Visit Diagnosis: Expressive aphasia  Pharyngeal dysphagia      Subjective Assessment - 05/17/15 1327    Subjective "Boy, you hit on what I don't know." (re: homework for dual meaning words)   Currently in Pain? No/denies               ADULT SLP TREATMENT - 05/17/15 1328    General Information   Behavior/Cognition Alert;Cooperative;Pleasant mood   Treatment  Provided   Treatment provided Dysphagia   Pain Assessment   Pain Assessment No/denies pain   Cognitive-Linquistic Treatment   Treatment focused on Aphasia   Skilled Treatment SLP focused on improving pt's verbal expression by having him define dual/triple meaning words. Pt req'd mod/max A usually for providing DEFINTIONS instead of sentences with the word in it. SLP took time to explain to pt that less-common/abstract concept have cont to be more difficult for pt to verbalize and that these areas may not improve notably in order to cont in therapy. However SLP encouraged pt to cont to perform homework as he desires for approx 6-8 weeks after discharge and reassess progress on whether to cont at same frequency with pt's structured tasks. SLP then reviewed compensations for anomia/dysnomia with pt and suggested the last 3-4 sessions focus more on these than on structured tasks which pt could perform at home on his own. Pt agreed.   Assessment / Recommendations / Plan   Plan Continue with current plan of care  focus more on compensations instead of structured tasks   Progression Toward Goals   Progression toward goals Not progressing toward goals (comment)          SLP Education - 05/17/15 1405    Education provided Yes   Education Details Prognosis for anomia, home tasks after d/c, compensations for expressive aphasia  Person(s) Educated Patient   Methods Explanation;Demonstration   Comprehension Verbalized understanding          SLP Short Term Goals - 04/26/15 1055    SLP SHORT TERM GOAL #1   Title pt will produce sentence responses in mod complex tasks 80% and rare min A   Status Not Met   SLP SHORT TERM GOAL #2   Title pt will demo HEP for swallowing with rare min A   Status Achieved   SLP SHORT TERM GOAL #3   Title pt will demo compensatory strategies for anomia with occasasional min A   Status Not Met   SLP SHORT TERM GOAL #4   Title Pt's signature/ basic demographic info  will not show any signs of aphasia   Status Partially Met          SLP Long Term Goals - 05/17/15 1702    SLP LONG TERM GOAL #1   Title pt will demo 10 minutes simple to mod complex conversation fucntionally with occasional verbal cues   Baseline Continue goal upon renewal 04/30/15  to 06/03/15   Time 2   Period Weeks   Status Revised   SLP LONG TERM GOAL #2   Title pt will demo HEP for swallowing with modified independence   Baseline Continue goal upon renewal 04/30/15 to 06/03/15   Time 2   Period Weeks   Status On-going   SLP LONG TERM GOAL #3   Title pt will write checks adequately with rare min cues   Baseline Continue goal upon renewal 04/30/15 to 06/03/15   Status Achieved          Plan - 05/17/15 1658    Clinical Impression Statement Pt's success with more abstract wording/concepts, or less - common language cont as difficult. Pt's simple conversation is more successful and is functional than mod complex conversation, which cont as non-functional. Skilled ST to focus primarily on compensations for expressive aphasia rather than on heavy practice with structured tasks.   Speech Therapy Frequency 2x / week   Duration 2 weeks   Treatment/Interventions Internal/external aids;Compensatory strategies;SLP instruction and feedback;Patient/family education;Functional tasks;Cueing hierarchy;Pharyngeal strengthening exercises;Oral motor exercises;Compensatory techniques;Diet toleration management by SLP   Potential to Achieve Goals Fair   Potential Considerations Severity of impairments   Consulted and Agree with Plan of Care Patient        Problem List Patient Active Problem List   Diagnosis Date Noted  . Diarrhea 04/25/2015  . Cancer associated pain 04/25/2015  . Long term current use of anticoagulant therapy 04/25/2015  . Basal ganglia infarction ([place]) 02/12/2015  . Dizziness   . Expressive aphasia   . Past pointing   . Right leg weakness   . Acute ischemic stroke  ([place]) 02/11/2015  . CVA (cerebral infarction) 02/11/2015  . Aspiration pneumonia ([place]) 02/11/2015  . Aphasia   . Difficulty speaking   . Speech difficult to understand   . Stroke ([place])   . Encounter for antineoplastic chemotherapy 01/31/2015  . Constipation 12/20/2014  . Rash 12/20/2014  . Cellulitis 12/06/2014  . Peripheral edema 12/06/2014  . Gait disorder 09/05/2014  . Multiple myeloma ([place])   . Paraplegia ([place]) 06/21/2014  . Postoperative anemia due to acute blood loss 06/21/2014  . 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 Spine metastasis ([place]) 06/17/2014  . Thoracic spine tumor 06/17/2014  . Metastatic bone cancer ([place]) 06/15/2014    SCHINKE,CARL , [place], [place]  05/17/2015, 5:05 PM  [facility] [address] [place], [place], [address] Phone: [phone]   Fax:  [phone]

## 2015-05-17 NOTE — Patient Instructions (Signed)
  Please complete the assigned speech therapy homework prior to your next session.  

## 2015-05-22 ENCOUNTER — Ambulatory Visit: Payer: Medicare Other

## 2015-05-22 DIAGNOSIS — R4701 Aphasia: Secondary | ICD-10-CM

## 2015-05-22 DIAGNOSIS — M6281 Muscle weakness (generalized): Secondary | ICD-10-CM | POA: Diagnosis not present

## 2015-05-22 DIAGNOSIS — R269 Unspecified abnormalities of gait and mobility: Secondary | ICD-10-CM | POA: Diagnosis not present

## 2015-05-22 DIAGNOSIS — R1313 Dysphagia, pharyngeal phase: Secondary | ICD-10-CM | POA: Diagnosis not present

## 2015-05-22 DIAGNOSIS — R2681 Unsteadiness on feet: Secondary | ICD-10-CM | POA: Diagnosis not present

## 2015-05-22 DIAGNOSIS — R2991 Unspecified symptoms and signs involving the musculoskeletal system: Secondary | ICD-10-CM | POA: Diagnosis not present

## 2015-05-22 NOTE — Therapy (Signed)
[facility] [address] [place], [place], [address] Phone: [phone]   Fax:  [phone]  Speech Language Pathology Treatment  Patient Details  Name: Jeremiah Owens MRN: [id number] Date of Birth: 1930-07-29 No Data Recorded  Encounter Date: 05/22/2015      End of Session - 05/22/15 0939    Visit Number 22   Number of Visits 24   Date for SLP Re-Evaluation 06/01/15   SLP Start Time 0850   SLP Stop Time  0935   SLP Time Calculation (min) 45 min   Activity Tolerance Patient tolerated treatment well      Past Medical History  Diagnosis Date  . Compression fracture   . Allergy   . Anxiety   . Thyroid disease   . S/P radiation therapy 08/21/14-09/04/14    T4-6 25Gy/34f  . S/P radiation therapy 11/29/14-12/12/14    rt prox humerus/shoulder/scapula 25Gy/159f . Encounter for antineoplastic chemotherapy 01/31/2015  . Melanoma (HC[facility]  . Bone cancer (HCP[facility]    t_spine T-5  . Prostate cancer (HCWaggaman2002  . Skin cancer 1994    melanoma  right neck   . Multiple myeloma (HCChilo[place]  . Hearing loss   . [facility]    Past Surgical History  Procedure Laterality Date  . Hernia repair    . Posterior cervical fusion/foraminotomy N/A 06/17/2014    Procedure: Thoracic five laminectomy for epidural tumor resection Thoracic 4-7 posterior lateral arthrodesis, segmental pedicle screw fixation.;  Surgeon: KyAshok PallMD;  [facility];  Service: Neurosurgery;  Laterality: N/A;    There were no vitals filed for this visit.  Visit Diagnosis: Expressive aphasia      Subjective Assessment - 05/22/15 0851    Subjective "(My success) didn't change that much with -- the homework."               ADULT SLP TREATMENT - 05/22/15 0852    General Information   Behavior/Cognition Alert;Cooperative;Pleasant mood   Treatment Provided   Treatment provided Cognitive-Linquistic   Dysphagia Treatment   Other  treatment/comments denies any notable change in swallowing ability   Pain Assessment   Pain Assessment No/denies pain   Cognitive-Linquistic Treatment   Treatment focused on Apraxia   Skilled Treatment Extra time needed consistently and occasional mod questioning cues for simple to mod complex conversation. Pt noted to abandon utterances and omit difficult words instead of use compensations. In naming tasks/semantic relationship tasks today pt req'd mod A. SLP reminded pt of compensations.    Assessment / Recommendations / Plan   Plan Continue with current plan of care   Progression Toward Goals   Progression toward goals Not progressing toward goals (comment)  pt's progress is slowing - discharge imminent in next 2 week          SLP Education - 05/22/15 0938    Education provided Yes   Education Details using compensations instead of abandoning utterances/words   Person(s) Educated Patient   Methods Explanation   Comprehension Verbalized understanding;Verbal cues required          SLP Short Term Goals - 04/26/15 1055    SLP SHORT TERM GOAL #1   Title pt will produce sentence responses in mod complex tasks 80% and rare min A   Status Not Met   SLP SHORT TERM GOAL #2   Title pt will demo HEP for swallowing with rare min A   Status Achieved   SLP  SHORT TERM GOAL #3   Title pt will demo compensatory strategies for anomia with occasasional min A   Status Not Met   SLP SHORT TERM GOAL #4   Title Pt's signature/ basic demographic info will not show any signs of aphasia   Status Partially Met          SLP Long Term Goals - 05/22/15 3818    SLP LONG TERM GOAL #1   Title pt will demo 10 minutes simple to mod complex conversation fucntionally with occasional verbal cues   Baseline Continue goal upon renewal 04/30/15  to 06/03/15   Time 2   Period Weeks   Status Revised   SLP LONG TERM GOAL #2   Title pt will demo HEP for swallowing with modified independence   Baseline  Continue goal upon renewal 04/30/15 to 06/03/15   Time 2   Period Weeks   Status Deferred   SLP LONG TERM GOAL #3   Title pt will write checks adequately with rare min cues   Baseline Continue goal upon renewal 04/30/15 to 06/03/15   Status Achieved          Plan - 05/22/15 0939    Clinical Impression Statement Simple to mod complex conversation req'd mod SLP A today, occasionally. ST to focus primarily on compensations for next three visits.   Speech Therapy Frequency 2x / week   Duration 2 weeks   Treatment/Interventions Internal/external aids;Compensatory strategies;SLP instruction and feedback;Patient/family education;Functional tasks;Cueing hierarchy;Pharyngeal strengthening exercises;Oral motor exercises;Compensatory techniques;Diet toleration management by SLP   Potential to Achieve Goals Fair   Potential Considerations Severity of impairments        Problem List Patient Active Problem List   Diagnosis Date Noted  . Diarrhea 04/25/2015  . Cancer associated pain 04/25/2015  . Long term current use of anticoagulant therapy 04/25/2015  . Basal ganglia infarction ([place]) 02/12/2015  . Dizziness   . Expressive aphasia   . Past pointing   . Right leg weakness   . Acute ischemic stroke ([place]) 02/11/2015  . CVA (cerebral infarction) 02/11/2015  . Aspiration pneumonia ([place]) 02/11/2015  . Aphasia   . Difficulty speaking   . Speech difficult to understand   . Stroke ([place])   . Encounter for antineoplastic chemotherapy 01/31/2015  . Constipation 12/20/2014  . Rash 12/20/2014  . Cellulitis 12/06/2014  . Peripheral edema 12/06/2014  . Gait disorder 09/05/2014  . Multiple myeloma ([place])   . Paraplegia ([place]) 06/21/2014  . Postoperative anemia due to acute blood loss 06/21/2014  . 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 Spine metastasis ([place]) 06/17/2014  . Thoracic spine tumor 06/17/2014  . Metastatic bone cancer ([place]) 06/15/2014    [facility] , [place], [place]  05/22/2015,  9:43 AM  [facility] [address] [place], [place], [address] Phone: [phone]   Fax:  [phone]   Name: Jeremiah Owens MRN: [id number] Date of Birth: 1930/05/31

## 2015-05-22 NOTE — Patient Instructions (Signed)
Think about using compensations for  instead of eliminating/abandoning words.

## 2015-05-23 ENCOUNTER — Other Ambulatory Visit: Payer: Self-pay | Admitting: Medical Oncology

## 2015-05-23 ENCOUNTER — Encounter: Payer: Self-pay | Admitting: Internal Medicine

## 2015-05-23 ENCOUNTER — Ambulatory Visit (HOSPITAL_BASED_OUTPATIENT_CLINIC_OR_DEPARTMENT_OTHER): Payer: Medicare Other | Admitting: Internal Medicine

## 2015-05-23 ENCOUNTER — Ambulatory Visit (HOSPITAL_BASED_OUTPATIENT_CLINIC_OR_DEPARTMENT_OTHER): Payer: Medicare Other

## 2015-05-23 ENCOUNTER — Telehealth: Payer: Self-pay | Admitting: Internal Medicine

## 2015-05-23 ENCOUNTER — Other Ambulatory Visit (HOSPITAL_BASED_OUTPATIENT_CLINIC_OR_DEPARTMENT_OTHER): Payer: Medicare Other

## 2015-05-23 ENCOUNTER — Telehealth: Payer: Self-pay | Admitting: *Deleted

## 2015-05-23 VITALS — BP 125/65 | HR 72 | Temp 98.7°F | Resp 18 | Ht 69.0 in | Wt 156.1 lb

## 2015-05-23 DIAGNOSIS — C9000 Multiple myeloma not having achieved remission: Secondary | ICD-10-CM

## 2015-05-23 DIAGNOSIS — Z5112 Encounter for antineoplastic immunotherapy: Secondary | ICD-10-CM

## 2015-05-23 LAB — BASIC METABOLIC PANEL (CC13)
Anion Gap: 7 mEq/L (ref 3–11)
BUN: 16.1 mg/dL (ref 7.0–26.0)
CALCIUM: 8.8 mg/dL (ref 8.4–10.4)
CO2: 29 meq/L (ref 22–29)
CREATININE: 0.9 mg/dL (ref 0.7–1.3)
Chloride: 104 mEq/L (ref 98–109)
EGFR: 75 mL/min/{1.73_m2} — ABNORMAL LOW (ref >90)
Glucose: 89 mg/dl (ref 70–140)
Potassium: 4.1 mEq/L (ref 3.5–5.1)
SODIUM: 140 meq/L (ref 136–145)

## 2015-05-23 LAB — CBC WITH DIFFERENTIAL/PLATELET
BASO%: 0.7 % (ref 0.0–2.0)
BASOS ABS: 0 10*3/uL (ref 0.0–0.1)
EOS ABS: 0.4 10*3/uL (ref 0.0–0.5)
EOS%: 13.4 % — ABNORMAL HIGH (ref 0.0–7.0)
HCT: 36 % — ABNORMAL LOW (ref 38.4–49.9)
HGB: 12.1 g/dL — ABNORMAL LOW (ref 13.0–17.1)
LYMPH%: 17.2 % (ref 14.0–49.0)
MCH: 33.1 pg (ref 27.2–33.4)
MCHC: 33.7 g/dL (ref 32.0–36.0)
MCV: 98.1 fL — AB (ref 79.3–98.0)
MONO#: 0.4 10*3/uL (ref 0.1–0.9)
MONO%: 16.2 % — AB (ref 0.0–14.0)
NEUT%: 52.5 % (ref 39.0–75.0)
NEUTROS ABS: 1.4 10*3/uL — AB (ref 1.5–6.5)
PLATELETS: 83 10*3/uL — AB (ref 140–400)
RBC: 3.67 10*6/uL — AB (ref 4.20–5.82)
RDW: 16.2 % — ABNORMAL HIGH (ref 11.0–14.6)
WBC: 2.7 10*3/uL — AB (ref 4.0–10.3)
lymph#: 0.5 10*3/uL — ABNORMAL LOW (ref 0.9–3.3)

## 2015-05-23 MED ORDER — ONDANSETRON HCL 8 MG PO TABS
ORAL_TABLET | ORAL | Status: AC
  Filled 2015-05-23: qty 1

## 2015-05-23 MED ORDER — BORTEZOMIB CHEMO SQ INJECTION 3.5 MG (2.5MG/ML)
1.3000 mg/m2 | Freq: Once | INTRAMUSCULAR | Status: AC
  Administered 2015-05-23: 2.5 mg via SUBCUTANEOUS
  Filled 2015-05-23: qty 2.5

## 2015-05-23 MED ORDER — ONDANSETRON 8 MG PO TBDP
8.0000 mg | ORAL_TABLET | Freq: Once | ORAL | Status: DC
  Filled 2015-05-23: qty 1

## 2015-05-23 MED ORDER — ONDANSETRON HCL 8 MG PO TABS
8.0000 mg | ORAL_TABLET | Freq: Once | ORAL | Status: AC
  Administered 2015-05-23: 8 mg via ORAL

## 2015-05-23 NOTE — Telephone Encounter (Signed)
Chart open by mistake

## 2015-05-23 NOTE — Patient Instructions (Signed)
Cancer Center Discharge Instructions for Patients Receiving Chemotherapy  Today you received the following chemotherapy agents: Velcade  To help prevent nausea and vomiting after your treatment, we encourage you to take your nausea medication as prescribed by your physician.   If you develop nausea and vomiting that is not controlled by your nausea medication, call the clinic.   BELOW ARE SYMPTOMS THAT SHOULD BE REPORTED IMMEDIATELY:  *FEVER GREATER THAN 100.5 F  *CHILLS WITH OR WITHOUT FEVER  NAUSEA AND VOMITING THAT IS NOT CONTROLLED WITH YOUR NAUSEA MEDICATION  *UNUSUAL SHORTNESS OF BREATH  *UNUSUAL BRUISING OR BLEEDING  TENDERNESS IN MOUTH AND THROAT WITH OR WITHOUT PRESENCE OF ULCERS  *URINARY PROBLEMS  *BOWEL PROBLEMS  UNUSUAL RASH Items with * indicate a potential emergency and should be followed up as soon as possible.  Feel free to call the clinic you have any questions or concerns. The clinic phone number is [phone].  Please show the CHEMO ALERT CARD at check-in to the Emergency Department and triage nurse.   

## 2015-05-23 NOTE — Telephone Encounter (Signed)
Gave adn printed appt sched and avs fo rpt for OCT thru DEC °

## 2015-05-23 NOTE — Progress Notes (Signed)
Jeremiah Owens Telephone:(336) [phone]   Fax:(336) [address] [place] [address]  DIAGNOSIS: Multiple myeloma diagnosed in November 2015  PRIOR THERAPY:  1) Status post Thoracic five laminectomy for epidural tumor resection, Thoracic 4-7 posterior lateral arthrodesis,  segmental pedicle screw fixation T4-T7 Globus instrumentation under the care of Dr. Christella Noa on 06/18/2014. 2) Systemic chemotherapy with Velcade 1.3 MG/M2 subcutaneously weekly in addition to Decadron 40 mg by mouth on weekly basis. Status post 11 cycles. 3) palliative radiotherapy to the right proximal humerus, shoulder and scapula under the care of Dr. Lisbeth Renshaw completed on 12/12/2014.  CURRENT THERAPY:  1) Systemic chemotherapy with Velcade 1.3 MG/M2 subcutaneously weekly, Decadron 40 mg by mouth weekly in addition to Revlimid 25 MG by mouth daily for 21 days every 4 weeks, status post 9 cycles. He is expected to start cycle #10 week. 2) Zometa 4 mg IV every month. First dose 11/01/2014.  INTERVAL HISTORY: Jeremiah Owens 81 y.o. male returns to the clinic today for follow-up visit accompanied by his wife. The patient is currently on treatment with weekly subcutaneous Velcade, Revlimid and Decadron status post 9 cycle and tolerating the treatment fairly well. He denied having any significant nausea or vomiting, no fever or chills. He denied having any significant weight loss or night sweats. He denied having any significant chest pain, shortness of breath, cough or hemoptysis. He is here today for evaluation and repeat blood work.  MEDICAL HISTORY: Past Medical History  Diagnosis Date  . Compression fracture   . Allergy   . Anxiety   . Thyroid disease   . S/P radiation therapy 08/21/14-09/04/14    T4-6 25Gy/20f  . S/P radiation therapy 11/29/14-12/12/14    rt prox humerus/shoulder/scapula 25Gy/142f . Encounter for antineoplastic  chemotherapy 01/31/2015  . Melanoma ([facility]  . Bone cancer ([facility]    t_spine T-5  . Prostate cancer ([facility]  . Skin cancer 1994    melanoma  right neck   . Multiple myeloma ([facility]  . Hearing loss   . [facility]    ALLERGIES:  is allergic to iodine.  MEDICATIONS:  Current Outpatient Prescriptions  Medication Sig Dispense Refill  . acyclovir (ZOVIRAX) 400 MG tablet TAKE 1 TABLET BY MOUTH TWICE A DAY 60 tablet 2  . aspirin 81 MG EC tablet Take 81 mg by mouth daily.  0  . Calcium Carbonate-Vitamin D (CALTRATE 600+D PO) Take 600 mg by mouth 2 (two) times daily.    . cholecalciferol (VITAMIN D) 1000 UNITS tablet Take 1,000 Units by mouth at bedtime.     . Marland Kitchenexamethasone (DECADRON) 4 MG tablet TAKE 10 TABS EVERY WEEK, START WITH CHEMO 40 tablet 4  . furosemide (LASIX) 20 MG tablet Take 20 mg by mouth 2 (two) times daily.    . Marland KitchenLOR-CON 10 10 MEQ tablet Take 10 mEq by mouth daily.    . Marland Kitchenenalidomide (REVLIMID) 25 MG capsule Take 1 capsule (25 mg total) by mouth daily. Take one capsule ( 25 mg) by mouth for 21 days every 4 weeks-05/10/15 Authorization number=  [id number] male 21 capsule 0  . levothyroxine (SYNTHROID, LEVOTHROID) 50 MCG tablet Take 50 mcg by mouth daily.  1  . omeprazole (PRILOSEC) 40 MG capsule Take 40 mg by mouth daily.    . ranitidine (ZANTAC) 150 MG tablet Take 150 mg by mouth daily as needed for heartburn.  11  . sennosides-docusate sodium (SENOKOT-S) 8.6-50 MG tablet Take 1-2 tablets by mouth 2 (two) times daily as needed for constipation. Takes 1-2 tabs daily    . tamsulosin (FLOMAX) 0.4 MG CAPS capsule Take 1 capsule (0.4 mg total) by mouth at bedtime. 30 capsule 3  . warfarin (COUMADIN) 2 MG tablet Take 1 tablet (2 mg total) by mouth daily. 90 tablet 4  . HYDROcodone-acetaminophen (NORCO) 5-325 MG per tablet Take 1 tablet by mouth every 6 (six) hours as needed for moderate pain. (Patient not taking: Reported on 05/23/2015) 60 tablet 0  . methocarbamol (ROBAXIN)  500 MG tablet TAKE 1 TABLET (500 MG TOTAL) BY MOUTH EVERY 6 (SIX) HOURS AS NEEDED FOR MUSCLE SPASMS. (Patient not taking: Reported on 05/23/2015) 180 tablet 4  . Multiple Vitamins-Minerals (CENTRUM SILVER PO) Take 1 tablet by mouth daily.    . prochlorperazine (COMPAZINE) 10 MG tablet Take 10 mg by mouth daily.  0   No current facility-administered medications for this visit.    SURGICAL HISTORY:  Past Surgical History  Procedure Laterality Date  . Hernia repair    . Posterior cervical fusion/foraminotomy N/A 06/17/2014    Procedure: Thoracic five laminectomy for epidural tumor resection Thoracic 4-7 posterior lateral arthrodesis, segmental pedicle screw fixation.;  Surgeon: Ashok Pall, MD;  Location: [facility];  Service: Neurosurgery;  Laterality: N/A;    REVIEW OF SYSTEMS:  A comprehensive review of systems was negative except for: Constitutional: positive for fatigue   PHYSICAL EXAMINATION: General appearance: alert, cooperative, fatigued and no distress Head: Normocephalic, without obvious abnormality, atraumatic Neck: no adenopathy, no JVD, supple, symmetrical, trachea midline and thyroid not enlarged, symmetric, no tenderness/mass/nodules Lymph nodes: Cervical, supraclavicular, and axillary nodes normal. Resp: clear to auscultation bilaterally Back: symmetric, no curvature. ROM normal. No CVA tenderness. Cardio: regular rate and rhythm, S1, S2 normal, no murmur, click, rub or gallop GI: soft, non-tender; bowel sounds normal; no masses,  no organomegaly Extremities: edema 2+ edema bilaterally with erythema on the legs Neurologic: Alert and oriented X 3, normal strength and tone. Normal symmetric reflexes. Normal coordination and gait Motor: grossly normal Weakness in the left foot  ECOG PERFORMANCE STATUS: 1 - Symptomatic but completely ambulatory  Blood pressure 125/65, pulse 72, temperature 98.7 F (37.1 C), temperature source Oral, resp. rate 18, height '5\' 9"'  (1.753 m),  weight 156 lb 1.6 oz (70.806 kg), SpO2 100 %.  LABORATORY DATA: Lab Results  Component Value Date   WBC 2.7* 05/23/2015   HGB 12.1* 05/23/2015   HCT 36.0* 05/23/2015   MCV 98.1* 05/23/2015   PLT 83* 05/23/2015      Chemistry      Component Value Date/Time   NA 140 05/23/2015 1147   NA 134* 02/21/2015 0626   K 4.1 05/23/2015 1147   K 4.1 02/21/2015 0626   CL 101 02/21/2015 0626   CO2 29 05/23/2015 1147   CO2 26 02/21/2015 0626   BUN 16.1 05/23/2015 1147   BUN 12 02/21/2015 0626   CREATININE 0.9 05/23/2015 1147   CREATININE 0.84 02/21/2015 0626      Component Value Date/Time   CALCIUM 8.8 05/23/2015 1147   CALCIUM 7.9* 02/21/2015 0626   ALKPHOS 54 05/02/2015 0939   ALKPHOS 48 02/21/2015 0626   AST 18 05/02/2015 0939   AST 17 02/21/2015 0626   ALT 23 05/02/2015 0939   ALT 25 02/21/2015 0626   BILITOT 0.74 05/02/2015 0939   BILITOT 1.1 02/21/2015 1157  RADIOGRAPHIC STUDIES: No results found.  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 He completed treatment with subcutaneous Velcade and Decadron status post 11 weekly doses. This was discontinued secondary to disease progression. The patient was started on treatment with weekly subcutaneous Velcade, Revlimid for 21 days every 4 weeks in addition to Decadron 40 mg weekly status post 9 cycle and he is tolerating it fairly well.  I recommended for the patient to continue his treatment today as a scheduled. He would come back for follow-up visit in 4 weeks for reevaluation He will proceed with the Zometa infusion as scheduled. He was advised to call immediately if he has any concerning symptoms in the interval. The patient voices understanding of current disease status and treatment options and is in agreement with the current care plan.  All  questions were answered. The patient knows to call the clinic with any problems, questions or concerns. We can certainly see the patient much sooner if necessary.  Disclaimer: This note was dictated with voice recognition software. Similar sounding words can inadvertently be transcribed and may not be corrected upon review.

## 2015-05-23 NOTE — Progress Notes (Signed)
Per  Julien Nordmann, It is okay to treat pt today with chemo and today's labs.

## 2015-05-24 ENCOUNTER — Ambulatory Visit: Payer: Medicare Other

## 2015-05-24 DIAGNOSIS — R269 Unspecified abnormalities of gait and mobility: Secondary | ICD-10-CM | POA: Diagnosis not present

## 2015-05-24 DIAGNOSIS — M6281 Muscle weakness (generalized): Secondary | ICD-10-CM | POA: Diagnosis not present

## 2015-05-24 DIAGNOSIS — R2991 Unspecified symptoms and signs involving the musculoskeletal system: Secondary | ICD-10-CM | POA: Diagnosis not present

## 2015-05-24 DIAGNOSIS — R1313 Dysphagia, pharyngeal phase: Secondary | ICD-10-CM

## 2015-05-24 DIAGNOSIS — R4701 Aphasia: Secondary | ICD-10-CM | POA: Diagnosis not present

## 2015-05-24 DIAGNOSIS — R2681 Unsteadiness on feet: Secondary | ICD-10-CM | POA: Diagnosis not present

## 2015-05-24 NOTE — Therapy (Signed)
[facility] [address] [place], [place], [address] Phone: [phone]   Fax:  [phone]  Speech Language Pathology Treatment  Patient Details  Name: Jeremiah Owens MRN: [id number] Date of Birth: March 16, 1930 No Data Recorded  Encounter Date: 05/24/2015      End of Session - 05/24/15 1007    Visit Number 23   Number of Visits 24   Date for SLP Re-Evaluation 06/01/15   SLP Start Time 0848   SLP Stop Time  0931   SLP Time Calculation (min) 43 min   Activity Tolerance Patient tolerated treatment well      Past Medical History  Diagnosis Date  . Compression fracture   . Allergy   . Anxiety   . Thyroid disease   . S/P radiation therapy 08/21/14-09/04/14    T4-6 25Gy/63f  . S/P radiation therapy 11/29/14-12/12/14    rt prox humerus/shoulder/scapula 25Gy/167f . Encounter for antineoplastic chemotherapy 01/31/2015  . Melanoma ([facility]  . Bone cancer ([facility]    t_spine T-5  . Prostate cancer (HC[facility]2002  . Skin cancer 1994    melanoma  right neck   . Multiple myeloma ([facility]CO[facility]  . Hearing loss   . [facility]    Past Surgical History  Procedure Laterality Date  . Hernia repair    . Posterior cervical fusion/foraminotomy N/A 06/17/2014    Procedure: Thoracic five laminectomy for epidural tumor resection Thoracic 4-7 posterior lateral arthrodesis, segmental pedicle screw fixation.;  Surgeon: KyAshok PallMD;  [facility];  Service: Neurosurgery;  Laterality: N/A;    There were no vitals filed for this visit.  Visit Diagnosis: Expressive aphasia  Pharyngeal dysphagia      Subjective Assessment - 05/24/15 1006    Subjective Yesterday it seemed like  - [facility] (pt explaining that he spent a long time at treatment yesterday due to them not taking/losing vials of blood). Pt modified barium swallow on 05-31-15. Pt to bring apple slices next session to attempt with  SLP observation.               ADULT SLP TREATMENT - 05/24/15 0910    General Information   Behavior/Cognition Alert;Cooperative;Pleasant mood   Treatment Provided   Treatment provided Cognitive-Linquistic   Pain Assessment   Pain Assessment No/denies pain   Cognitive-Linquistic Treatment   Treatment focused on Aphasia   Skilled Treatment Reviewed copmensatory measures with pt for aphasia (slow rate, description, synonym, and circumlocution) with focus on slow rate in structured multisentence tasks. Pt remained with slow rate for first sentence in his response then sped up and message became more disjointed as response continued. With mod cues usually, pt able to generate >1 sentence with slowed rate.    Assessment / Recommendations / Plan   Plan Continue with current plan of care   Progression Toward Goals   Progression toward goals Progressing toward goals          SLP Education - 05/24/15 1007    Education provided Yes   Education Details compensations for aphasia, key in most on slow rate   Person(s) Educated Patient   Methods Explanation;Demonstration   Comprehension Verbalized understanding;Returned demonstration;Verbal cues required;Need further instruction          SLP Short Term Goals - 04/26/15 1055    SLP SHORT TERM GOAL #1   Title pt will produce sentence responses in  mod complex tasks 80% and rare min A   Status Not Met   SLP SHORT TERM GOAL #2   Title pt will demo HEP for swallowing with rare min A   Status Achieved   SLP SHORT TERM GOAL #3   Title pt will demo compensatory strategies for anomia with occasasional min A   Status Not Met   SLP SHORT TERM GOAL #4   Title Pt's signature/ basic demographic info will not show any signs of aphasia   Status Partially Met          SLP Long Term Goals - 05/24/15 1010    SLP LONG TERM GOAL #1   Title pt will demo 10 minutes simple to mod complex conversation fucntionally with occasional verbal cues    Baseline Continue goal upon renewal 04/30/15  to 06/03/15   Time 2   Period Weeks   Status Revised   SLP LONG TERM GOAL #2   Title pt will demo HEP for swallowing with modified independence   Baseline Continue goal upon renewal 04/30/15 to 06/03/15   Time 2   Period Weeks   Status Deferred   SLP LONG TERM GOAL #3   Title pt will write checks adequately with rare min cues   Baseline Continue goal upon renewal 04/30/15 to 06/03/15   Status Achieved          Plan - 05/24/15 1008    Clinical Impression Statement Pt req'd SLP mod cues usually to slow his rate > one sentence. He will bring apple slices next session to try with SLP observation. Skilled ST needed to hone pt's use of compensations for aphasia, as progress has slowed with structured therapy targeting spontaneous speech   Speech Therapy Frequency 2x / week   Duration 2 weeks   Treatment/Interventions Internal/external aids;Compensatory strategies;SLP instruction and feedback;Patient/family education;Functional tasks;Cueing hierarchy;Pharyngeal strengthening exercises;Oral motor exercises;Compensatory techniques;Diet toleration management by SLP   Potential to Achieve Goals Fair   Potential Considerations Severity of impairments        Problem List Patient Active Problem List   Diagnosis Date Noted  . Diarrhea 04/25/2015  . Cancer associated pain 04/25/2015  . Long term current use of anticoagulant therapy 04/25/2015  . Basal ganglia infarction ([place]) 02/12/2015  . Dizziness   . Expressive aphasia   . Past pointing   . Right leg weakness   . Acute ischemic stroke ([place]) 02/11/2015  . CVA (cerebral infarction) 02/11/2015  . Aspiration pneumonia ([place]) 02/11/2015  . Aphasia   . Difficulty speaking   . Speech difficult to understand   . Stroke ([place])   . Encounter for antineoplastic chemotherapy 01/31/2015  . Constipation 12/20/2014  . Rash 12/20/2014  . Cellulitis 12/06/2014  . Peripheral edema 12/06/2014  . Gait  disorder 09/05/2014  . Multiple myeloma ([place])   . Paraplegia ([place]) 06/21/2014  . Postoperative anemia due to acute blood loss 06/21/2014  . 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 Spine metastasis ([place]) 06/17/2014  . Thoracic spine tumor 06/17/2014  . Metastatic bone cancer ([place]) 06/15/2014    [facility] , [place], [place]   05/24/2015, 10:11 AM  [facility] [address] [place], [place], [address] Phone: [phone]   Fax:  [phone]   Name: Jeremiah Owens MRN: [id number] Date of Birth: 12/15/1929

## 2015-05-25 ENCOUNTER — Other Ambulatory Visit: Payer: Self-pay | Admitting: Physical Medicine & Rehabilitation

## 2015-05-25 DIAGNOSIS — R1310 Dysphagia, unspecified: Secondary | ICD-10-CM

## 2015-05-29 ENCOUNTER — Other Ambulatory Visit (HOSPITAL_COMMUNITY): Payer: Self-pay | Admitting: Physical Medicine & Rehabilitation

## 2015-05-29 ENCOUNTER — Ambulatory Visit: Payer: Medicare Other

## 2015-05-29 DIAGNOSIS — M6281 Muscle weakness (generalized): Secondary | ICD-10-CM | POA: Diagnosis not present

## 2015-05-29 DIAGNOSIS — R2991 Unspecified symptoms and signs involving the musculoskeletal system: Secondary | ICD-10-CM | POA: Diagnosis not present

## 2015-05-29 DIAGNOSIS — R1310 Dysphagia, unspecified: Secondary | ICD-10-CM

## 2015-05-29 DIAGNOSIS — R1313 Dysphagia, pharyngeal phase: Secondary | ICD-10-CM | POA: Diagnosis not present

## 2015-05-29 DIAGNOSIS — R4701 Aphasia: Secondary | ICD-10-CM | POA: Diagnosis not present

## 2015-05-29 DIAGNOSIS — R2681 Unsteadiness on feet: Secondary | ICD-10-CM | POA: Diagnosis not present

## 2015-05-29 DIAGNOSIS — R269 Unspecified abnormalities of gait and mobility: Secondary | ICD-10-CM | POA: Diagnosis not present

## 2015-05-29 NOTE — Therapy (Signed)
[facility] [address] [place], [place], [address] Phone: [phone]   Fax:  [phone]  Speech Language Pathology Treatment  Patient Details  Name: Jeremiah Owens MRN: [id number] Date of Birth: 05/09/1930 No Data Recorded  Encounter Date: 05/29/2015      End of Session - 05/29/15 9735    Visit Number 24   Number of Visits 24   Date for SLP Re-Evaluation 06/01/15   SLP Start Time 0804   SLP Stop Time  0846   SLP Time Calculation (min) 42 min   Activity Tolerance Patient tolerated treatment well      Past Medical History  Diagnosis Date  . Compression fracture   . Allergy   . Anxiety   . Thyroid disease   . S/P radiation therapy 08/21/14-09/04/14    T4-6 25Gy/40f  . S/P radiation therapy 11/29/14-12/12/14    rt prox humerus/shoulder/scapula 25Gy/158f . Encounter for antineoplastic chemotherapy 01/31/2015  . Melanoma (HCRav[facility]  . Bone cancer (HCV[facility]    t_spine T-5  . Prostate cancer (HCBlythewood2002  . Skin cancer 1994    melanoma  right neck   . Multiple myeloma (HCHeathc[facility]  . Hearing loss   . [facility]    Past Surgical History  Procedure Laterality Date  . Hernia repair    . Posterior cervical fusion/foraminotomy N/A 06/17/2014    Procedure: Thoracic five laminectomy for epidural tumor resection Thoracic 4-7 posterior lateral arthrodesis, segmental pedicle screw fixation.;  Surgeon: KyAshok PallMD;  [facility];  Service: Neurosurgery;  Laterality: N/A;    There were no vitals filed for this visit.  Visit Diagnosis: Expressive aphasia      Subjective Assessment - 05/29/15 0815    Subjective "This (homework) was a good task."   Currently in Pain? No/denies               ADULT SLP TREATMENT - 05/29/15 0833    General Information   Behavior/Cognition Alert;Cooperative;Pleasant mood   Treatment Provided   Treatment provided Cognitive-Linquistic   Dysphagia Treatment   Other  treatment/comments denies difficulty with swallowing during meals   Cognitive-Linquistic Treatment   Treatment focused on Aphasia   Skilled Treatment SLP briefly reviewed compensatory strategies with pt and provided examples of each. SLP reviewed homework with pt for approx 15 minutes. Very little change in pt ability to generate semantic relationships between objects/ideas as in the previous approx 2 weeks. Pt req'd usual mod-max A for semantic tasks (synonyms, opposites, defining words, etc). Simple conversation remained mostly functional, however simple/mod complex conversation req'd mod cues occasionally for message conveyance/fluidity.   Assessment / Recommendations / Plan   Plan Continue with current plan of care   Progression Toward Goals   Progression toward goals Not progressing toward goals (comment)          SLP Education - 05/29/15 [phone]  Education provided Yes   Education Details aphasia compensations (reviewed)   Person(s) Educated Patient   Methods Explanation;Demonstration   Comprehension Verbalized understanding;Verbal cues required;Need further instruction          SLP Short Term Goals - 04/26/15 1055    SLP SHORT TERM GOAL #1   Title pt will produce sentence responses in mod complex tasks 80% and rare min A   Status Not Met   SLP SHORT TERM GOAL #2   Title pt will demo HEP for swallowing with rare min A   Status Achieved  SLP SHORT TERM GOAL #3   Title pt will demo compensatory strategies for anomia with occasasional min A   Status Not Met   SLP SHORT TERM GOAL #4   Title Pt's signature/ basic demographic info will not show any signs of aphasia   Status Partially Met          SLP Long Term Goals - 05/29/15 1610    SLP LONG TERM GOAL #1   Title pt will demo 10 minutes simple to mod complex conversation fucntionally with occasional verbal cues   Baseline Continue goal upon renewal 04/30/15  to 06/03/15   Time 1   Period Weeks   Status Revised   SLP  LONG TERM GOAL #2   Title pt will demo HEP for swallowing with modified independence   Baseline Continue goal upon renewal 04/30/15 to 06/03/15   Time --   Period --   Status Deferred   SLP LONG TERM GOAL #3   Title pt will write checks adequately with rare min cues   Baseline Continue goal upon renewal 04/30/15 to 06/03/15   Status Achieved          Plan - 05/29/15 9604    Clinical Impression Statement Pt will be discharged next session due to max potential reached at this time with expressive language. Pt aware and agrees.   Speech Therapy Frequency 2x / week   Duration 1 week   Treatment/Interventions Internal/external aids;Compensatory strategies;SLP instruction and feedback;Patient/family education;Functional tasks;Cueing hierarchy;Pharyngeal strengthening exercises;Oral motor exercises;Compensatory techniques;Diet toleration management by SLP   Potential to Achieve Goals Fair   Potential Considerations Severity of impairments        Problem List Patient Active Problem List   Diagnosis Date Noted  . Diarrhea 04/25/2015  . Cancer associated pain 04/25/2015  . Long term current use of anticoagulant therapy 04/25/2015  . Basal ganglia infarction ([place]) 02/12/2015  . Dizziness   . Expressive aphasia   . Past pointing   . Right leg weakness   . Acute ischemic stroke ([place]) 02/11/2015  . CVA (cerebral infarction) 02/11/2015  . Aspiration pneumonia ([place]) 02/11/2015  . Aphasia   . Difficulty speaking   . Speech difficult to understand   . Stroke ([place])   . Encounter for antineoplastic chemotherapy 01/31/2015  . Constipation 12/20/2014  . Rash 12/20/2014  . Cellulitis 12/06/2014  . Peripheral edema 12/06/2014  . Gait disorder 09/05/2014  . Multiple myeloma ([place])   . Paraplegia ([place]) 06/21/2014  . Postoperative anemia due to acute blood loss 06/21/2014  . 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 Spine metastasis ([place]) 06/17/2014  . Thoracic spine tumor 06/17/2014  .  Metastatic bone cancer ([place]) 06/15/2014    [facility] , [place], [place]  05/29/2015, 9:27 AM  [facility] [address] [place], [place], [address] Phone: [phone]   Fax:  [phone]   Name: Rodricus Candelaria MRN: [id number] Date of Birth: Feb 19, 1930

## 2015-05-30 ENCOUNTER — Other Ambulatory Visit: Payer: Self-pay | Admitting: *Deleted

## 2015-05-30 ENCOUNTER — Other Ambulatory Visit (HOSPITAL_BASED_OUTPATIENT_CLINIC_OR_DEPARTMENT_OTHER): Payer: Medicare Other

## 2015-05-30 ENCOUNTER — Ambulatory Visit (HOSPITAL_BASED_OUTPATIENT_CLINIC_OR_DEPARTMENT_OTHER): Payer: Medicare Other

## 2015-05-30 VITALS — BP 120/53 | HR 71 | Temp 97.3°F | Resp 20

## 2015-05-30 DIAGNOSIS — C9000 Multiple myeloma not having achieved remission: Secondary | ICD-10-CM

## 2015-05-30 DIAGNOSIS — Z5112 Encounter for antineoplastic immunotherapy: Secondary | ICD-10-CM | POA: Diagnosis present

## 2015-05-30 LAB — CBC WITH DIFFERENTIAL/PLATELET
BASO%: 0.7 % (ref 0.0–2.0)
Basophils Absolute: 0 10*3/uL (ref 0.0–0.1)
EOS%: 6.3 % (ref 0.0–7.0)
Eosinophils Absolute: 0.2 10*3/uL (ref 0.0–0.5)
HCT: 35.4 % — ABNORMAL LOW (ref 38.4–49.9)
HEMOGLOBIN: 12.1 g/dL — AB (ref 13.0–17.1)
LYMPH#: 0.5 10*3/uL — AB (ref 0.9–3.3)
LYMPH%: 16 % (ref 14.0–49.0)
MCH: 33.5 pg — ABNORMAL HIGH (ref 27.2–33.4)
MCHC: 34.2 g/dL (ref 32.0–36.0)
MCV: 98.1 fL — ABNORMAL HIGH (ref 79.3–98.0)
MONO#: 0.3 10*3/uL (ref 0.1–0.9)
MONO%: 10.1 % (ref 0.0–14.0)
NEUT%: 66.9 % (ref 39.0–75.0)
NEUTROS ABS: 1.9 10*3/uL (ref 1.5–6.5)
Platelets: 103 10*3/uL — ABNORMAL LOW (ref 140–400)
RBC: 3.61 10*6/uL — AB (ref 4.20–5.82)
RDW: 15.6 % — ABNORMAL HIGH (ref 11.0–14.6)
WBC: 2.9 10*3/uL — AB (ref 4.0–10.3)

## 2015-05-30 LAB — BASIC METABOLIC PANEL (CC13)
ANION GAP: 5 meq/L (ref 3–11)
BUN: 16.6 mg/dL (ref 7.0–26.0)
CALCIUM: 8.8 mg/dL (ref 8.4–10.4)
CO2: 30 mEq/L — ABNORMAL HIGH (ref 22–29)
Chloride: 106 mEq/L (ref 98–109)
Creatinine: 0.9 mg/dL (ref 0.7–1.3)
EGFR: 78 mL/min/{1.73_m2} — AB (ref >90)
Glucose: 93 mg/dl (ref 70–140)
POTASSIUM: 4.3 meq/L (ref 3.5–5.1)
SODIUM: 141 meq/L (ref 136–145)

## 2015-05-30 MED ORDER — BORTEZOMIB CHEMO SQ INJECTION 3.5 MG (2.5MG/ML)
1.3000 mg/m2 | Freq: Once | INTRAMUSCULAR | Status: AC
  Administered 2015-05-30: 2.5 mg via SUBCUTANEOUS
  Filled 2015-05-30: qty 2.5

## 2015-05-30 MED ORDER — ONDANSETRON 8 MG PO TBDP
8.0000 mg | ORAL_TABLET | Freq: Once | ORAL | Status: AC
  Administered 2015-05-30: 8 mg via ORAL
  Filled 2015-05-30: qty 1

## 2015-05-30 NOTE — Patient Instructions (Signed)
[facility] Discharge Instructions for Patients Receiving Chemotherapy  Today you received the following chemotherapy agents: Velcade  To help prevent nausea and vomiting after your treatment, we encourage you to take your nausea medication as prescribed by your physical.   If you develop nausea and vomiting that is not controlled by your nausea medication, call the clinic.   BELOW ARE SYMPTOMS THAT SHOULD BE REPORTED IMMEDIATELY:  *FEVER GREATER THAN 100.5 F  *CHILLS WITH OR WITHOUT FEVER  NAUSEA AND VOMITING THAT IS NOT CONTROLLED WITH YOUR NAUSEA MEDICATION  *UNUSUAL SHORTNESS OF BREATH  *UNUSUAL BRUISING OR BLEEDING  TENDERNESS IN MOUTH AND THROAT WITH OR WITHOUT PRESENCE OF ULCERS  *URINARY PROBLEMS  *BOWEL PROBLEMS  UNUSUAL RASH Items with * indicate a potential emergency and should be followed up as soon as possible.  Feel free to call the clinic you have any questions or concerns. The clinic phone number is [phone].  Please show the [place] at check-in to the Emergency Department and triage nurse.

## 2015-05-31 ENCOUNTER — Ambulatory Visit (HOSPITAL_COMMUNITY)
Admission: RE | Admit: 2015-05-31 | Discharge: 2015-05-31 | Disposition: A | Discharge status: Home / Self Care | Payer: Medicare Other | Source: Ambulatory Visit | Attending: Physical Medicine & Rehabilitation | Admitting: Physical Medicine & Rehabilitation

## 2015-05-31 ENCOUNTER — Ambulatory Visit (HOSPITAL_COMMUNITY): Admission: RE | Admit: 2015-05-31 | Payer: Medicare Other | Source: Ambulatory Visit

## 2015-05-31 ENCOUNTER — Ambulatory Visit: Payer: Medicare Other

## 2015-05-31 DIAGNOSIS — T17320A Food in larynx causing asphyxiation, initial encounter: Secondary | ICD-10-CM | POA: Diagnosis not present

## 2015-05-31 DIAGNOSIS — Z8673 Personal history of transient ischemic attack (TIA), and cerebral infarction without residual deficits: Secondary | ICD-10-CM | POA: Insufficient documentation

## 2015-05-31 DIAGNOSIS — R1313 Dysphagia, pharyngeal phase: Secondary | ICD-10-CM | POA: Diagnosis not present

## 2015-05-31 DIAGNOSIS — R4701 Aphasia: Secondary | ICD-10-CM | POA: Diagnosis not present

## 2015-05-31 DIAGNOSIS — R2681 Unsteadiness on feet: Secondary | ICD-10-CM | POA: Diagnosis not present

## 2015-05-31 DIAGNOSIS — R1310 Dysphagia, unspecified: Secondary | ICD-10-CM

## 2015-05-31 DIAGNOSIS — R269 Unspecified abnormalities of gait and mobility: Secondary | ICD-10-CM | POA: Diagnosis not present

## 2015-05-31 DIAGNOSIS — R2991 Unspecified symptoms and signs involving the musculoskeletal system: Secondary | ICD-10-CM | POA: Diagnosis not present

## 2015-05-31 DIAGNOSIS — M6281 Muscle weakness (generalized): Secondary | ICD-10-CM | POA: Diagnosis not present

## 2015-05-31 NOTE — Therapy (Signed)
[facility] [address] [place], [place], [address] Phone: [phone]   Fax:  [phone]  Speech Language Pathology Treatment  Patient Details  Name: Jeremiah Owens MRN: [id number] Date of Birth: [birth year] No Data Recorded  Encounter Date: 05/31/2015      End of Session - 05/31/15 1449    Visit Number 25   Number of Visits 24   Date for SLP Re-Evaluation 06/01/15   SLP Start Time 1404   SLP Stop Time  1446   SLP Time Calculation (min) 42 min   Activity Tolerance Patient tolerated treatment well      Past Medical History  Diagnosis Date  . Compression fracture   . Allergy   . Anxiety   . Thyroid disease   . S/P radiation therapy 08/21/14-09/04/14    T4-6 25Gy/2f  . S/P radiation therapy 11/29/14-12/12/14    rt prox humerus/shoulder/scapula 25Gy/178f . Encounter for antineoplastic chemotherapy 01/31/2015  . [facility] ([facility]  . Bone cancer ([facility]    t_spine T-5  . Prostate cancer ([facility]  . Skin cancer 1994    melanoma  right neck   . Multiple myeloma ([facility]  . Hearing loss   . [facility]    Past Surgical History  Procedure Laterality Date  . Hernia repair    . Posterior cervical fusion/foraminotomy N/A 06/17/2014    Procedure: Thoracic five laminectomy for epidural tumor resection Thoracic 4-7 posterior lateral arthrodesis, segmental pedicle screw fixation.;  Surgeon: KyAshok PallMD;  [facility];  Service: Neurosurgery;  Laterality: N/A;    There were no vitals filed for this visit.  Visit Diagnosis: Expressive aphasia  Pharyngeal dysphagia      Subjective Assessment - 05/31/15 1417    Subjective Pt with modified this AM. Still needs to perform head turn to rt with liquids, extra swallows in order to clear residue from valleculae and lateral channel/s.   Currently in Pain? No/denies               ADULT SLP TREATMENT - 05/31/15 1419    General Information    Behavior/Cognition Alert;Cooperative;Pleasant mood   Treatment Provided   Treatment provided Cognitive-Linquistic;Dysphagia   Dysphagia Treatment   Other treatment/comments reviewed results of modified with pt and provided two more exercises (modified shaker - towel hold/press). SLP learned from pt that he was not doing all of HEP routinely, and average between 1-2x/day.   Cognitive-Linquistic Treatment   Treatment focused on Aphasia   Skilled Treatment SLP encouraged pt to engage more in social situations (e.g., [employer], etc). SLP educated pt re: external reminder for slowed rate. In word generation tasks pt req'd mod A occasionally Suggested to pt to make copies of tasks and put date he completed written tasks on the top so that in 3-4 weeks he can come back to it to see if there has been progress.   Assessment / Recommendations / Plan   Plan Discharge SLP treatment due to (comment)  max potential met   Progression Toward Goals   Progression toward goals --  discharge at this time - see goal update          SLP Education - 05/31/15 1449    Education provided Yes   Education Details home tasks, compensations   Person(s) Educated Patient   Methods Explanation   Comprehension Verbalized understanding;Verbal cues required;Returned demonstration          SLP Short Term Goals -  04/26/15 [place] #1   Title pt will produce sentence responses in mod complex tasks 80% and rare min A   Status Not Met   SLP SHORT TERM GOAL #2   Title pt will demo HEP for swallowing with rare min A   Status Achieved   SLP SHORT TERM GOAL #3   Title pt will demo compensatory strategies for anomia with occasasional min A   Status Not Met   SLP SHORT TERM GOAL #4   Title Pt's signature/ basic demographic info will not show any signs of aphasia   Status Partially Met          SLP Long Term Goals - 06-Jun-2015 1451    SLP LONG TERM GOAL #1   Title pt will demo 10 minutes simple  to mod complex conversation fucntionally with occasional verbal cues   Baseline Continue goal upon renewal 04/30/15  to 06/03/15   Time 1   Period Weeks   Status Not Met   SLP LONG TERM GOAL #2   Title pt will demo HEP for swallowing with modified independence   Baseline Continue goal upon renewal 04/30/15 to 06/03/15   Status Deferred   SLP LONG TERM GOAL #3   Title pt will write checks adequately with rare min cues   Baseline Continue goal upon renewal 04/30/15 to 06/03/15   Status Achieved          Plan - 06/06/2015 1450    Clinical Impression Statement See d/c summary.   Treatment/Interventions Internal/external aids;Compensatory strategies;SLP instruction and feedback;Patient/family education;Functional tasks;Cueing hierarchy;Pharyngeal strengthening exercises;Oral motor exercises;Compensatory techniques;Diet toleration management by SLP   Potential to Achieve Goals Fair   Potential Considerations Severity of impairments   SLP Home Exercise Plan added two exercises provided to pt today   Consulted and Agree with Plan of Care Patient          G-Codes - 06-06-15 1451    Functional Limitations Spoken language expressive   Spoken Language Expression Goal Status [phone]) At least 20 percent but less than 40 percent impaired, limited or restricted   Spoken Language Expression Discharge Status [phone]) At least 20 percent but less than 40 percent impaired, limited or restricted      SPEECH THERAPY RENEWAL/DISCHARGE SUMMARY  Visits from Start of Care: 25  Current functional level related to goals / functional outcomes: Please see short/long term goal summary above. Pt had one goal (LTG#1) enforced for today and goal was not met. He can speak in simple conversation functionally however mod complex conversation is more challenging for pt and is less-functional.  Writing continues to be an area in which pt has difficulty as well. He has maximized his rehab potential at this point and  agrees he is capable of continuing with therapy tasks at home. Please see latest modified barium swallow exam (MBSS) dated today for update on swallowing ability. Pt needs to maintain head turn to rt and double swallow in order to minimize risk of aspiration. Pt has copmleted HEP for dysphagia however not as prescribed (partial HEP was completed, between 1-2 times per day instead of full HEP at 2 times per day). SLP provided two more exercises based upon today's findings and encouraged pt to complete as prescribed with follow up MBSS request in approx 6 weeks.   Remaining deficits: Dysphagia, aphasia   Education / Equipment: HEP for dysphagia, compensations for aphasia  Plan: Patient agrees to discharge.  Patient goals were partially  met. Patient is being discharged due to                                                     ?????reaching max rehab potential.  Please sign this note for today's therapy session focusing on LTG #1 and to review pt's MBSS.     Problem List Patient Active Problem List   Diagnosis Date Noted  . Diarrhea 04/25/2015  . Cancer associated pain 04/25/2015  . Long term current use of anticoagulant therapy 04/25/2015  . Basal ganglia infarction ([place]) 02/12/2015  . Dizziness   . Expressive aphasia   . Past pointing   . Right leg weakness   . Acute ischemic stroke ([place]) 02/11/2015  . CVA (cerebral infarction) 02/11/2015  . Aspiration pneumonia ([place]) 02/11/2015  . Aphasia   . Difficulty speaking   . Speech difficult to understand   . Stroke ([place])   . Encounter for antineoplastic chemotherapy 01/31/2015  . Constipation 12/20/2014  . Rash 12/20/2014  . Cellulitis 12/06/2014  . Peripheral edema 12/06/2014  . Gait disorder 09/05/2014  . Multiple myeloma ([place])   . Paraplegia ([place]) 06/21/2014  . Postoperative anemia due to acute blood loss 06/21/2014  . 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 Spine metastasis ([place]) 06/17/2014  . Thoracic spine tumor 06/17/2014   . Metastatic bone cancer ([place]) 06/15/2014    [facility] 05/31/2015, 4:27 PM  [facility] [address] [place], [place], [address] Phone: [phone]   Fax:  [phone]   Name: Haile Bosler MRN: [id number] Date of Birth: [birth year]

## 2015-05-31 NOTE — Patient Instructions (Signed)
Add these exercises, twice a day, for swallowing:  1) Roll up a towel and put it under your chin. Hold it there for 45-60 seconds. Repeat 3 times.  2) Hold the rolled up towel under your chin with your hands and press your chin firmly on the towel for one second, 30 times. Repeat twice.   You may want to contact Dr. Naaman Plummer in about 6 weeks to see if it is any easier for you to swallow

## 2015-06-05 ENCOUNTER — Other Ambulatory Visit: Payer: Self-pay | Admitting: Medical Oncology

## 2015-06-05 DIAGNOSIS — C419 Malignant neoplasm of bone and articular cartilage, unspecified: Secondary | ICD-10-CM

## 2015-06-06 ENCOUNTER — Other Ambulatory Visit: Payer: Medicare Other

## 2015-06-06 ENCOUNTER — Other Ambulatory Visit (HOSPITAL_BASED_OUTPATIENT_CLINIC_OR_DEPARTMENT_OTHER): Payer: Medicare Other

## 2015-06-06 ENCOUNTER — Ambulatory Visit (HOSPITAL_BASED_OUTPATIENT_CLINIC_OR_DEPARTMENT_OTHER): Payer: Medicare Other

## 2015-06-06 VITALS — BP 111/47 | HR 74 | Temp 98.7°F

## 2015-06-06 DIAGNOSIS — Z5112 Encounter for antineoplastic immunotherapy: Secondary | ICD-10-CM

## 2015-06-06 DIAGNOSIS — C9000 Multiple myeloma not having achieved remission: Secondary | ICD-10-CM

## 2015-06-06 DIAGNOSIS — C419 Malignant neoplasm of bone and articular cartilage, unspecified: Secondary | ICD-10-CM

## 2015-06-06 LAB — CBC WITH DIFFERENTIAL/PLATELET
BASO%: 0.4 % (ref 0.0–2.0)
BASOS ABS: 0 10*3/uL (ref 0.0–0.1)
EOS%: 5.7 % (ref 0.0–7.0)
Eosinophils Absolute: 0.2 10*3/uL (ref 0.0–0.5)
HCT: 36.8 % — ABNORMAL LOW (ref 38.4–49.9)
HGB: 12.4 g/dL — ABNORMAL LOW (ref 13.0–17.1)
LYMPH#: 0.4 10*3/uL — AB (ref 0.9–3.3)
LYMPH%: 15.1 % (ref 14.0–49.0)
MCH: 32.9 pg (ref 27.2–33.4)
MCHC: 33.6 g/dL (ref 32.0–36.0)
MCV: 98 fL (ref 79.3–98.0)
MONO#: 0.3 10*3/uL (ref 0.1–0.9)
MONO%: 9.4 % (ref 0.0–14.0)
NEUT#: 2 10*3/uL (ref 1.5–6.5)
NEUT%: 69.4 % (ref 39.0–75.0)
Platelets: 87 10*3/uL — ABNORMAL LOW (ref 140–400)
RBC: 3.76 10*6/uL — ABNORMAL LOW (ref 4.20–5.82)
RDW: 16.6 % — AB (ref 11.0–14.6)
WBC: 3 10*3/uL — AB (ref 4.0–10.3)

## 2015-06-06 LAB — COMPREHENSIVE METABOLIC PANEL (CC13)
ALT: 17 U/L (ref 0–55)
AST: 17 U/L (ref 5–34)
Albumin: 3.5 g/dL (ref 3.5–5.0)
Alkaline Phosphatase: 59 U/L (ref 40–150)
Anion Gap: 5 mEq/L (ref 3–11)
BUN: 15.1 mg/dL (ref 7.0–26.0)
CHLORIDE: 104 meq/L (ref 98–109)
CO2: 31 mEq/L — ABNORMAL HIGH (ref 22–29)
Calcium: 8.9 mg/dL (ref 8.4–10.4)
Creatinine: 0.9 mg/dL (ref 0.7–1.3)
EGFR: 79 mL/min/{1.73_m2} — ABNORMAL LOW (ref >90)
GLUCOSE: 96 mg/dL (ref 70–140)
POTASSIUM: 4 meq/L (ref 3.5–5.1)
SODIUM: 141 meq/L (ref 136–145)
Total Bilirubin: 0.69 mg/dL (ref 0.20–1.20)
Total Protein: 5.5 g/dL — ABNORMAL LOW (ref 6.4–8.3)

## 2015-06-06 MED ORDER — ONDANSETRON 8 MG PO TBDP
8.0000 mg | ORAL_TABLET | Freq: Once | ORAL | Status: AC
  Administered 2015-06-06: 8 mg via ORAL
  Filled 2015-06-06: qty 1

## 2015-06-06 MED ORDER — ONDANSETRON HCL 8 MG PO TABS
ORAL_TABLET | ORAL | Status: AC
  Filled 2015-06-06: qty 1

## 2015-06-06 MED ORDER — BORTEZOMIB CHEMO SQ INJECTION 3.5 MG (2.5MG/ML)
1.3000 mg/m2 | Freq: Once | INTRAMUSCULAR | Status: AC
  Administered 2015-06-06: 2.5 mg via SUBCUTANEOUS
  Filled 2015-06-06: qty 1

## 2015-06-06 NOTE — Progress Notes (Signed)
Ok to treat per Dr. Feng 

## 2015-06-06 NOTE — Patient Instructions (Signed)
[facility] Discharge Instructions for Patients Receiving Chemotherapy  Today you received the following chemotherapy agents:  Velcade  To help prevent nausea and vomiting after your treatment, we encourage you to take your nausea medication as prescribed.   If you develop nausea and vomiting that is not controlled by your nausea medication, call the clinic.   BELOW ARE SYMPTOMS THAT SHOULD BE REPORTED IMMEDIATELY:  *FEVER GREATER THAN 100.5 F  *CHILLS WITH OR WITHOUT FEVER  NAUSEA AND VOMITING THAT IS NOT CONTROLLED WITH YOUR NAUSEA MEDICATION  *UNUSUAL SHORTNESS OF BREATH  *UNUSUAL BRUISING OR BLEEDING  TENDERNESS IN MOUTH AND THROAT WITH OR WITHOUT PRESENCE OF ULCERS  *URINARY PROBLEMS  *BOWEL PROBLEMS  UNUSUAL RASH Items with * indicate a potential emergency and should be followed up as soon as possible.  Feel free to call the clinic you have any questions or concerns. The clinic phone number is [phone].  Please show the CHEMO ALERT CARD at check-in to the Emergency Department and triage nurse.   

## 2015-06-12 ENCOUNTER — Other Ambulatory Visit: Payer: Self-pay | Admitting: *Deleted

## 2015-06-12 DIAGNOSIS — C9000 Multiple myeloma not having achieved remission: Secondary | ICD-10-CM

## 2015-06-13 ENCOUNTER — Ambulatory Visit (HOSPITAL_BASED_OUTPATIENT_CLINIC_OR_DEPARTMENT_OTHER): Payer: Medicare Other

## 2015-06-13 ENCOUNTER — Other Ambulatory Visit (HOSPITAL_BASED_OUTPATIENT_CLINIC_OR_DEPARTMENT_OTHER): Payer: Medicare Other

## 2015-06-13 VITALS — BP 129/56 | HR 78 | Temp 97.8°F | Resp 17

## 2015-06-13 DIAGNOSIS — Z5111 Encounter for antineoplastic chemotherapy: Secondary | ICD-10-CM

## 2015-06-13 DIAGNOSIS — C9000 Multiple myeloma not having achieved remission: Secondary | ICD-10-CM

## 2015-06-13 LAB — COMPREHENSIVE METABOLIC PANEL (CC13)
ALBUMIN: 3.4 g/dL — AB (ref 3.5–5.0)
ALK PHOS: 57 U/L (ref 40–150)
ALT: 18 U/L (ref 0–55)
ANION GAP: 7 meq/L (ref 3–11)
AST: 17 U/L (ref 5–34)
BILIRUBIN TOTAL: 0.85 mg/dL (ref 0.20–1.20)
BUN: 18.3 mg/dL (ref 7.0–26.0)
CALCIUM: 9 mg/dL (ref 8.4–10.4)
CO2: 30 mEq/L — ABNORMAL HIGH (ref 22–29)
Chloride: 102 mEq/L (ref 98–109)
Creatinine: 1 mg/dL (ref 0.7–1.3)
EGFR: 71 mL/min/{1.73_m2} — AB (ref >90)
GLUCOSE: 94 mg/dL (ref 70–140)
Potassium: 4.1 mEq/L (ref 3.5–5.1)
Sodium: 139 mEq/L (ref 136–145)
TOTAL PROTEIN: 5.6 g/dL — AB (ref 6.4–8.3)

## 2015-06-13 LAB — CBC WITH DIFFERENTIAL/PLATELET
BASO%: 0.4 % (ref 0.0–2.0)
Basophils Absolute: 0 10*3/uL (ref 0.0–0.1)
EOS%: 8.9 % — ABNORMAL HIGH (ref 0.0–7.0)
Eosinophils Absolute: 0.3 10*3/uL (ref 0.0–0.5)
HEMATOCRIT: 36.4 % — AB (ref 38.4–49.9)
HEMOGLOBIN: 12.3 g/dL — AB (ref 13.0–17.1)
LYMPH#: 0.4 10*3/uL — AB (ref 0.9–3.3)
LYMPH%: 15.3 % (ref 14.0–49.0)
MCH: 32.5 pg (ref 27.2–33.4)
MCHC: 33.8 g/dL (ref 32.0–36.0)
MCV: 96.3 fL (ref 79.3–98.0)
MONO#: 0.4 10*3/uL (ref 0.1–0.9)
MONO%: 14.2 % — ABNORMAL HIGH (ref 0.0–14.0)
NEUT#: 1.7 10*3/uL (ref 1.5–6.5)
NEUT%: 61.2 % (ref 39.0–75.0)
NRBC: 0 % (ref 0–0)
RBC: 3.78 10*6/uL — ABNORMAL LOW (ref 4.20–5.82)
RDW: 15.2 % — AB (ref 11.0–14.6)
WBC: 2.8 10*3/uL — AB (ref 4.0–10.3)

## 2015-06-13 LAB — TECHNOLOGIST REVIEW

## 2015-06-13 MED ORDER — ZOLEDRONIC ACID 4 MG/100ML IV SOLN
4.0000 mg | Freq: Once | INTRAVENOUS | Status: AC
  Administered 2015-06-13: 4 mg via INTRAVENOUS
  Filled 2015-06-13: qty 100

## 2015-06-13 MED ORDER — ONDANSETRON 8 MG PO TBDP
8.0000 mg | ORAL_TABLET | Freq: Once | ORAL | Status: AC
  Administered 2015-06-13: 8 mg via ORAL
  Filled 2015-06-13: qty 1

## 2015-06-13 MED ORDER — BORTEZOMIB CHEMO SQ INJECTION 3.5 MG (2.5MG/ML)
1.3000 mg/m2 | Freq: Once | INTRAMUSCULAR | Status: AC
  Administered 2015-06-13: 2.5 mg via SUBCUTANEOUS
  Filled 2015-06-13: qty 2.5

## 2015-06-13 MED ORDER — ONDANSETRON HCL 8 MG PO TABS
ORAL_TABLET | ORAL | Status: AC
  Filled 2015-06-13: qty 1

## 2015-06-13 NOTE — Patient Instructions (Signed)
[facility] Discharge Instructions for Patients Receiving Chemotherapy  Today you received the following chemotherapy agents Velcade and Zometa  To help prevent nausea and vomiting after your treatment, we encourage you to take your nausea medication as directed    If you develop nausea and vomiting that is not controlled by your nausea medication, call the clinic.   BELOW ARE SYMPTOMS THAT SHOULD BE REPORTED IMMEDIATELY:  *FEVER GREATER THAN 100.5 F  *CHILLS WITH OR WITHOUT FEVER  NAUSEA AND VOMITING THAT IS NOT CONTROLLED WITH YOUR NAUSEA MEDICATION  *UNUSUAL SHORTNESS OF BREATH  *UNUSUAL BRUISING OR BLEEDING  TENDERNESS IN MOUTH AND THROAT WITH OR WITHOUT PRESENCE OF ULCERS  *URINARY PROBLEMS  *BOWEL PROBLEMS  UNUSUAL RASH Items with * indicate a potential emergency and should be followed up as soon as possible.  Feel free to call the clinic you have any questions or concerns. The clinic phone number is [phone].  Please show the CHEMO ALERT CARD at check-in to the Emergency Department and triage nurse.   

## 2015-06-13 NOTE — Progress Notes (Signed)
Verbal consent given per Dr. Julien Nordmann to treat today with platelets at 32

## 2015-06-15 ENCOUNTER — Other Ambulatory Visit: Payer: Self-pay | Admitting: *Deleted

## 2015-06-15 DIAGNOSIS — C9000 Multiple myeloma not having achieved remission: Secondary | ICD-10-CM

## 2015-06-15 MED ORDER — LENALIDOMIDE 25 MG PO CAPS: 25.0000 mg | ORAL_CAPSULE | Freq: Every day | ORAL | Status: DC

## 2015-06-15 NOTE — Telephone Encounter (Signed)
Revlimid survey completed, Authorization # [id number] Rx unable to e-scribe, rx faxed to [facility]

## 2015-06-20 ENCOUNTER — Ambulatory Visit (HOSPITAL_BASED_OUTPATIENT_CLINIC_OR_DEPARTMENT_OTHER): Payer: Medicare Other

## 2015-06-20 ENCOUNTER — Ambulatory Visit (HOSPITAL_BASED_OUTPATIENT_CLINIC_OR_DEPARTMENT_OTHER): Payer: Medicare Other | Admitting: Physician Assistant

## 2015-06-20 ENCOUNTER — Telehealth: Payer: Self-pay | Admitting: Internal Medicine

## 2015-06-20 ENCOUNTER — Other Ambulatory Visit (HOSPITAL_BASED_OUTPATIENT_CLINIC_OR_DEPARTMENT_OTHER): Payer: Medicare Other

## 2015-06-20 VITALS — BP 105/82 | HR 82 | Temp 98.2°F | Resp 18 | Ht 69.0 in | Wt 160.4 lb

## 2015-06-20 DIAGNOSIS — Z5112 Encounter for antineoplastic immunotherapy: Secondary | ICD-10-CM | POA: Diagnosis present

## 2015-06-20 DIAGNOSIS — C9000 Multiple myeloma not having achieved remission: Secondary | ICD-10-CM

## 2015-06-20 DIAGNOSIS — G620 Drug-induced polyneuropathy: Secondary | ICD-10-CM | POA: Diagnosis not present

## 2015-06-20 LAB — COMPREHENSIVE METABOLIC PANEL (CC13)
ALBUMIN: 3.3 g/dL — AB (ref 3.5–5.0)
ALK PHOS: 59 U/L (ref 40–150)
ALT: 20 U/L (ref 0–55)
ANION GAP: 8 meq/L (ref 3–11)
AST: 17 U/L (ref 5–34)
BUN: 16.6 mg/dL (ref 7.0–26.0)
CALCIUM: 8.7 mg/dL (ref 8.4–10.4)
CO2: 29 mEq/L (ref 22–29)
CREATININE: 1 mg/dL (ref 0.7–1.3)
Chloride: 104 mEq/L (ref 98–109)
EGFR: 69 mL/min/{1.73_m2} — ABNORMAL LOW (ref >90)
Glucose: 85 mg/dl (ref 70–140)
POTASSIUM: 4 meq/L (ref 3.5–5.1)
Sodium: 141 mEq/L (ref 136–145)
Total Bilirubin: 0.75 mg/dL (ref 0.20–1.20)
Total Protein: 5.4 g/dL — ABNORMAL LOW (ref 6.4–8.3)

## 2015-06-20 LAB — LACTATE DEHYDROGENASE (CC13): LDH: 176 U/L (ref 125–245)

## 2015-06-20 LAB — CBC WITH DIFFERENTIAL/PLATELET
BASO%: 0.5 % (ref 0.0–2.0)
BASOS ABS: 0 10*3/uL (ref 0.0–0.1)
EOS ABS: 0.4 10*3/uL (ref 0.0–0.5)
EOS%: 13.5 % — AB (ref 0.0–7.0)
HEMATOCRIT: 36.1 % — AB (ref 38.4–49.9)
HEMOGLOBIN: 11.9 g/dL — AB (ref 13.0–17.1)
LYMPH#: 0.4 10*3/uL — AB (ref 0.9–3.3)
LYMPH%: 13.5 % — ABNORMAL LOW (ref 14.0–49.0)
MCH: 32.5 pg (ref 27.2–33.4)
MCHC: 33 g/dL (ref 32.0–36.0)
MCV: 98.4 fL — ABNORMAL HIGH (ref 79.3–98.0)
MONO#: 0.4 10*3/uL (ref 0.1–0.9)
MONO%: 13.5 % (ref 0.0–14.0)
NEUT#: 1.7 10*3/uL (ref 1.5–6.5)
NEUT%: 59 % (ref 39.0–75.0)
PLATELETS: 78 10*3/uL — AB (ref 140–400)
RBC: 3.67 10*6/uL — ABNORMAL LOW (ref 4.20–5.82)
RDW: 16.7 % — AB (ref 11.0–14.6)
WBC: 2.8 10*3/uL — ABNORMAL LOW (ref 4.0–10.3)

## 2015-06-20 MED ORDER — BORTEZOMIB CHEMO SQ INJECTION 3.5 MG (2.5MG/ML)
1.3000 mg/m2 | Freq: Once | INTRAMUSCULAR | Status: AC
  Administered 2015-06-20: 2.5 mg via SUBCUTANEOUS
  Filled 2015-06-20: qty 2.5

## 2015-06-20 MED ORDER — ONDANSETRON HCL 8 MG PO TABS
ORAL_TABLET | ORAL | Status: AC
  Filled 2015-06-20: qty 1

## 2015-06-20 MED ORDER — ONDANSETRON 8 MG PO TBDP
8.0000 mg | ORAL_TABLET | Freq: Once | ORAL | Status: AC
  Administered 2015-06-20: 8 mg via ORAL
  Filled 2015-06-20: qty 1

## 2015-06-20 NOTE — Patient Instructions (Signed)
[facility] Discharge Instructions for Patients Receiving Chemotherapy  Today you received the following chemotherapy agents Velcade. To help prevent nausea and vomiting after your treatment, we encourage you to take your nausea medication as directed.  If you develop nausea and vomiting that is not controlled by your nausea medication, call the clinic.   BELOW ARE SYMPTOMS THAT SHOULD BE REPORTED IMMEDIATELY:  *FEVER GREATER THAN 100.5 F  *CHILLS WITH OR WITHOUT FEVER  NAUSEA AND VOMITING THAT IS NOT CONTROLLED WITH YOUR NAUSEA MEDICATION  *UNUSUAL SHORTNESS OF BREATH  *UNUSUAL BRUISING OR BLEEDING  TENDERNESS IN MOUTH AND THROAT WITH OR WITHOUT PRESENCE OF ULCERS  *URINARY PROBLEMS  *BOWEL PROBLEMS  UNUSUAL RASH Items with * indicate a potential emergency and should be followed up as soon as possible.  Feel free to call the clinic you have any questions or concerns. The clinic phone number is [phone].  Please show the CHEMO ALERT CARD at check-in to the Emergency Department and triage nurse.    

## 2015-06-20 NOTE — Progress Notes (Signed)
[facility] [phone]   [phone] [place] [address] [employer] [address] [place] [address]  DIAGNOSIS: Multiple myeloma diagnosed in November 2015  PRIOR THERAPY:  1) Status post Thoracic five laminectomy for epidural tumor resection, Thoracic 4-7 posterior lateral arthrodesis,  segmental pedicle screw fixation T4-T7 Gl[facility] instrumentation under the care of Dr. Christella Owens on 06/18/2014. 2) Systemic chemotherapy with Velcade 1.3 MG/M2 subcutaneously weekly in addition to Decadron 40 mg by mouth on weekly basis. Status post 11 cycles. 3) palliative radiotherapy to the right proximal humerus, shoulder and scapula under the care of Dr. Lisbeth Owens completed on 12/12/2014.  CURRENT THERAPY:  1) Systemic chemotherapy with Velcade 1.3 MG/M2 subcutaneously weekly, Decadron 40 mg by mouth weekly in addition to Revlimid 25 MG by mouth daily for 21 days every 4 weeks, status post 10 cycles. He is expected to start cycle #11 week. 2) Zometa 4 mg IV every month. First dose 11/01/2014.  INTERVAL HISTORY: Jeremiah Owens 81 y.o. male returns to the clinic today for follow-up visit accompanied by his wife. The patient is currently on treatment with weekly subcutaneous Velcade, Revlimid and Decadron status post 10 cycle and tolerating the treatment fairly well. He denied having any significant nausea or vomiting, no fever or chills. He denied having any significant weight loss or night sweats. He denied having any significant chest pain, shortness of breath, cough or hemoptysis. He is bruising more easily. In addition, he is experiencing more neuropathy on the lower extremities. However, he is still able to ambulate with a walker. He is here today for evaluation and repeat blood work.  MEDICAL HISTORY: Past Medical History  Diagnosis Date  . Compression fracture   . Allergy   . Anxiety   . Thyroid disease   . S/P radiation therapy  08/21/14-09/04/14    T4-6 25Gy/109f  . S/P radiation therapy 11/29/14-12/12/14    rt prox humerus/shoulder/scapula 25Gy/168f . Encounter for antineoplastic chemotherapy 01/31/2015  . Melanoma ([facility]  . Bone cancer ([facility]    t_spine T-5  . Pro[facility] ([facility]  . Skin cancer 1994    melanoma  right neck   . Multiple myeloma ([facility]  . Hearing loss   . [facility]    ALLERGIES:  is allergic to iodine.  MEDICATIONS:  Current Outpatient Prescriptions  Medication Sig Dispense Refill  . acyclovir (ZOVIRAX) 400 MG tablet TAKE 1 TABLET BY MOUTH TWICE A DAY 60 tablet 2  . aspirin 81 MG EC tablet Take 81 mg by mouth daily.  0  . Calcium Carbonate-Vitamin D (CALTRATE 600+D PO) Take 600 mg by mouth 2 (two) times daily.    . cholecalciferol (VITAMIN D) 1000 UNITS tablet Take 1,000 Units by mouth at bedtime.     . Marland Kitchenexamethasone (DECADRON) 4 MG tablet TAKE 10 TABS EVERY WEEK, START WITH CHEMO 40 tablet 4  . furosemide (LASIX) 20 MG tablet Take 20 mg by mouth 2 (two) times daily.    . Marland KitchenYDROcodone-acetaminophen (NORCO) 5-325 MG per tablet Take 1 tablet by mouth every 6 (six) hours as needed for moderate pain. (Patient not taking: Reported on 05/23/2015) 60 tablet 0  . KLOR-CON 10 10 MEQ tablet Take 10 mEq by mouth daily.    . Marland Kitchenenalidomide (REVLIMID) 25 MG capsule Take 1 capsule (25 mg total) by mouth daily. Take one capsule ( 25 mg) by mouth for 21 days every 4 weeks-05/10/15 Authorization  number=  [id number] adult male 21 capsule 0  . levothyroxine (SYNTHROID, LEVOTHROID) 50 MCG tablet Take 50 mcg by mouth daily.  1  . methocarbamol (ROBAXIN) 500 MG tablet TAKE 1 TABLET (500 MG TOTAL) BY MOUTH EVERY 6 (SIX) HOURS AS NEEDED FOR MUSCLE SPASMS. (Patient not taking: Reported on 05/23/2015) 180 tablet 4  . Multiple Vitamins-Minerals (CENTRUM SILVER PO) Take 1 tablet by mouth daily.    Marland Kitchen omeprazole (PRILOSEC) 40 MG capsule Take 40 mg by mouth daily.    . prochlorperazine (COMPAZINE) 10 MG tablet Take 10  mg by mouth daily.  0  . ranitidine (ZANTAC) 150 MG tablet Take 150 mg by mouth daily as needed for heartburn.   11  . sennosides-docusate sodium (SENOKOT-S) 8.6-50 MG tablet Take 1-2 tablets by mouth 2 (two) times daily as needed for constipation. Takes 1-2 tabs daily    . tamsulosin (FLOMAX) 0.4 MG CAPS capsule Take 1 capsule (0.4 mg total) by mouth at bedtime. 30 capsule 3  . warfarin (COUMADIN) 2 MG tablet Take 1 tablet (2 mg total) by mouth daily. 90 tablet 4   No current facility-administered medications for this visit.    SURGICAL HISTORY:  Past Surgical History  Procedure Laterality Date  . Hernia repair    . Posterior cervical fusion/foraminotomy N/A 06/17/2014    Procedure: Thoracic five laminectomy for epidural tumor resection Thoracic 4-7 posterior lateral arthrodesis, segmental pedicle screw fixation.;  Surgeon: Jeremiah Pall, MD;  Location: [facility];  Service: Neurosurgery;  Laterality: N/A;    REVIEW OF SYSTEMS:  A comprehensive review of systems was negative except for: Constitutional: positive for fatigue   PHYSICAL EXAMINATION: General appearance: alert, cooperative, fatigued and no distress Head: Normocephalic, without obvious abnormality, atraumatic Neck: no adenopathy, no JVD, supple, symmetrical, trachea midline and thyroid not enlarged, symmetric, no tenderness/mass/nodules Lymph nodes: Cervical, supraclavicular, and axillary nodes normal. Resp: clear to auscultation bilaterally Back: symmetric, no curvature. ROM normal. No CVA tenderness. Cardio: regular rate and rhythm, S1, S2 normal, no murmur, click, rub or gallop GI: soft, non-tender; bowel sounds normal; no masses,  no organomegaly Extremities: edema 2+ edema bilaterally with mild erythema on the legs. Mild bruising is noted at the venipuncture site. Neurologic: Alert and oriented X 3, normal strength and tone. Normal symmetric reflexes. Normal coordination and gait Motor: grossly normal Weakness in the  left foot  ECOG PERFORMANCE STATUS: 1 - Symptomatic but completely ambulatory  Blood pressure 105/82, pulse 82, temperature 98.2 F (36.8 C), temperature source Oral, resp. rate 18, height _0  (1.753 m), weight 160 lb 6.4 oz (72.757 kg).  LABORATORY DATA: CBC Latest Ref Rng 06/20/2015 06/13/2015 06/06/2015  WBC 4.0 - 10.3 10e3/uL 2.8(L) 2.8(L) 3.0(L)  Hemoglobin 13.0 - 17.1 g/dL 11.9(L) 12.3(L) 12.4(L)  Hematocrit 38.4 - 49.9 % 36.1(L) 36.4(L) 36.8(L)  Platelets 140 - 400 10e3/uL 78(L) 80 Few Large platelets present(L) 87(L)   CMP Latest Ref Rng 06/20/2015 06/13/2015 06/06/2015  Glucose 70 - 140 mg/dl 85 94 96  BUN 7.0 - 26.0 mg/dL 16.6 18.3 15.1  Creatinine 0.7 - 1.3 mg/dL 1.0 1.0 0.9  Sodium 136 - 145 mEq/L 141 139 141  Potassium 3.5 - 5.1 mEq/L 4.0 4.1 4.0  Chloride 101 - 111 mmol/L - - -  CO2 22 - 29 mEq/L 29 30(H) 31(H)  Calcium 8.4 - 10.4 mg/dL 8.7 9.0 8.9  Total Protein 6.4 - 8.3 g/dL 5.4(L) 5.6(L) 5.5(L)  Total Bilirubin 0.20 - 1.20 mg/dL 0.75 0.85 0.69  Alkaline Phos  40 - 150 U/L 59 57 59  AST 5 - 34 U/L _0 ALT 0 - 55 U/L _1 RADIOGRAPHIC STUDIES: None  ASSESSMENT AND PLAN: This is a very pleasant 81 years old white male recently diagnosed with multiple myeloma with plasmacytoma at the T5 vertebrae as well as epidural tumor status post resection by neurosurgery. He completed treatment with subcutaneous Velcade and Decadron status post 11 weekly doses. This was discontinued secondary to disease progression. The patient was started on treatment with weekly subcutaneous Velcade, Revlimid for 21 days every 4 weeks in addition to Decadron 40 mg weekly status post 10 cycle and he is tolerating it fairly well.  Recommended for the patient to continue his treatment today as a scheduled, cycle 11. He would come back for follow-up visit in 4 weeks for reevaluation In 3 weeks, it was recommended that the patient returns for protein studies, hopefully these labs would  be available at the time of the scheduled visit with Dr. Julien Nordmann He will continue with Zometa infusion as scheduled. For his neuropathy, he would report if this becomes bothersome to limit his activities of daily living, at which time, we will evaluate the dose of this therapy. He was advised to call immediately if he has any concerning symptoms in the interval. The patient voices understanding of current disease status and treatment options and is in agreement with the current care plan.  All questions were answered. The patient knows to call the clinic with any problems, questions or concerns. We can certainly see the patient much sooner if necessary.  Disclaimer: This note was dictated with voice recognition software. Similar sounding words can inadvertently be transcribed and may not be corrected upon review.  ADDENDUM: Hematology/Oncology Attending:  I had a face to face encounter with the patient. I recommended his care plan.  This is a very pleasant 81 years old white male with multiple myeloma currently undergoing systemic chemotherapy with subcutaneous tinnitus Velcade, Decadron and Revlimid status post 10 cycles and has been tolerating his treatment fairly well except for the peripheral neuropathy.  I recommended for the patient to proceed with cycle #11 today as a scheduled. I will see him back for follow-up visit in 4 weeks for reevaluation after repeating myeloma panel for  evaluation of his disease.  he will continue treatment with Zometa as previously scheduled.  The patient was advised to call immediately if he has any concerning symptoms in the interval.  Disclaimer: This note was dictated with voice recognition software. Similar sounding words can inadvertently be transcribed and may be missed upon review. Eilleen Kempf., MD 06/20/2015

## 2015-06-20 NOTE — Telephone Encounter (Signed)
Gave and printed appt sched for NOV and DEc

## 2015-06-20 NOTE — Progress Notes (Signed)
Lab reviewed by Dr. Julien Nordmann; okay to proceed with treatment today.

## 2015-06-27 ENCOUNTER — Ambulatory Visit (HOSPITAL_BASED_OUTPATIENT_CLINIC_OR_DEPARTMENT_OTHER): Payer: Medicare Other

## 2015-06-27 ENCOUNTER — Other Ambulatory Visit (HOSPITAL_BASED_OUTPATIENT_CLINIC_OR_DEPARTMENT_OTHER): Payer: Medicare Other

## 2015-06-27 VITALS — BP 96/61 | HR 71 | Temp 97.6°F | Resp 16

## 2015-06-27 DIAGNOSIS — Z5112 Encounter for antineoplastic immunotherapy: Secondary | ICD-10-CM | POA: Diagnosis present

## 2015-06-27 DIAGNOSIS — C9001 Multiple myeloma in remission: Secondary | ICD-10-CM

## 2015-06-27 DIAGNOSIS — C9000 Multiple myeloma not having achieved remission: Secondary | ICD-10-CM

## 2015-06-27 DIAGNOSIS — C419 Malignant neoplasm of bone and articular cartilage, unspecified: Secondary | ICD-10-CM

## 2015-06-27 LAB — CBC WITH DIFFERENTIAL/PLATELET
BASO%: 0.7 % (ref 0.0–2.0)
BASOS ABS: 0 10*3/uL (ref 0.0–0.1)
EOS%: 7.9 % — AB (ref 0.0–7.0)
Eosinophils Absolute: 0.2 10*3/uL (ref 0.0–0.5)
HCT: 37.6 % — ABNORMAL LOW (ref 38.4–49.9)
HGB: 12.6 g/dL — ABNORMAL LOW (ref 13.0–17.1)
LYMPH#: 0.6 10*3/uL — AB (ref 0.9–3.3)
LYMPH%: 20.6 % (ref 14.0–49.0)
MCH: 32.7 pg (ref 27.2–33.4)
MCHC: 33.5 g/dL (ref 32.0–36.0)
MCV: 97.7 fL (ref 79.3–98.0)
MONO#: 0.4 10*3/uL (ref 0.1–0.9)
MONO%: 14.4 % — AB (ref 0.0–14.0)
NEUT#: 1.6 10*3/uL (ref 1.5–6.5)
NEUT%: 56.4 % (ref 39.0–75.0)
Platelets: 110 10*3/uL — ABNORMAL LOW (ref 140–400)
RBC: 3.85 10*6/uL — AB (ref 4.20–5.82)
RDW: 15.5 % — ABNORMAL HIGH (ref 11.0–14.6)
WBC: 2.8 10*3/uL — ABNORMAL LOW (ref 4.0–10.3)

## 2015-06-27 LAB — BASIC METABOLIC PANEL (CC13)
ANION GAP: 7 meq/L (ref 3–11)
BUN: 16.6 mg/dL (ref 7.0–26.0)
CALCIUM: 9 mg/dL (ref 8.4–10.4)
CO2: 29 meq/L (ref 22–29)
Chloride: 103 mEq/L (ref 98–109)
Creatinine: 0.9 mg/dL (ref 0.7–1.3)
EGFR: 79 mL/min/{1.73_m2} — ABNORMAL LOW (ref >90)
GLUCOSE: 81 mg/dL (ref 70–140)
Potassium: 3.7 mEq/L (ref 3.5–5.1)
Sodium: 139 mEq/L (ref 136–145)

## 2015-06-27 MED ORDER — ONDANSETRON HCL 8 MG PO TABS
ORAL_TABLET | ORAL | Status: AC
  Filled 2015-06-27: qty 1

## 2015-06-27 MED ORDER — BORTEZOMIB CHEMO SQ INJECTION 3.5 MG (2.5MG/ML)
1.3000 mg/m2 | Freq: Once | INTRAMUSCULAR | Status: AC
  Administered 2015-06-27: 2.5 mg via SUBCUTANEOUS
  Filled 2015-06-27: qty 2.5

## 2015-06-27 MED ORDER — ONDANSETRON 8 MG PO TBDP
8.0000 mg | ORAL_TABLET | Freq: Once | ORAL | Status: AC
  Administered 2015-06-27: 8 mg via ORAL
  Filled 2015-06-27: qty 1

## 2015-06-27 NOTE — Patient Instructions (Signed)
[facility] Discharge Instructions for Patients Receiving Chemotherapy  Today you received the following chemotherapy agents Velcade  To help prevent nausea and vomiting after your treatment, we encourage you to take your nausea medication    If you develop nausea and vomiting that is not controlled by your nausea medication, call the clinic.   BELOW ARE SYMPTOMS THAT SHOULD BE REPORTED IMMEDIATELY:  *FEVER GREATER THAN 100.5 F  *CHILLS WITH OR WITHOUT FEVER  NAUSEA AND VOMITING THAT IS NOT CONTROLLED WITH YOUR NAUSEA MEDICATION  *UNUSUAL SHORTNESS OF BREATH  *UNUSUAL BRUISING OR BLEEDING  TENDERNESS IN MOUTH AND THROAT WITH OR WITHOUT PRESENCE OF ULCERS  *URINARY PROBLEMS  *BOWEL PROBLEMS  UNUSUAL RASH Items with * indicate a potential emergency and should be followed up as soon as possible.  Feel free to call the clinic you have any questions or concerns. The clinic phone number is [phone].  Please show the CHEMO ALERT CARD at check-in to the Emergency Department and triage nurse.   

## 2015-07-03 ENCOUNTER — Other Ambulatory Visit: Payer: Self-pay | Admitting: Medical Oncology

## 2015-07-03 DIAGNOSIS — C419 Malignant neoplasm of bone and articular cartilage, unspecified: Secondary | ICD-10-CM

## 2015-07-04 ENCOUNTER — Other Ambulatory Visit (HOSPITAL_BASED_OUTPATIENT_CLINIC_OR_DEPARTMENT_OTHER): Payer: Medicare Other

## 2015-07-04 ENCOUNTER — Ambulatory Visit (HOSPITAL_BASED_OUTPATIENT_CLINIC_OR_DEPARTMENT_OTHER): Payer: Medicare Other

## 2015-07-04 VITALS — BP 125/52 | HR 76 | Temp 97.2°F | Resp 18

## 2015-07-04 DIAGNOSIS — C9000 Multiple myeloma not having achieved remission: Secondary | ICD-10-CM | POA: Diagnosis present

## 2015-07-04 DIAGNOSIS — Z5112 Encounter for antineoplastic immunotherapy: Secondary | ICD-10-CM

## 2015-07-04 DIAGNOSIS — C419 Malignant neoplasm of bone and articular cartilage, unspecified: Secondary | ICD-10-CM

## 2015-07-04 LAB — CBC WITH DIFFERENTIAL/PLATELET
BASO%: 0.4 % (ref 0.0–2.0)
Basophils Absolute: 0 10*3/uL (ref 0.0–0.1)
EOS ABS: 0.2 10*3/uL (ref 0.0–0.5)
EOS%: 7.4 % — ABNORMAL HIGH (ref 0.0–7.0)
HEMATOCRIT: 36 % — AB (ref 38.4–49.9)
HEMOGLOBIN: 12.2 g/dL — AB (ref 13.0–17.1)
LYMPH#: 0.4 10*3/uL — AB (ref 0.9–3.3)
LYMPH%: 14.4 % (ref 14.0–49.0)
MCH: 32.9 pg (ref 27.2–33.4)
MCHC: 33.9 g/dL (ref 32.0–36.0)
MCV: 97 fL (ref 79.3–98.0)
MONO#: 0.2 10*3/uL (ref 0.1–0.9)
MONO%: 8.9 % (ref 0.0–14.0)
NEUT%: 68.9 % (ref 39.0–75.0)
NEUTROS ABS: 1.9 10*3/uL (ref 1.5–6.5)
PLATELETS: 88 10*3/uL — AB (ref 140–400)
RBC: 3.71 10*6/uL — ABNORMAL LOW (ref 4.20–5.82)
RDW: 15.5 % — AB (ref 11.0–14.6)
WBC: 2.7 10*3/uL — ABNORMAL LOW (ref 4.0–10.3)

## 2015-07-04 LAB — COMPREHENSIVE METABOLIC PANEL (CC13)
ALBUMIN: 3.5 g/dL (ref 3.5–5.0)
ALK PHOS: 54 U/L (ref 40–150)
ALT: 16 U/L (ref 0–55)
AST: 17 U/L (ref 5–34)
Anion Gap: 7 mEq/L (ref 3–11)
BILIRUBIN TOTAL: 0.82 mg/dL (ref 0.20–1.20)
BUN: 15.8 mg/dL (ref 7.0–26.0)
CALCIUM: 9 mg/dL (ref 8.4–10.4)
CO2: 31 mEq/L — ABNORMAL HIGH (ref 22–29)
Chloride: 102 mEq/L (ref 98–109)
Creatinine: 1 mg/dL (ref 0.7–1.3)
EGFR: 69 mL/min/{1.73_m2} — AB (ref >90)
GLUCOSE: 94 mg/dL (ref 70–140)
POTASSIUM: 4.1 meq/L (ref 3.5–5.1)
SODIUM: 140 meq/L (ref 136–145)
TOTAL PROTEIN: 5.7 g/dL — AB (ref 6.4–8.3)

## 2015-07-04 MED ORDER — ONDANSETRON 8 MG PO TBDP
8.0000 mg | ORAL_TABLET | Freq: Once | ORAL | Status: AC
  Administered 2015-07-04: 8 mg via ORAL
  Filled 2015-07-04: qty 1

## 2015-07-04 MED ORDER — BORTEZOMIB CHEMO SQ INJECTION 3.5 MG (2.5MG/ML)
1.3000 mg/m2 | Freq: Once | INTRAMUSCULAR | Status: AC
  Administered 2015-07-04: 2.5 mg via SUBCUTANEOUS
  Filled 2015-07-04: qty 2.5

## 2015-07-04 MED ORDER — ONDANSETRON HCL 8 MG PO TABS
ORAL_TABLET | ORAL | Status: AC
  Filled 2015-07-04: qty 1

## 2015-07-04 NOTE — Patient Instructions (Signed)
[facility] Discharge Instructions for Patients Receiving Chemotherapy  Today you received the following chemotherapy agents Velcade  To help prevent nausea and vomiting after your treatment, we encourage you to take your nausea medication    If you develop nausea and vomiting that is not controlled by your nausea medication, call the clinic.   BELOW ARE SYMPTOMS THAT SHOULD BE REPORTED IMMEDIATELY:  *FEVER GREATER THAN 100.5 F  *CHILLS WITH OR WITHOUT FEVER  NAUSEA AND VOMITING THAT IS NOT CONTROLLED WITH YOUR NAUSEA MEDICATION  *UNUSUAL SHORTNESS OF BREATH  *UNUSUAL BRUISING OR BLEEDING  TENDERNESS IN MOUTH AND THROAT WITH OR WITHOUT PRESENCE OF ULCERS  *URINARY PROBLEMS  *BOWEL PROBLEMS  UNUSUAL RASH Items with * indicate a potential emergency and should be followed up as soon as possible.  Feel free to call the clinic you have any questions or concerns. The clinic phone number is [phone].  Please show the CHEMO ALERT CARD at check-in to the Emergency Department and triage nurse.   

## 2015-07-04 NOTE — Progress Notes (Signed)
Ok to continue treatment today with Platelets at 58 per Dr. Julien Nordmann.

## 2015-07-06 DIAGNOSIS — Z85828 Personal history of other malignant neoplasm of skin: Secondary | ICD-10-CM | POA: Diagnosis not present

## 2015-07-06 DIAGNOSIS — L821 Other seborrheic keratosis: Secondary | ICD-10-CM | POA: Diagnosis not present

## 2015-07-06 DIAGNOSIS — L308 Other specified dermatitis: Secondary | ICD-10-CM | POA: Diagnosis not present

## 2015-07-06 DIAGNOSIS — L814 Other melanin hyperpigmentation: Secondary | ICD-10-CM | POA: Diagnosis not present

## 2015-07-06 DIAGNOSIS — L570 Actinic keratosis: Secondary | ICD-10-CM | POA: Diagnosis not present

## 2015-07-10 ENCOUNTER — Encounter: Payer: Self-pay | Admitting: Physical Medicine & Rehabilitation

## 2015-07-10 ENCOUNTER — Ambulatory Visit: Payer: Medicare Other | Admitting: Physical Medicine & Rehabilitation

## 2015-07-10 ENCOUNTER — Encounter: Payer: Medicare Other | Attending: Physical Medicine & Rehabilitation | Admitting: Physical Medicine & Rehabilitation

## 2015-07-10 VITALS — BP 121/66 | HR 79

## 2015-07-10 DIAGNOSIS — R1310 Dysphagia, unspecified: Secondary | ICD-10-CM | POA: Diagnosis not present

## 2015-07-10 DIAGNOSIS — G8220 Paraplegia, unspecified: Secondary | ICD-10-CM | POA: Insufficient documentation

## 2015-07-10 DIAGNOSIS — C9000 Multiple myeloma not having achieved remission: Secondary | ICD-10-CM | POA: Diagnosis not present

## 2015-07-10 DIAGNOSIS — I639 Cerebral infarction, unspecified: Secondary | ICD-10-CM | POA: Diagnosis not present

## 2015-07-10 DIAGNOSIS — R4701 Aphasia: Secondary | ICD-10-CM | POA: Diagnosis not present

## 2015-07-10 DIAGNOSIS — R269 Unspecified abnormalities of gait and mobility: Secondary | ICD-10-CM | POA: Insufficient documentation

## 2015-07-10 DIAGNOSIS — I69391 Dysphagia following cerebral infarction: Secondary | ICD-10-CM | POA: Insufficient documentation

## 2015-07-10 DIAGNOSIS — I6381 Other cerebral infarction due to occlusion or stenosis of small artery: Secondary | ICD-10-CM

## 2015-07-10 NOTE — Progress Notes (Signed)
Subjective:    Patient ID: Jeremiah Owens, male    DOB: October 05, 1929, 81 y.o.   MRN: [id number]  HPI   Jeremiah Owens is here in follow up of his left basal ganglia infarct. He has been doing well for the most part. He would like to be on thin liquids but his MBS showed penetration of thins on 10/27. SLP recommended followup study this month. It looks as if he's still on a regular diet, but he still has occasional coughing with his thins. He hasn't had any fevers or pneumonia.   His balance has been better. He has been using the rolling walker outside the house and his cane within the house. He hasn't had any falls, but he's lost his balance on occasion.    Pain Inventory Average Pain 1 Pain Right Now 1 My pain is NA  In the last 24 hours, has pain interfered with the following? General activity 0 Relation with others 0 Enjoyment of life 0 What TIME of day is your pain at its worst? evening Sleep (in general) Good  Pain is worse with: NA Pain improves with: NA Relief from Meds: NA  Mobility walk with assistance use a cane use a walker how many minutes can you walk? 30 ability to climb steps?  yes do you drive?  yes  Function retired Do you have any goals in this area?  yes  Neuro/Psych bowel control problems  Prior Studies Any changes since last visit?  no  Physicians involved in your care Any changes since last visit?  no   Family History  Problem Relation Age of Onset  . Cancer Mother     breast  . Stroke Mother    Social History   Social History  . Marital Status: Married    Spouse Name: N/A  . Number of Children: N/A  . Years of Education: N/A   Social History Main Topics  . Smoking status: Never Smoker   . Smokeless tobacco: None  . Alcohol Use: No  . Drug Use: No  . Sexual Activity: Not Asked   Other Topics Concern  . None   Social History Narrative   Past Surgical History  Procedure Laterality Date  . Hernia repair    . Posterior  cervical fusion/foraminotomy N/A 06/17/2014    Procedure: Thoracic five laminectomy for epidural tumor resection Thoracic 4-7 posterior lateral arthrodesis, segmental pedicle screw fixation.;  Surgeon: Ashok Pall, MD;  Location: [facility];  Service: Neurosurgery;  Laterality: N/A;   Past Medical History  Diagnosis Date  . Compression fracture   . Allergy   . Anxiety   . Thyroid disease   . S/P radiation therapy 08/21/14-09/04/14    T4-6 25Gy/27f  . S/P radiation therapy 11/29/14-12/12/14    rt prox humerus/shoulder/scapula 25Gy/155f . Encounter for antineoplastic chemotherapy 01/31/2015  . [place]anoma ([facility]  . Bone cancer ([facility]    t_spine T-5  . Prostate cancer ([facility]  . Skin cancer 1994    melanoma  right neck   . Multiple myeloma ([facility]CC[place]  . Hearing loss   . Stroke ([facility]   BP 121/66 mmHg  Pulse 79  SpO2 96%  Opioid Risk Score:   Fall Risk Score:  `1  Depression screen PHQ 2/9  [facility]/9 04/10/2015 11/07/2014  Decreased Interest 0 0  Down, Depressed, Hopeless 0 0  PHQ - 2 Score 0 0  Altered sleeping 1 0  Tired, decreased energy 1  2  Change in appetite 0 0  Feeling bad or failure about yourself  0 0  Trouble concentrating 0 1  Moving slowly or fidgety/restless 0 0  Suicidal thoughts 0 0  PHQ-9 Score 2 3      Review of Systems  Gastrointestinal:       Bowel Control Problems  All other systems reviewed and are negative.      Objective:   Physical Exam  Constitutional: He is oriented to person, place, and time.  HENT:  Eyes: EOM are normal.  Neck: Normal range of motion. Neck supple. No thyromegaly present.  Cardiovascular: Normal rate and regular rhythm. no murmurs, rubs  Respiratory: Effort normal and breath sounds normal. No respiratory distress.  GI: Soft. Bowel sounds are normal. He exhibits no distension.  Neurological: He is alert and oriented to person, place, and time. He has ongoing word finding deficits, but is generally able to  convey his thought and intentions with extra time.  Speech is intelligible. Mild right central 7. He is oriented to person place, date. RUE: 4/5 prox to distal. Decreased FMC RUE but improved.  RLE: 4 hf, 4/5 ke, adf/apf. LLE: 4+/5. Decreased proprioception in both legs (chronic). Ambulates with mild posterior lean and slight shuffle.  Skin: Skin is warm and dry.  Psychiatric: He has a normal mood and affect. His behavior is normal    Assessment/Plan:  1. Functional deficits secondary to left basal ganglia infarct  -has made nice improvements  -encouraged continued use of his right hand for fine motor tasks  -discussed language exercises for at home  -continue HEP  2. History of multiple myeloma with thoracic spinal metastasis.  -mgt per Dr. Earlie Server  9. Aspiration pneumonia/dysphagia:  -last MBS with penetration of thins in October  -will re-order for this month given occasional cough and per SLP recs in October  - Lungs remain clear  10. BPH. . Continue Flomax    Follow up in 3  months. Fifteen minutes of face to face patient care time were spent during this visit. All questions were encouraged and answered.

## 2015-07-10 NOTE — Patient Instructions (Signed)
YOU MAY DRIVE [id number] MINUTES IN ONE DIRECTION, DURING THE DAY TIME DURING OFF-PEAK TRAFFIC TIMES.   PLEASE CALL ME WITH ANY PROBLEMS OR QUESTIONS [id number]). HAVE A HAPPY HOLIDAY SEASON!!!

## 2015-07-11 ENCOUNTER — Other Ambulatory Visit (HOSPITAL_BASED_OUTPATIENT_CLINIC_OR_DEPARTMENT_OTHER): Payer: Medicare Other

## 2015-07-11 ENCOUNTER — Other Ambulatory Visit: Payer: Self-pay | Admitting: Internal Medicine

## 2015-07-11 ENCOUNTER — Ambulatory Visit (HOSPITAL_BASED_OUTPATIENT_CLINIC_OR_DEPARTMENT_OTHER): Payer: Medicare Other

## 2015-07-11 VITALS — BP 110/54 | HR 69 | Temp 98.4°F | Resp 16

## 2015-07-11 DIAGNOSIS — Z5112 Encounter for antineoplastic immunotherapy: Secondary | ICD-10-CM

## 2015-07-11 DIAGNOSIS — C9001 Multiple myeloma in remission: Secondary | ICD-10-CM

## 2015-07-11 DIAGNOSIS — C9000 Multiple myeloma not having achieved remission: Secondary | ICD-10-CM

## 2015-07-11 LAB — CBC WITH DIFFERENTIAL/PLATELET
BASO%: 0.4 % (ref 0.0–2.0)
Basophils Absolute: 0 10*3/uL (ref 0.0–0.1)
EOS%: 9.3 % — ABNORMAL HIGH (ref 0.0–7.0)
Eosinophils Absolute: 0.3 10*3/uL (ref 0.0–0.5)
HEMATOCRIT: 37.1 % — AB (ref 38.4–49.9)
HEMOGLOBIN: 12.4 g/dL — AB (ref 13.0–17.1)
LYMPH#: 0.4 10*3/uL — AB (ref 0.9–3.3)
LYMPH%: 13.7 % — ABNORMAL LOW (ref 14.0–49.0)
MCH: 32.5 pg (ref 27.2–33.4)
MCHC: 33.3 g/dL (ref 32.0–36.0)
MCV: 97.5 fL (ref 79.3–98.0)
MONO#: 0.5 10*3/uL (ref 0.1–0.9)
MONO%: 15.5 % — ABNORMAL HIGH (ref 0.0–14.0)
NEUT%: 61.1 % (ref 39.0–75.0)
NEUTROS ABS: 1.9 10*3/uL (ref 1.5–6.5)
PLATELETS: 62 10*3/uL — AB (ref 140–400)
RBC: 3.81 10*6/uL — ABNORMAL LOW (ref 4.20–5.82)
RDW: 16.2 % — ABNORMAL HIGH (ref 11.0–14.6)
WBC: 3.1 10*3/uL — AB (ref 4.0–10.3)

## 2015-07-11 LAB — COMPREHENSIVE METABOLIC PANEL
ALBUMIN: 3.7 g/dL (ref 3.5–5.0)
ALK PHOS: 59 U/L (ref 40–150)
ALT: 15 U/L (ref 0–55)
AST: 16 U/L (ref 5–34)
Anion Gap: 10 mEq/L (ref 3–11)
BUN: 15.2 mg/dL (ref 7.0–26.0)
CALCIUM: 9.1 mg/dL (ref 8.4–10.4)
CHLORIDE: 102 meq/L (ref 98–109)
CO2: 29 mEq/L (ref 22–29)
CREATININE: 1 mg/dL (ref 0.7–1.3)
EGFR: 69 mL/min/{1.73_m2} — ABNORMAL LOW (ref >90)
Glucose: 87 mg/dl (ref 70–140)
Potassium: 3.7 mEq/L (ref 3.5–5.1)
Sodium: 141 mEq/L (ref 136–145)
Total Bilirubin: 0.9 mg/dL (ref 0.20–1.20)
Total Protein: 6.1 g/dL — ABNORMAL LOW (ref 6.4–8.3)

## 2015-07-11 LAB — LACTATE DEHYDROGENASE: LDH: 181 U/L (ref 125–245)

## 2015-07-11 MED ORDER — SODIUM CHLORIDE 0.9 % IV SOLN
INTRAVENOUS | Status: DC
  Administered 2015-07-11: 10:00:00 via INTRAVENOUS

## 2015-07-11 MED ORDER — ZOLEDRONIC ACID 4 MG/100ML IV SOLN
4.0000 mg | Freq: Once | INTRAVENOUS | Status: AC
Start: 2015-07-11 — End: 2015-07-11
  Administered 2015-07-11: 4 mg via INTRAVENOUS
  Filled 2015-07-11: qty 100

## 2015-07-11 MED ORDER — BORTEZOMIB CHEMO SQ INJECTION 3.5 MG (2.5MG/ML)
1.3000 mg/m2 | Freq: Once | INTRAMUSCULAR | Status: AC
  Administered 2015-07-11: 2.5 mg via SUBCUTANEOUS
  Filled 2015-07-11: qty 2.5

## 2015-07-11 MED ORDER — ONDANSETRON 8 MG PO TBDP
8.0000 mg | ORAL_TABLET | Freq: Once | ORAL | Status: AC
  Administered 2015-07-11: 8 mg via ORAL
  Filled 2015-07-11: qty 1

## 2015-07-11 NOTE — Progress Notes (Signed)
Okay to treat today with plt 16 per Dr. Julien Nordmann.

## 2015-07-11 NOTE — Patient Instructions (Signed)
[facility] [facility] Discharge Instructions for Patients Receiving Chemotherapy  Today you received the following chemotherapy agents Zometa/Velcade.  To help prevent nausea and vomiting after your treatment, we encourage you to take your nausea medication as directed.   If you develop nausea and vomiting that is not controlled by your nausea medication, call the clinic.   BELOW ARE SYMPTOMS THAT SHOULD BE REPORTED IMMEDIATELY:  *FEVER GREATER THAN 100.5 F  *CHILLS WITH OR WITHOUT FEVER  NAUSEA AND VOMITING THAT IS NOT CONTROLLED WITH YOUR NAUSEA MEDICATION  *UNUSUAL SHORTNESS OF BREATH  *UNUSUAL BRUISING OR BLEEDING  TENDERNESS IN MOUTH AND THROAT WITH OR WITHOUT PRESENCE OF ULCERS  *URINARY PROBLEMS  *BOWEL PROBLEMS  UNUSUAL RASH Items with * indicate a potential emergency and should be followed up as soon as possible.  Feel free to call the clinic you have any questions or concerns. The clinic phone number is [phone].  Please show the [place] at check-in to the Emergency Department and triage nurse.

## 2015-07-11 NOTE — Progress Notes (Signed)
VS were documented on the wrong patient. Immediately corrected.

## 2015-07-13 LAB — KAPPA/LAMBDA LIGHT CHAINS
KAPPA LAMBDA RATIO: 1.76 — AB (ref 0.26–1.65)
Kappa free light chain: 2.47 mg/dL — ABNORMAL HIGH (ref 0.33–1.94)
LAMBDA FREE LGHT CHN: 1.4 mg/dL (ref 0.57–2.63)

## 2015-07-13 LAB — BETA 2 MICROGLOBULIN, SERUM: Beta-2 Microglobulin: 3.14 mg/L — ABNORMAL HIGH (ref <=2.51)

## 2015-07-13 LAB — IGG, IGA, IGM
IGG (IMMUNOGLOBIN G), SERUM: 404 mg/dL — AB (ref 650–1600)
IGM, SERUM: 20 mg/dL — AB (ref 41–251)
IgA: 64 mg/dL — ABNORMAL LOW (ref 68–379)

## 2015-07-16 ENCOUNTER — Other Ambulatory Visit (HOSPITAL_COMMUNITY): Payer: Self-pay | Admitting: Physical Medicine & Rehabilitation

## 2015-07-16 DIAGNOSIS — C9000 Multiple myeloma not having achieved remission: Secondary | ICD-10-CM | POA: Diagnosis not present

## 2015-07-16 DIAGNOSIS — K219 Gastro-esophageal reflux disease without esophagitis: Secondary | ICD-10-CM | POA: Diagnosis not present

## 2015-07-16 DIAGNOSIS — R1310 Dysphagia, unspecified: Secondary | ICD-10-CM

## 2015-07-16 DIAGNOSIS — L0390 Cellulitis, unspecified: Secondary | ICD-10-CM | POA: Diagnosis not present

## 2015-07-16 DIAGNOSIS — E039 Hypothyroidism, unspecified: Secondary | ICD-10-CM | POA: Diagnosis not present

## 2015-07-16 DIAGNOSIS — M6281 Muscle weakness (generalized): Secondary | ICD-10-CM | POA: Diagnosis not present

## 2015-07-16 DIAGNOSIS — I6340 Cerebral infarction due to embolism of unspecified cerebral artery: Secondary | ICD-10-CM | POA: Diagnosis not present

## 2015-07-18 ENCOUNTER — Ambulatory Visit: Payer: Medicare Other

## 2015-07-18 ENCOUNTER — Other Ambulatory Visit (HOSPITAL_BASED_OUTPATIENT_CLINIC_OR_DEPARTMENT_OTHER): Payer: Medicare Other

## 2015-07-18 ENCOUNTER — Ambulatory Visit (HOSPITAL_BASED_OUTPATIENT_CLINIC_OR_DEPARTMENT_OTHER): Payer: Medicare Other | Admitting: Internal Medicine

## 2015-07-18 ENCOUNTER — Other Ambulatory Visit: Payer: Self-pay | Admitting: *Deleted

## 2015-07-18 ENCOUNTER — Telehealth: Payer: Self-pay | Admitting: Internal Medicine

## 2015-07-18 ENCOUNTER — Encounter: Payer: Self-pay | Admitting: Internal Medicine

## 2015-07-18 VITALS — BP 157/55 | HR 76 | Temp 98.4°F | Resp 18 | Ht 69.0 in | Wt 159.0 lb

## 2015-07-18 DIAGNOSIS — C419 Malignant neoplasm of bone and articular cartilage, unspecified: Secondary | ICD-10-CM

## 2015-07-18 DIAGNOSIS — C9000 Multiple myeloma not having achieved remission: Secondary | ICD-10-CM

## 2015-07-18 LAB — CBC WITH DIFFERENTIAL/PLATELET
BASO%: 0.5 % (ref 0.0–2.0)
BASOS ABS: 0 10*3/uL (ref 0.0–0.1)
EOS%: 15.1 % — AB (ref 0.0–7.0)
Eosinophils Absolute: 0.4 10*3/uL (ref 0.0–0.5)
HEMATOCRIT: 36.6 % — AB (ref 38.4–49.9)
HEMOGLOBIN: 11.8 g/dL — AB (ref 13.0–17.1)
LYMPH#: 0.4 10*3/uL — AB (ref 0.9–3.3)
LYMPH%: 15.4 % (ref 14.0–49.0)
MCH: 31.7 pg (ref 27.2–33.4)
MCHC: 32.3 g/dL (ref 32.0–36.0)
MCV: 98 fL (ref 79.3–98.0)
MONO#: 0.4 10*3/uL (ref 0.1–0.9)
MONO%: 14.3 % — AB (ref 0.0–14.0)
NEUT%: 54.7 % (ref 39.0–75.0)
NEUTROS ABS: 1.4 10*3/uL — AB (ref 1.5–6.5)
Platelets: 86 10*3/uL — ABNORMAL LOW (ref 140–400)
RBC: 3.73 10*6/uL — ABNORMAL LOW (ref 4.20–5.82)
RDW: 16.4 % — AB (ref 11.0–14.6)
WBC: 2.6 10*3/uL — AB (ref 4.0–10.3)

## 2015-07-18 LAB — COMPREHENSIVE METABOLIC PANEL
ALBUMIN: 3.4 g/dL — AB (ref 3.5–5.0)
ALK PHOS: 59 U/L (ref 40–150)
ALT: 17 U/L (ref 0–55)
AST: 16 U/L (ref 5–34)
Anion Gap: 8 mEq/L (ref 3–11)
BUN: 15.4 mg/dL (ref 7.0–26.0)
CALCIUM: 8.6 mg/dL (ref 8.4–10.4)
CO2: 29 mEq/L (ref 22–29)
CREATININE: 1 mg/dL (ref 0.7–1.3)
Chloride: 103 mEq/L (ref 98–109)
EGFR: 68 mL/min/{1.73_m2} — ABNORMAL LOW (ref >90)
GLUCOSE: 83 mg/dL (ref 70–140)
Potassium: 3.9 mEq/L (ref 3.5–5.1)
SODIUM: 140 meq/L (ref 136–145)
TOTAL PROTEIN: 5.6 g/dL — AB (ref 6.4–8.3)
Total Bilirubin: 0.73 mg/dL (ref 0.20–1.20)

## 2015-07-18 NOTE — Telephone Encounter (Signed)
Gave and printed appt sched and avs fo rpt for Jan °

## 2015-07-18 NOTE — Progress Notes (Signed)
[place] [phone]   Fax:([phone]) [place] [address] [employer] [address] [place] [address]  DIAGNOSIS: Multiple myeloma diagnosed in November 2015  PRIOR THERAPY:  1) Status post Thoracic five laminectomy for epidural tumor resection, Thoracic 4-7 posterior lateral arthrodesis,  segmental pedicle screw fixation T4-T7 Gl[facility]us instrumentation under the care of Dr. Christella Noa on 06/18/2014. 2) Systemic chemotherapy with Velcade 1.3 MG/M2 subcutaneously weekly in addition to Decadron 40 mg by mouth on weekly basis. Status post 11 cycles. 3) palliative radiotherapy to the right proximal humerus, shoulder and scapula under the care of Dr. Lisbeth Renshaw completed on 12/12/2014.  CURRENT THERAPY:  1) Systemic chemotherapy with Velcade 1.3 MG/M2 subcutaneously weekly, Decadron 40 mg by mouth weekly in addition to Revlimid 25 MG by mouth daily for 21 days every 4 weeks, status post 10 cycles. He is expected to start cycle #11 next week 2) Zometa 4 mg IV every month. First dose 11/01/2014.  INTERVAL HISTORY: Jeremiah Owens 81 y.o. male returns to the clinic today for follow-up visit accompanied by his wife. The patient is currently on treatment with weekly subcutaneous Velcade, Revlimid and Decadron status post 10 cycle and tolerating the treatment fairly well. He was recently treated for cellulitis of the lower extremity by his primary care physician with Keflex. She still have some residual cellulitis in the lower extremities. He denied having any significant nausea or vomiting, no fever or chills. He denied having any significant weight loss or night sweats. He denied having any significant chest pain, shortness of breath, cough or hemoptysis. He is here today for evaluation and repeat myeloma panel.  MEDICAL HISTORY: Past Medical History  Diagnosis Date  . Compression fracture   . Allergy   . Anxiety   . Thyroid disease   . S/P  radiation therapy 08/21/14-09/04/14    T4-6 25Gy/86f  . S/P radiation therapy 11/29/14-12/12/14    rt prox humerus/shoulder/scapula 25Gy/150f . Encounter for antineoplastic chemotherapy 01/31/2015  . Melanoma ([facility]  . Bone cancer ([facility]CH[facility]    t_spine T-5  . Prostate cancer ([facility] [place]  . Skin cancer 1994    melanoma  right neck   . Multiple myeloma ([facility]  . Hearing loss   . [facility]    ALLERGIES:  is allergic to iodine.  MEDICATIONS:  Current Outpatient Prescriptions  Medication Sig Dispense Refill  . acyclovir (ZOVIRAX) 400 MG tablet TAKE 1 TABLET BY MOUTH TWICE A DAY 60 tablet 2  . aspirin 81 MG EC tablet Take 81 mg by mouth daily.  0  . Calcium Carbonate-Vitamin D (CALTRATE 600+D PO) Take 600 mg by mouth 2 (two) times daily.    . cholecalciferol (VITAMIN D) 1000 UNITS tablet Take 1,000 Units by mouth at bedtime.     . Marland Kitchenexamethasone (DECADRON) 4 MG tablet TAKE 10 TABS EVERY WEEK, START WITH CHEMO 40 tablet 4  . furosemide (LASIX) 20 MG tablet Take 20 mg by mouth 2 (two) times daily.    . Marland KitchenYDROcodone-acetaminophen (NORCO) 5-325 MG per tablet Take 1 tablet by mouth every 6 (six) hours as needed for moderate pain. 60 tablet 0  . KLOR-CON 10 10 MEQ tablet Take 10 mEq by mouth daily.    . Marland Kitchenenalidomide (REVLIMID) 25 MG capsule Take 1 capsule (25 mg total) by mouth daily. Take one capsule ( 25 mg) by mouth for 21 days every 4 weeks-05/10/15 Authorization number=  [id number]  male 21 capsule 0  . levothyroxine (SYNTHROID, LEVOTHROID) 50 MCG tablet Take 50 mcg by mouth daily.  1  . methocarbamol (ROBAXIN) 500 MG tablet TAKE 1 TABLET (500 MG TOTAL) BY MOUTH EVERY 6 (SIX) HOURS AS NEEDED FOR MUSCLE SPASMS. 180 tablet 4  . Multiple Vitamins-Minerals (CENTRUM SILVER PO) Take 1 tablet by mouth daily.    Marland Kitchen omeprazole (PRILOSEC) 40 MG capsule Take 40 mg by mouth daily.    . prochlorperazine (COMPAZINE) 10 MG tablet Take 10 mg by mouth daily.  0  . ranitidine (ZANTAC) 150 MG tablet Take 150 mg  by mouth daily as needed for heartburn.   11  . sennosides-docusate sodium (SENOKOT-S) 8.6-50 MG tablet Take 1-2 tablets by mouth 2 (two) times daily as needed for constipation. Takes 1-2 tabs daily    . tamsulosin (FLOMAX) 0.4 MG CAPS capsule Take 1 capsule (0.4 mg total) by mouth at bedtime. 30 capsule 3  . warfarin (COUMADIN) 2 MG tablet Take 1 tablet (2 mg total) by mouth daily. 90 tablet 4   No current facility-administered medications for this visit.    SURGICAL HISTORY:  Past Surgical History  Procedure Laterality Date  . Hernia repair    . Posterior cervical fusion/foraminotomy N/A 06/17/2014    Procedure: Thoracic five laminectomy for epidural tumor resection Thoracic 4-7 posterior lateral arthrodesis, segmental pedicle screw fixation.;  Surgeon: Ashok Pall, MD;  Location: [facility];  Service: Neurosurgery;  Laterality: N/A;    REVIEW OF SYSTEMS:  Constitutional: positive for fatigue Eyes: negative Ears, nose, mouth, throat, and face: negative Respiratory: negative Cardiovascular: negative Gastrointestinal: negative Genitourinary:negative Integument/breast: negative Hematologic/lymphatic: negative Musculoskeletal:positive for muscle weakness Neurological: negative Behavioral/Psych: negative Endocrine: negative Allergic/Immunologic: negative   PHYSICAL EXAMINATION: General appearance: alert, cooperative, fatigued and no distress Head: Normocephalic, without obvious abnormality, atraumatic Neck: no adenopathy, no JVD, supple, symmetrical, trachea midline and thyroid not enlarged, symmetric, no tenderness/mass/nodules Lymph nodes: Cervical, supraclavicular, and axillary nodes normal. Resp: clear to auscultation bilaterally Back: symmetric, no curvature. ROM normal. No CVA tenderness. Cardio: regular rate and rhythm, S1, S2 normal, no murmur, click, rub or gallop GI: soft, non-tender; bowel sounds normal; no masses,  no organomegaly Extremities: edema 2+ edema  bilaterally with erythema on the legs Neurologic: Alert and oriented X 3, normal strength and tone. Normal symmetric reflexes. Normal coordination and gait Motor: grossly normal Weakness in the left foot  ECOG PERFORMANCE STATUS: 1 - Symptomatic but completely ambulatory  Blood pressure 157/55, pulse 76, temperature 98.4 F (36.9 C), temperature source Oral, resp. rate 18, height '5\' 9"'  (1.753 m), weight 159 lb (72.122 kg), SpO2 100 %.  LABORATORY DATA: Lab Results  Component Value Date   WBC 2.6* 07/18/2015   HGB 11.8* 07/18/2015   HCT 36.6* 07/18/2015   MCV 98.0 07/18/2015   PLT 86* 07/18/2015      Chemistry      Component Value Date/Time   NA 140 07/18/2015 1050   NA 134* 02/21/2015 0626   K 3.9 07/18/2015 1050   K 4.1 02/21/2015 0626   CL 101 02/21/2015 0626   CO2 29 07/18/2015 1050   CO2 26 02/21/2015 0626   BUN 15.4 07/18/2015 1050   BUN 12 02/21/2015 0626   CREATININE 1.0 07/18/2015 1050   CREATININE 0.84 02/21/2015 0626      Component Value Date/Time   CALCIUM 8.6 07/18/2015 1050   CALCIUM 7.9* 02/21/2015 0626   ALKPHOS 59 07/18/2015 1050   ALKPHOS 48 02/21/2015 0626   AST  16 07/18/2015 1050   AST 17 02/21/2015 0626   ALT 17 07/18/2015 1050   ALT 25 02/21/2015 0626   BILITOT 0.73 07/18/2015 1050   BILITOT 1.1 02/21/2015 0626       RADIOGRAPHIC STUDIES: No results found.  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 He completed treatment with subcutaneous Velcade and Decadron status post 11 weekly doses. This was discontinued secondary to disease progression. The patient was started on treatment with weekly subcutaneous Velcade, Revlimid for 21 days every 4 weeks in addition to Decadron 40 mg weekly status post 10 cycle and he is tolerating it fairly well.  His myeloma panel showed no  evidence for disease progression. I discussed the lab results with the patient today. I recommended for the patient to continue his treatment today as a scheduled next week.Marland Kitchen He would come back for follow-up visit in 4 weeks for reevaluation He will proceed with the Zometa infusion as scheduled. He was advised to call immediately if he has any concerning symptoms in the interval. The patient voices understanding of current disease status and treatment options and is in agreement with the current care plan.  All questions were answered. The patient knows to call the clinic with any problems, questions or concerns. We can certainly see the patient much sooner if necessary.  Disclaimer: This note was dictated with voice recognition software. Similar sounding words can inadvertently be transcribed and may not be corrected upon review.

## 2015-07-19 ENCOUNTER — Other Ambulatory Visit: Payer: Self-pay | Admitting: *Deleted

## 2015-07-19 DIAGNOSIS — C9000 Multiple myeloma not having achieved remission: Secondary | ICD-10-CM

## 2015-07-19 MED ORDER — LENALIDOMIDE 25 MG PO CAPS: 25.0000 mg | ORAL_CAPSULE | Freq: Every day | ORAL | Status: DC

## 2015-07-20 ENCOUNTER — Other Ambulatory Visit: Payer: Self-pay

## 2015-07-20 DIAGNOSIS — C9000 Multiple myeloma not having achieved remission: Secondary | ICD-10-CM

## 2015-07-20 MED ORDER — ACYCLOVIR 400 MG PO TABS: 400.0000 mg | ORAL_TABLET | Freq: Two times a day (BID) | ORAL | Status: DC

## 2015-07-20 NOTE — Telephone Encounter (Signed)
Wife called stating that per Dr Julien Nordmann pt is to restart his revlimid Tuesday and [facility] has not received the Rx yet. Also pt's insurance company is having them change pharmacy to [facility] and they need acyclovir transferred. Acyclovir ordered. Revlimid needs Dr Worthy Flank signature.

## 2015-07-21 NOTE — Telephone Encounter (Signed)
Will take care of it on Monday. 

## 2015-07-24 ENCOUNTER — Other Ambulatory Visit: Payer: Self-pay | Admitting: *Deleted

## 2015-07-24 DIAGNOSIS — C9000 Multiple myeloma not having achieved remission: Secondary | ICD-10-CM

## 2015-07-24 NOTE — Telephone Encounter (Signed)
Pt Rx for Revlimid faxed on 12/16, Fax confirmed/recieved by [facility]  at 10:09am

## 2015-07-25 ENCOUNTER — Other Ambulatory Visit (HOSPITAL_BASED_OUTPATIENT_CLINIC_OR_DEPARTMENT_OTHER): Payer: Medicare Other

## 2015-07-25 ENCOUNTER — Ambulatory Visit (HOSPITAL_COMMUNITY)
Admission: RE | Admit: 2015-07-25 | Discharge: 2015-07-25 | Disposition: A | Discharge status: Home / Self Care | Payer: Medicare Other | Source: Ambulatory Visit | Attending: Physical Medicine & Rehabilitation | Admitting: Physical Medicine & Rehabilitation

## 2015-07-25 ENCOUNTER — Ambulatory Visit (HOSPITAL_BASED_OUTPATIENT_CLINIC_OR_DEPARTMENT_OTHER): Payer: Medicare Other

## 2015-07-25 VITALS — BP 115/53 | HR 77 | Temp 97.6°F | Resp 17

## 2015-07-25 DIAGNOSIS — Z5112 Encounter for antineoplastic immunotherapy: Secondary | ICD-10-CM

## 2015-07-25 DIAGNOSIS — R1310 Dysphagia, unspecified: Secondary | ICD-10-CM

## 2015-07-25 DIAGNOSIS — C9000 Multiple myeloma not having achieved remission: Secondary | ICD-10-CM | POA: Diagnosis present

## 2015-07-25 DIAGNOSIS — Z8673 Personal history of transient ischemic attack (TIA), and cerebral infarction without residual deficits: Secondary | ICD-10-CM | POA: Insufficient documentation

## 2015-07-25 DIAGNOSIS — T17308A Unspecified foreign body in larynx causing other injury, initial encounter: Secondary | ICD-10-CM | POA: Diagnosis not present

## 2015-07-25 LAB — CBC WITH DIFFERENTIAL/PLATELET
BASO%: 0.3 % (ref 0.0–2.0)
Basophils Absolute: 0 10*3/uL (ref 0.0–0.1)
EOS ABS: 0 10*3/uL (ref 0.0–0.5)
EOS%: 0.7 % (ref 0.0–7.0)
HEMATOCRIT: 38.6 % (ref 38.4–49.9)
HGB: 13 g/dL (ref 13.0–17.1)
LYMPH#: 0.4 10*3/uL — AB (ref 0.9–3.3)
LYMPH%: 11.9 % — ABNORMAL LOW (ref 14.0–49.0)
MCH: 33 pg (ref 27.2–33.4)
MCHC: 33.7 g/dL (ref 32.0–36.0)
MCV: 98 fL (ref 79.3–98.0)
MONO#: 0.1 10*3/uL (ref 0.1–0.9)
MONO%: 2 % (ref 0.0–14.0)
NEUT%: 85.1 % — AB (ref 39.0–75.0)
NEUTROS ABS: 2.5 10*3/uL (ref 1.5–6.5)
PLATELETS: 127 10*3/uL — AB (ref 140–400)
RBC: 3.94 10*6/uL — ABNORMAL LOW (ref 4.20–5.82)
RDW: 15.6 % — ABNORMAL HIGH (ref 11.0–14.6)
WBC: 2.9 10*3/uL — ABNORMAL LOW (ref 4.0–10.3)

## 2015-07-25 LAB — COMPREHENSIVE METABOLIC PANEL
ALBUMIN: 3.8 g/dL (ref 3.5–5.0)
ALK PHOS: 61 U/L (ref 40–150)
ALT: 18 U/L (ref 0–55)
ANION GAP: 8 meq/L (ref 3–11)
AST: 18 U/L (ref 5–34)
BUN: 17.3 mg/dL (ref 7.0–26.0)
CALCIUM: 9.1 mg/dL (ref 8.4–10.4)
CHLORIDE: 103 meq/L (ref 98–109)
CO2: 28 mEq/L (ref 22–29)
Creatinine: 1 mg/dL (ref 0.7–1.3)
EGFR: 73 mL/min/{1.73_m2} — AB (ref >90)
Glucose: 97 mg/dl (ref 70–140)
POTASSIUM: 4.3 meq/L (ref 3.5–5.1)
Sodium: 139 mEq/L (ref 136–145)
Total Bilirubin: 0.69 mg/dL (ref 0.20–1.20)
Total Protein: 6.3 g/dL — ABNORMAL LOW (ref 6.4–8.3)

## 2015-07-25 MED ORDER — BORTEZOMIB CHEMO SQ INJECTION 3.5 MG (2.5MG/ML)
1.3000 mg/m2 | Freq: Once | INTRAMUSCULAR | Status: AC
  Administered 2015-07-25: 2.5 mg via SUBCUTANEOUS
  Filled 2015-07-25: qty 2.5

## 2015-07-25 MED ORDER — ONDANSETRON 8 MG PO TBDP
8.0000 mg | ORAL_TABLET | Freq: Once | ORAL | Status: AC
  Administered 2015-07-25: 8 mg via ORAL
  Filled 2015-07-25: qty 1

## 2015-07-25 NOTE — Progress Notes (Signed)
Pt reports ongoing diarrhea, up to 3 loose stools per day. States he takes imodium and symptoms improve, last took imodium this morning 12/21.  Denies blood in stool. Denies N/V.  States finished course of antibiotics for cellulitis Monday 12/19.  States redness and warmth to bilateral legs improved. Dr. Julien Nordmann notified, okay to proceed with treatment.

## 2015-07-25 NOTE — Patient Instructions (Signed)
[facility] Discharge Instructions for Patients Receiving Chemotherapy  Today you received the following chemotherapy agents Velcade. To help prevent nausea and vomiting after your treatment, we encourage you to take your nausea medication as directed.  If you develop nausea and vomiting that is not controlled by your nausea medication, call the clinic.   BELOW ARE SYMPTOMS THAT SHOULD BE REPORTED IMMEDIATELY:  *FEVER GREATER THAN 100.5 F  *CHILLS WITH OR WITHOUT FEVER  NAUSEA AND VOMITING THAT IS NOT CONTROLLED WITH YOUR NAUSEA MEDICATION  *UNUSUAL SHORTNESS OF BREATH  *UNUSUAL BRUISING OR BLEEDING  TENDERNESS IN MOUTH AND THROAT WITH OR WITHOUT PRESENCE OF ULCERS  *URINARY PROBLEMS  *BOWEL PROBLEMS  UNUSUAL RASH Items with * indicate a potential emergency and should be followed up as soon as possible.  Feel free to call the clinic you have any questions or concerns. The clinic phone number is [phone].  Please show the CHEMO ALERT CARD at check-in to the Emergency Department and triage nurse.    

## 2015-08-01 ENCOUNTER — Other Ambulatory Visit: Payer: Self-pay | Admitting: *Deleted

## 2015-08-01 ENCOUNTER — Other Ambulatory Visit (HOSPITAL_BASED_OUTPATIENT_CLINIC_OR_DEPARTMENT_OTHER): Payer: Medicare Other

## 2015-08-01 ENCOUNTER — Ambulatory Visit (HOSPITAL_BASED_OUTPATIENT_CLINIC_OR_DEPARTMENT_OTHER): Payer: Medicare Other

## 2015-08-01 VITALS — BP 126/52 | HR 78 | Temp 98.1°F | Resp 16

## 2015-08-01 DIAGNOSIS — Z5112 Encounter for antineoplastic immunotherapy: Secondary | ICD-10-CM

## 2015-08-01 DIAGNOSIS — C9000 Multiple myeloma not having achieved remission: Secondary | ICD-10-CM

## 2015-08-01 DIAGNOSIS — C419 Malignant neoplasm of bone and articular cartilage, unspecified: Secondary | ICD-10-CM

## 2015-08-01 LAB — COMPREHENSIVE METABOLIC PANEL
ALK PHOS: 55 U/L (ref 40–150)
ALT: 20 U/L (ref 0–55)
AST: 19 U/L (ref 5–34)
Albumin: 3.5 g/dL (ref 3.5–5.0)
Anion Gap: 8 mEq/L (ref 3–11)
BUN: 12.7 mg/dL (ref 7.0–26.0)
CHLORIDE: 102 meq/L (ref 98–109)
CO2: 31 mEq/L — ABNORMAL HIGH (ref 22–29)
Calcium: 8.6 mg/dL (ref 8.4–10.4)
Creatinine: 1 mg/dL (ref 0.7–1.3)
EGFR: 73 mL/min/{1.73_m2} — AB (ref >90)
GLUCOSE: 86 mg/dL (ref 70–140)
POTASSIUM: 3.6 meq/L (ref 3.5–5.1)
SODIUM: 141 meq/L (ref 136–145)
Total Bilirubin: 0.83 mg/dL (ref 0.20–1.20)
Total Protein: 5.8 g/dL — ABNORMAL LOW (ref 6.4–8.3)

## 2015-08-01 LAB — CBC WITH DIFFERENTIAL/PLATELET
BASO%: 0.4 % (ref 0.0–2.0)
BASOS ABS: 0 10*3/uL (ref 0.0–0.1)
EOS ABS: 0.2 10*3/uL (ref 0.0–0.5)
EOS%: 7.1 % — AB (ref 0.0–7.0)
HCT: 37.1 % — ABNORMAL LOW (ref 38.4–49.9)
HGB: 12.3 g/dL — ABNORMAL LOW (ref 13.0–17.1)
LYMPH%: 13.9 % — AB (ref 14.0–49.0)
MCH: 32.5 pg (ref 27.2–33.4)
MCHC: 33.2 g/dL (ref 32.0–36.0)
MCV: 97.9 fL (ref 79.3–98.0)
MONO#: 0.3 10*3/uL (ref 0.1–0.9)
MONO%: 9.7 % (ref 0.0–14.0)
NEUT#: 1.8 10*3/uL (ref 1.5–6.5)
NEUT%: 68.9 % (ref 39.0–75.0)
Platelets: 83 10*3/uL — ABNORMAL LOW (ref 140–400)
RBC: 3.79 10*6/uL — AB (ref 4.20–5.82)
RDW: 15.8 % — AB (ref 11.0–14.6)
WBC: 2.7 10*3/uL — ABNORMAL LOW (ref 4.0–10.3)
lymph#: 0.4 10*3/uL — ABNORMAL LOW (ref 0.9–3.3)

## 2015-08-01 MED ORDER — BORTEZOMIB CHEMO SQ INJECTION 3.5 MG (2.5MG/ML)
1.3000 mg/m2 | Freq: Once | INTRAMUSCULAR | Status: AC
  Administered 2015-08-01: 2.5 mg via SUBCUTANEOUS
  Filled 2015-08-01: qty 2.5

## 2015-08-01 MED ORDER — ONDANSETRON 8 MG PO TBDP
8.0000 mg | ORAL_TABLET | Freq: Once | ORAL | Status: AC
  Administered 2015-08-01: 8 mg via ORAL
  Filled 2015-08-01: qty 1

## 2015-08-01 NOTE — Patient Instructions (Signed)
[facility] Discharge Instructions for Patients Receiving Chemotherapy  Today you received the following chemotherapy agent: Velcade.  To help prevent nausea and vomiting after your treatment, we encourage you to take your nausea medication as directed   If you develop nausea and vomiting that is not controlled by your nausea medication, call the clinic.   BELOW ARE SYMPTOMS THAT SHOULD BE REPORTED IMMEDIATELY:  *FEVER GREATER THAN 100.5 F  *CHILLS WITH OR WITHOUT FEVER  NAUSEA AND VOMITING THAT IS NOT CONTROLLED WITH YOUR NAUSEA MEDICATION  *UNUSUAL SHORTNESS OF BREATH  *UNUSUAL BRUISING OR BLEEDING  TENDERNESS IN MOUTH AND THROAT WITH OR WITHOUT PRESENCE OF ULCERS  *URINARY PROBLEMS  *BOWEL PROBLEMS  UNUSUAL RASH Items with * indicate a potential emergency and should be followed up as soon as possible.  Feel free to call the clinic you have any questions or concerns. The clinic phone number is [phone].  Please show the CHEMO ALERT CARD at check-in to the Emergency Department and triage nurse.   

## 2015-08-01 NOTE — Progress Notes (Signed)
Ok to treat today with platelets of 83K/ VO given and read back Dr. Julien Nordmann

## 2015-08-03 ENCOUNTER — Other Ambulatory Visit: Payer: Self-pay | Admitting: *Deleted

## 2015-08-03 DIAGNOSIS — C419 Malignant neoplasm of bone and articular cartilage, unspecified: Secondary | ICD-10-CM

## 2015-08-08 ENCOUNTER — Ambulatory Visit (HOSPITAL_BASED_OUTPATIENT_CLINIC_OR_DEPARTMENT_OTHER): Payer: Medicare Other | Admitting: Physician Assistant

## 2015-08-08 ENCOUNTER — Other Ambulatory Visit (HOSPITAL_BASED_OUTPATIENT_CLINIC_OR_DEPARTMENT_OTHER): Payer: Medicare Other

## 2015-08-08 ENCOUNTER — Ambulatory Visit (HOSPITAL_BASED_OUTPATIENT_CLINIC_OR_DEPARTMENT_OTHER): Payer: Medicare Other

## 2015-08-08 ENCOUNTER — Other Ambulatory Visit: Payer: Self-pay | Admitting: Medical Oncology

## 2015-08-08 VITALS — BP 112/58 | HR 77 | Temp 98.4°F | Resp 18 | Ht 69.0 in | Wt 158.3 lb

## 2015-08-08 DIAGNOSIS — C9001 Multiple myeloma in remission: Secondary | ICD-10-CM

## 2015-08-08 DIAGNOSIS — C419 Malignant neoplasm of bone and articular cartilage, unspecified: Secondary | ICD-10-CM

## 2015-08-08 DIAGNOSIS — Z5111 Encounter for antineoplastic chemotherapy: Secondary | ICD-10-CM

## 2015-08-08 DIAGNOSIS — C9000 Multiple myeloma not having achieved remission: Secondary | ICD-10-CM

## 2015-08-08 DIAGNOSIS — Z5112 Encounter for antineoplastic immunotherapy: Secondary | ICD-10-CM

## 2015-08-08 LAB — CBC WITH DIFFERENTIAL/PLATELET
BASO%: 0.3 % (ref 0.0–2.0)
Basophils Absolute: 0 10*3/uL (ref 0.0–0.1)
EOS%: 6.2 % (ref 0.0–7.0)
Eosinophils Absolute: 0.2 10*3/uL (ref 0.0–0.5)
HCT: 36.2 % — ABNORMAL LOW (ref 38.4–49.9)
HEMOGLOBIN: 12.1 g/dL — AB (ref 13.0–17.1)
LYMPH#: 0.5 10*3/uL — AB (ref 0.9–3.3)
LYMPH%: 14.6 % (ref 14.0–49.0)
MCH: 32.4 pg (ref 27.2–33.4)
MCHC: 33.4 g/dL (ref 32.0–36.0)
MCV: 96.8 fL (ref 79.3–98.0)
MONO#: 0.4 10*3/uL (ref 0.1–0.9)
MONO%: 11 % (ref 0.0–14.0)
NEUT#: 2.4 10*3/uL (ref 1.5–6.5)
NEUT%: 67.9 % (ref 39.0–75.0)
NRBC: 0 % (ref 0–0)
Platelets: 69 10*3/uL — ABNORMAL LOW (ref 140–400)
RBC: 3.74 10*6/uL — ABNORMAL LOW (ref 4.20–5.82)
RDW: 15.8 % — AB (ref 11.0–14.6)
WBC: 3.6 10*3/uL — ABNORMAL LOW (ref 4.0–10.3)

## 2015-08-08 LAB — COMPREHENSIVE METABOLIC PANEL
ALBUMIN: 3.6 g/dL (ref 3.5–5.0)
ALK PHOS: 59 U/L (ref 40–150)
ALT: 23 U/L (ref 0–55)
AST: 20 U/L (ref 5–34)
Anion Gap: 7 mEq/L (ref 3–11)
BUN: 17.6 mg/dL (ref 7.0–26.0)
CHLORIDE: 102 meq/L (ref 98–109)
CO2: 30 meq/L — AB (ref 22–29)
Calcium: 8.8 mg/dL (ref 8.4–10.4)
Creatinine: 1 mg/dL (ref 0.7–1.3)
EGFR: 73 mL/min/{1.73_m2} — ABNORMAL LOW (ref >90)
GLUCOSE: 104 mg/dL (ref 70–140)
POTASSIUM: 4 meq/L (ref 3.5–5.1)
SODIUM: 139 meq/L (ref 136–145)
Total Bilirubin: 0.88 mg/dL (ref 0.20–1.20)
Total Protein: 5.8 g/dL — ABNORMAL LOW (ref 6.4–8.3)

## 2015-08-08 MED ORDER — HEPARIN SOD (PORK) LOCK FLUSH 100 UNIT/ML IV SOLN
250.0000 [IU] | Freq: Once | INTRAVENOUS | Status: AC | PRN
  Filled 2015-08-08: qty 5

## 2015-08-08 MED ORDER — LENALIDOMIDE 25 MG PO CAPS: 25.0000 mg | ORAL_CAPSULE | Freq: Every day | ORAL | Status: DC

## 2015-08-08 MED ORDER — HEPARIN SOD (PORK) LOCK FLUSH 100 UNIT/ML IV SOLN
500.0000 [IU] | Freq: Once | INTRAVENOUS | Status: AC | PRN
  Filled 2015-08-08: qty 5

## 2015-08-08 MED ORDER — ONDANSETRON HCL 8 MG PO TABS
ORAL_TABLET | ORAL | Status: AC
  Filled 2015-08-08: qty 1

## 2015-08-08 MED ORDER — BORTEZOMIB CHEMO SQ INJECTION 3.5 MG (2.5MG/ML)
1.3000 mg/m2 | Freq: Once | INTRAMUSCULAR | Status: AC
  Administered 2015-08-08: 2.5 mg via SUBCUTANEOUS
  Filled 2015-08-08: qty 2.5

## 2015-08-08 MED ORDER — ONDANSETRON 8 MG PO TBDP
8.0000 mg | ORAL_TABLET | Freq: Once | ORAL | Status: AC
  Administered 2015-08-08: 8 mg via ORAL
  Filled 2015-08-08: qty 1

## 2015-08-08 MED ORDER — SODIUM CHLORIDE 0.9 % IJ SOLN
3.0000 mL | Freq: Once | INTRAMUSCULAR | Status: AC | PRN
  Filled 2015-08-08: qty 10

## 2015-08-08 MED ORDER — SODIUM CHLORIDE 0.9 % IJ SOLN
10.0000 mL | INTRAMUSCULAR | Status: AC | PRN
  Filled 2015-08-08: qty 10

## 2015-08-08 MED ORDER — ZOLEDRONIC ACID 4 MG/100ML IV SOLN
4.0000 mg | Freq: Once | INTRAVENOUS | Status: AC
  Administered 2015-08-08: 4 mg via INTRAVENOUS
  Filled 2015-08-08: qty 100

## 2015-08-08 MED ORDER — ALTEPLASE 2 MG IJ SOLR
2.0000 mg | Freq: Once | INTRAMUSCULAR | Status: AC | PRN
  Filled 2015-08-08: qty 2

## 2015-08-08 NOTE — Progress Notes (Signed)
[place] [phone]   Fax:(336) [place] [address] [place] [address]  DIAGNOSIS: Multiple myeloma diagnosed in November 2015  PRIOR THERAPY:  1) Status post Thoracic five laminectomy for epidural tumor resection, Thoracic 4-7 posterior lateral arthrodesis,  segmental pedicle screw fixation T4-T7 Globus instrumentation under the care of Dr. Christella Noa on 06/18/2014. 2) Systemic chemotherapy with Velcade 1.3 MG/M2 subcutaneously weekly in addition to Decadron 40 mg by mouth on weekly basis. Status post 11 cycles. 3) palliative radiotherapy to the right proximal humerus, shoulder and scapula under the care of Dr. Lisbeth Renshaw completed on 12/12/2014.  CURRENT THERAPY:  1) Systemic chemotherapy with Velcade 1.3 MG/M2 subcutaneously weekly, Decadron 40 mg by mouth weekly in addition to Revlimid 25 MG by mouth daily for 21 days every 4 weeks, status post 10 cycles. He is expected to start cycle #11 next week 2) Zometa 4 mg IV every month. First dose 11/01/2014.  INTERVAL HISTORY: Jeremiah Owens 82 y.o. male returns to the clinic today for follow-up visit accompanied by his wife. The patient is currently on treatment with weekly subcutaneous Velcade, Revlimid and Decadron status post 10 cycle and tolerating the treatment fairly well. He still have some residual cellulitis in the lower extremities, but much improved. He denied having any significant nausea or vomiting, no fever or chills. He denied having any significant weight loss or night sweats. He reports some constipation for which he takes laxatives with resolution. He denied having any significant chest pain, shortness of breath, cough or hemoptysis. He is here today for evaluation prior to cycle 11 of treatment  MEDICAL HISTORY: Past Medical History  Diagnosis Date  . Compression fracture   . Allergy   . Anxiety   . Thyroid disease   . S/P  radiation therapy 08/21/14-09/04/14    T4-6 25Gy/62f  . S/P radiation therapy 11/29/14-12/12/14    rt prox humerus/shoulder/scapula 25Gy/15f . Encounter for antineoplastic chemotherapy 01/31/2015  . [facility]  . [place] cancer ([facility]    t_spine T-5  . [place]state [place] ([facility]  . Skin cancer 1994    melanoma  right neck   . [facility] ([facility]  . Hearing loss   . [facility]    ALLERGIES:  is allergic to iodine.  MEDICATIONS:  Current Outpatient Prescriptions  Medication Sig Dispense Refill  . acyclovir (ZOVIRAX) 400 MG tablet Take 1 tablet (400 mg total) by mouth 2 (two) times daily. 60 tablet 2  . aspirin 81 MG EC tablet Take 81 mg by mouth daily.  0  . Calcium Carbonate-Vitamin D (CALTRATE 600+D PO) Take 600 mg by mouth 2 (two) times daily.    . cholecalciferol (VITAMIN D) 1000 UNITS tablet Take 1,000 Units by mouth at bedtime.     . Marland Kitchenexamethasone (DECADRON) 4 MG tablet TAKE 10 TABS EVERY WEEK, START WITH CHEMO 40 tablet 4  . furosemide (LASIX) 20 MG tablet Take 20 mg by mouth 2 (two) times daily.    . Marland KitchenYDROcodone-acetaminophen (NORCO) 5-325 MG per tablet Take 1 tablet by mouth every 6 (six) hours as needed for moderate pain. 60 tablet 0  . KLOR-CON 10 10 MEQ tablet Take 10 mEq by mouth daily.    . Marland Kitchenenalidomide (REVLIMID) 25 MG capsule Take 1 capsule (25 mg total) by mouth daily. Take one capsule ( 25 mg) by mouth for 21 days every 4 weeks-05/10/15 Authorization number=  [id number] adult male 21 capsule 0  . levothyroxine (SYNTHROID, LEVOTHROID) 50 MCG tablet Take 50 mcg by mouth daily.  1  . methocarbamol (ROBAXIN) 500 MG tablet TAKE 1 TABLET (500 MG TOTAL) BY MOUTH EVERY 6 (SIX) HOURS AS NEEDED FOR MUSCLE SPASMS. 180 tablet 4  . Multiple Vitamins-Minerals (CENTRUM SILVER PO) Take 1 tablet by mouth daily.    Marland Kitchen omeprazole (PRILOSEC) 40 MG capsule Take 40 mg by mouth daily.    . prochlorperazine (COMPAZINE) 10 MG tablet Take 10 mg by mouth daily.  0  . ranitidine (ZANTAC) 150  MG tablet Take 150 mg by mouth daily as needed for heartburn.   11  . sennosides-docusate sodium (SENOKOT-S) 8.6-50 MG tablet Take 1-2 tablets by mouth 2 (two) times daily as needed for constipation. Takes 1-2 tabs daily    . tamsulosin (FLOMAX) 0.4 MG CAPS capsule Take 1 capsule (0.4 mg total) by mouth at bedtime. 30 capsule 3  . warfarin (COUMADIN) 2 MG tablet Take 1 tablet (2 mg total) by mouth daily. 90 tablet 4   No current facility-administered medications for this visit.    SURGICAL HISTORY:  Past Surgical History  Procedure Laterality Date  . Hernia repair    . Posterior cervical fusion/foraminotomy N/A 06/17/2014    Procedure: Thoracic five laminectomy for epidural tumor resection Thoracic 4-7 posterior lateral arthrodesis, segmental pedicle screw fixation.;  Surgeon: Ashok Pall, MD;  Location: [facility];  Service: Neurosurgery;  Laterality: N/A;    REVIEW OF SYSTEMS:  Constitutional: positive for fatigue Eyes: negative Ears, nose, mouth, throat, and face: negative Respiratory: negative Cardiovascular: negative Gastrointestinal: positive for intermittent constipation Genitourinary:negative Integument/breast: negative Hematologic/lymphatic: negative Musculoskeletal:positive for muscle weakness. Cellulitis in lower legs  Is improving. Neurological: negative Behavioral/Psych: negative Endocrine: negative Allergic/Immunologic: negative   PHYSICAL EXAMINATION: General appearance: alert, cooperative, fatigued and no distress Head: Normocephalic, without obvious abnormality, atraumatic Neck: no adenopathy, no JVD, supple, symmetrical, trachea midline and thyroid not enlarged, symmetric, no tenderness/mass/nodules Lymph nodes: Cervical, supraclavicular, and axillary nodes normal. Resp: clear to auscultation bilaterally Back: symmetric, no curvature. ROM normal. No CVA tenderness. Cardio: regular rate and rhythm, S1, S2 normal, no murmur, click, rub or gallop GI: soft,  non-tender; bowel sounds normal; no masses,  no organomegaly Extremities: edema 2+ edema bilaterally with erythema on the legs Neurologic: Alert and oriented X 3, normal strength and tone. Normal symmetric reflexes. Normal coordination and gait Motor: grossly normal Weakness in the left foot  ECOG PERFORMANCE STATUS: 1 - Symptomatic but completely ambulatory  Blood pressure 112/58, pulse 77, temperature 98.4 F (36.9 C), temperature source Oral, resp. rate 18, height _0  (1.753 m), weight 158 lb 4.8 oz (71.804 kg), SpO2 97 %.  LABORATORY DATA: CBC Latest Ref Rng 08/08/2015 08/01/2015 07/25/2015  WBC 4.0 - 10.3 10e3/uL 3.6(L) 2.7(L) 2.9(L)  Hemoglobin 13.0 - 17.1 g/dL 12.1(L) 12.3(L) 13.0  Hematocrit 38.4 - 49.9 % 36.2(L) 37.1(L) 38.6  Platelets 140 - 400 10e3/uL 69(L) 83(L) 127(L)   CMP Latest Ref Rng 08/08/2015 08/01/2015 07/25/2015  Glucose 70 - 140 mg/dl 104 86 97  BUN 7.0 - 26.0 mg/dL 17.6 12.7 17.3  Creatinine 0.7 - 1.3 mg/dL 1.0 1.0 1.0  Sodium 136 - 145 mEq/L 139 141 139  Potassium 3.5 - 5.1 mEq/L 4.0 3.6 4.3  Chloride 101 - 111 mmol/L - - -  CO2 22 - 29 mEq/L 30(H) 31(H) 28  Calcium 8.4 - 10.4 mg/dL 8.8 8.6 9.1  Total Protein 6.4 - 8.3 g/dL 5.8(L)  5.8(L) 6.3(L)  Total Bilirubin 0.20 - 1.20 mg/dL 0.88 0.83 0.69  Alkaline Phos 40 - 150 U/L 59 55 61  AST 5 - 34 U/L _0 ALT 0 - 55 U/L _1 RADIOGRAPHIC STUDIES: Dg Op Swallowing Func-medicare/speech Path  07/25/2015  CLINICAL DATA:  Prior CVA. Follow-up dysphagia with previously demonstrated laryngeal penetration. EXAM: MODIFIED BARIUM SWALLOW TECHNIQUE: Different consistencies of barium were administered orally to the patient by the Speech Pathologist. Imaging of the pharynx was performed in the lateral projection. FLUOROSCOPY TIME:  Fluoroscopy Time:  1 minutes 31 seconds. Number of Acquired Images: Multiple cine sequences, with no exposures. COMPARISON:  05/31/2015 modified barium swallow. FINDINGS: Thin  liquid- Premature spill of the barium bolus into the vallecula and piriform sinuses, with intermittent mild silent overflow aspiration into the laryngeal aditus, which cleared with prompted coughs. Pure- within normal limits Pure with cracker- within normal limits IMPRESSION: 1. Premature spill of the barium bolus into the vallecular and piriform sinuses. 2. Intermittent mild silent overflow aspiration into the laryngeal aditus with thin barium, which cleared with prompted coughs. Please refer to the Speech Pathologists report for complete details and recommendations. Electronically Signed   By: Ilona Sorrel M.D.   On: 07/25/2015 12:21 Objective Swallowing Evaluation:   Patient Details Name: Jeremiah Owens MRN: [id number] Date of Birth: 1930-01-08 Today's Date: 07/25/2015 Time: SLP Start Time (ACUTE ONLY): 1153-SLP Stop Time (ACUTE ONLY): 1215 SLP Time Calculation (min) (ACUTE ONLY): 22 min Past Medical History: Past Medical History Diagnosis Date . Compression fracture  . Allergy  . Anxiety  . Thyroid disease  . S/P radiation therapy 08/21/14-09/04/14   T4-6 25Gy/80f . S/P radiation therapy 11/29/14-12/12/14   rt prox humerus/shoulder/scapula 25Gy/168f. Encounter for antineoplastic chemotherapy 01/31/2015 . [place] . [place] cancer ([facility]   t_spine T-5 . [place]state cancer ([facility] . Skin cancer 1994   melanoma  right neck  . Multiple myeloma ([facility] . Hearing loss  . [facility] Past Surgical History: Past Surgical History Procedure Laterality Date . Hernia repair   . Posterior cervical fusion/foraminotomy N/A 06/17/2014   Procedure: Thoracic five laminectomy for epidural tumor resection Thoracic 4-7 posterior lateral arthrodesis, segmental pedicle screw fixation.;  Surgeon: KyAshok PallMD;  [facility];  Service: Neurosurgery;  Laterality: N/A; HPI: [age]o male with PMH of dysphagia after basal ganglia CVA in July 2016. Most recent MBS in October recommended Regular diet and thin liquids but with need  for head turn right. Since that time he has finished his time with OP SLP but says that he continues to perform his exercises Subjective: pt says he thinks his swallowing has improved Assessment / Plan / Recommendation CHL IP CLINICAL IMPRESSIONS 07/25/2015 Therapy Diagnosis Mild pharyngeal phase dysphagia Clinical Impression Pt still has a mild pharyngeal dysphagia due to delay in swallow trigger. Thin liquids reach the pyfirom sinuses, which fill with liquid causing the liquid to spill over into the laryngeal vestibule. Penetration occurs in small amounts, but is silent and fairly consistent. Min-Mod cues from SLP to control bolus size facilitate with containment in the valleculae prior to the swallow, which increased airway protection. Pt can continue a regular diet and thin liquids without use of head turn as long as he takes small, single sips and uses an intermittent cough. Impact on safety and function Mild aspiration risk   CHL IP TREATMENT RECOMMENDATION 07/25/2015 Treatment Recommendations No treatment recommended at this  time   Prognosis 07/25/2015 Prognosis for Safe Diet Advancement Fair Barriers to Reach Goals Time post onset Barriers/Prognosis Comment -- CHL IP DIET RECOMMENDATION 07/25/2015 SLP Diet Recommendations Regular solids;Thin liquid Liquid Administration via Cup;Straw Medication Administration Whole meds with puree Compensations Slow rate;Small sips/bites;Other (Comment) Postural Changes Seated upright at 90 degrees   CHL IP OTHER RECOMMENDATIONS 07/25/2015 Recommended Consults -- Oral Care Recommendations Oral care BID Other Recommendations --   CHL IP FOLLOW UP RECOMMENDATIONS 07/25/2015 Follow up Recommendations None   CHL IP FREQUENCY AND DURATION 02/11/2015 Speech Therapy Frequency (ACUTE ONLY) min 2x/week Treatment Duration 2 weeks      CHL IP ORAL PHASE 07/25/2015 Oral Phase WFL Oral - Pudding Teaspoon -- Oral - Pudding Cup -- Oral - Honey Teaspoon -- Oral - Honey Cup -- Oral - Nectar  Teaspoon -- Oral - Nectar Cup -- Oral - Nectar Straw -- Oral - Thin Teaspoon -- Oral - Thin Cup -- Oral - Thin Straw -- Oral - Puree -- Oral - Mech Soft -- Oral - Regular -- Oral - Multi-Consistency -- Oral - Pill -- Oral Phase - Comment --  CHL IP PHARYNGEAL PHASE 07/25/2015 Pharyngeal Phase Impaired Pharyngeal- Pudding Teaspoon -- Pharyngeal -- Pharyngeal- Pudding Cup -- Pharyngeal -- Pharyngeal- Honey Teaspoon -- Pharyngeal -- Pharyngeal- Honey Cup -- Pharyngeal -- Pharyngeal- Nectar Teaspoon -- Pharyngeal -- Pharyngeal- Nectar Cup -- Pharyngeal -- Pharyngeal- Nectar Straw -- Pharyngeal -- Pharyngeal- Thin Teaspoon -- Pharyngeal -- Pharyngeal- Thin Cup Delayed swallow initiation-pyriform sinuses;Penetration/Aspiration before swallow Pharyngeal Material enters airway, remains ABOVE vocal cords and not ejected out Pharyngeal- Thin Straw Delayed swallow initiation-pyriform sinuses;Penetration/Aspiration before swallow Pharyngeal Material enters airway, remains ABOVE vocal cords and not ejected out Pharyngeal- Puree WFL Pharyngeal -- Pharyngeal- Mechanical Soft WFL Pharyngeal -- Pharyngeal- Regular -- Pharyngeal -- Pharyngeal- Multi-consistency -- Pharyngeal -- Pharyngeal- Pill -- Pharyngeal -- Pharyngeal Comment --  CHL IP CERVICAL ESOPHAGEAL PHASE 07/25/2015 Cervical Esophageal Phase WFL Pudding Teaspoon -- Pudding Cup -- Honey Teaspoon -- Honey Cup -- Nectar Teaspoon -- Nectar Cup -- Nectar Straw -- Thin Teaspoon -- Thin Cup -- Thin Straw -- Puree -- Mechanical Soft -- Regular -- Multi-consistency -- Pill -- Cervical Esophageal Comment -- Germain Osgood, M.A. 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 He completed treatment with subcutaneous Velcade and Decadron status  post 11 weekly doses. This was discontinued secondary to disease progression. The patient was started on treatment with weekly subcutaneous Velcade, Revlimid for 21 days every 4 weeks in addition to Decadron 40 mg weekly status post 10 cycle and he is tolerating it fairly well.  His myeloma panel showed no evidence for disease progression. Will proceed with Cycle #11 today He would come back for follow-up visit in 3 weeks as priorly scheduled He will proceed with the Zometa infusion today as scheduled. He was advised to call immediately if he has any concerning symptoms in the interval. The patient voices understanding of current disease status and treatment options and is in agreement with the current care plan.  All questions were answered. The patient knows to call the clinic with any problems, questions or concerns. We can certainly see the patient much sooner if necessary.  Rondel Jumbo, PA-C  08/08/2015  ADDENDUM: Hematology/Oncology Attending: I had a face to face encounter with the patient. I recommended his care plan. This is a very pleasant 82 years old white male with multiple myeloma diagnosed in November 2015 and he is currently undergoing systemic chemotherapy with subcutaneous Velcade, Revlimid and Decadron status post 10 cycles. The patient has been tolerating his treatment fairly well with no significant adverse effects except for mild fatigue. I recommended for him to proceed with cycle #11 today as a scheduled. He would come back for follow-up visit in 3 weeks for reevaluation. For the bone disease, the patient will continue with his Zometa infusion as previously ordered. He was advised to call immediately if he has any concerning symptoms in the interval.  Disclaimer: This note was dictated with voice recognition software. Similar sounding words can inadvertently be transcribed and may be missed upon review.   Eilleen Kempf., MD 08/12/2015

## 2015-08-08 NOTE — Progress Notes (Signed)
revlimid refill [facility]ed to [facility]

## 2015-08-08 NOTE — Patient Instructions (Signed)
[facility] Discharge Instructions for Patients Receiving Chemotherapy  Today you received the following chemotherapy agents Zometa/Velcade.  To help prevent nausea and vomiting after your treatment, we encourage you to take your nausea medication as directed.   If you develop nausea and vomiting that is not controlled by your nausea medication, call the clinic.   BELOW ARE SYMPTOMS THAT SHOULD BE REPORTED IMMEDIATELY:  *FEVER GREATER THAN 100.5 F  *CHILLS WITH OR WITHOUT FEVER  NAUSEA AND VOMITING THAT IS NOT CONTROLLED WITH YOUR NAUSEA MEDICATION  *UNUSUAL SHORTNESS OF BREATH  *UNUSUAL BRUISING OR BLEEDING  TENDERNESS IN MOUTH AND THROAT WITH OR WITHOUT PRESENCE OF ULCERS  *URINARY PROBLEMS  *BOWEL PROBLEMS  UNUSUAL RASH Items with * indicate a potential emergency and should be followed up as soon as possible.  Feel free to call the clinic you have any questions or concerns. The clinic phone number is [phone].  Please show the [place] at check-in to the Emergency Department and triage nurse.

## 2015-08-09 ENCOUNTER — Telehealth: Payer: Self-pay | Admitting: Physician Assistant

## 2015-08-09 NOTE — Telephone Encounter (Signed)
pt already sch-per pof for pt to follow sch appts

## 2015-08-14 ENCOUNTER — Other Ambulatory Visit: Payer: Self-pay | Admitting: *Deleted

## 2015-08-14 DIAGNOSIS — C9000 Multiple myeloma not having achieved remission: Secondary | ICD-10-CM

## 2015-08-15 ENCOUNTER — Ambulatory Visit (HOSPITAL_BASED_OUTPATIENT_CLINIC_OR_DEPARTMENT_OTHER): Payer: Medicare Other

## 2015-08-15 ENCOUNTER — Other Ambulatory Visit (HOSPITAL_BASED_OUTPATIENT_CLINIC_OR_DEPARTMENT_OTHER): Payer: Medicare Other

## 2015-08-15 VITALS — BP 122/52 | HR 72 | Temp 98.5°F | Resp 18

## 2015-08-15 DIAGNOSIS — Z5112 Encounter for antineoplastic immunotherapy: Secondary | ICD-10-CM | POA: Diagnosis present

## 2015-08-15 DIAGNOSIS — C9000 Multiple myeloma not having achieved remission: Secondary | ICD-10-CM

## 2015-08-15 LAB — COMPREHENSIVE METABOLIC PANEL
ALT: 21 U/L (ref 0–55)
AST: 17 U/L (ref 5–34)
Albumin: 3.5 g/dL (ref 3.5–5.0)
Alkaline Phosphatase: 56 U/L (ref 40–150)
Anion Gap: 7 mEq/L (ref 3–11)
BUN: 13 mg/dL (ref 7.0–26.0)
CALCIUM: 8.7 mg/dL (ref 8.4–10.4)
CHLORIDE: 103 meq/L (ref 98–109)
CO2: 30 meq/L — AB (ref 22–29)
CREATININE: 1 mg/dL (ref 0.7–1.3)
EGFR: 69 mL/min/{1.73_m2} — ABNORMAL LOW (ref >90)
GLUCOSE: 91 mg/dL (ref 70–140)
POTASSIUM: 4 meq/L (ref 3.5–5.1)
SODIUM: 140 meq/L (ref 136–145)
Total Bilirubin: 0.65 mg/dL (ref 0.20–1.20)
Total Protein: 5.7 g/dL — ABNORMAL LOW (ref 6.4–8.3)

## 2015-08-15 LAB — CBC WITH DIFFERENTIAL/PLATELET
BASO%: 0.3 % (ref 0.0–2.0)
BASOS ABS: 0 10*3/uL (ref 0.0–0.1)
EOS%: 15 % — AB (ref 0.0–7.0)
Eosinophils Absolute: 0.4 10*3/uL (ref 0.0–0.5)
HEMATOCRIT: 36.6 % — AB (ref 38.4–49.9)
HGB: 11.8 g/dL — ABNORMAL LOW (ref 13.0–17.1)
LYMPH#: 0.4 10*3/uL — AB (ref 0.9–3.3)
LYMPH%: 17 % (ref 14.0–49.0)
MCH: 31.7 pg (ref 27.2–33.4)
MCHC: 32.3 g/dL (ref 32.0–36.0)
MCV: 98.2 fL — ABNORMAL HIGH (ref 79.3–98.0)
MONO#: 0.3 10*3/uL (ref 0.1–0.9)
MONO%: 13.9 % (ref 0.0–14.0)
NEUT#: 1.3 10*3/uL — ABNORMAL LOW (ref 1.5–6.5)
NEUT%: 53.8 % (ref 39.0–75.0)
Platelets: 67 10*3/uL — ABNORMAL LOW (ref 140–400)
RBC: 3.73 10*6/uL — AB (ref 4.20–5.82)
RDW: 16.9 % — ABNORMAL HIGH (ref 11.0–14.6)
WBC: 2.4 10*3/uL — ABNORMAL LOW (ref 4.0–10.3)

## 2015-08-15 MED ORDER — ONDANSETRON 8 MG PO TBDP
8.0000 mg | ORAL_TABLET | Freq: Once | ORAL | Status: AC
  Administered 2015-08-15: 8 mg via ORAL
  Filled 2015-08-15: qty 1

## 2015-08-15 MED ORDER — BORTEZOMIB CHEMO SQ INJECTION 3.5 MG (2.5MG/ML)
1.3000 mg/m2 | Freq: Once | INTRAMUSCULAR | Status: AC
  Administered 2015-08-15: 2.5 mg via SUBCUTANEOUS
  Filled 2015-08-15: qty 2.5

## 2015-08-15 NOTE — Progress Notes (Signed)
Okay to treat today despite Platelet = 67 and Neut =1.3, per Dr. Julien Nordmann.

## 2015-08-15 NOTE — Patient Instructions (Addendum)
[facility] Discharge Instructions for Patients Receiving Chemotherapy  Today you received the following chemotherapy agents: Velcade.  To help prevent nausea and vomiting after your treatment, we encourage you to take your nausea medication: Compazine. Take one every 6 hours as needed.   If you develop nausea and vomiting that is not controlled by your nausea medication, call the clinic.   BELOW ARE SYMPTOMS THAT SHOULD BE REPORTED IMMEDIATELY:  *FEVER GREATER THAN 100.5 F  *CHILLS WITH OR WITHOUT FEVER  NAUSEA AND VOMITING THAT IS NOT CONTROLLED WITH YOUR NAUSEA MEDICATION  *UNUSUAL SHORTNESS OF BREATH  *UNUSUAL BRUISING OR BLEEDING  TENDERNESS IN MOUTH AND THROAT WITH OR WITHOUT PRESENCE OF ULCERS  *URINARY PROBLEMS  *BOWEL PROBLEMS  UNUSUAL RASH Items with * indicate a potential emergency and should be followed up as soon as possible.  Feel free to call the clinic should you have any questions or concerns. The clinic phone number is [phone].  Please show the CHEMO ALERT CARD at check-in to the Emergency Department and triage nurse.   

## 2015-08-21 ENCOUNTER — Other Ambulatory Visit: Payer: Self-pay | Admitting: Internal Medicine

## 2015-08-21 DIAGNOSIS — C9000 Multiple myeloma not having achieved remission: Secondary | ICD-10-CM

## 2015-08-22 ENCOUNTER — Other Ambulatory Visit (HOSPITAL_BASED_OUTPATIENT_CLINIC_OR_DEPARTMENT_OTHER): Payer: Medicare Other

## 2015-08-22 ENCOUNTER — Ambulatory Visit (HOSPITAL_BASED_OUTPATIENT_CLINIC_OR_DEPARTMENT_OTHER): Payer: Medicare Other

## 2015-08-22 VITALS — BP 120/52 | HR 72 | Temp 98.3°F | Resp 20

## 2015-08-22 DIAGNOSIS — C9000 Multiple myeloma not having achieved remission: Secondary | ICD-10-CM

## 2015-08-22 DIAGNOSIS — Z5112 Encounter for antineoplastic immunotherapy: Secondary | ICD-10-CM

## 2015-08-22 LAB — COMPREHENSIVE METABOLIC PANEL
ALT: 18 U/L (ref 0–55)
ANION GAP: 6 meq/L (ref 3–11)
AST: 16 U/L (ref 5–34)
Albumin: 3.4 g/dL — ABNORMAL LOW (ref 3.5–5.0)
Alkaline Phosphatase: 55 U/L (ref 40–150)
BILIRUBIN TOTAL: 0.79 mg/dL (ref 0.20–1.20)
BUN: 18.1 mg/dL (ref 7.0–26.0)
CHLORIDE: 106 meq/L (ref 98–109)
CO2: 29 meq/L (ref 22–29)
Calcium: 8.8 mg/dL (ref 8.4–10.4)
Creatinine: 1 mg/dL (ref 0.7–1.3)
EGFR: 71 mL/min/{1.73_m2} — AB (ref >90)
GLUCOSE: 105 mg/dL (ref 70–140)
Potassium: 4.1 mEq/L (ref 3.5–5.1)
SODIUM: 141 meq/L (ref 136–145)
TOTAL PROTEIN: 5.5 g/dL — AB (ref 6.4–8.3)

## 2015-08-22 LAB — CBC WITH DIFFERENTIAL/PLATELET
BASO%: 0 % (ref 0.0–2.0)
Basophils Absolute: 0 10*3/uL (ref 0.0–0.1)
EOS ABS: 0.1 10*3/uL (ref 0.0–0.5)
EOS%: 4 % (ref 0.0–7.0)
HCT: 35.2 % — ABNORMAL LOW (ref 38.4–49.9)
HGB: 11.9 g/dL — ABNORMAL LOW (ref 13.0–17.1)
LYMPH%: 19.4 % (ref 14.0–49.0)
MCH: 32.8 pg (ref 27.2–33.4)
MCHC: 33.8 g/dL (ref 32.0–36.0)
MCV: 97 fL (ref 79.3–98.0)
MONO#: 0.4 10*3/uL (ref 0.1–0.9)
MONO%: 17.3 % — AB (ref 0.0–14.0)
NEUT%: 59.3 % (ref 39.0–75.0)
NEUTROS ABS: 1.5 10*3/uL (ref 1.5–6.5)
PLATELETS: 75 10*3/uL — AB (ref 140–400)
RBC: 3.63 10*6/uL — AB (ref 4.20–5.82)
RDW: 15.6 % — ABNORMAL HIGH (ref 11.0–14.6)
WBC: 2.5 10*3/uL — AB (ref 4.0–10.3)
lymph#: 0.5 10*3/uL — ABNORMAL LOW (ref 0.9–3.3)

## 2015-08-22 MED ORDER — ONDANSETRON 8 MG PO TBDP
8.0000 mg | ORAL_TABLET | Freq: Once | ORAL | Status: AC
  Administered 2015-08-22: 8 mg via ORAL
  Filled 2015-08-22: qty 1

## 2015-08-22 MED ORDER — BORTEZOMIB CHEMO SQ INJECTION 3.5 MG (2.5MG/ML)
1.3000 mg/m2 | Freq: Once | INTRAMUSCULAR | Status: AC
  Administered 2015-08-22: 2.5 mg via SUBCUTANEOUS
  Filled 2015-08-22: qty 2.5

## 2015-08-22 NOTE — Progress Notes (Signed)
OK for treatment today with Velcade with platelets of 75K  VO Dr. Jerilynn Mages  [facility]

## 2015-08-22 NOTE — Patient Instructions (Signed)
[facility] Discharge Instructions for Patients Receiving Chemotherapy  Today you received the following chemotherapy agents: Velcade.  To help prevent nausea and vomiting after your treatment, we encourage you to take your nausea medication: Compazine. Take one every 6 hours as needed.   If you develop nausea and vomiting that is not controlled by your nausea medication, call the clinic.   BELOW ARE SYMPTOMS THAT SHOULD BE REPORTED IMMEDIATELY:  *FEVER GREATER THAN 100.5 F  *CHILLS WITH OR WITHOUT FEVER  NAUSEA AND VOMITING THAT IS NOT CONTROLLED WITH YOUR NAUSEA MEDICATION  *UNUSUAL SHORTNESS OF BREATH  *UNUSUAL BRUISING OR BLEEDING  TENDERNESS IN MOUTH AND THROAT WITH OR WITHOUT PRESENCE OF ULCERS  *URINARY PROBLEMS  *BOWEL PROBLEMS  UNUSUAL RASH Items with * indicate a potential emergency and should be followed up as soon as possible.  Feel free to call the clinic should you have any questions or concerns. The clinic phone number is [phone].  Please show the CHEMO ALERT CARD at check-in to the Emergency Department and triage nurse.   

## 2015-08-26 DIAGNOSIS — S300XXA Contusion of lower back and pelvis, initial encounter: Secondary | ICD-10-CM | POA: Diagnosis not present

## 2015-08-26 DIAGNOSIS — W182XXA Fall in (into) shower or empty bathtub, initial encounter: Secondary | ICD-10-CM | POA: Diagnosis not present

## 2015-08-28 ENCOUNTER — Other Ambulatory Visit: Payer: Self-pay | Admitting: Medical Oncology

## 2015-08-28 DIAGNOSIS — C9000 Multiple myeloma not having achieved remission: Secondary | ICD-10-CM

## 2015-08-28 DIAGNOSIS — E039 Hypothyroidism, unspecified: Secondary | ICD-10-CM | POA: Diagnosis not present

## 2015-08-28 MED ORDER — LENALIDOMIDE 25 MG PO CAPS: 25.0000 mg | ORAL_CAPSULE | Freq: Every day | ORAL | Status: DC

## 2015-08-28 NOTE — Progress Notes (Signed)
F[facility]ed [facility]id to [facility] 

## 2015-08-29 ENCOUNTER — Ambulatory Visit (HOSPITAL_BASED_OUTPATIENT_CLINIC_OR_DEPARTMENT_OTHER): Payer: Medicare Other | Admitting: Internal Medicine

## 2015-08-29 ENCOUNTER — Ambulatory Visit (HOSPITAL_BASED_OUTPATIENT_CLINIC_OR_DEPARTMENT_OTHER): Payer: Medicare Other

## 2015-08-29 ENCOUNTER — Other Ambulatory Visit (HOSPITAL_BASED_OUTPATIENT_CLINIC_OR_DEPARTMENT_OTHER): Payer: Medicare Other

## 2015-08-29 ENCOUNTER — Other Ambulatory Visit: Payer: Medicare Other | Admitting: *Deleted

## 2015-08-29 ENCOUNTER — Encounter: Payer: Self-pay | Admitting: Internal Medicine

## 2015-08-29 VITALS — BP 122/46 | HR 89 | Temp 98.3°F | Resp 18 | Ht 69.0 in | Wt 157.2 lb

## 2015-08-29 DIAGNOSIS — Z5112 Encounter for antineoplastic immunotherapy: Secondary | ICD-10-CM

## 2015-08-29 DIAGNOSIS — C9000 Multiple myeloma not having achieved remission: Secondary | ICD-10-CM

## 2015-08-29 DIAGNOSIS — C419 Malignant neoplasm of bone and articular cartilage, unspecified: Secondary | ICD-10-CM

## 2015-08-29 DIAGNOSIS — D61818 Other pancytopenia: Secondary | ICD-10-CM | POA: Diagnosis not present

## 2015-08-29 DIAGNOSIS — D492 Neoplasm of unspecified behavior of bone, soft tissue, and skin: Secondary | ICD-10-CM

## 2015-08-29 LAB — CBC WITH DIFFERENTIAL/PLATELET
BASO%: 0.4 % (ref 0.0–2.0)
Basophils Absolute: 0 10*3/uL (ref 0.0–0.1)
EOS%: 4.9 % (ref 0.0–7.0)
Eosinophils Absolute: 0.1 10*3/uL (ref 0.0–0.5)
HCT: 35.8 % — ABNORMAL LOW (ref 38.4–49.9)
HEMOGLOBIN: 12 g/dL — AB (ref 13.0–17.1)
LYMPH#: 0.3 10*3/uL — AB (ref 0.9–3.3)
LYMPH%: 13.4 % — ABNORMAL LOW (ref 14.0–49.0)
MCH: 32.2 pg (ref 27.2–33.4)
MCHC: 33.5 g/dL (ref 32.0–36.0)
MCV: 96 fL (ref 79.3–98.0)
MONO#: 0.2 10*3/uL (ref 0.1–0.9)
MONO%: 7.1 % (ref 0.0–14.0)
NEUT#: 1.7 10*3/uL (ref 1.5–6.5)
NEUT%: 74.2 % (ref 39.0–75.0)
Platelets: 83 10*3/uL — ABNORMAL LOW (ref 140–400)
RBC: 3.73 10*6/uL — ABNORMAL LOW (ref 4.20–5.82)
RDW: 15.7 % — AB (ref 11.0–14.6)
WBC: 2.2 10*3/uL — AB (ref 4.0–10.3)
nRBC: 0 % (ref 0–0)

## 2015-08-29 LAB — COMPREHENSIVE METABOLIC PANEL
ALBUMIN: 3.6 g/dL (ref 3.5–5.0)
ALK PHOS: 56 U/L (ref 40–150)
ALT: 16 U/L (ref 0–55)
AST: 16 U/L (ref 5–34)
Anion Gap: 8 mEq/L (ref 3–11)
BUN: 17.2 mg/dL (ref 7.0–26.0)
CHLORIDE: 100 meq/L (ref 98–109)
CO2: 32 mEq/L — ABNORMAL HIGH (ref 22–29)
Calcium: 8.9 mg/dL (ref 8.4–10.4)
Creatinine: 1.1 mg/dL (ref 0.7–1.3)
EGFR: 65 mL/min/{1.73_m2} — ABNORMAL LOW (ref >90)
GLUCOSE: 82 mg/dL (ref 70–140)
POTASSIUM: 3.9 meq/L (ref 3.5–5.1)
SODIUM: 139 meq/L (ref 136–145)
Total Bilirubin: 0.71 mg/dL (ref 0.20–1.20)
Total Protein: 5.8 g/dL — ABNORMAL LOW (ref 6.4–8.3)

## 2015-08-29 MED ORDER — ONDANSETRON HCL 8 MG PO TABS
ORAL_TABLET | ORAL | Status: AC
  Filled 2015-08-29: qty 1

## 2015-08-29 MED ORDER — BORTEZOMIB CHEMO SQ INJECTION 3.5 MG (2.5MG/ML)
1.3000 mg/m2 | Freq: Once | INTRAMUSCULAR | Status: AC
  Administered 2015-08-29: 2.5 mg via SUBCUTANEOUS
  Filled 2015-08-29: qty 2.5

## 2015-08-29 MED ORDER — ONDANSETRON HCL 8 MG PO TABS
8.0000 mg | ORAL_TABLET | Freq: Once | ORAL | Status: AC
  Administered 2015-08-29: 8 mg via ORAL

## 2015-08-29 MED ORDER — ONDANSETRON 8 MG PO TBDP: 8.0000 mg | ORAL_TABLET | Freq: Once | ORAL | Status: DC

## 2015-08-29 NOTE — Progress Notes (Signed)
Received verbal order OK to treat despite low blood counts per Dr Julien Nordmann.  Repeated & verified.

## 2015-08-29 NOTE — Progress Notes (Signed)
Labs reviewed by MD, VO Ok to treat.

## 2015-08-29 NOTE — Patient Instructions (Signed)
[facility] Discharge Instructions for Patients Receiving Chemotherapy  Today you received the following chemotherapy agents velcade.  To help prevent nausea and vomiting after your treatment, we encourage you to take your nausea medication .   If you develop nausea and vomiting that is not controlled by your nausea medication, call the clinic.   BELOW ARE SYMPTOMS THAT SHOULD BE REPORTED IMMEDIATELY:  *FEVER GREATER THAN 100.5 F  *CHILLS WITH OR WITHOUT FEVER  NAUSEA AND VOMITING THAT IS NOT CONTROLLED WITH YOUR NAUSEA MEDICATION  *UNUSUAL SHORTNESS OF BREATH  *UNUSUAL BRUISING OR BLEEDING  TENDERNESS IN MOUTH AND THROAT WITH OR WITHOUT PRESENCE OF ULCERS  *URINARY PROBLEMS  *BOWEL PROBLEMS  UNUSUAL RASH Items with * indicate a potential emergency and should be followed up as soon as possible.  Feel free to call the clinic you have any questions or concerns. The clinic phone number is [phone].  Please show the CHEMO ALERT CARD at check-in to the Emergency Department and triage nurse.   

## 2015-08-29 NOTE — Progress Notes (Signed)
Jeremiah Owens [phone]   Fax:([phone]) [address] [place] [address]  DIAGNOSIS: Multiple myeloma diagnosed in November 2015  PRIOR THERAPY:  1) Status post Thoracic five laminectomy for epidural tumor resection, Thoracic 4-7 posterior lateral arthrodesis,  segmental pedicle screw fixation T4-T7 Globus instrumentation under the care of Dr. Christella Noa on 06/18/2014. 2) Systemic chemotherapy with Velcade 1.3 MG/M2 subcutaneously weekly in addition to Decadron 40 mg by mouth on weekly basis. Status post 11 cycles. 3) palliative radiotherapy to the right proximal humerus, shoulder and scapula under the care of Dr. Lisbeth Renshaw completed on 12/12/2014.  CURRENT THERAPY:  1) Systemic chemotherapy with Velcade 1.3 MG/M2 subcutaneously weekly, Decadron 40 mg by mouth weekly in addition to Revlimid 25 MG by mouth daily for 21 days every 4 weeks, status post 11 cycles. He is currently undergoing cycle #12. 2) Zometa 4 mg IV every month. First dose 11/01/2014.  INTERVAL HISTORY: Jeremiah Owens 82 y.o. male returns to the clinic today for follow-up visit accompanied by his wife. The patient is currently on treatment with weekly subcutaneous Velcade, Revlimid and Decadron status post 11 cycle and tolerating the treatment fairly well. He had a recent fall with some bruises in the back. He also has few episodes of diarrhea yesterday improved with Imodium. He denied having any significant nausea or vomiting, no fever or chills. He denied having any significant weight loss or night sweats. He denied having any significant chest pain, shortness of breath, cough or hemoptysis. He had repeat CBC and comprehensive metabolic panel performed earlier today and he is here for evaluation and discussion of his lab results.  MEDICAL HISTORY: Past Medical History  Diagnosis Date  . Compression fracture   . Allergy   . Anxiety     . Thyroid disease   . S/P radiation therapy 08/21/14-09/04/14    T4-6 25Gy/99f  . S/P radiation therapy 11/29/14-12/12/14    rt prox humerus/shoulder/scapula 25Gy/110f . Encounter for antineoplastic chemotherapy 01/31/2015  . Melanoma ([facility]  . Bone cancer ([facility]    t_spine T-5  . Prostate cancer ([facility]  . Skin cancer 1994    melanoma  right neck   . Multiple myeloma ([facility]  . Hearing loss   . [facility]    ALLERGIES:  is allergic to iodine.  MEDICATIONS:  Current Outpatient Prescriptions  Medication Sig Dispense Refill  . acyclovir (ZOVIRAX) 400 MG tablet Take 1 tablet (400 mg total) by mouth 2 (two) times daily. 60 tablet 2  . aspirin 81 MG EC tablet Take 81 mg by mouth daily.  0  . Calcium Carbonate-Vitamin D (CALTRATE 600+D PO) Take 600 mg by mouth 2 (two) times daily.    . cholecalciferol (VITAMIN D) 1000 UNITS tablet Take 1,000 Units by mouth at bedtime.     . Marland Kitchenexamethasone (DECADRON) 4 MG tablet TAKE 10 TABS EVERY WEEK, START WITH CHEMO 40 tablet 4  . furosemide (LASIX) 20 MG tablet Take 20 mg by mouth 2 (two) times daily.    . Marland KitchenLOR-CON 10 10 MEQ tablet Take 10 mEq by mouth daily.    . Marland Kitchenenalidomide (REVLIMID) 25 MG capsule Take 1 capsule (25 mg total) by mouth daily. Take one capsule ( 25 mg) by mouth for 21 days every 4 weeks-1/24/17Authorization number=  [id number] male 21 capsule 0  . levothyroxine (SYNTHROID, LEVOTHROID) 50 MCG tablet Take 88 mcg by  mouth daily.   1  . methocarbamol (ROBAXIN) 500 MG tablet TAKE 1 TABLET (500 MG TOTAL) BY MOUTH EVERY 6 (SIX) HOURS AS NEEDED FOR MUSCLE SPASMS. 180 tablet 4  . Multiple Vitamins-Minerals (CENTRUM SILVER PO) Take 1 tablet by mouth daily.    . mupirocin ointment (BACTROBAN) 2 %     . omeprazole (PRILOSEC) 40 MG capsule Take 40 mg by mouth daily.    . prochlorperazine (COMPAZINE) 10 MG tablet Take 10 mg by mouth daily.  0  . sennosides-docusate sodium (SENOKOT-S) 8.6-50 MG tablet Take 1-2 tablets by mouth 2 (two) times  daily as needed for constipation. Takes 1-2 tabs daily    . tamsulosin (FLOMAX) 0.4 MG CAPS capsule Take 1 capsule (0.4 mg total) by mouth at bedtime. 30 capsule 3  . warfarin (COUMADIN) 2 MG tablet Take 1 tablet (2 mg total) by mouth daily. 90 tablet 4  . HYDROcodone-acetaminophen (NORCO) 5-325 MG per tablet Take 1 tablet by mouth every 6 (six) hours as needed for moderate pain. (Patient not taking: Reported on 08/29/2015) 60 tablet 0  . ranitidine (ZANTAC) 150 MG tablet Take 150 mg by mouth daily as needed for heartburn.   11   No current facility-administered medications for this visit.   Facility-Administered Medications Ordered in Other Visits  Medication Dose Route Frequency Provider Last Rate Last Dose  . sodium chloride 0.9 % injection 10 mL  10 mL Intracatheter PRN Curt Bears, MD   10 mL at 08/08/15 1144    SURGICAL HISTORY:  Past Surgical History  Procedure Laterality Date  . Hernia repair    . Posterior cervical fusion/foraminotomy N/A 06/17/2014    Procedure: Thoracic five laminectomy for epidural tumor resection Thoracic 4-7 posterior lateral arthrodesis, segmental pedicle screw fixation.;  Surgeon: Ashok Pall, MD;  Location: [facility];  Service: Neurosurgery;  Laterality: N/A;    REVIEW OF SYSTEMS:  A comprehensive review of systems was negative except for: Constitutional: positive for fatigue Gastrointestinal: positive for diarrhea Musculoskeletal: positive for muscle weakness   PHYSICAL EXAMINATION: General appearance: alert, cooperative, fatigued and no distress Head: Normocephalic, without obvious abnormality, atraumatic Neck: no adenopathy, no JVD, supple, symmetrical, trachea midline and thyroid not enlarged, symmetric, no tenderness/mass/nodules Lymph nodes: Cervical, supraclavicular, and axillary nodes normal. Resp: clear to auscultation bilaterally Back: symmetric, no curvature. ROM normal. No CVA tenderness. Cardio: regular rate and rhythm, S1, S2  normal, no murmur, click, rub or gallop GI: soft, non-tender; bowel sounds normal; no masses,  no organomegaly Extremities: edema 2+ edema bilaterally with erythema on the legs Neurologic: Alert and oriented X 3, normal strength and tone. Normal symmetric reflexes. Normal coordination and gait Motor: grossly normal Weakness in the left foot  ECOG PERFORMANCE STATUS: 1 - Symptomatic but completely ambulatory  Blood pressure 122/46, pulse 89, temperature 98.3 F (36.8 C), temperature source Oral, resp. rate 18, height _0  (1.753 m), weight 157 lb 3.2 oz (71.305 kg), SpO2 99 %.  LABORATORY DATA: Lab Results  Component Value Date   WBC 2.2* 08/29/2015   HGB 12.0* 08/29/2015   HCT 35.8* 08/29/2015   MCV 96.0 08/29/2015   PLT 83* 08/29/2015      Chemistry      Component Value Date/Time   NA 139 08/29/2015 1104   NA 134* 02/21/2015 0626   K 3.9 08/29/2015 1104   K 4.1 02/21/2015 0626   CL 101 02/21/2015 0626   CO2 32* 08/29/2015 1104   CO2 26 02/21/2015 0626   BUN  17.2 08/29/2015 1104   BUN 12 02/21/2015 0626   CREATININE 1.1 08/29/2015 1104   CREATININE 0.84 02/21/2015 0626      Component Value Date/Time   CALCIUM 8.9 08/29/2015 1104   CALCIUM 7.9* 02/21/2015 0626   ALKPHOS 56 08/29/2015 1104   ALKPHOS 48 02/21/2015 0626   AST 16 08/29/2015 1104   AST 17 02/21/2015 0626   ALT 16 08/29/2015 1104   ALT 25 02/21/2015 0626   BILITOT 0.71 08/29/2015 1104   BILITOT 1.1 02/21/2015 0626       RADIOGRAPHIC STUDIES: No results found.  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 He completed treatment with subcutaneous Velcade and Decadron status post 11 weekly doses. This was discontinued secondary to disease progression. The patient was started on treatment with weekly subcutaneous Velcade, Revlimid for 21  days every 4 weeks in addition to Decadron 40 mg weekly status post 11 cycle and he is currently on treatment with cycle #12. He is tolerating it fairly well.  His CBC today showed persistent pancytopenia but reasonable enough to continue with his treatment. I discussed the lab results with the patient today. I recommended for the patient to continue his treatment today as a scheduled next week.Marland Kitchen He would come back for follow-up visit in 4 weeks for reevaluation He will proceed with the Zometa infusion as scheduled. He was advised to call immediately if he has any concerning symptoms in the interval. The patient voices understanding of current disease status and treatment options and is in agreement with the current care plan.  All questions were answered. The patient knows to call the clinic with any problems, questions or concerns. We can certainly see the patient much sooner if necessary.  Disclaimer: This note was dictated with voice recognition software. Similar sounding words can inadvertently be transcribed and may not be corrected upon review.

## 2015-08-30 ENCOUNTER — Telehealth: Payer: Self-pay | Admitting: *Deleted

## 2015-08-30 NOTE — Telephone Encounter (Signed)
Per staff message and POF I have scheduled appts. Advised scheduler of appts and to move labs. JMW  

## 2015-08-31 ENCOUNTER — Telehealth: Payer: Self-pay | Admitting: Internal Medicine

## 2015-08-31 NOTE — Telephone Encounter (Signed)
Pt confirmed labs/ov per 01/25 POF, gave pt AVS and Calendar.Cherylann Banas, sent msg to add chemo, sent msg to verify MD visit day before chemo and move chemo with labs/ov, ok per MD staff msg, lft msg for pt on 01/27 changes on schedule and mailed out updated schedule... KJ

## 2015-09-05 ENCOUNTER — Ambulatory Visit (HOSPITAL_BASED_OUTPATIENT_CLINIC_OR_DEPARTMENT_OTHER): Payer: Medicare Other

## 2015-09-05 ENCOUNTER — Other Ambulatory Visit (HOSPITAL_BASED_OUTPATIENT_CLINIC_OR_DEPARTMENT_OTHER): Payer: Medicare Other

## 2015-09-05 VITALS — BP 137/78 | HR 74 | Temp 98.1°F | Resp 18

## 2015-09-05 DIAGNOSIS — C9000 Multiple myeloma not having achieved remission: Secondary | ICD-10-CM | POA: Diagnosis present

## 2015-09-05 DIAGNOSIS — Z5112 Encounter for antineoplastic immunotherapy: Secondary | ICD-10-CM | POA: Diagnosis present

## 2015-09-05 LAB — CBC WITH DIFFERENTIAL/PLATELET
BASO%: 0.5 % (ref 0.0–2.0)
Basophils Absolute: 0 10*3/uL (ref 0.0–0.1)
EOS ABS: 0.2 10*3/uL (ref 0.0–0.5)
EOS%: 6 % (ref 0.0–7.0)
HEMATOCRIT: 37.7 % — AB (ref 38.4–49.9)
HEMOGLOBIN: 12.5 g/dL — AB (ref 13.0–17.1)
LYMPH#: 0.4 10*3/uL — AB (ref 0.9–3.3)
LYMPH%: 14.8 % (ref 14.0–49.0)
MCH: 32.2 pg (ref 27.2–33.4)
MCHC: 33.2 g/dL (ref 32.0–36.0)
MCV: 96.9 fL (ref 79.3–98.0)
MONO#: 0.5 10*3/uL (ref 0.1–0.9)
MONO%: 18.2 % — ABNORMAL HIGH (ref 0.0–14.0)
NEUT%: 60.5 % (ref 39.0–75.0)
NEUTROS ABS: 1.7 10*3/uL (ref 1.5–6.5)
PLATELETS: 69 10*3/uL — AB (ref 140–400)
RBC: 3.89 10*6/uL — ABNORMAL LOW (ref 4.20–5.82)
RDW: 16.6 % — AB (ref 11.0–14.6)
WBC: 2.9 10*3/uL — AB (ref 4.0–10.3)

## 2015-09-05 LAB — COMPREHENSIVE METABOLIC PANEL
ALBUMIN: 3.5 g/dL (ref 3.5–5.0)
ALK PHOS: 56 U/L (ref 40–150)
ALT: 17 U/L (ref 0–55)
ANION GAP: 7 meq/L (ref 3–11)
AST: 16 U/L (ref 5–34)
BILIRUBIN TOTAL: 0.89 mg/dL (ref 0.20–1.20)
BUN: 17.8 mg/dL (ref 7.0–26.0)
CALCIUM: 8.7 mg/dL (ref 8.4–10.4)
CO2: 31 meq/L — AB (ref 22–29)
CREATININE: 1 mg/dL (ref 0.7–1.3)
Chloride: 102 mEq/L (ref 98–109)
EGFR: 66 mL/min/{1.73_m2} — AB (ref >90)
Glucose: 88 mg/dl (ref 70–140)
Potassium: 4.1 mEq/L (ref 3.5–5.1)
Sodium: 140 mEq/L (ref 136–145)
TOTAL PROTEIN: 5.7 g/dL — AB (ref 6.4–8.3)

## 2015-09-05 MED ORDER — SODIUM CHLORIDE 0.9 % IV SOLN
Freq: Once | INTRAVENOUS | Status: AC
  Administered 2015-09-05: 12:00:00 via INTRAVENOUS

## 2015-09-05 MED ORDER — HEPARIN SOD (PORK) LOCK FLUSH 100 UNIT/ML IV SOLN
500.0000 [IU] | Freq: Once | INTRAVENOUS | Status: AC | PRN
  Filled 2015-09-05: qty 5

## 2015-09-05 MED ORDER — ZOLEDRONIC ACID 4 MG/100ML IV SOLN
4.0000 mg | Freq: Once | INTRAVENOUS | Status: AC
  Administered 2015-09-05: 4 mg via INTRAVENOUS
  Filled 2015-09-05: qty 100

## 2015-09-05 MED ORDER — BORTEZOMIB CHEMO SQ INJECTION 3.5 MG (2.5MG/ML)
1.3000 mg/m2 | Freq: Once | INTRAMUSCULAR | Status: AC
  Administered 2015-09-05: 2.5 mg via SUBCUTANEOUS
  Filled 2015-09-05: qty 2.5

## 2015-09-05 MED ORDER — ONDANSETRON 8 MG PO TBDP
8.0000 mg | ORAL_TABLET | Freq: Once | ORAL | Status: AC
  Administered 2015-09-05: 8 mg via ORAL
  Filled 2015-09-05: qty 1

## 2015-09-05 NOTE — Progress Notes (Deleted)
OK to tx with PLT of 69 per Dr. Julien Nordmann.

## 2015-09-05 NOTE — Progress Notes (Unsigned)
Per Dr Julien Nordmann it is ok to treat pt today with chemotherapy and todays labs.

## 2015-09-05 NOTE — Patient Instructions (Signed)
[facility] Discharge Instructions for Patients Receiving Chemotherapy  Today you received the following chemotherapy agents:  velcade  To help prevent nausea and vomiting after your treatment, we encourage you to take your nausea medication.   If you develop nausea and vomiting that is not controlled by your nausea medication, call the clinic.   BELOW ARE SYMPTOMS THAT SHOULD BE REPORTED IMMEDIATELY:  *FEVER GREATER THAN 100.5 F  *CHILLS WITH OR WITHOUT FEVER  NAUSEA AND VOMITING THAT IS NOT CONTROLLED WITH YOUR NAUSEA MEDICATION  *UNUSUAL SHORTNESS OF BREATH  *UNUSUAL BRUISING OR BLEEDING  TENDERNESS IN MOUTH AND THROAT WITH OR WITHOUT PRESENCE OF ULCERS  *URINARY PROBLEMS  *BOWEL PROBLEMS  UNUSUAL RASH Items with * indicate a potential emergency and should be followed up as soon as possible.  Feel free to call the clinic you have any questions or concerns. The clinic phone number is [phone].  Please show the [place] at check-in to the Emergency Department and triage nurse.  Zoledronic Acid injection (Hypercalcemia, Oncology) What is this medicine? ZOLEDRONIC ACID (ZOE le dron ik AS id) lowers the amount of calcium loss from bone. It is used to treat too much calcium in your blood from cancer. It is also used to prevent complications of cancer that has spread to the bone. This medicine may be used for other purposes; ask your health care provider or pharmacist if you have questions. What should I tell my health care provider before I take this medicine? They need to know if you have any of these conditions: -aspirin-sensitive asthma -cancer, especially if you are receiving medicines used to treat cancer -dental disease or wear dentures -infection -kidney disease -receiving corticosteroids like dexamethasone or prednisone -an unusual or allergic reaction to zoledronic acid, other medicines, foods, dyes, or preservatives -pregnant or trying  to get pregnant -breast-feeding How should I use this medicine? This medicine is for infusion into a vein. It is given by a health care professional in a hospital or clinic setting. Talk to your pediatrician regarding the use of this medicine in children. Special care may be needed. Overdosage: If you think you have taken too much of this medicine contact a poison control center or emergency room at once. NOTE: This medicine is only for you. Do not share this medicine with others. What if I miss a dose? It is important not to miss your dose. Call your doctor or health care professional if you are unable to keep an appointment. What may interact with this medicine? -certain antibiotics given by injection -NSAIDs, medicines for pain and inflammation, like ibuprofen or naproxen -some diuretics like bumetanide, furosemide -teriparatide -thalidomide This list may not describe all possible interactions. Give your health care provider a list of all the medicines, herbs, non-prescription drugs, or dietary supplements you use. Also tell them if you smoke, drink alcohol, or use illegal drugs. Some items may interact with your medicine. What should I watch for while using this medicine? Visit your doctor or health care professional for regular checkups. It may be some time before you see the benefit from this medicine. Do not stop taking your medicine unless your doctor tells you to. Your doctor may order blood tests or other tests to see how you are doing. Women should inform their doctor if they wish to become pregnant or think they might be pregnant. There is a potential for serious side effects to an unborn child. Talk to your health care professional or pharmacist  for more information. You should make sure that you get enough calcium and vitamin D while you are taking this medicine. Discuss the foods you eat and the vitamins you take with your health care professional. Some people who take this  medicine have severe bone, joint, and/or muscle pain. This medicine may also increase your risk for jaw problems or a broken thigh bone. Tell your doctor right away if you have severe pain in your jaw, bones, joints, or muscles. Tell your doctor if you have any pain that does not go away or that gets worse. Tell your dentist and dental surgeon that you are taking this medicine. You should not have major dental surgery while on this medicine. See your dentist to have a dental exam and fix any dental problems before starting this medicine. Take good care of your teeth while on this medicine. Make sure you see your dentist for regular follow-up appointments. What side effects may I notice from receiving this medicine? Side effects that you should report to your doctor or health care professional as soon as possible: -allergic reactions like skin rash, itching or hives, swelling of the face, lips, or tongue -anxiety, confusion, or depression -breathing problems -changes in vision -eye pain -feeling faint or lightheaded, falls -jaw pain, especially after dental work -mouth sores -muscle cramps, stiffness, or weakness -redness, blistering, peeling or loosening of the skin, including inside the mouth -trouble passing urine or change in the amount of urine Side effects that usually do not require medical attention (report to your doctor or health care professional if they continue or are bothersome): -bone, joint, or muscle pain -constipation -diarrhea -fever -hair loss -irritation at site where injected -loss of appetite -nausea, vomiting -stomach upset -trouble sleeping -trouble swallowing -weak or tired This list may not describe all possible side effects. Call your doctor for medical advice about side effects. You may report side effects to FDA at 1-800-FDA-1088. Where should I keep my medicine? This drug is given in a hospital or clinic and will not be stored at home. NOTE: This sheet is  a summary. It may not cover all possible information. If you have questions about this medicine, talk to your doctor, pharmacist, or health care provider.    2016, Elsevier/Gold Standard. (2013-12-17 14:19:39)

## 2015-09-12 ENCOUNTER — Other Ambulatory Visit (HOSPITAL_BASED_OUTPATIENT_CLINIC_OR_DEPARTMENT_OTHER): Payer: Medicare Other

## 2015-09-12 ENCOUNTER — Ambulatory Visit (HOSPITAL_BASED_OUTPATIENT_CLINIC_OR_DEPARTMENT_OTHER): Payer: Medicare Other

## 2015-09-12 VITALS — BP 126/52 | HR 70 | Temp 97.9°F | Resp 16

## 2015-09-12 DIAGNOSIS — Z5112 Encounter for antineoplastic immunotherapy: Secondary | ICD-10-CM | POA: Diagnosis present

## 2015-09-12 DIAGNOSIS — C9000 Multiple myeloma not having achieved remission: Secondary | ICD-10-CM | POA: Diagnosis present

## 2015-09-12 LAB — CBC WITH DIFFERENTIAL/PLATELET
BASO%: 0.4 % (ref 0.0–2.0)
Basophils Absolute: 0 10*3/uL (ref 0.0–0.1)
EOS ABS: 0.3 10*3/uL (ref 0.0–0.5)
EOS%: 11.8 % — ABNORMAL HIGH (ref 0.0–7.0)
HCT: 34.1 % — ABNORMAL LOW (ref 38.4–49.9)
HGB: 11.5 g/dL — ABNORMAL LOW (ref 13.0–17.1)
LYMPH%: 22.7 % (ref 14.0–49.0)
MCH: 32.2 pg (ref 27.2–33.4)
MCHC: 33.7 g/dL (ref 32.0–36.0)
MCV: 95.5 fL (ref 79.3–98.0)
MONO#: 0.3 10*3/uL (ref 0.1–0.9)
MONO%: 12.6 % (ref 0.0–14.0)
NEUT%: 52.5 % (ref 39.0–75.0)
NEUTROS ABS: 1.3 10*3/uL — AB (ref 1.5–6.5)
NRBC: 0 % (ref 0–0)
Platelets: 55 10*3/uL — ABNORMAL LOW (ref 140–400)
RBC: 3.57 10*6/uL — AB (ref 4.20–5.82)
RDW: 15.6 % — AB (ref 11.0–14.6)
WBC: 2.4 10*3/uL — AB (ref 4.0–10.3)
lymph#: 0.5 10*3/uL — ABNORMAL LOW (ref 0.9–3.3)

## 2015-09-12 LAB — COMPREHENSIVE METABOLIC PANEL
ALT: 22 U/L (ref 0–55)
AST: 20 U/L (ref 5–34)
Albumin: 3.4 g/dL — ABNORMAL LOW (ref 3.5–5.0)
Alkaline Phosphatase: 64 U/L (ref 40–150)
Anion Gap: 9 mEq/L (ref 3–11)
BILIRUBIN TOTAL: 0.77 mg/dL (ref 0.20–1.20)
BUN: 13.7 mg/dL (ref 7.0–26.0)
CHLORIDE: 104 meq/L (ref 98–109)
CO2: 28 meq/L (ref 22–29)
CREATININE: 0.9 mg/dL (ref 0.7–1.3)
Calcium: 8.5 mg/dL (ref 8.4–10.4)
EGFR: 75 mL/min/{1.73_m2} — ABNORMAL LOW (ref >90)
GLUCOSE: 91 mg/dL (ref 70–140)
Potassium: 3.8 mEq/L (ref 3.5–5.1)
SODIUM: 141 meq/L (ref 136–145)
TOTAL PROTEIN: 5.5 g/dL — AB (ref 6.4–8.3)

## 2015-09-12 MED ORDER — ONDANSETRON 8 MG PO TBDP
8.0000 mg | ORAL_TABLET | Freq: Once | ORAL | Status: AC
Start: 2015-09-12 — End: 2015-09-12
  Administered 2015-09-12: 8 mg via ORAL
  Filled 2015-09-12: qty 1

## 2015-09-12 MED ORDER — BORTEZOMIB CHEMO SQ INJECTION 3.5 MG (2.5MG/ML)
1.3000 mg/m2 | Freq: Once | INTRAMUSCULAR | Status: AC
  Administered 2015-09-12: 2.5 mg via SUBCUTANEOUS
  Filled 2015-09-12: qty 2.5

## 2015-09-12 NOTE — Patient Instructions (Signed)
[facility] Discharge Instructions for Patients Receiving Chemotherapy  Today you received the following chemotherapy agents Velcade. To help prevent nausea and vomiting after your treatment, we encourage you to take your nausea medication as directed.  If you develop nausea and vomiting that is not controlled by your nausea medication, call the clinic.   BELOW ARE SYMPTOMS THAT SHOULD BE REPORTED IMMEDIATELY:  *FEVER GREATER THAN 100.5 F  *CHILLS WITH OR WITHOUT FEVER  NAUSEA AND VOMITING THAT IS NOT CONTROLLED WITH YOUR NAUSEA MEDICATION  *UNUSUAL SHORTNESS OF BREATH  *UNUSUAL BRUISING OR BLEEDING  TENDERNESS IN MOUTH AND THROAT WITH OR WITHOUT PRESENCE OF ULCERS  *URINARY PROBLEMS  *BOWEL PROBLEMS  UNUSUAL RASH Items with * indicate a potential emergency and should be followed up as soon as possible.  Feel free to call the clinic you have any questions or concerns. The clinic phone number is [phone].  Please show the CHEMO ALERT CARD at check-in to the Emergency Department and triage nurse.    

## 2015-09-12 NOTE — Progress Notes (Signed)
Okay to treat despite today's plt count of 55 and ANC 1.3 per Dr. Earlie Server, MD.

## 2015-09-13 ENCOUNTER — Encounter: Payer: Self-pay | Admitting: *Deleted

## 2015-09-13 ENCOUNTER — Other Ambulatory Visit: Payer: Self-pay | Admitting: *Deleted

## 2015-09-13 NOTE — Patient Outreach (Signed)
HIGH RISK PATIENT SCREENING - I spoke with both [name]. Although [name] has multiple myloma, he is being seen weekly by his oncology team for treatment. He and his wife are really doubtful that they need [facility] at this time. I am sending them our information and will contact them in 10 days to follow up. I encouraged them that we are available to them at any time a need arises.  Jeremiah Owens [facility] [place] [phone]

## 2015-09-19 ENCOUNTER — Ambulatory Visit (HOSPITAL_BASED_OUTPATIENT_CLINIC_OR_DEPARTMENT_OTHER): Payer: Medicare Other

## 2015-09-19 ENCOUNTER — Other Ambulatory Visit (HOSPITAL_BASED_OUTPATIENT_CLINIC_OR_DEPARTMENT_OTHER): Payer: Medicare Other

## 2015-09-19 VITALS — BP 119/59 | HR 69 | Temp 97.8°F | Resp 18

## 2015-09-19 DIAGNOSIS — Z5112 Encounter for antineoplastic immunotherapy: Secondary | ICD-10-CM

## 2015-09-19 DIAGNOSIS — C9000 Multiple myeloma not having achieved remission: Secondary | ICD-10-CM

## 2015-09-19 LAB — COMPREHENSIVE METABOLIC PANEL
ALT: 18 U/L (ref 0–55)
AST: 17 U/L (ref 5–34)
Albumin: 3.4 g/dL — ABNORMAL LOW (ref 3.5–5.0)
Alkaline Phosphatase: 60 U/L (ref 40–150)
Anion Gap: 7 mEq/L (ref 3–11)
BILIRUBIN TOTAL: 0.74 mg/dL (ref 0.20–1.20)
BUN: 19.1 mg/dL (ref 7.0–26.0)
CHLORIDE: 106 meq/L (ref 98–109)
CO2: 29 meq/L (ref 22–29)
Calcium: 8.8 mg/dL (ref 8.4–10.4)
Creatinine: 0.9 mg/dL (ref 0.7–1.3)
EGFR: 74 mL/min/{1.73_m2} — AB (ref >90)
GLUCOSE: 101 mg/dL (ref 70–140)
POTASSIUM: 3.9 meq/L (ref 3.5–5.1)
SODIUM: 142 meq/L (ref 136–145)
TOTAL PROTEIN: 5.5 g/dL — AB (ref 6.4–8.3)

## 2015-09-19 LAB — CBC WITH DIFFERENTIAL/PLATELET
BASO%: 0.5 % (ref 0.0–2.0)
BASOS ABS: 0 10*3/uL (ref 0.0–0.1)
EOS ABS: 0.1 10*3/uL (ref 0.0–0.5)
EOS%: 4.9 % (ref 0.0–7.0)
HCT: 35.3 % — ABNORMAL LOW (ref 38.4–49.9)
HGB: 11.8 g/dL — ABNORMAL LOW (ref 13.0–17.1)
LYMPH#: 0.4 10*3/uL — AB (ref 0.9–3.3)
LYMPH%: 18.4 % (ref 14.0–49.0)
MCH: 32.3 pg (ref 27.2–33.4)
MCHC: 33.4 g/dL (ref 32.0–36.0)
MCV: 96.7 fL (ref 79.3–98.0)
MONO#: 0.2 10*3/uL (ref 0.1–0.9)
MONO%: 10.7 % (ref 0.0–14.0)
NEUT#: 1.4 10*3/uL — ABNORMAL LOW (ref 1.5–6.5)
NEUT%: 65.5 % (ref 39.0–75.0)
Platelets: 81 10*3/uL — ABNORMAL LOW (ref 140–400)
RBC: 3.65 10*6/uL — AB (ref 4.20–5.82)
RDW: 15.7 % — AB (ref 11.0–14.6)
WBC: 2.1 10*3/uL — AB (ref 4.0–10.3)

## 2015-09-19 MED ORDER — ONDANSETRON 8 MG PO TBDP
8.0000 mg | ORAL_TABLET | Freq: Once | ORAL | Status: AC
  Administered 2015-09-19: 8 mg via ORAL
  Filled 2015-09-19: qty 1

## 2015-09-19 MED ORDER — BORTEZOMIB CHEMO SQ INJECTION 3.5 MG (2.5MG/ML)
1.3000 mg/m2 | Freq: Once | INTRAMUSCULAR | Status: AC
  Administered 2015-09-19: 2.5 mg via SUBCUTANEOUS
  Filled 2015-09-19: qty 2.5

## 2015-09-19 MED ORDER — ONDANSETRON HCL 8 MG PO TABS
ORAL_TABLET | ORAL | Status: AC
  Filled 2015-09-19: qty 1

## 2015-09-19 NOTE — Patient Instructions (Signed)
Bortezomib injection What is this medicine? BORTEZOMIB (bor TEZ oh mib) is a medicine that targets proteins in cancer cells and stops the cancer cells from growing. It is used to treat multiple myeloma and mantle-cell lymphoma. This medicine may be used for other purposes; ask your health care provider or pharmacist if you have questions. What should I tell my health care provider before I take this medicine? They need to know if you have any of these conditions: -diabetes -heart disease -irregular heartbeat -liver disease -on hemodialysis -low blood counts, like low white blood cells, platelets, or hemoglobin -peripheral neuropathy -taking medicine for blood pressure -an unusual or allergic reaction to bortezomib, mannitol, boron, other medicines, foods, dyes, or preservatives -pregnant or trying to get pregnant -breast-feeding How should I use this medicine? This medicine is for injection into a vein or for injection under the skin. It is given by a health care professional in a hospital or clinic setting. Talk to your pediatrician regarding the use of this medicine in children. Special care may be needed. Overdosage: If you think you have taken too much of this medicine contact a poison control center or emergency room at once. NOTE: This medicine is only for you. Do not share this medicine with others. What if I miss a dose? It is important not to miss your dose. Call your doctor or health care professional if you are unable to keep an appointment. What may interact with this medicine? This medicine may interact with the following medications: -ketoconazole -rifampin -ritonavir -St. John's Wort This list may not describe all possible interactions. Give your health care provider a list of all the medicines, herbs, non-prescription drugs, or dietary supplements you use. Also tell them if you smoke, drink alcohol, or use illegal drugs. Some items may interact with your medicine. What  should I watch for while using this medicine? Visit your doctor for checks on your progress. This drug may make you feel generally unwell. This is not uncommon, as chemotherapy can affect healthy cells as well as cancer cells. Report any side effects. Continue your course of treatment even though you feel ill unless your doctor tells you to stop. You may get drowsy or dizzy. Do not drive, use machinery, or do anything that needs mental alertness until you know how this medicine affects you. Do not stand or sit up quickly, especially if you are an older patient. This reduces the risk of dizzy or fainting spells. In some cases, you may be given additional medicines to help with side effects. Follow all directions for their use. Call your doctor or health care professional for advice if you get a fever, chills or sore throat, or other symptoms of a cold or flu. Do not treat yourself. This drug decreases your body's ability to fight infections. Try to avoid being around people who are sick. This medicine may increase your risk to bruise or bleed. Call your doctor or health care professional if you notice any unusual bleeding. You may need blood work done while you are taking this medicine. In some patients, this medicine may cause a serious brain infection that may cause death. If you have any problems seeing, thinking, speaking, walking, or standing, tell your doctor right away. If you cannot reach your doctor, urgently seek other source of medical care. Do not become pregnant while taking this medicine. Women should inform their doctor if they wish to become pregnant or think they might be pregnant. There is a potential for serious  side effects to an unborn child. Talk to your health care professional or pharmacist for more information. Do not breast-feed an infant while taking this medicine. Check with your doctor or health care professional if you get an attack of severe diarrhea, nausea and vomiting, or if  you sweat a lot. The loss of too much body fluid can make it dangerous for you to take this medicine. What side effects may I notice from receiving this medicine? Side effects that you should report to your doctor or health care professional as soon as possible: -allergic reactions like skin rash, itching or hives, swelling of the face, lips, or tongue -breathing problems -changes in hearing -changes in vision -fast, irregular heartbeat -feeling faint or lightheaded, falls -pain, tingling, numbness in the hands or feet -right upper belly pain -seizures -swelling of the ankles, feet, hands -unusual bleeding or bruising -unusually weak or tired -vomiting -yellowing of the eyes or skin Side effects that usually do not require medical attention (report to your doctor or health care professional if they continue or are bothersome): -changes in emotions or moods -constipation -diarrhea -loss of appetite -headache -irritation at site where injected -nausea This list may not describe all possible side effects. Call your doctor for medical advice about side effects. You may report side effects to FDA at 1-800-FDA-1088. Where should I keep my medicine? This drug is given in a hospital or clinic and will not be stored at home. NOTE: This sheet is a summary. It may not cover all possible information. If you have questions about this medicine, talk to your doctor, pharmacist, or health care provider.    2016, Elsevier/Gold Standard. (2014-09-19 14:47:04)

## 2015-09-19 NOTE — Progress Notes (Signed)
Okay to treat with today's labs (CBC) per Dr. Julien Nordmann.

## 2015-09-24 ENCOUNTER — Encounter: Payer: Self-pay | Admitting: *Deleted

## 2015-09-24 ENCOUNTER — Other Ambulatory Visit: Payer: Self-pay | Admitting: *Deleted

## 2015-09-24 NOTE — Patient Outreach (Signed)
I spoke to Mrs. Jeremiah Owens this morning. She said she had received the letter I sent along with the nondiscrimination clause. She said she did not however receive any brochure. I will request that the brocure and magnet be sent now. I have asked Mrs. Jeremiah Owens to call me if she has any questions, but I will not call her again.  Deloria Lair [facility] [place] [phone].

## 2015-09-25 ENCOUNTER — Telehealth: Payer: Self-pay | Admitting: Internal Medicine

## 2015-09-25 ENCOUNTER — Ambulatory Visit (HOSPITAL_BASED_OUTPATIENT_CLINIC_OR_DEPARTMENT_OTHER): Payer: Medicare Other

## 2015-09-25 ENCOUNTER — Encounter: Payer: Self-pay | Admitting: Internal Medicine

## 2015-09-25 ENCOUNTER — Other Ambulatory Visit (HOSPITAL_BASED_OUTPATIENT_CLINIC_OR_DEPARTMENT_OTHER): Payer: Medicare Other

## 2015-09-25 ENCOUNTER — Telehealth: Payer: Self-pay | Admitting: *Deleted

## 2015-09-25 ENCOUNTER — Ambulatory Visit (HOSPITAL_BASED_OUTPATIENT_CLINIC_OR_DEPARTMENT_OTHER): Payer: Medicare Other | Admitting: Internal Medicine

## 2015-09-25 VITALS — BP 119/59 | HR 72 | Temp 98.0°F | Resp 18 | Ht 69.0 in | Wt 160.4 lb

## 2015-09-25 DIAGNOSIS — C9000 Multiple myeloma not having achieved remission: Secondary | ICD-10-CM | POA: Diagnosis not present

## 2015-09-25 DIAGNOSIS — C419 Malignant neoplasm of bone and articular cartilage, unspecified: Secondary | ICD-10-CM

## 2015-09-25 DIAGNOSIS — D492 Neoplasm of unspecified behavior of bone, soft tissue, and skin: Secondary | ICD-10-CM

## 2015-09-25 DIAGNOSIS — Z5112 Encounter for antineoplastic immunotherapy: Secondary | ICD-10-CM | POA: Diagnosis present

## 2015-09-25 LAB — CBC WITH DIFFERENTIAL/PLATELET
BASO%: 0.3 % (ref 0.0–2.0)
BASOS ABS: 0 10*3/uL (ref 0.0–0.1)
EOS%: 2.1 % (ref 0.0–7.0)
Eosinophils Absolute: 0.1 10*3/uL (ref 0.0–0.5)
HEMATOCRIT: 38.8 % (ref 38.4–49.9)
HGB: 12.7 g/dL — ABNORMAL LOW (ref 13.0–17.1)
LYMPH#: 0.3 10*3/uL — AB (ref 0.9–3.3)
LYMPH%: 9.9 % — ABNORMAL LOW (ref 14.0–49.0)
MCH: 31.9 pg (ref 27.2–33.4)
MCHC: 32.7 g/dL (ref 32.0–36.0)
MCV: 97.4 fL (ref 79.3–98.0)
MONO#: 0.1 10*3/uL (ref 0.1–0.9)
MONO%: 4.4 % (ref 0.0–14.0)
NEUT#: 2.3 10*3/uL (ref 1.5–6.5)
NEUT%: 83.3 % — AB (ref 39.0–75.0)
Platelets: 78 10*3/uL — ABNORMAL LOW (ref 140–400)
RBC: 3.98 10*6/uL — AB (ref 4.20–5.82)
RDW: 16.1 % — ABNORMAL HIGH (ref 11.0–14.6)
WBC: 2.8 10*3/uL — ABNORMAL LOW (ref 4.0–10.3)

## 2015-09-25 LAB — COMPREHENSIVE METABOLIC PANEL
ALT: 17 U/L (ref 0–55)
AST: 17 U/L (ref 5–34)
Albumin: 3.6 g/dL (ref 3.5–5.0)
Alkaline Phosphatase: 56 U/L (ref 40–150)
Anion Gap: 8 mEq/L (ref 3–11)
BUN: 14.2 mg/dL (ref 7.0–26.0)
CHLORIDE: 104 meq/L (ref 98–109)
CO2: 29 meq/L (ref 22–29)
CREATININE: 0.9 mg/dL (ref 0.7–1.3)
Calcium: 9.1 mg/dL (ref 8.4–10.4)
EGFR: 78 mL/min/{1.73_m2} — ABNORMAL LOW (ref >90)
GLUCOSE: 75 mg/dL (ref 70–140)
POTASSIUM: 3.8 meq/L (ref 3.5–5.1)
SODIUM: 141 meq/L (ref 136–145)
Total Bilirubin: 0.66 mg/dL (ref 0.20–1.20)
Total Protein: 5.9 g/dL — ABNORMAL LOW (ref 6.4–8.3)

## 2015-09-25 LAB — LACTATE DEHYDROGENASE: LDH: 191 U/L (ref 125–245)

## 2015-09-25 MED ORDER — ONDANSETRON 8 MG PO TBDP
8.0000 mg | ORAL_TABLET | Freq: Once | ORAL | Status: AC
  Administered 2015-09-25: 8 mg via ORAL
  Filled 2015-09-25: qty 1

## 2015-09-25 MED ORDER — BORTEZOMIB CHEMO SQ INJECTION 3.5 MG (2.5MG/ML)
1.3000 mg/m2 | Freq: Once | INTRAMUSCULAR | Status: AC
  Administered 2015-09-25: 2.5 mg via SUBCUTANEOUS
  Filled 2015-09-25: qty 2.5

## 2015-09-25 NOTE — Progress Notes (Signed)
[place] [phone]   Fax:([phone]) [place] [place] [address]  DIAGNOSIS: Multiple myeloma diagnosed in November 2015  PRIOR THERAPY:  1) Status post Thoracic five laminectomy for epidural tumor resection, Thoracic 4-7 posterior lateral arthrodesis,  segmental pedicle screw fixation T4-T7 Globus instrumentation under the care of Dr. Christella Noa on 06/18/2014. 2) Systemic chemotherapy with Velcade 1.3 MG/M2 subcutaneously weekly in addition to Decadron 40 mg by mouth on weekly basis. Status post 11 cycles. 3) palliative radiotherapy to the right proximal humerus, shoulder and scapula under the care of Dr. Lisbeth Renshaw completed on 12/12/2014.  CURRENT THERAPY:  1) Systemic chemotherapy with Velcade 1.3 MG/M2 subcutaneously weekly, Decadron 40 mg by mouth weekly in addition to Revlimid 25 MG by mouth daily for 21 days every 4 weeks, status post 12 cycles. He is currently undergoing cycle #13. 2) Zometa 4 mg IV every month. First dose 11/01/2014.  INTERVAL HISTORY: Jeremiah Owens 82 y.o. male returns to the clinic today for follow-up visit accompanied by his wife. The patient is currently on treatment with weekly subcutaneous Velcade, Revlimid and Decadron status post 12 cycle and tolerating the treatment fairly well. He continues to have alternating episodes of diarrhea and constipation. He denied having any significant nausea or vomiting, no fever or chills. He denied having any significant weight loss or night sweats. He denied having any significant chest pain, shortness of breath, cough or hemoptysis. He had repeat CBC and comprehensive metabolic panel performed earlier today and he is here for evaluation and discussion of his lab results.  MEDICAL HISTORY: Past Medical History  Diagnosis Date  . Compression fracture   . Allergy   . Anxiety   . Thyroid disease   . S/P radiation therapy  08/21/14-09/04/14    T4-6 25Gy/67f  . S/P radiation therapy 11/29/14-12/12/14    rt prox humerus/shoulder/scapula 25Gy/188f . Encounter for antineoplastic chemotherapy 01/31/2015  . [place]  . [place] ([facility]    t[place]spine T-5  . [place] ([facility] [place]  . Skin cancer 1994    melanoma  right neck   . Multiple myeloma ([facility]  . Hearing loss   . [facility]    ALLERGIES:  is allergic to iodine.  MEDICATIONS:  Current Outpatient Prescriptions  Medication Sig Dispense Refill  . acyclovir (ZOVIRAX) 400 MG tablet Take 1 tablet (400 mg total) by mouth 2 (two) times daily. 60 tablet 2  . aspirin 81 MG EC tablet Take 81 mg by mouth daily.  0  . Calcium Carbonate-Vitamin D (CALTRATE 600+D PO) Take 600 mg by mouth 2 (two) times daily.    . cholecalciferol (VITAMIN D) 1000 UNITS tablet Take 1,000 Units by mouth at bedtime.     . Marland Kitchenexamethasone (DECADRON) 4 MG tablet TAKE 10 TABS EVERY WEEK, START WITH CHEMO 40 tablet 4  . furosemide (LASIX) 20 MG tablet Take 20 mg by mouth 2 (two) times daily.    . Marland KitchenLOR-CON 10 10 MEQ tablet Take 10 mEq by mouth daily.    . Marland Kitchenenalidomide (REVLIMID) 25 MG capsule Take 1 capsule (25 mg total) by mouth daily. Take one capsule ( 25 mg) by mouth for 21 days every 4 weeks-1/24/17Authorization number=  [id number] male 21 capsule 0  . levothyroxine (SYNTHROID, LEVOTHROID) 88 MCG tablet Take 88 mcg by mouth daily.    . methocarbamol (ROBAXIN) 500 MG tablet TAKE 1  TABLET (500 MG TOTAL) BY MOUTH EVERY 6 (SIX) HOURS AS NEEDED FOR MUSCLE SPASMS. 180 tablet 4  . Multiple Vitamins-Minerals (CENTRUM SILVER PO) Take 1 tablet by mouth daily.    Marland Kitchen omeprazole (PRILOSEC) 40 MG capsule Take 40 mg by mouth daily.    . ranitidine (ZANTAC) 150 MG tablet Take 150 mg by mouth daily as needed for heartburn.   11  . tamsulosin (FLOMAX) 0.4 MG CAPS capsule Take 1 capsule (0.4 mg total) by mouth at bedtime. 30 capsule 3  . warfarin (COUMADIN) 2 MG tablet Take 1 tablet (2 mg total)  by mouth daily. 90 tablet 4  . HYDROcodone-acetaminophen (NORCO) 5-325 MG per tablet Take 1 tablet by mouth every 6 (six) hours as needed for moderate pain. (Patient not taking: Reported on 08/29/2015) 60 tablet 0  . mupirocin ointment (BACTROBAN) 2 % Reported on 09/25/2015    . prochlorperazine (COMPAZINE) 10 MG tablet Take 10 mg by mouth daily. Reported on 09/25/2015  0  . sennosides-docusate sodium (SENOKOT-S) 8.6-50 MG tablet Take 1-2 tablets by mouth 2 (two) times daily as needed for constipation. Reported on 09/25/2015     No current facility-administered medications for this visit.   Facility-Administered Medications Ordered in Other Visits  Medication Dose Route Frequency Provider Last Rate Last Dose  . sodium chloride 0.9 % injection 10 mL  10 mL Intracatheter PRN Curt Bears, MD   10 mL at 08/08/15 1144    SURGICAL HISTORY:  Past Surgical History  Procedure Laterality Date  . Hernia repair    . Posterior cervical fusion/foraminotomy N/A 06/17/2014    Procedure: Thoracic five laminectomy for epidural tumor resection Thoracic 4-7 posterior lateral arthrodesis, segmental pedicle screw fixation.;  Surgeon: Ashok Pall, MD;  Location: [place] [facility];  Service: Neurosurgery;  Laterality: N/A;    REVIEW OF SYSTEMS:  A comprehensive review of systems was negative except for: Constitutional: positive for fatigue Gastrointestinal: positive for constipation and diarrhea Musculoskeletal: positive for muscle weakness   PHYSICAL EXAMINATION: General appearance: alert, cooperative, fatigued and no distress Head: Normocephalic, without obvious abnormality, atraumatic Neck: no adenopathy, no JVD, supple, symmetrical, trachea midline and thyroid not enlarged, symmetric, no tenderness/mass/nodules Lymph nodes: Cervical, supraclavicular, and axillary nodes normal. Resp: clear to auscultation bilaterally Back: symmetric, no curvature. ROM normal. No CVA tenderness. Cardio: regular rate and rhythm,  S1, S2 normal, no murmur, click, rub or gallop GI: soft, non-tender; bowel sounds normal; no masses,  no organomegaly Extremities: edema 2+ edema bilaterally with erythema on the legs Neurologic: Alert and oriented X 3, normal strength and tone. Normal symmetric reflexes. Normal coordination and gait Motor: grossly normal Weakness in the left foot  ECOG PERFORMANCE STATUS: 1 - Symptomatic but completely ambulatory  Blood pressure 119/59, pulse 72, temperature 98 F (36.7 C), temperature source Oral, resp. rate 18, height '5\' 9"'  (1.753 m), weight 160 lb 6.4 oz (72.757 kg), SpO2 100 %.  LABORATORY DATA: Lab Results  Component Value Date   WBC 2.8* 09/25/2015   HGB 12.7* 09/25/2015   HCT 38.8 09/25/2015   MCV 97.4 09/25/2015   PLT 78* 09/25/2015      Chemistry      Component Value Date/Time   NA 141 09/25/2015 1024   NA 134* 02/21/2015 0626   K 3.8 09/25/2015 1024   K 4.1 02/21/2015 0626   CL 101 02/21/2015 0626   CO2 29 09/25/2015 1024   CO2 26 02/21/2015 0626   BUN 14.2 09/25/2015 1024   BUN 12 02/21/2015  1747   CREATININE 0.9 09/25/2015 1024   CREATININE 0.84 02/21/2015 0626      Component Value Date/Time   CALCIUM 9.1 09/25/2015 1024   CALCIUM 7.9* 02/21/2015 0626   ALKPHOS 56 09/25/2015 1024   ALKPHOS 48 02/21/2015 0626   AST 17 09/25/2015 1024   AST 17 02/21/2015 0626   ALT 17 09/25/2015 1024   ALT 25 02/21/2015 0626   BILITOT 0.66 09/25/2015 1024   BILITOT 1.1 02/21/2015 0626       RADIOGRAPHIC STUDIES: No results found.  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 He completed treatment with subcutaneous Velcade and Decadron status post 11 weekly doses. This was discontinued secondary to disease progression. The patient was started on treatment with weekly subcutaneous Velcade, Revlimid for  21 days every 4 weeks in addition to Decadron 40 mg weekly status post 12 cycle and he is currently on treatment with cycle #13. He is tolerating it fairly well.  His CBC today showed persistent pancytopenia but reasonable enough to continue with his treatment. I discussed the lab results with the patient today. I recommended for the patient to continue his treatment today as a scheduled. He would come back for follow-up visit in 4 weeks for reevaluation after repeating myeloma panel. He will proceed with the Zometa infusion as scheduled. He was advised to call immediately if he has any concerning symptoms in the interval. The patient voices understanding of current disease status and treatment options and is in agreement with the current care plan.  All questions were answered. The patient knows to call the clinic with any problems, questions or concerns. We can certainly see the patient much sooner if necessary.  Disclaimer: This note was dictated with voice recognition software. Similar sounding words can inadvertently be transcribed and may not be corrected upon review.

## 2015-09-25 NOTE — Telephone Encounter (Signed)
Pt confirmed labs/ov per 02/21 POF, gave pt AVS and Calendar... KJ, sent msg to add chemo °

## 2015-09-25 NOTE — Progress Notes (Signed)
OK to tx with today's CBC values per Dr. Earlie Server.

## 2015-09-25 NOTE — Telephone Encounter (Signed)
Per staff message and POF I have scheduled appts. Advised scheduler of appts. JMW  

## 2015-09-25 NOTE — Patient Instructions (Signed)
[facility] Discharge Instructions for Patients Receiving Chemotherapy  Today you received the following chemotherapy agents Velcade. To help prevent nausea and vomiting after your treatment, we encourage you to take your nausea medication as directed.  If you develop nausea and vomiting that is not controlled by your nausea medication, call the clinic.   BELOW ARE SYMPTOMS THAT SHOULD BE REPORTED IMMEDIATELY:  *FEVER GREATER THAN 100.5 F  *CHILLS WITH OR WITHOUT FEVER  NAUSEA AND VOMITING THAT IS NOT CONTROLLED WITH YOUR NAUSEA MEDICATION  *UNUSUAL SHORTNESS OF BREATH  *UNUSUAL BRUISING OR BLEEDING  TENDERNESS IN MOUTH AND THROAT WITH OR WITHOUT PRESENCE OF ULCERS  *URINARY PROBLEMS  *BOWEL PROBLEMS  UNUSUAL RASH Items with * indicate a potential emergency and should be followed up as soon as possible.  Feel free to call the clinic you have any questions or concerns. The clinic phone number is [phone].  Please show the CHEMO ALERT CARD at check-in to the Emergency Department and triage nurse.    

## 2015-09-26 ENCOUNTER — Other Ambulatory Visit: Payer: Self-pay | Admitting: Medical Oncology

## 2015-09-26 ENCOUNTER — Other Ambulatory Visit: Payer: Medicare Other

## 2015-09-26 DIAGNOSIS — C9000 Multiple myeloma not having achieved remission: Secondary | ICD-10-CM

## 2015-09-26 MED ORDER — LENALIDOMIDE 25 MG PO CAPS: 25.0000 mg | ORAL_CAPSULE | Freq: Every day | ORAL | Status: DC

## 2015-09-28 ENCOUNTER — Telehealth: Payer: Self-pay | Admitting: Medical Oncology

## 2015-09-28 ENCOUNTER — Other Ambulatory Visit: Payer: Self-pay | Admitting: Medical Oncology

## 2015-09-28 DIAGNOSIS — C9000 Multiple myeloma not having achieved remission: Secondary | ICD-10-CM

## 2015-09-28 MED ORDER — DEXAMETHASONE 4 MG PO TABS: ORAL_TABLET | ORAL | Status: DC

## 2015-09-28 NOTE — Telephone Encounter (Signed)
Pt told [facility] he will call them next week for delivery.

## 2015-10-03 ENCOUNTER — Other Ambulatory Visit: Payer: Self-pay | Admitting: Internal Medicine

## 2015-10-03 ENCOUNTER — Other Ambulatory Visit (HOSPITAL_BASED_OUTPATIENT_CLINIC_OR_DEPARTMENT_OTHER): Payer: Medicare Other

## 2015-10-03 ENCOUNTER — Ambulatory Visit (HOSPITAL_BASED_OUTPATIENT_CLINIC_OR_DEPARTMENT_OTHER): Payer: Medicare Other

## 2015-10-03 ENCOUNTER — Other Ambulatory Visit: Payer: Self-pay | Admitting: Medical Oncology

## 2015-10-03 VITALS — BP 122/50 | HR 75 | Temp 99.0°F | Resp 18

## 2015-10-03 DIAGNOSIS — C9000 Multiple myeloma not having achieved remission: Secondary | ICD-10-CM

## 2015-10-03 DIAGNOSIS — Z5112 Encounter for antineoplastic immunotherapy: Secondary | ICD-10-CM | POA: Diagnosis present

## 2015-10-03 LAB — COMPREHENSIVE METABOLIC PANEL
ALBUMIN: 3.5 g/dL (ref 3.5–5.0)
ALT: 15 U/L (ref 0–55)
AST: 15 U/L (ref 5–34)
Alkaline Phosphatase: 52 U/L (ref 40–150)
Anion Gap: 8 mEq/L (ref 3–11)
BUN: 17.9 mg/dL (ref 7.0–26.0)
CALCIUM: 8.5 mg/dL (ref 8.4–10.4)
CHLORIDE: 104 meq/L (ref 98–109)
CO2: 28 mEq/L (ref 22–29)
CREATININE: 1 mg/dL (ref 0.7–1.3)
EGFR: 70 mL/min/{1.73_m2} — ABNORMAL LOW (ref >90)
GLUCOSE: 91 mg/dL (ref 70–140)
Potassium: 3.7 mEq/L (ref 3.5–5.1)
SODIUM: 139 meq/L (ref 136–145)
Total Bilirubin: 0.81 mg/dL (ref 0.20–1.20)
Total Protein: 5.5 g/dL — ABNORMAL LOW (ref 6.4–8.3)

## 2015-10-03 LAB — CBC WITH DIFFERENTIAL/PLATELET
BASO%: 0.6 % (ref 0.0–2.0)
Basophils Absolute: 0 10*3/uL (ref 0.0–0.1)
EOS ABS: 0.2 10*3/uL (ref 0.0–0.5)
EOS%: 6.1 % (ref 0.0–7.0)
HCT: 36 % — ABNORMAL LOW (ref 38.4–49.9)
HEMOGLOBIN: 12.1 g/dL — AB (ref 13.0–17.1)
LYMPH%: 12.9 % — AB (ref 14.0–49.0)
MCH: 32.3 pg (ref 27.2–33.4)
MCHC: 33.6 g/dL (ref 32.0–36.0)
MCV: 96.2 fL (ref 79.3–98.0)
MONO#: 0.5 10*3/uL (ref 0.1–0.9)
MONO%: 15 % — AB (ref 0.0–14.0)
NEUT%: 65.4 % (ref 39.0–75.0)
NEUTROS ABS: 2.1 10*3/uL (ref 1.5–6.5)
Platelets: 58 10*3/uL — ABNORMAL LOW (ref 140–400)
RBC: 3.75 10*6/uL — ABNORMAL LOW (ref 4.20–5.82)
RDW: 16.4 % — AB (ref 11.0–14.6)
WBC: 3.2 10*3/uL — AB (ref 4.0–10.3)
lymph#: 0.4 10*3/uL — ABNORMAL LOW (ref 0.9–3.3)

## 2015-10-03 MED ORDER — BORTEZOMIB CHEMO SQ INJECTION 3.5 MG (2.5MG/ML)
1.3000 mg/m2 | Freq: Once | INTRAMUSCULAR | Status: AC
  Administered 2015-10-03: 2.5 mg via SUBCUTANEOUS
  Filled 2015-10-03: qty 2.5

## 2015-10-03 MED ORDER — ZOLEDRONIC ACID 4 MG/100ML IV SOLN
4.0000 mg | Freq: Once | INTRAVENOUS | Status: AC
  Administered 2015-10-03: 4 mg via INTRAVENOUS
  Filled 2015-10-03: qty 100

## 2015-10-03 MED ORDER — SODIUM CHLORIDE 0.9 % IV SOLN
Freq: Once | INTRAVENOUS | Status: AC
  Administered 2015-10-03: 11:00:00 via INTRAVENOUS

## 2015-10-03 MED ORDER — ONDANSETRON 8 MG PO TBDP
8.0000 mg | ORAL_TABLET | Freq: Once | ORAL | Status: AC
  Administered 2015-10-03: 8 mg via ORAL
  Filled 2015-10-03: qty 1

## 2015-10-03 MED ORDER — ACYCLOVIR 400 MG PO TABS: 400.0000 mg | ORAL_TABLET | Freq: Two times a day (BID) | ORAL | Status: DC

## 2015-10-03 NOTE — Progress Notes (Signed)
OK to treat with Velcade and Zometa today with PLT 58 per Dr. Earlie Server.

## 2015-10-03 NOTE — Patient Instructions (Signed)
[facility] Discharge Instructions for Patients Receiving Chemotherapy  Today you received the following chemotherapy agents Velcade and Zometa  To help prevent nausea and vomiting after your treatment, we encourage you to take your nausea medication as directed    If you develop nausea and vomiting that is not controlled by your nausea medication, call the clinic.   BELOW ARE SYMPTOMS THAT SHOULD BE REPORTED IMMEDIATELY:  *FEVER GREATER THAN 100.5 F  *CHILLS WITH OR WITHOUT FEVER  NAUSEA AND VOMITING THAT IS NOT CONTROLLED WITH YOUR NAUSEA MEDICATION  *UNUSUAL SHORTNESS OF BREATH  *UNUSUAL BRUISING OR BLEEDING  TENDERNESS IN MOUTH AND THROAT WITH OR WITHOUT PRESENCE OF ULCERS  *URINARY PROBLEMS  *BOWEL PROBLEMS  UNUSUAL RASH Items with * indicate a potential emergency and should be followed up as soon as possible.  Feel free to call the clinic you have any questions or concerns. The clinic phone number is [phone].  Please show the CHEMO ALERT CARD at check-in to the Emergency Department and triage nurse.   

## 2015-10-08 ENCOUNTER — Encounter: Payer: Medicare Other | Attending: Physical Medicine & Rehabilitation | Admitting: Physical Medicine & Rehabilitation

## 2015-10-08 ENCOUNTER — Encounter: Payer: Self-pay | Admitting: Physical Medicine & Rehabilitation

## 2015-10-08 ENCOUNTER — Encounter: Payer: Self-pay | Admitting: *Deleted

## 2015-10-08 VITALS — BP 126/66 | HR 77

## 2015-10-08 DIAGNOSIS — C9000 Multiple myeloma not having achieved remission: Secondary | ICD-10-CM | POA: Diagnosis not present

## 2015-10-08 DIAGNOSIS — G8220 Paraplegia, unspecified: Secondary | ICD-10-CM | POA: Insufficient documentation

## 2015-10-08 DIAGNOSIS — R4701 Aphasia: Secondary | ICD-10-CM | POA: Diagnosis not present

## 2015-10-08 DIAGNOSIS — I6381 Other cerebral infarction due to occlusion or stenosis of small artery: Secondary | ICD-10-CM

## 2015-10-08 DIAGNOSIS — I639 Cerebral infarction, unspecified: Secondary | ICD-10-CM

## 2015-10-08 DIAGNOSIS — R269 Unspecified abnormalities of gait and mobility: Secondary | ICD-10-CM | POA: Diagnosis not present

## 2015-10-08 NOTE — Progress Notes (Signed)
Subjective:    Patient ID: Jeremiah Owens, male    DOB: [birth year], 82 y.o.   MRN: [id number]  HPI   Mr. Fullbright is here in follow of his left basal ganglia infarct and previous thoracic cord injury. He still struggles with constipation. He will go several days without moving his bowels and then he starts with a small movement which gradually becomes larger movements and ultimately is associated with diarrhea.   He has had some coughing at times with thins. He sometimes forgets to take his time and use his full precautions.  He has driven on occasion locally during the morning without issues.    HE is using the walker for longer dx outside the home. He isn't always using it to walk in the yard.   Pain Inventory Average Pain no pain Pain Right Now 0 My pain is no pain  In the last 24 hours, has pain interfered with the following? General activity 0 Relation with others 0 Enjoyment of life 0 What TIME of day is your pain at its worst? no pain Sleep (in general) no pain  Pain is worse with: no pain Pain improves with: no pain Relief from Meds: no pain  Mobility use a walker do you drive?  yes  Function retired  Neuro/Psych bowel control problems  Prior Studies Any changes since last visit?  no  Physicians involved in your care Any changes since last visit?  no   Family History  Problem Relation Age of Onset  . Cancer Mother     breast  . Stroke Mother    Social History   Social History  . Marital Status: Married    Spouse Name: N/A  . Number of Children: N/A  . Years of Education: N/A   Social History Main Topics  . Smoking status: Never Smoker   . Smokeless tobacco: None  . Alcohol Use: No  . Drug Use: No  . Sexual Activity: Not Asked   Other Topics Concern  . None   Social History Narrative   Past Surgical History  Procedure Laterality Date  . Hernia repair    . Posterior cervical fusion/foraminotomy N/A 06/17/2014    Procedure: Thoracic  five laminectomy for epidural tumor resection Thoracic 4-7 posterior lateral arthrodesis, segmental pedicle screw fixation.;  Surgeon: Ashok Pall, MD;  Location: [place] [facility];  Service: Neurosurgery;  Laterality: N/A;   Past Medical History  Diagnosis Date  . Compression fracture   . Allergy   . Anxiety   . Thyroid disease   . S/P radiation therapy 08/21/14-09/04/14    T4-6 25Gy/37f  . S/P radiation therapy 11/29/14-12/12/14    rt prox humerus/shoulder/scapula 25Gy/132f . Encounter for antineoplastic chemotherapy 01/31/2015  . Melanoma ([facility]  . Bone cancer ([facility]    t_spine T-5  . [place] [facility] ([facility]  . Skin cancer 1994    melanoma  right neck   . Multiple myeloma (HCE[facility]  . Hearing loss   . Stroke ([facility]   BP 126/66 mmHg  Pulse 77  SpO2 99%  Opioid Risk Score:   Fall Risk Score:  `1  Depression screen PHQ 2/9  Depression screen [facility]/9 04/10/2015 11/07/2014  Decreased Interest 0 0  Down, Depressed, Hopeless 0 0  PHQ - 2 Score 0 0  Altered sleeping 1 0  Tired, decreased energy 1 2  Change in appetite 0 0  Feeling bad or failure about yourself  0 0  Trouble concentrating  0 1  Moving slowly or fidgety/restless 0 0  Suicidal thoughts 0 0  PHQ-9 Score 2 3     Review of Systems  Hematological: Bruises/bleeds easily.  All other systems reviewed and are negative.      Objective:   Physical Exam  Constitutional: He is oriented to person, place, and time.  HENT:  Eyes: EOM are normal.  Neck: Normal range of motion. Neck supple. No thyromegaly present.  Cardiovascular: Normal rate and regular rhythm. no murmurs, rubs  Respiratory: Effort normal and breath sounds normal. No respiratory distress.  GI: Soft. Bowel sounds are normal. He exhibits no distension.  Neurological: He is alert and oriented to person, place, and time. He has ongoing word finding deficits, but is generally able to convey his thought and intentions with extra time.  Speech is intelligible.  Mild right central 7. He is oriented to person place, date. RUE: 4/5 prox to distal. Decreased FMC RUE but improved. RLE: 4 hf, 4/5 ke, adf/apf. LLE: 4+/5. Decreased proprioception in both legs (chronic). Still ambulates with mild posterior lean and slight shuffle. He shuffles less with the RW. Head still forward with sitting/standing.  Skin: Skin is warm and dry.  Psychiatric: He has a normal mood and affect. His behavior is normal    Assessment/Plan:  1. Functional deficits secondary to left basal ganglia infarct  -has made nice improvements  -encouraged continued use of his right hand for fine motor tasks  -discussed language exercises for at home  -continue HEP  2. History of multiple myeloma with thoracic spinal metastasis.  -mgt per Dr. Earlie Server  9. Aspiration pneumonia/dysphagia:  -revisited swallowing techniques and precautions. Needs to take his time. He is still at risk for aspirating thins as evidenced by his coughing of thins - Lungs remain clear  10. BPH:  Continue Flomax  Follow up in 6 months. Fifteen minutes of face to face patient care time were spent during this visit. All questions were encouraged and answered.

## 2015-10-08 NOTE — Patient Instructions (Signed)
BOWEL PROGRAM  TRY SENOKOT-S 1 TAB AT NIGHT.  THE FOLLOWING MORNING, TRY TO EAT BEFORE YOU GO TO THE BATHROOM CONSIDER TRYING DIGITAL STIMULATION OR A SUPPOSITORY IF YOU STILL AREN'T ABLE TO HAVE A BM.  YOU CAN INCREASE THE SENOKOT S TO 2 TABS IF NEEDED. IF IT'S TOO MUCH, YOU MAY WANT TO TRY PRUNES OR SOME OTHER FRUIT AT NIGHT.  PLEASE CALL ME WITH ANY PROBLEMS OR QUESTIONS [phone]).

## 2015-10-10 ENCOUNTER — Other Ambulatory Visit (HOSPITAL_BASED_OUTPATIENT_CLINIC_OR_DEPARTMENT_OTHER): Payer: Medicare Other

## 2015-10-10 ENCOUNTER — Ambulatory Visit (HOSPITAL_BASED_OUTPATIENT_CLINIC_OR_DEPARTMENT_OTHER): Payer: Medicare Other

## 2015-10-10 DIAGNOSIS — C9000 Multiple myeloma not having achieved remission: Secondary | ICD-10-CM | POA: Diagnosis present

## 2015-10-10 DIAGNOSIS — Z5112 Encounter for antineoplastic immunotherapy: Secondary | ICD-10-CM

## 2015-10-10 LAB — COMPREHENSIVE METABOLIC PANEL
ALBUMIN: 3.4 g/dL — AB (ref 3.5–5.0)
ALK PHOS: 57 U/L (ref 40–150)
ALT: 14 U/L (ref 0–55)
ANION GAP: 7 meq/L (ref 3–11)
AST: 14 U/L (ref 5–34)
BILIRUBIN TOTAL: 0.76 mg/dL (ref 0.20–1.20)
BUN: 18.3 mg/dL (ref 7.0–26.0)
CALCIUM: 8.5 mg/dL (ref 8.4–10.4)
CHLORIDE: 104 meq/L (ref 98–109)
CO2: 30 mEq/L — ABNORMAL HIGH (ref 22–29)
CREATININE: 0.9 mg/dL (ref 0.7–1.3)
EGFR: 74 mL/min/{1.73_m2} — ABNORMAL LOW (ref >90)
Glucose: 91 mg/dl (ref 70–140)
Potassium: 3.9 mEq/L (ref 3.5–5.1)
Sodium: 141 mEq/L (ref 136–145)
Total Protein: 5.5 g/dL — ABNORMAL LOW (ref 6.4–8.3)

## 2015-10-10 LAB — CBC WITH DIFFERENTIAL/PLATELET
BASO%: 0 % (ref 0.0–2.0)
BASOS ABS: 0 10*3/uL (ref 0.0–0.1)
EOS%: 11.2 % — ABNORMAL HIGH (ref 0.0–7.0)
Eosinophils Absolute: 0.3 10*3/uL (ref 0.0–0.5)
HEMATOCRIT: 34.3 % — AB (ref 38.4–49.9)
HEMOGLOBIN: 11.6 g/dL — AB (ref 13.0–17.1)
LYMPH#: 0.5 10*3/uL — AB (ref 0.9–3.3)
LYMPH%: 20.7 % (ref 14.0–49.0)
MCH: 32.5 pg (ref 27.2–33.4)
MCHC: 33.8 g/dL (ref 32.0–36.0)
MCV: 96.1 fL (ref 79.3–98.0)
MONO#: 0.4 10*3/uL (ref 0.1–0.9)
MONO%: 15.8 % — ABNORMAL HIGH (ref 0.0–14.0)
NEUT#: 1.3 10*3/uL — ABNORMAL LOW (ref 1.5–6.5)
NEUT%: 52.3 % (ref 39.0–75.0)
PLATELETS: 70 10*3/uL — AB (ref 140–400)
RBC: 3.57 10*6/uL — ABNORMAL LOW (ref 4.20–5.82)
RDW: 15.6 % — ABNORMAL HIGH (ref 11.0–14.6)
WBC: 2.4 10*3/uL — ABNORMAL LOW (ref 4.0–10.3)

## 2015-10-10 MED ORDER — ONDANSETRON HCL 8 MG PO TABS
ORAL_TABLET | ORAL | Status: AC
  Filled 2015-10-10: qty 1

## 2015-10-10 MED ORDER — ONDANSETRON 8 MG PO TBDP
8.0000 mg | ORAL_TABLET | Freq: Once | ORAL | Status: AC
  Administered 2015-10-10: 8 mg via ORAL
  Filled 2015-10-10: qty 1

## 2015-10-10 MED ORDER — BORTEZOMIB CHEMO SQ INJECTION 3.5 MG (2.5MG/ML)
1.3000 mg/m2 | Freq: Once | INTRAMUSCULAR | Status: AC
  Administered 2015-10-10: 2.5 mg via SUBCUTANEOUS
  Filled 2015-10-10: qty 2.5

## 2015-10-10 NOTE — Progress Notes (Signed)
Reviewed lab results with Dr. Alen Blew (ANC 1.3 and Plt 70). Dr. Alen Blew advises ok to treat.

## 2015-10-10 NOTE — Patient Instructions (Signed)
[facility] Discharge Instructions for Patients Receiving Chemotherapy  Today you received the following chemotherapy agents:  Velcade  To help prevent nausea and vomiting after your treatment, we encourage you to take your nausea medication as prescribed.   If you develop nausea and vomiting that is not controlled by your nausea medication, call the clinic.   BELOW ARE SYMPTOMS THAT SHOULD BE REPORTED IMMEDIATELY:  *FEVER GREATER THAN 100.5 F  *CHILLS WITH OR WITHOUT FEVER  NAUSEA AND VOMITING THAT IS NOT CONTROLLED WITH YOUR NAUSEA MEDICATION  *UNUSUAL SHORTNESS OF BREATH  *UNUSUAL BRUISING OR BLEEDING  TENDERNESS IN MOUTH AND THROAT WITH OR WITHOUT PRESENCE OF ULCERS  *URINARY PROBLEMS  *BOWEL PROBLEMS  UNUSUAL RASH Items with * indicate a potential emergency and should be followed up as soon as possible.  Feel free to call the clinic you have any questions or concerns. The clinic phone number is [phone].  Please show the CHEMO ALERT CARD at check-in to the Emergency Department and triage nurse.   

## 2015-10-17 ENCOUNTER — Other Ambulatory Visit (HOSPITAL_BASED_OUTPATIENT_CLINIC_OR_DEPARTMENT_OTHER): Payer: Medicare Other

## 2015-10-17 ENCOUNTER — Ambulatory Visit (HOSPITAL_BASED_OUTPATIENT_CLINIC_OR_DEPARTMENT_OTHER): Payer: Medicare Other

## 2015-10-17 DIAGNOSIS — C419 Malignant neoplasm of bone and articular cartilage, unspecified: Secondary | ICD-10-CM | POA: Diagnosis not present

## 2015-10-17 DIAGNOSIS — C9000 Multiple myeloma not having achieved remission: Secondary | ICD-10-CM

## 2015-10-17 DIAGNOSIS — Z5112 Encounter for antineoplastic immunotherapy: Secondary | ICD-10-CM

## 2015-10-17 DIAGNOSIS — IMO0001 Reserved for inherently not codable concepts without codable children: Secondary | ICD-10-CM

## 2015-10-17 DIAGNOSIS — D492 Neoplasm of unspecified behavior of bone, soft tissue, and skin: Secondary | ICD-10-CM

## 2015-10-17 LAB — COMPREHENSIVE METABOLIC PANEL
ALBUMIN: 3.5 g/dL (ref 3.5–5.0)
ALK PHOS: 54 U/L (ref 40–150)
ALT: 21 U/L (ref 0–55)
ANION GAP: 6 meq/L (ref 3–11)
AST: 17 U/L (ref 5–34)
BILIRUBIN TOTAL: 0.6 mg/dL (ref 0.20–1.20)
BUN: 20.2 mg/dL (ref 7.0–26.0)
CO2: 31 meq/L — AB (ref 22–29)
CREATININE: 1 mg/dL (ref 0.7–1.3)
Calcium: 8.8 mg/dL (ref 8.4–10.4)
Chloride: 105 mEq/L (ref 98–109)
EGFR: 71 mL/min/{1.73_m2} — AB (ref >90)
Glucose: 81 mg/dl (ref 70–140)
Potassium: 4.1 mEq/L (ref 3.5–5.1)
Sodium: 142 mEq/L (ref 136–145)
TOTAL PROTEIN: 5.6 g/dL — AB (ref 6.4–8.3)

## 2015-10-17 LAB — CBC WITH DIFFERENTIAL/PLATELET
BASO%: 0.4 % (ref 0.0–2.0)
Basophils Absolute: 0 10*3/uL (ref 0.0–0.1)
EOS ABS: 0.1 10*3/uL (ref 0.0–0.5)
EOS%: 4.8 % (ref 0.0–7.0)
HEMATOCRIT: 35.7 % — AB (ref 38.4–49.9)
HEMOGLOBIN: 12 g/dL — AB (ref 13.0–17.1)
LYMPH#: 0.5 10*3/uL — AB (ref 0.9–3.3)
LYMPH%: 17 % (ref 14.0–49.0)
MCH: 32.4 pg (ref 27.2–33.4)
MCHC: 33.6 g/dL (ref 32.0–36.0)
MCV: 96.5 fL (ref 79.3–98.0)
MONO#: 0.3 10*3/uL (ref 0.1–0.9)
MONO%: 11.4 % (ref 0.0–14.0)
NEUT%: 66.4 % (ref 39.0–75.0)
NEUTROS ABS: 1.8 10*3/uL (ref 1.5–6.5)
PLATELETS: 81 10*3/uL — AB (ref 140–400)
RBC: 3.7 10*6/uL — ABNORMAL LOW (ref 4.20–5.82)
RDW: 15.8 % — AB (ref 11.0–14.6)
WBC: 2.7 10*3/uL — AB (ref 4.0–10.3)

## 2015-10-17 LAB — LACTATE DEHYDROGENASE: LDH: 175 U/L (ref 125–245)

## 2015-10-17 MED ORDER — ONDANSETRON 8 MG PO TBDP
8.0000 mg | ORAL_TABLET | Freq: Once | ORAL | Status: AC
  Administered 2015-10-17: 8 mg via ORAL
  Filled 2015-10-17: qty 1

## 2015-10-17 MED ORDER — BORTEZOMIB CHEMO SQ INJECTION 3.5 MG (2.5MG/ML)
1.3000 mg/m2 | Freq: Once | INTRAMUSCULAR | Status: AC
  Administered 2015-10-17: 2.5 mg via SUBCUTANEOUS
  Filled 2015-10-17: qty 2.5

## 2015-10-17 NOTE — Progress Notes (Signed)
CBC/CMET reviewed by MD. OK to treat

## 2015-10-17 NOTE — Patient Instructions (Signed)
[facility] Discharge Instructions for Patients Receiving Chemotherapy  Today you received the following chemotherapy agents:  Velcade  To help prevent nausea and vomiting after your treatment, we encourage you to take your nausea medication as prescribed.   If you develop nausea and vomiting that is not controlled by your nausea medication, call the clinic.   BELOW ARE SYMPTOMS THAT SHOULD BE REPORTED IMMEDIATELY:  *FEVER GREATER THAN 100.5 F  *CHILLS WITH OR WITHOUT FEVER  NAUSEA AND VOMITING THAT IS NOT CONTROLLED WITH YOUR NAUSEA MEDICATION  *UNUSUAL SHORTNESS OF BREATH  *UNUSUAL BRUISING OR BLEEDING  TENDERNESS IN MOUTH AND THROAT WITH OR WITHOUT PRESENCE OF ULCERS  *URINARY PROBLEMS  *BOWEL PROBLEMS  UNUSUAL RASH Items with * indicate a potential emergency and should be followed up as soon as possible.  Feel free to call the clinic you have any questions or concerns. The clinic phone number is [phone].  Please show the CHEMO ALERT CARD at check-in to the Emergency Department and triage nurse.   

## 2015-10-18 ENCOUNTER — Other Ambulatory Visit: Payer: Self-pay | Admitting: Medical Oncology

## 2015-10-18 DIAGNOSIS — C9000 Multiple myeloma not having achieved remission: Secondary | ICD-10-CM

## 2015-10-18 LAB — BETA 2 MICROGLOBULIN, SERUM: BETA 2: 2.4 mg/L (ref 0.6–2.4)

## 2015-10-18 LAB — KAPPA/LAMBDA LIGHT CHAINS
IG KAPPA FREE LIGHT CHAIN: 14.81 mg/L (ref 3.30–19.40)
Ig Lambda Free Light Chain: 8.48 mg/L (ref 5.71–26.30)
Kappa/Lambda FluidC Ratio: 1.75 — ABNORMAL HIGH (ref 0.26–1.65)

## 2015-10-18 LAB — IGG, IGA, IGM
IGG (IMMUNOGLOBIN G), SERUM: 369 mg/dL — AB (ref 700–1600)
IgA, Qn, Serum: 59 mg/dL — ABNORMAL LOW (ref 61–437)
IgM, Qn, Serum: 18 mg/dL (ref 15–143)

## 2015-10-18 MED ORDER — LENALIDOMIDE 25 MG PO CAPS: 25.0000 mg | ORAL_CAPSULE | Freq: Every day | ORAL | Status: DC

## 2015-10-18 NOTE — Progress Notes (Unsigned)
revlimid rx to [facility] to sign.

## 2015-10-22 ENCOUNTER — Other Ambulatory Visit: Payer: Self-pay | Admitting: Medical Oncology

## 2015-10-22 DIAGNOSIS — C9000 Multiple myeloma not having achieved remission: Secondary | ICD-10-CM

## 2015-10-22 MED ORDER — LENALIDOMIDE 25 MG PO CAPS: 25.0000 mg | ORAL_CAPSULE | Freq: Every day | ORAL | Status: DC

## 2015-10-23 ENCOUNTER — Telehealth: Payer: Self-pay | Admitting: Medical Oncology

## 2015-10-23 NOTE — Telephone Encounter (Signed)
revlimid rx received and in process of being refilled.

## 2015-10-24 ENCOUNTER — Other Ambulatory Visit (HOSPITAL_BASED_OUTPATIENT_CLINIC_OR_DEPARTMENT_OTHER): Payer: Medicare Other

## 2015-10-24 ENCOUNTER — Telehealth: Payer: Self-pay | Admitting: Internal Medicine

## 2015-10-24 ENCOUNTER — Ambulatory Visit (HOSPITAL_BASED_OUTPATIENT_CLINIC_OR_DEPARTMENT_OTHER): Payer: Medicare Other | Admitting: Internal Medicine

## 2015-10-24 ENCOUNTER — Encounter: Payer: Self-pay | Admitting: Internal Medicine

## 2015-10-24 ENCOUNTER — Telehealth: Payer: Self-pay | Admitting: Medical Oncology

## 2015-10-24 ENCOUNTER — Other Ambulatory Visit: Payer: Self-pay | Admitting: Medical Oncology

## 2015-10-24 ENCOUNTER — Telehealth: Payer: Self-pay | Admitting: *Deleted

## 2015-10-24 ENCOUNTER — Ambulatory Visit: Payer: Medicare Other

## 2015-10-24 VITALS — BP 126/81 | HR 67 | Temp 97.6°F | Resp 17 | Ht 69.0 in | Wt 157.4 lb

## 2015-10-24 DIAGNOSIS — R197 Diarrhea, unspecified: Secondary | ICD-10-CM | POA: Diagnosis not present

## 2015-10-24 DIAGNOSIS — C9000 Multiple myeloma not having achieved remission: Secondary | ICD-10-CM | POA: Insufficient documentation

## 2015-10-24 DIAGNOSIS — Z5111 Encounter for antineoplastic chemotherapy: Secondary | ICD-10-CM

## 2015-10-24 DIAGNOSIS — R5383 Other fatigue: Secondary | ICD-10-CM | POA: Diagnosis not present

## 2015-10-24 DIAGNOSIS — D492 Neoplasm of unspecified behavior of bone, soft tissue, and skin: Secondary | ICD-10-CM

## 2015-10-24 LAB — CBC WITH DIFFERENTIAL/PLATELET
BASO%: 0.6 % (ref 0.0–2.0)
BASOS ABS: 0 10*3/uL (ref 0.0–0.1)
EOS ABS: 0 10*3/uL (ref 0.0–0.5)
EOS%: 1.9 % (ref 0.0–7.0)
HCT: 37.2 % — ABNORMAL LOW (ref 38.4–49.9)
HGB: 12.2 g/dL — ABNORMAL LOW (ref 13.0–17.1)
LYMPH%: 11 % — AB (ref 14.0–49.0)
MCH: 32 pg (ref 27.2–33.4)
MCHC: 32.8 g/dL (ref 32.0–36.0)
MCV: 97.7 fL (ref 79.3–98.0)
MONO#: 0.1 10*3/uL (ref 0.1–0.9)
MONO%: 3.3 % (ref 0.0–14.0)
NEUT#: 2.2 10*3/uL (ref 1.5–6.5)
NEUT%: 83.2 % — AB (ref 39.0–75.0)
Platelets: 86 10*3/uL — ABNORMAL LOW (ref 140–400)
RBC: 3.8 10*6/uL — AB (ref 4.20–5.82)
RDW: 16.9 % — ABNORMAL HIGH (ref 11.0–14.6)
WBC: 2.6 10*3/uL — AB (ref 4.0–10.3)
lymph#: 0.3 10*3/uL — ABNORMAL LOW (ref 0.9–3.3)

## 2015-10-24 LAB — COMPREHENSIVE METABOLIC PANEL
ALBUMIN: 3.7 g/dL (ref 3.5–5.0)
ALT: 17 U/L (ref 0–55)
ANION GAP: 8 meq/L (ref 3–11)
AST: 15 U/L (ref 5–34)
Alkaline Phosphatase: 54 U/L (ref 40–150)
BUN: 15 mg/dL (ref 7.0–26.0)
CO2: 32 mEq/L — ABNORMAL HIGH (ref 22–29)
Calcium: 9.1 mg/dL (ref 8.4–10.4)
Chloride: 101 mEq/L (ref 98–109)
Creatinine: 1 mg/dL (ref 0.7–1.3)
EGFR: 66 mL/min/{1.73_m2} — AB (ref >90)
GLUCOSE: 104 mg/dL (ref 70–140)
POTASSIUM: 3.8 meq/L (ref 3.5–5.1)
SODIUM: 140 meq/L (ref 136–145)
Total Bilirubin: 0.65 mg/dL (ref 0.20–1.20)
Total Protein: 6 g/dL — ABNORMAL LOW (ref 6.4–8.3)

## 2015-10-24 NOTE — Progress Notes (Signed)
[place] [phone]   Fax:([phone]) [place] [address] [employer] [address] [place] [address]  DIAGNOSIS: Multiple myeloma diagnosed in November 2015  PRIOR THERAPY:  1) Status post Thoracic five laminectomy for epidural tumor resection, Thoracic 4-7 posterior lateral arthrodesis,  segmental pedicle screw fixation T4-T7 Globus instrumentation under the care of Dr. Christella Noa on 06/18/2014. 2) Systemic chemotherapy with Velcade 1.3 MG/M2 subcutaneously weekly in addition to Decadron 40 mg by mouth on weekly basis. Status post 11 cycles. 3) palliative radiotherapy to the right proximal humerus, shoulder and scapula under the care of Dr. Lisbeth Renshaw completed on 12/12/2014.  CURRENT THERAPY:  1) Systemic chemotherapy with Velcade 1.3 MG/M2 subcutaneously weekly, Decadron 40 mg by mouth weekly in addition to Revlimid 25 MG by mouth daily for 21 days every 4 weeks, status post 13 cycles. He is currently undergoing cycle #14. 2) Zometa 4 mg IV every 12 weeks. First dose 11/01/2014.  INTERVAL HISTORY: Bronte Kropf 82 y.o. male returns to the clinic today for follow-up visit accompanied by his wife. The patient is currently on treatment with weekly subcutaneous Velcade, Revlimid and Decadron status post 13 cycle and tolerating the treatment fairly well except for several episodes of diarrhea. He also has increasing fatigue. He denied having any significant nausea or vomiting, no fever or chills. He denied having any significant weight loss or night sweats. He denied having any significant chest pain, shortness of breath, cough or hemoptysis. He had repeat myeloma panel performed earlier today and he is here for evaluation and discussion of his lab results.  MEDICAL HISTORY: Past Medical History  Diagnosis Date  . Compression fracture   . Allergy   . Anxiety   . Thyroid disease   . S/P radiation therapy 08/21/14-09/04/14    T4-6  25Gy/40f  . S/P radiation therapy 11/29/14-12/12/14    rt prox humerus/shoulder/scapula 25Gy/121f . Encounter for antineoplastic chemotherapy 01/31/2015  . [facility] [facility]  . [place]    t[place]spine T-5  . [place]  . Skin cancer 1994    melanoma  right neck   . Multiple myeloma ([facility]  . [facility] loss   . [facility]    ALLERGIES:  is allergic to iodine.  MEDICATIONS:  Current Outpatient Prescriptions  Medication Sig Dispense Refill  . acyclovir (ZOVIRAX) 400 MG tablet Take 1 tablet (400 mg total) by mouth 2 (two) times daily. 60 tablet 2  . aspirin 81 MG EC tablet Take 81 mg by mouth daily.  0  . Calcium Carbonate-Vitamin D (CALTRATE 600+D PO) Take 600 mg by mouth 2 (two) times daily.    . cholecalciferol (VITAMIN D) 1000 UNITS tablet Take 1,000 Units by mouth at bedtime.     . Marland Kitchenexamethasone (DECADRON) 4 MG tablet TAKE 10 TABS EVERY WEEK, START WITH CHEMO 40 tablet 4  . furosemide (LASIX) 20 MG tablet Take 20 mg by mouth 2 (two) times daily.    . Marland KitchenYDROcodone-acetaminophen (NORCO) 5-325 MG per tablet Take 1 tablet by mouth every 6 (six) hours as needed for moderate pain. 60 tablet 0  . KLOR-CON 10 10 MEQ tablet Take 10 mEq by mouth daily.    . Marland Kitchenenalidomide (REVLIMID) 25 MG capsule Take 1 capsule (25 mg total) by mouth daily. Take one capsule ( 25 mg) by mouth for 21 days every 4 weeks- 21 capsule 0  . levothyroxine (SYNTHROID, LEVOTHROID) 88  MCG tablet Take 88 mcg by mouth daily.    Marland Kitchen loperamide (IMODIUM) 1 MG/5ML solution Take 2 mg by mouth as needed for diarrhea or loose stools.    . methocarbamol (ROBAXIN) 500 MG tablet TAKE 1 TABLET (500 MG TOTAL) BY MOUTH EVERY 6 (SIX) HOURS AS NEEDED FOR MUSCLE SPASMS. 180 tablet 4  . Multiple Vitamins-Minerals (CENTRUM SILVER PO) Take 1 tablet by mouth daily.    . mupirocin ointment (BACTROBAN) 2 % Reported on 09/25/2015    . omeprazole (PRILOSEC) 40 MG capsule Take 40 mg by mouth daily.    . prochlorperazine  (COMPAZINE) 10 MG tablet Take 10 mg by mouth daily. Reported on 09/25/2015  0  . ranitidine (ZANTAC) 150 MG tablet Take 150 mg by mouth daily as needed for heartburn.   11  . sennosides-docusate sodium (SENOKOT-S) 8.6-50 MG tablet Take 1-2 tablets by mouth 2 (two) times daily as needed for constipation. Reported on 09/25/2015    . tamsulosin (FLOMAX) 0.4 MG CAPS capsule Take 1 capsule (0.4 mg total) by mouth at bedtime. 30 capsule 3  . warfarin (COUMADIN) 2 MG tablet Take 1 tablet (2 mg total) by mouth daily. 90 tablet 4   No current facility-administered medications for this visit.   Facility-Administered Medications Ordered in Other Visits  Medication Dose Route Frequency Provider Last Rate Last Dose  . sodium chloride 0.9 % injection 10 mL  10 mL Intracatheter PRN Curt Bears, MD   10 mL at 08/08/15 1144    SURGICAL HISTORY:  Past Surgical History  Procedure Laterality Date  . Hernia repair    . Posterior cervical fusion/foraminotomy N/A 06/17/2014    Procedure: Thoracic five laminectomy for epidural tumor resection Thoracic 4-7 posterior lateral arthrodesis, segmental pedicle screw fixation.;  Surgeon: Ashok Pall, MD;  Location: [facility];  Service: Neurosurgery;  Laterality: N/A;    REVIEW OF SYSTEMS:  Constitutional: positive for fatigue Eyes: negative Ears, nose, mouth, throat, and face: negative Respiratory: negative Cardiovascular: negative Gastrointestinal: positive for diarrhea Genitourinary:negative Integument/breast: negative Hematologic/lymphatic: negative Musculoskeletal:negative Neurological: negative Behavioral/Psych: negative Endocrine: negative Allergic/Immunologic: negative   PHYSICAL EXAMINATION: General appearance: alert, cooperative, fatigued and no distress Head: Normocephalic, without obvious abnormality, atraumatic Neck: no adenopathy, no JVD, supple, symmetrical, trachea midline and thyroid not enlarged, symmetric, no  tenderness/mass/nodules Lymph nodes: Cervical, supraclavicular, and axillary nodes normal. Resp: clear to auscultation bilaterally Back: symmetric, no curvature. ROM normal. No CVA tenderness. Cardio: regular rate and rhythm, S1, S2 normal, no murmur, click, rub or gallop GI: soft, non-tender; bowel sounds normal; no masses,  no organomegaly Extremities: edema 2+ edema bilaterally with erythema on the legs Neurologic: Alert and oriented X 3, normal strength and tone. Normal symmetric reflexes. Normal coordination and gait Motor: grossly normal Weakness in the left foot  ECOG PERFORMANCE STATUS: 1 - Symptomatic but completely ambulatory  Blood pressure 126/81, pulse 67, temperature 97.6 F (36.4 C), temperature source Oral, resp. rate 17, height '5\' 9"'  (1.753 m), weight 157 lb 6.4 oz (71.396 kg), SpO2 95 %.  LABORATORY DATA: Lab Results  Component Value Date   WBC 2.6* 10/24/2015   HGB 12.2* 10/24/2015   HCT 37.2* 10/24/2015   MCV 97.7 10/24/2015   PLT 86* 10/24/2015      Chemistry      Component Value Date/Time   NA 140 10/24/2015 1138   NA 134* 02/21/2015 0626   K 3.8 10/24/2015 1138   K 4.1 02/21/2015 0626   CL 101 02/21/2015 0626   CO2  32* 10/24/2015 1138   CO2 26 02/21/2015 0626   BUN 15.0 10/24/2015 1138   BUN 12 02/21/2015 0626   CREATININE 1.0 10/24/2015 1138   CREATININE 0.84 02/21/2015 0626      Component Value Date/Time   CALCIUM 9.1 10/24/2015 1138   CALCIUM 7.9* 02/21/2015 0626   ALKPHOS 54 10/24/2015 1138   ALKPHOS 48 02/21/2015 0626   AST 15 10/24/2015 1138   AST 17 02/21/2015 0626   ALT 17 10/24/2015 1138   ALT 25 02/21/2015 0626   BILITOT 0.65 10/24/2015 1138   BILITOT 1.1 02/21/2015 0626       RADIOGRAPHIC STUDIES: No results found.  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 He completed treatment with subcutaneous Velcade and Decadron status post 11 weekly doses. This was discontinued secondary to disease progression. The patient was started on treatment with weekly subcutaneous Velcade, Revlimid for 21 days every 4 weeks in addition to Decadron 40 mg weekly status post 13 cycle and he is currently on treatment with cycle #14. He is tolerating it fairly well except for fatigue and diarrhea.  The recent myeloma panel showed further improvement in his disease. I discussed the lab result with the patient and his wife. I recommended for him to take a break off chemotherapy for the next few months and the patient has very good response to this treatment and he continues to have persistent diarrhea and fatigue. He will complete the last 2 weeks of Revlimid that available to him. I will see him back for follow-up visit in 3 months for reevaluation with repeat myeloma panel. He will proceed with the Zometa infusion as scheduled. He was advised to call immediately if he has any concerning symptoms in the interval. The patient voices understanding of current disease status and treatment options and is in agreement with the current care plan.  All questions were answered. The patient knows to call the clinic with any problems, questions or concerns. We can certainly see the patient much sooner if necessary.  Disclaimer: This note was dictated with voice recognition software. Similar sounding words can inadvertently be transcribed and may not be corrected upon review.

## 2015-10-24 NOTE — Telephone Encounter (Signed)
GAVE ADN PRINTED PAPT SCHED AND AVS FOR PT FOR jUNE

## 2015-10-24 NOTE — Telephone Encounter (Signed)
GAVE AND PRINTD APPT SCHED AND AVS FOR PT FOR PT FOR [place]

## 2015-10-24 NOTE — Telephone Encounter (Signed)
I called [facility] to cancel revlimid refills. Called [facility] too.

## 2015-10-24 NOTE — Telephone Encounter (Signed)
Wife left message that in their papers today was a prescription for another patient.  They are wondering what to do with it.  Called her back 4:40pm and left message that Diane - ?RN for Dr. Julien Nordmann will call her back tomorrow with instructions for this.

## 2015-10-27 DIAGNOSIS — S51019A Laceration without foreign body of unspecified elbow, initial encounter: Secondary | ICD-10-CM | POA: Diagnosis not present

## 2015-10-29 ENCOUNTER — Telehealth: Payer: Self-pay | Admitting: Medical Oncology

## 2015-10-29 NOTE — Telephone Encounter (Signed)
Calling about revlimid - i told them it is on hold right now.

## 2015-10-30 ENCOUNTER — Encounter: Payer: Self-pay | Admitting: Neurology

## 2015-10-30 ENCOUNTER — Ambulatory Visit (INDEPENDENT_AMBULATORY_CARE_PROVIDER_SITE_OTHER): Payer: Medicare Other | Admitting: Neurology

## 2015-10-30 VITALS — BP 106/60 | HR 70 | Ht 69.0 in | Wt 157.2 lb

## 2015-10-30 DIAGNOSIS — I6529 Occlusion and stenosis of unspecified carotid artery: Secondary | ICD-10-CM | POA: Diagnosis not present

## 2015-10-30 DIAGNOSIS — I639 Cerebral infarction, unspecified: Secondary | ICD-10-CM

## 2015-10-30 DIAGNOSIS — W19XXXD Unspecified fall, subsequent encounter: Secondary | ICD-10-CM

## 2015-10-30 NOTE — Progress Notes (Signed)
[facility] [address] [place]. [place] [address] [phone]       OFFICE FOLLOW-UP NOTE  Jeremiah Owens Date of Birth:  10-12-1929 Medical Record Number:  [id number]   HPI: Jeremiah Owens is seen today for first office follow-up visit following hospital admission for stroke in July 2016. Jeremiah Owens is an 82 y.o. male with a history of multiple myeloma treated with Revlamid who presented with 2 days of right leg weakness and difficulty speaking. He states that this started quite abruptly. Prior to this, he did not have any problems with his speech either. He states that he had difficulty with finding his words, and indeed it was difficult getting history from him because of some word finding difficulty.LKW: 7/8 tpa given?: no, out of window CT scan head on admission showed no acute abnormality and only moderate changes of chronic small vessel disease. MRI scan of the brain showed an acute 89 9 mm left basal ganglia lacunar infarct and old right caudate lacunar infarct. MRA brain showed no large vessel occlusion or stenosis. Transthoracic echo showed ejection fraction of 55-60% without wall motion abnormalities. Carotid ultrasound showed no significant extracranial stenosis or occlusion. HDL cholesterol was 76 mg percent total cholesterol 165. Hb A1c was 5.7. Patient states is still slightly weak and left-sided pedicle his leg. He is currently getting outpatient physical and occupational therapy and has made good recovery. His able to walk with a wheeled walker for clot or balance and uses a cane Dose. Patient is currently having ongoing chemotherapy for his myeloma and tolerating it well. The patient complains of significant skin bruising as he is on both aspirin 81 as well as warfarin.. I am not clear as to why he is on warfarin. He states his speech difficulties from his stroke symptoms have improved significantly though he has developed some trouble walking and dragging  his left leg and this may be a chronic problem not related to the present stroke Update 10/29/2017 : He returns for follow-up after last visit 6 months ago. Accompanied by his wife. He states he has had 2 falls. The first for more than a shower when he was reaching out further to our clinic on the floor. The second fall occurred a week ago when he was hurting and he fell over and hit his elbow and sustained a bruise. He was seen in urgent care. Patient remains on low-dose warfarin 2 mg daily for 6 doses without and monitoring for hypercoagulability from myeloma. He does not have atrial fibrillation. He is also on aspirin. He has had no recurrent stroke or TIA symptoms. He states his blood pressure is usually in the 120s though it is slightly low today at [id number] in office. His multiple myeloma seems quite stable he has been on Revlumid for a year and now is taking a break for 3 months as per his oncologist. He uses a wheeled walker when walking outdoors in his usually careful. He has no other complaints ROS:   14 system review of systems is positive for  constipation, diarrhea, easy bruising, bleeding, fall, memory loss, restless legs, daytime sleepiness, balance difficulty, skin wounds and all other systems negative PMH:  Past Medical History  Diagnosis Date  . Compression fracture   . Allergy   . Anxiety   . Thyroid disease   . S/P radiation therapy 08/21/14-09/04/14    T4-6 25Gy/24f  . S/P radiation therapy 11/29/14-12/12/14    rt prox humerus/shoulder/scapula 25Gy/15f . Encounter for  antineoplastic chemotherapy 01/31/2015  . Melanoma ([place])   . Bone cancer ([place])     t_spine T-5  . Prostate cancer ([place]) 2002  . Skin cancer 1994    melanoma  right neck   . Multiple myeloma ([place])   . Hearing loss   . Stroke [facility])     Social History:  Social History   Social History  . Marital Status: Married    Spouse Name: N/A  . Number of Children: N/A  . Years of Education: N/A   Occupational  History  . Not on file.   Social History Main Topics  . Smoking status: Never Smoker   . Smokeless tobacco: Not on file  . Alcohol Use: No  . Drug Use: No  . Sexual Activity: Not on file   Other Topics Concern  . Not on file   Social History Narrative    Medications:   Current Outpatient Prescriptions on File Prior to Visit  Medication Sig Dispense Refill  . acyclovir (ZOVIRAX) 400 MG tablet Take 1 tablet (400 mg total) by mouth 2 (two) times daily. 60 tablet 2  . aspirin 81 MG EC tablet Take 81 mg by mouth daily.  0  . Calcium Carbonate-Vitamin D (CALTRATE 600+D PO) Take 600 mg by mouth 2 (two) times daily.    . cholecalciferol (VITAMIN D) 1000 UNITS tablet Take 1,000 Units by mouth at bedtime.     Marland Kitchen dexamethasone (DECADRON) 4 MG tablet TAKE 10 TABS EVERY WEEK, START WITH CHEMO 40 tablet 4  . furosemide (LASIX) 20 MG tablet Take 20 mg by mouth 2 (two) times daily.    Marland Kitchen HYDROcodone-acetaminophen (NORCO) 5-325 MG per tablet Take 1 tablet by mouth every 6 (six) hours as needed for moderate pain. 60 tablet 0  . KLOR-CON 10 10 MEQ tablet Take 10 mEq by mouth daily.    Marland Kitchen levothyroxine (SYNTHROID, LEVOTHROID) 88 MCG tablet Take 88 mcg by mouth daily.    Marland Kitchen loperamide (IMODIUM) 1 MG/5ML solution Take 2 mg by mouth as needed for diarrhea or loose stools.    . methocarbamol (ROBAXIN) 500 MG tablet TAKE 1 TABLET (500 MG TOTAL) BY MOUTH EVERY 6 (SIX) HOURS AS NEEDED FOR MUSCLE SPASMS. 180 tablet 4  . Multiple Vitamins-Minerals (CENTRUM SILVER PO) Take 1 tablet by mouth daily.    . mupirocin ointment (BACTROBAN) 2 % Reported on 09/25/2015    . omeprazole (PRILOSEC) 40 MG capsule Take 40 mg by mouth daily.    . prochlorperazine (COMPAZINE) 10 MG tablet Take 10 mg by mouth daily. Reported on 09/25/2015  0  . ranitidine (ZANTAC) 150 MG tablet Take 150 mg by mouth daily as needed for heartburn.   11  . sennosides-docusate sodium (SENOKOT-S) 8.6-50 MG tablet Take 1-2 tablets by mouth 2 (two) times  daily as needed for constipation. Reported on 09/25/2015    . tamsulosin (FLOMAX) 0.4 MG CAPS capsule Take 1 capsule (0.4 mg total) by mouth at bedtime. 30 capsule 3  . warfarin (COUMADIN) 2 MG tablet Take 1 tablet (2 mg total) by mouth daily. 90 tablet 4   Current Facility-Administered Medications on File Prior to Visit  Medication Dose Route Frequency Provider Last Rate Last Dose  . sodium chloride 0.9 % injection 10 mL  10 mL Intracatheter PRN Curt Bears, MD   10 mL at 08/08/15 1144    Allergies:   Allergies  Allergen Reactions  . Iodine Itching    Patient had a reaction 20 years ago and  can't remember what kind of reaction he had.  Patient was given decadron in the ER and had an Angio ordered and did test with no problems.  Bottom line "patient needs to pre-medicated before any IV dye is given".    Physical Exam General: well developed, well nourished, seated, in no evident distress Head: head normocephalic and atraumatic.  Neck: supple with no carotid or supraclavicular bruits Cardiovascular: regular rate and rhythm, no murmurs Musculoskeletal: no deformity Skin:  no rash/but scattered petechiae over her forearms and legs Vascular:  Normal pulses all extremities Filed Vitals:   10/30/15 1103  BP: 106/60  Pulse: 70   Neurologic Exam Mental Status: Awake and fully alert. Oriented to place and time. Recent and remote memory intact. Attention span, concentration and fund of knowledge appropriate. Mood and affect appropriate.  Cranial Nerves: Fundoscopic exam not done   Pupils equal, briskly reactive to light. Extraocular movements full without nystagmus. Visual fields full to confrontation. Hearing mildly diminished bilaterally despite hearing aids.. Facial sensation intact. Face, tongue, palate moves normally and symmetrically.  Motor: Normal bulk and tone. Normal strength in all tested extremity muscles. Except mild weakness of left grip, hip flexors and ankle dorsiflexors.  Diminished fine finger movements on the left. Orbits right over left upper extremity. Sensory.: intact to touch ,pinprick .position and vibratory sensation.  Coordination: Rapid alternating movements normal in all extremities. Finger-to-nose  performed accurately bilaterally but impaired. heel-to-shin on the left Gait and Station: Arises from chair with slight difficulty. Stance is stooped. Gait demonstrates dragging of left leg and spasticity .  Uses for with a walker. Balance poor. Reflexes: 1+ and symmetric. Toes downgoing.       ASSESSMENT: 82 year male with leftt basal ganglia infarct in July 2016 due to small vessel disease with vascular risk factors of  , hypertension and hypercoagulability from myeloma with chemotherapy    PLAN:  had a long discussion with the patient and his wife regarding his remote stroke and he appears to be stable from neurovascular standpoint. Continue aspirin for stroke prevention and strict control of hypertension with blood pressure goal below 130/90. He remains on low-dose warfarin without INR monitoring for hypercoagulability related to multiple myeloma. Advised the patient to maintain good hydration. Also had a long discussion with him about his recent falls and talked about fall risk prevention strategies. He was counseled to use a walker at all times. He will check follow-up carotid ultrasound study and return for follow-up in the future in one year or call earlier if necessary. Greater than 50% time during this 25 minute visit was spent on counseling and coordination of care. Antony Contras, MD  Note: This document was prepared with digital dictation and possible smart phrase technology. Any transcriptional errors that result from this process are unintentional

## 2015-10-30 NOTE — Patient Instructions (Addendum)
I had a long discussion with the patient and his wife regarding his remote stroke and he appears to be stable from neurovascular standpoint. Continue aspirin for stroke prevention and strict control of hypertension with blood pressure goal below 130/90. He remains on low-dose warfarin without INR monitoring for hypercoagulability related to multiple myeloma. Advised the patient to maintain good hydration. Also had a long discussion with him about his recent falls and talked about fall risk prevention strategies. He was counseled to use a walker at all times. He will check follow-up carotid ultrasound study and return for follow-up in the future in one year or call earlier if necessary. Fall Prevention in the Home  Falls can cause injuries and can affect people from all age groups. There are many simple things that you can do to make your home safe and to help prevent falls. WHAT CAN I DO ON THE OUTSIDE OF MY HOME?  Regularly repair the edges of walkways and driveways and fix any cracks.  Remove high doorway thresholds.  Trim any shrubbery on the main path into your home.  Use bright outdoor lighting.  Clear walkways of debris and clutter, including tools and rocks.  Regularly check that handrails are securely fastened and in good repair. Both sides of any steps should have handrails.  Install guardrails along the edges of any raised decks or porches.  Have leaves, snow, and ice cleared regularly.  Use sand or salt on walkways during winter months.  In the garage, clean up any spills right away, including grease or oil spills. WHAT CAN I DO IN THE BATHROOM?  Use night lights.  Install grab bars by the toilet and in the tub and shower. Do not use towel bars as grab bars.  Use non-skid mats or decals on the floor of the tub or shower.  If you need to sit down while you are in the shower, use a plastic, non-slip stool.Marland Kitchen  Keep the floor dry. Immediately clean up any water that spills on  the floor.  Remove soap buildup in the tub or shower on a regular basis.  Attach bath mats securely with double-sided non-slip rug tape.  Remove throw rugs and other tripping hazards from the floor. WHAT CAN I DO IN THE BEDROOM?  Use night lights.  Make sure that a bedside light is easy to reach.  Do not use oversized bedding that drapes onto the floor.  Have a firm chair that has side arms to use for getting dressed.  Remove throw rugs and other tripping hazards from the floor. WHAT CAN I DO IN THE KITCHEN?   Clean up any spills right away.  Avoid walking on wet floors.  Place frequently used items in easy-to-reach places.  If you need to reach for something above you, use a sturdy step stool that has a grab bar.  Keep electrical cables out of the way.  Do not use floor polish or wax that makes floors slippery. If you have to use wax, make sure that it is non-skid floor wax.  Remove throw rugs and other tripping hazards from the floor. WHAT CAN I DO IN THE STAIRWAYS?  Do not leave any items on the stairs.  Make sure that there are handrails on both sides of the stairs. Fix handrails that are broken or loose. Make sure that handrails are as long as the stairways.  Check any carpeting to make sure that it is firmly attached to the stairs. Fix any carpet that is  loose or worn.  Avoid having throw rugs at the top or bottom of stairways, or secure the rugs with carpet tape to prevent them from moving.  Make sure that you have a light switch at the top of the stairs and the bottom of the stairs. If you do not have them, have them installed. WHAT ARE SOME OTHER FALL PREVENTION TIPS?  Wear closed-toe shoes that fit well and support your feet. Wear shoes that have rubber soles or low heels.  When you use a stepladder, make sure that it is completely opened and that the sides are firmly locked. Have someone hold the ladder while you are using it. Do not climb a closed  stepladder.  Add color or contrast paint or tape to grab bars and handrails in your home. Place contrasting color strips on the first and last steps.  Use mobility aids as needed, such as canes, walkers, scooters, and crutches.  Turn on lights if it is dark. Replace any light bulbs that burn out.  Set up furniture so that there are clear paths. Keep the furniture in the same spot.  Fix any uneven floor surfaces.  Choose a carpet design that does not hide the edge of steps of a stairway.  Be aware of any and all pets.  Review your medicines with your healthcare provider. Some medicines can cause dizziness or changes in blood pressure, which increase your risk of falling. Talk with your health care provider about other ways that you can decrease your risk of falls. This may include working with a physical therapist or trainer to improve your strength, balance, and endurance.   This information is not intended to replace advice given to you by your health care provider. Make sure you discuss any questions you have with your health care provider.   Document Released: 07/11/2002 Document Revised: 12/05/2014 Document Reviewed: 08/25/2014 Elsevier Interactive Patient Education [employer].

## 2015-10-31 ENCOUNTER — Ambulatory Visit: Payer: Medicare Other

## 2015-10-31 ENCOUNTER — Ambulatory Visit (HOSPITAL_BASED_OUTPATIENT_CLINIC_OR_DEPARTMENT_OTHER): Payer: Medicare Other

## 2015-10-31 ENCOUNTER — Other Ambulatory Visit (HOSPITAL_BASED_OUTPATIENT_CLINIC_OR_DEPARTMENT_OTHER): Payer: Medicare Other

## 2015-10-31 ENCOUNTER — Other Ambulatory Visit: Payer: Medicare Other

## 2015-10-31 VITALS — BP 129/59 | HR 70 | Temp 97.8°F | Resp 18

## 2015-10-31 DIAGNOSIS — C9000 Multiple myeloma not having achieved remission: Secondary | ICD-10-CM | POA: Diagnosis present

## 2015-10-31 LAB — CBC WITH DIFFERENTIAL/PLATELET
BASO%: 0.7 % (ref 0.0–2.0)
BASOS ABS: 0 10*3/uL (ref 0.0–0.1)
EOS%: 5.7 % (ref 0.0–7.0)
Eosinophils Absolute: 0.2 10*3/uL (ref 0.0–0.5)
HCT: 38.4 % (ref 38.4–49.9)
HEMOGLOBIN: 13.3 g/dL (ref 13.0–17.1)
LYMPH#: 0.4 10*3/uL — AB (ref 0.9–3.3)
LYMPH%: 14.5 % (ref 14.0–49.0)
MCH: 32.8 pg (ref 27.2–33.4)
MCHC: 34.6 g/dL (ref 32.0–36.0)
MCV: 94.8 fL (ref 79.3–98.0)
MONO#: 0.5 10*3/uL (ref 0.1–0.9)
MONO%: 17 % — AB (ref 0.0–14.0)
NEUT#: 1.8 10*3/uL (ref 1.5–6.5)
NEUT%: 62.1 % (ref 39.0–75.0)
NRBC: 0 % (ref 0–0)
Platelets: 92 10*3/uL — ABNORMAL LOW (ref 140–400)
RBC: 4.05 10*6/uL — AB (ref 4.20–5.82)
RDW: 15.5 % — AB (ref 11.0–14.6)
WBC: 2.8 10*3/uL — ABNORMAL LOW (ref 4.0–10.3)

## 2015-10-31 LAB — COMPREHENSIVE METABOLIC PANEL
ALBUMIN: 3.7 g/dL (ref 3.5–5.0)
ALT: 17 U/L (ref 0–55)
AST: 17 U/L (ref 5–34)
Alkaline Phosphatase: 50 U/L (ref 40–150)
Anion Gap: 8 mEq/L (ref 3–11)
BUN: 17.3 mg/dL (ref 7.0–26.0)
CO2: 30 meq/L — AB (ref 22–29)
Calcium: 9.2 mg/dL (ref 8.4–10.4)
Chloride: 103 mEq/L (ref 98–109)
Creatinine: 1 mg/dL (ref 0.7–1.3)
EGFR: 73 mL/min/{1.73_m2} — ABNORMAL LOW (ref >90)
GLUCOSE: 91 mg/dL (ref 70–140)
POTASSIUM: 3.8 meq/L (ref 3.5–5.1)
SODIUM: 141 meq/L (ref 136–145)
TOTAL PROTEIN: 6 g/dL — AB (ref 6.4–8.3)
Total Bilirubin: 1.06 mg/dL (ref 0.20–1.20)

## 2015-10-31 MED ORDER — ZOLEDRONIC ACID 4 MG/100ML IV SOLN
4.0000 mg | Freq: Once | INTRAVENOUS | Status: AC
  Administered 2015-10-31: 4 mg via INTRAVENOUS
  Filled 2015-10-31: qty 100

## 2015-10-31 MED ORDER — SODIUM CHLORIDE 0.9 % IV SOLN
Freq: Once | INTRAVENOUS | Status: AC
  Administered 2015-10-31: 11:00:00 via INTRAVENOUS

## 2015-10-31 NOTE — Patient Instructions (Signed)

## 2015-11-01 DIAGNOSIS — C9000 Multiple myeloma not having achieved remission: Secondary | ICD-10-CM | POA: Diagnosis not present

## 2015-11-07 ENCOUNTER — Other Ambulatory Visit: Payer: Medicare Other

## 2015-11-07 ENCOUNTER — Ambulatory Visit (INDEPENDENT_AMBULATORY_CARE_PROVIDER_SITE_OTHER): Payer: Medicare Other

## 2015-11-07 DIAGNOSIS — I6529 Occlusion and stenosis of unspecified carotid artery: Secondary | ICD-10-CM

## 2015-11-12 DIAGNOSIS — K219 Gastro-esophageal reflux disease without esophagitis: Secondary | ICD-10-CM | POA: Diagnosis not present

## 2015-11-12 DIAGNOSIS — E039 Hypothyroidism, unspecified: Secondary | ICD-10-CM | POA: Diagnosis not present

## 2015-11-12 DIAGNOSIS — R609 Edema, unspecified: Secondary | ICD-10-CM | POA: Diagnosis not present

## 2015-11-12 DIAGNOSIS — S51019A Laceration without foreign body of unspecified elbow, initial encounter: Secondary | ICD-10-CM | POA: Diagnosis not present

## 2015-11-12 DIAGNOSIS — C9000 Multiple myeloma not having achieved remission: Secondary | ICD-10-CM | POA: Diagnosis not present

## 2015-11-12 DIAGNOSIS — Z1389 Encounter for screening for other disorder: Secondary | ICD-10-CM | POA: Diagnosis not present

## 2015-11-12 DIAGNOSIS — C61 Malignant neoplasm of prostate: Secondary | ICD-10-CM | POA: Diagnosis not present

## 2015-11-28 ENCOUNTER — Ambulatory Visit (HOSPITAL_BASED_OUTPATIENT_CLINIC_OR_DEPARTMENT_OTHER): Payer: Medicare Other

## 2015-11-28 ENCOUNTER — Other Ambulatory Visit (HOSPITAL_BASED_OUTPATIENT_CLINIC_OR_DEPARTMENT_OTHER): Payer: Medicare Other

## 2015-11-28 VITALS — BP 129/52 | HR 64 | Temp 98.2°F | Resp 17

## 2015-11-28 DIAGNOSIS — C9000 Multiple myeloma not having achieved remission: Secondary | ICD-10-CM

## 2015-11-28 LAB — CBC WITH DIFFERENTIAL/PLATELET
BASO%: 0.3 % (ref 0.0–2.0)
BASOS ABS: 0 10*3/uL (ref 0.0–0.1)
EOS%: 2.8 % (ref 0.0–7.0)
Eosinophils Absolute: 0.1 10*3/uL (ref 0.0–0.5)
HEMATOCRIT: 35.2 % — AB (ref 38.4–49.9)
HGB: 11.9 g/dL — ABNORMAL LOW (ref 13.0–17.1)
LYMPH#: 0.5 10*3/uL — AB (ref 0.9–3.3)
LYMPH%: 17.7 % (ref 14.0–49.0)
MCH: 32.5 pg (ref 27.2–33.4)
MCHC: 33.8 g/dL (ref 32.0–36.0)
MCV: 96.2 fL (ref 79.3–98.0)
MONO#: 0.4 10*3/uL (ref 0.1–0.9)
MONO%: 12.5 % (ref 0.0–14.0)
NEUT#: 1.9 10*3/uL (ref 1.5–6.5)
NEUT%: 66.7 % (ref 39.0–75.0)
PLATELETS: 110 10*3/uL — AB (ref 140–400)
RBC: 3.66 10*6/uL — AB (ref 4.20–5.82)
RDW: 15.8 % — ABNORMAL HIGH (ref 11.0–14.6)
WBC: 2.9 10*3/uL — ABNORMAL LOW (ref 4.0–10.3)

## 2015-11-28 LAB — COMPREHENSIVE METABOLIC PANEL
ALT: 14 U/L (ref 0–55)
ANION GAP: 7 meq/L (ref 3–11)
AST: 17 U/L (ref 5–34)
Albumin: 3.5 g/dL (ref 3.5–5.0)
Alkaline Phosphatase: 53 U/L (ref 40–150)
BUN: 16.4 mg/dL (ref 7.0–26.0)
CALCIUM: 9.2 mg/dL (ref 8.4–10.4)
CHLORIDE: 106 meq/L (ref 98–109)
CO2: 27 meq/L (ref 22–29)
CREATININE: 0.9 mg/dL (ref 0.7–1.3)
EGFR: 79 mL/min/{1.73_m2} — AB (ref >90)
Glucose: 77 mg/dl (ref 70–140)
POTASSIUM: 4.1 meq/L (ref 3.5–5.1)
Sodium: 140 mEq/L (ref 136–145)
Total Bilirubin: 0.68 mg/dL (ref 0.20–1.20)
Total Protein: 5.6 g/dL — ABNORMAL LOW (ref 6.4–8.3)

## 2015-11-28 MED ORDER — ZOLEDRONIC ACID 4 MG/100ML IV SOLN
4.0000 mg | Freq: Once | INTRAVENOUS | Status: AC
  Administered 2015-11-28: 4 mg via INTRAVENOUS
  Filled 2015-11-28: qty 100

## 2015-11-28 MED ORDER — SODIUM CHLORIDE 0.9 % IV SOLN
Freq: Once | INTRAVENOUS | Status: AC
  Administered 2015-11-28: 11:00:00 via INTRAVENOUS

## 2015-11-28 NOTE — Patient Instructions (Signed)

## 2015-12-26 ENCOUNTER — Ambulatory Visit (HOSPITAL_BASED_OUTPATIENT_CLINIC_OR_DEPARTMENT_OTHER): Payer: Medicare Other | Admitting: Nurse Practitioner

## 2015-12-26 ENCOUNTER — Other Ambulatory Visit (HOSPITAL_BASED_OUTPATIENT_CLINIC_OR_DEPARTMENT_OTHER): Payer: Medicare Other

## 2015-12-26 ENCOUNTER — Ambulatory Visit (HOSPITAL_BASED_OUTPATIENT_CLINIC_OR_DEPARTMENT_OTHER): Payer: Medicare Other

## 2015-12-26 VITALS — BP 128/60 | Temp 98.1°F

## 2015-12-26 DIAGNOSIS — C9000 Multiple myeloma not having achieved remission: Secondary | ICD-10-CM

## 2015-12-26 DIAGNOSIS — R197 Diarrhea, unspecified: Secondary | ICD-10-CM | POA: Diagnosis not present

## 2015-12-26 DIAGNOSIS — R609 Edema, unspecified: Secondary | ICD-10-CM | POA: Diagnosis not present

## 2015-12-26 LAB — COMPREHENSIVE METABOLIC PANEL
ALBUMIN: 3.7 g/dL (ref 3.5–5.0)
ALK PHOS: 58 U/L (ref 40–150)
ALT: 12 U/L (ref 0–55)
AST: 16 U/L (ref 5–34)
Anion Gap: 8 mEq/L (ref 3–11)
BUN: 18.1 mg/dL (ref 7.0–26.0)
CALCIUM: 9.4 mg/dL (ref 8.4–10.4)
CO2: 26 mEq/L (ref 22–29)
Chloride: 108 mEq/L (ref 98–109)
Creatinine: 0.9 mg/dL (ref 0.7–1.3)
EGFR: 79 mL/min/{1.73_m2} — AB (ref >90)
Glucose: 103 mg/dl (ref 70–140)
POTASSIUM: 4 meq/L (ref 3.5–5.1)
SODIUM: 142 meq/L (ref 136–145)
Total Bilirubin: 0.56 mg/dL (ref 0.20–1.20)
Total Protein: 5.9 g/dL — ABNORMAL LOW (ref 6.4–8.3)

## 2015-12-26 LAB — CBC WITH DIFFERENTIAL/PLATELET
BASO%: 0.3 % (ref 0.0–2.0)
BASOS ABS: 0 10*3/uL (ref 0.0–0.1)
EOS ABS: 0 10*3/uL (ref 0.0–0.5)
EOS%: 1 % (ref 0.0–7.0)
HEMATOCRIT: 36.8 % — AB (ref 38.4–49.9)
HEMOGLOBIN: 12.4 g/dL — AB (ref 13.0–17.1)
LYMPH%: 13.7 % — ABNORMAL LOW (ref 14.0–49.0)
MCH: 33 pg (ref 27.2–33.4)
MCHC: 33.6 g/dL (ref 32.0–36.0)
MCV: 98.2 fL — AB (ref 79.3–98.0)
MONO#: 0.4 10*3/uL (ref 0.1–0.9)
MONO%: 10.1 % (ref 0.0–14.0)
NEUT#: 2.6 10*3/uL (ref 1.5–6.5)
NEUT%: 74.9 % (ref 39.0–75.0)
Platelets: 128 10*3/uL — ABNORMAL LOW (ref 140–400)
RBC: 3.75 10*6/uL — ABNORMAL LOW (ref 4.20–5.82)
RDW: 16.4 % — AB (ref 11.0–14.6)
WBC: 3.5 10*3/uL — ABNORMAL LOW (ref 4.0–10.3)
lymph#: 0.5 10*3/uL — ABNORMAL LOW (ref 0.9–3.3)

## 2015-12-26 MED ORDER — ZOLEDRONIC ACID 4 MG/100ML IV SOLN
4.0000 mg | Freq: Once | INTRAVENOUS | Status: AC
Start: 2015-12-26 — End: 2015-12-26
  Administered 2015-12-26: 4 mg via INTRAVENOUS
  Filled 2015-12-26: qty 100

## 2015-12-26 MED ORDER — HEPARIN SOD (PORK) LOCK FLUSH 100 UNIT/ML IV SOLN
500.0000 [IU] | Freq: Once | INTRAVENOUS | Status: DC | PRN
  Filled 2015-12-26: qty 5

## 2015-12-26 MED ORDER — HEPARIN SOD (PORK) LOCK FLUSH 100 UNIT/ML IV SOLN
250.0000 [IU] | Freq: Once | INTRAVENOUS | Status: DC | PRN
  Filled 2015-12-26: qty 5

## 2015-12-26 MED ORDER — SODIUM CHLORIDE 0.9 % IV SOLN
Freq: Once | INTRAVENOUS | Status: AC
  Administered 2015-12-26: 11:00:00 via INTRAVENOUS

## 2015-12-26 MED ORDER — SODIUM CHLORIDE 0.9 % IJ SOLN
10.0000 mL | INTRAMUSCULAR | Status: DC | PRN
  Filled 2015-12-26: qty 10

## 2015-12-26 MED ORDER — SODIUM CHLORIDE 0.9 % IJ SOLN
3.0000 mL | Freq: Once | INTRAMUSCULAR | Status: DC | PRN
  Filled 2015-12-26: qty 10

## 2015-12-26 MED ORDER — DIPHENOXYLATE-ATROPINE 2.5-0.025 MG PO TABS: 2.0000 | ORAL_TABLET | Freq: Four times a day (QID) | ORAL | Status: DC | PRN

## 2015-12-26 MED ORDER — ALTEPLASE 2 MG IJ SOLR
2.0000 mg | Freq: Once | INTRAMUSCULAR | Status: DC | PRN
  Filled 2015-12-26: qty 2

## 2015-12-26 NOTE — Progress Notes (Signed)
Upon arrival pt states he is not feeling as well as usual. States he is more fatigued than usual, short of breath with minimal activity and is experiencing significant bilateral leg swelling. Examined both legs-both with tight 3-4+ edema to knees with diffuse erythema to both legs. No open sores, no weeping of fluid. He states it makes it difficult for him to walk as his legs feel so heavy and he gets SOB.  Notified Selena Lesser, NP in [facility] and she came to examine and then discuss with Dr. Julien Nordmann. Arrangements made for pt to see NP @ his PCP office this afternoon @ 4pm. Copy of labs sent with pt. Pt and his wife voiced understanding.

## 2015-12-26 NOTE — Patient Instructions (Signed)

## 2015-12-27 ENCOUNTER — Encounter: Payer: Self-pay | Admitting: Nurse Practitioner

## 2015-12-27 NOTE — Assessment & Plan Note (Signed)
Patient is status post Velcade and Revlimid.  He is taking a break from chemotherapy; but continues to receive his Zometa infusions on an every three-month basis.  His last Zometa infusion was 12/26/2015.  Labs obtained today were all essentially within normal limits.  Patient is scheduled to return for labs only on 01/16/2016.  He is scheduled for labs, visit, and an infusion on 01/23/2016.

## 2015-12-27 NOTE — Assessment & Plan Note (Signed)
Patient states that he has been suffering with some intermittent episodes of diarrhea.  He has been trying Imodium with only minimal effectiveness.  Patient was given a prescription for Lomotil; and advised to alternate the Lomotil with the Imodium.

## 2015-12-27 NOTE — Assessment & Plan Note (Addendum)
Patient has a history of chronic peripheral edema; and takes Lasix as directed per his primary care physician Dr. Deforest Hoyles.  Patient states that he is occasionally instructed to increase his Lasix if his edema to his bilateral lower extremities increases.  Patient reports that he is experiencing increased edema to his bilateral lower extremities.  The colon as well as some increase shortness of breath with exertion.  He continues to deny any chest pain or chest pressure.  He also denies any pain with inspiration.  Exam today does reveal some +2 edema to bilateral lower extremities; but no calf tenderness.  This provider called Dr. Sherilyn Cooter office and spoke to [place], [place] for Dr. Lysle Rubens.  She advised that patient would need to be seen in Dr. Sherilyn Cooter office today.  Follow up appointment with Dr. Deforest Hoyles was made for 4:00 this afternoon for further evaluation and management of peripheral edema and perhaps adjustment of medications.  Reviewed all these findings with Dr. Julien Nordmann; and he was in agreement with this plan of care.

## 2015-12-27 NOTE — Progress Notes (Signed)
SYMPTOM MANAGEMENT CLINIC    Chief Complaint: Peripheral edema  HPI:  Jeremiah Owens 82 y.o. male diagnosed with multiple myeloma.  Patient is status post Velcade and Revlimid therapies.  Continues to receive Zometa infusions.   Patient has a history of chronic peripheral edema; and takes Lasix as directed per his primary care physician Dr. Deforest Hoyles.  Patient states that he is occasionally instructed to increase his Lasix if his edema to his bilateral lower extremities increases.  Patient reports that he is experiencing increased edema to his bilateral lower extremities.  The colon as well as some increase shortness of breath with exertion.  He continues to deny any chest pain or chest pressure.  He also denies any pain with inspiration.  Exam today does reveal some +2 edema to bilateral lower extremities; but no calf tenderness.  This provider called Dr. Sherilyn Cooter office and spoke to [place], [place] for Dr. Lysle Rubens.  She advised that patient would need to be seen in Dr. Sherilyn Cooter office today.  Follow up appointment with Dr. Deforest Hoyles was made for 4:00 this afternoon for further evaluation and management of peripheral edema and perhaps adjustment of medications.  Reviewed all these findings with Dr. Julien Nordmann; and he was in agreement with this plan of care.   No history exists.    Review of Systems  Constitutional: Positive for malaise/fatigue.  Respiratory: Positive for shortness of breath.   Cardiovascular: Positive for leg swelling.  Gastrointestinal: Positive for diarrhea.  All other systems reviewed and are negative.   Past Medical History  Diagnosis Date  . Compression fracture   . Allergy   . Anxiety   . Thyroid disease   . S/P radiation therapy 08/21/14-09/04/14    T4-6 25Gy/1f  . S/P radiation therapy 11/29/14-12/12/14    rt prox humerus/shoulder/scapula 25Gy/11f . Encounter for antineoplastic chemotherapy 01/31/2015  . Melanoma ([facility]  . Bone cancer ([facility]    t_spine T-5    . Prostate cancer ([facility]  . Skin cancer 1994    melanoma  right neck   . Multiple myeloma ([facility]  . Hearing loss   . [facility]    Past Surgical History  Procedure Laterality Date  . Hernia repair    . Posterior cervical fusion/foraminotomy N/A 06/17/2014    Procedure: Thoracic five laminectomy for epidural tumor resection Thoracic 4-7 posterior lateral arthrodesis, segmental pedicle screw fixation.;  Surgeon: KyAshok PallMD;  [facility];  Service: Neurosurgery;  Laterality: N/A;    has Metastatic bone cancer ([facility] Spine metastasis ([facility] Thoracic spine tumor; Neoplasm of thoracic spine; Paraplegia ([facility] Postoperative anemia due to acute blood loss; Multiple myeloma ([facility] Gait disorder; Peripheral edema; Constipation; Rash; Encounter for antineoplastic chemotherapy; [facility] ischemic stroke ([facility]A (cerebral infarction); Aspiration pneumonia ([facility] Aphasia; Difficulty speaking; Speech difficult to understand; [facility] ([facility] Dizziness; Expressive aphasia; Past pointing; Right leg weakness; Basal ganglia infarction ([facility] Diarrhea; Cancer associated pain; Long term current use of anticoagulant therapy; Dysphagia; and Multiple myeloma not having achieved remission ([facility]on his problem list.    is allergic to iodine.    Medication List       This list is accurate as of: 12/26/15 11:59 PM.  Always use your most recent med list.               acyclovir 400 MG tablet  Commonly known as:  ZOVIRAX  Take 1 tablet (400 mg total) by mouth 2 (two) times daily.  aspirin 81 MG EC tablet  Take 81 mg by mouth daily.     CALTRATE 600+D PO  Take 600 mg by mouth 2 (two) times daily.     CENTRUM SILVER PO  Take 1 tablet by mouth daily.     cholecalciferol 1000 units tablet  Commonly known as:  VITAMIN D  Take 1,000 Units by mouth at bedtime.     dexamethasone 4 MG tablet  Commonly known as:  DECADRON  TAKE 10 TABS EVERY WEEK, START WITH CHEMO      diphenoxylate-atropine 2.5-0.025 MG tablet  Commonly known as:  LOMOTIL  Take 2 tablets by mouth 4 (four) times daily as needed for diarrhea or loose stools.     furosemide 20 MG tablet  Commonly known as:  LASIX  Take 20 mg by mouth 2 (two) times daily.     HYDROcodone-acetaminophen 5-325 MG tablet  Commonly known as:  NORCO  Take 1 tablet by mouth every 6 (six) hours as needed for moderate pain.     KLOR-CON 10 10 MEQ tablet  Generic drug:  potassium chloride  Take 10 mEq by mouth daily.     levothyroxine 88 MCG tablet  Commonly known as:  SYNTHROID, LEVOTHROID  Take 88 mcg by mouth daily.     loperamide 1 MG/5ML solution  Commonly known as:  IMODIUM  Take 2 mg by mouth as needed for diarrhea or loose stools.     methocarbamol 500 MG tablet  Commonly known as:  ROBAXIN  TAKE 1 TABLET (500 MG TOTAL) BY MOUTH EVERY 6 (SIX) HOURS AS NEEDED FOR MUSCLE SPASMS.     mupirocin ointment 2 %  Commonly known as:  BACTROBAN  Reported on 09/25/2015     omeprazole 40 MG capsule  Commonly known as:  PRILOSEC  Take 40 mg by mouth daily.     prochlorperazine 10 MG tablet  Commonly known as:  COMPAZINE  Take 10 mg by mouth daily. Reported on 09/25/2015     ranitidine 150 MG tablet  Commonly known as:  ZANTAC  Take 150 mg by mouth daily as needed for heartburn.     sennosides-docusate sodium 8.6-50 MG tablet  Commonly known as:  SENOKOT-S  Take 1-2 tablets by mouth 2 (two) times daily as needed for constipation. Reported on 09/25/2015     tamsulosin 0.4 MG Caps capsule  Commonly known as:  FLOMAX  Take 1 capsule (0.4 mg total) by mouth at bedtime.     warfarin 2 MG tablet  Commonly known as:  COUMADIN  Take 1 tablet (2 mg total) by mouth daily.         PHYSICAL EXAMINATION  Oncology Vitals 12/26/2015 11/28/2015  Height - -  Weight - -  Weight (lbs) - -  BMI (kg/m2) - -  Temp 98.1 98.2  Pulse - 64  Resp - 17  SpO2 100 98  BSA (m2) - -   BP Readings from Last 2  Encounters:  12/26/15 128/60  11/28/15 129/52    Physical Exam  Constitutional: He is oriented to person, place, and time. He appears unhealthy.  HENT:  Head: Normocephalic and atraumatic.  Mouth/Throat: Oropharynx is clear and moist.  Eyes: Conjunctivae and EOM are normal. Pupils are equal, round, and reactive to light. Right eye exhibits no discharge. Left eye exhibits no discharge. No scleral icterus.  Neck: Normal range of motion. Neck supple. No JVD present. No tracheal deviation present. No thyromegaly present.  Cardiovascular: Normal rate, regular rhythm,  normal heart sounds and intact distal pulses.   Pulmonary/Chest: Effort normal and breath sounds normal. No respiratory distress. He has no wheezes. He has no rales. He exhibits no tenderness.  Abdominal: Soft. Bowel sounds are normal. He exhibits no distension and no mass. There is no tenderness. There is no rebound and no guarding.  Musculoskeletal: Normal range of motion. He exhibits edema. He exhibits no tenderness.  +2 edema to bilateral lower extremities.  No calf pain with palpation.  Lymphadenopathy:    He has no cervical adenopathy.  Neurological: He is alert and oriented to person, place, and time.  Skin: Skin is warm and dry. No rash noted. No erythema. There is pallor.  Psychiatric: Affect normal.  Nursing note and vitals reviewed.   LABORATORY DATA:. Appointment on 12/26/2015  Component Date Value Ref Range Status  . WBC 12/26/2015 3.5* 4.0 - 10.3 10e3/uL Final  . NEUT# 12/26/2015 2.6  1.5 - 6.5 10e3/uL Final  . HGB 12/26/2015 12.4* 13.0 - 17.1 g/dL Final  . HCT 12/26/2015 36.8* 38.4 - 49.9 % Final  . Platelets 12/26/2015 128* 140 - 400 10e3/uL Final  . MCV 12/26/2015 98.2* 79.3 - 98.0 fL Final  . MCH 12/26/2015 33.0  27.2 - 33.4 pg Final  . MCHC 12/26/2015 33.6  32.0 - 36.0 g/dL Final  . RBC 12/26/2015 3.75* 4.20 - 5.82 10e6/uL Final  . RDW 12/26/2015 16.4* 11.0 - 14.6 % Final  . lymph# 12/26/2015 0.5* 0.9  - 3.3 10e3/uL Final  . MONO# 12/26/2015 0.4  0.1 - 0.9 10e3/uL Final  . Eosinophils Absolute 12/26/2015 0.0  0.0 - 0.5 10e3/uL Final  . Basophils Absolute 12/26/2015 0.0  0.0 - 0.1 10e3/uL Final  . NEUT% 12/26/2015 74.9  39.0 - 75.0 % Final  . LYMPH% 12/26/2015 13.7* 14.0 - 49.0 % Final  . MONO% 12/26/2015 10.1  0.0 - 14.0 % Final  . EOS% 12/26/2015 1.0  0.0 - 7.0 % Final  . BASO% 12/26/2015 0.3  0.0 - 2.0 % Final  . Sodium 12/26/2015 142  136 - 145 mEq/L Final  . Potassium 12/26/2015 4.0  3.5 - 5.1 mEq/L Final  . Chloride 12/26/2015 108  98 - 109 mEq/L Final  . CO2 12/26/2015 26  22 - 29 mEq/L Final  . Glucose 12/26/2015 103  70 - 140 mg/dl Final   Glucose reference range is for nonfasting patients. Fasting glucose reference range is 70- 100.  Marland Kitchen BUN 12/26/2015 18.1  7.0 - 26.0 mg/dL Final  . Creatinine 12/26/2015 0.9  0.7 - 1.3 mg/dL Final  . Total Bilirubin 12/26/2015 0.56  0.20 - 1.20 mg/dL Final  . Alkaline Phosphatase 12/26/2015 58  40 - 150 U/L Final  . AST 12/26/2015 16  5 - 34 U/L Final  . ALT 12/26/2015 12  0 - 55 U/L Final  . Total Protein 12/26/2015 5.9* 6.4 - 8.3 g/dL Final  . Albumin 12/26/2015 3.7  3.5 - 5.0 g/dL Final  . Calcium 12/26/2015 9.4  8.4 - 10.4 mg/dL Final  . Anion Gap 12/26/2015 8  3 - 11 mEq/L Final  . EGFR 12/26/2015 79* >90 ml/min/1.73 m2 Final   eGFR is calculated using the CKD-EPI Creatinine Equation (2009)    RADIOGRAPHIC STUDIES: No results found.  ASSESSMENT/PLAN:    Multiple myeloma Patient is status post Velcade and Revlimid.  He is taking a break from chemotherapy; but continues to receive his Zometa infusions on an every three-month basis.  His last Zometa infusion was 12/26/2015.  Labs obtained today were all essentially within normal limits.  Patient is scheduled to return for labs only on 01/16/2016.  He is scheduled for labs, visit, and an infusion on 01/23/2016.  Peripheral edema Patient has a history of chronic peripheral edema;  and takes Lasix as directed per his primary care physician Dr. Deforest Hoyles.  Patient states that he is occasionally instructed to increase his Lasix if his edema to his bilateral lower extremities increases.  Patient reports that he is experiencing increased edema to his bilateral lower extremities.  The colon as well as some increase shortness of breath with exertion.  He continues to deny any chest pain or chest pressure.  He also denies any pain with inspiration.  Exam today does reveal some +2 edema to bilateral lower extremities; but no calf tenderness.  This provider called Dr. Sherilyn Cooter office and spoke to [place], [place] for Dr. Lysle Rubens.  She advised that patient would need to be seen in Dr. Sherilyn Cooter office today.  Follow up appointment with Dr. Deforest Hoyles was made for 4:00 this afternoon for further evaluation and management of peripheral edema and perhaps adjustment of medications.  Reviewed all these findings with Dr. Julien Nordmann; and he was in agreement with this plan of care.  Diarrhea Patient states that he has been suffering with some intermittent episodes of diarrhea.  He has been trying Imodium with only minimal effectiveness.  Patient was given a prescription for Lomotil; and advised to alternate the Lomotil with the Imodium.   Patient stated understanding of all instructions; and was in agreement with this plan of care. The patient knows to call the clinic with any problems, questions or concerns.   Total time spent with patient was 25 minutes;  with greater than 85 percent of that time spent in face to face counseling regarding patient's symptoms,  and coordination of care and follow up.  Disclaimer:This dictation was prepared with Dragon/digital dictation along with [employer]. Any transcriptional errors that result from this process are unintentional.  Drue Second, NP 12/27/2015

## 2016-01-01 DIAGNOSIS — H25813 Combined forms of age-related cataract, bilateral: Secondary | ICD-10-CM | POA: Diagnosis not present

## 2016-01-01 DIAGNOSIS — H04123 Dry eye syndrome of bilateral lacrimal glands: Secondary | ICD-10-CM | POA: Diagnosis not present

## 2016-01-01 DIAGNOSIS — H02834 Dermatochalasis of left upper eyelid: Secondary | ICD-10-CM | POA: Diagnosis not present

## 2016-01-01 DIAGNOSIS — H02831 Dermatochalasis of right upper eyelid: Secondary | ICD-10-CM | POA: Diagnosis not present

## 2016-01-04 ENCOUNTER — Other Ambulatory Visit: Payer: Self-pay | Admitting: Medical Oncology

## 2016-01-04 DIAGNOSIS — C9000 Multiple myeloma not having achieved remission: Secondary | ICD-10-CM

## 2016-01-04 MED ORDER — ACYCLOVIR 400 MG PO TABS: 400.0000 mg | ORAL_TABLET | Freq: Two times a day (BID) | ORAL | Status: DC

## 2016-01-09 DIAGNOSIS — R609 Edema, unspecified: Secondary | ICD-10-CM | POA: Diagnosis not present

## 2016-01-14 DIAGNOSIS — Z85828 Personal history of other malignant neoplasm of skin: Secondary | ICD-10-CM | POA: Diagnosis not present

## 2016-01-14 DIAGNOSIS — Z85820 Personal history of malignant melanoma of skin: Secondary | ICD-10-CM | POA: Diagnosis not present

## 2016-01-14 DIAGNOSIS — L814 Other melanin hyperpigmentation: Secondary | ICD-10-CM | POA: Diagnosis not present

## 2016-01-14 DIAGNOSIS — D1801 Hemangioma of skin and subcutaneous tissue: Secondary | ICD-10-CM | POA: Diagnosis not present

## 2016-01-14 DIAGNOSIS — L821 Other seborrheic keratosis: Secondary | ICD-10-CM | POA: Diagnosis not present

## 2016-01-14 DIAGNOSIS — D692 Other nonthrombocytopenic purpura: Secondary | ICD-10-CM | POA: Diagnosis not present

## 2016-01-14 DIAGNOSIS — L570 Actinic keratosis: Secondary | ICD-10-CM | POA: Diagnosis not present

## 2016-01-16 ENCOUNTER — Other Ambulatory Visit (HOSPITAL_BASED_OUTPATIENT_CLINIC_OR_DEPARTMENT_OTHER): Payer: Medicare Other

## 2016-01-16 DIAGNOSIS — C9000 Multiple myeloma not having achieved remission: Secondary | ICD-10-CM | POA: Diagnosis not present

## 2016-01-16 DIAGNOSIS — Z5111 Encounter for antineoplastic chemotherapy: Secondary | ICD-10-CM

## 2016-01-16 DIAGNOSIS — D492 Neoplasm of unspecified behavior of bone, soft tissue, and skin: Secondary | ICD-10-CM

## 2016-01-16 LAB — CBC WITH DIFFERENTIAL/PLATELET
BASO%: 0 % (ref 0.0–2.0)
Basophils Absolute: 0 10*3/uL (ref 0.0–0.1)
EOS%: 1.1 % (ref 0.0–7.0)
Eosinophils Absolute: 0 10*3/uL (ref 0.0–0.5)
HEMATOCRIT: 36.4 % — AB (ref 38.4–49.9)
HEMOGLOBIN: 12.3 g/dL — AB (ref 13.0–17.1)
LYMPH#: 0.6 10*3/uL — AB (ref 0.9–3.3)
LYMPH%: 17.4 % (ref 14.0–49.0)
MCH: 33.3 pg (ref 27.2–33.4)
MCHC: 33.8 g/dL (ref 32.0–36.0)
MCV: 98.6 fL — ABNORMAL HIGH (ref 79.3–98.0)
MONO#: 0.3 10*3/uL (ref 0.1–0.9)
MONO%: 8.7 % (ref 0.0–14.0)
NEUT%: 72.8 % (ref 39.0–75.0)
NEUTROS ABS: 2.6 10*3/uL (ref 1.5–6.5)
Platelets: 112 10*3/uL — ABNORMAL LOW (ref 140–400)
RBC: 3.69 10*6/uL — ABNORMAL LOW (ref 4.20–5.82)
RDW: 14.7 % — AB (ref 11.0–14.6)
WBC: 3.6 10*3/uL — AB (ref 4.0–10.3)

## 2016-01-16 LAB — COMPREHENSIVE METABOLIC PANEL
ALBUMIN: 3.8 g/dL (ref 3.5–5.0)
ALK PHOS: 55 U/L (ref 40–150)
ALT: 15 U/L (ref 0–55)
AST: 17 U/L (ref 5–34)
Anion Gap: 7 mEq/L (ref 3–11)
BUN: 19.2 mg/dL (ref 7.0–26.0)
CALCIUM: 9.4 mg/dL (ref 8.4–10.4)
CO2: 28 mEq/L (ref 22–29)
CREATININE: 1 mg/dL (ref 0.7–1.3)
Chloride: 106 mEq/L (ref 98–109)
EGFR: 66 mL/min/{1.73_m2} — ABNORMAL LOW (ref >90)
GLUCOSE: 88 mg/dL (ref 70–140)
POTASSIUM: 4.2 meq/L (ref 3.5–5.1)
SODIUM: 141 meq/L (ref 136–145)
Total Bilirubin: 0.52 mg/dL (ref 0.20–1.20)
Total Protein: 6 g/dL — ABNORMAL LOW (ref 6.4–8.3)

## 2016-01-16 LAB — LACTATE DEHYDROGENASE: LDH: 161 U/L (ref 125–245)

## 2016-01-17 LAB — IGG, IGA, IGM
IGA/IMMUNOGLOBULIN A, SERUM: 61 mg/dL (ref 61–437)
IGM (IMMUNOGLOBIN M), SRM: 16 mg/dL (ref 15–143)
IgG, Qn, Serum: 434 mg/dL — ABNORMAL LOW (ref 700–1600)

## 2016-01-17 LAB — BETA 2 MICROGLOBULIN, SERUM: Beta-2: 2.5 mg/L — ABNORMAL HIGH (ref 0.6–2.4)

## 2016-01-17 LAB — KAPPA/LAMBDA LIGHT CHAINS
IG KAPPA FREE LIGHT CHAIN: 11.2 mg/L (ref 3.3–19.4)
IG LAMBDA FREE LIGHT CHAIN: 8.6 mg/L (ref 5.7–26.3)
KAPPA/LAMBDA FLC RATIO: 1.3 (ref 0.26–1.65)

## 2016-01-21 DIAGNOSIS — H2512 Age-related nuclear cataract, left eye: Secondary | ICD-10-CM | POA: Diagnosis not present

## 2016-01-21 DIAGNOSIS — H268 Other specified cataract: Secondary | ICD-10-CM | POA: Diagnosis not present

## 2016-01-22 ENCOUNTER — Other Ambulatory Visit: Payer: Self-pay | Admitting: Medical Oncology

## 2016-01-23 ENCOUNTER — Telehealth: Payer: Self-pay | Admitting: Internal Medicine

## 2016-01-23 ENCOUNTER — Other Ambulatory Visit (HOSPITAL_BASED_OUTPATIENT_CLINIC_OR_DEPARTMENT_OTHER): Payer: Medicare Other

## 2016-01-23 ENCOUNTER — Ambulatory Visit (HOSPITAL_BASED_OUTPATIENT_CLINIC_OR_DEPARTMENT_OTHER): Payer: Medicare Other | Admitting: Internal Medicine

## 2016-01-23 ENCOUNTER — Encounter: Payer: Self-pay | Admitting: Internal Medicine

## 2016-01-23 ENCOUNTER — Ambulatory Visit (HOSPITAL_BASED_OUTPATIENT_CLINIC_OR_DEPARTMENT_OTHER): Payer: Medicare Other

## 2016-01-23 VITALS — BP 126/58 | HR 84 | Temp 98.4°F | Resp 18 | Ht 69.0 in | Wt 157.8 lb

## 2016-01-23 DIAGNOSIS — C9000 Multiple myeloma not having achieved remission: Secondary | ICD-10-CM

## 2016-01-23 DIAGNOSIS — R1310 Dysphagia, unspecified: Secondary | ICD-10-CM | POA: Diagnosis not present

## 2016-01-23 DIAGNOSIS — C7951 Secondary malignant neoplasm of bone: Secondary | ICD-10-CM

## 2016-01-23 LAB — CBC WITH DIFFERENTIAL/PLATELET
BASO%: 0.3 % (ref 0.0–2.0)
Basophils Absolute: 0 10*3/uL (ref 0.0–0.1)
EOS ABS: 0 10*3/uL (ref 0.0–0.5)
EOS%: 1 % (ref 0.0–7.0)
HCT: 37.5 % — ABNORMAL LOW (ref 38.4–49.9)
HGB: 12.4 g/dL — ABNORMAL LOW (ref 13.0–17.1)
LYMPH%: 14.8 % (ref 14.0–49.0)
MCH: 32.9 pg (ref 27.2–33.4)
MCHC: 33.1 g/dL (ref 32.0–36.0)
MCV: 99.4 fL — AB (ref 79.3–98.0)
MONO#: 0.3 10*3/uL (ref 0.1–0.9)
MONO%: 8.4 % (ref 0.0–14.0)
NEUT%: 75.5 % — ABNORMAL HIGH (ref 39.0–75.0)
NEUTROS ABS: 2.8 10*3/uL (ref 1.5–6.5)
PLATELETS: 123 10*3/uL — AB (ref 140–400)
RBC: 3.77 10*6/uL — AB (ref 4.20–5.82)
RDW: 15 % — AB (ref 11.0–14.6)
WBC: 3.7 10*3/uL — AB (ref 4.0–10.3)
lymph#: 0.5 10*3/uL — ABNORMAL LOW (ref 0.9–3.3)

## 2016-01-23 LAB — COMPREHENSIVE METABOLIC PANEL
ALBUMIN: 3.8 g/dL (ref 3.5–5.0)
ALK PHOS: 49 U/L (ref 40–150)
ALT: 14 U/L (ref 0–55)
ANION GAP: 8 meq/L (ref 3–11)
AST: 16 U/L (ref 5–34)
BILIRUBIN TOTAL: 0.41 mg/dL (ref 0.20–1.20)
BUN: 15.6 mg/dL (ref 7.0–26.0)
CO2: 27 mEq/L (ref 22–29)
Calcium: 9.2 mg/dL (ref 8.4–10.4)
Chloride: 106 mEq/L (ref 98–109)
Creatinine: 1 mg/dL (ref 0.7–1.3)
EGFR: 73 mL/min/{1.73_m2} — AB (ref >90)
Glucose: 103 mg/dl (ref 70–140)
POTASSIUM: 3.9 meq/L (ref 3.5–5.1)
Sodium: 141 mEq/L (ref 136–145)
TOTAL PROTEIN: 6 g/dL — AB (ref 6.4–8.3)

## 2016-01-23 MED ORDER — SODIUM CHLORIDE 0.9 % IV SOLN
Freq: Once | INTRAVENOUS | Status: AC
  Administered 2016-01-23: 11:00:00 via INTRAVENOUS

## 2016-01-23 MED ORDER — ZOLEDRONIC ACID 4 MG/100ML IV SOLN
4.0000 mg | Freq: Once | INTRAVENOUS | Status: AC
  Administered 2016-01-23: 4 mg via INTRAVENOUS
  Filled 2016-01-23: qty 100

## 2016-01-23 NOTE — Patient Instructions (Signed)

## 2016-01-23 NOTE — Progress Notes (Signed)
[place] Telephone:([phone]) [phone]   Fax:(336) [address] [place] [address]  DIAGNOSIS: Multiple myeloma diagnosed in November 2015  PRIOR THERAPY:  1) Status post Thoracic five laminectomy for epidural tumor resection, Thoracic 4-7 posterior lateral arthrodesis,  segmental pedicle screw fixation T4-T7 Globus instrumentation under the care of Dr. Christella Noa on 06/18/2014. 2) Systemic chemotherapy with Velcade 1.3 MG/M2 subcutaneously weekly in addition to Decadron 40 mg by mouth on weekly basis. Status post 11 cycles. 3) palliative radiotherapy to the right proximal humerus, shoulder and scapula under the care of Dr. Lisbeth Renshaw completed on 12/12/2014.  CURRENT THERAPY:  1) Systemic chemotherapy with Velcade 1.3 MG/M2 subcutaneously weekly, Decadron 40 mg by mouth weekly in addition to Revlimid 25 MG by mouth daily for 21 days every 4 weeks, status post 14 cycles.  2) Zometa 4 mg IV every 12 weeks. First dose 11/01/2014.  INTERVAL HISTORY: Jeremiah Owens 82 y.o. male returns to the clinic today for follow-up visit accompanied by his wife. The patient is feeling fine today was no specific complaints except for mild fatigue. He continues to have few episodes of diarrhea. He has been off treatment with chemotherapy for the last 3 months. He also continues to complain of dysphagia. He denied having any significant nausea or vomiting, no fever or chills. He denied having any significant weight loss or night sweats. He denied having any significant chest pain, shortness of breath, cough or hemoptysis. He had repeat myeloma panel performed earlier today and he is here for evaluation and discussion of his lab results.  MEDICAL HISTORY: Past Medical History  Diagnosis Date  . Compression fracture   . Allergy   . Anxiety   . Thyroid disease   . S/P radiation therapy 08/21/14-09/04/14    T4-6 25Gy/78f  . S/P  radiation therapy 11/29/14-12/12/14    rt prox humerus/shoulder/scapula 25Gy/132f . Encounter for antineoplastic chemotherapy 01/31/2015  . [facility]  . [place] cancer ([facility]    t_spine T-5  . [facility]state cancer ([facility]  . Skin cancer 1994    melanoma  right neck   . Multiple myeloma ([facility]  . Hearing loss   . [facility]    ALLERGIES:  is allergic to iodine.  MEDICATIONS:  Current Outpatient Prescriptions  Medication Sig Dispense Refill  . acyclovir (ZOVIRAX) 400 MG tablet Take 1 tablet (400 mg total) by mouth 2 (two) times daily. 60 tablet 2  . aspirin 81 MG EC tablet Take 81 mg by mouth daily.  0  . Calcium Carbonate-Vitamin D (CALTRATE 600+D PO) Take 600 mg by mouth 2 (two) times daily.    . cholecalciferol (VITAMIN D) 1000 UNITS tablet Take 1,000 Units by mouth at bedtime.     . diphenoxylate-atropine (LOMOTIL) 2.5-0.025 MG tablet Take 2 tablets by mouth 4 (four) times daily as needed for diarrhea or loose stools. 30 tablet 0  . furosemide (LASIX) 20 MG tablet Take 20 mg by mouth daily. Pt takes 1 tablet daily except on Mon and Thurs takes 2 tablets    . KLOR-CON 10 10 MEQ tablet Take 10 mEq by mouth daily.    . Marland Kitchenevothyroxine (SYNTHROID, LEVOTHROID) 88 MCG tablet Take 88 mcg by mouth daily.    . Marland Kitchenoperamide (IMODIUM) 1 MG/5ML solution Take 2 mg by mouth as needed for diarrhea or loose stools.    . Multiple Vitamins-Minerals (CENTRUM SILVER PO)  Take 1 tablet by mouth daily.    Marland Kitchen omeprazole (PRILOSEC) 40 MG capsule Take 40 mg by mouth daily.    . ranitidine (ZANTAC) 150 MG tablet Take 150 mg by mouth daily as needed for heartburn.   11  . tamsulosin (FLOMAX) 0.4 MG CAPS capsule Take 1 capsule (0.4 mg total) by mouth at bedtime. 30 capsule 3  . warfarin (COUMADIN) 2 MG tablet Take 1 tablet (2 mg total) by mouth daily. 90 tablet 4  . dexamethasone (DECADRON) 4 MG tablet TAKE 10 TABS EVERY WEEK, START WITH CHEMO (Patient not taking: Reported on 01/23/2016) 40 tablet 4  .  HYDROcodone-acetaminophen (NORCO) 5-325 MG per tablet Take 1 tablet by mouth every 6 (six) hours as needed for moderate pain. (Patient not taking: Reported on 01/23/2016) 60 tablet 0  . methocarbamol (ROBAXIN) 500 MG tablet TAKE 1 TABLET (500 MG TOTAL) BY MOUTH EVERY 6 (SIX) HOURS AS NEEDED FOR MUSCLE SPASMS. (Patient not taking: Reported on 01/23/2016) 180 tablet 4  . mupirocin ointment (BACTROBAN) 2 % Reported on 01/23/2016    . prochlorperazine (COMPAZINE) 10 MG tablet Take 10 mg by mouth daily. Reported on 01/23/2016  0  . sennosides-docusate sodium (SENOKOT-S) 8.6-50 MG tablet Take 1-2 tablets by mouth 2 (two) times daily as needed for constipation. Reported on 01/23/2016     No current facility-administered medications for this visit.   Facility-Administered Medications Ordered in Other Visits  Medication Dose Route Frequency Provider Last Rate Last Dose  . sodium chloride 0.9 % injection 10 mL  10 mL Intracatheter PRN Curt Bears, MD   10 mL at 08/08/15 1144    SURGICAL HISTORY:  Past Surgical History  Procedure Laterality Date  . Hernia repair    . Posterior cervical fusion/foraminotomy N/A 06/17/2014    Procedure: Thoracic five laminectomy for epidural tumor resection Thoracic 4-7 posterior lateral arthrodesis, segmental pedicle screw fixation.;  Surgeon: Ashok Pall, MD;  Location: [facility];  Service: Neurosurgery;  Laterality: N/A;    REVIEW OF SYSTEMS:  Constitutional: positive for fatigue Eyes: negative Ears, nose, mouth, throat, and face: negative Respiratory: negative Cardiovascular: negative Gastrointestinal: positive for diarrhea and dysphagia Genitourinary:negative Integument/breast: negative Hematologic/lymphatic: negative Musculoskeletal:negative Neurological: negative Behavioral/Psych: negative Endocrine: negative Allergic/Immunologic: negative   PHYSICAL EXAMINATION: General appearance: alert, cooperative, fatigued and no distress Head: Normocephalic,  without obvious abnormality, atraumatic Neck: no adenopathy, no JVD, supple, symmetrical, trachea midline and thyroid not enlarged, symmetric, no tenderness/mass/nodules Lymph nodes: Cervical, supraclavicular, and axillary nodes normal. Resp: clear to auscultation bilaterally Back: symmetric, no curvature. ROM normal. No CVA tenderness. Cardio: regular rate and rhythm, S1, S2 normal, no murmur, click, rub or gallop GI: soft, non-tender; bowel sounds normal; no masses,  no organomegaly Extremities: edema 2+ edema bilaterally with erythema on the legs Neurologic: Alert and oriented X 3, normal strength and tone. Normal symmetric reflexes. Normal coordination and gait Motor: grossly normal Weakness in the left foot  ECOG PERFORMANCE STATUS: 1 - Symptomatic but completely ambulatory  Blood pressure 126/58, pulse 84, temperature 98.4 F (36.9 C), temperature source Oral, resp. rate 18, height '5\' 9"'  (1.753 m), weight 157 lb 12.8 oz (71.578 kg), SpO2 100 %.  LABORATORY DATA: Lab Results  Component Value Date   WBC 3.7* 01/23/2016   HGB 12.4* 01/23/2016   HCT 37.5* 01/23/2016   MCV 99.4* 01/23/2016   PLT 123* 01/23/2016      Chemistry      Component Value Date/Time   NA 141 01/23/2016 0934  NA 134* 02/21/2015 0626   K 3.9 01/23/2016 0934   K 4.1 02/21/2015 0626   CL 101 02/21/2015 0626   CO2 27 01/23/2016 0934   CO2 26 02/21/2015 0626   BUN 15.6 01/23/2016 0934   BUN 12 02/21/2015 0626   CREATININE 1.0 01/23/2016 0934   CREATININE 0.84 02/21/2015 0626      Component Value Date/Time   CALCIUM 9.2 01/23/2016 0934   CALCIUM 7.9* 02/21/2015 0626   ALKPHOS 49 01/23/2016 0934   ALKPHOS 48 02/21/2015 0626   AST 16 01/23/2016 0934   AST 17 02/21/2015 0626   ALT 14 01/23/2016 0934   ALT 25 02/21/2015 0626   BILITOT 0.41 01/23/2016 0934   BILITOT 1.1 02/21/2015 0626       RADIOGRAPHIC STUDIES: No results found.  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 He completed treatment with subcutaneous Velcade and Decadron status post 11 weekly doses. This was discontinued secondary to disease progression. The patient was started on treatment with weekly subcutaneous Velcade, Revlimid for 21 days every 4 weeks in addition to Decadron 40 mg weekly status post 14 cycle  Has been off treatment for the last 3 months. The patient is feeling fine and the recent myeloma panel showed no evidence for disease progression. I discussed the lab result with the patient and his wife. I recommended for him to continue on observation. I will see him back for follow-up visit in 3 months for reevaluation with repeat myeloma panel. He will proceed with the Zometa infusion every 3 months as scheduled. For the persistent dysphagia, I referred the patient to gastroenterology for evaluation. He was advised to call immediately if he has any concerning symptoms in the interval. The patient voices understanding of current disease status and treatment options and is in agreement with the current care plan.  All questions were answered. The patient knows to call the clinic with any problems, questions or concerns. We can certainly see the patient much sooner if necessary.  Disclaimer: This note was dictated with voice recognition software. Similar sounding words can inadvertently be transcribed and may not be corrected upon review.

## 2016-01-23 NOTE — Telephone Encounter (Signed)
Added appt per pof...the patient aware to get print out in chemo

## 2016-02-18 DIAGNOSIS — H2511 Age-related nuclear cataract, right eye: Secondary | ICD-10-CM | POA: Diagnosis not present

## 2016-02-25 DIAGNOSIS — H2181 Floppy iris syndrome: Secondary | ICD-10-CM | POA: Diagnosis not present

## 2016-02-25 DIAGNOSIS — H2511 Age-related nuclear cataract, right eye: Secondary | ICD-10-CM | POA: Diagnosis not present

## 2016-03-27 DIAGNOSIS — Z1389 Encounter for screening for other disorder: Secondary | ICD-10-CM | POA: Diagnosis not present

## 2016-03-27 DIAGNOSIS — K219 Gastro-esophageal reflux disease without esophagitis: Secondary | ICD-10-CM | POA: Diagnosis not present

## 2016-03-27 DIAGNOSIS — I6340 Cerebral infarction due to embolism of unspecified cerebral artery: Secondary | ICD-10-CM | POA: Diagnosis not present

## 2016-03-27 DIAGNOSIS — C9000 Multiple myeloma not having achieved remission: Secondary | ICD-10-CM | POA: Diagnosis not present

## 2016-03-27 DIAGNOSIS — Z125 Encounter for screening for malignant neoplasm of prostate: Secondary | ICD-10-CM | POA: Diagnosis not present

## 2016-03-27 DIAGNOSIS — C439 Malignant melanoma of skin, unspecified: Secondary | ICD-10-CM | POA: Diagnosis not present

## 2016-03-27 DIAGNOSIS — E039 Hypothyroidism, unspecified: Secondary | ICD-10-CM | POA: Diagnosis not present

## 2016-03-27 DIAGNOSIS — D696 Thrombocytopenia, unspecified: Secondary | ICD-10-CM | POA: Diagnosis not present

## 2016-03-27 DIAGNOSIS — R197 Diarrhea, unspecified: Secondary | ICD-10-CM | POA: Diagnosis not present

## 2016-03-27 DIAGNOSIS — R7309 Other abnormal glucose: Secondary | ICD-10-CM | POA: Diagnosis not present

## 2016-03-27 DIAGNOSIS — Z8546 Personal history of malignant neoplasm of prostate: Secondary | ICD-10-CM | POA: Diagnosis not present

## 2016-03-27 DIAGNOSIS — Z0000 Encounter for general adult medical examination without abnormal findings: Secondary | ICD-10-CM | POA: Diagnosis not present

## 2016-04-01 ENCOUNTER — Other Ambulatory Visit: Payer: Self-pay | Admitting: Medical Oncology

## 2016-04-01 ENCOUNTER — Other Ambulatory Visit: Payer: Self-pay | Admitting: *Deleted

## 2016-04-01 DIAGNOSIS — R197 Diarrhea, unspecified: Secondary | ICD-10-CM

## 2016-04-01 MED ORDER — DIPHENOXYLATE-ATROPINE 2.5-0.025 MG PO TABS: 2.0000 | ORAL_TABLET | Freq: Four times a day (QID) | ORAL | 0 refills | Status: DC | PRN

## 2016-04-09 ENCOUNTER — Telehealth: Payer: Self-pay | Admitting: Medical Oncology

## 2016-04-09 ENCOUNTER — Other Ambulatory Visit (HOSPITAL_BASED_OUTPATIENT_CLINIC_OR_DEPARTMENT_OTHER): Payer: Medicare Other

## 2016-04-09 DIAGNOSIS — R1310 Dysphagia, unspecified: Secondary | ICD-10-CM | POA: Diagnosis not present

## 2016-04-09 DIAGNOSIS — C9000 Multiple myeloma not having achieved remission: Secondary | ICD-10-CM

## 2016-04-09 LAB — COMPREHENSIVE METABOLIC PANEL
ALT: 14 U/L (ref 0–55)
ANION GAP: 8 meq/L (ref 3–11)
AST: 19 U/L (ref 5–34)
Albumin: 3.7 g/dL (ref 3.5–5.0)
Alkaline Phosphatase: 57 U/L (ref 40–150)
BILIRUBIN TOTAL: 0.5 mg/dL (ref 0.20–1.20)
BUN: 22.6 mg/dL (ref 7.0–26.0)
CO2: 29 meq/L (ref 22–29)
Calcium: 9.3 mg/dL (ref 8.4–10.4)
Chloride: 103 mEq/L (ref 98–109)
Creatinine: 1 mg/dL (ref 0.7–1.3)
EGFR: 67 mL/min/{1.73_m2} — AB (ref >90)
Glucose: 90 mg/dl (ref 70–140)
Potassium: 4.2 mEq/L (ref 3.5–5.1)
Sodium: 140 mEq/L (ref 136–145)
TOTAL PROTEIN: 6.3 g/dL — AB (ref 6.4–8.3)

## 2016-04-09 LAB — CBC WITH DIFFERENTIAL/PLATELET
BASO%: 0.2 % (ref 0.0–2.0)
Basophils Absolute: 0 10*3/uL (ref 0.0–0.1)
EOS ABS: 0.1 10*3/uL (ref 0.0–0.5)
EOS%: 1.5 % (ref 0.0–7.0)
HCT: 37.6 % — ABNORMAL LOW (ref 38.4–49.9)
HGB: 12.5 g/dL — ABNORMAL LOW (ref 13.0–17.1)
LYMPH%: 14.4 % (ref 14.0–49.0)
MCH: 32.9 pg (ref 27.2–33.4)
MCHC: 33.2 g/dL (ref 32.0–36.0)
MCV: 98.9 fL — AB (ref 79.3–98.0)
MONO#: 0.4 10*3/uL (ref 0.1–0.9)
MONO%: 8.9 % (ref 0.0–14.0)
NEUT%: 75 % (ref 39.0–75.0)
NEUTROS ABS: 3.1 10*3/uL (ref 1.5–6.5)
PLATELETS: 137 10*3/uL — AB (ref 140–400)
RBC: 3.8 10*6/uL — AB (ref 4.20–5.82)
RDW: 14.1 % (ref 11.0–14.6)
WBC: 4.1 10*3/uL (ref 4.0–10.3)
lymph#: 0.6 10*3/uL — ABNORMAL LOW (ref 0.9–3.3)

## 2016-04-09 LAB — LACTATE DEHYDROGENASE: LDH: 169 U/L (ref 125–245)

## 2016-04-09 NOTE — Telephone Encounter (Signed)
Called pt PCP re pt diarrhea and need to f/u with pt The patient left the office before the visit was finished. Message with Dr Sherilyn Cooter assistant-labs faxed.

## 2016-04-10 ENCOUNTER — Telehealth: Payer: Self-pay | Admitting: Medical Oncology

## 2016-04-10 ENCOUNTER — Other Ambulatory Visit: Payer: Self-pay | Admitting: Internal Medicine

## 2016-04-10 DIAGNOSIS — C9000 Multiple myeloma not having achieved remission: Secondary | ICD-10-CM

## 2016-04-10 LAB — IGG, IGA, IGM
IGG (IMMUNOGLOBIN G), SERUM: 571 mg/dL — AB (ref 700–1600)
IgA, Qn, Serum: 84 mg/dL (ref 61–437)
IgM, Qn, Serum: 26 mg/dL (ref 15–143)

## 2016-04-10 LAB — BETA 2 MICROGLOBULIN, SERUM: BETA 2: 2.4 mg/L (ref 0.6–2.4)

## 2016-04-10 LAB — KAPPA/LAMBDA LIGHT CHAINS
IG KAPPA FREE LIGHT CHAIN: 10.8 mg/L (ref 3.3–19.4)
Ig Lambda Free Light Chain: 8.6 mg/L (ref 5.7–26.3)
Kappa/Lambda FluidC Ratio: 1.26 (ref 0.26–1.65)

## 2016-04-10 NOTE — Telephone Encounter (Signed)
Wife states pt tells her he is better today.I reiterated that he f/u with PCP.

## 2016-04-10 NOTE — Telephone Encounter (Signed)
-----   Message from Karie Mainland, RD sent at 04/09/2016 11:44 AM EDT ----- Regarding: RE: diarrhea r/t/ Boost Not usually since it is lactose free. Try drinking 4 oz slowly and split up during the day. Is patient on antibiotics which more than likely is contributing.... C-diff + ?  He could try Ensure Clear or Boost Breeze. Barb ----- Message ----- From: Ardeen Garland, RN Sent: 04/09/2016  10:43 AM To: Karie Mainland, RD Subject: diarrhea r/t/ Boost                            Having diarrhea and been drinking boost , then switched to [employer] brand. PCP aware and pt calling him back today. Just wanted to know if boost can contribute to diarrhea? Giannah Zavadil

## 2016-04-15 ENCOUNTER — Encounter: Payer: Medicare Other | Attending: Physical Medicine & Rehabilitation | Admitting: Physical Medicine & Rehabilitation

## 2016-04-15 ENCOUNTER — Encounter: Payer: Self-pay | Admitting: Physical Medicine & Rehabilitation

## 2016-04-15 VITALS — BP 104/69 | HR 69 | Resp 14

## 2016-04-15 DIAGNOSIS — R4701 Aphasia: Secondary | ICD-10-CM | POA: Diagnosis not present

## 2016-04-15 DIAGNOSIS — G8220 Paraplegia, unspecified: Secondary | ICD-10-CM | POA: Diagnosis not present

## 2016-04-15 DIAGNOSIS — R269 Unspecified abnormalities of gait and mobility: Secondary | ICD-10-CM | POA: Diagnosis not present

## 2016-04-15 DIAGNOSIS — G9589 Other specified diseases of spinal cord: Secondary | ICD-10-CM | POA: Insufficient documentation

## 2016-04-15 DIAGNOSIS — I69319 Unspecified symptoms and signs involving cognitive functions following cerebral infarction: Secondary | ICD-10-CM | POA: Diagnosis not present

## 2016-04-15 DIAGNOSIS — I639 Cerebral infarction, unspecified: Secondary | ICD-10-CM | POA: Diagnosis not present

## 2016-04-15 DIAGNOSIS — D492 Neoplasm of unspecified behavior of bone, soft tissue, and skin: Secondary | ICD-10-CM | POA: Diagnosis not present

## 2016-04-15 NOTE — Progress Notes (Signed)
Subjective:    Patient ID: Jeremiah Owens, male    DOB: 16-Oct-1929, 82 y.o.   MRN: [id number]  HPI  Jeremiah Owens is here in follow up of his CVA and thoracic myelopathy. He had bilateral cataracts surgery over the Summer. He is awaiting new glasses in 2-3 weeks.   He states that his ambulation is "no different". He uses his walker all of the time when he leaves the house. He sometimes will use his cane but this is rare. He will take it with him to "[facility] class" [facility]. He walks without his cane at home now and sometimes furniture walks.   Pain Inventory Average Pain 0 Pain Right Now 0 My pain is no pain  In the last 24 hours, has pain interfered with the following? General activity 0 Relation with others 0 Enjoyment of life 0 What TIME of day is your pain at its worst? no pain Sleep (in general) Good  Pain is worse with: no pain Pain improves with: no pain Relief from Meds: no pain  Mobility walk with assistance use a cane use a walker  Function retired  Neuro/Psych bowel control problems  Prior Studies Any changes since last visit?  no  Physicians involved in your care Any changes since last visit?  no   Family History  Problem Relation Age of Onset  . Cancer Mother     breast  . Stroke Mother    Social History   Social History  . Marital status: Married    Spouse name: N/A  . Number of children: N/A  . Years of education: N/A   Social History Main Topics  . Smoking status: Never Smoker  . Smokeless tobacco: Never Used  . Alcohol use No  . Drug use: No  . Sexual activity: Not Asked   Other Topics Concern  . None   Social History Narrative  . None   Past Surgical History:  Procedure Laterality Date  . HERNIA REPAIR    . POSTERIOR CERVICAL FUSION/FORAMINOTOMY N/A 06/17/2014   Procedure: Thoracic five laminectomy for epidural tumor resection Thoracic 4-7 posterior lateral arthrodesis, segmental pedicle screw fixation.;  Surgeon: Ashok Pall, MD;  Location: [place] [facility];  Service: Neurosurgery;  Laterality: N/A;   Past Medical History:  Diagnosis Date  . Allergy   . Anxiety   . Bone cancer ([place])    t_spine T-5  . Compression fracture   . Encounter for antineoplastic chemotherapy 01/31/2015  . Hearing loss   . Melanoma ([place])   . Multiple myeloma ([place])   . Prostate cancer ([place]) 2002  . S/P radiation therapy 08/21/14-09/04/14   T4-6 25Gy/36f  . S/P radiation therapy 11/29/14-12/12/14   rt prox humerus/shoulder/scapula 25Gy/129f . Skin cancer 1994   melanoma  right neck   . Stroke ([facility]  . Thyroid disease    BP 104/69 (BP Location: Left Arm, Patient Position: Sitting, Cuff Size: Normal)   Pulse 69   Resp 14   SpO2 98%   Opioid Risk Score:   Fall Risk Score:  `1  Depression screen PHQ 2/9  [facility] 04/10/2015 11/07/2014  Decreased Interest 0 0  Down, Depressed, Hopeless 0 0  PHQ - 2 Score 0 0  Altered sleeping 1 0  Tired, decreased energy 1 2  Change in appetite 0 0  Feeling bad or failure about yourself  0 0  Trouble concentrating 0 1  Moving slowly or fidgety/restless 0 0  Suicidal  thoughts 0 0  PHQ-9 Score 2 3  Some recent data might be hidden    Review of Systems  Constitutional: Negative.   HENT: Negative.   Eyes: Negative.   Respiratory: Negative.   Gastrointestinal: Positive for diarrhea.  Endocrine: Negative.   Genitourinary: Negative.   Musculoskeletal: Positive for gait problem.  Skin: Negative.   Hematological: Negative.   Psychiatric/Behavioral: Negative.   All other systems reviewed and are negative.      Objective:   Physical Exam  Constitutional: He is oriented to person, place, and time.  HENT:  Eyes: EOM are normal.  Neck: Normal range of motion. Neck supple. No thyromegaly present.  Cardiovascular: Normal rate and regular rhythm. no murmurs, rubs  Respiratory: Effort normal and breath sounds normal. No respiratory distress.  GI: Soft. Bowel sounds are  normal. He exhibits no distension.  Neurological: He is alert and oriented to person, place, and time. He has ongoing word finding deficits, but is generally able to convey his thought and intentions with extra time.  Speech is intelligible. Mild right central 7. He is oriented to person place, date. RUE: 4/5 prox to distal. Decreased FMC RUE but improved. RLE: 4 hf, 4/5 ke, adf/apf. LLE: 4+/5. Decreased proprioception in both legs (chronic). Still ambulates with mild posterior lean but shuffling is gone. The lean disappeared with use of cane. He was quite balanced.  Skin: Skin is warm and dry.  Psychiatric: He has a normal mood and affect. His behavior is normal    Assessment/Plan:  1. Functional deficits secondary to left basal ganglia infarct  -has progressed with balance and form---can use cane only for gait -can use walker for long dx ambulation -continue HEP and balance program 2. History of multiple myeloma with thoracic spinal metastasis.  -mgt per Dr. Earlie Server  3. ?IBS----probiotic, fiber, diet changes 4. Aspiration pneumonia/dysphagia:  -continue with swallowing techniques as describes 5. BPH:  Continue Flomax  Follow up with me PRN. Fifteen minutes of face to face patient care time were spent during this visit. All questions were encouraged and answered.

## 2016-04-15 NOTE — Patient Instructions (Addendum)
PLEASE CALL ME WITH ANY PROBLEMS OR QUESTIONS [phone])   CHART YOUR DIETARY INTAKE AND BOWEL PATTERNS   CONSIDER PROBIOTICS (EITHER SUPPLEMENTS OR YOGURT) AND FIBER SUPPLEMENTS TO HELP WITH YOUR STOOL CONSISTENCY

## 2016-04-16 ENCOUNTER — Ambulatory Visit (HOSPITAL_BASED_OUTPATIENT_CLINIC_OR_DEPARTMENT_OTHER): Payer: Medicare Other

## 2016-04-16 ENCOUNTER — Telehealth: Payer: Self-pay | Admitting: *Deleted

## 2016-04-16 ENCOUNTER — Encounter: Payer: Self-pay | Admitting: Internal Medicine

## 2016-04-16 ENCOUNTER — Telehealth: Payer: Self-pay | Admitting: Internal Medicine

## 2016-04-16 ENCOUNTER — Ambulatory Visit (HOSPITAL_BASED_OUTPATIENT_CLINIC_OR_DEPARTMENT_OTHER): Payer: Medicare Other | Admitting: Internal Medicine

## 2016-04-16 VITALS — BP 105/51 | HR 79 | Temp 98.7°F | Resp 18 | Ht 69.0 in | Wt 153.3 lb

## 2016-04-16 DIAGNOSIS — C7951 Secondary malignant neoplasm of bone: Secondary | ICD-10-CM

## 2016-04-16 DIAGNOSIS — C9000 Multiple myeloma not having achieved remission: Secondary | ICD-10-CM

## 2016-04-16 DIAGNOSIS — IMO0001 Reserved for inherently not codable concepts without codable children: Secondary | ICD-10-CM

## 2016-04-16 DIAGNOSIS — C419 Malignant neoplasm of bone and articular cartilage, unspecified: Secondary | ICD-10-CM

## 2016-04-16 MED ORDER — ZOLEDRONIC ACID 4 MG/100ML IV SOLN
4.0000 mg | Freq: Once | INTRAVENOUS | Status: AC
  Administered 2016-04-16: 4 mg via INTRAVENOUS
  Filled 2016-04-16: qty 100

## 2016-04-16 MED ORDER — SODIUM CHLORIDE 0.9 % IV SOLN
Freq: Once | INTRAVENOUS | Status: AC
  Administered 2016-04-16: 11:00:00 via INTRAVENOUS

## 2016-04-16 NOTE — Patient Instructions (Signed)

## 2016-04-16 NOTE — Telephone Encounter (Signed)
Per LOS I have scheduled appts and notified the scheduler 

## 2016-04-16 NOTE — Progress Notes (Signed)
Per Dr Julien Nordmann, pt is to only receive Zometa today. Hold Velcade, and pt would like to wait until October 2017 for his flu shot.

## 2016-04-16 NOTE — Telephone Encounter (Signed)
Message sent to infusion scheduler to add infusion. Avs report and schedule given per 04/16/16 los.

## 2016-04-16 NOTE — Progress Notes (Signed)
[place] [phone]   Fax:([phone]) [place] [address] [employer] [address] [place] [address]  DIAGNOSIS: Multiple myeloma diagnosed in November 2015  PRIOR THERAPY:  1) Status post Thoracic five laminectomy for epidural tumor resection, Thoracic 4-7 posterior lateral arthrodesis,  segmental pedicle screw fixation T4-T7 Globus instrumentation under the care of Dr. Christella Noa on 06/18/2014. 2) Systemic chemotherapy with Velcade 1.3 MG/M2 subcutaneously weekly in addition to Decadron 40 mg by mouth on weekly basis. Status post 11 cycles. 3) palliative radiotherapy to the right proximal humerus, shoulder and scapula under the care of Dr. Lisbeth Renshaw completed on 12/12/2014.  CURRENT THERAPY:  1) Systemic chemotherapy with Velcade 1.3 MG/M2 subcutaneously weekly, Decadron 40 mg by mouth weekly in addition to Revlimid 25 MG by mouth daily for 21 days every 4 weeks, status post 14 cycles.  2) Zometa 4 mg IV every 12 weeks. First dose 11/01/2014.  INTERVAL HISTORY: Jeremiah Owens 82 y.o. male returns to the clinic today for follow-up visit accompanied by his wife. The patient has been observation for the last 6 months. He is feeling fine with no specific complaints except for intermittent diarrhea. He was seen by his primary care physician and started on treatment with Lomotil in addition to Imodium. He denied having any significant nausea or vomiting, no fever or chills. He denied having any significant weight loss or night sweats. He denied having any significant chest pain, shortness of breath, cough or hemoptysis. He had repeat myeloma panel performed earlier today and he is here for evaluation and discussion of his lab results.  MEDICAL HISTORY: Past Medical History:  Diagnosis Date  . Allergy   . Anxiety   . Bone cancer ([place])    t_spine T-5  . Compression fracture   . Encounter for antineoplastic chemotherapy 01/31/2015    . Hearing loss   . Melanoma ([place])   . Multiple myeloma ([place])   . Prostate cancer ([place]) 2002  . S/P radiation therapy 08/21/14-09/04/14   T4-6 25Gy/29f  . S/P radiation therapy 11/29/14-12/12/14   rt prox humerus/shoulder/scapula 25Gy/172f . Skin cancer 1994   melanoma  right neck   . Stroke ([facility] [place]  . Thyroid disease     ALLERGIES:  is allergic to iodine.  MEDICATIONS:  Current Outpatient Prescriptions  Medication Sig Dispense Refill  . acyclovir (ZOVIRAX) 400 MG tablet Take 1 tablet (400 mg total) by mouth 2 (two) times daily. 60 tablet 2  . aspirin 81 MG EC tablet Take 81 mg by mouth daily.  0  . Calcium Carbonate-Vitamin D (CALTRATE 600+D PO) Take 600 mg by mouth 2 (two) times daily.    . cholecalciferol (VITAMIN D) 1000 UNITS tablet Take 1,000 Units by mouth at bedtime.     . Marland Kitchenexamethasone (DECADRON) 4 MG tablet TAKE 10 TABS EVERY WEEK, START WITH CHEMO 40 tablet 4  . diphenoxylate-atropine (LOMOTIL) 2.5-0.025 MG tablet Take 2 tablets by mouth 4 (four) times daily as needed for diarrhea or loose stools. 30 tablet 0  . furosemide (LASIX) 20 MG tablet Take 20 mg by mouth daily. Pt takes 1 tablet daily except on Mon and Thurs takes 2 tablets    . KLOR-CON 10 10 MEQ tablet Take 10 mEq by mouth daily.    . Marland Kitchenevothyroxine (SYNTHROID, LEVOTHROID) 88 MCG tablet Take 88 mcg by mouth daily.    . Marland Kitchenoperamide (IMODIUM) 1 MG/5ML solution Take 2 mg by mouth as needed  for diarrhea or loose stools.    . methocarbamol (ROBAXIN) 500 MG tablet TAKE 1 TABLET (500 MG TOTAL) BY MOUTH EVERY 6 (SIX) HOURS AS NEEDED FOR MUSCLE SPASMS. 180 tablet 4  . Multiple Vitamins-Minerals (CENTRUM SILVER PO) Take 1 tablet by mouth daily.    . mupirocin ointment (BACTROBAN) 2 % Reported on 01/23/2016    . omeprazole (PRILOSEC) 40 MG capsule Take 40 mg by mouth daily.    . prochlorperazine (COMPAZINE) 10 MG tablet Take 10 mg by mouth daily. Reported on 01/23/2016  0  . ranitidine (ZANTAC) 150 MG tablet Take 150 mg by mouth  daily as needed for heartburn.   11  . sennosides-docusate sodium (SENOKOT-S) 8.6-50 MG tablet Take 1-2 tablets by mouth 2 (two) times daily as needed for constipation. Reported on 01/23/2016    . tamsulosin (FLOMAX) 0.4 MG CAPS capsule Take 1 capsule (0.4 mg total) by mouth at bedtime. 30 capsule 3  . warfarin (COUMADIN) 2 MG tablet Take 1 tablet (2 mg total) by mouth daily. 90 tablet 4   No current facility-administered medications for this visit.    Facility-Administered Medications Ordered in Other Visits  Medication Dose Route Frequency Provider Last Rate Last Dose  . sodium chloride 0.9 % injection 10 mL  10 mL Intracatheter PRN Curt Bears, MD        SURGICAL HISTORY:  Past Surgical History:  Procedure Laterality Date  . HERNIA REPAIR    . POSTERIOR CERVICAL FUSION/FORAMINOTOMY N/A 06/17/2014   Procedure: Thoracic five laminectomy for epidural tumor resection Thoracic 4-7 posterior lateral arthrodesis, segmental pedicle screw fixation.;  Surgeon: Ashok Pall, MD;  Location: [facility];  Service: Neurosurgery;  Laterality: N/A;    REVIEW OF SYSTEMS:  A comprehensive review of systems was negative except for: Constitutional: positive for fatigue Gastrointestinal: positive for diarrhea   PHYSICAL EXAMINATION: General appearance: alert, cooperative, fatigued and no distress Head: Normocephalic, without obvious abnormality, atraumatic Neck: no adenopathy, no JVD, supple, symmetrical, trachea midline and thyroid not enlarged, symmetric, no tenderness/mass/nodules Lymph nodes: Cervical, supraclavicular, and axillary nodes normal. Resp: clear to auscultation bilaterally Back: symmetric, no curvature. ROM normal. No CVA tenderness. Cardio: regular rate and rhythm, S1, S2 normal, no murmur, click, rub or gallop GI: soft, non-tender; bowel sounds normal; no masses,  no organomegaly Extremities: edema 2+ edema bilaterally with erythema on the legs Neurologic: Alert and oriented X 3,  normal strength and tone. Normal symmetric reflexes. Normal coordination and gait Motor: grossly normal Weakness in the left foot  ECOG PERFORMANCE STATUS: 1 - Symptomatic but completely ambulatory  Blood pressure (!) 105/51, pulse 79, temperature 98.7 F (37.1 C), resp. rate 18, height _0  (1.753 m), weight 153 lb 4.8 oz (69.5 kg), SpO2 100 %.  LABORATORY DATA: Lab Results  Component Value Date   WBC 4.1 04/09/2016   HGB 12.5 (L) 04/09/2016   HCT 37.6 (L) 04/09/2016   MCV 98.9 (H) 04/09/2016   PLT 137 (L) 04/09/2016      Chemistry      Component Value Date/Time   NA 140 04/09/2016 0955   K 4.2 04/09/2016 0955   CL 101 02/21/2015 0626   CO2 29 04/09/2016 0955   BUN 22.6 04/09/2016 0955   CREATININE 1.0 04/09/2016 0955      Component Value Date/Time   CALCIUM 9.3 04/09/2016 0955   ALKPHOS 57 04/09/2016 0955   AST 19 04/09/2016 0955   ALT 14 04/09/2016 0955   BILITOT 0.50 04/09/2016 0955  MYELOMA PANEL: Beta-2 microglobulin 2.4. IgG 571, IgA 84, IgM 26. Free kappa light chain 10.8, free lambda light chain 8.6,kappa/lambda ratio 1.26.  RADIOGRAPHIC STUDIES: No results found.  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 He completed treatment with subcutaneous Velcade and Decadron status post 11 weekly doses. This was discontinued secondary to disease progression. The patient was started on treatment with weekly subcutaneous Velcade, Revlimid for 21 days every 4 weeks in addition to Decadron 40 mg weekly status post 14 cycle  The patient has been observation for the last 6 months. He is feeling fine and his recent myeloma panel showed no evidence for disease progression. I discussed the lab result with the patient and his wife. I recommended for him to continue on observation. I will see him back for  follow-up visit in 3 months for reevaluation with repeat myeloma panel. He will proceed with the Zometa infusion every 3 months as scheduled. For the diarrhea, he will continue on Imodium and Lomotil as prescribed by his primary care physician. He was advised to call immediately if he has any concerning symptoms in the interval. The patient voices understanding of current disease status and treatment options and is in agreement with the current care plan.  All questions were answered. The patient knows to call the clinic with any problems, questions or concerns. We can certainly see the patient much sooner if necessary.  Disclaimer: This note was dictated with voice recognition software. Similar sounding words can inadvertently be transcribed and may not be corrected upon review.

## 2016-05-16 DIAGNOSIS — Z1211 Encounter for screening for malignant neoplasm of colon: Secondary | ICD-10-CM | POA: Diagnosis not present

## 2016-05-16 DIAGNOSIS — Z0000 Encounter for general adult medical examination without abnormal findings: Secondary | ICD-10-CM | POA: Diagnosis not present

## 2016-05-20 DIAGNOSIS — C9000 Multiple myeloma not having achieved remission: Secondary | ICD-10-CM | POA: Diagnosis not present

## 2016-05-21 DIAGNOSIS — Z23 Encounter for immunization: Secondary | ICD-10-CM | POA: Diagnosis not present

## 2016-05-28 DIAGNOSIS — E039 Hypothyroidism, unspecified: Secondary | ICD-10-CM | POA: Diagnosis not present

## 2016-06-18 ENCOUNTER — Encounter (HOSPITAL_COMMUNITY): Payer: Self-pay

## 2016-06-18 ENCOUNTER — Emergency Department (HOSPITAL_COMMUNITY)
Admission: EM | Admit: 2016-06-18 | Discharge: 2016-06-18 | Disposition: A | Discharge status: Home / Self Care | Payer: Medicare Other | Attending: Emergency Medicine | Admitting: Emergency Medicine

## 2016-06-18 DIAGNOSIS — N390 Urinary tract infection, site not specified: Secondary | ICD-10-CM | POA: Diagnosis not present

## 2016-06-18 DIAGNOSIS — Z8546 Personal history of malignant neoplasm of prostate: Secondary | ICD-10-CM | POA: Diagnosis not present

## 2016-06-18 DIAGNOSIS — Z85828 Personal history of other malignant neoplasm of skin: Secondary | ICD-10-CM | POA: Insufficient documentation

## 2016-06-18 DIAGNOSIS — Z7982 Long term (current) use of aspirin: Secondary | ICD-10-CM | POA: Insufficient documentation

## 2016-06-18 DIAGNOSIS — Z85830 Personal history of malignant neoplasm of bone: Secondary | ICD-10-CM | POA: Insufficient documentation

## 2016-06-18 DIAGNOSIS — R350 Frequency of micturition: Secondary | ICD-10-CM | POA: Diagnosis present

## 2016-06-18 DIAGNOSIS — Z7901 Long term (current) use of anticoagulants: Secondary | ICD-10-CM | POA: Diagnosis not present

## 2016-06-18 DIAGNOSIS — Z8673 Personal history of transient ischemic attack (TIA), and cerebral infarction without residual deficits: Secondary | ICD-10-CM | POA: Insufficient documentation

## 2016-06-18 LAB — CBC WITH DIFFERENTIAL/PLATELET
BASOS PCT: 0 %
Basophils Absolute: 0 10*3/uL (ref 0.0–0.1)
EOS ABS: 0 10*3/uL (ref 0.0–0.7)
Eosinophils Relative: 1 %
HCT: 38.3 % — ABNORMAL LOW (ref 39.0–52.0)
Hemoglobin: 12.9 g/dL — ABNORMAL LOW (ref 13.0–17.0)
Lymphocytes Relative: 14 %
Lymphs Abs: 0.8 10*3/uL (ref 0.7–4.0)
MCH: 32.4 pg (ref 26.0–34.0)
MCHC: 33.7 g/dL (ref 30.0–36.0)
MCV: 96.2 fL (ref 78.0–100.0)
Monocytes Absolute: 0.3 10*3/uL (ref 0.1–1.0)
Monocytes Relative: 5 %
NEUTROS ABS: 4.3 10*3/uL (ref 1.7–7.7)
NEUTROS PCT: 80 %
Platelets: 125 10*3/uL — ABNORMAL LOW (ref 150–400)
RBC: 3.98 MIL/uL — AB (ref 4.22–5.81)
RDW: 13.3 % (ref 11.5–15.5)
WBC: 5.4 10*3/uL (ref 4.0–10.5)

## 2016-06-18 LAB — URINALYSIS, ROUTINE W REFLEX MICROSCOPIC
Bilirubin Urine: NEGATIVE
Glucose, UA: NEGATIVE mg/dL
Ketones, ur: NEGATIVE mg/dL
NITRITE: NEGATIVE
Protein, ur: NEGATIVE mg/dL
SPECIFIC GRAVITY, URINE: 1.013 (ref 1.005–1.030)
pH: 7 (ref 5.0–8.0)

## 2016-06-18 LAB — BASIC METABOLIC PANEL
ANION GAP: 8 (ref 5–15)
BUN: 20 mg/dL (ref 6–20)
CALCIUM: 9.5 mg/dL (ref 8.9–10.3)
CHLORIDE: 101 mmol/L (ref 101–111)
CO2: 29 mmol/L (ref 22–32)
CREATININE: 0.95 mg/dL (ref 0.61–1.24)
GFR calc non Af Amer: >60 mL/min (ref >60)
Glucose, Bld: 97 mg/dL (ref 65–99)
Potassium: 3.9 mmol/L (ref 3.5–5.1)
SODIUM: 138 mmol/L (ref 135–145)

## 2016-06-18 LAB — URINE MICROSCOPIC-ADD ON
RBC / HPF: NONE SEEN RBC/hpf (ref 0–5)
SQUAMOUS EPITHELIAL / LPF: NONE SEEN

## 2016-06-18 MED ORDER — CEPHALEXIN 500 MG PO CAPS
500.0000 mg | ORAL_CAPSULE | Freq: Once | ORAL | Status: AC
Start: 2016-06-18 — End: 2016-06-18
  Administered 2016-06-18: 500 mg via ORAL
  Filled 2016-06-18: qty 1

## 2016-06-18 MED ORDER — CEPHALEXIN 500 MG PO CAPS: 500.0000 mg | ORAL_CAPSULE | Freq: Two times a day (BID) | ORAL | 0 refills | Status: DC

## 2016-06-18 NOTE — ED Notes (Signed)
PT DISCHARGED. INSTRUCTIONS AND PRESCRIPTION GIVEN. AAOX4. PT IN NO APPARENT DISTRESS OR PAIN. THE OPPORTUNITY TO ASK QUESTIONS WAS PROVIDED. 

## 2016-06-18 NOTE — ED Triage Notes (Signed)
He reports urinary frequency for past few days. He cites hx of prostate cancer and multiple myeloma. He is not being actively treated for either condition.

## 2016-06-18 NOTE — ED Notes (Signed)
Pt given urinal and informed that we need a urine sample. Pt does not have to go at this time.

## 2016-06-18 NOTE — ED Notes (Signed)
Bed: WA22 Expected date:  Expected time:  Means of arrival:  Comments: 

## 2016-06-18 NOTE — ED Provider Notes (Signed)
[facility] DEPT Provider Note   CSN: [id number] Arrival date & time: 06/18/16  1027     History   Chief Complaint Chief Complaint  Patient presents with  . Urinary Frequency    HPI Jeremiah Owens is a 82 y.o. male.  HPI  82 year old male presents with urinary frequency and dysuria for the last few days. States he is frequently urinating but only a small amount comes out. Has some chronic dribbling so usually wears a diaper but this is a new symptom otherwise. No abdominal pain, nausea, vomiting, fevers, or back pain. Has a history of prostate cancer that he states is in remission. Also has a history of multiple myeloma for which he is currently being followed. No penile or scrotal pain. No discharge  Past Medical History:  Diagnosis Date  . Allergy   . Anxiety   . Bone cancer ([place])    t_spine T-5  . Compression fracture   . Encounter for antineoplastic chemotherapy 01/31/2015  . Hearing loss   . Melanoma ( [place])   . Multiple myeloma ([place])   . Prostate cancer ([place]) 2002  . S/P radiation therapy 08/21/14-09/04/14   T4-6 25Gy/54f  . S/P radiation therapy 11/29/14-12/12/14   rt prox humerus/shoulder/scapula 25Gy/172f . Skin cancer 1994   melanoma  right neck   . Stroke ([facility] [place]  . Thyroid disease     Patient Active Problem List   Diagnosis Date Noted  . Multiple myeloma not having achieved remission (HCUnity03/22/2017  . Dysphagia 07/10/2015  . Diarrhea 04/25/2015  . Cancer associated pain 04/25/2015  . Long term current use of anticoagulant therapy 04/25/2015  . Basal ganglia infarction ([facility]CCount[facility] Club07/06/2015  . Dizziness   . Expressive aphasia   . Past pointing   . Right leg weakness   . Acute ischemic stroke ([facility]CAvila [place]07/05/2015  . CVA (cerebral infarction) 02/11/2015  . Aspiration pneumonia (HCGage07/05/2015  . Aphasia   . Difficulty speaking   . Speech difficult to understand   . Stroke (HCSyl[place]  . Encounter for antineoplastic chemotherapy 01/31/2015  . Constipation  12/20/2014  . Rash 12/20/2014  . Peripheral edema 12/06/2014  . Gait disorder 09/05/2014  . Multiple myeloma ([facility]  . Paraplegia ([facility]CM[facility]11/18/2015  . Postoperative anemia due to acute blood loss 06/21/2014  . 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 Spine metastasis (HCMunson11/14/2015  . Thoracic spine tumor 06/17/2014  . Metastatic bone cancer (HCLees[place]11/07/2014    Past Surgical History:  Procedure Laterality Date  . HERNIA REPAIR    . POSTERIOR CERVICAL FUSION/FORAMINOTOMY N/A 06/17/2014   Procedure: Thoracic five laminectomy for epidural tumor resection Thoracic 4-7 posterior lateral arthrodesis, segmental pedicle screw fixation.;  Surgeon: KyAshok PallMD;  [facility];  Service: Neurosurgery;  Laterality: N/A;       Home Medications    Prior to Admission medications   Medication Sig Start Date End Date Taking? Authorizing Provider  aspirin 81 MG EC tablet Take 81 mg by mouth daily. 03/29/15  Yes Historical Provider, MD  Calcium Carbonate-Vitamin D (CALTRATE 600+D PO) Take 600 mg by mouth 2 (two) times daily.   Yes Historical Provider, MD  cholecalciferol (VITAMIN D) 1000 UNITS tablet Take 1,000 Units by mouth at bedtime.    Yes Historical Provider, MD  diphenoxylate-atropine (LOMOTIL) 2.5-0.025 MG tablet Take 2 tablets by mouth 4 (four) times daily as needed for diarrhea or loose stools. 04/01/16  Yes MoCurt BearsMD  furosemide (LASIX) 20 MG tablet Take  20 mg by mouth daily. Pt takes 1 tablet daily except on Mon and Thurs takes 2 tablets   Yes Historical Provider, MD  KLOR-CON 10 10 MEQ tablet Take 10 mEq by mouth daily. 03/21/15  Yes Historical Provider, MD  levothyroxine (SYNTHROID, LEVOTHROID) 100 MCG tablet Take 100 mcg by mouth daily before breakfast.   Yes Historical Provider, MD  loperamide (IMODIUM) 1 MG/5ML solution Take 2 mg by mouth as needed for diarrhea or loose stools.   Yes Historical Provider, MD  methocarbamol (ROBAXIN) 500 MG tablet TAKE 1 TABLET  (500 MG TOTAL) BY MOUTH EVERY 6 (SIX) HOURS AS NEEDED FOR MUSCLE SPASMS. 03/26/15  Yes Meredith Staggers, MD  Multiple Vitamins-Minerals (CENTRUM SILVER PO) Take 1 tablet by mouth daily.   Yes Historical Provider, MD  omeprazole (PRILOSEC) 40 MG capsule Take 40 mg by mouth daily as needed (heartburn).    Yes Historical Provider, MD  ranitidine (ZANTAC) 150 MG tablet Take 150 mg by mouth daily as needed for heartburn.  05/23/14  Yes Historical Provider, MD  sennosides-docusate sodium (SENOKOT-S) 8.6-50 MG tablet Take 1-2 tablets by mouth 2 (two) times daily as needed for constipation. Reported on 01/23/2016   Yes Historical Provider, MD  tamsulosin (FLOMAX) 0.4 MG CAPS capsule Take 1 capsule (0.4 mg total) by mouth at bedtime. 02/21/15  Yes Daniel J Angiulli, PA-C  warfarin (COUMADIN) 2 MG tablet Take 1 tablet (2 mg total) by mouth daily. 03/22/15  Yes Curt Bears, MD  acyclovir (ZOVIRAX) 400 MG tablet Take 1 tablet (400 mg total) by mouth 2 (two) times daily. Patient not taking: Reported on 06/18/2016 01/04/16   Curt Bears, MD  cephALEXin (KEFLEX) 500 MG capsule Take 1 capsule (500 mg total) by mouth 2 (two) times daily. 06/18/16   Sherwood Gambler, MD  dexamethasone (DECADRON) 4 MG tablet TAKE 10 TABS EVERY WEEK, START WITH CHEMO Patient not taking: Reported on 06/18/2016 09/28/15   Curt Bears, MD    Family History Family History  Problem Relation Age of Onset  . Cancer Mother     breast  . Stroke Mother     Social History Social History  Substance Use Topics  . Smoking status: Never Smoker  . Smokeless tobacco: Never Used  . Alcohol use No     Allergies   Iodine   Review of Systems Review of Systems  Constitutional: Negative for fever.  Gastrointestinal: Negative for abdominal pain.  Genitourinary: Positive for dysuria, frequency and urgency. Negative for penile pain, penile swelling, scrotal swelling and testicular pain.  Musculoskeletal: Negative for back pain.  All  other systems reviewed and are negative.    Physical Exam Updated Vital Signs BP 126/73 (BP Location: Right Arm)   Pulse 76   Temp 97.9 F (36.6 C) (Oral)   Resp 16   Ht '5\' 9"'  (1.753 m)   Wt 140 lb (63.5 kg)   SpO2 99%   BMI 20.67 kg/m   Physical Exam  Constitutional: He is oriented to person, place, and time. He appears well-developed and well-nourished.  HENT:  Head: Normocephalic and atraumatic.  Right Ear: External ear normal.  Left Ear: External ear normal.  Nose: Nose normal.  Eyes: Right eye exhibits no discharge. Left eye exhibits no discharge.  Neck: Neck supple.  Cardiovascular: Normal rate, regular rhythm and normal heart sounds.   Pulmonary/Chest: Effort normal and breath sounds normal.  Abdominal: Soft. He exhibits no distension. There is no tenderness. There is no CVA tenderness.  Genitourinary: Testes  normal and penis normal. Prostate is not tender. Right testis shows no tenderness. Left testis shows no tenderness. Circumcised. No penile tenderness. No discharge found.  Genitourinary Comments: Brown stool on exam. No tenderness or bogginess to prostate  Musculoskeletal: He exhibits no edema.  Neurological: He is alert and oriented to person, place, and time.  Skin: Skin is warm and dry.  Nursing note and vitals reviewed.    ED Treatments / Results  Labs (all labs ordered are listed, but only abnormal results are displayed) Labs Reviewed  CBC WITH DIFFERENTIAL/PLATELET - Abnormal; Notable for the following:       Result Value   RBC 3.98 (*)    Hemoglobin 12.9 (*)    HCT 38.3 (*)    Platelets 125 (*)    All other components within normal limits  URINALYSIS, ROUTINE W REFLEX MICROSCOPIC (NOT AT [facility]) - Abnormal; Notable for the following:    APPearance CLOUDY (*)    Hgb urine dipstick TRACE (*)    Leukocytes, UA TRACE (*)    All other components within normal limits  URINE MICROSCOPIC-ADD ON - Abnormal; Notable for the following:    Bacteria, UA MANY  (*)    All other components within normal limits  URINE CULTURE  BASIC METABOLIC PANEL    EKG  EKG Interpretation None       Radiology No results found.  Procedures Procedures (including critical care time)  Medications Ordered in ED Medications  cephALEXin (KEFLEX) capsule 500 mg (not administered)     Initial Impression / Assessment and Plan / ED Course  I have reviewed the triage vital signs and the nursing notes.  Pertinent labs & imaging results that were available during my care of the patient were reviewed by me and considered in my medical decision making (see chart for details).  Clinical Course as of Jun 19 1255  Wed Jun 18, 2016  1119 Probably a UTI. Will get labs given history of multiple myeloma. Exam not c/w prostatitis.  [SG]    Clinical Course User Index [SG] Sherwood Gambler, MD    Patient symptoms are most consistent with a lower UTI. Send urine for culture given age, treat with Keflex. Follow-up with PCP in 2 days if not improving. Discussed return precautions. Very low suspicion for ascending tract disease or systemic infection.  Final Clinical Impressions(s) / ED Diagnoses   Final diagnoses:  Acute lower UTI (urinary tract infection)    New Prescriptions New Prescriptions   CEPHALEXIN (KEFLEX) 500 MG CAPSULE    Take 1 capsule (500 mg total) by mouth 2 (two) times daily.     Sherwood Gambler, MD 06/18/16 1256

## 2016-06-19 ENCOUNTER — Telehealth: Payer: Self-pay | Admitting: *Deleted

## 2016-06-19 ENCOUNTER — Other Ambulatory Visit: Payer: Self-pay | Admitting: Physical Medicine & Rehabilitation

## 2016-06-19 DIAGNOSIS — C9000 Multiple myeloma not having achieved remission: Secondary | ICD-10-CM

## 2016-06-19 MED ORDER — WARFARIN SODIUM 2 MG PO TABS: 2.0000 mg | ORAL_TABLET | Freq: Every day | ORAL | 4 refills | Status: DC

## 2016-06-19 NOTE — Telephone Encounter (Signed)
Received call @ 205  Pt. wife called requested for a refill on his  WARFARIN 2mg 

## 2016-06-20 DIAGNOSIS — N3000 Acute cystitis without hematuria: Secondary | ICD-10-CM | POA: Diagnosis not present

## 2016-06-20 LAB — URINE CULTURE: Culture: >=100000 — AB

## 2016-06-21 ENCOUNTER — Telehealth (HOSPITAL_BASED_OUTPATIENT_CLINIC_OR_DEPARTMENT_OTHER): Payer: Self-pay

## 2016-06-21 NOTE — Telephone Encounter (Signed)
Post ED Visit - Positive Culture Follow-up  Culture report reviewed by antimicrobial stewardship pharmacist:  []  Elenor Quinones, Pharm.D. []  Heide Guile, Pharm.D., BCPS []  Parks Neptune, Pharm.D. []  Alycia Rossetti, Pharm.D., BCPS []  [place], Pharm.D., BCPS, AAHIVP []  Legrand Como, Pharm.D., BCPS, AAHIVP []  Milus Glazier, Pharm.D. []  Stephens November, [place].D. Ebony Hail, Masters Pharm D Positive urine culture Treated with Cephalexin, organism sensitive to the same and no further patient follow-up is required at this time.  Genia Del 06/21/2016, 10:06 AM

## 2016-06-27 ENCOUNTER — Telehealth: Payer: Self-pay

## 2016-06-27 DIAGNOSIS — C9000 Multiple myeloma not having achieved remission: Secondary | ICD-10-CM

## 2016-06-27 MED ORDER — WARFARIN SODIUM 2 MG PO TABS: 2.0000 mg | ORAL_TABLET | Freq: Every day | ORAL | 3 refills | Status: DC

## 2016-06-27 NOTE — Telephone Encounter (Signed)
Wife called with problem with warfarin rx. Noted it was sent to [facility], and "no print" option was selected. [facility] Pt has 9 tablets at home.

## 2016-07-04 DIAGNOSIS — L720 Epidermal cyst: Secondary | ICD-10-CM | POA: Diagnosis not present

## 2016-07-04 DIAGNOSIS — L570 Actinic keratosis: Secondary | ICD-10-CM | POA: Diagnosis not present

## 2016-07-04 DIAGNOSIS — D1801 Hemangioma of skin and subcutaneous tissue: Secondary | ICD-10-CM | POA: Diagnosis not present

## 2016-07-04 DIAGNOSIS — L565 Disseminated superficial actinic porokeratosis (DSAP): Secondary | ICD-10-CM | POA: Diagnosis not present

## 2016-07-04 DIAGNOSIS — D225 Melanocytic nevi of trunk: Secondary | ICD-10-CM | POA: Diagnosis not present

## 2016-07-04 DIAGNOSIS — Z85828 Personal history of other malignant neoplasm of skin: Secondary | ICD-10-CM | POA: Diagnosis not present

## 2016-07-04 DIAGNOSIS — Q828 Other specified congenital malformations of skin: Secondary | ICD-10-CM | POA: Diagnosis not present

## 2016-07-15 DIAGNOSIS — E039 Hypothyroidism, unspecified: Secondary | ICD-10-CM | POA: Diagnosis not present

## 2016-07-16 ENCOUNTER — Ambulatory Visit (HOSPITAL_BASED_OUTPATIENT_CLINIC_OR_DEPARTMENT_OTHER): Payer: Medicare Other | Admitting: Internal Medicine

## 2016-07-16 ENCOUNTER — Encounter: Payer: Self-pay | Admitting: Internal Medicine

## 2016-07-16 ENCOUNTER — Ambulatory Visit (HOSPITAL_BASED_OUTPATIENT_CLINIC_OR_DEPARTMENT_OTHER): Payer: Medicare Other

## 2016-07-16 ENCOUNTER — Other Ambulatory Visit (HOSPITAL_BASED_OUTPATIENT_CLINIC_OR_DEPARTMENT_OTHER): Payer: Medicare Other

## 2016-07-16 VITALS — BP 143/54 | HR 68 | Temp 97.5°F | Resp 18 | Ht 69.0 in | Wt 156.3 lb

## 2016-07-16 DIAGNOSIS — C419 Malignant neoplasm of bone and articular cartilage, unspecified: Secondary | ICD-10-CM

## 2016-07-16 DIAGNOSIS — I10 Essential (primary) hypertension: Secondary | ICD-10-CM | POA: Diagnosis not present

## 2016-07-16 DIAGNOSIS — C9000 Multiple myeloma not having achieved remission: Secondary | ICD-10-CM | POA: Diagnosis not present

## 2016-07-16 DIAGNOSIS — C7951 Secondary malignant neoplasm of bone: Secondary | ICD-10-CM

## 2016-07-16 DIAGNOSIS — D696 Thrombocytopenia, unspecified: Secondary | ICD-10-CM

## 2016-07-16 DIAGNOSIS — D630 Anemia in neoplastic disease: Secondary | ICD-10-CM | POA: Diagnosis not present

## 2016-07-16 LAB — CBC WITH DIFFERENTIAL/PLATELET
BASO%: 0.3 % (ref 0.0–2.0)
Basophils Absolute: 0 10*3/uL (ref 0.0–0.1)
EOS%: 1.6 % (ref 0.0–7.0)
Eosinophils Absolute: 0.1 10*3/uL (ref 0.0–0.5)
HEMATOCRIT: 37.6 % — AB (ref 38.4–49.9)
HGB: 12.3 g/dL — ABNORMAL LOW (ref 13.0–17.1)
LYMPH#: 0.7 10*3/uL — AB (ref 0.9–3.3)
LYMPH%: 15.4 % (ref 14.0–49.0)
MCH: 31.6 pg (ref 27.2–33.4)
MCHC: 32.7 g/dL (ref 32.0–36.0)
MCV: 96.4 fL (ref 79.3–98.0)
MONO#: 0.3 10*3/uL (ref 0.1–0.9)
MONO%: 7.7 % (ref 0.0–14.0)
NEUT%: 75 % (ref 39.0–75.0)
NEUTROS ABS: 3.2 10*3/uL (ref 1.5–6.5)
PLATELETS: 128 10*3/uL — AB (ref 140–400)
RBC: 3.9 10*6/uL — AB (ref 4.20–5.82)
RDW: 14 % (ref 11.0–14.6)
WBC: 4.2 10*3/uL (ref 4.0–10.3)

## 2016-07-16 LAB — LACTATE DEHYDROGENASE: LDH: 168 U/L (ref 125–245)

## 2016-07-16 LAB — COMPREHENSIVE METABOLIC PANEL
ALT: 15 U/L (ref 0–55)
ANION GAP: 8 meq/L (ref 3–11)
AST: 20 U/L (ref 5–34)
Albumin: 3.9 g/dL (ref 3.5–5.0)
Alkaline Phosphatase: 57 U/L (ref 40–150)
BILIRUBIN TOTAL: 0.54 mg/dL (ref 0.20–1.20)
BUN: 21.9 mg/dL (ref 7.0–26.0)
CO2: 27 meq/L (ref 22–29)
CREATININE: 0.9 mg/dL (ref 0.7–1.3)
Calcium: 9.5 mg/dL (ref 8.4–10.4)
Chloride: 106 mEq/L (ref 98–109)
EGFR: 78 mL/min/{1.73_m2} — ABNORMAL LOW (ref >90)
Glucose: 93 mg/dl (ref 70–140)
Potassium: 4.3 mEq/L (ref 3.5–5.1)
Sodium: 141 mEq/L (ref 136–145)
TOTAL PROTEIN: 6.5 g/dL (ref 6.4–8.3)

## 2016-07-16 MED ORDER — SODIUM CHLORIDE 0.9 % IV SOLN
Freq: Once | INTRAVENOUS | Status: AC
  Administered 2016-07-16: 12:00:00 via INTRAVENOUS

## 2016-07-16 MED ORDER — ZOLEDRONIC ACID 4 MG/100ML IV SOLN
4.0000 mg | Freq: Once | INTRAVENOUS | Status: AC
  Administered 2016-07-16: 4 mg via INTRAVENOUS
  Filled 2016-07-16: qty 100

## 2016-07-16 NOTE — Patient Instructions (Signed)

## 2016-07-16 NOTE — Progress Notes (Signed)
[place] Telephone:([phone]   Fax:(336) [place] [address] [address]  DIAGNOSIS: Multiple myeloma diagnosed in November 2015.  PRIOR THERAPY: 1) Status post Thoracic five laminectomy for epidural tumor resection, Thoracic 4-7 posterior lateral arthrodesis,  segmental pedicle screw fixation T4-T7 Globus instrumentation under the care of Dr. Christella Noa on 06/18/2014. 2) Systemic chemotherapy with Velcade 1.3 MG/M2 subcutaneously weekly in addition to Decadron 40 mg by mouth on weekly basis. Status post 11 cycles. 3) palliative radiotherapy to the right proximal humerus, shoulder and scapula under the care of Dr. Lisbeth Renshaw completed on 12/12/2014. 4)  Systemic chemotherapy with Velcade 1.3 MG/M2 subcutaneously weekly, Decadron 40 mg by mouth weekly in addition to Revlimid 25 MG by mouth daily for 21 days every 4 weeks, status post 14 cycles.   CURRENT THERAPY: Zometa 4 mg IV every 12 weeks. First dose was given 11/01/2014.  INTERVAL HISTORY: Jeremiah Owens 82 y.o. male returns to the clinic today for follow-up visit accompanied by his wife. The patient continues to complain of fatigue and generalized weakness. He also has arthritis of the left shoulder and scheduled to see his orthopedic surgeon next week. He denied having any significant weight loss or night sweats. He has no nausea, vomiting, diarrhea or constipation. He has no chest pain, shortness of breath, cough or hemoptysis. He is currently on observation. He had repeat myeloma panel performed earlier today and he is here for evaluation and discussion of his lab results.  MEDICAL HISTORY: Past Medical History:  Diagnosis Date  . Allergy   . Anxiety   . Bone cancer ([place])    t_spine T-5  . Compression fracture   . Encounter for antineoplastic chemotherapy 01/31/2015  . Hearing loss   . Melanoma ([place])   . Multiple myeloma ([place])   .  Prostate cancer ([place]) 2002  . S/P radiation therapy 08/21/14-09/04/14   T4-6 25Gy/70f  . S/P radiation therapy 11/29/14-12/12/14   rt prox humerus/shoulder/scapula 25Gy/111f . Skin cancer 1994   melanoma  right neck   . [place]  . Thyroid disease     ALLERGIES:  is allergic to iodine.  MEDICATIONS:  Current Outpatient Prescriptions  Medication Sig Dispense Refill  . aspirin 81 MG EC tablet Take 81 mg by mouth daily.  0  . Calcium Carbonate-Vitamin D (CALTRATE 600+D PO) Take 600 mg by mouth 2 (two) times daily.    . cholecalciferol (VITAMIN D) 1000 UNITS tablet Take 1,000 Units by mouth at bedtime.     . furosemide (LASIX) 20 MG tablet Take 20 mg by mouth daily. Pt takes 1 tablet daily except on Mon and Thurs takes 2 tablets    . KLOR-CON 10 10 MEQ tablet Take 10 mEq by mouth daily.    . Marland Kitchenevothyroxine (SYNTHROID, LEVOTHROID) 100 MCG tablet Take 100 mcg by mouth daily before breakfast.    . loperamide (IMODIUM) 1 MG/5ML solution Take 2 mg by mouth as needed for diarrhea or loose stools.    . methocarbamol (ROBAXIN) 500 MG tablet TAKE 1 TABLET (500 MG TOTAL) BY MOUTH EVERY 6 (SIX) HOURS AS NEEDED FOR MUSCLE SPASMS. 180 tablet 4  . Multiple Vitamins-Minerals (CENTRUM SILVER PO) Take 1 tablet by mouth daily.    . Marland Kitchenmeprazole (PRILOSEC) 40 MG capsule Take 40 mg by mouth daily as needed (heartburn).     . ranitidine (ZANTAC) 150 MG tablet Take 150 mg by  mouth daily as needed for heartburn.   11  . sennosides-docusate sodium (SENOKOT-S) 8.6-50 MG tablet Take 1-2 tablets by mouth 2 (two) times daily as needed for constipation. Reported on 01/23/2016    . tamsulosin (FLOMAX) 0.4 MG CAPS capsule Take 1 capsule (0.4 mg total) by mouth at bedtime. 30 capsule 3  . acyclovir (ZOVIRAX) 400 MG tablet Take 1 tablet (400 mg total) by mouth 2 (two) times daily. (Patient not taking: Reported on 07/16/2016) 60 tablet 2  . diphenoxylate-atropine (LOMOTIL) 2.5-0.025 MG tablet Take 2 tablets by mouth 4 (four)  times daily as needed for diarrhea or loose stools. (Patient not taking: Reported on 07/16/2016) 30 tablet 0  . warfarin (COUMADIN) 2 MG tablet Take 1 tablet (2 mg total) by mouth daily. 90 tablet 3   No current facility-administered medications for this visit.    Facility-Administered Medications Ordered in Other Visits  Medication Dose Route Frequency Provider Last Rate Last Dose  . sodium chloride 0.9 % injection 10 mL  10 mL Intracatheter PRN Curt Bears, MD        SURGICAL HISTORY:  Past Surgical History:  Procedure Laterality Date  . HERNIA REPAIR    . POSTERIOR CERVICAL FUSION/FORAMINOTOMY N/A 06/17/2014   Procedure: Thoracic five laminectomy for epidural tumor resection Thoracic 4-7 posterior lateral arthrodesis, segmental pedicle screw fixation.;  Surgeon: Ashok Pall, MD;  Location: [facility];  Service: Neurosurgery;  Laterality: N/A;    REVIEW OF SYSTEMS:  Constitutional: positive for fatigue Eyes: negative Ears, nose, mouth, throat, and face: negative Respiratory: negative Cardiovascular: negative Gastrointestinal: negative Genitourinary:negative Integument/breast: negative Hematologic/lymphatic: negative Musculoskeletal:positive for muscle weakness Neurological: negative Behavioral/Psych: negative Endocrine: negative Allergic/Immunologic: negative   PHYSICAL EXAMINATION: General appearance: alert, cooperative, fatigued and no distress Head: Normocephalic, without obvious abnormality, atraumatic Neck: no adenopathy, no JVD, supple, symmetrical, trachea midline and thyroid not enlarged, symmetric, no tenderness/mass/nodules Lymph nodes: Cervical, supraclavicular, and axillary nodes normal. Resp: clear to auscultation bilaterally Back: symmetric, no curvature. ROM normal. No CVA tenderness. Cardio: regular rate and rhythm, S1, S2 normal, no murmur, click, rub or gallop GI: soft, non-tender; bowel sounds normal; no masses,  no organomegaly Extremities:  extremities normal, atraumatic, no cyanosis or edema Neurologic: Alert and oriented X 3, normal strength and tone. Normal symmetric reflexes. Normal coordination and gait  ECOG PERFORMANCE STATUS: 1 - Symptomatic but completely ambulatory  Blood pressure (!) 143/54, pulse 68, temperature 97.5 F (36.4 C), temperature source Oral, resp. rate 18, height '5\' 9"'  (1.753 m), weight 156 lb 4.8 oz (70.9 kg), SpO2 100 %.  LABORATORY DATA: Lab Results  Component Value Date   WBC 4.2 07/16/2016   HGB 12.3 (L) 07/16/2016   HCT 37.6 (L) 07/16/2016   MCV 96.4 07/16/2016   PLT 128 (L) 07/16/2016      Chemistry      Component Value Date/Time   NA 141 07/16/2016 1007   K 4.3 07/16/2016 1007   CL 101 06/18/2016 1140   CO2 27 07/16/2016 1007   BUN 21.9 07/16/2016 1007   CREATININE 0.9 07/16/2016 1007      Component Value Date/Time   CALCIUM 9.5 07/16/2016 1007   ALKPHOS 57 07/16/2016 1007   AST 20 07/16/2016 1007   ALT 15 07/16/2016 1007   BILITOT 0.54 07/16/2016 1007       RADIOGRAPHIC STUDIES: No results found.  ASSESSMENT AND PLAN: This is a very pleasant 82 years old white male with: 1) multiple myeloma diagnosed in November 2015 status post several  chemotherapy regimens and radiation. He is currently on observation. He has been doing fine. His myeloma panel still pending. If the myeloma panel showed no evidence for disease progression, I would see the patient back for follow-up visit in 3 months with repeat myeloma panel again. I also recommended for the patient to continue his current treatment with Zometa every 3 months. 2) anemia of chronic disease: His hemoglobin and hematocrit are stable. We will continue to monitor for now. I will consider the patient for transfusion in the future if his hemoglobin is less than 8.0 G/DL. 3) thrombocytopenia: We will monitor for now. 4) hypertension: Stable. The patient was advised to call immediately if he has any concerning symptoms in the  interval. The patient voices understanding of current disease status and treatment options and is in agreement with the current care plan.  All questions were answered. The patient knows to call the clinic with any problems, questions or concerns. We can certainly see the patient much sooner if necessary.  I spent 15 minutes counseling the patient face to face. The total time spent in the appointment was 25 minutes.  Disclaimer: This note was dictated with voice recognition software. Similar sounding words can inadvertently be transcribed and may not be corrected upon review.

## 2016-07-17 LAB — IGG, IGA, IGM
IGA/IMMUNOGLOBULIN A, SERUM: 83 mg/dL (ref 61–437)
IGG (IMMUNOGLOBIN G), SERUM: 678 mg/dL — AB (ref 700–1600)
IGM (IMMUNOGLOBIN M), SRM: 41 mg/dL (ref 15–143)

## 2016-07-17 LAB — KAPPA/LAMBDA LIGHT CHAINS
IG KAPPA FREE LIGHT CHAIN: 10.6 mg/L (ref 3.3–19.4)
Ig Lambda Free Light Chain: 9 mg/L (ref 5.7–26.3)
Kappa/Lambda FluidC Ratio: 1.18 (ref 0.26–1.65)

## 2016-07-17 LAB — BETA 2 MICROGLOBULIN, SERUM: BETA 2: 2.6 mg/L — AB (ref 0.6–2.4)

## 2016-07-18 DIAGNOSIS — M19012 Primary osteoarthritis, left shoulder: Secondary | ICD-10-CM | POA: Diagnosis not present

## 2016-07-21 ENCOUNTER — Telehealth: Payer: Self-pay | Admitting: Medical Oncology

## 2016-07-21 NOTE — Telephone Encounter (Signed)
Requests myeloma panel -per Julien Nordmann I told wife the labs ere fine and to f/u in 3 months.

## 2016-08-28 IMAGING — CT CT HEAD W/O CM
2 series · 15 of 30 positions shown, 19 images · non-contrast
Comparison: None.

CLINICAL DATA: Difficulty with speech with dizziness. Chronic right
leg weakness.

EXAM:
CT HEAD WITHOUT CONTRAST
TECHNIQUE: Contiguous axial images were obtained from the base of the skull
through the vertex without intravenous contrast.

[Series 2: head w/o · axial · non-contrast · 0.47mm/px · z∈[-145,-20]mm · 13 of 31 slices shown, 17 images]
[im 3/31  brain]
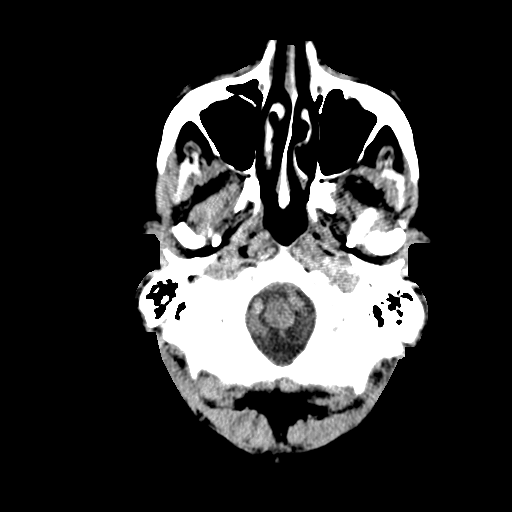
[im 3/31  bone]
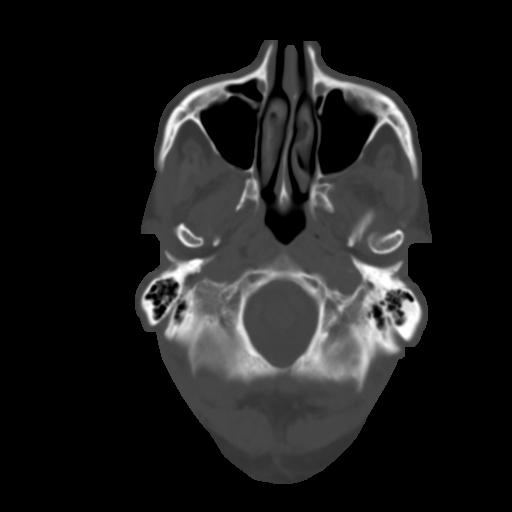
[im 5/31  brain]
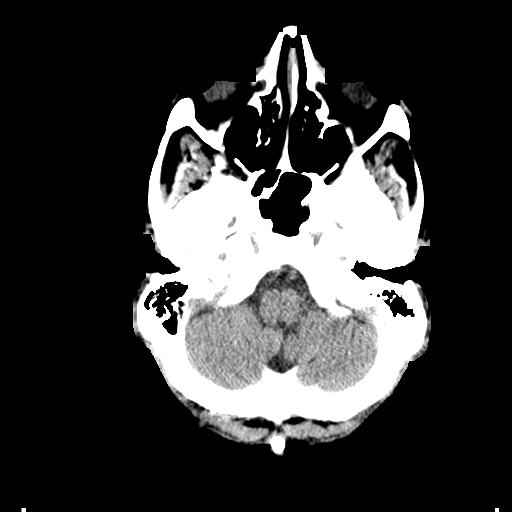
[im 7/31  brain]
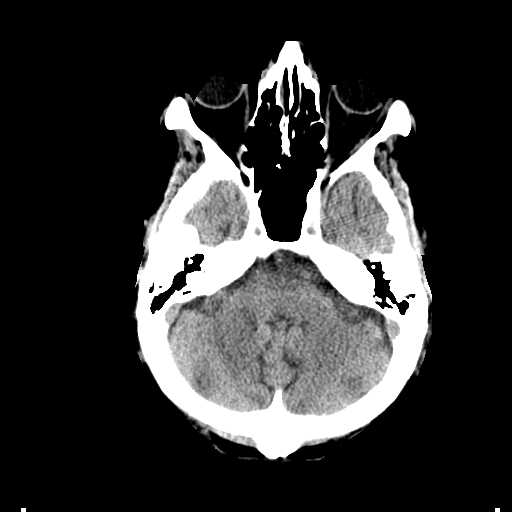
[im 9/31  brain]
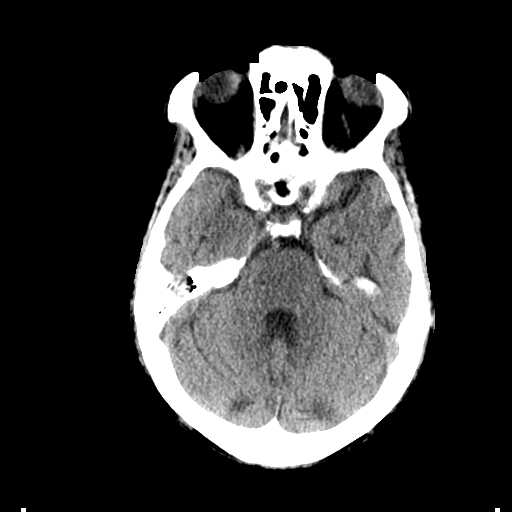
[im 11/31  brain]
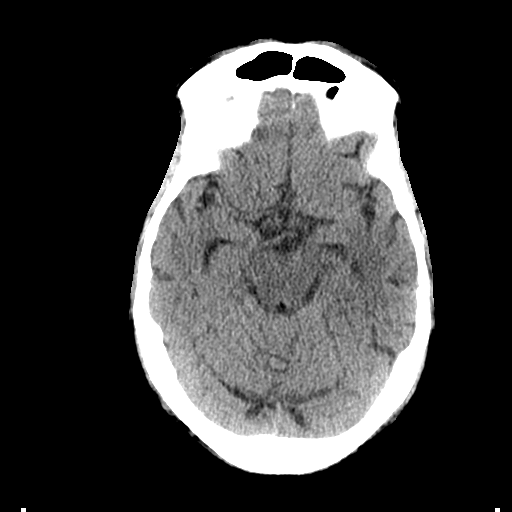
[im 11/31  bone]
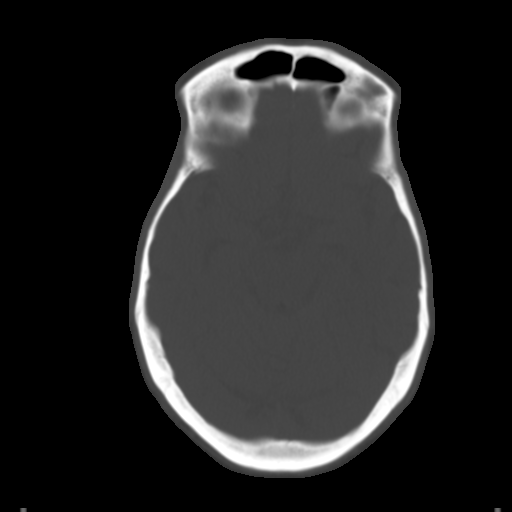
[im 13/31  brain]
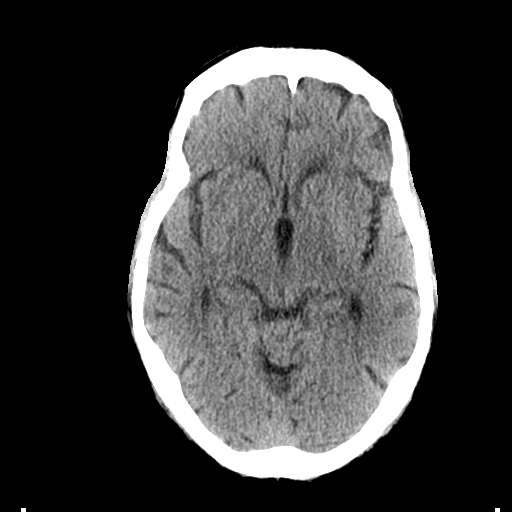
[im 16/31  brain]
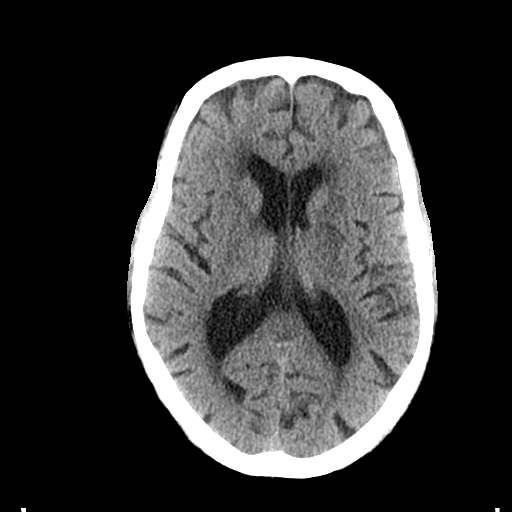
[im 18/31  brain]
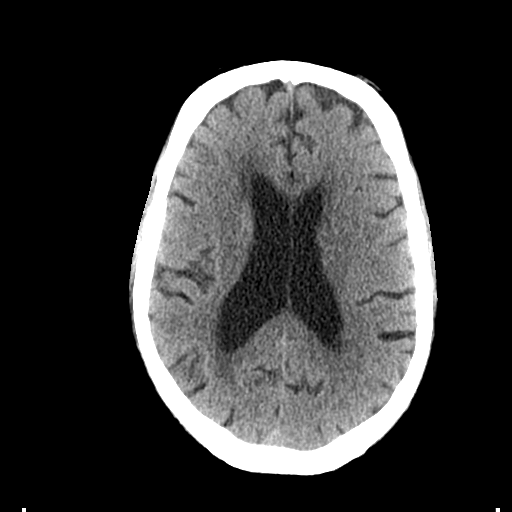
[im 20/31  brain]
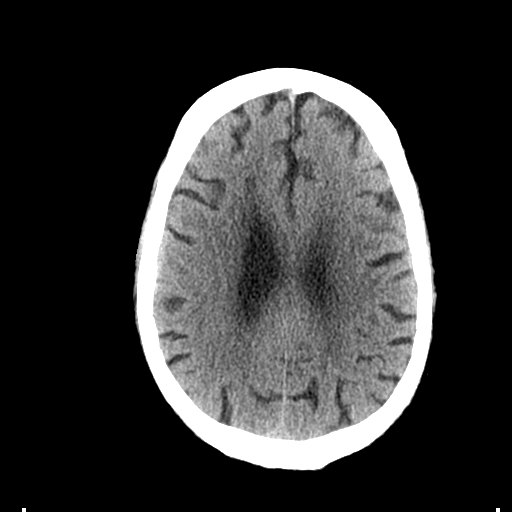
[im 20/31  bone]
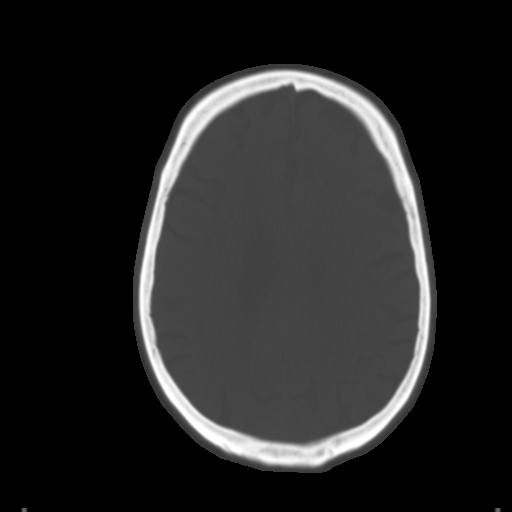
[im 22/31  brain]
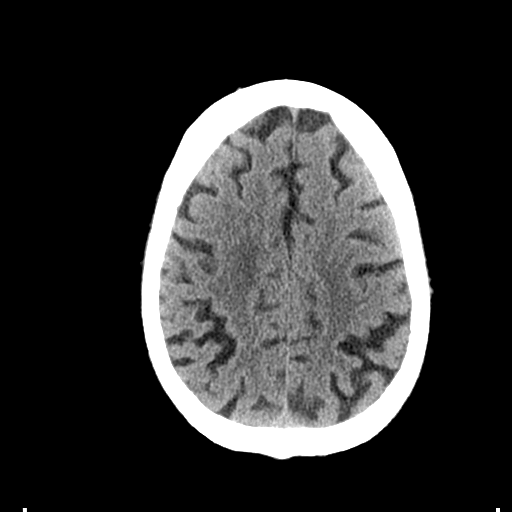
[im 24/31  brain]
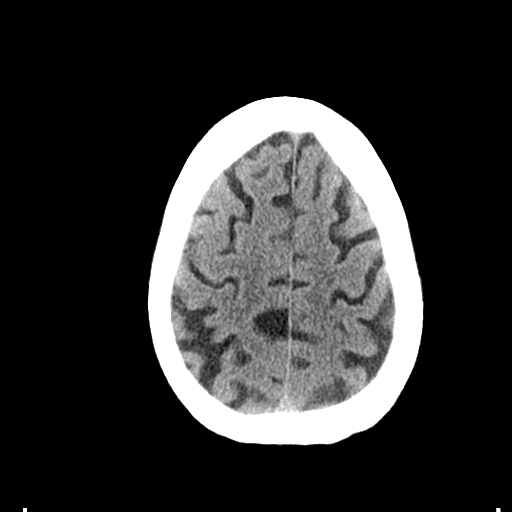
[im 26/31  brain]
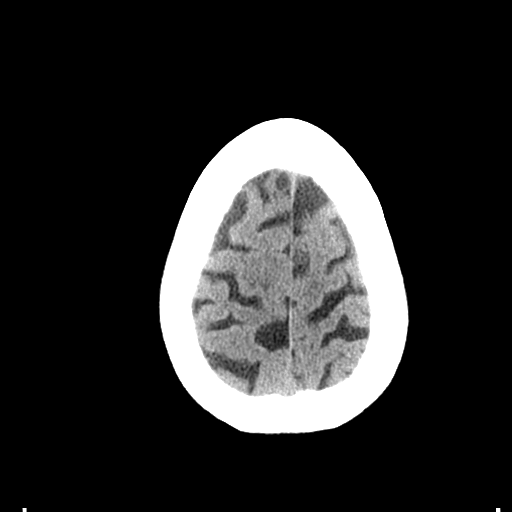
[im 28/31  brain]
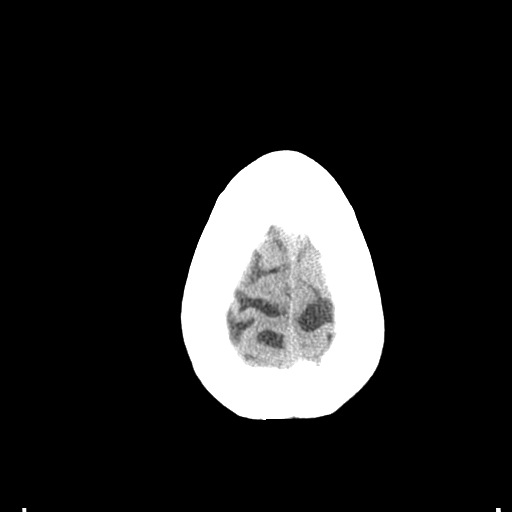
[im 28/31  bone]
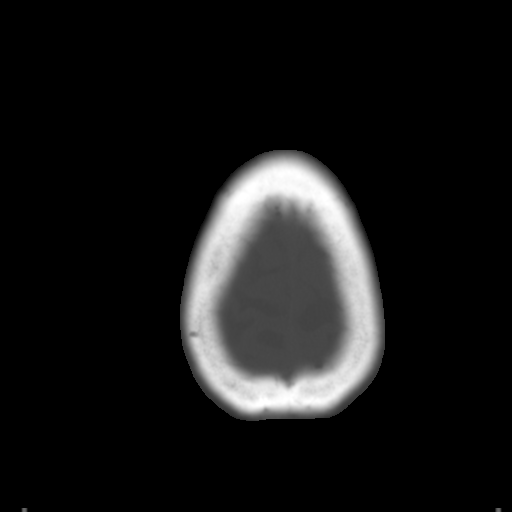

[Series 3: bone windows · axial · 0.47mm/px · z∈[-145,-125]mm · 2 of 31 slices shown]
[im 3/31  bone]
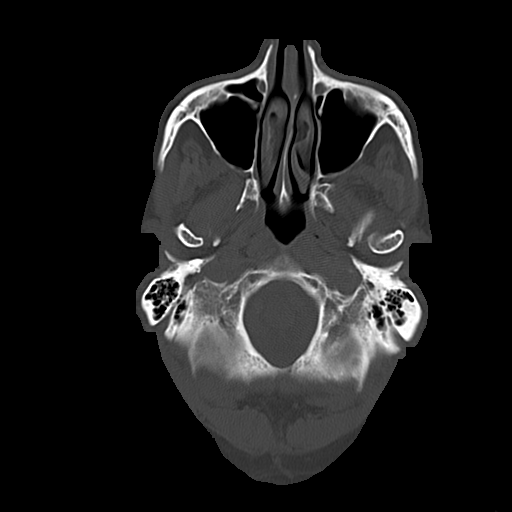
[im 7/31  bone]
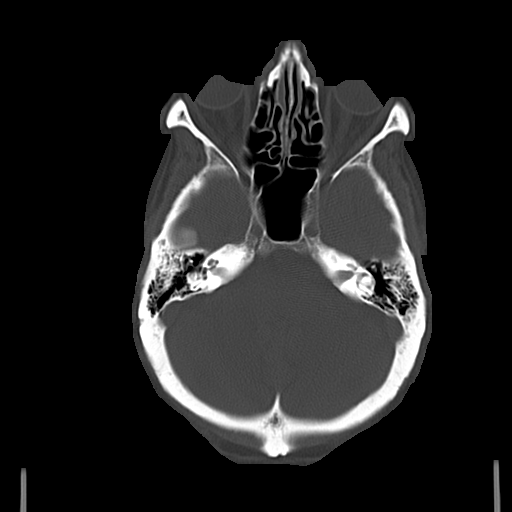

[15 of 30 positions shown; findings below may reference images not displayed]

FINDINGS: Skull and Sinuses:Negative for fracture or destructive process. The
mastoids, middle ears, and imaged paranasal sinuses are clear.

Orbits: No acute abnormality.

Brain: No evidence of acute gray matter infarction, hemorrhage,
hydrocephalus, or mass lesion/mass effect. There is generalized
brain atrophy with ventriculomegaly, mild. Chronic small-vessel
disease with patchy ischemic gliosis throughout the bilateral
cerebral white matter. There is ischemic change in the right
putamen, likely chronic based on history.
IMPRESSION: 1. No intracranial hemorrhage or definite infarct.
2. Moderate chronic small vessel disease, which could obscure a
recent white matter infarct.

## 2016-09-07 IMAGING — RF DG SWALLOWING FUNCTION - [facility]
1 series · 18 of 24 positions shown · non-contrast
Comparison: none

[Series 1: run · 18 acquisitions, 18 frames shown]
[im 1/18]
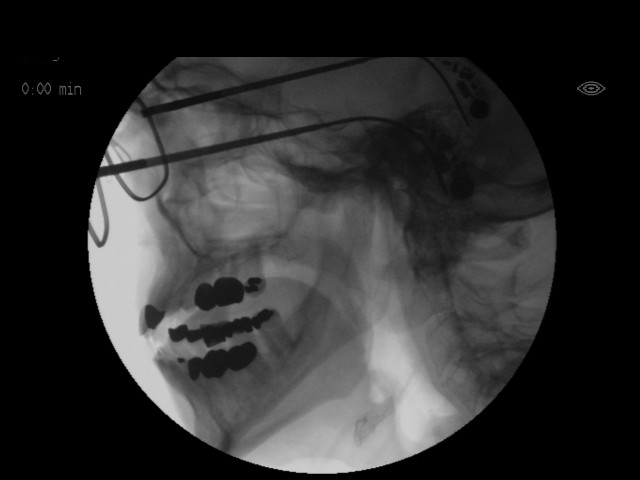
[im 2/18]
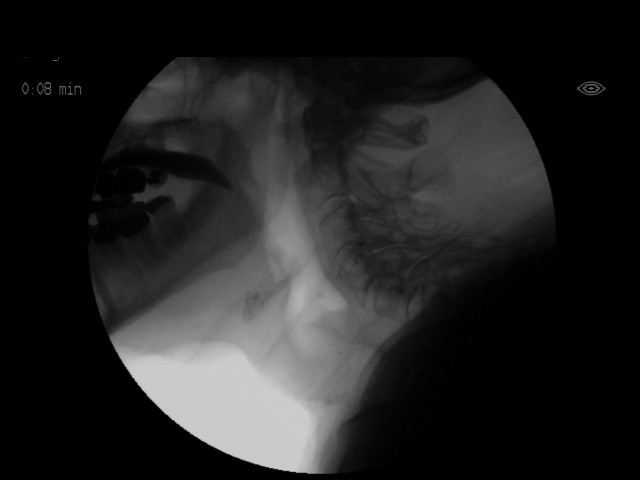
[im 3/18]
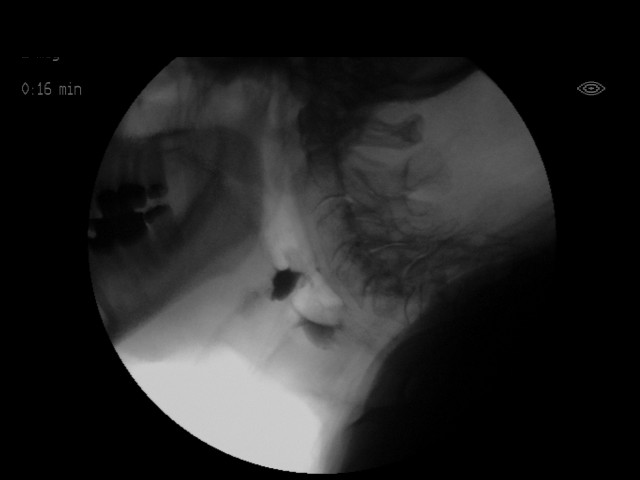
[im 4/18]
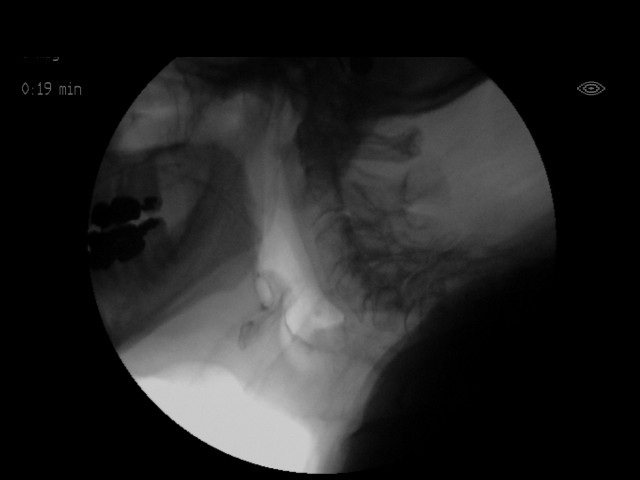
[im 5/18]
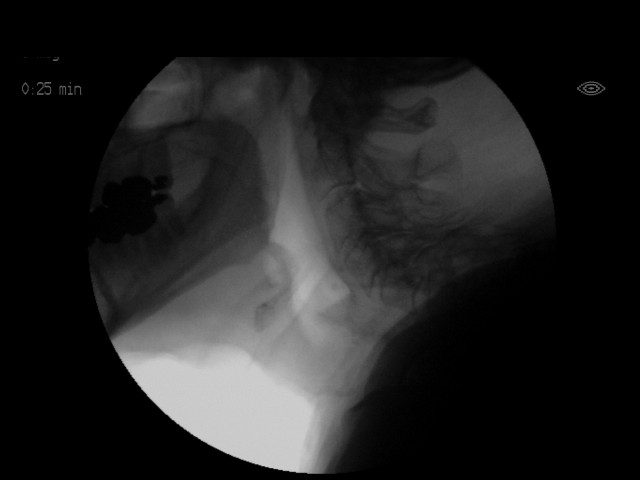
[im 6/18]
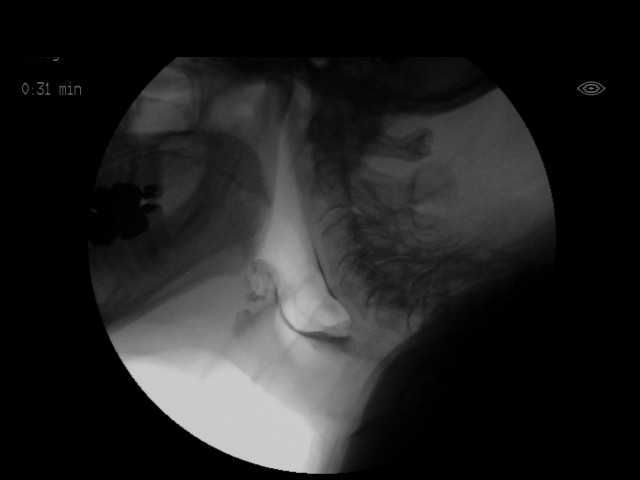
[im 7/18]
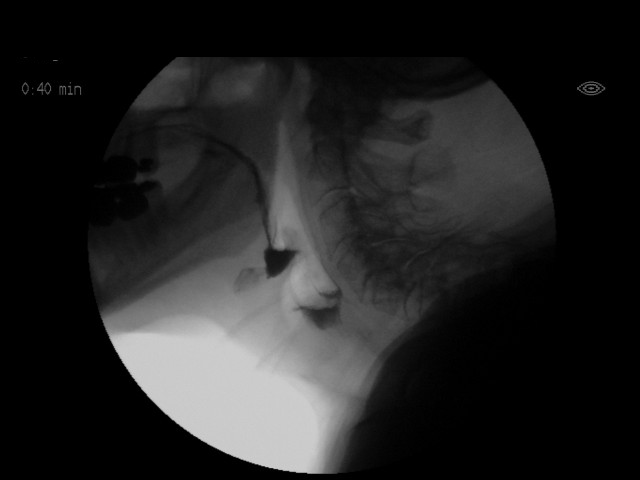
[im 8/18]
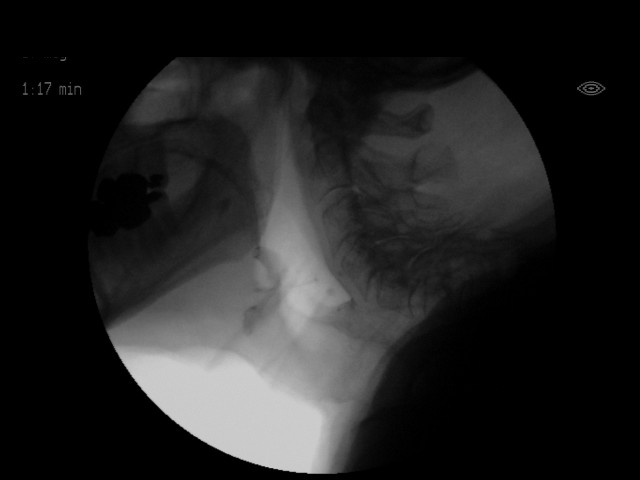
[im 9/18]
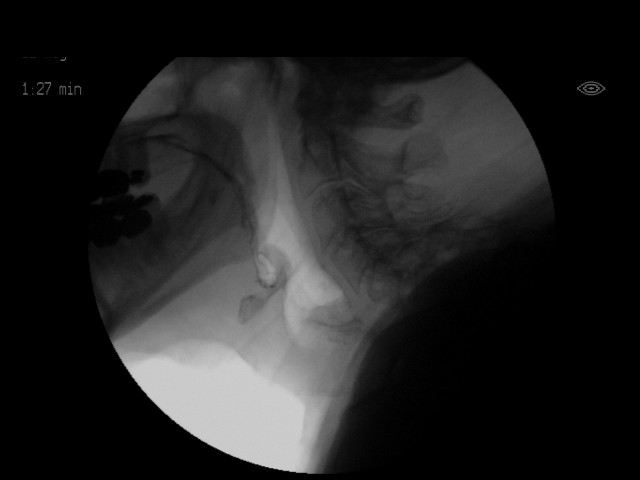
[im 10/18]
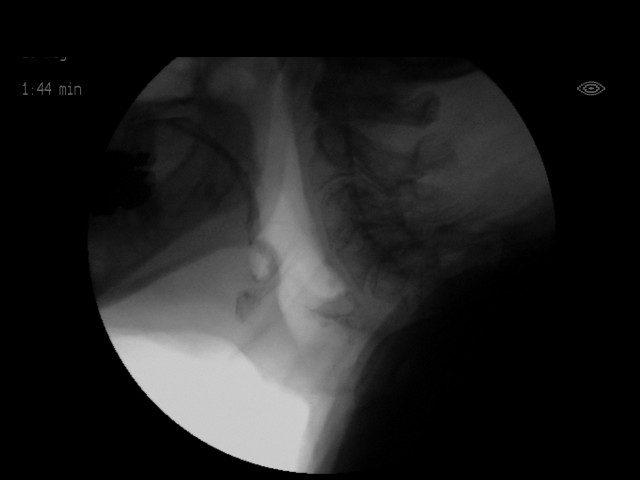
[im 11/18]
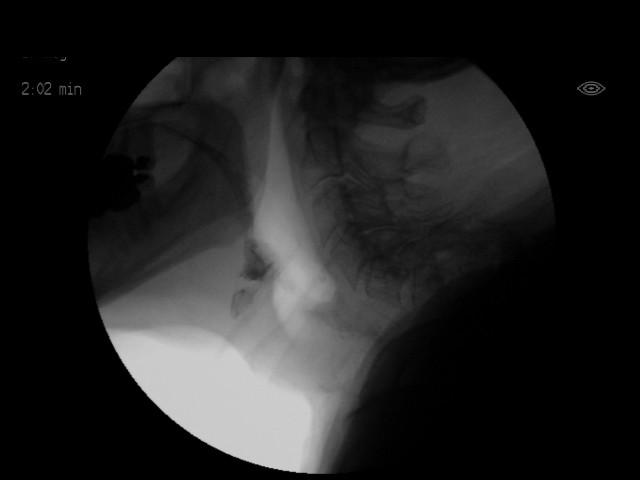
[im 12/18]
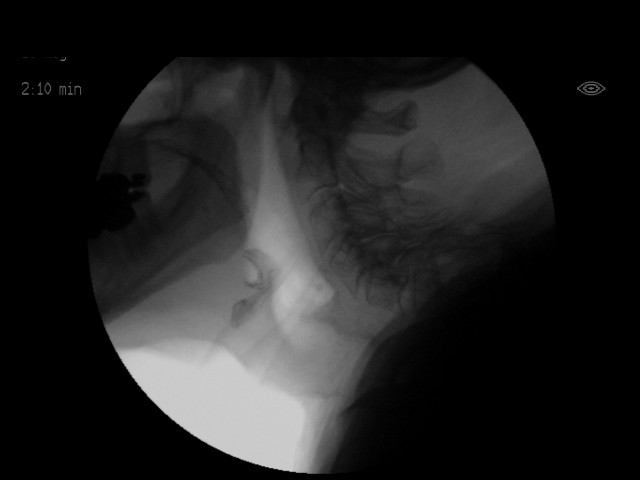
[im 13/18]
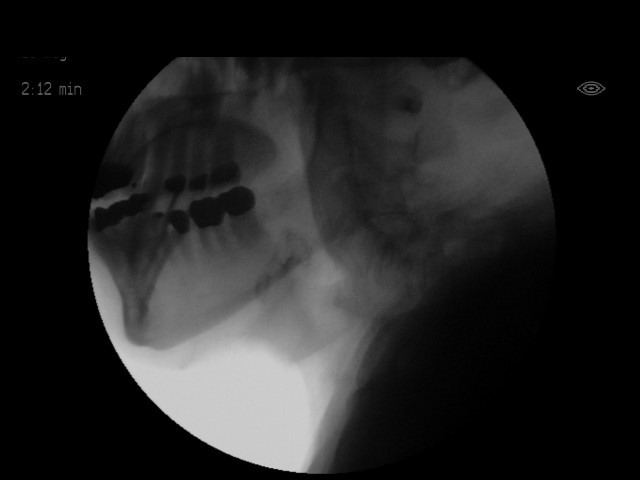
[im 15/18]
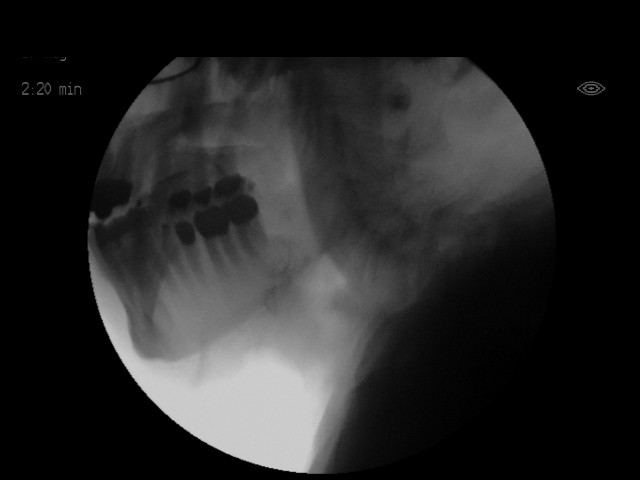
[im 15/18]
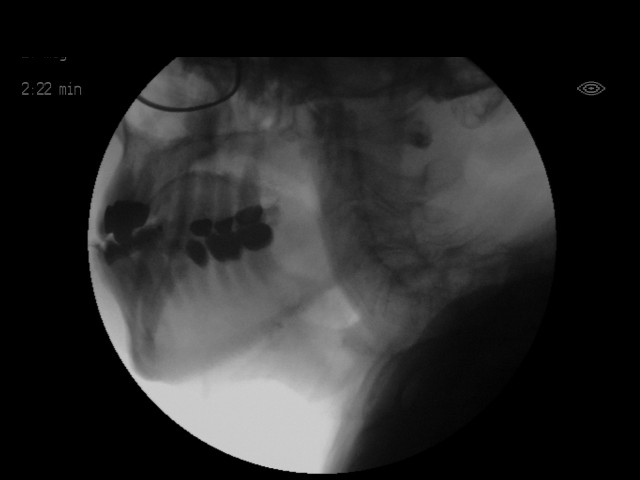
[im 16/18]
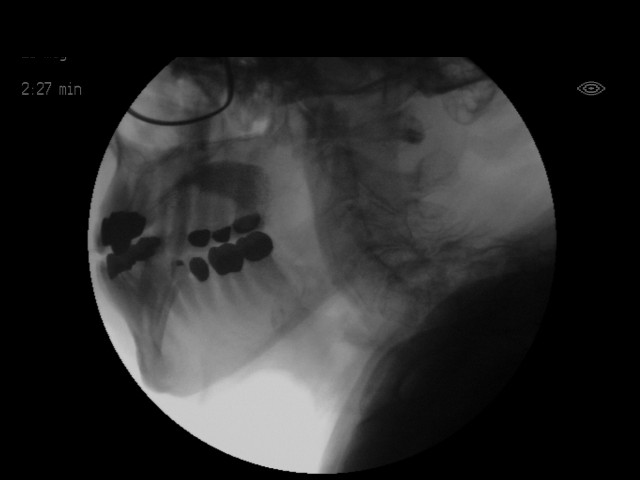
[im 18/18]
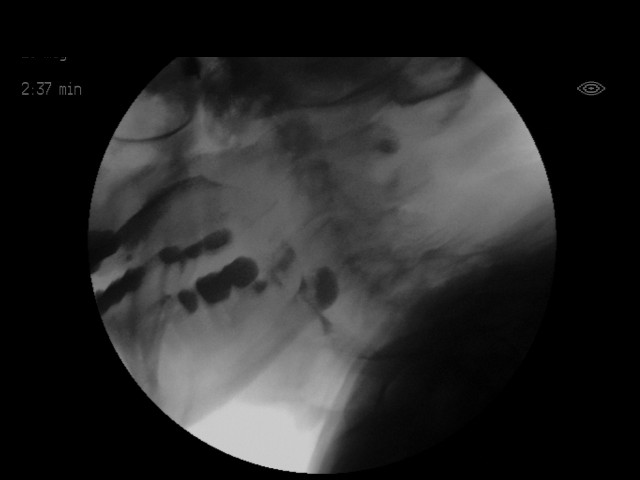
[im 18/18]
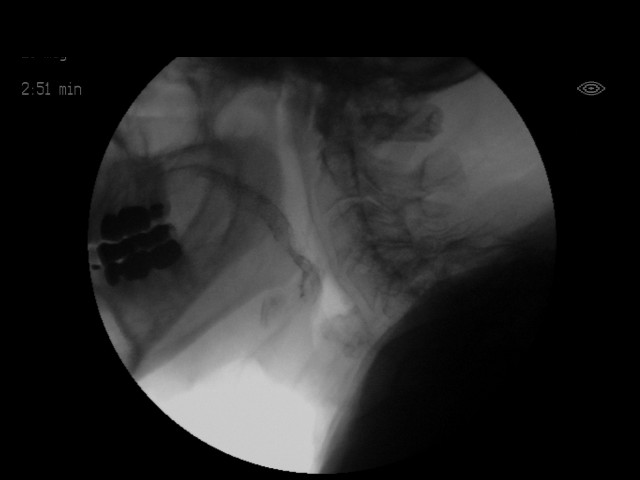

[18 of 24 positions shown; findings below may reference images not displayed]

Canned report from images found in remote index.

Refer to host system for actual result text.

## 2016-09-30 DIAGNOSIS — C439 Malignant melanoma of skin, unspecified: Secondary | ICD-10-CM | POA: Diagnosis not present

## 2016-09-30 DIAGNOSIS — K219 Gastro-esophageal reflux disease without esophagitis: Secondary | ICD-10-CM | POA: Diagnosis not present

## 2016-09-30 DIAGNOSIS — M6281 Muscle weakness (generalized): Secondary | ICD-10-CM | POA: Diagnosis not present

## 2016-09-30 DIAGNOSIS — E039 Hypothyroidism, unspecified: Secondary | ICD-10-CM | POA: Diagnosis not present

## 2016-09-30 DIAGNOSIS — H9190 Unspecified hearing loss, unspecified ear: Secondary | ICD-10-CM | POA: Diagnosis not present

## 2016-09-30 DIAGNOSIS — C9000 Multiple myeloma not having achieved remission: Secondary | ICD-10-CM | POA: Diagnosis not present

## 2016-09-30 DIAGNOSIS — I6340 Cerebral infarction due to embolism of unspecified cerebral artery: Secondary | ICD-10-CM | POA: Diagnosis not present

## 2016-10-10 DIAGNOSIS — H903 Sensorineural hearing loss, bilateral: Secondary | ICD-10-CM | POA: Diagnosis not present

## 2016-10-15 ENCOUNTER — Other Ambulatory Visit (HOSPITAL_BASED_OUTPATIENT_CLINIC_OR_DEPARTMENT_OTHER): Payer: Medicare Other

## 2016-10-15 DIAGNOSIS — C9000 Multiple myeloma not having achieved remission: Secondary | ICD-10-CM | POA: Diagnosis not present

## 2016-10-15 DIAGNOSIS — C419 Malignant neoplasm of bone and articular cartilage, unspecified: Secondary | ICD-10-CM | POA: Diagnosis not present

## 2016-10-15 LAB — CBC WITH DIFFERENTIAL/PLATELET
BASO%: 0.4 % (ref 0.0–2.0)
BASOS ABS: 0 10*3/uL (ref 0.0–0.1)
EOS ABS: 0.1 10*3/uL (ref 0.0–0.5)
EOS%: 2.3 % (ref 0.0–7.0)
HEMATOCRIT: 40.6 % (ref 38.4–49.9)
HEMOGLOBIN: 13.5 g/dL (ref 13.0–17.1)
LYMPH#: 0.8 10*3/uL — AB (ref 0.9–3.3)
LYMPH%: 17.9 % (ref 14.0–49.0)
MCH: 31.8 pg (ref 27.2–33.4)
MCHC: 33.2 g/dL (ref 32.0–36.0)
MCV: 95.8 fL (ref 79.3–98.0)
MONO#: 0.4 10*3/uL (ref 0.1–0.9)
MONO%: 7.9 % (ref 0.0–14.0)
NEUT#: 3.2 10*3/uL (ref 1.5–6.5)
NEUT%: 71.5 % (ref 39.0–75.0)
PLATELETS: 128 10*3/uL — AB (ref 140–400)
RBC: 4.24 10*6/uL (ref 4.20–5.82)
RDW: 14.6 % (ref 11.0–14.6)
WBC: 4.4 10*3/uL (ref 4.0–10.3)

## 2016-10-15 LAB — COMPREHENSIVE METABOLIC PANEL
ALT: 16 U/L (ref 0–55)
ANION GAP: 11 meq/L (ref 3–11)
AST: 20 U/L (ref 5–34)
Albumin: 4.1 g/dL (ref 3.5–5.0)
Alkaline Phosphatase: 62 U/L (ref 40–150)
BUN: 22.2 mg/dL (ref 7.0–26.0)
CHLORIDE: 103 meq/L (ref 98–109)
CO2: 29 meq/L (ref 22–29)
CREATININE: 1.1 mg/dL (ref 0.7–1.3)
Calcium: 9.7 mg/dL (ref 8.4–10.4)
EGFR: 62 mL/min/{1.73_m2} — ABNORMAL LOW (ref >90)
Glucose: 95 mg/dl (ref 70–140)
POTASSIUM: 4.4 meq/L (ref 3.5–5.1)
Sodium: 143 mEq/L (ref 136–145)
Total Bilirubin: 0.68 mg/dL (ref 0.20–1.20)
Total Protein: 6.5 g/dL (ref 6.4–8.3)

## 2016-10-15 LAB — LACTATE DEHYDROGENASE: LDH: 176 U/L (ref 125–245)

## 2016-10-16 LAB — IGG, IGA, IGM
IGA/IMMUNOGLOBULIN A, SERUM: 67 mg/dL (ref 61–437)
IGG (IMMUNOGLOBIN G), SERUM: 554 mg/dL — AB (ref 700–1600)
IgM, Qn, Serum: 37 mg/dL (ref 15–143)

## 2016-10-16 LAB — KAPPA/LAMBDA LIGHT CHAINS
Ig Kappa Free Light Chain: 10 mg/L (ref 3.3–19.4)
Ig Lambda Free Light Chain: 8.6 mg/L (ref 5.7–26.3)
Kappa/Lambda FluidC Ratio: 1.16 (ref 0.26–1.65)

## 2016-10-16 LAB — BETA 2 MICROGLOBULIN, SERUM: BETA 2: 2.7 mg/L — AB (ref 0.6–2.4)

## 2016-10-22 ENCOUNTER — Telehealth: Payer: Self-pay | Admitting: Internal Medicine

## 2016-10-22 ENCOUNTER — Encounter: Payer: Self-pay | Admitting: Internal Medicine

## 2016-10-22 ENCOUNTER — Ambulatory Visit (HOSPITAL_BASED_OUTPATIENT_CLINIC_OR_DEPARTMENT_OTHER): Payer: Medicare Other | Admitting: Internal Medicine

## 2016-10-22 VITALS — BP 124/67 | HR 86 | Temp 97.7°F | Resp 17 | Ht 69.0 in | Wt 155.8 lb

## 2016-10-22 DIAGNOSIS — C7951 Secondary malignant neoplasm of bone: Secondary | ICD-10-CM

## 2016-10-22 DIAGNOSIS — C9000 Multiple myeloma not having achieved remission: Secondary | ICD-10-CM | POA: Diagnosis not present

## 2016-10-22 DIAGNOSIS — D492 Neoplasm of unspecified behavior of bone, soft tissue, and skin: Secondary | ICD-10-CM

## 2016-10-22 NOTE — Progress Notes (Signed)
[place] Telephone:([phone]   Fax:(336) [place] [address] [address]  DIAGNOSIS: Multiple myeloma diagnosed in November 2015.  PRIOR THERAPY: 1) Status post Thoracic five laminectomy for epidural tumor resection, Thoracic 4-7 posterior lateral arthrodesis,  segmental pedicle screw fixation T4-T7 Globus instrumentation under the care of Dr. Christella Noa on 06/18/2014. 2) Systemic chemotherapy with Velcade 1.3 MG/M2 subcutaneously weekly in addition to Decadron 40 mg by mouth on weekly basis. Status post 11 cycles. 3) palliative radiotherapy to the right proximal humerus, shoulder and scapula under the care of Dr. Lisbeth Renshaw completed on 12/12/2014. 4)  Systemic chemotherapy with Velcade 1.3 MG/M2 subcutaneously weekly, Decadron 40 mg by mouth weekly in addition to Revlimid 25 MG by mouth daily for 21 days every 4 weeks, status post 14 cycles.   CURRENT THERAPY: Zometa 4 mg IV every 12 weeks. First dose was given 11/01/2014.  INTERVAL HISTORY: Jeremiah Owens 83 y.o. male returns to the clinic today for follow-up visit accompanied by his wife. The patient is feeling fine with no specific complaints. He denied having any weight loss or night sweats. He has no nausea, vomiting, diarrhea or constipation. He has no fever or chills. He denied having any chest pain, shortness of breath, cough or hemoptysis. He has repeat myeloma panel performed recently and he is here for evaluation and discussion of his lab results.  MEDICAL HISTORY: Past Medical History:  Diagnosis Date  . Allergy   . Anxiety   . Bone cancer ([place])    t_spine T-5  . Compression fracture   . Encounter for antineoplastic chemotherapy 01/31/2015  . Hearing loss   . Melanoma ([place])   . Multiple myeloma ([place])   . Prostate cancer ([place]) 2002  . S/P radiation therapy 08/21/14-09/04/14   T4-6 25Gy/74f  . S/P radiation therapy 11/29/14-12/12/14     rt prox humerus/shoulder/scapula 25Gy/177f . Skin cancer 1994   melanoma  right neck   . [place]  . Thyroid disease     ALLERGIES:  is allergic to iodine.  MEDICATIONS:  Current Outpatient Prescriptions  Medication Sig Dispense Refill  . aspirin 81 MG EC tablet Take 81 mg by mouth daily.  0  . Calcium Carbonate-Vitamin D (CALTRATE 600+D PO) Take 600 mg by mouth 2 (two) times daily.    . cholecalciferol (VITAMIN D) 1000 UNITS tablet Take 1,000 Units by mouth at bedtime.     . furosemide (LASIX) 20 MG tablet Take 20 mg by mouth daily. Pt takes 1 tablet daily except on Mon and Thurs takes 2 tablets    . KLOR-CON 10 10 MEQ tablet Take 10 mEq by mouth daily.    . Marland Kitchenevothyroxine (SYNTHROID, LEVOTHROID) 100 MCG tablet Take 100 mcg by mouth daily before breakfast.    . loperamide (IMODIUM) 1 MG/5ML solution Take 2 mg by mouth as needed for diarrhea or loose stools.    . Multiple Vitamins-Minerals (CENTRUM SILVER PO) Take 1 tablet by mouth daily.    . Marland Kitchenmeprazole (PRILOSEC) 40 MG capsule Take 40 mg by mouth daily as needed (heartburn).     . ranitidine (ZANTAC) 150 MG tablet Take 150 mg by mouth daily as needed for heartburn.   11  . tamsulosin (FLOMAX) 0.4 MG CAPS capsule Take 1 capsule (0.4 mg total) by mouth at bedtime. 30 capsule 3  . acyclovir (ZOVIRAX) 400 MG tablet Take 1 tablet (400 mg total) by  mouth 2 (two) times daily. (Patient not taking: Reported on 07/16/2016) 60 tablet 2  . diphenoxylate-atropine (LOMOTIL) 2.5-0.025 MG tablet Take 2 tablets by mouth 4 (four) times daily as needed for diarrhea or loose stools. (Patient not taking: Reported on 07/16/2016) 30 tablet 0  . methocarbamol (ROBAXIN) 500 MG tablet TAKE 1 TABLET (500 MG TOTAL) BY MOUTH EVERY 6 (SIX) HOURS AS NEEDED FOR MUSCLE SPASMS. (Patient not taking: Reported on 10/22/2016) 180 tablet 4  . sennosides-docusate sodium (SENOKOT-S) 8.6-50 MG tablet Take 1-2 tablets by mouth 2 (two) times daily as needed for constipation.  Reported on 01/23/2016    . warfarin (COUMADIN) 2 MG tablet Take 1 tablet (2 mg total) by mouth daily. 90 tablet 3   No current facility-administered medications for this visit.    Facility-Administered Medications Ordered in Other Visits  Medication Dose Route Frequency Provider Last Rate Last Dose  . sodium chloride 0.9 % injection 10 mL  10 mL Intracatheter PRN Curt Bears, MD        SURGICAL HISTORY:  Past Surgical History:  Procedure Laterality Date  . HERNIA REPAIR    . POSTERIOR CERVICAL FUSION/FORAMINOTOMY N/A 06/17/2014   Procedure: Thoracic five laminectomy for epidural tumor resection Thoracic 4-7 posterior lateral arthrodesis, segmental pedicle screw fixation.;  Surgeon: Ashok Pall, MD;  Location: [facility];  Service: Neurosurgery;  Laterality: N/A;    REVIEW OF SYSTEMS:  A comprehensive review of systems was negative except for: Musculoskeletal: positive for muscle weakness   PHYSICAL EXAMINATION: General appearance: alert, cooperative and no distress Head: Normocephalic, without obvious abnormality, atraumatic Neck: no adenopathy, no JVD, supple, symmetrical, trachea midline and thyroid not enlarged, symmetric, no tenderness/mass/nodules Lymph nodes: Cervical, supraclavicular, and axillary nodes normal. Resp: clear to auscultation bilaterally Back: symmetric, no curvature. ROM normal. No CVA tenderness. Cardio: regular rate and rhythm, S1, S2 normal, no murmur, click, rub or gallop GI: soft, non-tender; bowel sounds normal; no masses,  no organomegaly Extremities: extremities normal, atraumatic, no cyanosis or edema  ECOG PERFORMANCE STATUS: 1 - Symptomatic but completely ambulatory  Blood pressure 124/67, pulse 86, temperature 97.7 F (36.5 C), temperature source Oral, resp. rate 17, height '5\' 9"'  (1.753 m), weight 155 lb 12.8 oz (70.7 kg), SpO2 99 %.  LABORATORY DATA: Lab Results  Component Value Date   WBC 4.4 10/15/2016   HGB 13.5 10/15/2016   HCT 40.6  10/15/2016   MCV 95.8 10/15/2016   PLT 128 (L) 10/15/2016      Chemistry      Component Value Date/Time   NA 143 10/15/2016 1009   K 4.4 10/15/2016 1009   CL 101 06/18/2016 1140   CO2 29 10/15/2016 1009   BUN 22.2 10/15/2016 1009   CREATININE 1.1 10/15/2016 1009      Component Value Date/Time   CALCIUM 9.7 10/15/2016 1009   ALKPHOS 62 10/15/2016 1009   AST 20 10/15/2016 1009   ALT 16 10/15/2016 1009   BILITOT 0.68 10/15/2016 1009       RADIOGRAPHIC STUDIES: No results found.  ASSESSMENT AND PLAN:  This is a very pleasant 83 years old white male with history of multiple myeloma diagnosed in November 2015 status post several chemotherapy regimens as well as radiation. The patient is currently on observation. His recent myeloma panel showed no evidence for disease progression. I discussed the lab result with the patient and his wife today. I recommended for him to continue on observation with repeat myeloma panel in 3 months. For the myeloma  bone disease, he will continue on treatment with Zometa every 3 months. He was advised to call immediately if he has any concerning symptoms in the interval. The patient voices understanding of current disease status and treatment options and is in agreement with the current care plan.  All questions were answered. The patient knows to call the clinic with any problems, questions or concerns. We can certainly see the patient much sooner if necessary. I spent 10 minutes counseling the patient face to face. The total time spent in the appointment was 15 minutes.   Disclaimer: This note was dictated with voice recognition software. Similar sounding words can inadvertently be transcribed and may not be corrected upon review.

## 2016-10-22 NOTE — Telephone Encounter (Signed)
Labs, follow up and Zometa was scheduled every 3 months, throughout December, per 10/22/16 los. Zometa added for Friday 10/23/16, per los. Patient was given a copy of the AVS report and appointment schedule, per 10/22/16 los.

## 2016-10-24 ENCOUNTER — Ambulatory Visit (HOSPITAL_BASED_OUTPATIENT_CLINIC_OR_DEPARTMENT_OTHER): Payer: Medicare Other

## 2016-10-24 VITALS — BP 126/63 | HR 75 | Temp 98.6°F | Resp 20

## 2016-10-24 DIAGNOSIS — C9000 Multiple myeloma not having achieved remission: Secondary | ICD-10-CM

## 2016-10-24 DIAGNOSIS — C7951 Secondary malignant neoplasm of bone: Secondary | ICD-10-CM

## 2016-10-24 MED ORDER — SODIUM CHLORIDE 0.9 % IV SOLN
Freq: Once | INTRAVENOUS | Status: AC
  Administered 2016-10-24: 15:00:00 via INTRAVENOUS

## 2016-10-24 MED ORDER — ZOLEDRONIC ACID 4 MG/100ML IV SOLN
4.0000 mg | Freq: Once | INTRAVENOUS | Status: AC
  Administered 2016-10-24: 4 mg via INTRAVENOUS
  Filled 2016-10-24: qty 100

## 2016-10-24 NOTE — Patient Instructions (Signed)

## 2016-10-28 ENCOUNTER — Ambulatory Visit (INDEPENDENT_AMBULATORY_CARE_PROVIDER_SITE_OTHER): Payer: Medicare Other | Admitting: Neurology

## 2016-10-28 ENCOUNTER — Encounter: Payer: Self-pay | Admitting: Neurology

## 2016-10-28 VITALS — BP 117/67 | HR 87 | Ht 69.0 in | Wt 155.6 lb

## 2016-10-28 DIAGNOSIS — G3184 Mild cognitive impairment, so stated: Secondary | ICD-10-CM | POA: Diagnosis not present

## 2016-10-28 NOTE — Patient Instructions (Signed)
I had a long d/w patient and his wife  about his remote lacunar stroke, risk for recurrent stroke/TIAs, personally independently reviewed imaging studies and stroke evaluation results and answered questions.Continue aspirin 81 mg daily  for secondary stroke prevention and maintain strict control of hypertension with blood pressure goal below 130/90, diabetes with hemoglobin A1c goal below 6.5% and lipids with LDL cholesterol goal below 70 mg/dL. I also advised the patient to  to use his cane at all times and fall and safety precautions. I also advised him to participate in mentally challengingactivities like solving crossword puzzles, playing bridge, word search and sudoku to help with his mild cognitive impairment.  Trial of nutritional supplements like Prevagen or Reservetrol to help with his memory. We also discussed memory compensation strategies. He will return for follow-up in the future only as necessary  Memory Compensation Strategies  1. Use "WARM" strategy.  W= write it down  A= associate it  R= repeat it  M= make a mental note  2.   You can keep a Social worker.  Use a 3-ring notebook with sections for the following: calendar, important names and phone numbers,  medications, doctors' names/phone numbers, lists/reminders, and a section to journal what you did  each day.   3.    Use a calendar to write appointments down.  4.    Write yourself a schedule for the day.  This can be placed on the calendar or in a separate section of the Memory Notebook.  Keeping a  regular schedule can help memory.  5.    Use medication organizer with sections for each day or morning/evening pills.  You may need help loading it  6.    Keep a basket, or pegboard by the door.  Place items that you need to take out with you in the basket or on the pegboard.  You may also want to  include a message board for reminders.  7.    Use sticky notes.  Place sticky notes with reminders in a place where the task  is performed.  For example: " turn off the  stove" placed by the stove, "lock the door" placed on the door at eye level, " take your medications" on  the bathroom mirror or by the place where you normally take your medications.  8.    Use alarms/timers.  Use while cooking to remind yourself to check on food or as a reminder to take your medicine, or as a  reminder to make a call, or as a reminder to perform another task, etc.

## 2016-10-28 NOTE — Progress Notes (Signed)
[facility] [address] [place]. [place] [address] [phone]       OFFICE FOLLOW-UP NOTE  [name] Date of Birth:  Jun 19, 1930 Medical Record Number:  [id number]   HPI: Jeremiah Owens is seen today for first office follow-up visit following hospital admission for stroke in July 2016. Jeremiah Owens is an 83 y.o. male with a history of multiple myeloma treated with Revlamid who presented with 2 days of right leg weakness and difficulty speaking. He states that this started quite abruptly. Prior to this, he did not have any problems with his speech either. He states that he had difficulty with finding his words, and indeed it was difficult getting history from him because of some word finding difficulty.LKW: 7/8 tpa given?: no, out of window CT scan head on admission showed no acute abnormality and only moderate changes of chronic small vessel disease. MRI scan of the brain showed an acute 89 9 mm left basal ganglia lacunar infarct and old right caudate lacunar infarct. MRA brain showed no large vessel occlusion or stenosis. Transthoracic echo showed ejection fraction of 55-60% without wall motion abnormalities. Carotid ultrasound showed no significant extracranial stenosis or occlusion. HDL cholesterol was 76 mg percent total cholesterol 165. Hb A1c was 5.7. Patient states is still slightly weak and left-sided pedicle his leg. He is currently getting outpatient physical and occupational therapy and has made good recovery. His able to walk with a wheeled walker for clot or balance and uses a cane Dose. Patient is currently having ongoing chemotherapy for his myeloma and tolerating it well. The patient complains of significant skin bruising as he is on both aspirin 81 as well as warfarin.. I am not clear as to why he is on warfarin. He states his speech difficulties from his stroke symptoms have improved significantly though he has developed some trouble walking and dragging  his left leg and this may be a chronic problem not related to the present stroke Update 10/30/2015 : He returns for follow-up after last visit 6 months ago. Accompanied by his wife. He states he has had 2 falls. The first for more than a shower when he was reaching out further to our clinic on the floor. The second fall occurred a week ago when he was hurting and he fell over and hit his elbow and sustained a bruise. He was seen in urgent care. Patient remains on low-dose warfarin 2 mg daily for 6 doses without and monitoring for hypercoagulability from myeloma. He does not have atrial fibrillation. He is also on aspirin. He has had no recurrent stroke or TIA symptoms. He states his blood pressure is usually in the 120s though it is slightly low today at [id number] in office. His multiple myeloma seems quite stable he has been on Revlumid for a year and now is taking a break for 3 months as per his oncologist. He uses a wheeled walker when walking outdoors in his usually careful. He has no other complaints Update 10/28/2016 : He returns for follow-up after last visit a year ago. He is accompanied by his wife. He is doing well from neurovascular standpoint without recurrent stroke or TIA symptoms. He has discontinued warfarin a few weeks ago and switched to aspirin. He was on warfarin due to hypercarbia team from Revlimid for chemotherapy and he denies history of atrial fibrillation. Patient does use a cane but mostly for long-distance walking. He feels quite comfortable walking indoors without it. He is had no recent falls  or injuries. His tolerate aspirin well with only minor bruising and no bleeding. His cannot to me when he had his last lipid profile checked by his primary physician Dr. Deforest Hoyles but he plans to see him in the summer. He is concerned about his short-term memory getting poor. He gets frustrated and doesn't remember recent information. At times he can remove his mother it later. His able to complete  sentences and does not stop in mid sentence. ROS:   14 system review of systems is positive for   hearing loss, ringing in the ears, constipation, diarrhea, leg swelling, restless leg, daytime sleepiness, frequency of urination, walking difficulty, memory loss and all other systems negative PMH:  Past Medical History:  Diagnosis Date  . Allergy   . Anxiety   . Bone cancer ([place])    t_spine T-5  . Compression fracture   . Encounter for antineoplastic chemotherapy 01/31/2015  . Hearing loss   . Melanoma ([place])   . Multiple myeloma ([place])   . Prostate cancer ([place]) 2002  . S/P radiation therapy 08/21/14-09/04/14   T4-6 25Gy/7f  . S/P radiation therapy 11/29/14-12/12/14   rt prox humerus/shoulder/scapula 25Gy/138f . Skin cancer 1994   melanoma  right neck   . [place]  . Thyroid disease     Social History:  Social History   Social History  . Marital status: Married    Spouse name: N/A  . Number of children: N/A  . Years of education: N/A   Occupational History  . Not on file.   Social History Main Topics  . Smoking status: Never Smoker  . Smokeless tobacco: Never Used  . Alcohol use No  . Drug use: No  . Sexual activity: Not on file   Other Topics Concern  . Not on file   Social History Narrative  . No narrative on file    Medications:   Current Outpatient Prescriptions on File Prior to Visit  Medication Sig Dispense Refill  . aspirin 81 MG EC tablet Take 81 mg by mouth daily.  0  . Calcium Carbonate-Vitamin D (CALTRATE 600+D PO) Take 600 mg by mouth 2 (two) times daily.    . cholecalciferol (VITAMIN D) 1000 UNITS tablet Take 1,000 Units by mouth at bedtime.     . diphenoxylate-atropine (LOMOTIL) 2.5-0.025 MG tablet Take 2 tablets by mouth 4 (four) times daily as needed for diarrhea or loose stools. 30 tablet 0  . furosemide (LASIX) 20 MG tablet Take 20 mg by mouth daily. Pt takes 1 tablet daily except on Mon and Thurs takes 2 tablets    . KLOR-CON 10 10 MEQ  tablet Take 10 mEq by mouth daily.    . Marland Kitchenevothyroxine (SYNTHROID, LEVOTHROID) 100 MCG tablet Take 100 mcg by mouth daily before breakfast.    . methocarbamol (ROBAXIN) 500 MG tablet TAKE 1 TABLET (500 MG TOTAL) BY MOUTH EVERY 6 (SIX) HOURS AS NEEDED FOR MUSCLE SPASMS. 180 tablet 4  . Multiple Vitamins-Minerals (CENTRUM SILVER PO) Take 1 tablet by mouth daily.    . Marland Kitchenmeprazole (PRILOSEC) 40 MG capsule Take 40 mg by mouth daily as needed (heartburn).     . ranitidine (ZANTAC) 150 MG tablet Take 150 mg by mouth daily as needed for heartburn.   11  . sennosides-docusate sodium (SENOKOT-S) 8.6-50 MG tablet Take 1-2 tablets by mouth 2 (two) times daily as needed for constipation. Reported on 01/23/2016    . tamsulosin (FLOMAX) 0.4 MG CAPS capsule Take 1 capsule (0.4  mg total) by mouth at bedtime. 30 capsule 3  . warfarin (COUMADIN) 2 MG tablet Take 1 tablet (2 mg total) by mouth daily. (Patient not taking: Reported on 10/28/2016) 90 tablet 3   Current Facility-Administered Medications on File Prior to Visit  Medication Dose Route Frequency Provider Last Rate Last Dose  . sodium chloride 0.9 % injection 10 mL  10 mL Intracatheter PRN Curt Bears, MD        Allergies:   Allergies  Allergen Reactions  . Iodine Itching    Patient had a reaction 20 years ago and can't remember what kind of reaction he had.  Patient was given decadron in the ER and had an Angio ordered and did test with no problems.  Bottom line "patient needs to pre-medicated before any IV dye is given".    Physical Exam General: well developed, well nourished, seated, in no evident distress Head: head normocephalic and atraumatic.  Neck: supple with no carotid or supraclavicular bruits Cardiovascular: regular rate and rhythm, no murmurs Musculoskeletal: no deformity mild kyphosis Skin:  no rash/but scattered petechiae over her forearms and legs Vascular:  Normal pulses all extremities Vitals:   10/28/16 1044  BP: 117/67    Pulse: 87   Neurologic Exam Mental Status: Awake and fully alert. Oriented to place and time. Recent and remote memory intact. Attention span, concentration and fund of knowledge appropriate. Mood and affect appropriate. Diminished recall 1/3. Able to name 8 animals with 4 legs. Clock drawing 4/4. Cranial Nerves: Fundoscopic exam not done   Pupils equal, briskly reactive to light. Extraocular movements full without nystagmus. Visual fields full to confrontation. Hearing mildly diminished bilaterally despite hearing aids.. Facial sensation intact. Face, tongue, palate moves normally and symmetrically.  Motor: Normal bulk and tone. Normal strength in all tested extremity muscles. Except mild weakness of left grip, hip flexors and ankle dorsiflexors. Diminished fine finger movements on the left. Orbits right over left upper extremity. Sensory.: intact to touch ,pinprick .position and vibratory sensation.  Coordination: Rapid alternating movements normal in all extremities. Finger-to-nose  performed accurately bilaterally but impaired. heel-to-shin on the left Gait and Station: Arises from chair with slight difficulty. Stance is stooped. Gait demonstrates dragging of left leg and spasticity .  Unsteady on left foot unsupported. Balance poor.  Reflexes: 1+ and symmetric. Toes downgoing.       ASSESSMENT: 83 year  male with leftt basal ganglia infarct in July 2016 due to small vessel disease with vascular risk factors of  , hypertension and hypercoagulability from myeloma with chemotherapy. New complains of memory loss likely due to age-related mild cognitive impairment.    PLAN: I had a long d/w patient and his wife  about his remote lacunar stroke, risk for recurrent stroke/TIAs, personally independently reviewed imaging studies and stroke evaluation results and answered questions.Continue aspirin 81 mg daily  for secondary stroke prevention and maintain strict control of hypertension with blood  pressure goal below 130/90, diabetes with hemoglobin A1c goal below 6.5% and lipids with LDL cholesterol goal below 70 mg/dL. I also advised the patient to  to use his cane at all times and fall and safety precautions. I also advised him to participate in mentally challengingactivities like solving crossword puzzles, playing bridge, word search and sudoku to help with his mild cognitive impairment.  Trial of nutritional supplements like Prevagen or Reservetrol to help with his memory. We also discussed memory compensation strategies. He will return for follow-up in the future only as necessary Greater  than 50% time during this 25 minute visit was spent on counseling and coordination of care. Antony Contras, MD  Note: This document was prepared with digital dictation and possible smart phrase technology. Any transcriptional errors that result from this process are unintentional

## 2017-01-14 ENCOUNTER — Other Ambulatory Visit (HOSPITAL_BASED_OUTPATIENT_CLINIC_OR_DEPARTMENT_OTHER): Payer: Medicare Other

## 2017-01-14 DIAGNOSIS — C7951 Secondary malignant neoplasm of bone: Secondary | ICD-10-CM

## 2017-01-14 DIAGNOSIS — C9000 Multiple myeloma not having achieved remission: Secondary | ICD-10-CM

## 2017-01-14 DIAGNOSIS — D492 Neoplasm of unspecified behavior of bone, soft tissue, and skin: Secondary | ICD-10-CM

## 2017-01-14 LAB — CBC WITH DIFFERENTIAL/PLATELET
BASO%: 0.2 % (ref 0.0–2.0)
Basophils Absolute: 0 10*3/uL (ref 0.0–0.1)
EOS%: 1.7 % (ref 0.0–7.0)
Eosinophils Absolute: 0.1 10*3/uL (ref 0.0–0.5)
HEMATOCRIT: 39.3 % (ref 38.4–49.9)
HEMOGLOBIN: 13.1 g/dL (ref 13.0–17.1)
LYMPH#: 0.9 10*3/uL (ref 0.9–3.3)
LYMPH%: 19.6 % (ref 14.0–49.0)
MCH: 32.3 pg (ref 27.2–33.4)
MCHC: 33.3 g/dL (ref 32.0–36.0)
MCV: 96.8 fL (ref 79.3–98.0)
MONO#: 0.4 10*3/uL (ref 0.1–0.9)
MONO%: 8.7 % (ref 0.0–14.0)
NEUT%: 69.8 % (ref 39.0–75.0)
NEUTROS ABS: 3.3 10*3/uL (ref 1.5–6.5)
PLATELETS: 119 10*3/uL — AB (ref 140–400)
RBC: 4.06 10*6/uL — ABNORMAL LOW (ref 4.20–5.82)
RDW: 14.2 % (ref 11.0–14.6)
WBC: 4.7 10*3/uL (ref 4.0–10.3)

## 2017-01-14 LAB — COMPREHENSIVE METABOLIC PANEL
ALT: 15 U/L (ref 0–55)
ANION GAP: 8 meq/L (ref 3–11)
AST: 19 U/L (ref 5–34)
Albumin: 4 g/dL (ref 3.5–5.0)
Alkaline Phosphatase: 54 U/L (ref 40–150)
BILIRUBIN TOTAL: 0.63 mg/dL (ref 0.20–1.20)
BUN: 25.4 mg/dL (ref 7.0–26.0)
CALCIUM: 9.7 mg/dL (ref 8.4–10.4)
CO2: 31 meq/L — AB (ref 22–29)
CREATININE: 1.1 mg/dL (ref 0.7–1.3)
Chloride: 103 mEq/L (ref 98–109)
EGFR: 59 mL/min/{1.73_m2} — ABNORMAL LOW (ref >90)
Glucose: 73 mg/dl (ref 70–140)
Potassium: 4.2 mEq/L (ref 3.5–5.1)
Sodium: 141 mEq/L (ref 136–145)
TOTAL PROTEIN: 6.2 g/dL — AB (ref 6.4–8.3)

## 2017-01-14 LAB — LACTATE DEHYDROGENASE: LDH: 164 U/L (ref 125–245)

## 2017-01-15 LAB — KAPPA/LAMBDA LIGHT CHAINS
IG KAPPA FREE LIGHT CHAIN: 10.6 mg/L (ref 3.3–19.4)
Ig Lambda Free Light Chain: 8.7 mg/L (ref 5.7–26.3)
Kappa/Lambda FluidC Ratio: 1.22 (ref 0.26–1.65)

## 2017-01-15 LAB — BETA 2 MICROGLOBULIN, SERUM: Beta-2: 2.7 mg/L — ABNORMAL HIGH (ref 0.6–2.4)

## 2017-01-15 LAB — IGG, IGA, IGM
IGA/IMMUNOGLOBULIN A, SERUM: 63 mg/dL (ref 61–437)
IGG (IMMUNOGLOBIN G), SERUM: 553 mg/dL — AB (ref 700–1600)
IgM, Qn, Serum: 33 mg/dL (ref 15–143)

## 2017-01-21 ENCOUNTER — Ambulatory Visit (HOSPITAL_BASED_OUTPATIENT_CLINIC_OR_DEPARTMENT_OTHER): Payer: Medicare Other

## 2017-01-21 ENCOUNTER — Ambulatory Visit (HOSPITAL_BASED_OUTPATIENT_CLINIC_OR_DEPARTMENT_OTHER): Payer: Medicare Other | Admitting: Internal Medicine

## 2017-01-21 ENCOUNTER — Encounter: Payer: Self-pay | Admitting: Internal Medicine

## 2017-01-21 ENCOUNTER — Telehealth: Payer: Self-pay | Admitting: Internal Medicine

## 2017-01-21 VITALS — BP 129/71 | HR 76 | Temp 98.6°F | Resp 20 | Ht 69.0 in | Wt 152.4 lb

## 2017-01-21 DIAGNOSIS — C7951 Secondary malignant neoplasm of bone: Secondary | ICD-10-CM

## 2017-01-21 DIAGNOSIS — C9000 Multiple myeloma not having achieved remission: Secondary | ICD-10-CM | POA: Diagnosis not present

## 2017-01-21 DIAGNOSIS — C419 Malignant neoplasm of bone and articular cartilage, unspecified: Secondary | ICD-10-CM

## 2017-01-21 MED ORDER — ZOLEDRONIC ACID 4 MG/100ML IV SOLN
4.0000 mg | Freq: Once | INTRAVENOUS | Status: AC
  Administered 2017-01-21: 4 mg via INTRAVENOUS
  Filled 2017-01-21: qty 100

## 2017-01-21 MED ORDER — SODIUM CHLORIDE 0.9 % IV SOLN
INTRAVENOUS | Status: DC
  Administered 2017-01-21: 11:00:00 via INTRAVENOUS

## 2017-01-21 NOTE — Progress Notes (Signed)
[place] Telephone:([phone]) [phone]   Fax:([phone]  OFFICE PROGRESS NOTE  Wenda Low, MD [address] [place] [address]  DIAGNOSIS: Multiple myeloma diagnosed in November 2015.  PRIOR THERAPY: 1) Status post Thoracic five laminectomy for epidural tumor resection, Thoracic 4-7 posterior lateral arthrodesis,  segmental pedicle screw fixation T4-T7 Globus instrumentation under the care of Dr. Christella Noa on 06/18/2014. 2) Systemic chemotherapy with Velcade 1.3 MG/M2 subcutaneously weekly in addition to Decadron 40 mg by mouth on weekly basis. Status post 11 cycles. 3) palliative radiotherapy to the right proximal humerus, shoulder and scapula under the care of Dr. Lisbeth Renshaw completed on 12/12/2014. 4)  Systemic chemotherapy with Velcade 1.3 MG/M2 subcutaneously weekly, Decadron 40 mg by mouth weekly in addition to Revlimid 25 MG by mouth daily for 21 days every 4 weeks, status post 14 cycles.   CURRENT THERAPY: Zometa 4 mg IV every 12 weeks. First dose was given 11/01/2014.  INTERVAL HISTORY: Jeremiah Owens 83 y.o. male returns to the clinic today for follow-up visit accompanied by his wife. The patient is feeling fine today with no specific complaints except for generalized weakness and some gait imbalance. He is currently undergoing physical therapy and balance classes with mild improvement. He denied having any recent weight loss or night sweats. He has no nausea, vomiting, diarrhea or constipation. He has no chest pain, shortness of breath, cough or hemoptysis. He had repeat myeloma panel performed recently and he is here for evaluation and discussion of his lab results and treatment options.  MEDICAL HISTORY: Past Medical History:  Diagnosis Date  . Allergy   . Anxiety   . Bone cancer ([place])    t_spine T-5  . Compression fracture   . Encounter for antineoplastic chemotherapy 01/31/2015  . Hearing loss   . Melanoma ([place])   . Multiple myeloma  ([place])   . Prostate cancer ([place]) 2002  . S/P radiation therapy 08/21/14-09/04/14   T4-6 25Gy/46f  . S/P radiation therapy 11/29/14-12/12/14   rt prox humerus/shoulder/scapula 25Gy/155f . Skin cancer 1994   melanoma  right neck   . [place]  . Thyroid disease     ALLERGIES:  is allergic to iodine.  MEDICATIONS:  Current Outpatient Prescriptions  Medication Sig Dispense Refill  . aspirin 81 MG EC tablet Take 81 mg by mouth daily.  0  . Calcium Carbonate-Vitamin D (CALTRATE 600+D PO) Take 600 mg by mouth 2 (two) times daily.    . cholecalciferol (VITAMIN D) 1000 UNITS tablet Take 1,000 Units by mouth at bedtime.     . diphenoxylate-atropine (LOMOTIL) 2.5-0.025 MG tablet Take 2 tablets by mouth 4 (four) times daily as needed for diarrhea or loose stools. 30 tablet 0  . furosemide (LASIX) 20 MG tablet Take 20 mg by mouth daily. Pt takes 1 tablet daily except on Mon and Thurs takes 2 tablets    . KLOR-CON 10 10 MEQ tablet Take 10 mEq by mouth daily.    . Marland Kitchenevothyroxine (SYNTHROID, LEVOTHROID) 100 MCG tablet Take 100 mcg by mouth daily before breakfast.    . methocarbamol (ROBAXIN) 500 MG tablet TAKE 1 TABLET EVERY 6 HOURS AS NEEDED FOR MUSCLE SPASMS 180 tablet 4  . Multiple Vitamins-Minerals (CENTRUM SILVER PO) Take 1 tablet by mouth daily.    . Marland Kitchenmeprazole (PRILOSEC) 40 MG capsule Take 40 mg by mouth daily as needed (heartburn).     . ranitidine (ZANTAC) 150 MG tablet Take 150 mg by  mouth daily as needed for heartburn.   11  . sennosides-docusate sodium (SENOKOT-S) 8.6-50 MG tablet Take 1-2 tablets by mouth 2 (two) times daily as needed for constipation. Reported on 01/23/2016    . tamsulosin (FLOMAX) 0.4 MG CAPS capsule Take 1 capsule (0.4 mg total) by mouth at bedtime. 30 capsule 3   No current facility-administered medications for this visit.    Facility-Administered Medications Ordered in Other Visits  Medication Dose Route Frequency Provider Last Rate Last Dose  . sodium chloride 0.9  % injection 10 mL  10 mL Intracatheter PRN Curt Bears, MD        SURGICAL HISTORY:  Past Surgical History:  Procedure Laterality Date  . HERNIA REPAIR    . POSTERIOR CERVICAL FUSION/FORAMINOTOMY N/A 06/17/2014   Procedure: Thoracic five laminectomy for epidural tumor resection Thoracic 4-7 posterior lateral arthrodesis, segmental pedicle screw fixation.;  Surgeon: Ashok Pall, MD;  Location: [facility];  Service: Neurosurgery;  Laterality: N/A;    REVIEW OF SYSTEMS:  A comprehensive review of systems was negative except for: Musculoskeletal: positive for muscle weakness Neurological: positive for gait problems   PHYSICAL EXAMINATION: General appearance: alert, cooperative and no distress Head: Normocephalic, without obvious abnormality, atraumatic Neck: no adenopathy, no JVD, supple, symmetrical, trachea midline and thyroid not enlarged, symmetric, no tenderness/mass/nodules Lymph nodes: Cervical, supraclavicular, and axillary nodes normal. Resp: clear to auscultation bilaterally Back: symmetric, no curvature. ROM normal. No CVA tenderness. Cardio: regular rate and rhythm, S1, S2 normal, no murmur, click, rub or gallop GI: soft, non-tender; bowel sounds normal; no masses,  no organomegaly Extremities: extremities normal, atraumatic, no cyanosis or edema  ECOG PERFORMANCE STATUS: 1 - Symptomatic but completely ambulatory  Blood pressure 129/71, pulse 76, temperature 98.6 F (37 C), temperature source Oral, resp. rate 20, height '5\' 9"'  (1.753 m), weight 152 lb 6.4 oz (69.1 kg), SpO2 99 %.  LABORATORY DATA: Lab Results  Component Value Date   WBC 4.7 01/14/2017   HGB 13.1 01/14/2017   HCT 39.3 01/14/2017   MCV 96.8 01/14/2017   PLT 119 (L) 01/14/2017      Chemistry      Component Value Date/Time   NA 141 01/14/2017 1048   K 4.2 01/14/2017 1048   CL 101 06/18/2016 1140   CO2 31 (H) 01/14/2017 1048   BUN 25.4 01/14/2017 1048   CREATININE 1.1 01/14/2017 1048        Component Value Date/Time   CALCIUM 9.7 01/14/2017 1048   ALKPHOS 54 01/14/2017 1048   AST 19 01/14/2017 1048   ALT 15 01/14/2017 1048   BILITOT 0.63 01/14/2017 1048       RADIOGRAPHIC STUDIES: No results found.  ASSESSMENT AND PLAN:  This is a very pleasant 83 years old white male with history of multiple myeloma diagnosed in November 2015 status post several chemotherapy regimens as well as radiation and he is currently on observation. The patient has been doing fine with no specific complaints. He had repeat myeloma panel performed recently that showed no clear evidence for disease progression. I recommended for him for him to continue on observation with repeat myeloma panel in 3 months. The patient will also continue on his treatment with Zometa every 3 months. For the balance issue he was advised to see his neurologist for evaluation. He was advised to call immediately if he has any concerning symptoms in the interval. The patient voices understanding of current disease status and treatment options and is in agreement with the current  care plan.  All questions were answered. The patient knows to call the clinic with any problems, questions or concerns. We can certainly see the patient much sooner if necessary. I spent 10 minutes counseling the patient face to face. The total time spent in the appointment was 15 minutes.   Disclaimer: This note was dictated with voice recognition software. Similar sounding words can inadvertently be transcribed and may not be corrected upon review.

## 2017-01-21 NOTE — Telephone Encounter (Signed)
Scheduled appt per 6/20 los - added additional cycle - every three months zometa

## 2017-01-21 NOTE — Patient Instructions (Signed)

## 2017-01-27 DIAGNOSIS — C9000 Multiple myeloma not having achieved remission: Secondary | ICD-10-CM | POA: Diagnosis not present

## 2017-03-03 DIAGNOSIS — H6123 Impacted cerumen, bilateral: Secondary | ICD-10-CM | POA: Diagnosis not present

## 2017-04-10 DIAGNOSIS — H04123 Dry eye syndrome of bilateral lacrimal glands: Secondary | ICD-10-CM | POA: Diagnosis not present

## 2017-04-10 DIAGNOSIS — Z961 Presence of intraocular lens: Secondary | ICD-10-CM | POA: Diagnosis not present

## 2017-04-10 DIAGNOSIS — H02831 Dermatochalasis of right upper eyelid: Secondary | ICD-10-CM | POA: Diagnosis not present

## 2017-04-10 DIAGNOSIS — H02834 Dermatochalasis of left upper eyelid: Secondary | ICD-10-CM | POA: Diagnosis not present

## 2017-04-15 ENCOUNTER — Other Ambulatory Visit (HOSPITAL_BASED_OUTPATIENT_CLINIC_OR_DEPARTMENT_OTHER): Payer: Medicare Other

## 2017-04-15 DIAGNOSIS — C7951 Secondary malignant neoplasm of bone: Secondary | ICD-10-CM

## 2017-04-15 DIAGNOSIS — C419 Malignant neoplasm of bone and articular cartilage, unspecified: Secondary | ICD-10-CM | POA: Diagnosis not present

## 2017-04-15 DIAGNOSIS — C9000 Multiple myeloma not having achieved remission: Secondary | ICD-10-CM

## 2017-04-15 LAB — CBC WITH DIFFERENTIAL/PLATELET
BASO%: 0.2 % (ref 0.0–2.0)
BASOS ABS: 0 10*3/uL (ref 0.0–0.1)
EOS%: 1.2 % (ref 0.0–7.0)
Eosinophils Absolute: 0.1 10*3/uL (ref 0.0–0.5)
HCT: 38.7 % (ref 38.4–49.9)
HGB: 12.6 g/dL — ABNORMAL LOW (ref 13.0–17.1)
LYMPH%: 17.8 % (ref 14.0–49.0)
MCH: 32 pg (ref 27.2–33.4)
MCHC: 32.6 g/dL (ref 32.0–36.0)
MCV: 98.2 fL — AB (ref 79.3–98.0)
MONO#: 0.4 10*3/uL (ref 0.1–0.9)
MONO%: 7.8 % (ref 0.0–14.0)
NEUT#: 3.6 10*3/uL (ref 1.5–6.5)
NEUT%: 73 % (ref 39.0–75.0)
Platelets: 113 10*3/uL — ABNORMAL LOW (ref 140–400)
RBC: 3.94 10*6/uL — AB (ref 4.20–5.82)
RDW: 13.9 % (ref 11.0–14.6)
WBC: 4.9 10*3/uL (ref 4.0–10.3)
lymph#: 0.9 10*3/uL (ref 0.9–3.3)

## 2017-04-15 LAB — COMPREHENSIVE METABOLIC PANEL
ALT: 13 U/L (ref 0–55)
AST: 18 U/L (ref 5–34)
Albumin: 3.8 g/dL (ref 3.5–5.0)
Alkaline Phosphatase: 60 U/L (ref 40–150)
Anion Gap: 8 mEq/L (ref 3–11)
BUN: 25.8 mg/dL (ref 7.0–26.0)
CO2: 29 mEq/L (ref 22–29)
Calcium: 9.4 mg/dL (ref 8.4–10.4)
Chloride: 105 mEq/L (ref 98–109)
Creatinine: 1 mg/dL (ref 0.7–1.3)
EGFR: 72 mL/min/{1.73_m2} — AB (ref >90)
Glucose: 74 mg/dl (ref 70–140)
POTASSIUM: 4.3 meq/L (ref 3.5–5.1)
SODIUM: 142 meq/L (ref 136–145)
Total Bilirubin: 0.47 mg/dL (ref 0.20–1.20)
Total Protein: 6.3 g/dL — ABNORMAL LOW (ref 6.4–8.3)

## 2017-04-15 LAB — LACTATE DEHYDROGENASE: LDH: 170 U/L (ref 125–245)

## 2017-04-16 LAB — BETA 2 MICROGLOBULIN, SERUM: BETA 2: 2.5 mg/L — AB (ref 0.6–2.4)

## 2017-04-16 LAB — IGG, IGA, IGM
IgA, Qn, Serum: 74 mg/dL (ref 61–437)
IgG, Qn, Serum: 687 mg/dL — ABNORMAL LOW (ref 700–1600)
IgM, Qn, Serum: 37 mg/dL (ref 15–143)

## 2017-04-16 LAB — KAPPA/LAMBDA LIGHT CHAINS
IG LAMBDA FREE LIGHT CHAIN: 8.9 mg/L (ref 5.7–26.3)
Ig Kappa Free Light Chain: 9 mg/L (ref 3.3–19.4)
KAPPA/LAMBDA FLC RATIO: 1.01 (ref 0.26–1.65)

## 2017-04-21 DIAGNOSIS — Z0000 Encounter for general adult medical examination without abnormal findings: Secondary | ICD-10-CM | POA: Diagnosis not present

## 2017-04-21 DIAGNOSIS — C61 Malignant neoplasm of prostate: Secondary | ICD-10-CM | POA: Diagnosis not present

## 2017-04-21 DIAGNOSIS — E039 Hypothyroidism, unspecified: Secondary | ICD-10-CM | POA: Diagnosis not present

## 2017-04-21 DIAGNOSIS — L719 Rosacea, unspecified: Secondary | ICD-10-CM | POA: Diagnosis not present

## 2017-04-21 DIAGNOSIS — Z1389 Encounter for screening for other disorder: Secondary | ICD-10-CM | POA: Diagnosis not present

## 2017-04-21 DIAGNOSIS — M6281 Muscle weakness (generalized): Secondary | ICD-10-CM | POA: Diagnosis not present

## 2017-04-21 DIAGNOSIS — C439 Malignant melanoma of skin, unspecified: Secondary | ICD-10-CM | POA: Diagnosis not present

## 2017-04-21 DIAGNOSIS — R269 Unspecified abnormalities of gait and mobility: Secondary | ICD-10-CM | POA: Diagnosis not present

## 2017-04-21 DIAGNOSIS — Z23 Encounter for immunization: Secondary | ICD-10-CM | POA: Diagnosis not present

## 2017-04-21 DIAGNOSIS — I6340 Cerebral infarction due to embolism of unspecified cerebral artery: Secondary | ICD-10-CM | POA: Diagnosis not present

## 2017-04-21 DIAGNOSIS — R7309 Other abnormal glucose: Secondary | ICD-10-CM | POA: Diagnosis not present

## 2017-04-21 DIAGNOSIS — C9000 Multiple myeloma not having achieved remission: Secondary | ICD-10-CM | POA: Diagnosis not present

## 2017-04-21 DIAGNOSIS — D696 Thrombocytopenia, unspecified: Secondary | ICD-10-CM | POA: Diagnosis not present

## 2017-04-21 DIAGNOSIS — K219 Gastro-esophageal reflux disease without esophagitis: Secondary | ICD-10-CM | POA: Diagnosis not present

## 2017-04-22 ENCOUNTER — Encounter: Payer: Self-pay | Admitting: Internal Medicine

## 2017-04-22 ENCOUNTER — Ambulatory Visit (HOSPITAL_BASED_OUTPATIENT_CLINIC_OR_DEPARTMENT_OTHER): Payer: Medicare Other | Admitting: Internal Medicine

## 2017-04-22 ENCOUNTER — Ambulatory Visit (HOSPITAL_BASED_OUTPATIENT_CLINIC_OR_DEPARTMENT_OTHER): Payer: Medicare Other

## 2017-04-22 VITALS — BP 126/45 | HR 77 | Temp 98.1°F | Resp 18 | Wt 155.7 lb

## 2017-04-22 DIAGNOSIS — C9000 Multiple myeloma not having achieved remission: Secondary | ICD-10-CM | POA: Diagnosis not present

## 2017-04-22 DIAGNOSIS — C7951 Secondary malignant neoplasm of bone: Secondary | ICD-10-CM

## 2017-04-22 MED ORDER — SODIUM CHLORIDE 0.9 % IV SOLN
INTRAVENOUS | Status: DC
  Administered 2017-04-22: 11:00:00 via INTRAVENOUS

## 2017-04-22 MED ORDER — ZOLEDRONIC ACID 4 MG/100ML IV SOLN
4.0000 mg | Freq: Once | INTRAVENOUS | Status: AC
  Administered 2017-04-22: 4 mg via INTRAVENOUS
  Filled 2017-04-22: qty 100

## 2017-04-22 NOTE — Patient Instructions (Signed)

## 2017-04-22 NOTE — Progress Notes (Signed)
[facility] [phone]   Fax:(336) [phone]  OFFICE PROGRESS NOTE  Wenda Low, MD [address] [place] [address]  DIAGNOSIS: Multiple myeloma diagnosed in November 2015.  PRIOR THERAPY: 1) Status post Thoracic five laminectomy for epidural tumor resection, Thoracic 4-7 posterior lateral arthrodesis,  segmental pedicle screw fixation T4-T7 Globus instrumentation under the care of Dr. Christella Noa on 06/18/2014. 2) Systemic chemotherapy with Velcade 1.3 MG/M2 subcutaneously weekly in addition to Decadron 40 mg by mouth on weekly basis. Status post 11 cycles. 3) palliative radiotherapy to the right proximal humerus, shoulder and scapula under the care of Dr. Lisbeth Renshaw completed on 12/12/2014. 4)  Systemic chemotherapy with Velcade 1.3 MG/M2 subcutaneously weekly, Decadron 40 mg by mouth weekly in addition to Revlimid 25 MG by mouth daily for 21 days every 4 weeks, status post 14 cycles.   CURRENT THERAPY: Zometa 4 mg IV every 12 weeks. First dose was given 11/01/2014.  INTERVAL HISTORY: Jeremiah Owens 83 y.o. male returns to the clinic for follow-up visit accompanied by his wife. The patient is feeling fine today with no specific complaints except for the weakness in the lower extremities. He try to exercise at regular basis. He denied having any chest pain, shortness of breath, cough or hemoptysis. He denied having any fever or chills. He has no weight loss or night sweats. He denied having any nausea, vomiting, diarrhea or constipation. He had repeat myeloma panel performed recently and he is here for evaluation and discussion of his lab results and treatment options.  MEDICAL HISTORY: Past Medical History:  Diagnosis Date  . Allergy   . Anxiety   . Bone cancer ([place])    t_spine T-5  . Compression fracture   . Encounter for antineoplastic chemotherapy 01/31/2015  . Hearing loss   . Melanoma ([place])   . Multiple myeloma ([place])   . Prostate  cancer ([place]) 2002  . S/P radiation therapy 08/21/14-09/04/14   T4-6 25Gy/49f  . S/P radiation therapy 11/29/14-12/12/14   rt prox humerus/shoulder/scapula 25Gy/145f . Skin cancer 1994   melanoma  right neck   . [place]  . Thyroid disease     ALLERGIES:  is allergic to iodine.  MEDICATIONS:  Current Outpatient Prescriptions  Medication Sig Dispense Refill  . aspirin 81 MG EC tablet Take 81 mg by mouth daily.  0  . Calcium Carbonate-Vitamin D (CALTRATE 600+D PO) Take 600 mg by mouth 2 (two) times daily.    . cholecalciferol (VITAMIN D) 1000 UNITS tablet Take 1,000 Units by mouth at bedtime.     . diphenoxylate-atropine (LOMOTIL) 2.5-0.025 MG tablet Take 2 tablets by mouth 4 (four) times daily as needed for diarrhea or loose stools. 30 tablet 0  . furosemide (LASIX) 20 MG tablet Take 20 mg by mouth daily. Pt takes 1 tablet daily except on Mon and Thurs takes 2 tablets    . KLOR-CON 10 10 MEQ tablet Take 10 mEq by mouth daily.    . Marland Kitchenevothyroxine (SYNTHROID, LEVOTHROID) 100 MCG tablet Take 100 mcg by mouth daily before breakfast.    . methocarbamol (ROBAXIN) 500 MG tablet TAKE 1 TABLET EVERY 6 HOURS AS NEEDED FOR MUSCLE SPASMS 180 tablet 4  . Multiple Vitamins-Minerals (CENTRUM SILVER PO) Take 1 tablet by mouth daily.    . Marland Kitchenmeprazole (PRILOSEC) 40 MG capsule Take 40 mg by mouth daily as needed (heartburn).     . ranitidine (ZANTAC) 150 MG tablet Take 150 mg  by mouth daily as needed for heartburn.   11  . sennosides-docusate sodium (SENOKOT-S) 8.6-50 MG tablet Take 1-2 tablets by mouth 2 (two) times daily as needed for constipation. Reported on 01/23/2016    . tamsulosin (FLOMAX) 0.4 MG CAPS capsule Take 1 capsule (0.4 mg total) by mouth at bedtime. 30 capsule 3   No current facility-administered medications for this visit.    Facility-Administered Medications Ordered in Other Visits  Medication Dose Route Frequency Provider Last Rate Last Dose  . sodium chloride 0.9 % injection 10 mL   10 mL Intracatheter PRN Curt Bears, MD        SURGICAL HISTORY:  Past Surgical History:  Procedure Laterality Date  . HERNIA REPAIR    . POSTERIOR CERVICAL FUSION/FORAMINOTOMY N/A 06/17/2014   Procedure: Thoracic five laminectomy for epidural tumor resection Thoracic 4-7 posterior lateral arthrodesis, segmental pedicle screw fixation.;  Surgeon: Ashok Pall, MD;  Location: [facility];  Service: Neurosurgery;  Laterality: N/A;    REVIEW OF SYSTEMS:  A comprehensive review of systems was negative except for: Musculoskeletal: positive for muscle weakness Neurological: positive for gait problems   PHYSICAL EXAMINATION: General appearance: alert, cooperative and no distress Head: Normocephalic, without obvious abnormality, atraumatic Neck: no adenopathy, no JVD, supple, symmetrical, trachea midline and thyroid not enlarged, symmetric, no tenderness/mass/nodules Lymph nodes: Cervical, supraclavicular, and axillary nodes normal. Resp: clear to auscultation bilaterally Back: symmetric, no curvature. ROM normal. No CVA tenderness. Cardio: regular rate and rhythm, S1, S2 normal, no murmur, click, rub or gallop GI: soft, non-tender; bowel sounds normal; no masses,  no organomegaly Extremities: extremities normal, atraumatic, no cyanosis or edema  ECOG PERFORMANCE STATUS: 1 - Symptomatic but completely ambulatory  Blood pressure (!) 126/45, pulse 77, temperature 98.1 F (36.7 C), temperature source Oral, resp. rate 18, weight 155 lb 11.2 oz (70.6 kg), SpO2 99 %.  LABORATORY DATA: Lab Results  Component Value Date   WBC 4.9 04/15/2017   HGB 12.6 (L) 04/15/2017   HCT 38.7 04/15/2017   MCV 98.2 (H) 04/15/2017   PLT 113 (L) 04/15/2017      Chemistry      Component Value Date/Time   NA 142 04/15/2017 1014   K 4.3 04/15/2017 1014   CL 101 06/18/2016 1140   CO2 29 04/15/2017 1014   BUN 25.8 04/15/2017 1014   CREATININE 1.0 04/15/2017 1014      Component Value Date/Time    CALCIUM 9.4 04/15/2017 1014   ALKPHOS 60 04/15/2017 1014   AST 18 04/15/2017 1014   ALT 13 04/15/2017 1014   BILITOT 0.47 04/15/2017 1014       RADIOGRAPHIC STUDIES: No results found.  ASSESSMENT AND PLAN:  This is a very pleasant 83 years old white male with history of multiple myeloma diagnosed in November 2015 status post several chemotherapy regimens as well as radiation and he is currently on observation. The patient continues to do well with no significant complaints. His myeloma panel performed recently showed no evidence for disease progression. I recommended for the patient to continue on observation. I would see him back for follow-up visit in 3 months for reevaluation with repeat myeloma panel. He was advised to call immediately if he has any concerning symptoms in the interval. The patient voices understanding of current disease status and treatment options and is in agreement with the current care plan.  All questions were answered. The patient knows to call the clinic with any problems, questions or concerns. We can certainly see  the patient much sooner if necessary. I spent 10 minutes counseling the patient face to face. The total time spent in the appointment was 15 minutes.  Disclaimer: This note was dictated with voice recognition software. Similar sounding words can inadvertently be transcribed and may not be corrected upon review.

## 2017-06-22 DIAGNOSIS — E039 Hypothyroidism, unspecified: Secondary | ICD-10-CM | POA: Diagnosis not present

## 2017-06-30 DIAGNOSIS — D1801 Hemangioma of skin and subcutaneous tissue: Secondary | ICD-10-CM | POA: Diagnosis not present

## 2017-06-30 DIAGNOSIS — L570 Actinic keratosis: Secondary | ICD-10-CM | POA: Diagnosis not present

## 2017-06-30 DIAGNOSIS — L720 Epidermal cyst: Secondary | ICD-10-CM | POA: Diagnosis not present

## 2017-06-30 DIAGNOSIS — M7130 Other bursal cyst, unspecified site: Secondary | ICD-10-CM | POA: Diagnosis not present

## 2017-06-30 DIAGNOSIS — Z85828 Personal history of other malignant neoplasm of skin: Secondary | ICD-10-CM | POA: Diagnosis not present

## 2017-06-30 DIAGNOSIS — L738 Other specified follicular disorders: Secondary | ICD-10-CM | POA: Diagnosis not present

## 2017-06-30 DIAGNOSIS — D485 Neoplasm of uncertain behavior of skin: Secondary | ICD-10-CM | POA: Diagnosis not present

## 2017-06-30 DIAGNOSIS — L821 Other seborrheic keratosis: Secondary | ICD-10-CM | POA: Diagnosis not present

## 2017-06-30 DIAGNOSIS — L814 Other melanin hyperpigmentation: Secondary | ICD-10-CM | POA: Diagnosis not present

## 2017-07-15 ENCOUNTER — Other Ambulatory Visit (HOSPITAL_BASED_OUTPATIENT_CLINIC_OR_DEPARTMENT_OTHER): Payer: Medicare Other

## 2017-07-15 DIAGNOSIS — C7951 Secondary malignant neoplasm of bone: Secondary | ICD-10-CM

## 2017-07-15 DIAGNOSIS — C9000 Multiple myeloma not having achieved remission: Secondary | ICD-10-CM | POA: Diagnosis not present

## 2017-07-15 LAB — CBC WITH DIFFERENTIAL/PLATELET
BASO%: 0.2 % (ref 0.0–2.0)
Basophils Absolute: 0 10*3/uL (ref 0.0–0.1)
EOS ABS: 0.1 10*3/uL (ref 0.0–0.5)
EOS%: 1.2 % (ref 0.0–7.0)
HEMATOCRIT: 40.6 % (ref 38.4–49.9)
HGB: 13.4 g/dL (ref 13.0–17.1)
LYMPH#: 0.7 10*3/uL — AB (ref 0.9–3.3)
LYMPH%: 14 % (ref 14.0–49.0)
MCH: 32.1 pg (ref 27.2–33.4)
MCHC: 33 g/dL (ref 32.0–36.0)
MCV: 97.4 fL (ref 79.3–98.0)
MONO#: 0.3 10*3/uL (ref 0.1–0.9)
MONO%: 6.7 % (ref 0.0–14.0)
NEUT%: 77.9 % — AB (ref 39.0–75.0)
NEUTROS ABS: 4 10*3/uL (ref 1.5–6.5)
PLATELETS: 121 10*3/uL — AB (ref 140–400)
RBC: 4.17 10*6/uL — ABNORMAL LOW (ref 4.20–5.82)
RDW: 13.9 % (ref 11.0–14.6)
WBC: 5.1 10*3/uL (ref 4.0–10.3)

## 2017-07-15 LAB — LACTATE DEHYDROGENASE: LDH: 184 U/L (ref 125–245)

## 2017-07-15 LAB — COMPREHENSIVE METABOLIC PANEL
ALT: 16 U/L (ref 0–55)
AST: 20 U/L (ref 5–34)
Albumin: 4.1 g/dL (ref 3.5–5.0)
Alkaline Phosphatase: 56 U/L (ref 40–150)
Anion Gap: 10 mEq/L (ref 3–11)
BUN: 18 mg/dL (ref 7.0–26.0)
CO2: 27 meq/L (ref 22–29)
Calcium: 9.2 mg/dL (ref 8.4–10.4)
Chloride: 105 mEq/L (ref 98–109)
Creatinine: 1.1 mg/dL (ref 0.7–1.3)
GLUCOSE: 86 mg/dL (ref 70–140)
POTASSIUM: 4.4 meq/L (ref 3.5–5.1)
SODIUM: 141 meq/L (ref 136–145)
Total Bilirubin: 0.61 mg/dL (ref 0.20–1.20)
Total Protein: 6.5 g/dL (ref 6.4–8.3)

## 2017-07-16 LAB — KAPPA/LAMBDA LIGHT CHAINS
IG LAMBDA FREE LIGHT CHAIN: 9.5 mg/L (ref 5.7–26.3)
Ig Kappa Free Light Chain: 10.9 mg/L (ref 3.3–19.4)
KAPPA/LAMBDA FLC RATIO: 1.15 (ref 0.26–1.65)

## 2017-07-16 LAB — IGG, IGA, IGM
IGA/IMMUNOGLOBULIN A, SERUM: 82 mg/dL (ref 61–437)
IGG (IMMUNOGLOBIN G), SERUM: 654 mg/dL — AB (ref 700–1600)
IgM, Qn, Serum: 47 mg/dL (ref 15–143)

## 2017-07-16 LAB — BETA 2 MICROGLOBULIN, SERUM: Beta-2: 2 mg/L (ref 0.6–2.4)

## 2017-07-22 ENCOUNTER — Encounter: Payer: Self-pay | Admitting: Internal Medicine

## 2017-07-22 ENCOUNTER — Ambulatory Visit (HOSPITAL_BASED_OUTPATIENT_CLINIC_OR_DEPARTMENT_OTHER): Payer: Medicare Other | Admitting: Internal Medicine

## 2017-07-22 ENCOUNTER — Ambulatory Visit (HOSPITAL_BASED_OUTPATIENT_CLINIC_OR_DEPARTMENT_OTHER): Payer: Medicare Other

## 2017-07-22 VITALS — BP 118/45 | HR 77 | Temp 97.9°F | Resp 18 | Ht 69.0 in | Wt 155.7 lb

## 2017-07-22 DIAGNOSIS — C9000 Multiple myeloma not having achieved remission: Secondary | ICD-10-CM

## 2017-07-22 DIAGNOSIS — C7951 Secondary malignant neoplasm of bone: Secondary | ICD-10-CM

## 2017-07-22 DIAGNOSIS — C419 Malignant neoplasm of bone and articular cartilage, unspecified: Secondary | ICD-10-CM

## 2017-07-22 MED ORDER — ZOLEDRONIC ACID 4 MG/100ML IV SOLN
4.0000 mg | Freq: Once | INTRAVENOUS | Status: AC
  Administered 2017-07-22: 4 mg via INTRAVENOUS
  Filled 2017-07-22: qty 100

## 2017-07-22 MED ORDER — SODIUM CHLORIDE 0.9 % IV SOLN
Freq: Once | INTRAVENOUS | Status: AC
  Administered 2017-07-22: 12:00:00 via INTRAVENOUS

## 2017-07-22 NOTE — Patient Instructions (Signed)

## 2017-07-22 NOTE — Progress Notes (Signed)
[facility] Telephone:(336) [phone]   Fax:(336) [phone]  OFFICE PROGRESS NOTE  Wenda Low, MD [address] [place] [address]  DIAGNOSIS: Multiple myeloma diagnosed in November 2015.  PRIOR THERAPY: 1) Status post Thoracic five laminectomy for epidural tumor resection, Thoracic 4-7 posterior lateral arthrodesis,  segmental pedicle screw fixation T4-T7 Globus instrumentation under the care of Dr. Christella Noa on 06/18/2014. 2) Systemic chemotherapy with Velcade 1.3 MG/M2 subcutaneously weekly in addition to Decadron 40 mg by mouth on weekly basis. Status post 11 cycles. 3) palliative radiotherapy to the right proximal humerus, shoulder and scapula under the care of Dr. Lisbeth Renshaw completed on 12/12/2014. 4)  Systemic chemotherapy with Velcade 1.3 MG/M2 subcutaneously weekly, Decadron 40 mg by mouth weekly in addition to Revlimid 25 MG by mouth daily for 21 days every 4 weeks, status post 14 cycles.   CURRENT THERAPY: Zometa 4 mg IV every 12 weeks. First dose was given 11/01/2014.  INTERVAL HISTORY: Jeremiah Owens 83 y.o. male returns to the clinic today for follow-up visit accompanied by his wife.  The patient is feeling fine today with no specific complaints except for some issues with his balance because of the drop foot.  He exercises 3 times a week.  He denied having any chest pain, shortness of breath, cough or hemoptysis.  He denied having any weight loss or night sweats.  He has no nausea, vomiting, diarrhea or constipation.  He is here today for evaluation with repeat myeloma panel.   MEDICAL HISTORY: Past Medical History:  Diagnosis Date  . Allergy   . Anxiety   . Bone cancer ([place])    t_spine T-5  . Compression fracture   . Encounter for antineoplastic chemotherapy 01/31/2015  . Hearing loss   . Melanoma ([place])   . Multiple myeloma ([place])   . Prostate cancer ([place]) 2002  . S/P radiation therapy 08/21/14-09/04/14   T4-6 25Gy/33f  . S/P  radiation therapy 11/29/14-12/12/14   rt prox humerus/shoulder/scapula 25Gy/190f . Skin cancer 1994   melanoma  right neck   . [place]  . Thyroid disease     ALLERGIES:  is allergic to iodine.  MEDICATIONS:  Current Outpatient Medications  Medication Sig Dispense Refill  . aspirin 81 MG EC tablet Take 81 mg by mouth daily.  0  . Calcium Carbonate-Vitamin D (CALTRATE 600+D PO) Take 600 mg by mouth 2 (two) times daily.    . cholecalciferol (VITAMIN D) 1000 UNITS tablet Take 1,000 Units by mouth at bedtime.     . diphenoxylate-atropine (LOMOTIL) 2.5-0.025 MG tablet Take 2 tablets by mouth 4 (four) times daily as needed for diarrhea or loose stools. 30 tablet 0  . furosemide (LASIX) 20 MG tablet Take 20 mg by mouth daily. Pt takes 1 tablet daily except on Mon and Thurs takes 2 tablets    . KLOR-CON 10 10 MEQ tablet Take 10 mEq by mouth daily.    . Marland Kitchenevothyroxine (SYNTHROID, LEVOTHROID) 100 MCG tablet Take 100 mcg by mouth daily before breakfast.    . methocarbamol (ROBAXIN) 500 MG tablet TAKE 1 TABLET EVERY 6 HOURS AS NEEDED FOR MUSCLE SPASMS 180 tablet 4  . Multiple Vitamins-Minerals (CENTRUM SILVER PO) Take 1 tablet by mouth daily.    . Marland Kitchenmeprazole (PRILOSEC) 40 MG capsule Take 40 mg by mouth daily as needed (heartburn).     . ranitidine (ZANTAC) 150 MG tablet Take 150 mg by mouth daily as needed for heartburn.  11  . sennosides-docusate sodium (SENOKOT-S) 8.6-50 MG tablet Take 1-2 tablets by mouth 2 (two) times daily as needed for constipation. Reported on 01/23/2016    . tamsulosin (FLOMAX) 0.4 MG CAPS capsule Take 1 capsule (0.4 mg total) by mouth at bedtime. 30 capsule 3   No current facility-administered medications for this visit.    Facility-Administered Medications Ordered in Other Visits  Medication Dose Route Frequency Provider Last Rate Last Dose  . sodium chloride 0.9 % injection 10 mL  10 mL Intracatheter PRN Curt Bears, MD        SURGICAL HISTORY:  Past  Surgical History:  Procedure Laterality Date  . HERNIA REPAIR    . POSTERIOR CERVICAL FUSION/FORAMINOTOMY N/A 06/17/2014   Procedure: Thoracic five laminectomy for epidural tumor resection Thoracic 4-7 posterior lateral arthrodesis, segmental pedicle screw fixation.;  Surgeon: Ashok Pall, MD;  Location: [facility];  Service: Neurosurgery;  Laterality: N/A;    REVIEW OF SYSTEMS:  A comprehensive review of systems was negative except for: Musculoskeletal: positive for muscle weakness Neurological: positive for gait problems   PHYSICAL EXAMINATION: General appearance: alert, cooperative and no distress Head: Normocephalic, without obvious abnormality, atraumatic Neck: no adenopathy, no JVD, supple, symmetrical, trachea midline and thyroid not enlarged, symmetric, no tenderness/mass/nodules Lymph nodes: Cervical, supraclavicular, and axillary nodes normal. Resp: clear to auscultation bilaterally Back: symmetric, no curvature. ROM normal. No CVA tenderness. Cardio: regular rate and rhythm, S1, S2 normal, no murmur, click, rub or gallop GI: soft, non-tender; bowel sounds normal; no masses,  no organomegaly Extremities: extremities normal, atraumatic, no cyanosis or edema  ECOG PERFORMANCE STATUS: 1 - Symptomatic but completely ambulatory  Blood pressure (!) 118/45, pulse 77, temperature 97.9 F (36.6 C), temperature source Oral, resp. rate 18, height '5\' 9"'  (1.753 m), weight 155 lb 11.2 oz (70.6 kg), SpO2 100 %.  LABORATORY DATA: Lab Results  Component Value Date   WBC 5.1 07/15/2017   HGB 13.4 07/15/2017   HCT 40.6 07/15/2017   MCV 97.4 07/15/2017   PLT 121 (L) 07/15/2017      Chemistry      Component Value Date/Time   NA 141 07/15/2017 0949   K 4.4 07/15/2017 0949   CL 101 06/18/2016 1140   CO2 27 07/15/2017 0949   BUN 18.0 07/15/2017 0949   CREATININE 1.1 07/15/2017 0949      Component Value Date/Time   CALCIUM 9.2 07/15/2017 0949   ALKPHOS 56 07/15/2017 0949   AST 20  07/15/2017 0949   ALT 16 07/15/2017 0949   BILITOT 0.61 07/15/2017 0949       RADIOGRAPHIC STUDIES: No results found.  ASSESSMENT AND PLAN:  This is a very pleasant 83 years old white male with history of multiple myeloma diagnosed in November 2015 status post several chemotherapy regimens as well as radiation and he is currently on observation. The patient has no concerning complaints today except for the persistent balance problems secondary to dropfoot.  He is feeling fine otherwise. His recent myeloma panel showed no concerning findings for progression. I recommended for the patient to continue on observation with repeat myeloma panel in 3 months. He will also continue his treatment with Zometa every 3 months. The patient was advised to call immediately if he has any concerning symptoms in the interval. The patient voices understanding of current disease status and treatment options and is in agreement with the current care plan.  All questions were answered. The patient knows to call the clinic with any problems,  questions or concerns. We can certainly see the patient much sooner if necessary. I spent 10 minutes counseling the patient face to face. The total time spent in the appointment was 15 minutes.  Disclaimer: This note was dictated with voice recognition software. Similar sounding words can inadvertently be transcribed and may not be corrected upon review.

## 2017-08-06 DIAGNOSIS — E039 Hypothyroidism, unspecified: Secondary | ICD-10-CM | POA: Diagnosis not present

## 2017-08-12 DIAGNOSIS — H6123 Impacted cerumen, bilateral: Secondary | ICD-10-CM | POA: Diagnosis not present

## 2017-09-21 DIAGNOSIS — M25551 Pain in right hip: Secondary | ICD-10-CM | POA: Diagnosis not present

## 2017-10-14 ENCOUNTER — Inpatient Hospital Stay: Payer: Medicare Other | Attending: Internal Medicine

## 2017-10-14 ENCOUNTER — Other Ambulatory Visit: Payer: Self-pay | Admitting: Medical Oncology

## 2017-10-14 DIAGNOSIS — Z8546 Personal history of malignant neoplasm of prostate: Secondary | ICD-10-CM | POA: Insufficient documentation

## 2017-10-14 DIAGNOSIS — Z85820 Personal history of malignant melanoma of skin: Secondary | ICD-10-CM | POA: Diagnosis not present

## 2017-10-14 DIAGNOSIS — E079 Disorder of thyroid, unspecified: Secondary | ICD-10-CM | POA: Insufficient documentation

## 2017-10-14 DIAGNOSIS — C419 Malignant neoplasm of bone and articular cartilage, unspecified: Secondary | ICD-10-CM

## 2017-10-14 DIAGNOSIS — C9000 Multiple myeloma not having achieved remission: Secondary | ICD-10-CM | POA: Insufficient documentation

## 2017-10-14 DIAGNOSIS — Z8673 Personal history of transient ischemic attack (TIA), and cerebral infarction without residual deficits: Secondary | ICD-10-CM | POA: Diagnosis not present

## 2017-10-14 LAB — CBC WITH DIFFERENTIAL/PLATELET
Basophils Absolute: 0 10*3/uL (ref 0.0–0.1)
Basophils Relative: 1 %
EOS PCT: 2 %
Eosinophils Absolute: 0.1 10*3/uL (ref 0.0–0.5)
HEMATOCRIT: 39.6 % (ref 38.4–49.9)
Hemoglobin: 13.2 g/dL (ref 13.0–17.1)
LYMPHS ABS: 0.8 10*3/uL — AB (ref 0.9–3.3)
LYMPHS PCT: 17 %
MCH: 32.1 pg (ref 27.2–33.4)
MCHC: 33.3 g/dL (ref 32.0–36.0)
MCV: 96.5 fL (ref 79.3–98.0)
Monocytes Absolute: 0.4 10*3/uL (ref 0.1–0.9)
Monocytes Relative: 8 %
NEUTROS ABS: 3.5 10*3/uL (ref 1.5–6.5)
Neutrophils Relative %: 72 %
PLATELETS: 145 10*3/uL (ref 140–400)
RBC: 4.1 MIL/uL — ABNORMAL LOW (ref 4.20–5.82)
RDW: 14.6 % (ref 11.0–14.6)
WBC: 4.7 10*3/uL (ref 4.0–10.3)

## 2017-10-14 LAB — CMP (CANCER CENTER ONLY)
ALK PHOS: 56 U/L (ref 40–150)
ALT: 15 U/L (ref 0–55)
AST: 19 U/L (ref 5–34)
Albumin: 4 g/dL (ref 3.5–5.0)
Anion gap: 8 (ref 3–11)
BILIRUBIN TOTAL: 0.4 mg/dL (ref 0.2–1.2)
BUN: 25 mg/dL (ref 7–26)
CALCIUM: 9.6 mg/dL (ref 8.4–10.4)
CHLORIDE: 103 mmol/L (ref 98–109)
CO2: 30 mmol/L — ABNORMAL HIGH (ref 22–29)
CREATININE: 1.07 mg/dL (ref 0.70–1.30)
Glucose, Bld: 64 mg/dL — ABNORMAL LOW (ref 70–140)
Potassium: 4.1 mmol/L (ref 3.5–5.1)
Sodium: 141 mmol/L (ref 136–145)
TOTAL PROTEIN: 6.5 g/dL (ref 6.4–8.3)

## 2017-10-14 LAB — LACTATE DEHYDROGENASE: LDH: 190 U/L (ref 125–245)

## 2017-10-15 LAB — IGG, IGA, IGM
IGG (IMMUNOGLOBIN G), SERUM: 688 mg/dL — AB (ref 700–1600)
IgA: 87 mg/dL (ref 61–437)
IgM (Immunoglobulin M), Srm: 44 mg/dL (ref 15–143)

## 2017-10-15 LAB — BETA 2 MICROGLOBULIN, SERUM: BETA 2 MICROGLOBULIN: 2.2 mg/L (ref 0.6–2.4)

## 2017-10-15 LAB — KAPPA/LAMBDA LIGHT CHAINS
Kappa free light chain: 11.9 mg/L (ref 3.3–19.4)
Kappa, lambda light chain ratio: 1.2 (ref 0.26–1.65)
LAMDA FREE LIGHT CHAINS: 9.9 mg/L (ref 5.7–26.3)

## 2017-10-21 ENCOUNTER — Inpatient Hospital Stay (HOSPITAL_BASED_OUTPATIENT_CLINIC_OR_DEPARTMENT_OTHER): Payer: Medicare Other | Admitting: Internal Medicine

## 2017-10-21 ENCOUNTER — Inpatient Hospital Stay: Payer: Medicare Other

## 2017-10-21 ENCOUNTER — Telehealth: Payer: Self-pay | Admitting: Internal Medicine

## 2017-10-21 ENCOUNTER — Encounter: Payer: Self-pay | Admitting: Internal Medicine

## 2017-10-21 VITALS — BP 124/59 | HR 93 | Temp 98.2°F | Resp 20 | Ht 69.0 in | Wt 155.8 lb

## 2017-10-21 DIAGNOSIS — C9000 Multiple myeloma not having achieved remission: Secondary | ICD-10-CM

## 2017-10-21 DIAGNOSIS — E079 Disorder of thyroid, unspecified: Secondary | ICD-10-CM | POA: Diagnosis not present

## 2017-10-21 DIAGNOSIS — Z8546 Personal history of malignant neoplasm of prostate: Secondary | ICD-10-CM | POA: Diagnosis not present

## 2017-10-21 DIAGNOSIS — C7951 Secondary malignant neoplasm of bone: Secondary | ICD-10-CM

## 2017-10-21 DIAGNOSIS — Z8673 Personal history of transient ischemic attack (TIA), and cerebral infarction without residual deficits: Secondary | ICD-10-CM | POA: Diagnosis not present

## 2017-10-21 DIAGNOSIS — Z85820 Personal history of malignant melanoma of skin: Secondary | ICD-10-CM | POA: Diagnosis not present

## 2017-10-21 MED ORDER — ZOLEDRONIC ACID 4 MG/100ML IV SOLN
4.0000 mg | Freq: Once | INTRAVENOUS | Status: AC
  Administered 2017-10-21: 4 mg via INTRAVENOUS
  Filled 2017-10-21: qty 100

## 2017-10-21 NOTE — Telephone Encounter (Signed)
Scheduled appt per 3/20 los - patient is aware of appt date and time.

## 2017-10-21 NOTE — Progress Notes (Signed)
[facility] Telephone:(336) [phone]   Fax:(336) [phone]  OFFICE PROGRESS NOTE  Wenda Low, MD [address] [place] [address]  DIAGNOSIS: Multiple myeloma diagnosed in November 2015.  PRIOR THERAPY: 1) Status post Thoracic five laminectomy for epidural tumor resection, Thoracic 4-7 posterior lateral arthrodesis,  segmental pedicle screw fixation T4-T7 Globus instrumentation under the care of Dr. Christella Noa on 06/18/2014. 2) Systemic chemotherapy with Velcade 1.3 MG/M2 subcutaneously weekly in addition to Decadron 40 mg by mouth on weekly basis. Status post 11 cycles. 3) palliative radiotherapy to the right proximal humerus, shoulder and scapula under the care of Dr. Lisbeth Renshaw completed on 12/12/2014. 4)  Systemic chemotherapy with Velcade 1.3 MG/M2 subcutaneously weekly, Decadron 40 mg by mouth weekly in addition to Revlimid 25 MG by mouth daily for 21 days every 4 weeks, status post 14 cycles.   CURRENT THERAPY: Zometa 4 mg IV every 12 weeks. First dose was given 11/01/2014.  INTERVAL HISTORY: Jeremiah Owens 84 y.o. male returns to the clinic today for follow-up visit accompanied by his wife.  The patient is feeling fine today with no specific complaints except for balance issue.  He also has some hearing deficit and he scheduled to see his audiologist for further evaluation.  He denied having any recent chest pain, shortness breath, cough or hemoptysis.  He denied having any fever or chills.  He has no nausea, vomiting, diarrhea or constipation.  He had repeat myeloma panel performed recently and is here for evaluation and discussion of his lab results.   MEDICAL HISTORY: Past Medical History:  Diagnosis Date  . Allergy   . Anxiety   . Bone cancer ([place])    t_spine T-5  . Compression fracture   . Encounter for antineoplastic chemotherapy 01/31/2015  . Hearing loss   . Melanoma ([place])   . Multiple myeloma ([place])   . Prostate cancer ([place]) 2002  .  S/P radiation therapy 08/21/14-09/04/14   T4-6 25Gy/71f  . S/P radiation therapy 11/29/14-12/12/14   rt prox humerus/shoulder/scapula 25Gy/187f . Skin cancer 1994   melanoma  right neck   . [place] ([facility]  . Thyroid disease     ALLERGIES:  is allergic to iodine.  MEDICATIONS:  Current Outpatient Medications  Medication Sig Dispense Refill  . aspirin 81 MG EC tablet Take 81 mg by mouth daily.  0  . Calcium Carbonate-Vitamin D (CALTRATE 600+D PO) Take 600 mg by mouth 2 (two) times daily.    . cholecalciferol (VITAMIN D) 1000 UNITS tablet Take 1,000 Units by mouth at bedtime.     . diphenoxylate-atropine (LOMOTIL) 2.5-0.025 MG tablet Take 2 tablets by mouth 4 (four) times daily as needed for diarrhea or loose stools. 30 tablet 0  . furosemide (LASIX) 20 MG tablet Take 20 mg by mouth daily. Pt takes 1 tablet daily except on Mon and Thurs takes 2 tablets    . KLOR-CON 10 10 MEQ tablet Take 10 mEq by mouth daily.    . Marland Kitchenevothyroxine (SYNTHROID, LEVOTHROID) 100 MCG tablet Take 100 mcg by mouth daily before breakfast.    . levothyroxine (SYNTHROID, LEVOTHROID) 112 MCG tablet TK 1 T PO QD IN THE MORNING OES  6  . methocarbamol (ROBAXIN) 500 MG tablet TAKE 1 TABLET EVERY 6 HOURS AS NEEDED FOR MUSCLE SPASMS 180 tablet 4  . Multiple Vitamins-Minerals (CENTRUM SILVER PO) Take 1 tablet by mouth daily.    . Marland Kitchenmeprazole (PRILOSEC) 40 MG capsule Take 40 mg  by mouth daily as needed (heartburn).     . ranitidine (ZANTAC) 150 MG tablet Take 150 mg by mouth daily as needed for heartburn.   11  . sennosides-docusate sodium (SENOKOT-S) 8.6-50 MG tablet Take 1-2 tablets by mouth 2 (two) times daily as needed for constipation. Reported on 01/23/2016    . tamsulosin (FLOMAX) 0.4 MG CAPS capsule Take 1 capsule (0.4 mg total) by mouth at bedtime. 30 capsule 3  . TEXACORT 2.5 % SOLN APP TO THE SCALP HS PRN  2   No current facility-administered medications for this visit.    Facility-Administered Medications Ordered in  Other Visits  Medication Dose Route Frequency Provider Last Rate Last Dose  . sodium chloride 0.9 % injection 10 mL  10 mL Intracatheter PRN Curt Bears, MD        SURGICAL HISTORY:  Past Surgical History:  Procedure Laterality Date  . HERNIA REPAIR    . POSTERIOR CERVICAL FUSION/FORAMINOTOMY N/A 06/17/2014   Procedure: Thoracic five laminectomy for epidural tumor resection Thoracic 4-7 posterior lateral arthrodesis, segmental pedicle screw fixation.;  Surgeon: Ashok Pall, MD;  Location: [facility];  Service: Neurosurgery;  Laterality: N/A;    REVIEW OF SYSTEMS:  A comprehensive review of systems was negative except for: Musculoskeletal: positive for muscle weakness Neurological: positive for gait problems   PHYSICAL EXAMINATION: General appearance: alert, cooperative and no distress Head: Normocephalic, without obvious abnormality, atraumatic Neck: no adenopathy, no JVD, supple, symmetrical, trachea midline and thyroid not enlarged, symmetric, no tenderness/mass/nodules Lymph nodes: Cervical, supraclavicular, and axillary nodes normal. Resp: clear to auscultation bilaterally Back: symmetric, no curvature. ROM normal. No CVA tenderness. Cardio: regular rate and rhythm, S1, S2 normal, no murmur, click, rub or gallop GI: soft, non-tender; bowel sounds normal; no masses,  no organomegaly Extremities: extremities normal, atraumatic, no cyanosis or edema  ECOG PERFORMANCE STATUS: 1 - Symptomatic but completely ambulatory  Blood pressure (!) 124/59, pulse 93, temperature 98.2 F (36.8 C), temperature source Oral, resp. rate 20, height '5\' 9"'  (1.753 m), weight 155 lb 12.8 oz (70.7 kg), SpO2 100 %.  LABORATORY DATA: Lab Results  Component Value Date   WBC 4.7 10/14/2017   HGB 13.2 10/14/2017   HCT 39.6 10/14/2017   MCV 96.5 10/14/2017   PLT 145 10/14/2017      Chemistry      Component Value Date/Time   NA 141 10/14/2017 0941   NA 141 07/15/2017 0949   K 4.1 10/14/2017  0941   K 4.4 07/15/2017 0949   CL 103 10/14/2017 0941   CO2 30 (H) 10/14/2017 0941   CO2 27 07/15/2017 0949   BUN 25 10/14/2017 0941   BUN 18.0 07/15/2017 0949   CREATININE 1.07 10/14/2017 0941   CREATININE 1.1 07/15/2017 0949      Component Value Date/Time   CALCIUM 9.6 10/14/2017 0941   CALCIUM 9.2 07/15/2017 0949   ALKPHOS 56 10/14/2017 0941   ALKPHOS 56 07/15/2017 0949   AST 19 10/14/2017 0941   AST 20 07/15/2017 0949   ALT 15 10/14/2017 0941   ALT 16 07/15/2017 0949   BILITOT 0.4 10/14/2017 0941   BILITOT 0.61 07/15/2017 0949       RADIOGRAPHIC STUDIES: No results found.  ASSESSMENT AND PLAN:  This is a very pleasant 84 years old white male with history of multiple myeloma diagnosed in November 2015 status post several chemotherapy regimens as well as radiation and he is currently on observation. The patient is feeling fine with no specific  complaints except for the gait imbalance secondary to his previous history of dropfoot. His myeloma panel showed no concerning findings for disease progression.  I discussed the lab results with the patient and his wife and recommended for him to continue in observation for now with repeat myeloma panel in 3 months. For the bone disease, the patient will continue his current treatment with Zometa every 3 months. He was advised to call immediately if he has any concerning symptoms in the interval. The patient voices understanding of current disease status and treatment options and is in agreement with the current care plan.  All questions were answered. The patient knows to call the clinic with any problems, questions or concerns. We can certainly see the patient much sooner if necessary. I spent 10 minutes counseling the patient face to face. The total time spent in the appointment was 15 minutes.  Disclaimer: This note was dictated with voice recognition software. Similar sounding words can inadvertently be transcribed and may not be  corrected upon review.

## 2017-10-21 NOTE — Patient Instructions (Signed)

## 2017-10-22 DIAGNOSIS — R269 Unspecified abnormalities of gait and mobility: Secondary | ICD-10-CM | POA: Diagnosis not present

## 2017-10-22 DIAGNOSIS — E039 Hypothyroidism, unspecified: Secondary | ICD-10-CM | POA: Diagnosis not present

## 2017-10-22 DIAGNOSIS — I6340 Cerebral infarction due to embolism of unspecified cerebral artery: Secondary | ICD-10-CM | POA: Diagnosis not present

## 2017-10-22 DIAGNOSIS — C61 Malignant neoplasm of prostate: Secondary | ICD-10-CM | POA: Diagnosis not present

## 2017-10-22 DIAGNOSIS — D696 Thrombocytopenia, unspecified: Secondary | ICD-10-CM | POA: Diagnosis not present

## 2017-10-22 DIAGNOSIS — K219 Gastro-esophageal reflux disease without esophagitis: Secondary | ICD-10-CM | POA: Diagnosis not present

## 2017-10-22 DIAGNOSIS — C9000 Multiple myeloma not having achieved remission: Secondary | ICD-10-CM | POA: Diagnosis not present

## 2017-10-22 DIAGNOSIS — C439 Malignant melanoma of skin, unspecified: Secondary | ICD-10-CM | POA: Diagnosis not present

## 2017-12-03 DIAGNOSIS — Z85828 Personal history of other malignant neoplasm of skin: Secondary | ICD-10-CM | POA: Diagnosis not present

## 2017-12-03 DIAGNOSIS — L570 Actinic keratosis: Secondary | ICD-10-CM | POA: Diagnosis not present

## 2017-12-03 DIAGNOSIS — L738 Other specified follicular disorders: Secondary | ICD-10-CM | POA: Diagnosis not present

## 2018-01-13 ENCOUNTER — Inpatient Hospital Stay: Payer: Medicare Other | Attending: Internal Medicine

## 2018-01-13 DIAGNOSIS — C9000 Multiple myeloma not having achieved remission: Secondary | ICD-10-CM | POA: Insufficient documentation

## 2018-01-13 DIAGNOSIS — R531 Weakness: Secondary | ICD-10-CM | POA: Diagnosis not present

## 2018-01-13 DIAGNOSIS — R5383 Other fatigue: Secondary | ICD-10-CM | POA: Insufficient documentation

## 2018-01-13 LAB — CBC WITH DIFFERENTIAL (CANCER CENTER ONLY)
BASOS PCT: 0 %
Basophils Absolute: 0 10*3/uL (ref 0.0–0.1)
Eosinophils Absolute: 0.1 10*3/uL (ref 0.0–0.5)
Eosinophils Relative: 2 %
HEMATOCRIT: 40 % (ref 38.4–49.9)
HEMOGLOBIN: 13.2 g/dL (ref 13.0–17.1)
LYMPHS PCT: 17 %
Lymphs Abs: 0.9 10*3/uL (ref 0.9–3.3)
MCH: 31.8 pg (ref 27.2–33.4)
MCHC: 32.9 g/dL (ref 32.0–36.0)
MCV: 96.5 fL (ref 79.3–98.0)
MONOS PCT: 8 %
Monocytes Absolute: 0.4 10*3/uL (ref 0.1–0.9)
NEUTROS ABS: 3.7 10*3/uL (ref 1.5–6.5)
NEUTROS PCT: 73 %
Platelet Count: 132 10*3/uL — ABNORMAL LOW (ref 140–400)
RBC: 4.14 MIL/uL — ABNORMAL LOW (ref 4.20–5.82)
RDW: 14.1 % (ref 11.0–14.6)
WBC Count: 5.1 10*3/uL (ref 4.0–10.3)

## 2018-01-13 LAB — CMP (CANCER CENTER ONLY)
ALK PHOS: 55 U/L (ref 40–150)
ALT: 11 U/L (ref 0–55)
ANION GAP: 8 (ref 3–11)
AST: 17 U/L (ref 5–34)
Albumin: 4.2 g/dL (ref 3.5–5.0)
BUN: 27 mg/dL — ABNORMAL HIGH (ref 7–26)
CALCIUM: 9.7 mg/dL (ref 8.4–10.4)
CHLORIDE: 103 mmol/L (ref 98–109)
CO2: 30 mmol/L — AB (ref 22–29)
Creatinine: 1.06 mg/dL (ref 0.70–1.30)
GFR, Estimated: >60 mL/min (ref >60)
Glucose, Bld: 83 mg/dL (ref 70–140)
POTASSIUM: 4.2 mmol/L (ref 3.5–5.1)
SODIUM: 141 mmol/L (ref 136–145)
Total Bilirubin: 0.4 mg/dL (ref 0.2–1.2)
Total Protein: 6.5 g/dL (ref 6.4–8.3)

## 2018-01-13 LAB — LACTATE DEHYDROGENASE: LDH: 175 U/L (ref 125–245)

## 2018-01-14 LAB — IGG, IGA, IGM
IgA: 88 mg/dL (ref 61–437)
IgG (Immunoglobin G), Serum: 649 mg/dL — ABNORMAL LOW (ref 700–1600)
IgM (Immunoglobulin M), Srm: 57 mg/dL (ref 15–143)

## 2018-01-14 LAB — KAPPA/LAMBDA LIGHT CHAINS
KAPPA FREE LGHT CHN: 11.8 mg/L (ref 3.3–19.4)
Kappa, lambda light chain ratio: 1.04 (ref 0.26–1.65)
LAMDA FREE LIGHT CHAINS: 11.3 mg/L (ref 5.7–26.3)

## 2018-01-14 LAB — BETA 2 MICROGLOBULIN, SERUM: BETA 2 MICROGLOBULIN: 2 mg/L (ref 0.6–2.4)

## 2018-01-20 ENCOUNTER — Inpatient Hospital Stay: Payer: Medicare Other

## 2018-01-20 ENCOUNTER — Encounter: Payer: Self-pay | Admitting: Internal Medicine

## 2018-01-20 ENCOUNTER — Inpatient Hospital Stay (HOSPITAL_BASED_OUTPATIENT_CLINIC_OR_DEPARTMENT_OTHER): Payer: Medicare Other | Admitting: Internal Medicine

## 2018-01-20 VITALS — BP 131/70 | HR 83 | Temp 98.2°F | Resp 18 | Ht 69.0 in | Wt 155.7 lb

## 2018-01-20 DIAGNOSIS — R5383 Other fatigue: Secondary | ICD-10-CM

## 2018-01-20 DIAGNOSIS — R531 Weakness: Secondary | ICD-10-CM

## 2018-01-20 DIAGNOSIS — C9000 Multiple myeloma not having achieved remission: Secondary | ICD-10-CM

## 2018-01-20 DIAGNOSIS — C7951 Secondary malignant neoplasm of bone: Secondary | ICD-10-CM

## 2018-01-20 MED ORDER — ZOLEDRONIC ACID 4 MG/100ML IV SOLN
4.0000 mg | Freq: Once | INTRAVENOUS | Status: AC
  Administered 2018-01-20: 4 mg via INTRAVENOUS
  Filled 2018-01-20: qty 100

## 2018-01-20 NOTE — Progress Notes (Signed)
[place] [phone]   Fax:([phone]  OFFICE PROGRESS NOTE  Wenda Low, MD [address] [place] [address]  DIAGNOSIS: Multiple myeloma diagnosed in November 2015.  PRIOR THERAPY: 1) Status post Thoracic five laminectomy for epidural tumor resection, Thoracic 4-7 posterior lateral arthrodesis,  segmental pedicle screw fixation T4-T7 Globus instrumentation under the care of Dr. Christella Noa on 06/18/2014. 2) Systemic chemotherapy with Velcade 1.3 MG/M2 subcutaneously weekly in addition to Decadron 40 mg by mouth on weekly basis. Status post 11 cycles. 3) palliative radiotherapy to the right proximal humerus, shoulder and scapula under the care of Dr. Lisbeth Renshaw completed on 12/12/2014. 4)  Systemic chemotherapy with Velcade 1.3 MG/M2 subcutaneously weekly, Decadron 40 mg by mouth weekly in addition to Revlimid 25 MG by mouth daily for 21 days every 4 weeks, status post 14 cycles.   CURRENT THERAPY: Zometa 4 mg IV every 12 weeks. First dose was given 11/01/2014.  INTERVAL HISTORY: Jeremiah Owens 84 y.o. male returns to the clinic today for follow-up visit accompanied by his wife.  The patient is feeling fine today with no specific complaints except for generalized fatigue and weakness in his legs.  He denied having any current chest pain, shortness of breath, cough or hemoptysis.  He denied having any weight loss or night sweats.  He has no nausea, vomiting, diarrhea or constipation.  He has been on observation for close to 2 years now.  He had repeat myeloma panel performed recently and he is here for evaluation and discussion of his lab results.   MEDICAL HISTORY: Past Medical History:  Diagnosis Date  . Allergy   . Anxiety   . Bone cancer ([place])    t_spine T-5  . Compression fracture   . Encounter for antineoplastic chemotherapy 01/31/2015  . Hearing loss   . Melanoma ([place])   . Multiple myeloma ([place])   . Prostate cancer ([place])  2002  . S/P radiation therapy 08/21/14-09/04/14   T4-6 25Gy/67f  . S/P radiation therapy 11/29/14-12/12/14   rt prox humerus/shoulder/scapula 25Gy/128f . Skin cancer 1994   melanoma  right neck   . [place]  . Thyroid disease     ALLERGIES:  is allergic to iodine.  MEDICATIONS:  Current Outpatient Medications  Medication Sig Dispense Refill  . aspirin 81 MG EC tablet Take 81 mg by mouth daily.  0  . Calcium Carbonate-Vitamin D (CALTRATE 600+D PO) Take 600 mg by mouth 2 (two) times daily.    . cholecalciferol (VITAMIN D) 1000 UNITS tablet Take 1,000 Units by mouth at bedtime.     . diphenoxylate-atropine (LOMOTIL) 2.5-0.025 MG tablet Take 2 tablets by mouth 4 (four) times daily as needed for diarrhea or loose stools. (Patient not taking: Reported on 10/21/2017) 30 tablet 0  . furosemide (LASIX) 20 MG tablet Take 20 mg by mouth daily. Pt takes 1 tablet daily except on Mon and Thurs takes 2 tablets    . KLOR-CON 10 10 MEQ tablet Take 10 mEq by mouth daily.    . Marland Kitchenevothyroxine (SYNTHROID, LEVOTHROID) 112 MCG tablet TK 1 T PO QD IN THE MORNING OES  6  . methocarbamol (ROBAXIN) 500 MG tablet TAKE 1 TABLET EVERY 6 HOURS AS NEEDED FOR MUSCLE SPASMS 180 tablet 4  . Multiple Vitamins-Minerals (CENTRUM SILVER PO) Take 1 tablet by mouth daily.    . Marland Kitchenmeprazole (PRILOSEC) 40 MG capsule Take 40 mg by mouth daily as needed (heartburn).     .Marland Kitchen  ranitidine (ZANTAC) 150 MG tablet Take 150 mg by mouth daily as needed for heartburn.   11  . sennosides-docusate sodium (SENOKOT-S) 8.6-50 MG tablet Take 1-2 tablets by mouth 2 (two) times daily as needed for constipation. Reported on 01/23/2016    . tamsulosin (FLOMAX) 0.4 MG CAPS capsule Take 1 capsule (0.4 mg total) by mouth at bedtime. 30 capsule 3  . TEXACORT 2.5 % SOLN APP TO THE SCALP HS PRN  2   No current facility-administered medications for this visit.    Facility-Administered Medications Ordered in Other Visits  Medication Dose Route Frequency  Provider Last Rate Last Dose  . sodium chloride 0.9 % injection 10 mL  10 mL Intracatheter PRN Curt Bears, MD        SURGICAL HISTORY:  Past Surgical History:  Procedure Laterality Date  . HERNIA REPAIR    . POSTERIOR CERVICAL FUSION/FORAMINOTOMY N/A 06/17/2014   Procedure: Thoracic five laminectomy for epidural tumor resection Thoracic 4-7 posterior lateral arthrodesis, segmental pedicle screw fixation.;  Surgeon: Ashok Pall, MD;  Location: [facility];  Service: Neurosurgery;  Laterality: N/A;    REVIEW OF SYSTEMS:  A comprehensive review of systems was negative except for: Constitutional: positive for fatigue Musculoskeletal: positive for muscle weakness Neurological: positive for gait problems   PHYSICAL EXAMINATION: General appearance: alert, cooperative, fatigued and no distress Head: Normocephalic, without obvious abnormality, atraumatic Neck: no adenopathy, no JVD, supple, symmetrical, trachea midline and thyroid not enlarged, symmetric, no tenderness/mass/nodules Lymph nodes: Cervical, supraclavicular, and axillary nodes normal. Resp: clear to auscultation bilaterally Back: symmetric, no curvature. ROM normal. No CVA tenderness. Cardio: regular rate and rhythm, S1, S2 normal, no murmur, click, rub or gallop GI: soft, non-tender; bowel sounds normal; no masses,  no organomegaly Extremities: extremities normal, atraumatic, no cyanosis or edema  ECOG PERFORMANCE STATUS: 1 - Symptomatic but completely ambulatory  Blood pressure 131/70, pulse 83, temperature 98.2 F (36.8 C), temperature source Oral, resp. rate 18, height '5\' 9"'  (1.753 m), weight 155 lb 11.2 oz (70.6 kg), SpO2 100 %.  LABORATORY DATA: Lab Results  Component Value Date   WBC 5.1 01/13/2018   HGB 13.2 01/13/2018   HCT 40.0 01/13/2018   MCV 96.5 01/13/2018   PLT 132 (L) 01/13/2018      Chemistry      Component Value Date/Time   NA 141 01/13/2018 1012   NA 141 07/15/2017 0949   K 4.2 01/13/2018  1012   K 4.4 07/15/2017 0949   CL 103 01/13/2018 1012   CO2 30 (H) 01/13/2018 1012   CO2 27 07/15/2017 0949   BUN 27 (H) 01/13/2018 1012   BUN 18.0 07/15/2017 0949   CREATININE 1.06 01/13/2018 1012   CREATININE 1.1 07/15/2017 0949      Component Value Date/Time   CALCIUM 9.7 01/13/2018 1012   CALCIUM 9.2 07/15/2017 0949   ALKPHOS 55 01/13/2018 1012   ALKPHOS 56 07/15/2017 0949   AST 17 01/13/2018 1012   AST 20 07/15/2017 0949   ALT 11 01/13/2018 1012   ALT 16 07/15/2017 0949   BILITOT 0.4 01/13/2018 1012   BILITOT 0.61 07/15/2017 0949       RADIOGRAPHIC STUDIES: No results found.  ASSESSMENT AND PLAN:  This is a very pleasant 84 years old white male with history of multiple myeloma diagnosed in November 2015 status post several chemotherapy regimens as well as radiation and he is currently on observation for the last 2 years.Marland Kitchen He is feeling fine today except for the  generalized fatigue and muscle weakness. Repeat myeloma panel showed no concerning findings for disease progression. I recommended for the patient to continue on observation with repeat myeloma panel in 3 months. For the bone disease, the patient will continue his current treatment with Zometa every 3 months. He was advised to call immediately if he has any concerning symptoms in the interval. The patient voices understanding of current disease status and treatment options and is in agreement with the current care plan.  All questions were answered. The patient knows to call the clinic with any problems, questions or concerns. We can certainly see the patient much sooner if necessary. I spent 10 minutes counseling the patient face to face. The total time spent in the appointment was 15 minutes.  Disclaimer: This note was dictated with voice recognition software. Similar sounding words can inadvertently be transcribed and may not be corrected upon review.

## 2018-01-20 NOTE — Patient Instructions (Signed)

## 2018-03-30 ENCOUNTER — Other Ambulatory Visit: Payer: Self-pay | Admitting: Physical Medicine & Rehabilitation

## 2018-04-13 DIAGNOSIS — H02831 Dermatochalasis of right upper eyelid: Secondary | ICD-10-CM | POA: Diagnosis not present

## 2018-04-13 DIAGNOSIS — H02834 Dermatochalasis of left upper eyelid: Secondary | ICD-10-CM | POA: Diagnosis not present

## 2018-04-13 DIAGNOSIS — H04123 Dry eye syndrome of bilateral lacrimal glands: Secondary | ICD-10-CM | POA: Diagnosis not present

## 2018-04-13 DIAGNOSIS — Z961 Presence of intraocular lens: Secondary | ICD-10-CM | POA: Diagnosis not present

## 2018-04-14 ENCOUNTER — Inpatient Hospital Stay: Payer: Medicare Other | Attending: Internal Medicine

## 2018-04-14 DIAGNOSIS — Z79899 Other long term (current) drug therapy: Secondary | ICD-10-CM | POA: Diagnosis not present

## 2018-04-14 DIAGNOSIS — C9000 Multiple myeloma not having achieved remission: Secondary | ICD-10-CM | POA: Diagnosis not present

## 2018-04-14 DIAGNOSIS — Z8546 Personal history of malignant neoplasm of prostate: Secondary | ICD-10-CM | POA: Insufficient documentation

## 2018-04-14 DIAGNOSIS — Z8673 Personal history of transient ischemic attack (TIA), and cerebral infarction without residual deficits: Secondary | ICD-10-CM | POA: Diagnosis not present

## 2018-04-14 DIAGNOSIS — Z7982 Long term (current) use of aspirin: Secondary | ICD-10-CM | POA: Diagnosis not present

## 2018-04-14 DIAGNOSIS — E079 Disorder of thyroid, unspecified: Secondary | ICD-10-CM | POA: Diagnosis not present

## 2018-04-14 DIAGNOSIS — F419 Anxiety disorder, unspecified: Secondary | ICD-10-CM | POA: Diagnosis not present

## 2018-04-15 LAB — KAPPA/LAMBDA LIGHT CHAINS
Kappa free light chain: 10.6 mg/L (ref 3.3–19.4)
Kappa, lambda light chain ratio: 1.08 (ref 0.26–1.65)
LAMDA FREE LIGHT CHAINS: 9.8 mg/L (ref 5.7–26.3)

## 2018-04-15 LAB — BETA 2 MICROGLOBULIN, SERUM: Beta-2 Microglobulin: 2.4 mg/L (ref 0.6–2.4)

## 2018-04-15 LAB — IGG, IGA, IGM
IgA: 93 mg/dL (ref 61–437)
IgG (Immunoglobin G), Serum: 664 mg/dL — ABNORMAL LOW (ref 700–1600)
IgM (Immunoglobulin M), Srm: 51 mg/dL (ref 15–143)

## 2018-04-21 ENCOUNTER — Inpatient Hospital Stay: Payer: Medicare Other

## 2018-04-21 ENCOUNTER — Inpatient Hospital Stay (HOSPITAL_BASED_OUTPATIENT_CLINIC_OR_DEPARTMENT_OTHER): Payer: Medicare Other | Admitting: Internal Medicine

## 2018-04-21 ENCOUNTER — Encounter: Payer: Self-pay | Admitting: Internal Medicine

## 2018-04-21 ENCOUNTER — Telehealth: Payer: Self-pay | Admitting: Internal Medicine

## 2018-04-21 VITALS — BP 129/59 | HR 95 | Temp 97.9°F | Resp 18 | Ht 69.0 in | Wt 154.3 lb

## 2018-04-21 DIAGNOSIS — Z7982 Long term (current) use of aspirin: Secondary | ICD-10-CM | POA: Diagnosis not present

## 2018-04-21 DIAGNOSIS — Z79899 Other long term (current) drug therapy: Secondary | ICD-10-CM | POA: Diagnosis not present

## 2018-04-21 DIAGNOSIS — C9000 Multiple myeloma not having achieved remission: Secondary | ICD-10-CM | POA: Diagnosis not present

## 2018-04-21 DIAGNOSIS — Z9221 Personal history of antineoplastic chemotherapy: Secondary | ICD-10-CM

## 2018-04-21 DIAGNOSIS — E079 Disorder of thyroid, unspecified: Secondary | ICD-10-CM | POA: Diagnosis not present

## 2018-04-21 DIAGNOSIS — F419 Anxiety disorder, unspecified: Secondary | ICD-10-CM | POA: Diagnosis not present

## 2018-04-21 DIAGNOSIS — C419 Malignant neoplasm of bone and articular cartilage, unspecified: Secondary | ICD-10-CM

## 2018-04-21 DIAGNOSIS — Z8546 Personal history of malignant neoplasm of prostate: Secondary | ICD-10-CM | POA: Diagnosis not present

## 2018-04-21 DIAGNOSIS — C7951 Secondary malignant neoplasm of bone: Secondary | ICD-10-CM

## 2018-04-21 DIAGNOSIS — Z923 Personal history of irradiation: Secondary | ICD-10-CM | POA: Diagnosis not present

## 2018-04-21 LAB — CBC WITH DIFFERENTIAL (CANCER CENTER ONLY)
BASOS ABS: 0 10*3/uL (ref 0.0–0.1)
Basophils Relative: 0 %
EOS PCT: 1 %
Eosinophils Absolute: 0.1 10*3/uL (ref 0.0–0.5)
HCT: 37.9 % — ABNORMAL LOW (ref 38.4–49.9)
Hemoglobin: 12.5 g/dL — ABNORMAL LOW (ref 13.0–17.1)
Lymphocytes Relative: 20 %
Lymphs Abs: 0.9 10*3/uL (ref 0.9–3.3)
MCH: 31.8 pg (ref 27.2–33.4)
MCHC: 33 g/dL (ref 32.0–36.0)
MCV: 96.4 fL (ref 79.3–98.0)
MONO ABS: 0.3 10*3/uL (ref 0.1–0.9)
Monocytes Relative: 7 %
Neutro Abs: 3.5 10*3/uL (ref 1.5–6.5)
Neutrophils Relative %: 72 %
PLATELETS: 12 10*3/uL — AB (ref 140–400)
RBC: 3.93 MIL/uL — AB (ref 4.20–5.82)
RDW: 14.1 % (ref 11.0–14.6)
WBC: 4.8 10*3/uL (ref 4.0–10.3)

## 2018-04-21 MED ORDER — ZOLEDRONIC ACID 4 MG/100ML IV SOLN
4.0000 mg | Freq: Once | INTRAVENOUS | Status: AC
  Administered 2018-04-21: 4 mg via INTRAVENOUS
  Filled 2018-04-21: qty 100

## 2018-04-21 NOTE — Patient Instructions (Signed)

## 2018-04-21 NOTE — Telephone Encounter (Signed)
Appts scheduled avs/calendar printed per 9/18 los °
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
## 2018-04-21 NOTE — Progress Notes (Signed)
Ok to give zometa today per MD [facility] with labs from June, pt to have labs drawn after infusion today.

## 2018-05-06 DIAGNOSIS — J069 Acute upper respiratory infection, unspecified: Secondary | ICD-10-CM | POA: Diagnosis not present

## 2018-05-06 DIAGNOSIS — R05 Cough: Secondary | ICD-10-CM | POA: Diagnosis not present

## 2018-05-09 DIAGNOSIS — R609 Edema, unspecified: Secondary | ICD-10-CM | POA: Diagnosis not present

## 2018-05-26 DIAGNOSIS — Z23 Encounter for immunization: Secondary | ICD-10-CM | POA: Diagnosis not present

## 2018-06-07 DIAGNOSIS — C9000 Multiple myeloma not having achieved remission: Secondary | ICD-10-CM | POA: Diagnosis not present

## 2018-06-07 DIAGNOSIS — R7309 Other abnormal glucose: Secondary | ICD-10-CM | POA: Diagnosis not present

## 2018-06-07 DIAGNOSIS — J309 Allergic rhinitis, unspecified: Secondary | ICD-10-CM | POA: Diagnosis not present

## 2018-06-07 DIAGNOSIS — Z1389 Encounter for screening for other disorder: Secondary | ICD-10-CM | POA: Diagnosis not present

## 2018-06-07 DIAGNOSIS — R1310 Dysphagia, unspecified: Secondary | ICD-10-CM | POA: Diagnosis not present

## 2018-06-07 DIAGNOSIS — C439 Malignant melanoma of skin, unspecified: Secondary | ICD-10-CM | POA: Diagnosis not present

## 2018-06-07 DIAGNOSIS — I6340 Cerebral infarction due to embolism of unspecified cerebral artery: Secondary | ICD-10-CM | POA: Diagnosis not present

## 2018-06-07 DIAGNOSIS — Z0000 Encounter for general adult medical examination without abnormal findings: Secondary | ICD-10-CM | POA: Diagnosis not present

## 2018-06-07 DIAGNOSIS — D696 Thrombocytopenia, unspecified: Secondary | ICD-10-CM | POA: Diagnosis not present

## 2018-06-07 DIAGNOSIS — M6281 Muscle weakness (generalized): Secondary | ICD-10-CM | POA: Diagnosis not present

## 2018-06-07 DIAGNOSIS — C61 Malignant neoplasm of prostate: Secondary | ICD-10-CM | POA: Diagnosis not present

## 2018-06-07 DIAGNOSIS — E039 Hypothyroidism, unspecified: Secondary | ICD-10-CM | POA: Diagnosis not present

## 2018-06-21 DIAGNOSIS — M79641 Pain in right hand: Secondary | ICD-10-CM | POA: Diagnosis not present

## 2018-06-21 DIAGNOSIS — M25512 Pain in left shoulder: Secondary | ICD-10-CM | POA: Diagnosis not present

## 2018-06-22 DIAGNOSIS — L814 Other melanin hyperpigmentation: Secondary | ICD-10-CM | POA: Diagnosis not present

## 2018-06-22 DIAGNOSIS — L738 Other specified follicular disorders: Secondary | ICD-10-CM | POA: Diagnosis not present

## 2018-06-22 DIAGNOSIS — Z85828 Personal history of other malignant neoplasm of skin: Secondary | ICD-10-CM | POA: Diagnosis not present

## 2018-06-22 DIAGNOSIS — D2261 Melanocytic nevi of right upper limb, including shoulder: Secondary | ICD-10-CM | POA: Diagnosis not present

## 2018-06-22 DIAGNOSIS — L821 Other seborrheic keratosis: Secondary | ICD-10-CM | POA: Diagnosis not present

## 2018-06-22 DIAGNOSIS — L570 Actinic keratosis: Secondary | ICD-10-CM | POA: Diagnosis not present

## 2018-06-22 DIAGNOSIS — D225 Melanocytic nevi of trunk: Secondary | ICD-10-CM | POA: Diagnosis not present

## 2018-06-22 DIAGNOSIS — C44722 Squamous cell carcinoma of skin of right lower limb, including hip: Secondary | ICD-10-CM | POA: Diagnosis not present

## 2018-07-14 ENCOUNTER — Inpatient Hospital Stay: Payer: Medicare Other | Attending: Internal Medicine

## 2018-07-14 DIAGNOSIS — Z923 Personal history of irradiation: Secondary | ICD-10-CM | POA: Diagnosis not present

## 2018-07-14 DIAGNOSIS — Z9221 Personal history of antineoplastic chemotherapy: Secondary | ICD-10-CM | POA: Insufficient documentation

## 2018-07-14 DIAGNOSIS — C9000 Multiple myeloma not having achieved remission: Secondary | ICD-10-CM | POA: Insufficient documentation

## 2018-07-14 LAB — CBC WITH DIFFERENTIAL (CANCER CENTER ONLY)
Abs Immature Granulocytes: 0.01 10*3/uL (ref 0.00–0.07)
BASOS ABS: 0 10*3/uL (ref 0.0–0.1)
Basophils Relative: 0 %
Eosinophils Absolute: 0.1 10*3/uL (ref 0.0–0.5)
Eosinophils Relative: 1 %
HCT: 38.8 % — ABNORMAL LOW (ref 39.0–52.0)
Hemoglobin: 12.7 g/dL — ABNORMAL LOW (ref 13.0–17.0)
Immature Granulocytes: 0 %
Lymphocytes Relative: 13 %
Lymphs Abs: 0.8 10*3/uL (ref 0.7–4.0)
MCH: 31.7 pg (ref 26.0–34.0)
MCHC: 32.7 g/dL (ref 30.0–36.0)
MCV: 96.8 fL (ref 80.0–100.0)
Monocytes Absolute: 0.5 10*3/uL (ref 0.1–1.0)
Monocytes Relative: 10 %
Neutro Abs: 4.3 10*3/uL (ref 1.7–7.7)
Neutrophils Relative %: 76 %
Platelet Count: 153 10*3/uL (ref 150–400)
RBC: 4.01 MIL/uL — ABNORMAL LOW (ref 4.22–5.81)
RDW: 14.6 % (ref 11.5–15.5)
WBC Count: 5.7 10*3/uL (ref 4.0–10.5)
nRBC: 0 % (ref 0.0–0.2)

## 2018-07-14 LAB — CMP (CANCER CENTER ONLY)
ALT: 21 U/L (ref 0–44)
ANION GAP: 8 (ref 5–15)
AST: 23 U/L (ref 15–41)
Albumin: 4.2 g/dL (ref 3.5–5.0)
Alkaline Phosphatase: 58 U/L (ref 38–126)
BILIRUBIN TOTAL: 0.7 mg/dL (ref 0.3–1.2)
BUN: 26 mg/dL — ABNORMAL HIGH (ref 8–23)
CHLORIDE: 104 mmol/L (ref 98–111)
CO2: 30 mmol/L (ref 22–32)
Calcium: 9.6 mg/dL (ref 8.9–10.3)
Creatinine: 1.12 mg/dL (ref 0.61–1.24)
GFR, Estimated: 59 mL/min — ABNORMAL LOW (ref >60)
Glucose, Bld: 82 mg/dL (ref 70–99)
POTASSIUM: 4.1 mmol/L (ref 3.5–5.1)
SODIUM: 142 mmol/L (ref 135–145)
Total Protein: 6.7 g/dL (ref 6.5–8.1)

## 2018-07-14 LAB — LACTATE DEHYDROGENASE: LDH: 195 U/L — AB (ref 98–192)

## 2018-07-19 LAB — KAPPA/LAMBDA LIGHT CHAINS
KAPPA FREE LGHT CHN: 12.2 mg/L (ref 3.3–19.4)
KAPPA, LAMDA LIGHT CHAIN RATIO: 1.42 (ref 0.26–1.65)
LAMDA FREE LIGHT CHAINS: 8.6 mg/L (ref 5.7–26.3)

## 2018-07-19 LAB — IGG, IGA, IGM
IgA: 106 mg/dL (ref 61–437)
IgG (Immunoglobin G), Serum: 722 mg/dL (ref 700–1600)
IgM (Immunoglobulin M), Srm: 57 mg/dL (ref 15–143)

## 2018-07-19 LAB — BETA 2 MICROGLOBULIN, SERUM: Beta-2 Microglobulin: 2.3 mg/L (ref 0.6–2.4)

## 2018-07-21 ENCOUNTER — Encounter: Payer: Self-pay | Admitting: Internal Medicine

## 2018-07-21 ENCOUNTER — Inpatient Hospital Stay (HOSPITAL_BASED_OUTPATIENT_CLINIC_OR_DEPARTMENT_OTHER): Payer: Medicare Other | Admitting: Internal Medicine

## 2018-07-21 ENCOUNTER — Inpatient Hospital Stay: Payer: Medicare Other

## 2018-07-21 VITALS — BP 138/63 | HR 70 | Temp 98.1°F | Resp 16 | Ht 69.0 in | Wt 150.7 lb

## 2018-07-21 DIAGNOSIS — C9000 Multiple myeloma not having achieved remission: Secondary | ICD-10-CM

## 2018-07-21 DIAGNOSIS — C419 Malignant neoplasm of bone and articular cartilage, unspecified: Secondary | ICD-10-CM

## 2018-07-21 DIAGNOSIS — Z923 Personal history of irradiation: Secondary | ICD-10-CM

## 2018-07-21 DIAGNOSIS — C7951 Secondary malignant neoplasm of bone: Secondary | ICD-10-CM

## 2018-07-21 DIAGNOSIS — Z9221 Personal history of antineoplastic chemotherapy: Secondary | ICD-10-CM | POA: Diagnosis not present

## 2018-07-21 MED ORDER — ZOLEDRONIC ACID 4 MG/100ML IV SOLN
4.0000 mg | Freq: Once | INTRAVENOUS | Status: AC
  Administered 2018-07-21: 4 mg via INTRAVENOUS
  Filled 2018-07-21: qty 100

## 2018-07-21 NOTE — Progress Notes (Signed)
[place] Telephone:([phone]   Fax:(336) [phone]  OFFICE PROGRESS NOTE  Wenda Low, MD [address] [place] [address]  DIAGNOSIS: Multiple myeloma diagnosed in November 2015.  PRIOR THERAPY: 1) Status post Thoracic five laminectomy for epidural tumor resection, Thoracic 4-7 posterior lateral arthrodesis,  segmental pedicle screw fixation T4-T7 Globus instrumentation under the care of Dr. Christella Noa on 06/18/2014. 2) Systemic chemotherapy with Velcade 1.3 MG/M2 subcutaneously weekly in addition to Decadron 40 mg by mouth on weekly basis. Status post 11 cycles. 3) palliative radiotherapy to the right proximal humerus, shoulder and scapula under the care of Dr. Lisbeth Renshaw completed on 12/12/2014. 4)  Systemic chemotherapy with Velcade 1.3 MG/M2 subcutaneously weekly, Decadron 40 mg by mouth weekly in addition to Revlimid 25 MG by mouth daily for 21 days every 4 weeks, status post 14 cycles.   CURRENT THERAPY: Zometa 4 mg IV every 12 weeks. First dose was given 11/01/2014.  INTERVAL HISTORY: Jeremiah Owens 84 y.o. male returns to the clinic today for follow-up visit accompanied by his wife.  The patient is feeling fine today with no concerning complaints except for continuous balance problem.  He does physical exercise at least 3 times a week.  He denied having any chest pain, shortness breath, cough or hemoptysis.  He has no nausea, vomiting, diarrhea or constipation.  He continues to have mild pain in the left shoulder area.  The patient had repeat myeloma panel performed recently and he is here for evaluation and discussion of his lab results.  MEDICAL HISTORY: Past Medical History:  Diagnosis Date  . Allergy   . Anxiety   . Bone cancer ([place])    t_spine T-5  . Compression fracture   . Encounter for antineoplastic chemotherapy 01/31/2015  . Hearing loss   . Melanoma ([place])   . Multiple myeloma ([place])   . Prostate cancer ([place]) 2002  . S/P  radiation therapy 08/21/14-09/04/14   T4-6 25Gy/59f  . S/P radiation therapy 11/29/14-12/12/14   rt prox humerus/shoulder/scapula 25Gy/131f . Skin cancer 1994   melanoma  right neck   . [place]  . Thyroid disease     ALLERGIES:  is allergic to iodine.  MEDICATIONS:  Current Outpatient Medications  Medication Sig Dispense Refill  . aspirin 81 MG EC tablet Take 81 mg by mouth daily.  0  . Calcium Carbonate-Vitamin D (CALTRATE 600+D PO) Take 600 mg by mouth 2 (two) times daily.    . cholecalciferol (VITAMIN D) 1000 UNITS tablet Take 1,000 Units by mouth at bedtime.     . diphenoxylate-atropine (LOMOTIL) 2.5-0.025 MG tablet Take 2 tablets by mouth 4 (four) times daily as needed for diarrhea or loose stools. 30 tablet 0  . furosemide (LASIX) 20 MG tablet Take 20 mg by mouth daily. Pt takes 1 tablet daily except on Mon and Thurs takes 2 tablets    . KLOR-CON 10 10 MEQ tablet Take 10 mEq by mouth daily.    . Marland Kitchenevothyroxine (SYNTHROID, LEVOTHROID) 112 MCG tablet TK 1 T PO QD IN THE MORNING OES  6  . methocarbamol (ROBAXIN) 500 MG tablet TAKE 1 TABLET EVERY 6 HOURS AS NEEDED FOR MUSCLE SPASMS 180 tablet 4  . Multiple Vitamins-Minerals (CENTRUM SILVER PO) Take 1 tablet by mouth daily.    . Marland Kitchenmeprazole (PRILOSEC) 40 MG capsule Take 40 mg by mouth daily as needed (heartburn).     . sennosides-docusate sodium (SENOKOT-S) 8.6-50 MG tablet Take 1-2  tablets by mouth 2 (two) times daily as needed for constipation. Reported on 01/23/2016    . tamsulosin (FLOMAX) 0.4 MG CAPS capsule Take 1 capsule (0.4 mg total) by mouth at bedtime. 30 capsule 3  . TEXACORT 2.5 % SOLN APP TO THE SCALP HS PRN  2   No current facility-administered medications for this visit.    Facility-Administered Medications Ordered in Other Visits  Medication Dose Route Frequency Provider Last Rate Last Dose  . sodium chloride 0.9 % injection 10 mL  10 mL Intracatheter PRN Curt Bears, MD        SURGICAL HISTORY:  Past  Surgical History:  Procedure Laterality Date  . HERNIA REPAIR    . POSTERIOR CERVICAL FUSION/FORAMINOTOMY N/A 06/17/2014   Procedure: Thoracic five laminectomy for epidural tumor resection Thoracic 4-7 posterior lateral arthrodesis, segmental pedicle screw fixation.;  Surgeon: Ashok Pall, MD;  Location: [place] [facility];  Service: Neurosurgery;  Laterality: N/A;    REVIEW OF SYSTEMS:  A comprehensive review of systems was negative except for: Musculoskeletal: positive for arthralgias and muscle weakness   PHYSICAL EXAMINATION: General appearance: alert, cooperative, fatigued and no distress Head: Normocephalic, without obvious abnormality, atraumatic Neck: no adenopathy, no JVD, supple, symmetrical, trachea midline and thyroid not enlarged, symmetric, no tenderness/mass/nodules Lymph nodes: Cervical, supraclavicular, and axillary nodes normal. Resp: clear to auscultation bilaterally Back: symmetric, no curvature. ROM normal. No CVA tenderness. Cardio: regular rate and rhythm, S1, S2 normal, no murmur, click, rub or gallop GI: soft, non-tender; bowel sounds normal; no masses,  no organomegaly Extremities: extremities normal, atraumatic, no cyanosis or edema  ECOG PERFORMANCE STATUS: 1 - Symptomatic but completely ambulatory  Blood pressure 138/63, pulse 70, temperature 98.1 F (36.7 C), temperature source Oral, resp. rate 16, height 5' 9" (1.753 m), weight 150 lb 11.2 oz (68.4 kg), SpO2 100 %.  LABORATORY DATA: Lab Results  Component Value Date   WBC 5.7 07/14/2018   HGB 12.7 (L) 07/14/2018   HCT 38.8 (L) 07/14/2018   MCV 96.8 07/14/2018   PLT 153 07/14/2018      Chemistry      Component Value Date/Time   NA 142 07/14/2018 1050   NA 141 07/15/2017 0949   K 4.1 07/14/2018 1050   K 4.4 07/15/2017 0949   CL 104 07/14/2018 1050   CO2 30 07/14/2018 1050   CO2 27 07/15/2017 0949   BUN 26 (H) 07/14/2018 1050   BUN 18.0 07/15/2017 0949   CREATININE 1.12 07/14/2018 1050    CREATININE 1.1 07/15/2017 0949      Component Value Date/Time   CALCIUM 9.6 07/14/2018 1050   CALCIUM 9.2 07/15/2017 0949   ALKPHOS 58 07/14/2018 1050   ALKPHOS 56 07/15/2017 0949   AST 23 07/14/2018 1050   AST 20 07/15/2017 0949   ALT 21 07/14/2018 1050   ALT 16 07/15/2017 0949   BILITOT 0.7 07/14/2018 1050   BILITOT 0.61 07/15/2017 0949       RADIOGRAPHIC STUDIES: No results found.  ASSESSMENT AND PLAN:  This is a very pleasant 84 years old white male with history of multiple myeloma diagnosed in November 2015 status post several chemotherapy regimens as well as radiation. The patient is currently on observation and feeling fine. Repeat myeloma panel performed recently showed no concerning findings for progression. I recommended for the patient to continue on observation with repeat myeloma panel in 3 months. For the bone disease, he will receive Zometa infusion every 3 months. The patient was advised to call  immediately if he has any concerning symptoms in the interval. The patient voices understanding of current disease status and treatment options and is in agreement with the current care plan.  All questions were answered. The patient knows to call the clinic with any problems, questions or concerns. We can certainly see the patient much sooner if necessary. I spent 10 minutes counseling the patient face to face. The total time spent in the appointment was 15 minutes.  Disclaimer: This note was dictated with voice recognition software. Similar sounding words can inadvertently be transcribed and may not be corrected upon review.

## 2018-07-21 NOTE — Patient Instructions (Signed)

## 2018-09-03 DIAGNOSIS — Z85828 Personal history of other malignant neoplasm of skin: Secondary | ICD-10-CM | POA: Diagnosis not present

## 2018-09-03 DIAGNOSIS — C44722 Squamous cell carcinoma of skin of right lower limb, including hip: Secondary | ICD-10-CM | POA: Diagnosis not present

## 2018-09-03 DIAGNOSIS — L821 Other seborrheic keratosis: Secondary | ICD-10-CM | POA: Diagnosis not present

## 2018-10-13 ENCOUNTER — Inpatient Hospital Stay: Payer: Medicare Other | Attending: Internal Medicine

## 2018-10-13 ENCOUNTER — Other Ambulatory Visit: Payer: Self-pay

## 2018-10-13 DIAGNOSIS — Z8673 Personal history of transient ischemic attack (TIA), and cerebral infarction without residual deficits: Secondary | ICD-10-CM | POA: Diagnosis not present

## 2018-10-13 DIAGNOSIS — R269 Unspecified abnormalities of gait and mobility: Secondary | ICD-10-CM | POA: Insufficient documentation

## 2018-10-13 DIAGNOSIS — E079 Disorder of thyroid, unspecified: Secondary | ICD-10-CM | POA: Diagnosis not present

## 2018-10-13 DIAGNOSIS — C9000 Multiple myeloma not having achieved remission: Secondary | ICD-10-CM | POA: Diagnosis not present

## 2018-10-13 DIAGNOSIS — Z7982 Long term (current) use of aspirin: Secondary | ICD-10-CM | POA: Insufficient documentation

## 2018-10-13 LAB — CMP (CANCER CENTER ONLY)
ALBUMIN: 4 g/dL (ref 3.5–5.0)
ALT: 14 U/L (ref 0–44)
AST: 20 U/L (ref 15–41)
Alkaline Phosphatase: 59 U/L (ref 38–126)
Anion gap: 10 (ref 5–15)
BILIRUBIN TOTAL: 0.7 mg/dL (ref 0.3–1.2)
BUN: 24 mg/dL — ABNORMAL HIGH (ref 8–23)
CO2: 29 mmol/L (ref 22–32)
Calcium: 9.3 mg/dL (ref 8.9–10.3)
Chloride: 101 mmol/L (ref 98–111)
Creatinine: 1.13 mg/dL (ref 0.61–1.24)
GFR, Est AFR Am: >60 mL/min (ref >60)
GFR, Estimated: 59 mL/min — ABNORMAL LOW (ref >60)
Glucose, Bld: 76 mg/dL (ref 70–99)
Potassium: 4.3 mmol/L (ref 3.5–5.1)
Sodium: 140 mmol/L (ref 135–145)
Total Protein: 6.6 g/dL (ref 6.5–8.1)

## 2018-10-13 LAB — CBC WITH DIFFERENTIAL (CANCER CENTER ONLY)
Abs Immature Granulocytes: 0.02 10*3/uL (ref 0.00–0.07)
Basophils Absolute: 0 10*3/uL (ref 0.0–0.1)
Basophils Relative: 0 %
Eosinophils Absolute: 0.1 10*3/uL (ref 0.0–0.5)
Eosinophils Relative: 2 %
HEMATOCRIT: 39.2 % (ref 39.0–52.0)
Hemoglobin: 12.8 g/dL — ABNORMAL LOW (ref 13.0–17.0)
Immature Granulocytes: 0 %
Lymphocytes Relative: 15 %
Lymphs Abs: 0.8 10*3/uL (ref 0.7–4.0)
MCH: 31.6 pg (ref 26.0–34.0)
MCHC: 32.7 g/dL (ref 30.0–36.0)
MCV: 96.8 fL (ref 80.0–100.0)
Monocytes Absolute: 0.5 10*3/uL (ref 0.1–1.0)
Monocytes Relative: 9 %
Neutro Abs: 3.9 10*3/uL (ref 1.7–7.7)
Neutrophils Relative %: 74 %
Platelet Count: 147 10*3/uL — ABNORMAL LOW (ref 150–400)
RBC: 4.05 MIL/uL — ABNORMAL LOW (ref 4.22–5.81)
RDW: 13.5 % (ref 11.5–15.5)
WBC Count: 5.3 10*3/uL (ref 4.0–10.5)
nRBC: 0 % (ref 0.0–0.2)

## 2018-10-13 LAB — LACTATE DEHYDROGENASE: LDH: 220 U/L — ABNORMAL HIGH (ref 98–192)

## 2018-10-14 LAB — KAPPA/LAMBDA LIGHT CHAINS
Kappa free light chain: 14.4 mg/L (ref 3.3–19.4)
Kappa, lambda light chain ratio: 1.44 (ref 0.26–1.65)
Lambda free light chains: 10 mg/L (ref 5.7–26.3)

## 2018-10-14 LAB — IGG, IGA, IGM
IGA: 101 mg/dL (ref 61–437)
IgG (Immunoglobin G), Serum: 675 mg/dL — ABNORMAL LOW (ref 700–1600)
IgM (Immunoglobulin M), Srm: 48 mg/dL (ref 15–143)

## 2018-10-14 LAB — BETA 2 MICROGLOBULIN, SERUM: BETA 2 MICROGLOBULIN: 2.5 mg/L — AB (ref 0.6–2.4)

## 2018-10-20 ENCOUNTER — Inpatient Hospital Stay (HOSPITAL_BASED_OUTPATIENT_CLINIC_OR_DEPARTMENT_OTHER): Payer: Medicare Other | Admitting: Internal Medicine

## 2018-10-20 ENCOUNTER — Other Ambulatory Visit: Payer: Self-pay

## 2018-10-20 ENCOUNTER — Telehealth: Payer: Self-pay | Admitting: Internal Medicine

## 2018-10-20 ENCOUNTER — Inpatient Hospital Stay: Payer: Medicare Other

## 2018-10-20 ENCOUNTER — Encounter: Payer: Self-pay | Admitting: Internal Medicine

## 2018-10-20 VITALS — BP 132/67 | HR 77 | Temp 97.9°F | Resp 18 | Ht 69.0 in | Wt 153.0 lb

## 2018-10-20 DIAGNOSIS — Z8673 Personal history of transient ischemic attack (TIA), and cerebral infarction without residual deficits: Secondary | ICD-10-CM

## 2018-10-20 DIAGNOSIS — R269 Unspecified abnormalities of gait and mobility: Secondary | ICD-10-CM | POA: Diagnosis not present

## 2018-10-20 DIAGNOSIS — C7951 Secondary malignant neoplasm of bone: Secondary | ICD-10-CM

## 2018-10-20 DIAGNOSIS — C419 Malignant neoplasm of bone and articular cartilage, unspecified: Secondary | ICD-10-CM

## 2018-10-20 DIAGNOSIS — C9000 Multiple myeloma not having achieved remission: Secondary | ICD-10-CM | POA: Diagnosis not present

## 2018-10-20 DIAGNOSIS — Z7982 Long term (current) use of aspirin: Secondary | ICD-10-CM | POA: Diagnosis not present

## 2018-10-20 DIAGNOSIS — E079 Disorder of thyroid, unspecified: Secondary | ICD-10-CM | POA: Diagnosis not present

## 2018-10-20 MED ORDER — SODIUM CHLORIDE 0.9 % IV SOLN
INTRAVENOUS | Status: DC
  Administered 2018-10-20: 11:00:00 via INTRAVENOUS
  Filled 2018-10-20: qty 250

## 2018-10-20 MED ORDER — ZOLEDRONIC ACID 4 MG/100ML IV SOLN
4.0000 mg | Freq: Once | INTRAVENOUS | Status: AC
  Administered 2018-10-20: 4 mg via INTRAVENOUS
  Filled 2018-10-20: qty 100

## 2018-10-20 NOTE — Progress Notes (Signed)
[place] [phone]   [phone]  OFFICE PROGRESS NOTE  Wenda Low, MD [address] [place] [address]  DIAGNOSIS: Multiple myeloma diagnosed in November 2015.  PRIOR THERAPY: 1) Status post Thoracic five laminectomy for epidural tumor resection, Thoracic 4-7 posterior lateral arthrodesis,  segmental pedicle screw fixation T4-T7 Globus instrumentation under the care of Dr. Christella Noa on 06/18/2014. 2) Systemic chemotherapy with Velcade 1.3 MG/M2 subcutaneously weekly in addition to Decadron 40 mg by mouth on weekly basis. Status post 11 cycles. 3) palliative radiotherapy to the right proximal humerus, shoulder and scapula under the care of Dr. Lisbeth Renshaw completed on 12/12/2014. 4)  Systemic chemotherapy with Velcade 1.3 MG/M2 subcutaneously weekly, Decadron 40 mg by mouth weekly in addition to Revlimid 25 MG by mouth daily for 21 days every 4 weeks, status post 14 cycles.   CURRENT THERAPY: Zometa 4 mg IV every 12 weeks. First dose was given 11/01/2014.  INTERVAL HISTORY: Jeremiah Owens 85 y.o. male returns to the clinic today for 3 months follow-up visit accompanied by his wife.  The patient is feeling fine today with no concerning complaints except for the persistent balance problem secondary to weakness in his lower extremities.  He denied having any chest pain, shortness of breath, cough or hemoptysis.  He denied having any fever or chills.  He has no nausea, vomiting, diarrhea or constipation.  He has no headache or visual changes.  The patient had repeat myeloma panel performed recently and he is here for evaluation and discussion of his lab results.  MEDICAL HISTORY: Past Medical History:  Diagnosis Date  . Allergy   . Anxiety   . Bone cancer ([place])    t_spine T-5  . Compression fracture   . Encounter for antineoplastic chemotherapy 01/31/2015  . Hearing loss   . Melanoma ([place])   . Multiple myeloma ([place])   . Prostate  cancer ([place]) 2002  . S/P radiation therapy 08/21/14-09/04/14   T4-6 25Gy/11f  . S/P radiation therapy 11/29/14-12/12/14   rt prox humerus/shoulder/scapula 25Gy/14f . Skin cancer 1994   melanoma  right neck   . [place]  . Thyroid disease     ALLERGIES:  is allergic to iodine.  MEDICATIONS:  Current Outpatient Medications  Medication Sig Dispense Refill  . aspirin 81 MG EC tablet Take 81 mg by mouth daily.  0  . Calcium Carbonate-Vitamin D (CALTRATE 600+D PO) Take 600 mg by mouth 2 (two) times daily.    . cholecalciferol (VITAMIN D) 1000 UNITS tablet Take 1,000 Units by mouth at bedtime.     . diphenoxylate-atropine (LOMOTIL) 2.5-0.025 MG tablet Take 2 tablets by mouth 4 (four) times daily as needed for diarrhea or loose stools. 30 tablet 0  . furosemide (LASIX) 20 MG tablet Take 20 mg by mouth daily. Pt takes 1 tablet daily except on Mon and Thurs takes 2 tablets    . KLOR-CON 10 10 MEQ tablet Take 10 mEq by mouth daily.    . Marland Kitchenevothyroxine (SYNTHROID, LEVOTHROID) 112 MCG tablet TK 1 T PO QD IN THE MORNING OES  6  . methocarbamol (ROBAXIN) 500 MG tablet TAKE 1 TABLET EVERY 6 HOURS AS NEEDED FOR MUSCLE SPASMS 180 tablet 4  . Multiple Vitamins-Minerals (CENTRUM SILVER PO) Take 1 tablet by mouth daily.    . Marland Kitchenmeprazole (PRILOSEC) 40 MG capsule Take 40 mg by mouth daily as needed (heartburn).     . sennosides-docusate sodium (SENOKOT-S) 8.6-50  MG tablet Take 1-2 tablets by mouth 2 (two) times daily as needed for constipation. Reported on 01/23/2016    . tamsulosin (FLOMAX) 0.4 MG CAPS capsule Take 1 capsule (0.4 mg total) by mouth at bedtime. 30 capsule 3  . TEXACORT 2.5 % SOLN APP TO THE SCALP HS PRN  2   No current facility-administered medications for this visit.    Facility-Administered Medications Ordered in Other Visits  Medication Dose Route Frequency Provider Last Rate Last Dose  . sodium chloride 0.9 % injection 10 mL  10 mL Intracatheter PRN Curt Bears, MD         SURGICAL HISTORY:  Past Surgical History:  Procedure Laterality Date  . HERNIA REPAIR    . POSTERIOR CERVICAL FUSION/FORAMINOTOMY N/A 06/17/2014   Procedure: Thoracic five laminectomy for epidural tumor resection Thoracic 4-7 posterior lateral arthrodesis, segmental pedicle screw fixation.;  Surgeon: Ashok Pall, MD;  Location: [facility];  Service: Neurosurgery;  Laterality: N/A;    REVIEW OF SYSTEMS:  A comprehensive review of systems was negative except for: Musculoskeletal: positive for arthralgias and muscle weakness Neurological: positive for gait problems   PHYSICAL EXAMINATION: General appearance: alert, cooperative, fatigued and no distress Head: Normocephalic, without obvious abnormality, atraumatic Neck: no adenopathy, no JVD, supple, symmetrical, trachea midline and thyroid not enlarged, symmetric, no tenderness/mass/nodules Lymph nodes: Cervical, supraclavicular, and axillary nodes normal. Resp: clear to auscultation bilaterally Back: symmetric, no curvature. ROM normal. No CVA tenderness. Cardio: regular rate and rhythm, S1, S2 normal, no murmur, click, rub or gallop GI: soft, non-tender; bowel sounds normal; no masses,  no organomegaly Extremities: extremities normal, atraumatic, no cyanosis or edema  ECOG PERFORMANCE STATUS: 1 - Symptomatic but completely ambulatory  Blood pressure 132/67, pulse 77, temperature 97.9 F (36.6 C), temperature source Oral, resp. rate 18, height _0  (1.753 m), weight 153 lb (69.4 kg), SpO2 100 %.  LABORATORY DATA: Lab Results  Component Value Date   WBC 5.3 10/13/2018   HGB 12.8 (L) 10/13/2018   HCT 39.2 10/13/2018   MCV 96.8 10/13/2018   PLT 147 (L) 10/13/2018      Chemistry      Component Value Date/Time   NA 140 10/13/2018 1040   NA 141 07/15/2017 0949   K 4.3 10/13/2018 1040   K 4.4 07/15/2017 0949   CL 101 10/13/2018 1040   CO2 29 10/13/2018 1040   CO2 27 07/15/2017 0949   BUN 24 (H) 10/13/2018 1040   BUN 18.0  07/15/2017 0949   CREATININE 1.13 10/13/2018 1040   CREATININE 1.1 07/15/2017 0949      Component Value Date/Time   CALCIUM 9.3 10/13/2018 1040   CALCIUM 9.2 07/15/2017 0949   ALKPHOS 59 10/13/2018 1040   ALKPHOS 56 07/15/2017 0949   AST 20 10/13/2018 1040   AST 20 07/15/2017 0949   ALT 14 10/13/2018 1040   ALT 16 07/15/2017 0949   BILITOT 0.7 10/13/2018 1040   BILITOT 0.61 07/15/2017 0949       RADIOGRAPHIC STUDIES: No results found.  ASSESSMENT AND PLAN:  This is a very pleasant 85 years old white male with history of multiple myeloma diagnosed in November 2015 status post several chemotherapy regimens as well as radiation. He is currently on observation and doing fine. The patient had repeat myeloma panel performed recently.  I discussed the lab results with the patient and his wife. His myeloma panel showed no concerning findings for disease progression. I recommended for the patient to continue on observation  with repeat myeloma panel and 3 months. He will continue his treatment with Zometa every 3 months. The patient was advised to call immediately if he has any concerning symptoms in the interval. The patient voices understanding of current disease status and treatment options and is in agreement with the current care plan.  All questions were answered. The patient knows to call the clinic with any problems, questions or concerns. We can certainly see the patient much sooner if necessary. I spent 10 minutes counseling the patient face to face. The total time spent in the appointment was 15 minutes.  Disclaimer: This note was dictated with voice recognition software. Similar sounding words can inadvertently be transcribed and may not be corrected upon review.

## 2018-10-20 NOTE — Patient Instructions (Signed)

## 2018-10-20 NOTE — Telephone Encounter (Signed)
Gave avs and calendar ° °

## 2018-12-08 DIAGNOSIS — R269 Unspecified abnormalities of gait and mobility: Secondary | ICD-10-CM | POA: Diagnosis not present

## 2018-12-08 DIAGNOSIS — R350 Frequency of micturition: Secondary | ICD-10-CM | POA: Diagnosis not present

## 2018-12-08 DIAGNOSIS — C9000 Multiple myeloma not having achieved remission: Secondary | ICD-10-CM | POA: Diagnosis not present

## 2018-12-08 DIAGNOSIS — C61 Malignant neoplasm of prostate: Secondary | ICD-10-CM | POA: Diagnosis not present

## 2018-12-08 DIAGNOSIS — D696 Thrombocytopenia, unspecified: Secondary | ICD-10-CM | POA: Diagnosis not present

## 2018-12-08 DIAGNOSIS — E039 Hypothyroidism, unspecified: Secondary | ICD-10-CM | POA: Diagnosis not present

## 2018-12-08 DIAGNOSIS — C439 Malignant melanoma of skin, unspecified: Secondary | ICD-10-CM | POA: Diagnosis not present

## 2018-12-08 DIAGNOSIS — K219 Gastro-esophageal reflux disease without esophagitis: Secondary | ICD-10-CM | POA: Diagnosis not present

## 2018-12-08 DIAGNOSIS — M6281 Muscle weakness (generalized): Secondary | ICD-10-CM | POA: Diagnosis not present

## 2018-12-08 DIAGNOSIS — I6340 Cerebral infarction due to embolism of unspecified cerebral artery: Secondary | ICD-10-CM | POA: Diagnosis not present

## 2018-12-21 DIAGNOSIS — L821 Other seborrheic keratosis: Secondary | ICD-10-CM | POA: Diagnosis not present

## 2018-12-21 DIAGNOSIS — D0461 Carcinoma in situ of skin of right upper limb, including shoulder: Secondary | ICD-10-CM | POA: Diagnosis not present

## 2018-12-21 DIAGNOSIS — L570 Actinic keratosis: Secondary | ICD-10-CM | POA: Diagnosis not present

## 2018-12-21 DIAGNOSIS — Z85828 Personal history of other malignant neoplasm of skin: Secondary | ICD-10-CM | POA: Diagnosis not present

## 2019-01-12 ENCOUNTER — Inpatient Hospital Stay: Payer: Medicare Other | Attending: Internal Medicine

## 2019-01-12 ENCOUNTER — Other Ambulatory Visit: Payer: Self-pay

## 2019-01-12 DIAGNOSIS — M6281 Muscle weakness (generalized): Secondary | ICD-10-CM | POA: Insufficient documentation

## 2019-01-12 DIAGNOSIS — M2550 Pain in unspecified joint: Secondary | ICD-10-CM | POA: Diagnosis not present

## 2019-01-12 DIAGNOSIS — Z79899 Other long term (current) drug therapy: Secondary | ICD-10-CM | POA: Diagnosis not present

## 2019-01-12 DIAGNOSIS — Z8673 Personal history of transient ischemic attack (TIA), and cerebral infarction without residual deficits: Secondary | ICD-10-CM | POA: Diagnosis not present

## 2019-01-12 DIAGNOSIS — Z923 Personal history of irradiation: Secondary | ICD-10-CM | POA: Insufficient documentation

## 2019-01-12 DIAGNOSIS — Z888 Allergy status to other drugs, medicaments and biological substances status: Secondary | ICD-10-CM | POA: Diagnosis not present

## 2019-01-12 DIAGNOSIS — Z85830 Personal history of malignant neoplasm of bone: Secondary | ICD-10-CM | POA: Insufficient documentation

## 2019-01-12 DIAGNOSIS — C9000 Multiple myeloma not having achieved remission: Secondary | ICD-10-CM | POA: Insufficient documentation

## 2019-01-12 DIAGNOSIS — R5383 Other fatigue: Secondary | ICD-10-CM | POA: Insufficient documentation

## 2019-01-12 DIAGNOSIS — Z8546 Personal history of malignant neoplasm of prostate: Secondary | ICD-10-CM | POA: Diagnosis not present

## 2019-01-12 DIAGNOSIS — Z85820 Personal history of malignant melanoma of skin: Secondary | ICD-10-CM | POA: Diagnosis not present

## 2019-01-12 LAB — CMP (CANCER CENTER ONLY)
ALT: 16 U/L (ref 0–44)
AST: 21 U/L (ref 15–41)
Albumin: 4.1 g/dL (ref 3.5–5.0)
Alkaline Phosphatase: 56 U/L (ref 38–126)
Anion gap: 9 (ref 5–15)
BUN: 20 mg/dL (ref 8–23)
CO2: 29 mmol/L (ref 22–32)
Calcium: 9.3 mg/dL (ref 8.9–10.3)
Chloride: 102 mmol/L (ref 98–111)
Creatinine: 1.12 mg/dL (ref 0.61–1.24)
GFR, Est AFR Am: >60 mL/min (ref >60)
GFR, Estimated: 59 mL/min — ABNORMAL LOW (ref >60)
Glucose, Bld: 92 mg/dL (ref 70–99)
Potassium: 4.4 mmol/L (ref 3.5–5.1)
Sodium: 140 mmol/L (ref 135–145)
Total Bilirubin: 0.6 mg/dL (ref 0.3–1.2)
Total Protein: 6.6 g/dL (ref 6.5–8.1)

## 2019-01-12 LAB — CBC WITH DIFFERENTIAL (CANCER CENTER ONLY)
Abs Immature Granulocytes: 0.03 10*3/uL (ref 0.00–0.07)
Basophils Absolute: 0 10*3/uL (ref 0.0–0.1)
Basophils Relative: 0 %
Eosinophils Absolute: 0.1 10*3/uL (ref 0.0–0.5)
Eosinophils Relative: 2 %
HCT: 40.3 % (ref 39.0–52.0)
Hemoglobin: 13 g/dL (ref 13.0–17.0)
Immature Granulocytes: 1 %
Lymphocytes Relative: 16 %
Lymphs Abs: 0.9 10*3/uL (ref 0.7–4.0)
MCH: 31.4 pg (ref 26.0–34.0)
MCHC: 32.3 g/dL (ref 30.0–36.0)
MCV: 97.3 fL (ref 80.0–100.0)
Monocytes Absolute: 0.5 10*3/uL (ref 0.1–1.0)
Monocytes Relative: 9 %
Neutro Abs: 4 10*3/uL (ref 1.7–7.7)
Neutrophils Relative %: 72 %
Platelet Count: 153 10*3/uL (ref 150–400)
RBC: 4.14 MIL/uL — ABNORMAL LOW (ref 4.22–5.81)
RDW: 13.4 % (ref 11.5–15.5)
WBC Count: 5.6 10*3/uL (ref 4.0–10.5)
nRBC: 0 % (ref 0.0–0.2)

## 2019-01-12 LAB — LACTATE DEHYDROGENASE: LDH: 167 U/L (ref 98–192)

## 2019-01-13 LAB — IGG, IGA, IGM
IgA: 100 mg/dL (ref 61–437)
IgG (Immunoglobin G), Serum: 679 mg/dL (ref 603–1613)
IgM (Immunoglobulin M), Srm: 48 mg/dL (ref 15–143)

## 2019-01-13 LAB — KAPPA/LAMBDA LIGHT CHAINS
Kappa free light chain: 12.8 mg/L (ref 3.3–19.4)
Kappa, lambda light chain ratio: 1.66 — ABNORMAL HIGH (ref 0.26–1.65)
Lambda free light chains: 7.7 mg/L (ref 5.7–26.3)

## 2019-01-13 LAB — BETA 2 MICROGLOBULIN, SERUM: Beta-2 Microglobulin: 2.3 mg/L (ref 0.6–2.4)

## 2019-01-19 ENCOUNTER — Other Ambulatory Visit: Payer: Self-pay

## 2019-01-19 ENCOUNTER — Encounter: Payer: Self-pay | Admitting: Internal Medicine

## 2019-01-19 ENCOUNTER — Inpatient Hospital Stay: Payer: Medicare Other

## 2019-01-19 ENCOUNTER — Inpatient Hospital Stay (HOSPITAL_BASED_OUTPATIENT_CLINIC_OR_DEPARTMENT_OTHER): Payer: Medicare Other | Admitting: Internal Medicine

## 2019-01-19 VITALS — BP 119/69 | HR 80 | Temp 98.5°F | Resp 18 | Ht 69.0 in | Wt 150.9 lb

## 2019-01-19 DIAGNOSIS — Z79899 Other long term (current) drug therapy: Secondary | ICD-10-CM | POA: Diagnosis not present

## 2019-01-19 DIAGNOSIS — C9000 Multiple myeloma not having achieved remission: Secondary | ICD-10-CM

## 2019-01-19 DIAGNOSIS — Z923 Personal history of irradiation: Secondary | ICD-10-CM | POA: Diagnosis not present

## 2019-01-19 DIAGNOSIS — Z888 Allergy status to other drugs, medicaments and biological substances status: Secondary | ICD-10-CM

## 2019-01-19 DIAGNOSIS — Z8546 Personal history of malignant neoplasm of prostate: Secondary | ICD-10-CM

## 2019-01-19 DIAGNOSIS — R5383 Other fatigue: Secondary | ICD-10-CM

## 2019-01-19 DIAGNOSIS — M6281 Muscle weakness (generalized): Secondary | ICD-10-CM | POA: Diagnosis not present

## 2019-01-19 DIAGNOSIS — Z85820 Personal history of malignant melanoma of skin: Secondary | ICD-10-CM

## 2019-01-19 DIAGNOSIS — Z8673 Personal history of transient ischemic attack (TIA), and cerebral infarction without residual deficits: Secondary | ICD-10-CM

## 2019-01-19 DIAGNOSIS — M2550 Pain in unspecified joint: Secondary | ICD-10-CM | POA: Diagnosis not present

## 2019-01-19 DIAGNOSIS — C7951 Secondary malignant neoplasm of bone: Secondary | ICD-10-CM

## 2019-01-19 DIAGNOSIS — Z85830 Personal history of malignant neoplasm of bone: Secondary | ICD-10-CM | POA: Diagnosis not present

## 2019-01-19 MED ORDER — SODIUM CHLORIDE 0.9 % IV SOLN
Freq: Once | INTRAVENOUS | Status: AC
  Administered 2019-01-19: 11:00:00 via INTRAVENOUS
  Filled 2019-01-19: qty 250

## 2019-01-19 MED ORDER — ZOLEDRONIC ACID 4 MG/100ML IV SOLN
4.0000 mg | Freq: Once | INTRAVENOUS | Status: AC
  Administered 2019-01-19: 4 mg via INTRAVENOUS
  Filled 2019-01-19: qty 100

## 2019-01-19 NOTE — Progress Notes (Signed)
[place] [phone]   [phone]  OFFICE PROGRESS NOTE  Wenda Low, MD [address] [place] [address]  DIAGNOSIS: Multiple myeloma diagnosed in November 2015.  PRIOR THERAPY: 1) Status post Thoracic five laminectomy for epidural tumor resection, Thoracic 4-7 posterior lateral arthrodesis,  segmental pedicle screw fixation T4-T7 Globus instrumentation under the care of Dr. Christella Noa on 06/18/2014. 2) Systemic chemotherapy with Velcade 1.3 MG/M2 subcutaneously weekly in addition to Decadron 40 mg by mouth on weekly basis. Status post 11 cycles. 3) palliative radiotherapy to the right proximal humerus, shoulder and scapula under the care of Dr. Lisbeth Renshaw completed on 12/12/2014. 4)  Systemic chemotherapy with Velcade 1.3 MG/M2 subcutaneously weekly, Decadron 40 mg by mouth weekly in addition to Revlimid 25 MG by mouth daily for 21 days every 4 weeks, status post 14 cycles.   CURRENT THERAPY: Zometa 4 mg IV every 12 weeks. First dose was given 11/01/2014.  INTERVAL HISTORY: Raj Landress 85 y.o. male returns to the clinic today for 3 months follow-up visit.  The patient is feeling fine today with no concerning complaints except for mild fatigue.  He denied having any chest pain, shortness of breath, cough or hemoptysis.  He denied having any fever or chills.  He has no nausea, vomiting, diarrhea or constipation.  He denied having any headache or visual changes.  He is here today for evaluation with repeat myeloma panel and also for the Zometa infusion.   MEDICAL HISTORY: Past Medical History:  Diagnosis Date  . Allergy   . Anxiety   . Bone cancer ([place])    t_spine T-5  . Compression fracture   . Encounter for antineoplastic chemotherapy 01/31/2015  . Hearing loss   . Melanoma ([place])   . Multiple myeloma ([place])   . Prostate cancer ([place]) 2002  . S/P radiation therapy 08/21/14-09/04/14   T4-6 25Gy/75f  . S/P radiation therapy  11/29/14-12/12/14   rt prox humerus/shoulder/scapula 25Gy/128f . Skin cancer 1994   melanoma  right neck   . [place] ([place]  . Thyroid disease     ALLERGIES:  is allergic to iodine.  MEDICATIONS:  Current Outpatient Medications  Medication Sig Dispense Refill  . aspirin 81 MG EC tablet Take 81 mg by mouth daily.  0  . Calcium Carbonate-Vitamin D (CALTRATE 600+D PO) Take 600 mg by mouth 2 (two) times daily.    . cholecalciferol (VITAMIN D) 1000 UNITS tablet Take 1,000 Units by mouth at bedtime.     . diphenoxylate-atropine (LOMOTIL) 2.5-0.025 MG tablet Take 2 tablets by mouth 4 (four) times daily as needed for diarrhea or loose stools. 30 tablet 0  . furosemide (LASIX) 20 MG tablet Take 20 mg by mouth daily. Pt takes 1 tablet daily except on Mon and Thurs takes 2 tablets    . KLOR-CON 10 10 MEQ tablet Take 10 mEq by mouth daily.    . Marland Kitchenevothyroxine (SYNTHROID, LEVOTHROID) 112 MCG tablet TK 1 T PO QD IN THE MORNING OES  6  . methocarbamol (ROBAXIN) 500 MG tablet TAKE 1 TABLET EVERY 6 HOURS AS NEEDED FOR MUSCLE SPASMS 180 tablet 4  . Multiple Vitamins-Minerals (CENTRUM SILVER PO) Take 1 tablet by mouth daily.    . Marland Kitchenmeprazole (PRILOSEC) 40 MG capsule Take 40 mg by mouth daily as needed (heartburn).     . sennosides-docusate sodium (SENOKOT-S) 8.6-50 MG tablet Take 1-2 tablets by mouth 2 (two) times daily as needed for constipation.  Reported on 01/23/2016    . tamsulosin (FLOMAX) 0.4 MG CAPS capsule Take 1 capsule (0.4 mg total) by mouth at bedtime. 30 capsule 3  . TEXACORT 2.5 % SOLN APP TO THE SCALP HS PRN  2   No current facility-administered medications for this visit.    Facility-Administered Medications Ordered in Other Visits  Medication Dose Route Frequency Provider Last Rate Last Dose  . sodium chloride 0.9 % injection 10 mL  10 mL Intracatheter PRN Curt Bears, MD        SURGICAL HISTORY:  Past Surgical History:  Procedure Laterality Date  . HERNIA REPAIR    . POSTERIOR  CERVICAL FUSION/FORAMINOTOMY N/A 06/17/2014   Procedure: Thoracic five laminectomy for epidural tumor resection Thoracic 4-7 posterior lateral arthrodesis, segmental pedicle screw fixation.;  Surgeon: Ashok Pall, MD;  Location: [facility];  Service: Neurosurgery;  Laterality: N/A;    REVIEW OF SYSTEMS:  A comprehensive review of systems was negative except for: Constitutional: positive for fatigue Musculoskeletal: positive for arthralgias and muscle weakness Neurological: positive for gait problems   PHYSICAL EXAMINATION: General appearance: alert, cooperative, fatigued and no distress Head: Normocephalic, without obvious abnormality, atraumatic Neck: no adenopathy, no JVD, supple, symmetrical, trachea midline and thyroid not enlarged, symmetric, no tenderness/mass/nodules Lymph nodes: Cervical, supraclavicular, and axillary nodes normal. Resp: clear to auscultation bilaterally Back: symmetric, no curvature. ROM normal. No CVA tenderness. Cardio: regular rate and rhythm, S1, S2 normal, no murmur, click, rub or gallop GI: soft, non-tender; bowel sounds normal; no masses,  no organomegaly Extremities: extremities normal, atraumatic, no cyanosis or edema  ECOG PERFORMANCE STATUS: 1 - Symptomatic but completely ambulatory  Blood pressure 119/69, pulse 80, temperature 98.5 F (36.9 C), temperature source Oral, resp. rate 18, height '5\' 9"'  (1.753 m), weight 150 lb 14.4 oz (68.4 kg), SpO2 100 %.  LABORATORY DATA: Lab Results  Component Value Date   WBC 5.6 01/12/2019   HGB 13.0 01/12/2019   HCT 40.3 01/12/2019   MCV 97.3 01/12/2019   PLT 153 01/12/2019      Chemistry      Component Value Date/Time   NA 140 01/12/2019 1026   NA 141 07/15/2017 0949   K 4.4 01/12/2019 1026   K 4.4 07/15/2017 0949   CL 102 01/12/2019 1026   CO2 29 01/12/2019 1026   CO2 27 07/15/2017 0949   BUN 20 01/12/2019 1026   BUN 18.0 07/15/2017 0949   CREATININE 1.12 01/12/2019 1026   CREATININE 1.1  07/15/2017 0949      Component Value Date/Time   CALCIUM 9.3 01/12/2019 1026   CALCIUM 9.2 07/15/2017 0949   ALKPHOS 56 01/12/2019 1026   ALKPHOS 56 07/15/2017 0949   AST 21 01/12/2019 1026   AST 20 07/15/2017 0949   ALT 16 01/12/2019 1026   ALT 16 07/15/2017 0949   BILITOT 0.6 01/12/2019 1026   BILITOT 0.61 07/15/2017 0949       RADIOGRAPHIC STUDIES: No results found.  ASSESSMENT AND PLAN:  This is a very pleasant 85 years old white male with history of multiple myeloma diagnosed in November 2015 status post several chemotherapy regimens as well as radiation. The patient is currently on observation and he is doing fine with no concerning complaints. He has a repeat myeloma panel performed recently.  I discussed the lab results with the patient.  His myeloma panel showed no concerning findings for progression. I recommended for him to continue on observation with repeat myeloma panel in 3 months. The patient  will continue with his current treatment with Zometa every 3 months. He was advised to call immediately if he has any concerning symptoms in evaluation. The patient voices understanding of current disease status and treatment options and is in agreement with the current care plan.  All questions were answered. The patient knows to call the clinic with any problems, questions or concerns. We can certainly see the patient much sooner if necessary. I spent 10 minutes counseling the patient face to face. The total time spent in the appointment was 15 minutes.  Disclaimer: This note was dictated with voice recognition software. Similar sounding words can inadvertently be transcribed and may not be corrected upon review.

## 2019-01-19 NOTE — Patient Instructions (Addendum)

## 2019-01-21 ENCOUNTER — Telehealth: Payer: Self-pay | Admitting: Internal Medicine

## 2019-01-21 NOTE — Telephone Encounter (Signed)
appt already scheduled

## 2019-04-20 ENCOUNTER — Inpatient Hospital Stay: Payer: Medicare Other | Attending: Internal Medicine

## 2019-04-20 ENCOUNTER — Other Ambulatory Visit: Payer: Self-pay

## 2019-04-20 DIAGNOSIS — K219 Gastro-esophageal reflux disease without esophagitis: Secondary | ICD-10-CM | POA: Diagnosis not present

## 2019-04-20 DIAGNOSIS — R531 Weakness: Secondary | ICD-10-CM | POA: Diagnosis not present

## 2019-04-20 DIAGNOSIS — R05 Cough: Secondary | ICD-10-CM | POA: Diagnosis not present

## 2019-04-20 DIAGNOSIS — Z7982 Long term (current) use of aspirin: Secondary | ICD-10-CM | POA: Insufficient documentation

## 2019-04-20 DIAGNOSIS — Z23 Encounter for immunization: Secondary | ICD-10-CM | POA: Insufficient documentation

## 2019-04-20 DIAGNOSIS — C9000 Multiple myeloma not having achieved remission: Secondary | ICD-10-CM | POA: Diagnosis not present

## 2019-04-20 DIAGNOSIS — Z8546 Personal history of malignant neoplasm of prostate: Secondary | ICD-10-CM | POA: Diagnosis not present

## 2019-04-20 DIAGNOSIS — Z79899 Other long term (current) drug therapy: Secondary | ICD-10-CM | POA: Insufficient documentation

## 2019-04-20 LAB — CMP (CANCER CENTER ONLY)
ALT: 13 U/L (ref 0–44)
AST: 20 U/L (ref 15–41)
Albumin: 4.3 g/dL (ref 3.5–5.0)
Alkaline Phosphatase: 72 U/L (ref 38–126)
Anion gap: 8 (ref 5–15)
BUN: 20 mg/dL (ref 8–23)
CO2: 32 mmol/L (ref 22–32)
Calcium: 9.7 mg/dL (ref 8.9–10.3)
Chloride: 102 mmol/L (ref 98–111)
Creatinine: 1.07 mg/dL (ref 0.61–1.24)
GFR, Est AFR Am: >60 mL/min (ref >60)
GFR, Estimated: >60 mL/min (ref >60)
Glucose, Bld: 96 mg/dL (ref 70–99)
Potassium: 4.4 mmol/L (ref 3.5–5.1)
Sodium: 142 mmol/L (ref 135–145)
Total Bilirubin: 0.6 mg/dL (ref 0.3–1.2)
Total Protein: 6.8 g/dL (ref 6.5–8.1)

## 2019-04-20 LAB — CBC WITH DIFFERENTIAL (CANCER CENTER ONLY)
Abs Immature Granulocytes: 0.01 10*3/uL (ref 0.00–0.07)
Basophils Absolute: 0 10*3/uL (ref 0.0–0.1)
Basophils Relative: 0 %
Eosinophils Absolute: 0.1 10*3/uL (ref 0.0–0.5)
Eosinophils Relative: 3 %
HCT: 41.1 % (ref 39.0–52.0)
Hemoglobin: 13.5 g/dL (ref 13.0–17.0)
Immature Granulocytes: 0 %
Lymphocytes Relative: 15 %
Lymphs Abs: 0.7 10*3/uL (ref 0.7–4.0)
MCH: 31.9 pg (ref 26.0–34.0)
MCHC: 32.8 g/dL (ref 30.0–36.0)
MCV: 97.2 fL (ref 80.0–100.0)
Monocytes Absolute: 0.4 10*3/uL (ref 0.1–1.0)
Monocytes Relative: 9 %
Neutro Abs: 3.4 10*3/uL (ref 1.7–7.7)
Neutrophils Relative %: 73 %
Platelet Count: 147 10*3/uL — ABNORMAL LOW (ref 150–400)
RBC: 4.23 MIL/uL (ref 4.22–5.81)
RDW: 13.8 % (ref 11.5–15.5)
WBC Count: 4.6 10*3/uL (ref 4.0–10.5)
nRBC: 0 % (ref 0.0–0.2)

## 2019-04-20 LAB — LACTATE DEHYDROGENASE: LDH: 171 U/L (ref 98–192)

## 2019-04-21 LAB — IGG, IGA, IGM
IgA: 113 mg/dL (ref 61–437)
IgG (Immunoglobin G), Serum: 767 mg/dL (ref 603–1613)
IgM (Immunoglobulin M), Srm: 56 mg/dL (ref 15–143)

## 2019-04-21 LAB — KAPPA/LAMBDA LIGHT CHAINS
Kappa free light chain: 14.3 mg/L (ref 3.3–19.4)
Kappa, lambda light chain ratio: 1.19 (ref 0.26–1.65)
Lambda free light chains: 12 mg/L (ref 5.7–26.3)

## 2019-04-21 LAB — BETA 2 MICROGLOBULIN, SERUM: Beta-2 Microglobulin: 2.7 mg/L — ABNORMAL HIGH (ref 0.6–2.4)

## 2019-04-27 ENCOUNTER — Encounter: Payer: Self-pay | Admitting: Internal Medicine

## 2019-04-27 ENCOUNTER — Other Ambulatory Visit: Payer: Self-pay

## 2019-04-27 ENCOUNTER — Inpatient Hospital Stay: Payer: Medicare Other

## 2019-04-27 ENCOUNTER — Ambulatory Visit: Payer: Medicare Other | Admitting: Internal Medicine

## 2019-04-27 ENCOUNTER — Ambulatory Visit: Payer: Medicare Other

## 2019-04-27 ENCOUNTER — Inpatient Hospital Stay (HOSPITAL_BASED_OUTPATIENT_CLINIC_OR_DEPARTMENT_OTHER): Payer: Medicare Other | Admitting: Internal Medicine

## 2019-04-27 VITALS — BP 126/71 | HR 97 | Temp 98.7°F | Resp 18 | Ht 69.0 in | Wt 153.8 lb

## 2019-04-27 DIAGNOSIS — C419 Malignant neoplasm of bone and articular cartilage, unspecified: Secondary | ICD-10-CM | POA: Diagnosis not present

## 2019-04-27 DIAGNOSIS — C9000 Multiple myeloma not having achieved remission: Secondary | ICD-10-CM

## 2019-04-27 DIAGNOSIS — C7951 Secondary malignant neoplasm of bone: Secondary | ICD-10-CM

## 2019-04-27 DIAGNOSIS — Z23 Encounter for immunization: Secondary | ICD-10-CM

## 2019-04-27 DIAGNOSIS — R05 Cough: Secondary | ICD-10-CM | POA: Diagnosis not present

## 2019-04-27 DIAGNOSIS — K219 Gastro-esophageal reflux disease without esophagitis: Secondary | ICD-10-CM | POA: Diagnosis not present

## 2019-04-27 DIAGNOSIS — R531 Weakness: Secondary | ICD-10-CM | POA: Diagnosis not present

## 2019-04-27 DIAGNOSIS — Z7982 Long term (current) use of aspirin: Secondary | ICD-10-CM | POA: Diagnosis not present

## 2019-04-27 MED ORDER — SODIUM CHLORIDE 0.9 % IV SOLN
Freq: Once | INTRAVENOUS | Status: AC
  Administered 2019-04-27: 11:00:00 via INTRAVENOUS
  Filled 2019-04-27: qty 250

## 2019-04-27 MED ORDER — INFLUENZA VAC A&B SA ADJ QUAD 0.5 ML IM PRSY
0.5000 mL | PREFILLED_SYRINGE | Freq: Once | INTRAMUSCULAR | Status: AC
  Administered 2019-04-27: 12:00:00 0.5 mL via INTRAMUSCULAR

## 2019-04-27 MED ORDER — ZOLEDRONIC ACID 4 MG/100ML IV SOLN
4.0000 mg | Freq: Once | INTRAVENOUS | Status: AC
  Administered 2019-04-27: 4 mg via INTRAVENOUS
  Filled 2019-04-27: qty 100

## 2019-04-27 MED ORDER — INFLUENZA VAC A&B SA ADJ QUAD 0.5 ML IM PRSY
PREFILLED_SYRINGE | INTRAMUSCULAR | Status: AC
  Filled 2019-04-27: qty 0.5

## 2019-04-27 NOTE — Addendum Note (Signed)
Addended by: Lucile Crater on: 04/27/2019 10:28 AM   Modules accepted: Orders

## 2019-04-27 NOTE — Progress Notes (Signed)
[place] [phone]   [phone]) [phone]  OFFICE PROGRESS NOTE  Wenda Low, MD [address] [place] [address]  DIAGNOSIS: Multiple myeloma diagnosed in November 2015.  PRIOR THERAPY: 1) Status post Thoracic five laminectomy for epidural tumor resection, Thoracic 4-7 posterior lateral arthrodesis,  segmental pedicle screw fixation T4-T7 Globus instrumentation under the care of Dr. Christella Noa on 06/18/2014. 2) Systemic chemotherapy with Velcade 1.3 MG/M2 subcutaneously weekly in addition to Decadron 40 mg by mouth on weekly basis. Status post 11 cycles. 3) palliative radiotherapy to the right proximal humerus, shoulder and scapula under the care of Dr. Lisbeth Renshaw completed on 12/12/2014. 4)  Systemic chemotherapy with Velcade 1.3 MG/M2 subcutaneously weekly, Decadron 40 mg by mouth weekly in addition to Revlimid 25 MG by mouth daily for 21 days every 4 weeks, status post 14 cycles.   CURRENT THERAPY: Zometa 4 mg IV every 12 weeks. First dose was given 11/01/2014.  INTERVAL HISTORY: Jeremiah Owens 85 y.o. male returns to the clinic today for follow-up visit.  The patient is feeling fine today with no concerning complaints except for the pain and weakness in the feet bilaterally.  He has this problem for several years now.  He also has some mild cough.  He denied having any chest pain, shortness of breath or hemoptysis.  He denied having any recent weight loss or night sweats.  He has no nausea, vomiting, diarrhea or constipation.  He has no headache or visual changes.  He is here today for evaluation with repeat myeloma panel.  MEDICAL HISTORY: Past Medical History:  Diagnosis Date  . Allergy   . Anxiety   . Bone cancer ([place])    t_spine T-5  . Compression fracture   . Encounter for antineoplastic chemotherapy 01/31/2015  . Hearing loss   . Melanoma ([place])   . Multiple myeloma ([place])   . Prostate cancer ([place]) 2002  . S/P radiation  therapy 08/21/14-09/04/14   T4-6 25Gy/70f  . S/P radiation therapy 11/29/14-12/12/14   rt prox humerus/shoulder/scapula 25Gy/129f . Skin cancer 1994   melanoma  right neck   . Stroke ([place]  . Thyroid disease     ALLERGIES:  is allergic to iodine.  MEDICATIONS:  Current Outpatient Medications  Medication Sig Dispense Refill  . aspirin 81 MG EC tablet Take 81 mg by mouth daily.  0  . Calcium Carbonate-Vitamin D (CALTRATE 600+D PO) Take 600 mg by mouth 2 (two) times daily.    . cholecalciferol (VITAMIN D) 1000 UNITS tablet Take 1,000 Units by mouth at bedtime.     . diphenoxylate-atropine (LOMOTIL) 2.5-0.025 MG tablet Take 2 tablets by mouth 4 (four) times daily as needed for diarrhea or loose stools. 30 tablet 0  . furosemide (LASIX) 20 MG tablet Take 20 mg by mouth daily. Pt takes 1 tablet daily except on Mon and Thurs takes 2 tablets    . KLOR-CON 10 10 MEQ tablet Take 10 mEq by mouth daily.    . Marland Kitchenevothyroxine (SYNTHROID, LEVOTHROID) 112 MCG tablet TK 1 T PO QD IN THE MORNING OES  6  . methocarbamol (ROBAXIN) 500 MG tablet TAKE 1 TABLET EVERY 6 HOURS AS NEEDED FOR MUSCLE SPASMS 180 tablet 4  . Multiple Vitamins-Minerals (CENTRUM SILVER PO) Take 1 tablet by mouth daily.    . Marland Kitchenmeprazole (PRILOSEC) 40 MG capsule Take 40 mg by mouth daily as needed (heartburn).     . sennosides-docusate sodium (SENOKOT-S) 8.6-50 MG  tablet Take 1-2 tablets by mouth 2 (two) times daily as needed for constipation. Reported on 01/23/2016    . tamsulosin (FLOMAX) 0.4 MG CAPS capsule Take 1 capsule (0.4 mg total) by mouth at bedtime. 30 capsule 3  . TEXACORT 2.5 % SOLN APP TO THE SCALP HS PRN  2   No current facility-administered medications for this visit.    Facility-Administered Medications Ordered in Other Visits  Medication Dose Route Frequency Provider Last Rate Last Dose  . sodium chloride 0.9 % injection 10 mL  10 mL Intracatheter PRN Curt Bears, MD        SURGICAL HISTORY:  Past Surgical  History:  Procedure Laterality Date  . HERNIA REPAIR    . POSTERIOR CERVICAL FUSION/FORAMINOTOMY N/A 06/17/2014   Procedure: Thoracic five laminectomy for epidural tumor resection Thoracic 4-7 posterior lateral arthrodesis, segmental pedicle screw fixation.;  Surgeon: Ashok Pall, MD;  Location: [facility];  Service: Neurosurgery;  Laterality: N/A;    REVIEW OF SYSTEMS:  A comprehensive review of systems was negative except for: Constitutional: positive for fatigue Respiratory: positive for cough Musculoskeletal: positive for arthralgias and muscle weakness Neurological: positive for gait problems   PHYSICAL EXAMINATION: General appearance: alert, cooperative, fatigued and no distress Head: Normocephalic, without obvious abnormality, atraumatic Neck: no adenopathy, no JVD, supple, symmetrical, trachea midline and thyroid not enlarged, symmetric, no tenderness/mass/nodules Lymph nodes: Cervical, supraclavicular, and axillary nodes normal. Resp: clear to auscultation bilaterally Back: symmetric, no curvature. ROM normal. No CVA tenderness. Cardio: regular rate and rhythm, S1, S2 normal, no murmur, click, rub or gallop GI: soft, non-tender; bowel sounds normal; no masses,  no organomegaly Extremities: extremities normal, atraumatic, no cyanosis or edema  ECOG PERFORMANCE STATUS: 1 - Symptomatic but completely ambulatory  Blood pressure 126/71, pulse 97, temperature 98.7 F (37.1 C), temperature source Oral, resp. rate 18, height '5\' 9"'  (1.753 m), weight 153 lb 12.8 oz (69.8 kg), SpO2 100 %.  LABORATORY DATA: Lab Results  Component Value Date   WBC 4.6 04/20/2019   HGB 13.5 04/20/2019   HCT 41.1 04/20/2019   MCV 97.2 04/20/2019   PLT 147 (L) 04/20/2019      Chemistry      Component Value Date/Time   NA 142 04/20/2019 1029   NA 141 07/15/2017 0949   K 4.4 04/20/2019 1029   K 4.4 07/15/2017 0949   CL 102 04/20/2019 1029   CO2 32 04/20/2019 1029   CO2 27 07/15/2017 0949    BUN 20 04/20/2019 1029   BUN 18.0 07/15/2017 0949   CREATININE 1.07 04/20/2019 1029   CREATININE 1.1 07/15/2017 0949      Component Value Date/Time   CALCIUM 9.7 04/20/2019 1029   CALCIUM 9.2 07/15/2017 0949   ALKPHOS 72 04/20/2019 1029   ALKPHOS 56 07/15/2017 0949   AST 20 04/20/2019 1029   AST 20 07/15/2017 0949   ALT 13 04/20/2019 1029   ALT 16 07/15/2017 0949   BILITOT 0.6 04/20/2019 1029   BILITOT 0.61 07/15/2017 0949       RADIOGRAPHIC STUDIES: No results found.  ASSESSMENT AND PLAN:  This is a very pleasant 85 years old white male with history of multiple myeloma diagnosed in November 2015 status post several chemotherapy regimens as well as radiation. He has been in observation now for several years. The patient had repeat myeloma panel performed recently.  I personally discussed the results with the patient today.  His myeloma panel showed no concerning findings for progression or recurrence. I  recommended for the patient to continue on observation with repeat myeloma panel in 3 months. For the bone disease, he will receive Zometa infusion today. The patient was advised to call immediately if he has any other concerning symptoms in the interval. The patient voices understanding of current disease status and treatment options and is in agreement with the current care plan.  All questions were answered. The patient knows to call the clinic with any problems, questions or concerns. We can certainly see the patient much sooner if necessary. I spent 10 minutes counseling the patient face to face. The total time spent in the appointment was 15 minutes.  Disclaimer: This note was dictated with voice recognition software. Similar sounding words can inadvertently be transcribed and may not be corrected upon review.

## 2019-04-27 NOTE — Patient Instructions (Addendum)
Zoledronic Acid injection (Hypercalcemia, Oncology) What is this medicine? ZOLEDRONIC ACID (ZOE le dron ik AS id) lowers the amount of calcium loss from bone. It is used to treat too much calcium in your blood from cancer. It is also used to prevent complications of cancer that has spread to the bone. This medicine may be used for other purposes; ask your health care provider or pharmacist if you have questions. COMMON BRAND NAME(S): Zometa What should I tell my health care provider before I take this medicine? They need to know if you have any of these conditions:  aspirin-sensitive asthma  cancer, especially if you are receiving medicines used to treat cancer  dental disease or wear dentures  infection  kidney disease  receiving corticosteroids like dexamethasone or prednisone  an unusual or allergic reaction to zoledronic acid, other medicines, foods, dyes, or preservatives  pregnant or trying to get pregnant  breast-feeding How should I use this medicine? This medicine is for infusion into a vein. It is given by a health care professional in a hospital or clinic setting. Talk to your pediatrician regarding the use of this medicine in children. Special care may be needed. Overdosage: If you think you have taken too much of this medicine contact a poison control center or emergency room at once. NOTE: This medicine is only for you. Do not share this medicine with others. What if I miss a dose? It is important not to miss your dose. Call your doctor or health care professional if you are unable to keep an appointment. What may interact with this medicine?  certain antibiotics given by injection  NSAIDs, medicines for pain and inflammation, like ibuprofen or naproxen  some diuretics like bumetanide, furosemide  teriparatide  thalidomide This list may not describe all possible interactions. Give your health care provider a list of all the medicines, herbs, non-prescription  drugs, or dietary supplements you use. Also tell them if you smoke, drink alcohol, or use illegal drugs. Some items may interact with your medicine. What should I watch for while using this medicine? Visit your doctor or health care professional for regular checkups. It may be some time before you see the benefit from this medicine. Do not stop taking your medicine unless your doctor tells you to. Your doctor may order blood tests or other tests to see how you are doing. Women should inform their doctor if they wish to become pregnant or think they might be pregnant. There is a potential for serious side effects to an unborn child. Talk to your health care professional or pharmacist for more information. You should make sure that you get enough calcium and vitamin D while you are taking this medicine. Discuss the foods you eat and the vitamins you take with your health care professional. Some people who take this medicine have severe bone, joint, and/or muscle pain. This medicine may also increase your risk for jaw problems or a broken thigh bone. Tell your doctor right away if you have severe pain in your jaw, bones, joints, or muscles. Tell your doctor if you have any pain that does not go away or that gets worse. Tell your dentist and dental surgeon that you are taking this medicine. You should not have major dental surgery while on this medicine. See your dentist to have a dental exam and fix any dental problems before starting this medicine. Take good care of your teeth while on this medicine. Make sure you see your dentist for regular follow-up   appointments. What side effects may I notice from receiving this medicine? Side effects that you should report to your doctor or health care professional as soon as possible:  allergic reactions like skin rash, itching or hives, swelling of the face, lips, or tongue  anxiety, confusion, or depression  breathing problems  changes in vision  eye  pain  feeling faint or lightheaded, falls  jaw pain, especially after dental work  mouth sores  muscle cramps, stiffness, or weakness  redness, blistering, peeling or loosening of the skin, including inside the mouth  trouble passing urine or change in the amount of urine Side effects that usually do not require medical attention (report to your doctor or health care professional if they continue or are bothersome):  bone, joint, or muscle pain  constipation  diarrhea  fever  hair loss  irritation at site where injected  loss of appetite  nausea, vomiting  stomach upset  trouble sleeping  trouble swallowing  weak or tired This list may not describe all possible side effects. Call your doctor for medical advice about side effects. You may report side effects to FDA at 1-800-FDA-1088. Where should I keep my medicine? This drug is given in a hospital or clinic and will not be stored at home. NOTE: This sheet is a summary. It may not cover all possible information. If you have questions about this medicine, talk to your doctor, pharmacist, or health care provider.  2020 Elsevier/Gold Standard (2013-12-17 14:19:39)  Influenza Virus Vaccine injection What is this medicine? INFLUENZA VIRUS VACCINE (in floo EN zuh VAHY ruhs vak SEEN) helps to reduce the risk of getting influenza also known as the flu. The vaccine only helps protect you against some strains of the flu. This medicine may be used for other purposes; ask your health care provider or pharmacist if you have questions. COMMON BRAND NAME(S): Afluria, Afluria Quadrivalent, Agriflu, Alfuria, FLUAD, Fluarix, Fluarix Quadrivalent, Flublok, Flublok Quadrivalent, FLUCELVAX, Flulaval, Fluvirin, Fluzone, Fluzone High-Dose, Fluzone Intradermal What should I tell my health care provider before I take this medicine? They need to know if you have any of these conditions:  bleeding disorder like hemophilia  fever or  infection  Guillain-Barre syndrome or other neurological problems  immune system problems  infection with the human immunodeficiency virus (HIV) or AIDS  low blood platelet counts  multiple sclerosis  an unusual or allergic reaction to influenza virus vaccine, latex, other medicines, foods, dyes, or preservatives. Different brands of vaccines contain different allergens. Some may contain latex or eggs. Talk to your doctor about your allergies to make sure that you get the right vaccine.  pregnant or trying to get pregnant  breast-feeding How should I use this medicine? This vaccine is for injection into a muscle or under the skin. It is given by a health care professional. A copy of Vaccine Information Statements will be given before each vaccination. Read this sheet carefully each time. The sheet may change frequently. Talk to your healthcare provider to see which vaccines are right for you. Some vaccines should not be used in all age groups. Overdosage: If you think you have taken too much of this medicine contact a poison control center or emergency room at once. NOTE: This medicine is only for you. Do not share this medicine with others. What if I miss a dose? This does not apply. What may interact with this medicine?  chemotherapy or radiation therapy  medicines that lower your immune system like etanercept, anakinra, infliximab, and  adalimumab  medicines that treat or prevent blood clots like warfarin  phenytoin  steroid medicines like prednisone or cortisone  theophylline  vaccines This list may not describe all possible interactions. Give your health care provider a list of all the medicines, herbs, non-prescription drugs, or dietary supplements you use. Also tell them if you smoke, drink alcohol, or use illegal drugs. Some items may interact with your medicine. What should I watch for while using this medicine? Report any side effects that do not go away within 3  days to your doctor or health care professional. Call your health care provider if any unusual symptoms occur within 6 weeks of receiving this vaccine. You may still catch the flu, but the illness is not usually as bad. You cannot get the flu from the vaccine. The vaccine will not protect against colds or other illnesses that may cause fever. The vaccine is needed every year. What side effects may I notice from receiving this medicine? Side effects that you should report to your doctor or health care professional as soon as possible:  allergic reactions like skin rash, itching or hives, swelling of the face, lips, or tongue Side effects that usually do not require medical attention (report to your doctor or health care professional if they continue or are bothersome):  fever  headache  muscle aches and pains  pain, tenderness, redness, or swelling at the injection site  tiredness This list may not describe all possible side effects. Call your doctor for medical advice about side effects. You may report side effects to FDA at 1-800-FDA-1088. Where should I keep my medicine? The vaccine will be given by a health care professional in a clinic, pharmacy, doctor's office, or other health care setting. You will not be given vaccine doses to store at home. NOTE: This sheet is a summary. It may not cover all possible information. If you have questions about this medicine, talk to your doctor, pharmacist, or health care provider.  2020 Elsevier/Gold Standard (2018-06-15 08:45:43)

## 2019-04-28 ENCOUNTER — Telehealth: Payer: Self-pay | Admitting: Internal Medicine

## 2019-04-28 NOTE — Telephone Encounter (Signed)
Scheduled appt per 9/23 los - unable to reach pt . Left message with appt date and time

## 2019-06-27 DIAGNOSIS — L821 Other seborrheic keratosis: Secondary | ICD-10-CM | POA: Diagnosis not present

## 2019-06-27 DIAGNOSIS — D0461 Carcinoma in situ of skin of right upper limb, including shoulder: Secondary | ICD-10-CM | POA: Diagnosis not present

## 2019-06-27 DIAGNOSIS — L570 Actinic keratosis: Secondary | ICD-10-CM | POA: Diagnosis not present

## 2019-06-27 DIAGNOSIS — Z85828 Personal history of other malignant neoplasm of skin: Secondary | ICD-10-CM | POA: Diagnosis not present

## 2019-06-27 DIAGNOSIS — C4322 Malignant melanoma of left ear and external auricular canal: Secondary | ICD-10-CM | POA: Diagnosis not present

## 2019-06-28 DIAGNOSIS — R269 Unspecified abnormalities of gait and mobility: Secondary | ICD-10-CM | POA: Diagnosis not present

## 2019-06-28 DIAGNOSIS — I6340 Cerebral infarction due to embolism of unspecified cerebral artery: Secondary | ICD-10-CM | POA: Diagnosis not present

## 2019-06-28 DIAGNOSIS — C9000 Multiple myeloma not having achieved remission: Secondary | ICD-10-CM | POA: Diagnosis not present

## 2019-06-28 DIAGNOSIS — D696 Thrombocytopenia, unspecified: Secondary | ICD-10-CM | POA: Diagnosis not present

## 2019-06-28 DIAGNOSIS — Z23 Encounter for immunization: Secondary | ICD-10-CM | POA: Diagnosis not present

## 2019-06-28 DIAGNOSIS — Z1389 Encounter for screening for other disorder: Secondary | ICD-10-CM | POA: Diagnosis not present

## 2019-06-28 DIAGNOSIS — C439 Malignant melanoma of skin, unspecified: Secondary | ICD-10-CM | POA: Diagnosis not present

## 2019-06-28 DIAGNOSIS — M6281 Muscle weakness (generalized): Secondary | ICD-10-CM | POA: Diagnosis not present

## 2019-06-28 DIAGNOSIS — K219 Gastro-esophageal reflux disease without esophagitis: Secondary | ICD-10-CM | POA: Diagnosis not present

## 2019-06-28 DIAGNOSIS — Z8546 Personal history of malignant neoplasm of prostate: Secondary | ICD-10-CM | POA: Diagnosis not present

## 2019-06-28 DIAGNOSIS — R011 Cardiac murmur, unspecified: Secondary | ICD-10-CM | POA: Diagnosis not present

## 2019-06-28 DIAGNOSIS — Z0000 Encounter for general adult medical examination without abnormal findings: Secondary | ICD-10-CM | POA: Diagnosis not present

## 2019-06-28 DIAGNOSIS — H9190 Unspecified hearing loss, unspecified ear: Secondary | ICD-10-CM | POA: Diagnosis not present

## 2019-06-28 DIAGNOSIS — E039 Hypothyroidism, unspecified: Secondary | ICD-10-CM | POA: Diagnosis not present

## 2019-06-29 ENCOUNTER — Other Ambulatory Visit (HOSPITAL_COMMUNITY): Payer: Self-pay | Admitting: Internal Medicine

## 2019-06-29 DIAGNOSIS — R011 Cardiac murmur, unspecified: Secondary | ICD-10-CM

## 2019-07-08 ENCOUNTER — Other Ambulatory Visit: Payer: Self-pay

## 2019-07-08 ENCOUNTER — Ambulatory Visit (HOSPITAL_COMMUNITY): Payer: Medicare Other | Attending: Cardiology

## 2019-07-08 DIAGNOSIS — R011 Cardiac murmur, unspecified: Secondary | ICD-10-CM | POA: Insufficient documentation

## 2019-07-20 ENCOUNTER — Inpatient Hospital Stay: Payer: Medicare Other | Attending: Internal Medicine

## 2019-07-20 ENCOUNTER — Other Ambulatory Visit: Payer: Self-pay

## 2019-07-20 DIAGNOSIS — R5383 Other fatigue: Secondary | ICD-10-CM | POA: Diagnosis not present

## 2019-07-20 DIAGNOSIS — G629 Polyneuropathy, unspecified: Secondary | ICD-10-CM | POA: Diagnosis not present

## 2019-07-20 DIAGNOSIS — C9000 Multiple myeloma not having achieved remission: Secondary | ICD-10-CM | POA: Insufficient documentation

## 2019-07-20 DIAGNOSIS — Z79899 Other long term (current) drug therapy: Secondary | ICD-10-CM | POA: Insufficient documentation

## 2019-07-20 DIAGNOSIS — C7951 Secondary malignant neoplasm of bone: Secondary | ICD-10-CM | POA: Diagnosis present

## 2019-07-20 DIAGNOSIS — Z8546 Personal history of malignant neoplasm of prostate: Secondary | ICD-10-CM | POA: Insufficient documentation

## 2019-07-20 DIAGNOSIS — R2689 Other abnormalities of gait and mobility: Secondary | ICD-10-CM | POA: Diagnosis not present

## 2019-07-20 DIAGNOSIS — Z85828 Personal history of other malignant neoplasm of skin: Secondary | ICD-10-CM | POA: Diagnosis not present

## 2019-07-20 DIAGNOSIS — Z923 Personal history of irradiation: Secondary | ICD-10-CM | POA: Diagnosis not present

## 2019-07-20 DIAGNOSIS — E079 Disorder of thyroid, unspecified: Secondary | ICD-10-CM | POA: Insufficient documentation

## 2019-07-20 DIAGNOSIS — Z8673 Personal history of transient ischemic attack (TIA), and cerebral infarction without residual deficits: Secondary | ICD-10-CM | POA: Diagnosis not present

## 2019-07-20 DIAGNOSIS — Z7982 Long term (current) use of aspirin: Secondary | ICD-10-CM | POA: Diagnosis not present

## 2019-07-20 DIAGNOSIS — Z9221 Personal history of antineoplastic chemotherapy: Secondary | ICD-10-CM | POA: Diagnosis not present

## 2019-07-20 DIAGNOSIS — K219 Gastro-esophageal reflux disease without esophagitis: Secondary | ICD-10-CM | POA: Diagnosis not present

## 2019-07-20 LAB — CBC WITH DIFFERENTIAL (CANCER CENTER ONLY)
Abs Immature Granulocytes: 0.01 10*3/uL (ref 0.00–0.07)
Basophils Absolute: 0 10*3/uL (ref 0.0–0.1)
Basophils Relative: 0 %
Eosinophils Absolute: 0.1 10*3/uL (ref 0.0–0.5)
Eosinophils Relative: 2 %
HCT: 40.5 % (ref 39.0–52.0)
Hemoglobin: 13.3 g/dL (ref 13.0–17.0)
Immature Granulocytes: 0 %
Lymphocytes Relative: 17 %
Lymphs Abs: 0.9 10*3/uL (ref 0.7–4.0)
MCH: 31.4 pg (ref 26.0–34.0)
MCHC: 32.8 g/dL (ref 30.0–36.0)
MCV: 95.7 fL (ref 80.0–100.0)
Monocytes Absolute: 0.5 10*3/uL (ref 0.1–1.0)
Monocytes Relative: 10 %
Neutro Abs: 3.8 10*3/uL (ref 1.7–7.7)
Neutrophils Relative %: 71 %
Platelet Count: 152 10*3/uL (ref 150–400)
RBC: 4.23 MIL/uL (ref 4.22–5.81)
RDW: 13.8 % (ref 11.5–15.5)
WBC Count: 5.4 10*3/uL (ref 4.0–10.5)
nRBC: 0 % (ref 0.0–0.2)

## 2019-07-20 LAB — CMP (CANCER CENTER ONLY)
ALT: 18 U/L (ref 0–44)
AST: 21 U/L (ref 15–41)
Albumin: 4.2 g/dL (ref 3.5–5.0)
Alkaline Phosphatase: 62 U/L (ref 38–126)
Anion gap: 10 (ref 5–15)
BUN: 26 mg/dL — ABNORMAL HIGH (ref 8–23)
CO2: 31 mmol/L (ref 22–32)
Calcium: 9.5 mg/dL (ref 8.9–10.3)
Chloride: 101 mmol/L (ref 98–111)
Creatinine: 1.15 mg/dL (ref 0.61–1.24)
GFR, Est AFR Am: >60 mL/min (ref >60)
GFR, Estimated: 57 mL/min — ABNORMAL LOW (ref >60)
Glucose, Bld: 104 mg/dL — ABNORMAL HIGH (ref 70–99)
Potassium: 4.5 mmol/L (ref 3.5–5.1)
Sodium: 142 mmol/L (ref 135–145)
Total Bilirubin: 0.6 mg/dL (ref 0.3–1.2)
Total Protein: 6.9 g/dL (ref 6.5–8.1)

## 2019-07-20 LAB — LACTATE DEHYDROGENASE: LDH: 177 U/L (ref 98–192)

## 2019-07-21 LAB — BETA 2 MICROGLOBULIN, SERUM: Beta-2 Microglobulin: 2.6 mg/L — ABNORMAL HIGH (ref 0.6–2.4)

## 2019-07-21 LAB — IGG, IGA, IGM
IgA: 130 mg/dL (ref 61–437)
IgG (Immunoglobin G), Serum: 835 mg/dL (ref 603–1613)
IgM (Immunoglobulin M), Srm: 73 mg/dL (ref 15–143)

## 2019-07-21 LAB — KAPPA/LAMBDA LIGHT CHAINS
Kappa free light chain: 18.8 mg/L (ref 3.3–19.4)
Kappa, lambda light chain ratio: 1.47 (ref 0.26–1.65)
Lambda free light chains: 12.8 mg/L (ref 5.7–26.3)

## 2019-07-27 ENCOUNTER — Inpatient Hospital Stay (HOSPITAL_BASED_OUTPATIENT_CLINIC_OR_DEPARTMENT_OTHER): Payer: Medicare Other | Admitting: Internal Medicine

## 2019-07-27 ENCOUNTER — Encounter: Payer: Self-pay | Admitting: Internal Medicine

## 2019-07-27 ENCOUNTER — Inpatient Hospital Stay: Payer: Medicare Other

## 2019-07-27 ENCOUNTER — Other Ambulatory Visit: Payer: Self-pay

## 2019-07-27 VITALS — BP 129/60 | HR 95 | Temp 97.8°F | Resp 18 | Ht 69.0 in | Wt 153.8 lb

## 2019-07-27 DIAGNOSIS — Z5111 Encounter for antineoplastic chemotherapy: Secondary | ICD-10-CM

## 2019-07-27 DIAGNOSIS — C7951 Secondary malignant neoplasm of bone: Secondary | ICD-10-CM

## 2019-07-27 DIAGNOSIS — C9000 Multiple myeloma not having achieved remission: Secondary | ICD-10-CM | POA: Diagnosis not present

## 2019-07-27 DIAGNOSIS — C419 Malignant neoplasm of bone and articular cartilage, unspecified: Secondary | ICD-10-CM | POA: Diagnosis not present

## 2019-07-27 MED ORDER — SODIUM CHLORIDE 0.9 % IV SOLN
INTRAVENOUS | Status: DC
  Filled 2019-07-27: qty 250

## 2019-07-27 MED ORDER — ZOLEDRONIC ACID 4 MG/100ML IV SOLN
4.0000 mg | Freq: Once | INTRAVENOUS | Status: AC
  Administered 2019-07-27: 4 mg via INTRAVENOUS

## 2019-07-27 MED ORDER — ZOLEDRONIC ACID 4 MG/100ML IV SOLN
INTRAVENOUS | Status: AC
  Filled 2019-07-27: qty 100

## 2019-07-27 NOTE — Progress Notes (Signed)
[facility] Telephone:([phone]) [phone]   [phone]  OFFICE PROGRESS NOTE  Wenda Low, MD [address] [place] [address]  DIAGNOSIS: Multiple myeloma diagnosed in November 2015.  PRIOR THERAPY: 1) Status post Thoracic five laminectomy for epidural tumor resection, Thoracic 4-7 posterior lateral arthrodesis,  segmental pedicle screw fixation T4-T7 Globus instrumentation under the care of Dr. Christella Noa on 06/18/2014. 2) Systemic chemotherapy with Velcade 1.3 MG/M2 subcutaneously weekly in addition to Decadron 40 mg by mouth on weekly basis. Status post 11 cycles. 3) palliative radiotherapy to the right proximal humerus, shoulder and scapula under the care of Dr. Lisbeth Renshaw completed on 12/12/2014. 4)  Systemic chemotherapy with Velcade 1.3 MG/M2 subcutaneously weekly, Decadron 40 mg by mouth weekly in addition to Revlimid 25 MG by mouth daily for 21 days every 4 weeks, status post 14 cycles.   CURRENT THERAPY: Zometa 4 mg IV every 12 weeks. First dose was given 11/01/2014.  INTERVAL HISTORY: Jeremiah Owens 85 y.o. male returns to the clinic today for follow-up visit.  The patient is feeling fine today with no concerning complaints except for the gait imbalance with the peripheral neuropathy.  He denied having any current chest pain, shortness of breath, cough or hemoptysis.  He denied having any recent weight loss or night sweats.  He has no nausea, vomiting, diarrhea or constipation.  He has no headache or visual changes.  He had repeat myeloma panel performed recently and he is here for evaluation and discussion of his lab results.  MEDICAL HISTORY: Past Medical History:  Diagnosis Date   Allergy    Anxiety    Bone cancer ([place])    t_spine T-5   Compression fracture    Encounter for antineoplastic chemotherapy 01/31/2015   Hearing loss    Melanoma ([place])    Multiple myeloma ([place])    Prostate cancer ([place]) 2002   S/P radiation  therapy 08/21/14-09/04/14   T4-6 25Gy/108f   S/P radiation therapy 11/29/14-12/12/14   rt prox humerus/shoulder/scapula 25Gy/143f  Skin cancer 1994   melanoma  right neck    [place]   Thyroid disease     ALLERGIES:  is allergic to iodine.  MEDICATIONS:  Current Outpatient Medications  Medication Sig Dispense Refill   aspirin 81 MG EC tablet Take 81 mg by mouth daily.  0   Calcium Carbonate-Vitamin D (CALTRATE 600+D PO) Take 600 mg by mouth 2 (two) times daily.     cholecalciferol (VITAMIN D) 1000 UNITS tablet Take 1,000 Units by mouth at bedtime.      clindamycin (CLEOCIN T) 1 % lotion APPLY AA BID PRN     diphenoxylate-atropine (LOMOTIL) 2.5-0.025 MG tablet Take 2 tablets by mouth 4 (four) times daily as needed for diarrhea or loose stools. 30 tablet 0   furosemide (LASIX) 20 MG tablet Take 20 mg by mouth daily. Pt takes 1 tablet daily except on Mon and Thurs takes 2 tablets     hydrocortisone 2.5 % lotion APP EXT TO THE SCALP HS PRN     KLOR-CON 10 10 MEQ tablet Take 10 mEq by mouth daily.     levothyroxine (SYNTHROID, LEVOTHROID) 112 MCG tablet TK 1 T PO QD IN THE MORNING OES  6   methocarbamol (ROBAXIN) 500 MG tablet TAKE 1 TABLET EVERY 6 HOURS AS NEEDED FOR MUSCLE SPASMS 180 tablet 4   Multiple Vitamins-Minerals (CENTRUM SILVER PO) Take 1 tablet by mouth daily.     omeprazole (  PRILOSEC) 40 MG capsule Take 40 mg by mouth daily as needed (heartburn).      oxybutynin (DITROPAN) 5 MG tablet TK 1 T PO ONCE D     sennosides-docusate sodium (SENOKOT-S) 8.6-50 MG tablet Take 1-2 tablets by mouth 2 (two) times daily as needed for constipation. Reported on 01/23/2016     tamsulosin (FLOMAX) 0.4 MG CAPS capsule Take 1 capsule (0.4 mg total) by mouth at bedtime. 30 capsule 3   TEXACORT 2.5 % SOLN APP TO THE SCALP HS PRN  2   No current facility-administered medications for this visit.   Facility-Administered Medications Ordered in Other Visits  Medication Dose Route  Frequency Provider Last Rate Last Admin   sodium chloride 0.9 % injection 10 mL  10 mL Intracatheter PRN Curt Bears, MD        SURGICAL HISTORY:  Past Surgical History:  Procedure Laterality Date   HERNIA REPAIR     POSTERIOR CERVICAL FUSION/FORAMINOTOMY N/A 06/17/2014   Procedure: Thoracic five laminectomy for epidural tumor resection Thoracic 4-7 posterior lateral arthrodesis, segmental pedicle screw fixation.;  Surgeon: Ashok Pall, MD;  Location: [facility];  Service: Neurosurgery;  Laterality: N/A;    REVIEW OF SYSTEMS:  A comprehensive review of systems was negative except for: Constitutional: positive for fatigue Musculoskeletal: positive for arthralgias and muscle weakness Neurological: positive for gait problems   PHYSICAL EXAMINATION: General appearance: alert, cooperative, fatigued and no distress Head: Normocephalic, without obvious abnormality, atraumatic Neck: no adenopathy, no JVD, supple, symmetrical, trachea midline and thyroid not enlarged, symmetric, no tenderness/mass/nodules Lymph nodes: Cervical, supraclavicular, and axillary nodes normal. Resp: clear to auscultation bilaterally Back: symmetric, no curvature. ROM normal. No CVA tenderness. Cardio: regular rate and rhythm, S1, S2 normal, no murmur, click, rub or gallop GI: soft, non-tender; bowel sounds normal; no masses,  no organomegaly Extremities: extremities normal, atraumatic, no cyanosis or edema  ECOG PERFORMANCE STATUS: 1 - Symptomatic but completely ambulatory  Blood pressure 129/60, pulse 95, temperature 97.8 F (36.6 C), temperature source Temporal, resp. rate 18, height '5\' 9"'  (1.753 m), weight 153 lb 12.8 oz (69.8 kg), SpO2 100 %.  LABORATORY DATA: Lab Results  Component Value Date   WBC 5.4 07/20/2019   HGB 13.3 07/20/2019   HCT 40.5 07/20/2019   MCV 95.7 07/20/2019   PLT 152 07/20/2019      Chemistry      Component Value Date/Time   NA 142 07/20/2019 0847   NA 141 07/15/2017  0949   K 4.5 07/20/2019 0847   K 4.4 07/15/2017 0949   CL 101 07/20/2019 0847   CO2 31 07/20/2019 0847   CO2 27 07/15/2017 0949   BUN 26 (H) 07/20/2019 0847   BUN 18.0 07/15/2017 0949   CREATININE 1.15 07/20/2019 0847   CREATININE 1.1 07/15/2017 0949      Component Value Date/Time   CALCIUM 9.5 07/20/2019 0847   CALCIUM 9.2 07/15/2017 0949   ALKPHOS 62 07/20/2019 0847   ALKPHOS 56 07/15/2017 0949   AST 21 07/20/2019 0847   AST 20 07/15/2017 0949   ALT 18 07/20/2019 0847   ALT 16 07/15/2017 0949   BILITOT 0.6 07/20/2019 0847   BILITOT 0.61 07/15/2017 0949       RADIOGRAPHIC STUDIES: ECHOCARDIOGRAM COMPLETE  Result Date: 07/09/2019   ECHOCARDIOGRAM REPORT   Patient Name:   Bettye Boeck Date of Exam: 07/08/2019 Medical Rec #:  [id number]     Height:       69.0 in Accession #:  [id number]    Weight:       153.8 lb Date of Birth:  06-22-1930     BSA:          1.85 m Patient Age:    58 years      BP:           126/71 mmHg Patient Gender: M             HR:           78 bpm. Exam Location:  [facility] Procedure: 2D Echo, Cardiac Doppler and Color Doppler Indications:    R01.1 Murmur  History:        Patient has prior history of Echocardiogram examinations, most                 recent 02/12/2015. Stroke. Edema.  Sonographer:    Diamond Nickel RCS Referring Phys: [place]  1. The aortic valve is abnormal. Aortic valve is severeley calcified. Aortic valve regurgitation is not visualized. Moderate to severe aortic valve stenosis. Vmax 3.8 m/s and MG 33 mmHG suggest moderate AS but AVA 0.6 cm^2 suggests severe. Could be paradoxical low flow, low gradient severe AS, as low SV index (32 cc/m^2). DI 0.19, suggestive of severe AS  2. Left ventricular ejection fraction, by visual estimation, is 60 to 65%. The left ventricle has normal function. There is mildly increased left ventricular hypertrophy.  3. Global right ventricle has normal systolic function.The right ventricular size  is normal. No increase in right ventricular wall thickness.  4. Left atrial size was normal.  5. Right atrial size was normal.  6. The mitral valve is normal in structure. No evidence of mitral valve regurgitation.  7. The tricuspid valve is normal in structure. Tricuspid valve regurgitation is not demonstrated.  8. The pulmonic valve was not well visualized. Pulmonic valve regurgitation is not visualized.  9. The inferior vena cava is dilated in size with <50% respiratory variability, suggesting right atrial pressure of 15 mmHg. FINDINGS  Left Ventricle: Left ventricular ejection fraction, by visual estimation, is 60 to 65%. The left ventricle has normal function. The left ventricular internal cavity size was the left ventricle is normal in size. There is mildly increased left ventricular hypertrophy. Asymmetric left ventricular hypertrophy. Right Ventricle: The right ventricular size is normal. No increase in right ventricular wall thickness. Global RV systolic function is has normal systolic function. Left Atrium: Left atrial size was normal in size. Right Atrium: Right atrial size was normal in size Pericardium: Trivial pericardial effusion is present. Mitral Valve: The mitral valve is normal in structure. No evidence of mitral valve regurgitation. Tricuspid Valve: The tricuspid valve is normal in structure. Tricuspid valve regurgitation is not demonstrated. Aortic Valve: The aortic valve is abnormal. Aortic valve regurgitation is not visualized. Moderate to severe aortic stenosis is present. There is severe calcifcation of the aortic valve. Aortic valve mean gradient measures 30.0 mmHg. Aortic valve peak gradient measures 52.9 mmHg. Aortic valve area, by VTI measures 0.70 cm. Pulmonic Valve: The pulmonic valve was not well visualized. Pulmonic valve regurgitation is not visualized. Aorta: The aortic root and ascending aorta are structurally normal, with no evidence of dilitation. Venous: The inferior vena cava  is dilated in size with less than 50% respiratory variability, suggesting right atrial pressure of 15 mmHg. IAS/Shunts: The atrial septum is grossly normal.  LEFT VENTRICLE PLAX 2D LVIDd:         3.80 cm  Diastology LVIDs:  2.20 cm  LV e' lateral:   8.16 cm/s LV PW:         1.00 cm  LV E/e' lateral: 10.1 LV IVS:        1.50 cm  LV e' medial:    6.85 cm/s LVOT diam:     2.05 cm  LV E/e' medial:  12.1 LV SV:         46 ml LV SV Index:   24.80 LVOT Area:     3.30 cm  RIGHT VENTRICLE RV Basal diam:  2.40 cm RV S prime:     11.10 cm/s TAPSE (M-mode): 1.9 cm LEFT ATRIUM             Index       RIGHT ATRIUM           Index LA diam:        2.80 cm 1.52 cm/m  RA Pressure: 3.00 mmHg LA Vol (A2C):   44.4 ml 24.03 ml/m RA Area:     16.00 cm LA Vol (A4C):   29.1 ml 15.75 ml/m RA Volume:   41.50 ml  22.46 ml/m LA Biplane Vol: 36.1 ml 19.54 ml/m  AORTIC VALVE AV Area (Vmax):    0.74 cm AV Area (Vmean):   0.68 cm AV Area (VTI):     0.70 cm AV Vmax:           363.50 cm/s AV Vmean:          249.000 cm/s AV VTI:            0.890 m AV Peak Grad:      52.9 mmHg AV Mean Grad:      30.0 mmHg LVOT Vmax:         81.60 cm/s LVOT Vmean:        51.300 cm/s LVOT VTI:          0.190 m LVOT/AV VTI ratio: 0.21  AORTA Ao Root diam: 2.90 cm MITRAL VALVE                         TRICUSPID VALVE                                      Estimated RAP:  3.00 mmHg  MV Decel Time: 232 msec              SHUNTS MV E velocity: 82.70 cm/s  103 cm/s  Systemic VTI:  0.19 m MV A velocity: 105.00 cm/s 70.3 cm/s Systemic Diam: 2.05 cm MV E/A ratio:  0.79        1.5  Oswaldo Milian MD Electronically signed by Oswaldo Milian MD Signature Date/Time: 07/09/2019/11:55:50 AM    Final     ASSESSMENT AND PLAN:  This is a very pleasant 85 years old white male with history of multiple myeloma diagnosed in November 2015 status post several chemotherapy regimens as well as radiation. The patient is currently on observation and he is feeling  fine. He had repeat myeloma panel performed recently.  I discussed the lab results with the patient.  His myeloma panel showed no concerning findings for progression. I recommended for the patient to continue on observation with repeat myeloma panel in 3 months. He will continue on Zometa infusion every 3 months for the bone disease. The patient was advised to call immediately if he has any concerning  symptoms in the interval. The patient voices understanding of current disease status and treatment options and is in agreement with the current care plan.  All questions were answered. The patient knows to call the clinic with any problems, questions or concerns. We can certainly see the patient much sooner if necessary. I spent 10 minutes counseling the patient face to face. The total time spent in the appointment was 15 minutes.  Disclaimer: This note was dictated with voice recognition software. Similar sounding words can inadvertently be transcribed and may not be corrected upon review.

## 2019-07-27 NOTE — Patient Instructions (Signed)
Zoledronic Acid injection (Hypercalcemia, Oncology) What is this medicine? ZOLEDRONIC ACID (ZOE le dron ik AS id) lowers the amount of calcium loss from bone. It is used to treat too much calcium in your blood from cancer. It is also used to prevent complications of cancer that has spread to the bone. This medicine may be used for other purposes; ask your health care provider or pharmacist if you have questions. COMMON BRAND NAME(S): Zometa What should I tell my health care provider before I take this medicine? They need to know if you have any of these conditions:  aspirin-sensitive asthma  cancer, especially if you are receiving medicines used to treat cancer  dental disease or wear dentures  infection  kidney disease  receiving corticosteroids like dexamethasone or prednisone  an unusual or allergic reaction to zoledronic acid, other medicines, foods, dyes, or preservatives  pregnant or trying to get pregnant  breast-feeding How should I use this medicine? This medicine is for infusion into a vein. It is given by a health care professional in a hospital or clinic setting. Talk to your pediatrician regarding the use of this medicine in children. Special care may be needed. Overdosage: If you think you have taken too much of this medicine contact a poison control center or emergency room at once. NOTE: This medicine is only for you. Do not share this medicine with others. What if I miss a dose? It is important not to miss your dose. Call your doctor or health care professional if you are unable to keep an appointment. What may interact with this medicine?  certain antibiotics given by injection  NSAIDs, medicines for pain and inflammation, like ibuprofen or naproxen  some diuretics like bumetanide, furosemide  teriparatide  thalidomide This list may not describe all possible interactions. Give your health care provider a list of all the medicines, herbs, non-prescription  drugs, or dietary supplements you use. Also tell them if you smoke, drink alcohol, or use illegal drugs. Some items may interact with your medicine. What should I watch for while using this medicine? Visit your doctor or health care professional for regular checkups. It may be some time before you see the benefit from this medicine. Do not stop taking your medicine unless your doctor tells you to. Your doctor may order blood tests or other tests to see how you are doing. Women should inform their doctor if they wish to become pregnant or think they might be pregnant. There is a potential for serious side effects to an unborn child. Talk to your health care professional or pharmacist for more information. You should make sure that you get enough calcium and vitamin D while you are taking this medicine. Discuss the foods you eat and the vitamins you take with your health care professional. Some people who take this medicine have severe bone, joint, and/or muscle pain. This medicine may also increase your risk for jaw problems or a broken thigh bone. Tell your doctor right away if you have severe pain in your jaw, bones, joints, or muscles. Tell your doctor if you have any pain that does not go away or that gets worse. Tell your dentist and dental surgeon that you are taking this medicine. You should not have major dental surgery while on this medicine. See your dentist to have a dental exam and fix any dental problems before starting this medicine. Take good care of your teeth while on this medicine. Make sure you see your dentist for regular follow-up   appointments. What side effects may I notice from receiving this medicine? Side effects that you should report to your doctor or health care professional as soon as possible:  allergic reactions like skin rash, itching or hives, swelling of the face, lips, or tongue  anxiety, confusion, or depression  breathing problems  changes in vision  eye  pain  feeling faint or lightheaded, falls  jaw pain, especially after dental work  mouth sores  muscle cramps, stiffness, or weakness  redness, blistering, peeling or loosening of the skin, including inside the mouth  trouble passing urine or change in the amount of urine Side effects that usually do not require medical attention (report to your doctor or health care professional if they continue or are bothersome):  bone, joint, or muscle pain  constipation  diarrhea  fever  hair loss  irritation at site where injected  loss of appetite  nausea, vomiting  stomach upset  trouble sleeping  trouble swallowing  weak or tired This list may not describe all possible side effects. Call your doctor for medical advice about side effects. You may report side effects to FDA at 1-800-FDA-1088. Where should I keep my medicine? This drug is given in a hospital or clinic and will not be stored at home. NOTE: This sheet is a summary. It may not cover all possible information. If you have questions about this medicine, talk to your doctor, pharmacist, or health care provider.  2020 Elsevier/Gold Standard (2013-12-17 14:19:39)  

## 2019-07-28 ENCOUNTER — Telehealth: Payer: Self-pay | Admitting: Internal Medicine

## 2019-07-28 NOTE — Telephone Encounter (Signed)
Scheduled appt per 12/23 los.  Sent a message to HIM pool to get a calendar mailed out. 

## 2019-10-19 ENCOUNTER — Other Ambulatory Visit: Payer: Self-pay

## 2019-10-19 ENCOUNTER — Inpatient Hospital Stay: Payer: Medicare Other | Attending: Internal Medicine

## 2019-10-19 DIAGNOSIS — Z85828 Personal history of other malignant neoplasm of skin: Secondary | ICD-10-CM | POA: Insufficient documentation

## 2019-10-19 DIAGNOSIS — Z8546 Personal history of malignant neoplasm of prostate: Secondary | ICD-10-CM | POA: Insufficient documentation

## 2019-10-19 DIAGNOSIS — M25512 Pain in left shoulder: Secondary | ICD-10-CM | POA: Insufficient documentation

## 2019-10-19 DIAGNOSIS — Z79899 Other long term (current) drug therapy: Secondary | ICD-10-CM | POA: Diagnosis not present

## 2019-10-19 DIAGNOSIS — C7951 Secondary malignant neoplasm of bone: Secondary | ICD-10-CM | POA: Insufficient documentation

## 2019-10-19 DIAGNOSIS — E079 Disorder of thyroid, unspecified: Secondary | ICD-10-CM | POA: Diagnosis not present

## 2019-10-19 DIAGNOSIS — R5383 Other fatigue: Secondary | ICD-10-CM | POA: Insufficient documentation

## 2019-10-19 DIAGNOSIS — Z8673 Personal history of transient ischemic attack (TIA), and cerebral infarction without residual deficits: Secondary | ICD-10-CM | POA: Insufficient documentation

## 2019-10-19 DIAGNOSIS — Z7982 Long term (current) use of aspirin: Secondary | ICD-10-CM | POA: Insufficient documentation

## 2019-10-19 DIAGNOSIS — C9000 Multiple myeloma not having achieved remission: Secondary | ICD-10-CM | POA: Insufficient documentation

## 2019-10-19 DIAGNOSIS — K219 Gastro-esophageal reflux disease without esophagitis: Secondary | ICD-10-CM | POA: Diagnosis not present

## 2019-10-19 LAB — CMP (CANCER CENTER ONLY)
ALT: 18 U/L (ref 0–44)
AST: 20 U/L (ref 15–41)
Albumin: 3.9 g/dL (ref 3.5–5.0)
Alkaline Phosphatase: 62 U/L (ref 38–126)
Anion gap: 9 (ref 5–15)
BUN: 24 mg/dL — ABNORMAL HIGH (ref 8–23)
CO2: 29 mmol/L (ref 22–32)
Calcium: 9.1 mg/dL (ref 8.9–10.3)
Chloride: 104 mmol/L (ref 98–111)
Creatinine: 1.08 mg/dL (ref 0.61–1.24)
GFR, Est AFR Am: >60 mL/min (ref >60)
GFR, Estimated: >60 mL/min (ref >60)
Glucose, Bld: 107 mg/dL — ABNORMAL HIGH (ref 70–99)
Potassium: 4 mmol/L (ref 3.5–5.1)
Sodium: 142 mmol/L (ref 135–145)
Total Bilirubin: 0.6 mg/dL (ref 0.3–1.2)
Total Protein: 6.3 g/dL — ABNORMAL LOW (ref 6.5–8.1)

## 2019-10-19 LAB — CBC WITH DIFFERENTIAL (CANCER CENTER ONLY)
Abs Immature Granulocytes: 0.01 10*3/uL (ref 0.00–0.07)
Basophils Absolute: 0 10*3/uL (ref 0.0–0.1)
Basophils Relative: 0 %
Eosinophils Absolute: 0.1 10*3/uL (ref 0.0–0.5)
Eosinophils Relative: 3 %
HCT: 40.1 % (ref 39.0–52.0)
Hemoglobin: 12.9 g/dL — ABNORMAL LOW (ref 13.0–17.0)
Immature Granulocytes: 0 %
Lymphocytes Relative: 17 %
Lymphs Abs: 0.8 10*3/uL (ref 0.7–4.0)
MCH: 30.9 pg (ref 26.0–34.0)
MCHC: 32.2 g/dL (ref 30.0–36.0)
MCV: 96.2 fL (ref 80.0–100.0)
Monocytes Absolute: 0.5 10*3/uL (ref 0.1–1.0)
Monocytes Relative: 10 %
Neutro Abs: 3.4 10*3/uL (ref 1.7–7.7)
Neutrophils Relative %: 70 %
Platelet Count: 148 10*3/uL — ABNORMAL LOW (ref 150–400)
RBC: 4.17 MIL/uL — ABNORMAL LOW (ref 4.22–5.81)
RDW: 13.9 % (ref 11.5–15.5)
WBC Count: 4.9 10*3/uL (ref 4.0–10.5)
nRBC: 0 % (ref 0.0–0.2)

## 2019-10-19 LAB — LACTATE DEHYDROGENASE: LDH: 202 U/L — ABNORMAL HIGH (ref 98–192)

## 2019-10-20 LAB — IGG, IGA, IGM
IgA: 124 mg/dL (ref 61–437)
IgG (Immunoglobin G), Serum: 817 mg/dL (ref 603–1613)
IgM (Immunoglobulin M), Srm: 63 mg/dL (ref 15–143)

## 2019-10-20 LAB — KAPPA/LAMBDA LIGHT CHAINS
Kappa free light chain: 17.2 mg/L (ref 3.3–19.4)
Kappa, lambda light chain ratio: 1.4 (ref 0.26–1.65)
Lambda free light chains: 12.3 mg/L (ref 5.7–26.3)

## 2019-10-20 LAB — BETA 2 MICROGLOBULIN, SERUM: Beta-2 Microglobulin: 2.9 mg/L — ABNORMAL HIGH (ref 0.6–2.4)

## 2019-10-26 ENCOUNTER — Other Ambulatory Visit: Payer: Self-pay

## 2019-10-26 ENCOUNTER — Inpatient Hospital Stay: Payer: Medicare Other

## 2019-10-26 ENCOUNTER — Encounter: Payer: Self-pay | Admitting: Internal Medicine

## 2019-10-26 ENCOUNTER — Inpatient Hospital Stay (HOSPITAL_BASED_OUTPATIENT_CLINIC_OR_DEPARTMENT_OTHER): Payer: Medicare Other | Admitting: Internal Medicine

## 2019-10-26 ENCOUNTER — Other Ambulatory Visit: Payer: Self-pay | Admitting: Medical Oncology

## 2019-10-26 VITALS — BP 136/68 | HR 94 | Temp 98.0°F | Resp 18 | Ht 69.0 in | Wt 153.2 lb

## 2019-10-26 DIAGNOSIS — C419 Malignant neoplasm of bone and articular cartilage, unspecified: Secondary | ICD-10-CM

## 2019-10-26 DIAGNOSIS — C7951 Secondary malignant neoplasm of bone: Secondary | ICD-10-CM | POA: Diagnosis not present

## 2019-10-26 DIAGNOSIS — R5383 Other fatigue: Secondary | ICD-10-CM | POA: Diagnosis not present

## 2019-10-26 DIAGNOSIS — K219 Gastro-esophageal reflux disease without esophagitis: Secondary | ICD-10-CM | POA: Diagnosis not present

## 2019-10-26 DIAGNOSIS — C9000 Multiple myeloma not having achieved remission: Secondary | ICD-10-CM | POA: Diagnosis not present

## 2019-10-26 DIAGNOSIS — E079 Disorder of thyroid, unspecified: Secondary | ICD-10-CM | POA: Diagnosis not present

## 2019-10-26 DIAGNOSIS — M25512 Pain in left shoulder: Secondary | ICD-10-CM | POA: Diagnosis not present

## 2019-10-26 MED ORDER — ZOLEDRONIC ACID 4 MG/100ML IV SOLN
4.0000 mg | Freq: Once | INTRAVENOUS | Status: AC
  Administered 2019-10-26: 4 mg via INTRAVENOUS

## 2019-10-26 MED ORDER — SODIUM CHLORIDE 0.9 % IV SOLN
INTRAVENOUS | Status: DC
  Filled 2019-10-26: qty 250

## 2019-10-26 MED ORDER — ZOLEDRONIC ACID 4 MG/100ML IV SOLN
INTRAVENOUS | Status: AC
  Filled 2019-10-26: qty 100

## 2019-10-26 NOTE — Addendum Note (Signed)
Addended by: Ardeen Garland on: 10/26/2019 09:45 AM   Modules accepted: Orders

## 2019-10-26 NOTE — Patient Instructions (Signed)
COVID-19 Vaccine Information can be found at: [email or web address] For questions related to vaccine distribution or appointments, please email [email or web address] or call [phone].   Zoledronic Acid injection (Hypercalcemia, Oncology) What is this medicine? ZOLEDRONIC ACID (ZOE le dron ik AS id) lowers the amount of calcium loss from bone. It is used to treat too much calcium in your blood from cancer. It is also used to prevent complications of cancer that has spread to the bone. This medicine may be used for other purposes; ask your health care provider or pharmacist if you have questions. COMMON BRAND NAME(S): Zometa What should I tell my health care provider before I take this medicine? They need to know if you have any of these conditions:  aspirin-sensitive asthma  cancer, especially if you are receiving medicines used to treat cancer  dental disease or wear dentures  infection  kidney disease  receiving corticosteroids like dexamethasone or prednisone  an unusual or allergic reaction to zoledronic acid, other medicines, foods, dyes, or preservatives  pregnant or trying to get pregnant  breast-feeding How should I use this medicine? This medicine is for infusion into a vein. It is given by a health care professional in a hospital or clinic setting. Talk to your pediatrician regarding the use of this medicine in children. Special care may be needed. Overdosage: If you think you have taken too much of this medicine contact a poison control center or emergency room at once. NOTE: This medicine is only for you. Do not share this medicine with others. What if I miss a dose? It is important not to miss your dose. Call your doctor or health care professional if you are unable to keep an appointment. What may interact with this medicine?  certain antibiotics given by injection  NSAIDs, medicines for pain and  inflammation, like ibuprofen or naproxen  some diuretics like bumetanide, furosemide  teriparatide  thalidomide This list may not describe all possible interactions. Give your health care provider a list of all the medicines, herbs, non-prescription drugs, or dietary supplements you use. Also tell them if you smoke, drink alcohol, or use illegal drugs. Some items may interact with your medicine. What should I watch for while using this medicine? Visit your doctor or health care professional for regular checkups. It may be some time before you see the benefit from this medicine. Do not stop taking your medicine unless your doctor tells you to. Your doctor may order blood tests or other tests to see how you are doing. Women should inform their doctor if they wish to become pregnant or think they might be pregnant. There is a potential for serious side effects to an unborn child. Talk to your health care professional or pharmacist for more information. You should make sure that you get enough calcium and vitamin D while you are taking this medicine. Discuss the foods you eat and the vitamins you take with your health care professional. Some people who take this medicine have severe bone, joint, and/or muscle pain. This medicine may also increase your risk for jaw problems or a broken thigh bone. Tell your doctor right away if you have severe pain in your jaw, bones, joints, or muscles. Tell your doctor if you have any pain that does not go away or that gets worse. Tell your dentist and dental surgeon that you are taking this medicine. You should not have major dental surgery while on this medicine. See your dentist to have a dental exam and fix any dental   problems before starting this medicine. Take good care of your teeth while on this medicine. Make sure you see your dentist for regular follow-up appointments. What side effects may I notice from receiving this medicine? Side effects that you should  report to your doctor or health care professional as soon as possible:  allergic reactions like skin rash, itching or hives, swelling of the face, lips, or tongue  anxiety, confusion, or depression  breathing problems  changes in vision  eye pain  feeling faint or lightheaded, falls  jaw pain, especially after dental work  mouth sores  muscle cramps, stiffness, or weakness  redness, blistering, peeling or loosening of the skin, including inside the mouth  trouble passing urine or change in the amount of urine Side effects that usually do not require medical attention (report to your doctor or health care professional if they continue or are bothersome):  bone, joint, or muscle pain  constipation  diarrhea  fever  hair loss  irritation at site where injected  loss of appetite  nausea, vomiting  stomach upset  trouble sleeping  trouble swallowing  weak or tired This list may not describe all possible side effects. Call your doctor for medical advice about side effects. You may report side effects to FDA at 1-800-FDA-1088. Where should I keep my medicine? This drug is given in a hospital or clinic and will not be stored at home. NOTE: This sheet is a summary. It may not cover all possible information. If you have questions about this medicine, talk to your doctor, pharmacist, or health care provider.  2020 Elsevier/Gold Standard (2013-12-17 14:19:39)  Coronavirus (COVID-19) Are you at risk?  Are you at risk for the Coronavirus (COVID-19)?  To be considered HIGH RISK for Coronavirus (COVID-19), you have to meet the following criteria:  . Traveled to [place], [place], [place], [place] or [place]; or in the [place] to [place], [place], [place], or [place]; and have fever, cough, and shortness of breath within the last 2 weeks of travel OR . Been in close contact with a person diagnosed with COVID-19 within the last 2 weeks and have fever, cough, and  shortness of breath . IF YOU DO NOT MEET THESE CRITERIA, YOU ARE CONSIDERED LOW RISK FOR COVID-19.  What to do if you are HIGH RISK for COVID-19?  . If you are having a medical emergency, call 911. . Seek medical care right away. Before you go to a doctor's office, urgent care or emergency department, call ahead and tell them about your recent travel, contact with someone diagnosed with COVID-19, and your symptoms. You should receive instructions from your physician's office regarding next steps of care.  . When you arrive at healthcare provider, tell the healthcare staff immediately you have returned from visiting [place], [place], [place], [place] or [place]; or traveled in the [place] to [place], [place], [place], or [place]; in the last two weeks or you have been in close contact with a person diagnosed with COVID-19 in the last 2 weeks.   . Tell the health care staff about your symptoms: fever, cough and shortness of breath. . After you have been seen by a medical provider, you will be either: o Tested for (COVID-19) and discharged home on quarantine except to seek medical care if symptoms worsen, and asked to  - Stay home and avoid contact with others until you get your results (4-5 days)  - Avoid travel on public transportation if   possible (such as bus, train, or airplane) or o Sent to the Emergency Department by EMS for evaluation, COVID-19 testing, and possible admission depending on your condition and test results.  What to do if you are LOW RISK for COVID-19?  Reduce your risk of any infection by using the same precautions used for avoiding the common cold or flu:  . Wash your hands often with soap and warm water for at least 20 seconds.  If soap and water are not readily available, use an alcohol-based hand sanitizer with at least 60% alcohol.  . If coughing or sneezing, cover your mouth and nose by coughing or sneezing into the elbow areas of your shirt or coat, into a  tissue or into your sleeve (not your hands). . Avoid shaking hands with others and consider head nods or verbal greetings only. . Avoid touching your eyes, nose, or mouth with unwashed hands.  . Avoid close contact with people who are sick. . Avoid places or events with large numbers of people in one location, like concerts or sporting events. . Carefully consider travel plans you have or are making. . If you are planning any travel outside or inside the [place], visit the CDC's Travelers' Health webpage for the latest health notices. . If you have some symptoms but not all symptoms, continue to monitor at home and seek medical attention if your symptoms worsen. . If you are having a medical emergency, call 911.   ADDITIONAL HEALTHCARE OPTIONS FOR PATIENTS  [facility] / e-Visit: [email or web address]         [facility]: [phone]  [facility]: [phone]                   [facility]: [phone]   

## 2019-10-26 NOTE — Progress Notes (Signed)
[facility] [phone]   [phone]  OFFICE PROGRESS NOTE  Wenda Low, MD [address] [place] [address]  DIAGNOSIS: Multiple myeloma diagnosed in November 2015.  PRIOR THERAPY: 1) Status post Thoracic five laminectomy for epidural tumor resection, Thoracic 4-7 posterior lateral arthrodesis,  segmental pedicle screw fixation T4-T7 Globus instrumentation under the care of Dr. Christella Noa on 06/18/2014. 2) Systemic chemotherapy with Velcade 1.3 MG/M2 subcutaneously weekly in addition to Decadron 40 mg by mouth on weekly basis. Status post 11 cycles. 3) palliative radiotherapy to the right proximal humerus, shoulder and scapula under the care of Dr. Lisbeth Renshaw completed on 12/12/2014. 4)  Systemic chemotherapy with Velcade 1.3 MG/M2 subcutaneously weekly, Decadron 40 mg by mouth weekly in addition to Revlimid 25 MG by mouth daily for 21 days every 4 weeks, status post 14 cycles.   CURRENT THERAPY: Zometa 4 mg IV every 12 weeks. First dose was given 11/01/2014.  INTERVAL HISTORY: Jeremiah Owens 86 y.o. male returns to the clinic today for follow-up visit.  The patient is feeling fine today with no concerning complaints except for the chronic left shoulder pain.  He is celebrating his 89th birthday today.  He denied having any chest pain, shortness of breath, cough or hemoptysis.  He denied having any fever or chills.  He has no nausea, vomiting, diarrhea or constipation.  He has no headache or visual changes.  The patient has no weight loss or night sweats.  He is currently on observation.  He had repeat myeloma panel performed recently and he is here for evaluation and discussion of his lab results.  MEDICAL HISTORY: Past Medical History:  Diagnosis Date  . Allergy   . Anxiety   . Bone cancer ([place])    t_spine T-5  . Compression fracture   . Encounter for antineoplastic chemotherapy 01/31/2015  . Hearing loss   . Melanoma ([place])   .  Multiple myeloma ([place])   . Prostate cancer ([place]) 2002  . S/P radiation therapy 08/21/14-09/04/14   T4-6 25Gy/52f  . S/P radiation therapy 11/29/14-12/12/14   rt prox humerus/shoulder/scapula 25Gy/194f . Skin cancer 1994   melanoma  right neck   . Stroke ([place]  . Thyroid disease     ALLERGIES:  is allergic to iodine.  MEDICATIONS:  Current Outpatient Medications  Medication Sig Dispense Refill  . aspirin 81 MG EC tablet Take 81 mg by mouth daily.  0  . Calcium Carbonate-Vitamin D (CALTRATE 600+D PO) Take 600 mg by mouth 2 (two) times daily.    . cholecalciferol (VITAMIN D) 1000 UNITS tablet Take 1,000 Units by mouth at bedtime.     . clindamycin (CLEOCIN T) 1 % lotion APPLY AA BID PRN    . diphenoxylate-atropine (LOMOTIL) 2.5-0.025 MG tablet Take 2 tablets by mouth 4 (four) times daily as needed for diarrhea or loose stools. 30 tablet 0  . furosemide (LASIX) 20 MG tablet Take 20 mg by mouth daily. Pt takes 1 tablet daily except on Mon and Thurs takes 2 tablets    . hydrocortisone 2.5 % lotion APP EXT TO THE SCALP HS PRN    . KLOR-CON 10 10 MEQ tablet Take 10 mEq by mouth daily.    . Marland Kitchenevothyroxine (SYNTHROID, LEVOTHROID) 112 MCG tablet TK 1 T PO QD IN THE MORNING OES  6  . methocarbamol (ROBAXIN) 500 MG tablet TAKE 1 TABLET EVERY 6 HOURS AS NEEDED FOR MUSCLE SPASMS 180 tablet 4  .  Multiple Vitamins-Minerals (CENTRUM SILVER PO) Take 1 tablet by mouth daily.    Marland Kitchen omeprazole (PRILOSEC) 40 MG capsule Take 40 mg by mouth daily as needed (heartburn).     Marland Kitchen oxybutynin (DITROPAN) 5 MG tablet TK 1 T PO ONCE D    . sennosides-docusate sodium (SENOKOT-S) 8.6-50 MG tablet Take 1-2 tablets by mouth 2 (two) times daily as needed for constipation. Reported on 01/23/2016    . tamsulosin (FLOMAX) 0.4 MG CAPS capsule Take 1 capsule (0.4 mg total) by mouth at bedtime. 30 capsule 3  . TEXACORT 2.5 % SOLN APP TO THE SCALP HS PRN  2   No current facility-administered medications for this visit.    Facility-Administered Medications Ordered in Other Visits  Medication Dose Route Frequency Provider Last Rate Last Admin  . sodium chloride 0.9 % injection 10 mL  10 mL Intracatheter PRN Curt Bears, MD        SURGICAL HISTORY:  Past Surgical History:  Procedure Laterality Date  . HERNIA REPAIR    . POSTERIOR CERVICAL FUSION/FORAMINOTOMY N/A 06/17/2014   Procedure: Thoracic five laminectomy for epidural tumor resection Thoracic 4-7 posterior lateral arthrodesis, segmental pedicle screw fixation.;  Surgeon: Ashok Pall, MD;  Location: [facility];  Service: Neurosurgery;  Laterality: N/A;    REVIEW OF SYSTEMS:  A comprehensive review of systems was negative except for: Constitutional: positive for fatigue Musculoskeletal: positive for arthralgias and muscle weakness Neurological: positive for gait problems   PHYSICAL EXAMINATION: General appearance: alert, cooperative, fatigued and no distress Head: Normocephalic, without obvious abnormality, atraumatic Neck: no adenopathy, no JVD, supple, symmetrical, trachea midline and thyroid not enlarged, symmetric, no tenderness/mass/nodules Lymph nodes: Cervical, supraclavicular, and axillary nodes normal. Resp: clear to auscultation bilaterally Back: symmetric, no curvature. ROM normal. No CVA tenderness. Cardio: regular rate and rhythm, S1, S2 normal, no murmur, click, rub or gallop GI: soft, non-tender; bowel sounds normal; no masses,  no organomegaly Extremities: extremities normal, atraumatic, no cyanosis or edema  ECOG PERFORMANCE STATUS: 1 - Symptomatic but completely ambulatory  Blood pressure 136/68, pulse 94, temperature 98 F (36.7 C), temperature source Oral, resp. rate 18, height '5\' 9"'  (1.753 m), weight 153 lb 3.2 oz (69.5 kg), SpO2 100 %.  LABORATORY DATA: Lab Results  Component Value Date   WBC 4.9 10/19/2019   HGB 12.9 (L) 10/19/2019   HCT 40.1 10/19/2019   MCV 96.2 10/19/2019   PLT 148 (L) 10/19/2019       Chemistry      Component Value Date/Time   NA 142 10/19/2019 0913   NA 141 07/15/2017 0949   K 4.0 10/19/2019 0913   K 4.4 07/15/2017 0949   CL 104 10/19/2019 0913   CO2 29 10/19/2019 0913   CO2 27 07/15/2017 0949   BUN 24 (H) 10/19/2019 0913   BUN 18.0 07/15/2017 0949   CREATININE 1.08 10/19/2019 0913   CREATININE 1.1 07/15/2017 0949      Component Value Date/Time   CALCIUM 9.1 10/19/2019 0913   CALCIUM 9.2 07/15/2017 0949   ALKPHOS 62 10/19/2019 0913   ALKPHOS 56 07/15/2017 0949   AST 20 10/19/2019 0913   AST 20 07/15/2017 0949   ALT 18 10/19/2019 0913   ALT 16 07/15/2017 0949   BILITOT 0.6 10/19/2019 0913   BILITOT 0.61 07/15/2017 0949       RADIOGRAPHIC STUDIES: No results found.  ASSESSMENT AND PLAN:  This is a very pleasant 86 years old white male with history of multiple myeloma diagnosed in  November 2015 status post several chemotherapy regimens as well as radiation. The patient is currently on observation for more than 2 years and has been doing very well. He had repeat myeloma panel performed recently. His myeloma panel showed no concerning findings for disease progression. I recommended for the patient to continue on observation with repeat myeloma panel in 3 months. He will continue his current treatment with Zometa infusion every 3 months. He will come back for follow-up visit at that time. The patient was advised to call immediately if he has any concerning symptoms in the interval. The patient voices understanding of current disease status and treatment options and is in agreement with the current care plan.  All questions were answered. The patient knows to call the clinic with any problems, questions or concerns. We can certainly see the patient much sooner if necessary.  Disclaimer: This note was dictated with voice recognition software. Similar sounding words can inadvertently be transcribed and may not be corrected upon review.

## 2019-10-27 ENCOUNTER — Telehealth: Payer: Self-pay | Admitting: Internal Medicine

## 2019-10-27 NOTE — Telephone Encounter (Signed)
Scheduled per los. Called and spoke with patient and wife. Confirmed appt  

## 2019-12-13 DIAGNOSIS — D696 Thrombocytopenia, unspecified: Secondary | ICD-10-CM | POA: Diagnosis not present

## 2019-12-13 DIAGNOSIS — C9000 Multiple myeloma not having achieved remission: Secondary | ICD-10-CM | POA: Diagnosis not present

## 2019-12-13 DIAGNOSIS — C7951 Secondary malignant neoplasm of bone: Secondary | ICD-10-CM | POA: Diagnosis not present

## 2019-12-13 DIAGNOSIS — C439 Malignant melanoma of skin, unspecified: Secondary | ICD-10-CM | POA: Diagnosis not present

## 2019-12-13 DIAGNOSIS — E039 Hypothyroidism, unspecified: Secondary | ICD-10-CM | POA: Diagnosis not present

## 2019-12-13 DIAGNOSIS — Z8546 Personal history of malignant neoplasm of prostate: Secondary | ICD-10-CM | POA: Diagnosis not present

## 2019-12-13 DIAGNOSIS — K219 Gastro-esophageal reflux disease without esophagitis: Secondary | ICD-10-CM | POA: Diagnosis not present

## 2019-12-13 DIAGNOSIS — I69351 Hemiplegia and hemiparesis following cerebral infarction affecting right dominant side: Secondary | ICD-10-CM | POA: Diagnosis not present

## 2019-12-13 DIAGNOSIS — R269 Unspecified abnormalities of gait and mobility: Secondary | ICD-10-CM | POA: Diagnosis not present

## 2019-12-22 DIAGNOSIS — H9193 Unspecified hearing loss, bilateral: Secondary | ICD-10-CM | POA: Diagnosis not present

## 2019-12-28 DIAGNOSIS — D0461 Carcinoma in situ of skin of right upper limb, including shoulder: Secondary | ICD-10-CM | POA: Diagnosis not present

## 2019-12-28 DIAGNOSIS — Z85820 Personal history of malignant melanoma of skin: Secondary | ICD-10-CM | POA: Diagnosis not present

## 2019-12-28 DIAGNOSIS — S80212A Abrasion, left knee, initial encounter: Secondary | ICD-10-CM | POA: Diagnosis not present

## 2019-12-28 DIAGNOSIS — D1801 Hemangioma of skin and subcutaneous tissue: Secondary | ICD-10-CM | POA: Diagnosis not present

## 2019-12-28 DIAGNOSIS — L821 Other seborrheic keratosis: Secondary | ICD-10-CM | POA: Diagnosis not present

## 2019-12-28 DIAGNOSIS — Z85828 Personal history of other malignant neoplasm of skin: Secondary | ICD-10-CM | POA: Diagnosis not present

## 2019-12-28 DIAGNOSIS — D692 Other nonthrombocytopenic purpura: Secondary | ICD-10-CM | POA: Diagnosis not present

## 2019-12-28 DIAGNOSIS — L570 Actinic keratosis: Secondary | ICD-10-CM | POA: Diagnosis not present

## 2020-01-18 ENCOUNTER — Inpatient Hospital Stay: Payer: Medicare Other | Attending: Internal Medicine

## 2020-01-18 ENCOUNTER — Other Ambulatory Visit: Payer: Self-pay

## 2020-01-18 DIAGNOSIS — R5383 Other fatigue: Secondary | ICD-10-CM | POA: Diagnosis not present

## 2020-01-18 DIAGNOSIS — Z85828 Personal history of other malignant neoplasm of skin: Secondary | ICD-10-CM | POA: Insufficient documentation

## 2020-01-18 DIAGNOSIS — Z8673 Personal history of transient ischemic attack (TIA), and cerebral infarction without residual deficits: Secondary | ICD-10-CM | POA: Diagnosis not present

## 2020-01-18 DIAGNOSIS — Z8546 Personal history of malignant neoplasm of prostate: Secondary | ICD-10-CM | POA: Insufficient documentation

## 2020-01-18 DIAGNOSIS — Z85820 Personal history of malignant melanoma of skin: Secondary | ICD-10-CM | POA: Diagnosis not present

## 2020-01-18 DIAGNOSIS — C7951 Secondary malignant neoplasm of bone: Secondary | ICD-10-CM | POA: Diagnosis not present

## 2020-01-18 DIAGNOSIS — R159 Full incontinence of feces: Secondary | ICD-10-CM | POA: Insufficient documentation

## 2020-01-18 DIAGNOSIS — C9000 Multiple myeloma not having achieved remission: Secondary | ICD-10-CM | POA: Insufficient documentation

## 2020-01-18 DIAGNOSIS — Z923 Personal history of irradiation: Secondary | ICD-10-CM | POA: Diagnosis not present

## 2020-01-18 LAB — CMP (CANCER CENTER ONLY)
ALT: 14 U/L (ref 0–44)
AST: 20 U/L (ref 15–41)
Albumin: 4 g/dL (ref 3.5–5.0)
Alkaline Phosphatase: 58 U/L (ref 38–126)
Anion gap: 8 (ref 5–15)
BUN: 24 mg/dL — ABNORMAL HIGH (ref 8–23)
CO2: 32 mmol/L (ref 22–32)
Calcium: 9.6 mg/dL (ref 8.9–10.3)
Chloride: 102 mmol/L (ref 98–111)
Creatinine: 1.14 mg/dL (ref 0.61–1.24)
GFR, Est AFR Am: >60 mL/min (ref >60)
GFR, Estimated: 57 mL/min — ABNORMAL LOW (ref >60)
Glucose, Bld: 89 mg/dL (ref 70–99)
Potassium: 4.3 mmol/L (ref 3.5–5.1)
Sodium: 142 mmol/L (ref 135–145)
Total Bilirubin: 0.6 mg/dL (ref 0.3–1.2)
Total Protein: 6.7 g/dL (ref 6.5–8.1)

## 2020-01-18 LAB — CBC WITH DIFFERENTIAL (CANCER CENTER ONLY)
Abs Immature Granulocytes: 0.02 10*3/uL (ref 0.00–0.07)
Basophils Absolute: 0 10*3/uL (ref 0.0–0.1)
Basophils Relative: 0 %
Eosinophils Absolute: 0.1 10*3/uL (ref 0.0–0.5)
Eosinophils Relative: 2 %
HCT: 39.5 % (ref 39.0–52.0)
Hemoglobin: 12.8 g/dL — ABNORMAL LOW (ref 13.0–17.0)
Immature Granulocytes: 0 %
Lymphocytes Relative: 15 %
Lymphs Abs: 0.9 10*3/uL (ref 0.7–4.0)
MCH: 30.6 pg (ref 26.0–34.0)
MCHC: 32.4 g/dL (ref 30.0–36.0)
MCV: 94.5 fL (ref 80.0–100.0)
Monocytes Absolute: 0.5 10*3/uL (ref 0.1–1.0)
Monocytes Relative: 8 %
Neutro Abs: 4.6 10*3/uL (ref 1.7–7.7)
Neutrophils Relative %: 75 %
Platelet Count: 148 10*3/uL — ABNORMAL LOW (ref 150–400)
RBC: 4.18 MIL/uL — ABNORMAL LOW (ref 4.22–5.81)
RDW: 14.4 % (ref 11.5–15.5)
WBC Count: 6 10*3/uL (ref 4.0–10.5)
nRBC: 0 % (ref 0.0–0.2)

## 2020-01-18 LAB — LACTATE DEHYDROGENASE: LDH: 187 U/L (ref 98–192)

## 2020-01-19 ENCOUNTER — Other Ambulatory Visit: Payer: Medicare Other

## 2020-01-19 LAB — IGG, IGA, IGM
IgA: 119 mg/dL (ref 61–437)
IgG (Immunoglobin G), Serum: 792 mg/dL (ref 603–1613)
IgM (Immunoglobulin M), Srm: 58 mg/dL (ref 15–143)

## 2020-01-19 LAB — KAPPA/LAMBDA LIGHT CHAINS
Kappa free light chain: 16.5 mg/L (ref 3.3–19.4)
Kappa, lambda light chain ratio: 1.38 (ref 0.26–1.65)
Lambda free light chains: 12 mg/L (ref 5.7–26.3)

## 2020-01-19 LAB — BETA 2 MICROGLOBULIN, SERUM: Beta-2 Microglobulin: 2.3 mg/L (ref 0.6–2.4)

## 2020-01-25 ENCOUNTER — Inpatient Hospital Stay: Payer: Medicare Other

## 2020-01-25 ENCOUNTER — Inpatient Hospital Stay (HOSPITAL_BASED_OUTPATIENT_CLINIC_OR_DEPARTMENT_OTHER): Payer: Medicare Other | Admitting: Internal Medicine

## 2020-01-25 ENCOUNTER — Encounter: Payer: Self-pay | Admitting: Internal Medicine

## 2020-01-25 ENCOUNTER — Other Ambulatory Visit: Payer: Self-pay

## 2020-01-25 VITALS — BP 137/66 | HR 86 | Temp 97.7°F | Resp 18 | Ht 69.0 in | Wt 150.9 lb

## 2020-01-25 DIAGNOSIS — C9000 Multiple myeloma not having achieved remission: Secondary | ICD-10-CM | POA: Diagnosis not present

## 2020-01-25 DIAGNOSIS — Z8546 Personal history of malignant neoplasm of prostate: Secondary | ICD-10-CM | POA: Diagnosis not present

## 2020-01-25 DIAGNOSIS — Z85820 Personal history of malignant melanoma of skin: Secondary | ICD-10-CM | POA: Diagnosis not present

## 2020-01-25 DIAGNOSIS — R5383 Other fatigue: Secondary | ICD-10-CM | POA: Diagnosis not present

## 2020-01-25 DIAGNOSIS — R159 Full incontinence of feces: Secondary | ICD-10-CM | POA: Diagnosis not present

## 2020-01-25 DIAGNOSIS — C7951 Secondary malignant neoplasm of bone: Secondary | ICD-10-CM | POA: Diagnosis not present

## 2020-01-25 MED ORDER — SODIUM CHLORIDE 0.9% FLUSH
10.0000 mL | INTRAVENOUS | Status: DC | PRN
  Filled 2020-01-25: qty 10

## 2020-01-25 MED ORDER — ZOLEDRONIC ACID 4 MG/100ML IV SOLN
4.0000 mg | Freq: Once | INTRAVENOUS | Status: AC
  Administered 2020-01-25: 4 mg via INTRAVENOUS

## 2020-01-25 MED ORDER — SODIUM CHLORIDE 0.9 % IV SOLN
INTRAVENOUS | Status: DC | PRN
  Filled 2020-01-25: qty 250

## 2020-01-25 MED ORDER — ZOLEDRONIC ACID 4 MG/100ML IV SOLN
INTRAVENOUS | Status: AC
  Filled 2020-01-25: qty 100

## 2020-01-25 NOTE — Progress Notes (Signed)
    [facility] [phone]   [phone]  OFFICE PROGRESS NOTE  Husain, Karrar, MD [address] [place] [address]  DIAGNOSIS: Multiple myeloma diagnosed in November 2015.  PRIOR THERAPY: 1) Status post Thoracic five laminectomy for epidural tumor resection, Thoracic 4-7 posterior lateral arthrodesis,  segmental pedicle screw fixation T4-T7 Globus instrumentation under the care of Dr. Cabbell on 06/18/2014. 2) Systemic chemotherapy with Velcade 1.3 MG/M2 subcutaneously weekly in addition to Decadron 40 mg by mouth on weekly basis. Status post 11 cycles. 3) palliative radiotherapy to the right proximal humerus, shoulder and scapula under the care of Dr. Moody completed on 12/12/2014. 4)  Systemic chemotherapy with Velcade 1.3 MG/M2 subcutaneously weekly, Decadron 40 mg by mouth weekly in addition to Revlimid 25 MG by mouth daily for 21 days every 4 weeks, status post 14 cycles.   CURRENT THERAPY: Zometa 4 mg IV every 12 weeks. First dose was given 11/01/2014.  INTERVAL HISTORY: Jeremiah Owens 86 y.o. male returns to the clinic today for follow-up visit.  The patient is feeling fine today with no concerning complaints except for the generalized fatigue.  He mentioned that he has occasional incontinence of his stool but he still have well control of his urination.  He denied having any back pain.  He denied having any chest pain, shortness of breath, cough or hemoptysis.  He denied having any fever or chills.  He has no nausea, vomiting, diarrhea or constipation.  He has no headache or visual changes.  He is here today for evaluation and repeat myeloma panel.  MEDICAL HISTORY: Past Medical History:  Diagnosis Date  . Allergy   . Anxiety   . Bone cancer (HCC)    t_spine T-5  . Compression fracture   . Encounter for antineoplastic chemotherapy 01/31/2015  . Hearing loss   . Melanoma (HCC)   . Multiple myeloma (HCC)   . Prostate  cancer (HCC) 2002  . S/P radiation therapy 08/21/14-09/04/14   T4-6 25Gy/10fx  . S/P radiation therapy 11/29/14-12/12/14   rt prox humerus/shoulder/scapula 25Gy/10fx  . Skin cancer 1994   melanoma  right neck   . Stroke (HCC)   . Thyroid disease     ALLERGIES:  is allergic to iodine.  MEDICATIONS:  Current Outpatient Medications  Medication Sig Dispense Refill  . aspirin 81 MG EC tablet Take 81 mg by mouth daily.  0  . Calcium Carbonate-Vitamin D (CALTRATE 600+D PO) Take 600 mg by mouth 2 (two) times daily.    . cholecalciferol (VITAMIN D) 1000 UNITS tablet Take 1,000 Units by mouth at bedtime.     . diphenoxylate-atropine (LOMOTIL) 2.5-0.025 MG tablet Take 2 tablets by mouth 4 (four) times daily as needed for diarrhea or loose stools. 30 tablet 0  . furosemide (LASIX) 20 MG tablet Take 20 mg by mouth daily. Pt takes 1 tablet daily except on Mon and Thurs takes 2 tablets    . KLOR-CON 10 10 MEQ tablet Take 10 mEq by mouth daily.    . levothyroxine (SYNTHROID, LEVOTHROID) 112 MCG tablet TK 1 T PO QD IN THE MORNING OES  6  . methocarbamol (ROBAXIN) 500 MG tablet TAKE 1 TABLET EVERY 6 HOURS AS NEEDED FOR MUSCLE SPASMS 180 tablet 4  . Multiple Vitamins-Minerals (CENTRUM SILVER PO) Take 1 tablet by mouth daily.    . omeprazole (PRILOSEC) 40 MG capsule Take 40 mg by mouth daily as needed (heartburn).     . oxybutynin (  DITROPAN) 5 MG tablet TK 1 T PO ONCE D    . sennosides-docusate sodium (SENOKOT-S) 8.6-50 MG tablet Take 1-2 tablets by mouth 2 (two) times daily as needed for constipation. Reported on 01/23/2016    . tamsulosin (FLOMAX) 0.4 MG CAPS capsule Take 1 capsule (0.4 mg total) by mouth at bedtime. 30 capsule 3  . TEXACORT 2.5 % SOLN APP TO THE SCALP HS PRN  2   No current facility-administered medications for this visit.   Facility-Administered Medications Ordered in Other Visits  Medication Dose Route Frequency Provider Last Rate Last Admin  . sodium chloride 0.9 % injection 10 mL  10  mL Intracatheter PRN , , MD        SURGICAL HISTORY:  Past Surgical History:  Procedure Laterality Date  . HERNIA REPAIR    . POSTERIOR CERVICAL FUSION/FORAMINOTOMY N/A 06/17/2014   Procedure: Thoracic five laminectomy for epidural tumor resection Thoracic 4-7 posterior lateral arthrodesis, segmental pedicle screw fixation.;  Surgeon: Kyle Cabbell, MD;  Location: [facility];  Service: Neurosurgery;  Laterality: N/A;    REVIEW OF SYSTEMS:  A comprehensive review of systems was negative except for: Constitutional: positive for fatigue Musculoskeletal: positive for arthralgias and muscle weakness Neurological: positive for gait problems   PHYSICAL EXAMINATION: General appearance: alert, cooperative, fatigued and no distress Head: Normocephalic, without obvious abnormality, atraumatic Neck: no adenopathy, no JVD, supple, symmetrical, trachea midline and thyroid not enlarged, symmetric, no tenderness/mass/nodules Lymph nodes: Cervical, supraclavicular, and axillary nodes normal. Resp: clear to auscultation bilaterally Back: symmetric, no curvature. ROM normal. No CVA tenderness. Cardio: regular rate and rhythm, S1, S2 normal, no murmur, click, rub or gallop GI: soft, non-tender; bowel sounds normal; no masses,  no organomegaly Extremities: extremities normal, atraumatic, no cyanosis or edema  ECOG PERFORMANCE STATUS: 1 - Symptomatic but completely ambulatory  Blood pressure 137/66, pulse 86, temperature 97.7 F (36.5 C), temperature source Temporal, resp. rate 18, height 5' 9" (1.753 m), weight 150 lb 14.4 oz (68.4 kg), SpO2 100 %.  LABORATORY DATA: Lab Results  Component Value Date   WBC 6.0 01/18/2020   HGB 12.8 (L) 01/18/2020   HCT 39.5 01/18/2020   MCV 94.5 01/18/2020   PLT 148 (L) 01/18/2020      Chemistry      Component Value Date/Time   NA 142 01/18/2020 1116   NA 141 07/15/2017 0949   K 4.3 01/18/2020 1116   K 4.4 07/15/2017 0949   CL 102 01/18/2020  1116   CO2 32 01/18/2020 1116   CO2 27 07/15/2017 0949   BUN 24 (H) 01/18/2020 1116   BUN 18.0 07/15/2017 0949   CREATININE 1.14 01/18/2020 1116   CREATININE 1.1 07/15/2017 0949      Component Value Date/Time   CALCIUM 9.6 01/18/2020 1116   CALCIUM 9.2 07/15/2017 0949   ALKPHOS 58 01/18/2020 1116   ALKPHOS 56 07/15/2017 0949   AST 20 01/18/2020 1116   AST 20 07/15/2017 0949   ALT 14 01/18/2020 1116   ALT 16 07/15/2017 0949   BILITOT 0.6 01/18/2020 1116   BILITOT 0.61 07/15/2017 0949       RADIOGRAPHIC STUDIES: No results found.  ASSESSMENT AND PLAN:  This is a very pleasant 86 years old white male with history of multiple myeloma diagnosed in November 2015 status post several chemotherapy regimens as well as radiation. He is currently on observation and feeling fine today with no concerning complaints except for the generalized fatigue and occasional incontinence to stool.    He has no urine incontinence. The patient had repeat myeloma panel performed recently.  I discussed the lab results with the patient.  His myeloma panel showed no concerning findings for progression. I recommended for him to continue on observation with repeat myeloma panel in 3 months. For the bone disease, he will continue his current treatment with Zometa. I will also arrange for the patient to have repeat skeletal bone survey to check for any evidence of disease progression especially in the lumbar spine.  He has metal in his body and he may not be a good candidate for MRI of the lumbar spine to evaluate the stool incontinence but he still have very good control of his urine. The patient was advised to call immediately if he has any concerning symptoms in the interval. The patient voices understanding of current disease status and treatment options and is in agreement with the current care plan.  All questions were answered. The patient knows to call the clinic with any problems, questions or concerns. We  can certainly see the patient much sooner if necessary.  Disclaimer: This note was dictated with voice recognition software. Similar sounding words can inadvertently be transcribed and may not be corrected upon review.       

## 2020-01-25 NOTE — Patient Instructions (Signed)
COVID-19 Vaccine Information can be found at: [email or web address] For questions related to vaccine distribution or appointments, please email [email or web address] or call [phone].   Zoledronic Acid injection (Hypercalcemia, Oncology) What is this medicine? ZOLEDRONIC ACID (ZOE le dron ik AS id) lowers the amount of calcium loss from bone. It is used to treat too much calcium in your blood from cancer. It is also used to prevent complications of cancer that has spread to the bone. This medicine may be used for other purposes; ask your health care provider or pharmacist if you have questions. COMMON BRAND NAME(S): Zometa What should I tell my health care provider before I take this medicine? They need to know if you have any of these conditions:  aspirin-sensitive asthma  cancer, especially if you are receiving medicines used to treat cancer  dental disease or wear dentures  infection  kidney disease  receiving corticosteroids like dexamethasone or prednisone  an unusual or allergic reaction to zoledronic acid, other medicines, foods, dyes, or preservatives  pregnant or trying to get pregnant  breast-feeding How should I use this medicine? This medicine is for infusion into a vein. It is given by a health care professional in a hospital or clinic setting. Talk to your pediatrician regarding the use of this medicine in children. Special care may be needed. Overdosage: If you think you have taken too much of this medicine contact a poison control center or emergency room at once. NOTE: This medicine is only for you. Do not share this medicine with others. What if I miss a dose? It is important not to miss your dose. Call your doctor or health care professional if you are unable to keep an appointment. What may interact with this medicine?  certain antibiotics given by injection  NSAIDs, medicines for pain and  inflammation, like ibuprofen or naproxen  some diuretics like bumetanide, furosemide  teriparatide  thalidomide This list may not describe all possible interactions. Give your health care provider a list of all the medicines, herbs, non-prescription drugs, or dietary supplements you use. Also tell them if you smoke, drink alcohol, or use illegal drugs. Some items may interact with your medicine. What should I watch for while using this medicine? Visit your doctor or health care professional for regular checkups. It may be some time before you see the benefit from this medicine. Do not stop taking your medicine unless your doctor tells you to. Your doctor may order blood tests or other tests to see how you are doing. Women should inform their doctor if they wish to become pregnant or think they might be pregnant. There is a potential for serious side effects to an unborn child. Talk to your health care professional or pharmacist for more information. You should make sure that you get enough calcium and vitamin D while you are taking this medicine. Discuss the foods you eat and the vitamins you take with your health care professional. Some people who take this medicine have severe bone, joint, and/or muscle pain. This medicine may also increase your risk for jaw problems or a broken thigh bone. Tell your doctor right away if you have severe pain in your jaw, bones, joints, or muscles. Tell your doctor if you have any pain that does not go away or that gets worse. Tell your dentist and dental surgeon that you are taking this medicine. You should not have major dental surgery while on this medicine. See your dentist to have a dental exam and fix any dental  problems before starting this medicine. Take good care of your teeth while on this medicine. Make sure you see your dentist for regular follow-up appointments. What side effects may I notice from receiving this medicine? Side effects that you should  report to your doctor or health care professional as soon as possible:  allergic reactions like skin rash, itching or hives, swelling of the face, lips, or tongue  anxiety, confusion, or depression  breathing problems  changes in vision  eye pain  feeling faint or lightheaded, falls  jaw pain, especially after dental work  mouth sores  muscle cramps, stiffness, or weakness  redness, blistering, peeling or loosening of the skin, including inside the mouth  trouble passing urine or change in the amount of urine Side effects that usually do not require medical attention (report to your doctor or health care professional if they continue or are bothersome):  bone, joint, or muscle pain  constipation  diarrhea  fever  hair loss  irritation at site where injected  loss of appetite  nausea, vomiting  stomach upset  trouble sleeping  trouble swallowing  weak or tired This list may not describe all possible side effects. Call your doctor for medical advice about side effects. You may report side effects to FDA at 1-800-FDA-1088. Where should I keep my medicine? This drug is given in a hospital or clinic and will not be stored at home. NOTE: This sheet is a summary. It may not cover all possible information. If you have questions about this medicine, talk to your doctor, pharmacist, or health care provider.  2020 Elsevier/Gold Standard (2013-12-17 14:19:39)  Coronavirus (COVID-19) Are you at risk?  Are you at risk for the Coronavirus (COVID-19)?  To be considered HIGH RISK for Coronavirus (COVID-19), you have to meet the following criteria:  . Traveled to [place], [place], [place], [place] or [place]; or in the [place] to [place], [place], [place], or [place]; and have fever, cough, and shortness of breath within the last 2 weeks of travel OR . Been in close contact with a person diagnosed with COVID-19 within the last 2 weeks and have fever, cough, and  shortness of breath . IF YOU DO NOT MEET THESE CRITERIA, YOU ARE CONSIDERED LOW RISK FOR COVID-19.  What to do if you are HIGH RISK for COVID-19?  Marland Kitchen If you are having a medical emergency, call 911. . Seek medical care right away. Before you go to a doctor's office, urgent care or emergency department, call ahead and tell them about your recent travel, contact with someone diagnosed with COVID-19, and your symptoms. You should receive instructions from your physician's office regarding next steps of care.  . When you arrive at healthcare provider, tell the healthcare staff immediately you have returned from visiting [place], [place], [place], [place] or [place]; or traveled in the [place] to [place], [place], [place], or [place]; in the last two weeks or you have been in close contact with a person diagnosed with COVID-19 in the last 2 weeks.   . Tell the health care staff about your symptoms: fever, cough and shortness of breath. . After you have been seen by a medical provider, you will be either: o Tested for (COVID-19) and discharged home on quarantine except to seek medical care if symptoms worsen, and asked to  - Stay home and avoid contact with others until you get your results (4-5 days)  - Avoid travel on public transportation if  possible (such as bus, train, or airplane) or o Sent to the Emergency Department by EMS for evaluation, COVID-19 testing, and possible admission depending on your condition and test results.  What to do if you are LOW RISK for COVID-19?  Reduce your risk of any infection by using the same precautions used for avoiding the common cold or flu:  Marland Kitchen Wash your hands often with soap and warm water for at least 20 seconds.  If soap and water are not readily available, use an alcohol-based hand sanitizer with at least 60% alcohol.  . If coughing or sneezing, cover your mouth and nose by coughing or sneezing into the elbow areas of your shirt or coat, into a  tissue or into your sleeve (not your hands). . Avoid shaking hands with others and consider head nods or verbal greetings only. . Avoid touching your eyes, nose, or mouth with unwashed hands.  . Avoid close contact with people who are sick. . Avoid places or events with large numbers of people in one location, like concerts or sporting events. . Carefully consider travel plans you have or are making. . If you are planning any travel outside or inside the [place], visit the CDC's Travelers' Health webpage for the latest health notices. . If you have some symptoms but not all symptoms, continue to monitor at home and seek medical attention if your symptoms worsen. . If you are having a medical emergency, call 911.   [place] / e-Visit: [email or web address]         [facility]: [facility]: [phone]                   [facility]: [phone]

## 2020-01-26 ENCOUNTER — Ambulatory Visit: Payer: Medicare Other

## 2020-01-26 ENCOUNTER — Ambulatory Visit: Payer: Medicare Other | Admitting: Internal Medicine

## 2020-02-01 ENCOUNTER — Ambulatory Visit (HOSPITAL_COMMUNITY)
Admission: RE | Admit: 2020-02-01 | Discharge: 2020-02-01 | Disposition: A | Discharge status: Home / Self Care | Payer: Medicare Other | Source: Ambulatory Visit | Attending: Internal Medicine | Admitting: Internal Medicine

## 2020-02-01 ENCOUNTER — Other Ambulatory Visit: Payer: Self-pay

## 2020-02-01 DIAGNOSIS — C9000 Multiple myeloma not having achieved remission: Secondary | ICD-10-CM | POA: Diagnosis not present

## 2020-03-29 ENCOUNTER — Emergency Department (HOSPITAL_COMMUNITY): Payer: Medicare Other

## 2020-03-29 ENCOUNTER — Emergency Department (HOSPITAL_COMMUNITY)
Admission: EM | Admit: 2020-03-29 | Discharge: 2020-03-30 | Disposition: A | Discharge status: Home / Self Care | Payer: Medicare Other | Attending: Emergency Medicine | Admitting: Emergency Medicine

## 2020-03-29 ENCOUNTER — Encounter (HOSPITAL_COMMUNITY): Payer: Self-pay

## 2020-03-29 ENCOUNTER — Other Ambulatory Visit: Payer: Self-pay

## 2020-03-29 DIAGNOSIS — Z7982 Long term (current) use of aspirin: Secondary | ICD-10-CM | POA: Insufficient documentation

## 2020-03-29 DIAGNOSIS — Y93E9 Activity, other interior property and clothing maintenance: Secondary | ICD-10-CM | POA: Insufficient documentation

## 2020-03-29 DIAGNOSIS — M4319 Spondylolisthesis, multiple sites in spine: Secondary | ICD-10-CM | POA: Diagnosis not present

## 2020-03-29 DIAGNOSIS — S0990XA Unspecified injury of head, initial encounter: Secondary | ICD-10-CM | POA: Diagnosis not present

## 2020-03-29 DIAGNOSIS — I6381 Other cerebral infarction due to occlusion or stenosis of small artery: Secondary | ICD-10-CM | POA: Diagnosis not present

## 2020-03-29 DIAGNOSIS — S0101XA Laceration without foreign body of scalp, initial encounter: Secondary | ICD-10-CM | POA: Insufficient documentation

## 2020-03-29 DIAGNOSIS — C439 Malignant melanoma of skin, unspecified: Secondary | ICD-10-CM | POA: Insufficient documentation

## 2020-03-29 DIAGNOSIS — L821 Other seborrheic keratosis: Secondary | ICD-10-CM | POA: Diagnosis not present

## 2020-03-29 DIAGNOSIS — Z23 Encounter for immunization: Secondary | ICD-10-CM | POA: Diagnosis not present

## 2020-03-29 DIAGNOSIS — C61 Malignant neoplasm of prostate: Secondary | ICD-10-CM | POA: Diagnosis not present

## 2020-03-29 DIAGNOSIS — S199XXA Unspecified injury of neck, initial encounter: Secondary | ICD-10-CM | POA: Diagnosis not present

## 2020-03-29 DIAGNOSIS — Z923 Personal history of irradiation: Secondary | ICD-10-CM | POA: Insufficient documentation

## 2020-03-29 DIAGNOSIS — C412 Malignant neoplasm of vertebral column: Secondary | ICD-10-CM | POA: Insufficient documentation

## 2020-03-29 DIAGNOSIS — S0003XA Contusion of scalp, initial encounter: Secondary | ICD-10-CM | POA: Diagnosis not present

## 2020-03-29 DIAGNOSIS — C7951 Secondary malignant neoplasm of bone: Secondary | ICD-10-CM | POA: Diagnosis not present

## 2020-03-29 DIAGNOSIS — Z7901 Long term (current) use of anticoagulants: Secondary | ICD-10-CM | POA: Insufficient documentation

## 2020-03-29 DIAGNOSIS — Z79899 Other long term (current) drug therapy: Secondary | ICD-10-CM | POA: Insufficient documentation

## 2020-03-29 DIAGNOSIS — Z85820 Personal history of malignant melanoma of skin: Secondary | ICD-10-CM | POA: Diagnosis not present

## 2020-03-29 DIAGNOSIS — S0000XA Unspecified superficial injury of scalp, initial encounter: Secondary | ICD-10-CM | POA: Diagnosis present

## 2020-03-29 DIAGNOSIS — Z85828 Personal history of other malignant neoplasm of skin: Secondary | ICD-10-CM | POA: Diagnosis not present

## 2020-03-29 DIAGNOSIS — R58 Hemorrhage, not elsewhere classified: Secondary | ICD-10-CM | POA: Diagnosis not present

## 2020-03-29 DIAGNOSIS — W19XXXA Unspecified fall, initial encounter: Secondary | ICD-10-CM | POA: Insufficient documentation

## 2020-03-29 DIAGNOSIS — Y998 Other external cause status: Secondary | ICD-10-CM | POA: Insufficient documentation

## 2020-03-29 DIAGNOSIS — J984 Other disorders of lung: Secondary | ICD-10-CM | POA: Diagnosis not present

## 2020-03-29 DIAGNOSIS — Y92003 Bedroom of unspecified non-institutional (private) residence as the place of occurrence of the external cause: Secondary | ICD-10-CM | POA: Diagnosis not present

## 2020-03-29 DIAGNOSIS — L570 Actinic keratosis: Secondary | ICD-10-CM | POA: Diagnosis not present

## 2020-03-29 DIAGNOSIS — G319 Degenerative disease of nervous system, unspecified: Secondary | ICD-10-CM | POA: Diagnosis not present

## 2020-03-29 DIAGNOSIS — D1801 Hemangioma of skin and subcutaneous tissue: Secondary | ICD-10-CM | POA: Diagnosis not present

## 2020-03-29 DIAGNOSIS — M47812 Spondylosis without myelopathy or radiculopathy, cervical region: Secondary | ICD-10-CM | POA: Diagnosis not present

## 2020-03-29 DIAGNOSIS — I6782 Cerebral ischemia: Secondary | ICD-10-CM | POA: Diagnosis not present

## 2020-03-29 MED ORDER — TETANUS-DIPHTH-ACELL PERTUSSIS 5-2.5-18.5 LF-MCG/0.5 IM SUSP
0.5000 mL | Freq: Once | INTRAMUSCULAR | Status: AC
  Administered 2020-03-29: 0.5 mL via INTRAMUSCULAR
  Filled 2020-03-29: qty 0.5

## 2020-03-29 NOTE — ED Triage Notes (Signed)
Patient in from home, attempted to sit in chair miscalculated and fell, believes he hit his head on the hinge of the door, suffered a 1.5in lac on head per EMS bleeding controlled at this time, no blood thinners, no distress noted, vitals wnl

## 2020-03-29 NOTE — ED Provider Notes (Signed)
[facility] DEPT Provider Note   CSN: [id number] Arrival date & time: 03/29/20  2202     History Chief Complaint  Patient presents with  . Fall    Jeremiah Owens is a 86 y.o. male.  HPI He presents for evaluation of injuries from fall.  He states that he was standing in his bedroom, changing clothes when he took 3 steps backwards and fell hitting the side of his head on the floor.  He was unable to get up without assistance.  He does not think he lost consciousness.  His wife was close and after hearing fall and called EMS who transferred him here.  He complains of mild pain in the head, and his left elbow.  He had cryotherapy done on a rash of his legs, today at his dermatologist office.  He denies recent fever, chills, nausea, vomiting, difficulty eating or change in his gait.  He uses a walker when he goes outside and sometimes when he is at his home and other times does not use a walker.  There are no other known modifying factors.    Past Medical History:  Diagnosis Date  . Allergy   . Anxiety   . Bone cancer ([place])    t_spine T-5  . Compression fracture   . Encounter for antineoplastic chemotherapy 01/31/2015  . Hearing loss   . Melanoma ([place])   . Multiple myeloma ([place])   . Prostate cancer ([place]) 2002  . S/P radiation therapy 08/21/14-09/04/14   T4-6 25Gy/4f  . S/P radiation therapy 11/29/14-12/12/14   rt prox humerus/shoulder/scapula 25Gy/133f . Skin cancer 1994   melanoma  right neck   . Stroke ([facility]  . Thyroid disease     Patient Active Problem List   Diagnosis Date Noted  . Multiple myeloma not having achieved remission ([facility]CH[facility]03/22/2017  . Dysphagia 07/10/2015  . Diarrhea 04/25/2015  . Cancer associated pain 04/25/2015  . Long term current use of anticoagulant therapy 04/25/2015  . Basal ganglia infarction ([facility]07/06/2015  . Dizziness   . Expressive aphasia   . Past pointing   . Right leg weakness   . Acute ischemic stroke  ([facility]07/05/2015  . CVA (cerebral infarction) 02/11/2015  . Aspiration pneumonia ([facility]07/05/2015  . Aphasia   . Difficulty speaking   . Speech difficult to understand   . Stroke ([facility]  . Encounter for antineoplastic chemotherapy 01/31/2015  . Constipation 12/20/2014  . Rash 12/20/2014  . Peripheral edema 12/06/2014  . Gait disorder 09/05/2014  . Multiple myeloma ([facility]CR[place]  . Paraplegia ([facility]11/18/2015  . Postoperative anemia due to acute blood loss 06/21/2014  . 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 Spine metastasis ([facility]11/14/2015  . Thoracic spine tumor 06/17/2014  . Metastatic bone cancer ([facility]11/07/2014    Past Surgical History:  Procedure Laterality Date  . HERNIA REPAIR    . POSTERIOR CERVICAL FUSION/FORAMINOTOMY N/A 06/17/2014   Procedure: Thoracic five laminectomy for epidural tumor resection Thoracic 4-7 posterior lateral arthrodesis, segmental pedicle screw fixation.;  Surgeon: KyAshok PallMD;  [facility];  Service: Neurosurgery;  Laterality: N/A;       Family History  Problem Relation Age of Onset  . Cancer Mother        breast  . Stroke Mother     Social History   Tobacco Use  . Smoking status: Never Smoker  . Smokeless tobacco: Never Used  Vaping Use  . Vaping Use: Never  used  Substance Use Topics  . Alcohol use: No    Alcohol/week: 0.0 standard drinks  . Drug use: No    Home Medications Prior to Admission medications   Medication Sig Start Date End Date Taking? Authorizing Provider  aspirin 81 MG EC tablet Take 81 mg by mouth daily. 03/29/15   [provider]  Calcium Carbonate-Vitamin D (CALTRATE 600+D PO) Take 600 mg by mouth 2 (two) times daily.    [provider]  cholecalciferol (VITAMIN D) 1000 UNITS tablet Take 1,000 Units by mouth at bedtime.     [provider]  diphenoxylate-atropine (LOMOTIL) 2.5-0.025 MG tablet Take 2 tablets by mouth 4 (four) times daily as needed for diarrhea or loose  stools. 04/01/16   Curt Bears, MD  furosemide (LASIX) 20 MG tablet Take 20 mg by mouth daily. Pt takes 1 tablet daily except on Mon and Thurs takes 2 tablets    [provider]  KLOR-CON 10 10 MEQ tablet Take 10 mEq by mouth daily. 03/21/15   [provider]  levothyroxine (SYNTHROID, LEVOTHROID) 112 MCG tablet TK 1 T PO QD IN THE MORNING OES 06/22/17   [provider]  methocarbamol (ROBAXIN) 500 MG tablet TAKE 1 TABLET EVERY 6 HOURS AS NEEDED FOR MUSCLE SPASMS 03/31/18   Meredith Staggers, MD  Multiple Vitamins-Minerals (CENTRUM SILVER PO) Take 1 tablet by mouth daily.    [provider]  omeprazole (PRILOSEC) 40 MG capsule Take 40 mg by mouth daily as needed (heartburn).     [provider]  oxybutynin (DITROPAN) 5 MG tablet TK 1 T PO ONCE D 03/15/19   [provider]  sennosides-docusate sodium (SENOKOT-S) 8.6-50 MG tablet Take 1-2 tablets by mouth 2 (two) times daily as needed for constipation. Reported on 01/23/2016    [provider]  tamsulosin (FLOMAX) 0.4 MG CAPS capsule Take 1 capsule (0.4 mg total) by mouth at bedtime. 02/21/15   Angiulli, Lavon Paganini, PA-C  TEXACORT 2.5 % SOLN APP TO THE SCALP HS PRN 07/02/17   [provider]    Allergies    Iodine  Review of Systems   Review of Systems  All other systems reviewed and are negative.   Physical Exam Updated Vital Signs BP 127/70 (BP Location: Right Arm)   Pulse 87   Temp 98.5 F (36.9 C) (Oral)   Resp 18   SpO2 99%   Physical Exam Vitals and nursing note reviewed.  Constitutional:      General: He is not in acute distress.    Appearance: He is well-developed. He is not ill-appearing, toxic-appearing or diaphoretic.  HENT:     Head: Normocephalic.     Comments: Contusion and laceration, left parietal.  No associated crepitation or deformity.    Right Ear: External ear normal.     Left Ear: External ear normal.  Eyes:     Conjunctiva/sclera:  Conjunctivae normal.     Pupils: Pupils are equal, round, and reactive to light.  Neck:     Trachea: Phonation normal.  Cardiovascular:     Rate and Rhythm: Normal rate and regular rhythm.     Heart sounds: Normal heart sounds.  Pulmonary:     Effort: Pulmonary effort is normal. No respiratory distress.     Breath sounds: Normal breath sounds. No stridor.  Chest:     Chest wall: No tenderness.  Abdominal:     General: There is no distension.     Tenderness: There is no  abdominal tenderness.  Musculoskeletal:        General: Normal range of motion.     Cervical back: Normal range of motion and neck supple.  Skin:    General: Skin is warm and dry.  Neurological:     Mental Status: He is alert and oriented to person, place, and time.     Cranial Nerves: No cranial nerve deficit.     Sensory: No sensory deficit.     Motor: No abnormal muscle tone.     Coordination: Coordination normal.  Psychiatric:        Mood and Affect: Mood normal.        Behavior: Behavior normal.     ED Results / Procedures / Treatments   Labs (all labs ordered are listed, but only abnormal results are displayed) Labs Reviewed - No data to display  EKG None  Radiology CT Head Wo Contrast  Result Date: 03/29/2020 CLINICAL DATA:  Fall at home. Fall attempting to sit in a chair striking head on hand of door. Head laceration. EXAM: CT HEAD WITHOUT CONTRAST TECHNIQUE: Contiguous axial images were obtained from the base of the skull through the vertex without intravenous contrast. COMPARISON:  Head CT 02/10/2015 FINDINGS: Brain: Slight progression in atrophy and chronic small vessel ischemia from prior exam. Remote lacunar infarct in the left basal ganglia. No intracranial hemorrhage, mass effect, or midline shift. No hydrocephalus. The basilar cisterns are patent. No evidence of territorial infarct or acute ischemia. No extra-axial or intracranial fluid collection. Vascular: Atherosclerosis of skullbase  vasculature without hyperdense vessel or abnormal calcification. Skull: No fracture or focal lesion. Sinuses/Orbits: No acute findings. Right knee mucosal thickening of ethmoid air cells. Bilateral cataract resection. The mastoid air cells are well aerated. Other: Small left parietal scalp hematoma with dressing in place. IMPRESSION: 1. Small left parietal scalp hematoma with dressing in place. No acute intracranial abnormality. No skull fracture. 2. Slight progression in atrophy and chronic small vessel ischemia from 2016. Remote lacunar infarct in the left basal ganglia. Electronically Signed   By: Keith Rake M.D.   On: 03/29/2020 23:38   CT Cervical Spine Wo Contrast  Result Date: 03/29/2020 CLINICAL DATA:  Polytrauma, critical, head/C-spine injury suspected Fall attempting to sit on chair striking head on hinge of door. EXAM: CT CERVICAL SPINE WITHOUT CONTRAST TECHNIQUE: Multidetector CT imaging of the cervical spine was performed without intravenous contrast. Multiplanar CT image reconstructions were also generated. COMPARISON:  CT 10/17/2014 FINDINGS: Alignment: Exaggerated cervical lordosis. Minimal anterolisthesis of C6 on C7 and C7 on T1, chronic and unchanged. No jumped or perched facets. Skull base and vertebrae: No acute fracture. Skull base and dens are intact. There are multiple lucent lesions including C7, T2, and right aspect of C1. These are stable from prior exam. Lesion within C3 on prior is not well demonstrated on the current exam. Previous tiny lesion in C4 is not well demonstrated. Soft tissues and spinal canal: No prevertebral fluid or swelling. No visible canal hematoma. Disc levels: Disc space narrowing and endplate spurring throughout, most prominent at C4-C5. There is advanced multilevel facet hypertrophy. Upper chest: Upper thoracic surgical hardware is partially included. Pleural calcifications/scarring. No acute findings. Patulous upper esophagus. Other: None. IMPRESSION: 1.  No acute fracture or subluxation of the cervical spine. 2. Multilevel degenerative disc disease and facet hypertrophy. Stable exaggerated cervical lordosis. 3. Lucent lesions in the cervical spine are grossly stable from 2016 or not as well seen and consistent with known history of myeloma.  Electronically Signed   By: Keith Rake M.D.   On: 03/29/2020 23:45    Procedures .Marland KitchenLaceration Repair  Date/Time: 03/29/2020 11:58 PM Performed by: Daleen Bo, MD Authorized by: Daleen Bo, MD   Consent:    Consent obtained:  Verbal   Consent given by:  Patient   Risks discussed:  Infection, pain, poor cosmetic result and poor wound healing   Alternatives discussed:  No treatment Anesthesia (see MAR for exact dosages):    Anesthesia method:  None Laceration details:    Location:  Scalp   Length (cm):  3.5   Depth (mm):  9 Repair type:    Repair type:  Simple Pre-procedure details:    Preparation:  Imaging obtained to evaluate for foreign bodies and patient was prepped and draped in usual sterile fashion Exploration:    Hemostasis achieved with:  Direct pressure   Wound exploration: wound explored through full range of motion     Wound extent: no fascia violation noted, no foreign bodies/material noted, no muscle damage noted, no underlying fracture noted and no vascular damage noted     Contaminated: no   Treatment:    Area cleansed with:  Betadine   Amount of cleaning:  Standard Skin repair:    Repair method:  Staples   Number of staples:  2 Approximation:    Approximation:  Loose Post-procedure details:    Dressing:  Sterile dressing and bulky dressing   Patient tolerance of procedure:  Tolerated well, no immediate complications   (including critical care time)  Medications Ordered in ED Medications  Tdap (BOOSTRIX) injection 0.5 mL (0.5 mLs Intramuscular Given 03/29/20 2245)    ED Course  I have reviewed the triage vital signs and the nursing notes.  Pertinent labs  & imaging results that were available during my care of the patient were reviewed by me and considered in my medical decision making (see chart for details).  Clinical Course as of Mar 30 1  Thu Mar 29, 2020  2357 Per radiologist, CT head and CT cervical spine are without acute abnormalities or injuries.   [EW]    Clinical Course User Index [EW] Daleen Bo, MD   MDM Rules/Calculators/A&P                           Patient Vitals for the past 24 hrs:  BP Temp Temp src Pulse Resp SpO2  03/29/20 2216 127/70 98.5 F (36.9 C) Oral 87 18 99 %    12:02 AM Reevaluation with update and discussion. After initial assessment and treatment, an updated evaluation reveals no change in clinical status, findings discussed with the patient and all questions were answered. Daleen Bo   Medical Decision Making:  This patient is presenting for evaluation of injuries from fall, which does require a range of treatment options, and is a complaint that involves a high risk of morbidity and mortality. The differential diagnoses include intracranial injury, spine injury, laceration. I decided to review old records, and in summary elderly male, with gait disorder, fell while changing his close tonight..  I did not require additional historical information from anyone.  Radiologic Tests Ordered, included CT head, CT cervical spine.  I independently Visualized: Radiographic images, which show no acute abnormalities   Critical Interventions-clinical evaluation, radiographic imaging, observation, reassessment, laceration repair  After These Interventions, the Patient was reevaluated and was found stable for discharge.  No acute intracranial injury or cervical spine injury.  Laceration  stapled, with good approximation and control of bleeding.  Patient with mechanical fall, without ongoing symptoms and reassuring physical exam and vital signs.  He is stable for discharge.  CRITICAL CARE-no Performed by:  Daleen Bo  Nursing Notes Reviewed/ Care Coordinated Applicable Imaging Reviewed Interpretation of Laboratory Data incorporated into ED treatment  The patient appears reasonably screened and/or stabilized for discharge and I doubt any other medical condition or other [facility] requiring further screening, evaluation, or treatment in the ED at this time prior to discharge.  Plan: Home Medications-continue usual, use Tylenol for pain; Home Treatments-wound care at home; return here if the recommended treatment, does not improve the symptoms; Recommended follow up-suture removal 7 days, return here sooner if needed for problems.     Final Clinical Impression(s) / ED Diagnoses Final diagnoses:  Laceration of scalp, initial encounter  Injury of head, initial encounter  Osteoarthritis of cervical spine, unspecified spinal osteoarthritis complication status    Rx / DC Orders ED Discharge Orders    None       Daleen Bo, MD 03/30/20 0002

## 2020-03-30 NOTE — Discharge Instructions (Addendum)
There were no serious injuries found, as a result of your fall.  Your laceration was stapled, they will need to be removed in 1 week.  Keep the wound clean with soap and water daily as you find yourself.  The wound may bleed or drain a little bit so keep it covered if that is causing problems.  For pain, use Tylenol.  Return here if you have signs or symptoms of head injury including weakness, dizziness, headache, blurred vision, nausea or vomiting.

## 2020-04-06 DIAGNOSIS — Z4802 Encounter for removal of sutures: Secondary | ICD-10-CM | POA: Diagnosis not present

## 2020-04-12 ENCOUNTER — Encounter: Payer: Self-pay | Admitting: Physical Medicine & Rehabilitation

## 2020-04-18 ENCOUNTER — Other Ambulatory Visit: Payer: Self-pay

## 2020-04-18 ENCOUNTER — Inpatient Hospital Stay: Payer: Medicare Other | Attending: Internal Medicine

## 2020-04-18 DIAGNOSIS — Z8546 Personal history of malignant neoplasm of prostate: Secondary | ICD-10-CM | POA: Insufficient documentation

## 2020-04-18 DIAGNOSIS — Z85820 Personal history of malignant melanoma of skin: Secondary | ICD-10-CM | POA: Insufficient documentation

## 2020-04-18 DIAGNOSIS — C9000 Multiple myeloma not having achieved remission: Secondary | ICD-10-CM | POA: Insufficient documentation

## 2020-04-18 DIAGNOSIS — R197 Diarrhea, unspecified: Secondary | ICD-10-CM | POA: Diagnosis not present

## 2020-04-18 DIAGNOSIS — C7951 Secondary malignant neoplasm of bone: Secondary | ICD-10-CM | POA: Insufficient documentation

## 2020-04-18 DIAGNOSIS — Z8673 Personal history of transient ischemic attack (TIA), and cerebral infarction without residual deficits: Secondary | ICD-10-CM | POA: Insufficient documentation

## 2020-04-18 DIAGNOSIS — Z85828 Personal history of other malignant neoplasm of skin: Secondary | ICD-10-CM | POA: Diagnosis not present

## 2020-04-18 DIAGNOSIS — Z923 Personal history of irradiation: Secondary | ICD-10-CM | POA: Insufficient documentation

## 2020-04-18 DIAGNOSIS — R5383 Other fatigue: Secondary | ICD-10-CM | POA: Insufficient documentation

## 2020-04-18 LAB — CBC WITH DIFFERENTIAL (CANCER CENTER ONLY)
Abs Immature Granulocytes: 0.01 10*3/uL (ref 0.00–0.07)
Basophils Absolute: 0 10*3/uL (ref 0.0–0.1)
Basophils Relative: 0 %
Eosinophils Absolute: 0.1 10*3/uL (ref 0.0–0.5)
Eosinophils Relative: 3 %
HCT: 37.9 % — ABNORMAL LOW (ref 39.0–52.0)
Hemoglobin: 12.3 g/dL — ABNORMAL LOW (ref 13.0–17.0)
Immature Granulocytes: 0 %
Lymphocytes Relative: 13 %
Lymphs Abs: 0.7 10*3/uL (ref 0.7–4.0)
MCH: 30.9 pg (ref 26.0–34.0)
MCHC: 32.5 g/dL (ref 30.0–36.0)
MCV: 95.2 fL (ref 80.0–100.0)
Monocytes Absolute: 0.5 10*3/uL (ref 0.1–1.0)
Monocytes Relative: 10 %
Neutro Abs: 4.2 10*3/uL (ref 1.7–7.7)
Neutrophils Relative %: 74 %
Platelet Count: 153 10*3/uL (ref 150–400)
RBC: 3.98 MIL/uL — ABNORMAL LOW (ref 4.22–5.81)
RDW: 14 % (ref 11.5–15.5)
WBC Count: 5.6 10*3/uL (ref 4.0–10.5)
nRBC: 0 % (ref 0.0–0.2)

## 2020-04-18 LAB — LACTATE DEHYDROGENASE: LDH: 191 U/L (ref 98–192)

## 2020-04-19 LAB — IGG, IGA, IGM
IgA: 128 mg/dL (ref 61–437)
IgG (Immunoglobin G), Serum: 750 mg/dL (ref 603–1613)
IgM (Immunoglobulin M), Srm: 63 mg/dL (ref 15–143)

## 2020-04-19 LAB — KAPPA/LAMBDA LIGHT CHAINS
Kappa free light chain: 19.5 mg/L — ABNORMAL HIGH (ref 3.3–19.4)
Kappa, lambda light chain ratio: 1.7 — ABNORMAL HIGH (ref 0.26–1.65)
Lambda free light chains: 11.5 mg/L (ref 5.7–26.3)

## 2020-04-19 LAB — BETA 2 MICROGLOBULIN, SERUM: Beta-2 Microglobulin: 2.8 mg/L — ABNORMAL HIGH (ref 0.6–2.4)

## 2020-04-25 ENCOUNTER — Encounter: Payer: Self-pay | Admitting: Internal Medicine

## 2020-04-25 ENCOUNTER — Other Ambulatory Visit: Payer: Self-pay

## 2020-04-25 ENCOUNTER — Telehealth: Payer: Self-pay | Admitting: Internal Medicine

## 2020-04-25 ENCOUNTER — Inpatient Hospital Stay: Payer: Medicare Other

## 2020-04-25 ENCOUNTER — Inpatient Hospital Stay (HOSPITAL_BASED_OUTPATIENT_CLINIC_OR_DEPARTMENT_OTHER): Payer: Medicare Other | Admitting: Internal Medicine

## 2020-04-25 VITALS — BP 115/62 | HR 90 | Temp 98.0°F | Resp 17 | Ht 69.0 in | Wt 153.4 lb

## 2020-04-25 DIAGNOSIS — C9000 Multiple myeloma not having achieved remission: Secondary | ICD-10-CM

## 2020-04-25 DIAGNOSIS — R5383 Other fatigue: Secondary | ICD-10-CM | POA: Diagnosis not present

## 2020-04-25 DIAGNOSIS — Z85820 Personal history of malignant melanoma of skin: Secondary | ICD-10-CM | POA: Diagnosis not present

## 2020-04-25 DIAGNOSIS — Z8546 Personal history of malignant neoplasm of prostate: Secondary | ICD-10-CM | POA: Diagnosis not present

## 2020-04-25 DIAGNOSIS — R197 Diarrhea, unspecified: Secondary | ICD-10-CM | POA: Diagnosis not present

## 2020-04-25 DIAGNOSIS — C7951 Secondary malignant neoplasm of bone: Secondary | ICD-10-CM

## 2020-04-25 LAB — CMP (CANCER CENTER ONLY)
ALT: 17 U/L (ref 0–44)
AST: 23 U/L (ref 15–41)
Albumin: 3.8 g/dL (ref 3.5–5.0)
Alkaline Phosphatase: 69 U/L (ref 38–126)
Anion gap: 6 (ref 5–15)
BUN: 23 mg/dL (ref 8–23)
CO2: 33 mmol/L — ABNORMAL HIGH (ref 22–32)
Calcium: 9.5 mg/dL (ref 8.9–10.3)
Chloride: 100 mmol/L (ref 98–111)
Creatinine: 1.07 mg/dL (ref 0.61–1.24)
GFR, Est AFR Am: >60 mL/min (ref >60)
GFR, Estimated: >60 mL/min (ref >60)
Glucose, Bld: 101 mg/dL — ABNORMAL HIGH (ref 70–99)
Potassium: 4.6 mmol/L (ref 3.5–5.1)
Sodium: 139 mmol/L (ref 135–145)
Total Bilirubin: 0.6 mg/dL (ref 0.3–1.2)
Total Protein: 6.6 g/dL (ref 6.5–8.1)

## 2020-04-25 MED ORDER — ZOLEDRONIC ACID 4 MG/100ML IV SOLN
4.0000 mg | Freq: Once | INTRAVENOUS | Status: AC
  Administered 2020-04-25: 4 mg via INTRAVENOUS

## 2020-04-25 MED ORDER — SODIUM CHLORIDE 0.9 % IV SOLN
INTRAVENOUS | Status: DC
  Filled 2020-04-25: qty 250

## 2020-04-25 MED ORDER — ZOLEDRONIC ACID 4 MG/100ML IV SOLN
INTRAVENOUS | Status: AC
  Filled 2020-04-25: qty 100

## 2020-04-25 NOTE — Patient Instructions (Signed)
Zoledronic Acid injection (Hypercalcemia, Oncology) What is this medicine? ZOLEDRONIC ACID (ZOE le dron ik AS id) lowers the amount of calcium loss from bone. It is used to treat too much calcium in your blood from cancer. It is also used to prevent complications of cancer that has spread to the bone. This medicine may be used for other purposes; ask your health care provider or pharmacist if you have questions. COMMON BRAND NAME(S): Zometa What should I tell my health care provider before I take this medicine? They need to know if you have any of these conditions:  aspirin-sensitive asthma  cancer, especially if you are receiving medicines used to treat cancer  dental disease or wear dentures  infection  kidney disease  receiving corticosteroids like dexamethasone or prednisone  an unusual or allergic reaction to zoledronic acid, other medicines, foods, dyes, or preservatives  pregnant or trying to get pregnant  breast-feeding How should I use this medicine? This medicine is for infusion into a vein. It is given by a health care professional in a hospital or clinic setting. Talk to your pediatrician regarding the use of this medicine in children. Special care may be needed. Overdosage: If you think you have taken too much of this medicine contact a poison control center or emergency room at once. NOTE: This medicine is only for you. Do not share this medicine with others. What if I miss a dose? It is important not to miss your dose. Call your doctor or health care professional if you are unable to keep an appointment. What may interact with this medicine?  certain antibiotics given by injection  NSAIDs, medicines for pain and inflammation, like ibuprofen or naproxen  some diuretics like bumetanide, furosemide  teriparatide  thalidomide This list may not describe all possible interactions. Give your health care provider a list of all the medicines, herbs, non-prescription  drugs, or dietary supplements you use. Also tell them if you smoke, drink alcohol, or use illegal drugs. Some items may interact with your medicine. What should I watch for while using this medicine? Visit your doctor or health care professional for regular checkups. It may be some time before you see the benefit from this medicine. Do not stop taking your medicine unless your doctor tells you to. Your doctor may order blood tests or other tests to see how you are doing. Women should inform their doctor if they wish to become pregnant or think they might be pregnant. There is a potential for serious side effects to an unborn child. Talk to your health care professional or pharmacist for more information. You should make sure that you get enough calcium and vitamin D while you are taking this medicine. Discuss the foods you eat and the vitamins you take with your health care professional. Some people who take this medicine have severe bone, joint, and/or muscle pain. This medicine may also increase your risk for jaw problems or a broken thigh bone. Tell your doctor right away if you have severe pain in your jaw, bones, joints, or muscles. Tell your doctor if you have any pain that does not go away or that gets worse. Tell your dentist and dental surgeon that you are taking this medicine. You should not have major dental surgery while on this medicine. See your dentist to have a dental exam and fix any dental problems before starting this medicine. Take good care of your teeth while on this medicine. Make sure you see your dentist for regular follow-up   appointments. What side effects may I notice from receiving this medicine? Side effects that you should report to your doctor or health care professional as soon as possible:  allergic reactions like skin rash, itching or hives, swelling of the face, lips, or tongue  anxiety, confusion, or depression  breathing problems  changes in vision  eye  pain  feeling faint or lightheaded, falls  jaw pain, especially after dental work  mouth sores  muscle cramps, stiffness, or weakness  redness, blistering, peeling or loosening of the skin, including inside the mouth  trouble passing urine or change in the amount of urine Side effects that usually do not require medical attention (report to your doctor or health care professional if they continue or are bothersome):  bone, joint, or muscle pain  constipation  diarrhea  fever  hair loss  irritation at site where injected  loss of appetite  nausea, vomiting  stomach upset  trouble sleeping  trouble swallowing  weak or tired This list may not describe all possible side effects. Call your doctor for medical advice about side effects. You may report side effects to FDA at 1-800-FDA-1088. Where should I keep my medicine? This drug is given in a hospital or clinic and will not be stored at home. NOTE: This sheet is a summary. It may not cover all possible information. If you have questions about this medicine, talk to your doctor, pharmacist, or health care provider.  2020 Elsevier/Gold Standard (2013-12-17 14:19:39)  

## 2020-04-25 NOTE — Telephone Encounter (Signed)
Scheduled per 09/22 los, patient received calender.

## 2020-04-25 NOTE — Progress Notes (Signed)
[place] [phone]   [phone]  OFFICE PROGRESS NOTE  Wenda Low, MD [address] [place] [address]  DIAGNOSIS: Multiple myeloma diagnosed in November 2015.  PRIOR THERAPY: 1) Status post Thoracic five laminectomy for epidural tumor resection, Thoracic 4-7 posterior lateral arthrodesis,  segmental pedicle screw fixation T4-T7 Globus instrumentation under the care of Dr. Christella Noa on 06/18/2014. 2) Systemic chemotherapy with Velcade 1.3 MG/M2 subcutaneously weekly in addition to Decadron 40 mg by mouth on weekly basis. Status post 11 cycles. 3) palliative radiotherapy to the right proximal humerus, shoulder and scapula under the care of Dr. Lisbeth Renshaw completed on 12/12/2014. 4)  Systemic chemotherapy with Velcade 1.3 MG/M2 subcutaneously weekly, Decadron 40 mg by mouth weekly in addition to Revlimid 25 MG by mouth daily for 21 days every 4 weeks, status post 14 cycles.   CURRENT THERAPY: Zometa 4 mg IV every 12 weeks. First dose was given 11/01/2014.  INTERVAL HISTORY: Jeremiah Owens 86 y.o. male returns to the clinic today for follow-up visit accompanied by his wife.  The patient is feeling fine today with no concerning complaints except for the numbness in the right foot with weakness in this area.  He also has intermittent diarrhea and shortness of breath with exertion.  He denied having any chest pain, cough or hemoptysis.  He denied having any fever or chills.  He has no nausea, vomiting or constipation.  He is here today for evaluation with repeat myeloma panel.  MEDICAL HISTORY: Past Medical History:  Diagnosis Date  . Allergy   . Anxiety   . Bone cancer ([place])    t_spine T-5  . Compression fracture   . Encounter for antineoplastic chemotherapy 01/31/2015  . Hearing loss   . Melanoma ([place])   . Multiple myeloma ([place])   . Prostate cancer ([place]) 2002  . S/P radiation therapy 08/21/14-09/04/14   T4-6 25Gy/40f  . S/P  radiation therapy 11/29/14-12/12/14   rt prox humerus/shoulder/scapula 25Gy/139f . Skin cancer 1994   melanoma  right neck   . [place]  . Thyroid disease     ALLERGIES:  is allergic to iodine.  MEDICATIONS:  Current Outpatient Medications  Medication Sig Dispense Refill  . aspirin 81 MG EC tablet Take 81 mg by mouth daily.  0  . Calcium Carbonate-Vitamin D (CALTRATE 600+D PO) Take 600 mg by mouth 2 (two) times daily.    . cholecalciferol (VITAMIN D) 1000 UNITS tablet Take 1,000 Units by mouth at bedtime.     . diphenoxylate-atropine (LOMOTIL) 2.5-0.025 MG tablet Take 2 tablets by mouth 4 (four) times daily as needed for diarrhea or loose stools. 30 tablet 0  . furosemide (LASIX) 20 MG tablet Take 20 mg by mouth daily. Pt takes 1 tablet daily except on Mon and Thurs takes 2 tablets    . KLOR-CON 10 10 MEQ tablet Take 10 mEq by mouth daily.    . Marland Kitchenevothyroxine (SYNTHROID, LEVOTHROID) 112 MCG tablet TK 1 T PO QD IN THE MORNING OES  6  . methocarbamol (ROBAXIN) 500 MG tablet TAKE 1 TABLET EVERY 6 HOURS AS NEEDED FOR MUSCLE SPASMS 180 tablet 4  . Multiple Vitamins-Minerals (CENTRUM SILVER PO) Take 1 tablet by mouth daily.    . Marland Kitchenmeprazole (PRILOSEC) 40 MG capsule Take 40 mg by mouth daily as needed (heartburn).     . Marland Kitchenxybutynin (DITROPAN) 5 MG tablet TK 1 T PO ONCE D    . sennosides-docusate  sodium (SENOKOT-S) 8.6-50 MG tablet Take 1-2 tablets by mouth 2 (two) times daily as needed for constipation. Reported on 01/23/2016    . tamsulosin (FLOMAX) 0.4 MG CAPS capsule Take 1 capsule (0.4 mg total) by mouth at bedtime. 30 capsule 3  . TEXACORT 2.5 % SOLN APP TO THE SCALP HS PRN  2   No current facility-administered medications for this visit.   Facility-Administered Medications Ordered in Other Visits  Medication Dose Route Frequency Provider Last Rate Last Admin  . sodium chloride 0.9 % injection 10 mL  10 mL Intracatheter PRN Curt Bears, MD        SURGICAL HISTORY:  Past  Surgical History:  Procedure Laterality Date  . HERNIA REPAIR    . POSTERIOR CERVICAL FUSION/FORAMINOTOMY N/A 06/17/2014   Procedure: Thoracic five laminectomy for epidural tumor resection Thoracic 4-7 posterior lateral arthrodesis, segmental pedicle screw fixation.;  Surgeon: Ashok Pall, MD;  Location: [facility];  Service: Neurosurgery;  Laterality: N/A;    REVIEW OF SYSTEMS:  A comprehensive review of systems was negative except for: Constitutional: positive for fatigue Gastrointestinal: positive for diarrhea Musculoskeletal: positive for arthralgias and muscle weakness Neurological: positive for gait problems   PHYSICAL EXAMINATION: General appearance: alert, cooperative, fatigued and no distress Head: Normocephalic, without obvious abnormality, atraumatic Neck: no adenopathy, no JVD, supple, symmetrical, trachea midline and thyroid not enlarged, symmetric, no tenderness/mass/nodules Lymph nodes: Cervical, supraclavicular, and axillary nodes normal. Resp: clear to auscultation bilaterally Back: symmetric, no curvature. ROM normal. No CVA tenderness. Cardio: regular rate and rhythm, S1, S2 normal, no murmur, click, rub or gallop GI: soft, non-tender; bowel sounds normal; no masses,  no organomegaly Extremities: extremities normal, atraumatic, no cyanosis or edema  ECOG PERFORMANCE STATUS: 1 - Symptomatic but completely ambulatory  Blood pressure 115/62, pulse 90, temperature 98 F (36.7 C), temperature source Tympanic, resp. rate 17, height '5\' 9"'  (1.753 m), weight 153 lb 6.4 oz (69.6 kg), SpO2 100 %.  LABORATORY DATA: Lab Results  Component Value Date   WBC 5.6 04/18/2020   HGB 12.3 (L) 04/18/2020   HCT 37.9 (L) 04/18/2020   MCV 95.2 04/18/2020   PLT 153 04/18/2020      Chemistry      Component Value Date/Time   NA 142 01/18/2020 1116   NA 141 07/15/2017 0949   K 4.3 01/18/2020 1116   K 4.4 07/15/2017 0949   CL 102 01/18/2020 1116   CO2 32 01/18/2020 1116   CO2 27  07/15/2017 0949   BUN 24 (H) 01/18/2020 1116   BUN 18.0 07/15/2017 0949   CREATININE 1.14 01/18/2020 1116   CREATININE 1.1 07/15/2017 0949      Component Value Date/Time   CALCIUM 9.6 01/18/2020 1116   CALCIUM 9.2 07/15/2017 0949   ALKPHOS 58 01/18/2020 1116   ALKPHOS 56 07/15/2017 0949   AST 20 01/18/2020 1116   AST 20 07/15/2017 0949   ALT 14 01/18/2020 1116   ALT 16 07/15/2017 0949   BILITOT 0.6 01/18/2020 1116   BILITOT 0.61 07/15/2017 0949       RADIOGRAPHIC STUDIES: CT Head Wo Contrast  Result Date: 03/29/2020 CLINICAL DATA:  Fall at home. Fall attempting to sit in a chair striking head on hand of door. Head laceration. EXAM: CT HEAD WITHOUT CONTRAST TECHNIQUE: Contiguous axial images were obtained from the base of the skull through the vertex without intravenous contrast. COMPARISON:  Head CT 02/10/2015 FINDINGS: Brain: Slight progression in atrophy and chronic small vessel ischemia from prior exam.  Remote lacunar infarct in the left basal ganglia. No intracranial hemorrhage, mass effect, or midline shift. No hydrocephalus. The basilar cisterns are patent. No evidence of territorial infarct or acute ischemia. No extra-axial or intracranial fluid collection. Vascular: Atherosclerosis of skullbase vasculature without hyperdense vessel or abnormal calcification. Skull: No fracture or focal lesion. Sinuses/Orbits: No acute findings. Right knee mucosal thickening of ethmoid air cells. Bilateral cataract resection. The mastoid air cells are well aerated. Other: Small left parietal scalp hematoma with dressing in place. IMPRESSION: 1. Small left parietal scalp hematoma with dressing in place. No acute intracranial abnormality. No skull fracture. 2. Slight progression in atrophy and chronic small vessel ischemia from 2016. Remote lacunar infarct in the left basal ganglia. Electronically Signed   By: Keith Rake M.D.   On: 03/29/2020 23:38   CT Cervical Spine Wo Contrast  Result Date:  03/29/2020 CLINICAL DATA:  Polytrauma, critical, head/C-spine injury suspected Fall attempting to sit on chair striking head on hinge of door. EXAM: CT CERVICAL SPINE WITHOUT CONTRAST TECHNIQUE: Multidetector CT imaging of the cervical spine was performed without intravenous contrast. Multiplanar CT image reconstructions were also generated. COMPARISON:  CT 10/17/2014 FINDINGS: Alignment: Exaggerated cervical lordosis. Minimal anterolisthesis of C6 on C7 and C7 on T1, chronic and unchanged. No jumped or perched facets. Skull base and vertebrae: No acute fracture. Skull base and dens are intact. There are multiple lucent lesions including C7, T2, and right aspect of C1. These are stable from prior exam. Lesion within C3 on prior is not well demonstrated on the current exam. Previous tiny lesion in C4 is not well demonstrated. Soft tissues and spinal canal: No prevertebral fluid or swelling. No visible canal hematoma. Disc levels: Disc space narrowing and endplate spurring throughout, most prominent at C4-C5. There is advanced multilevel facet hypertrophy. Upper chest: Upper thoracic surgical hardware is partially included. Pleural calcifications/scarring. No acute findings. Patulous upper esophagus. Other: None. IMPRESSION: 1. No acute fracture or subluxation of the cervical spine. 2. Multilevel degenerative disc disease and facet hypertrophy. Stable exaggerated cervical lordosis. 3. Lucent lesions in the cervical spine are grossly stable from 2016 or not as well seen and consistent with known history of myeloma. Electronically Signed   By: Keith Rake M.D.   On: 03/29/2020 23:45    ASSESSMENT AND PLAN:  This is a very pleasant 86 years old white male with history of multiple myeloma diagnosed in November 2015 status post several chemotherapy regimens as well as radiation. The patient is currently on observation and he is feeling fine today with no concerning complaints. He had repeat myeloma panel that  showed no concerning findings for disease progression. I recommended for the patient to continue on observation with repeat myeloma panel in 6 months and follow-up visit at that time. For the bone disease from the multiple myeloma, he will continue on Zometa every 3 months. The patient was advised to call immediately if he has any concerning symptoms in the interval. The patient voices understanding of current disease status and treatment options and is in agreement with the current care plan.  All questions were answered. The patient knows to call the clinic with any problems, questions or concerns. We can certainly see the patient much sooner if necessary.  Disclaimer: This note was dictated with voice recognition software. Similar sounding words can inadvertently be transcribed and may not be corrected upon review.

## 2020-04-26 ENCOUNTER — Ambulatory Visit: Payer: Medicare Other | Admitting: Physician Assistant

## 2020-04-26 ENCOUNTER — Ambulatory Visit: Payer: Medicare Other

## 2020-05-02 ENCOUNTER — Encounter: Payer: Medicare Other | Attending: Physical Medicine & Rehabilitation | Admitting: Physical Medicine & Rehabilitation

## 2020-05-02 ENCOUNTER — Other Ambulatory Visit: Payer: Self-pay

## 2020-05-02 ENCOUNTER — Encounter: Payer: Self-pay | Admitting: Physical Medicine & Rehabilitation

## 2020-05-02 VITALS — BP 121/72 | HR 86 | Temp 97.7°F | Ht 69.0 in | Wt 154.0 lb

## 2020-05-02 DIAGNOSIS — R29898 Other symptoms and signs involving the musculoskeletal system: Secondary | ICD-10-CM | POA: Insufficient documentation

## 2020-05-02 DIAGNOSIS — Z8673 Personal history of transient ischemic attack (TIA), and cerebral infarction without residual deficits: Secondary | ICD-10-CM | POA: Diagnosis not present

## 2020-05-02 DIAGNOSIS — I69391 Dysphagia following cerebral infarction: Secondary | ICD-10-CM | POA: Insufficient documentation

## 2020-05-02 DIAGNOSIS — R2689 Other abnormalities of gait and mobility: Secondary | ICD-10-CM | POA: Diagnosis not present

## 2020-05-02 NOTE — Patient Instructions (Signed)
PLEASE FEEL FREE TO CALL OUR OFFICE WITH ANY PROBLEMS OR QUESTIONS ([phone])      

## 2020-05-02 NOTE — Progress Notes (Signed)
Subjective:    Patient ID: Jeremiah Owens, male    DOB: 09-Jun-1930, 86 y.o.   MRN: [id number]  HPI   This is an initial visit for a patient I last saw in 2017, [name]. He has been falling more over this past year. He reports that he has been "blanking out" on occasion.. The most recent fall was at night a few weeks ago, after he emptied his bowel and bladder. He got up to go to the bed and put his pajamas on, and he "went out".  He uses his rollator more often in the house as a result. He reports no changes in the strength of his right leg. He denies any excessive fatigue or problems sleeping. He uses robaxin sometimes at night to help him sleep. In general, Jeremiah Owens he feels that it's harder to change directions, and doesn't always know where his feet are in space. He typically goes slowly to accommodate his balance deficits and weakness. These balance issues seem to be the bigger and more common reason he falls (as opposed to the two "black out" episodes)  For exercise he does some stretching, walking, hand weights with a Zoom group 2 days a week. He also walks around the house.  His left shoulder ROM has worsened d/t RTC injury and adhesive capsulitis.   He follows Jeremiah Owens for management of his MM which is reportedly under control.    Pain Inventory Average Pain 2 Pain Right Now 0 My pain is tingling  LOCATION OF PAIN  Legs   BOWEL Number of stools per week: 7+ Oral laxative use Yes  Type of laxative lomotil Enema or suppository use No  History of colostomy No  Incontinent No   BLADDER Pads In and out cath, frequency n/a  Able to self cath No  Bladder incontinence Yes  Frequent urination Yes  Leakage with coughing No  Difficulty starting stream No  Incomplete bladder emptying No    Mobility walk with assistance use a walker how many minutes can you walk? 5-10 ability to climb steps?  no do you drive?  no  Function retired I need assistance with  the following:  meal prep, household duties and shopping  Neuro/Psych bladder control problems bowel control problems trouble walking spasms  Prior Studies new  Physicians involved in your care new   Family History  Problem Relation Age of Onset  . Cancer Mother        breast  . Stroke Mother    Social History   Socioeconomic History  . Marital status: Married    Spouse name: Not on file  . Number of children: Not on file  . Years of education: Not on file  . Highest education level: Not on file  Occupational History  . Not on file  Tobacco Use  . Smoking status: Never Smoker  . Smokeless tobacco: Never Used  Vaping Use  . Vaping Use: Never used  Substance and Sexual Activity  . Alcohol use: No    Alcohol/week: 0.0 standard drinks  . Drug use: No  . Sexual activity: Not on file  Other Topics Concern  . Not on file  Social History Narrative  . Not on file   Social Determinants of Health   Financial Resource Strain:   . Difficulty of Paying Living Expenses: Not on file  Food Insecurity:   . Worried About Charity fundraiser in the Last Year: Not on file  . Ran Out of  Food in the Last Year: Not on file  Transportation Needs:   . Lack of Transportation (Medical): Not on file  . Lack of Transportation (Non-Medical): Not on file  Physical Activity:   . Days of Exercise per Week: Not on file  . Minutes of Exercise per Session: Not on file  Stress:   . Feeling of Stress : Not on file  Social Connections:   . Frequency of Communication with Friends and Family: Not on file  . Frequency of Social Gatherings with Friends and Family: Not on file  . Attends Religious Services: Not on file  . Active Member of Clubs or Organizations: Not on file  . Attends Archivist Meetings: Not on file  . Marital Status: Not on file   Past Surgical History:  Procedure Laterality Date  . HERNIA REPAIR    . POSTERIOR CERVICAL FUSION/FORAMINOTOMY N/A 06/17/2014    Procedure: Thoracic five laminectomy for epidural tumor resection Thoracic 4-7 posterior lateral arthrodesis, segmental pedicle screw fixation.;  Surgeon: Ashok Pall, MD;  Location: [facility];  Service: Neurosurgery;  Laterality: N/A;   Past Medical History:  Diagnosis Date  . Allergy   . Anxiety   . Bone cancer ([place])    t_spine T-5  . Compression fracture   . Encounter for antineoplastic chemotherapy 01/31/2015  . Hearing loss   . Melanoma ([place])   . Multiple myeloma ([place])   . Prostate cancer ([place]) 2002  . S/P radiation therapy 08/21/14-09/04/14   T4-6 25Gy/40f  . S/P radiation therapy 11/29/14-12/12/14   rt prox humerus/shoulder/scapula 25Gy/163f . Skin cancer 1994   melanoma  right neck   . [place]  . Thyroid disease    BP 121/72   Pulse 86   Temp 97.7 F (36.5 C)   Ht 5' 9" (1.753 m)   Wt 154 lb (69.9 kg)   SpO2 98%   BMI 22.74 kg/m   Opioid Risk Score:   Fall Risk Score:  `1  Depression screen PHQ 2/9  Depression screen PHQ 2/9 04/10/2015  Decreased Interest 0  Down, Depressed, Hopeless 0  PHQ - 2 Score 0  Altered sleeping 1  Tired, decreased energy 1  Change in appetite 0  Feeling bad or failure about yourself  0  Trouble concentrating 0  Moving slowly or fidgety/restless 0  Suicidal thoughts 0  PHQ-9 Score 2  Some recent data might be hidden   Review of Systems  Constitutional: Negative.   HENT: Negative.   Eyes: Negative.   Respiratory: Positive for shortness of breath.   Cardiovascular: Negative.   Gastrointestinal: Negative.   Endocrine: Negative.   Genitourinary:       Incontinence   Musculoskeletal: Positive for gait problem.  Skin: Negative for rash.  Allergic/Immunologic: Negative.   Neurological: Positive for dizziness.       Balance issues   Hematological: Negative.   Psychiatric/Behavioral: Negative.   All other systems reviewed and are negative.      Objective:   Physical Exam  Gen: no distress, normal  appearing HEENT: oral mucosa pink and moist, NCAT Cardio: Reg rate Chest: normal effort, normal rate of breathing Abd: soft, non-distended Ext: no edema Skin: intact Neuro: Alert and oriented x 3. Normal insight and awareness. Intact Memory. Normal language and speech. Cranial nerve exam unremarkable, dysarthric, wet voice quality.  Strength is grossly 5 out of 5 in the upper extremities with fine motor tremor.  Sensory exams intact.  Lower extremity strength is  notable for 4 out of 5 in the right lower extremity 4+ to 5 out of 5 in left lower extremity with no gross sensory abnormalities.  He did have slight intentional tremor noted.  With gait patient tends to slide right foot an effort to clear the leg in swing phase.  Has fair right knee bend.  Left leg with normal clearance.  Does well with his rolling walker.  Needs extra time for change of directions.  Decreased fine motor coordination right greater than left. Musculoskeletal: head forward posture, significant thoracic kyphosis.  Left shoulder limited by adhesive capsulitis with decreased abduction as well as external and internal rotation.  Both shoe external soles are worn right more than left almost through to the insole. Psych: pleasant, normal affect       Assessment & Plan:  1.  History of left basal ganglia infarct with right hemiparesis 2.  History of multiple myeloma with thoracic spinal mets 3.  History of BPH 4. Dysphagia d/t CVA 5.  Multifactorial gait disorder related to the above in addition to his significant kyphotic posture and adhesive capsulitis in the left shoulder.  It seems as if he has taken some steps backwards over this past year or so as his physical activity decrease during Covid.   Plan: 1.  Made referral to outpatient physical therapy at neuro rehab to address gait mechanics and to help improve, in particular in, his swing phase on the right lower extremity.  Might benefit from a kick plate to the right shoe  as well.  Also will dress posture in sitting and standing as this is affecting his center of gravity. 2.  Made referral to outpatient speech-language pathology to assess his ongoing dysphagia, particularly for liquids.  Might benefit from a modified barium swallow but I will leave that up to the speech therapist.  Patient 3.  Recommended new pair of shoes as his current pair is worn beyond repair 4. "syncopal" episodes per primary. Encouraged ade him in quate fluids, use of walker at all times, time to acclimate before ambulating after being seated or lying for a prolonged period   30 minutes was spent with the patient and his wife and reviewing his history and discussing a custom treatment plan.  I will see him back in about 2 months time. I thank Dr. Deforest Hoyles kindly for this referral.

## 2020-05-04 ENCOUNTER — Telehealth: Payer: Self-pay

## 2020-05-04 DIAGNOSIS — R1312 Dysphagia, oropharyngeal phase: Secondary | ICD-10-CM

## 2020-05-04 NOTE — Telephone Encounter (Signed)
Order in for [facility] at [facility].

## 2020-05-04 NOTE — Telephone Encounter (Signed)
Notified outpt neuro rehab.

## 2020-05-04 NOTE — Telephone Encounter (Signed)
Out patient rehab needs the order placed for Jeremiah Owens Modified Barium Study. He will need it before Speech Therapy starts.  Please place the order.   Call back ph# [phone].

## 2020-05-04 NOTE — Addendum Note (Signed)
Addended by: [name] T on: 05/04/2020 02:50 PM   Modules accepted: Orders

## 2020-05-07 ENCOUNTER — Ambulatory Visit: Payer: Medicare Other | Attending: Physical Medicine & Rehabilitation

## 2020-05-07 ENCOUNTER — Other Ambulatory Visit: Payer: Self-pay

## 2020-05-07 DIAGNOSIS — M6281 Muscle weakness (generalized): Secondary | ICD-10-CM | POA: Diagnosis not present

## 2020-05-07 DIAGNOSIS — R293 Abnormal posture: Secondary | ICD-10-CM | POA: Insufficient documentation

## 2020-05-07 DIAGNOSIS — R296 Repeated falls: Secondary | ICD-10-CM | POA: Diagnosis not present

## 2020-05-07 DIAGNOSIS — R2689 Other abnormalities of gait and mobility: Secondary | ICD-10-CM | POA: Diagnosis not present

## 2020-05-07 DIAGNOSIS — R2681 Unsteadiness on feet: Secondary | ICD-10-CM | POA: Insufficient documentation

## 2020-05-07 DIAGNOSIS — R1312 Dysphagia, oropharyngeal phase: Secondary | ICD-10-CM | POA: Diagnosis not present

## 2020-05-07 DIAGNOSIS — R1313 Dysphagia, pharyngeal phase: Secondary | ICD-10-CM | POA: Insufficient documentation

## 2020-05-07 NOTE — Therapy (Signed)
[facility] [address] [place], [place], [address] Phone: [phone]   Fax:  [phone]  Physical Therapy Evaluation  Patient Details  Name: Jeremiah Owens MRN: [id number] Date of Birth: 30-Jun-1930 Referring Provider (PT): [name], MD   Encounter Date: 05/07/2020   PT End of Session - 05/07/20 1401    Visit Number 1    Number of Visits 17    Date for PT Re-Evaluation 08/05/20   POC for 8 weeks, Cert for 90 days   Authorization Type Medicare    Progress Note Due on Visit 10    PT Start Time 1316    PT Stop Time 1400    PT Time Calculation (min) 44 min    Equipment Utilized During Treatment Gait belt    Activity Tolerance Patient tolerated treatment well    Behavior During Therapy WFL for tasks assessed/performed           Past Medical History:  Diagnosis Date  . Allergy   . Anxiety   . Bone cancer ([place])    t_spine T-5  . Compression fracture   . Encounter for antineoplastic chemotherapy 01/31/2015  . Hearing loss   . Melanoma ([place])   . Multiple myeloma ([place])   . Prostate cancer ([place]) 2002  . S/P radiation therapy 08/21/14-09/04/14   T4-6 25Gy/32f  . S/P radiation therapy 11/29/14-12/12/14   rt prox humerus/shoulder/scapula 25Gy/128f . Skin cancer 1994   melanoma  right neck   . [place]  . Thyroid disease     Past Surgical History:  Procedure Laterality Date  . HERNIA REPAIR    . POSTERIOR CERVICAL FUSION/FORAMINOTOMY N/A 06/17/2014   Procedure: Thoracic five laminectomy for epidural tumor resection Thoracic 4-7 posterior lateral arthrodesis, segmental pedicle screw fixation.;  Surgeon: KyAshok PallMD;  [facility];  Service: Neurosurgery;  Laterality: N/A;    There were no vitals filed for this visit.    Subjective Assessment - 05/07/20 1320    Subjective Patient reports that he has had multiple falls in the past 6 months, at least 8 falls per patient/wife reports. Wife  reports that the R leg drags and was due to L basal ganglia CVA that occured in 2016. Patient reports that he sometimes catches R foot sometimes which causes him to trip. Patient/wife reports that he also has "blackouts" or near syncope events that has caused him to fall. Patient did have an ED visit on 8/26 due to fall with injury to head requiring staples. Patient is currently ambulating with RW indoors, and rollator outdoors.    Patient is accompained by: Family member   Wife (MKyra Manges  Pertinent History Anxiety, Multiple Myeloma with Thoracic Spinal METS, Stroke (L Basal Ganglia Infarct with R Hemiparesis, Thyroid Disease, Adhesive Capsulitis (L Shoulder), Dysphagia (d/t CVA    Limitations Walking;Lifting;Standing    Patient Stated Goals get well; get balance back    Currently in Pain? No/denies              [facility]20 0001      Assessment   Medical Diagnosis Gait Disorder/Falls    Referring Provider (PT) ZaAlger SimonsMD    Onset Date/Surgical Date 05/02/20   referral date   Hand Dominance Right    Next MD Visit 07/04/2020    Prior Therapy Known to clinic      Precautions   Precautions Fall      Balance Screen  Has the patient fallen in the past 6 months Yes    How many times? 8    Has the patient had a decrease in activity level because of a fear of falling?  Yes    Is the patient reluctant to leave their home because of a fear of falling?  No   reports it due COVID - 19     Home Environment   Living Environment Private residence    Living Arrangements Spouse/significant other    Available Help at Discharge Family    Type of Home House    Home Access Ramped entrance    Home Layout One level    Home Equipment Walker - standard;Shower seat;Grab bars - tub/shower;Grab bars - toilet;Transport chair;Walker - 2 wheels      Prior Function   Level of Independence Independent with household mobility with device    Vocation Retired    Leisure English as a second language teacher, Estate manager/land agent      Cognition   Overall Cognitive Status Within Functional Limits for tasks assessed      Sensation   Light Touch Appears Intact      Posture/Postural Control   Posture/Postural Control Postural limitations    Postural Limitations Rounded Shoulders;Forward head;Increased thoracic kyphosis    Posture Comments Patient demos signifcant limitations to posture with seated/standing position      ROM / Strength   AROM / PROM / Strength AROM;Strength      AROM   Overall AROM  Deficits    Overall AROM Comments BLE's AROM deficits due to weakness (RLE > LLE)       Strength   Overall Strength Deficits    Strength Assessment Site Hip;Knee;Ankle    Right/Left Hip Right;Left    Right Hip Flexion 4-/5    Right Hip ABduction 4-/5    Right Hip ADduction 4-/5    Left Hip Flexion 4/5    Left Hip ABduction 4/5    Left Hip ADduction 4/5    Right/Left Knee Right;Left    Right Knee Flexion 4+/5    Right Knee Extension 4/5    Left Knee Flexion 4+/5    Left Knee Extension 4/5    Right/Left Ankle Right;Left    Right Ankle Dorsiflexion 3/5    Left Ankle Dorsiflexion 4/5      Bed Mobility   Bed Mobility Rolling Right;Rolling Left;Supine to Sit;Sit to Supine   all bed mobility per patient/wife reports   Rolling Right Independent    Rolling Left Independent    Supine to Sit Independent    Sit to Supine Independent      Transfers   Transfers Sit to Stand;Stand to Sit    Sit to Stand 4: Min guard    Five time sit to stand comments  26.89 secs w/ UE support. increased hesistancy to let go of arm rests on chair requiring increased verbal/tactile cues for proper completion. Rollator positioned in front of patient for safety but did not utilize during testing    Stand to Sit 4: Min guard      Ambulation/Gait   Ambulation/Gait Yes    Ambulation/Gait Assistance 4: Min guard    Ambulation/Gait Assistance Details Completed ambulation with rollator, decreased toe clearance noted  on RLE (residual since prior CVA). After ambulation patient reports, 3/10 SOB at end of ambulation. Seated rest break required.     Ambulation Distance (Feet) 115 Feet    Assistive device Rollator    Gait Pattern Step-through pattern;Decreased arm swing -  right;Decreased arm swing - left;Decreased step length - right;Decreased step length - left;Decreased dorsiflexion - right;Trunk flexed;Poor foot clearance - right    Ambulation Surface Level;Indoor    Gait velocity 19.59 secs = 1.67 ft/sec      Standardized Balance Assessment   Standardized Balance Assessment Timed Up and Go Test;Berg Balance Test      Berg Balance Test   Sit to Stand Able to stand  independently using hands    Standing Unsupported Able to stand 2 minutes with supervision    Sitting with Back Unsupported but Feet Supported on Floor or Stool Able to sit safely and securely 2 minutes    Stand to Sit Controls descent by using hands    Transfers Able to transfer with verbal cueing and /or supervision    Standing Unsupported with Eyes Closed Able to stand 10 seconds with supervision    Standing Unsupported with Feet Together Able to place feet together independently and stand for 1 minute with supervision    From Standing, Reach Forward with Outstretched Arm Can reach forward >5 cm safely (2")    From Standing Position, Pick up Object from [place] to pick up shoe, needs supervision    From Standing Position, Turn to Look Behind Over each Shoulder Turn sideways only but maintains balance    Turn 360 Degrees Needs close supervision or verbal cueing    Standing Unsupported, Alternately Place Feet on Step/Stool Able to complete >2 steps/needs minimal assist    Standing Unsupported, One Foot in Front Needs help to step but can hold 15 seconds    Standing on One Leg Tries to lift leg/unable to hold 3 seconds but remains standing independently    Total Score 32    Berg comment: 32/56      Timed Up and Go Test   TUG Normal TUG      Normal TUG (seconds) 24.75   with rollator                     Objective measurements completed on examination: See above findings.               PT Education - 05/07/20 1638    Education Details Educated on POC, evaluation findings    Person(s) Educated Patient;Spouse    Methods Explanation    Comprehension Verbalized understanding            PT Short Term Goals - 05/07/20 1650      PT SHORT TERM GOAL #1   Title Patient will be independent with initial HEP for strengthening/balance/posture (All STGs Due: 06/04/2020)    Baseline no HEP established    Time 4    Period Weeks    Status New    Target Date 06/04/20      PT SHORT TERM GOAL #2   Title Patient will improve TUG to </= 20 seconds to demonstrate improved mobility and reduced fall risk    Baseline 24.75 secs    Time 4    Period Weeks    Status New      PT SHORT TERM GOAL #3   Title Patient wil improve Berg Balance to >/= 37/56 to demonstrate reduced risk for falls and improved balance.    Baseline 32/56    Time 4    Period Weeks    Status New      PT SHORT TERM GOAL #4   Title Patient will demo ability to ambulate >400 ft with LRAD  and supervision level for improved community mobility    Baseline 133ft with Rollator    Time 4    Period Weeks    Status New             PT Long Term Goals - 05/07/20 1653      PT LONG TERM GOAL #1   Title Patient will report independence with final HEP for strengthening/balance/posture (ALL LTGs due: 07/02/20)    Baseline no HEP established    Time 8    Period Weeks    Status New    Target Date 07/02/20      PT LONG TERM GOAL #2   Title Patient will demo ability to ambulate > 300 ft over outdoor paved surfaces with LRAD and superivison for improved community mobility    Baseline outdoor ambulation not assessed to date    Time 8    Period Weeks    Status New      PT LONG TERM GOAL #3   Title Pt will perform TUG test in </= 15 sconds to  indicate decreased risk of falling    Baseline 24.75 secs    Time 8    Period Weeks    Status New      PT LONG TERM GOAL #4   Title Patient will improve Berg Balance to >/= 45/56 to demonstrate improved functional standing balance and reduced fall risk.    Baseline 32/56    Time 8    Period Weeks    Status New      PT LONG TERM GOAL #5   Title Patient will improve gait speed to >/= 2.5 ft/sec to demonstrate improved community ambulation    Baseline 1.67 ft/sec    Time 8    Period Weeks    Status New                  Plan - 05/07/20 1645    Clinical Impression Statement Patient is a 86 y.o. male that was referred to Neuro OPPT for gait abnormalities, abnormal posture, and increased falls. Patient's PMH is significant for the following: Anxiety, Multiple Myeloma with Thoracic Spinal METS, Stroke (L Basal Ganglia Infarct with R Hemiparesis, Thyroid Disease, Adhesive Capsulitis (L Shoulder), Dysphagia (d/t CVA). Patient has had repeated falls approx 8 in the past 6 months, leading to head laceration and ED visits. Upon evaluation, patient presents with the following impairments: decreased strength, abnormal posture, abnormal gait, impaired balance, and increased risk for falls.Patient is at significant risk for falls with Merrilee Jansky Balance score of 32/56, and TUG time of 24.75 seconds.Patient will benefit from skilled PT services to address impairments listed above, reduce fall risk, and maximize functional mobility.    Personal Factors and Comorbidities Comorbidity 3+;Time since onset of injury/illness/exacerbation;Age    Comorbidities Anxiety, Multiple Myeloma with Thoracic Spinal METS, Stroke (L Basal Ganglia Infarct with R Hemiparesis, Thyroid Disease, Adhesive Capsulitis (L Shoulder), Dysphagia (d/t CVA)    Examination-Activity Limitations Transfers;Stairs;Stand;Locomotion Level;Sit;Bend    Examination-Participation Restrictions Community Activity;Yard Work    Copy Evolving/Moderate complexity    Clinical Decision Making Moderate    Rehab Potential Good    PT Frequency 2x / week    PT Duration 8 weeks    PT Treatment/Interventions ADLs/Self Care Home Management;Moist Heat;Cryotherapy;Therapeutic activities;DME Instruction;Gait training;Stair training;Functional mobility training;Therapeutic exercise;Balance training;Patient/family education;Orthotic Fit/Training;Neuromuscular re-education;Manual techniques;Passive range of motion;Vestibular    PT Next Visit Plan Initiate HEP focused on seated strength/balance and incorporate postural exercises. Try  Bracing for RLE to promote improved gait pattern. Gait training with rollator vs. RW.    Recommended Other Services Speech Therapy    Consulted and Agree with Plan of Care Patient;Family member/caregiver    Family Member Consulted Wife Kyra Manges)           Patient will benefit from skilled therapeutic intervention in order to improve the following deficits and impairments:  Abnormal gait, Decreased endurance, Decreased balance, Decreased mobility, Difficulty walking, Postural dysfunction, Pain, Decreased strength, Decreased knowledge of use of DME, Decreased activity tolerance, Decreased range of motion  Visit Diagnosis: Unsteadiness on feet  Muscle weakness (generalized)  Abnormal posture  Other abnormalities of gait and mobility  Repeated falls     Problem List Patient Active Problem List   Diagnosis Date Noted  . Multiple myeloma not having achieved remission ([place]) 10/24/2015  . Dysphagia as late effect of stroke 07/10/2015  . Diarrhea 04/25/2015  . Cancer associated pain 04/25/2015  . Long term current use of anticoagulant therapy 04/25/2015  . Basal ganglia infarction ([place]) 02/12/2015  . Dizziness   . Expressive aphasia   . Past pointing   . Right leg weakness   . History of stroke 02/11/2015  . CVA (cerebral infarction) 02/11/2015  . Aspiration pneumonia ([place])  02/11/2015  . Aphasia   . Difficulty speaking   . Speech difficult to understand   . Encounter for antineoplastic chemotherapy 01/31/2015  . Constipation 12/20/2014  . Rash 12/20/2014  . Peripheral edema 12/06/2014  . Multifactorial gait disorder 09/05/2014  . Multiple myeloma ([place])   . Paraplegia ([place]) 06/21/2014  . Postoperative anemia due to acute blood loss 06/21/2014  . 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 Spine metastasis ([place]) 06/17/2014  . Thoracic spine tumor 06/17/2014  . Metastatic bone cancer ([place]) 06/15/2014    Jones Bales, PT, DPT 05/07/2020, 4:57 PM  [facility] [address] [place], [place], [address] Phone: [phone]   Fax:  [phone]  Name: Jeremiah Owens MRN: [id number] Date of Birth: August 25, 1929

## 2020-05-08 ENCOUNTER — Other Ambulatory Visit (HOSPITAL_COMMUNITY): Payer: Self-pay

## 2020-05-08 DIAGNOSIS — R059 Cough, unspecified: Secondary | ICD-10-CM

## 2020-05-08 DIAGNOSIS — R1310 Dysphagia, unspecified: Secondary | ICD-10-CM

## 2020-05-09 ENCOUNTER — Ambulatory Visit: Payer: Medicare Other

## 2020-05-09 ENCOUNTER — Other Ambulatory Visit: Payer: Self-pay

## 2020-05-09 VITALS — BP 102/60 | HR 86

## 2020-05-09 DIAGNOSIS — M6281 Muscle weakness (generalized): Secondary | ICD-10-CM

## 2020-05-09 DIAGNOSIS — R296 Repeated falls: Secondary | ICD-10-CM

## 2020-05-09 DIAGNOSIS — R2689 Other abnormalities of gait and mobility: Secondary | ICD-10-CM

## 2020-05-09 DIAGNOSIS — R293 Abnormal posture: Secondary | ICD-10-CM | POA: Diagnosis not present

## 2020-05-09 DIAGNOSIS — R2681 Unsteadiness on feet: Secondary | ICD-10-CM | POA: Diagnosis not present

## 2020-05-09 DIAGNOSIS — R1312 Dysphagia, oropharyngeal phase: Secondary | ICD-10-CM | POA: Diagnosis not present

## 2020-05-09 NOTE — Therapy (Signed)
[facility] [address] [place], [place], [address] Phone: [phone]   Fax:  [phone]  Physical Therapy Treatment  Patient Details  Name: Jeremiah Owens MRN: [id number] Date of Birth: 1929-11-13 Referring Provider (PT): [name], MD   Encounter Date: 05/09/2020   PT End of Session - 05/09/20 0935    Visit Number 2    Number of Visits 17    Date for PT Re-Evaluation 08/05/20   POC for 8 weeks, Cert for 90 days   Authorization Type Medicare    Progress Note Due on Visit 10    PT Start Time 0845    PT Stop Time 0930    PT Time Calculation (min) 45 min    Equipment Utilized During Treatment Gait belt    Activity Tolerance Patient tolerated treatment well    Behavior During Therapy [facility] for tasks assessed/performed           Past Medical History:  Diagnosis Date  . Allergy   . Anxiety   . Bone cancer ([place])    t_spine T-5  . Compression fracture   . Encounter for antineoplastic chemotherapy 01/31/2015  . Hearing loss   . Melanoma ([place])   . Multiple myeloma ([place])   . Prostate cancer ([place]) 2002  . S/P radiation therapy 08/21/14-09/04/14   T4-6 25Gy/70f  . S/P radiation therapy 11/29/14-12/12/14   rt prox humerus/shoulder/scapula 25Gy/186f . Skin cancer 1994   melanoma  right neck   . [place]  . Thyroid disease     Past Surgical History:  Procedure Laterality Date  . HERNIA REPAIR    . POSTERIOR CERVICAL FUSION/FORAMINOTOMY N/A 06/17/2014   Procedure: Thoracic five laminectomy for epidural tumor resection Thoracic 4-7 posterior lateral arthrodesis, segmental pedicle screw fixation.;  Surgeon: KyAshok PallMD;  [facility];  Service: Neurosurgery;  Laterality: N/A;    Vitals:   05/09/20 0852 05/09/20 0853  BP: 122/62 102/60  Pulse: 86      Subjective Assessment - 05/09/20 0849    Subjective Patient denies pain and headaches. Pt reports no new falls. Pt reports feeling "not as  clear" when he first gets up. Pt reports no warnings with "blackouts."    Patient is accompained by: Family member   Wife (MKyra Manges  Pertinent History Anxiety, Multiple Myeloma with Thoracic Spinal METS, Stroke (L Basal Ganglia Infarct with R Hemiparesis, Thyroid Disease, Adhesive Capsulitis (L Shoulder), Dysphagia (d/t CVA    Limitations Walking;Lifting;Standing    Patient Stated Goals get well; get balance back    Currently in Pain? No/denies                             [facility] 05[facility]20 0857      Transfers   Transfers Sit to Stand;Stand to Sit    Sit to Stand 4: Min guard    Sit to Stand Details Verbal cues for technique    Sit to Stand Details (indicate cue type and reason) Pt was cued to lean forwar    Five time sit to stand comments  Pt performed sit to stand x 5 with verbal cues to lean foward to help with rise. Pt initially leaning posterior and unsteady upon rising CGA/min assist.    Stand to Sit 4: Min guard    Stand to Sit Details Pt was cued to lean forward and control descent  Comments Pt was instructed in rollator sit to stand transfers including proper locking of brakes prior to sitting and not pushing back. Also recommended pushing up against wall for added safety. Pt performed and needed min assist when rising for safety as pushed back some.      Ambulation/Gait   Ambulation/Gait Yes    Ambulation/Gait Assistance 4: Min guard    Ambulation/Gait Assistance Details Pt ambulated 230' with rollator initially presenting with decreased heel strike. R toe drags during terminal stance-swing phases. Pt with decreased step length on RLE. Pt ambulated an additional 28' using FWW with improved posture and slightly better step-through with RLE with this AD. Pt was given verbal cues to increase foot clearance and try to get more heel strike throughout.    Ambulation Distance (Feet) 230 Feet   115   Assistive device Rollator;Rolling walker     Gait Pattern Right flexed knee in stance;Left flexed knee in stance;Trunk flexed;Step-through pattern;Poor foot clearance - right    Ambulation Surface Level;Indoor      Exercises   Exercises Knee/Hip;Other Exercises    Other Exercises  Pt performed seated rows with scapular squeezes x 10 reps with verbal cues for form. Pt then instructed in repeated STS training.       Knee/Hip Exercises: Stretches   Active Hamstring Stretch 30 seconds;3 reps;Both    Active Hamstring Stretch Limitations Pt reports feeling stretch on RLE without even leaning forward.      Knee/Hip Exercises: Seated   Marching 10 reps    Marching Limitations Pt reported SOB after 10 reps with notable heavy breathing.    Sit to [employer] 10 reps                  PT Education - 05/09/20 0930    Education Details Pt educated on safe use of rollator when feeling faint and needing to sit. Pt verbalized and demonstrated understading on proper locking and safety with transferring stand<>sit with rollator. PT also educated pt on pushing rollator up against stable surface before sitting if possible to optimize safety. PT also educated on HEP performance including exercise mechanics, proper pacing and hold time for stretches.    Person(s) Educated Patient    Methods Explanation;Demonstration;Handout    Comprehension Verbalized understanding            PT Short Term Goals - 05/07/20 1650      PT SHORT TERM GOAL #1   Title Patient will be independent with initial HEP for strengthening/balance/posture (All STGs Due: 06/04/2020)    Baseline no HEP established    Time 4    Period Weeks    Status New    Target Date 06/04/20      PT SHORT TERM GOAL #2   Title Patient will improve TUG to </= 20 seconds to demonstrate improved mobility and reduced fall risk    Baseline 24.75 secs    Time 4    Period Weeks    Status New      PT SHORT TERM GOAL #3   Title Patient wil improve Berg Balance to >/= 37/56 to demonstrate  reduced risk for falls and improved balance.    Baseline 32/56    Time 4    Period Weeks    Status New      PT SHORT TERM GOAL #4   Title Patient will demo ability to ambulate >400 ft with LRAD and supervision level for improved community mobility    Baseline 120f with Rollator  Time 4    Period Weeks    Status New             PT Long Term Goals - 05/07/20 1653      PT LONG TERM GOAL #1   Title Patient will report independence with final HEP for strengthening/balance/posture (ALL LTGs due: 07/02/20)    Baseline no HEP established    Time 8    Period Weeks    Status New    Target Date 07/02/20      PT LONG TERM GOAL #2   Title Patient will demo ability to ambulate > 300 ft over outdoor paved surfaces with LRAD and superivison for improved community mobility    Baseline outdoor ambulation not assessed to date    Time 8    Period Weeks    Status New      PT LONG TERM GOAL #3   Title Pt will perform TUG test in </= 15 sconds to indicate decreased risk of falling    Baseline 24.75 secs    Time 8    Period Weeks    Status New      PT LONG TERM GOAL #4   Title Patient will improve Berg Balance to >/= 45/56 to demonstrate improved functional standing balance and reduced fall risk.    Baseline 32/56    Time 8    Period Weeks    Status New      PT LONG TERM GOAL #5   Title Patient will improve gait speed to >/= 2.5 ft/sec to demonstrate improved community ambulation    Baseline 1.67 ft/sec    Time 8    Period Weeks    Status New                 Plan - 05/09/20 5537    Clinical Impression Statement Pt presented with overall flexed posture including head/trunk and BLE. Pt with decreased mucle length in bilateral hamstrings contributing to flexion. Pt with decreased step length bilaterally and decreased foot clearance on RLE. PT to incorporate hamstring stretch bil and rows + scapular squeeze to HEP. PT to continue gait training, balance training and  strengthening to work toward short and long term goals.    Personal Factors and Comorbidities Comorbidity 3+;Time since onset of injury/illness/exacerbation;Age    Comorbidities Anxiety, Multiple Myeloma with Thoracic Spinal METS, Stroke (L Basal Ganglia Infarct with R Hemiparesis, Thyroid Disease, Adhesive Capsulitis (L Shoulder), Dysphagia (d/t CVA)    Examination-Activity Limitations Transfers;Stairs;Stand;Locomotion Level;Sit;Bend    Examination-Participation Restrictions Community Activity;Yard Work    [employer]chant [employer] officer Evolving/Moderate complexity    Rehab Potential Good    PT Frequency 2x / week    PT Duration 8 weeks    PT Treatment/Interventions ADLs/Self Care Home Management;Moist Heat;Cryotherapy;Therapeutic activities;DME Instruction;Gait training;Stair training;Functional mobility training;Therapeutic exercise;Balance training;Patient/family education;Orthotic Fit/Training;Neuromuscular re-education;Manual techniques;Passive range of motion;Vestibular    PT Next Visit Plan Continue to progress HEP with seated/standing strengthening and balance when safe. Gait training with rollator vs. RW.    PT Home Exercise Plan PT to incorporate hamstring stretch bil and rows + scapular squeeze to HEP.    Consulted and Agree with Plan of Care Patient           Patient will benefit from skilled therapeutic intervention in order to improve the following deficits and impairments:  Abnormal gait, Decreased endurance, Decreased balance, Decreased mobility, Difficulty walking, Postural dysfunction, Pain, Decreased strength, Decreased knowledge of use of DME, Decreased activity tolerance, Decreased range  of motion  Visit Diagnosis: Unsteadiness on feet  Muscle weakness (generalized)  Abnormal posture  Other abnormalities of gait and mobility  Repeated falls     Problem List Patient Active Problem List   Diagnosis Date Noted  . Multiple myeloma not having achieved  remission ([place]) 10/24/2015  . Dysphagia as late effect of stroke 07/10/2015  . Diarrhea 04/25/2015  . Cancer associated pain 04/25/2015  . Long term current use of anticoagulant therapy 04/25/2015  . Basal ganglia infarction ([place]) 02/12/2015  . Dizziness   . Expressive aphasia   . Past pointing   . Right leg weakness   . History of stroke 02/11/2015  . CVA (cerebral infarction) 02/11/2015  . Aspiration pneumonia ([place]) 02/11/2015  . Aphasia   . Difficulty speaking   . Speech difficult to understand   . Encounter for antineoplastic chemotherapy 01/31/2015  . Constipation 12/20/2014  . Rash 12/20/2014  . Peripheral edema 12/06/2014  . Multifactorial gait disorder 09/05/2014  . Multiple myeloma ([place])   . Paraplegia ([place]) 06/21/2014  . Postoperative anemia due to acute blood loss 06/21/2014  . 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 Spine metastasis ([place]) 06/17/2014  . Thoracic spine tumor 06/17/2014  . Metastatic bone cancer ([place]) 06/15/2014    Rosalita Levan, SPT 05/09/2020, 11:58 AM  [facility] [address] [place], [place], [address] Phone: [phone]   Fax:  [phone]  Name: Jeremiah Owens MRN: [id number] Date of Birth: 1930/07/22

## 2020-05-09 NOTE — Patient Instructions (Signed)
Access Code: [email or web address] URL: [email or web address] Date: 05/09/2020 Prepared by: Cherly Anderson  Exercises Sit to Stand with Armchair - 1 x daily - 7 x weekly - 1 sets - 5 reps Seated March - 2 x daily - 5 x weekly - 2 sets - 10 reps Seated Scapular Retraction - 2 x daily - 5 x weekly - 2 sets - 10 reps Seated Hamstring Stretch - 3 x daily - 7 x weekly - 1 sets - 3 reps - 30 sec hold

## 2020-05-14 ENCOUNTER — Ambulatory Visit (HOSPITAL_COMMUNITY)
Admission: RE | Admit: 2020-05-14 | Discharge: 2020-05-14 | Disposition: A | Discharge status: Home / Self Care | Payer: Medicare Other | Source: Ambulatory Visit | Attending: Physical Medicine & Rehabilitation | Admitting: Physical Medicine & Rehabilitation

## 2020-05-14 ENCOUNTER — Other Ambulatory Visit: Payer: Self-pay

## 2020-05-14 DIAGNOSIS — K219 Gastro-esophageal reflux disease without esophagitis: Secondary | ICD-10-CM | POA: Diagnosis not present

## 2020-05-14 DIAGNOSIS — R1312 Dysphagia, oropharyngeal phase: Secondary | ICD-10-CM | POA: Diagnosis not present

## 2020-05-14 DIAGNOSIS — R059 Cough, unspecified: Secondary | ICD-10-CM | POA: Diagnosis not present

## 2020-05-14 DIAGNOSIS — R1310 Dysphagia, unspecified: Secondary | ICD-10-CM | POA: Diagnosis not present

## 2020-05-14 NOTE — Progress Notes (Signed)
Modified Barium Swallow Progress Note  Patient Details  Name: Jeremiah Owens MRN: [id number] Date of Birth: Sep 20, 1929  Today's Date: 05/14/2020  Modified Barium Swallow completed.  Full report located under Chart Review in the Imaging Section.  Brief recommendations include the following:  Clinical Impression  Pt has history of chronic dysphagia and wife reports increased coughing noticeably with thin liquids. Pt and wife deny recent pna. Sitting in dysphagia chair he is significantly kyphotic and head/shoulders are forward moving toward horizontal position. Timing and coordination of swallow was adequate with pharyngeal impairments marked by reduced tongue base retraction, reduced pharyngeal contraction and episode of incomplete laryngeal closure leading to silent aspiration with thin (x 1). Pharyngeal pressure is reduced with incomplete pharyngeal clearace. Portion of liquid bolus is not prolled toward UES and backflows into posterior oral cavity. Independent subswallows clear barium. Remote CVA affected right side and right head turn attempted but ineffective strategy. Esophageal scan was unremarkable. His vocal quality significantly wet at end of study. Pt and wife educated to cough freqently during meals, use caution with dual consistency foods, multiple swallows and pills whole in puree. Wife is familiar with thick liquids and inquired about it's use. Educated wife/pt re: chronic increased aspiration risk especially in times of illnesses and that wife could use thickener to nectar consistency during those times. Also advised water is safer liquid to consume. Continue regular texture, thin liquids and cup sips preferred.            Swallow Evaluation Recommendations       SLP Diet Recommendations: Regular solids;Thin liquid   Liquid Administration via: Cup;No straw   Medication Administration: Whole meds with puree   Supervision: Patient able to self feed;Intermittent supervision to cue  for compensatory strategies   Compensations: Slow rate;Small sips/bites;Clear throat after each swallow;Clear throat intermittently;Multiple dry swallows after each bite/sip   Postural Changes: Seated upright at 90 degrees   Oral Care Recommendations: Oral care BID        [name] 05/14/2020,1:38 PM   [name].Ed Risk analyst [phone] Office [phone]

## 2020-05-15 ENCOUNTER — Ambulatory Visit: Payer: Medicare Other

## 2020-05-15 DIAGNOSIS — R2689 Other abnormalities of gait and mobility: Secondary | ICD-10-CM | POA: Diagnosis not present

## 2020-05-15 DIAGNOSIS — M6281 Muscle weakness (generalized): Secondary | ICD-10-CM | POA: Diagnosis not present

## 2020-05-15 DIAGNOSIS — R296 Repeated falls: Secondary | ICD-10-CM | POA: Diagnosis not present

## 2020-05-15 DIAGNOSIS — R293 Abnormal posture: Secondary | ICD-10-CM

## 2020-05-15 DIAGNOSIS — R1312 Dysphagia, oropharyngeal phase: Secondary | ICD-10-CM | POA: Diagnosis not present

## 2020-05-15 DIAGNOSIS — R2681 Unsteadiness on feet: Secondary | ICD-10-CM | POA: Diagnosis not present

## 2020-05-15 NOTE — Therapy (Signed)
[facility] [address] [place], [place], [address] Phone: [phone]   Fax:  [phone]  Physical Therapy Treatment  Patient Details  Name: Jeremiah Owens MRN: [id number] Date of Birth: September 26, 1929 Referring Provider (PT): [name], MD   Encounter Date: 05/15/2020   PT End of Session - 05/15/20 0946    Visit Number 3    Number of Visits 17    Date for PT Re-Evaluation 08/05/20   POC 8 weeks, cert 90 days   Authorization Type Medicare    Progress Note Due on Visit 10    PT Start Time 0846    PT Stop Time 0930    PT Time Calculation (min) 44 min    Equipment Utilized During Treatment Gait belt;Other (comment)   Attempted R AFO   Activity Tolerance Patient tolerated treatment well    Behavior During Therapy WFL for tasks assessed/performed           Past Medical History:  Diagnosis Date  . Allergy   . Anxiety   . Bone cancer ([name])    t_spine T-5  . Compression fracture   . Encounter for antineoplastic chemotherapy 01/31/2015  . Hearing loss   . Melanoma ([name])   . Multiple myeloma ([name])   . Prostate cancer ([name]) 2002  . S/P radiation therapy 08/21/14-09/04/14   T4-6 25Gy/22f  . S/P radiation therapy 11/29/14-12/12/14   rt prox humerus/shoulder/scapula 25Gy/114f . Skin cancer 1994   melanoma  right neck   . [name]  . Thyroid disease     Past Surgical History:  Procedure Laterality Date  . HERNIA REPAIR    . POSTERIOR CERVICAL FUSION/FORAMINOTOMY N/A 06/17/2014   Procedure: Thoracic five laminectomy for epidural tumor resection Thoracic 4-7 posterior lateral arthrodesis, segmental pedicle screw fixation.;  Surgeon: KyAshok PallMD;  [facility];  Service: Neurosurgery;  Laterality: N/A;    There were no vitals filed for this visit.   Subjective Assessment - 05/15/20 0851    Subjective Pt reports no new falls and achy pain in L shoulder when he wakes up in the mornings,  headaches have improved. Pt reports no issues with HEP. No recent blackouts.    Patient is accompained by: --    Pertinent History Anxiety, Multiple Myeloma with Thoracic Spinal METS, Stroke (L Basal Ganglia Infarct with R Hemiparesis, Thyroid Disease, Adhesive Capsulitis (L Shoulder), Dysphagia (d/t CVA    Limitations Walking;Lifting;Standing    Patient Stated Goals get well; get balance back    Currently in Pain? Yes    Pain Score 4     Pain Location Shoulder    Pain Orientation Left    Pain Descriptors / Indicators Aching    Multiple Pain Sites Yes    Pain Score 5    Pain Location Hip    Pain Orientation Left    Pain Descriptors / Indicators Aching                             [facility] Adult PT Treatment/Exercise - 05/15/20 0911      Transfers   Transfers Sit to Stand;Stand to Sit    Sit to Stand 4: Min guard    Sit to Stand Details Verbal cues for technique;Verbal cues for precautions/safety    Sit to Stand Details (indicate cue type and reason) Pt was cued to lean forward      Ambulation/Gait   Ambulation/Gait Yes  Ambulation/Gait Assistance 4: Min guard    Ambulation/Gait Assistance Details Tried R AFO with posterior strut    Ambulation Distance (Feet) 230 Feet   115' with RW and 115' with AFO and RW   Assistive device Rolling walker    Gait Pattern Step-through pattern;Decreased step length - right;Decreased hip/knee flexion - right    Ambulation Surface Level;Indoor    Gait Comments Pt was instructed in gait training using RW and CGA with cues for proper posture and cues for taking a larger step with RLE.  PT attempted R AFO with gait training to improve R foot clearance and it did not have any signficant benefit. AFO discontinued.      Posture/Postural Control   Posture/Postural Control Postural limitations    Postural Limitations Rounded Shoulders;Increased thoracic kyphosis    Posture Comments Pt was cued on proper posture during gait, transfers, and  exercises.      Therapeutic Activites    Therapeutic Activities Other Therapeutic Activities    Other Therapeutic Activities Pt was instructed in repeated STS training from slightly elevated surface x8 reps using arms to assist with lift, CGA and no AD. Pt was given verbal cues on adequate anterior weight shift as well as tactile and verbal cues on proper posture when coming to full stand. PT also reviewed safety with rollator for fall prevention and proper use of rest breaks when ambulating in community. Pt required min cueing for proper sequencing and safety with locking brakes.      Neuro Re-ed    Neuro Re-ed Details  --      Exercises   Exercises Other Exercises    Other Exercises  Pt was instructed in R hip strengthening exercise in supine in which he has to perform SLR and bring RLE over pebble distally and then back to midline. Pt was cued to not allow foot to touch pebble when bringing RLE over. Pt also cued on slow and controlled motion. Pt was also instructed in standing alternating marches x 12 reps with cues to bring knees to chest using RW for UE support. Pt was instructed in alternating step ups on 4" step leading with RLE in order to inrease R step length with RW for UE support. x10 reps with CGA.                  PT Education - 05/15/20 [phone]    Education Details PT reviewed safe use of rollator when feeling faint and needing to sit to assess carryover and to prevent falls. Pt verbalized and demonstrated understading on proper locking and safety with transferring stand<>sit with rollator. PT also educated pt on pushing rollator up against stable surface before sitting if possible to optimize safety. PT also reviewed HEP.    Person(s) Educated Patient    Methods Explanation;Demonstration;Handout    Comprehension Verbalized understanding;Returned demonstration            PT Short Term Goals - 05/07/20 1650      PT SHORT TERM GOAL #1   Title Patient will be independent  with initial HEP for strengthening/balance/posture (All STGs Due: 06/04/2020)    Baseline no HEP established    Time 4    Period Weeks    Status New    Target Date 06/04/20      PT SHORT TERM GOAL #2   Title Patient will improve TUG to </= 20 seconds to demonstrate improved mobility and reduced fall risk    Baseline 24.75 secs  Time 4    Period Weeks    Status New      PT SHORT TERM GOAL #3   Title Patient wil improve Berg Balance to >/= 37/56 to demonstrate reduced risk for falls and improved balance.    Baseline 32/56    Time 4    Period Weeks    Status New      PT SHORT TERM GOAL #4   Title Patient will demo ability to ambulate >400 ft with LRAD and supervision level for improved community mobility    Baseline 138f with Rollator    Time 4    Period Weeks    Status New             PT Long Term Goals - 05/07/20 1653      PT LONG TERM GOAL #1   Title Patient will report independence with final HEP for strengthening/balance/posture (ALL LTGs due: 07/02/20)    Baseline no HEP established    Time 8    Period Weeks    Status New    Target Date 07/02/20      PT LONG TERM GOAL #2   Title Patient will demo ability to ambulate > 300 ft over outdoor paved surfaces with LRAD and superivison for improved community mobility    Baseline outdoor ambulation not assessed to date    Time 8    Period Weeks    Status New      PT LONG TERM GOAL #3   Title Pt will perform TUG test in </= 15 sconds to indicate decreased risk of falling    Baseline 24.75 secs    Time 8    Period Weeks    Status New      PT LONG TERM GOAL #4   Title Patient will improve Berg Balance to >/= 45/56 to demonstrate improved functional standing balance and reduced fall risk.    Baseline 32/56    Time 8    Period Weeks    Status New      PT LONG TERM GOAL #5   Title Patient will improve gait speed to >/= 2.5 ft/sec to demonstrate improved community ambulation    Baseline 1.67 ft/sec    Time 8      Period Weeks    Status New                 Plan - 05/15/20 0951    Clinical Impression Statement Today's session focused on gait training, functional transfer training, LE strengthening, R hip strengthening and exercises aimed at increased R step length during gait in order to prevent falls. PT attempted applying R AFO during gait training to assist in R foot clearance however AFO did not provide any significant benefit. Pt responded well to supine hip strengthening exercise and began to clear "pebble" more and more as reps increased. Pt exhibited improved ROM and motor control throughout exercise. PT to incorporate more low-level balance training next visit.    Personal Factors and Comorbidities Comorbidity 3+;Time since onset of injury/illness/exacerbation;Age    Comorbidities Anxiety, Multiple Myeloma with Thoracic Spinal METS, Stroke (L Basal Ganglia Infarct with R Hemiparesis, Thyroid Disease, Adhesive Capsulitis (L Shoulder), Dysphagia (d/t CVA)    Examination-Activity Limitations Transfers;Stairs;Stand;Locomotion Level;Sit;Bend    Examination-Participation Restrictions Community Activity;Yard Work    Stability/Clinical Decision Making Evolving/Moderate complexity    Rehab Potential Good    PT Frequency 2x / week    PT Duration 8 weeks    PT Treatment/Interventions  ADLs/Self Care Home Management;Moist Heat;Cryotherapy;Therapeutic activities;DME Instruction;Gait training;Stair training;Functional mobility training;Therapeutic exercise;Balance training;Patient/family education;Orthotic Fit/Training;Neuromuscular re-education;Manual techniques;Passive range of motion;Vestibular    PT Next Visit Plan Continue to progress HEP with seated/standing strengthening and balance when safe. Incorporate more low-level balance exercises and balance training in parallel bars.    PT Home Exercise Plan PT to incorporate supine hip strengthening exercises to HEP.    Consulted and Agree with Plan of  Care Patient           Patient will benefit from skilled therapeutic intervention in order to improve the following deficits and impairments:  Abnormal gait, Decreased endurance, Decreased balance, Decreased mobility, Difficulty walking, Postural dysfunction, Pain, Decreased strength, Decreased knowledge of use of DME, Decreased activity tolerance, Decreased range of motion  Visit Diagnosis: Unsteadiness on feet  Muscle weakness (generalized)  Abnormal posture     Problem List Patient Active Problem List   Diagnosis Date Noted  . Multiple myeloma not having achieved remission ([place]) 10/24/2015  . Dysphagia as late effect of stroke 07/10/2015  . Diarrhea 04/25/2015  . Cancer associated pain 04/25/2015  . Long term current use of anticoagulant therapy 04/25/2015  . Basal ganglia infarction ([place]) 02/12/2015  . Dizziness   . Expressive aphasia   . Past pointing   . Right leg weakness   . History of stroke 02/11/2015  . CVA (cerebral infarction) 02/11/2015  . Aspiration pneumonia ([place]) 02/11/2015  . Aphasia   . Difficulty speaking   . Speech difficult to understand   . Encounter for antineoplastic chemotherapy 01/31/2015  . Constipation 12/20/2014  . Rash 12/20/2014  . Peripheral edema 12/06/2014  . Multifactorial gait disorder 09/05/2014  . Multiple myeloma ([place])   . Paraplegia ([place]) 06/21/2014  . Postoperative anemia due to acute blood loss 06/21/2014  . 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 Spine metastasis ([place]) 06/17/2014  . Thoracic spine tumor 06/17/2014  . Metastatic bone cancer ([place]) 06/15/2014    Rosalita Levan, SPT 05/15/2020, 10:54 AM  [facility] [address] [place], [place], [address] Phone: [phone]   Fax:  [phone]  Name: Jaiyon Wander MRN: [id number] Date of Birth: April 19, 1930

## 2020-05-18 ENCOUNTER — Ambulatory Visit: Payer: Medicare Other

## 2020-05-18 ENCOUNTER — Other Ambulatory Visit: Payer: Self-pay

## 2020-05-18 DIAGNOSIS — R293 Abnormal posture: Secondary | ICD-10-CM | POA: Diagnosis not present

## 2020-05-18 DIAGNOSIS — R2689 Other abnormalities of gait and mobility: Secondary | ICD-10-CM | POA: Diagnosis not present

## 2020-05-18 DIAGNOSIS — R296 Repeated falls: Secondary | ICD-10-CM | POA: Diagnosis not present

## 2020-05-18 DIAGNOSIS — R1312 Dysphagia, oropharyngeal phase: Secondary | ICD-10-CM | POA: Diagnosis not present

## 2020-05-18 DIAGNOSIS — M6281 Muscle weakness (generalized): Secondary | ICD-10-CM

## 2020-05-18 DIAGNOSIS — R2681 Unsteadiness on feet: Secondary | ICD-10-CM | POA: Diagnosis not present

## 2020-05-18 NOTE — Therapy (Signed)
[facility] [address] [place], [place], [address] Phone: [phone]   Fax:  [phone]  Physical Therapy Treatment  Patient Details  Name: Jeremiah Owens MRN: [id number] Date of Birth: [birth year] Referring Provider (PT): [name], MD   Encounter Date: 05/18/2020   PT End of Session - 05/18/20 1021    Visit Number 4    Number of Visits 17    Date for PT Re-Evaluation 08/05/20   POC 8 weeks, cert 90 days   Authorization Type Medicare    Progress Note Due on Visit 10    PT Start Time 1018    PT Stop Time 1058    PT Time Calculation (min) 40 min    Equipment Utilized During Treatment Gait belt    Activity Tolerance Patient tolerated treatment well    Behavior During Therapy WFL for tasks assessed/performed           Past Medical History:  Diagnosis Date  . Allergy   . Anxiety   . Bone cancer ([place])    t_spine T-5  . Compression fracture   . Encounter for antineoplastic chemotherapy 01/31/2015  . Hearing loss   . Melanoma ([place])   . Multiple myeloma ([place])   . Prostate cancer ([place]) 2002  . S/P radiation therapy 08/21/14-09/04/14   T4-6 25Gy/76f  . S/P radiation therapy 11/29/14-12/12/14   rt prox humerus/shoulder/scapula 25Gy/122f . Skin cancer 1994   melanoma  right neck   . Stroke ([facility]  . Thyroid disease     Past Surgical History:  Procedure Laterality Date  . HERNIA REPAIR    . POSTERIOR CERVICAL FUSION/FORAMINOTOMY N/A 06/17/2014   Procedure: Thoracic five laminectomy for epidural tumor resection Thoracic 4-7 posterior lateral arthrodesis, segmental pedicle screw fixation.;  Surgeon: KyAshok PallMD;  [facility];  Service: Neurosurgery;  Laterality: N/A;    There were no vitals filed for this visit.   Subjective Assessment - 05/18/20 1021    Subjective Pt reports that yesterday he had a fall at [place] Was [place] up and lost balance  with feet getting tangled under him and he fell backwards. Was unable to get wife's attention so was on floor about 20 min before wife found him. Wife was able to help him up when she did find him. Denying any increased pain at this time.    Pertinent History Anxiety, Multiple Myeloma with Thoracic Spinal METS, Stroke (L Basal Ganglia Infarct with R Hemiparesis, Thyroid Disease, Adhesive Capsulitis (L Shoulder), Dysphagia (d/t CVA    Limitations Walking;Lifting;Standing    Patient Stated Goals get well; get balance back    Currently in Pain? No/denies                             [facility] 1028      Transfers   Transfers Sit to Stand;Stand to Sit    Sit to Stand 5: Supervision;4: Min guard    Sit to Stand Details Verbal cues for technique    Sit to Stand Details (indicate cue type and reason) Pt was cued to lean foward to help with rising and always push from surface not pull on walker.    Stand to Sit 5: Supervision;4: Min guard    Stand to Sit Details (indicate cue type and reason) Verbal cues for technique    Stand  to Sit Details Pt was instructed to bring rollator all the way in next to seat and then turn versus backing up. Practiced this with last bout of gait as on prior attempts had turned too ealy having to back up about 5'. PT explained risk of this with losing balance backwards.      Ambulation/Gait   Ambulation/Gait Yes    Ambulation/Gait Assistance 4: Min guard    Ambulation/Gait Assistance Details Pt was given verbal cues to increase step length and foot clearance and try to look up. Pt with kyphotic, flexed posture.    Ambulation Distance (Feet) 230 Feet    Assistive device Rolling walker    Gait Pattern Step-through pattern;Decreased step length - right;Decreased step length - left;Decreased dorsiflexion - right;Decreased dorsiflexion - left;Right flexed knee in stance;Left flexed knee in stance;Trunk flexed    Ambulation  Surface Level;Indoor      Self-Care   Self-Care Other Self-Care Comments    Other Self-Care Comments  PT assessed pt after fall. No reports of pain. Denies any bruising on back or hips as wife checked after. He did not have any pain with palpation along spine in session and no new bruising noted on forearms or lower legs. Pt did have bruise on left hand but reports has been there awhile. PT discussed fall prevention strategies including planning ahead and having something on him like a phone, whistle or bell to be able to get wife's attention if he should fall as can not get up by himself.  Also instructed to change positions slowly especially when first getting up and to sit if feeling at all light headed.      Neuro Re-ed    Neuro Re-ed Details  Standing at walker: stepping over and back 2x4" black foam beam x 10 each foot with more difficulty with RLE. Standing without UE support x 30 sec then with reaching for targets with RUE x 30 sec. Sit to stand 5 x 2 edge of mat working on correct form with leaning forward to help with rise and then also leaning forward to help with descent. Pt cued not to sit down hard to protect back.                  PT Education - 05/18/20 1113    Education Details Pt was instructed in fall prevention measures as noted in self care section. Also educated on transfer training.    Person(s) Educated Patient    Methods Explanation    Comprehension Verbalized understanding            PT Short Term Goals - 05/07/20 1650      PT SHORT TERM GOAL #1   Title Patient will be independent with initial HEP for strengthening/balance/posture (All STGs Due: 06/04/2020)    Baseline no HEP established    Time 4    Period Weeks    Status New    Target Date 06/04/20      PT SHORT TERM GOAL #2   Title Patient will improve TUG to </= 20 seconds to demonstrate improved mobility and reduced fall risk    Baseline 24.75 secs    Time 4    Period Weeks    Status New        PT SHORT TERM GOAL #3   Title Patient wil improve Berg Balance to >/= 37/56 to demonstrate reduced risk for falls and improved balance.    Baseline 32/56    Time 4  Period Weeks    Status New      PT SHORT TERM GOAL #4   Title Patient will demo ability to ambulate >400 ft with LRAD and supervision level for improved community mobility    Baseline 135f with Rollator    Time 4    Period Weeks    Status New             PT Long Term Goals - 05/07/20 1653      PT LONG TERM GOAL #1   Title Patient will report independence with final HEP for strengthening/balance/posture (ALL LTGs due: 07/02/20)    Baseline no HEP established    Time 8    Period Weeks    Status New    Target Date 07/02/20      PT LONG TERM GOAL #2   Title Patient will demo ability to ambulate > 300 ft over outdoor paved surfaces with LRAD and superivison for improved community mobility    Baseline outdoor ambulation not assessed to date    Time 8    Period Weeks    Status New      PT LONG TERM GOAL #3   Title Pt will perform TUG test in </= 15 sconds to indicate decreased risk of falling    Baseline 24.75 secs    Time 8    Period Weeks    Status New      PT LONG TERM GOAL #4   Title Patient will improve Berg Balance to >/= 45/56 to demonstrate improved functional standing balance and reduced fall risk.    Baseline 32/56    Time 8    Period Weeks    Status New      PT LONG TERM GOAL #5   Title Patient will improve gait speed to >/= 2.5 ft/sec to demonstrate improved community ambulation    Baseline 1.67 ft/sec    Time 8    Period Weeks    Status New                 Plan - 05/18/20 1114    Clinical Impression Statement Pt was not experiencing any increased pain since fall yesterday. PT focused on fall prevention measures and transfer safety today. Pt does tend to lean back upon initial standing so worked on leaning forward with incorporating more standing balance measures. Pt challenged  with increasing step length especially on right weaker side. Pt continues to benefit from skilled PT to further improve balance, strength and functional mobility to maximize safety.    Personal Factors and Comorbidities Comorbidity 3+;Time since onset of injury/illness/exacerbation;Age    Comorbidities Anxiety, Multiple Myeloma with Thoracic Spinal METS, Stroke (L Basal Ganglia Infarct with R Hemiparesis, Thyroid Disease, Adhesive Capsulitis (L Shoulder), Dysphagia (d/t CVA)    Examination-Activity Limitations Transfers;Stairs;Stand;Locomotion Level;Sit;Bend    Examination-Participation Restrictions Community Activity;Yard Work    SMerchant navy officerEvolving/Moderate complexity    Rehab Potential Good    PT Frequency 2x / week    PT Duration 8 weeks    PT Treatment/Interventions ADLs/Self Care Home Management;Moist Heat;Cryotherapy;Therapeutic activities;DME Instruction;Gait training;Stair training;Functional mobility training;Therapeutic exercise;Balance training;Patient/family education;Orthotic Fit/Training;Neuromuscular re-education;Manual techniques;Passive range of motion;Vestibular    PT Next Visit Plan Continue to progress HEP with seated/standing strengthening and balance when safe. Incorporate more low-level balance exercises. Gait and transfer training.    PT Home Exercise Plan PT to incorporate supine hip strengthening exercises to HEP.    Consulted and Agree with Plan of Care Patient  Patient will benefit from skilled therapeutic intervention in order to improve the following deficits and impairments:  Abnormal gait, Decreased endurance, Decreased balance, Decreased mobility, Difficulty walking, Postural dysfunction, Pain, Decreased strength, Decreased knowledge of use of DME, Decreased activity tolerance, Decreased range of motion  Visit Diagnosis: Other abnormalities of gait and mobility  Muscle weakness (generalized)     Problem List Patient  Active Problem List   Diagnosis Date Noted  . Multiple myeloma not having achieved remission ([place]) 10/24/2015  . Dysphagia as late effect of stroke 07/10/2015  . Diarrhea 04/25/2015  . Cancer associated pain 04/25/2015  . Long term current use of anticoagulant therapy 04/25/2015  . Basal ganglia infarction ([place]) 02/12/2015  . Dizziness   . Expressive aphasia   . Past pointing   . Right leg weakness   . History of stroke 02/11/2015  . CVA (cerebral infarction) 02/11/2015  . Aspiration pneumonia ([place]) 02/11/2015  . Aphasia   . Difficulty speaking   . Speech difficult to understand   . Encounter for antineoplastic chemotherapy 01/31/2015  . Constipation 12/20/2014  . Rash 12/20/2014  . Peripheral edema 12/06/2014  . Multifactorial gait disorder 09/05/2014  . Multiple myeloma ([place])   . Paraplegia ([place]) 06/21/2014  . Postoperative anemia due to acute blood loss 06/21/2014  . 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 Spine metastasis ([place]) 06/17/2014  . Thoracic spine tumor 06/17/2014  . Metastatic bone cancer ([place]) 06/15/2014    Electa Sniff, PT, DPT, NCS 05/18/2020, 11:20 AM  [facility] [address] [place], [place], [address] Phone: [phone]   Fax:  [phone]  Name: Jeremiah Owens MRN: [id number] Date of Birth: 11-08-1929

## 2020-05-21 ENCOUNTER — Ambulatory Visit: Payer: Medicare Other | Admitting: Rehabilitation

## 2020-05-21 ENCOUNTER — Other Ambulatory Visit: Payer: Self-pay

## 2020-05-21 ENCOUNTER — Encounter: Payer: Self-pay | Admitting: Rehabilitation

## 2020-05-21 ENCOUNTER — Ambulatory Visit: Payer: Medicare Other

## 2020-05-21 DIAGNOSIS — R1313 Dysphagia, pharyngeal phase: Secondary | ICD-10-CM

## 2020-05-21 DIAGNOSIS — R293 Abnormal posture: Secondary | ICD-10-CM

## 2020-05-21 DIAGNOSIS — R1312 Dysphagia, oropharyngeal phase: Secondary | ICD-10-CM | POA: Diagnosis not present

## 2020-05-21 DIAGNOSIS — R2689 Other abnormalities of gait and mobility: Secondary | ICD-10-CM | POA: Diagnosis not present

## 2020-05-21 DIAGNOSIS — R2681 Unsteadiness on feet: Secondary | ICD-10-CM | POA: Diagnosis not present

## 2020-05-21 DIAGNOSIS — M6281 Muscle weakness (generalized): Secondary | ICD-10-CM | POA: Diagnosis not present

## 2020-05-21 DIAGNOSIS — R296 Repeated falls: Secondary | ICD-10-CM | POA: Diagnosis not present

## 2020-05-21 NOTE — Patient Instructions (Signed)
Access Code: [email or web address] URL: [email or web address] Date: 05/21/2020 Prepared by: Cameron Sprang  Exercises Sit to Stand with Armchair - 1 x daily - 7 x weekly - 1 sets - 5 reps Seated March - 2 x daily - 5 x weekly - 2 sets - 10 reps Seated Scapular Retraction - 2 x daily - 5 x weekly - 2 sets - 10 reps Seated Hamstring Stretch - 3 x daily - 7 x weekly - 1 sets - 3 reps - 30 sec hold Romberg Stance - 1 x daily - 7 x weekly - 1 sets - 3 reps Wide Stance with Head Rotations and Counter Support - 1 x daily - 7 x weekly - 1 sets - 10 reps Wide Stance with Head Nods and Unilateral Counter Support - 1 x daily - 7 x weekly - 1 sets - 10 reps

## 2020-05-21 NOTE — Therapy (Signed)
[facility] [address] [place], [place], [address] Phone: [phone]   Fax:  [phone]  Physical Therapy Treatment  Patient Details  Name: Jeremiah Owens MRN: [id number] Date of Birth: [birth year] Referring Provider (PT): [name], MD   Encounter Date: 05/21/2020   PT End of Session - 05/21/20 1052    Visit Number 5    Number of Visits 17    Date for PT Re-Evaluation 08/05/20   POC 8 weeks, cert 90 days   Authorization Type Medicare    Progress Note Due on Visit 10    PT Start Time 0933    PT Stop Time 1015    PT Time Calculation (min) 42 min    Equipment Utilized During Treatment Gait belt    Activity Tolerance Patient tolerated treatment well    Behavior During Therapy WFL for tasks assessed/performed           Past Medical History:  Diagnosis Date  . Allergy   . Anxiety   . Bone cancer ([place])    t_spine T-5  . Compression fracture   . Encounter for antineoplastic chemotherapy 01/31/2015  . Hearing loss   . Melanoma ([place])   . Multiple myeloma ([place])   . Prostate cancer ([place]) 2002  . S/P radiation therapy 08/21/14-09/04/14   T4-6 25Gy/42f  . S/P radiation therapy 11/29/14-12/12/14   rt prox humerus/shoulder/scapula 25Gy/136f . Skin cancer 1994   melanoma  right neck   . [place]  . Thyroid disease     Past Surgical History:  Procedure Laterality Date  . HERNIA REPAIR    . POSTERIOR CERVICAL FUSION/FORAMINOTOMY N/A 06/17/2014   Procedure: Thoracic five laminectomy for epidural tumor resection Thoracic 4-7 posterior lateral arthrodesis, segmental pedicle screw fixation.;  Surgeon: KyAshok PallMD;  [facility];  Service: Neurosurgery;  Laterality: N/A;    There were no vitals filed for this visit.   Subjective Assessment - 05/21/20 1032    Subjective Pt reports he is falling approx 1x/wk.  He states he hasn't fallen since last week (primary PT aware) but that it seems to be  random, related to balance at times and other times light headedness.    Pertinent History Anxiety, Multiple Myeloma with Thoracic Spinal METS, Stroke (L Basal Ganglia Infarct with R Hemiparesis, Thyroid Disease, Adhesive Capsulitis (L Shoulder), Dysphagia (d/t CVA    Patient Stated Goals get well; get balance back    Currently in Pain? No/denies                             [facility] 05[facility]21[facility]20 0935      Transfers   Transfers Sit to Stand;Stand to Sit    Sit to Stand 5: Supervision;4: Min guard    Sit to Stand Details Verbal cues for technique;Manual facilitation for weight shifting    Sit to Stand Details (indicate cue type and reason) Continues to provide cues for improved forward weight shift    Stand to Sit 5: Supervision;4: Min guard    Stand to Sit Details (indicate cue type and reason) Verbal cues for technique    Stand to Sit Details Light tactile cues for forward weight shift/trunk lean when sitting for more controlled descent.       Ambulation/Gait   Ambulation/Gait Yes    Ambulation/Gait Assistance 4: Min guard    Ambulation/Gait Assistance Details Gait with rollator during session  x 150' at min/guard level with cues for upright posture and forward gaze, increased step length and improved heel strike.  He is able to demonstrate larger step, however has marked difficulty with heel stike and tends to "scuff" B feet with gait.      Ambulation Distance (Feet) 150 Feet    Assistive device 4-wheeled walker    Gait Pattern Step-through pattern;Decreased step length - right;Decreased step length - left;Decreased dorsiflexion - right;Decreased dorsiflexion - left;Right flexed knee in stance;Left flexed knee in stance;Trunk flexed    Ambulation Surface Level;Indoor      Neuro Re-ed    Neuro Re-ed Details  Added standing balance to HEP standing in front of mat with chair in front of him:  feet narrowed (not all the way together) with EO x 2 sets  of 20 secs, feet shoulder width EO with head turns side/side x 10 reps and up/down x 10 reps.  Pt with more difficulty with horizontal head turns, but improved with practice.  Added these to HEP.  Continued with balance and postural exercise with PWR! step exercise beginning with step only then adding arm reach x 10 reps total.  Pt fatigued quickly with this exercise and had difficulty with smooth step back to midline.  Also performed PWR! Up x 10 reps with heavy cues and facilitation for proper squat technique with B UE support initially during squat progressing to no UE support unless needed.  Performed x 10 reps total.  Ended session with stepping activity forward over 3 barriers (red and black balance beams) with BUE support x 6 laps with cues for landing on heels and stepping over each barrier with each step.  Pt did catch foot a few times.               Access Code: [email or web address] URL: [email or web address] Date: 05/21/2020 Prepared by: Cameron Sprang  Exercises Sit to Stand with Armchair - 1 x daily - 7 x weekly - 1 sets - 5 reps Seated March - 2 x daily - 5 x weekly - 2 sets - 10 reps Seated Scapular Retraction - 2 x daily - 5 x weekly - 2 sets - 10 reps Seated Hamstring Stretch - 3 x daily - 7 x weekly - 1 sets - 3 reps - 30 sec hold Romberg Stance - 1 x daily - 7 x weekly - 1 sets - 3 reps Wide Stance with Head Rotations and Counter Support - 1 x daily - 7 x weekly - 1 sets - 10 reps Wide Stance with Head Nods and Unilateral Counter Support - 1 x daily - 7 x weekly - 1 sets - 10 reps      PT Education - 05/21/20 1051    Education Details additional HEP exercises    Person(s) Educated Patient    Methods Explanation;Demonstration;Handout    Comprehension Verbalized understanding;Returned demonstration            PT Short Term Goals - 05/07/20 1650      PT SHORT TERM GOAL #1   Title Patient will be independent with initial HEP for strengthening/balance/posture (All  STGs Due: 06/04/2020)    Baseline no HEP established    Time 4    Period Weeks    Status New    Target Date 06/04/20      PT SHORT TERM GOAL #2   Title Patient will improve TUG to </= 20 seconds to demonstrate improved mobility and reduced fall risk    Baseline 24.75  secs    Time 4    Period Weeks    Status New      PT SHORT TERM GOAL #3   Title Patient wil improve Berg Balance to >/= 37/56 to demonstrate reduced risk for falls and improved balance.    Baseline 32/56    Time 4    Period Weeks    Status New      PT SHORT TERM GOAL #4   Title Patient will demo ability to ambulate >400 ft with LRAD and supervision level for improved community mobility    Baseline 167f with Rollator    Time 4    Period Weeks    Status New             PT Long Term Goals - 05/07/20 1653      PT LONG TERM GOAL #1   Title Patient will report independence with final HEP for strengthening/balance/posture (ALL LTGs due: 07/02/20)    Baseline no HEP established    Time 8    Period Weeks    Status New    Target Date 07/02/20      PT LONG TERM GOAL #2   Title Patient will demo ability to ambulate > 300 ft over outdoor paved surfaces with LRAD and superivison for improved community mobility    Baseline outdoor ambulation not assessed to date    Time 8    Period Weeks    Status New      PT LONG TERM GOAL #3   Title Pt will perform TUG test in </= 15 sconds to indicate decreased risk of falling    Baseline 24.75 secs    Time 8    Period Weeks    Status New      PT LONG TERM GOAL #4   Title Patient will improve Berg Balance to >/= 45/56 to demonstrate improved functional standing balance and reduced fall risk.    Baseline 32/56    Time 8    Period Weeks    Status New      PT LONG TERM GOAL #5   Title Patient will improve gait speed to >/= 2.5 ft/sec to demonstrate improved community ambulation    Baseline 1.67 ft/sec    Time 8    Period Weeks    Status New                  Plan - 05/21/20 1052    Clinical Impression Statement Skilled session focused on adding simple standing balance exercises for HEP.  Also continued to work on dynamic balance and postural exercises during session.  Pt fatigues quickly with exercises.   Also educated on LE swelling as pt reports RLE swelling>LLE and PT did note this as well.  Recommended a light compression stocking, propping feet up when resting, and performing ankle motions during commercial breaks at home. Pt verbalized understanding.    Personal Factors and Comorbidities Comorbidity 3+;Time since onset of injury/illness/exacerbation;Age    Comorbidities Anxiety, Multiple Myeloma with Thoracic Spinal METS, Stroke (L Basal Ganglia Infarct with R Hemiparesis, Thyroid Disease, Adhesive Capsulitis (L Shoulder), Dysphagia (d/t CVA)    Examination-Activity Limitations Transfers;Stairs;Stand;Locomotion Level;Sit;Bend    Examination-Participation Restrictions Community Activity;Yard Work    SMerchant navy officerEvolving/Moderate complexity    Rehab Potential Good    PT Frequency 2x / week    PT Duration 8 weeks    PT Treatment/Interventions ADLs/Self Care Home Management;Moist Heat;Cryotherapy;Therapeutic activities;DME Instruction;Gait training;Stair training;Functional mobility training;Therapeutic exercise;Balance training;Patient/family  education;Orthotic Fit/Training;Neuromuscular re-education;Manual techniques;Passive range of motion;Vestibular    PT Next Visit Plan How are balance additions going to HEP (modify if needed) Continue to progress HEP with seated/standing strengthening. Incorporate more low-level balance exercises. Gait and transfer training.    PT Home Exercise Plan PT to incorporate supine hip strengthening exercises to HEP.    Consulted and Agree with Plan of Care Patient           Patient will benefit from skilled therapeutic intervention in order to improve the following deficits and impairments:   Abnormal gait, Decreased endurance, Decreased balance, Decreased mobility, Difficulty walking, Postural dysfunction, Pain, Decreased strength, Decreased knowledge of use of DME, Decreased activity tolerance, Decreased range of motion  Visit Diagnosis: Other abnormalities of gait and mobility  Muscle weakness (generalized)  Unsteadiness on feet  Abnormal posture     Problem List Patient Active Problem List   Diagnosis Date Noted  . Multiple myeloma not having achieved remission ([place]) 10/24/2015  . Dysphagia as late effect of stroke 07/10/2015  . Diarrhea 04/25/2015  . Cancer associated pain 04/25/2015  . Long term current use of anticoagulant therapy 04/25/2015  . Basal ganglia infarction ([place]) 02/12/2015  . Dizziness   . Expressive aphasia   . Past pointing   . Right leg weakness   . History of stroke 02/11/2015  . CVA (cerebral infarction) 02/11/2015  . Aspiration pneumonia ([place]) 02/11/2015  . Aphasia   . Difficulty speaking   . Speech difficult to understand   . Encounter for antineoplastic chemotherapy 01/31/2015  . Constipation 12/20/2014  . Rash 12/20/2014  . Peripheral edema 12/06/2014  . Multifactorial gait disorder 09/05/2014  . Multiple myeloma ([place])   . Paraplegia ([place]) 06/21/2014  . Postoperative anemia due to acute blood loss 06/21/2014  . 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 Spine metastasis ([place]) 06/17/2014  . Thoracic spine tumor 06/17/2014  . Metastatic bone cancer ([place]) 06/15/2014    Jeremiah Owens, Jeremiah Owens 05/21/2020, 10:56 AM  [facility] [address] [place], [place], [address] Phone: [phone]   Fax:  [phone]  Name: Jeremiah Owens MRN: [id number] Date of Birth: February 18, 1930

## 2020-05-21 NOTE — Patient Instructions (Addendum)
   DURING EVERY MEAL:  Small bites and sips Swallow 2-3 times for each bite or sip Clear throat and swallow after you are done with a bite or sip  -Take medication with puree     So, each bite or sip:  Swallow it down  Swallow once-twice more Clear your throat and swallow another time

## 2020-05-21 NOTE — Therapy (Signed)
[facility] [address] [place], [place], [address] Phone: [phone]   Fax:  [phone]  Speech Language Pathology Evaluation  Patient Details  Name: Jeremiah Owens MRN: [id number] Date of Birth: [birth year] Referring Provider (SLP): [name], MD   Encounter Date: 05/21/2020   End of Session - 05/21/20 1655    Visit Number 1    Number of Visits 17    Date for SLP Re-Evaluation 08/17/20   90 days   SLP Start Time 0851    SLP Stop Time  0931    SLP Time Calculation (min) 40 min    Activity Tolerance Patient tolerated treatment well           Past Medical History:  Diagnosis Date  . Allergy   . Anxiety   . Bone cancer ([place])    t_spine T-5  . Compression fracture   . Encounter for antineoplastic chemotherapy 01/31/2015  . Hearing loss   . Melanoma ([place])   . Multiple myeloma ([place])   . Prostate cancer ([place]) 2002  . S/P radiation therapy 08/21/14-09/04/14   T4-6 25Gy/42f  . S/P radiation therapy 11/29/14-12/12/14   rt prox humerus/shoulder/scapula 25Gy/185f . Skin cancer 1994   melanoma  right neck   . Stroke ([place]  . Thyroid disease     Past Surgical History:  Procedure Laterality Date  . HERNIA REPAIR    . POSTERIOR CERVICAL FUSION/FORAMINOTOMY N/A 06/17/2014   Procedure: Thoracic five laminectomy for epidural tumor resection Thoracic 4-7 posterior lateral arthrodesis, segmental pedicle screw fixation.;  Surgeon: KyAshok PallMD;  [facility];  Service: Neurosurgery;  Laterality: N/A;    There were no vitals filed for this visit.   Subjective Assessment - 05/21/20 0900    Subjective Pt was seen in this clinic after his CVA in 2016. Wife states    Currently in Pain? No/denies              [facility] [id number]    SLP Visit Information   SLP Received On 05/21/20    Referring Provider (SLP) SwAlger SimonsMD    Onset Date 2016    Medical Diagnosis  Dysphagia, S/P CVA      Subjective   Subjective [facility] "w[facility]" [facility]     Patient/Family Stated Goal Get more information about swallowing      Pain Assessment   Currently in Pain? No/denies      General Information   HPI on 05-14-20 - due to wife reports frequent coughing with thin liquids. He has a history of dysphagia from CVA 2016 with silent aspiration. PMH: GERD, multiple myeloma, prostate cancer s/p radiation. Cervical CT revealed Disc space narrowing and endplate spurring throughout most prominent at C4-C5. Results of modified were adequate timing and coordination swallow with reduced tongue base retraction, decr'd pharyngeal contraction and SILENT ASPIRATION due to incomplete layrngeal closure with thin x1. Follow up was not recommended.       Balance Screen   Has the patient fallen in the past 6 months Yes    How many times? "at least 6"   pt currently in PT     Prior Functional Status   Cognitive/Linguistic Baseline Within functional limits      Cognition   Overall Cognitive Status Within Functional Limits for tasks assessed      Auditory Comprehension   Overall Auditory Comprehension Appears within functional limits  for tasks assessed      Verbal Expression   Overall Verbal Expression Appears within functional limits for tasks assessed      Oral Motor/Sensory Function   Overall Oral Motor/Sensory Function Impaired    Labial ROM Within Functional Limits    Labial Symmetry Abnormal symmetry right   mild droop   Labial Strength Reduced    Labial Coordination WFL    Lingual ROM Within Functional Limits    Lingual Symmetry Within Functional Limits    Lingual Strength Reduced    Lingual Coordination WFL    Facial Symmetry Right droop    Overall Oral Motor/Sensory Function Pt reports drooling "several times a day". Pt was unaware of his hydrophonic voice. Pt did note that after a meal he will need to expectorate due to excessive saliva.  SLP explained that pts saliva glands are still working and that he hsould cont to swallow after he is done with his meal for approx 5 minutes.       Motor Speech   Overall Motor Speech Appears within functional limits for tasks assessed                           SLP Education - 05/21/20 1654    Education Details swallow precautions, MBSS results    Person(s) Educated Patient;Spouse    Methods Explanation;Demonstration;Verbal cues;Handout    Comprehension Verbalized understanding;Returned demonstration;Verbal cues required;Need further instruction              SLP Long Term Goals - 05/21/20 1709      SLP LONG TERM GOAL #1   Title pt will adequately follow swallow precautions with POs with min A from SLP    Time 8    Period Weeks   or 17 sessions, for all LTGs   Status New      SLP LONG TERM GOAL #2   Title pt will demo HEP for swallowing with rare min A from SLP or family x 2 sessions (if pt desires to work on [employer])    Time 8    Period Weeks    Status New      SLP LONG TERM GOAL #3   Title pt will adequately follow swallow precautions with POs with min A from wife in 2 sessions    Time [place] - 05/21/20 1656    Clinical Impression Statement Jeremiah Owens is known to this SLP from previous course of therapy for dysphagia, aphasia and/or verbal apraxia - now almost 6 years ago and pt was discharged due to max potential reached after over 20 visits and pt stating he could complete exercises at home. At that time pt chose not to complete dysphagia HEP for deficit areas as directed/suggested by SLP. Today, wife and pt agree coughing does not occur less after MBSS. In evaluation today, SLP noted oral weakness. SLP reviewed results and recommendations from MBSS and pt with total A necessary for following precuations with POs (soft and moist muffin with water). Pt "wet" voice was addressed x4 times during session and pt  req'd cues for awareness each time with SLP directly pointing out the autditory cue of "gurgly sound" and tactile cue of "a popping feeling" in pt's throat indicative of hydrophonia. Pt's wife demonstrated incr'd level of comfort in cueing pt to follow precautions, which were  written out for pt in both list form and sequenced form (See "pt instructions"). SLP gave pt choice if he wanted to have a better chance to improve his swallowing he should consider performing swallowing HEP. Pt to tell SLP if he desires this next session and pt will be incr'd to x2/week. Currenlty he is scheduled x1/week to ensure his best potential for using aspiration precautions with POs, and to cont to educate wife on cueing strategies for pt to use aspiration precautions.    Speech Therapy Frequency 2x / week    Duration 8 weeks   or 17 sessoins   Treatment/Interventions Aspiration precaution training;Pharyngeal strengthening exercises;Diet toleration management by SLP;Trials of upgraded texture/liquids;Cueing hierarchy;SLP instruction and feedback;Compensatory strategies;Internal/external aids;Patient/family education    Potential to Achieve Goals Fair    Potential Considerations Previous level of function;Cooperation/participation level;Severity of impairments    Consulted and Agree with Plan of Care Patient           Patient will benefit from skilled therapeutic intervention in order to improve the following deficits and impairments:   Dysphagia, pharyngeal phase    Problem List Patient Active Problem List   Diagnosis Date Noted  . Multiple myeloma not having achieved remission ([place]) 10/24/2015  . Dysphagia as late effect of stroke 07/10/2015  . Diarrhea 04/25/2015  . Cancer associated pain 04/25/2015  . Long term current use of anticoagulant therapy 04/25/2015  . Basal ganglia infarction ([place]) 02/12/2015  . Dizziness   . Expressive aphasia   . Past pointing   . Right leg weakness   . History of stroke  02/11/2015  . CVA (cerebral infarction) 02/11/2015  . Aspiration pneumonia ([place]) 02/11/2015  . Aphasia   . Difficulty speaking   . Speech difficult to understand   . Encounter for antineoplastic chemotherapy 01/31/2015  . Constipation 12/20/2014  . Rash 12/20/2014  . Peripheral edema 12/06/2014  . Multifactorial gait disorder 09/05/2014  . Multiple myeloma ([place])   . Paraplegia ([place]) 06/21/2014  . Postoperative anemia due to acute blood loss 06/21/2014  . 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 Spine metastasis ([place]) 06/17/2014  . Thoracic spine tumor 06/17/2014  . Metastatic bone cancer ([place]) 06/15/2014    [facility] ,[place], [place]  05/21/2020, 5:12 PM  [facility] [address] [place], [place], [address] Phone: [phone]   Fax:  [phone]  Name: Jeremiah Owens MRN: [id number] Date of Birth: 09-Aug-1929

## 2020-05-23 DIAGNOSIS — Z23 Encounter for immunization: Secondary | ICD-10-CM | POA: Diagnosis not present

## 2020-05-25 ENCOUNTER — Other Ambulatory Visit: Payer: Self-pay

## 2020-05-25 ENCOUNTER — Ambulatory Visit: Payer: Medicare Other

## 2020-05-25 DIAGNOSIS — R293 Abnormal posture: Secondary | ICD-10-CM

## 2020-05-25 DIAGNOSIS — R2681 Unsteadiness on feet: Secondary | ICD-10-CM | POA: Diagnosis not present

## 2020-05-25 DIAGNOSIS — M6281 Muscle weakness (generalized): Secondary | ICD-10-CM

## 2020-05-25 DIAGNOSIS — R2689 Other abnormalities of gait and mobility: Secondary | ICD-10-CM

## 2020-05-25 DIAGNOSIS — R296 Repeated falls: Secondary | ICD-10-CM

## 2020-05-25 DIAGNOSIS — R1312 Dysphagia, oropharyngeal phase: Secondary | ICD-10-CM | POA: Diagnosis not present

## 2020-05-25 NOTE — Therapy (Signed)
[facility] [address] [place], [place], [address] Phone: [phone]   Fax:  [phone]  Physical Therapy Treatment  Patient Details  Name: Jeremiah Owens MRN: [id number] Date of Birth: 1929-12-13 Referring Provider (PT): [name], MD   Encounter Date: 05/25/2020   PT End of Session - 05/25/20 0938    Visit Number 6    Number of Visits 17    Date for PT Re-Evaluation 08/05/20   POC 8 weeks, cert 90 days   Authorization Type Medicare    Progress Note Due on Visit 10    PT Start Time 0933    PT Stop Time 1015    PT Time Calculation (min) 42 min    Equipment Utilized During Treatment Gait belt    Activity Tolerance Patient tolerated treatment well    Behavior During Therapy [facility] for tasks assessed/performed           Past Medical History:  Diagnosis Date   Allergy    Anxiety    Bone cancer ([place])    t_spine T-5   Compression fracture    Encounter for antineoplastic chemotherapy 01/31/2015   Hearing loss    Melanoma ([place])    Multiple myeloma ([place])    Prostate cancer ([place]) 2002   S/P radiation therapy 08/21/14-09/04/14   T4-6 25Gy/33f   S/P radiation therapy 11/29/14-12/12/14   rt prox humerus/shoulder/scapula 25Gy/135f  Skin cancer 1994   melanoma  right neck    [place]   Thyroid disease     Past Surgical History:  Procedure Laterality Date   HERNIA REPAIR     POSTERIOR CERVICAL FUSION/FORAMINOTOMY N/A 06/17/2014   Procedure: Thoracic five laminectomy for epidural tumor resection Thoracic 4-7 posterior lateral arthrodesis, segmental pedicle screw fixation.;  Surgeon: KyAshok PallMD;  Location: [facility];  Service: Neurosurgery;  Laterality: N/A;    There were no vitals filed for this visit.   Subjective Assessment - 05/25/20 0938    Subjective Patient reports no new falls to report. Patient reports wearing a whistle so that wife can hear him when he falls. No other  new complaints noted.    Pertinent History Anxiety, Multiple Myeloma with Thoracic Spinal METS, Stroke (L Basal Ganglia Infarct with R Hemiparesis, Thyroid Disease, Adhesive Capsulitis (L Shoulder), Dysphagia (d/t CVA    Patient Stated Goals get well; get balance back    Currently in Pain? No/denies                             [facility] Adult PT Treatment/Exercise - 05/25/20 0001      Transfers   Transfers Sit to Stand;Stand to Sit    Sit to Stand 5: Supervision;4: Min guard    Sit to Stand Details Verbal cues for technique;Manual facilitation for weight shifting    Sit to Stand Details (indicate cue type and reason) continued verbal cues for improved forward lean to promote improve technique with sit <> stand    Stand to Sit 5: Supervision;4: Min guard    Stand to Sit Details (indicate cue type and reason) Verbal cues for technique      Ambulation/Gait   Ambulation/Gait Yes    Ambulation/Gait Assistance 4: Min guard    Ambulation/Gait Assistance Details Patient ambulating x 340 ft with rollator, mild SOB after completion (3/10). HR: 88, O2Sat: 99%. Decreased toe clearance still noted on RLE, requiring verbal cues for improved step length throughout  ambulation.     Ambulation Distance (Feet) 340 Feet    Assistive device Rollator    Gait Pattern Step-through pattern;Decreased step length - right;Decreased step length - left;Decreased dorsiflexion - right;Decreased dorsiflexion - left;Right flexed knee in stance;Left flexed knee in stance;Trunk flexed    Ambulation Surface Level;Indoor      Neuro Re-ed    Neuro Re-ed Details  Completed standing balance standing in front of chair, without UE support completed horizontal/vertical head turns 1 x 10 reps. Standing in // bars, completed alternating step forward steps over black balance beam x 10 reps bilaterally, increased difficulty with ant/post weight shift noted and requiring CGA and BUE support for completion. Standing without  UE support completed reaching for cones placed on table on R side, completed 2 x 8 cones. increased balance challenge noted. PT providing verbal cues for improved forward lean and weight shift CGA required throughout. Patient require intermittent rest breaks due to fatigue in BLE's.                     PT Short Term Goals - 05/07/20 1650      PT SHORT TERM GOAL #1   Title Patient will be independent with initial HEP for strengthening/balance/posture (All STGs Due: 06/04/2020)    Baseline no HEP established    Time 4    Period Weeks    Status New    Target Date 06/04/20      PT SHORT TERM GOAL #2   Title Patient will improve TUG to </= 20 seconds to demonstrate improved mobility and reduced fall risk    Baseline 24.75 secs    Time 4    Period Weeks    Status New      PT SHORT TERM GOAL #3   Title Patient wil improve Berg Balance to >/= 37/56 to demonstrate reduced risk for falls and improved balance.    Baseline 32/56    Time 4    Period Weeks    Status New      PT SHORT TERM GOAL #4   Title Patient will demo ability to ambulate >400 ft with LRAD and supervision level for improved community mobility    Baseline 179f with Rollator    Time 4    Period Weeks    Status New             PT Long Term Goals - 05/07/20 1653      PT LONG TERM GOAL #1   Title Patient will report independence with final HEP for strengthening/balance/posture (ALL LTGs due: 07/02/20)    Baseline no HEP established    Time 8    Period Weeks    Status New    Target Date 07/02/20      PT LONG TERM GOAL #2   Title Patient will demo ability to ambulate > 300 ft over outdoor paved surfaces with LRAD and superivison for improved community mobility    Baseline outdoor ambulation not assessed to date    Time 8    Period Weeks    Status New      PT LONG TERM GOAL #3   Title Pt will perform TUG test in </= 15 sconds to indicate decreased risk of falling    Baseline 24.75 secs    Time 8     Period Weeks    Status New      PT LONG TERM GOAL #4   Title Patient will improve Berg Balance to >/=  45/56 to demonstrate improved functional standing balance and reduced fall risk.    Baseline 32/56    Time 8    Period Weeks    Status New      PT LONG TERM GOAL #5   Title Patient will improve gait speed to >/= 2.5 ft/sec to demonstrate improved community ambulation    Baseline 1.67 ft/sec    Time 8    Period Weeks    Status New                 Plan - 05/25/20 1121    Clinical Impression Statement Today's skilled PT session focused on continued balance exercises and postural exercises as tolerated by patient. Continued ambulation with rollator x 340 ft with verbal cues required for improved step length. Intermittent rest breaks required with exercises and ambulation, with mild SOB. vitals normal throughout session.    Personal Factors and Comorbidities Comorbidity 3+;Time since onset of injury/illness/exacerbation;Age    Comorbidities Anxiety, Multiple Myeloma with Thoracic Spinal METS, Stroke (L Basal Ganglia Infarct with R Hemiparesis, Thyroid Disease, Adhesive Capsulitis (L Shoulder), Dysphagia (d/t CVA)    Examination-Activity Limitations Transfers;Stairs;Stand;Locomotion Level;Sit;Bend    Examination-Participation Restrictions Community Activity;Yard Work    [employer]chant [employer] officer Evolving/Moderate complexity    Rehab Potential Good    PT Frequency 2x / week    PT Duration 8 weeks    PT Treatment/Interventions ADLs/Self Care Home Management;Moist Heat;Cryotherapy;Therapeutic activities;DME Instruction;Gait training;Stair training;Functional mobility training;Therapeutic exercise;Balance training;Patient/family education;Orthotic Fit/Training;Neuromuscular re-education;Manual techniques;Passive range of motion;Vestibular    PT Next Visit Plan Continue to progress HEP with seated/standing strengthening. Incorporate more low-level balance exercises. Gait and  transfer training.    PT Home Exercise Plan PT to incorporate supine hip strengthening exercises to HEP.    Consulted and Agree with Plan of Care Patient           Patient will benefit from skilled therapeutic intervention in order to improve the following deficits and impairments:  Abnormal gait, Decreased endurance, Decreased balance, Decreased mobility, Difficulty walking, Postural dysfunction, Pain, Decreased strength, Decreased knowledge of use of DME, Decreased activity tolerance, Decreased range of motion  Visit Diagnosis: Other abnormalities of gait and mobility  Muscle weakness (generalized)  Unsteadiness on feet  Abnormal posture  Repeated falls     Problem List Patient Active Problem List   Diagnosis Date Noted   Multiple myeloma not having achieved remission ([place]) 10/24/2015   Dysphagia as late effect of stroke 07/10/2015   Diarrhea 04/25/2015   Cancer associated pain 04/25/2015   Long term current use of anticoagulant therapy 04/25/2015   Basal ganglia infarction ([place]) 02/12/2015   Dizziness    Expressive aphasia    Past pointing    Right leg weakness    History of stroke 02/11/2015   CVA (cerebral infarction) 02/11/2015   Aspiration pneumonia ([place]) 02/11/2015   Aphasia    Difficulty speaking    Speech difficult to understand    Encounter for antineoplastic chemotherapy 01/31/2015   Constipation 12/20/2014   Rash 12/20/2014   Peripheral edema 12/06/2014   Multifactorial gait disorder 09/05/2014   Multiple myeloma ([place])    Paraplegia ([place]) 06/21/2014   Postoperative anemia due to acute blood loss 06/21/2014   Neoplasm of thoracic spine 06/20/2014   Spine metastasis ([place]) 06/17/2014   Thoracic spine tumor 06/17/2014   Metastatic bone cancer ([place]) 06/15/2014    Jones Bales, PT, DPT 05/25/2020, 11:24 AM  [facility]  [place], [place],  [address] Phone: [phone]   Fax:  [phone]  Name: Jeremiah Owens MRN: [id number] Date of Birth: 09/26/1929

## 2020-05-30 ENCOUNTER — Ambulatory Visit: Payer: Medicare Other

## 2020-05-30 ENCOUNTER — Other Ambulatory Visit: Payer: Self-pay

## 2020-05-30 DIAGNOSIS — R2681 Unsteadiness on feet: Secondary | ICD-10-CM

## 2020-05-30 DIAGNOSIS — R1312 Dysphagia, oropharyngeal phase: Secondary | ICD-10-CM | POA: Diagnosis not present

## 2020-05-30 DIAGNOSIS — M6281 Muscle weakness (generalized): Secondary | ICD-10-CM

## 2020-05-30 DIAGNOSIS — R293 Abnormal posture: Secondary | ICD-10-CM

## 2020-05-30 DIAGNOSIS — R296 Repeated falls: Secondary | ICD-10-CM | POA: Diagnosis not present

## 2020-05-30 DIAGNOSIS — R2689 Other abnormalities of gait and mobility: Secondary | ICD-10-CM

## 2020-05-30 NOTE — Therapy (Signed)
[facility] [address] [place], [place], [address] Phone: [phone]   Fax:  [phone]  Physical Therapy Treatment  Patient Details  Name: Jeremiah Owens MRN: [id number] Date of Birth: 28-Dec-1929 Referring Provider (PT): [name], MD   Encounter Date: 05/30/2020   PT End of Session - 05/30/20 0938    Visit Number 7    Number of Visits 17    Date for PT Re-Evaluation 08/05/20   POC 8 weeks, cert 90 days   Authorization Type Medicare    Progress Note Due on Visit 10    PT Start Time 0932    PT Stop Time 7622    PT Time Calculation (min) 42 min    Equipment Utilized During Treatment Gait belt    Activity Tolerance Patient tolerated treatment well    Behavior During Therapy [facility] for tasks assessed/performed           Past Medical History:  Diagnosis Date   Allergy    Anxiety    Bone cancer ([place])    t_spine T-5   Compression fracture    Encounter for antineoplastic chemotherapy 01/31/2015   Hearing loss    Melanoma ([place])    Multiple myeloma ([place])    Prostate cancer ([place]) 2002   S/P radiation therapy 08/21/14-09/04/14   T4-6 25Gy/61f   S/P radiation therapy 11/29/14-12/12/14   rt prox humerus/shoulder/scapula 25Gy/154f  Skin cancer 1994   melanoma  right neck    [place]   Thyroid disease     Past Surgical History:  Procedure Laterality Date   HERNIA REPAIR     POSTERIOR CERVICAL FUSION/FORAMINOTOMY N/A 06/17/2014   Procedure: Thoracic five laminectomy for epidural tumor resection Thoracic 4-7 posterior lateral arthrodesis, segmental pedicle screw fixation.;  Surgeon: KyAshok PallMD;  Location: [facility];  Service: Neurosurgery;  Laterality: N/A;    There were no vitals filed for this visit.   Subjective Assessment - 05/30/20 0937    Subjective Patient still reports swelling in leg, and is elevated throughout the day. No fall since last visit. Patient does report some  near falls but has been able to catch balance.    Pertinent History Anxiety, Multiple Myeloma with Thoracic Spinal METS, Stroke (L Basal Ganglia Infarct with R Hemiparesis, Thyroid Disease, Adhesive Capsulitis (L Shoulder), Dysphagia (d/t CVA    Patient Stated Goals get well; get balance back    Currently in Pain? No/denies               [facility] Adult PT Treatment/Exercise - 05/30/20 0001      Transfers   Transfers Sit to Stand;Stand to Sit    Sit to Stand 5: Supervision;4: Min guard    Sit to Stand Details Verbal cues for technique;Manual facilitation for weight shifting    Stand to Sit 5: Supervision;4: Min guard    Stand to Sit Details (indicate cue type and reason) Verbal cues for technique    Comments with mirror for visual feed completed sit <> stand training with rollator in front. patient demo mild imabalnce with transitoin of hands from mat to rollator. PT educaitng on placing RUE on rollator and push up with LUE form mat balance. Completed x 10 reps.       Ambulation/Gait   Ambulation/Gait Yes    Ambulation/Gait Assistance 4: Min guard    Ambulation/Gait Assistance Details ambulation with rollator 200 x 1, 100 x 1.  verbal cues for improved step length and using  scuffing of shoe as feedback for improved toe clearance with ambulation.     Ambulation Distance (Feet) 200 Feet   x1 , 100 x 1   Assistive device Rollator    Gait Pattern Step-through pattern;Decreased step length - right;Decreased step length - left;Decreased dorsiflexion - right;Decreased dorsiflexion - left;Right flexed knee in stance;Left flexed knee in stance;Trunk flexed    Ambulation Surface Level;Indoor      Self-Care   Self-Care Other Self-Care Comments    Other Self-Care Comments  PT assessed RLE due to reports of swelling that is not improving. Upon assessment patient demonstrating swelling in calf region on RLE with pitting edema and redness. No reports of pain/tenderness in the area with palpation. PT  educating on concern for potenital cellulitis or infection and would benefit from being assesed from PCP. PT educating wife on calling PCP to get appointment scheduled, as well as PT will send message to PCP informing of concerns. Patient and wife verbalzied understanding      Neuro Re-ed    Neuro Re-ed Details  In // bars standing on blue mat: completed static standing with narrow BOS and eyes closed 3 x 30 seconds, and vertical head turns 1 x 15 reps, verbal cues for improved posture with completion. On firm surface, completed alternating matching x 10 reps with BUE support, progressed to completing x 5 reps with single UE, and x 5 reps without UE support. Increased challenge noted with alternating marching without UE support.                   PT Education - 05/30/20 1202    Education Details Educated on RLE swelling (see self care)    Person(s) Educated Patient;Spouse    Methods Explanation    Comprehension Verbalized understanding            PT Short Term Goals - 05/07/20 1650      PT SHORT TERM GOAL #1   Title Patient will be independent with initial HEP for strengthening/balance/posture (All STGs Due: 06/04/2020)    Baseline no HEP established    Time 4    Period Weeks    Status New    Target Date 06/04/20      PT SHORT TERM GOAL #2   Title Patient will improve TUG to </= 20 seconds to demonstrate improved mobility and reduced fall risk    Baseline 24.75 secs    Time 4    Period Weeks    Status New      PT SHORT TERM GOAL #3   Title Patient wil improve Berg Balance to >/= 37/56 to demonstrate reduced risk for falls and improved balance.    Baseline 32/56    Time 4    Period Weeks    Status New      PT SHORT TERM GOAL #4   Title Patient will demo ability to ambulate >400 ft with LRAD and supervision level for improved community mobility    Baseline 126f with Rollator    Time 4    Period Weeks    Status New             PT Long Term Goals - 05/07/20  1653      PT LONG TERM GOAL #1   Title Patient will report independence with final HEP for strengthening/balance/posture (ALL LTGs due: 07/02/20)    Baseline no HEP established    Time 8    Period Weeks    Status New  Target Date 07/02/20      PT LONG TERM GOAL #2   Title Patient will demo ability to ambulate > 300 ft over outdoor paved surfaces with LRAD and superivison for improved community mobility    Baseline outdoor ambulation not assessed to date    Time 8    Period Weeks    Status New      PT LONG TERM GOAL #3   Title Pt will perform TUG test in </= 15 sconds to indicate decreased risk of falling    Baseline 24.75 secs    Time 8    Period Weeks    Status New      PT LONG TERM GOAL #4   Title Patient will improve Berg Balance to >/= 45/56 to demonstrate improved functional standing balance and reduced fall risk.    Baseline 32/56    Time 8    Period Weeks    Status New      PT LONG TERM GOAL #5   Title Patient will improve gait speed to >/= 2.5 ft/sec to demonstrate improved community ambulation    Baseline 1.67 ft/sec    Time 8    Period Weeks    Status New                 Plan - 05/30/20 1206    Clinical Impression Statement Spent initial portion of session assessing RLE edema and redness, PT educaitng patient and wife on getting assessed by PCP. Continued ambulation througohut session with rollator, as well as contineud task specific training of sit <> stands. Will continue to progress toward all LTGs.    Personal Factors and Comorbidities Comorbidity 3+;Time since onset of injury/illness/exacerbation;Age    Comorbidities Anxiety, Multiple Myeloma with Thoracic Spinal METS, Stroke (L Basal Ganglia Infarct with R Hemiparesis, Thyroid Disease, Adhesive Capsulitis (L Shoulder), Dysphagia (d/t CVA)    Examination-Activity Limitations Transfers;Stairs;Stand;Locomotion Level;Sit;Bend    Examination-Participation Restrictions Community Activity;Yard Work     [employer]chant [employer] officer Evolving/Moderate complexity    Rehab Potential Good    PT Frequency 2x / week    PT Duration 8 weeks    PT Treatment/Interventions ADLs/Self Care Home Management;Moist Heat;Cryotherapy;Therapeutic activities;DME Instruction;Gait training;Stair training;Functional mobility training;Therapeutic exercise;Balance training;Patient/family education;Orthotic Fit/Training;Neuromuscular re-education;Manual techniques;Passive range of motion;Vestibular    PT Next Visit Plan Did we get appt with PCP? Continue to progress HEP with seated/standing strengthening. Incorporate more low-level balance exercises. Gait and transfer training.    PT Home Exercise Plan PT to incorporate supine hip strengthening exercises to HEP.    Consulted and Agree with Plan of Care Patient           Patient will benefit from skilled therapeutic intervention in order to improve the following deficits and impairments:  Abnormal gait, Decreased endurance, Decreased balance, Decreased mobility, Difficulty walking, Postural dysfunction, Pain, Decreased strength, Decreased knowledge of use of DME, Decreased activity tolerance, Decreased range of motion  Visit Diagnosis: Other abnormalities of gait and mobility  Muscle weakness (generalized)  Unsteadiness on feet  Abnormal posture     Problem List Patient Active Problem List   Diagnosis Date Noted   Multiple myeloma not having achieved remission ([place]) 10/24/2015   Dysphagia as late effect of stroke 07/10/2015   Diarrhea 04/25/2015   Cancer associated pain 04/25/2015   Long term current use of anticoagulant therapy 04/25/2015   Basal ganglia infarction ([place]) 02/12/2015   Dizziness    Expressive aphasia    Past pointing    Right  leg weakness    History of stroke 02/11/2015   CVA (cerebral infarction) 02/11/2015   Aspiration pneumonia ([place]) 02/11/2015   Aphasia    Difficulty speaking    Speech difficult to  understand    Encounter for antineoplastic chemotherapy 01/31/2015   Constipation 12/20/2014   Rash 12/20/2014   Peripheral edema 12/06/2014   Multifactorial gait disorder 09/05/2014   Multiple myeloma ([place])    Paraplegia ([place]) 06/21/2014   Postoperative anemia due to acute blood loss 06/21/2014   Neoplasm of thoracic spine 06/20/2014   Spine metastasis ([place]) 06/17/2014   Thoracic spine tumor 06/17/2014   Metastatic bone cancer ([place]) 06/15/2014    Jones Bales, PT, DPT 05/30/2020, 12:09 PM  [facility] [address] [place], [place], [address] Phone: [phone]   Fax:  [phone]  Name: Samael Blades MRN: [id number] Date of Birth: 11/17/1929

## 2020-05-30 NOTE — Patient Instructions (Signed)
SWALLOWING EXERCISES Do these twice a day until Christmas Day  1. Effortful Swallows - Press your tongue against the roof of your mouth for 3 seconds, then squeeze the muscles in your neck while you swallow your saliva or a sip of water - Repeat 10-15 times, and use whenever you eat or drink  2. [place]ako Swallow - swallow with your tongue sticking out - Stick tongue out past your lips and gently bite tongue with your teeth - Swallow (while holding your tongue with your teeth) - Repeat 10-15 times *use a wet spoon if your mouth gets dry*  3. Towel hold - Hold a towel rolled up approx 3" diameter between your chin and chest - Press down hard for 45 seconds - 3 times  4. Mendelsohn Maneuver - half swallow exercise - Start to swallow, and keep your Adams apple up by squeezing hard with the muscles of the throat - Hold the squeeze for 5-7 seconds and then relax - Repeat 10-15 times *use a wet spoon if your mouth gets dry*        5.    "Super Swallow"  - Take a breath and hold it  - Bear down  - Swallow then IMMEDIATELY cough  - Repeat 10 times

## 2020-05-30 NOTE — Therapy (Signed)
[facility] [address] [place], [place], [address] Phone: [phone]   Fax:  [phone]  Speech Language Pathology Treatment  Patient Details  Name: Jeremiah Owens MRN: [id number] Date of Birth: Jun 21, 1930 Referring Provider (SLP): [name], MD   Encounter Date: 05/30/2020   End of Session - 05/30/20 1054    Visit Number 2    Number of Visits 17    Date for SLP Re-Evaluation 08/17/20   90 days   SLP Start Time 23    SLP Stop Time  1100    SLP Time Calculation (min) 40 min    Activity Tolerance Patient tolerated treatment well           Past Medical History:  Diagnosis Date   Allergy    Anxiety    Bone cancer ([place])    t_spine T-5   Compression fracture    Encounter for antineoplastic chemotherapy 01/31/2015   Hearing loss    Melanoma ([place])    Multiple myeloma ([place])    Prostate cancer ([place]) 2002   S/P radiation therapy 08/21/14-09/04/14   T4-6 25Gy/62f   S/P radiation therapy 11/29/14-12/12/14   rt prox humerus/shoulder/scapula 25Gy/176f  Skin cancer 1994   melanoma  right neck    [facility]   Thyroid disease     Past Surgical History:  Procedure Laterality Date   HERNIA REPAIR     POSTERIOR CERVICAL FUSION/FORAMINOTOMY N/A 06/17/2014   Procedure: Thoracic five laminectomy for epidural tumor resection Thoracic 4-7 posterior lateral arthrodesis, segmental pedicle screw fixation.;  Surgeon: KyAshok PallMD;  Location: [facility];  Service: Neurosurgery;  Laterality: N/A;    There were no vitals filed for this visit.   Subjective Assessment - 05/30/20 1042    Subjective Pt somnolent during session today in discussion whether or not he wants to do HEP for dysphagia.    Patient is accompained by: Family member   wife   Currently in Pain? No/denies                 ADULT SLP TREATMENT - 05/30/20 1042      General Information   Behavior/Cognition  Cooperative;Lethargic;Hard of hearing      Treatment Provided   Treatment provided Dysphagia      Dysphagia Treatment   Temperature Spikes Noted No    Other treatment/comments Pt with hydrophonia during session  - req'd SLP cues for awareness 100% of the time. SLP guided pt through strong thrat clear and reswallow with each instance (x3 today). AFter explanation of infomration under "pt education" pt decided he wanted to do HEP. SLP demonstrated each exercise and pt return demonstrated. SLP ensured wife was comfortable with teaching to pt/cueing pt at home for HEP. SLP suggested pt check off exercises as he does them to engage him. SLP ensured pt was alert enough during sesison in order to perfrom each exercise correctly.       Assessment / Recommendations / Plan   Plan Continue with current plan of care   when wife cues pt correctly x2 sessions, decr to x1/week     Progression Toward Goals   Progression toward goals Progressing toward goals            SLP Education - 05/30/20 1112    Education Details aspiration PNA, silent aspiration, HEP procedure, rationale for HEP and how it may benefit pt, decr to x1/week when wife cues pt correctly x2 sessions    Person(s) Educated  Patient;Spouse    Methods Explanation;Demonstration;Verbal cues;Handout    Comprehension Verbalized understanding;Returned demonstration;Verbal cues required;Need further instruction            SLP Short Term Goals - 05/30/20 1125      SLP SHORT TERM GOAL #1   Title pt will demo HEP for swallowing with occasional min A from SLP or family x 2 sessions    Time 4    Period Weeks    Status New            SLP Long Term Goals - 05/30/20 1124      SLP LONG TERM GOAL #1   Title pt will adequately follow swallow precautions with POs with min A from SLP    Time 8    Period Weeks   or 17 sessions, for all LTGs   Status On-going      SLP LONG TERM GOAL #2   Title pt will demo HEP for swallowing with rare min A  from SLP or family x 2 sessions    Time 8    Period Weeks    Status Revised      SLP LONG TERM GOAL #3   Title pt will adequately follow swallow precautions with POs with min A from wife in 2 sessions    Time 8    Period Weeks    Status On-going            Plan - 05/30/20 1118    Clinical Impression Statement [name] is known to this SLP from previous course of therapy for dysphagia, aphasia and/or verbal apraxia. Today, he is choosing to complete dysphagia HEP for deficit muscle groups. Pt "wet" voice cont'd today without pt awareness. Pt will benefit from skilled ST to assess accuracy of HEP, and his safety with POs, and to cont to educate wife on cueing strategies for pt to complete HEP, and use aspiration precautions.    Speech Therapy Frequency 2x / week    Duration 8 weeks   or 17 sessoins   Treatment/Interventions Aspiration precaution training;Pharyngeal strengthening exercises;Diet toleration management by SLP;Trials of upgraded texture/liquids;Cueing hierarchy;SLP instruction and feedback;Compensatory strategies;Internal/external aids;Patient/family education    Potential to Achieve Goals Fair    Potential Considerations Previous level of function;Cooperation/participation level;Severity of impairments    Consulted and Agree with Plan of Care Patient           Patient will benefit from skilled therapeutic intervention in order to improve the following deficits and impairments:   Oropharyngeal dysphagia    Problem List Patient Active Problem List   Diagnosis Date Noted   Multiple myeloma not having achieved remission ([place]) 10/24/2015   Dysphagia as late effect of stroke 07/10/2015   Diarrhea 04/25/2015   Cancer associated pain 04/25/2015   Long term current use of anticoagulant therapy 04/25/2015   Basal ganglia infarction ([place]) 02/12/2015   Dizziness    Expressive aphasia    Past pointing    Right leg weakness    History of stroke  02/11/2015   CVA (cerebral infarction) 02/11/2015   Aspiration pneumonia ([place]) 02/11/2015   Aphasia    Difficulty speaking    Speech difficult to understand    Encounter for antineoplastic chemotherapy 01/31/2015   Constipation 12/20/2014   Rash 12/20/2014   Peripheral edema 12/06/2014   Multifactorial gait disorder 09/05/2014   Multiple myeloma ([place])    Paraplegia ([place]) 06/21/2014   Postoperative anemia due to acute blood loss 06/21/2014   Neoplasm of  thoracic spine 06/20/2014   Spine metastasis ([place]) 06/17/2014   Thoracic spine tumor 06/17/2014   Metastatic bone cancer ([place]) 06/15/2014    [place] ,[place], [place]  05/30/2020, 11:26 AM  [facility] [address] [place], [place], [address] Phone: [phone]   Fax:  [phone]   Name: Jeremiah Owens MRN: [id number] Date of Birth: [birth year]

## 2020-05-31 ENCOUNTER — Telehealth: Payer: Self-pay

## 2020-05-31 ENCOUNTER — Other Ambulatory Visit (HOSPITAL_COMMUNITY): Payer: Self-pay | Admitting: Internal Medicine

## 2020-05-31 ENCOUNTER — Other Ambulatory Visit: Payer: Self-pay | Admitting: Internal Medicine

## 2020-05-31 DIAGNOSIS — M7989 Other specified soft tissue disorders: Secondary | ICD-10-CM | POA: Diagnosis not present

## 2020-05-31 NOTE — Telephone Encounter (Signed)
Dr. Lysle Rubens,   Mr. Gahm is currently being seen by Physical Therapy. Patient is currently having some edema in the RLE along with increased redness in comparison to LLE. No reports of pain/tenderness along calf area. Patient reports edema is not improved with elevation. I am concerned of potential cellulitis or Infection. I educated patient and wife to reach out to your office to schedule appointment to be further assessed.   Thank you, Guillermina City, PT, [facility] [address] [place], [place]  [address] Phone:  [phone] Fax:  [phone]

## 2020-06-01 ENCOUNTER — Ambulatory Visit: Payer: Medicare Other

## 2020-06-01 DIAGNOSIS — M7989 Other specified soft tissue disorders: Secondary | ICD-10-CM | POA: Diagnosis not present

## 2020-06-04 ENCOUNTER — Telehealth: Payer: Self-pay

## 2020-06-04 ENCOUNTER — Ambulatory Visit: Payer: Medicare Other | Attending: Physical Medicine & Rehabilitation

## 2020-06-04 ENCOUNTER — Other Ambulatory Visit: Payer: Self-pay

## 2020-06-04 DIAGNOSIS — R2689 Other abnormalities of gait and mobility: Secondary | ICD-10-CM | POA: Insufficient documentation

## 2020-06-04 DIAGNOSIS — R1312 Dysphagia, oropharyngeal phase: Secondary | ICD-10-CM | POA: Diagnosis not present

## 2020-06-04 DIAGNOSIS — M6281 Muscle weakness (generalized): Secondary | ICD-10-CM | POA: Diagnosis not present

## 2020-06-04 DIAGNOSIS — R296 Repeated falls: Secondary | ICD-10-CM

## 2020-06-04 DIAGNOSIS — R293 Abnormal posture: Secondary | ICD-10-CM | POA: Insufficient documentation

## 2020-06-04 DIAGNOSIS — R2681 Unsteadiness on feet: Secondary | ICD-10-CM | POA: Insufficient documentation

## 2020-06-04 NOTE — Therapy (Signed)
[facility] [address] [place], [place], [address] Phone: [phone]   Fax:  [phone]  Physical Therapy Treatment  Patient Details  Name: Jeremiah Owens MRN: [id number] Date of Birth: 12-04-1929 Referring Provider (PT): [name], MD   Encounter Date: 06/04/2020   PT End of Session - 06/04/20 1112    Visit Number 8    Number of Visits 17    Date for PT Re-Evaluation 08/05/20   POC 8 weeks, cert 90 days   Authorization Type Medicare    Progress Note Due on Visit 10    PT Start Time 1101    PT Stop Time 1144    PT Time Calculation (min) 43 min    Equipment Utilized During Treatment Gait belt    Activity Tolerance Patient tolerated treatment well    Behavior During Therapy WFL for tasks assessed/performed           Past Medical History:  Diagnosis Date  . Allergy   . Anxiety   . Bone cancer ([place])    t_spine T-5  . Compression fracture   . Encounter for antineoplastic chemotherapy 01/31/2015  . Hearing loss   . Melanoma ([place])   . Multiple myeloma ([place])   . Prostate cancer ([place]) 2002  . S/P radiation therapy 08/21/14-09/04/14   T4-6 25Gy/78f  . S/P radiation therapy 11/29/14-12/12/14   rt prox humerus/shoulder/scapula 25Gy/124f . Skin cancer 1994   melanoma  right neck   . St[place] ([facility]  . Thyroid disease     Past Surgical History:  Procedure Laterality Date  . HERNIA REPAIR    . POSTERIOR CERVICAL FUSION/FORAMINOTOMY N/A 06/17/2014   Procedure: Thoracic five laminectomy for epidural tumor resection Thoracic 4-7 posterior lateral arthrodesis, segmental pedicle screw fixation.;  Surgeon: KyAshok PallMD;  [facility];  Service: Neurosurgery;  Laterality: N/A;    There were no vitals filed for this visit.   Subjective Assessment - 06/04/20 1109    Subjective Patient reports went to MD and had ultrasound, No DVT. Believes it is infection of some sort, follow up appt is scheduled with  Dr. HuLysle Rubens Wife reports he is currently on an antibiotic. No falls to report.    Pertinent History Anxiety, Multiple Myeloma with Thoracic Spinal METS, Stroke (L Basal Ganglia Infarct with R Hemiparesis, Thyroid Disease, Adhesive Capsulitis (L Shoulder), Dysphagia (d/t CVA    Patient Stated Goals get well; get balance back    Currently in Pain? No/denies                     [facility] Treatment/Exercise - 06/04/20 1118      Transfers   Transfers Sit to Stand;Stand to Sit    Sit to Stand 5: Supervision;4: Min guard    Sit to Stand Details Verbal cues for technique;Manual facilitation for weight shifting    Stand to Sit 5: Supervision;4: Min guard    Stand to Sit Details (indicate cue type and reason) Verbal cues for technique    Comments with sit <> stand, PT continues to provide verbal cues for hand placement and forward trunk lean      Ambulation/Gait   Ambulation/Gait Yes    Ambulation/Gait Assistance 4: Min guard    Ambulation/Gait Assistance Details completed ambulation with rollator x 115 ft. verbal cues for step length.     Ambulation Distance (Feet) 115 Feet    Assistive device Rollator    Gait Pattern Step-through pattern;Decreased  step length - right;Decreased step length - left;Decreased dorsiflexion - right;Decreased dorsiflexion - left;Right flexed knee in stance;Left flexed knee in stance;Trunk flexed    Ambulation Surface Level;Indoor      Self-Care   Self-Care Other Self-Care Comments    Other Self-Care Comments  Patient ultrasound negative for DVT, MD suspects infection. PT educating patient on conitnued wearing of compression stockings as well as elevating the legs. Patient reports currently elevating at bedtime when sleeping. PT educating on elevating 1-2 times a day to promote improvements in swelling.       Neuro Re-ed    Neuro Re-ed Details  Standing in // bars with UE support x 2, completed x 10 alternating toe taps to 4" steps. Progressed to  completed x 10 reps with single UE support, and then progressed to no UE suppot x 10 reps. One instance of imbalance requiring CGA from PT.                Balance Exercises - 06/04/20 0001      Balance Exercises: Standing   Standing Eyes Opened Narrow base of support (BOS);Wide (BOA);Foam/compliant surface;Limitations    Standing Eyes Opened Limitations completed initial standing with wide BOS and eyes open without UE support 2 x 1 minute each, progressed to completing with narrow BOS 3 x 30 seconds each. increased challenge with narrrow BOS.     Standing Eyes Closed Wide (BOA);Foam/compliant surface;3 reps;30 secs;Limitations    Standing Eyes Closed Limitations with wide BOS compelted standing without UE support with vision removed 3 x 30 seconds each, increased sway noted with EC    Stepping Strategy Anterior;Lateral;UE support;Limitations    Stepping Strategy Limitations completed AP stepping strategy over black balance beam, completed x 15 reps alternating with BUE support. decreased clerarnce noted on RLE. Progrssed to completed lateral stepping over black balance beam x 10 reps with BLE. Increased challenge with clearance noted with RLE. CGA and BUE support required for completion.              PT Education - 06/04/20 1229    Education Details Educated on elevation of RLE (see self care for details)    Person(s) Educated Patient    Methods Explanation    Comprehension Verbalized understanding            PT Short Term Goals - 05/07/20 1650      PT SHORT TERM GOAL #1   Title Patient will be independent with initial HEP for strengthening/balance/posture (All STGs Due: 06/04/2020)    Baseline no HEP established    Time 4    Period Weeks    Status New    Target Date 06/04/20      PT SHORT TERM GOAL #2   Title Patient will improve TUG to </= 20 seconds to demonstrate improved mobility and reduced fall risk    Baseline 24.75 secs    Time 4    Period Weeks    Status New       PT SHORT TERM GOAL #3   Title Patient wil improve Berg Balance to >/= 37/56 to demonstrate reduced risk for falls and improved balance.    Baseline 32/56    Time 4    Period Weeks    Status New      PT SHORT TERM GOAL #4   Title Patient will demo ability to ambulate >400 ft with LRAD and supervision level for improved community mobility    Baseline 113f with Rollator    Time  4    Period Weeks    Status New             PT Long Term Goals - 05/07/20 1653      PT LONG TERM GOAL #1   Title Patient will report independence with final HEP for strengthening/balance/posture (ALL LTGs due: 07/02/20)    Baseline no HEP established    Time 8    Period Weeks    Status New    Target Date 07/02/20      PT LONG TERM GOAL #2   Title Patient will demo ability to ambulate > 300 ft over outdoor paved surfaces with LRAD and superivison for improved community mobility    Baseline outdoor ambulation not assessed to date    Time 8    Period Weeks    Status New      PT LONG TERM GOAL #3   Title Pt will perform TUG test in </= 15 sconds to indicate decreased risk of falling    Baseline 24.75 secs    Time 8    Period Weeks    Status New      PT LONG TERM GOAL #4   Title Patient will improve Berg Balance to >/= 45/56 to demonstrate improved functional standing balance and reduced fall risk.    Baseline 32/56    Time 8    Period Weeks    Status New      PT LONG TERM GOAL #5   Title Patient will improve gait speed to >/= 2.5 ft/sec to demonstrate improved community ambulation    Baseline 1.67 ft/sec    Time 8    Period Weeks    Status New                 Plan - 06/04/20 1230    Clinical Impression Statement Patient ultrasound was negative for DVT, MD suspects possible infection and will follow up this week. Today's skilled PT session included continued balance exercises working to promote balance without UE support and improved step length. Patient continue to require  intermittent rest breaks throughout session due to fatigue. Will continue to progress toward all LTGs.    Personal Factors and Comorbidities Comorbidity 3+;Time since onset of injury/illness/exacerbation;Age    Comorbidities Anxiety, Multiple Myeloma with Thoracic Spinal METS, Stroke (L Basal Ganglia Infarct with R Hemiparesis, Thyroid Disease, Adhesive Capsulitis (L Shoulder), Dysphagia (d/t CVA)    Examination-Activity Limitations Transfers;Stairs;Stand;Locomotion Level;Sit;Bend    Examination-Participation Restrictions Community Activity;Yard Work    [employer]chant [employer] officer Evolving/Moderate complexity    Rehab Potential Good    PT Frequency 2x / week    PT Duration 8 weeks    PT Treatment/Interventions ADLs/Self Care Home Management;Moist Heat;Cryotherapy;Therapeutic activities;DME Instruction;Gait training;Stair training;Functional mobility training;Therapeutic exercise;Balance training;Patient/family education;Orthotic Fit/Training;Neuromuscular re-education;Manual techniques;Passive range of motion;Vestibular    PT Next Visit Plan Check STG + review HEP. Continue to progress HEP with seated/standing strengthening. Incorporate more low-level balance exercises. Gait and transfer training.    PT Home Exercise Plan PT to incorporate supine hip strengthening exercises to HEP.    Consulted and Agree with Plan of Care Patient           Patient will benefit from skilled therapeutic intervention in order to improve the following deficits and impairments:  Abnormal gait, Decreased endurance, Decreased balance, Decreased mobility, Difficulty walking, Postural dysfunction, Pain, Decreased strength, Decreased knowledge of use of DME, Decreased activity tolerance, Decreased range of motion  Visit Diagnosis: Repeated falls  Other abnormalities  of gait and mobility  Muscle weakness (generalized)  Unsteadiness on feet  Abnormal posture     Problem List Patient Active Problem List     Diagnosis Date Noted  . Multiple myeloma not having achieved remission ([place]) 10/24/2015  . Dysphagia as late effect of stroke 07/10/2015  . Diarrhea 04/25/2015  . Cancer associated pain 04/25/2015  . Long term current use of anticoagulant therapy 04/25/2015  . Basal ganglia infarction ([place]) 02/12/2015  . Dizziness   . Expressive aphasia   . Past pointing   . Right leg weakness   . History of stroke 02/11/2015  . CVA (cerebral infarction) 02/11/2015  . Aspiration pneumonia ([place]) 02/11/2015  . Aphasia   . Difficulty speaking   . Speech difficult to understand   . Encounter for antineoplastic chemotherapy 01/31/2015  . Constipation 12/20/2014  . Rash 12/20/2014  . Peripheral edema 12/06/2014  . Multifactorial gait disorder 09/05/2014  . Multiple myeloma ([place])   . Paraplegia ([place]) 06/21/2014  . Postoperative anemia due to acute blood loss 06/21/2014  . 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 Spine metastasis ([place]) 06/17/2014  . Thoracic spine tumor 06/17/2014  . Metastatic bone cancer ([place]) 06/15/2014    Jones Bales, PT, DPT 06/04/2020, 12:34 PM  [facility] [address] [place], [place], [address] Phone: [phone]   Fax:  [phone]  Name: Jeremiah Owens MRN: [id number] Date of Birth: May 22, 1930

## 2020-06-04 NOTE — Telephone Encounter (Signed)
Dr. Lysle Rubens,    I am scheduled to see [name] for Physical Therapy today. I am aware that he had an Ultrasound completed for potential DVT but I am unable to see results/findings. Is Mr. Wenzl cleared for participation in PT services at this time?    Thank you for any guidance,  Guillermina [place], PT, DPT

## 2020-06-05 ENCOUNTER — Ambulatory Visit: Payer: Medicare Other

## 2020-06-05 DIAGNOSIS — R296 Repeated falls: Secondary | ICD-10-CM | POA: Diagnosis not present

## 2020-06-05 DIAGNOSIS — M6281 Muscle weakness (generalized): Secondary | ICD-10-CM | POA: Diagnosis not present

## 2020-06-05 DIAGNOSIS — R2689 Other abnormalities of gait and mobility: Secondary | ICD-10-CM | POA: Diagnosis not present

## 2020-06-05 DIAGNOSIS — R293 Abnormal posture: Secondary | ICD-10-CM | POA: Diagnosis not present

## 2020-06-05 DIAGNOSIS — R1312 Dysphagia, oropharyngeal phase: Secondary | ICD-10-CM | POA: Diagnosis not present

## 2020-06-05 DIAGNOSIS — R2681 Unsteadiness on feet: Secondary | ICD-10-CM | POA: Diagnosis not present

## 2020-06-05 NOTE — Therapy (Signed)
[facility] [address] [place], [place], [address] Phone: [phone]   Fax:  [phone]  Speech Language Pathology Treatment  Patient Details  Name: Jeremiah Owens MRN: [id number] Date of Birth: [birth year] Referring Provider (SLP): [name], MD   Encounter Date: 06/05/2020   End of Session - 06/05/20 1017    Visit Number 3    Number of Visits 17    Date for SLP Re-Evaluation 08/17/20   90 days   SLP Start Time 0935    SLP Stop Time  1016    SLP Time Calculation (min) 41 min    Activity Tolerance Patient tolerated treatment well           Past Medical History:  Diagnosis Date  . Allergy   . Anxiety   . Bone cancer ([place])    t_spine T-5  . Compression fracture   . Encounter for antineoplastic chemotherapy 01/31/2015  . Hearing loss   . Melanoma ([place])   . Multiple myeloma ([place])   . Prostate cancer ([place]) 2002  . S/P radiation therapy 08/21/14-09/04/14   T4-6 25Gy/50f  . S/P radiation therapy 11/29/14-12/12/14   rt prox humerus/shoulder/scapula 25Gy/163f . Skin cancer 1994   melanoma  right neck   . Stroke ([facility]  . Thyroid disease     Past Surgical History:  Procedure Laterality Date  . HERNIA REPAIR    . POSTERIOR CERVICAL FUSION/FORAMINOTOMY N/A 06/17/2014   Procedure: Thoracic five laminectomy for epidural tumor resection Thoracic 4-7 posterior lateral arthrodesis, segmental pedicle screw fixation.;  Surgeon: KyAshok PallMD;  [facility];  Service: Neurosurgery;  Laterality: N/A;    There were no vitals filed for this visit.   Subjective Assessment - 06/05/20 0942    Subjective "Well, some of (the exercises) are harder for me to do."    Patient is accompained by: Family member   wife   Currently in Pain? No/denies                 ADULT SLP TREATMENT - 06/05/20 0943      General Information   Behavior/Cognition Alert;Hard of hearing;Cooperative      Treatment  Provided   Treatment provided Dysphagia      Dysphagia Treatment   Other treatment/comments SLP reviewed exercises pt/wife state are difficult - Masako and Mendelsohn. Pt req'd modification fo rmasako to touch tongue to back of front top teeth instead of tongue bit. SLP provided  mod cues usually for wife to simplify her cues. She needed occasional min-mod cues by session end. SLP provided direct instruction for [facility] wife and pt and showed a video of WNL swallow to enable pt to see anatomy - this appeared to be helpful as it improved pt's success with mendelsohn afterwards.       Assessment / Recommendations / Plan   Plan Continue with current plan of care            SLP Education - 06/05/20 1016    Education Details mendelsohn and masako procedure, cueing wife to cue pt succinctly and successfully    Person(s) Educated Patient    Methods Explanation;Demonstration;Verbal cues    Comprehension Verbalized understanding;Returned demonstration;Verbal cues required;Need further instruction            SLP Short Term Goals - 06/05/20 1312      SLP SHORT TERM GOAL #1   Title pt will demo HEP for swallowing with occasional min A from SLP  or family x 2 sessions    Time 3    Period Weeks    Status On-going            SLP Long Term Goals - 06/05/20 1313      SLP LONG TERM GOAL #1   Title pt will adequately follow swallow precautions with POs with min A from SLP    Time 7    Period Weeks   or 17 sessions, for all LTGs   Status On-going      SLP LONG TERM GOAL #2   Title pt will demo HEP for swallowing with rare min A from SLP or family x 2 sessions    Time 7    Period Weeks    Status On-going      SLP LONG TERM GOAL #3   Title pt will adequately follow swallow precautions with POs with min A from wife in 2 sessions    Time 7    Period Weeks    Status On-going            Plan - 06/05/20 1017    Clinical Impression Statement [name] is curently  completing dysphagia HEP for deficit muscle groups with usual mod assistance from his wife. Pt "wet" voice cont'd today without pt awareness. Pt will benefit from skilled ST to assess accuracy of HEP, and his safety with POs, and to cont to educate wife on cueing strategies for pt to complete HEP, and use aspiration precautions.    Speech Therapy Frequency 2x / week    Duration 8 weeks   or 17 sessoins   Treatment/Interventions Aspiration precaution training;Pharyngeal strengthening exercises;Diet toleration management by SLP;Trials of upgraded texture/liquids;Cueing hierarchy;SLP instruction and feedback;Compensatory strategies;Internal/external aids;Patient/family education    Potential to Achieve Goals Fair    Potential Considerations Previous level of function;Cooperation/participation level;Severity of impairments    Consulted and Agree with Plan of Care Patient           Patient will benefit from skilled therapeutic intervention in order to improve the following deficits and impairments:   Oropharyngeal dysphagia    Problem List Patient Active Problem List   Diagnosis Date Noted  . Multiple myeloma not having achieved remission ([place]) 10/24/2015  . Dysphagia as late effect of stroke 07/10/2015  . Diarrhea 04/25/2015  . Cancer associated pain 04/25/2015  . Long term current use of anticoagulant therapy 04/25/2015  . Basal ganglia infarction ([place]) 02/12/2015  . Dizziness   . Expressive aphasia   . Past pointing   . Right leg weakness   . History of stroke 02/11/2015  . CVA (cerebral infarction) 02/11/2015  . Aspiration pneumonia ([place]) 02/11/2015  . Aphasia   . Difficulty speaking   . Speech difficult to understand   . Encounter for antineoplastic chemotherapy 01/31/2015  . Constipation 12/20/2014  . Rash 12/20/2014  . Peripheral edema 12/06/2014  . Multifactorial gait disorder 09/05/2014  . Multiple myeloma ([place])   . Paraplegia ([place]) 06/21/2014  . Postoperative anemia  due to acute blood loss 06/21/2014  . 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 Spine metastasis ([place]) 06/17/2014  . Thoracic spine tumor 06/17/2014  . Metastatic bone cancer ([place]) 06/15/2014    Enedina Pair ,[place], [place]  06/05/2020, 1:14 PM  [facility] [address] [place], [place], [address] Phone: [phone]   Fax:  [phone]   Name: Jeremiah Owens MRN: [id number] Date of Birth: April 13, 1930

## 2020-06-06 ENCOUNTER — Ambulatory Visit: Payer: Medicare Other

## 2020-06-06 ENCOUNTER — Other Ambulatory Visit: Payer: Self-pay

## 2020-06-06 DIAGNOSIS — M6281 Muscle weakness (generalized): Secondary | ICD-10-CM | POA: Diagnosis not present

## 2020-06-06 DIAGNOSIS — R1312 Dysphagia, oropharyngeal phase: Secondary | ICD-10-CM

## 2020-06-06 DIAGNOSIS — R293 Abnormal posture: Secondary | ICD-10-CM | POA: Diagnosis not present

## 2020-06-06 DIAGNOSIS — R2689 Other abnormalities of gait and mobility: Secondary | ICD-10-CM | POA: Diagnosis not present

## 2020-06-06 DIAGNOSIS — L0390 Cellulitis, unspecified: Secondary | ICD-10-CM | POA: Diagnosis not present

## 2020-06-06 DIAGNOSIS — R2681 Unsteadiness on feet: Secondary | ICD-10-CM

## 2020-06-06 DIAGNOSIS — R296 Repeated falls: Secondary | ICD-10-CM | POA: Diagnosis not present

## 2020-06-06 DIAGNOSIS — R600 Localized edema: Secondary | ICD-10-CM | POA: Diagnosis not present

## 2020-06-06 NOTE — Therapy (Signed)
[facility] [address] [place], [place], [address] Phone: [phone]   Fax:  [phone]  Speech Language Pathology Treatment  Patient Details  Name: Jeremiah Owens MRN: [id number] Date of Birth: 10-25-1929 Referring Provider (SLP): [name], MD   Encounter Date: 06/06/2020   End of Session - 06/06/20 1732    Visit Number 4    Number of Visits 17    Date for SLP Re-Evaluation 08/17/20   90 days   SLP Start Time 62    SLP Stop Time  1100    SLP Time Calculation (min) 40 min    Activity Tolerance Patient tolerated treatment well           Past Medical History:  Diagnosis Date   Allergy    Anxiety    Bone cancer ([place])    t_spine T-5   Compression fracture    Encounter for antineoplastic chemotherapy 01/31/2015   Hearing loss    Melanoma ([place])    Multiple myeloma ([place])    Prostate cancer ([place]) 2002   S/P radiation therapy 08/21/14-09/04/14   T4-6 25Gy/54f   S/P radiation therapy 11/29/14-12/12/14   rt prox humerus/shoulder/scapula 25Gy/133f  Skin cancer 1994   melanoma  right neck    [facility]   Thyroid disease     Past Surgical History:  Procedure Laterality Date   HERNIA REPAIR     POSTERIOR CERVICAL FUSION/FORAMINOTOMY N/A 06/17/2014   Procedure: Thoracic five laminectomy for epidural tumor resection Thoracic 4-7 posterior lateral arthrodesis, segmental pedicle screw fixation.;  Surgeon: KyAshok PallMD;  [facility];  Service: Neurosurgery;  Laterality: N/A;    There were no vitals filed for this visit.   Subjective Assessment - 06/06/20 1024    Subjective Pt and wife arrive to clinic today - pt with hydrophonic voice and unaware.    Patient is accompained by: Family member   wife                ADULT SLP TREATMENT - 06/06/20 1152      General Information   Behavior/Cognition Alert;Hard of hearing;Cooperative      Treatment Provided    Treatment provided Dysphagia      Dysphagia Treatment   Temperature Spikes Noted No    Other treatment/comments SLP provided min cues to wife for nature of cueing pt, initially at occasional level, and faded to rare/SBA. Pt with fatigue after 4 cycles of exercises - SLP cued wife that this would be a good place to stop if she were at home , and then to pick up again after a 15 mintue break. SLP with min cues to wife for assisting pt with MeCaryl Ada SLP used tactile cues for pt to make this exericse more concrete - pt demonstrated understanding upon completion of today's session.  SLP educated pt (again) and wife on mechanics of hydrophonia using oral and pharyngeal model.       Assessment / Recommendations / Plan   Plan Continue with current plan of care      Progression Toward Goals   Progression toward goals Progressing toward goals            SLP Education - 06/06/20 1732    Education Details see note from today            SLP Short Term Goals - 06/06/20 1733      SLP SHORT TERM GOAL #1   Title pt will demo HEP  for swallowing with occasional min A from SLP or family x 2 sessions    Time 3    Period Weeks    Status On-going            SLP Long Term Goals - 06/06/20 1733      SLP LONG TERM GOAL #1   Title pt will adequately follow swallow precautions with POs with min A from SLP    Time 7    Period Weeks   or 17 sessions, for all LTGs   Status On-going      SLP LONG TERM GOAL #2   Title pt will demo HEP for swallowing with rare min A from SLP or family x 2 sessions    Time 7    Period Weeks    Status On-going      SLP LONG TERM GOAL #3   Title pt will adequately follow swallow precautions with POs with min A from wife in 2 sessions    Time 7    Period Weeks    Status On-going            Plan - 06/06/20 1733    Clinical Impression Statement [name] is curently completing dysphagia HEP for deficit muscle groups with usual mod assistance from his  wife. Pt "wet" voice cont'd today without pt awareness. See "other comments" for more information. Pt will benefit from skilled ST to assess accuracy of HEP, and his safety with POs, and to cont to educate wife on cueing strategies for pt to complete HEP, and use aspiration precautions.    Speech Therapy Frequency 2x / week    Duration 8 weeks   or 17 sessoins   Treatment/Interventions Aspiration precaution training;Pharyngeal strengthening exercises;Diet toleration management by SLP;Trials of upgraded texture/liquids;Cueing hierarchy;SLP instruction and feedback;Compensatory strategies;Internal/external aids;Patient/family education    Potential to Achieve Goals Fair    Potential Considerations Previous level of function;Cooperation/participation level;Severity of impairments    Consulted and Agree with Plan of Care Patient           Patient will benefit from skilled therapeutic intervention in order to improve the following deficits and impairments:   Oropharyngeal dysphagia    Problem List Patient Active Problem List   Diagnosis Date Noted   Multiple myeloma not having achieved remission ([place]) 10/24/2015   Dysphagia as late effect of stroke 07/10/2015   Diarrhea 04/25/2015   Cancer associated pain 04/25/2015   Long term current use of anticoagulant therapy 04/25/2015   Basal ganglia infarction ([place]) 02/12/2015   Dizziness    Expressive aphasia    Past pointing    Right leg weakness    History of stroke 02/11/2015   CVA (cerebral infarction) 02/11/2015   Aspiration pneumonia ([place]) 02/11/2015   Aphasia    Difficulty speaking    Speech difficult to understand    Encounter for antineoplastic chemotherapy 01/31/2015   Constipation 12/20/2014   Rash 12/20/2014   Peripheral edema 12/06/2014   Multifactorial gait disorder 09/05/2014   Multiple myeloma ([place])    Paraplegia ([place]) 06/21/2014   Postoperative anemia due to acute blood loss 06/21/2014    Neoplasm of thoracic spine 06/20/2014   Spine metastasis ([place]) 06/17/2014   Thoracic spine tumor 06/17/2014   Metastatic bone cancer ([place]) 06/15/2014    Jessie ,[place], CCC-SLP  06/06/2020, 5:34 PM  [facility] [address] [place], [place], [address] Phone: [phone]   Fax:  [phone]   Name: Jeremiah Owens  MRN: [id number] Date of Birth: 07-21-1930

## 2020-06-06 NOTE — Therapy (Signed)
[facility] [address] [place], [place], [address] Phone: [phone]   Fax:  [phone]  Physical Therapy Treatment/Progress Note  Patient Details  Name: Jeremiah Owens MRN: [id number] Date of Birth: 1930/04/16 Referring Provider (PT): [name], MD  Physical Therapy Progress Note   Dates of Reporting Period: 05/07/20 - 06/06/20  See Note below for Objective Data and Assessment of Progress/Goals.  Thank you for the referral of this patient. [place], PT, DPT  Encounter Date: 06/06/2020   PT End of Session - 06/06/20 1101    Visit Number 9   progress note completed on 9th visit   Number of Visits 17    Date for PT Re-Evaluation 08/05/20   POC 8 weeks, cert 90 days   Authorization Type Medicare    Progress Note Due on Visit 10    PT Start Time 1100    PT Stop Time 1143    PT Time Calculation (min) 43 min    Equipment Utilized During Treatment Gait belt    Activity Tolerance Patient tolerated treatment well    Behavior During Therapy WFL for tasks assessed/performed           Past Medical History:  Diagnosis Date   Allergy    Anxiety    Bone cancer ([place])    t_spine T-5   Compression fracture    Encounter for antineoplastic chemotherapy 01/31/2015   Hearing loss    Melanoma ([place])    Multiple myeloma ([place])    Prostate cancer ([place]) 2002   S/P radiation therapy 08/21/14-09/04/14   T4-6 25Gy/21f   S/P radiation therapy 11/29/14-12/12/14   rt prox humerus/shoulder/scapula 25Gy/141f  Skin cancer 1994   melanoma  right neck    Stroke ([facility]   Thyroid disease     Past Surgical History:  Procedure Laterality Date   HERNIA REPAIR     POSTERIOR CERVICAL FUSION/FORAMINOTOMY N/A 06/17/2014   Procedure: Thoracic five laminectomy for epidural tumor resection Thoracic 4-7 posterior lateral arthrodesis, segmental pedicle screw fixation.;  Surgeon: KyAshok PallMD;  [facility];   Service: Neurosurgery;  Laterality: N/A;    There were no vitals filed for this visit.   Subjective Assessment - 06/06/20 1104    Subjective Patient reports monday evening when he turned quickly and lost his balance because his feet got tangled. Did not have his Rollator with him. No injuries, just bruising on the L arm. Patient/wife reports he scooted over to grab onto desk and with wife assistance was able to stand up.    Pertinent History Anxiety, Multiple Myeloma with Thoracic Spinal METS, Stroke (L Basal Ganglia Infarct with R Hemiparesis, Thyroid Disease, Adhesive Capsulitis (L Shoulder), Dysphagia (d/t CVA    Patient Stated Goals get well; get balance back    Currently in Pain? Yes    Pain Score 0-No pain    Pain Location Shoulder    Pain Orientation Left    Pain Descriptors / Indicators Aching                             [facility] Adult PT Treatment/Exercise - 06/06/20 1112      Transfers   Transfers Sit to Stand;Stand to Sit    Sit to Stand 5: Supervision    Sit to Stand Details Verbal cues for technique;Manual facilitation for weight shifting    Stand to Sit 5: Supervision    Stand to Sit  Details (indicate cue type and reason) Verbal cues for technique      Ambulation/Gait   Ambulation/Gait Yes    Ambulation/Gait Assistance 5: Supervision    Ambulation/Gait Assistance Details completed ambulation with rollator on indoor surfaces x 415 ft. HR:91-92, sp02;100% after completion. SOB rated: 8/10 after completion. patient reports mild pain in R ankle but resolved during ambulation    Ambulation Distance (Feet) 415 Feet    Assistive device Rollator    Gait Pattern Step-through pattern;Decreased step length - right;Decreased step length - left;Decreased dorsiflexion - right;Decreased dorsiflexion - left;Right flexed knee in stance;Left flexed knee in stance;Trunk flexed    Ambulation Surface Level;Indoor      Standardized Balance Assessment   Standardized Balance  Assessment Timed Up and Go Test;Berg Balance Test      Berg Balance Test   Sit to Stand Able to stand  independently using hands    Standing Unsupported Able to stand 2 minutes with supervision    Sitting with Back Unsupported but Feet Supported on Floor or Stool Able to sit safely and securely 2 minutes    Stand to Sit Controls descent by using hands    Transfers Able to transfer with verbal cueing and /or supervision    Standing Unsupported with Eyes Closed Able to stand 10 seconds with supervision    Standing Ubsupported with Feet Together Able to place feet together independently and stand for 1 minute with supervision    From Standing, Reach Forward with Outstretched Arm Can reach forward >12 cm safely (5")    From Standing Position, Pick up Object from Floor Able to pick up shoe, needs supervision    From Standing Position, Turn to Look Behind Over each Shoulder Turn sideways only but maintains balance    Turn 360 Degrees Needs close supervision or verbal cueing    Standing Unsupported, Alternately Place Feet on Step/Stool Able to complete 4 steps without aid or supervision    Standing Unsupported, One Foot in Front Able to take small step independently and hold 30 seconds    Standing on One Leg Tries to lift leg/unable to hold 3 seconds but remains standing independently    Total Score 35      Timed Up and Go Test   TUG Normal TUG    Normal TUG (seconds) 21.9   w/ rollator     Self-Care   Self-Care Other Self-Care Comments    Other Self-Care Comments  PT educating on fall prevention and using rollator at all times to promote improved safety and reduced fall risk within the home.                   PT Education - 06/06/20 1210    Education Details Educated on progress toward STGs    Person(s) Educated Patient;Spouse    Methods Explanation    Comprehension Verbalized understanding            PT Short Term Goals - 06/06/20 1125      PT SHORT TERM GOAL #1   Title  Patient will be independent with initial HEP for strengthening/balance/posture (All STGs Due: 06/04/2020)    Baseline reports complaince/independence    Time 4    Period Weeks    Status Achieved    Target Date 06/04/20      PT SHORT TERM GOAL #2   Title Patient will improve TUG to </= 20 seconds to demonstrate improved mobility and reduced fall risk    Baseline 24.75 secs,  21.9 secs    Time 4    Period Weeks    Status On-going      PT SHORT TERM GOAL #3   Title Patient wil improve Berg Balance to >/= 37/56 to demonstrate reduced risk for falls and improved balance.    Baseline 32/56, 35/56    Time 4    Period Weeks    Status On-going      PT SHORT TERM GOAL #4   Title Patient will demo ability to ambulate >400 ft with LRAD and supervision level for improved community mobility    Baseline 415 ft supervisoin with rollator    Time 4    Period Weeks    Status Achieved             PT Long Term Goals - 05/07/20 1653      PT LONG TERM GOAL #1   Title Patient will report independence with final HEP for strengthening/balance/posture (ALL LTGs due: 07/02/20)    Baseline no HEP established    Time 8    Period Weeks    Status New    Target Date 07/02/20      PT LONG TERM GOAL #2   Title Patient will demo ability to ambulate > 300 ft over outdoor paved surfaces with LRAD and superivison for improved community mobility    Baseline outdoor ambulation not assessed to date    Time 8    Period Weeks    Status New      PT LONG TERM GOAL #3   Title Pt will perform TUG test in </= 15 sconds to indicate decreased risk of falling    Baseline 24.75 secs    Time 8    Period Weeks    Status New      PT LONG TERM GOAL #4   Title Patient will improve Berg Balance to >/= 45/56 to demonstrate improved functional standing balance and reduced fall risk.    Baseline 32/56    Time 8    Period Weeks    Status New      PT LONG TERM GOAL #5   Title Patient will improve gait speed to >/=  2.5 ft/sec to demonstrate improved community ambulation    Baseline 1.67 ft/sec    Time 8    Period Weeks    Status New                 Plan - 06/06/20 1213    Clinical Impression Statement Today's skilled PT session included assessment of patient's progress toward STGs. Patient able to meet STG #1 and 4. Patient able to make progress toward STG #2 and 3 with Berg Balance and TUG. Patient demonstrated improvements in balance with improvements of Berg Balance to 35/56, and TUG time of 21.9 secs. Patient is still at increased fall risk with these scores at this time. patient is making progress with PT services and will continue to benefit from skilled PT to further maximize functional mobility and reduce fall risk.    Personal Factors and Comorbidities Comorbidity 3+;Time since onset of injury/illness/exacerbation;Age    Comorbidities Anxiety, Multiple Myeloma with Thoracic Spinal METS, Stroke (L Basal Ganglia Infarct with R Hemiparesis, Thyroid Disease, Adhesive Capsulitis (L Shoulder), Dysphagia (d/t CVA)    Examination-Activity Limitations Transfers;Stairs;Stand;Locomotion Level;Sit;Bend    Examination-Participation Restrictions Community Activity;Yard Work    Stability/Clinical Decision Making Evolving/Moderate complexity    Rehab Potential Good    PT Frequency 2x / week    PT  Duration 8 weeks    PT Treatment/Interventions ADLs/Self Care Home Management;Moist Heat;Cryotherapy;Therapeutic activities;DME Instruction;Gait training;Stair training;Functional mobility training;Therapeutic exercise;Balance training;Patient/family education;Orthotic Fit/Training;Neuromuscular re-education;Manual techniques;Passive range of motion;Vestibular    PT Next Visit Plan review/update HEP. Continue to progress HEP with seated/standing strengthening. Incorporate more low-level balance exercises. Gait and transfer training.    PT Home Exercise Plan PT to incorporate supine hip strengthening exercises to  HEP.    Consulted and Agree with Plan of Care Patient           Patient will benefit from skilled therapeutic intervention in order to improve the following deficits and impairments:  Abnormal gait, Decreased endurance, Decreased balance, Decreased mobility, Difficulty walking, Postural dysfunction, Pain, Decreased strength, Decreased knowledge of use of DME, Decreased activity tolerance, Decreased range of motion  Visit Diagnosis: Abnormal posture  Repeated falls  Other abnormalities of gait and mobility  Muscle weakness (generalized)  Unsteadiness on feet     Problem List Patient Active Problem List   Diagnosis Date Noted   Multiple myeloma not having achieved remission ([place]) 10/24/2015   Dysphagia as late effect of stroke 07/10/2015   Diarrhea 04/25/2015   Cancer associated pain 04/25/2015   Long term current use of anticoagulant therapy 04/25/2015   Basal ganglia infarction ([place]) 02/12/2015   Dizziness    Expressive aphasia    Past pointing    Right leg weakness    History of stroke 02/11/2015   CVA (cerebral infarction) 02/11/2015   Aspiration pneumonia ([place]) 02/11/2015   Aphasia    Difficulty speaking    Speech difficult to understand    Encounter for antineoplastic chemotherapy 01/31/2015   Constipation 12/20/2014   Rash 12/20/2014   Peripheral edema 12/06/2014   Multifactorial gait disorder 09/05/2014   Multiple myeloma ([place])    Paraplegia ([place]) 06/21/2014   Postoperative anemia due to acute blood loss 06/21/2014   Neoplasm of thoracic spine 06/20/2014   Spine metastasis ([place]) 06/17/2014   Thoracic spine tumor 06/17/2014   Metastatic bone cancer ([place]) 06/15/2014    Jones Bales, PT, DPT 06/06/2020, 12:16 PM  [facility] [address] [place], [place], [address] Phone: [phone]   Fax:  [phone]  Name: Takeem Krotzer MRN: [id number] Date of Birth:  12-Dec-1929

## 2020-06-11 ENCOUNTER — Other Ambulatory Visit: Payer: Self-pay

## 2020-06-11 ENCOUNTER — Ambulatory Visit: Payer: Medicare Other

## 2020-06-11 DIAGNOSIS — R296 Repeated falls: Secondary | ICD-10-CM | POA: Diagnosis not present

## 2020-06-11 DIAGNOSIS — M6281 Muscle weakness (generalized): Secondary | ICD-10-CM | POA: Diagnosis not present

## 2020-06-11 DIAGNOSIS — R293 Abnormal posture: Secondary | ICD-10-CM | POA: Diagnosis not present

## 2020-06-11 DIAGNOSIS — R1312 Dysphagia, oropharyngeal phase: Secondary | ICD-10-CM

## 2020-06-11 DIAGNOSIS — R2689 Other abnormalities of gait and mobility: Secondary | ICD-10-CM | POA: Diagnosis not present

## 2020-06-11 DIAGNOSIS — R2681 Unsteadiness on feet: Secondary | ICD-10-CM | POA: Diagnosis not present

## 2020-06-11 NOTE — Patient Instructions (Signed)
Signs of Aspiration Pneumonia   . Chest pain/tightness . Fever (can be low grade) . Cough  o With foul-smelling phlegm (sputum) o With sputum containing pus or blood o With greenish sputum . Fatigue  . Shortness of breath  . Wheezing   **IF YOU HAVE THESE SIGNS, CONTACT YOUR DOCTOR OR GO TO THE EMERGENCY DEPARTMENT OR URGENT CARE AS SOON AS POSSIBLE**      

## 2020-06-11 NOTE — Therapy (Signed)
[facility] [address] [place], [place], [address] Phone: [phone]   Fax:  [phone]  Speech Language Pathology Treatment  Patient Details  Name: Jeremiah Owens MRN: [id number] Date of Birth: June 14, 1930 Referring Provider (SLP): [name], MD   Encounter Date: 06/11/2020   End of Session - 06/11/20 1521    Visit Number 5    Number of Visits 17    Date for SLP Re-Evaluation 08/17/20   90 days   SLP Start Time 7    SLP Stop Time  1055    SLP Time Calculation (min) 36 min    Activity Tolerance Patient tolerated treatment well           Past Medical History:  Diagnosis Date  . Allergy   . Anxiety   . Bone cancer ([place])    t_spine T-5  . Compression fracture   . Encounter for antineoplastic chemotherapy 01/31/2015  . Hearing loss   . Melanoma ([place])   . Multiple myeloma ([place])   . Prostate cancer ([place]) 2002  . S/P radiation therapy 08/21/14-09/04/14   T4-6 25Gy/31f  . S/P radiation therapy 11/29/14-12/12/14   rt prox humerus/shoulder/scapula 25Gy/168f . Skin cancer 1994   melanoma  right neck   . Stroke ([facility]  . Thyroid disease     Past Surgical History:  Procedure Laterality Date  . HERNIA REPAIR    . POSTERIOR CERVICAL FUSION/FORAMINOTOMY N/A 06/17/2014   Procedure: Thoracic five laminectomy for epidural tumor resection Thoracic 4-7 posterior lateral arthrodesis, segmental pedicle screw fixation.;  Surgeon: KyAshok PallMD;  [facility];  Service: Neurosurgery;  Laterality: N/A;    There were no vitals filed for this visit.   Subjective Assessment - 06/11/20 1027    Subjective "I feel like he's getting better with the tongue out. He did better yesterday - doing two sets of the exercises."    Patient is accompained by: Family member   wife   Currently in Pain? No/denies                 ADULT SLP TREATMENT - 06/11/20 1030      General Information   Behavior/Cognition  Alert;Hard of hearing;Cooperative;Pleasant mood      Treatment Provided   Treatment provided Dysphagia      Dysphagia Treatment   Other treatment/comments Wife states, "I saw a big improvement over the weekend." Pt withOUT hydrophonic voice upon entering ST room today. SLP provided SBA with wife, working with pt today with HEP. SLP took special notice wifh MeSalena Sanert is appropriately completing this exercise correctly, but wiht reduced strength. Pt req'd usual min A from wife to copmlete HEP. SLP encouraged pt and wife that he should cont to get stronger with all exercises including MeCaryl Adas he continues to complete them regularly. SLP provided information on overt s/sx aspiration pNA. Pt showed definite fatigue after 4 sets of one of each exercise. SLP cued wife this would be a good time to stop (if they were at home) and take 5 minute break and complete 4-6 more sets.       Assessment / Recommendations / Plan   Plan --   continue at once/week due to progress     Progression Toward Goals   Progression toward goals Progressing toward goals            SLP Education - 06/11/20 1521    Education Details overt s/sx aspiration PNA  Person(s) Educated Patient    Methods Explanation;Handout    Comprehension Verbalized understanding;Need further instruction            SLP Short Term Goals - 06/11/20 1523      SLP SHORT TERM GOAL #1   Title pt will demo HEP for swallowing with occasional min A from SLP or family x 2 sessions    Time 2    Period Weeks    Status On-going            SLP Long Term Goals - 06/11/20 1525      SLP LONG TERM GOAL #1   Title pt will adequately follow swallow precautions with POs with min A from SLP    Time 6    Period Weeks   or 17 sessions, for all LTGs   Status On-going      SLP LONG TERM GOAL #2   Title pt will demo HEP for swallowing with rare min A from SLP or family x 2 sessions    Time 6    Period Weeks    Status On-going        SLP LONG TERM GOAL #3   Title pt will adequately follow swallow precautions with POs with min A from wife in 2 sessions    Time 6    Period Weeks    Status On-going            Plan - 06/11/20 1522    Clinical Impression Statement Mr. [name] is curently completing dysphagia HEP for deficit muscle groups with usual mod assistance from his wife. No "wet" voice wen pt entered [facility] room. See "other comments" for more information. Wife was modified independent with assisting pt today and pt responding well to wife's cues/assistance so pt will be decr'd to once/week due to progress. Pt will benefit from skilled ST to assess accuracy of HEP, and his safety with POs, and to cont to educate wife on cueing strategies for pt to complete HEP, and use aspiration precautions.    Speech Therapy Frequency 1x /week    Duration 8 weeks   or 17 sessoins   Treatment/Interventions Aspiration precaution training;Pharyngeal strengthening exercises;Diet toleration management by SLP;Trials of upgraded texture/liquids;Cueing hierarchy;SLP instruction and feedback;Compensatory strategies;Internal/external aids;Patient/family education    Potential to Achieve Goals Fair    Potential Considerations Previous level of function;Cooperation/participation level;Severity of impairments    Consulted and Agree with Plan of Care Patient           Patient will benefit from skilled therapeutic intervention in order to improve the following deficits and impairments:   Oropharyngeal dysphagia    Problem List Patient Active Problem List   Diagnosis Date Noted  . Multiple myeloma not having achieved remission ([place]) 10/24/2015  . Dysphagia as late effect of stroke 07/10/2015  . Diarrhea 04/25/2015  . Cancer associated pain 04/25/2015  . Long term current use of anticoagulant therapy 04/25/2015  . Basal ganglia infarction ([place]) 02/12/2015  . Dizziness   . Expressive aphasia   . Past pointing   . Right leg  weakness   . History of stroke 02/11/2015  . CVA (cerebral infarction) 02/11/2015  . Aspiration pneumonia ([place]) 02/11/2015  . Aphasia   . Difficulty speaking   . Speech difficult to understand   . Encounter for antineoplastic chemotherapy 01/31/2015  . Constipation 12/20/2014  . Rash 12/20/2014  . Peripheral edema 12/06/2014  . Multifactorial gait disorder 09/05/2014  . Multiple myeloma ([place])   .  Paraplegia ([place]) 06/21/2014  . Postoperative anemia due to acute blood loss 06/21/2014  . 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 Spine metastasis ([place]) 06/17/2014  . Thoracic spine tumor 06/17/2014  . Metastatic bone cancer ([place]) 06/15/2014    [facility] ,[place], [place]  06/11/2020, 3:25 PM  [facility] [address] [place], [place], [address] Phone: [phone]   Fax:  [phone]   Name: Jeremiah Owens MRN: [id number] Date of Birth: 1929-09-02

## 2020-06-13 ENCOUNTER — Other Ambulatory Visit: Payer: Self-pay

## 2020-06-13 ENCOUNTER — Ambulatory Visit: Payer: Medicare Other

## 2020-06-13 ENCOUNTER — Ambulatory Visit: Payer: Medicare Other | Admitting: Speech Pathology

## 2020-06-13 DIAGNOSIS — R2689 Other abnormalities of gait and mobility: Secondary | ICD-10-CM

## 2020-06-13 DIAGNOSIS — R2681 Unsteadiness on feet: Secondary | ICD-10-CM | POA: Diagnosis not present

## 2020-06-13 DIAGNOSIS — R296 Repeated falls: Secondary | ICD-10-CM | POA: Diagnosis not present

## 2020-06-13 DIAGNOSIS — R1312 Dysphagia, oropharyngeal phase: Secondary | ICD-10-CM | POA: Diagnosis not present

## 2020-06-13 DIAGNOSIS — M6281 Muscle weakness (generalized): Secondary | ICD-10-CM | POA: Diagnosis not present

## 2020-06-13 DIAGNOSIS — R293 Abnormal posture: Secondary | ICD-10-CM | POA: Diagnosis not present

## 2020-06-13 NOTE — Patient Instructions (Signed)
Access Code: [email or web address] URL: [email or web address] Date: 06/13/2020 Prepared by: Baldomero Lamy  Exercises Sit to Stand with Armchair - 1 x daily - 7 x weekly - 2 sets - 5 reps Seated March with Resistance - 2 x daily - 5 x weekly - 2 sets - 10 reps Seated Scapular Retraction - 2 x daily - 5 x weekly - 2 sets - 10 reps Seated Hamstring Stretch - 3 x daily - 5 x weekly - 1 sets - 3 reps - 30 sec hold Romberg Stance - 1 x daily - 5 x weekly - 1 sets - 3 reps Wide Stance with Head Rotations and Unilateral Counter Support - 1 x daily - 5 x weekly - 1 sets - 10 reps Wide Stance with Head Nods and Unilateral Counter Support - 1 x daily - 5 x weekly - 1 sets - 10 reps Side to Side Weight Shift with Unilateral Counter Support - 1 x daily - 5 x weekly - 1 sets - 10 reps

## 2020-06-13 NOTE — Therapy (Signed)
[facility] [address] [place], [place], [address] Phone: [phone]   Fax:  [phone]  Physical Therapy Treatment  Patient Details  Name: Jeremiah Owens MRN: [id number] Date of Birth: 1930/06/03 Referring Provider (PT): [name], MD   Encounter Date: 06/13/2020   PT End of Session - 06/13/20 1106    Visit Number 10    Number of Visits 17    Date for PT Re-Evaluation 08/05/20   POC 8 weeks, cert 90 days   Authorization Type Medicare    Progress Note Due on Visit 10    PT Start Time 1102    PT Stop Time 6073    PT Time Calculation (min) 43 min    Equipment Utilized During Treatment Gait belt    Activity Tolerance Patient tolerated treatment well    Behavior During Therapy [facility] for tasks assessed/performed           Past Medical History:  Diagnosis Date   Allergy    Anxiety    Bone cancer ([place])    t_spine T-5   Compression fracture    Encounter for antineoplastic chemotherapy 01/31/2015   Hearing loss    Melanoma ([place])    Multiple myeloma ([place])    Prostate cancer ([place]) 2002   S/P radiation therapy 08/21/14-09/04/14   T4-6 25Gy/69f   S/P radiation therapy 11/29/14-12/12/14   rt prox humerus/shoulder/scapula 25Gy/137f  Skin cancer 1994   melanoma  right neck    Stroke ([facility]   Thyroid disease     Past Surgical History:  Procedure Laterality Date   HERNIA REPAIR     POSTERIOR CERVICAL FUSION/FORAMINOTOMY N/A 06/17/2014   Procedure: Thoracic five laminectomy for epidural tumor resection Thoracic 4-7 posterior lateral arthrodesis, segmental pedicle screw fixation.;  Surgeon: KyAshok PallMD;  Location: [facility];  Service: Neurosurgery;  Laterality: N/A;    There were no vitals filed for this visit.   Subjective Assessment - 06/13/20 1108    Subjective Patient reports no falls since last visit. Patient/wife reporting that he does have cellulitis in the RLE. Is being treated  with medications. No other new complaints/concerns.    Pertinent History Anxiety, Multiple Myeloma with Thoracic Spinal METS, Stroke (L Basal Ganglia Infarct with R Hemiparesis, Thyroid Disease, Adhesive Capsulitis (L Shoulder), Dysphagia (d/t CVA    Patient Stated Goals get well; get balance back    Currently in Pain? No/denies                   [facility]ercise - 06/13/20 1114      Transfers   Transfers Sit to Stand;Stand to Sit    Sit to Stand 5: Supervision    Sit to Stand Details Verbal cues for technique;Manual facilitation for weight shifting    Stand to Sit 5: Supervision    Stand to Sit Details (indicate cue type and reason) Verbal cues for technique    Comments completed sit <> stand training, 2 x 5 reps. Progressed HEP to include extra set.       Ambulation/Gait   Ambulation/Gait Yes    Ambulation/Gait Assistance 5: Supervision    Ambulation/Gait Assistance Details completed gait into/out of therapy session    Assistive device Rollator    Gait Pattern Step-through pattern;Decreased step length - right;Decreased step length - left;Decreased dorsiflexion - right;Decreased dorsiflexion - left;Right flexed knee in stance;Left flexed knee in stance;Trunk flexed    Ambulation Surface Level;Indoor  Exercises   Exercises Other Exercises;Knee/Hip    Other Exercises  Completed seated march x 10 reps without resistance, patient able to tolerate completion with yellow theraband x 10 reps, and maintain form. Completed scapular retraction x 10 reps, verbal cues for improved motion and posture needed.       Knee/Hip Exercises: Stretches   Active Hamstring Stretch Both;2 reps;30 seconds;Limitations    Active Hamstring Stretch Limitations verbal cues for form, patient feels stretch with minimal leaning forward            Completed entire review of current HEP and progressing as tolerated by patient. Patient tolerating all exercises well, and completing  with minimal cues.    Access Code: [email or web address] URL: [email or web address] Date: 06/13/2020 Prepared by: Baldomero Lamy  Exercises Sit to Stand with Armchair - 1 x daily - 7 x weekly - 2 sets - 5 reps Seated March with Resistance - 2 x daily - 5 x weekly - 2 sets - 10 reps Seated Scapular Retraction - 2 x daily - 5 x weekly - 2 sets - 10 reps Seated Hamstring Stretch - 3 x daily - 5 x weekly - 1 sets - 3 reps - 30 sec hold Romberg Stance - 1 x daily - 5 x weekly - 1 sets - 3 reps Wide Stance with Head Rotations and Unilateral Counter Support - 1 x daily - 5 x weekly - 1 sets - 10 reps Wide Stance with Head Nods and Unilateral Counter Support - 1 x daily - 5 x weekly - 1 sets - 10 reps Side to Side Weight Shift with Unilateral Counter Support - 1 x daily - 5 x weekly - 1 sets - 10 reps       PT Short Term Goals - 06/06/20 1125      PT SHORT TERM GOAL #1   Title Patient will be independent with initial HEP for strengthening/balance/posture (All STGs Due: 06/04/2020)    Baseline reports complaince/independence    Time 4    Period Weeks    Status Achieved    Target Date 06/04/20      PT SHORT TERM GOAL #2   Title Patient will improve TUG to </= 20 seconds to demonstrate improved mobility and reduced fall risk    Baseline 24.75 secs, 21.9 secs    Time 4    Period Weeks    Status On-going      PT SHORT TERM GOAL #3   Title Patient wil improve Berg Balance to >/= 37/56 to demonstrate reduced risk for falls and improved balance.    Baseline 32/56, 35/56    Time 4    Period Weeks    Status On-going      PT SHORT TERM GOAL #4   Title Patient will demo ability to ambulate >400 ft with LRAD and supervision level for improved community mobility    Baseline 415 ft supervisoin with rollator    Time 4    Period Weeks    Status Achieved             PT Long Term Goals - 05/07/20 1653      PT LONG TERM GOAL #1   Title Patient will report independence with final HEP  for strengthening/balance/posture (ALL LTGs due: 07/02/20)    Baseline no HEP established    Time 8    Period Weeks    Status New    Target Date 07/02/20      PT LONG TERM GOAL #2  Title Patient will demo ability to ambulate > 300 ft over outdoor paved surfaces with LRAD and superivison for improved community mobility    Baseline outdoor ambulation not assessed to date    Time 8    Period Weeks    Status New      PT LONG TERM GOAL #3   Title Pt will perform TUG test in </= 15 sconds to indicate decreased risk of falling    Baseline 24.75 secs    Time 8    Period Weeks    Status New      PT LONG TERM GOAL #4   Title Patient will improve Berg Balance to >/= 45/56 to demonstrate improved functional standing balance and reduced fall risk.    Baseline 32/56    Time 8    Period Weeks    Status New      PT LONG TERM GOAL #5   Title Patient will improve gait speed to >/= 2.5 ft/sec to demonstrate improved community ambulation    Baseline 1.67 ft/sec    Time 8    Period Weeks    Status New                 Plan - 06/13/20 1207    Clinical Impression Statement Today's skilled PT session included review of patient's current HEP and progressed as tolerated by patient. Patient doing well with all exercises, and able to tolerate added resistance or increases in reps/set. Will continue to progress toward all goals.    Personal Factors and Comorbidities Comorbidity 3+;Time since onset of injury/illness/exacerbation;Age    Comorbidities Anxiety, Multiple Myeloma with Thoracic Spinal METS, Stroke (L Basal Ganglia Infarct with R Hemiparesis, Thyroid Disease, Adhesive Capsulitis (L Shoulder), Dysphagia (d/t CVA)    Examination-Activity Limitations Transfers;Stairs;Stand;Locomotion Level;Sit;Bend    Examination-Participation Restrictions Community Activity;Yard Work    [employer]chant [employer] officer Evolving/Moderate complexity    Rehab Potential Good    PT Frequency 2x / week     PT Duration 8 weeks    PT Treatment/Interventions ADLs/Self Care Home Management;Moist Heat;Cryotherapy;Therapeutic activities;DME Instruction;Gait training;Stair training;Functional mobility training;Therapeutic exercise;Balance training;Patient/family education;Orthotic Fit/Training;Neuromuscular re-education;Manual techniques;Passive range of motion;Vestibular    PT Next Visit Plan Continue to progress HEP with seated/standing strengthening. Incorporate more low-level balance exercises. Gait and transfer training.    PT Home Exercise Plan PT to incorporate supine hip strengthening exercises to HEP.    Consulted and Agree with Plan of Care Patient           Patient will benefit from skilled therapeutic intervention in order to improve the following deficits and impairments:  Abnormal gait, Decreased endurance, Decreased balance, Decreased mobility, Difficulty walking, Postural dysfunction, Pain, Decreased strength, Decreased knowledge of use of DME, Decreased activity tolerance, Decreased range of motion  Visit Diagnosis: Unsteadiness on feet  Repeated falls  Other abnormalities of gait and mobility  Muscle weakness (generalized)     Problem List Patient Active Problem List   Diagnosis Date Noted   Multiple myeloma not having achieved remission ([employer]) 10/24/2015   Dysphagia as late effect of stroke 07/10/2015   Diarrhea 04/25/2015   Cancer associated pain 04/25/2015   Long term current use of anticoagulant therapy 04/25/2015   Basal ganglia infarction ([place]) 02/12/2015   Dizziness    Expressive aphasia    Past pointing    Right leg weakness    History of stroke 02/11/2015   CVA (cerebral infarction) 02/11/2015   Aspiration pneumonia ([place]) 02/11/2015   Aphasia  Difficulty speaking    Speech difficult to understand    Encounter for antineoplastic chemotherapy 01/31/2015   Constipation 12/20/2014   Rash 12/20/2014   Peripheral edema 12/06/2014    Multifactorial gait disorder 09/05/2014   Multiple myeloma ([place])    Paraplegia ([place]) 06/21/2014   Postoperative anemia due to acute blood loss 06/21/2014   Neoplasm of thoracic spine 06/20/2014   Spine metastasis ([place]) 06/17/2014   Thoracic spine tumor 06/17/2014   Metastatic bone cancer ([place]) 06/15/2014    Jones Bales, PT, DPT 06/13/2020, 12:13 PM  [facility] [address] [place], [place], [address] Phone: [phone]   Fax:  [phone]  Name: Jeremiah Owens MRN: [id number] Date of Birth: October 24, 1929

## 2020-06-15 ENCOUNTER — Other Ambulatory Visit: Payer: Self-pay

## 2020-06-15 ENCOUNTER — Ambulatory Visit: Payer: Medicare Other

## 2020-06-15 DIAGNOSIS — R1312 Dysphagia, oropharyngeal phase: Secondary | ICD-10-CM | POA: Diagnosis not present

## 2020-06-15 DIAGNOSIS — M6281 Muscle weakness (generalized): Secondary | ICD-10-CM

## 2020-06-15 DIAGNOSIS — R2689 Other abnormalities of gait and mobility: Secondary | ICD-10-CM

## 2020-06-15 DIAGNOSIS — R296 Repeated falls: Secondary | ICD-10-CM | POA: Diagnosis not present

## 2020-06-15 DIAGNOSIS — R2681 Unsteadiness on feet: Secondary | ICD-10-CM | POA: Diagnosis not present

## 2020-06-15 DIAGNOSIS — R293 Abnormal posture: Secondary | ICD-10-CM | POA: Diagnosis not present

## 2020-06-15 NOTE — Therapy (Signed)
[facility] [address] [place], [place], [address] Phone: [phone]   Fax:  [phone]  Physical Therapy Treatment  Patient Details  Name: Jeremiah Owens MRN: [id number] Date of Birth: Nov 10, 1929 Referring Provider (PT): [name], MD   Encounter Date: 06/15/2020   PT End of Session - 06/15/20 1102    Visit Number 11    Number of Visits 17    Date for PT Re-Evaluation 08/05/20   POC 8 weeks, cert 90 days   Authorization Type Medicare    Progress Note Due on Visit 10    PT Start Time 1101    PT Stop Time 1144    PT Time Calculation (min) 43 min    Equipment Utilized During Treatment Gait belt    Activity Tolerance Patient tolerated treatment well    Behavior During Therapy WFL for tasks assessed/performed           Past Medical History:  Diagnosis Date  . Allergy   . Anxiety   . Bone cancer ([place])    t_spine T-5  . Compression fracture   . Encounter for antineoplastic chemotherapy 01/31/2015  . Hearing loss   . Melanoma ([place])   . Multiple myeloma ([place])   . Prostate cancer ([place]) 2002  . S/P radiation therapy 08/21/14-09/04/14   T4-6 25Gy/15f  . S/P radiation therapy 11/29/14-12/12/14   rt prox humerus/shoulder/scapula 25Gy/183f . Skin cancer 1994   melanoma  right neck   . Stroke ([facility]  . Thyroid disease     Past Surgical History:  Procedure Laterality Date  . HERNIA REPAIR    . POSTERIOR CERVICAL FUSION/FORAMINOTOMY N/A 06/17/2014   Procedure: Thoracic five laminectomy for epidural tumor resection Thoracic 4-7 posterior lateral arthrodesis, segmental pedicle screw fixation.;  Surgeon: KyAshok PallMD;  [facility];  Service: Neurosurgery;  Laterality: N/A;    There were no vitals filed for this visit.   Subjective Assessment - 06/15/20 1106    Subjective Patient reports that wife is being up more medications for the RLE due to cellulitis. No pain. No falls to report. Reports  exercises are going well.    Pertinent History Anxiety, Multiple Myeloma with Thoracic Spinal METS, Stroke (L Basal Ganglia Infarct with R Hemiparesis, Thyroid Disease, Adhesive Capsulitis (L Shoulder), Dysphagia (d/t CVA    Patient Stated Goals get well; get balance back    Currently in Pain? No/denies                  [facility]20 1107      Transfers   Transfers Sit to Stand;Stand to Sit    Sit to Stand 5: Supervision    Sit to Stand Details Verbal cues for technique;Manual facilitation for weight shifting    Stand to Sit 5: Supervision    Stand to Sit Details (indicate cue type and reason) Verbal cues for technique    Comments continued sit <> stand training x 10 reps with emphasis on improved forward lean with completion. PT providing manual faciliation and tactile/verbal cues for proper completion      Ambulation/Gait   Ambulation/Gait Yes    Ambulation/Gait Assistance 5: Supervision    Ambulation/Gait Assistance Details completed ambulation with rollator. continue to provide vebral cues for improved step length. PT educating on use of scuffing of shoe as cues to improved steps and ensure patient picks up feet to avoid tripping.     Ambulation Distance (Feet) 230 Feet  Assistive device Rollator    Gait Pattern Step-through pattern;Decreased step length - right;Decreased step length - left;Decreased dorsiflexion - right;Decreased dorsiflexion - left;Right flexed knee in stance;Left flexed knee in stance;Trunk flexed    Ambulation Surface Level;Indoor    Gait Comments patient rollator brakes are lose, tools were not available to be tightened. PT educating on patient/wife taking rollator to place of purchase to have brakes tightened.       Self-Care   Self-Care Other Self-Care Comments    Other Self-Care Comments  PT continued to assess RLE, improvements noted in edema and redness.       Neuro Re-ed    Neuro Re-ed Details  Standing in // bars  without UE support and narrow BOS, completed EO x 1 minute, then progressed to completing with EC, 3 x 30 seconds. To 4" step completed alternating toe taps x 10 reps with BUE support, x 5 reps with single UE support, and x 5 reps with no UE support bilaterally.       Exercises   Exercises Knee/Hip      Knee/Hip Exercises: Aerobic   Other Aerobic Completed SciFit on Level 2.5 x 6 minutes with BLE only for improved strengthening and reciprocal movement. patient educated to keep RPM > 40 to maintain pace.       Knee/Hip Exercises: Standing   Forward Step Up Both;1 set;10 reps;Hand Hold: 2;Step Height: 4";Limitations    Forward Step Up Limitations completed in // bars with BUE support, verbal cues for improved posture with completion.                     PT Short Term Goals - 06/06/20 1125      PT SHORT TERM GOAL #1   Title Patient will be independent with initial HEP for strengthening/balance/posture (All STGs Due: 06/04/2020)    Baseline reports complaince/independence    Time 4    Period Weeks    Status Achieved    Target Date 06/04/20      PT SHORT TERM GOAL #2   Title Patient will improve TUG to </= 20 seconds to demonstrate improved mobility and reduced fall risk    Baseline 24.75 secs, 21.9 secs    Time 4    Period Weeks    Status On-going      PT SHORT TERM GOAL #3   Title Patient wil improve Berg Balance to >/= 37/56 to demonstrate reduced risk for falls and improved balance.    Baseline 32/56, 35/56    Time 4    Period Weeks    Status On-going      PT SHORT TERM GOAL #4   Title Patient will demo ability to ambulate >400 ft with LRAD and supervision level for improved community mobility    Baseline 415 ft supervisoin with rollator    Time 4    Period Weeks    Status Achieved             PT Long Term Goals - 05/07/20 1653      PT LONG TERM GOAL #1   Title Patient will report independence with final HEP for strengthening/balance/posture (ALL LTGs due:  07/02/20)    Baseline no HEP established    Time 8    Period Weeks    Status New    Target Date 07/02/20      PT LONG TERM GOAL #2   Title Patient will demo ability to ambulate > 300 ft over outdoor paved surfaces  with LRAD and superivison for improved community mobility    Baseline outdoor ambulation not assessed to date    Time 8    Period Weeks    Status New      PT LONG TERM GOAL #3   Title Pt will perform TUG test in </= 15 sconds to indicate decreased risk of falling    Baseline 24.75 secs    Time 8    Period Weeks    Status New      PT LONG TERM GOAL #4   Title Patient will improve Berg Balance to >/= 45/56 to demonstrate improved functional standing balance and reduced fall risk.    Baseline 32/56    Time 8    Period Weeks    Status New      PT LONG TERM GOAL #5   Title Patient will improve gait speed to >/= 2.5 ft/sec to demonstrate improved community ambulation    Baseline 1.67 ft/sec    Time 8    Period Weeks    Status New                 Plan - 06/15/20 1138    Clinical Impression Statement Continued progression of balance exercises and strengthening exercises in standing today, with patient tolerating well. Continued sit <> standing training with emphasis on forward lean as patient continue to demo dififculty with technique. Intermittent rest breaks required due to fatigue, but overall patient continue to demo progress with PT services. Will continue to progress toward goals, continue per POC.    Personal Factors and Comorbidities Comorbidity 3+;Time since onset of injury/illness/exacerbation;Age    Comorbidities Anxiety, Multiple Myeloma with Thoracic Spinal METS, Stroke (L Basal Ganglia Infarct with R Hemiparesis, Thyroid Disease, Adhesive Capsulitis (L Shoulder), Dysphagia (d/t CVA)    Examination-Activity Limitations Transfers;Stairs;Stand;Locomotion Level;Sit;Bend    Examination-Participation Restrictions Community Activity;Yard Work     [employer]chant [employer] officer Evolving/Moderate complexity    Rehab Potential Good    PT Frequency 2x / week    PT Duration 8 weeks    PT Treatment/Interventions ADLs/Self Care Home Management;Moist Heat;Cryotherapy;Therapeutic activities;DME Instruction;Gait training;Stair training;Functional mobility training;Therapeutic exercise;Balance training;Patient/family education;Orthotic Fit/Training;Neuromuscular re-education;Manual techniques;Passive range of motion;Vestibular    PT Next Visit Plan Continue to progress HEP with seated/standing strengthening. Incorporate more low-level balance exercises. Gait and transfer training.    PT Home Exercise Plan PT to incorporate supine hip strengthening exercises to HEP.    Consulted and Agree with Plan of Care Patient           Patient will benefit from skilled therapeutic intervention in order to improve the following deficits and impairments:  Abnormal gait, Decreased endurance, Decreased balance, Decreased mobility, Difficulty walking, Postural dysfunction, Pain, Decreased strength, Decreased knowledge of use of DME, Decreased activity tolerance, Decreased range of motion  Visit Diagnosis: Unsteadiness on feet  Repeated falls  Other abnormalities of gait and mobility  Muscle weakness (generalized)  Abnormal posture     Problem List Patient Active Problem List   Diagnosis Date Noted  . Multiple myeloma not having achieved remission ([place]) 10/24/2015  . Dysphagia as late effect of stroke 07/10/2015  . Diarrhea 04/25/2015  . Cancer associated pain 04/25/2015  . Long term current use of anticoagulant therapy 04/25/2015  . Basal ganglia infarction ([place]) 02/12/2015  . Dizziness   . Expressive aphasia   . Past pointing   . Right leg weakness   . History of stroke 02/11/2015  . CVA (cerebral infarction) 02/11/2015  .  Aspiration pneumonia ([place]) 02/11/2015  . Aphasia   . Difficulty speaking   . Speech difficult to understand   .  Encounter for antineoplastic chemotherapy 01/31/2015  . Constipation 12/20/2014  . Rash 12/20/2014  . Peripheral edema 12/06/2014  . Multifactorial gait disorder 09/05/2014  . Multiple myeloma ([place])   . Paraplegia ([place]) 06/21/2014  . Postoperative anemia due to acute blood loss 06/21/2014  . 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 Spine metastasis ([place]) 06/17/2014  . Thoracic spine tumor 06/17/2014  . Metastatic bone cancer ([place]) 06/15/2014    Jones Bales, PT, DPT 06/15/2020, 11:47 AM  [facility] [address] [place], [place], [address] Phone: [phone]   Fax:  [phone]  Name: Jeremiah Owens MRN: [id number] Date of Birth: [birth year]

## 2020-06-18 ENCOUNTER — Ambulatory Visit: Payer: Medicare Other

## 2020-06-18 ENCOUNTER — Other Ambulatory Visit: Payer: Self-pay

## 2020-06-18 DIAGNOSIS — R296 Repeated falls: Secondary | ICD-10-CM | POA: Diagnosis not present

## 2020-06-18 DIAGNOSIS — R1312 Dysphagia, oropharyngeal phase: Secondary | ICD-10-CM | POA: Diagnosis not present

## 2020-06-18 DIAGNOSIS — R293 Abnormal posture: Secondary | ICD-10-CM

## 2020-06-18 DIAGNOSIS — R2689 Other abnormalities of gait and mobility: Secondary | ICD-10-CM

## 2020-06-18 DIAGNOSIS — M6281 Muscle weakness (generalized): Secondary | ICD-10-CM | POA: Diagnosis not present

## 2020-06-18 DIAGNOSIS — R2681 Unsteadiness on feet: Secondary | ICD-10-CM | POA: Diagnosis not present

## 2020-06-18 NOTE — Therapy (Signed)
[facility] [address] [place], [place], [address] Phone: [phone]   Fax:  [phone]  Physical Therapy Treatment  Patient Details  Name: Jeremiah Owens MRN: [id number] Date of Birth: 1930-07-30 Referring Provider (PT): [name], MD   Encounter Date: 06/18/2020   PT End of Session - 06/18/20 1110    Visit Number 12    Number of Visits 17    Date for PT Re-Evaluation 08/05/20   POC 8 weeks, cert 90 days   Authorization Type Medicare    Progress Note Due on Visit 10    PT Start Time 1104    PT Stop Time 1145    PT Time Calculation (min) 41 min    Equipment Utilized During Treatment Gait belt    Activity Tolerance Patient tolerated treatment well    Behavior During Therapy WFL for tasks assessed/performed           Past Medical History:  Diagnosis Date  . Allergy   . Anxiety   . Bone cancer ([place])    t_spine T-5  . Compression fracture   . Encounter for antineoplastic chemotherapy 01/31/2015  . Hearing loss   . Melanoma ([place])   . Multiple myeloma ([place])   . Prostate cancer ([place]) 2002  . S/P radiation therapy 08/21/14-09/04/14   T4-6 25Gy/63f  . S/P radiation therapy 11/29/14-12/12/14   rt prox humerus/shoulder/scapula 25Gy/135f . Skin cancer 1994   melanoma  right neck   . Stroke ([facility]  . Thyroid disease     Past Surgical History:  Procedure Laterality Date  . HERNIA REPAIR    . POSTERIOR CERVICAL FUSION/FORAMINOTOMY N/A 06/17/2014   Procedure: Thoracic five laminectomy for epidural tumor resection Thoracic 4-7 posterior lateral arthrodesis, segmental pedicle screw fixation.;  Surgeon: KyAshok PallMD;  [facility];  Service: Neurosurgery;  Laterality: N/A;    There were no vitals filed for this visit.   Subjective Assessment - 06/18/20 1110    Subjective Patient reports that he got more antibiotic medications for RLE. Reports no falls, but does have 1-2 near falls reports. No  pain    Pertinent History Anxiety, Multiple Myeloma with Thoracic Spinal METS, Stroke (L Basal Ganglia Infarct with R Hemiparesis, Thyroid Disease, Adhesive Capsulitis (L Shoulder), Dysphagia (d/t CVA    Patient Stated Goals get well; get balance back    Currently in Pain? No/denies                  [facility] Adult PT Treatment/Exercise - 06/18/20 0001      Transfers   Transfers Sit to Stand;Stand to Sit    Sit to Stand 5: Supervision    Sit to Stand Details Verbal cues for technique;Manual facilitation for weight shifting    Stand to Sit 5: Supervision    Stand to Sit Details (indicate cue type and reason) Verbal cues for technique    Number of Reps Other reps (comment)   5   Comments completed sit <> stand throuhout session, intermittent vebral cues for forward lean      Ambulation/Gait   Ambulation/Gait Yes    Ambulation/Gait Assistance 5: Supervision    Ambulation/Gait Assistance Details completed ambulation x 250 ft with rollator, verbal cues for improved toe clearance and hip/knee flexion on RLE. HR: 91 sp02: 99%    Ambulation Distance (Feet) 250 Feet    Assistive device Rollator    Gait Pattern Step-through pattern;Decreased step length - right;Decreased step length - left;Decreased  dorsiflexion - right;Decreased dorsiflexion - left;Right flexed knee in stance;Left flexed knee in stance;Trunk flexed    Ambulation Surface Level;Indoor      High Level Balance   High Level Balance Activities Backward walking;Marching forwards    High Level Balance Comments at countertop with single UE support, completed alternating marching forwards followed by backwards walking, completed x 4 laps down and back. verbal cues for improved step length with backwards walking. intermittent CGA      Neuro Re-ed    Neuro Re-ed Details  Standing at countertop with staggered stance completed x 10 reps weight shifting A/P, alterantgin foot position and completed x 10 reps. increaed balance challenge  with RLE postioned posterior. With targets placed in front and back working on improved step forward/back to colored target, completed x 10 reps bilaterally. increased diffiuclty/imbalance noted with stepping with LLE. CGA required.  Verbal cues required for improved step length on LLE to promote stance on RLE. Vitals after completion, HR: 94, sp02; 100%      Exercises   Exercises Other Exercises    Other Exercises  Seated in chair without back support completed seated rows with emphasis on scapular retraction x 10 reps, with verbal cues for technique. Completed forward leaning to power up and opening chest with addition of scapular retraction upon rising, completed x 10 reps. intermittent verbal cues for form.                     PT Short Term Goals - 06/06/20 1125      PT SHORT TERM GOAL #1   Title Patient will be independent with initial HEP for strengthening/balance/posture (All STGs Due: 06/04/2020)    Baseline reports complaince/independence    Time 4    Period Weeks    Status Achieved    Target Date 06/04/20      PT SHORT TERM GOAL #2   Title Patient will improve TUG to </= 20 seconds to demonstrate improved mobility and reduced fall risk    Baseline 24.75 secs, 21.9 secs    Time 4    Period Weeks    Status On-going      PT SHORT TERM GOAL #3   Title Patient wil improve Berg Balance to >/= 37/56 to demonstrate reduced risk for falls and improved balance.    Baseline 32/56, 35/56    Time 4    Period Weeks    Status On-going      PT SHORT TERM GOAL #4   Title Patient will demo ability to ambulate >400 ft with LRAD and supervision level for improved community mobility    Baseline 415 ft supervisoin with rollator    Time 4    Period Weeks    Status Achieved             PT Long Term Goals - 05/07/20 1653      PT LONG TERM GOAL #1   Title Patient will report independence with final HEP for strengthening/balance/posture (ALL LTGs due: 07/02/20)    Baseline no  HEP established    Time 8    Period Weeks    Status New    Target Date 07/02/20      PT LONG TERM GOAL #2   Title Patient will demo ability to ambulate > 300 ft over outdoor paved surfaces with LRAD and superivison for improved community mobility    Baseline outdoor ambulation not assessed to date    Time 8    Period Weeks  Status New      PT LONG TERM GOAL #3   Title Pt will perform TUG test in </= 15 sconds to indicate decreased risk of falling    Baseline 24.75 secs    Time 8    Period Weeks    Status New      PT LONG TERM GOAL #4   Title Patient will improve Berg Balance to >/= 45/56 to demonstrate improved functional standing balance and reduced fall risk.    Baseline 32/56    Time 8    Period Weeks    Status New      PT LONG TERM GOAL #5   Title Patient will improve gait speed to >/= 2.5 ft/sec to demonstrate improved community ambulation    Baseline 1.67 ft/sec    Time 8    Period Weeks    Status New                 Plan - 06/18/20 1201    Clinical Impression Statement Continued standing balance exercises with focus on improved step length and incorporating backwards stepping and backwards walking as patient reports this is when he feels imbalance. Patient tolerating well today. Continued activites to promote improved posture in seated. Patient will continue to benefit from skilled PT services, continue per POC.    Personal Factors and Comorbidities Comorbidity 3+;Time since onset of injury/illness/exacerbation;Age    Comorbidities Anxiety, Multiple Myeloma with Thoracic Spinal METS, Stroke (L Basal Ganglia Infarct with R Hemiparesis, Thyroid Disease, Adhesive Capsulitis (L Shoulder), Dysphagia (d/t CVA)    Examination-Activity Limitations Transfers;Stairs;Stand;Locomotion Level;Sit;Bend    Examination-Participation Restrictions Community Activity;Yard Work    [employer]chant [employer] officer Evolving/Moderate complexity    Rehab Potential Good    PT  Frequency 2x / week    PT Duration 8 weeks    PT Treatment/Interventions ADLs/Self Care Home Management;Moist Heat;Cryotherapy;Therapeutic activities;DME Instruction;Gait training;Stair training;Functional mobility training;Therapeutic exercise;Balance training;Patient/family education;Orthotic Fit/Training;Neuromuscular re-education;Manual techniques;Passive range of motion;Vestibular    PT Next Visit Plan Continue to progress HEP with seated/standing strengthening. Incorporate more low-level balance exercises. Gait and transfer training.    PT Home Exercise Plan PT to incorporate supine hip strengthening exercises to HEP.    Consulted and Agree with Plan of Care Patient           Patient will benefit from skilled therapeutic intervention in order to improve the following deficits and impairments:  Abnormal gait, Decreased endurance, Decreased balance, Decreased mobility, Difficulty walking, Postural dysfunction, Pain, Decreased strength, Decreased knowledge of use of DME, Decreased activity tolerance, Decreased range of motion  Visit Diagnosis: Unsteadiness on feet  Repeated falls  Other abnormalities of gait and mobility  Muscle weakness (generalized)  Abnormal posture     Problem List Patient Active Problem List   Diagnosis Date Noted  . Multiple myeloma not having achieved remission ([place]) 10/24/2015  . Dysphagia as late effect of stroke 07/10/2015  . Diarrhea 04/25/2015  . Cancer associated pain 04/25/2015  . Long term current use of anticoagulant therapy 04/25/2015  . Basal ganglia infarction ([place]) 02/12/2015  . Dizziness   . Expressive aphasia   . Past pointing   . Right leg weakness   . History of stroke 02/11/2015  . CVA (cerebral infarction) 02/11/2015  . Aspiration pneumonia ([place]) 02/11/2015  . Aphasia   . Difficulty speaking   . Speech difficult to understand   . Encounter for antineoplastic chemotherapy 01/31/2015  . Constipation 12/20/2014  . Rash  12/20/2014  . Peripheral edema  12/06/2014  . Multifactorial gait disorder 09/05/2014  . Multiple myeloma ([place])   . Paraplegia ([place]) 06/21/2014  . Postoperative anemia due to acute blood loss 06/21/2014  . 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 Spine metastasis ([place]) 06/17/2014  . Thoracic spine tumor 06/17/2014  . Metastatic bone cancer ([place]) 06/15/2014    Jones Bales, PT, DPT 06/18/2020, 12:04 PM  [facility] [address] [place], [place], [address] Phone: [phone]   Fax:  [phone]  Name: Jeremiah Owens MRN: [id number] Date of Birth: Feb 11, 1930

## 2020-06-19 ENCOUNTER — Ambulatory Visit: Payer: Medicare Other

## 2020-06-20 ENCOUNTER — Ambulatory Visit: Payer: Medicare Other

## 2020-06-20 ENCOUNTER — Other Ambulatory Visit: Payer: Self-pay

## 2020-06-20 DIAGNOSIS — R2689 Other abnormalities of gait and mobility: Secondary | ICD-10-CM

## 2020-06-20 DIAGNOSIS — R2681 Unsteadiness on feet: Secondary | ICD-10-CM

## 2020-06-20 DIAGNOSIS — R296 Repeated falls: Secondary | ICD-10-CM | POA: Diagnosis not present

## 2020-06-20 DIAGNOSIS — R1312 Dysphagia, oropharyngeal phase: Secondary | ICD-10-CM

## 2020-06-20 DIAGNOSIS — M6281 Muscle weakness (generalized): Secondary | ICD-10-CM

## 2020-06-20 DIAGNOSIS — R293 Abnormal posture: Secondary | ICD-10-CM

## 2020-06-20 NOTE — Therapy (Signed)
[facility] [address] [place], [place], [address] Phone: [phone]   Fax:  [phone]  Physical Therapy Treatment  Patient Details  Name: Jeremiah Owens MRN: [id number] Date of Birth: June 22, 1930 Referring Provider (PT): [name], MD   Encounter Date: 06/20/2020   PT End of Session - 06/20/20 1103    Visit Number 13    Number of Visits 17    Date for PT Re-Evaluation 08/05/20   POC 8 weeks, cert 90 days   Authorization Type Medicare    Progress Note Due on Visit 10    PT Start Time 1100    PT Stop Time 1145    PT Time Calculation (min) 45 min    Equipment Utilized During Treatment Gait belt    Activity Tolerance Patient tolerated treatment well    Behavior During Therapy WFL for tasks assessed/performed           Past Medical History:  Diagnosis Date  . Allergy   . Anxiety   . Bone cancer ([place])    t_spine T-5  . Compression fracture   . Encounter for antineoplastic chemotherapy 01/31/2015  . Hearing loss   . Melanoma ([place])   . Multiple myeloma ([place])   . Prostate cancer ([place]) 2002  . S/P radiation therapy 08/21/14-09/04/14   T4-6 25Gy/44f  . S/P radiation therapy 11/29/14-12/12/14   rt prox humerus/shoulder/scapula 25Gy/136f . Skin cancer 1994   melanoma  right neck   . Stroke ([facility]  . Thyroid disease     Past Surgical History:  Procedure Laterality Date  . HERNIA REPAIR    . POSTERIOR CERVICAL FUSION/FORAMINOTOMY N/A 06/17/2014   Procedure: Thoracic five laminectomy for epidural tumor resection Thoracic 4-7 posterior lateral arthrodesis, segmental pedicle screw fixation.;  Surgeon: KyAshok PallMD;  [facility];  Service: Neurosurgery;  Laterality: N/A;    There were no vitals filed for this visit.   Subjective Assessment - 06/20/20 1103    Subjective Patient reports no new changes since last visit. No falls. Patient reports no pain this morning. Reports that he is elevating  the leg when he is seated watching tv.    Pertinent History Anxiety, Multiple Myeloma with Thoracic Spinal METS, Stroke (L Basal Ganglia Infarct with R Hemiparesis, Thyroid Disease, Adhesive Capsulitis (L Shoulder), Dysphagia (d/t CVA    Patient Stated Goals get well; get balance back    Currently in Pain? No/denies               [facility] Adult PT Treatment/Exercise - 06/20/20 0001      Bed Mobility   Supine to Sit Supervision/Verbal cueing    Sit to Supine Supervision/Verbal cueing      Transfers   Transfers Sit to Stand;Stand to Sit    Sit to Stand 5: Supervision    Sit to Stand Details Verbal cues for technique;Manual facilitation for weight shifting    Stand to Sit 5: Supervision    Stand to Sit Details (indicate cue type and reason) Verbal cues for technique    Comments with sit <> stand x 2 reps in session, patient still require vebral cues for sequencing      Ambulation/Gait   Ambulation/Gait Yes    Ambulation/Gait Assistance 5: Supervision    Ambulation/Gait Assistance Details into/out of therapy session    Ambulation Distance (Feet) --   clinic distances   Assistive device Rollator    Gait Pattern Step-through pattern;Decreased step length - right;Decreased step  length - left;Decreased dorsiflexion - right;Decreased dorsiflexion - left;Right flexed knee in stance;Left flexed knee in stance;Trunk flexed    Ambulation Surface Level;Indoor      Neuro Re-ed    Neuro Re-ed Details  Standing at countertop with staggered stance completed  weight shifts in anterior/posterior x 10 reps with UE support and progressed to no UE support, CGA required when completing without UE support. Altered foot position and completed again. With narrow BOS and no UE support completed horizontal/vertical head turns x 10 reps. With single UE support completed alternating toe taps to colored pebbles x 10 reps bilaterally. Increased balance challenge overall with activites when no UE support utilized.         Exercises   Exercises Knee/Hip      Knee/Hip Exercises: Supine   Hip Adduction Isometric Strengthening;Left;2 sets;10 reps;Limitations    Hip Adduction Isometric Limitations completed isometric with ball squeeze with 3 second hold. rest break required between set.     Bridges Strengthening;Both;2 sets;10 reps    Bridges Limitations verbal cues for technique with completion    Other Supine Knee/Hip Exercises Completed bent knee fall outs with red theraband 2 sets x 10 reps bilaterally. Verbal cues for technique with completion, and tactile cues to keep opposite lower extremity stable with completion. Completed alteranting marching with red theraband around thighs, completed x 10 reps with verbal cues for slowed pace.     Other Supine Knee/Hip Exercises all exercises completed with head elevated with wedge + 2 pillows. intermittent rest breaks required, and seated rest to allow for consuption of water.                     PT Short Term Goals - 06/06/20 1125      PT SHORT TERM GOAL #1   Title Patient will be independent with initial HEP for strengthening/balance/posture (All STGs Due: 06/04/2020)    Baseline reports complaince/independence    Time 4    Period Weeks    Status Achieved    Target Date 06/04/20      PT SHORT TERM GOAL #2   Title Patient will improve TUG to </= 20 seconds to demonstrate improved mobility and reduced fall risk    Baseline 24.75 secs, 21.9 secs    Time 4    Period Weeks    Status On-going      PT SHORT TERM GOAL #3   Title Patient wil improve Berg Balance to >/= 37/56 to demonstrate reduced risk for falls and improved balance.    Baseline 32/56, 35/56    Time 4    Period Weeks    Status On-going      PT SHORT TERM GOAL #4   Title Patient will demo ability to ambulate >400 ft with LRAD and supervision level for improved community mobility    Baseline 415 ft supervisoin with rollator    Time 4    Period Weeks    Status Achieved              PT Long Term Goals - 05/07/20 1653      PT LONG TERM GOAL #1   Title Patient will report independence with final HEP for strengthening/balance/posture (ALL LTGs due: 07/02/20)    Baseline no HEP established    Time 8    Period Weeks    Status New    Target Date 07/02/20      PT LONG TERM GOAL #2   Title Patient will demo ability  to ambulate > 300 ft over outdoor paved surfaces with LRAD and superivison for improved community mobility    Baseline outdoor ambulation not assessed to date    Time 8    Period Weeks    Status New      PT LONG TERM GOAL #3   Title Pt will perform TUG test in </= 15 sconds to indicate decreased risk of falling    Baseline 24.75 secs    Time 8    Period Weeks    Status New      PT LONG TERM GOAL #4   Title Patient will improve Berg Balance to >/= 45/56 to demonstrate improved functional standing balance and reduced fall risk.    Baseline 32/56    Time 8    Period Weeks    Status New      PT LONG TERM GOAL #5   Title Patient will improve gait speed to >/= 2.5 ft/sec to demonstrate improved community ambulation    Baseline 1.67 ft/sec    Time 8    Period Weeks    Status New                 Plan - 06/20/20 1158    Clinical Impression Statement Initiated BLE strengthening exercises in supine today, with patient tolerating well. Did require head elevated with wedge + 2 pillows, and intermittent upright seated rest breaks. Verbal cues required intermittently for proper technique. Continued standing balance working on reduced UE support as tolerated by patient with CGA. Will continue to progress toward all LTGs.    Personal Factors and Comorbidities Comorbidity 3+;Time since onset of injury/illness/exacerbation;Age    Comorbidities Anxiety, Multiple Myeloma with Thoracic Spinal METS, Stroke (L Basal Ganglia Infarct with R Hemiparesis, Thyroid Disease, Adhesive Capsulitis (L Shoulder), Dysphagia (d/t CVA)    Examination-Activity Limitations  Transfers;Stairs;Stand;Locomotion Level;Sit;Bend    Examination-Participation Restrictions Community Activity;Yard Work    [employer] officer Evolving/Moderate complexity    Rehab Potential Good    PT Frequency 2x / week    PT Duration 8 weeks    PT Treatment/Interventions ADLs/Self Care Home Management;Moist Heat;Cryotherapy;Therapeutic activities;DME Instruction;Gait training;Stair training;Functional mobility training;Therapeutic exercise;Balance training;Patient/family education;Orthotic Fit/Training;Neuromuscular re-education;Manual techniques;Passive range of motion;Vestibular    PT Next Visit Plan Continue to progress HEP with seated/standing strengthening. Incorporate more low-level balance exercises. Gait and transfer training.    PT Home Exercise Plan PT to incorporate supine hip strengthening exercises to HEP.    Consulted and Agree with Plan of Care Patient           Patient will benefit from skilled therapeutic intervention in order to improve the following deficits and impairments:  Abnormal gait, Decreased endurance, Decreased balance, Decreased mobility, Difficulty walking, Postural dysfunction, Pain, Decreased strength, Decreased knowledge of use of DME, Decreased activity tolerance, Decreased range of motion  Visit Diagnosis: Unsteadiness on feet  Repeated falls  Other abnormalities of gait and mobility  Muscle weakness (generalized)  Abnormal posture     Problem List Patient Active Problem List   Diagnosis Date Noted  . Multiple myeloma not having achieved remission ([place]) 10/24/2015  . Dysphagia as late effect of stroke 07/10/2015  . Diarrhea 04/25/2015  . Cancer associated pain 04/25/2015  . Long term current use of anticoagulant therapy 04/25/2015  . Basal ganglia infarction ([place]) 02/12/2015  . Dizziness   . Expressive aphasia   . Past pointing   . Right leg weakness   . History of stroke 02/11/2015  . CVA (cerebral  infarction)  02/11/2015  . Aspiration pneumonia ([place]) 02/11/2015  . Aphasia   . Difficulty speaking   . Speech difficult to understand   . Encounter for antineoplastic chemotherapy 01/31/2015  . Constipation 12/20/2014  . Rash 12/20/2014  . Peripheral edema 12/06/2014  . Multifactorial gait disorder 09/05/2014  . Multiple myeloma ([place])   . Paraplegia ([place]) 06/21/2014  . Postoperative anemia due to acute blood loss 06/21/2014  . 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 Spine metastasis ([place]) 06/17/2014  . Thoracic spine tumor 06/17/2014  . Metastatic bone cancer ([place]) 06/15/2014    Jones Bales, PT, DPT 06/20/2020, 12:00 PM  [facility] [address] [place], [place], [address] Phone: [phone]   Fax:  [phone]  Name: Jeremiah Owens MRN: [id number] Date of Birth: [birth year]

## 2020-06-20 NOTE — Therapy (Signed)
[facility] [address] [place], [place], [address] Phone: [phone]   Fax:  [phone]  Speech Language Pathology Treatment  Patient Details  Name: Jeremiah Owens MRN: [id number] Date of Birth: [birth year] Referring Provider (SLP): [name], MD   Encounter Date: 06/20/2020   End of Session - 06/20/20 1127    Visit Number 6    Number of Visits 17    Date for SLP Re-Evaluation 08/17/20   90 days   SLP Start Time 1018    SLP Stop Time  1057    SLP Time Calculation (min) 39 min    Activity Tolerance Patient tolerated treatment well           Past Medical History:  Diagnosis Date  . Allergy   . Anxiety   . Bone cancer ([place])    t_spine T-5  . Compression fracture   . Encounter for antineoplastic chemotherapy 01/31/2015  . Hearing loss   . Melanoma ([place])   . Multiple myeloma ([place])   . Prostate cancer ([place]) 2002  . S/P radiation therapy 08/21/14-09/04/14   T4-6 25Gy/12f  . S/P radiation therapy 11/29/14-12/12/14   rt prox humerus/shoulder/scapula 25Gy/198f . Skin cancer 1994   melanoma  right neck   . Stroke ([facility]  . Thyroid disease     Past Surgical History:  Procedure Laterality Date  . HERNIA REPAIR    . POSTERIOR CERVICAL FUSION/FORAMINOTOMY N/A 06/17/2014   Procedure: Thoracic five laminectomy for epidural tumor resection Thoracic 4-7 posterior lateral arthrodesis, segmental pedicle screw fixation.;  Surgeon: KyAshok PallMD;  [facility];  Service: Neurosurgery;  Laterality: N/A;    There were no vitals filed for this visit.   Subjective Assessment - 06/20/20 1029    Subjective "I'm getting stronger" - pt.   Wife agrees.    Patient is accompained by: Family member   wife   Currently in Pain? No/denies                 ADULT SLP TREATMENT - 06/20/20 1029      General Information   Behavior/Cognition Alert;Hard of hearing;Cooperative;Pleasant mood      Treatment  Provided   Treatment provided Dysphagia      Dysphagia Treatment   Temperature Spikes Noted No    Respiratory Status Room air    Treatment Methods Skilled observation;Therapeutic exercise;Compensation strategy training;Patient/caregiver education    Patient observed directly with PO's Yes    Type of PO's observed Dysphagia 3 (soft);Thin liquids    Liquids provided via Cup    Oral Phase Signs & Symptoms --   none noted   Pharyngeal Phase Signs & Symptoms Wet vocal quality   after bite/sip 50% of the time; throughout session   Other treatment/comments Given pt hydrophonic voice throughout session SLP reiterated to pt and wife that pt may need cueing for hard throat clear and reswallow to alleviate "wet" voice. SLP again explained/educated on what hydrophonia is and why necessary to swallow after performing a hard throat clear or cough. Pt is not following swallow precautions at home because it takes him too long to eat. SLP explained rationale for these precautions and pt demonstrated understanding. SLP reiterated to pt/wife the overt s/sx aspiration PNA. Pt without any overt s/sx of PNA today; has no hx of PNA in previous 2 years, per pt and wife. SLP had to cue pt to press chin down hard on chin tuck against resistance. Pt definitely looks stronger  with Mendnelsohn and better sequencing with super-supraglottic. SLP used SBA with pt as wife cueing pt and completing these with him to ensure proper procedure. Pt completing more of HEP correctly either before or during wife's cues. This indicates he is remembering more accurately how to perform the HEP.      Assessment / Recommendations / Plan   Plan --   decr to once/week due to progress     Progression Toward Goals   Progression toward goals Progressing toward goals            SLP Education - 06/20/20 1126    Education Details overt s/sx aspiration PNA, rationale for precautions, rationale for throat clear/cough and reswallow, press harder for  chin tuck against resistance(towel hold)    Person(s) Educated Patient;Spouse    Methods Explanation;Demonstration    Comprehension Verbalized understanding;Returned demonstration;Verbal cues required;Need further instruction            SLP Short Term Goals - 06/20/20 1130      SLP SHORT TERM GOAL #1   Title pt will demo HEP for swallowing with occasional min A from SLP or family x 2 sessions    Baseline 06-20-20    Status Partially Met            SLP Long Term Goals - 06/20/20 1130      SLP LONG TERM GOAL #1   Title pt will adequately follow swallow precautions with POs with min A from SLP    Time 5    Period Weeks   or 17 sessions, for all LTGs   Status On-going      SLP LONG TERM GOAL #2   Title pt will demo HEP for swallowing with rare min A from SLP or family x 2 sessions    Time 5    Period Weeks    Status On-going      SLP LONG TERM GOAL #3   Title pt will adequately follow swallow precautions with POs with min A from wife in 2 sessions    Time 5    Period Weeks    Status On-going            Plan - 06/20/20 1127    Clinical Impression Statement Jeremiah Owens is curently completing dysphagia HEP for deficit muscle groups with usual mod assistance from his wife however appears today to perfrom some exercises correctly prior to wife giving him step by step directions. No "wet" voice when pt entered ST room this day. See "other comments" for more information. Wife was again modified independent with assisting pt today and pt responding well to wife's cues/assistance so pt will be decr'd to once/week due to progress. Pt will benefit from skilled ST to assess accuracy of HEP, and his safety with POs, and to cont to educate wife on cueing strategies for pt to complete HEP, and use aspiration precautions.    Speech Therapy Frequency 1x /week    Duration 8 weeks   or 17 sessoins   Treatment/Interventions Aspiration precaution training;Pharyngeal strengthening exercises;Diet  toleration management by SLP;Trials of upgraded texture/liquids;Cueing hierarchy;SLP instruction and feedback;Compensatory strategies;Internal/external aids;Patient/family education    Potential to Achieve Goals Fair    Potential Considerations Previous level of function;Cooperation/participation level;Severity of impairments    Consulted and Agree with Plan of Care Patient           Patient will benefit from skilled therapeutic intervention in order to improve the following deficits and impairments:   Oropharyngeal dysphagia  Problem List Patient Active Problem List   Diagnosis Date Noted  . Multiple myeloma not having achieved remission ([place]) 10/24/2015  . Dysphagia as late effect of stroke 07/10/2015  . Diarrhea 04/25/2015  . Cancer associated pain 04/25/2015  . Long term current use of anticoagulant therapy 04/25/2015  . Basal ganglia infarction ([place]) 02/12/2015  . Dizziness   . Expressive aphasia   . Past pointing   . Right leg weakness   . History of stroke 02/11/2015  . CVA (cerebral infarction) 02/11/2015  . Aspiration pneumonia ([place]) 02/11/2015  . Aphasia   . Difficulty speaking   . Speech difficult to understand   . Encounter for antineoplastic chemotherapy 01/31/2015  . Constipation 12/20/2014  . Rash 12/20/2014  . Peripheral edema 12/06/2014  . Multifactorial gait disorder 09/05/2014  . Multiple myeloma ([place])   . Paraplegia ([place]) 06/21/2014  . Postoperative anemia due to acute blood loss 06/21/2014  . 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 Spine metastasis ([place]) 06/17/2014  . Thoracic spine tumor 06/17/2014  . Metastatic bone cancer ([place]) 06/15/2014    [facility] ,[place], [place]  06/20/2020, 11:31 AM  [facility] [address] [place], [place], [address] Phone: [phone]   Fax:  [phone]   Name: Jeremiah Owens MRN: [id number] Date of Birth: Apr 22, 1930

## 2020-06-25 ENCOUNTER — Ambulatory Visit: Payer: Medicare Other

## 2020-06-25 ENCOUNTER — Other Ambulatory Visit: Payer: Self-pay

## 2020-06-25 DIAGNOSIS — R293 Abnormal posture: Secondary | ICD-10-CM

## 2020-06-25 DIAGNOSIS — R2681 Unsteadiness on feet: Secondary | ICD-10-CM | POA: Diagnosis not present

## 2020-06-25 DIAGNOSIS — R1312 Dysphagia, oropharyngeal phase: Secondary | ICD-10-CM | POA: Diagnosis not present

## 2020-06-25 DIAGNOSIS — R2689 Other abnormalities of gait and mobility: Secondary | ICD-10-CM

## 2020-06-25 DIAGNOSIS — M6281 Muscle weakness (generalized): Secondary | ICD-10-CM | POA: Diagnosis not present

## 2020-06-25 DIAGNOSIS — R296 Repeated falls: Secondary | ICD-10-CM

## 2020-06-25 NOTE — Therapy (Signed)
[facility] [address] [place], [place], [address] Phone: [phone]   Fax:  [phone]  Physical Therapy Treatment  Patient Details  Name: Jeremiah Owens MRN: [id number] Date of Birth: [birth year] Referring Provider (PT): [name], MD   Encounter Date: 06/25/2020   PT End of Session - 06/25/20 1231    Visit Number 14    Number of Visits 17    Date for PT Re-Evaluation 08/05/20   POC 8 weeks, cert 90 days   Authorization Type Medicare    Progress Note Due on Visit 10    PT Start Time 1231    PT Stop Time 1313    PT Time Calculation (min) 42 min    Equipment Utilized During Treatment Gait belt    Activity Tolerance Patient tolerated treatment well    Behavior During Therapy WFL for tasks assessed/performed           Past Medical History:  Diagnosis Date  . Allergy   . Anxiety   . Bone cancer ([place])    t_spine T-5  . Compression fracture   . Encounter for antineoplastic chemotherapy 01/31/2015  . Hearing loss   . Melanoma ([place])   . Multiple myeloma ([place])   . Prostate cancer ([place]) 2002  . S/P radiation therapy 08/21/14-09/04/14   T4-6 25Gy/86f  . S/P radiation therapy 11/29/14-12/12/14   rt prox humerus/shoulder/scapula 25Gy/14f . Skin cancer 1994   melanoma  right neck   . [place]  . Thyroid disease     Past Surgical History:  Procedure Laterality Date  . HERNIA REPAIR    . POSTERIOR CERVICAL FUSION/FORAMINOTOMY N/A 06/17/2014   Procedure: Thoracic five laminectomy for epidural tumor resection Thoracic 4-7 posterior lateral arthrodesis, segmental pedicle screw fixation.;  Surgeon: KyAshok PallMD;  [facility];  Service: Neurosurgery;  Laterality: N/A;    There were no vitals filed for this visit.   Subjective Assessment - 06/25/20 1235    Subjective Patient reports swelling in leg keeps getting better. Has a follow up visit scheduled next week with PCP and Dr. SwNaaman PlummerNo  falls to report.    Pertinent History Anxiety, Multiple Myeloma with Thoracic Spinal METS, Stroke (L Basal Ganglia Infarct with R Hemiparesis, Thyroid Disease, Adhesive Capsulitis (L Shoulder), Dysphagia (d/t CVA    Patient Stated Goals get well; get balance back    Currently in Pain? No/denies                             [facility] Adult PT Treatment/Exercise - 06/25/20 0001      Transfers   Transfers Sit to Stand;Stand to Sit    Sit to Stand 5: Supervision    Sit to Stand Details Verbal cues for technique;Manual facilitation for weight shifting    Stand to Sit 5: Supervision    Stand to Sit Details (indicate cue type and reason) Verbal cues for technique    Comments completed sit <> stand training x 5 reps with BUE support, with emphasis on improved forward lean. verbal cues for improved posture upon standing.       Ambulation/Gait   Ambulation/Gait Yes    Ambulation/Gait Assistance 5: Supervision    Ambulation/Gait Assistance Details in/out of therapy session with rollator, and throughout therapy session with activites     Ambulation Distance (Feet) --   clinic distances   Assistive device Rollator    Gait Pattern Step-through  pattern;Decreased step length - right;Decreased step length - left;Decreased dorsiflexion - right;Decreased dorsiflexion - left;Right flexed knee in stance;Left flexed knee in stance;Trunk flexed    Ambulation Surface Level;Indoor      High Level Balance   High Level Balance Activities Side stepping;Backward walking;Marching forwards    High Level Balance Comments in // bars completed the following balance exercises x 3 laps each of the following: side stepping, marching forwards and backwards walking. PT providing verbal cues for improved step length with RLE with side stepping and backwards walking. Light BUE support required for completion of all activites      Neuro Re-ed    Neuro Re-ed Details  In // bars completing forward stepping over and  back black foam beam x 10 each foot with more difficulty noticed with RLE.  Standing on blue mat completd the following balance exercises including standing without UE support, completed x 10 reps of horizontal/vertical head turns. PT providing verbal cues for improved alignment, and requiring one CGA with vertical head turn due to increased balance challenge. Standing on firm surface, completed reaching to cone as target, x 10 reps in various directoins. Progressed to completing on blue mat x 10 reps, increased challenge with targets on right side due to icnreased challenge in weight shift onto RLE.       Exercises   Exercises Knee/Hip      Knee/Hip Exercises: Aerobic   Other Aerobic Completed SciFit on Level 2.5 x 6 minutes with BLE only for improved strengthening and reciprocal movements. Verbal cues to maintain above > 45 RPM.                     PT Short Term Goals - 06/06/20 1125      PT SHORT TERM GOAL #1   Title Patient will be independent with initial HEP for strengthening/balance/posture (All STGs Due: 06/04/2020)    Baseline reports complaince/independence    Time 4    Period Weeks    Status Achieved    Target Date 06/04/20      PT SHORT TERM GOAL #2   Title Patient will improve TUG to </= 20 seconds to demonstrate improved mobility and reduced fall risk    Baseline 24.75 secs, 21.9 secs    Time 4    Period Weeks    Status On-going      PT SHORT TERM GOAL #3   Title Patient wil improve Berg Balance to >/= 37/56 to demonstrate reduced risk for falls and improved balance.    Baseline 32/56, 35/56    Time 4    Period Weeks    Status On-going      PT SHORT TERM GOAL #4   Title Patient will demo ability to ambulate >400 ft with LRAD and supervision level for improved community mobility    Baseline 415 ft supervisoin with rollator    Time 4    Period Weeks    Status Achieved             PT Long Term Goals - 05/07/20 1653      PT LONG TERM GOAL #1   Title  Patient will report independence with final HEP for strengthening/balance/posture (ALL LTGs due: 07/02/20)    Baseline no HEP established    Time 8    Period Weeks    Status New    Target Date 07/02/20      PT LONG TERM GOAL #2   Title Patient will demo ability to  ambulate > 300 ft over outdoor paved surfaces with LRAD and superivison for improved community mobility    Baseline outdoor ambulation not assessed to date    Time 8    Period Weeks    Status New      PT LONG TERM GOAL #3   Title Pt will perform TUG test in </= 15 sconds to indicate decreased risk of falling    Baseline 24.75 secs    Time 8    Period Weeks    Status New      PT LONG TERM GOAL #4   Title Patient will improve Berg Balance to >/= 45/56 to demonstrate improved functional standing balance and reduced fall risk.    Baseline 32/56    Time 8    Period Weeks    Status New      PT LONG TERM GOAL #5   Title Patient will improve gait speed to >/= 2.5 ft/sec to demonstrate improved community ambulation    Baseline 1.67 ft/sec    Time 8    Period Weeks    Status New                 Plan - 06/25/20 1240    Clinical Impression Statement Continued strengthening activites in standing, as well as balance exercises working on improved standing with reduced UE reliance. Patient tolerating well, and demo improvements in balance with PT services. Continue to require intermittent rest breaks due to fatigue and SOB. Will continue per POC.    Personal Factors and Comorbidities Comorbidity 3+;Time since onset of injury/illness/exacerbation;Age    Comorbidities Anxiety, Multiple Myeloma with Thoracic Spinal METS, Stroke (L Basal Ganglia Infarct with R Hemiparesis, Thyroid Disease, Adhesive Capsulitis (L Shoulder), Dysphagia (d/t CVA)    Examination-Activity Limitations Transfers;Stairs;Stand;Locomotion Level;Sit;Bend    Examination-Participation Restrictions Community Activity;Yard Work    Multimedia programmer Evolving/Moderate complexity    Rehab Potential Good    PT Frequency 2x / week    PT Duration 8 weeks    PT Treatment/Interventions ADLs/Self Care Home Management;Moist Heat;Cryotherapy;Therapeutic activities;DME Instruction;Gait training;Stair training;Functional mobility training;Therapeutic exercise;Balance training;Patient/family education;Orthotic Fit/Training;Neuromuscular re-education;Manual techniques;Passive range of motion;Vestibular    PT Next Visit Plan Continue to progress HEP with seated/standing strengthening. Incorporate more low-level balance exercises. Gait and transfer training.    PT Home Exercise Plan PT to incorporate supine hip strengthening exercises to HEP.    Consulted and Agree with Plan of Care Patient           Patient will benefit from skilled therapeutic intervention in order to improve the following deficits and impairments:  Abnormal gait, Decreased endurance, Decreased balance, Decreased mobility, Difficulty walking, Postural dysfunction, Pain, Decreased strength, Decreased knowledge of use of DME, Decreased activity tolerance, Decreased range of motion  Visit Diagnosis: Abnormal posture  Unsteadiness on feet  Repeated falls  Other abnormalities of gait and mobility  Muscle weakness (generalized)     Problem List Patient Active Problem List   Diagnosis Date Noted  . Multiple myeloma not having achieved remission ([place]) 10/24/2015  . Dysphagia as late effect of stroke 07/10/2015  . Diarrhea 04/25/2015  . Cancer associated pain 04/25/2015  . Long term current use of anticoagulant therapy 04/25/2015  . Basal ganglia infarction ([place]) 02/12/2015  . Dizziness   . Expressive aphasia   . Past pointing   . Right leg weakness   . History of stroke 02/11/2015  . CVA (cerebral infarction) 02/11/2015  . Aspiration pneumonia ([place]) 02/11/2015  . Aphasia   .  Difficulty speaking   . Speech difficult to understand   . Encounter for antineoplastic  chemotherapy 01/31/2015  . Constipation 12/20/2014  . Rash 12/20/2014  . Peripheral edema 12/06/2014  . Multifactorial gait disorder 09/05/2014  . Multiple myeloma ([place])   . Paraplegia ([place]) 06/21/2014  . Postoperative anemia due to acute blood loss 06/21/2014  . 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 Spine metastasis ([place]) 06/17/2014  . Thoracic spine tumor 06/17/2014  . Metastatic bone cancer ([place]) 06/15/2014    Jones Bales, PT, DPT 06/25/2020, 1:15 PM  [facility] [address] [place], [place], [address] Phone: [phone]   Fax:  [phone]  Name: Jeremiah Owens MRN: [id number] Date of Birth: 06/17/1930

## 2020-06-26 DIAGNOSIS — L814 Other melanin hyperpigmentation: Secondary | ICD-10-CM | POA: Diagnosis not present

## 2020-06-26 DIAGNOSIS — L821 Other seborrheic keratosis: Secondary | ICD-10-CM | POA: Diagnosis not present

## 2020-06-26 DIAGNOSIS — Z85820 Personal history of malignant melanoma of skin: Secondary | ICD-10-CM | POA: Diagnosis not present

## 2020-06-26 DIAGNOSIS — L570 Actinic keratosis: Secondary | ICD-10-CM | POA: Diagnosis not present

## 2020-06-26 DIAGNOSIS — Z85828 Personal history of other malignant neoplasm of skin: Secondary | ICD-10-CM | POA: Diagnosis not present

## 2020-06-26 DIAGNOSIS — D225 Melanocytic nevi of trunk: Secondary | ICD-10-CM | POA: Diagnosis not present

## 2020-06-27 ENCOUNTER — Ambulatory Visit: Payer: Medicare Other

## 2020-06-27 ENCOUNTER — Other Ambulatory Visit: Payer: Self-pay

## 2020-06-27 DIAGNOSIS — R296 Repeated falls: Secondary | ICD-10-CM

## 2020-06-27 DIAGNOSIS — M6281 Muscle weakness (generalized): Secondary | ICD-10-CM

## 2020-06-27 DIAGNOSIS — R1312 Dysphagia, oropharyngeal phase: Secondary | ICD-10-CM | POA: Diagnosis not present

## 2020-06-27 DIAGNOSIS — R2681 Unsteadiness on feet: Secondary | ICD-10-CM

## 2020-06-27 DIAGNOSIS — R2689 Other abnormalities of gait and mobility: Secondary | ICD-10-CM

## 2020-06-27 DIAGNOSIS — R293 Abnormal posture: Secondary | ICD-10-CM

## 2020-06-27 NOTE — Therapy (Signed)
[facility] [address] [place], [place], [address] Phone: [phone]   Fax:  [phone]  Physical Therapy Treatment  Patient Details  Name: Jeremiah Owens MRN: [id number] Date of Birth: 10-02-1929 Referring Provider (PT): [name], MD   Encounter Date: 06/27/2020   PT End of Session - 06/27/20 1101    Visit Number 15    Number of Visits 17    Date for PT Re-Evaluation 08/05/20   POC 8 weeks, cert 90 days   Authorization Type Medicare    Progress Note Due on Visit 10    PT Start Time 1101    PT Stop Time 1144    PT Time Calculation (min) 43 min    Equipment Utilized During Treatment Gait belt    Activity Tolerance Patient tolerated treatment well    Behavior During Therapy WFL for tasks assessed/performed           Past Medical History:  Diagnosis Date  . Allergy   . Anxiety   . Bone cancer ([place])    t_spine T-5  . Compression fracture   . Encounter for antineoplastic chemotherapy 01/31/2015  . Hearing loss   . Melanoma ([place])   . Multiple myeloma ([place])   . Prostate cancer ([place]) 2002  . S/P radiation therapy 08/21/14-09/04/14   T4-6 25Gy/15f  . S/P radiation therapy 11/29/14-12/12/14   rt prox humerus/shoulder/scapula 25Gy/133f . Skin cancer 1994   melanoma  right neck   . St[place] ([facility]  . Thyroid disease     Past Surgical History:  Procedure Laterality Date  . HERNIA REPAIR    . POSTERIOR CERVICAL FUSION/FORAMINOTOMY N/A 06/17/2014   Procedure: Thoracic five laminectomy for epidural tumor resection Thoracic 4-7 posterior lateral arthrodesis, segmental pedicle screw fixation.;  Surgeon: KyAshok PallMD;  [facility];  Service: Neurosurgery;  Laterality: N/A;    There were no vitals filed for this visit.   Subjective Assessment - 06/27/20 1105    Subjective Patient reports no new changes. Excited to have family in for the holiday. No falls to report. No pain this morning.     Pertinent History Anxiety, Multiple Myeloma with Thoracic Spinal METS, Stroke (L Basal Ganglia Infarct with R Hemiparesis, Thyroid Disease, Adhesive Capsulitis (L Shoulder), Dysphagia (d/t CVA    Patient Stated Goals get well; get balance back    Currently in Pain? No/denies                [facility] Adult PT Treatment/Exercise - 06/27/20 0001      Transfers   Transfers Sit to Stand;Stand to Sit    Sit to Stand 5: Supervision    Sit to Stand Details Verbal cues for technique;Manual facilitation for weight shifting    Sit to Stand Details (indicate cue type and reason) continue to provide verbal cues for improved forward lean    Stand to Sit 5: Supervision    Stand to Sit Details (indicate cue type and reason) Verbal cues for technique    Comments completed sit <> stand training with emphasis on forward lean with coming up to standing, and with working on control and hand placement prior to descent. Completed x 6 reps. Patient demo improved carryover with sit <> stand training today in session.       Ambulation/Gait   Ambulation/Gait Yes    Ambulation/Gait Assistance 5: Supervision    Ambulation/Gait Assistance Details completed ambulatoin x 200 ft around therapy gym with rollator, including verbal cues  for turns, backwards walking, and sudden stops. Patient demo increased difficulty with turning to R due to decreased stepping and picking up RLE, verbal cues required. Overlal with verbal cues for improved step length on RLE. No instances of imbaalnce overall with ambulation.     Ambulation Distance (Feet) 250 Feet    Assistive device Rollator    Gait Pattern Step-through pattern;Decreased step length - right;Decreased step length - left;Decreased dorsiflexion - right;Decreased dorsiflexion - left;Right flexed knee in stance;Left flexed knee in stance;Trunk flexed    Ambulation Surface Level;Indoor    Stairs Yes    Stairs Assistance 4: Min guard    Stairs Assistance Details (indicate cue  type and reason) completed ascending initially with B rails and alternating pattern, increased balance challenge noted. PT educating on ascending/descending with step to pattern for improved safety. Completed second set with step to pattern and bilateral rails. CGA throughout. Heavy reliance on UE support. Verbal cues for foot placement and improved posture.     Stair Management Technique Two rails;Forwards;Alternating pattern;Step to pattern    Number of Stairs 8    Height of Stairs 6      High Level Balance   High Level Balance Activities Negotiating over obstacles;Negotitating around obstacles    High Level Balance Comments Completed ambulation with rollator including negotiating around obstalces inlcuding cones completed turns around, and including negoitaitng/stepping over colored pebbles. Completed x 4 laps down and back.       Knee/Hip Exercises: Aerobic   Other Aerobic Completed SciFit on Level 2.5 x 7 minutes with BLE only for improved strengthening and endurance. Verbal cues to maintain above > 40 RPM. At end of completion, HR: 102, sp02: 99%                     PT Short Term Goals - 06/06/20 1125      PT SHORT TERM GOAL #1   Title Patient will be independent with initial HEP for strengthening/balance/posture (All STGs Due: 06/04/2020)    Baseline reports complaince/independence    Time 4    Period Weeks    Status Achieved    Target Date 06/04/20      PT SHORT TERM GOAL #2   Title Patient will improve TUG to </= 20 seconds to demonstrate improved mobility and reduced fall risk    Baseline 24.75 secs, 21.9 secs    Time 4    Period Weeks    Status On-going      PT SHORT TERM GOAL #3   Title Patient wil improve Berg Balance to >/= 37/56 to demonstrate reduced risk for falls and improved balance.    Baseline 32/56, 35/56    Time 4    Period Weeks    Status On-going      PT SHORT TERM GOAL #4   Title Patient will demo ability to ambulate >400 ft with LRAD and  supervision level for improved community mobility    Baseline 415 ft supervisoin with rollator    Time 4    Period Weeks    Status Achieved             PT Long Term Goals - 05/07/20 1653      PT LONG TERM GOAL #1   Title Patient will report independence with final HEP for strengthening/balance/posture (ALL LTGs due: 07/02/20)    Baseline no HEP established    Time 8    Period Weeks    Status New    Target  Date 07/02/20      PT LONG TERM GOAL #2   Title Patient will demo ability to ambulate > 300 ft over outdoor paved surfaces with LRAD and superivison for improved community mobility    Baseline outdoor ambulation not assessed to date    Time 8    Period Weeks    Status New      PT LONG TERM GOAL #3   Title Pt will perform TUG test in </= 15 sconds to indicate decreased risk of falling    Baseline 24.75 secs    Time 8    Period Weeks    Status New      PT LONG TERM GOAL #4   Title Patient will improve Berg Balance to >/= 45/56 to demonstrate improved functional standing balance and reduced fall risk.    Baseline 32/56    Time 8    Period Weeks    Status New      PT LONG TERM GOAL #5   Title Patient will improve gait speed to >/= 2.5 ft/sec to demonstrate improved community ambulation    Baseline 1.67 ft/sec    Time 8    Period Weeks    Status New                 Plan - 06/27/20 1148    Clinical Impression Statement Today's skilled PT session focused on gait training, and incorporation of activites into gait to further challenge balance including negotiaitng over/around obstacles, sudden stops, full turns, and backwards walking. All ambulation completed with rollator. Completed stair training with PT advising to complete with step to pattern for improved safety. Intermittent rest breaks required. Will continue to progress toward all LTGs.    Personal Factors and Comorbidities Comorbidity 3+;Time since onset of injury/illness/exacerbation;Age    Comorbidities  Anxiety, Multiple Myeloma with Thoracic Spinal METS, Stroke (L Basal Ganglia Infarct with R Hemiparesis, Thyroid Disease, Adhesive Capsulitis (L Shoulder), Dysphagia (d/t CVA)    Examination-Activity Limitations Transfers;Stairs;Stand;Locomotion Level;Sit;Bend    Examination-Participation Restrictions Community Activity;Yard Work    Merchant navy officer Evolving/Moderate complexity    Rehab Potential Good    PT Frequency 2x / week    PT Duration 8 weeks    PT Treatment/Interventions ADLs/Self Care Home Management;Moist Heat;Cryotherapy;Therapeutic activities;DME Instruction;Gait training;Stair training;Functional mobility training;Therapeutic exercise;Balance training;Patient/family education;Orthotic Fit/Training;Neuromuscular re-education;Manual techniques;Passive range of motion;Vestibular    PT Next Visit Plan Continue to progress HEP with seated/standing strengthening. Incorporate more low-level balance exercises. Gait and transfer training.    PT Home Exercise Plan PT to incorporate supine hip strengthening exercises to HEP.    Consulted and Agree with Plan of Care Patient           Patient will benefit from skilled therapeutic intervention in order to improve the following deficits and impairments:  Abnormal gait, Decreased endurance, Decreased balance, Decreased mobility, Difficulty walking, Postural dysfunction, Pain, Decreased strength, Decreased knowledge of use of DME, Decreased activity tolerance, Decreased range of motion  Visit Diagnosis: Abnormal posture  Unsteadiness on feet  Repeated falls  Other abnormalities of gait and mobility  Muscle weakness (generalized)     Problem List Patient Active Problem List   Diagnosis Date Noted  . Multiple myeloma not having achieved remission ([place]) 10/24/2015  . Dysphagia as late effect of stroke 07/10/2015  . Diarrhea 04/25/2015  . Cancer associated pain 04/25/2015  . Long term current use of anticoagulant  therapy 04/25/2015  . Basal ganglia infarction ([place]) 02/12/2015  . Dizziness   .  Expressive aphasia   . Past pointing   . Right leg weakness   . History of stroke 02/11/2015  . CVA (cerebral infarction) 02/11/2015  . Aspiration pneumonia ([place]) 02/11/2015  . Aphasia   . Difficulty speaking   . Speech difficult to understand   . Encounter for antineoplastic chemotherapy 01/31/2015  . Constipation 12/20/2014  . Rash 12/20/2014  . Peripheral edema 12/06/2014  . Multifactorial gait disorder 09/05/2014  . Multiple myeloma ([place])   . Paraplegia ([place]) 06/21/2014  . Postoperative anemia due to acute blood loss 06/21/2014  . 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 Spine metastasis ([place]) 06/17/2014  . Thoracic spine tumor 06/17/2014  . Metastatic bone cancer ([place]) 06/15/2014    Jones Bales, PT, DPT 06/27/2020, 11:56 AM  [facility] [address] [place], [place], [address] Phone: [phone]   Fax:  [phone]  Name: Elius Etheredge MRN: [id number] Date of Birth: June 22, 1930

## 2020-07-02 ENCOUNTER — Other Ambulatory Visit: Payer: Self-pay

## 2020-07-02 ENCOUNTER — Ambulatory Visit: Payer: Medicare Other

## 2020-07-02 DIAGNOSIS — M6281 Muscle weakness (generalized): Secondary | ICD-10-CM

## 2020-07-02 DIAGNOSIS — R2681 Unsteadiness on feet: Secondary | ICD-10-CM

## 2020-07-02 DIAGNOSIS — R1312 Dysphagia, oropharyngeal phase: Secondary | ICD-10-CM | POA: Diagnosis not present

## 2020-07-02 DIAGNOSIS — R296 Repeated falls: Secondary | ICD-10-CM | POA: Diagnosis not present

## 2020-07-02 DIAGNOSIS — R2689 Other abnormalities of gait and mobility: Secondary | ICD-10-CM

## 2020-07-02 DIAGNOSIS — R293 Abnormal posture: Secondary | ICD-10-CM

## 2020-07-02 NOTE — Therapy (Signed)
[facility] [address] [place], [place], [address] Phone: [phone]   Fax:  [phone]  Physical Therapy Treatment  Patient Details  Name: Jeremiah Owens MRN: [id number] Date of Birth: 1930-04-07 Referring Provider (PT): [name], MD   Encounter Date: 07/02/2020   PT End of Session - 07/02/20 1106    Visit Number 16    Number of Visits 17    Date for PT Re-Evaluation 08/05/20   POC 8 weeks, cert 90 days   Authorization Type Medicare    Progress Note Due on Visit 10    PT Start Time 1102    PT Stop Time 8115    PT Time Calculation (min) 43 min    Equipment Utilized During Treatment Gait belt    Activity Tolerance Patient tolerated treatment well    Behavior During Therapy [facility] for tasks assessed/performed           Past Medical History:  Diagnosis Date   Allergy    Anxiety    Bone cancer ([place])    t_spine T-5   Compression fracture    Encounter for antineoplastic chemotherapy 01/31/2015   Hearing loss    Melanoma ([place])    Multiple myeloma ([place])    Prostate cancer ([place]) 2002   S/P radiation therapy 08/21/14-09/04/14   T4-6 25Gy/55f   S/P radiation therapy 11/29/14-12/12/14   rt prox humerus/shoulder/scapula 25Gy/149f  Skin cancer 1994   melanoma  right neck    Stroke ([facility]   Thyroid disease     Past Surgical History:  Procedure Laterality Date   HERNIA REPAIR     POSTERIOR CERVICAL FUSION/FORAMINOTOMY N/A 06/17/2014   Procedure: Thoracic five laminectomy for epidural tumor resection Thoracic 4-7 posterior lateral arthrodesis, segmental pedicle screw fixation.;  Surgeon: KyAshok PallMD;  Location: [facility];  Service: Neurosurgery;  Laterality: N/A;    There were no vitals filed for this visit.   Subjective Assessment - 07/02/20 1106    Subjective No changes since last visit. Reports had a good busy holiday weekend. No falls to report. No pain.    Pertinent History  Anxiety, Multiple Myeloma with Thoracic Spinal METS, Stroke (L Basal Ganglia Infarct with R Hemiparesis, Thyroid Disease, Adhesive Capsulitis (L Shoulder), Dysphagia (d/t CVA    Patient Stated Goals get well; get balance back    Currently in Pain? No/denies                 [facility] Adult PT Treatment/Exercise - 07/02/20 0001      Transfers   Transfers Sit to Stand;Stand to Sit    Sit to Stand 5: Supervision    Sit to Stand Details Verbal cues for sequencing;Verbal cues for technique    Sit to Stand Details (indicate cue type and reason) continue to provide verbal cues for improved forward lean    Stand to Sit 5: Supervision    Stand to Sit Details (indicate cue type and reason) Visual cues/gestures for precautions/safety;Visual cues for safe use of DME/AE    Stand to Sit Details verbal cues required to lock brakes prior to descent and to reach back for descent      Ambulation/Gait   Ambulation/Gait Yes    Ambulation/Gait Assistance 5: Supervision    Ambulation/Gait Assistance Details completed ambulation x 100 ft with PT providing verbal cues for improved step length. Continue to demo improvemetns with verbal cues but limited carryover noted    Ambulation Distance (Feet) 100 Feet  Assistive device Rollator    Gait Pattern Step-through pattern;Decreased step length - right;Decreased step length - left;Decreased dorsiflexion - right;Decreased dorsiflexion - left;Right flexed knee in stance;Left flexed knee in stance;Trunk flexed    Ambulation Surface Level;Indoor      Standardized Balance Assessment   Standardized Balance Assessment Berg Balance Test      Berg Balance Test   Sit to Stand Able to stand  independently using hands    Standing Unsupported Able to stand safely 2 minutes    Sitting with Back Unsupported but Feet Supported on Floor or Stool Able to sit safely and securely 2 minutes    Stand to Sit Sits safely with minimal use of hands    Transfers Able to transfer with  verbal cueing and /or supervision    Standing Unsupported with Eyes Closed Able to stand 10 seconds safely    Standing Ubsupported with Feet Together Able to place feet together independently and stand for 1 minute with supervision    From Standing, Reach Forward with Outstretched Arm Can reach forward >12 cm safely (5")    From Standing Position, Pick up Object from Floor Able to pick up shoe, needs supervision    From Standing Position, Turn to Look Behind Over each Shoulder Looks behind one side only/other side shows less weight shift    Turn 360 Degrees Able to turn 360 degrees safely but slowly    Standing Unsupported, Alternately Place Feet on Step/Stool Able to complete 4 steps without aid or supervision    Standing Unsupported, One Foot in Front Able to take small step independently and hold 30 seconds    Standing on One Leg Tries to lift leg/unable to hold 3 seconds but remains standing independently    Total Score 40      Neuro Re-ed    Neuro Re-ed Details  Completed standing balance on airex pad with wide BOS: completed static standing without UE support 3 reps x 1 minute each. Progressed to completion of horizontal/vertical head turns 1 x 10 reps each. intermtitent UE suppor and CGA from PT to avoid LOB due to increased sway.       Knee/Hip Exercises: Aerobic   Other Aerobic Completed SciFit on Level 2. 5 x 7 minutes with BLE only for improved strengthening, warm up, and increased endurance. Patient continue to provide verbal cues to keep pace > 40 bpm.                     PT Short Term Goals - 06/06/20 1125      PT SHORT TERM GOAL #1   Title Patient will be independent with initial HEP for strengthening/balance/posture (All STGs Due: 06/04/2020)    Baseline reports complaince/independence    Time 4    Period Weeks    Status Achieved    Target Date 06/04/20      PT SHORT TERM GOAL #2   Title Patient will improve TUG to </= 20 seconds to demonstrate improved  mobility and reduced fall risk    Baseline 24.75 secs, 21.9 secs    Time 4    Period Weeks    Status On-going      PT SHORT TERM GOAL #3   Title Patient wil improve Berg Balance to >/= 37/56 to demonstrate reduced risk for falls and improved balance.    Baseline 32/56, 35/56    Time 4    Period Weeks    Status On-going  PT SHORT TERM GOAL #4   Title Patient will demo ability to ambulate >400 ft with LRAD and supervision level for improved community mobility    Baseline 415 ft supervisoin with rollator    Time 4    Period Weeks    Status Achieved             PT Long Term Goals - 07/02/20 1130      PT LONG TERM GOAL #1   Title Patient will report independence with final HEP for strengthening/balance/posture (ALL LTGs due: 07/02/20)    Baseline no HEP established    Time 8    Period Weeks    Status New      PT LONG TERM GOAL #2   Title Patient will demo ability to ambulate > 300 ft over outdoor paved surfaces with LRAD and superivison for improved community mobility    Baseline outdoor ambulation not assessed to date    Time 8    Period Weeks    Status New      PT LONG TERM GOAL #3   Title Pt will perform TUG test in </= 15 sconds to indicate decreased risk of falling    Baseline 24.75 secs    Time 8    Period Weeks    Status New      PT LONG TERM GOAL #4   Title Patient will improve Berg Balance to >/= 45/56 to demonstrate improved functional standing balance and reduced fall risk.    Baseline 32/56, 40/56 on 11/29    Time 8    Period Weeks    Status On-going      PT LONG TERM GOAL #5   Title Patient will improve gait speed to >/= 2.5 ft/sec to demonstrate improved community ambulation    Baseline 1.67 ft/sec    Time 8    Period Weeks    Status New                 Plan - 07/02/20 1202    Clinical Impression Statement Todays skilled PT session included beginning to assess patients progress toward LTG. Patient scored 40/56 on Berg Balance  today, demonstrating improved balance. Patient is slowly making progress toward all LTGs. Continued activities to promote improved endurance, strengthening, and balance throughout session with patient tolerating well. Continue to demo increased balance challenge on complaint surfaces. Intermittent rest breaks required. Will continue to progress toward all LTGs.    Personal Factors and Comorbidities Comorbidity 3+;Time since onset of injury/illness/exacerbation;Age    Comorbidities Anxiety, Multiple Myeloma with Thoracic Spinal METS, Stroke (L Basal Ganglia Infarct with R Hemiparesis, Thyroid Disease, Adhesive Capsulitis (L Shoulder), Dysphagia (d/t CVA)    Examination-Activity Limitations Transfers;Stairs;Stand;Locomotion Level;Sit;Bend    Examination-Participation Restrictions Community Activity;Yard Work    [employer]chant [employer] officer Evolving/Moderate complexity    Rehab Potential Good    PT Frequency 2x / week    PT Duration 8 weeks    PT Treatment/Interventions ADLs/Self Care Home Management;Moist Heat;Cryotherapy;Therapeutic activities;DME Instruction;Gait training;Stair training;Functional mobility training;Therapeutic exercise;Balance training;Patient/family education;Orthotic Fit/Training;Neuromuscular re-education;Manual techniques;Passive range of motion;Vestibular    PT Next Visit Plan Finish checking LTG + Re-Cert/Discharge. Continue to progress HEP with seated/standing strengthening. Incorporate more low-level balance exercises. Gait and transfer training.    PT Home Exercise Plan PT to incorporate supine hip strengthening exercises to HEP.    Consulted and Agree with Plan of Care Patient           Patient will benefit from skilled therapeutic intervention  in order to improve the following deficits and impairments:  Abnormal gait, Decreased endurance, Decreased balance, Decreased mobility, Difficulty walking, Postural dysfunction, Pain, Decreased strength, Decreased knowledge  of use of DME, Decreased activity tolerance, Decreased range of motion  Visit Diagnosis: Abnormal posture  Unsteadiness on feet  Repeated falls  Other abnormalities of gait and mobility  Muscle weakness (generalized)     Problem List Patient Active Problem List   Diagnosis Date Noted   Multiple myeloma not having achieved remission ([place]) 10/24/2015   Dysphagia as late effect of stroke 07/10/2015   Diarrhea 04/25/2015   Cancer associated pain 04/25/2015   Long term current use of anticoagulant therapy 04/25/2015   Basal ganglia infarction ([place]) 02/12/2015   Dizziness    Expressive aphasia    Past pointing    Right leg weakness    History of stroke 02/11/2015   CVA (cerebral infarction) 02/11/2015   Aspiration pneumonia ([place]) 02/11/2015   Aphasia    Difficulty speaking    Speech difficult to understand    Encounter for antineoplastic chemotherapy 01/31/2015   Constipation 12/20/2014   Rash 12/20/2014   Peripheral edema 12/06/2014   Multifactorial gait disorder 09/05/2014   Multiple myeloma ([place])    Paraplegia ([place]) 06/21/2014   Postoperative anemia due to acute blood loss 06/21/2014   Neoplasm of thoracic spine 06/20/2014   Spine metastasis ([place]) 06/17/2014   Thoracic spine tumor 06/17/2014   Metastatic bone cancer ([place]) 06/15/2014    Jones Bales, PT, DPT 07/02/2020, 12:04 PM  [facility] [place] [address] [place], [place], [address] Phone: [phone]   Fax:  [phone]  Name: Ivan Lacher MRN: [id number] Date of Birth: 1930/01/17

## 2020-07-03 DIAGNOSIS — Z8546 Personal history of malignant neoplasm of prostate: Secondary | ICD-10-CM | POA: Diagnosis not present

## 2020-07-03 DIAGNOSIS — C439 Malignant melanoma of skin, unspecified: Secondary | ICD-10-CM | POA: Diagnosis not present

## 2020-07-03 DIAGNOSIS — I6340 Cerebral infarction due to embolism of unspecified cerebral artery: Secondary | ICD-10-CM | POA: Diagnosis not present

## 2020-07-03 DIAGNOSIS — D696 Thrombocytopenia, unspecified: Secondary | ICD-10-CM | POA: Diagnosis not present

## 2020-07-03 DIAGNOSIS — M6281 Muscle weakness (generalized): Secondary | ICD-10-CM | POA: Diagnosis not present

## 2020-07-03 DIAGNOSIS — R7309 Other abnormal glucose: Secondary | ICD-10-CM | POA: Diagnosis not present

## 2020-07-03 DIAGNOSIS — Z1389 Encounter for screening for other disorder: Secondary | ICD-10-CM | POA: Diagnosis not present

## 2020-07-03 DIAGNOSIS — R32 Unspecified urinary incontinence: Secondary | ICD-10-CM | POA: Diagnosis not present

## 2020-07-03 DIAGNOSIS — K219 Gastro-esophageal reflux disease without esophagitis: Secondary | ICD-10-CM | POA: Diagnosis not present

## 2020-07-03 DIAGNOSIS — C7951 Secondary malignant neoplasm of bone: Secondary | ICD-10-CM | POA: Diagnosis not present

## 2020-07-03 DIAGNOSIS — R1310 Dysphagia, unspecified: Secondary | ICD-10-CM | POA: Diagnosis not present

## 2020-07-03 DIAGNOSIS — C9000 Multiple myeloma not having achieved remission: Secondary | ICD-10-CM | POA: Diagnosis not present

## 2020-07-03 DIAGNOSIS — Z0000 Encounter for general adult medical examination without abnormal findings: Secondary | ICD-10-CM | POA: Diagnosis not present

## 2020-07-03 DIAGNOSIS — E039 Hypothyroidism, unspecified: Secondary | ICD-10-CM | POA: Diagnosis not present

## 2020-07-04 ENCOUNTER — Encounter: Payer: Medicare Other | Admitting: Physical Medicine & Rehabilitation

## 2020-07-06 ENCOUNTER — Ambulatory Visit: Payer: Medicare Other

## 2020-07-06 ENCOUNTER — Other Ambulatory Visit: Payer: Self-pay

## 2020-07-06 ENCOUNTER — Ambulatory Visit: Payer: Medicare Other | Attending: Physical Medicine & Rehabilitation

## 2020-07-06 DIAGNOSIS — R296 Repeated falls: Secondary | ICD-10-CM | POA: Diagnosis not present

## 2020-07-06 DIAGNOSIS — R2689 Other abnormalities of gait and mobility: Secondary | ICD-10-CM

## 2020-07-06 DIAGNOSIS — R2681 Unsteadiness on feet: Secondary | ICD-10-CM | POA: Insufficient documentation

## 2020-07-06 DIAGNOSIS — M6281 Muscle weakness (generalized): Secondary | ICD-10-CM | POA: Diagnosis not present

## 2020-07-06 DIAGNOSIS — R1312 Dysphagia, oropharyngeal phase: Secondary | ICD-10-CM | POA: Insufficient documentation

## 2020-07-06 DIAGNOSIS — R293 Abnormal posture: Secondary | ICD-10-CM

## 2020-07-06 NOTE — Therapy (Signed)
[facility] [address] [place], [place], [address] Phone: [phone]   Fax:  [phone]  Physical Therapy Treatment/Re-Certification  Patient Details  Name: Jeremiah Owens MRN: [id number] Date of Birth: Apr 25, 1930 Referring Provider (PT): [name], MD   Encounter Date: 07/06/2020   PT End of Session - 07/06/20 1106    Visit Number 17    Number of Visits 23    Date for PT Re-Evaluation 09/04/20   updated POC for 6 weeks, Cert for 60 days   Authorization Type Medicare    Progress Note Due on Visit 10    PT Start Time 1101    PT Stop Time 1140    PT Time Calculation (min) 39 min    Equipment Utilized During Treatment Gait belt    Activity Tolerance Patient tolerated treatment well    Behavior During Therapy [facility]FL for tasks assessed/performed           Past Medical History:  Diagnosis Date  . Allergy   . Anxiety   . Bone cancer ([place])    t_spine T-5  . Compression fracture   . Encounter for antineoplastic chemotherapy 01/31/2015  . Hearing loss   . Melanoma ([place])   . Multiple myeloma ([place])   . Prostate cancer ([place]) 2002  . S/P radiation therapy 08/21/14-09/04/14   T4-6 25Gy/70f  . S/P radiation therapy 11/29/14-12/12/14   rt prox humerus/shoulder/scapula 25Gy/15f . Skin cancer 1994   melanoma  right neck   . Stroke ([facility]  . Thyroid disease     Past Surgical History:  Procedure Laterality Date  . HERNIA REPAIR    . POSTERIOR CERVICAL FUSION/FORAMINOTOMY N/A 06/17/2014   Procedure: Thoracic five laminectomy for epidural tumor resection Thoracic 4-7 posterior lateral arthrodesis, segmental pedicle screw fixation.;  Surgeon: KyAshok PallMD;  [facility];  Service: Neurosurgery;  Laterality: N/A;    There were no vitals filed for this visit.   Subjective Assessment - 07/06/20 1105    Subjective Patient reports no changes. No falls to report, he almost had a fall pulling off his shirt  but was able to catch his balance. No pain. Does not have to wear compressions stockings any more per MD.    Pertinent History Anxiety, Multiple Myeloma with Thoracic Spinal METS, Stroke (L Basal Ganglia Infarct with R Hemiparesis, Thyroid Disease, Adhesive Capsulitis (L Shoulder), Dysphagia (d/t CVA    Patient Stated Goals get well; get balance back    Currently in Pain? No/denies             [facility]      Assessment   Medical Diagnosis Gait Disorder/Falls    Referring Provider (PT) ZaAlger SimonsMD             [facility] - 07/06/20 1117      Transfers   Transfers Sit to Stand;Stand to Sit    Sit to Stand 5: Supervision    Sit to Stand Details Verbal cues for sequencing;Verbal cues for technique    Sit to Stand Details (indicate cue type and reason) improved forward lean today.     Stand to Sit 5: Supervision    Stand to Sit Details (indicate cue type and reason) Visual cues/gestures for precautions/safety;Visual cues for safe use of DME/AE    Stand to Sit Details verbal cues for reaching back to surface and controlled descent      Ambulation/Gait   Ambulation/Gait Yes  Ambulation/Gait Assistance 5: Supervision;4: Min guard    Ambulation/Gait Assistance Details Completed ambulation on outdoor surfaces with rollator, continue to demo decreased toe clearance on RLE. Supervision throughout and one instances of CGA due to catching R toe on raised lip on concrete. Mild SOB 4/10 at end of completion.     Ambulation Distance (Feet) 400 Feet    Assistive device Rollator    Gait Pattern Step-through pattern;Decreased step length - right;Decreased step length - left;Decreased dorsiflexion - right;Decreased dorsiflexion - left;Right flexed knee in stance;Left flexed knee in stance;Trunk flexed    Ambulation Surface Unlevel;Outdoor    Gait velocity 16.78 secs = 1.95 ft/sec      Timed Up and Go Test   TUG Normal TUG    Normal TUG  (seconds) 21.53      Exercises   Exercises Other Exercises    Other Exercises  Review current HEP to ensure compliance and completion.            Reviewed following HEP:   Access Code: [email or web address] URL: [email or web address] Date: 06/13/2020 Prepared by: Baldomero Lamy  Exercises Sit to Stand with Armchair - 1 x daily - 7 x weekly - 2 sets - 5 reps Seated March with Resistance - 2 x daily - 5 x weekly - 2 sets - 10 reps Seated Scapular Retraction - 2 x daily - 5 x weekly - 2 sets - 10 reps Seated Hamstring Stretch - 3 x daily - 5 x weekly - 1 sets - 3 reps - 30 sec hold Romberg Stance - 1 x daily - 5 x weekly - 1 sets - 3 reps Wide Stance with Head Rotations and Unilateral Counter Support - 1 x daily - 5 x weekly - 1 sets - 10 reps Wide Stance with Head Nods and Unilateral Counter Support - 1 x daily - 5 x weekly - 1 sets - 10 reps Side to Side Weight Shift with Unilateral Counter Support - 1 x daily - 5 x weekly - 1 sets - 10 reps      PT Education - 07/06/20 1335    Education Details HEP review; Updated POC    Person(s) Educated Patient;Spouse    Methods Explanation    Comprehension Verbalized understanding            PT Short Term Goals - 06/06/20 1125      PT SHORT TERM GOAL #1   Title Patient will be independent with initial HEP for strengthening/balance/posture (All STGs Due: 06/04/2020)    Baseline reports complaince/independence    Time 4    Period Weeks    Status Achieved    Target Date 06/04/20      PT SHORT TERM GOAL #2   Title Patient will improve TUG to </= 20 seconds to demonstrate improved mobility and reduced fall risk    Baseline 24.75 secs, 21.9 secs    Time 4    Period Weeks    Status On-going      PT SHORT TERM GOAL #3   Title Patient wil improve Berg Balance to >/= 37/56 to demonstrate reduced risk for falls and improved balance.    Baseline 32/56, 35/56    Time 4    Period Weeks    Status On-going      PT SHORT TERM GOAL  #4   Title Patient will demo ability to ambulate >400 ft with LRAD and supervision level for improved community mobility    Baseline 415 ft supervisoin with rollator  Time 4    Period Weeks    Status Achieved             PT Long Term Goals - 07/06/20 1119      PT LONG TERM GOAL #1   Title Patient will report independence with final HEP for strengthening/balance/posture (ALL LTGs due: 07/02/20)    Baseline patient reports independence; completing every other day currently would like to complete more    Time 8    Period Weeks    Status Partially Met      PT LONG TERM GOAL #2   Title Patient will demo ability to ambulate > 300 ft over outdoor paved surfaces with LRAD and superivison for improved community mobility    Baseline 400 ft with rollator supervision with one instance of CGA    Time 8    Period Weeks    Status Partially Met      PT LONG TERM GOAL #3   Title Pt will perform TUG test in </= 15 sconds to indicate decreased risk of falling    Baseline 24.75 secs; 21.53 secs    Time 8    Period Weeks    Status Not Met      PT LONG TERM GOAL #4   Title Patient will improve Berg Balance to >/= 45/56 to demonstrate improved functional standing balance and reduced fall risk.    Baseline 32/56, 40/56 on 11/29    Time 8    Period Weeks    Status Not Met      PT LONG TERM GOAL #5   Title Patient will improve gait speed to >/= 2.5 ft/sec to demonstrate improved community ambulation    Baseline 1.67 ft/sec, 1.95 ft/sec    Time 8    Period Weeks    Status Not Met          Updated Short Term Goals:   PT Short Term Goals - 07/06/20 1340      PT SHORT TERM GOAL #1   Title Patient will undergo assesment of 5x sit <> stand test and LTG to be set as appropriate    Baseline TBA    Time 3    Period Weeks    Status New    Target Date 07/27/20            Updated Long Term Goals:   PT Long Term Goals - 07/06/20 1341      PT LONG TERM GOAL #1   Title Patient will  report independence with final HEP for strengthening/balance/posture and daily walking program (ALL LTGs due:08/18/19)    Baseline patient reports independence; completing every other day currently would like to complete more    Time 6    Period Weeks    Status Revised    Target Date 08/17/20      PT LONG TERM GOAL #2   Title Patient will demo ability to ambulate > 500 ft over outdoor paved surfaces with LRAD and superivison for improved community mobility    Baseline 400 ft with rollator supervision with one instance of CGA    Time 6    Period Weeks    Status Revised      PT LONG TERM GOAL #3   Title Pt will perform TUG test in </= 18 sconds to indicate decreased risk of falling    Baseline 24.75 secs; 21.53 secs    Time 6    Period Weeks    Status Revised  PT LONG TERM GOAL #4   Title Patient will improve Berg Balance to >/= 45/56 to demonstrate improved functional standing balance and reduced fall risk.    Baseline 32/56, 40/56 on 11/29    Time 6    Period Weeks    Status On-going      PT LONG TERM GOAL #5   Title Patient will improve gait speed to >/= 2.5 ft/sec to demonstrate improved community ambulation    Baseline 1.67 ft/sec, 1.95 ft/sec    Time 6    Period Weeks    Status On-going      Additional Long Term Goals   Additional Long Term Goals Yes      PT LONG TERM GOAL #6   Title LTG to set for 5x sit <> stand test as appropriate    Baseline TBA    Time 6    Period Weeks    Status New               Plan - 07/06/20 1335    Clinical Impression Statement Today's skilled PT session included assessment of patient's progress toward all LTGs. Patient able to partially meet LTG #1 and #2, and demosntrating progress toward all other LTG. Patient has continued to make slow steady progress with PT services. With TUG and Berg Balance score, patient is still at high risk for falls. Patient will benefit from continued skilled PT services to maximize functional  mobility, address impairments, and further reduce risk for falls. Will continue to progress toward all updated/unmet goals.    Personal Factors and Comorbidities Comorbidity 3+;Time since onset of injury/illness/exacerbation;Age    Comorbidities Anxiety, Multiple Myeloma with Thoracic Spinal METS, Stroke (L Basal Ganglia Infarct with R Hemiparesis, Thyroid Disease, Adhesive Capsulitis (L Shoulder), Dysphagia (d/t CVA)    Examination-Activity Limitations Transfers;Stairs;Stand;Locomotion Level;Sit;Bend    Examination-Participation Restrictions Community Activity;Yard Work    [employer]chant [employer] officer Evolving/Moderate complexity    Rehab Potential Good    PT Frequency 2x / week    PT Duration 8 weeks    PT Treatment/Interventions ADLs/Self Care Home Management;Moist Heat;Cryotherapy;Therapeutic activities;DME Instruction;Gait training;Stair training;Functional mobility training;Therapeutic exercise;Balance training;Patient/family education;Orthotic Fit/Training;Neuromuscular re-education;Manual techniques;Passive range of motion;Vestibular    PT Next Visit Plan Continue to progress HEP with seated/standing strengthening. Incorporate more low-level balance exercises. Gait and transfer training.    PT Home Exercise Plan PT to incorporate supine hip strengthening exercises to HEP.    Consulted and Agree with Plan of Care Patient           Patient will benefit from skilled therapeutic intervention in order to improve the following deficits and impairments:  Abnormal gait, Decreased endurance, Decreased balance, Decreased mobility, Difficulty walking, Postural dysfunction, Pain, Decreased strength, Decreased knowledge of use of DME, Decreased activity tolerance, Decreased range of motion  Visit Diagnosis: Abnormal posture  Unsteadiness on feet  Repeated falls  Other abnormalities of gait and mobility  Muscle weakness (generalized)     Problem List Patient Active Problem List    Diagnosis Date Noted  . Multiple myeloma not having achieved remission ([place]) 10/24/2015  . Dysphagia as late effect of stroke 07/10/2015  . Diarrhea 04/25/2015  . Cancer associated pain 04/25/2015  . Long term current use of anticoagulant therapy 04/25/2015  . Basal ganglia infarction ([place]) 02/12/2015  . Dizziness   . Expressive aphasia   . Past pointing   . Right leg weakness   . History of stroke 02/11/2015  . CVA (cerebral infarction) 02/11/2015  . Aspiration  pneumonia ([place]) 02/11/2015  . Aphasia   . Difficulty speaking   . Speech difficult to understand   . Encounter for antineoplastic chemotherapy 01/31/2015  . Constipation 12/20/2014  . Rash 12/20/2014  . Peripheral edema 12/06/2014  . Multifactorial gait disorder 09/05/2014  . Multiple myeloma ([place])   . Paraplegia ([place]) 06/21/2014  . Postoperative anemia due to acute blood loss 06/21/2014  . 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 Spine metastasis ([place]) 06/17/2014  . Thoracic spine tumor 06/17/2014  . Metastatic bone cancer ([place]) 06/15/2014    Jones Bales, PT, DPT 07/06/2020, 1:40 PM  [facility] [address] [place], [place], [address] Phone: [phone]   Fax:  [phone]  Name: Jeremiah Owens MRN: [id number] Date of Birth: [birth year]

## 2020-07-06 NOTE — Therapy (Signed)
[facility] [address] [place], [place], [address] Phone: [phone]   Fax:  [phone]  Speech Language Pathology Treatment  Patient Details  Name: Jeremiah Owens MRN: [id number] Date of Birth: [birth year] Referring Provider (SLP): Jeremiah Simons, MD   Encounter Date: 07/06/2020   End of Session - 07/06/20 1339    Visit Number 7    Number of Visits 17    Date for SLP Re-Evaluation 08/17/20   90 days   SLP Start Time 68    SLP Stop Time  1101    SLP Time Calculation (min) 42 min    Activity Tolerance Patient tolerated treatment well           Past Medical History:  Diagnosis Date  . Allergy   . Anxiety   . Bone cancer ([place])    t_spine T-5  . Compression fracture   . Encounter for antineoplastic chemotherapy 01/31/2015  . Hearing loss   . Melanoma ([place])   . Multiple myeloma ([place])   . Prostate cancer ([place]) 2002  . S/P radiation therapy 08/21/14-09/04/14   T4-6 25Gy/17f  . S/P radiation therapy 11/29/14-12/12/14   rt prox humerus/shoulder/scapula 25Gy/140f . Skin cancer 1994   melanoma  right neck   . Stroke ([facility]  . Thyroid disease     Past Surgical History:  Procedure Laterality Date  . HERNIA REPAIR    . POSTERIOR CERVICAL FUSION/FORAMINOTOMY N/A 06/17/2014   Procedure: Thoracic five laminectomy for epidural tumor resection Thoracic 4-7 posterior lateral arthrodesis, segmental pedicle screw fixation.;  Surgeon: Jeremiah Owens;  [facility];  Service: Neurosurgery;  Laterality: N/A;    There were no vitals filed for this visit.   Subjective Assessment - 07/06/20 1034    Subjective Wife reports that she sees pt able to complete more reps prior to fatigue.    Patient is accompained by: Family member   wife   Currently in Pain? No/denies                 ADULT SLP TREATMENT - 07/06/20 1046      General Information   Behavior/Cognition Hard of hearing;Pleasant  mood;Cooperative;Alert      Treatment Provided   Treatment provided Dysphagia      Dysphagia Treatment   Temperature Spikes Noted No    Respiratory Status Room air    Treatment Methods Skilled observation;Compensation strategy training;Patient/caregiver education    Patient observed directly with PO's Yes    Type of PO's observed Dysphagia 3 (soft);Thin liquids    Liquids provided via Cup    Oral Phase Signs & Symptoms Other (comment)   none noted   Pharyngeal Phase Signs & Symptoms Wet vocal quality;Delayed throat clear    Other treatment/comments Pt again educated on "wet" voice, as he entered with this. Upon SLP education pt cleared this throat strongly and reswallowed - which cleared his voice to WNL quality. Wife req'd SLP min A initialy to cue pt for throat clear and extra swallow. SLP reminded pt/wife rationale for thraot clear and reswallow, using explanation and model of tongue/pharynx. After this explanaition, pt was better with spontaneous throat clear and additional swallow, but wife cont to require to remind pt to incr compliance to >90%. Wife was very good with cues for pt today for his HEP completion. Jeremiah Owens he felt super supraglottic swallow was improved, and SLP opinion was that pt obtained swallow sooner and cough was stronger than in previous  session/s.       Assessment / Recommendations / Plan   Plan Other (Comment)   decr once every other week due to progress     Progression Toward Goals   Progression toward goals Progressing toward goals            SLP Education - 07/06/20 1339    Education Details rationale for throat clear/reswallow    Person(s) Educated Patient    Methods Explanation    Comprehension Verbalized understanding;Need further instruction            SLP Short Term Goals - 06/20/20 1130      SLP SHORT TERM GOAL #1   Title pt will demo HEP for swallowing with occasional min A from SLP or family x 2 sessions    Baseline 06-20-20     Status Partially Met            SLP Long Term Goals - 07/06/20 1342      SLP LONG TERM GOAL #1   Title pt will adequately follow swallow precautions with POs with min A from SLP    Baseline 07-06-20    Period --   or 17 sessions, for all LTGs   Status Achieved      SLP LONG TERM GOAL #2   Title pt will demo HEP for swallowing with occasional min-mod A from SLP or family x 2 sessions    Time 4    Period Weeks    Status Revised      SLP LONG TERM GOAL #3   Title pt will adequately follow swallow precautions with POs with occasional min A from wife in 2 sessions    Time 4    Period Weeks    Status Revised            Plan - 07/06/20 1340    Clinical Impression Statement Jeremiah Owens is curently completing dysphagia HEP for deficit muscle groups with usual min-mod assistance from his wife and cont to perfrom some exercises correctly prior to wife giving him step by step directions.Pt continues to require cues to incr copmliance with swallow precautions to oer 90% - given pt's cognition/memory this may not improve to more than that percentage. See "other comments" for more information. Wife was again modified independent with assisting pt today and pt responding well to wife's cues/assistance so pt will be decr'd to once every other week due to progress. Pt will benefit from skilled ST to assess accuracy of HEP, and his safety with POs, and to cont to educate wife on cueing strategies for pt to complete HEP, and use aspiration precautions.    Speech Therapy Frequency --   once every other week   Duration 8 weeks   or 17 sessoins   Treatment/Interventions Aspiration precaution training;Pharyngeal strengthening exercises;Diet toleration management by SLP;Trials of upgraded texture/liquids;Cueing hierarchy;SLP instruction and feedback;Compensatory strategies;Internal/external aids;Patient/family education    Potential to Achieve Goals Fair    Potential Considerations Previous level of  function;Cooperation/participation level;Severity of impairments    Consulted and Agree with Plan of Care Patient           Patient will benefit from skilled therapeutic intervention in order to improve the following deficits and impairments:   Oropharyngeal dysphagia    Problem List Patient Active Problem List   Diagnosis Date Noted  . Multiple myeloma not having achieved remission ([place]) 10/24/2015  . Dysphagia as late effect of stroke 07/10/2015  . Diarrhea 04/25/2015  . Cancer associated pain  04/25/2015  . Long term current use of anticoagulant therapy 04/25/2015  . Basal ganglia infarction ([place]) 02/12/2015  . Dizziness   . Expressive aphasia   . Past pointing   . Right leg weakness   . History of stroke 02/11/2015  . CVA (cerebral infarction) 02/11/2015  . Aspiration pneumonia ([place]) 02/11/2015  . Aphasia   . Difficulty speaking   . Speech difficult to understand   . Encounter for antineoplastic chemotherapy 01/31/2015  . Constipation 12/20/2014  . Rash 12/20/2014  . Peripheral edema 12/06/2014  . Multifactorial gait disorder 09/05/2014  . Multiple myeloma ([place])   . Paraplegia ([place]) 06/21/2014  . Postoperative anemia due to acute blood loss 06/21/2014  . 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 Spine metastasis ([place]) 06/17/2014  . Thoracic spine tumor 06/17/2014  . Metastatic bone cancer ([place]) 06/15/2014    [facility] ,[place], [place]  07/06/2020, 1:44 PM  [facility] [address] [place], [place], [address] Phone: [phone]   Fax:  [phone]   Name: Jeremiah Owens MRN: [id number] Date of Birth: 07-25-1930

## 2020-07-09 ENCOUNTER — Ambulatory Visit: Payer: Medicare Other

## 2020-07-13 ENCOUNTER — Other Ambulatory Visit: Payer: Self-pay

## 2020-07-13 ENCOUNTER — Ambulatory Visit: Payer: Medicare Other

## 2020-07-13 ENCOUNTER — Ambulatory Visit: Payer: Medicare Other | Admitting: Physical Therapy

## 2020-07-13 DIAGNOSIS — R2689 Other abnormalities of gait and mobility: Secondary | ICD-10-CM | POA: Diagnosis not present

## 2020-07-13 DIAGNOSIS — R296 Repeated falls: Secondary | ICD-10-CM | POA: Diagnosis not present

## 2020-07-13 DIAGNOSIS — R2681 Unsteadiness on feet: Secondary | ICD-10-CM

## 2020-07-13 DIAGNOSIS — M6281 Muscle weakness (generalized): Secondary | ICD-10-CM | POA: Diagnosis not present

## 2020-07-13 DIAGNOSIS — R293 Abnormal posture: Secondary | ICD-10-CM | POA: Diagnosis not present

## 2020-07-13 DIAGNOSIS — R1312 Dysphagia, oropharyngeal phase: Secondary | ICD-10-CM | POA: Diagnosis not present

## 2020-07-13 NOTE — Therapy (Signed)
[facility] [address] [place], [place], [address] Phone: [phone]   Fax:  [phone]  Physical Therapy Treatment  Patient Details  Name: Jeremiah Owens MRN: [id number] Date of Birth: 1929-10-16 Referring Provider (PT): [name], MD   Encounter Date: 07/13/2020   PT End of Session - 07/13/20 1124    Visit Number 18    Number of Visits 23    Date for PT Re-Evaluation 09/04/20   updated POC for 6 weeks, Cert for 60 days   Authorization Type Medicare; KX  07/13/2020    Progress Note Due on Visit 10    PT Start Time 0932    PT Stop Time 1013    PT Time Calculation (min) 41 min    Equipment Utilized During Treatment Gait belt    Activity Tolerance Patient tolerated treatment well    Behavior During Therapy [facility] for tasks assessed/performed           Past Medical History:  Diagnosis Date  . Allergy   . Anxiety   . Bone cancer ([place])    t_spine T-5  . Compression fracture   . Encounter for antineoplastic chemotherapy 01/31/2015  . Hearing loss   . Melanoma ([place])   . Multiple myeloma ([place])   . Prostate cancer ([place]) 2002  . S/P radiation therapy 08/21/14-09/04/14   T4-6 25Gy/73f  . S/P radiation therapy 11/29/14-12/12/14   rt prox humerus/shoulder/scapula 25Gy/118f . Skin cancer 1994   melanoma  right neck   . [place]  . Thyroid disease     Past Surgical History:  Procedure Laterality Date  . HERNIA REPAIR    . POSTERIOR CERVICAL FUSION/FORAMINOTOMY N/A 06/17/2014   Procedure: Thoracic five laminectomy for epidural tumor resection Thoracic 4-7 posterior lateral arthrodesis, segmental pedicle screw fixation.;  Surgeon: KyAshok PallMD;  [facility];  Service: Neurosurgery;  Laterality: N/A;    There were no vitals filed for this visit.   Subjective Assessment - 07/13/20 0936    Subjective Had some near falls, but not any actual falls.    Pertinent History Anxiety, Multiple  Myeloma with Thoracic Spinal METS, Stroke (L Basal Ganglia Infarct with R Hemiparesis, Thyroid Disease, Adhesive Capsulitis (L Shoulder), Dysphagia (d/t CVA    Patient Stated Goals get well; get balance back    Currently in Pain? No/denies                             [facility] 07[facility]13[facility]20 0001      Transfers   Transfers Sit to Stand;Stand to Sit    Sit to Stand 5: Supervision;With upper extremity assist;From bed    Sit to Stand Details Verbal cues for sequencing;Verbal cues for technique    Sit to Stand Details (indicate cue type and reason) Cues for increased forward lean    Five time sit to stand comments  23.25 sec with UE support from mat surface; 27.35 sec from 18" chair with UE support    Stand to Sit 5: Supervision;With upper extremity assist;To chair/3-in-1;To bed    Stand to Sit Details (indicate cue type and reason) Verbal cues for sequencing;Verbal cues for technique    Number of Reps 2 sets;Other reps (comment)   5 reps from elevated mat height   Comments Additional sit<>stand from elevated surface to work on improved functional strengthening.      Knee/Hip Exercises: Standing   Functional  Squat 1 set;10 reps               Balance Exercises - 07/13/20 0001      Balance Exercises: Standing   SLS Eyes open;Solid surface;Upper extremity support 2;3 reps;10 secs   last rep, down to RUE support only   SLS with Vectors Upper extremity assist 2;Solid surface;Limitations    SLS with Vectors Limitations alternating step taps to 6" step x 10 reps, 2 sets; 2nd set using RUE support only    Stepping Strategy Anterior;Posterior;Lateral;UE support    Stepping Strategy Limitations Standing at counter, VCs for increased step length and foot clearance    Step Ups Forward;6 inch;UE support 2;Limitations    Step Ups Limitations Step up, up/down/down x 5 reps each leg leading    Heel Raises Both;10 reps    Toe Raise Both;10 reps    Other  Standing Exercises Stagger stance forward and back weightshifting x 10 reps at counter, for dynamic stretch of gastrocs in rocked back position    Other Standing Exercises Comments Wide BOS lateral weightshift x 10 reps, cues to widen BOS in standing as he tends to stand with narrow BOS.               PT Short Term Goals - 07/13/20 1131      PT SHORT TERM GOAL #1   Title Patient will undergo assesment of 5x sit <> stand test and LTG to be set as appropriate    Baseline 23.25 sec from 24" mat; 27.35 sec from 18" chair; has to use UE support.    Time 3    Period Weeks    Status Achieved    Target Date 07/27/20             PT Long Term Goals - 07/13/20 1131      PT LONG TERM GOAL #1   Title Patient will report independence with final HEP for strengthening/balance/posture and daily walking program (ALL LTGs due:08/18/19)    Baseline patient reports independence; completing every other day currently would like to complete more    Time 6    Period Weeks    Status Revised      PT LONG TERM GOAL #2   Title Patient will demo ability to ambulate > 500 ft over outdoor paved surfaces with LRAD and superivison for improved community mobility    Baseline 400 ft with rollator supervision with one instance of CGA    Time 6    Period Weeks    Status Revised      PT LONG TERM GOAL #3   Title Pt will perform TUG test in </= 18 sconds to indicate decreased risk of falling    Baseline 24.75 secs; 21.53 secs    Time 6    Period Weeks    Status Revised      PT LONG TERM GOAL #4   Title Patient will improve Berg Balance to >/= 45/56 to demonstrate improved functional standing balance and reduced fall risk.    Baseline 32/56, 40/56 on 11/29    Time 6    Period Weeks    Status On-going      PT LONG TERM GOAL #5   Title Patient will improve gait speed to >/= 2.5 ft/sec to demonstrate improved community ambulation    Baseline 1.67 ft/sec, 1.95 ft/sec    Time 6    Period Weeks    Status  On-going      PT LONG TERM  GOAL #6   Title LTG to set for 5x sit <> stand test, to improve to less than or equal to 20 seconds for improved functional strength.    Baseline 27.35 sec from 18" chair with UE support    Time 6    Period Weeks    Status Revised                 Plan - 07/13/20 1128    Clinical Impression Statement Skilled PT session focused on  sit<>stand transfers for improved functional strengthening, as 5x sit<>stand score of 27.35 sec from chair with pt needing UE support indicate decreased functional strength. Normal values for 4-20 year olds are just under 15 seconds.  LTG set as apporpirate.  Remainder of session focused on activities for weightshift and single limb stance, with pt reuqiring UE support for all balance activities.    Personal Factors and Comorbidities Comorbidity 3+;Time since onset of injury/illness/exacerbation;Age    Comorbidities Anxiety, Multiple Myeloma with Thoracic Spinal METS, Stroke (L Basal Ganglia Infarct with R Hemiparesis, Thyroid Disease, Adhesive Capsulitis (L Shoulder), Dysphagia (d/t CVA)    Examination-Activity Limitations Transfers;Stairs;Stand;Locomotion Level;Sit;Bend    Examination-Participation Restrictions Community Activity;Yard Work    [employer] officer Evolving/Moderate complexity    Rehab Potential Good    PT Frequency 2x / week    PT Duration 8 weeks    PT Treatment/Interventions ADLs/Self Care Home Management;Moist Heat;Cryotherapy;Therapeutic activities;DME Instruction;Gait training;Stair training;Functional mobility training;Therapeutic exercise;Balance training;Patient/family education;Orthotic Fit/Training;Neuromuscular re-education;Manual techniques;Passive range of motion;Vestibular    PT Next Visit Plan Continue to progress HEP with seated/standing strengthening. Incorporate more low-level balance exercises. Gait and transfer training.    PT Home Exercise Plan PT to incorporate supine hip  strengthening exercises to HEP.    Consulted and Agree with Plan of Care Patient           Patient will benefit from skilled therapeutic intervention in order to improve the following deficits and impairments:  Abnormal gait,Decreased endurance,Decreased balance,Decreased mobility,Difficulty walking,Postural dysfunction,Pain,Decreased strength,Decreased knowledge of use of DME,Decreased activity tolerance,Decreased range of motion  Visit Diagnosis: Muscle weakness (generalized)  Unsteadiness on feet     Problem List Patient Active Problem List   Diagnosis Date Noted  . Multiple myeloma not having achieved remission ([place]) 10/24/2015  . Dysphagia as late effect of stroke 07/10/2015  . Diarrhea 04/25/2015  . Cancer associated pain 04/25/2015  . Long term current use of anticoagulant therapy 04/25/2015  . Basal ganglia infarction ([place]) 02/12/2015  . Dizziness   . Expressive aphasia   . Past pointing   . Right leg weakness   . History of stroke 02/11/2015  . CVA (cerebral infarction) 02/11/2015  . Aspiration pneumonia ([place]) 02/11/2015  . Aphasia   . Difficulty speaking   . Speech difficult to understand   . Encounter for antineoplastic chemotherapy 01/31/2015  . Constipation 12/20/2014  . Rash 12/20/2014  . Peripheral edema 12/06/2014  . Multifactorial gait disorder 09/05/2014  . Multiple myeloma ([place])   . Paraplegia ([place]) 06/21/2014  . Postoperative anemia due to acute blood loss 06/21/2014  . 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 Spine metastasis ([place]) 06/17/2014  . Thoracic spine tumor 06/17/2014  . Metastatic bone cancer ([place]) 06/15/2014    Jeremiah Ryner W. 07/13/2020, 11:33 AM  Frazier Butt., PT  [facility] [address] [place], [place], [address] Phone: [phone]   Fax:  [phone]  Name: Jeremiah Owens MRN: [id number] Date of Birth: 02-24-1930

## 2020-07-16 ENCOUNTER — Other Ambulatory Visit: Payer: Self-pay

## 2020-07-16 ENCOUNTER — Ambulatory Visit: Payer: Medicare Other

## 2020-07-16 DIAGNOSIS — R2681 Unsteadiness on feet: Secondary | ICD-10-CM | POA: Diagnosis not present

## 2020-07-16 DIAGNOSIS — M6281 Muscle weakness (generalized): Secondary | ICD-10-CM | POA: Diagnosis not present

## 2020-07-16 DIAGNOSIS — R1312 Dysphagia, oropharyngeal phase: Secondary | ICD-10-CM | POA: Diagnosis not present

## 2020-07-16 DIAGNOSIS — R296 Repeated falls: Secondary | ICD-10-CM | POA: Diagnosis not present

## 2020-07-16 DIAGNOSIS — R2689 Other abnormalities of gait and mobility: Secondary | ICD-10-CM | POA: Diagnosis not present

## 2020-07-16 DIAGNOSIS — R293 Abnormal posture: Secondary | ICD-10-CM | POA: Diagnosis not present

## 2020-07-16 NOTE — Therapy (Signed)
[facility] [address] [place], [place], [address] Phone: [phone]   Fax:  [phone]  Speech Language Pathology Treatment  Patient Details  Name: Jeremiah Owens MRN: [id number] Date of Birth: [birth year] Referring Provider (SLP): [name], MD   Encounter Date: 07/16/2020   End of Session - 07/16/20 1713    Visit Number 8    Number of Visits 17    Date for SLP Re-Evaluation 08/17/20   90 days   SLP Start Time 1320    SLP Stop Time  1355    SLP Time Calculation (min) 35 min    Activity Tolerance Patient tolerated treatment well           Past Medical History:  Diagnosis Date  . Allergy   . Anxiety   . Bone cancer ([place])    t_spine T-5  . Compression fracture   . Encounter for antineoplastic chemotherapy 01/31/2015  . Hearing loss   . Melanoma ([place])   . Multiple myeloma ([place])   . Prostate cancer ([place]) 2002  . S/P radiation therapy 08/21/14-09/04/14   T4-6 25Gy/62f  . S/P radiation therapy 11/29/14-12/12/14   rt prox humerus/shoulder/scapula 25Gy/142f . Skin cancer 1994   melanoma  right neck   . Stroke ([facility]  . Thyroid disease     Past Surgical History:  Procedure Laterality Date  . HERNIA REPAIR    . POSTERIOR CERVICAL FUSION/FORAMINOTOMY N/A 06/17/2014   Procedure: Thoracic five laminectomy for epidural tumor resection Thoracic 4-7 posterior lateral arthrodesis, segmental pedicle screw fixation.;  Surgeon: KyAshok PallMD;  [facility];  Service: Neurosurgery;  Laterality: N/A;    There were no vitals filed for this visit.   Subjective Assessment - 07/16/20 1352    Subjective "I think he's coughing a lot less (than prior to ST)." (wife)    Patient is accompained by: Family member   wife   Currently in Pain? No/denies                 ADULT SLP TREATMENT - 07/16/20 1353      General Information   Behavior/Cognition Hard of hearing;Pleasant mood;Cooperative;Alert       Treatment Provided   Treatment provided Dysphagia      Dysphagia Treatment   Temperature Spikes Noted No    Respiratory Status Room air    Treatment Methods Skilled observation;Compensation strategy training;Patient/caregiver education    Patient observed directly with PO's Yes    Type of PO's observed Thin liquids    Liquids provided via Cup    Oral Phase Signs & Symptoms --   none noted   Pharyngeal Phase Signs & Symptoms Immediate throat clear;Wet vocal quality   pt spontaneously cleared wet quality 50% of the time   Other treatment/comments "We are getting through them a little quicker." Pt has recently become unable to perform super-supraglottic with cues, whereas in teh past he accomplished this WNL with cues from wife. SLP assisted wife and suggested pt take sip after bearing down. With 5 reps pt able to perform with cues, with minor letting go of breath occasionally but appeared to re-initiate glottal closure when this occurred. SLP told wife pt can still perform unless he exhales ~50% of air. Wife appropriately montiored this x3 with her cueing for pt, after SLP initial instruction. Told wife/pt to cont this until next session. In 1-2 weeks SLP will assess for readiness for follow up modified (MBSS).  Assessment / Recommendations / Plan   Plan Continue with current plan of care      Progression Toward Goals   Progression toward goals Progressing toward goals            SLP Education - 07/16/20 1712    Education Details take sip after bearing down    Person(s) Educated Patient;Spouse    Methods Explanation;Demonstration;Verbal cues;Handout    Comprehension Verbalized understanding;Returned demonstration;Verbal cues required;Need further instruction            SLP Short Term Goals - 06/20/20 1130      SLP SHORT TERM GOAL #1   Title pt will demo HEP for swallowing with occasional min A from SLP or family x 2 sessions    Baseline 06-20-20    Status Partially Met             SLP Long Term Goals - 07/16/20 1716      SLP LONG TERM GOAL #1   Title pt will adequately follow swallow precautions with POs with min A from SLP    Baseline 07-06-20    Period --   or 17 sessions, for all LTGs   Status Achieved      SLP LONG TERM GOAL #2   Title pt will demo HEP for swallowing with occasional min-mod A from SLP or family x 2 sessions    Time 3    Period Weeks    Status On-going      SLP LONG TERM GOAL #3   Title pt will adequately follow swallow precautions with POs with occasional min A from wife in 2 sessions    Time 3    Period Weeks    Status On-going            Plan - 07/16/20 1713    Clinical Impression Statement [place] is curently completing dysphagia HEP for deficit muscle groups with usual min-mod assistance from his wife and cont to perfrom some exercises correctly prior to wife giving him step by step directions.Pt continues to require cues to incr compliance with swallow precautions however both wife and pt agree he is coughing has significantly decr'd - but due to cognition/memory pt compliance may not improve to more than, currently, 90%. See "other comments" for more information. Wife was again modified independent with assisting pt today and pt responding well to wife's cues/assistance so pt will remain at once every other week due to progress. Pt will benefit from skilled ST to assess accuracy of HEP, and his safety with POs, and to cont to educate wife on cueing strategies for pt to complete HEP, and use aspiration precautions. SLP to assess pt's readiness for f/u modified (MBSS) in next one-two sessions.    Speech Therapy Frequency --   once every other week   Duration 8 weeks   or 17 sessoins   Treatment/Interventions Aspiration precaution training;Pharyngeal strengthening exercises;Diet toleration management by SLP;Trials of upgraded texture/liquids;Cueing hierarchy;SLP instruction and feedback;Compensatory strategies;Internal/external  aids;Patient/family education    Potential to Achieve Goals Fair    Potential Considerations Previous level of function;Cooperation/participation level;Severity of impairments    Consulted and Agree with Plan of Care Patient           Patient will benefit from skilled therapeutic intervention in order to improve the following deficits and impairments:   Oropharyngeal dysphagia    Problem List Patient Active Problem List   Diagnosis Date Noted  . Multiple myeloma not having achieved remission ([place]) 10/24/2015  .  Dysphagia as late effect of stroke 07/10/2015  . Diarrhea 04/25/2015  . Cancer associated pain 04/25/2015  . Long term current use of anticoagulant therapy 04/25/2015  . Basal ganglia infarction ([place]) 02/12/2015  . Dizziness   . Expressive aphasia   . Past pointing   . Right leg weakness   . History of stroke 02/11/2015  . CVA (cerebral infarction) 02/11/2015  . Aspiration pneumonia ([place]) 02/11/2015  . Aphasia   . Difficulty speaking   . Speech difficult to understand   . Encounter for antineoplastic chemotherapy 01/31/2015  . Constipation 12/20/2014  . Rash 12/20/2014  . Peripheral edema 12/06/2014  . Multifactorial gait disorder 09/05/2014  . Multiple myeloma ([place])   . Paraplegia ([place]) 06/21/2014  . Postoperative anemia due to acute blood loss 06/21/2014  . 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 Spine metastasis ([place]) 06/17/2014  . Thoracic spine tumor 06/17/2014  . Metastatic bone cancer ([place]) 06/15/2014    [facility] ,[place], [place]  07/16/2020, 5:17 PM  [facility] [address] [place], [place], [address] Phone: [phone]   Fax:  [phone]   Name: Izael Bessinger MRN: [id number] Date of Birth: [birth year]

## 2020-07-16 NOTE — Patient Instructions (Signed)
  Breathe, bear down, **TAKE SIP**, swallow and cough

## 2020-07-18 ENCOUNTER — Other Ambulatory Visit: Payer: Self-pay

## 2020-07-18 ENCOUNTER — Ambulatory Visit: Payer: Medicare Other

## 2020-07-18 DIAGNOSIS — R2689 Other abnormalities of gait and mobility: Secondary | ICD-10-CM

## 2020-07-18 DIAGNOSIS — R293 Abnormal posture: Secondary | ICD-10-CM | POA: Diagnosis not present

## 2020-07-18 DIAGNOSIS — R2681 Unsteadiness on feet: Secondary | ICD-10-CM | POA: Diagnosis not present

## 2020-07-18 DIAGNOSIS — M6281 Muscle weakness (generalized): Secondary | ICD-10-CM

## 2020-07-18 DIAGNOSIS — R296 Repeated falls: Secondary | ICD-10-CM

## 2020-07-18 DIAGNOSIS — R1312 Dysphagia, oropharyngeal phase: Secondary | ICD-10-CM | POA: Diagnosis not present

## 2020-07-18 NOTE — Therapy (Signed)
[facility] [address] [place], [place], [address] Phone: [phone]   Fax:  [phone]  Physical Therapy Treatment  Patient Details  Name: Jeremiah Owens MRN: [id number] Date of Birth: Jan 07, 1930 Referring Provider (PT): [name], MD   Encounter Date: 07/18/2020   PT End of Session - 07/18/20 1226    Visit Number 19    Number of Visits 23    Date for PT Re-Evaluation 09/04/20   updated POC for 6 weeks, Cert for 60 days   Authorization Type Medicare; KX  07/13/2020    Progress Note Due on Visit 10    PT Start Time 1225    PT Stop Time 1309    PT Time Calculation (min) 44 min    Equipment Utilized During Treatment Gait belt    Activity Tolerance Patient tolerated treatment well    Behavior During Therapy [facility] for tasks assessed/performed           Past Medical History:  Diagnosis Date   Allergy    Anxiety    Bone cancer ([place])    t_spine T-5   Compression fracture    Encounter for antineoplastic chemotherapy 01/31/2015   Hearing loss    Melanoma ([place])    Multiple myeloma ([place])    Prostate cancer ([place]) 2002   S/P radiation therapy 08/21/14-09/04/14   T4-6 25Gy/93f   S/P radiation therapy 11/29/14-12/12/14   rt prox humerus/shoulder/scapula 25Gy/112f  Skin cancer 1994   melanoma  right neck    [facility]   Thyroid disease     Past Surgical History:  Procedure Laterality Date   HERNIA REPAIR     POSTERIOR CERVICAL FUSION/FORAMINOTOMY N/A 06/17/2014   Procedure: Thoracic five laminectomy for epidural tumor resection Thoracic 4-7 posterior lateral arthrodesis, segmental pedicle screw fixation.;  Surgeon: KyAshok PallMD;  [facility];  Service: Neurosurgery;  Laterality: N/A;    There were no vitals filed for this visit.   Subjective Assessment - 07/18/20 1229    Subjective No new changes. No falls to report, reports a couple near falls.    Pertinent History  Anxiety, Multiple Myeloma with Thoracic Spinal METS, Stroke (L Basal Ganglia Infarct with R Hemiparesis, Thyroid Disease, Adhesive Capsulitis (L Shoulder), Dysphagia (d/t CVA    Patient Stated Goals get well; get balance back    Currently in Pain? No/denies               [facility] - 07[facility]18/20 1233      Transfers   Transfers Sit to Stand;Stand to Sit    Sit to Stand 5: Supervision;With upper extremity assist;From bed    Stand to Sit 5: Supervision;With upper extremity assist;To chair/3-in-1;To bed    Comments completed sit <> stand from elevated mat.      Ambulation/Gait   Ambulation/Gait Yes    Ambulation/Gait Assistance 5: Supervision;4: Min guard    Ambulation/Gait Assistance Details completed ambulation x 345 ft with Rollator. Mild SOB after completion 4/10. Continue cues for improved toe clearance and heel toe pattern on RLE. Increased chalelnge and short step length noted with dual task (conversation with ambulation)    Ambulation Distance (Feet) 345 Feet    Assistive device Rollator    Gait Pattern Step-through pattern;Decreased step length - right;Decreased step length - left;Decreased dorsiflexion - right;Decreased dorsiflexion - left;Right flexed knee in stance;Left flexed knee in stance;Trunk flexed    Ambulation Surface Level;Indoor      High Level  Balance   High Level Balance Activities Backward walking;Marching forwards;Side stepping    High Level Balance Comments In // bars with light UE support completed forward marching followed by backwards walking x 3 laps, down and back. Completed side stepping x 3 laps down and back with light UE support. Verbal cues for improved step length when leading with RLE, and avoiding dragging RLE when following. Intermittent rest breaks required.      Self-Care   Self-Care Other Self-Care Comments    Other Self-Care Comments  Patient reporting spot on RLE, patient has area of concern (scab) with spreading redness  in circle around area. Patient reports redness in new. PT educating patient and wife to monitor, as well as reaching out to PCP for assessment,               Balance Exercises - 07/18/20 1247      Balance Exercises: Standing   Standing Eyes Opened Wide ([place]);Foam/compliant surface;2 reps;Limitations    Standing Eyes Opened Limitations completed standing wide BOS 2 x 1 minute each with eyes open. Progressed to completing horiozntal/vertical head turns x 10 reps each. intermittent CGA adn UE support for balance required.    Standing Eyes Closed Wide (BOA);Foam/compliant surface;3 reps;20 secs;Limitations    Standing Eyes Closed Limitations completed 3 x 20-25 seconds, intermittent eye opening during completion and CGA from PT. Increased sway with vision removed.             PT Education - 07/18/20 1314    Education Details See Self Care Section    Person(s) Educated Patient;Spouse    Methods Explanation    Comprehension Verbalized understanding            PT Short Term Goals - 07/13/20 1131      PT SHORT TERM GOAL #1   Title Patient will undergo assesment of 5x sit <> stand test and LTG to be set as appropriate    Baseline 23.25 sec from 24" mat; 27.35 sec from 18" chair; has to use UE support.    Time 3    Period Weeks    Status Achieved    Target Date 07/27/20             PT Long Term Goals - 07/13/20 1131      PT LONG TERM GOAL #1   Title Patient will report independence with final HEP for strengthening/balance/posture and daily walking program (ALL LTGs due:08/18/19)    Baseline patient reports independence; completing every other day currently would like to complete more    Time 6    Period Weeks    Status Revised      PT LONG TERM GOAL #2   Title Patient will demo ability to ambulate > 500 ft over outdoor paved surfaces with LRAD and superivison for improved community mobility    Baseline 400 ft with rollator supervision with one instance of CGA    Time 6     Period Weeks    Status Revised      PT LONG TERM GOAL #3   Title Pt will perform TUG test in </= 18 sconds to indicate decreased risk of falling    Baseline 24.75 secs; 21.53 secs    Time 6    Period Weeks    Status Revised      PT LONG TERM GOAL #4   Title Patient will improve Berg Balance to >/= 45/56 to demonstrate improved functional standing balance and reduced fall risk.  Baseline 32/56, 40/56 on 11/29    Time 6    Period Weeks    Status On-going      PT LONG TERM GOAL #5   Title Patient will improve gait speed to >/= 2.5 ft/sec to demonstrate improved community ambulation    Baseline 1.67 ft/sec, 1.95 ft/sec    Time 6    Period Weeks    Status On-going      PT LONG TERM GOAL #6   Title LTG to set for 5x sit <> stand test, to improve to less than or equal to 20 seconds for improved functional strength.    Baseline 27.35 sec from 18" chair with UE support    Time 6    Period Weeks    Status Revised                 Plan - 07/18/20 1313    Clinical Impression Statement Todays skilled PT session focused on continued balance exercises to promote improved weight shift and activities on complaint surfaces. Increased challenge with head turns and vision removed on complaint surface. PT educating patient and wife on area on RLE and monitoring skin and reaching out to PCP due to concern. Will continue to progress toward all LTGs.    Personal Factors and Comorbidities Comorbidity 3+;Time since onset of injury/illness/exacerbation;Age    Comorbidities Anxiety, Multiple Myeloma with Thoracic Spinal METS, Stroke (L Basal Ganglia Infarct with R Hemiparesis, Thyroid Disease, Adhesive Capsulitis (L Shoulder), Dysphagia (d/t CVA)    Examination-Activity Limitations Transfers;Stairs;Stand;Locomotion Level;Sit;Bend    Examination-Participation Restrictions Community Activity;Yard Work    [employer] Evolving/Moderate complexity    Rehab Potential Good     PT Frequency 2x / week    PT Duration 8 weeks    PT Treatment/Interventions ADLs/Self Care Home Management;Moist Heat;Cryotherapy;Therapeutic activities;DME Instruction;Gait training;Stair training;Functional mobility training;Therapeutic exercise;Balance training;Patient/family education;Orthotic Fit/Training;Neuromuscular re-education;Manual techniques;Passive range of motion;Vestibular    PT Next Visit Plan How is leg? Continue to progress HEP with seated/standing strengthening. Incorporate more low-level balance exercises. Gait and transfer training.    PT Home Exercise Plan PT to incorporate supine hip strengthening exercises to HEP.    Consulted and Agree with Plan of Care Patient           Patient will benefit from skilled therapeutic intervention in order to improve the following deficits and impairments:  Abnormal gait,Decreased endurance,Decreased balance,Decreased mobility,Difficulty walking,Postural dysfunction,Pain,Decreased strength,Decreased knowledge of use of DME,Decreased activity tolerance,Decreased range of motion  Visit Diagnosis: Muscle weakness (generalized)  Unsteadiness on feet  Abnormal posture  Repeated falls  Other abnormalities of gait and mobility     Problem List Patient Active Problem List   Diagnosis Date Noted   Multiple myeloma not having achieved remission ([place]) 10/24/2015   Dysphagia as late effect of stroke 07/10/2015   Diarrhea 04/25/2015   Cancer associated pain 04/25/2015   Long term current use of anticoagulant therapy 04/25/2015   Basal ganglia infarction ([place]) 02/12/2015   Dizziness    Expressive aphasia    Past pointing    Right leg weakness    History of stroke 02/11/2015   CVA (cerebral infarction) 02/11/2015   Aspiration pneumonia ([place]) 02/11/2015   Aphasia    Difficulty speaking    Speech difficult to understand    Encounter for antineoplastic chemotherapy 01/31/2015   Constipation 12/20/2014   Rash  12/20/2014   Peripheral edema 12/06/2014   Multifactorial gait disorder 09/05/2014   Multiple myeloma ([place])    Paraplegia ([place])  06/21/2014   Postoperative anemia due to acute blood loss 06/21/2014   Neoplasm of thoracic spine 06/20/2014   Spine metastasis ([place]) 06/17/2014   Thoracic spine tumor 06/17/2014   Metastatic bone cancer ([place]) 06/15/2014    Jones Bales, PT, DPT 07/18/2020, 1:15 PM  [facility] [address] [place], [place], [address] Phone: [phone]   Fax:  [phone]  Name: Kerrington Greenhalgh MRN: [id number] Date of Birth: [birth year]

## 2020-07-19 DIAGNOSIS — Z85828 Personal history of other malignant neoplasm of skin: Secondary | ICD-10-CM | POA: Diagnosis not present

## 2020-07-19 DIAGNOSIS — D485 Neoplasm of uncertain behavior of skin: Secondary | ICD-10-CM | POA: Diagnosis not present

## 2020-07-19 DIAGNOSIS — L280 Lichen simplex chronicus: Secondary | ICD-10-CM | POA: Diagnosis not present

## 2020-07-19 DIAGNOSIS — L989 Disorder of the skin and subcutaneous tissue, unspecified: Secondary | ICD-10-CM | POA: Diagnosis not present

## 2020-07-19 DIAGNOSIS — L565 Disseminated superficial actinic porokeratosis (DSAP): Secondary | ICD-10-CM | POA: Diagnosis not present

## 2020-07-20 ENCOUNTER — Ambulatory Visit: Payer: Medicare Other

## 2020-07-23 ENCOUNTER — Other Ambulatory Visit: Payer: Self-pay

## 2020-07-23 ENCOUNTER — Ambulatory Visit: Payer: Medicare Other

## 2020-07-23 DIAGNOSIS — M6281 Muscle weakness (generalized): Secondary | ICD-10-CM

## 2020-07-23 DIAGNOSIS — R2689 Other abnormalities of gait and mobility: Secondary | ICD-10-CM

## 2020-07-23 DIAGNOSIS — R2681 Unsteadiness on feet: Secondary | ICD-10-CM

## 2020-07-23 DIAGNOSIS — R1312 Dysphagia, oropharyngeal phase: Secondary | ICD-10-CM | POA: Diagnosis not present

## 2020-07-23 DIAGNOSIS — R293 Abnormal posture: Secondary | ICD-10-CM

## 2020-07-23 DIAGNOSIS — R296 Repeated falls: Secondary | ICD-10-CM

## 2020-07-23 NOTE — Therapy (Signed)
[facility] [address] [place], [place], [address] Phone: [phone]   Fax:  [phone]  Physical Therapy Treatment  Patient Details  Name: Jeremiah Owens MRN: [id number] Date of Birth: Mar 28, 1930 Referring Provider (PT): [name], MD  Physical Therapy Progress Note   Dates of Reporting Period:  06/13/20 - 07/23/20  See Note below for Objective Data and Assessment of Progress/Goals.  Thank you for the referral of this patient. Guill[place], PT, DPT   Encounter Date: 07/23/2020   PT End of Session - 07/23/20 1025    Visit Number 20    Number of Visits 23    Date for PT Re-Evaluation 09/04/20   updated POC for 6 weeks, Cert for 60 days   Authorization Type Medicare; KX  07/13/2020    Progress Note Due on Visit 10    PT Start Time 1017    PT Stop Time 1100    PT Time Calculation (min) 43 min    Equipment Utilized During Treatment Gait belt    Activity Tolerance Patient tolerated treatment well    Behavior During Therapy WFL for tasks assessed/performed           Past Medical History:  Diagnosis Date  . Allergy   . Anxiety   . Bone cancer ([place])    t_spine T-5  . Compression fracture   . Encounter for antineoplastic chemotherapy 01/31/2015  . Hearing loss   . Melanoma ([place])   . Multiple myeloma ([place])   . Prostate cancer ([place]) 2002  . S/P radiation therapy 08/21/14-09/04/14   T4-6 25Gy/63f  . S/P radiation therapy 11/29/14-12/12/14   rt prox humerus/shoulder/scapula 25Gy/150f . Skin cancer 1994   melanoma  right neck   . Stroke ([facility]  . Thyroid disease     Past Surgical History:  Procedure Laterality Date  . HERNIA REPAIR    . POSTERIOR CERVICAL FUSION/FORAMINOTOMY N/A 06/17/2014   Procedure: Thoracic five laminectomy for epidural tumor resection Thoracic 4-7 posterior lateral arthrodesis, segmental pedicle screw fixation.;  Surgeon: KyAshok PallMD;  [facility];  Service:  Neurosurgery;  Laterality: N/A;    There were no vitals filed for this visit.   Subjective Assessment - 07/23/20 1021    Subjective Patient reports went to PCP and they sent him to facility to have biopsy taken of area on RLE. No results from biopsy. No pain. patietn reports that he did have a fall out of chair, reports he slipped out of kitchen chair and fell short distance to floor. No injuries/pain from fall reported.    Pertinent History Anxiety, Multiple Myeloma with Thoracic Spinal METS, Stroke (L Basal Ganglia Infarct with R Hemiparesis, Thyroid Disease, Adhesive Capsulitis (L Shoulder), Dysphagia (d/t CVA    Patient Stated Goals get well; get balance back    Currently in Pain? No/denies                             [facility] 07[facility]20 0001      Transfers   Transfers Sit to Stand;Stand to Sit    Sit to Stand 5: Supervision;With upper extremity assist;From bed    Sit to Stand Details Verbal cues for sequencing;Verbal cues for technique    Five time sit to stand comments  22.19 secs with BUE support    Stand to Sit 5: Supervision;With upper extremity assist;To chair/3-in-1;To bed    Stand to Sit  Details (indicate cue type and reason) Verbal cues for sequencing;Verbal cues for technique    Stand to Sit Details verbal cues to lean forward and control with descent    Comments completed sit <> stand training from mat x 10 reps, working on control with descent. PT also provided verbal cues to lock brakes of rollator prior to sitting.      Ambulation/Gait   Ambulation/Gait Yes    Ambulation/Gait Assistance 5: Supervision    Ambulation/Gait Assistance Details compeltd ambulation throughout therapy gym wtih rolltaor, continue cues for improved step length on RLE    Ambulation Distance (Feet) 200 Feet    Assistive device Rollator    Gait Pattern Step-through pattern;Decreased step length - right;Decreased step length - left;Decreased dorsiflexion -  right;Decreased dorsiflexion - left;Right flexed knee in stance;Left flexed knee in stance;Trunk flexed    Ambulation Surface Level;Indoor    Gait velocity 14.28  secs = 2.29 ft/sec      Neuro Re-ed    Neuro Re-ed Details  Standing at [place] without UE support completed reaching latreal to ragets with RUE 4 x 6 cones, working to promtoe improved limits of stability. Intermittent CGA required. Standing with light UE on rollator completed alternating toe taps to pebbles x 5 reps, progressed to single UE x 5 reps, and then last reps x 10 completed without UE support. CGA from PT required for completion.      Exercises   Exercises Knee/Hip      Knee/Hip Exercises: Aerobic   Other Aerobic Completed SciFit on Level 2.5 x 6 mins with BLE only for improved strengthening and endurance. vebral cues to maintain pace > 40 RPM throughout completion.                    PT Short Term Goals - 07/13/20 1131      PT SHORT TERM GOAL #1   Title Patient will undergo assesment of 5x sit <> stand test and LTG to be set as appropriate    Baseline 23.25 sec from 24" mat; 27.35 sec from 18" chair; has to use UE support.    Time 3    Period Weeks    Status Achieved    Target Date 07/27/20             PT Long Term Goals - 07/13/20 1131      PT LONG TERM GOAL #1   Title Patient will report independence with final HEP for strengthening/balance/posture and daily walking program (ALL LTGs due:08/18/19)    Baseline patient reports independence; completing every other day currently would like to complete more    Time 6    Period Weeks    Status Revised      PT LONG TERM GOAL #2   Title Patient will demo ability to ambulate > 500 ft over outdoor paved surfaces with LRAD and superivison for improved community mobility    Baseline 400 ft with rollator supervision with one instance of CGA    Time 6    Period Weeks    Status Revised      PT LONG TERM GOAL #3   Title Pt will perform TUG test in </= 18  sconds to indicate decreased risk of falling    Baseline 24.75 secs; 21.53 secs    Time 6    Period Weeks    Status Revised      PT LONG TERM GOAL #4   Title Patient will improve Berg Balance to >/= 45/56  to demonstrate improved functional standing balance and reduced fall risk.    Baseline 32/56, 40/56 on 11/29    Time 6    Period Weeks    Status On-going      PT LONG TERM GOAL #5   Title Patient will improve gait speed to >/= 2.5 ft/sec to demonstrate improved community ambulation    Baseline 1.67 ft/sec, 1.95 ft/sec    Time 6    Period Weeks    Status On-going      PT LONG TERM GOAL #6   Title LTG to set for 5x sit <> stand test, to improve to less than or equal to 20 seconds for improved functional strength.    Baseline 27.35 sec from 18" chair with UE support    Time 6    Period Weeks    Status Revised                 Plan - 07/23/20 1214    Clinical Impression Statement Assessed patient 5x sit <> stand and gait speed today for PN. Patient demonstrating improved gait speed during session at 2.29 ft/sec. Patient continues to make progress with PT services. Continued NMR today focused on improved balance without UE support and improved BLE strengthing as tolerated by patient. Will continue per POC.    Personal Factors and Comorbidities Comorbidity 3+;Time since onset of injury/illness/exacerbation;Age    Comorbidities Anxiety, Multiple Myeloma with Thoracic Spinal METS, Stroke (L Basal Ganglia Infarct with R Hemiparesis, Thyroid Disease, Adhesive Capsulitis (L Shoulder), Dysphagia (d/t CVA)    Examination-Activity Limitations Transfers;Stairs;Stand;Locomotion Level;Sit;Bend    Examination-Participation Restrictions Community Activity;Yard Work    [employer] officer Evolving/Moderate complexity    Rehab Potential Good    PT Frequency 2x / week    PT Duration 8 weeks    PT Treatment/Interventions ADLs/Self Care Home Management;Moist  Heat;Cryotherapy;Therapeutic activities;DME Instruction;Gait training;Stair training;Functional mobility training;Therapeutic exercise;Balance training;Patient/family education;Orthotic Fit/Training;Neuromuscular re-education;Manual techniques;Passive range of motion;Vestibular    PT Next Visit Plan Update from MD about biopsy? Continue to progress HEP with seated/standing strengthening. Incorporate more low-level balance exercises. Gait and transfer training.    PT Home Exercise Plan PT to incorporate supine hip strengthening exercises to HEP.    Consulted and Agree with Plan of Care Patient           Patient will benefit from skilled therapeutic intervention in order to improve the following deficits and impairments:  Abnormal gait,Decreased endurance,Decreased balance,Decreased mobility,Difficulty walking,Postural dysfunction,Pain,Decreased strength,Decreased knowledge of use of DME,Decreased activity tolerance,Decreased range of motion  Visit Diagnosis: Muscle weakness (generalized)  Unsteadiness on feet  Abnormal posture  Repeated falls  Other abnormalities of gait and mobility     Problem List Patient Active Problem List   Diagnosis Date Noted  . Multiple myeloma not having achieved remission ([place]) 10/24/2015  . Dysphagia as late effect of stroke 07/10/2015  . Diarrhea 04/25/2015  . Cancer associated pain 04/25/2015  . Long term current use of anticoagulant therapy 04/25/2015  . Basal ganglia infarction ([place]) 02/12/2015  . Dizziness   . Expressive aphasia   . Past pointing   . Right leg weakness   . History of stroke 02/11/2015  . CVA (cerebral infarction) 02/11/2015  . Aspiration pneumonia ([place]) 02/11/2015  . Aphasia   . Difficulty speaking   . Speech difficult to understand   . Encounter for antineoplastic chemotherapy 01/31/2015  . Constipation 12/20/2014  . Rash 12/20/2014  . Peripheral edema 12/06/2014  . Multifactorial gait disorder 09/05/2014  .  Multiple  myeloma ([place])   . Paraplegia ([place]) 06/21/2014  . Postoperative anemia due to acute blood loss 06/21/2014  . 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 Spine metastasis ([place]) 06/17/2014  . Thoracic spine tumor 06/17/2014  . Metastatic bone cancer ([place]) 06/15/2014    Jones Bales, PT, DPT 07/23/2020, 12:16 PM  [facility] [address] [place], [place], [address] Phone: [phone]   Fax:  [phone]  Name: Jeremiah Owens MRN: [id number] Date of Birth: October 24, 1929

## 2020-07-24 ENCOUNTER — Ambulatory Visit: Payer: Medicare Other

## 2020-07-25 ENCOUNTER — Inpatient Hospital Stay: Payer: Medicare Other | Attending: Internal Medicine

## 2020-07-25 ENCOUNTER — Other Ambulatory Visit: Payer: Self-pay

## 2020-07-25 ENCOUNTER — Inpatient Hospital Stay: Payer: Medicare Other

## 2020-07-25 VITALS — BP 107/82 | HR 92 | Temp 98.4°F | Resp 20 | Wt 154.5 lb

## 2020-07-25 DIAGNOSIS — Z8673 Personal history of transient ischemic attack (TIA), and cerebral infarction without residual deficits: Secondary | ICD-10-CM | POA: Diagnosis not present

## 2020-07-25 DIAGNOSIS — Z85828 Personal history of other malignant neoplasm of skin: Secondary | ICD-10-CM | POA: Insufficient documentation

## 2020-07-25 DIAGNOSIS — Z923 Personal history of irradiation: Secondary | ICD-10-CM | POA: Insufficient documentation

## 2020-07-25 DIAGNOSIS — C9000 Multiple myeloma not having achieved remission: Secondary | ICD-10-CM | POA: Diagnosis not present

## 2020-07-25 DIAGNOSIS — R197 Diarrhea, unspecified: Secondary | ICD-10-CM | POA: Diagnosis not present

## 2020-07-25 DIAGNOSIS — Z85820 Personal history of malignant melanoma of skin: Secondary | ICD-10-CM | POA: Diagnosis not present

## 2020-07-25 DIAGNOSIS — R5383 Other fatigue: Secondary | ICD-10-CM | POA: Diagnosis not present

## 2020-07-25 DIAGNOSIS — Z8546 Personal history of malignant neoplasm of prostate: Secondary | ICD-10-CM | POA: Insufficient documentation

## 2020-07-25 DIAGNOSIS — C7951 Secondary malignant neoplasm of bone: Secondary | ICD-10-CM | POA: Insufficient documentation

## 2020-07-25 LAB — CMP (CANCER CENTER ONLY)
ALT: 20 U/L (ref 0–44)
AST: 21 U/L (ref 15–41)
Albumin: 3.7 g/dL (ref 3.5–5.0)
Alkaline Phosphatase: 71 U/L (ref 38–126)
Anion gap: 8 (ref 5–15)
BUN: 22 mg/dL (ref 8–23)
CO2: 29 mmol/L (ref 22–32)
Calcium: 9.4 mg/dL (ref 8.9–10.3)
Chloride: 105 mmol/L (ref 98–111)
Creatinine: 1.06 mg/dL (ref 0.61–1.24)
GFR, Estimated: >60 mL/min (ref >60)
Glucose, Bld: 126 mg/dL — ABNORMAL HIGH (ref 70–99)
Potassium: 4.8 mmol/L (ref 3.5–5.1)
Sodium: 142 mmol/L (ref 135–145)
Total Bilirubin: 0.5 mg/dL (ref 0.3–1.2)
Total Protein: 6.4 g/dL — ABNORMAL LOW (ref 6.5–8.1)

## 2020-07-25 MED ORDER — ZOLEDRONIC ACID 4 MG/100ML IV SOLN
4.0000 mg | Freq: Once | INTRAVENOUS | Status: AC
  Administered 2020-07-25: 13:00:00 4 mg via INTRAVENOUS

## 2020-07-25 MED ORDER — ZOLEDRONIC ACID 4 MG/100ML IV SOLN
INTRAVENOUS | Status: AC
  Filled 2020-07-25: qty 100

## 2020-07-25 NOTE — Patient Instructions (Signed)
Zoledronic Acid injection (Hypercalcemia, Oncology) What is this medicine? ZOLEDRONIC ACID (ZOE le dron ik AS id) lowers the amount of calcium loss from bone. It is used to treat too much calcium in your blood from cancer. It is also used to prevent complications of cancer that has spread to the bone. This medicine may be used for other purposes; ask your health care provider or pharmacist if you have questions. COMMON BRAND NAME(S): Zometa What should I tell my health care provider before I take this medicine? They need to know if you have any of these conditions:  aspirin-sensitive asthma  cancer, especially if you are receiving medicines used to treat cancer  dental disease or wear dentures  infection  kidney disease  receiving corticosteroids like dexamethasone or prednisone  an unusual or allergic reaction to zoledronic acid, other medicines, foods, dyes, or preservatives  pregnant or trying to get pregnant  breast-feeding How should I use this medicine? This medicine is for infusion into a vein. It is given by a health care professional in a hospital or clinic setting. Talk to your pediatrician regarding the use of this medicine in children. Special care may be needed. Overdosage: If you think you have taken too much of this medicine contact a poison control center or emergency room at once. NOTE: This medicine is only for you. Do not share this medicine with others. What if I miss a dose? It is important not to miss your dose. Call your doctor or health care professional if you are unable to keep an appointment. What may interact with this medicine?  certain antibiotics given by injection  NSAIDs, medicines for pain and inflammation, like ibuprofen or naproxen  some diuretics like bumetanide, furosemide  teriparatide  thalidomide This list may not describe all possible interactions. Give your health care provider a list of all the medicines, herbs, non-prescription  drugs, or dietary supplements you use. Also tell them if you smoke, drink alcohol, or use illegal drugs. Some items may interact with your medicine. What should I watch for while using this medicine? Visit your doctor or health care professional for regular checkups. It may be some time before you see the benefit from this medicine. Do not stop taking your medicine unless your doctor tells you to. Your doctor may order blood tests or other tests to see how you are doing. Women should inform their doctor if they wish to become pregnant or think they might be pregnant. There is a potential for serious side effects to an unborn child. Talk to your health care professional or pharmacist for more information. You should make sure that you get enough calcium and vitamin D while you are taking this medicine. Discuss the foods you eat and the vitamins you take with your health care professional. Some people who take this medicine have severe bone, joint, and/or muscle pain. This medicine may also increase your risk for jaw problems or a broken thigh bone. Tell your doctor right away if you have severe pain in your jaw, bones, joints, or muscles. Tell your doctor if you have any pain that does not go away or that gets worse. Tell your dentist and dental surgeon that you are taking this medicine. You should not have major dental surgery while on this medicine. See your dentist to have a dental exam and fix any dental problems before starting this medicine. Take good care of your teeth while on this medicine. Make sure you see your dentist for regular follow-up   appointments. What side effects may I notice from receiving this medicine? Side effects that you should report to your doctor or health care professional as soon as possible:  allergic reactions like skin rash, itching or hives, swelling of the face, lips, or tongue  anxiety, confusion, or depression  breathing problems  changes in vision  eye  pain  feeling faint or lightheaded, falls  jaw pain, especially after dental work  mouth sores  muscle cramps, stiffness, or weakness  redness, blistering, peeling or loosening of the skin, including inside the mouth  trouble passing urine or change in the amount of urine Side effects that usually do not require medical attention (report to your doctor or health care professional if they continue or are bothersome):  bone, joint, or muscle pain  constipation  diarrhea  fever  hair loss  irritation at site where injected  loss of appetite  nausea, vomiting  stomach upset  trouble sleeping  trouble swallowing  weak or tired This list may not describe all possible side effects. Call your doctor for medical advice about side effects. You may report side effects to FDA at 1-800-FDA-1088. Where should I keep my medicine? This drug is given in a hospital or clinic and will not be stored at home. NOTE: This sheet is a summary. It may not cover all possible information. If you have questions about this medicine, talk to your doctor, pharmacist, or health care provider.  2020 Elsevier/Gold Standard (2013-12-17 14:19:39)  

## 2020-07-26 ENCOUNTER — Ambulatory Visit: Payer: Medicare Other

## 2020-07-26 DIAGNOSIS — M25512 Pain in left shoulder: Secondary | ICD-10-CM | POA: Diagnosis not present

## 2020-07-26 DIAGNOSIS — R54 Age-related physical debility: Secondary | ICD-10-CM | POA: Diagnosis not present

## 2020-07-31 ENCOUNTER — Other Ambulatory Visit: Payer: Self-pay

## 2020-07-31 ENCOUNTER — Ambulatory Visit: Payer: Medicare Other

## 2020-07-31 DIAGNOSIS — M6281 Muscle weakness (generalized): Secondary | ICD-10-CM | POA: Diagnosis not present

## 2020-07-31 DIAGNOSIS — R293 Abnormal posture: Secondary | ICD-10-CM | POA: Diagnosis not present

## 2020-07-31 DIAGNOSIS — R2681 Unsteadiness on feet: Secondary | ICD-10-CM | POA: Diagnosis not present

## 2020-07-31 DIAGNOSIS — R1312 Dysphagia, oropharyngeal phase: Secondary | ICD-10-CM

## 2020-07-31 DIAGNOSIS — R2689 Other abnormalities of gait and mobility: Secondary | ICD-10-CM | POA: Diagnosis not present

## 2020-07-31 DIAGNOSIS — R296 Repeated falls: Secondary | ICD-10-CM | POA: Diagnosis not present

## 2020-07-31 NOTE — Therapy (Signed)
[facility] [address] [place], [place], [address] Phone: [phone]   Fax:  [phone]  Speech Language Pathology Treatment  Patient Details  Name: Jeremiah Owens MRN: [id number] Date of Birth: 10-11-1929 Referring Provider (SLP): [name], MD   Encounter Date: 07/31/2020   End of Session - 07/31/20 1342    Visit Number 9    Number of Visits 17    Date for SLP Re-Evaluation 08/17/20           Past Medical History:  Diagnosis Date  . Allergy   . Anxiety   . Bone cancer ([place])    t_spine T-5  . Compression fracture   . Encounter for antineoplastic chemotherapy 01/31/2015  . Hearing loss   . Melanoma ([place])   . Multiple myeloma ([place])   . Prostate cancer ([place]) 2002  . S/P radiation therapy 08/21/14-09/04/14   T4-6 25Gy/20f  . S/P radiation therapy 11/29/14-12/12/14   rt prox humerus/shoulder/scapula 25Gy/134f . Skin cancer 1994   melanoma  right neck   . [place] ([facility]  . Thyroid disease     Past Surgical History:  Procedure Laterality Date  . HERNIA REPAIR    . POSTERIOR CERVICAL FUSION/FORAMINOTOMY N/A 06/17/2014   Procedure: Thoracic five laminectomy for epidural tumor resection Thoracic 4-7 posterior lateral arthrodesis, segmental pedicle screw fixation.;  Surgeon: KyAshok PallMD;  [facility];  Service: Neurosurgery;  Laterality: N/A;    There were no vitals filed for this visit.   Subjective Assessment - 07/31/20 0947    Subjective Pt arrived with hydrophonic voice.    Patient is accompained by: --   wife   Currently in Pain? No/denies                 ADULT SLP TREATMENT - 07/31/20 0948      General Information   Behavior/Cognition Hard of hearing;Pleasant mood;Cooperative;Alert      Treatment Provided   Treatment provided Dysphagia      Dysphagia Treatment   Temperature Spikes Noted No    Respiratory Status Room air    Treatment Methods Skilled  observation;Compensation strategy training;Patient/caregiver education    Patient observed directly with PO's Yes    Type of PO's observed Thin liquids;Dysphagia 3 (soft)    Liquids provided via Cup    Oral Phase Signs & Symptoms Other (comment)   none noted today   Pharyngeal Phase Signs & Symptoms Immediate throat clear;Wet vocal quality;Audible swallow   once pt was cued to clear/reswallow and did so, wet voice eliminated   Other treatment/comments "We aren't doing that at home." (pt, re: precautions of clearing throat and reswallow). SLP had to cue pt's wife for throat clear and reswallow. Pt questioning why he has to perform precautions so SLP reviewed rationale. Pt has had tomato sandwich, baked salmon, sweet potatoes, salad. "I think just very minimal coughing," wife stated. "That part has improved."      Assessment / Recommendations / Plan   Plan Continue with current plan of care;Other (Comment)   request MBSS order from [facility]   Progression Toward Goals   Progression toward goals Progressing toward goals            SLP Education - 07/31/20 1340    Education Details need to clear throat and swallow for every bite/sip    Person(s) Educated Patient;Spouse    Methods Explanation;Handout    Comprehension Verbalized understanding  SLP Short Term Goals - 06/20/20 1130      SLP SHORT TERM GOAL #1   Title pt will demo HEP for swallowing with occasional min A from SLP or family x 2 sessions    Baseline 06-20-20    Status Partially Met            SLP Long Term Goals - 07/31/20 1346      SLP LONG TERM GOAL #1   Title pt will adequately follow swallow precautions with POs with min A from SLP    Baseline 07-06-20    Period --   or 17 sessions, for all LTGs   Status Achieved      SLP LONG TERM GOAL #2   Title pt will demo HEP for swallowing with occasional min-mod A from SLP or family x 2 sessions    Baseline 07-31-20    Time 2    Period Weeks    Status  On-going      SLP LONG TERM GOAL #3   Title pt will adequately follow swallow precautions with POs with occasional min A from wife in 2 sessions    Baseline 07-31-20    Time 2    Period Weeks    Status On-going            Plan - 07/31/20 1344    Clinical Impression Statement SLP believes [place] is ready for repeat modified (MBSS) - SLP to contact MD to request order. [place] is curently completing dysphagia HEP for deficit muscle groups with usual min-mod assistance from his wife and cont to perfrom some exercises correctly prior to wife giving him step by step directions. Pt continues to require cues to incr compliance with swallow precautions however both wife and pt agree he is coughing has significantly decr'd. SLP told wife today pt compliance with precautions may largely rest on her due to pt's decr'd memory.See "other comments" for more information. Wife was again modified independent with assisting pt today and pt responding well to wife's cues/assistance so pt will remain at once every other week due to progress. Pt will benefit from skilled ST to assess accuracy of HEP, and his safety with POs, and to cont to educate wife on cueing strategies for pt to complete HEP, and use aspiration precautions. SLP to assess pt's readiness for f/u modified (MBSS) in next one-two sessions.    Speech Therapy Frequency --   once every other week   Duration 8 weeks   or 17 sessoins   Treatment/Interventions Aspiration precaution training;Pharyngeal strengthening exercises;Diet toleration management by SLP;Trials of upgraded texture/liquids;Cueing hierarchy;SLP instruction and feedback;Compensatory strategies;Internal/external aids;Patient/family education    Potential to Achieve Goals Fair    Potential Considerations Previous level of function;Cooperation/participation level;Severity of impairments    Consulted and Agree with Plan of Care Patient           Patient will benefit from skilled therapeutic  intervention in order to improve the following deficits and impairments:   Oropharyngeal dysphagia    Problem List Patient Active Problem List   Diagnosis Date Noted  . Multiple myeloma not having achieved remission ([place]) 10/24/2015  . Dysphagia as late effect of stroke 07/10/2015  . Diarrhea 04/25/2015  . Cancer associated pain 04/25/2015  . Long term current use of anticoagulant therapy 04/25/2015  . Basal ganglia infarction ([place]) 02/12/2015  . Dizziness   . Expressive aphasia   . Past pointing   . Right leg weakness   . History of stroke 02/11/2015  .  CVA (cerebral infarction) 02/11/2015  . Aspiration pneumonia ([place]) 02/11/2015  . Aphasia   . Difficulty speaking   . Speech difficult to understand   . Encounter for antineoplastic chemotherapy 01/31/2015  . Constipation 12/20/2014  . Rash 12/20/2014  . Peripheral edema 12/06/2014  . Multifactorial gait disorder 09/05/2014  . Multiple myeloma ([place])   . Paraplegia ([place]) 06/21/2014  . Postoperative anemia due to acute blood loss 06/21/2014  . 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 Spine metastasis ([place]) 06/17/2014  . Thoracic spine tumor 06/17/2014  . Metastatic bone cancer ([place]) 06/15/2014    [facility] ,[place], [place]  07/31/2020, 1:47 PM  [facility] [address] [place], [place], [address] Phone: [phone]   Fax:  [phone]   Name: Iori Gigante MRN: [id number] Date of Birth: 1929/08/20

## 2020-08-01 ENCOUNTER — Ambulatory Visit: Payer: Medicare Other

## 2020-08-01 DIAGNOSIS — R296 Repeated falls: Secondary | ICD-10-CM

## 2020-08-01 DIAGNOSIS — M6281 Muscle weakness (generalized): Secondary | ICD-10-CM | POA: Diagnosis not present

## 2020-08-01 DIAGNOSIS — R2689 Other abnormalities of gait and mobility: Secondary | ICD-10-CM | POA: Diagnosis not present

## 2020-08-01 DIAGNOSIS — E039 Hypothyroidism, unspecified: Secondary | ICD-10-CM | POA: Diagnosis not present

## 2020-08-01 DIAGNOSIS — R2681 Unsteadiness on feet: Secondary | ICD-10-CM | POA: Diagnosis not present

## 2020-08-01 DIAGNOSIS — R293 Abnormal posture: Secondary | ICD-10-CM

## 2020-08-01 DIAGNOSIS — R1312 Dysphagia, oropharyngeal phase: Secondary | ICD-10-CM | POA: Diagnosis not present

## 2020-08-01 NOTE — Therapy (Signed)
[facility] [address] [place], [place], [address] Phone: [phone]   Fax:  [phone]  Physical Therapy Treatment  Patient Details  Name: Jeremiah Owens MRN: [id number] Date of Birth: 06-22-1930 Referring Provider (PT): Jeremiah Simons, MD   Encounter Date: 08/01/2020   PT End of Session - 08/01/20 1044    Visit Number 21    Number of Visits 23    Date for PT Re-Evaluation 09/04/20   updated POC for 6 weeks, Cert for 60 days   Authorization Type Medicare; KX  07/13/2020    Progress Note Due on Visit 10    PT Start Time 8022    PT Stop Time 1130    PT Time Calculation (min) 46 min    Equipment Utilized During Treatment Gait belt    Activity Tolerance Patient tolerated treatment well    Behavior During Therapy [facility] for tasks assessed/performed           Past Medical History:  Diagnosis Date   Allergy    Anxiety    Bone cancer ([place])    t_spine T-5   Compression fracture    Encounter for antineoplastic chemotherapy 01/31/2015   Hearing loss    Melanoma ([place])    Multiple myeloma ([place])    Prostate cancer ([place]) 2002   S/P radiation therapy 08/21/14-09/04/14   T4-6 25Gy/58f   S/P radiation therapy 11/29/14-12/12/14   rt prox humerus/shoulder/scapula 25Gy/180f  Skin cancer 1994   melanoma  right neck    Stroke ([facility]   Thyroid disease     Past Surgical History:  Procedure Laterality Date   HERNIA REPAIR     POSTERIOR CERVICAL FUSION/FORAMINOTOMY N/A 06/17/2014   Procedure: Thoracic five laminectomy for epidural tumor resection Thoracic 4-7 posterior lateral arthrodesis, segmental pedicle screw fixation.;  Surgeon: KyAshok PallMD;  Location: [facility];  Service: Neurosurgery;  Laterality: N/A;    There were no vitals filed for this visit.   Subjective Assessment - 08/01/20 1055    Subjective Biopsy come back for leg normal, no concerns. Patient did sustain a fall on 12/23. Patient  has bruising over the L ribs and sternum, but reports pain in the shoulder. Patient had x-ray but come back normal.    Pertinent History Anxiety, Multiple Myeloma with Thoracic Spinal METS, Stroke (L Basal Ganglia Infarct with R Hemiparesis, Thyroid Disease, Adhesive Capsulitis (L Shoulder), Dysphagia (d/t CVA    Patient Stated Goals get well; get balance back    Currently in Pain? Yes    Pain Score 8    with movement overhead   Pain Location Shoulder    Pain Orientation Left    Pain Descriptors / Indicators Aching    Pain Type Acute pain    Pain Onset In the past 7 days    Pain Frequency Intermittent    Aggravating Factors  movement    Pain Relieving Factors rest                [facility] Adult PT Treatment/Exercise - 08/01/20 0001      Transfers   Transfers Sit to Stand;Stand to Sit    Sit to Stand 5: Supervision;With upper extremity assist;From bed    Stand to Sit 5: Supervision;With upper extremity assist;To chair/3-in-1;To bed    Stand to Sit Details (indicate cue type and reason) Verbal cues for sequencing;Verbal cues for technique      Ambulation/Gait   Ambulation/Gait Yes    Ambulation/Gait Assistance 5: Supervision  Ambulation/Gait Assistance Details compelted ambulation with rollator, continue to demo decreased toe clearance. Incorporated sudden stops, backwards walking short distance < 5 ft, and full turns to R/L with ambulation. Increased challenge with turning to L due to often leaving RLE behind, requiring verbal cues for improvement. Patient also require CGA for backwards walking due to imbalance.    Ambulation Distance (Feet) 250 Feet    Assistive device Rollator    Gait Pattern Step-through pattern;Decreased step length - right;Decreased step length - left;Decreased dorsiflexion - right;Decreased dorsiflexion - left;Right flexed knee in stance;Left flexed knee in stance;Trunk flexed    Ambulation Surface Level;Indoor      Self-Care   Self-Care Other Self-Care  Comments    Other Self-Care Comments  Patient and wife reporting that biopsy come back for placed on RLE, no abnormal findings noted. Patient did have a significant fall on 12/23 where he hit his L torso and L shoulder. Patient did have x-rays completed with no abnormalities noted. PT assesed area, significant bruising noted along strenum and L ribs, no tenderness/pain to palpation. Patient reports increased pain in L shoulder with movement, no pain/tenderness to palpation. Patient demonstrating decreased ROM in L shoulder however actively. When completed passively able to tolerate better. Increased pain with movement, PT educating on potential for acute pain to be affecting ROM. Will continue to monitor over next 4-5 days and see if any improvements are noted. PT did educate on need for purchasing a gait belt to allow for wife to use to help patient off the floor with a fall. PT provided handout with information of potential gait belt option.      Neuro Re-ed    Neuro Re-ed Details  Standing at RW: completed alternating toe taps to 4" step startingwith BUE x 5 reps B, then single UE x 5 reps B, and then followed by 10 reps without UE support, CGA required and one instance of Min A for balance with completion. At countertop with single UE support (RUE), completed stepping strategy working toward improved step in ant/posterior direction and lateral direction, working toward imporved weight shift as well. Completed alternating x 10 reps bilaterally in each direction stated above. Continue to demo increased challenge with step length of RLE > LLE. PT providing visual target to step to, with improvements noted. Verbal cues for posture provided throughout, and manual faciliation for improved weight shift                  PT Education - 08/01/20 1140    Education Details Clinical cytogeneticist for [employer]) Educated Patient;Spouse    Methods Explanation;Handout    Comprehension Verbalized  understanding            PT Short Term Goals - 07/13/20 1131      PT SHORT TERM GOAL #1   Title Patient will undergo assesment of 5x sit <> stand test and LTG to be set as appropriate    Baseline 23.25 sec from 24" mat; 27.35 sec from 18" chair; has to use UE support.    Time 3    Period Weeks    Status Achieved    Target Date 07/27/20             PT Long Term Goals - 07/13/20 1131      PT LONG TERM GOAL #1   Title Patient will report independence with final HEP for strengthening/balance/posture and daily walking program (ALL LTGs due:08/18/19)    Baseline patient  reports independence; completing every other day currently would like to complete more    Time 6    Period Weeks    Status Revised      PT LONG TERM GOAL #2   Title Patient will demo ability to ambulate > 500 ft over outdoor paved surfaces with LRAD and superivison for improved community mobility    Baseline 400 ft with rollator supervision with one instance of CGA    Time 6    Period Weeks    Status Revised      PT LONG TERM GOAL #3   Title Pt will perform TUG test in </= 18 sconds to indicate decreased risk of falling    Baseline 24.75 secs; 21.53 secs    Time 6    Period Weeks    Status Revised      PT LONG TERM GOAL #4   Title Patient will improve Berg Balance to >/= 45/56 to demonstrate improved functional standing balance and reduced fall risk.    Baseline 32/56, 40/56 on 11/29    Time 6    Period Weeks    Status On-going      PT LONG TERM GOAL #5   Title Patient will improve gait speed to >/= 2.5 ft/sec to demonstrate improved community ambulation    Baseline 1.67 ft/sec, 1.95 ft/sec    Time 6    Period Weeks    Status On-going      PT LONG TERM GOAL #6   Title LTG to set for 5x sit <> stand test, to improve to less than or equal to 20 seconds for improved functional strength.    Baseline 27.35 sec from 18" chair with UE support    Time 6    Period Weeks    Status Revised                  Plan - 08/01/20 1157    Clinical Impression Statement Patient sustained a fall few days prior, PT assessing area due to continued pain and decreased ROM. Decreased motion seemed to be due to increased pain/discomfort, but will continue to monitor. Educated on need for gait belt and handout provided. Spent rest of session completing activites to promote improved weight shift and stepping strategies. Intermittent rest breaks required. Will continue per POC.    Personal Factors and Comorbidities Comorbidity 3+;Time since onset of injury/illness/exacerbation;Age    Comorbidities Anxiety, Multiple Myeloma with Thoracic Spinal METS, Stroke (L Basal Ganglia Infarct with R Hemiparesis, Thyroid Disease, Adhesive Capsulitis (L Shoulder), Dysphagia (d/t CVA)    Examination-Activity Limitations Transfers;Stairs;Stand;Locomotion Level;Sit;Bend    Examination-Participation Restrictions Community Activity;Yard Work    Merchant navy officer Evolving/Moderate complexity    Rehab Potential Good    PT Frequency 2x / week    PT Duration 8 weeks    PT Treatment/Interventions ADLs/Self Care Home Management;Moist Heat;Cryotherapy;Therapeutic activities;DME Instruction;Gait training;Stair training;Functional mobility training;Therapeutic exercise;Balance training;Patient/family education;Orthotic Fit/Training;Neuromuscular re-education;Manual techniques;Passive range of motion;Vestibular    PT Next Visit Plan How is shoulder feeling?  Continue to progress HEP with seated/standing strengthening. Incorporate more low-level balance exercises. Gait and transfer training.    Consulted and Agree with Plan of Care Patient           Patient will benefit from skilled therapeutic intervention in order to improve the following deficits and impairments:  Abnormal gait,Decreased endurance,Decreased balance,Decreased mobility,Difficulty walking,Postural dysfunction,Pain,Decreased strength,Decreased  knowledge of use of DME,Decreased activity tolerance,Decreased range of motion  Visit Diagnosis: Other abnormalities of gait and  mobility  Muscle weakness (generalized)  Repeated falls  Unsteadiness on feet  Abnormal posture     Problem List Patient Active Problem List   Diagnosis Date Noted   Multiple myeloma not having achieved remission ([place]) 10/24/2015   Dysphagia as late effect of stroke 07/10/2015   Diarrhea 04/25/2015   Cancer associated pain 04/25/2015   Long term current use of anticoagulant therapy 04/25/2015   Basal ganglia infarction ([place]) 02/12/2015   Dizziness    Expressive aphasia    Past pointing    Right leg weakness    History of stroke 02/11/2015   CVA (cerebral infarction) 02/11/2015   Aspiration pneumonia ([place]) 02/11/2015   Aphasia    Difficulty speaking    Speech difficult to understand    Encounter for antineoplastic chemotherapy 01/31/2015   Constipation 12/20/2014   Rash 12/20/2014   Peripheral edema 12/06/2014   Multifactorial gait disorder 09/05/2014   Multiple myeloma ([place])    Paraplegia ([place]) 06/21/2014   Postoperative anemia due to acute blood loss 06/21/2014   Neoplasm of thoracic spine 06/20/2014   Spine metastasis ([place]) 06/17/2014   Thoracic spine tumor 06/17/2014   Metastatic bone cancer ([place]) 06/15/2014    Jones Bales, PT, DPT 08/01/2020, 12:01 PM  [facility] [address] [place], [place], [address] Phone: [phone]   Fax:  [phone]  Name: Jeremiah Owens MRN: [id number] Date of Birth: June 01, 1930

## 2020-08-06 ENCOUNTER — Ambulatory Visit: Payer: Medicare Other | Attending: Physical Medicine & Rehabilitation

## 2020-08-06 ENCOUNTER — Other Ambulatory Visit: Payer: Self-pay

## 2020-08-06 DIAGNOSIS — R1312 Dysphagia, oropharyngeal phase: Secondary | ICD-10-CM | POA: Diagnosis not present

## 2020-08-06 DIAGNOSIS — M25612 Stiffness of left shoulder, not elsewhere classified: Secondary | ICD-10-CM | POA: Diagnosis not present

## 2020-08-06 DIAGNOSIS — R296 Repeated falls: Secondary | ICD-10-CM | POA: Diagnosis not present

## 2020-08-06 DIAGNOSIS — R293 Abnormal posture: Secondary | ICD-10-CM | POA: Diagnosis not present

## 2020-08-06 DIAGNOSIS — R2689 Other abnormalities of gait and mobility: Secondary | ICD-10-CM | POA: Insufficient documentation

## 2020-08-06 DIAGNOSIS — M25512 Pain in left shoulder: Secondary | ICD-10-CM | POA: Insufficient documentation

## 2020-08-06 DIAGNOSIS — R2681 Unsteadiness on feet: Secondary | ICD-10-CM | POA: Diagnosis not present

## 2020-08-06 DIAGNOSIS — M6281 Muscle weakness (generalized): Secondary | ICD-10-CM | POA: Diagnosis not present

## 2020-08-06 NOTE — Therapy (Signed)
[facility] [address] [place], [place], [address] Phone: [phone]   Fax:  [phone]  Physical Therapy Treatment  Patient Details  Name: Jeremiah Owens MRN: [id number] Date of Birth: 1929/09/27 Referring Provider (PT): [name], MD   Encounter Date: 08/06/2020   PT End of Session - 08/06/20 1105    Visit Number 22    Number of Visits 23    Date for PT Re-Evaluation 09/04/20   updated POC for 6 weeks, Cert for 60 days   Authorization Type Medicare; KX  07/13/2020    Progress Note Due on Visit 10    PT Start Time 1102    PT Stop Time 1145    PT Time Calculation (min) 43 min    Equipment Utilized During Treatment Gait belt    Activity Tolerance Patient tolerated treatment well    Behavior During Therapy WFL for tasks assessed/performed           Past Medical History:  Diagnosis Date  . Allergy   . Anxiety   . Bone cancer ([place])    t_spine T-5  . Compression fracture   . Encounter for antineoplastic chemotherapy 01/31/2015  . Hearing loss   . Melanoma ([place])   . Multiple myeloma ([place])   . Prostate cancer ([place]) 2002  . S/P radiation therapy 08/21/14-09/04/14   T4-6 25Gy/76f  . S/P radiation therapy 11/29/14-12/12/14   rt prox humerus/shoulder/scapula 25Gy/133f . Skin cancer 1994   melanoma  right neck   . Stroke ([place]  . Thyroid disease     Past Surgical History:  Procedure Laterality Date  . HERNIA REPAIR    . POSTERIOR CERVICAL FUSION/FORAMINOTOMY N/A 06/17/2014   Procedure: Thoracic five laminectomy for epidural tumor resection Thoracic 4-7 posterior lateral arthrodesis, segmental pedicle screw fixation.;  Surgeon: KyAshok PallMD;  [facility];  Service: Neurosurgery;  Laterality: N/A;    There were no vitals filed for this visit.   Subjective Assessment - 08/06/20 1105    Subjective Patient reports still some soreness/pain in the L shoulder. No other new falls. No new changes.  Did have couple of stumbles.    Pertinent History Anxiety, Multiple Myeloma with Thoracic Spinal METS, Stroke (L Basal Ganglia Infarct with R Hemiparesis, Thyroid Disease, Adhesive Capsulitis (L Shoulder), Dysphagia (d/t CVA    Patient Stated Goals get well; get balance back    Currently in Pain? No/denies    Pain Onset In the past 7 days                             [facility]      Transfers   Transfers Sit to Stand;Stand to Sit    Sit to Stand 5: Supervision;With upper extremity assist;From bed    Stand to Sit 5: Supervision;With upper extremity assist;To chair/3-in-1;To bed    Stand to Sit Details (indicate cue type and reason) Verbal cues for sequencing;Verbal cues for technique    Comments verbal cues for improved forward lean required throughout session with completion of sit <> stand      Ambulation/Gait   Ambulation/Gait Yes    Ambulation/Gait Assistance 5: Supervision    Ambulation/Gait Assistance Details completed ambulation with rollator x 200 ft with focus on improved step length and toe clearance on RLE.    Ambulation Distance (Feet) 200 Feet    Assistive device Rollator    Gait  Pattern Step-through pattern;Decreased step length - right;Decreased step length - left;Decreased dorsiflexion - right;Decreased dorsiflexion - left;Right flexed knee in stance;Left flexed knee in stance;Trunk flexed    Ambulation Surface Level;Indoor      Neuro Re-ed    Neuro Re-ed Details  In // bars: Completed standing on airex completed reaching for target in various direction with RUE x 15 reps each direction. Increased challenge with targets to L side. Intermittent CGA and UE uspport required to maintain balance. Completed standing with wide BOS on airex, completed horizontal head and trunk turns to R/L simulating looking over shoulder x 10 reps each direction. On Blue Mat: with wide BOS PT providing manual perturbation anterior/posterior x  10 reps each direction. increased challenge with posterior perturbation. With staggered stance completed anterior/posterior weight shift x 10 reps working toward improved limits of stabilit, intermittent UE support required. completed initially on firm surface and progressed to complaint surface (blue mat). Intermittent rest breaks required. O2Sat: 97-98%, HR: 90-93 with completion of activities.      Knee/Hip Exercises: Aerobic   Other Aerobic Completed SciFit on Level 2.5 x 5 minutes with BLE only for improved strengthening/reciprocal movement.                    PT Short Term Goals - 07/13/20 1131      PT SHORT TERM GOAL #1   Title Patient will undergo assesment of 5x sit <> stand test and LTG to be set as appropriate    Baseline 23.25 sec from 24" mat; 27.35 sec from 18" chair; has to use UE support.    Time 3    Period Weeks    Status Achieved    Target Date 07/27/20             PT Long Term Goals - 07/13/20 1131      PT LONG TERM GOAL #1   Title Patient will report independence with final HEP for strengthening/balance/posture and daily walking program (ALL LTGs due:08/18/19)    Baseline patient reports independence; completing every other day currently would like to complete more    Time 6    Period Weeks    Status Revised      PT LONG TERM GOAL #2   Title Patient will demo ability to ambulate > 500 ft over outdoor paved surfaces with LRAD and superivison for improved community mobility    Baseline 400 ft with rollator supervision with one instance of CGA    Time 6    Period Weeks    Status Revised      PT LONG TERM GOAL #3   Title Pt will perform TUG test in </= 18 sconds to indicate decreased risk of falling    Baseline 24.75 secs; 21.53 secs    Time 6    Period Weeks    Status Revised      PT LONG TERM GOAL #4   Title Patient will improve Berg Balance to >/= 45/56 to demonstrate improved functional standing balance and reduced fall risk.    Baseline  32/56, 40/56 on 11/29    Time 6    Period Weeks    Status On-going      PT LONG TERM GOAL #5   Title Patient will improve gait speed to >/= 2.5 ft/sec to demonstrate improved community ambulation    Baseline 1.67 ft/sec, 1.95 ft/sec    Time 6    Period Weeks    Status On-going  PT LONG TERM GOAL #6   Title LTG to set for 5x sit <> stand test, to improve to less than or equal to 20 seconds for improved functional strength.    Baseline 27.35 sec from 18" chair with UE support    Time 6    Period Weeks    Status Revised                 Plan - 08/06/20 1211    Clinical Impression Statement Continued NMR activites in // bars working toward improved limits of stability and balance on complaint surfaces. Intermittent UE support and CGA from PT required. Paitent continues to often have posterior LOB/sway requiring assistance. intermittent rest breaks required. Will continue to progress toward all LTGs.    Personal Factors and Comorbidities Comorbidity 3+;Time since onset of injury/illness/exacerbation;Age    Comorbidities Anxiety, Multiple Myeloma with Thoracic Spinal METS, Stroke (L Basal Ganglia Infarct with R Hemiparesis, Thyroid Disease, Adhesive Capsulitis (L Shoulder), Dysphagia (d/t CVA)    Examination-Activity Limitations Transfers;Stairs;Stand;Locomotion Level;Sit;Bend    Examination-Participation Restrictions Community Activity;Yard Work    [employer]chant [employer] officer Evolving/Moderate complexity    Rehab Potential Good    PT Frequency 2x / week    PT Duration 8 weeks    PT Treatment/Interventions ADLs/Self Care Home Management;Moist Heat;Cryotherapy;Therapeutic activities;DME Instruction;Gait training;Stair training;Functional mobility training;Therapeutic exercise;Balance training;Patient/family education;Orthotic Fit/Training;Neuromuscular re-education;Manual techniques;Passive range of motion;Vestibular    PT Next Visit Plan Assess LTG + Recert/Discharge     Consulted and Agree with Plan of Care Patient           Patient will benefit from skilled therapeutic intervention in order to improve the following deficits and impairments:  Abnormal gait,Decreased endurance,Decreased balance,Decreased mobility,Difficulty walking,Postural dysfunction,Pain,Decreased strength,Decreased knowledge of use of DME,Decreased activity tolerance,Decreased range of motion  Visit Diagnosis: Other abnormalities of gait and mobility  Muscle weakness (generalized)  Repeated falls  Unsteadiness on feet     Problem List Patient Active Problem List   Diagnosis Date Noted  . Multiple myeloma not having achieved remission ([place]) 10/24/2015  . Dysphagia as late effect of stroke 07/10/2015  . Diarrhea 04/25/2015  . Cancer associated pain 04/25/2015  . Long term current use of anticoagulant therapy 04/25/2015  . Basal ganglia infarction ([place]) 02/12/2015  . Dizziness   . Expressive aphasia   . Past pointing   . Right leg weakness   . History of stroke 02/11/2015  . CVA (cerebral infarction) 02/11/2015  . Aspiration pneumonia ([place]) 02/11/2015  . Aphasia   . Difficulty speaking   . Speech difficult to understand   . Encounter for antineoplastic chemotherapy 01/31/2015  . Constipation 12/20/2014  . Rash 12/20/2014  . Peripheral edema 12/06/2014  . Multifactorial gait disorder 09/05/2014  . Multiple myeloma ([place])   . Paraplegia ([place]) 06/21/2014  . Postoperative anemia due to acute blood loss 06/21/2014  . 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 Spine metastasis ([place]) 06/17/2014  . Thoracic spine tumor 06/17/2014  . Metastatic bone cancer ([place]) 06/15/2014    Jones Bales, PT, DPT 08/06/2020, 12:16 PM  [facility] [address] [place], [place], [address] Phone: [phone]   Fax:  [phone]  Name: Jeremiah Owens MRN: [id number] Date of Birth: 1929-10-29

## 2020-08-08 ENCOUNTER — Encounter: Payer: Self-pay | Admitting: Physical Medicine & Rehabilitation

## 2020-08-08 ENCOUNTER — Encounter: Payer: Medicare Other | Attending: Physical Medicine & Rehabilitation | Admitting: Physical Medicine & Rehabilitation

## 2020-08-08 ENCOUNTER — Other Ambulatory Visit: Payer: Self-pay

## 2020-08-08 VITALS — BP 111/72 | HR 89 | Ht 69.0 in | Wt 154.0 lb

## 2020-08-08 DIAGNOSIS — I639 Cerebral infarction, unspecified: Secondary | ICD-10-CM | POA: Diagnosis not present

## 2020-08-08 DIAGNOSIS — I6381 Other cerebral infarction due to occlusion or stenosis of small artery: Secondary | ICD-10-CM

## 2020-08-08 DIAGNOSIS — I69391 Dysphagia following cerebral infarction: Secondary | ICD-10-CM | POA: Diagnosis not present

## 2020-08-08 DIAGNOSIS — M67912 Unspecified disorder of synovium and tendon, left shoulder: Secondary | ICD-10-CM | POA: Diagnosis not present

## 2020-08-08 NOTE — Patient Instructions (Signed)
REGULAR STRETCHING HEAT TYLENOL     PLEASE FEEL FREE TO CALL OUR OFFICE WITH ANY PROBLEMS OR QUESTIONS [phone])

## 2020-08-08 NOTE — Progress Notes (Signed)
Subjective:    Patient ID: Jeremiah Owens, male    DOB: 1930/04/26, 87 y.o.   MRN: [id number]  HPI   Mr. Cuccia is here in follow up of his gait disorder and dysphagia. He fell two weeks ago when he was getting up out of his chair at bedtime and landed on his left side. He literally fell on his left shoulder and bruised his left ribs. Xrays were negative.  He has taken 681m tylenol 3 times a day with moderate relief. Heat has helped somewhat.  He continues to use his rolling walker for balance.  He does feel that therapy has helped with his balance overall.  He still is engaged with PT at neuro rehab.  He is also seeing speech-language pathology regarding his swallowing and has seen some improvement in swallowing capacity.  Denies any new health issues since we last met.     Pain Inventory Average Pain 0 Pain Right Now 0 My pain is dull  In the last 24 hours, has pain interfered with the following? General activity 0 Relation with others 0 Enjoyment of life 2 What TIME of day is your pain at its worst? morning  Sleep (in general) Good  Pain is worse with: some activites Pain improves with: medication Relief from Meds: 8  Family History  Problem Relation Age of Onset  . Cancer Mother        breast  . Stroke Mother    Social History   Socioeconomic History  . Marital status: Married    Spouse name: Not on file  . Number of children: Not on file  . Years of education: Not on file  . Highest education level: Not on file  Occupational History  . Not on file  Tobacco Use  . Smoking status: Never Smoker  . Smokeless tobacco: Never Used  Vaping Use  . Vaping Use: Never used  Substance and Sexual Activity  . Alcohol use: No    Alcohol/week: 0.0 standard drinks  . Drug use: No  . Sexual activity: Not on file  Other Topics Concern  . Not on file  Social History Narrative  . Not on file   Social Determinants of Health   Financial Resource Strain: Not on file   Food Insecurity: Not on file  Transportation Needs: Not on file  Physical Activity: Not on file  Stress: Not on file  Social Connections: Not on file   Past Surgical History:  Procedure Laterality Date  . HERNIA REPAIR    . POSTERIOR CERVICAL FUSION/FORAMINOTOMY N/A 06/17/2014   Procedure: Thoracic five laminectomy for epidural tumor resection Thoracic 4-7 posterior lateral arthrodesis, segmental pedicle screw fixation.;  Surgeon: KAshok Pall MD;  [facility];  Service: Neurosurgery;  Laterality: N/A;   Past Surgical History:  Procedure Laterality Date  . HERNIA REPAIR    . POSTERIOR CERVICAL FUSION/FORAMINOTOMY N/A 06/17/2014   Procedure: Thoracic five laminectomy for epidural tumor resection Thoracic 4-7 posterior lateral arthrodesis, segmental pedicle screw fixation.;  Surgeon: KAshok Pall MD;  [facility];  Service: Neurosurgery;  Laterality: N/A;   Past Medical History:  Diagnosis Date  . Allergy   . Anxiety   . Bone cancer ([facility]    t_spine T-5  . Compression fracture   . Encounter for antineoplastic chemotherapy 01/31/2015  . Hearing loss   . Melanoma ([facility]   . Multiple myeloma ([facility]   . [place] [place]  . S/P radiation therapy 08/21/14-09/04/14   T4-6  25Gy/64f  . S/P radiation therapy 11/29/14-12/12/14   rt prox humerus/shoulder/scapula 25Gy/130f . Skin cancer 1994   melanoma  right neck   . [place] ([facility]  . Thyroid disease    BP 111/72   Pulse 89   Ht '5\' 9"'  (1.753 m)   Wt 154 lb (69.9 kg)   SpO2 98%   BMI 22.74 kg/m   Opioid Risk Score:   Fall Risk Score:  `1  Depression screen PHQ 2/9  Dep[facility]/9 05/02/2020 04/10/2015  Decreased Interest 0 0  Down, Depressed, Hopeless 0 0  PHQ - 2 Score 0 0  Altered sleeping 0 1  Tired, decreased energy 1 1  Change in appetite 0 0  Feeling bad or failure about yourself  0 0  Trouble concentrating 1 0  Moving slowly or fidgety/restless 0 0  Suicidal thoughts 0 0   PHQ-9 Score 2 2  Difficult doing work/chores Not difficult at all -  Some recent data might be hidden    Review of Systems  Constitutional: Negative.   HENT: Negative.   Eyes: Negative.   Respiratory: Negative.   Cardiovascular: Negative.   Gastrointestinal: Negative.   Endocrine: Negative.   Genitourinary: Negative.   Musculoskeletal: Positive for gait problem.  Skin: Negative.   Allergic/Immunologic: Negative.   Neurological: Positive for weakness.  Hematological: Negative.   Psychiatric/Behavioral: Negative.   All other systems reviewed and are negative.      Objective:   Physical Exam  Gen: no distress, normal appearing HEENT: oral mucosa pink and moist, NCAT Cardio: Reg rate Chest: normal effort, normal rate of breathing Abd: soft, non-distended Ext: no edema Skin: intact Neuro:  Alert and oriented x 3. Normal insight and awareness. Intact Memory. Normal language and speech. Cranial nerve exam unremarkable. Speech dysarthric. Less wet sounding. Posterior lean when standing. RLE 4/5 LLE 4+/5. RUE 5/5. LUE limited by shoulder pain.   Musculoskeletal: head forward posture, significant thoracic kyphosis.ongoing  Left shoulder limited by adhesive capsulitis and now with increased pain in ROM in all directions.  Psych: pleasant, normal affect          Assessment & Plan:  1.  History of left basal ganglia infarct with right hemiparesis 2.  History of multiple myeloma with thoracic spinal mets 3.  History of BPH 4. Dysphagia d/t CVA 5.  Multifactorial gait disorder related to the above in addition to his significant kyphotic posture and adhesive capsulitis in the left shoulder.  It seems as if he has taken some steps backwards over this past year or so as his physical activity decrease during Covid.     Plan: 1.  Made referral to outpatient OT to address lert shoulder ROM, pain and to help establish a HEP.  -Consider left shoulder injection at next visit 2.   Continue with outpt PT, SLP. May need another MBS soon 3.  discussed appropriate shoewear.  4. Reviewed safety at home. Unfortunately, he has a lot of risk factors for falling given his stroke, posture and multiple orthopedic problems.  A lot of his fall risk and need to be mitigated by appropriate technique and decision making.   15 minutes of direct patient time was spent in the office today.  I will see him back here in about 6 weeks.

## 2020-08-13 ENCOUNTER — Ambulatory Visit: Payer: Medicare Other

## 2020-08-13 ENCOUNTER — Other Ambulatory Visit: Payer: Self-pay

## 2020-08-13 DIAGNOSIS — M6281 Muscle weakness (generalized): Secondary | ICD-10-CM | POA: Diagnosis not present

## 2020-08-13 DIAGNOSIS — R2681 Unsteadiness on feet: Secondary | ICD-10-CM | POA: Diagnosis not present

## 2020-08-13 DIAGNOSIS — R293 Abnormal posture: Secondary | ICD-10-CM | POA: Diagnosis not present

## 2020-08-13 DIAGNOSIS — R2689 Other abnormalities of gait and mobility: Secondary | ICD-10-CM

## 2020-08-13 DIAGNOSIS — M25612 Stiffness of left shoulder, not elsewhere classified: Secondary | ICD-10-CM | POA: Diagnosis not present

## 2020-08-13 DIAGNOSIS — R296 Repeated falls: Secondary | ICD-10-CM

## 2020-08-13 NOTE — Therapy (Signed)
[facility] [address] [place], [place], [address] Phone: [phone]   Fax:  [phone]  Physical Therapy Treatment/Re-Certification  Patient Details  Name: Jeremiah Owens MRN: [id number] Date of Birth: 09-13-1929 Referring Provider (PT): [name], MD   Encounter Date: 08/13/2020   PT End of Session - 08/13/20 1023    Visit Number 23    Number of Visits 27    Date for PT Re-Evaluation 10/12/20   updated POC for 4 weeks, Cert for 60 days   Authorization Type Medicare    Progress Note Due on Visit 30    PT Start Time 1016    PT Stop Time 1100    PT Time Calculation (min) 44 min    Equipment Utilized During Treatment Gait belt    Activity Tolerance Patient tolerated treatment well    Behavior During Therapy WFL for tasks assessed/performed           Past Medical History:  Diagnosis Date  . Allergy   . Anxiety   . Bone cancer ([place])    t_spine T-5  . Compression fracture   . Encounter for antineoplastic chemotherapy 01/31/2015  . Hearing loss   . Melanoma ([place])   . Multiple myeloma ([place])   . Prostate cancer ([place]) 2002  . S/P radiation therapy 08/21/14-09/04/14   T4-6 25Gy/36f  . S/P radiation therapy 11/29/14-12/12/14   rt prox humerus/shoulder/scapula 25Gy/1105f . Skin cancer 1994   melanoma  right neck   . Stroke ([facility]  . Thyroid disease     Past Surgical History:  Procedure Laterality Date  . HERNIA REPAIR    . POSTERIOR CERVICAL FUSION/FORAMINOTOMY N/A 06/17/2014   Procedure: Thoracic five laminectomy for epidural tumor resection Thoracic 4-7 posterior lateral arthrodesis, segmental pedicle screw fixation.;  Surgeon: KyAshok PallMD;  [facility];  Service: Neurosurgery;  Laterality: N/A;    There were no vitals filed for this visit.   Subjective Assessment - 08/13/20 1021    Subjective Patient saw Dr. SwNaaman Plummerreports went well. Will be evaluated by OT tomorrow. Patient reports  one fall since last visit, but cannot remember how he fell.    Pertinent History Anxiety, Multiple Myeloma with Thoracic Spinal METS, Stroke (L Basal Ganglia Infarct with R Hemiparesis, Thyroid Disease, Adhesive Capsulitis (L Shoulder), Dysphagia (d/t CVA    Patient Stated Goals get well; get balance back    Currently in Pain? No/denies    Pain Onset In the past 7 days               [facility]/Exercise - 08/13/20 0001      Transfers   Transfers Sit to Stand;Stand to Sit    Sit to Stand 5: Supervision;With upper extremity assist;From bed    Sit to Stand Details Verbal cues for sequencing;Verbal cues for technique    Five time sit to stand comments  21.90 secs with BUE. continue to demo increased hesistancy to let go of arm rests.    Stand to Sit 5: Supervision;With upper extremity assist;To chair/3-in-1;To bed    Stand to Sit Details (indicate cue type and reason) Verbal cues for sequencing;Verbal cues for technique    Comments verbal cues for improved posture.      Ambulation/Gait   Ambulation/Gait Yes    Ambulation/Gait Assistance 5: Supervision    Ambulation/Gait Assistance Details ambulation with rollator around therapy gym x 400 ft, completed ambulation indoors due to weather (patient requesting not to  ambulate outdoors today). verbal cues for improved step length and toe clearance on RLE with ambulation, increased scuffing of toe noted.    Ambulation Distance (Feet) 400 Feet    Assistive device Rollator    Gait Pattern Step-through pattern;Decreased step length - right;Decreased step length - left;Decreased dorsiflexion - right;Decreased dorsiflexion - left;Right flexed knee in stance;Left flexed knee in stance;Trunk flexed    Ambulation Surface Level;Indoor    Gait velocity 14.52 secs = 2.25 ft/sec      Standardized Balance Assessment   Standardized Balance Assessment Berg Balance Test;Timed Up and Go Test      Berg Balance Test   Sit to Stand Able to stand   independently using hands    Standing Unsupported Able to stand safely 2 minutes    Sitting with Back Unsupported but Feet Supported on Floor or Stool Able to sit safely and securely 2 minutes    Stand to Sit Sits safely with minimal use of hands    Transfers Able to transfer with verbal cueing and /or supervision    Standing Unsupported with Eyes Closed Able to stand 10 seconds safely    Standing Ubsupported with Feet Together Able to place feet together independently and stand for 1 minute with supervision    From Standing, Reach Forward with Outstretched Arm Can reach forward >12 cm safely (5")    From Standing Position, Pick up Object from Floor Able to pick up shoe, needs supervision    From Standing Position, Turn to Look Behind Over each Shoulder Looks behind one side only/other side shows less weight shift    Turn 360 Degrees Able to turn 360 degrees safely but slowly    Standing Unsupported, Alternately Place Feet on Step/Stool Able to complete 4 steps without aid or supervision    Standing Unsupported, One Foot in Front Able to take small step independently and hold 30 seconds    Standing on One Leg Tries to lift leg/unable to hold 3 seconds but remains standing independently    Total Score 40      Timed Up and Go Test   TUG Normal TUG    Normal TUG (seconds) 21.21                  PT Education - 08/13/20 1107    Education Details Educated on progress toward LTG. Also educated on updated POC and focus on fall prevention/compensation strategies to promote improved safety within the home.    Person(s) Educated Patient;Spouse    Methods Explanation    Comprehension Verbalized understanding            PT Short Term Goals - 07/13/20 1131      PT SHORT TERM GOAL #1   Title Patient will undergo assesment of 5x sit <> stand test and LTG to be set as appropriate    Baseline 23.25 sec from 24" mat; 27.35 sec from 18" chair; has to use UE support.    Time 3    Period  Weeks    Status Achieved    Target Date 07/27/20             PT Long Term Goals - 08/13/20 1105      PT LONG TERM GOAL #1   Title Patient will report independence with final HEP for strengthening/balance/posture and daily walking program (ALL LTGs due:08/18/19)    Baseline patient reports independence, completing 2x/weekly    Time 6    Period Weeks    Status  On-going      PT LONG TERM GOAL #2   Title Patient will demo ability to ambulate > 500 ft over outdoor paved surfaces with LRAD and superivison for improved community mobility    Baseline 400 ft with rollator supervision with one instance of CGA; 400 ft indoors due to weather    Time 6    Period Weeks    Status Not Met      PT LONG TERM GOAL #3   Title Pt will perform TUG test in </= 18 sconds to indicate decreased risk of falling    Baseline 24.75 secs; 21.53 secs, 21.21 secs    Time 6    Period Weeks    Status Not Met      PT LONG TERM GOAL #4   Title Patient will improve Berg Balance to >/= 45/56 to demonstrate improved functional standing balance and reduced fall risk.    Baseline 32/56, 40/56 on 11/29, 40/56 on 1/10    Time 6    Period Weeks    Status Not Met      PT LONG TERM GOAL #5   Title Patient will improve gait speed to >/= 2.5 ft/sec to demonstrate improved community ambulation    Baseline 1.67 ft/sec, 1.95 ft/sec, 2.25 ft/sec    Time 6    Period Weeks    Status Not Met      PT LONG TERM GOAL #6   Title LTG to set for 5x sit <> stand test, to improve to less than or equal to 20 seconds for improved functional strength.    Baseline 27.35 sec from 18" chair with UE support; 21.90 secs    Time 6    Period Weeks    Status Not Met           Updated Short Term Goals:  PT Short Term Goals - 08/13/20 1124      PT SHORT TERM GOAL #1   Title = LTG          Updated Long Term Goals:   PT Long Term Goals - 08/13/20 1125      PT LONG TERM GOAL #1   Title Patient will report independence with  final HEP for strengthening/balance/posture and daily walking program (ALL LTGs: 09/10/20)    Baseline patient reports independence, completing 2x/weekly    Time 4    Period Weeks    Status On-going    Target Date 09/10/20      PT LONG TERM GOAL #2   Title Patient and caregiver will be independent with fall prevention strategies within the home to promote improved safety.    Baseline Dependent    Time 4    Period Weeks    Status New      PT LONG TERM GOAL #3   Title Pt will perform TUG test in </= 18 sconds to indicate decreased risk of falling    Baseline 24.75 secs; 21.53 secs, 21.21 secs    Time 4    Period Weeks    Status On-going      PT LONG TERM GOAL #4   Title Caregiver will verbalize and demonstrate ability to help patient from floor <> seated surface to promote improved safety within the home    Baseline dependent    Time 4    Period Weeks    Status New      PT LONG TERM GOAL #5   Title Patient will improve gait speed to >/= 2.5 ft/sec  to demonstrate improved community ambulation    Baseline 1.67 ft/sec, 1.95 ft/sec, 2.25 ft/sec    Time 4    Period Weeks    Status On-going      PT LONG TERM GOAL #6   Title LTG to set for 5x sit <> stand test, to improve to less than or equal to 20 seconds for improved functional strength.    Baseline 27.35 sec from 18" chair with UE support; 21.90 secs    Time 4    Period Weeks    Status On-going                Plan - 08/13/20 1108    Clinical Impression Statement Today's skilled PT session focused on assesment of progress toward all LTGs. Patient demonstrated progress toward LTG #3,5 and 6 today during session. Patient has demonstrated progress with PT services, however has been slow progress throughout POC. Patient is still deemed high fall risk with Berg Balance Score of 45/56, TUG time of 21.21 secs, and 5x sit <> stand of 21.90 secs. Patient will benefit from continued skilled PT services to address increased fall risk,  impaired balance, and promote improved safety within the home. PT educating patient and wife on the need for Gait Belt at this time to promote improved safety with transfers/ambulation, and PT encouraging patient to attend all future sessions for fall prevention training.    Personal Factors and Comorbidities Comorbidity 3+;Time since onset of injury/illness/exacerbation;Age    Comorbidities Anxiety, Multiple Myeloma with Thoracic Spinal METS, Stroke (L Basal Ganglia Infarct with R Hemiparesis, Thyroid Disease, Adhesive Capsulitis (L Shoulder), Dysphagia (d/t CVA)    Examination-Activity Limitations Transfers;Stairs;Stand;Locomotion Level;Sit;Bend    Examination-Participation Restrictions Community Activity;Yard Work    [employer] officer Evolving/Moderate complexity    Rehab Potential Good    PT Frequency 1x / week    PT Duration 4 weeks    PT Treatment/Interventions ADLs/Self Care Home Management;Moist Heat;Cryotherapy;Therapeutic activities;DME Instruction;Gait training;Stair training;Functional mobility training;Therapeutic exercise;Balance training;Patient/family education;Orthotic Fit/Training;Neuromuscular re-education;Manual techniques;Passive range of motion;Vestibular    PT Next Visit Plan Complete Fall Prevention; Provide Fall Prevention Handout; Transfer training with patient/wife    Consulted and Agree with Plan of Care Patient    Family Member Consulted Wife Kyra Manges)           Patient will benefit from skilled therapeutic intervention in order to improve the following deficits and impairments:  Abnormal gait,Decreased endurance,Decreased balance,Decreased mobility,Difficulty walking,Postural dysfunction,Pain,Decreased strength,Decreased knowledge of use of DME,Decreased activity tolerance,Decreased range of motion  Visit Diagnosis: Other abnormalities of gait and mobility  Muscle weakness (generalized)  Repeated falls  Unsteadiness on feet  Abnormal  posture     Problem List Patient Active Problem List   Diagnosis Date Noted  . Disorder of left rotator cuff 08/08/2020  . Multiple myeloma not having achieved remission ([place]) 10/24/2015  . Dysphagia as late effect of stroke 07/10/2015  . Diarrhea 04/25/2015  . Cancer associated pain 04/25/2015  . Long term current use of anticoagulant therapy 04/25/2015  . Basal ganglia infarction ([place]) 02/12/2015  . Dizziness   . Expressive aphasia   . Past pointing   . Right leg weakness   . History of stroke 02/11/2015  . CVA (cerebral infarction) 02/11/2015  . Aspiration pneumonia ([place]) 02/11/2015  . Aphasia   . Difficulty speaking   . Speech difficult to understand   . Encounter for antineoplastic chemotherapy 01/31/2015  . Constipation 12/20/2014  . Rash 12/20/2014  . Peripheral edema 12/06/2014  .  Multifactorial gait disorder 09/05/2014  . Multiple myeloma ([place])   . Paraplegia ([place]) 06/21/2014  . Postoperative anemia due to acute blood loss 06/21/2014  . 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 Spine metastasis ([place]) 06/17/2014  . Thoracic spine tumor 06/17/2014  . Metastatic bone cancer ([place]) 06/15/2014    Jones Bales, PT, DPT 08/13/2020, 11:22 AM  [facility] [address] [place], [place], [address] Phone: [phone]   Fax:  [phone]  Name: Mahmud Keithly MRN: [id number] Date of Birth: 25-Sep-1929

## 2020-08-14 ENCOUNTER — Encounter: Payer: Self-pay | Admitting: Occupational Therapy

## 2020-08-14 ENCOUNTER — Ambulatory Visit: Payer: Medicare Other | Admitting: Occupational Therapy

## 2020-08-14 DIAGNOSIS — R2681 Unsteadiness on feet: Secondary | ICD-10-CM

## 2020-08-14 DIAGNOSIS — R296 Repeated falls: Secondary | ICD-10-CM | POA: Diagnosis not present

## 2020-08-14 DIAGNOSIS — R293 Abnormal posture: Secondary | ICD-10-CM | POA: Diagnosis not present

## 2020-08-14 DIAGNOSIS — M6281 Muscle weakness (generalized): Secondary | ICD-10-CM

## 2020-08-14 DIAGNOSIS — M25612 Stiffness of left shoulder, not elsewhere classified: Secondary | ICD-10-CM | POA: Diagnosis not present

## 2020-08-14 DIAGNOSIS — R2689 Other abnormalities of gait and mobility: Secondary | ICD-10-CM | POA: Diagnosis not present

## 2020-08-14 DIAGNOSIS — M25512 Pain in left shoulder: Secondary | ICD-10-CM

## 2020-08-14 NOTE — Therapy (Signed)
[facility] [address] [place], [place], [address] Phone: [phone]   Fax:  [phone]  Occupational Therapy Evaluation  Patient Details  Name: Jeremiah Owens MRN: [id number] Date of Birth: 1930/02/29 Referring Provider (OT): Vickey Sages Date: 08/14/2020   OT End of Session - 08/14/20 1605    Visit Number 1    Number of Visits 17    Date for OT Re-Evaluation 10/28/20    Authorization Type Medicare and Tricare    Progress Note Due on Visit 10    OT Start Time 1315    OT Stop Time 1355    OT Time Calculation (min) 40 min    Activity Tolerance Patient tolerated treatment well    Behavior During Therapy [facility] for tasks assessed/performed           Past Medical History:  Diagnosis Date  . Allergy   . Anxiety   . Bone cancer ([place])    t_spine T-5  . Compression fracture   . Encounter for antineoplastic chemotherapy 01/31/2015  . Hearing loss   . Melanoma ([place])   . Multiple myeloma ([place])   . Prostate cancer ([place]) 2002  . S/P radiation therapy 08/21/14-09/04/14   T4-6 25Gy/40f  . S/P radiation therapy 11/29/14-12/12/14   rt prox humerus/shoulder/scapula 25Gy/121f . Skin cancer 1994   melanoma  right neck   . Stroke [place]  . Thyroid disease     Past Surgical History:  Procedure Laterality Date  . HERNIA REPAIR    . POSTERIOR CERVICAL FUSION/FORAMINOTOMY N/A 06/17/2014   Procedure: Thoracic five laminectomy for epidural tumor resection Thoracic 4-7 posterior lateral arthrodesis, segmental pedicle screw fixation.;  Surgeon: KyAshok PallMD;  [facility];  Service: Neurosurgery;  Laterality: N/A;    There were no vitals filed for this visit.   Subjective Assessment - 08/14/20 1324    Subjective  I fell and hurt my arm, and it just has not recovered    Currently in Pain? No/denies    Pain Score 0-No pain             [facility] OT Assessment - 08/14/20 0001      Assessment   Medical  Diagnosis Shoulder injury    Referring Provider (OT) SwNaaman Plummer  Onset Date/Surgical Date 07/26/20    Hand Dominance Right    Prior Therapy No therapy for shoulder injury      Precautions   Precautions Fall    Precaution Comments Gait disorder      Balance Screen   Has the patient fallen in the past 6 months Yes    How many times? 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 [employer]      ADL   Eating/Feeding Minimal assistance    Grooming Minimal assistance    Upper Body Bathing Modified independent    Lower Body Bathing Modified independent    Upper Body Dressing Increased time    Lower Body Dressing Minimal assistance    Toilet Transfer Modified independent    Toileting -  Hygiene Increase time    TuTransport planner-   fold down seat   ADL comments Uses right hand predominantly      Written Expression   Dominant Hand Right  Activity Tolerance   Activity Tolerance Tolerates < 10 min activity with changes in vital signs    Activity Tolerance Comments Becomes short of breath with actvity      Cognition   Overall Cognitive Status Impaired/Different from baseline    Cognition Comments Patient reports memory problems increasing over last year      Observation/Other Assessments   Focus on Therapeutic Outcomes (FOTO)  NA      Posture/Postural Control   Posture/Postural Control Postural limitations    Postural Limitations Rounded Shoulders;Forward head;Posterior pelvic tilt      Coordination   Gross Motor Movements are Fluid and Coordinated No    Fine Motor Movements are Fluid and Coordinated Yes      ROM / Strength   AROM / PROM / Strength AROM;PROM;Strength      AROM   Overall AROM  Deficits    AROM Assessment Site Shoulder    Right/Left Shoulder Left    Left Shoulder Flexion 50 Degrees    Left  Shoulder ABduction 35 Degrees      PROM   Overall PROM  Deficits    PROM Assessment Site Shoulder    Right/Left Shoulder Left    Left Shoulder Flexion 70 Degrees    Left Shoulder ABduction 70 Degrees      Strength   Overall Strength Deficits    Overall Strength Comments Unable to flex or abduct left shoulder against gravity without significant pain      Hand Function   Right Hand Gross Grasp Functional    Right Hand Grip (lbs) 46    Left Hand Gross Grasp Functional    Left Hand Grip (lbs) 46                           OT Education - 08/14/20 1605    Education Details Review of evaluation results    Person(s) Educated Patient    Methods Explanation    Comprehension Verbalized understanding            OT Short Term Goals - 08/14/20 1613      OT SHORT TERM GOAL #1   Title Patient will complete a self range of motion program for LUE    Time 4    Period Weeks    Status New    Target Date 09/28/20      OT SHORT TERM GOAL #2   Title Patient will demonstrate 40 degrees of active shoulder abduction without increase in pain symptoms    Baseline 35    Time 4    Period Weeks    Status New      OT SHORT TERM GOAL #3   Title Patient will demonstrate 55 degrees of active assisted shoulder flexion to aide with functional reach tasks    Baseline xx    Time 4    Period Weeks    Status New             OT Long Term Goals - 08/14/20 1615      OT LONG TERM GOAL #1   Title Patient will complete an updated HEP to address functional use and motion in LUE    Time 8    Period Weeks    Status New    Target Date 10/28/20      OT LONG TERM GOAL #2   Title Patient will demonstrate 60 degrees of active assisted flexion or abduction to aide with dressing and functional  reach tasks    Time 8    Period Weeks    Status New      OT LONG TERM GOAL #3   Title Patient will don his socks and shoes without physical assistance    Time 8    Period Weeks      OT LONG  TERM GOAL #4   Title xxx    Baseline xxx      OT LONG TERM GOAL #5   Title xxx                 Plan - 08/14/20 1606    Clinical Impression Statement Patient is an 87 yr old male with significant medical history, being seen here for gait disorder and with frequent falls.  Patient has had longstanding left shoulder issues, and was managing with routine cortizone injections per his report.  Patient had a fall on 12/23 where he significantly hurt his shoulder, noticing immediate pain, and decreased active range of motion.  Xrays indicate no fracture, however, patient with symptoms of rotator cuff injury.  Patient with limited PROM and crepitus in left shoulder, and now with significant pain and nearly no functional reach in non-dominant LUE.  Patient will benefit from skilled OT intervention to decrease symptoms and improve functional motion in left shoulder.    OT Occupational Profile and History Detailed Assessment- Review of Records and additional review of physical, cognitive, psychosocial history related to current functional performance    Occupational performance deficits (Please refer to evaluation for details): ADL's;IADL's    Body Structure / Function / Physical Skills ADL;Endurance;GMC;UE functional use;Balance;Decreased knowledge of precautions;Hearing;Vestibular;Body mechanics;Decreased knowledge of use of DME;Flexibility;IADL;Pain;Improper spinal/pelvic alignment;Strength;Edema;ROM    Rehab Potential Fair    Clinical Decision Making Several treatment options, min-mod task modification necessary    Comorbidities Affecting Occupational Performance: May have comorbidities impacting occupational performance    Modification or Assistance to Complete Evaluation  Min-Moderate modification of tasks or assist with assess necessary to complete eval    OT Frequency 2x / week    OT Duration 8 weeks    OT Treatment/Interventions Self-care/ADL training;Electrical  Stimulation;Iontophoresis;Therapeutic exercise;Moist Heat;Neuromuscular education;Patient/family education;Energy conservation;Therapist, nutritional;Therapeutic activities;Cryotherapy;Ultrasound;DME and/or AE instruction;Manual Therapy;Passive range of motion    Plan Supine or supported supine range of motion and manual - modalities as needed for symptom relief    Consulted and Agree with Plan of Care Patient           Patient will benefit from skilled therapeutic intervention in order to improve the following deficits and impairments:   Body Structure / Function / Physical Skills: ADL,Endurance,GMC,UE functional use,Balance,Decreased knowledge of precautions,Hearing,Vestibular,Body mechanics,Decreased knowledge of use of DME,Flexibility,IADL,Pain,Improper spinal/pelvic alignment,Strength,Edema,ROM       Visit Diagnosis: Acute pain of left shoulder - Plan: Ot plan of care cert/re-cert  Stiffness of left shoulder, not elsewhere classified - Plan: Ot plan of care cert/re-cert  Unsteadiness on feet - Plan: Ot plan of care cert/re-cert  Muscle weakness (generalized) - Plan: Ot plan of care cert/re-cert    Problem List Patient Active Problem List   Diagnosis Date Noted  . Disorder of left rotator cuff 08/08/2020  . Multiple myeloma not having achieved remission ([place]) 10/24/2015  . Dysphagia as late effect of stroke 07/10/2015  . Diarrhea 04/25/2015  . Cancer associated pain 04/25/2015  . Long term current use of anticoagulant therapy 04/25/2015  . Basal ganglia infarction ([place]) 02/12/2015  . Dizziness   . Expressive aphasia   . Past pointing   .  Right leg weakness   . History of stroke 02/11/2015  . CVA (cerebral infarction) 02/11/2015  . Aspiration pneumonia ([place]) 02/11/2015  . Aphasia   . Difficulty speaking   . Speech difficult to understand   . Encounter for antineoplastic chemotherapy 01/31/2015  . Constipation 12/20/2014  . Rash 12/20/2014  . Peripheral edema  12/06/2014  . Multifactorial gait disorder 09/05/2014  . Multiple myeloma ([place])   . Paraplegia ([place]) 06/21/2014  . Postoperative anemia due to acute blood loss 06/21/2014  . 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 Spine metastasis ([place]) 06/17/2014  . Thoracic spine tumor 06/17/2014  . Metastatic bone cancer ([place]) 06/15/2014    Mariah Milling, OTR/L 08/14/2020, 4:20 PM  [facility] [address] [place], [place], [address] Phone: [phone]   Fax:  [phone]  Name: Jeremiah Owens MRN: [id number] Date of Birth: [birth year]

## 2020-08-16 ENCOUNTER — Encounter: Payer: Self-pay | Admitting: Occupational Therapy

## 2020-08-16 ENCOUNTER — Ambulatory Visit: Payer: Medicare Other | Admitting: Occupational Therapy

## 2020-08-16 ENCOUNTER — Other Ambulatory Visit: Payer: Self-pay

## 2020-08-16 VITALS — HR 109

## 2020-08-16 DIAGNOSIS — M6281 Muscle weakness (generalized): Secondary | ICD-10-CM | POA: Diagnosis not present

## 2020-08-16 DIAGNOSIS — R296 Repeated falls: Secondary | ICD-10-CM | POA: Diagnosis not present

## 2020-08-16 DIAGNOSIS — M25612 Stiffness of left shoulder, not elsewhere classified: Secondary | ICD-10-CM | POA: Diagnosis not present

## 2020-08-16 DIAGNOSIS — M25512 Pain in left shoulder: Secondary | ICD-10-CM

## 2020-08-16 DIAGNOSIS — R2681 Unsteadiness on feet: Secondary | ICD-10-CM

## 2020-08-16 DIAGNOSIS — R293 Abnormal posture: Secondary | ICD-10-CM | POA: Diagnosis not present

## 2020-08-16 DIAGNOSIS — R2689 Other abnormalities of gait and mobility: Secondary | ICD-10-CM | POA: Diagnosis not present

## 2020-08-16 NOTE — Patient Instructions (Signed)
Pendulum Circular    Bend forward 90 at waist, leaning on table for support. Rock body in a circular pattern to move arm clockwise _10  times then counterclockwise 10_ times. Do _3_ sessions per day.  Pendulum Side to Side    Bend forward 90 at waist, leaning on table for support. Rock body from side to side and let arm swing freely. Repeat _10_ times. Do _3_ sessions per day.  Copyright  VHI. All rights reserved.   Copyright  VHI. All rights reserved.

## 2020-08-16 NOTE — Therapy (Signed)
[facility] [address] [place], [place], [address] Phone: [phone]   Fax:  [phone]  Occupational Therapy Treatment  Patient Details  Name: Jeremiah Owens MRN: [id number] Date of Birth: [birth year] Referring Provider (OT): Vickey Sages Date: 08/16/2020   OT End of Session - 08/16/20 1018    Visit Number 2    Number of Visits 17    Date for OT Re-Evaluation 10/28/20    Authorization Type Medicare and Tricare    Authorization - Visit Number 1    Progress Note Due on Visit 10    OT Start Time 1015    OT Stop Time 1057    OT Time Calculation (min) 42 min    Activity Tolerance Patient tolerated treatment well    Behavior During Therapy [facility] for tasks assessed/performed           Past Medical History:  Diagnosis Date  . Allergy   . Anxiety   . Bone cancer ([place])    t_spine T-5  . Compression fracture   . Encounter for antineoplastic chemotherapy 01/31/2015  . Hearing loss   . Melanoma ([place])   . Multiple myeloma ([place])   . Prostate cancer ([place]) 2002  . S/P radiation therapy 08/21/14-09/04/14   T4-6 25Gy/60f  . S/P radiation therapy 11/29/14-12/12/14   rt prox humerus/shoulder/scapula 25Gy/188f . Skin cancer 1994   melanoma  right neck   . Stroke ([facility]  . Thyroid disease     Past Surgical History:  Procedure Laterality Date  . HERNIA REPAIR    . POSTERIOR CERVICAL FUSION/FORAMINOTOMY N/A 06/17/2014   Procedure: Thoracic five laminectomy for epidural tumor resection Thoracic 4-7 posterior lateral arthrodesis, segmental pedicle screw fixation.;  Surgeon: KyAshok PallMD;  [facility];  Service: Neurosurgery;  Laterality: N/A;    Vitals:   08/16/20 1046  Pulse: (!) 109  SpO2: 100%     Subjective Assessment - 08/16/20 1019    Subjective  Pt denies any pain. Reports it was hurting last night before bed but felt better when he got up.    Currently in Pain? No/denies                         OT Treatments/Exercises (OP) - 08/16/20 1114      ADLs   ADL Comments education and review of goals written during moist heat.                  OT Education - 08/16/20 1037    Education Details pendulum swings in sitting with rollator as support    Person(s) Educated Patient    Methods Explanation;Demonstration;Handout    Comprehension Verbalized understanding;Returned demonstration            OT Short Term Goals - 08/16/20 1113      OT SHORT TERM GOAL #1   Title Patient will complete a self range of motion program for LUE    Time 4    Period Weeks    Status On-going    Target Date 09/28/20      OT SHORT TERM GOAL #2   Title Patient will demonstrate 40 degrees of active shoulder abduction without increase in pain symptoms    Baseline 35    Time 4    Period Weeks    Status New      OT SHORT TERM GOAL #3   Title Patient will demonstrate 55 degrees of  active assisted shoulder flexion to aide with functional reach tasks    Baseline xx    Time 4    Period Weeks    Status New             OT Long Term Goals - 08/14/20 1615      OT LONG TERM GOAL #1   Title Patient will complete an updated HEP to address functional use and motion in LUE    Time 8    Period Weeks    Status New    Target Date 10/28/20      OT LONG TERM GOAL #2   Title Patient will demonstrate 60 degrees of active assisted flexion or abduction to aide with dressing and functional reach tasks    Time 8    Period Weeks    Status New      OT LONG TERM GOAL #3   Title Patient will don his socks and shoes without physical assistance    Time 8    Period Weeks      OT LONG TERM GOAL #4   Title xxx    Baseline xxx      OT LONG TERM GOAL #5   Title xxx                 Plan - 08/16/20 1112    Clinical Impression Statement Pt limited by fatigue and discomfort/pain with LUE shoulder but making steady progress towards goals with gentle mobilization and  exercises.    OT Occupational Profile and History Detailed Assessment- Review of Records and additional review of physical, cognitive, psychosocial history related to current functional performance    Occupational performance deficits (Please refer to evaluation for details): ADL's;IADL's    Body Structure / Function / Physical Skills ADL;Endurance;GMC;UE functional use;Balance;Decreased knowledge of precautions;Hearing;Vestibular;Body mechanics;Decreased knowledge of use of DME;Flexibility;IADL;Pain;Improper spinal/pelvic alignment;Strength;Edema;ROM    Rehab Potential Fair    Clinical Decision Making Several treatment options, min-mod task modification necessary    Comorbidities Affecting Occupational Performance: May have comorbidities impacting occupational performance    Modification or Assistance to Complete Evaluation  Min-Moderate modification of tasks or assist with assess necessary to complete eval    OT Frequency 2x / week    OT Duration 8 weeks    OT Treatment/Interventions Self-care/ADL training;Electrical Stimulation;Iontophoresis;Therapeutic exercise;Moist Heat;Neuromuscular education;Patient/family education;Energy conservation;Therapist, nutritional;Therapeutic activities;Cryotherapy;Ultrasound;DME and/or AE instruction;Manual Therapy;Passive range of motion    Plan review pendulum in sitting, tolerated seated exercises (unweighted/grav eliminated) vs supine, ultrasound?    Consulted and Agree with Plan of Care Patient           Patient will benefit from skilled therapeutic intervention in order to improve the following deficits and impairments:   Body Structure / Function / Physical Skills: ADL,Endurance,GMC,UE functional use,Balance,Decreased knowledge of precautions,Hearing,Vestibular,Body mechanics,Decreased knowledge of use of DME,Flexibility,IADL,Pain,Improper spinal/pelvic alignment,Strength,Edema,ROM       Visit Diagnosis: Acute pain of left  shoulder  Stiffness of left shoulder, not elsewhere classified  Unsteadiness on feet  Muscle weakness (generalized)    Problem List Patient Active Problem List   Diagnosis Date Noted  . Disorder of left rotator cuff 08/08/2020  . Multiple myeloma not having achieved remission ([place]) 10/24/2015  . Dysphagia as late effect of stroke 07/10/2015  . Diarrhea 04/25/2015  . Cancer associated pain 04/25/2015  . Long term current use of anticoagulant therapy 04/25/2015  . Basal ganglia infarction ([place]) 02/12/2015  . Dizziness   . Expressive aphasia   . Past pointing   .  Right leg weakness   . History of stroke 02/11/2015  . CVA (cerebral infarction) 02/11/2015  . Aspiration pneumonia ([place]) 02/11/2015  . Aphasia   . Difficulty speaking   . Speech difficult to understand   . Encounter for antineoplastic chemotherapy 01/31/2015  . Constipation 12/20/2014  . Rash 12/20/2014  . Peripheral edema 12/06/2014  . Multifactorial gait disorder 09/05/2014  . Multiple myeloma ([place])   . Paraplegia ([place]) 06/21/2014  . Postoperative anemia due to acute blood loss 06/21/2014  . 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 Spine metastasis ([place]) 06/17/2014  . Thoracic spine tumor 06/17/2014  . Metastatic bone cancer ([place]) 06/15/2014    Zachery Conch MOT, OTR/L  08/16/2020, 11:15 AM  [facility] [address] [place], [place], [address] Phone: [phone]   Fax:  [phone]  Name: Kenshin Splawn MRN: [id number] Date of Birth: May 01, 1930

## 2020-08-20 ENCOUNTER — Ambulatory Visit: Payer: Medicare Other

## 2020-08-20 ENCOUNTER — Ambulatory Visit: Payer: Medicare Other | Admitting: Occupational Therapy

## 2020-08-22 ENCOUNTER — Ambulatory Visit: Payer: Medicare Other | Admitting: Occupational Therapy

## 2020-08-24 ENCOUNTER — Ambulatory Visit: Payer: Medicare Other | Admitting: Occupational Therapy

## 2020-08-24 ENCOUNTER — Other Ambulatory Visit: Payer: Self-pay

## 2020-08-24 ENCOUNTER — Ambulatory Visit: Payer: Medicare Other

## 2020-08-24 DIAGNOSIS — R2681 Unsteadiness on feet: Secondary | ICD-10-CM | POA: Diagnosis not present

## 2020-08-24 DIAGNOSIS — R2689 Other abnormalities of gait and mobility: Secondary | ICD-10-CM | POA: Diagnosis not present

## 2020-08-24 DIAGNOSIS — R293 Abnormal posture: Secondary | ICD-10-CM | POA: Diagnosis not present

## 2020-08-24 DIAGNOSIS — M6281 Muscle weakness (generalized): Secondary | ICD-10-CM | POA: Diagnosis not present

## 2020-08-24 DIAGNOSIS — R296 Repeated falls: Secondary | ICD-10-CM

## 2020-08-24 DIAGNOSIS — M25612 Stiffness of left shoulder, not elsewhere classified: Secondary | ICD-10-CM | POA: Diagnosis not present

## 2020-08-24 NOTE — Therapy (Signed)
[facility] [address] [place], [place], [address] Phone: [phone]   Fax:  [phone]  Physical Therapy Treatment  Patient Details  Name: Jeremiah Owens MRN: [id number] Date of Birth: 28-Sep-1929 Referring Provider (PT): [name], MD   Encounter Date: 08/24/2020   PT End of Session - 08/24/20 0931    Visit Number 24    Number of Visits 27    Date for PT Re-Evaluation 10/12/20   updated POC for 4 weeks, Cert for 60 days   Authorization Type Medicare    Progress Note Due on Visit 30    PT Start Time 0931    PT Stop Time 1015    PT Time Calculation (min) 44 min    Equipment Utilized During Treatment Gait belt    Activity Tolerance Patient tolerated treatment well    Behavior During Therapy [facility] for tasks assessed/performed           Past Medical History:  Diagnosis Date  . Allergy   . Anxiety   . Bone cancer ([place])    t_spine T-5  . Compression fracture   . Encounter for antineoplastic chemotherapy 01/31/2015  . Hearing loss   . Melanoma ([place])   . Multiple myeloma ([place])   . Prostate cancer ([place]) 2002  . S/P radiation therapy 08/21/14-09/04/14   T4-6 25Gy/25f  . S/P radiation therapy 11/29/14-12/12/14   rt prox humerus/shoulder/scapula 25Gy/126f . Skin cancer 1994   melanoma  right neck   . [place]  . Thyroid disease     Past Surgical History:  Procedure Laterality Date  . HERNIA REPAIR    . POSTERIOR CERVICAL FUSION/FORAMINOTOMY N/A 06/17/2014   Procedure: Thoracic five laminectomy for epidural tumor resection Thoracic 4-7 posterior lateral arthrodesis, segmental pedicle screw fixation.;  Surgeon: KyAshok PallMD;  [facility];  Service: Neurosurgery;  Laterality: N/A;    There were no vitals filed for this visit.   Subjective Assessment - 08/24/20 0934    Subjective Havent been out in this weather. No falls since last visit. Patient reports some pain in shoulder with  pendulums. Wife got gait belt for home use, but did not bring today.    Pertinent History Anxiety, Multiple Myeloma with Thoracic Spinal METS, Stroke (L Basal Ganglia Infarct with R Hemiparesis, Thyroid Disease, Adhesive Capsulitis (L Shoulder), Dysphagia (d/t CVA    Patient Stated Goals get well; get balance back    Currently in Pain? No/denies    Pain Onset In the past 7 days                 [facility]20 0001      Transfers   Transfers Sit to Stand;Stand to Sit    Sit to Stand 5: Supervision;With upper extremity assist;From bed    Stand to Sit 5: Supervision;With upper extremity assist;To chair/3-in-1;To bed      Ambulation/Gait   Ambulation/Gait Yes    Ambulation/Gait Assistance 5: Supervision    Ambulation/Gait Assistance Details into/out of therapy session withAD    Assistive device Rollator    Gait Pattern Step-through pattern;Decreased step length - right;Decreased step length - left;Decreased dorsiflexion - right;Decreased dorsiflexion - left;Right flexed knee in stance;Left flexed knee in stance;Trunk flexed    Ambulation Surface Level;Indoor      Self-Care   Self-Care Other Self-Care Comments    Other Self-Care Comments  PT completing fall prevention education training with patient and wife today. PT printed  and reviewed falls checklist (see patient instructions). Went through [employer] that incrase risk for falls. Spouse reporting one rug that has area curled up, PT educating on to remove rug or use rug tape to keep it adhered to floor to avoid tripping hazard. PT also educating on importance of proper lighting. With ambulation into house from garage, patient has one incline/ramp area that has no rails and is no using AD at this time. PT problem solving with patient and spouse to use house walker to ascend/descend and transition to rollator in garage to ensure patient always has AD with him at all times to promote improved safety. If this  does not work wife interested in having rail installed, PT will provide resources for this if needed at next session. Grab bar currently in bathroom is suction, and patient reports that this is not sturdy. PT also educating on need for need grab bar to promote improved safety within bathroom. Patient is wearig whistle to alert wife of fall if she is home. However, Wife currently leaves patient at home by himself at times to run errands. PT educating on potential for life alert or carrying a wireless phone is pocket while she is gone with the numbers taped to the back due to decreased cognition/memory concerns. patient has had a few falls with donning/doffing clothes, PT educating to complete all aspects in seated as possible, and will alert OT to work on LE dressing techniques to promote safety. PT providing handout on how to get up from a fall, with wife's assistance. Wife has purchased a gait belt but unaware of how to use. Wife to bring belt to next session, and will teach wife how to utilize gait belt and working on fall training to promote improved patient and caregiver safety.           Provided following Bolded Handouts:   Access Code: [email or web address] URL: [email or web address] Date: 08/24/2020 Prepared by: Baldomero Lamy  Exercises Sit to Stand with Armchair - 1 x daily - 7 x weekly - 2 sets - 5 reps Seated March with Resistance - 2 x daily - 5 x weekly - 2 sets - 10 reps Seated Scapular Retraction - 2 x daily - 5 x weekly - 2 sets - 10 reps Seated Hamstring Stretch - 3 x daily - 5 x weekly - 1 sets - 3 reps - 30 sec hold Romberg Stance - 1 x daily - 5 x weekly - 1 sets - 3 reps Wide Stance with Head Rotations and Unilateral Counter Support - 1 x daily - 5 x weekly - 1 sets - 10 reps Wide Stance with Head Nods and Unilateral Counter Support - 1 x daily - 5 x weekly - 1 sets - 10 reps Side to Side Weight Shift with Unilateral Counter Support - 1 x daily - 5 x weekly - 1 sets - 10  reps  Patient Education Falls at Home Checklist How to Get Up After a Fall    PT Education - 08/24/20 1018    Education Details See Self Care Section; Fall Prevention Handouts Provided    Person(s) Educated Patient;Spouse    Methods Explanation;Handout    Comprehension Verbalized understanding;Need further instruction            PT Short Term Goals - 08/13/20 1124      PT SHORT TERM GOAL #1   Title = LTG             PT Long Term Goals - 08/13/20  [place] #1   Title Patient will report independence with final HEP for strengthening/balance/posture and daily walking program (ALL LTGs: 09/10/20)    Baseline patient reports independence, completing 2x/weekly    Time 4    Period Weeks    Status On-going    Target Date 09/10/20      PT LONG TERM GOAL #2   Title Patient and caregiver will be independent with fall prevention strategies within the home to promote improved safety.    Baseline Dependent    Time 4    Period Weeks    Status New      PT LONG TERM GOAL #3   Title Pt will perform TUG test in </= 18 sconds to indicate decreased risk of falling    Baseline 24.75 secs; 21.53 secs, 21.21 secs    Time 4    Period Weeks    Status On-going      PT LONG TERM GOAL #4   Title Caregiver will verbalize and demonstrate ability to help patient from floor <> seated surface to promote improved safety within the home    Baseline dependent    Time 4    Period Weeks    Status New      PT LONG TERM GOAL #5   Title Patient will improve gait speed to >/= 2.5 ft/sec to demonstrate improved community ambulation    Baseline 1.67 ft/sec, 1.95 ft/sec, 2.25 ft/sec    Time 4    Period Weeks    Status On-going      PT LONG TERM GOAL #6   Title LTG to set for 5x sit <> stand test, to improve to less than or equal to 20 seconds for improved functional strength.    Baseline 27.35 sec from 18" chair with UE support; 21.90 secs    Time 4    Period Weeks    Status  On-going                 Plan - 08/24/20 1029    Clinical Impression Statement Today's skilled PT session focused on fall prevention training, spent majority of session educating wife and patient on techniques to improve safety within the home. PT problem solving with patient and wife as possible, and providing options/solutions for current issues within the home. Wife purchased gait belt and will bring into next session to work on floor <> sit training to promote improved safety with getting up from a fall. Will continue to progress toward all LTGs.    Personal Factors and Comorbidities Comorbidity 3+;Time since onset of injury/illness/exacerbation;Age    Comorbidities Anxiety, Multiple Myeloma with Thoracic Spinal METS, Stroke (L Basal Ganglia Infarct with R Hemiparesis, Thyroid Disease, Adhesive Capsulitis (L Shoulder), Dysphagia (d/t CVA)    Examination-Activity Limitations Transfers;Stairs;Stand;Locomotion Level;Sit;Bend    Examination-Participation Restrictions Community Activity;Yard Work    [employer]chant [employer] officer Evolving/Moderate complexity    Rehab Potential Good    PT Frequency 1x / week    PT Duration 4 weeks    PT Treatment/Interventions ADLs/Self Care Home Management;Moist Heat;Cryotherapy;Therapeutic activities;DME Instruction;Gait training;Stair training;Functional mobility training;Therapeutic exercise;Balance training;Patient/family education;Orthotic Fit/Training;Neuromuscular re-education;Manual techniques;Passive range of motion;Vestibular    PT Next Visit Plan Did patient bring wife? Transfer training with patient/wife. Brake Management    Consulted and Agree with Plan of Care Patient    Family Member Consulted Wife Kyra Manges)           Patient will benefit from skilled  therapeutic intervention in order to improve the following deficits and impairments:  Abnormal gait,Decreased endurance,Decreased balance,Decreased mobility,Difficulty walking,Postural  dysfunction,Pain,Decreased strength,Decreased knowledge of use of DME,Decreased activity tolerance,Decreased range of motion  Visit Diagnosis: Repeated falls  Abnormal posture  Unsteadiness on feet  Muscle weakness (generalized)  Other abnormalities of gait and mobility     Problem List Patient Active Problem List   Diagnosis Date Noted  . Disorder of left rotator cuff 08/08/2020  . Multiple myeloma not having achieved remission ([place]) 10/24/2015  . Dysphagia as late effect of stroke 07/10/2015  . Diarrhea 04/25/2015  . Cancer associated pain 04/25/2015  . Long term current use of anticoagulant therapy 04/25/2015  . Basal ganglia infarction ([place]) 02/12/2015  . Dizziness   . Expressive aphasia   . Past pointing   . Right leg weakness   . History of stroke 02/11/2015  . CVA (cerebral infarction) 02/11/2015  . Aspiration pneumonia ([place]) 02/11/2015  . Aphasia   . Difficulty speaking   . Speech difficult to understand   . Encounter for antineoplastic chemotherapy 01/31/2015  . Constipation 12/20/2014  . Rash 12/20/2014  . Peripheral edema 12/06/2014  . Multifactorial gait disorder 09/05/2014  . Multiple myeloma ([place])   . Paraplegia ([place]) 06/21/2014  . Postoperative anemia due to acute blood loss 06/21/2014  . 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 Spine metastasis ([place]) 06/17/2014  . Thoracic spine tumor 06/17/2014  . Metastatic bone cancer ([place]) 06/15/2014    Jones Bales, PT, DPT 08/24/2020, 10:30 AM  [facility] [address] [place], [place], [address] Phone: [phone]   Fax:  [phone]  Name: Jeremiah Owens MRN: [id number] Date of Birth: [birth year]

## 2020-08-24 NOTE — Patient Instructions (Signed)
Access Code: [email or web address] URL: [email or web address] Date: 08/24/2020 Prepared by: Baldomero Lamy  Exercises Sit to Stand with Armchair - 1 x daily - 7 x weekly - 2 sets - 5 reps Seated March with Resistance - 2 x daily - 5 x weekly - 2 sets - 10 reps Seated Scapular Retraction - 2 x daily - 5 x weekly - 2 sets - 10 reps Seated Hamstring Stretch - 3 x daily - 5 x weekly - 1 sets - 3 reps - 30 sec hold Romberg Stance - 1 x daily - 5 x weekly - 1 sets - 3 reps Wide Stance with Head Rotations and Unilateral Counter Support - 1 x daily - 5 x weekly - 1 sets - 10 reps Wide Stance with Head Nods and Unilateral Counter Support - 1 x daily - 5 x weekly - 1 sets - 10 reps Side to Side Weight Shift with Unilateral Counter Support - 1 x daily - 5 x weekly - 1 sets - 10 reps  Patient Education Falls at Home Checklist How to Get Up After a Fall

## 2020-08-27 ENCOUNTER — Ambulatory Visit: Payer: Medicare Other | Admitting: Occupational Therapy

## 2020-08-27 ENCOUNTER — Encounter: Payer: Self-pay | Admitting: Occupational Therapy

## 2020-08-27 ENCOUNTER — Other Ambulatory Visit: Payer: Self-pay

## 2020-08-27 DIAGNOSIS — M6281 Muscle weakness (generalized): Secondary | ICD-10-CM

## 2020-08-27 DIAGNOSIS — R2681 Unsteadiness on feet: Secondary | ICD-10-CM | POA: Diagnosis not present

## 2020-08-27 DIAGNOSIS — R2689 Other abnormalities of gait and mobility: Secondary | ICD-10-CM

## 2020-08-27 DIAGNOSIS — R296 Repeated falls: Secondary | ICD-10-CM | POA: Diagnosis not present

## 2020-08-27 DIAGNOSIS — M25612 Stiffness of left shoulder, not elsewhere classified: Secondary | ICD-10-CM

## 2020-08-27 DIAGNOSIS — R293 Abnormal posture: Secondary | ICD-10-CM | POA: Diagnosis not present

## 2020-08-27 DIAGNOSIS — M25512 Pain in left shoulder: Secondary | ICD-10-CM

## 2020-08-27 NOTE — Therapy (Signed)
[facility] [address] [place], [place], [address] Phone: [phone]   Fax:  [phone]  Occupational Therapy Treatment  Patient Details  Name: Jeremiah Owens MRN: [id number] Date of Birth: 1930-03-21 Referring Provider (OT): Vickey Sages Date: 08/27/2020   OT End of Session - 08/27/20 1628    Visit Number 3    Number of Visits 17    Date for OT Re-Evaluation 10/28/20    Authorization Type Medicare and [employer]    Authorization - Visit Number 2    Progress Note Due on Visit 10    OT Start Time 1616    OT Stop Time 1700    OT Time Calculation (min) 44 min    Activity Tolerance Patient tolerated treatment well    Behavior During Therapy [facility] for tasks assessed/performed           Past Medical History:  Diagnosis Date  . Allergy   . Anxiety   . Bone cancer ([place])    t_spine T-5  . Compression fracture   . Encounter for antineoplastic chemotherapy 01/31/2015  . Hearing loss   . Melanoma ([place])   . Multiple myeloma ([place])   . Prostate cancer ([place]) 2002  . S/P radiation therapy 08/21/14-09/04/14   T4-6 25Gy/18f  . S/P radiation therapy 11/29/14-12/12/14   rt prox humerus/shoulder/scapula 25Gy/170f . Skin cancer 1994   melanoma  right neck   . [place]  . Thyroid disease     Past Surgical History:  Procedure Laterality Date  . HERNIA REPAIR    . POSTERIOR CERVICAL FUSION/FORAMINOTOMY N/A 06/17/2014   Procedure: Thoracic five laminectomy for epidural tumor resection Thoracic 4-7 posterior lateral arthrodesis, segmental pedicle screw fixation.;  Surgeon: KyAshok PallMD;  [facility];  Service: Neurosurgery;  Laterality: N/A;    There were no vitals filed for this visit.   Subjective Assessment - 08/27/20 1629    Subjective  pt reports pain with the pendulum swings when in sitting but not in standing.    Currently in Pain? No/denies                        OT  Treatments/Exercises (OP) - 08/27/20 1659      Shoulder Exercises: Supine   Other Supine Exercises on elevated/incline d/t SOB in supine. Pt completed 10 shoulder flexion and chest press with mod A facilitation at elbow for weakness. Pt reports pain with all exercises and evidence of excessive creptius in shoulder with all movements      Shoulder Exercises: Seated   Retraction AROM;Both;10 reps   unweighted   Other Seated Exercises table slides for forward reaching, circumduction. Pt reports significant pain and discomfort with exercises so exercises stopped. Pt able to maintaing prolonged stretch with elbows and hands on table with no reports of pain. Pain only present with active and active assisted and movements. Pt tolerated passive movements in all directions of LUE but with limited range.      Manual Therapy   Manual Therapy Joint mobilization    Joint Mobilization LUE scap                    OT Short Term Goals - 08/16/20 1113      OT SHORT TERM GOAL #1   Title Patient will complete a self range of motion program for LUE    Time 4    Period Weeks  Status On-going    Target Date 09/28/20      OT SHORT TERM GOAL #2   Title Patient will demonstrate 40 degrees of active shoulder abduction without increase in pain symptoms    Baseline 35    Time 4    Period Weeks    Status New      OT SHORT TERM GOAL #3   Title Patient will demonstrate 55 degrees of active assisted shoulder flexion to aide with functional reach tasks    Baseline xx    Time 4    Period Weeks    Status New             OT Long Term Goals - 08/14/20 1615      OT LONG TERM GOAL #1   Title Patient will complete an updated HEP to address functional use and motion in LUE    Time 8    Period Weeks    Status New    Target Date 10/28/20      OT LONG TERM GOAL #2   Title Patient will demonstrate 60 degrees of active assisted flexion or abduction to aide with dressing and functional reach tasks     Time 8    Period Weeks    Status New      OT LONG TERM GOAL #3   Title Patient will don his socks and shoes without physical assistance    Time 8    Period Weeks      OT LONG TERM GOAL #4   Title xxx    Baseline xxx      OT LONG TERM GOAL #5   Title xxx                 Plan - 08/27/20 1630    Clinical Impression Statement Pt with a lot of creptius with shoulder flexion LUE at approx 80-90 degrees and greater. Pt may benefit from ultrasound.    OT Occupational Profile and History Detailed Assessment- Review of Records and additional review of physical, cognitive, psychosocial history related to current functional performance    Occupational performance deficits (Please refer to evaluation for details): ADL's;IADL's    Body Structure / Function / Physical Skills ADL;Endurance;GMC;UE functional use;Balance;Decreased knowledge of precautions;Hearing;Vestibular;Body mechanics;Decreased knowledge of use of DME;Flexibility;IADL;Pain;Improper spinal/pelvic alignment;Strength;Edema;ROM    Rehab Potential Fair    Clinical Decision Making Several treatment options, min-mod task modification necessary    Comorbidities Affecting Occupational Performance: May have comorbidities impacting occupational performance    Modification or Assistance to Complete Evaluation  Min-Moderate modification of tasks or assist with assess necessary to complete eval    OT Frequency 2x / week    OT Duration 8 weeks    OT Treatment/Interventions Self-care/ADL training;Electrical Stimulation;Iontophoresis;Therapeutic exercise;Moist Heat;Neuromuscular education;Patient/family education;Energy conservation;Therapist, nutritional;Therapeutic activities;Cryotherapy;Ultrasound;DME and/or AE instruction;Manual Therapy;Passive range of motion    Plan review pendulum in sitting, tolerated seated exercises (unweighted/grav eliminated) vs supine, ultrasound?    Consulted and Agree with Plan of Care Patient            Patient will benefit from skilled therapeutic intervention in order to improve the following deficits and impairments:   Body Structure / Function / Physical Skills: ADL,Endurance,GMC,UE functional use,Balance,Decreased knowledge of precautions,Hearing,Vestibular,Body mechanics,Decreased knowledge of use of DME,Flexibility,IADL,Pain,Improper spinal/pelvic alignment,Strength,Edema,ROM       Visit Diagnosis: Unsteadiness on feet  Muscle weakness (generalized)  Other abnormalities of gait and mobility  Acute pain of left shoulder  Stiffness of left shoulder, not elsewhere classified  Problem List Patient Active Problem List   Diagnosis Date Noted  . Disorder of left rotator cuff 08/08/2020  . Multiple myeloma not having achieved remission ([place]) 10/24/2015  . Dysphagia as late effect of stroke 07/10/2015  . Diarrhea 04/25/2015  . Cancer associated pain 04/25/2015  . Long term current use of anticoagulant therapy 04/25/2015  . Basal ganglia infarction ([place]) 02/12/2015  . Dizziness   . Expressive aphasia   . Past pointing   . Right leg weakness   . History of stroke 02/11/2015  . CVA (cerebral infarction) 02/11/2015  . Aspiration pneumonia ([place]) 02/11/2015  . Aphasia   . Difficulty speaking   . Speech difficult to understand   . Encounter for antineoplastic chemotherapy 01/31/2015  . Constipation 12/20/2014  . Rash 12/20/2014  . Peripheral edema 12/06/2014  . Multifactorial gait disorder 09/05/2014  . Multiple myeloma ([place])   . Paraplegia ([place]) 06/21/2014  . Postoperative anemia due to acute blood loss 06/21/2014  . 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 Spine metastasis ([place]) 06/17/2014  . Thoracic spine tumor 06/17/2014  . Metastatic bone cancer ([place]) 06/15/2014    Zachery Conch MOT, OTR/L  08/27/2020, 5:04 PM  [facility] [address] [place], [place], [address] Phone: [phone]    Fax:  [phone]  Name: Jeremiah Owens MRN: [id number] Date of Birth: 12-Aug-1929

## 2020-08-29 ENCOUNTER — Ambulatory Visit: Payer: Medicare Other

## 2020-08-29 ENCOUNTER — Ambulatory Visit: Payer: Medicare Other | Admitting: Occupational Therapy

## 2020-08-29 ENCOUNTER — Other Ambulatory Visit: Payer: Self-pay

## 2020-08-29 ENCOUNTER — Encounter: Payer: Self-pay | Admitting: Occupational Therapy

## 2020-08-29 DIAGNOSIS — M6281 Muscle weakness (generalized): Secondary | ICD-10-CM | POA: Diagnosis not present

## 2020-08-29 DIAGNOSIS — R2681 Unsteadiness on feet: Secondary | ICD-10-CM

## 2020-08-29 DIAGNOSIS — R296 Repeated falls: Secondary | ICD-10-CM | POA: Diagnosis not present

## 2020-08-29 DIAGNOSIS — R293 Abnormal posture: Secondary | ICD-10-CM | POA: Diagnosis not present

## 2020-08-29 DIAGNOSIS — R2689 Other abnormalities of gait and mobility: Secondary | ICD-10-CM

## 2020-08-29 DIAGNOSIS — M25612 Stiffness of left shoulder, not elsewhere classified: Secondary | ICD-10-CM

## 2020-08-29 DIAGNOSIS — M25512 Pain in left shoulder: Secondary | ICD-10-CM

## 2020-08-29 NOTE — Therapy (Signed)
[facility] [address] [place], [place], [address] Phone: [phone]   Fax:  [phone]  Physical Therapy Treatment  Patient Details  Name: Jeremiah Owens MRN: [id number] Date of Birth: October 15, 1929 Referring Provider (PT): [name], MD   Encounter Date: 08/29/2020   PT End of Session - 08/29/20 1019    Visit Number 25    Number of Visits 27    Date for PT Re-Evaluation 10/12/20   updated POC for 4 weeks, Cert for 60 days   Authorization Type Medicare    Progress Note Due on Visit 30    PT Start Time 1016    PT Stop Time 1100    PT Time Calculation (min) 44 min    Equipment Utilized During Treatment Gait belt    Activity Tolerance Patient tolerated treatment well    Behavior During Therapy WFL for tasks assessed/performed           Past Medical History:  Diagnosis Date  . Allergy   . Anxiety   . Bone cancer ([place])    t_spine T-5  . Compression fracture   . Encounter for antineoplastic chemotherapy 01/31/2015  . Hearing loss   . Melanoma ([place])   . Multiple myeloma ([place])   . Prostate cancer ([place]) 2002  . S/P radiation therapy 08/21/14-09/04/14   T4-6 25Gy/18f  . S/P radiation therapy 11/29/14-12/12/14   rt prox humerus/shoulder/scapula 25Gy/116f . Skin cancer 1994   melanoma  right neck   . Stroke ([facility]  . Thyroid disease     Past Surgical History:  Procedure Laterality Date  . HERNIA REPAIR    . POSTERIOR CERVICAL FUSION/FORAMINOTOMY N/A 06/17/2014   Procedure: Thoracic five laminectomy for epidural tumor resection Thoracic 4-7 posterior lateral arthrodesis, segmental pedicle screw fixation.;  Surgeon: KyAshok PallMD;  [facility];  Service: Neurosurgery;  Laterality: N/A;    There were no vitals filed for this visit.   Subjective Assessment - 08/29/20 1020    Subjective Patient reports no new changes. No falls. Brought the gait belt into today.    Pertinent History Anxiety,  Multiple Myeloma with Thoracic Spinal METS, Stroke (L Basal Ganglia Infarct with R Hemiparesis, Thyroid Disease, Adhesive Capsulitis (L Shoulder), Dysphagia (d/t CVA    Patient Stated Goals get well; get balance back    Currently in Pain? No/denies    Pain Onset In the past 7 days               [facility] 08[facility]20 0001      Transfers   Transfers Floor to Transfer    Floor to Transfer 4: Min assist;3: Mod assist    Floor to Transfer Details (indicate cue type and reason) With red mat on floor completed transfer to floor to work on improved ability to complete floor <> standing/seated transfer to promot eimproved safety with ability to get up after fall. PT helped patient slowly to floor, and positioned patient on back (as wife reports this is how he typically falls or is found after a fall). Attempted initially with rolling to L side, but increased difficulty noted and patient unable to succesfully get in quadruped position due to increased pain in L Shoulder. Restarted with patient supine, and this time working on rolling to R side and transition to quadruped with patient able to complete with Min A for leg positioning. Once in quadruped positioned patient able to independently place BUE onto mat and use  LLE and BUE to push into standing position. Intermittent rest breaks required during completion due to labored breathing. patient stayed 97-99% with 02sat during completion. Patient able to come into standing position from sloor with Min to Mod A. PT teaching and explaining methods ot promote improved safety of family member/caregiver with assisting patient from floor. Addressed any questions/concerns of patient/wife.    Floor to Transfer Details Verbal cues for technique;Verbal cues for sequencing;Manual facilitation for weight shifting      Ambulation/Gait   Ambulation/Gait Yes    Ambulation/Gait Assistance 5: Supervision    Ambulation/Gait Assistance Details into/out  of therapy session.    Ambulation Distance (Feet) --   clinic distance   Assistive device Rollator    Gait Pattern Step-through pattern;Decreased step length - right;Decreased step length - left;Decreased dorsiflexion - right;Decreased dorsiflexion - left;Right flexed knee in stance;Left flexed knee in stance;Trunk flexed    Ambulation Surface Level;Indoor      Self-Care   Self-Care Other Self-Care Comments    Other Self-Care Comments  PT taught patient/wife how to apply personal gait belt for use at home. PT completing initially, and then utilizing teach back method with wife to ensure proper completion. Wife able to demo correctly. PT educating on proper use of gait belt.                  PT Education - 08/29/20 1218    Education Details See Self Care/Transfer Training section    Person(s) Educated Patient;Spouse    Methods Explanation;Demonstration    Comprehension Verbalized understanding;Returned demonstration            PT Short Term Goals - 08/13/20 1124      PT SHORT TERM GOAL #1   Title = LTG             PT Long Term Goals - 08/13/20 1125      PT LONG TERM GOAL #1   Title Patient will report independence with final HEP for strengthening/balance/posture and daily walking program (ALL LTGs: 09/10/20)    Baseline patient reports independence, completing 2x/weekly    Time 4    Period Weeks    Status On-going    Target Date 09/10/20      PT LONG TERM GOAL #2   Title Patient and caregiver will be independent with fall prevention strategies within the home to promote improved safety.    Baseline Dependent    Time 4    Period Weeks    Status New      PT LONG TERM GOAL #3   Title Pt will perform TUG test in </= 18 sconds to indicate decreased risk of falling    Baseline 24.75 secs; 21.53 secs, 21.21 secs    Time 4    Period Weeks    Status On-going      PT LONG TERM GOAL #4   Title Caregiver will verbalize and demonstrate ability to help patient from  floor <> seated surface to promote improved safety within the home    Baseline dependent    Time 4    Period Weeks    Status New      PT LONG TERM GOAL #5   Title Patient will improve gait speed to >/= 2.5 ft/sec to demonstrate improved community ambulation    Baseline 1.67 ft/sec, 1.95 ft/sec, 2.25 ft/sec    Time 4    Period Weeks    Status On-going      PT LONG TERM GOAL #6  Title LTG to set for 5x sit <> stand test, to improve to less than or equal to 20 seconds for improved functional strength.    Baseline 27.35 sec from 18" chair with UE support; 21.90 secs    Time 4    Period Weeks    Status On-going                 Plan - 08/29/20 1219    Clinical Impression Statement Continued fall prevention training during session. PT educating on proper use of gait belt to patient/wife. Completed floor <> standing/sit training to promote improved home safety and ability to get up from floor with fall. Increased asssitance required with patient having labored breathing despite vitals staying in normal limits. Will continue to progress toward all LTGs.    Personal Factors and Comorbidities Comorbidity 3+;Time since onset of injury/illness/exacerbation;Age    Comorbidities Anxiety, Multiple Myeloma with Thoracic Spinal METS, Stroke (L Basal Ganglia Infarct with R Hemiparesis, Thyroid Disease, Adhesive Capsulitis (L Shoulder), Dysphagia (d/t CVA)    Examination-Activity Limitations Transfers;Stairs;Stand;Locomotion Level;Sit;Bend    Examination-Participation Restrictions Community Activity;Yard Work    [employer]chant [employer] officer Evolving/Moderate complexity    Rehab Potential Good    PT Frequency 1x / week    PT Duration 4 weeks    PT Treatment/Interventions ADLs/Self Care Home Management;Moist Heat;Cryotherapy;Therapeutic activities;DME Instruction;Gait training;Stair training;Functional mobility training;Therapeutic exercise;Balance training;Patient/family education;Orthotic  Fit/Training;Neuromuscular re-education;Manual techniques;Passive range of motion;Vestibular    PT Next Visit Plan Continue Transfer training with patient/wife. Brake Management of AD    Consulted and Agree with Plan of Care Patient    Family Member Consulted Wife Kyra Manges)           Patient will benefit from skilled therapeutic intervention in order to improve the following deficits and impairments:  Abnormal gait,Decreased endurance,Decreased balance,Decreased mobility,Difficulty walking,Postural dysfunction,Pain,Decreased strength,Decreased knowledge of use of DME,Decreased activity tolerance,Decreased range of motion  Visit Diagnosis: Unsteadiness on feet  Repeated falls  Other abnormalities of gait and mobility  Muscle weakness (generalized)  Abnormal posture     Problem List Patient Active Problem List   Diagnosis Date Noted  . Disorder of left rotator cuff 08/08/2020  . Multiple myeloma not having achieved remission ([place]) 10/24/2015  . Dysphagia as late effect of stroke 07/10/2015  . Diarrhea 04/25/2015  . Cancer associated pain 04/25/2015  . Long term current use of anticoagulant therapy 04/25/2015  . Basal ganglia infarction ([place]) 02/12/2015  . Dizziness   . Expressive aphasia   . Past pointing   . Right leg weakness   . History of stroke 02/11/2015  . CVA (cerebral infarction) 02/11/2015  . Aspiration pneumonia ([place]) 02/11/2015  . Aphasia   . Difficulty speaking   . Speech difficult to understand   . Encounter for antineoplastic chemotherapy 01/31/2015  . Constipation 12/20/2014  . Rash 12/20/2014  . Peripheral edema 12/06/2014  . Multifactorial gait disorder 09/05/2014  . Multiple myeloma ([place])   . Paraplegia ([place]) 06/21/2014  . Postoperative anemia due to acute blood loss 06/21/2014  . 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 Spine metastasis ([place]) 06/17/2014  . Thoracic spine tumor 06/17/2014  . Metastatic bone cancer ([place]) 06/15/2014    Jones Bales, PT, DPT 08/29/2020, 12:22 PM  [facility] [address] [place], [place], [address] Phone: [phone]   Fax:  [phone]  Name: Werner Labella MRN: [id number] Date of Birth: 1930-08-04

## 2020-08-29 NOTE — Therapy (Signed)
[facility] [address] [place], [place], [address] Phone: [phone]   Fax:  [phone]  Occupational Therapy Treatment  Patient Details  Name: Jeremiah Owens MRN: [id number] Date of Birth: 1929-09-18 Referring Provider (OT): Vickey Sages Date: 08/29/2020   OT End of Session - 08/29/20 0037    Visit Number 4    Number of Visits 17    Date for OT Re-Evaluation 10/28/20    Authorization Type Medicare and [employer]    Authorization - Visit Number 3    Progress Note Due on Visit 10    OT Start Time 1232    OT Stop Time 1315    OT Time Calculation (min) 43 min    Activity Tolerance Patient tolerated treatment well    Behavior During Therapy [facility] for tasks assessed/performed           Past Medical History:  Diagnosis Date  . Allergy   . Anxiety   . Bone cancer ([place])    t_spine T-5  . Compression fracture   . Encounter for antineoplastic chemotherapy 01/31/2015  . Hearing loss   . Melanoma ([place])   . Multiple myeloma ([place])   . Prostate cancer ([place]) 2002  . S/P radiation therapy 08/21/14-09/04/14   T4-6 25Gy/40f  . S/P radiation therapy 11/29/14-12/12/14   rt prox humerus/shoulder/scapula 25Gy/130f . Skin cancer 1994   melanoma  right neck   . [place] ([facility]  . Thyroid disease     Past Surgical History:  Procedure Laterality Date  . HERNIA REPAIR    . POSTERIOR CERVICAL FUSION/FORAMINOTOMY N/A 06/17/2014   Procedure: Thoracic five laminectomy for epidural tumor resection Thoracic 4-7 posterior lateral arthrodesis, segmental pedicle screw fixation.;  Surgeon: KyAshok PallMD;  [facility];  Service: Neurosurgery;  Laterality: N/A;    There were no vitals filed for this visit.   Subjective Assessment - 08/29/20 1234    Subjective  Pt denies any pain right now.    Currently in Pain? No/denies                        OT Treatments/Exercises (OP) - 08/29/20 1244      Shoulder  Exercises: Supine   Other Supine Exercises on incline d/t SOB. Pt with pain during PROM and AAROM for shoulder flexion, ER, abd. unble to do unassisted.      Shoulder Exercises: Seated   Other Seated Exercises hemi glide x 10 forward reach with LUE AAROM      Shoulder Exercises: Sidelying   Other Sidelying Exercises UE ranger for LUE with AAROM movements in shoulder flexion, retraction/protraction. No reports of pain during exercises      Manual Therapy   Manual Therapy Soft tissue mobilization    Soft tissue mobilization LUE scapular region - pt with pain to touch at supraspinatus region                    OT Short Term Goals - 08/16/20 1113      OT SHORT TERM GOAL #1   Title Patient will complete a self range of motion program for LUE    Time 4    Period Weeks    Status On-going    Target Date 09/28/20      OT SHORT TERM GOAL #2   Title Patient will demonstrate 40 degrees of active shoulder abduction without increase in pain symptoms    Baseline 35  Time 4    Period Weeks    Status New      OT SHORT TERM GOAL #3   Title Patient will demonstrate 55 degrees of active assisted shoulder flexion to aide with functional reach tasks    Baseline xx    Time 4    Period Weeks    Status New             OT Long Term Goals - 08/14/20 1615      OT LONG TERM GOAL #1   Title Patient will complete an updated HEP to address functional use and motion in LUE    Time 8    Period Weeks    Status New    Target Date 10/28/20      OT LONG TERM GOAL #2   Title Patient will demonstrate 60 degrees of active assisted flexion or abduction to aide with dressing and functional reach tasks    Time 8    Period Weeks    Status New      OT LONG TERM GOAL #3   Title Patient will don his socks and shoes without physical assistance    Time 8    Period Weeks      OT LONG TERM GOAL #4   Title xxx    Baseline xxx      OT LONG TERM GOAL #5   Title xxx                  Plan - 08/29/20 1247    Clinical Impression Statement Waiting on doctor approval or denial for use of modalities d/t active cancer. Pt tolerated side lying/ AAROM exercises this day with no reports of pain.    OT Occupational Profile and History Detailed Assessment- Review of Records and additional review of physical, cognitive, psychosocial history related to current functional performance    Occupational performance deficits (Please refer to evaluation for details): ADL's;IADL's    Body Structure / Function / Physical Skills ADL;Endurance;GMC;UE functional use;Balance;Decreased knowledge of precautions;Hearing;Vestibular;Body mechanics;Decreased knowledge of use of DME;Flexibility;IADL;Pain;Improper spinal/pelvic alignment;Strength;Edema;ROM    Rehab Potential Fair    Clinical Decision Making Several treatment options, min-mod task modification necessary    Comorbidities Affecting Occupational Performance: May have comorbidities impacting occupational performance    Modification or Assistance to Complete Evaluation  Min-Moderate modification of tasks or assist with assess necessary to complete eval    OT Frequency 2x / week    OT Duration 8 weeks    OT Treatment/Interventions Self-care/ADL training;Electrical Stimulation;Iontophoresis;Therapeutic exercise;Moist Heat;Neuromuscular education;Patient/family education;Energy conservation;Therapist, nutritional;Therapeutic activities;Cryotherapy;Ultrasound;DME and/or AE instruction;Manual Therapy;Passive range of motion    Plan side lying shoulder exercises AAROM exercises, ultrasound?    Consulted and Agree with Plan of Care Patient           Patient will benefit from skilled therapeutic intervention in order to improve the following deficits and impairments:   Body Structure / Function / Physical Skills: ADL,Endurance,GMC,UE functional use,Balance,Decreased knowledge of precautions,Hearing,Vestibular,Body mechanics,Decreased knowledge of use  of DME,Flexibility,IADL,Pain,Improper spinal/pelvic alignment,Strength,Edema,ROM       Visit Diagnosis: Unsteadiness on feet  Other abnormalities of gait and mobility  Muscle weakness (generalized)  Abnormal posture  Stiffness of left shoulder, not elsewhere classified  Acute pain of left shoulder    Problem List Patient Active Problem List   Diagnosis Date Noted  . Disorder of left rotator cuff 08/08/2020  . Multiple myeloma not having achieved remission ([place]) 10/24/2015  . Dysphagia as late effect of stroke  07/10/2015  . Diarrhea 04/25/2015  . Cancer associated pain 04/25/2015  . Long term current use of anticoagulant therapy 04/25/2015  . Basal ganglia infarction ([place]) 02/12/2015  . Dizziness   . Expressive aphasia   . Past pointing   . Right leg weakness   . History of stroke 02/11/2015  . CVA (cerebral infarction) 02/11/2015  . Aspiration pneumonia ([place]) 02/11/2015  . Aphasia   . Difficulty speaking   . Speech difficult to understand   . Encounter for antineoplastic chemotherapy 01/31/2015  . Constipation 12/20/2014  . Rash 12/20/2014  . Peripheral edema 12/06/2014  . Multifactorial gait disorder 09/05/2014  . Multiple myeloma ([place])   . Paraplegia ([place]) 06/21/2014  . Postoperative anemia due to acute blood loss 06/21/2014  . 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 Spine metastasis ([place]) 06/17/2014  . Thoracic spine tumor 06/17/2014  . Metastatic bone cancer ([place]) 06/15/2014    Zachery Conch MOT, OTR/L  08/29/2020, 1:11 PM  [facility] [address] [place], [place], [address] Phone: [phone]   Fax:  [phone]  Name: Jeremiah Owens MRN: [id number] Date of Birth: 11-15-1929

## 2020-08-31 ENCOUNTER — Ambulatory Visit: Payer: Medicare Other | Admitting: Occupational Therapy

## 2020-08-31 ENCOUNTER — Other Ambulatory Visit: Payer: Self-pay

## 2020-08-31 ENCOUNTER — Ambulatory Visit: Payer: Medicare Other

## 2020-08-31 ENCOUNTER — Telehealth: Payer: Self-pay

## 2020-08-31 DIAGNOSIS — R293 Abnormal posture: Secondary | ICD-10-CM | POA: Diagnosis not present

## 2020-08-31 DIAGNOSIS — R1312 Dysphagia, oropharyngeal phase: Secondary | ICD-10-CM

## 2020-08-31 DIAGNOSIS — R296 Repeated falls: Secondary | ICD-10-CM | POA: Diagnosis not present

## 2020-08-31 DIAGNOSIS — R2689 Other abnormalities of gait and mobility: Secondary | ICD-10-CM | POA: Diagnosis not present

## 2020-08-31 DIAGNOSIS — M6281 Muscle weakness (generalized): Secondary | ICD-10-CM | POA: Diagnosis not present

## 2020-08-31 DIAGNOSIS — M25612 Stiffness of left shoulder, not elsewhere classified: Secondary | ICD-10-CM | POA: Diagnosis not present

## 2020-08-31 DIAGNOSIS — I69391 Dysphagia following cerebral infarction: Secondary | ICD-10-CM

## 2020-08-31 DIAGNOSIS — R2681 Unsteadiness on feet: Secondary | ICD-10-CM | POA: Diagnosis not present

## 2020-08-31 NOTE — Telephone Encounter (Signed)
Dr. Naaman Plummer- Pt is ready for follow up modified barium swallow exam. Please order if you agree. As always, feel free to reach out with questions/concerns.  Thank you- Garald Balding, [place], [place] [phone]

## 2020-08-31 NOTE — Telephone Encounter (Signed)
Order in.  Thanks Glendell Docker!

## 2020-08-31 NOTE — Therapy (Signed)
[facility] [address] [place], [place], [address] Phone: [phone]   Fax:  [phone]  Speech Language Pathology Treatment/Discharge Summary  Patient Details  Name: Jeremiah Owens MRN: [id number] Date of Birth: 30-Dec-1929 Referring Provider (SLP): [name], MD   Encounter Date: 08/31/2020   End of Session - 08/31/20 1044    Visit Number 10    Number of Visits 17    Date for SLP Re-Evaluation 08/17/20    SLP Start Time 0934    SLP Stop Time  1015    SLP Time Calculation (min) 41 min    Activity Tolerance Patient tolerated treatment well           Past Medical History:  Diagnosis Date  . Allergy   . Anxiety   . Bone cancer ([place])    t_spine T-5  . Compression fracture   . Encounter for antineoplastic chemotherapy 01/31/2015  . Hearing loss   . Melanoma ([place])   . Multiple myeloma ([place])   . Prostate cancer ([place]) 2002  . S/P radiation therapy 08/21/14-09/04/14   T4-6 25Gy/48fx  . S/P radiation therapy 11/29/14-12/12/14   rt prox humerus/shoulder/scapula 25Gy/65fx  . Skin cancer 1994   melanoma  right neck   . Stroke ([place])   . Thyroid disease     Past Surgical History:  Procedure Laterality Date  . HERNIA REPAIR    . POSTERIOR CERVICAL FUSION/FORAMINOTOMY N/A 06/17/2014   Procedure: Thoracic five laminectomy for epidural tumor resection Thoracic 4-7 posterior lateral arthrodesis, segmental pedicle screw fixation.;  Surgeon: Ashok Pall, MD;  Location: [facility];  Service: Neurosurgery;  Laterality: N/A;    There were no vitals filed for this visit.     SPEECH THERAPY DISCHARGE SUMMARY  Visits from Start of Care: 10  Current functional level related to goals / functional outcomes: See below.   Remaining deficits: See below. Dysphagia persists, however pt and wife state pt appears to be stronger with swallowing and he is coughing less than when ST was initiated.   Education /  Equipment: HEP procedure, swallow precautions, need to cont HEP x3/week after modified.   Plan: Patient agrees to discharge.  Patient goals were met. Patient is being discharged due to                                                     ?????pt met current potential - wife is independent with HEP assistance and assisting pt with aspiration precautions.           ADULT SLP TREATMENT - 08/31/20 0946      General Information   Behavior/Cognition Hard of hearing;Pleasant mood;Cooperative;Alert      Treatment Provided   Treatment provided Dysphagia      Dysphagia Treatment   Temperature Spikes Noted No    Respiratory Status Room air    Treatment Methods Skilled observation;Compensation strategy training;Patient/caregiver education    Patient observed directly with PO's Yes    Type of PO's observed Thin liquids    Liquids provided via Cup    Oral Phase Signs & Symptoms --   none noted   Pharyngeal Phase Signs & Symptoms --   none notedbut req'd cues for precautions   Other treatment/comments "I think he's stronger than he was.", "He's not coughing as much with his meals."  SLP sees evidence of this today with pt not requiring H2O as much as previous sessions, and swallows taking less time to trigger than previous sessions. No throat clears with water. Wife also states pt's frequency of coughing has decr'd since previous session. SLP suggested due to patient fatigue smaller portions and meals with less mastication necessary as evening meal. Wife was independent with assisting pt with HEP and with precautions. "I still have to remind him of this when we're eating." SLP told pt and wife pt should ocmplete HEP x3/week after modified (MBSS).      Assessment / Recommendations / Plan   Plan MBS;Discharge SLP treatment due to (comment)   pt wife independent with assisting pt with precautinos and with HEP     Progression Toward Goals   Progression toward goals Goals met, education completed,  patient discharged from [facility] Education - 08/31/20 1043    Education Details complete HEP x3/week after modified    Person(s) Educated Patient;Spouse    Methods Explanation    Comprehension Verbalized understanding            SLP Short Term Goals - 06/20/20 1130      SLP SHORT TERM GOAL #1   Title pt will demo HEP for swallowing with occasional min A from SLP or family x 2 sessions    Baseline 06-20-20    Status Partially Met            SLP Long Term Goals - 08/31/20 1009      SLP LONG TERM GOAL #1   Title pt will adequately follow swallow precautions with POs with min A from SLP    Baseline 07-06-20    Period --   or 17 sessions, for all LTGs   Status Achieved      SLP LONG TERM GOAL #2   Title pt will demo HEP for swallowing with occasional min-mod A from SLP or family x 2 sessions    Baseline 07-31-20    Time --    Period --    Status Achieved      SLP LONG TERM GOAL #3   Title pt will adequately follow swallow precautions with POs with occasional min A from wife in 2 sessions    Baseline 07-31-20    Time --    Period --    Status Achieved            Plan - 08/31/20 1044    Clinical Impression Statement SLP believes Jeremiah Owens is ready for repeat modified (MBSS) - SLPcontacted MD to request order after last session but family has not heard anything re: scheduling. SLP to notify again, via phone message. Jeremiah Owens cont to complete dysphagia HEP for deficit muscle groups with usual min-mod assistance from his wife and cont to perfrom some exercises correctly prior to wife giving him step by step directions. Pt continues to require cues to incr compliance with swallow precautions and both wife and pt cont to report he is coughing significantly less than he was upon initiation of ST. Wife was again modified independent with assisting pt today and pt responding well to wife's cues/assistance so pt will be d/c'd. SLP to contact pt's MD and inform him pt ready for  follow up MBSS.    Treatment/Interventions Aspiration precaution training;Pharyngeal strengthening exercises;Diet toleration management by SLP;Trials of upgraded texture/liquids;Cueing hierarchy;SLP instruction and feedback;Compensatory strategies;Internal/external aids;Patient/family education    Potential to Achieve Goals  Fair    Potential Considerations Previous level of function;Cooperation/participation level;Severity of impairments    Consulted and Agree with Plan of Care Patient           Patient will benefit from skilled therapeutic intervention in order to improve the following deficits and impairments:   Oropharyngeal dysphagia    Problem List Patient Active Problem List   Diagnosis Date Noted  . Disorder of left rotator cuff 08/08/2020  . Multiple myeloma not having achieved remission ([facility]) 10/24/2015  . Dysphagia as late effect of stroke 07/10/2015  . Diarrhea 04/25/2015  . Cancer associated pain 04/25/2015  . Long term current use of anticoagulant therapy 04/25/2015  . Basal ganglia infarction ([facility]) 02/12/2015  . Dizziness   . Expressive aphasia   . Past pointing   . Right leg weakness   . History of stroke 02/11/2015  . CVA (cerebral infarction) 02/11/2015  . Aspiration pneumonia ([place] [facility]) 02/11/2015  . Aphasia   . Difficulty speaking   . Speech difficult to understand   . Encounter for antineoplastic chemotherapy 01/31/2015  . Constipation 12/20/2014  . Rash 12/20/2014  . Peripheral edema 12/06/2014  . Multifactorial gait disorder 09/05/2014  . Multiple myeloma ([facility])   . Paraplegia ([facility]) 06/21/2014  . Postoperative anemia due to acute blood loss 06/21/2014  . 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 Spine metastasis ([place]) 06/17/2014  . Thoracic spine tumor 06/17/2014  . Metastatic bone cancer ([place]) 06/15/2014    [facility] ,[place], [place]  08/31/2020, 10:47 AM  [facility] [address] [place], [place], [address] Phone: [phone]   Fax:  [phone]   Name: Jeremiah Owens MRN: [id number] Date of Birth: October 12, 1929

## 2020-09-03 ENCOUNTER — Other Ambulatory Visit: Payer: Self-pay

## 2020-09-03 ENCOUNTER — Ambulatory Visit: Payer: Medicare Other

## 2020-09-03 ENCOUNTER — Encounter: Payer: Self-pay | Admitting: Occupational Therapy

## 2020-09-03 ENCOUNTER — Ambulatory Visit: Payer: Medicare Other | Admitting: Occupational Therapy

## 2020-09-03 DIAGNOSIS — M6281 Muscle weakness (generalized): Secondary | ICD-10-CM

## 2020-09-03 DIAGNOSIS — R2689 Other abnormalities of gait and mobility: Secondary | ICD-10-CM

## 2020-09-03 DIAGNOSIS — M25512 Pain in left shoulder: Secondary | ICD-10-CM

## 2020-09-03 DIAGNOSIS — R296 Repeated falls: Secondary | ICD-10-CM | POA: Diagnosis not present

## 2020-09-03 DIAGNOSIS — R293 Abnormal posture: Secondary | ICD-10-CM

## 2020-09-03 DIAGNOSIS — R2681 Unsteadiness on feet: Secondary | ICD-10-CM | POA: Diagnosis not present

## 2020-09-03 DIAGNOSIS — M25612 Stiffness of left shoulder, not elsewhere classified: Secondary | ICD-10-CM

## 2020-09-03 NOTE — Patient Instructions (Signed)
Access Code: [email or web address] URL: [email or web address] Date: 09/03/2020 Prepared by: Baldomero Lamy  Exercises Sit to Stand with Armchair - 1 x daily - 7 x weekly - 2 sets - 5 reps Seated Scapular Retraction - 2 x daily - 5 x weekly - 1 sets - 10 reps Romberg Stance - 1 x daily - 5 x weekly - 1 sets - 3 reps - 20 seconds hold Side to Side Weight Shift with Unilateral Counter Support - 1 x daily - 5 x weekly - 1 sets - 10 reps Wide Stance with Head Rotations and Unilateral Counter Support - 1 x daily - 5 x weekly - 1 sets - 10 reps Supine March with Resistance Band - 1 x daily - 5 x weekly - 1 sets - 10 reps Hooklying Single Leg Bent Knee Fallouts with Resistance - 1 x daily - 5 x weekly - 1 sets - 10 reps Supine Bridge with Resistance Band - 1 x daily - 5 x weekly - 1 sets - 10 reps  Patient Education Falls at Home Checklist How to Get Up After a Fall

## 2020-09-03 NOTE — Therapy (Signed)
[facility] [address] [place], [place], [address] Phone: [phone]   Fax:  [phone]  Occupational Therapy Treatment  Patient Details  Name: Jeremiah Owens MRN: [id number] Date of Birth: [birth year] Referring Provider (OT): Vickey Sages Date: 09/03/2020   OT End of Session - 09/03/20 1151    Visit Number 5    Number of Visits 17    Date for OT Re-Evaluation 10/28/20    Authorization Type Medicare and [employer]    Authorization - Visit Number 4    Progress Note Due on Visit 10    OT Start Time 1148    OT Stop Time 1229    OT Time Calculation (min) 41 min    Activity Tolerance Patient tolerated treatment well    Behavior During Therapy [facility] for tasks assessed/performed           Past Medical History:  Diagnosis Date  . Allergy   . Anxiety   . Bone cancer ([place])    t_spine T-5  . Compression fracture   . Encounter for antineoplastic chemotherapy 01/31/2015  . Hearing loss   . Melanoma ([facility])   . Multiple myeloma ([place])   . Prostate cancer ([facility]) 2002  . S/P radiation therapy 08/21/14-09/04/14   T4-6 25Gy/58f  . S/P radiation therapy 11/29/14-12/12/14   rt prox humerus/shoulder/scapula 25Gy/180f . Skin cancer 1994   melanoma  right neck   . Stroke ([facility]  . Thyroid disease     Past Surgical History:  Procedure Laterality Date  . HERNIA REPAIR    . POSTERIOR CERVICAL FUSION/FORAMINOTOMY N/A 06/17/2014   Procedure: Thoracic five laminectomy for epidural tumor resection Thoracic 4-7 posterior lateral arthrodesis, segmental pedicle screw fixation.;  Surgeon: KyAshok PallMD;  [facility];  Service: Neurosurgery;  Laterality: N/A;    There were no vitals filed for this visit.   Subjective Assessment - 09/03/20 1152    Subjective  Pt reports pain in left shoulder.    Currently in Pain? Yes    Pain Score 8     Pain Location Shoulder    Pain Orientation Left    Pain Descriptors /  Indicators Aching    Pain Type Chronic pain    Pain Onset More than a month ago    Pain Frequency Intermittent                        OT Treatments/Exercises (OP) - 09/03/20 1210      ADLs   UB Dressing pt required max A for removing LUE from sleeves and donning for use of ultrasound this day. Pt stood and completed tucking in shirt at end of session with stand by assistance.      Shoulder Exercises: Seated   Other Seated Exercises table slides forward x 10 with emphasis on stretching at end point. Pt with limited report of pain. more of stretch pain not typical sharp pain    Other Seated Exercises Pt worked at table top with reaching on knee bolster for prolonged stretching for LUE shoulder flexion. no pain reported during stretches but with active movement reports pain. some support provided at elbow at LUE for holding higher stretch      Modalities   Modalities Ultrasound      Ultrasound   Ultrasound Location LUE shoulder    Ultrasound Parameters continuous 3 mhz, 8 minutes 0.8 w/cm2    Ultrasound Goals Pain  OT Short Term Goals - 09/03/20 1221      OT SHORT TERM GOAL #1   Title Patient will complete a self range of motion program for LUE    Time 4    Period Weeks    Status On-going    Target Date 09/28/20      OT SHORT TERM GOAL #2   Title Patient will demonstrate 40 degrees of active shoulder abduction without increase in pain symptoms    Baseline 35    Time 4    Period Weeks    Status On-going      OT SHORT TERM GOAL #3   Title Patient will demonstrate 55 degrees of active assisted shoulder flexion to aide with functional reach tasks    Baseline xx    Time 4    Period Weeks    Status On-going             OT Long Term Goals - 08/14/20 1615      OT LONG TERM GOAL #1   Title Patient will complete an updated HEP to address functional use and motion in LUE    Time 8    Period Weeks    Status New    Target Date  10/28/20      OT LONG TERM GOAL #2   Title Patient will demonstrate 60 degrees of active assisted flexion or abduction to aide with dressing and functional reach tasks    Time 8    Period Weeks    Status New      OT LONG TERM GOAL #3   Title Patient will don his socks and shoes without physical assistance    Time 8    Period Weeks      OT LONG TERM GOAL #4   Title xxx    Baseline xxx      OT LONG TERM GOAL #5   Title xxx                 Plan - 09/03/20 1218    Clinical Impression Statement Oncologist gave ok for use of modalities. Ultrasound used today for pain relief.    OT Occupational Profile and History Detailed Assessment- Review of Records and additional review of physical, cognitive, psychosocial history related to current functional performance    Occupational performance deficits (Please refer to evaluation for details): ADL's;IADL's    Body Structure / Function / Physical Skills ADL;Endurance;GMC;UE functional use;Balance;Decreased knowledge of precautions;Hearing;Vestibular;Body mechanics;Decreased knowledge of use of DME;Flexibility;IADL;Pain;Improper spinal/pelvic alignment;Strength;Edema;ROM    Rehab Potential Fair    Clinical Decision Making Several treatment options, min-mod task modification necessary    Comorbidities Affecting Occupational Performance: May have comorbidities impacting occupational performance    Modification or Assistance to Complete Evaluation  Min-Moderate modification of tasks or assist with assess necessary to complete eval    OT Frequency 2x / week    OT Duration 8 weeks    OT Treatment/Interventions Self-care/ADL training;Electrical Stimulation;Iontophoresis;Therapeutic exercise;Moist Heat;Neuromuscular education;Patient/family education;Energy conservation;Therapist, nutritional;Therapeutic activities;Cryotherapy;Ultrasound;DME and/or AE instruction;Manual Therapy;Passive range of motion    Plan check on ultrasound results with  patient from last session, continue ultrasound if good feedback, side lying shoulder exercises    Consulted and Agree with Plan of Care Patient           Patient will benefit from skilled therapeutic intervention in order to improve the following deficits and impairments:   Body Structure / Function / Physical Skills: ADL,Endurance,GMC,UE functional use,Balance,Decreased knowledge of precautions,Hearing,Vestibular,Body mechanics,Decreased knowledge  of use of DME,Flexibility,IADL,Pain,Improper spinal/pelvic alignment,Strength,Edema,ROM       Visit Diagnosis: Abnormal posture  Unsteadiness on feet  Other abnormalities of gait and mobility  Muscle weakness (generalized)  Acute pain of left shoulder  Stiffness of left shoulder, not elsewhere classified    Problem List Patient Active Problem List   Diagnosis Date Noted  . Disorder of left rotator cuff 08/08/2020  . Multiple myeloma not having achieved remission ([place]) 10/24/2015  . Dysphagia as late effect of stroke 07/10/2015  . Diarrhea 04/25/2015  . Cancer associated pain 04/25/2015  . Long term current use of anticoagulant therapy 04/25/2015  . Basal ganglia infarction ([facility]) 02/12/2015  . Dizziness   . Expressive aphasia   . Past pointing   . Right leg weakness   . History of stroke 02/11/2015  . CVA (cerebral infarction) 02/11/2015  . Aspiration pneumonia ([place]) 02/11/2015  . Aphasia   . Difficulty speaking   . Speech difficult to understand   . Encounter for antineoplastic chemotherapy 01/31/2015  . Constipation 12/20/2014  . Rash 12/20/2014  . Peripheral edema 12/06/2014  . Multifactorial gait disorder 09/05/2014  . Multiple myeloma ([facility] [facility])   . Paraplegia ([place]) 06/21/2014  . Postoperative anemia due to acute blood loss 06/21/2014  . 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 Spine metastasis ([place]) 06/17/2014  . Thoracic spine tumor 06/17/2014  . Metastatic bone cancer ([facility]) 06/15/2014    Zachery Conch  MOT, OTR/L  09/03/2020, 12:34 PM  [facility] [address] [place], [place], [address] Phone: [phone]   Fax:  [phone]  Name: Moses Odoherty MRN: [id number] Date of Birth: [birth year]

## 2020-09-03 NOTE — Therapy (Signed)
[facility] [address] [place], [place], [address] Phone: [phone]   Fax:  [phone]  Physical Therapy Treatment  Patient Details  Name: Jeremiah Owens MRN: [id number] Date of Birth: 09-14-1929 Referring Provider (PT): [name], MD   Encounter Date: 09/03/2020   PT End of Session - 09/03/20 1016    Visit Number 26    Number of Visits 27    Date for PT Re-Evaluation 10/12/20   updated POC for 4 weeks, Cert for 60 days   Authorization Type Medicare    Progress Note Due on Visit 30    PT Start Time 1016    PT Stop Time 1100    PT Time Calculation (min) 44 min    Equipment Utilized During Treatment Gait belt    Activity Tolerance Patient tolerated treatment well    Behavior During Therapy WFL for tasks assessed/performed           Past Medical History:  Diagnosis Date  . Allergy   . Anxiety   . Bone cancer ([place])    t_spine T-5  . Compression fracture   . Encounter for antineoplastic chemotherapy 01/31/2015  . Hearing loss   . Melanoma ([place])   . Multiple myeloma ([place])   . Prostate cancer ([place]) 2002  . S/P radiation therapy 08/21/14-09/04/14   T4-6 25Gy/5f  . S/P radiation therapy 11/29/14-12/12/14   rt prox humerus/shoulder/scapula 25Gy/155f . Skin cancer 1994   melanoma  right neck   . [place]  . Thyroid disease     Past Surgical History:  Procedure Laterality Date  . HERNIA REPAIR    . POSTERIOR CERVICAL FUSION/FORAMINOTOMY N/A 06/17/2014   Procedure: Thoracic five laminectomy for epidural tumor resection Thoracic 4-7 posterior lateral arthrodesis, segmental pedicle screw fixation.;  Surgeon: KyAshok PallMD;  [facility];  Service: Neurosurgery;  Laterality: N/A;    There were no vitals filed for this visit.   Subjective Assessment - 09/03/20 1019    Subjective No falls to report. No new changes/complaints. Patient reporting some pain in the L Shoulder.    Pertinent  History Anxiety, Multiple Myeloma with Thoracic Spinal METS, Stroke (L Basal Ganglia Infarct with R Hemiparesis, Thyroid Disease, Adhesive Capsulitis (L Shoulder), Dysphagia (d/t CVA    Patient Stated Goals get well; get balance back    Currently in Pain? Yes    Pain Score 8     Pain Location Shoulder    Pain Orientation Left    Pain Descriptors / Indicators Aching    Pain Type Acute pain    Pain Onset In the past 7 days    Pain Frequency Intermittent    Aggravating Factors  movement    Pain Relieving Factors rest                [facility] Adult PT Treatment/Exercise - 09/03/20 0001      Ambulation/Gait   Ambulation/Gait Yes    Ambulation/Gait Assistance 5: Supervision    Ambulation/Gait Assistance Details completed ambulation with rollator, focused on rollator safety. PT educating on proper usage of breaks to ensure safety. As well as working toward walking up close toward areas about to sit and turning. As patient demo turning earlier and require multiple backwards steps that challenge balance. Patient demonstrating and reporting understanding. With ambulation patient still require verbal cues for improved step length.    Ambulation Distance (Feet) 50 Feet    Assistive device Rollator    Gait Pattern  Step-through pattern;Decreased step length - right;Decreased step length - left;Decreased dorsiflexion - right;Decreased dorsiflexion - left;Right flexed knee in stance;Left flexed knee in stance;Trunk flexed    Ambulation Surface Level;Indoor      Exercises   Exercises Knee/Hip      Knee/Hip Exercises: Supine   Bridges Strengthening;Both;2 sets;10 reps    Bridges Limitations completed with yellow theraband, verbal cues for technique with completion.    Other Supine Knee/Hip Exercises Completed bent knee fall outs with yellow theraband 2 sets x 10 reps bilaterally. Verbal cues for technique with completion, and tactile cues to keep opposite lower extremity stable with completion.  Completed alteranting marching with yellow theraband around thighs, completed x 10 reps with verbal cues for slowed pace. Reviewed and added to HEP.              Reviewed Entire HEP and progressed as tolerated:  Access Code: [email or web address] URL: [email or web address] Date: 09/03/2020 Prepared by: Baldomero Lamy  Exercises Sit to Stand with Armchair - 1 x daily - 7 x weekly - 2 sets - 5 reps Seated Scapular Retraction - 2 x daily - 5 x weekly - 1 sets - 10 reps Romberg Stance - 1 x daily - 5 x weekly - 1 sets - 3 reps - 20 seconds hold Side to Side Weight Shift with Unilateral Counter Support - 1 x daily - 5 x weekly - 1 sets - 10 reps Wide Stance with Head Rotations and Unilateral Counter Support - 1 x daily - 5 x weekly - 1 sets - 10 reps Supine March with Resistance Band - 1 x daily - 5 x weekly - 1 sets - 10 reps Hooklying Single Leg Bent Knee Fallouts with Resistance - 1 x daily - 5 x weekly - 1 sets - 10 reps Supine Bridge with Resistance Band - 1 x daily - 5 x weekly - 1 sets - 10 reps   Attempted Hamstring Stretch Seated x 30 seconds bilaterally, increased SOB with completion. PT removed this from HEP due to concerns of SOB.        PT Education - 09/03/20 1057    Education Details Educated on Updated HEP; See Gait Training for Rollator Safety    Person(s) Educated Patient    Methods Explanation;Handout;Demonstration    Comprehension Verbalized understanding;Returned demonstration            PT Short Term Goals - 08/13/20 1124      PT SHORT TERM GOAL #1   Title = LTG             PT Long Term Goals - 08/13/20 1125      PT LONG TERM GOAL #1   Title Patient will report independence with final HEP for strengthening/balance/posture and daily walking program (ALL LTGs: 09/10/20)    Baseline patient reports independence, completing 2x/weekly    Time 4    Period Weeks    Status On-going    Target Date 09/10/20      PT LONG TERM GOAL #2   Title Patient  and caregiver will be independent with fall prevention strategies within the home to promote improved safety.    Baseline Dependent    Time 4    Period Weeks    Status New      PT LONG TERM GOAL #3   Title Pt will perform TUG test in </= 18 sconds to indicate decreased risk of falling    Baseline 24.75 secs; 21.53 secs, 21.21 secs  Time 4    Period Weeks    Status On-going      PT LONG TERM GOAL #4   Title Caregiver will verbalize and demonstrate ability to help patient from floor <> seated surface to promote improved safety within the home    Baseline dependent    Time 4    Period Weeks    Status New      PT LONG TERM GOAL #5   Title Patient will improve gait speed to >/= 2.5 ft/sec to demonstrate improved community ambulation    Baseline 1.67 ft/sec, 1.95 ft/sec, 2.25 ft/sec    Time 4    Period Weeks    Status On-going      PT LONG TERM GOAL #6   Title LTG to set for 5x sit <> stand test, to improve to less than or equal to 20 seconds for improved functional strength.    Baseline 27.35 sec from 18" chair with UE support; 21.90 secs    Time 4    Period Weeks    Status On-going                 Plan - 09/03/20 1111    Clinical Impression Statement Today's session focused on reviewing safety with rollator and brake management to promote improved safety within the home and community. Reviewed entire HEP and updated/progressed to patient's tolerance. Increased SOB with hamstring stretch therefore removed from HEP at this time. Will continue to progress. Plan to check all goals next  session and expected D/C from PT services at this time.    Personal Factors and Comorbidities Comorbidity 3+;Time since onset of injury/illness/exacerbation;Age    Comorbidities Anxiety, Multiple Myeloma with Thoracic Spinal METS, Stroke (L Basal Ganglia Infarct with R Hemiparesis, Thyroid Disease, Adhesive Capsulitis (L Shoulder), Dysphagia (d/t CVA)    Examination-Activity Limitations  Transfers;Stairs;Stand;Locomotion Level;Sit;Bend    Examination-Participation Restrictions Community Activity;Yard Work    [employer] Evolving/Moderate complexity    Rehab Potential Good    PT Frequency 1x / week    PT Duration 4 weeks    PT Treatment/Interventions ADLs/Self Care Home Management;Moist Heat;Cryotherapy;Therapeutic activities;DME Instruction;Gait training;Stair training;Functional mobility training;Therapeutic exercise;Balance training;Patient/family education;Orthotic Fit/Training;Neuromuscular re-education;Manual techniques;Passive range of motion;Vestibular    PT Next Visit Plan How was HEP? Check LTGs and D/C.    Consulted and Agree with Plan of Care Patient    Family Member Consulted Wife Kyra Manges)           Patient will benefit from skilled therapeutic intervention in order to improve the following deficits and impairments:  Abnormal gait,Decreased endurance,Decreased balance,Decreased mobility,Difficulty walking,Postural dysfunction,Pain,Decreased strength,Decreased knowledge of use of DME,Decreased activity tolerance,Decreased range of motion  Visit Diagnosis: Abnormal posture  Unsteadiness on feet  Other abnormalities of gait and mobility  Repeated falls  Muscle weakness (generalized)     Problem List Patient Active Problem List   Diagnosis Date Noted  . Disorder of left rotator cuff 08/08/2020  . Multiple myeloma not having achieved remission ([place]) 10/24/2015  . Dysphagia as late effect of stroke 07/10/2015  . Diarrhea 04/25/2015  . Cancer associated pain 04/25/2015  . Long term current use of anticoagulant therapy 04/25/2015  . Basal ganglia infarction ([facility]) 02/12/2015  . Dizziness   . Expressive aphasia   . Past pointing   . Right leg weakness   . History of stroke 02/11/2015  . CVA (cerebral infarction) 02/11/2015  . Aspiration pneumonia ([place] [facility]) 02/11/2015  . Aphasia   . Difficulty speaking   .  Speech difficult to  understand   . Encounter for antineoplastic chemotherapy 01/31/2015  . Constipation 12/20/2014  . Rash 12/20/2014  . Peripheral edema 12/06/2014  . Multifactorial gait disorder 09/05/2014  . Multiple myeloma ([place])   . Paraplegia ([place]) 06/21/2014  . Postoperative anemia due to acute blood loss 06/21/2014  . 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 Spine metastasis ([place]) 06/17/2014  . Thoracic spine tumor 06/17/2014  . Metastatic bone cancer ([place]) 06/15/2014    Jones Bales, PT, DPT 09/03/2020, 11:16 AM  [facility] [address] [place], [place], [address] Phone: [phone]   Fax:  [phone]  Name: Jeremiah Owens MRN: [id number] Date of Birth: [birth year]

## 2020-09-06 ENCOUNTER — Other Ambulatory Visit: Payer: Self-pay

## 2020-09-06 ENCOUNTER — Ambulatory Visit: Payer: Medicare Other | Attending: Physical Medicine & Rehabilitation | Admitting: Occupational Therapy

## 2020-09-06 ENCOUNTER — Encounter: Payer: Self-pay | Admitting: Occupational Therapy

## 2020-09-06 DIAGNOSIS — R293 Abnormal posture: Secondary | ICD-10-CM | POA: Insufficient documentation

## 2020-09-06 DIAGNOSIS — R2681 Unsteadiness on feet: Secondary | ICD-10-CM | POA: Diagnosis not present

## 2020-09-06 DIAGNOSIS — M25612 Stiffness of left shoulder, not elsewhere classified: Secondary | ICD-10-CM | POA: Diagnosis not present

## 2020-09-06 DIAGNOSIS — R2689 Other abnormalities of gait and mobility: Secondary | ICD-10-CM | POA: Insufficient documentation

## 2020-09-06 DIAGNOSIS — M6281 Muscle weakness (generalized): Secondary | ICD-10-CM | POA: Diagnosis not present

## 2020-09-06 DIAGNOSIS — M25512 Pain in left shoulder: Secondary | ICD-10-CM | POA: Insufficient documentation

## 2020-09-06 DIAGNOSIS — R296 Repeated falls: Secondary | ICD-10-CM | POA: Diagnosis not present

## 2020-09-06 NOTE — Therapy (Signed)
[facility] [address] [place], [place], [address] Phone: [phone]   Fax:  [phone]  Occupational Therapy Treatment  Patient Details  Name: Penn Burkhead MRN: [id number] Date of Birth: 05/30/1930 Referring Provider (OT): Swartz   Encounter Date: 09/06/2020   OT End of Session - 09/06/20 1353    Visit Number 6    Number of Visits 17    Date for OT Re-Evaluation 10/28/20    Authorization Type Medicare and [employer]    Authorization - Visit Number 5    Progress Note Due on Visit 10    OT Start Time 1146    OT Stop Time 1230    OT Time Calculation (min) 44 min    Activity Tolerance Patient tolerated treatment well    Behavior During Therapy WFL for tasks assessed/performed           Past Medical History:  Diagnosis Date  . Allergy   . Anxiety   . Bone cancer ([facility])    t_spine T-5  . Compression fracture   . Encounter for antineoplastic chemotherapy 01/31/2015  . Hearing loss   . Melanoma ([facility])   . Multiple myeloma ([facility])   . Prostate cancer ([facility]) 2002  . S/P radiation therapy 08/21/14-09/04/14   T4-6 25Gy/10fx  . S/P radiation therapy 11/29/14-12/12/14   rt prox humerus/shoulder/scapula 25Gy/10fx  . Skin cancer 1994   melanoma  right neck   . Stroke ([facility])   . Thyroid disease     Past Surgical History:  Procedure Laterality Date  . HERNIA REPAIR    . POSTERIOR CERVICAL FUSION/FORAMINOTOMY N/A 06/17/2014   Procedure: Thoracic five laminectomy for epidural tumor resection Thoracic 4-7 posterior lateral arthrodesis, segmental pedicle screw fixation.;  Surgeon: Kyle Cabbell, MD;  Location: [facility];  Service: Neurosurgery;  Laterality: N/A;    There were no vitals filed for this visit.   Subjective Assessment - 09/06/20 1228    Subjective  Patient indicates little change in arm pain since onset of OT    Currently in Pain? Yes    Pain Score 7     Pain Location Shoulder    Pain Orientation Left     Pain Descriptors / Indicators Aching    Pain Type Chronic pain    Pain Onset More than a month ago    Pain Frequency Intermittent    Aggravating Factors  Movement    Pain Relieving Factors rest                        OT Treatments/Exercises (OP) - 09/06/20 0001      Shoulder Exercises: ROM/Strengthening   Other ROM/Strengthening Exercises Body on arm exercise to promote small degrees of motion in left shoudler.  Standing at elevated mat table resting on forearms - using rocking motion in body to promote shoulder flexion/extension, horizontal abd/add in small ranges.      Ultrasound   Ultrasound Location LUE shoulder    Ultrasound Parameters continuous, 3mhz, 0.8 w/cm2 x 8 min    Ultrasound Goals Pain                    OT Short Term Goals - 09/06/20 1356      OT SHORT TERM GOAL #1   Title Patient will complete a self range of motion program for LUE    Time 4    Period Weeks    Status On-going    Target Date 09/28/20        OT SHORT TERM GOAL #2   Title Patient will demonstrate 40 degrees of active shoulder abduction without increase in pain symptoms    Baseline 35    Time 4    Period Weeks    Status On-going      OT SHORT TERM GOAL #3   Title Patient will demonstrate 55 degrees of active assisted shoulder flexion to aide with functional reach tasks    Baseline xx    Time 4    Period Weeks    Status On-going             OT Long Term Goals - 09/06/20 1356      OT LONG TERM GOAL #1   Title Patient will complete an updated HEP to address functional use and motion in LUE    Time 8    Period Weeks    Status New      OT LONG TERM GOAL #2   Title Patient will demonstrate 60 degrees of active assisted flexion or abduction to aide with dressing and functional reach tasks    Time 8    Period Weeks    Status New      OT LONG TERM GOAL #3   Title Patient will don his socks and shoes without physical assistance    Time 8    Period Weeks       OT LONG TERM GOAL #4   Title xxx    Baseline xxx      OT LONG TERM GOAL #5   Title xxx                 Plan - 09/06/20 1355    Clinical Impression Statement Patient indicates little change in shoulder pain overall.    OT Occupational Profile and History Detailed Assessment- Review of Records and additional review of physical, cognitive, psychosocial history related to current functional performance    Occupational performance deficits (Please refer to evaluation for details): ADL's;IADL's    Body Structure / Function / Physical Skills ADL;Endurance;GMC;UE functional use;Balance;Decreased knowledge of precautions;Hearing;Vestibular;Body mechanics;Decreased knowledge of use of DME;Flexibility;IADL;Pain;Improper spinal/pelvic alignment;Strength;Edema;ROM    Rehab Potential Fair    Clinical Decision Making Several treatment options, min-mod task modification necessary    Comorbidities Affecting Occupational Performance: May have comorbidities impacting occupational performance    Modification or Assistance to Complete Evaluation  Min-Moderate modification of tasks or assist with assess necessary to complete eval    OT Frequency 2x / week    OT Duration 8 weeks    OT Treatment/Interventions Self-care/ADL training;Electrical Stimulation;Iontophoresis;Therapeutic exercise;Moist Heat;Neuromuscular education;Patient/family education;Energy conservation;Therapist, nutritional;Therapeutic activities;Cryotherapy;Ultrasound;DME and/or AE instruction;Manual Therapy;Passive range of motion    Plan continue ultrasound if good feedback, side lying shoulder exercises, consider body on arm movement    Consulted and Agree with Plan of Care Patient           Patient will benefit from skilled therapeutic intervention in order to improve the following deficits and impairments:   Body Structure / Function / Physical Skills: ADL,Endurance,GMC,UE functional use,Balance,Decreased knowledge of  precautions,Hearing,Vestibular,Body mechanics,Decreased knowledge of use of DME,Flexibility,IADL,Pain,Improper spinal/pelvic alignment,Strength,Edema,ROM       Visit Diagnosis: Abnormal posture  Unsteadiness on feet  Muscle weakness (generalized)  Acute pain of left shoulder  Stiffness of left shoulder, not elsewhere classified    Problem List Patient Active Problem List   Diagnosis Date Noted  . Disorder of left rotator cuff 08/08/2020  . Multiple myeloma not having achieved remission ([place]) 10/24/2015  .  Dysphagia as late effect of stroke 07/10/2015  . Diarrhea 04/25/2015  . Cancer associated pain 04/25/2015  . Long term current use of anticoagulant therapy 04/25/2015  . Basal ganglia infarction ([facility]) 02/12/2015  . Dizziness   . Expressive aphasia   . Past pointing   . Right leg weakness   . History of stroke 02/11/2015  . CVA (cerebral infarction) 02/11/2015  . Aspiration pneumonia ([facility]) 02/11/2015  . Aphasia   . Difficulty speaking   . Speech difficult to understand   . Encounter for antineoplastic chemotherapy 01/31/2015  . Constipation 12/20/2014  . Rash 12/20/2014  . Peripheral edema 12/06/2014  . Multifactorial gait disorder 09/05/2014  . Multiple myeloma ([facility])   . Paraplegia ([facility]) 06/21/2014  . Postoperative anemia due to acute blood loss 06/21/2014  . 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 Spine metastasis ([facility]) 06/17/2014  . Thoracic spine tumor 06/17/2014  . Metastatic bone cancer ([facility]) 06/15/2014    ,  M, OTR/L 09/06/2020, 1:57 PM   [facility] [address] [place], [place], [address] Phone: [phone]   Fax:  [phone]  Name: Teejay Najarro MRN: [id number] Date of Birth: 08/24/1929 

## 2020-09-10 ENCOUNTER — Other Ambulatory Visit (HOSPITAL_COMMUNITY): Payer: Self-pay

## 2020-09-10 DIAGNOSIS — R1310 Dysphagia, unspecified: Secondary | ICD-10-CM

## 2020-09-10 DIAGNOSIS — R059 Cough, unspecified: Secondary | ICD-10-CM

## 2020-09-11 ENCOUNTER — Other Ambulatory Visit: Payer: Self-pay

## 2020-09-11 ENCOUNTER — Ambulatory Visit: Payer: Medicare Other | Admitting: Occupational Therapy

## 2020-09-11 ENCOUNTER — Ambulatory Visit: Payer: Medicare Other

## 2020-09-11 DIAGNOSIS — R293 Abnormal posture: Secondary | ICD-10-CM | POA: Diagnosis not present

## 2020-09-11 DIAGNOSIS — M6281 Muscle weakness (generalized): Secondary | ICD-10-CM | POA: Diagnosis not present

## 2020-09-11 DIAGNOSIS — M25512 Pain in left shoulder: Secondary | ICD-10-CM | POA: Diagnosis not present

## 2020-09-11 DIAGNOSIS — M25612 Stiffness of left shoulder, not elsewhere classified: Secondary | ICD-10-CM

## 2020-09-11 DIAGNOSIS — R2681 Unsteadiness on feet: Secondary | ICD-10-CM | POA: Diagnosis not present

## 2020-09-11 DIAGNOSIS — R296 Repeated falls: Secondary | ICD-10-CM | POA: Diagnosis not present

## 2020-09-11 DIAGNOSIS — R2689 Other abnormalities of gait and mobility: Secondary | ICD-10-CM

## 2020-09-11 NOTE — Therapy (Signed)
[facility] [address] [place], [place], [address] Phone: [phone]   Fax:  [phone]  Occupational Therapy Treatment  Patient Details  Name: Jeremiah Owens MRN: [id number] Date of Birth: Sep 12, 1929 Referring Provider (OT): Vickey Sages Date: 09/11/2020   OT End of Session - 09/11/20 1150    Visit Number 7    Number of Visits 17    Date for OT Re-Evaluation 10/28/20    Authorization Type Medicare and [employer]    Authorization - Visit Number 6    Progress Note Due on Visit 10    OT Start Time 1149    OT Stop Time 1230    OT Time Calculation (min) 41 min    Activity Tolerance Patient tolerated treatment well    Behavior During Therapy [facility] for tasks assessed/performed           Past Medical History:  Diagnosis Date  . Allergy   . Anxiety   . Bone cancer ([place])    t_spine T-5  . Compression fracture   . Encounter for antineoplastic chemotherapy 01/31/2015  . Hearing loss   . Melanoma ([place])   . Multiple myeloma ([place])   . Prostate cancer ([place]) 2002  . S/P radiation therapy 08/21/14-09/04/14   T4-6 25Gy/61f  . S/P radiation therapy 11/29/14-12/12/14   rt prox humerus/shoulder/scapula 25Gy/143f . Skin cancer 1994   melanoma  right neck   . [place] ([facility]  . Thyroid disease     Past Surgical History:  Procedure Laterality Date  . HERNIA REPAIR    . POSTERIOR CERVICAL FUSION/FORAMINOTOMY N/A 06/17/2014   Procedure: Thoracic five laminectomy for epidural tumor resection Thoracic 4-7 posterior lateral arthrodesis, segmental pedicle screw fixation.;  Surgeon: KyAshok PallMD;  [facility];  Service: Neurosurgery;  Laterality: N/A;    There were no vitals filed for this visit.                 OT Treatments/Exercises (OP) - 09/11/20 1159      Shoulder Exercises: ROM/Strengthening   Other ROM/Strengthening Exercises ROM of LUE with prolonged stretch on forearms on table during  moist heat. encouraged inching forward for forward reach/flexion x 3    Other ROM/Strengthening Exercises tabletop ROM with LUE while wiping with wash cloth table in all directions      Functional Reaching Activities   Low Level reaching with LUE on tabletop surface while seated for placing cylindrical pegs in semi circle pegboard      Moist Heat Therapy   Number Minutes Moist Heat 12 Minutes   did AROM on table for shoulder flexion and stretching during moist heat   Moist Heat Location Shoulder   LUE                 OT Education - 09/11/20 1226    Education Details table slides - see pt instructions    Person(s) Educated Patient    Methods Explanation;Demonstration;Handout    Comprehension Verbalized understanding;Returned demonstration            OT Short Term Goals - 09/11/20 1152      OT SHORT TERM GOAL #1   Title Patient will complete a self range of motion program for LUE    Time 4    Period Weeks    Status On-going    Target Date 09/28/20      OT SHORT TERM GOAL #2   Title Patient will demonstrate 40 degrees of  active shoulder abduction without increase in pain symptoms    Baseline 35    Time 4    Period Weeks    Status Achieved   40*     OT SHORT TERM GOAL #3   Title Patient will demonstrate 55 degrees of active assisted shoulder flexion to aide with functional reach tasks    Baseline 50    Time 4    Period Weeks    Status Achieved   55*            OT Long Term Goals - 09/11/20 1154      OT [place] #1   Title Patient will complete an updated HEP to address functional use and motion in LUE    Time 8    Period Weeks    Status On-going      OT LONG TERM GOAL #2   Title Patient will demonstrate 60 degrees of active assisted flexion or abduction to aide with dressing and functional reach tasks    Time 8    Period Weeks    Status On-going   40* abduction 55* flexion     OT LONG TERM GOAL #3   Title Patient will don his socks and shoes  without physical assistance    Time 8    Period Weeks    Status On-going   ongoing - pt reports he can do it with bigger socks     OT LONG TERM GOAL #4   Title xxx    Baseline xxx      OT LONG TERM GOAL #5   Title xxx                 Plan - 09/11/20 1210    Clinical Impression Statement Pt making slow progress. Pt reports little change in overall pain.    OT Occupational Profile and History Detailed Assessment- Review of Records and additional review of physical, cognitive, psychosocial history related to current functional performance    Occupational performance deficits (Please refer to evaluation for details): ADL's;IADL's    Body Structure / Function / Physical Skills ADL;Endurance;GMC;UE functional use;Balance;Decreased knowledge of precautions;Hearing;Vestibular;Body mechanics;Decreased knowledge of use of DME;Flexibility;IADL;Pain;Improper spinal/pelvic alignment;Strength;Edema;ROM    Rehab Potential Fair    Clinical Decision Making Several treatment options, min-mod task modification necessary    Comorbidities Affecting Occupational Performance: May have comorbidities impacting occupational performance    Modification or Assistance to Complete Evaluation  Min-Moderate modification of tasks or assist with assess necessary to complete eval    OT Frequency 2x / week    OT Duration 8 weeks    OT Treatment/Interventions Self-care/ADL training;Electrical Stimulation;Iontophoresis;Therapeutic exercise;Moist Heat;Neuromuscular education;Patient/family education;Energy conservation;Therapist, nutritional;Therapeutic activities;Cryotherapy;Ultrasound;DME and/or AE instruction;Manual Therapy;Passive range of motion    Plan side lying shoulder exercises, consider body on arm movement    Consulted and Agree with Plan of Care Patient           Patient will benefit from skilled therapeutic intervention in order to improve the following deficits and impairments:   Body  Structure / Function / Physical Skills: ADL,Endurance,GMC,UE functional use,Balance,Decreased knowledge of precautions,Hearing,Vestibular,Body mechanics,Decreased knowledge of use of DME,Flexibility,IADL,Pain,Improper spinal/pelvic alignment,Strength,Edema,ROM       Visit Diagnosis: Acute pain of left shoulder  Stiffness of left shoulder, not elsewhere classified  Muscle weakness (generalized)    Problem List Patient Active Problem List   Diagnosis Date Noted  . Disorder of left rotator cuff 08/08/2020  . Multiple myeloma not having achieved remission ([place])  10/24/2015  . Dysphagia as late effect of stroke 07/10/2015  . Diarrhea 04/25/2015  . Cancer associated pain 04/25/2015  . Long term current use of anticoagulant therapy 04/25/2015  . Basal ganglia infarction ([place]) 02/12/2015  . Dizziness   . Expressive aphasia   . Past pointing   . Right leg weakness   . History of stroke 02/11/2015  . CVA (cerebral infarction) 02/11/2015  . Aspiration pneumonia ([place]) 02/11/2015  . Aphasia   . Difficulty speaking   . Speech difficult to understand   . Encounter for antineoplastic chemotherapy 01/31/2015  . Constipation 12/20/2014  . Rash 12/20/2014  . Peripheral edema 12/06/2014  . Multifactorial gait disorder 09/05/2014  . Multiple myeloma ([place])   . Paraplegia ([place]) 06/21/2014  . Postoperative anemia due to acute blood loss 06/21/2014  . 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 Spine metastasis ([place]) 06/17/2014  . Thoracic spine tumor 06/17/2014  . Metastatic bone cancer ([place]) 06/15/2014    Zachery Conch MOT, OTR/L  09/11/2020, 12:27 PM  [facility] [address] [place], [place], [address] Phone: [phone]   Fax:  [phone]  Name: Dhiren Azimi MRN: [id number] Date of Birth: July 11, 1930

## 2020-09-11 NOTE — Therapy (Signed)
[facility] [address] [place], [place], [address] Phone: [phone]   Fax:  [phone]  Physical Therapy Treatment/Discharge Summary  Patient Details  Name: Jeremiah Owens MRN: [id number] Date of Birth: 1929/11/04 Referring Provider (PT): [name], MD  PHYSICAL THERAPY DISCHARGE SUMMARY  Visits from Start of Care: 27  Current functional level related to goals / functional outcomes: See Clinical Impression Statement   Remaining deficits: Abnormal Gait, Decreased Balance, Decreased Strength, Increased Fall Risk   Education / Equipment: HEP provided, Fall Prevention  Plan: Patient agrees to discharge.  Patient goals were partially met. Patient is being discharged due to lack of progress.  ?????         Encounter Date: 09/11/2020   PT End of Session - 09/11/20 1041    Visit Number 27    Number of Visits 27    Date for PT Re-Evaluation 10/12/20   updated POC for 4 weeks, Cert for 60 days   Authorization Type Medicare    Progress Note Due on Visit 30    PT Start Time 1016    PT Stop Time 1052    PT Time Calculation (min) 36 min    Equipment Utilized During Treatment Gait belt    Activity Tolerance Patient tolerated treatment well    Behavior During Therapy WFL for tasks assessed/performed           Past Medical History:  Diagnosis Date  . Allergy   . Anxiety   . Bone cancer ([place])    t_spine T-5  . Compression fracture   . Encounter for antineoplastic chemotherapy 01/31/2015  . Hearing loss   . Melanoma ([place])   . Multiple myeloma ([place])   . Prostate cancer ([place]) 2002  . S/P radiation therapy 08/21/14-09/04/14   T4-6 25Gy/36f  . S/P radiation therapy 11/29/14-12/12/14   rt prox humerus/shoulder/scapula 25Gy/188f . Skin cancer 1994   melanoma  right neck   . Stroke [place]  . Thyroid disease     Past Surgical History:  Procedure Laterality Date  . HERNIA REPAIR    . POSTERIOR CERVICAL  FUSION/FORAMINOTOMY N/A 06/17/2014   Procedure: Thoracic five laminectomy for epidural tumor resection Thoracic 4-7 posterior lateral arthrodesis, segmental pedicle screw fixation.;  Surgeon: KyAshok PallMD;  [facility];  Service: Neurosurgery;  Laterality: N/A;    There were no vitals filed for this visit.   Subjective Assessment - 09/11/20 1019    Subjective No new changes/complaints to report. No falls. No pain in shoulder currently.    Pertinent History Anxiety, Multiple Myeloma with Thoracic Spinal METS, Stroke (L Basal Ganglia Infarct with R Hemiparesis, Thyroid Disease, Adhesive Capsulitis (L Shoulder), Dysphagia (d/t CVA    Patient Stated Goals get well; get balance back    Currently in Pain? No/denies    Pain Onset In the past 7 days              [facility] - 09/11/20 0001      Transfers   Transfers Sit to Stand;Stand to Sit    Sit to Stand 5: Supervision;With upper extremity assist;From chair/3-in-1;With armrests    Five time sit to stand comments  21.87 secs with BUE, continues to decrease hesistancy to let go of arm rests, require verbal cues from PT.    Stand to Sit 5: Supervision;With upper extremity assist;With armrests;To chair/3-in-1      Ambulation/Gait   Ambulation/Gait Yes    Ambulation/Gait Assistance 5:  Supervision;4: Min guard    Ambulation/Gait Assistance Details completed ambulation around therapy gym x 150 ft with close supervision and intermittent CGA. patient require continued verbal cues for improved step length.    Ambulation Distance (Feet) 150 Feet    Assistive device Rollator    Gait Pattern Step-through pattern;Decreased step length - right;Decreased step length - left;Decreased dorsiflexion - right;Decreased dorsiflexion - left;Right flexed knee in stance;Left flexed knee in stance;Trunk flexed    Ambulation Surface Level;Indoor    Gait velocity 14.65 secs = 2.24 ft/sec      Standardized Balance Assessment    Standardized Balance Assessment Timed Up and Go Test      Timed Up and Go Test   TUG Normal TUG    Normal TUG (seconds) 22.85   with Rollator     Exercises   Exercises Other Exercises;Knee/Hip    Other Exercises  Completed verbal review of current HEP to address any questions/concerns. PT educating on importance of compliance to maintain gains achieved with PT services      Knee/Hip Exercises: Aerobic   Other Aerobic Completed SciFit on Level 1.5 x 10 minutes with BLE only for improved strengthening/reciprocal movement.            Access Code: [email or web address] URL: [email or web address] Date: 09/03/2020 Prepared by: Baldomero Lamy  Exercises Sit to Stand with Armchair - 1 x daily - 7 x weekly - 2 sets - 5 reps Seated Scapular Retraction - 2 x daily - 5 x weekly - 1 sets - 10 reps Romberg Stance - 1 x daily - 5 x weekly - 1 sets - 3 reps - 20 seconds hold Side to Side Weight Shift with Unilateral Counter Support - 1 x daily - 5 x weekly - 1 sets - 10 reps Wide Stance with Head Rotations and Unilateral Counter Support - 1 x daily - 5 x weekly - 1 sets - 10 reps Supine March with Resistance Band - 1 x daily - 5 x weekly - 1 sets - 10 reps Hooklying Single Leg Bent Knee Fallouts with Resistance - 1 x daily - 5 x weekly - 1 sets - 10 reps Supine Bridge with Resistance Band - 1 x daily - 5 x weekly - 1 sets - 10 reps     PT Short Term Goals - 08/13/20 1124      PT SHORT TERM GOAL #1   Title = LTG             PT Long Term Goals - 09/11/20 1021      PT LONG TERM GOAL #1   Title Patient will report independence with final HEP for strengthening/balance/posture and daily walking program (ALL LTGs: 09/10/20)    Baseline Patient reports independence, completing everyother day demosntrating improved compliance    Time 4    Period Weeks    Status Achieved      PT LONG TERM GOAL #2   Title Patient and caregiver will be independent with fall prevention strategies within the  home to promote improved safety.    Baseline Patient and caregiver educated on fall prevention within the home.    Time 4    Period Weeks    Status Achieved      PT LONG TERM GOAL #3   Title Pt will perform TUG test in </= 18 sconds to indicate decreased risk of falling    Baseline 24.75 secs; 21.53 secs, 21.21 secs; 22.85 secs (increased challenge with turn to chair noted)  Time 4    Period Weeks    Status Not Met      PT LONG TERM GOAL #4   Title Caregiver will verbalize and demonstrate ability to help patient from floor <> seated surface to promote improved safety within the home    Baseline able to verbalize ability to help patient floor <> seated surface    Time 4    Period Weeks    Status Partially Met      PT LONG TERM GOAL #5   Title Patient will improve gait speed to >/= 2.5 ft/sec to demonstrate improved community ambulation    Baseline 1.67 ft/sec, 1.95 ft/sec, 2.25 ft/sec; 2.24 ft/sec on 2/8    Time 4    Period Weeks    Status Not Met      PT LONG TERM GOAL #6   Title LTG to set for 5x sit <> stand test, to improve to less than or equal to 20 seconds for improved functional strength.    Baseline 27.35 sec from 18" chair with UE support; 21.90 secs; 21.87 secs with UE support    Time 4    Period Weeks    Status Not Met                 Plan - 09/11/20 1043    Clinical Impression Statement Today's skilled PT session focused on assesment of patient's progress toward LTG. Patient able to meet LTG #1, 2 and partially met LTG #4. Patient demo limited progress toward LTG #3,5 and 6. Compelted verbal review of current HEP and PT educating on importance of compliance. Patient is being discharged from PT services at this time due to lack of progress toward LTGs, and patient requesting to take a break from PT services. At this time with TUG score, Gait Speed, and 5x sit <> stand pateint is still at an increased risk for falls. PT educating on fall prevention to promote  improved safety within the home.    Personal Factors and Comorbidities Comorbidity 3+;Time since onset of injury/illness/exacerbation;Age    Comorbidities Anxiety, Multiple Myeloma with Thoracic Spinal METS, Stroke (L Basal Ganglia Infarct with R Hemiparesis, Thyroid Disease, Adhesive Capsulitis (L Shoulder), Dysphagia (d/t CVA)    Examination-Activity Limitations Transfers;Stairs;Stand;Locomotion Level;Sit;Bend    Examination-Participation Restrictions Community Activity;Yard Work    [employer] officer Evolving/Moderate complexity    Rehab Potential Good    PT Frequency 1x / week    PT Duration 4 weeks    PT Treatment/Interventions ADLs/Self Care Home Management;Moist Heat;Cryotherapy;Therapeutic activities;DME Instruction;Gait training;Stair training;Functional mobility training;Therapeutic exercise;Balance training;Patient/family education;Orthotic Fit/Training;Neuromuscular re-education;Manual techniques;Passive range of motion;Vestibular    Consulted and Agree with Plan of Care Patient    Family Member Consulted Wife Kyra Manges)           Patient will benefit from skilled therapeutic intervention in order to improve the following deficits and impairments:  Abnormal gait,Decreased endurance,Decreased balance,Decreased mobility,Difficulty walking,Postural dysfunction,Pain,Decreased strength,Decreased knowledge of use of DME,Decreased activity tolerance,Decreased range of motion  Visit Diagnosis: Abnormal posture  Repeated falls  Unsteadiness on feet  Muscle weakness (generalized)  Other abnormalities of gait and mobility     Problem List Patient Active Problem List   Diagnosis Date Noted  . Disorder of left rotator cuff 08/08/2020  . Multiple myeloma not having achieved remission ([place]) 10/24/2015  . Dysphagia as late effect of stroke 07/10/2015  . Diarrhea 04/25/2015  . Cancer associated pain 04/25/2015  . Long term current use of anticoagulant therapy  04/25/2015  . Basal ganglia infarction ([place]) 02/12/2015  . Dizziness   . Expressive aphasia   . Past pointing   . Right leg weakness   . History of stroke 02/11/2015  . CVA (cerebral infarction) 02/11/2015  . Aspiration pneumonia ([place]) 02/11/2015  . Aphasia   . Difficulty speaking   . Speech difficult to understand   . Encounter for antineoplastic chemotherapy 01/31/2015  . Constipation 12/20/2014  . Rash 12/20/2014  . Peripheral edema 12/06/2014  . Multifactorial gait disorder 09/05/2014  . Multiple myeloma ([place])   . Paraplegia ([place]) 06/21/2014  . Postoperative anemia due to acute blood loss 06/21/2014  . 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 Spine metastasis ([place]) 06/17/2014  . Thoracic spine tumor 06/17/2014  . Metastatic bone cancer ([place]) 06/15/2014    Jones Bales, PT, DPT 09/11/2020, 11:10 AM  [facility] [address] [place], [place], [address] Phone: [phone]   Fax:  [phone]  Name: Jeremiah Owens MRN: [id number] Date of Birth: 19-Apr-1930

## 2020-09-11 NOTE — Patient Instructions (Signed)
SHOULDER: Flexion On Table    Place hands on table, elbows straight. Move hips away from body. Press hands down into table. Hold _10 seconds. _10 reps per set, 2_ sets per day, _5-7_ days per week   Copyright  VHI. All rights reserved.

## 2020-09-12 ENCOUNTER — Encounter: Payer: Medicare Other | Attending: Physical Medicine & Rehabilitation | Admitting: Physical Medicine & Rehabilitation

## 2020-09-12 ENCOUNTER — Encounter: Payer: Self-pay | Admitting: Physical Medicine & Rehabilitation

## 2020-09-12 ENCOUNTER — Other Ambulatory Visit: Payer: Self-pay

## 2020-09-12 VITALS — BP 131/75 | HR 83 | Temp 98.0°F | Ht 69.0 in | Wt 154.0 lb

## 2020-09-12 DIAGNOSIS — I6381 Other cerebral infarction due to occlusion or stenosis of small artery: Secondary | ICD-10-CM

## 2020-09-12 DIAGNOSIS — I639 Cerebral infarction, unspecified: Secondary | ICD-10-CM | POA: Diagnosis not present

## 2020-09-12 DIAGNOSIS — I69391 Dysphagia following cerebral infarction: Secondary | ICD-10-CM | POA: Diagnosis not present

## 2020-09-12 DIAGNOSIS — M7502 Adhesive capsulitis of left shoulder: Secondary | ICD-10-CM | POA: Diagnosis not present

## 2020-09-12 DIAGNOSIS — M67912 Unspecified disorder of synovium and tendon, left shoulder: Secondary | ICD-10-CM | POA: Diagnosis not present

## 2020-09-12 NOTE — Patient Instructions (Signed)
PLEASE FEEL FREE TO CALL OUR OFFICE WITH ANY PROBLEMS OR QUESTIONS ([phone])      

## 2020-09-12 NOTE — Progress Notes (Signed)
Subjective:    Patient ID: Jeremiah Owens, male    DOB: 1929-10-02, 87 y.o.   MRN: [id number]  HPI   Jeremiah Owens is here in follow up of his left basal ganglia infarct and associated deficits. He has felt that SLP has really helped with coughing which is much less. He has an MBS scheduled for next week.   He hasn't made much progress with ADL's largely due to his left shoulder pain. PT has signed off d/t lack of progress overall. He uses his RW for balance. It's still a struggle to transfer.   He last saw Dr. Latanya Maudlin for his left shoulder a few years ago. I found no record of imaging in our database.   Pain Inventory Average Pain 7 Pain Right Now 0 My pain is intermittent and sharp  In the last 24 hours, has pain interfered with the following? General activity 2 Relation with others 0 Enjoyment of life 0 What TIME of day is your pain at its worst? night Sleep (in general) Good  Pain is worse with: some activites Pain improves with: heat/ice Relief from Meds: no pain medication  Family History  Problem Relation Age of Onset  . Cancer Mother        breast  . Stroke Mother    Social History   Socioeconomic History  . Marital status: Married    Spouse name: Not on file  . Number of children: Not on file  . Years of education: Not on file  . Highest education level: Not on file  Occupational History  . Not on file  Tobacco Use  . Smoking status: Never Smoker  . Smokeless tobacco: Never Used  Vaping Use  . Vaping Use: Never used  Substance and Sexual Activity  . Alcohol use: No    Alcohol/week: 0.0 standard drinks  . Drug use: No  . Sexual activity: Not on file  Other Topics Concern  . Not on file  Social History Narrative  . Not on file   Social Determinants of Health   Financial Resource Strain: Not on file  Food Insecurity: Not on file  Transportation Needs: Not on file  Physical Activity: Not on file  Stress: Not on file  Social Connections: Not on  file   Past Surgical History:  Procedure Laterality Date  . HERNIA REPAIR    . POSTERIOR CERVICAL FUSION/FORAMINOTOMY N/A 06/17/2014   Procedure: Thoracic five laminectomy for epidural tumor resection Thoracic 4-7 posterior lateral arthrodesis, segmental pedicle screw fixation.;  Surgeon: Ashok Pall, MD;  Location: [facility];  Service: Neurosurgery;  Laterality: N/A;   Past Surgical History:  Procedure Laterality Date  . HERNIA REPAIR    . POSTERIOR CERVICAL FUSION/FORAMINOTOMY N/A 06/17/2014   Procedure: Thoracic five laminectomy for epidural tumor resection Thoracic 4-7 posterior lateral arthrodesis, segmental pedicle screw fixation.;  Surgeon: Ashok Pall, MD;  Location: [facility];  Service: Neurosurgery;  Laterality: N/A;   Past Medical History:  Diagnosis Date  . Allergy   . Anxiety   . Bone cancer ([place])    t_spine T-5  . Compression fracture   . Encounter for antineoplastic chemotherapy 01/31/2015  . Hearing loss   . Melanoma ([place])   . Multiple myeloma ([place])   . Prostate cancer ([place]) 2002  . S/P radiation therapy 08/21/14-09/04/14   T4-6 25Gy/23f  . S/P radiation therapy 11/29/14-12/12/14   rt prox humerus/shoulder/scapula 25Gy/181f . Skin cancer 1994   melanoma  right neck   .  Stroke ([place])   . Thyroid disease    BP 131/75   Pulse 83   SpO2 97%   Opioid Risk Score:   Fall Risk Score:  `1  Depression screen PHQ 2/9  Depression screen PHQ 2/9 05/02/2020  Decreased Interest 0  Down, Depressed, Hopeless 0  PHQ - 2 Score 0  Altered sleeping 0  Tired, decreased energy 1  Change in appetite 0  Feeling bad or failure about yourself  0  Trouble concentrating 1  Moving slowly or fidgety/restless 0  Suicidal thoughts 0  PHQ-9 Score 2  Difficult doing work/chores Not difficult at all  Some recent data might be hidden    Review of Systems  Constitutional: Negative.   HENT: Negative.   Eyes: Negative.   Respiratory: Negative.   Cardiovascular: Negative.    Gastrointestinal: Negative.   Endocrine: Negative.   Genitourinary: Negative.   Musculoskeletal: Positive for arthralgias and gait problem.  Skin: Negative.   Allergic/Immunologic: Negative.   Neurological: Positive for weakness and numbness.       Tingling   Hematological: Negative.   Psychiatric/Behavioral: Negative.   All other systems reviewed and are negative.      Objective:   Physical Exam General: No acute distress HEENT: EOMI, oral membranes moist Cards: reg rate  Chest: normal effort Abdomen: Soft, NT, ND Skin: dry, intact Extremities: no edema Psych: pleasant and appropriate Neuro:  Alert and oriented x 3. Normal insight and awareness. Intact Memory. Normal language and speech. Cranial nerve exam unremarkable. Wet sounding voice. Posterior lean when standing. Needs extra time to stand from exam table.  RLE 4/5 LLE 4+/5. RUE 5/5. LUE still limited by shoulder pain.    Musculoskeletal: able to range left shoulder to about 70 degrees abduction and 60 flexion with pain.             Assessment & Plan:  1.  History of left basal ganglia infarct with right hemiparesis 2.  History of multiple myeloma with thoracic spinal mets 3.  History of BPH 4. Dysphagia d/t CVA 5.  Multifactorial gait disorder related to the above in addition to his significant kyphotic posture and adhesive capsulitis in the left shoulder.  Adversely affected by covid pandemic    Plan: 1.  Made referral to outpatient OT to address lert shoulder ROM, pain and to help establish a HEP.             -.After informed consent and preparation of the skin with betadine and isopropyl alcohol, I injected 56m (1cc) of celestone and 4cc of 1% lidocaine into the left subacromial space via lateral approach. Additionally, aspiration was performed prior to injection. The patient tolerated well, and no complications were encountered. Afterward the area was cleaned and dressed. Post- injection instructions were  provided.   2.  Continue with outpt OT, SLP. MBS next week 3.  RW for safety. Still a fall risk   15 minutes of direct patient time was spent in the office today.  I will see him back here in about 2 mos

## 2020-09-13 ENCOUNTER — Ambulatory Visit: Payer: Medicare Other | Admitting: Occupational Therapy

## 2020-09-13 ENCOUNTER — Encounter: Payer: Self-pay | Admitting: Occupational Therapy

## 2020-09-13 DIAGNOSIS — M6281 Muscle weakness (generalized): Secondary | ICD-10-CM | POA: Diagnosis not present

## 2020-09-13 DIAGNOSIS — M25512 Pain in left shoulder: Secondary | ICD-10-CM

## 2020-09-13 DIAGNOSIS — R296 Repeated falls: Secondary | ICD-10-CM | POA: Diagnosis not present

## 2020-09-13 DIAGNOSIS — R2681 Unsteadiness on feet: Secondary | ICD-10-CM | POA: Diagnosis not present

## 2020-09-13 DIAGNOSIS — M25612 Stiffness of left shoulder, not elsewhere classified: Secondary | ICD-10-CM | POA: Diagnosis not present

## 2020-09-13 DIAGNOSIS — R293 Abnormal posture: Secondary | ICD-10-CM | POA: Diagnosis not present

## 2020-09-13 NOTE — Therapy (Signed)
[facility] [address] [place], [place], [address] Phone: [phone]   Fax:  [phone]  Occupational Therapy Treatment  Patient Details  Name: Jeremiah Owens MRN: [id number] Date of Birth: 12/05/1929 Referring Provider (OT): Vickey Sages Date: 09/13/2020   OT End of Session - 09/13/20 1316    Visit Number 8    Number of Visits 17    Date for OT Re-Evaluation 10/28/20    Authorization Type Medicare and T[employer]    Authorization - Visit Number 7    Progress Note Due on Visit 10    OT Start Time 1314    OT Stop Time 1400   8 minutes of ice pack   OT Time Calculation (min) 46 min    Activity Tolerance Patient tolerated treatment well    Behavior During Therapy [facility] for tasks assessed/performed           Past Medical History:  Diagnosis Date  . Allergy   . Anxiety   . Bone cancer ([place])    t_spine T-5  . Compression fracture   . Encounter for antineoplastic chemotherapy 01/31/2015  . Hearing loss   . Melanoma ([place])   . Multiple myeloma ([place])   . Prostate cancer ([place]) 2002  . S/P radiation therapy 08/21/14-09/04/14   T4-6 25Gy/78f  . S/P radiation therapy 11/29/14-12/12/14   rt prox humerus/shoulder/scapula 25Gy/141f . Skin cancer 1994   melanoma  right neck   . Stroke ([place]  . Thyroid disease     Past Surgical History:  Procedure Laterality Date  . HERNIA REPAIR    . POSTERIOR CERVICAL FUSION/FORAMINOTOMY N/A 06/17/2014   Procedure: Thoracic five laminectomy for epidural tumor resection Thoracic 4-7 posterior lateral arthrodesis, segmental pedicle screw fixation.;  Surgeon: KyAshok PallMD;  [facility];  Service: Neurosurgery;  Laterality: N/A;    There were no vitals filed for this visit.   Subjective Assessment - 09/13/20 1317    Subjective  I only have the pain whenever I use it. He gave me a shot yesterday.    Currently in Pain? Yes    Pain Score 5     Pain Location Shoulder     Pain Orientation Left    Pain Descriptors / Indicators Aching    Pain Type Chronic pain    Pain Onset More than a month ago    Pain Frequency Intermittent    Aggravating Factors  movement                        OT Treatments/Exercises (OP) - 09/13/20 0001      Shoulder Exercises: Supine   Other Supine Exercises using unweighted cane x about 7 times for shoulder flexion, chest press x 3. Pt with excessive cracking in left shoulder and reports of some pain with movements. scapular retraction x 10 while supine this day for scapular stability, external rotation L x 10 with no reports of pain. abduction with L x 10      Shoulder Exercises: Seated   Other Seated Exercises seated edge of mat and reaching unweighted cane towards knees and back for increasing mobility in LUE shoulder and increasing range of motion - min reports of pain      Shoulder Exercises: Sidelying   Other Sidelying Exercises sidlying on left side with bodyweight on arm - sholder flexion/extension x 10 with good movement and no reports of pain. pt able to achieve approx 90*  shoulder flexion in this position      Modalities   Modalities Cryotherapy      Cryotherapy   Number Minutes Cryotherapy 8 Minutes    Cryotherapy Location Shoulder   LUE   Type of Cryotherapy Ice pack                    OT Short Term Goals - 09/11/20 1152      OT SHORT TERM GOAL #1   Title Patient will complete a self range of motion program for LUE    Time 4    Period Weeks    Status On-going    Target Date 09/28/20      OT SHORT TERM GOAL #2   Title Patient will demonstrate 40 degrees of active shoulder abduction without increase in pain symptoms    Baseline 35    Time 4    Period Weeks    Status Achieved   40*     OT SHORT TERM GOAL #3   Title Patient will demonstrate 55 degrees of active assisted shoulder flexion to aide with functional reach tasks    Baseline 50    Time 4    Period Weeks    Status  Achieved   55*            OT Long Term Goals - 09/13/20 1342      OT LONG TERM GOAL #1   Title Patient will complete an updated HEP to address functional use and motion in LUE    Time 8    Period Weeks    Status On-going      OT LONG TERM GOAL #2   Title Patient will demonstrate 60 degrees of active assisted flexion or abduction to aide with dressing and functional reach tasks    Time 8    Period Weeks    Status On-going   40* abduction 55* flexion     OT LONG TERM GOAL #3   Title Patient will don his socks and shoes without physical assistance    Time 8    Period Weeks    Status On-going   pt completed with supervisoin this day but reports challenging     OT LONG TERM GOAL #4   Title xxx    Baseline xxx      OT LONG TERM GOAL #5   Title xxx                 Plan - 09/13/20 1331    Clinical Impression Statement Pt received Cortison shot on 09/12/2020 with some improvement in overall pain in LUE.    OT Occupational Profile and History Detailed Assessment- Review of Records and additional review of physical, cognitive, psychosocial history related to current functional performance    Occupational performance deficits (Please refer to evaluation for details): ADL's;IADL's    Body Structure / Function / Physical Skills ADL;Endurance;GMC;UE functional use;Balance;Decreased knowledge of precautions;Hearing;Vestibular;Body mechanics;Decreased knowledge of use of DME;Flexibility;IADL;Pain;Improper spinal/pelvic alignment;Strength;Edema;ROM    Rehab Potential Fair    Clinical Decision Making Several treatment options, min-mod task modification necessary    Comorbidities Affecting Occupational Performance: May have comorbidities impacting occupational performance    Modification or Assistance to Complete Evaluation  Min-Moderate modification of tasks or assist with assess necessary to complete eval    OT Frequency 2x / week    OT Duration 8 weeks    OT Treatment/Interventions  Self-care/ADL training;Electrical Stimulation;Iontophoresis;Therapeutic exercise;Moist Heat;Neuromuscular education;Patient/family education;Energy conservation;Therapist, nutritional;Therapeutic activities;Cryotherapy;Ultrasound;DME and/or  AE instruction;Manual Therapy;Passive range of motion    Plan sidelying shoulder exercises, heat for pain    Consulted and Agree with Plan of Care Patient           Patient will benefit from skilled therapeutic intervention in order to improve the following deficits and impairments:   Body Structure / Function / Physical Skills: ADL,Endurance,GMC,UE functional use,Balance,Decreased knowledge of precautions,Hearing,Vestibular,Body mechanics,Decreased knowledge of use of DME,Flexibility,IADL,Pain,Improper spinal/pelvic alignment,Strength,Edema,ROM       Visit Diagnosis: Acute pain of left shoulder  Stiffness of left shoulder, not elsewhere classified  Muscle weakness (generalized)    Problem List Patient Active Problem List   Diagnosis Date Noted  . Disorder of left rotator cuff 08/08/2020  . Multiple myeloma not having achieved remission ([place]) 10/24/2015  . Dysphagia as late effect of stroke 07/10/2015  . Diarrhea 04/25/2015  . Cancer associated pain 04/25/2015  . Long term current use of anticoagulant therapy 04/25/2015  . Basal ganglia infarction ([place]) 02/12/2015  . Dizziness   . Expressive aphasia   . Past pointing   . Right leg weakness   . History of stroke 02/11/2015  . CVA (cerebral infarction) 02/11/2015  . Aspiration pneumonia ([place]) 02/11/2015  . Aphasia   . Difficulty speaking   . Speech difficult to understand   . Encounter for antineoplastic chemotherapy 01/31/2015  . Constipation 12/20/2014  . Rash 12/20/2014  . Peripheral edema 12/06/2014  . Multifactorial gait disorder 09/05/2014  . Multiple myeloma ([place])   . Paraplegia ([place]) 06/21/2014  . Postoperative anemia due to acute blood loss 06/21/2014  . Neoplasm of  thoracic spine 06/20/2014  . Spine metastasis ([place]) 06/17/2014  . Thoracic spine tumor 06/17/2014  . Metastatic bone cancer ([place]) 06/15/2014    Zachery Conch MOT, OTR/L  09/13/2020, 1:53 PM  [facility] [address] [place], [place], [address] Phone: [phone]   Fax:  [phone]  Name: Jeremiah Owens MRN: [id number] Date of Birth: 21-Dec-1929

## 2020-09-17 ENCOUNTER — Encounter (HOSPITAL_COMMUNITY): Payer: Self-pay

## 2020-09-17 ENCOUNTER — Ambulatory Visit (HOSPITAL_COMMUNITY)
Admission: RE | Admit: 2020-09-17 | Discharge: 2020-09-17 | Disposition: A | Discharge status: Home / Self Care | Payer: Medicare Other | Source: Ambulatory Visit | Attending: Physical Medicine & Rehabilitation | Admitting: Physical Medicine & Rehabilitation

## 2020-09-17 ENCOUNTER — Ambulatory Visit
Admission: RE | Admit: 2020-09-17 | Discharge: 2020-09-17 | Disposition: A | Discharge status: Home / Self Care | Payer: Medicare Other | Source: Ambulatory Visit | Attending: Physical Medicine & Rehabilitation | Admitting: Physical Medicine & Rehabilitation

## 2020-09-17 ENCOUNTER — Other Ambulatory Visit: Payer: Self-pay

## 2020-09-17 DIAGNOSIS — R059 Cough, unspecified: Secondary | ICD-10-CM | POA: Insufficient documentation

## 2020-09-17 DIAGNOSIS — E039 Hypothyroidism, unspecified: Secondary | ICD-10-CM | POA: Diagnosis not present

## 2020-09-17 DIAGNOSIS — I69391 Dysphagia following cerebral infarction: Secondary | ICD-10-CM

## 2020-09-17 DIAGNOSIS — R1310 Dysphagia, unspecified: Secondary | ICD-10-CM | POA: Insufficient documentation

## 2020-09-17 DIAGNOSIS — G8929 Other chronic pain: Secondary | ICD-10-CM | POA: Diagnosis not present

## 2020-09-17 DIAGNOSIS — M19012 Primary osteoarthritis, left shoulder: Secondary | ICD-10-CM | POA: Diagnosis not present

## 2020-09-17 DIAGNOSIS — M7502 Adhesive capsulitis of left shoulder: Secondary | ICD-10-CM

## 2020-09-18 ENCOUNTER — Ambulatory Visit: Payer: Medicare Other | Admitting: Occupational Therapy

## 2020-09-18 DIAGNOSIS — M25512 Pain in left shoulder: Secondary | ICD-10-CM | POA: Diagnosis not present

## 2020-09-18 DIAGNOSIS — M25612 Stiffness of left shoulder, not elsewhere classified: Secondary | ICD-10-CM

## 2020-09-18 DIAGNOSIS — R2681 Unsteadiness on feet: Secondary | ICD-10-CM | POA: Diagnosis not present

## 2020-09-18 DIAGNOSIS — M6281 Muscle weakness (generalized): Secondary | ICD-10-CM

## 2020-09-18 DIAGNOSIS — R293 Abnormal posture: Secondary | ICD-10-CM | POA: Diagnosis not present

## 2020-09-18 DIAGNOSIS — R296 Repeated falls: Secondary | ICD-10-CM | POA: Diagnosis not present

## 2020-09-18 NOTE — Therapy (Signed)
[facility] [address] [place], [place], [address] Phone: [phone]   Fax:  [phone]  Occupational Therapy Treatment & Discharge  Patient Details  Name: Jeremiah Owens MRN: [id number] Date of Birth: 1929-08-14 Referring Provider (OT): Vickey Sages Date: 09/18/2020   OT End of Session - 09/18/20 1153    Visit Number 9    Number of Visits 17    Date for OT Re-Evaluation 10/28/20    Authorization Type Medicare and T[employer]    Authorization - Visit Number 8    Progress Note Due on Visit 10    OT Start Time 1146    OT Stop Time 1217   d/c session   OT Time Calculation (min) 31 min    Activity Tolerance Patient tolerated treatment well    Behavior During Therapy [facility] for tasks assessed/performed           Past Medical History:  Diagnosis Date  . Allergy   . Anxiety   . Bone cancer ([place])    t_spine T-5  . Compression fracture   . Encounter for antineoplastic chemotherapy 01/31/2015  . Hearing loss   . Melanoma ([place])   . Multiple myeloma ([place])   . Prostate cancer ([place]) 2002  . S/P radiation therapy 08/21/14-09/04/14   T4-6 25Gy/1fx  . S/P radiation therapy 11/29/14-12/12/14   rt prox humerus/shoulder/scapula 25Gy/16fx  . Skin cancer 1994   melanoma  right neck   . Stroke ([place])   . Thyroid disease     Past Surgical History:  Procedure Laterality Date  . HERNIA REPAIR    . POSTERIOR CERVICAL FUSION/FORAMINOTOMY N/A 06/17/2014   Procedure: Thoracic five laminectomy for epidural tumor resection Thoracic 4-7 posterior lateral arthrodesis, segmental pedicle screw fixation.;  Surgeon: Ashok Pall, MD;  Location: [facility];  Service: Neurosurgery;  Laterality: N/A;    There were no vitals filed for this visit.   Subjective Assessment - 09/18/20 1153    Subjective  "My shoulder has been hurting" "I had all my tests done yesterday" Pt had MRI done yesterday with results in Epic -as follows - 1. Advanced  glenohumeral osteoarthritis.  2. Possible lesion involving the lateral aspect of the humeral head  with a partially sclerotic rim. Consider MRI evaluation    Currently in Pain? Yes    Pain Score 7     Pain Location Shoulder    Pain Orientation Left;Anterior    Pain Descriptors / Indicators Aching    Pain Type Chronic pain    Pain Onset More than a month ago    Pain Frequency Intermittent    Aggravating Factors  movement                  OCCUPATIONAL THERAPY DISCHARGE SUMMARY  Visits from Start of Care: 9  Current functional level related to goals / functional outcomes: Pt has met all STGs but with limited progress with LTGs. Pt demonstraes limited gains with range of motion and improvement with overall pain and functional use in LUE shoulder. Pt had recent MRI revealing advanced arthritis in LUE shoulder and possible sclerotic lesion. D/t nature of shoulder and long term disability with LUE shoulder, patient limited with overall progress.    Remaining deficits: Continues to have decreased Range of motion and significant pain in LUE shoulder    Education / Equipment: Self PROM and pendulum swings and table slides for LUE ROM  Plan: Patient agrees to discharge.  Patient goals were partially  met. Patient is being discharged due to being pleased with the current functional level.  ?????            OT Treatments/Exercises (OP) - 09/18/20 1209      Moist Heat Therapy   Number Minutes Moist Heat 12 Minutes   during gentle range of motion on table top with some reports of pain. - switched to static hold of LUE on table for prolonged stretch   Moist Heat Location Shoulder   LUE                   OT Short Term Goals - 09/18/20 1159      OT SHORT TERM GOAL #1   Title Patient will complete a self range of motion program for LUE    Time 4    Period Weeks    Status Achieved    Target Date 09/28/20      OT SHORT TERM GOAL #2   Title Patient will demonstrate 40  degrees of active shoulder abduction without increase in pain symptoms    Baseline 35    Time 4    Period Weeks    Status Achieved   40*     OT SHORT TERM GOAL #3   Title Patient will demonstrate 55 degrees of active assisted shoulder flexion to aide with functional reach tasks    Baseline 50    Time 4    Period Weeks    Status Achieved   55*            OT Long Term Goals - 09/18/20 1157      OT LONG TERM GOAL #1   Title Patient will complete an updated HEP to address functional use and motion in LUE    Time 8    Period Weeks    Status Not Met   limited by pain.     OT LONG TERM GOAL #2   Title Patient will demonstrate 60 degrees of active assisted flexion or abduction to aide with dressing and functional reach tasks    Time 8    Period Weeks    Status Not Met   40* abduction 55* flexion; 09/18/20 52* flex,     OT LONG TERM GOAL #3   Title Patient will don his socks and shoes without physical assistance    Time 8    Period Weeks    Status Achieved   pt completed with supervisoin this day but reports challenging     OT LONG TERM GOAL #4   Title xxx    Baseline xxx      OT LONG TERM GOAL #5   Title xxx                 Plan - 09/18/20 1202    Clinical Impression Statement Pt has had minimal improvement with LUE shoulder pain and range of motion and is ready to discharge from therapy. Pt with advanced arthritis in LUE shoulder impeding overall function and mobility. Pt has met all STGs and partially met LTGs.    OT Occupational Profile and History Detailed Assessment- Review of Records and additional review of physical, cognitive, psychosocial history related to current functional performance    Occupational performance deficits (Please refer to evaluation for details): ADL's;IADL's    Body Structure / Function / Physical Skills ADL;Endurance;GMC;UE functional use;Balance;Decreased knowledge of precautions;Hearing;Vestibular;Body mechanics;Decreased knowledge of  use of DME;Flexibility;IADL;Pain;Improper spinal/pelvic alignment;Strength;Edema;ROM    Rehab Potential Fair    Clinical  Decision Making Several treatment options, min-mod task modification necessary    Comorbidities Affecting Occupational Performance: May have comorbidities impacting occupational performance    Modification or Assistance to Complete Evaluation  Min-Moderate modification of tasks or assist with assess necessary to complete eval    OT Frequency 2x / week    OT Duration 8 weeks    OT Treatment/Interventions Self-care/ADL training;Electrical Stimulation;Iontophoresis;Therapeutic exercise;Moist Heat;Neuromuscular education;Patient/family education;Energy conservation;Therapist, nutritional;Therapeutic activities;Cryotherapy;Ultrasound;DME and/or AE instruction;Manual Therapy;Passive range of motion    Plan OT discharge    Consulted and Agree with Plan of Care Patient           Patient will benefit from skilled therapeutic intervention in order to improve the following deficits and impairments:   Body Structure / Function / Physical Skills: ADL,Endurance,GMC,UE functional use,Balance,Decreased knowledge of precautions,Hearing,Vestibular,Body mechanics,Decreased knowledge of use of DME,Flexibility,IADL,Pain,Improper spinal/pelvic alignment,Strength,Edema,ROM       Visit Diagnosis: Acute pain of left shoulder  Stiffness of left shoulder, not elsewhere classified  Muscle weakness (generalized)  Abnormal posture    Problem List Patient Active Problem List   Diagnosis Date Noted  . Disorder of left rotator cuff 08/08/2020  . Multiple myeloma not having achieved remission ([place]) 10/24/2015  . Dysphagia as late effect of stroke 07/10/2015  . Diarrhea 04/25/2015  . Cancer associated pain 04/25/2015  . Long term current use of anticoagulant therapy 04/25/2015  . Basal ganglia infarction ([place]) 02/12/2015  . Dizziness   . Expressive aphasia   . Past pointing   .  Right leg weakness   . History of stroke 02/11/2015  . CVA (cerebral infarction) 02/11/2015  . Aspiration pneumonia ([place]) 02/11/2015  . Aphasia   . Difficulty speaking   . Speech difficult to understand   . Encounter for antineoplastic chemotherapy 01/31/2015  . Constipation 12/20/2014  . Rash 12/20/2014  . Peripheral edema 12/06/2014  . Multifactorial gait disorder 09/05/2014  . Multiple myeloma ([place])   . Paraplegia ([place]) 06/21/2014  . Postoperative anemia due to acute blood loss 06/21/2014  . 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 Spine metastasis ([place]) 06/17/2014  . Thoracic spine tumor 06/17/2014  . Metastatic bone cancer ([place]) 06/15/2014    Zachery Conch MOT, OTR/L  09/18/2020, 12:26 PM  [facility] [address] [place], [place], [address] Phone: [phone]   Fax:  [phone]  Name: Jeremiah Owens MRN: [id number] Date of Birth: 05/25/1930

## 2020-09-20 ENCOUNTER — Ambulatory Visit: Payer: Medicare Other | Admitting: Occupational Therapy

## 2020-09-21 DIAGNOSIS — H02834 Dermatochalasis of left upper eyelid: Secondary | ICD-10-CM | POA: Diagnosis not present

## 2020-09-21 DIAGNOSIS — H16141 Punctate keratitis, right eye: Secondary | ICD-10-CM | POA: Diagnosis not present

## 2020-09-21 DIAGNOSIS — H04123 Dry eye syndrome of bilateral lacrimal glands: Secondary | ICD-10-CM | POA: Diagnosis not present

## 2020-09-21 DIAGNOSIS — Z961 Presence of intraocular lens: Secondary | ICD-10-CM | POA: Diagnosis not present

## 2020-09-21 DIAGNOSIS — H0102B Squamous blepharitis left eye, upper and lower eyelids: Secondary | ICD-10-CM | POA: Diagnosis not present

## 2020-09-21 DIAGNOSIS — H02831 Dermatochalasis of right upper eyelid: Secondary | ICD-10-CM | POA: Diagnosis not present

## 2020-09-21 DIAGNOSIS — H02535 Eyelid retraction left lower eyelid: Secondary | ICD-10-CM | POA: Diagnosis not present

## 2020-09-21 DIAGNOSIS — H02532 Eyelid retraction right lower eyelid: Secondary | ICD-10-CM | POA: Diagnosis not present

## 2020-09-21 DIAGNOSIS — H0102A Squamous blepharitis right eye, upper and lower eyelids: Secondary | ICD-10-CM | POA: Diagnosis not present

## 2020-09-25 ENCOUNTER — Ambulatory Visit: Payer: Medicare Other | Admitting: Occupational Therapy

## 2020-09-27 ENCOUNTER — Encounter: Payer: Medicare Other | Admitting: Occupational Therapy

## 2020-10-02 ENCOUNTER — Encounter: Payer: Medicare Other | Admitting: Occupational Therapy

## 2020-10-04 ENCOUNTER — Encounter: Payer: Medicare Other | Admitting: Occupational Therapy

## 2020-10-17 ENCOUNTER — Inpatient Hospital Stay: Payer: Medicare Other | Attending: Internal Medicine

## 2020-10-17 ENCOUNTER — Other Ambulatory Visit: Payer: Self-pay

## 2020-10-17 DIAGNOSIS — R2689 Other abnormalities of gait and mobility: Secondary | ICD-10-CM | POA: Diagnosis not present

## 2020-10-17 DIAGNOSIS — G629 Polyneuropathy, unspecified: Secondary | ICD-10-CM | POA: Diagnosis not present

## 2020-10-17 DIAGNOSIS — Z85828 Personal history of other malignant neoplasm of skin: Secondary | ICD-10-CM | POA: Insufficient documentation

## 2020-10-17 DIAGNOSIS — Z8673 Personal history of transient ischemic attack (TIA), and cerebral infarction without residual deficits: Secondary | ICD-10-CM | POA: Insufficient documentation

## 2020-10-17 DIAGNOSIS — Z85820 Personal history of malignant melanoma of skin: Secondary | ICD-10-CM | POA: Insufficient documentation

## 2020-10-17 DIAGNOSIS — C9000 Multiple myeloma not having achieved remission: Secondary | ICD-10-CM | POA: Insufficient documentation

## 2020-10-17 DIAGNOSIS — Z8546 Personal history of malignant neoplasm of prostate: Secondary | ICD-10-CM | POA: Insufficient documentation

## 2020-10-17 DIAGNOSIS — Z923 Personal history of irradiation: Secondary | ICD-10-CM | POA: Insufficient documentation

## 2020-10-17 DIAGNOSIS — C7951 Secondary malignant neoplasm of bone: Secondary | ICD-10-CM | POA: Diagnosis not present

## 2020-10-17 LAB — CBC WITH DIFFERENTIAL (CANCER CENTER ONLY)
Abs Immature Granulocytes: 0.02 10*3/uL (ref 0.00–0.07)
Basophils Absolute: 0 10*3/uL (ref 0.0–0.1)
Basophils Relative: 0 %
Eosinophils Absolute: 0.1 10*3/uL (ref 0.0–0.5)
Eosinophils Relative: 1 %
HCT: 36.6 % — ABNORMAL LOW (ref 39.0–52.0)
Hemoglobin: 12.2 g/dL — ABNORMAL LOW (ref 13.0–17.0)
Immature Granulocytes: 0 %
Lymphocytes Relative: 11 %
Lymphs Abs: 0.7 10*3/uL (ref 0.7–4.0)
MCH: 30.8 pg (ref 26.0–34.0)
MCHC: 33.3 g/dL (ref 30.0–36.0)
MCV: 92.4 fL (ref 80.0–100.0)
Monocytes Absolute: 0.5 10*3/uL (ref 0.1–1.0)
Monocytes Relative: 7 %
Neutro Abs: 5.2 10*3/uL (ref 1.7–7.7)
Neutrophils Relative %: 81 %
Platelet Count: 154 10*3/uL (ref 150–400)
RBC: 3.96 MIL/uL — ABNORMAL LOW (ref 4.22–5.81)
RDW: 14.2 % (ref 11.5–15.5)
WBC Count: 6.5 10*3/uL (ref 4.0–10.5)
nRBC: 0 % (ref 0.0–0.2)

## 2020-10-17 LAB — LACTATE DEHYDROGENASE: LDH: 172 U/L (ref 98–192)

## 2020-10-17 LAB — CMP (CANCER CENTER ONLY)
ALT: 13 U/L (ref 0–44)
AST: 17 U/L (ref 15–41)
Albumin: 3.9 g/dL (ref 3.5–5.0)
Alkaline Phosphatase: 67 U/L (ref 38–126)
Anion gap: 8 (ref 5–15)
BUN: 25 mg/dL — ABNORMAL HIGH (ref 8–23)
CO2: 26 mmol/L (ref 22–32)
Calcium: 9.2 mg/dL (ref 8.9–10.3)
Chloride: 105 mmol/L (ref 98–111)
Creatinine: 1.02 mg/dL (ref 0.61–1.24)
GFR, Estimated: >60 mL/min (ref >60)
Glucose, Bld: 100 mg/dL — ABNORMAL HIGH (ref 70–99)
Potassium: 4.3 mmol/L (ref 3.5–5.1)
Sodium: 139 mmol/L (ref 135–145)
Total Bilirubin: 0.5 mg/dL (ref 0.3–1.2)
Total Protein: 6.6 g/dL (ref 6.5–8.1)

## 2020-10-18 LAB — KAPPA/LAMBDA LIGHT CHAINS
Kappa free light chain: 16.5 mg/L (ref 3.3–19.4)
Kappa, lambda light chain ratio: 1.6 (ref 0.26–1.65)
Lambda free light chains: 10.3 mg/L (ref 5.7–26.3)

## 2020-10-18 LAB — IGG, IGA, IGM
IgA: 128 mg/dL (ref 61–437)
IgG (Immunoglobin G), Serum: 740 mg/dL (ref 603–1613)
IgM (Immunoglobulin M), Srm: 62 mg/dL (ref 15–143)

## 2020-10-18 LAB — BETA 2 MICROGLOBULIN, SERUM: Beta-2 Microglobulin: 3.2 mg/L — ABNORMAL HIGH (ref 0.6–2.4)

## 2020-10-23 DIAGNOSIS — S0501XA Injury of conjunctiva and corneal abrasion without foreign body, right eye, initial encounter: Secondary | ICD-10-CM | POA: Diagnosis not present

## 2020-10-23 DIAGNOSIS — H1045 Other chronic allergic conjunctivitis: Secondary | ICD-10-CM | POA: Diagnosis not present

## 2020-10-23 DIAGNOSIS — H0102A Squamous blepharitis right eye, upper and lower eyelids: Secondary | ICD-10-CM | POA: Diagnosis not present

## 2020-10-23 DIAGNOSIS — H16143 Punctate keratitis, bilateral: Secondary | ICD-10-CM | POA: Diagnosis not present

## 2020-10-23 DIAGNOSIS — H0102B Squamous blepharitis left eye, upper and lower eyelids: Secondary | ICD-10-CM | POA: Diagnosis not present

## 2020-10-23 DIAGNOSIS — H04123 Dry eye syndrome of bilateral lacrimal glands: Secondary | ICD-10-CM | POA: Diagnosis not present

## 2020-10-24 ENCOUNTER — Inpatient Hospital Stay: Payer: Medicare Other

## 2020-10-24 ENCOUNTER — Other Ambulatory Visit: Payer: Self-pay

## 2020-10-24 ENCOUNTER — Inpatient Hospital Stay (HOSPITAL_BASED_OUTPATIENT_CLINIC_OR_DEPARTMENT_OTHER): Payer: Medicare Other | Admitting: Internal Medicine

## 2020-10-24 VITALS — BP 130/70 | HR 92 | Temp 97.9°F | Resp 13 | Ht 69.0 in | Wt 152.5 lb

## 2020-10-24 DIAGNOSIS — C7951 Secondary malignant neoplasm of bone: Secondary | ICD-10-CM

## 2020-10-24 DIAGNOSIS — I639 Cerebral infarction, unspecified: Secondary | ICD-10-CM | POA: Diagnosis not present

## 2020-10-24 DIAGNOSIS — C9000 Multiple myeloma not having achieved remission: Secondary | ICD-10-CM

## 2020-10-24 DIAGNOSIS — Z85820 Personal history of malignant melanoma of skin: Secondary | ICD-10-CM | POA: Diagnosis not present

## 2020-10-24 DIAGNOSIS — Z8546 Personal history of malignant neoplasm of prostate: Secondary | ICD-10-CM | POA: Diagnosis not present

## 2020-10-24 DIAGNOSIS — G629 Polyneuropathy, unspecified: Secondary | ICD-10-CM | POA: Diagnosis not present

## 2020-10-24 DIAGNOSIS — R2689 Other abnormalities of gait and mobility: Secondary | ICD-10-CM | POA: Diagnosis not present

## 2020-10-24 MED ORDER — ZOLEDRONIC ACID 4 MG/100ML IV SOLN
INTRAVENOUS | Status: AC
  Filled 2020-10-24: qty 100

## 2020-10-24 MED ORDER — SODIUM CHLORIDE 0.9 % IV SOLN
INTRAVENOUS | Status: DC
  Filled 2020-10-24: qty 250

## 2020-10-24 MED ORDER — ZOLEDRONIC ACID 4 MG/100ML IV SOLN
4.0000 mg | Freq: Once | INTRAVENOUS | Status: AC
  Administered 2020-10-24: 4 mg via INTRAVENOUS

## 2020-10-24 NOTE — Progress Notes (Signed)
Per Dr Julien Nordmann ok to treat with zometa today using cmp from 7 days ago.

## 2020-10-24 NOTE — Progress Notes (Signed)
[place] Telephone:(336) [phone]   Fax:(336) [phone]  OFFICE PROGRESS NOTE  Wenda Low, MD [address] [place] [address]  DIAGNOSIS: Multiple myeloma diagnosed in November 2015.  PRIOR THERAPY: 1) Status post Thoracic five laminectomy for epidural tumor resection, Thoracic 4-7 posterior lateral arthrodesis,  segmental pedicle screw fixation T4-T7 Globus instrumentation under the care of Dr. Christella Noa on 06/18/2014. 2) Systemic chemotherapy with Velcade 1.3 MG/M2 subcutaneously weekly in addition to Decadron 40 mg by mouth on weekly basis. Status post 11 cycles. 3) palliative radiotherapy to the right proximal humerus, shoulder and scapula under the care of Dr. Lisbeth Renshaw completed on 12/12/2014. 4)  Systemic chemotherapy with Velcade 1.3 MG/M2 subcutaneously weekly, Decadron 40 mg by mouth weekly in addition to Revlimid 25 MG by mouth daily for 21 days every 4 weeks, status post 14 cycles.   CURRENT THERAPY: Zometa 4 mg IV every 12 weeks. First dose was given 11/01/2014.  INTERVAL HISTORY: Jeremiah Owens 87 y.o. male returns to the clinic today for follow-up visit accompanied by his wife.  The patient is feeling fine today with no concerning complaints except for the frequent falls secondary to imbalance of his gait and he is not using his walker as he is supposed to do.  He denied having any current chest pain, shortness of breath, cough or hemoptysis.  He denied having any fever or chills.  He has no nausea, vomiting, diarrhea or constipation.  He has no headache or visual changes.  He has no weight loss or night sweats.  The patient has repeat myeloma panel performed recently and he is here for evaluation and discussion of his lab results.   MEDICAL HISTORY: Past Medical History:  Diagnosis Date  . Allergy   . Anxiety   . Bone cancer ([place])    t_spine T-5  . Compression fracture   . Encounter for antineoplastic chemotherapy 01/31/2015  .  Hearing loss   . Melanoma ([place])   . Multiple myeloma ([place])   . Prostate cancer ([place]) 2002  . S/P radiation therapy 08/21/14-09/04/14   T4-6 25Gy/56f  . S/P radiation therapy 11/29/14-12/12/14   rt prox humerus/shoulder/scapula 25Gy/154f . Skin cancer 1994   melanoma  right neck   . Stroke ([place]  . Thyroid disease     ALLERGIES:  is allergic to iodine.  MEDICATIONS:  Current Outpatient Medications  Medication Sig Dispense Refill  . aspirin 81 MG EC tablet Take 81 mg by mouth daily.  0  . Calcium Carbonate-Vitamin D (CALTRATE 600+D PO) Take 600 mg by mouth 2 (two) times daily.    . cholecalciferol (VITAMIN D) 1000 UNITS tablet Take 1,000 Units by mouth at bedtime.     . diphenoxylate-atropine (LOMOTIL) 2.5-0.025 MG tablet Take 2 tablets by mouth 4 (four) times daily as needed for diarrhea or loose stools. 30 tablet 0  . erythromycin ophthalmic ointment SMARTSIG:In Eye(s)    . furosemide (LASIX) 20 MG tablet Take 20 mg by mouth daily. Pt takes 1 tablet daily except on Mon and Thurs takes 2 tablets    . hydrocortisone 2.5 % lotion Apply 1 application topically at bedtime as needed.    . Marland KitchenLOR-CON 10 10 MEQ tablet Take 10 mEq by mouth daily.    . Marland Kitchenevothyroxine (SYNTHROID) 125 MCG tablet 1 tablet in the morning on an empty stomach    . methocarbamol (ROBAXIN) 500 MG tablet TAKE 1 TABLET EVERY 6 HOURS AS NEEDED FOR MUSCLE  SPASMS 180 tablet 4  . Multiple Vitamins-Minerals (CENTRUM SILVER PO) Take 1 tablet by mouth daily.    Marland Kitchen omeprazole (PRILOSEC) 40 MG capsule Take 40 mg by mouth daily as needed (heartburn).     Marland Kitchen oxybutynin (DITROPAN) 5 MG tablet TK 1 T PO ONCE D    . tamsulosin (FLOMAX) 0.4 MG CAPS capsule Take 1 capsule (0.4 mg total) by mouth at bedtime. 30 capsule 3  . TEXACORT 2.5 % SOLN APP TO THE SCALP HS PRN  2   No current facility-administered medications for this visit.   Facility-Administered Medications Ordered in Other Visits  Medication Dose Route Frequency Provider Last  Rate Last Admin  . sodium chloride 0.9 % injection 10 mL  10 mL Intracatheter PRN Curt Bears, MD        SURGICAL HISTORY:  Past Surgical History:  Procedure Laterality Date  . HERNIA REPAIR    . POSTERIOR CERVICAL FUSION/FORAMINOTOMY N/A 06/17/2014   Procedure: Thoracic five laminectomy for epidural tumor resection Thoracic 4-7 posterior lateral arthrodesis, segmental pedicle screw fixation.;  Surgeon: Ashok Pall, MD;  Location: [facility];  Service: Neurosurgery;  Laterality: N/A;    REVIEW OF SYSTEMS:  A comprehensive review of systems was negative except for: Constitutional: positive for fatigue Musculoskeletal: positive for arthralgias and muscle weakness Neurological: positive for gait problems   PHYSICAL EXAMINATION: General appearance: alert, cooperative, fatigued and no distress Head: Normocephalic, without obvious abnormality, atraumatic Neck: no adenopathy, no JVD, supple, symmetrical, trachea midline and thyroid not enlarged, symmetric, no tenderness/mass/nodules Lymph nodes: Cervical, supraclavicular, and axillary nodes normal. Resp: clear to auscultation bilaterally Back: symmetric, no curvature. ROM normal. No CVA tenderness. Cardio: regular rate and rhythm, S1, S2 normal, no murmur, click, rub or gallop GI: soft, non-tender; bowel sounds normal; no masses,  no organomegaly Extremities: extremities normal, atraumatic, no cyanosis or edema  ECOG PERFORMANCE STATUS: 1 - Symptomatic but completely ambulatory  Blood pressure 130/70, pulse 92, temperature 97.9 F (36.6 C), temperature source Tympanic, resp. rate 13, height '5\' 9"'  (1.753 m), weight 152 lb 8 oz (69.2 kg), SpO2 100 %.  LABORATORY DATA: Lab Results  Component Value Date   WBC 6.5 10/17/2020   HGB 12.2 (L) 10/17/2020   HCT 36.6 (L) 10/17/2020   MCV 92.4 10/17/2020   PLT 154 10/17/2020      Chemistry      Component Value Date/Time   NA 139 10/17/2020 1013   NA 141 07/15/2017 0949   K 4.3  10/17/2020 1013   K 4.4 07/15/2017 0949   CL 105 10/17/2020 1013   CO2 26 10/17/2020 1013   CO2 27 07/15/2017 0949   BUN 25 (H) 10/17/2020 1013   BUN 18.0 07/15/2017 0949   CREATININE 1.02 10/17/2020 1013   CREATININE 1.1 07/15/2017 0949      Component Value Date/Time   CALCIUM 9.2 10/17/2020 1013   CALCIUM 9.2 07/15/2017 0949   ALKPHOS 67 10/17/2020 1013   ALKPHOS 56 07/15/2017 0949   AST 17 10/17/2020 1013   AST 20 07/15/2017 0949   ALT 13 10/17/2020 1013   ALT 16 07/15/2017 0949   BILITOT 0.5 10/17/2020 1013   BILITOT 0.61 07/15/2017 0949       RADIOGRAPHIC STUDIES: No results found.  ASSESSMENT AND PLAN:  This is a very pleasant 87 years old white male with history of multiple myeloma diagnosed in November 2015 status post several chemotherapy regimens as well as radiation. The patient is currently on observation and he is  feeling fine with no concerning complaints except for the frequent falls secondary to gait imbalance because of the foot drop and neuropathy. He had repeat myeloma panel performed recently.  I discussed the lab results with the patient and his wife and recommended for him to continue on observation in the absence of any evidence for disease progression. He will have Zometa infusion today and every 3 months for the bone disease. The patient will come back for follow-up visit in 6 months for evaluation with repeat myeloma panel. He was advised to call immediately if he has any concerning symptoms in the interval. The patient voices understanding of current disease status and treatment options and is in agreement with the current care plan.  All questions were answered. The patient knows to call the clinic with any problems, questions or concerns. We can certainly see the patient much sooner if necessary.  Disclaimer: This note was dictated with voice recognition software. Similar sounding words can inadvertently be transcribed and may not be corrected upon  review.

## 2020-10-24 NOTE — Patient Instructions (Signed)
Zoledronic Acid injection (Hypercalcemia, Oncology) What is this medicine? ZOLEDRONIC ACID (ZOE le dron ik AS id) lowers the amount of calcium loss from bone. It is used to treat too much calcium in your blood from cancer. It is also used to prevent complications of cancer that has spread to the bone. This medicine may be used for other purposes; ask your health care provider or pharmacist if you have questions. COMMON BRAND NAME(S): Zometa What should I tell my health care provider before I take this medicine? They need to know if you have any of these conditions:  aspirin-sensitive asthma  cancer, especially if you are receiving medicines used to treat cancer  dental disease or wear dentures  infection  kidney disease  receiving corticosteroids like dexamethasone or prednisone  an unusual or allergic reaction to zoledronic acid, other medicines, foods, dyes, or preservatives  pregnant or trying to get pregnant  breast-feeding How should I use this medicine? This medicine is for infusion into a vein. It is given by a health care professional in a hospital or clinic setting. Talk to your pediatrician regarding the use of this medicine in children. Special care may be needed. Overdosage: If you think you have taken too much of this medicine contact a poison control center or emergency room at once. NOTE: This medicine is only for you. Do not share this medicine with others. What if I miss a dose? It is important not to miss your dose. Call your doctor or health care professional if you are unable to keep an appointment. What may interact with this medicine?  certain antibiotics given by injection  NSAIDs, medicines for pain and inflammation, like ibuprofen or naproxen  some diuretics like bumetanide, furosemide  teriparatide  thalidomide This list may not describe all possible interactions. Give your health care provider a list of all the medicines, herbs, non-prescription  drugs, or dietary supplements you use. Also tell them if you smoke, drink alcohol, or use illegal drugs. Some items may interact with your medicine. What should I watch for while using this medicine? Visit your doctor or health care professional for regular checkups. It may be some time before you see the benefit from this medicine. Do not stop taking your medicine unless your doctor tells you to. Your doctor may order blood tests or other tests to see how you are doing. Women should inform their doctor if they wish to become pregnant or think they might be pregnant. There is a potential for serious side effects to an unborn child. Talk to your health care professional or pharmacist for more information. You should make sure that you get enough calcium and vitamin D while you are taking this medicine. Discuss the foods you eat and the vitamins you take with your health care professional. Some people who take this medicine have severe bone, joint, and/or muscle pain. This medicine may also increase your risk for jaw problems or a broken thigh bone. Tell your doctor right away if you have severe pain in your jaw, bones, joints, or muscles. Tell your doctor if you have any pain that does not go away or that gets worse. Tell your dentist and dental surgeon that you are taking this medicine. You should not have major dental surgery while on this medicine. See your dentist to have a dental exam and fix any dental problems before starting this medicine. Take good care of your teeth while on this medicine. Make sure you see your dentist for regular follow-up   appointments. What side effects may I notice from receiving this medicine? Side effects that you should report to your doctor or health care professional as soon as possible:  allergic reactions like skin rash, itching or hives, swelling of the face, lips, or tongue  anxiety, confusion, or depression  breathing problems  changes in vision  eye  pain  feeling faint or lightheaded, falls  jaw pain, especially after dental work  mouth sores  muscle cramps, stiffness, or weakness  redness, blistering, peeling or loosening of the skin, including inside the mouth  trouble passing urine or change in the amount of urine Side effects that usually do not require medical attention (report to your doctor or health care professional if they continue or are bothersome):  bone, joint, or muscle pain  constipation  diarrhea  fever  hair loss  irritation at site where injected  loss of appetite  nausea, vomiting  stomach upset  trouble sleeping  trouble swallowing  weak or tired This list may not describe all possible side effects. Call your doctor for medical advice about side effects. You may report side effects to FDA at 1-800-FDA-1088. Where should I keep my medicine? This drug is given in a hospital or clinic and will not be stored at home. NOTE: This sheet is a summary. It may not cover all possible information. If you have questions about this medicine, talk to your doctor, pharmacist, or health care provider.  2020 Elsevier/Gold Standard (2013-12-17 14:19:39)  

## 2020-11-14 ENCOUNTER — Encounter: Payer: Medicare Other | Attending: Physical Medicine & Rehabilitation | Admitting: Physical Medicine & Rehabilitation

## 2020-11-14 ENCOUNTER — Encounter: Payer: Self-pay | Admitting: Physical Medicine & Rehabilitation

## 2020-11-14 ENCOUNTER — Other Ambulatory Visit: Payer: Self-pay

## 2020-11-14 VITALS — BP 129/77 | HR 88 | Temp 97.9°F | Ht 69.0 in | Wt 155.0 lb

## 2020-11-14 DIAGNOSIS — I69391 Dysphagia following cerebral infarction: Secondary | ICD-10-CM | POA: Diagnosis not present

## 2020-11-14 DIAGNOSIS — I639 Cerebral infarction, unspecified: Secondary | ICD-10-CM | POA: Insufficient documentation

## 2020-11-14 DIAGNOSIS — I6381 Other cerebral infarction due to occlusion or stenosis of small artery: Secondary | ICD-10-CM

## 2020-11-14 NOTE — Progress Notes (Signed)
Subjective:    Patient ID: Jeremiah Owens, male    DOB: 09-07-1929, 87 y.o.   MRN: [id number]  HPI   Jeremiah Owens is here in follow-up of his basal ganglia infarct and associated gait deficits and dysphagia.  Since I saw him in February he has followed up with oncology.  He received a Zometa infusion which is scheduled every 3 months for bone disease associated with his multiple myeloma.  The left shoulder injection performed at last visit provided modest relief at most. He is working on some exercises thru OT and those he does at [employer] via [place]. His swallowing is stable. He was choked up a bit this weekend after breakfast but he takes his time to chew his food and swallow carefully.   He uses his walker for balance. There have been no falls.     Pain Inventory Average Pain 1 Pain Right Now 1 My pain is n/a  In the last 24 hours, has pain interfered with the following? General activity 1 Relation with others 0 Enjoyment of life 1 What TIME of day is your pain at its worst? varies Sleep (in general) Good  Pain is worse with: unsure Pain improves with: medication Relief from Meds: 1  Family History  Problem Relation Age of Onset  . Cancer Mother        breast  . Stroke Mother    Social History   Socioeconomic History  . Marital status: Married    Spouse name: Not on file  . Number of children: Not on file  . Years of education: Not on file  . Highest education level: Not on file  Occupational History  . Not on file  Tobacco Use  . Smoking status: Never Smoker  . Smokeless tobacco: Never Used  Vaping Use  . Vaping Use: Never used  Substance and Sexual Activity  . Alcohol use: No    Alcohol/week: 0.0 standard drinks  . Drug use: No  . Sexual activity: Not on file  Other Topics Concern  . Not on file  Social History Narrative  . Not on file   Social Determinants of Health   Financial Resource Strain: Not on file  Food Insecurity: Not on file   Transportation Needs: Not on file  Physical Activity: Not on file  Stress: Not on file  Social Connections: Not on file   Past Surgical History:  Procedure Laterality Date  . HERNIA REPAIR    . POSTERIOR CERVICAL FUSION/FORAMINOTOMY N/A 06/17/2014   Procedure: Thoracic five laminectomy for epidural tumor resection Thoracic 4-7 posterior lateral arthrodesis, segmental pedicle screw fixation.;  Surgeon: Ashok Pall, MD;  Location: [facility];  Service: Neurosurgery;  Laterality: N/A;   Past Surgical History:  Procedure Laterality Date  . HERNIA REPAIR    . POSTERIOR CERVICAL FUSION/FORAMINOTOMY N/A 06/17/2014   Procedure: Thoracic five laminectomy for epidural tumor resection Thoracic 4-7 posterior lateral arthrodesis, segmental pedicle screw fixation.;  Surgeon: Ashok Pall, MD;  Location: [facility];  Service: Neurosurgery;  Laterality: N/A;   Past Medical History:  Diagnosis Date  . Allergy   . Anxiety   . Bone cancer ([place])    t_spine T-5  . Compression fracture   . Encounter for antineoplastic chemotherapy 01/31/2015  . Hearing loss   . Melanoma ([place])   . Multiple myeloma ([place])   . Prostate cancer ([place]) 2002  . S/P radiation therapy 08/21/14-09/04/14   T4-6 25Gy/78f  . S/P radiation therapy 11/29/14-12/12/14  rt prox humerus/shoulder/scapula 25Gy/76f  . Skin cancer 1994   melanoma  right neck   . [place] ([place]   . Thyroid disease    BP 129/77   Pulse 88   Temp 97.9 F (36.6 C)   Ht '5\' 9"'  (1.753 m)   Wt 155 lb (70.3 kg)   SpO2 97%   BMI 22.89 kg/m   Opioid Risk Score:   Fall Risk Score:  `1  Depression screen PHQ 2/9  Depression screen PHQ 2/9 05/02/2020  Decreased Interest 0  Down, Depressed, Hopeless 0  PHQ - 2 Score 0  Altered sleeping 0  Tired, decreased energy 1  Change in appetite 0  Feeling bad or failure about yourself  0  Trouble concentrating 1  Moving slowly or fidgety/restless 0  Suicidal thoughts 0  PHQ-9 Score 2  Difficult doing  work/chores Not difficult at all  Some recent data might be hidden    Review of Systems  Constitutional: Negative.   HENT: Negative.   Eyes: Negative.   Respiratory: Negative.   Cardiovascular: Negative.   Gastrointestinal: Negative.   Endocrine: Negative.   Genitourinary: Negative.   Musculoskeletal: Positive for gait problem.  Skin: Negative.   Allergic/Immunologic: Negative.   Hematological: Negative.   Psychiatric/Behavioral: Negative.   All other systems reviewed and are negative.      Objective:   Physical Exam General: No acute distress HEENT: EOMI, oral membranes moist Cards: reg rate  Chest: normal effort Abdomen: Soft, NT, ND Skin: dry, intact Extremities: no edema Psych: pleasant and appropriate Neuro:  Alert and oriented x 3. Normal insight and awareness. Intact Memory. Normal language. Speech dysarthric. HOH. Cranial nerve exam unremarkable. Wet sounding voice. Posterior lean when standing.    RLE 4/5 LLE 4+/5. RUE 5/5. LUE still limited by shoulder pain. head forward/leaning gait with shuffling pattern.    Musculoskeletal: about 60-70 degrees of passive abduction with pain.            Assessment & Plan:  1.  History of left basal ganglia infarct with right hemiparesis 2.  History of multiple myeloma with thoracic spinal mets 3.  History of BPH 4. Dysphagia d/t CVA 5.  Multifactorial gait disorder related to the above in addition to his significant kyphotic posture and adhesive capsulitis in the left shoulder.  Adversely affected by covid pandemic    Plan: 1.  ROM/exercises at home  -tiger balm and biofreeze, other topicals for left shoulder pain  -don't see the benefit of another injection.  2.  safety with swallowing discussed 3.  RW for balane and safety   15 minutes of direct patient time was spent in the office today.  I will see him back here in about 6 mos

## 2020-11-14 NOTE — Patient Instructions (Addendum)
PLEASE FEEL FREE TO CALL OUR OFFICE WITH ANY PROBLEMS OR QUESTIONS ([phone])  TIGER BALM OR BIOFREEZE FOR YOU

## 2020-11-27 DIAGNOSIS — H16143 Punctate keratitis, bilateral: Secondary | ICD-10-CM | POA: Diagnosis not present

## 2020-11-27 DIAGNOSIS — H1045 Other chronic allergic conjunctivitis: Secondary | ICD-10-CM | POA: Diagnosis not present

## 2020-11-27 DIAGNOSIS — H0102A Squamous blepharitis right eye, upper and lower eyelids: Secondary | ICD-10-CM | POA: Diagnosis not present

## 2020-11-27 DIAGNOSIS — Z961 Presence of intraocular lens: Secondary | ICD-10-CM | POA: Diagnosis not present

## 2020-11-27 DIAGNOSIS — H02834 Dermatochalasis of left upper eyelid: Secondary | ICD-10-CM | POA: Diagnosis not present

## 2020-11-27 DIAGNOSIS — H0102B Squamous blepharitis left eye, upper and lower eyelids: Secondary | ICD-10-CM | POA: Diagnosis not present

## 2020-11-27 DIAGNOSIS — H04123 Dry eye syndrome of bilateral lacrimal glands: Secondary | ICD-10-CM | POA: Diagnosis not present

## 2020-11-27 DIAGNOSIS — H02831 Dermatochalasis of right upper eyelid: Secondary | ICD-10-CM | POA: Diagnosis not present

## 2020-12-25 DIAGNOSIS — Z85828 Personal history of other malignant neoplasm of skin: Secondary | ICD-10-CM | POA: Diagnosis not present

## 2020-12-25 DIAGNOSIS — L565 Disseminated superficial actinic porokeratosis (DSAP): Secondary | ICD-10-CM | POA: Diagnosis not present

## 2020-12-25 DIAGNOSIS — D1801 Hemangioma of skin and subcutaneous tissue: Secondary | ICD-10-CM | POA: Diagnosis not present

## 2020-12-25 DIAGNOSIS — E039 Hypothyroidism, unspecified: Secondary | ICD-10-CM | POA: Diagnosis not present

## 2020-12-25 DIAGNOSIS — Z8546 Personal history of malignant neoplasm of prostate: Secondary | ICD-10-CM | POA: Diagnosis not present

## 2020-12-25 DIAGNOSIS — I69351 Hemiplegia and hemiparesis following cerebral infarction affecting right dominant side: Secondary | ICD-10-CM | POA: Diagnosis not present

## 2020-12-25 DIAGNOSIS — C7951 Secondary malignant neoplasm of bone: Secondary | ICD-10-CM | POA: Diagnosis not present

## 2020-12-25 DIAGNOSIS — K219 Gastro-esophageal reflux disease without esophagitis: Secondary | ICD-10-CM | POA: Diagnosis not present

## 2020-12-25 DIAGNOSIS — D696 Thrombocytopenia, unspecified: Secondary | ICD-10-CM | POA: Diagnosis not present

## 2020-12-25 DIAGNOSIS — L821 Other seborrheic keratosis: Secondary | ICD-10-CM | POA: Diagnosis not present

## 2020-12-25 DIAGNOSIS — L570 Actinic keratosis: Secondary | ICD-10-CM | POA: Diagnosis not present

## 2020-12-25 DIAGNOSIS — C9000 Multiple myeloma not having achieved remission: Secondary | ICD-10-CM | POA: Diagnosis not present

## 2020-12-25 DIAGNOSIS — C439 Malignant melanoma of skin, unspecified: Secondary | ICD-10-CM | POA: Diagnosis not present

## 2020-12-25 DIAGNOSIS — D225 Melanocytic nevi of trunk: Secondary | ICD-10-CM | POA: Diagnosis not present

## 2021-01-20 ENCOUNTER — Encounter: Payer: Self-pay | Admitting: Internal Medicine

## 2021-01-22 ENCOUNTER — Other Ambulatory Visit: Payer: Self-pay

## 2021-01-22 DIAGNOSIS — C9000 Multiple myeloma not having achieved remission: Secondary | ICD-10-CM

## 2021-01-24 ENCOUNTER — Inpatient Hospital Stay: Payer: Medicare Other

## 2021-01-24 ENCOUNTER — Other Ambulatory Visit: Payer: Self-pay

## 2021-01-24 ENCOUNTER — Inpatient Hospital Stay: Payer: Medicare Other | Attending: Internal Medicine

## 2021-01-24 VITALS — BP 121/57 | HR 79 | Temp 98.2°F | Resp 16

## 2021-01-24 DIAGNOSIS — Z923 Personal history of irradiation: Secondary | ICD-10-CM | POA: Diagnosis not present

## 2021-01-24 DIAGNOSIS — C9000 Multiple myeloma not having achieved remission: Secondary | ICD-10-CM

## 2021-01-24 DIAGNOSIS — Z8673 Personal history of transient ischemic attack (TIA), and cerebral infarction without residual deficits: Secondary | ICD-10-CM | POA: Insufficient documentation

## 2021-01-24 DIAGNOSIS — R2689 Other abnormalities of gait and mobility: Secondary | ICD-10-CM | POA: Diagnosis not present

## 2021-01-24 DIAGNOSIS — Z85820 Personal history of malignant melanoma of skin: Secondary | ICD-10-CM | POA: Insufficient documentation

## 2021-01-24 DIAGNOSIS — C7951 Secondary malignant neoplasm of bone: Secondary | ICD-10-CM | POA: Diagnosis not present

## 2021-01-24 DIAGNOSIS — Z85828 Personal history of other malignant neoplasm of skin: Secondary | ICD-10-CM | POA: Insufficient documentation

## 2021-01-24 DIAGNOSIS — Z8546 Personal history of malignant neoplasm of prostate: Secondary | ICD-10-CM | POA: Diagnosis not present

## 2021-01-24 DIAGNOSIS — G629 Polyneuropathy, unspecified: Secondary | ICD-10-CM | POA: Insufficient documentation

## 2021-01-24 LAB — CBC WITH DIFFERENTIAL (CANCER CENTER ONLY)
Abs Immature Granulocytes: 0.02 K/uL (ref 0.00–0.07)
Basophils Absolute: 0 K/uL (ref 0.0–0.1)
Basophils Relative: 0 %
Eosinophils Absolute: 0.2 K/uL (ref 0.0–0.5)
Eosinophils Relative: 3 %
HCT: 37 % — ABNORMAL LOW (ref 39.0–52.0)
Hemoglobin: 12.1 g/dL — ABNORMAL LOW (ref 13.0–17.0)
Immature Granulocytes: 0 %
Lymphocytes Relative: 17 %
Lymphs Abs: 0.9 K/uL (ref 0.7–4.0)
MCH: 30.4 pg (ref 26.0–34.0)
MCHC: 32.7 g/dL (ref 30.0–36.0)
MCV: 93 fL (ref 80.0–100.0)
Monocytes Absolute: 0.5 K/uL (ref 0.1–1.0)
Monocytes Relative: 9 %
Neutro Abs: 3.8 K/uL (ref 1.7–7.7)
Neutrophils Relative %: 71 %
Platelet Count: 146 K/uL — ABNORMAL LOW (ref 150–400)
RBC: 3.98 MIL/uL — ABNORMAL LOW (ref 4.22–5.81)
RDW: 14.7 % (ref 11.5–15.5)
WBC Count: 5.4 K/uL (ref 4.0–10.5)
nRBC: 0 % (ref 0.0–0.2)

## 2021-01-24 LAB — CMP (CANCER CENTER ONLY)
ALT: 13 U/L (ref 0–44)
AST: 19 U/L (ref 15–41)
Albumin: 3.8 g/dL (ref 3.5–5.0)
Alkaline Phosphatase: 65 U/L (ref 38–126)
Anion gap: 11 (ref 5–15)
BUN: 25 mg/dL — ABNORMAL HIGH (ref 8–23)
CO2: 27 mmol/L (ref 22–32)
Calcium: 9.6 mg/dL (ref 8.9–10.3)
Chloride: 104 mmol/L (ref 98–111)
Creatinine: 1.13 mg/dL (ref 0.61–1.24)
GFR, Estimated: >60 mL/min (ref >60)
Glucose, Bld: 97 mg/dL (ref 70–99)
Potassium: 3.8 mmol/L (ref 3.5–5.1)
Sodium: 142 mmol/L (ref 135–145)
Total Bilirubin: 0.4 mg/dL (ref 0.3–1.2)
Total Protein: 6.8 g/dL (ref 6.5–8.1)

## 2021-01-24 LAB — LACTATE DEHYDROGENASE: LDH: 176 U/L (ref 98–192)

## 2021-01-24 MED ORDER — ZOLEDRONIC ACID 4 MG/100ML IV SOLN
INTRAVENOUS | Status: AC
  Filled 2021-01-24: qty 100

## 2021-01-24 MED ORDER — ZOLEDRONIC ACID 4 MG/100ML IV SOLN
4.0000 mg | Freq: Once | INTRAVENOUS | Status: AC
  Administered 2021-01-24: 4 mg via INTRAVENOUS

## 2021-01-24 MED ORDER — SODIUM CHLORIDE 0.9 % IV SOLN
Freq: Once | INTRAVENOUS | Status: AC
  Filled 2021-01-24: qty 250

## 2021-01-24 NOTE — Patient Instructions (Signed)

## 2021-01-25 LAB — IGG, IGA, IGM
IgA: 137 mg/dL (ref 61–437)
IgG (Immunoglobin G), Serum: 830 mg/dL (ref 603–1613)
IgM (Immunoglobulin M), Srm: 76 mg/dL (ref 15–143)

## 2021-01-25 LAB — KAPPA/LAMBDA LIGHT CHAINS
Kappa free light chain: 21.3 mg/L — ABNORMAL HIGH (ref 3.3–19.4)
Kappa, lambda light chain ratio: 1.68 — ABNORMAL HIGH (ref 0.26–1.65)
Lambda free light chains: 12.7 mg/L (ref 5.7–26.3)

## 2021-03-14 ENCOUNTER — Encounter: Payer: Self-pay | Admitting: Internal Medicine

## 2021-04-18 ENCOUNTER — Inpatient Hospital Stay: Payer: Medicare Other | Attending: Internal Medicine

## 2021-04-18 ENCOUNTER — Other Ambulatory Visit: Payer: Self-pay

## 2021-04-18 DIAGNOSIS — Z8673 Personal history of transient ischemic attack (TIA), and cerebral infarction without residual deficits: Secondary | ICD-10-CM | POA: Diagnosis not present

## 2021-04-18 DIAGNOSIS — G629 Polyneuropathy, unspecified: Secondary | ICD-10-CM | POA: Insufficient documentation

## 2021-04-18 DIAGNOSIS — M2550 Pain in unspecified joint: Secondary | ICD-10-CM | POA: Insufficient documentation

## 2021-04-18 DIAGNOSIS — C7951 Secondary malignant neoplasm of bone: Secondary | ICD-10-CM | POA: Diagnosis not present

## 2021-04-18 DIAGNOSIS — Z923 Personal history of irradiation: Secondary | ICD-10-CM | POA: Diagnosis not present

## 2021-04-18 DIAGNOSIS — Z8546 Personal history of malignant neoplasm of prostate: Secondary | ICD-10-CM | POA: Insufficient documentation

## 2021-04-18 DIAGNOSIS — C9000 Multiple myeloma not having achieved remission: Secondary | ICD-10-CM | POA: Insufficient documentation

## 2021-04-18 DIAGNOSIS — Z85828 Personal history of other malignant neoplasm of skin: Secondary | ICD-10-CM | POA: Diagnosis not present

## 2021-04-18 DIAGNOSIS — R5383 Other fatigue: Secondary | ICD-10-CM | POA: Insufficient documentation

## 2021-04-18 DIAGNOSIS — Z85820 Personal history of malignant melanoma of skin: Secondary | ICD-10-CM | POA: Diagnosis not present

## 2021-04-18 LAB — CBC WITH DIFFERENTIAL (CANCER CENTER ONLY)
Abs Immature Granulocytes: 0.01 10*3/uL (ref 0.00–0.07)
Basophils Absolute: 0 10*3/uL (ref 0.0–0.1)
Basophils Relative: 0 %
Eosinophils Absolute: 0.2 10*3/uL (ref 0.0–0.5)
Eosinophils Relative: 3 %
HCT: 38.3 % — ABNORMAL LOW (ref 39.0–52.0)
Hemoglobin: 12.1 g/dL — ABNORMAL LOW (ref 13.0–17.0)
Immature Granulocytes: 0 %
Lymphocytes Relative: 14 %
Lymphs Abs: 0.7 10*3/uL (ref 0.7–4.0)
MCH: 29.1 pg (ref 26.0–34.0)
MCHC: 31.6 g/dL (ref 30.0–36.0)
MCV: 92.1 fL (ref 80.0–100.0)
Monocytes Absolute: 0.4 10*3/uL (ref 0.1–1.0)
Monocytes Relative: 8 %
Neutro Abs: 3.9 10*3/uL (ref 1.7–7.7)
Neutrophils Relative %: 75 %
Platelet Count: 157 10*3/uL (ref 150–400)
RBC: 4.16 MIL/uL — ABNORMAL LOW (ref 4.22–5.81)
RDW: 14.9 % (ref 11.5–15.5)
WBC Count: 5.2 10*3/uL (ref 4.0–10.5)
nRBC: 0 % (ref 0.0–0.2)

## 2021-04-18 LAB — CMP (CANCER CENTER ONLY)
ALT: 15 U/L (ref 0–44)
AST: 20 U/L (ref 15–41)
Albumin: 4.1 g/dL (ref 3.5–5.0)
Alkaline Phosphatase: 80 U/L (ref 38–126)
Anion gap: 8 (ref 5–15)
BUN: 23 mg/dL (ref 8–23)
CO2: 29 mmol/L (ref 22–32)
Calcium: 9.7 mg/dL (ref 8.9–10.3)
Chloride: 104 mmol/L (ref 98–111)
Creatinine: 1.18 mg/dL (ref 0.61–1.24)
GFR, Estimated: 59 mL/min — ABNORMAL LOW (ref >60)
Glucose, Bld: 84 mg/dL (ref 70–99)
Potassium: 4.2 mmol/L (ref 3.5–5.1)
Sodium: 141 mmol/L (ref 135–145)
Total Bilirubin: 0.5 mg/dL (ref 0.3–1.2)
Total Protein: 7.1 g/dL (ref 6.5–8.1)

## 2021-04-18 LAB — LACTATE DEHYDROGENASE: LDH: 186 U/L (ref 98–192)

## 2021-04-19 LAB — KAPPA/LAMBDA LIGHT CHAINS
Kappa free light chain: 22.2 mg/L — ABNORMAL HIGH (ref 3.3–19.4)
Kappa, lambda light chain ratio: 1.72 — ABNORMAL HIGH (ref 0.26–1.65)
Lambda free light chains: 12.9 mg/L (ref 5.7–26.3)

## 2021-04-19 LAB — IGG, IGA, IGM
IgA: 136 mg/dL (ref 61–437)
IgG (Immunoglobin G), Serum: 908 mg/dL (ref 603–1613)
IgM (Immunoglobulin M), Srm: 76 mg/dL (ref 15–143)

## 2021-04-19 LAB — BETA 2 MICROGLOBULIN, SERUM: Beta-2 Microglobulin: 2.9 mg/L — ABNORMAL HIGH (ref 0.6–2.4)

## 2021-04-25 ENCOUNTER — Inpatient Hospital Stay (HOSPITAL_BASED_OUTPATIENT_CLINIC_OR_DEPARTMENT_OTHER): Payer: Medicare Other | Admitting: Internal Medicine

## 2021-04-25 ENCOUNTER — Other Ambulatory Visit: Payer: Self-pay

## 2021-04-25 ENCOUNTER — Encounter: Payer: Self-pay | Admitting: Internal Medicine

## 2021-04-25 ENCOUNTER — Inpatient Hospital Stay: Payer: Medicare Other

## 2021-04-25 VITALS — Temp 98.4°F

## 2021-04-25 VITALS — BP 145/74 | HR 91 | Resp 18 | Wt 149.4 lb

## 2021-04-25 DIAGNOSIS — M2550 Pain in unspecified joint: Secondary | ICD-10-CM | POA: Diagnosis not present

## 2021-04-25 DIAGNOSIS — Z85820 Personal history of malignant melanoma of skin: Secondary | ICD-10-CM | POA: Diagnosis not present

## 2021-04-25 DIAGNOSIS — C9000 Multiple myeloma not having achieved remission: Secondary | ICD-10-CM | POA: Diagnosis not present

## 2021-04-25 DIAGNOSIS — C7951 Secondary malignant neoplasm of bone: Secondary | ICD-10-CM | POA: Diagnosis not present

## 2021-04-25 DIAGNOSIS — R5383 Other fatigue: Secondary | ICD-10-CM | POA: Diagnosis not present

## 2021-04-25 DIAGNOSIS — G629 Polyneuropathy, unspecified: Secondary | ICD-10-CM | POA: Diagnosis not present

## 2021-04-25 MED ORDER — ZOLEDRONIC ACID 4 MG/100ML IV SOLN
4.0000 mg | Freq: Once | INTRAVENOUS | Status: AC
  Administered 2021-04-25: 4 mg via INTRAVENOUS
  Filled 2021-04-25: qty 100

## 2021-04-25 MED ORDER — SODIUM CHLORIDE 0.9 % IV SOLN: Freq: Once | INTRAVENOUS | Status: AC

## 2021-04-25 NOTE — Progress Notes (Signed)
[place] Telephone:(336) [phone]   Fax:(336) [phone]  OFFICE PROGRESS NOTE  Wenda Low, MD [address] [place] [address]  DIAGNOSIS: Multiple myeloma diagnosed in November 2015.  PRIOR THERAPY: 1) Status post Thoracic five laminectomy for epidural tumor resection, Thoracic 4-7 posterior lateral arthrodesis,   segmental pedicle screw fixation T4-T7 Globus instrumentation under the care of Dr. Christella Noa on 06/18/2014. 2) Systemic chemotherapy with Velcade 1.3 MG/M2 subcutaneously weekly in addition to Decadron 40 mg by mouth on weekly basis. Status post 11 cycles. 3) palliative radiotherapy to the right proximal humerus, shoulder and scapula under the care of Dr. Lisbeth Renshaw completed on 12/12/2014. 4)  Systemic chemotherapy with Velcade 1.3 MG/M2 subcutaneously weekly, Decadron 40 mg by mouth weekly in addition to Revlimid 25 MG by mouth daily for 21 days every 4 weeks, status post 14 cycles.   CURRENT THERAPY: Zometa 4 mg IV every 12 weeks. First dose was given 11/01/2014.  INTERVAL HISTORY: Jeremiah Owens 86 y.o. male returns to the clinic today for follow-up visit accompanied by his wife.  The patient is feeling fine today with no concerning complaints except for the generalized fatigue and arthralgia.  He denied having any current chest pain, shortness of breath, cough or hemoptysis.  He denied having any fever or chills.  He has no nausea, vomiting, diarrhea or constipation.  He has no headache or visual changes.  Is here today for evaluation with repeat myeloma panel.  MEDICAL HISTORY: Past Medical History:  Diagnosis Date   Allergy    Anxiety    Bone cancer ([place])    t_spine T-5   Compression fracture    Encounter for antineoplastic chemotherapy 01/31/2015   Hearing loss    Melanoma ([place])    Multiple myeloma ([place])    Prostate cancer ([place]) 2002   S/P radiation therapy 08/21/14-09/04/14   T4-6 25Gy/38fx   S/P radiation therapy  11/29/14-12/12/14   rt prox humerus/shoulder/scapula 25Gy/59fx   Skin cancer 1994   melanoma  right neck    Stroke ([place])    Thyroid disease     ALLERGIES:  is allergic to iodine.  MEDICATIONS:  Current Outpatient Medications  Medication Sig Dispense Refill   aspirin 81 MG EC tablet Take 81 mg by mouth daily.  0   Calcium Carbonate-Vitamin D (CALTRATE 600+D PO) Take 600 mg by mouth 2 (two) times daily.     cholecalciferol (VITAMIN D) 1000 UNITS tablet Take 1,000 Units by mouth at bedtime.      diphenoxylate-atropine (LOMOTIL) 2.5-0.025 MG tablet Take 2 tablets by mouth 4 (four) times daily as needed for diarrhea or loose stools. 30 tablet 0   erythromycin ophthalmic ointment SMARTSIG:In Eye(s)     furosemide (LASIX) 20 MG tablet Take 20 mg by mouth daily. Pt takes 1 tablet daily except on Mon and Thurs takes 2 tablets     hydrocortisone 2.5 % lotion Apply 1 application topically at bedtime as needed.     KLOR-CON 10 10 MEQ tablet Take 10 mEq by mouth daily.     levothyroxine (SYNTHROID) 125 MCG tablet 1 tablet in the morning on an empty stomach     methocarbamol (ROBAXIN) 500 MG tablet TAKE 1 TABLET EVERY 6 HOURS AS NEEDED FOR MUSCLE SPASMS 180 tablet 4   Multiple Vitamins-Minerals (CENTRUM SILVER PO) Take 1 tablet by mouth daily.     omeprazole (PRILOSEC) 40 MG capsule Take 40 mg by mouth daily as needed (heartburn).  oxybutynin (DITROPAN) 5 MG tablet TK 1 T PO ONCE D     tamsulosin (FLOMAX) 0.4 MG CAPS capsule Take 1 capsule (0.4 mg total) by mouth at bedtime. 30 capsule 3   TEXACORT 2.5 % SOLN APP TO THE SCALP HS PRN  2   No current facility-administered medications for this visit.   Facility-Administered Medications Ordered in Other Visits  Medication Dose Route Frequency Provider Last Rate Last Admin   sodium chloride 0.9 % injection 10 mL  10 mL Intracatheter PRN Curt Bears, MD        SURGICAL HISTORY:  Past Surgical History:  Procedure Laterality Date   HERNIA  REPAIR     POSTERIOR CERVICAL FUSION/FORAMINOTOMY N/A 06/17/2014   Procedure: Thoracic five laminectomy for epidural tumor resection Thoracic 4-7 posterior lateral arthrodesis, segmental pedicle screw fixation.;  Surgeon: Ashok Pall, MD;  Location: [facility];  Service: Neurosurgery;  Laterality: N/A;    REVIEW OF SYSTEMS:  A comprehensive review of systems was negative except for: Constitutional: positive for fatigue Musculoskeletal: positive for arthralgias and muscle weakness   PHYSICAL EXAMINATION: General appearance: alert, cooperative, fatigued, and no distress Head: Normocephalic, without obvious abnormality, atraumatic Neck: no adenopathy, no JVD, supple, symmetrical, trachea midline, and thyroid not enlarged, symmetric, no tenderness/mass/nodules Lymph nodes: Cervical, supraclavicular, and axillary nodes normal. Resp: clear to auscultation bilaterally Back: symmetric, no curvature. ROM normal. No CVA tenderness. Cardio: regular rate and rhythm, S1, S2 normal, no murmur, click, rub or gallop GI: soft, non-tender; bowel sounds normal; no masses,  no organomegaly Extremities: extremities normal, atraumatic, no cyanosis or edema  ECOG PERFORMANCE STATUS: 1 - Symptomatic but completely ambulatory  Blood pressure (!) 145/74, pulse 91, resp. rate 18, weight 149 lb 7 oz (67.8 kg), SpO2 100 %.  LABORATORY DATA: Lab Results  Component Value Date   WBC 5.2 04/18/2021   HGB 12.1 (L) 04/18/2021   HCT 38.3 (L) 04/18/2021   MCV 92.1 04/18/2021   PLT 157 04/18/2021      Chemistry      Component Value Date/Time   NA 141 04/18/2021 1125   NA 141 07/15/2017 0949   K 4.2 04/18/2021 1125   K 4.4 07/15/2017 0949   CL 104 04/18/2021 1125   CO2 29 04/18/2021 1125   CO2 27 07/15/2017 0949   BUN 23 04/18/2021 1125   BUN 18.0 07/15/2017 0949   CREATININE 1.18 04/18/2021 1125   CREATININE 1.1 07/15/2017 0949      Component Value Date/Time   CALCIUM 9.7 04/18/2021 1125   CALCIUM 9.2  07/15/2017 0949   ALKPHOS 80 04/18/2021 1125   ALKPHOS 56 07/15/2017 0949   AST 20 04/18/2021 1125   AST 20 07/15/2017 0949   ALT 15 04/18/2021 1125   ALT 16 07/15/2017 0949   BILITOT 0.5 04/18/2021 1125   BILITOT 0.61 07/15/2017 0949       RADIOGRAPHIC STUDIES: No results found.  ASSESSMENT AND PLAN:  This is a very pleasant 87 years old white male with history of multiple myeloma diagnosed in November 2015 status post several chemotherapy regimens as well as radiation. The patient has been in observation for several years now and doing fine with no concerning complaints. Repeat myeloma panel showed no concerning findings for progression. I recommended for him to proceed with the Zometa infusion today as planned. I will see him back for follow-up visit in 6 months for evaluation and repeat myeloma panel. He was advised to call immediately if he has any concerning  symptoms in the interval. The patient voices understanding of current disease status and treatment options and is in agreement with the current care plan.  All questions were answered. The patient knows to call the clinic with any problems, questions or concerns. We can certainly see the patient much sooner if necessary.  Disclaimer: This note was dictated with voice recognition software. Similar sounding words can inadvertently be transcribed and may not be corrected upon review.

## 2021-04-25 NOTE — Patient Instructions (Signed)

## 2021-05-15 ENCOUNTER — Encounter: Payer: Self-pay | Admitting: Physical Medicine & Rehabilitation

## 2021-05-15 ENCOUNTER — Other Ambulatory Visit: Payer: Self-pay

## 2021-05-15 ENCOUNTER — Encounter: Payer: Medicare Other | Attending: Physical Medicine & Rehabilitation | Admitting: Physical Medicine & Rehabilitation

## 2021-05-15 VITALS — BP 113/75 | HR 85 | Temp 97.4°F | Ht 69.0 in | Wt 150.2 lb

## 2021-05-15 DIAGNOSIS — G8220 Paraplegia, unspecified: Secondary | ICD-10-CM | POA: Insufficient documentation

## 2021-05-15 DIAGNOSIS — I69391 Dysphagia following cerebral infarction: Secondary | ICD-10-CM | POA: Diagnosis not present

## 2021-05-15 DIAGNOSIS — I6381 Other cerebral infarction due to occlusion or stenosis of small artery: Secondary | ICD-10-CM | POA: Diagnosis not present

## 2021-05-15 NOTE — Patient Instructions (Addendum)
SALONPAS WITH LIDOCAINE PATCHES OR ROLL ON  ICYHOT WITH LIDOCAINE PATCHES OR ROLL-ON   ?PROBIOTIC  ?IMMODIUM

## 2021-05-15 NOTE — Progress Notes (Signed)
Subjective:    Patient ID: Jeremiah Owens, male    DOB: October 19, 1929, 87 y.o.   MRN: [id number]  HPI  Jeremiah Owens is here regarding his left basal ganglia infarct. He states that he's been doing fairly well. He is not getting out of the house much. He's afraid of falling. His left shoulder remains limiting.  They are trying some Voltaren gel on the shoulder which gives some relief.  We had discussed some other topical remedies which they have not tried yet.  From the standpoint of his hearing he has gotten some new hearing aids.  He is having some problems with lower frequency input.  They are going back to the center where these were fitted in a week or 2.  Swallowing has been fairly stable.  He does feel that his speech is a bit more audible and he is able to carry more conversations and clear manner.  From a standpoint of balance, he continues use his rolling walker.  He has not had a fall for several months.  He tries to be safe and use strategies described and presented to him in therapy.   Pain Inventory Average Pain 8 pain in left shoulder with movement Pain Right Now 0 My pain is intermittent, stabbing, and aching  LOCATION OF PAIN  shoulder  BOWEL Number of stools per week: 21 Oral laxative use No  Type of laxative  na Enema or suppository use No  History of colostomy No  Incontinent No   BLADDER Pads In and out cath, frequency na Able to self cath  na Bladder incontinence No  Frequent urination Yes  Leakage with coughing No  Difficulty starting stream No  Incomplete bladder emptying No    Mobility use a walker do you drive?  no  Function retired I need assistance with the following:  meal prep, household duties, and shopping  Neuro/Psych bladder control problems bowel control problems trouble walking  Prior Studies Any changes since last visit?  no  Physicians involved in your care Primary care Jeremiah Owens, [facility] Oncologist   Family  History  Problem Relation Age of Onset   Cancer Mother        breast   Stroke Mother    Social History   Socioeconomic History   Marital status: Married    Spouse name: Not on file   Number of children: Not on file   Years of education: Not on file   Highest education level: Not on file  Occupational History   Not on file  Tobacco Use   Smoking status: Never   Smokeless tobacco: Never  Vaping Use   Vaping Use: Never used  Substance and Sexual Activity   Alcohol use: No    Alcohol/week: 0.0 standard drinks   Drug use: No   Sexual activity: Not on file  Other Topics Concern   Not on file  Social History Narrative   Not on file   Social Determinants of Health   Financial Resource Strain: Not on file  Food Insecurity: Not on file  Transportation Needs: Not on file  Physical Activity: Not on file  Stress: Not on file  Social Connections: Not on file   Past Surgical History:  Procedure Laterality Date   HERNIA REPAIR     POSTERIOR CERVICAL FUSION/FORAMINOTOMY N/A 06/17/2014   Procedure: Thoracic five laminectomy for epidural tumor resection Thoracic 4-7 posterior lateral arthrodesis, segmental pedicle screw fixation.;  Surgeon: Ashok Pall, MD;  Location: [place]  [facility];  Service: Neurosurgery;  Laterality: N/A;   Past Medical History:  Diagnosis Date   Allergy    Anxiety    Bone cancer ([place])    t_spine T-5   Compression fracture    Encounter for antineoplastic chemotherapy 01/31/2015   Hearing loss    Melanoma ([place])    Multiple myeloma ([place])    Prostate cancer ([place]) 2002   S/P radiation therapy 08/21/14-09/04/14   T4-6 25Gy/70f   S/P radiation therapy 11/29/14-12/12/14   rt prox humerus/shoulder/scapula 25Gy/183f  Skin cancer 1994   melanoma  right neck    Stroke ([facility])    Thyroid disease    BP 113/75   Pulse 85   Temp (!) 97.4 F (36.3 C) (Oral)   Ht _0  (1.753 m)   Wt 150 lb 3.2 oz (68.1 kg)   BMI 22.18 kg/m   Opioid Risk Score:   Fall Risk Score:   `1  Depression screen PHQ 2/9  Depression screen PHQ 2/9 05/02/2020  Decreased Interest 0  Down, Depressed, Hopeless 0  PHQ - 2 Score 0  Altered sleeping 0  Tired, decreased energy 1  Change in appetite 0  Feeling bad or failure about yourself  0  Trouble concentrating 1  Moving slowly or fidgety/restless 0  Suicidal thoughts 0  PHQ-9 Score 2  Difficult doing work/chores Not difficult at all  Some recent data might be hidden     Review of Systems  Musculoskeletal:        Shoulder pain  All other systems reviewed and are negative.     Objective:   Physical Exam  General: No acute distress HEENT: NCAT, EOMI, oral membranes moist Cards: reg rate  Chest: normal effort Abdomen: Soft, NT, ND Skin: dry, intact Extremities: no edema Psych: pleasant and appropriate  Neuro:  Alert and oriented x 3. Normal insight and awareness. Intact Memory. Normal language and speech. Cranial nerve exam unremarkable. Voice still a little wet, but better than before.    RLE 4/5 LLE 4+/5. RUE 5/5. LUE still limited by shoulder pain. head forward/leaning gait with shuffling pattern.    Musculoskeletal: about 60-70 degrees of passive abduction with pain.            Assessment & Plan:  1.  History of left basal ganglia infarct with right hemiparesis 2.  History of multiple myeloma with thoracic spinal mets 3.  History of BPH 4. Dysphagia d/t CVA 5.  Multifactorial gait disorder related to the above in addition to his significant kyphotic posture and adhesive capsulitis in the left shoulder.  Adversely affected by covid pandemic    Plan: 1.  ROM/exercises at home             -topical remedies were discussed and written down for patient             -don't see the benefit of another injection.  2.  safety with swallowing discussed. Doing fairly well 3.  RW for [place]ane and safety   15 minutes of direct patient time was spent in the office today.  I will see him back here in about 6  mos

## 2021-06-18 DIAGNOSIS — Z85828 Personal history of other malignant neoplasm of skin: Secondary | ICD-10-CM | POA: Diagnosis not present

## 2021-06-18 DIAGNOSIS — B078 Other viral warts: Secondary | ICD-10-CM | POA: Diagnosis not present

## 2021-06-18 DIAGNOSIS — L570 Actinic keratosis: Secondary | ICD-10-CM | POA: Diagnosis not present

## 2021-06-18 DIAGNOSIS — L821 Other seborrheic keratosis: Secondary | ICD-10-CM | POA: Diagnosis not present

## 2021-07-09 DIAGNOSIS — R269 Unspecified abnormalities of gait and mobility: Secondary | ICD-10-CM | POA: Diagnosis not present

## 2021-07-09 DIAGNOSIS — Z8546 Personal history of malignant neoplasm of prostate: Secondary | ICD-10-CM | POA: Diagnosis not present

## 2021-07-09 DIAGNOSIS — D696 Thrombocytopenia, unspecified: Secondary | ICD-10-CM | POA: Diagnosis not present

## 2021-07-09 DIAGNOSIS — R7303 Prediabetes: Secondary | ICD-10-CM | POA: Diagnosis not present

## 2021-07-09 DIAGNOSIS — C439 Malignant melanoma of skin, unspecified: Secondary | ICD-10-CM | POA: Diagnosis not present

## 2021-07-09 DIAGNOSIS — Z0000 Encounter for general adult medical examination without abnormal findings: Secondary | ICD-10-CM | POA: Diagnosis not present

## 2021-07-09 DIAGNOSIS — M6281 Muscle weakness (generalized): Secondary | ICD-10-CM | POA: Diagnosis not present

## 2021-07-09 DIAGNOSIS — E039 Hypothyroidism, unspecified: Secondary | ICD-10-CM | POA: Diagnosis not present

## 2021-07-09 DIAGNOSIS — Z1389 Encounter for screening for other disorder: Secondary | ICD-10-CM | POA: Diagnosis not present

## 2021-07-09 DIAGNOSIS — I6340 Cerebral infarction due to embolism of unspecified cerebral artery: Secondary | ICD-10-CM | POA: Diagnosis not present

## 2021-07-09 DIAGNOSIS — C9000 Multiple myeloma not having achieved remission: Secondary | ICD-10-CM | POA: Diagnosis not present

## 2021-07-09 DIAGNOSIS — K219 Gastro-esophageal reflux disease without esophagitis: Secondary | ICD-10-CM | POA: Diagnosis not present

## 2021-07-09 DIAGNOSIS — R011 Cardiac murmur, unspecified: Secondary | ICD-10-CM | POA: Diagnosis not present

## 2021-07-24 ENCOUNTER — Other Ambulatory Visit: Payer: Self-pay

## 2021-07-24 DIAGNOSIS — C9000 Multiple myeloma not having achieved remission: Secondary | ICD-10-CM

## 2021-07-25 ENCOUNTER — Other Ambulatory Visit: Payer: Self-pay

## 2021-07-25 ENCOUNTER — Inpatient Hospital Stay: Payer: Medicare Other | Attending: Internal Medicine

## 2021-07-25 ENCOUNTER — Inpatient Hospital Stay: Payer: Medicare Other

## 2021-07-25 ENCOUNTER — Ambulatory Visit: Payer: Medicare Other | Admitting: Internal Medicine

## 2021-07-25 VITALS — BP 126/60 | HR 83 | Temp 97.9°F | Resp 18

## 2021-07-25 DIAGNOSIS — G629 Polyneuropathy, unspecified: Secondary | ICD-10-CM | POA: Insufficient documentation

## 2021-07-25 DIAGNOSIS — Z8546 Personal history of malignant neoplasm of prostate: Secondary | ICD-10-CM | POA: Insufficient documentation

## 2021-07-25 DIAGNOSIS — Z923 Personal history of irradiation: Secondary | ICD-10-CM | POA: Diagnosis not present

## 2021-07-25 DIAGNOSIS — Z85820 Personal history of malignant melanoma of skin: Secondary | ICD-10-CM | POA: Insufficient documentation

## 2021-07-25 DIAGNOSIS — C9000 Multiple myeloma not having achieved remission: Secondary | ICD-10-CM

## 2021-07-25 DIAGNOSIS — Z85828 Personal history of other malignant neoplasm of skin: Secondary | ICD-10-CM | POA: Insufficient documentation

## 2021-07-25 DIAGNOSIS — Z8673 Personal history of transient ischemic attack (TIA), and cerebral infarction without residual deficits: Secondary | ICD-10-CM | POA: Diagnosis not present

## 2021-07-25 DIAGNOSIS — R5383 Other fatigue: Secondary | ICD-10-CM | POA: Diagnosis not present

## 2021-07-25 DIAGNOSIS — M2550 Pain in unspecified joint: Secondary | ICD-10-CM | POA: Diagnosis not present

## 2021-07-25 DIAGNOSIS — C7951 Secondary malignant neoplasm of bone: Secondary | ICD-10-CM | POA: Diagnosis not present

## 2021-07-25 LAB — CBC WITH DIFFERENTIAL (CANCER CENTER ONLY)
Abs Immature Granulocytes: 0.01 10*3/uL (ref 0.00–0.07)
Basophils Absolute: 0 10*3/uL (ref 0.0–0.1)
Basophils Relative: 0 %
Eosinophils Absolute: 0.2 10*3/uL (ref 0.0–0.5)
Eosinophils Relative: 4 %
HCT: 35.9 % — ABNORMAL LOW (ref 39.0–52.0)
Hemoglobin: 11.7 g/dL — ABNORMAL LOW (ref 13.0–17.0)
Immature Granulocytes: 0 %
Lymphocytes Relative: 16 %
Lymphs Abs: 0.8 10*3/uL (ref 0.7–4.0)
MCH: 29.6 pg (ref 26.0–34.0)
MCHC: 32.6 g/dL (ref 30.0–36.0)
MCV: 90.9 fL (ref 80.0–100.0)
Monocytes Absolute: 0.4 10*3/uL (ref 0.1–1.0)
Monocytes Relative: 8 %
Neutro Abs: 3.7 10*3/uL (ref 1.7–7.7)
Neutrophils Relative %: 72 %
Platelet Count: 155 10*3/uL (ref 150–400)
RBC: 3.95 MIL/uL — ABNORMAL LOW (ref 4.22–5.81)
RDW: 14.7 % (ref 11.5–15.5)
WBC Count: 5.2 10*3/uL (ref 4.0–10.5)
nRBC: 0 % (ref 0.0–0.2)

## 2021-07-25 LAB — CMP (CANCER CENTER ONLY)
ALT: 13 U/L (ref 0–44)
AST: 18 U/L (ref 15–41)
Albumin: 4 g/dL (ref 3.5–5.0)
Alkaline Phosphatase: 64 U/L (ref 38–126)
Anion gap: 7 (ref 5–15)
BUN: 24 mg/dL — ABNORMAL HIGH (ref 8–23)
CO2: 29 mmol/L (ref 22–32)
Calcium: 9.7 mg/dL (ref 8.9–10.3)
Chloride: 104 mmol/L (ref 98–111)
Creatinine: 1.07 mg/dL (ref 0.61–1.24)
GFR, Estimated: >60 mL/min (ref >60)
Glucose, Bld: 100 mg/dL — ABNORMAL HIGH (ref 70–99)
Potassium: 4 mmol/L (ref 3.5–5.1)
Sodium: 140 mmol/L (ref 135–145)
Total Bilirubin: 0.5 mg/dL (ref 0.3–1.2)
Total Protein: 6.7 g/dL (ref 6.5–8.1)

## 2021-07-25 MED ORDER — SODIUM CHLORIDE 0.9 % IV SOLN: INTRAVENOUS | Status: DC

## 2021-07-25 MED ORDER — ZOLEDRONIC ACID 4 MG/100ML IV SOLN
4.0000 mg | Freq: Once | INTRAVENOUS | Status: AC
  Administered 2021-07-25: 10:00:00 4 mg via INTRAVENOUS
  Filled 2021-07-25: qty 100

## 2021-07-25 NOTE — Patient Instructions (Signed)

## 2021-10-16 ENCOUNTER — Inpatient Hospital Stay: Payer: Medicare Other | Attending: Internal Medicine

## 2021-10-16 ENCOUNTER — Other Ambulatory Visit: Payer: Self-pay

## 2021-10-16 DIAGNOSIS — Z85828 Personal history of other malignant neoplasm of skin: Secondary | ICD-10-CM | POA: Insufficient documentation

## 2021-10-16 DIAGNOSIS — R5383 Other fatigue: Secondary | ICD-10-CM | POA: Insufficient documentation

## 2021-10-16 DIAGNOSIS — C7951 Secondary malignant neoplasm of bone: Secondary | ICD-10-CM | POA: Insufficient documentation

## 2021-10-16 DIAGNOSIS — Z8546 Personal history of malignant neoplasm of prostate: Secondary | ICD-10-CM | POA: Diagnosis not present

## 2021-10-16 DIAGNOSIS — Z85820 Personal history of malignant melanoma of skin: Secondary | ICD-10-CM | POA: Diagnosis not present

## 2021-10-16 DIAGNOSIS — Z8673 Personal history of transient ischemic attack (TIA), and cerebral infarction without residual deficits: Secondary | ICD-10-CM | POA: Diagnosis not present

## 2021-10-16 DIAGNOSIS — Z923 Personal history of irradiation: Secondary | ICD-10-CM | POA: Diagnosis not present

## 2021-10-16 DIAGNOSIS — C9000 Multiple myeloma not having achieved remission: Secondary | ICD-10-CM | POA: Insufficient documentation

## 2021-10-16 LAB — CMP (CANCER CENTER ONLY)
ALT: 16 U/L (ref 0–44)
AST: 20 U/L (ref 15–41)
Albumin: 4.2 g/dL (ref 3.5–5.0)
Alkaline Phosphatase: 76 U/L (ref 38–126)
Anion gap: 6 (ref 5–15)
BUN: 27 mg/dL — ABNORMAL HIGH (ref 8–23)
CO2: 31 mmol/L (ref 22–32)
Calcium: 9.9 mg/dL (ref 8.9–10.3)
Chloride: 102 mmol/L (ref 98–111)
Creatinine: 1.17 mg/dL (ref 0.61–1.24)
GFR, Estimated: 60 mL/min — ABNORMAL LOW (ref >60)
Glucose, Bld: 95 mg/dL (ref 70–99)
Potassium: 4 mmol/L (ref 3.5–5.1)
Sodium: 139 mmol/L (ref 135–145)
Total Bilirubin: 0.5 mg/dL (ref 0.3–1.2)
Total Protein: 7.3 g/dL (ref 6.5–8.1)

## 2021-10-16 LAB — CBC WITH DIFFERENTIAL (CANCER CENTER ONLY)
Abs Immature Granulocytes: 0.01 10*3/uL (ref 0.00–0.07)
Basophils Absolute: 0 10*3/uL (ref 0.0–0.1)
Basophils Relative: 0 %
Eosinophils Absolute: 0.2 10*3/uL (ref 0.0–0.5)
Eosinophils Relative: 3 %
HCT: 36.8 % — ABNORMAL LOW (ref 39.0–52.0)
Hemoglobin: 12.3 g/dL — ABNORMAL LOW (ref 13.0–17.0)
Immature Granulocytes: 0 %
Lymphocytes Relative: 15 %
Lymphs Abs: 0.8 10*3/uL (ref 0.7–4.0)
MCH: 29.9 pg (ref 26.0–34.0)
MCHC: 33.4 g/dL (ref 30.0–36.0)
MCV: 89.3 fL (ref 80.0–100.0)
Monocytes Absolute: 0.4 10*3/uL (ref 0.1–1.0)
Monocytes Relative: 8 %
Neutro Abs: 4.1 10*3/uL (ref 1.7–7.7)
Neutrophils Relative %: 74 %
Platelet Count: 179 10*3/uL (ref 150–400)
RBC: 4.12 MIL/uL — ABNORMAL LOW (ref 4.22–5.81)
RDW: 15.2 % (ref 11.5–15.5)
WBC Count: 5.6 10*3/uL (ref 4.0–10.5)
nRBC: 0 % (ref 0.0–0.2)

## 2021-10-16 LAB — LACTATE DEHYDROGENASE: LDH: 155 U/L (ref 98–192)

## 2021-10-17 LAB — BETA 2 MICROGLOBULIN, SERUM: Beta-2 Microglobulin: 3 mg/L — ABNORMAL HIGH (ref 0.6–2.4)

## 2021-10-17 LAB — KAPPA/LAMBDA LIGHT CHAINS
Kappa free light chain: 28 mg/L — ABNORMAL HIGH (ref 3.3–19.4)
Kappa, lambda light chain ratio: 2.01 — ABNORMAL HIGH (ref 0.26–1.65)
Lambda free light chains: 13.9 mg/L (ref 5.7–26.3)

## 2021-10-17 LAB — IGG, IGA, IGM
IgA: 162 mg/dL (ref 61–437)
IgG (Immunoglobin G), Serum: 1019 mg/dL (ref 603–1613)
IgM (Immunoglobulin M), Srm: 73 mg/dL (ref 15–143)

## 2021-10-19 ENCOUNTER — Emergency Department (HOSPITAL_COMMUNITY): Payer: Medicare Other

## 2021-10-19 ENCOUNTER — Emergency Department (HOSPITAL_COMMUNITY)
Admission: EM | Admit: 2021-10-19 | Discharge: 2021-10-20 | Disposition: A | Discharge status: Home / Self Care | Payer: Medicare Other | Attending: Emergency Medicine | Admitting: Emergency Medicine

## 2021-10-19 DIAGNOSIS — Z7982 Long term (current) use of aspirin: Secondary | ICD-10-CM | POA: Insufficient documentation

## 2021-10-19 DIAGNOSIS — S199XXA Unspecified injury of neck, initial encounter: Secondary | ICD-10-CM | POA: Diagnosis not present

## 2021-10-19 DIAGNOSIS — W19XXXA Unspecified fall, initial encounter: Secondary | ICD-10-CM

## 2021-10-19 DIAGNOSIS — W182XXA Fall in (into) shower or empty bathtub, initial encounter: Secondary | ICD-10-CM | POA: Diagnosis not present

## 2021-10-19 DIAGNOSIS — S0101XA Laceration without foreign body of scalp, initial encounter: Secondary | ICD-10-CM | POA: Insufficient documentation

## 2021-10-19 DIAGNOSIS — Y92002 Bathroom of unspecified non-institutional (private) residence single-family (private) house as the place of occurrence of the external cause: Secondary | ICD-10-CM | POA: Diagnosis not present

## 2021-10-19 DIAGNOSIS — S0990XA Unspecified injury of head, initial encounter: Secondary | ICD-10-CM | POA: Diagnosis not present

## 2021-10-19 DIAGNOSIS — R58 Hemorrhage, not elsewhere classified: Secondary | ICD-10-CM | POA: Diagnosis not present

## 2021-10-19 DIAGNOSIS — R9431 Abnormal electrocardiogram [ECG] [EKG]: Secondary | ICD-10-CM | POA: Diagnosis not present

## 2021-10-19 MED ORDER — LIDOCAINE HCL (PF) 1 % IJ SOLN
5.0000 mL | Freq: Once | INTRAMUSCULAR | Status: AC
  Administered 2021-10-20: 5 mL via INTRADERMAL
  Filled 2021-10-19: qty 5

## 2021-10-19 NOTE — ED Notes (Signed)
Lidocaine and suture cart at door. ?

## 2021-10-19 NOTE — ED Notes (Signed)
Trauma Response Nurse Documentation ? ? ?Jeremiah Owens is a 88 y.o. male arriving to [facility] ED via EMS ? ?On aspirin 81 mg daily. Trauma was activated as a Level 2 by ED charge RN based on the following trauma criteria Elderly patients > 65 with head trauma on anti-coagulation (excluding ASA). Downgraded after arrival by myself due to aspirin, no other blood thinners. TRN at the bedside on patient arrival.  Patient to CT with team. GCS 15. ? ?History  ? Past Medical History:  ?Diagnosis Date  ? Allergy   ? Anxiety   ? Bone cancer ([place])   ? t_spine T-5  ? Compression fracture   ? Encounter for antineoplastic chemotherapy 01/31/2015  ? Hearing loss   ? Melanoma ([place])   ? Multiple myeloma ([place])   ? Prostate cancer [facility]) 2002  ? S/P radiation therapy 08/21/14-09/04/14  ? T4-6 25Gy/102f  ? S/P radiation therapy 11/29/14-12/12/14  ? rt prox humerus/shoulder/scapula 25Gy/142f ? Skin cancer 1994  ? melanoma  right neck   ? [facility]  ? Thyroid disease   ?  ? Past Surgical History:  ?Procedure Laterality Date  ? HERNIA REPAIR    ? POSTERIOR CERVICAL FUSION/FORAMINOTOMY N/A 06/17/2014  ? Procedure: Thoracic five laminectomy for epidural tumor resection Thoracic 4-7 posterior lateral arthrodesis, segmental pedicle screw fixation.;  Surgeon: KyAshok PallMD;  [facility];  Service: Neurosurgery;  Laterality: N/A;  ?  ? ? ? ?Initial Focused Assessment (If applicable, or please see trauma documentation): ?Laceration posterior head ? ?CT's Completed:   ?CT Head and CT C-Spine  ? ?Interventions:  ?CTs as above ?Wound care, lac repair ? ?Plan for disposition:  ?Dispo home, laceration repair ? ?Consults completed:  ?none at the time this note. ? ?Event Summary: ?Patient arrives via [facility] after a fall with posterior head lac. EMS reported that he was on two blood thinners. Upon arrival, chart review and patient interview reveals just aspirin 81MG. Level two downgraded. CT head and cervical spine completed.  ? ?MTP  Summary (If applicable): NA ? ?Bedside handoff with ED RN CoKae Heller  ? ?Em[place]andywine?Trauma Response RN ? ?Please call TRN at [phone] further assistance. ?  ?

## 2021-10-19 NOTE — ED Provider Notes (Signed)
?[place] ?Provider Note ? ? ?CSN: [id number] ?Arrival date & time: 10/19/21  2227 ? ?  ? ?History ? ?Chief Complaint  ?Patient presents with  ? Fall  ? ? ?Jeremiah Owens is a 88 y.o. male. ? ?The history is provided by the patient and medical records.  ? ?88 y.o. M with hx of prior CVA, aphasia, hx of MM, presenting to the ED following a fall.  States he was in the shower and slipped, struck head on shower wall.  Denies LOC.  States uses walker, however leaves it outside the shower.  Has laceration on back of head.  Denies any other areas of pain.  Unsure of last tetanus.  He is on ASA only. ? ?Home Medications ?Prior to Admission medications   ?Medication Sig Start Date End Date Taking? Authorizing Provider  ?aspirin 81 MG EC tablet Take 81 mg by mouth daily. 03/29/15   [provider]  ?Calcium Carbonate-Vitamin D (CALTRATE 600+D PO) Take 600 mg by mouth 2 (two) times daily.    [provider]  ?cholecalciferol (VITAMIN D) 1000 UNITS tablet Take 1,000 Units by mouth at bedtime.     [provider]  ?diphenoxylate-atropine (LOMOTIL) 2.5-0.025 MG tablet Take 2 tablets by mouth 4 (four) times daily as needed for diarrhea or loose stools. 04/01/16   Curt Bears, MD  ?furosemide (LASIX) 20 MG tablet Take 20 mg by mouth daily. Pt takes 1 tablet daily except on Mon and Thurs takes 2 tablets    [provider]  ?hydrocortisone 2.5 % lotion Apply 1 application topically at bedtime as needed. 03/24/20   [provider]  ?KLOR-CON 10 10 MEQ tablet Take 10 mEq by mouth daily. 03/21/15   [provider]  ?levothyroxine (SYNTHROID) 125 MCG tablet 1 tablet in the morning on an empty stomach 08/01/20   [provider]  ?methocarbamol (ROBAXIN) 500 MG tablet TAKE 1 TABLET EVERY 6 HOURS AS NEEDED FOR MUSCLE SPASMS 03/31/18   Meredith Staggers, MD  ?Multiple Vitamins-Minerals (CENTRUM SILVER PO) Take 1 tablet by mouth daily.     [provider]  ?omeprazole (PRILOSEC) 40 MG capsule Take 40 mg by mouth daily as needed (heartburn).     [provider]  ?oxybutynin (DITROPAN) 5 MG tablet TK 1 T PO ONCE D 03/15/19   [provider]  ?saccharomyces boulardii (FLORASTOR) 250 MG capsule Take 250 mg by mouth 2 (two) times daily.    [provider]  ?tamsulosin (FLOMAX) 0.4 MG CAPS capsule Take 1 capsule (0.4 mg total) by mouth at bedtime. 02/21/15   Cathlyn Parsons, PA-C  ?TEXACORT 2.5 % SOLN APP TO THE SCALP HS PRN 07/02/17   [provider]  ?   ? ?Allergies    ?Iodine   ? ?Review of Systems   ?Review of Systems  ?Constitutional:   ?     Fall in shower  ?All other systems reviewed and are negative. ? ?Physical Exam ?Updated Vital Signs ?BP (!) 148/68   Pulse 85   Temp 98.7 ?F (37.1 ?C) (Oral)   Resp 18   Ht '5\' 9"'$  (1.753 m)   Wt 68 kg   SpO2 100%   BMI 22.15 kg/m?  ? ?Physical Exam ?Vitals and nursing note reviewed.  ?Constitutional:   ?   Appearance: He is well-developed.  ?HENT:  ?   Head: Normocephalic and atraumatic.  ?   Comments: 2cm laceration noted to occipital scalp, adjacent abrasion  without bleeding ?   Ears:  ?   Comments: Hearing aids in place ?Eyes:  ?   Conjunctiva/sclera: Conjunctivae normal.  ?   Pupils: Pupils are equal, round, and reactive to light.  ?Cardiovascular:  ?   Rate and Rhythm: Normal rate and regular rhythm.  ?   Heart sounds: Normal heart sounds.  ?Pulmonary:  ?   Effort: Pulmonary effort is normal.  ?   Breath sounds: Normal breath sounds.  ?Abdominal:  ?   General: Bowel sounds are normal.  ?   Palpations: Abdomen is soft.  ?Musculoskeletal:     ?   General: Normal range of motion.  ?   Cervical back: Normal range of motion.  ?   Comments: Extremities atraumatic, pelvis stable and non-tender, no leg shortening  ?Skin: ?   General: Skin is warm and dry.  ?Neurological:  ?   Mental Status: He is alert and oriented to person, place, and time.  ?   Comments: AAOx3,  answering questions and following commands appropriately, moving extremities well  ? ? ?ED Results / Procedures / Treatments   ?Labs ?(all labs ordered are listed, but only abnormal results are displayed) ?Labs Reviewed - No data to display ? ?EKG ?EKG Interpretation ? ?Date/Time:  Saturday October 19 2021 22:35:24 EDT ?Ventricular Rate:  83 ?PR Interval:  197 ?QRS Duration: 90 ?QT Interval:  385 ?QTC Calculation: 453 ?R Axis:   97 ?Text Interpretation: Sinus rhythm Right axis deviation Minimal ST elevation, lateral leads No acute changes No significant change since last tracing Confirmed by Varney Biles [phone]) on 10/19/2021 11:28:50 PM ? ?Radiology ?CT HEAD WO CONTRAST (5MM) ? ?Result Date: 10/20/2021 ?CLINICAL DATA:  Head and neck trauma. EXAM: CT HEAD WITHOUT CONTRAST CT CERVICAL SPINE WITHOUT CONTRAST TECHNIQUE: Multidetector CT imaging of the head and cervical spine was performed following the standard protocol without intravenous contrast. Multiplanar CT image reconstructions of the cervical spine were also generated. RADIATION DOSE REDUCTION: This exam was performed according to the departmental dose-optimization program which includes automated exposure control, adjustment of the mA and/or kV according to patient size and/or use of iterative reconstruction technique. COMPARISON:  03/29/2020, 10/17/2014. FINDINGS: CT HEAD FINDINGS Brain: No acute intracranial hemorrhage, midline shift or mass effect. No extra-axial fluid collection. Diffuse atrophy is noted. Subcortical and periventricular white matter hypodensities are present bilaterally. No hydrocephalus. An old infarct is present in the basal ganglia on the left. Vascular: Atherosclerotic calcification of the carotid siphons. No hyperdense vessel. Skull: Normal. Negative for fracture or focal lesion. Sinuses/Orbits: Mucosal thickening is present in the maxillary sinuses. Other: None. CT CERVICAL SPINE FINDINGS Alignment: There is mild anterolisthesis at  C5-C6, C6-C7, and C7-T1. Skull base and vertebrae: No acute fracture in the cervical spine. There is a compression fracture with vertebral plana at T5. Lucencies are noted in the C7 and T2 vertebral bodies. There is partial visualization of cervical spinal fusion hardware at T4 and T6. A compression deformity is noted in the inferior endplate at T1. Soft tissues and spinal canal: There is suboptimal visualization of the spinal canal due to lack of true axial images. No prevertebral soft tissue swelling is identified. Disc levels: Multilevel intervertebral disc space narrowing, uncovertebral osteophyte formation, and facet arthropathy is noted. Upper chest: Pleural thickening with calcifications is noted bilaterally. There is mild atelectasis or infiltrate on the right. Atherosclerotic calcification of the aorta is noted. Other: None. IMPRESSION: 1. No acute intracranial hemorrhage. 2. Atrophy with chronic microvascular ischemic changes and  old infarct in the basal ganglia on the left. 3. No acute fracture in the cervical spine. 4. Stable compression deformity in the inferior endplate at T1 and vertebral plana at T5. 5. Stable lucencies in the C7 and T2 vertebral bodies, unchanged from 2016. Electronically Signed   By: Brett Fairy M.D.   On: 10/20/2021 00:16  ? ?CT Cervical Spine Wo Contrast ? ?Result Date: 10/20/2021 ?CLINICAL DATA:  Head and neck trauma. EXAM: CT HEAD WITHOUT CONTRAST CT CERVICAL SPINE WITHOUT CONTRAST TECHNIQUE: Multidetector CT imaging of the head and cervical spine was performed following the standard protocol without intravenous contrast. Multiplanar CT image reconstructions of the cervical spine were also generated. RADIATION DOSE REDUCTION: This exam was performed according to the departmental dose-optimization program which includes automated exposure control, adjustment of the mA and/or kV according to patient size and/or use of iterative reconstruction technique. COMPARISON:  03/29/2020,  10/17/2014. FINDINGS: CT HEAD FINDINGS Brain: No acute intracranial hemorrhage, midline shift or mass effect. No extra-axial fluid collection. Diffuse atrophy is noted. Subcortical and periventricular white matt

## 2021-10-19 NOTE — Progress Notes (Signed)
Orthopedic Tech Progress Note ?Patient Details:  ?Jeremiah Owens ?[birth year] ?[id number] ? ?Patient ID: Jeremiah Owens, male   DOB: [birth year], 88 y.o.   MRN: [id number] ?I attended trauma page. ?Karolee Stamps ?10/19/2021, 11:29 PM ? ?

## 2021-10-20 DIAGNOSIS — S0101XA Laceration without foreign body of scalp, initial encounter: Secondary | ICD-10-CM | POA: Diagnosis not present

## 2021-10-20 NOTE — ED Notes (Signed)
Pt verbalized understanding of d/c instructions, meds, and followup care. Denies questions. VSS, no distress noted. Steady gait to exit with all belongings.  ?

## 2021-10-20 NOTE — Discharge Instructions (Signed)
Staples will need to be removed in about 1 week, primary care doctor can do this for you in office. ?Return here for any new/acute changes. ?

## 2021-10-23 ENCOUNTER — Other Ambulatory Visit: Payer: Self-pay

## 2021-10-23 ENCOUNTER — Inpatient Hospital Stay: Payer: Medicare Other

## 2021-10-23 ENCOUNTER — Inpatient Hospital Stay (HOSPITAL_BASED_OUTPATIENT_CLINIC_OR_DEPARTMENT_OTHER): Payer: Medicare Other | Admitting: Internal Medicine

## 2021-10-23 VITALS — Temp 97.9°F

## 2021-10-23 VITALS — BP 126/70 | HR 93 | Resp 17 | Wt 151.0 lb

## 2021-10-23 DIAGNOSIS — Z8546 Personal history of malignant neoplasm of prostate: Secondary | ICD-10-CM | POA: Diagnosis not present

## 2021-10-23 DIAGNOSIS — Z85820 Personal history of malignant melanoma of skin: Secondary | ICD-10-CM | POA: Diagnosis not present

## 2021-10-23 DIAGNOSIS — C7951 Secondary malignant neoplasm of bone: Secondary | ICD-10-CM | POA: Diagnosis not present

## 2021-10-23 DIAGNOSIS — C9000 Multiple myeloma not having achieved remission: Secondary | ICD-10-CM

## 2021-10-23 DIAGNOSIS — R5383 Other fatigue: Secondary | ICD-10-CM | POA: Diagnosis not present

## 2021-10-23 DIAGNOSIS — Z923 Personal history of irradiation: Secondary | ICD-10-CM | POA: Diagnosis not present

## 2021-10-23 MED ORDER — ZOLEDRONIC ACID 4 MG/100ML IV SOLN
4.0000 mg | Freq: Once | INTRAVENOUS | Status: AC
  Administered 2021-10-23: 4 mg via INTRAVENOUS
  Filled 2021-10-23: qty 100

## 2021-10-23 MED ORDER — SODIUM CHLORIDE 0.9 % IV SOLN: INTRAVENOUS | Status: DC

## 2021-10-23 NOTE — Patient Instructions (Signed)

## 2021-10-23 NOTE — Progress Notes (Signed)
?    [place] ?[phone]   [phone] ? ?OFFICE PROGRESS NOTE ? ?Wenda Low, MD ?[address] ?[place] [address] ? ?DIAGNOSIS: Multiple myeloma diagnosed in November 2015. ? ?PRIOR THERAPY: ?1) Status post Thoracic five laminectomy for epidural tumor resection, Thoracic 4-7 posterior lateral arthrodesis,   ?segmental pedicle screw fixation T4-T7 Globus instrumentation under the care of Dr. Christella Noa on 06/18/2014. ?2) Systemic chemotherapy with Velcade 1.3 MG/M2 subcutaneously weekly in addition to Decadron 40 mg by mouth on weekly basis. Status post 11 cycles. ?3) palliative radiotherapy to the right proximal humerus, shoulder and scapula under the care of Dr. Lisbeth Renshaw completed on 12/12/2014. ?4)  Systemic chemotherapy with Velcade 1.3 MG/M2 subcutaneously weekly, Decadron 40 mg by mouth weekly in addition to Revlimid 25 MG by mouth daily for 21 days every 4 weeks, status post 14 cycles.  ? ?CURRENT THERAPY: ?Zometa 4 mg IV every 12 weeks. First dose was given 11/01/2014. ? ?INTERVAL HISTORY: ?Jeremiah Owens 88 y.o. male returns to the clinic today for follow-up visit accompanied by his wife.  The patient is feeling fine today with no concerning complaints except for the baseline fatigue and weakness.  He has a fall in the bathroom few days ago and he has few stitches in the back of the head.  He denied having any current chest pain, shortness of breath, cough or hemoptysis.  He denied having any fever or chills.  He has no nausea, vomiting, diarrhea or constipation.  He had repeat myeloma panel performed recently and he is here for evaluation and discussion of his lab results. ? ? ?MEDICAL HISTORY: ?Past Medical History:  ?Diagnosis Date  ? Allergy   ? Anxiety   ? Bone cancer ([place])   ? t_spine T-5  ? Compression fracture   ? Encounter for antineoplastic chemotherapy 01/31/2015  ? Hearing loss   ? Melanoma ([place])   ? Multiple myeloma ([place])   ? Prostate cancer  [facility]) 2002  ? S/P radiation therapy 08/21/14-09/04/14  ? T4-6 25Gy/72f  ? S/P radiation therapy 11/29/14-12/12/14  ? rt prox humerus/shoulder/scapula 25Gy/179f ? Skin cancer 1994  ? melanoma  right neck   ? [facility]  ? Thyroid disease   ? ? ?ALLERGIES:  is allergic to iodine. ? ?MEDICATIONS:  ?Current Outpatient Medications  ?Medication Sig Dispense Refill  ? aspirin 81 MG EC tablet Take 81 mg by mouth daily.  0  ? Calcium Carbonate-Vitamin D (CALTRATE 600+D PO) Take 600 mg by mouth 2 (two) times daily.    ? cholecalciferol (VITAMIN D) 1000 UNITS tablet Take 1,000 Units by mouth at bedtime.     ? diphenoxylate-atropine (LOMOTIL) 2.5-0.025 MG tablet Take 2 tablets by mouth 4 (four) times daily as needed for diarrhea or loose stools. 30 tablet 0  ? furosemide (LASIX) 20 MG tablet Take 20 mg by mouth daily. Pt takes 1 tablet daily except on Mon and Thurs takes 2 tablets    ? hydrocortisone 2.5 % lotion Apply 1 application topically at bedtime as needed.    ? KLOR-CON 10 10 MEQ tablet Take 10 mEq by mouth daily.    ? levothyroxine (SYNTHROID) 125 MCG tablet 1 tablet in the morning on an empty stomach    ? methocarbamol (ROBAXIN) 500 MG tablet TAKE 1 TABLET EVERY 6 HOURS AS NEEDED FOR MUSCLE SPASMS 180 tablet 4  ? Multiple Vitamins-Minerals (CENTRUM SILVER PO) Take 1 tablet by mouth daily.    ?  omeprazole (PRILOSEC) 40 MG capsule Take 40 mg by mouth daily as needed (heartburn).     ? oxybutynin (DITROPAN) 5 MG tablet TK 1 T PO ONCE D    ? saccharomyces boulardii (FLORASTOR) 250 MG capsule Take 250 mg by mouth 2 (two) times daily.    ? tamsulosin (FLOMAX) 0.4 MG CAPS capsule Take 1 capsule (0.4 mg total) by mouth at bedtime. 30 capsule 3  ? TEXACORT 2.5 % SOLN APP TO THE SCALP HS PRN  2  ? ?No current facility-administered medications for this visit.  ? ?Facility-Administered Medications Ordered in Other Visits  ?Medication Dose Route Frequency Provider Last Rate Last Admin  ? sodium chloride 0.9 % injection 10 mL  10  mL Intracatheter PRN Curt Bears, MD      ? ? ?SURGICAL HISTORY:  ?Past Surgical History:  ?Procedure Laterality Date  ? HERNIA REPAIR    ? POSTERIOR CERVICAL FUSION/FORAMINOTOMY N/A 06/17/2014  ? Procedure: Thoracic five laminectomy for epidural tumor resection Thoracic 4-7 posterior lateral arthrodesis, segmental pedicle screw fixation.;  Surgeon: Ashok Pall, MD;  Location: [facility];  Service: Neurosurgery;  Laterality: N/A;  ? ? ?REVIEW OF SYSTEMS:  A comprehensive review of systems was negative except for: Constitutional: positive for fatigue ?Musculoskeletal: positive for arthralgias and muscle weakness  ? ?PHYSICAL EXAMINATION: General appearance: alert, cooperative, fatigued, and no distress ?Head: Normocephalic, without obvious abnormality, atraumatic ?Neck: no adenopathy, no JVD, supple, symmetrical, trachea midline, and thyroid not enlarged, symmetric, no tenderness/mass/nodules ?Lymph nodes: Cervical, supraclavicular, and axillary nodes normal. ?Resp: clear to auscultation bilaterally ?Back: symmetric, no curvature. ROM normal. No CVA tenderness. ?Cardio: regular rate and rhythm, S1, S2 normal, no murmur, click, rub or gallop ?GI: soft, non-tender; bowel sounds normal; no masses,  no organomegaly ?Extremities: extremities normal, atraumatic, no cyanosis or edema ? ?ECOG PERFORMANCE STATUS: 1 - Symptomatic but completely ambulatory ? ?Blood pressure 126/70, pulse 93, resp. rate 17, weight 151 lb (68.5 kg), SpO2 97 %. ? ?LABORATORY DATA: ?Lab Results  ?Component Value Date  ? WBC 5.6 10/16/2021  ? HGB 12.3 (L) 10/16/2021  ? HCT 36.8 (L) 10/16/2021  ? MCV 89.3 10/16/2021  ? PLT 179 10/16/2021  ? ? ?  Chemistry   ?   ?Component Value Date/Time  ? NA 139 10/16/2021 1020  ? NA 141 07/15/2017 0949  ? K 4.0 10/16/2021 1020  ? K 4.4 07/15/2017 0949  ? CL 102 10/16/2021 1020  ? CO2 31 10/16/2021 1020  ? CO2 27 07/15/2017 0949  ? BUN 27 (H) 10/16/2021 1020  ? BUN 18.0 07/15/2017 0949  ? CREATININE 1.17  10/16/2021 1020  ? CREATININE 1.1 07/15/2017 0949  ?    ?Component Value Date/Time  ? CALCIUM 9.9 10/16/2021 1020  ? CALCIUM 9.2 07/15/2017 0949  ? ALKPHOS 76 10/16/2021 1020  ? ALKPHOS 56 07/15/2017 0949  ? AST 20 10/16/2021 1020  ? AST 20 07/15/2017 0949  ? ALT 16 10/16/2021 1020  ? ALT 16 07/15/2017 0949  ? BILITOT 0.5 10/16/2021 1020  ? BILITOT 0.61 07/15/2017 0949  ?  ? ? ? ?RADIOGRAPHIC STUDIES: ?CT HEAD WO CONTRAST (5MM) ? ?Result Date: 10/20/2021 ?CLINICAL DATA:  Head and neck trauma. EXAM: CT HEAD WITHOUT CONTRAST CT CERVICAL SPINE WITHOUT CONTRAST TECHNIQUE: Multidetector CT imaging of the head and cervical spine was performed following the standard protocol without intravenous contrast. Multiplanar CT image reconstructions of the cervical spine were also generated. RADIATION DOSE REDUCTION: This exam was performed according to the departmental  dose-optimization program which includes automated exposure control, adjustment of the mA and/or kV according to patient size and/or use of iterative reconstruction technique. COMPARISON:  03/29/2020, 10/17/2014. FINDINGS: CT HEAD FINDINGS Brain: No acute intracranial hemorrhage, midline shift or mass effect. No extra-axial fluid collection. Diffuse atrophy is noted. Subcortical and periventricular white matter hypodensities are present bilaterally. No hydrocephalus. An old infarct is present in the basal ganglia on the left. Vascular: Atherosclerotic calcification of the carotid siphons. No hyperdense vessel. Skull: Normal. Negative for fracture or focal lesion. Sinuses/Orbits: Mucosal thickening is present in the maxillary sinuses. Other: None. CT CERVICAL SPINE FINDINGS Alignment: There is mild anterolisthesis at C5-C6, C6-C7, and C7-T1. Skull base and vertebrae: No acute fracture in the cervical spine. There is a compression fracture with vertebral plana at T5. Lucencies are noted in the C7 and T2 vertebral bodies. There is partial visualization of cervical  spinal fusion hardware at T4 and T6. A compression deformity is noted in the inferior endplate at T1. Soft tissues and spinal canal: There is suboptimal visualization of the spinal canal due to lack of true

## 2021-10-28 DIAGNOSIS — R300 Dysuria: Secondary | ICD-10-CM | POA: Diagnosis not present

## 2021-10-28 DIAGNOSIS — N481 Balanitis: Secondary | ICD-10-CM | POA: Diagnosis not present

## 2021-10-29 DIAGNOSIS — R300 Dysuria: Secondary | ICD-10-CM | POA: Diagnosis not present

## 2021-11-03 ENCOUNTER — Encounter: Payer: Self-pay | Admitting: Internal Medicine

## 2021-11-23 ENCOUNTER — Other Ambulatory Visit: Payer: Self-pay | Admitting: Nurse Practitioner

## 2021-11-28 DIAGNOSIS — H02834 Dermatochalasis of left upper eyelid: Secondary | ICD-10-CM | POA: Diagnosis not present

## 2021-11-28 DIAGNOSIS — H02831 Dermatochalasis of right upper eyelid: Secondary | ICD-10-CM | POA: Diagnosis not present

## 2021-11-28 DIAGNOSIS — H16213 Exposure keratoconjunctivitis, bilateral: Secondary | ICD-10-CM | POA: Diagnosis not present

## 2021-11-28 DIAGNOSIS — H0102A Squamous blepharitis right eye, upper and lower eyelids: Secondary | ICD-10-CM | POA: Diagnosis not present

## 2021-11-28 DIAGNOSIS — H1045 Other chronic allergic conjunctivitis: Secondary | ICD-10-CM | POA: Diagnosis not present

## 2021-11-28 DIAGNOSIS — H0102B Squamous blepharitis left eye, upper and lower eyelids: Secondary | ICD-10-CM | POA: Diagnosis not present

## 2021-11-28 DIAGNOSIS — H16143 Punctate keratitis, bilateral: Secondary | ICD-10-CM | POA: Diagnosis not present

## 2021-11-28 DIAGNOSIS — Z961 Presence of intraocular lens: Secondary | ICD-10-CM | POA: Diagnosis not present

## 2021-11-28 DIAGNOSIS — H04123 Dry eye syndrome of bilateral lacrimal glands: Secondary | ICD-10-CM | POA: Diagnosis not present

## 2022-01-01 ENCOUNTER — Telehealth: Payer: Self-pay | Admitting: Internal Medicine

## 2022-01-01 NOTE — Telephone Encounter (Signed)
Called patient regarding upcoming June appointments, patient is notified.  

## 2022-01-03 DIAGNOSIS — D485 Neoplasm of uncertain behavior of skin: Secondary | ICD-10-CM | POA: Diagnosis not present

## 2022-01-03 DIAGNOSIS — L720 Epidermal cyst: Secondary | ICD-10-CM | POA: Diagnosis not present

## 2022-01-03 DIAGNOSIS — C44729 Squamous cell carcinoma of skin of left lower limb, including hip: Secondary | ICD-10-CM | POA: Diagnosis not present

## 2022-01-03 DIAGNOSIS — L814 Other melanin hyperpigmentation: Secondary | ICD-10-CM | POA: Diagnosis not present

## 2022-01-03 DIAGNOSIS — D0462 Carcinoma in situ of skin of left upper limb, including shoulder: Secondary | ICD-10-CM | POA: Diagnosis not present

## 2022-01-03 DIAGNOSIS — L570 Actinic keratosis: Secondary | ICD-10-CM | POA: Diagnosis not present

## 2022-01-03 DIAGNOSIS — Z85828 Personal history of other malignant neoplasm of skin: Secondary | ICD-10-CM | POA: Diagnosis not present

## 2022-01-03 DIAGNOSIS — D0471 Carcinoma in situ of skin of right lower limb, including hip: Secondary | ICD-10-CM | POA: Diagnosis not present

## 2022-01-03 DIAGNOSIS — L821 Other seborrheic keratosis: Secondary | ICD-10-CM | POA: Diagnosis not present

## 2022-01-14 DIAGNOSIS — C9000 Multiple myeloma not having achieved remission: Secondary | ICD-10-CM | POA: Diagnosis not present

## 2022-01-14 DIAGNOSIS — I69351 Hemiplegia and hemiparesis following cerebral infarction affecting right dominant side: Secondary | ICD-10-CM | POA: Diagnosis not present

## 2022-01-14 DIAGNOSIS — E039 Hypothyroidism, unspecified: Secondary | ICD-10-CM | POA: Diagnosis not present

## 2022-01-14 DIAGNOSIS — I6340 Cerebral infarction due to embolism of unspecified cerebral artery: Secondary | ICD-10-CM | POA: Diagnosis not present

## 2022-01-14 DIAGNOSIS — H9190 Unspecified hearing loss, unspecified ear: Secondary | ICD-10-CM | POA: Diagnosis not present

## 2022-01-14 DIAGNOSIS — E46 Unspecified protein-calorie malnutrition: Secondary | ICD-10-CM | POA: Diagnosis not present

## 2022-01-14 DIAGNOSIS — C439 Malignant melanoma of skin, unspecified: Secondary | ICD-10-CM | POA: Diagnosis not present

## 2022-01-14 DIAGNOSIS — Z8546 Personal history of malignant neoplasm of prostate: Secondary | ICD-10-CM | POA: Diagnosis not present

## 2022-01-14 DIAGNOSIS — C7951 Secondary malignant neoplasm of bone: Secondary | ICD-10-CM | POA: Diagnosis not present

## 2022-01-14 DIAGNOSIS — D696 Thrombocytopenia, unspecified: Secondary | ICD-10-CM | POA: Diagnosis not present

## 2022-01-14 DIAGNOSIS — R269 Unspecified abnormalities of gait and mobility: Secondary | ICD-10-CM | POA: Diagnosis not present

## 2022-01-14 DIAGNOSIS — R8290 Unspecified abnormal findings in urine: Secondary | ICD-10-CM | POA: Diagnosis not present

## 2022-01-23 ENCOUNTER — Inpatient Hospital Stay: Payer: Medicare Other | Attending: Internal Medicine

## 2022-01-23 ENCOUNTER — Other Ambulatory Visit: Payer: Self-pay

## 2022-01-23 ENCOUNTER — Inpatient Hospital Stay: Payer: Medicare Other

## 2022-01-23 VITALS — BP 100/50 | HR 80 | Temp 97.6°F | Resp 18 | Wt 141.5 lb

## 2022-01-23 DIAGNOSIS — Z85820 Personal history of malignant melanoma of skin: Secondary | ICD-10-CM | POA: Insufficient documentation

## 2022-01-23 DIAGNOSIS — Z8546 Personal history of malignant neoplasm of prostate: Secondary | ICD-10-CM | POA: Diagnosis not present

## 2022-01-23 DIAGNOSIS — Z8673 Personal history of transient ischemic attack (TIA), and cerebral infarction without residual deficits: Secondary | ICD-10-CM | POA: Diagnosis not present

## 2022-01-23 DIAGNOSIS — R5383 Other fatigue: Secondary | ICD-10-CM | POA: Diagnosis not present

## 2022-01-23 DIAGNOSIS — C9000 Multiple myeloma not having achieved remission: Secondary | ICD-10-CM | POA: Diagnosis not present

## 2022-01-23 DIAGNOSIS — Z923 Personal history of irradiation: Secondary | ICD-10-CM | POA: Insufficient documentation

## 2022-01-23 DIAGNOSIS — Z85828 Personal history of other malignant neoplasm of skin: Secondary | ICD-10-CM | POA: Insufficient documentation

## 2022-01-23 DIAGNOSIS — C7951 Secondary malignant neoplasm of bone: Secondary | ICD-10-CM | POA: Insufficient documentation

## 2022-01-23 LAB — CMP (CANCER CENTER ONLY)
ALT: 18 U/L (ref 0–44)
AST: 20 U/L (ref 15–41)
Albumin: 3.9 g/dL (ref 3.5–5.0)
Alkaline Phosphatase: 69 U/L (ref 38–126)
Anion gap: 7 (ref 5–15)
BUN: 24 mg/dL — ABNORMAL HIGH (ref 8–23)
CO2: 27 mmol/L (ref 22–32)
Calcium: 9.5 mg/dL (ref 8.9–10.3)
Chloride: 104 mmol/L (ref 98–111)
Creatinine: 1.1 mg/dL (ref 0.61–1.24)
GFR, Estimated: >60 mL/min (ref >60)
Glucose, Bld: 105 mg/dL — ABNORMAL HIGH (ref 70–99)
Potassium: 4.1 mmol/L (ref 3.5–5.1)
Sodium: 138 mmol/L (ref 135–145)
Total Bilirubin: 0.4 mg/dL (ref 0.3–1.2)
Total Protein: 6.9 g/dL (ref 6.5–8.1)

## 2022-01-23 LAB — CBC WITH DIFFERENTIAL (CANCER CENTER ONLY)
Abs Immature Granulocytes: 0.02 10*3/uL (ref 0.00–0.07)
Basophils Absolute: 0 10*3/uL (ref 0.0–0.1)
Basophils Relative: 0 %
Eosinophils Absolute: 0.2 10*3/uL (ref 0.0–0.5)
Eosinophils Relative: 4 %
HCT: 33 % — ABNORMAL LOW (ref 39.0–52.0)
Hemoglobin: 11 g/dL — ABNORMAL LOW (ref 13.0–17.0)
Immature Granulocytes: 0 %
Lymphocytes Relative: 15 %
Lymphs Abs: 0.9 10*3/uL (ref 0.7–4.0)
MCH: 29 pg (ref 26.0–34.0)
MCHC: 33.3 g/dL (ref 30.0–36.0)
MCV: 87.1 fL (ref 80.0–100.0)
Monocytes Absolute: 0.5 10*3/uL (ref 0.1–1.0)
Monocytes Relative: 8 %
Neutro Abs: 4.4 10*3/uL (ref 1.7–7.7)
Neutrophils Relative %: 73 %
Platelet Count: 194 10*3/uL (ref 150–400)
RBC: 3.79 MIL/uL — ABNORMAL LOW (ref 4.22–5.81)
RDW: 15.4 % (ref 11.5–15.5)
WBC Count: 6.1 10*3/uL (ref 4.0–10.5)
nRBC: 0 % (ref 0.0–0.2)

## 2022-01-23 LAB — LACTATE DEHYDROGENASE: LDH: 158 U/L (ref 98–192)

## 2022-01-23 MED ORDER — SODIUM CHLORIDE 0.9 % IV SOLN: INTRAVENOUS | Status: DC

## 2022-01-23 MED ORDER — ZOLEDRONIC ACID 4 MG/100ML IV SOLN
4.0000 mg | Freq: Once | INTRAVENOUS | Status: AC
  Administered 2022-01-23: 4 mg via INTRAVENOUS
  Filled 2022-01-23: qty 100

## 2022-01-23 NOTE — Patient Instructions (Signed)

## 2022-01-23 NOTE — Progress Notes (Signed)
Per wife- no dental issues. Corrected CA 9.58 today

## 2022-01-24 LAB — KAPPA/LAMBDA LIGHT CHAINS
Kappa free light chain: 28.5 mg/L — ABNORMAL HIGH (ref 3.3–19.4)
Kappa, lambda light chain ratio: 1.79 — ABNORMAL HIGH (ref 0.26–1.65)
Lambda free light chains: 15.9 mg/L (ref 5.7–26.3)

## 2022-01-24 LAB — IGG, IGA, IGM
IgA: 160 mg/dL (ref 61–437)
IgG (Immunoglobin G), Serum: 966 mg/dL (ref 603–1613)
IgM (Immunoglobulin M), Srm: 80 mg/dL (ref 15–143)

## 2022-01-25 LAB — BETA 2 MICROGLOBULIN, SERUM: Beta-2 Microglobulin: 2.9 mg/L — ABNORMAL HIGH (ref 0.6–2.4)

## 2022-02-10 DIAGNOSIS — S8011XA Contusion of right lower leg, initial encounter: Secondary | ICD-10-CM | POA: Diagnosis not present

## 2022-02-14 DIAGNOSIS — L309 Dermatitis, unspecified: Secondary | ICD-10-CM | POA: Diagnosis not present

## 2022-03-27 ENCOUNTER — Other Ambulatory Visit: Payer: Self-pay

## 2022-03-27 ENCOUNTER — Encounter (HOSPITAL_COMMUNITY): Payer: Self-pay | Admitting: Emergency Medicine

## 2022-03-27 ENCOUNTER — Emergency Department (HOSPITAL_COMMUNITY)
Admission: EM | Admit: 2022-03-27 | Discharge: 2022-03-28 | Disposition: A | Discharge status: Home / Self Care | Payer: Medicare Other | Attending: Emergency Medicine | Admitting: Emergency Medicine

## 2022-03-27 ENCOUNTER — Emergency Department (HOSPITAL_COMMUNITY): Payer: Medicare Other

## 2022-03-27 DIAGNOSIS — S2241XA Multiple fractures of ribs, right side, initial encounter for closed fracture: Secondary | ICD-10-CM | POA: Diagnosis not present

## 2022-03-27 DIAGNOSIS — J90 Pleural effusion, not elsewhere classified: Secondary | ICD-10-CM | POA: Insufficient documentation

## 2022-03-27 DIAGNOSIS — I700 Atherosclerosis of aorta: Secondary | ICD-10-CM | POA: Diagnosis not present

## 2022-03-27 DIAGNOSIS — S2231XA Fracture of one rib, right side, initial encounter for closed fracture: Secondary | ICD-10-CM | POA: Diagnosis not present

## 2022-03-27 DIAGNOSIS — S32028A Other fracture of second lumbar vertebra, initial encounter for closed fracture: Secondary | ICD-10-CM | POA: Diagnosis not present

## 2022-03-27 DIAGNOSIS — S32009A Unspecified fracture of unspecified lumbar vertebra, initial encounter for closed fracture: Secondary | ICD-10-CM | POA: Diagnosis not present

## 2022-03-27 DIAGNOSIS — S3991XA Unspecified injury of abdomen, initial encounter: Secondary | ICD-10-CM | POA: Diagnosis not present

## 2022-03-27 DIAGNOSIS — M25551 Pain in right hip: Secondary | ICD-10-CM | POA: Insufficient documentation

## 2022-03-27 DIAGNOSIS — S32038A Other fracture of third lumbar vertebra, initial encounter for closed fracture: Secondary | ICD-10-CM | POA: Diagnosis not present

## 2022-03-27 DIAGNOSIS — Z981 Arthrodesis status: Secondary | ICD-10-CM | POA: Diagnosis not present

## 2022-03-27 DIAGNOSIS — W19XXXA Unspecified fall, initial encounter: Secondary | ICD-10-CM | POA: Diagnosis not present

## 2022-03-27 DIAGNOSIS — K8689 Other specified diseases of pancreas: Secondary | ICD-10-CM | POA: Diagnosis not present

## 2022-03-27 DIAGNOSIS — M40204 Unspecified kyphosis, thoracic region: Secondary | ICD-10-CM | POA: Diagnosis not present

## 2022-03-27 DIAGNOSIS — J9811 Atelectasis: Secondary | ICD-10-CM | POA: Diagnosis not present

## 2022-03-27 DIAGNOSIS — S32018A Other fracture of first lumbar vertebra, initial encounter for closed fracture: Secondary | ICD-10-CM | POA: Diagnosis not present

## 2022-03-27 DIAGNOSIS — S300XXA Contusion of lower back and pelvis, initial encounter: Secondary | ICD-10-CM | POA: Diagnosis not present

## 2022-03-27 DIAGNOSIS — S299XXA Unspecified injury of thorax, initial encounter: Secondary | ICD-10-CM | POA: Diagnosis not present

## 2022-03-27 DIAGNOSIS — J479 Bronchiectasis, uncomplicated: Secondary | ICD-10-CM | POA: Insufficient documentation

## 2022-03-27 DIAGNOSIS — Q433 Congenital malformations of intestinal fixation: Secondary | ICD-10-CM | POA: Diagnosis not present

## 2022-03-27 LAB — CBC WITH DIFFERENTIAL/PLATELET
Abs Immature Granulocytes: 0.02 10*3/uL (ref 0.00–0.07)
Basophils Absolute: 0 10*3/uL (ref 0.0–0.1)
Basophils Relative: 0 %
Eosinophils Absolute: 0.2 10*3/uL (ref 0.0–0.5)
Eosinophils Relative: 2 %
HCT: 34.7 % — ABNORMAL LOW (ref 39.0–52.0)
Hemoglobin: 11 g/dL — ABNORMAL LOW (ref 13.0–17.0)
Immature Granulocytes: 0 %
Lymphocytes Relative: 14 %
Lymphs Abs: 0.9 10*3/uL (ref 0.7–4.0)
MCH: 28.1 pg (ref 26.0–34.0)
MCHC: 31.7 g/dL (ref 30.0–36.0)
MCV: 88.5 fL (ref 80.0–100.0)
Monocytes Absolute: 0.6 10*3/uL (ref 0.1–1.0)
Monocytes Relative: 9 %
Neutro Abs: 5.1 10*3/uL (ref 1.7–7.7)
Neutrophils Relative %: 75 %
Platelets: 190 10*3/uL (ref 150–400)
RBC: 3.92 MIL/uL — ABNORMAL LOW (ref 4.22–5.81)
RDW: 16.4 % — ABNORMAL HIGH (ref 11.5–15.5)
WBC: 6.8 10*3/uL (ref 4.0–10.5)
nRBC: 0 % (ref 0.0–0.2)

## 2022-03-27 LAB — BASIC METABOLIC PANEL
Anion gap: 3 — ABNORMAL LOW (ref 5–15)
BUN: 25 mg/dL — ABNORMAL HIGH (ref 8–23)
CO2: 29 mmol/L (ref 22–32)
Calcium: 8.7 mg/dL — ABNORMAL LOW (ref 8.9–10.3)
Chloride: 105 mmol/L (ref 98–111)
Creatinine, Ser: 1.29 mg/dL — ABNORMAL HIGH (ref 0.61–1.24)
GFR, Estimated: 53 mL/min — ABNORMAL LOW (ref >60)
Glucose, Bld: 138 mg/dL — ABNORMAL HIGH (ref 70–99)
Potassium: 3.6 mmol/L (ref 3.5–5.1)
Sodium: 137 mmol/L (ref 135–145)

## 2022-03-27 NOTE — ED Provider Triage Note (Signed)
Emergency Medicine Provider Triage Evaluation Note  Jeremiah Owens , a 88 y.o. male  was evaluated in triage.  Pt complains of hip pain. Report he fell while in the shower 5 days ago. Having pain to R hip since.  Pain worse with movement, having trouble bearing weight.  Sts he had xray done today and was sent here for further eval.  No headache, back pain, numbness or weakness  Review of Systems  Positive: As above Negative: As above  Physical Exam  BP 121/60   Pulse 94   Temp 97.8 F (36.6 C) (Oral)   Resp 16   SpO2 96%  Gen:   Awake, no distress   Resp:  Normal effort  MSK:   Moves extremities without difficulty  Other:    Medical Decision Making  Medically screening exam initiated at 4:17 PM.  Appropriate orders placed.  Jeremiah Owens was informed that the remainder of the evaluation will be completed by another provider, this initial triage assessment does not replace that evaluation, and the importance of remaining in the ED until their evaluation is complete.     Domenic Moras, PA-C 03/27/22 1622

## 2022-03-27 NOTE — ED Triage Notes (Signed)
Pt with right hip pain since fall on Saturday.  Pt states he has had x-rays today but not sure where.  Pt is HOH.  Pt states no pain while sitting still but has terrible pain when he moves.  Reports only other injury is abrasion to right forearm.

## 2022-03-28 ENCOUNTER — Emergency Department (HOSPITAL_COMMUNITY): Payer: Medicare Other

## 2022-03-28 DIAGNOSIS — M40204 Unspecified kyphosis, thoracic region: Secondary | ICD-10-CM | POA: Diagnosis not present

## 2022-03-28 DIAGNOSIS — J479 Bronchiectasis, uncomplicated: Secondary | ICD-10-CM | POA: Diagnosis not present

## 2022-03-28 DIAGNOSIS — S2231XA Fracture of one rib, right side, initial encounter for closed fracture: Secondary | ICD-10-CM | POA: Diagnosis not present

## 2022-03-28 DIAGNOSIS — S3991XA Unspecified injury of abdomen, initial encounter: Secondary | ICD-10-CM | POA: Diagnosis not present

## 2022-03-28 DIAGNOSIS — R531 Weakness: Secondary | ICD-10-CM | POA: Diagnosis not present

## 2022-03-28 DIAGNOSIS — J9811 Atelectasis: Secondary | ICD-10-CM | POA: Diagnosis not present

## 2022-03-28 DIAGNOSIS — Z7401 Bed confinement status: Secondary | ICD-10-CM | POA: Diagnosis not present

## 2022-03-28 DIAGNOSIS — J90 Pleural effusion, not elsewhere classified: Secondary | ICD-10-CM | POA: Diagnosis not present

## 2022-03-28 DIAGNOSIS — Q433 Congenital malformations of intestinal fixation: Secondary | ICD-10-CM | POA: Diagnosis not present

## 2022-03-28 DIAGNOSIS — K8689 Other specified diseases of pancreas: Secondary | ICD-10-CM | POA: Diagnosis not present

## 2022-03-28 DIAGNOSIS — S2241XA Multiple fractures of ribs, right side, initial encounter for closed fracture: Secondary | ICD-10-CM | POA: Diagnosis not present

## 2022-03-28 LAB — URINALYSIS, ROUTINE W REFLEX MICROSCOPIC
Bilirubin Urine: NEGATIVE
Glucose, UA: NEGATIVE mg/dL
Hgb urine dipstick: NEGATIVE
Ketones, ur: NEGATIVE mg/dL
Leukocytes,Ua: NEGATIVE
Nitrite: NEGATIVE
Protein, ur: NEGATIVE mg/dL
Specific Gravity, Urine: 1.036 — ABNORMAL HIGH (ref 1.005–1.030)
pH: 6 (ref 5.0–8.0)

## 2022-03-28 MED ORDER — IOHEXOL 300 MG/ML  SOLN
100.0000 mL | Freq: Once | INTRAMUSCULAR | Status: AC | PRN
  Administered 2022-03-28: 80 mL via INTRAVENOUS

## 2022-03-28 MED ORDER — MORPHINE SULFATE 15 MG PO TABS: 7.5000 mg | ORAL_TABLET | ORAL | 0 refills | Status: DC | PRN

## 2022-03-28 MED ORDER — DICLOFENAC SODIUM 1 % EX GEL: 4.0000 g | Freq: Four times a day (QID) | CUTANEOUS | 0 refills | Status: DC

## 2022-03-28 NOTE — ED Provider Notes (Signed)
[facility] EMERGENCY DEPARTMENT Provider Note   CSN: [id number] Arrival date & time: 03/27/22  1527     History  Chief Complaint  Patient presents with   Hip Pain    Jeremiah Owens is a 88 y.o. male.  88 yo M with a chief complaint of a fall.  This was about 5 days ago.  The patient was in the shower and lost his balance and fell.  Fell onto his right side.  Complaining mostly of pain to the right ribs.  Had gone to an urgent care center and had an x-ray of the chest and there is some concern for a pleural effusion and was sent over here for CT scan of the chest.  Patient is pretty comfortable when he is not moving.  When he gets up and tries to move around he has pain on that side and tells me that the little bit worse in his hip.  Denies any abdominal pain denies head injury denies confusion.   Hip Pain       Home Medications Prior to Admission medications   Medication Sig Start Date End Date Taking? Authorizing Provider  diclofenac Sodium (VOLTAREN) 1 % GEL Apply 4 g topically 4 (four) times daily. 03/28/22  Yes Deno Etienne, DO  morphine (MSIR) 15 MG tablet Take 0.5 tablets (7.5 mg total) by mouth every 4 (four) hours as needed for severe pain. 03/28/22  Yes Deno Etienne, DO  aspirin 81 MG EC tablet Take 81 mg by mouth daily. 03/29/15   [provider]  Calcium Carbonate-Vitamin D (CALTRATE 600+D PO) Take 600 mg by mouth 2 (two) times daily.    [provider]  cholecalciferol (VITAMIN D) 1000 UNITS tablet Take 1,000 Units by mouth at bedtime.     [provider]  diphenoxylate-atropine (LOMOTIL) 2.5-0.025 MG tablet Take 2 tablets by mouth 4 (four) times daily as needed for diarrhea or loose stools. 04/01/16   Curt Bears, MD  furosemide (LASIX) 20 MG tablet Take 20 mg by mouth daily. Pt takes 1 tablet daily except on Mon and Thurs takes 2 tablets    [provider]  hydrocortisone 2.5 % lotion Apply 1 application topically at  bedtime as needed. 03/24/20   [provider]  KLOR-CON 10 10 MEQ tablet Take 10 mEq by mouth daily. 03/21/15   [provider]  levothyroxine (SYNTHROID) 125 MCG tablet 1 tablet in the morning on an empty stomach 08/01/20   [provider]  methocarbamol (ROBAXIN) 500 MG tablet TAKE 1 TABLET EVERY 6 HOURS AS NEEDED FOR MUSCLE SPASMS 03/31/18   Meredith Staggers, MD  Multiple Vitamins-Minerals (CENTRUM SILVER PO) Take 1 tablet by mouth daily.    [provider]  omeprazole (PRILOSEC) 40 MG capsule Take 40 mg by mouth daily as needed (heartburn).     [provider]  oxybutynin (DITROPAN) 5 MG tablet TK 1 T PO ONCE D 03/15/19   [provider]  saccharomyces boulardii (FLORASTOR) 250 MG capsule Take 250 mg by mouth 2 (two) times daily.    [provider]  tamsulosin (FLOMAX) 0.4 MG CAPS capsule Take 1 capsule (0.4 mg total) by mouth at bedtime. 02/21/15   Angiulli, Lavon Paganini, PA-C  TEXACORT 2.5 % SOLN APP TO THE SCALP HS PRN 07/02/17   [provider]      Allergies    Iodine    Review of Systems   Review of Systems  Physical Exam Updated Vital  Signs BP 123/68 (BP Location: Left Arm)   Pulse 88   Temp 97.6 F (36.4 C) (Oral)   Resp 18   SpO2 93%  Physical Exam Vitals and nursing note reviewed.  Constitutional:      Appearance: He is well-developed.  HENT:     Head: Normocephalic and atraumatic.  Eyes:     Pupils: Pupils are equal, round, and reactive to light.  Neck:     Vascular: No JVD.  Cardiovascular:     Rate and Rhythm: Normal rate and regular rhythm.     Heart sounds: No murmur heard.    No friction rub. No gallop.  Pulmonary:     Effort: No respiratory distress.     Breath sounds: No wheezing.  Abdominal:     General: There is no distension.     Tenderness: There is no abdominal tenderness. There is no guarding or rebound.  Musculoskeletal:        General: Tenderness present. Normal range of  motion.     Cervical back: Normal range of motion and neck supple.     Comments: Pain mostly at the right posterior rib angles about ribs 6 through 10.  There is some bruising overlying the flank as well.  No obvious midline spinal tenderness step-offs or deformities.  Skin:    Coloration: Skin is not pale.     Findings: No rash.  Neurological:     Mental Status: He is alert and oriented to person, place, and time.  Psychiatric:        Behavior: Behavior normal.     ED Results / Procedures / Treatments   Labs (all labs ordered are listed, but only abnormal results are displayed) Labs Reviewed  CBC WITH DIFFERENTIAL/PLATELET - Abnormal; Notable for the following components:      Result Value   RBC 3.92 (*)    Hemoglobin 11.0 (*)    HCT 34.7 (*)    RDW 16.4 (*)    All other components within normal limits  URINALYSIS, ROUTINE W REFLEX MICROSCOPIC - Abnormal; Notable for the following components:   Specific Gravity, Urine 1.036 (*)    All other components within normal limits  BASIC METABOLIC PANEL - Abnormal; Notable for the following components:   Glucose, Bld 138 (*)    BUN 25 (*)    Creatinine, Ser 1.29 (*)    Calcium 8.7 (*)    GFR, Estimated 53 (*)    Anion gap 3 (*)    All other components within normal limits    EKG None  Radiology CT CHEST ABDOMEN PELVIS W CONTRAST  Result Date: 03/28/2022 CLINICAL DATA:  88 year old male status post fall 5 days ago with continued pain. EXAM: CT CHEST, ABDOMEN, AND PELVIS WITH CONTRAST TECHNIQUE: Multidetector CT imaging of the chest, abdomen and pelvis was performed following the standard protocol during bolus administration of intravenous contrast. RADIATION DOSE REDUCTION: This exam was performed according to the departmental dose-optimization program which includes automated exposure control, adjustment of the mA and/or kV according to patient size and/or use of iterative reconstruction technique. CONTRAST:  35m OMNIPAQUE IOHEXOL  300 MG/ML  SOLN COMPARISON:  Right hip CT 03/27/2022. Cervical spine CT 10/19/2021. CTA chest 06/17/2014. CT thoracic spine 10/17/2014. FINDINGS: CT CHEST FINDINGS Cardiovascular: No cardiomegaly or pericardial effusion. Extensive aortic valve calcification versus valve replacement. Left coronary artery calcified plaque. Comparatively mild atherosclerosis of the thoracic aorta. Central mediastinal vascular structures appear intact. No thoracic aortic aneurysm or dissection. Proximal great vessels  are highly tortuous. Mediastinum/Nodes: No mediastinal hematoma. Small reactive appearing mediastinal lymph nodes. Lungs/Pleura: Sub pulmonic and layering right pleural effusion with simple fluid density suggesting transudate. Small to moderate effusion size. Major airways remain patent with perihilar bronchiectasis which has progressed since 2015. Enhancing right lung compressive atelectasis. Mild superimposed bilateral lung scarring. No pneumothorax. No left pleural effusion. Musculoskeletal: T5 vertebra plana redemonstrated, and was severely compressed in 2015 also. Posterior decompression at that level. And posterior bilateral T4 through T7 fusion hardware is new since that time, partially visible on the recent cervical spine CT. Severe upper thoracic kyphosis redemonstrated. C7 and T2 vertebral body hemangiomas suspected and were present in 2015. Similar T10 vertebral lesion was present in 2015. T3 mild superior endplate compression is unchanged from the recent cervical spine. Maintained thoracic vertebral height elsewhere. Nondisplaced acute right 9th rib fracture on series 14, image 108. Several chronic rib fractures otherwise (posterior left 9th rib). Chronic appearing medial left clavicle fracture is un healed. Sternum appears intact. No other acute osseous abnormality identified. CT ABDOMEN PELVIS FINDINGS Hepatobiliary: Liver and gallbladder appear intact. Pancreas: Partially atrophied. Spleen: Intact.  No  perisplenic fluid. Adrenals/Urinary Tract: Adrenal glands and kidneys appear intact. Nonobstructed kidneys with symmetric renal enhancement and contrast excretion. Small simple renal cysts (no follow-up imaging recommended). Unremarkable bladder. Stomach/Bowel: Redundant large bowel in the pelvis with retained stool. Retained low-density stool throughout the upstream large bowel. No large bowel inflammation. No dilated small bowel. Decompressed stomach and proximal duodenum. No free air or free fluid. Vascular/Lymphatic: Aortoiliac calcified atherosclerosis. Normal caliber abdominal aorta. Major arterial branches in the abdomen and pelvis are patent. Portal venous system appears patent. No lymphadenopathy identified. Reproductive: Sequelae of prostate brachytherapy. Other: No pelvic free fluid. Musculoskeletal: Normal lumbar segmentation. Mildly displaced right lumbar transverse process fractures L1 through L3. Superimposed dextroconvex lumbar scoliosis. Maintained lumbar vertebral height. L1 probable benign hemangioma similar to the thoracic lesions described above. Bilateral sacrum and SI joints appear intact. No acute pelvis fracture identified. No acute proximal femur fracture identified. IMPRESSION: 1. Nondisplaced acute right 9th rib fracture. Small to moderate right pleural effusion with compressive atelectasis. 2. Mildly displaced right lumbar transverse process fractures L1 through L3. 3. No other acute traumatic injury identified in the chest, abdomen, or pelvis. 4. Chronic T5 vertebra plana with pronounced thoracic kyphosis, previous posterior decompression, and posterior fusion from T4 through T7. 5. Bronchiectasis. Aortic Atherosclerosis (ICD10-I70.0). Electronically Signed   By: Genevie Ann M.D.   On: 03/28/2022 04:13   CT Hip Right Wo Contrast  Result Date: 03/27/2022 CLINICAL DATA:  Right hip pain since falling 5 days ago. EXAM: CT OF THE RIGHT HIP WITHOUT CONTRAST TECHNIQUE: Multidetector CT imaging  of the right hip was performed according to the standard protocol. Multiplanar CT image reconstructions were also generated. RADIATION DOSE REDUCTION: This exam was performed according to the departmental dose-optimization program which includes automated exposure control, adjustment of the mA and/or kV according to patient size and/or use of iterative reconstruction technique. COMPARISON:  Radiographs same date. FINDINGS: Bones/Joint/Cartilage The bones are demineralized. There is no evidence of acute fracture or dislocation. No evidence of femoral head osteonecrosis. Mild degenerative changes of the right hip without significant hip joint effusion. Scattered osseous spurring of the greater trochanter and superior pubic rami. There are small bone islands in the proximal right femur. No evidence of metastatic disease. Ligaments Suboptimally assessed by CT. Muscles and Tendons Unremarkable. Soft tissues Prostate brachytherapy seeds are noted. No evidence of periarticular hematoma,  unexpected foreign body or soft tissue emphysema. IMPRESSION: 1. No evidence of acute right hip fracture or dislocation. 2. Mild degenerative changes with scattered spurring as described. 3. No significant soft tissue findings. Electronically Signed   By: Richardean Sale M.D.   On: 03/27/2022 18:25    Procedures Procedures    Medications Ordered in ED Medications  iohexol (OMNIPAQUE) 300 MG/ML solution 100 mL (80 mLs Intravenous Contrast Given 03/28/22 0353)    ED Course/ Medical Decision Making/ A&P                           Medical Decision Making Amount and/or Complexity of Data Reviewed Radiology: ordered.  Risk Prescription drug management.   88 yo M with a chief complaints of right chest wall pain after a fall.  He fell about 5 days ago.  Has had pain especially with ambulation getting up from a seated position.  He went to an [place] urgent care today and had a plain film of his chest concerning for possible  pleural effusion.  Sent over here for CT imaging.  The patient had a CT scan of his hip ordered and the MSE process.  Negative for fracture.  Patient's pain is more at the rib angles but does extend down the low back.  We will obtain a CT scan of the chest abdomen pelvis.  CT scan of the chest and pelvis with an isolated right-sided rib fracture with a pleural effusion.  Patient also with L1-2 and 3 transverse process fractures.  Placed in a TLSO.  Given follow-up with neurosurgery.  6:34 AM:  I have discussed the diagnosis/risks/treatment options with the patient and family.  Evaluation and diagnostic testing in the emergency department does not suggest an emergent condition requiring admission or immediate intervention beyond what has been performed at this time.  They will follow up with  PCP, neurosurgery. We also discussed returning to the ED immediately if new or worsening sx occur. We discussed the sx which are most concerning (e.g., sudden worsening pain, fever, inability to tolerate by mouth) that necessitate immediate return. Medications administered to the patient during their visit and any new prescriptions provided to the patient are listed below.  Medications given during this visit Medications  iohexol (OMNIPAQUE) 300 MG/ML solution 100 mL (80 mLs Intravenous Contrast Given 03/28/22 0353)     The patient appears reasonably screen and/or stabilized for discharge and I doubt any other medical condition or other [facility] requiring further screening, evaluation, or treatment in the ED at this time prior to discharge.           Final Clinical Impression(s) / ED Diagnoses Final diagnoses:  Lumbar transverse process fracture, closed, initial encounter [facility])  Closed fracture of one rib of right side, initial encounter    Rx / DC Orders ED Discharge Orders          Ordered    diclofenac Sodium (VOLTAREN) 1 % GEL  4 times daily        03/28/22 0616    morphine (MSIR) 15 MG tablet   Every 4 hours PRN        03/28/22 0616              Deno Etienne, DO 03/28/22 [phone]

## 2022-03-28 NOTE — ED Notes (Signed)
This EMT along with RN did a full linen change as well as change the patient's brief. Warm blankets were provided for the patient as well as family at bedside.

## 2022-03-28 NOTE — Discharge Instructions (Addendum)
Use the gel as prescribed Also take tylenol '1000mg'$ (2 extra strength) four times a day.   Use the incentive spirometer 10 minutes out of every hour when you are awake.  Please return for difficulty breathing fever or cough.  Then take the pain medicine if you feel like you need it. Narcotics do not help with the pain, they only make you care about it less.  You can become addicted to this, people may break into your house to steal it.  It will constipate you.  If you drive under the influence of this medicine you can get a DUI.

## 2022-03-28 NOTE — ED Notes (Signed)
Patient verbalizes understanding of discharge instructions. Opportunity for questioning and answers were provided. Armband removed by staff, pt discharged from ED via [facility] to go home.    

## 2022-04-14 DIAGNOSIS — S32009S Unspecified fracture of unspecified lumbar vertebra, sequela: Secondary | ICD-10-CM | POA: Diagnosis not present

## 2022-04-15 DIAGNOSIS — I6340 Cerebral infarction due to embolism of unspecified cerebral artery: Secondary | ICD-10-CM | POA: Diagnosis not present

## 2022-04-15 DIAGNOSIS — S2239XA Fracture of one rib, unspecified side, initial encounter for closed fracture: Secondary | ICD-10-CM | POA: Diagnosis not present

## 2022-04-15 DIAGNOSIS — Z8546 Personal history of malignant neoplasm of prostate: Secondary | ICD-10-CM | POA: Diagnosis not present

## 2022-04-15 DIAGNOSIS — D696 Thrombocytopenia, unspecified: Secondary | ICD-10-CM | POA: Diagnosis not present

## 2022-04-15 DIAGNOSIS — R269 Unspecified abnormalities of gait and mobility: Secondary | ICD-10-CM | POA: Diagnosis not present

## 2022-04-15 DIAGNOSIS — Z23 Encounter for immunization: Secondary | ICD-10-CM | POA: Diagnosis not present

## 2022-04-15 DIAGNOSIS — E039 Hypothyroidism, unspecified: Secondary | ICD-10-CM | POA: Diagnosis not present

## 2022-04-15 DIAGNOSIS — S32000A Wedge compression fracture of unspecified lumbar vertebra, initial encounter for closed fracture: Secondary | ICD-10-CM | POA: Diagnosis not present

## 2022-04-15 DIAGNOSIS — E46 Unspecified protein-calorie malnutrition: Secondary | ICD-10-CM | POA: Diagnosis not present

## 2022-04-15 DIAGNOSIS — C9000 Multiple myeloma not having achieved remission: Secondary | ICD-10-CM | POA: Diagnosis not present

## 2022-04-15 DIAGNOSIS — C7951 Secondary malignant neoplasm of bone: Secondary | ICD-10-CM | POA: Diagnosis not present

## 2022-04-15 DIAGNOSIS — I69351 Hemiplegia and hemiparesis following cerebral infarction affecting right dominant side: Secondary | ICD-10-CM | POA: Diagnosis not present

## 2022-04-16 ENCOUNTER — Inpatient Hospital Stay: Payer: Medicare Other | Attending: Internal Medicine

## 2022-04-16 ENCOUNTER — Other Ambulatory Visit: Payer: Self-pay

## 2022-04-16 DIAGNOSIS — Z8673 Personal history of transient ischemic attack (TIA), and cerebral infarction without residual deficits: Secondary | ICD-10-CM | POA: Insufficient documentation

## 2022-04-16 DIAGNOSIS — Z85828 Personal history of other malignant neoplasm of skin: Secondary | ICD-10-CM | POA: Insufficient documentation

## 2022-04-16 DIAGNOSIS — C9000 Multiple myeloma not having achieved remission: Secondary | ICD-10-CM | POA: Insufficient documentation

## 2022-04-16 DIAGNOSIS — Z85820 Personal history of malignant melanoma of skin: Secondary | ICD-10-CM | POA: Diagnosis not present

## 2022-04-16 DIAGNOSIS — Z8546 Personal history of malignant neoplasm of prostate: Secondary | ICD-10-CM | POA: Insufficient documentation

## 2022-04-16 DIAGNOSIS — Z923 Personal history of irradiation: Secondary | ICD-10-CM | POA: Diagnosis not present

## 2022-04-16 DIAGNOSIS — R5383 Other fatigue: Secondary | ICD-10-CM | POA: Diagnosis not present

## 2022-04-16 DIAGNOSIS — C7951 Secondary malignant neoplasm of bone: Secondary | ICD-10-CM | POA: Diagnosis not present

## 2022-04-16 LAB — CMP (CANCER CENTER ONLY)
ALT: 19 U/L (ref 0–44)
AST: 24 U/L (ref 15–41)
Albumin: 4 g/dL (ref 3.5–5.0)
Alkaline Phosphatase: 96 U/L (ref 38–126)
Anion gap: 5 (ref 5–15)
BUN: 25 mg/dL — ABNORMAL HIGH (ref 8–23)
CO2: 29 mmol/L (ref 22–32)
Calcium: 9.5 mg/dL (ref 8.9–10.3)
Chloride: 103 mmol/L (ref 98–111)
Creatinine: 1.18 mg/dL (ref 0.61–1.24)
GFR, Estimated: 59 mL/min — ABNORMAL LOW (ref >60)
Glucose, Bld: 121 mg/dL — ABNORMAL HIGH (ref 70–99)
Potassium: 4 mmol/L (ref 3.5–5.1)
Sodium: 137 mmol/L (ref 135–145)
Total Bilirubin: 0.5 mg/dL (ref 0.3–1.2)
Total Protein: 6.9 g/dL (ref 6.5–8.1)

## 2022-04-16 LAB — CBC WITH DIFFERENTIAL (CANCER CENTER ONLY)
Abs Immature Granulocytes: 0.03 10*3/uL (ref 0.00–0.07)
Basophils Absolute: 0 10*3/uL (ref 0.0–0.1)
Basophils Relative: 0 %
Eosinophils Absolute: 0.2 10*3/uL (ref 0.0–0.5)
Eosinophils Relative: 2 %
HCT: 36.9 % — ABNORMAL LOW (ref 39.0–52.0)
Hemoglobin: 11.8 g/dL — ABNORMAL LOW (ref 13.0–17.0)
Immature Granulocytes: 0 %
Lymphocytes Relative: 8 %
Lymphs Abs: 0.6 10*3/uL — ABNORMAL LOW (ref 0.7–4.0)
MCH: 27.4 pg (ref 26.0–34.0)
MCHC: 32 g/dL (ref 30.0–36.0)
MCV: 85.8 fL (ref 80.0–100.0)
Monocytes Absolute: 0.6 10*3/uL (ref 0.1–1.0)
Monocytes Relative: 9 %
Neutro Abs: 5.7 10*3/uL (ref 1.7–7.7)
Neutrophils Relative %: 81 %
Platelet Count: 205 10*3/uL (ref 150–400)
RBC: 4.3 MIL/uL (ref 4.22–5.81)
RDW: 16.3 % — ABNORMAL HIGH (ref 11.5–15.5)
WBC Count: 7.1 10*3/uL (ref 4.0–10.5)
nRBC: 0 % (ref 0.0–0.2)

## 2022-04-16 LAB — LACTATE DEHYDROGENASE: LDH: 181 U/L (ref 98–192)

## 2022-04-17 ENCOUNTER — Other Ambulatory Visit: Payer: TRICARE For Life (TFL)

## 2022-04-17 LAB — KAPPA/LAMBDA LIGHT CHAINS
Kappa free light chain: 29.7 mg/L — ABNORMAL HIGH (ref 3.3–19.4)
Kappa, lambda light chain ratio: 1.63 (ref 0.26–1.65)
Lambda free light chains: 18.2 mg/L (ref 5.7–26.3)

## 2022-04-18 LAB — BETA 2 MICROGLOBULIN, SERUM: Beta-2 Microglobulin: 3 mg/L — ABNORMAL HIGH (ref 0.6–2.4)

## 2022-04-18 LAB — IGG, IGA, IGM
IgA: 187 mg/dL (ref 61–437)
IgG (Immunoglobin G), Serum: 1161 mg/dL (ref 603–1613)
IgM (Immunoglobulin M), Srm: 76 mg/dL (ref 15–143)

## 2022-04-23 ENCOUNTER — Inpatient Hospital Stay (HOSPITAL_BASED_OUTPATIENT_CLINIC_OR_DEPARTMENT_OTHER): Payer: Medicare Other | Admitting: Internal Medicine

## 2022-04-23 ENCOUNTER — Encounter: Payer: Self-pay | Admitting: Internal Medicine

## 2022-04-23 ENCOUNTER — Other Ambulatory Visit: Payer: Self-pay

## 2022-04-23 ENCOUNTER — Inpatient Hospital Stay: Payer: Medicare Other

## 2022-04-23 VITALS — BP 122/67 | HR 100 | Temp 98.1°F | Resp 16 | Wt 149.0 lb

## 2022-04-23 VITALS — BP 110/65 | HR 87 | Resp 17

## 2022-04-23 DIAGNOSIS — R5383 Other fatigue: Secondary | ICD-10-CM | POA: Diagnosis not present

## 2022-04-23 DIAGNOSIS — Z85820 Personal history of malignant melanoma of skin: Secondary | ICD-10-CM | POA: Diagnosis not present

## 2022-04-23 DIAGNOSIS — C7951 Secondary malignant neoplasm of bone: Secondary | ICD-10-CM

## 2022-04-23 DIAGNOSIS — C9000 Multiple myeloma not having achieved remission: Secondary | ICD-10-CM

## 2022-04-23 DIAGNOSIS — Z8546 Personal history of malignant neoplasm of prostate: Secondary | ICD-10-CM | POA: Diagnosis not present

## 2022-04-23 DIAGNOSIS — Z923 Personal history of irradiation: Secondary | ICD-10-CM | POA: Diagnosis not present

## 2022-04-23 MED ORDER — SODIUM CHLORIDE 0.9 % IV SOLN: INTRAVENOUS | Status: DC

## 2022-04-23 MED ORDER — ZOLEDRONIC ACID 4 MG/100ML IV SOLN
4.0000 mg | Freq: Once | INTRAVENOUS | Status: AC
  Administered 2022-04-23: 4 mg via INTRAVENOUS
  Filled 2022-04-23: qty 100

## 2022-04-23 NOTE — Progress Notes (Signed)
[facility] [phone]   [phone]  OFFICE PROGRESS NOTE  Wenda Low, MD [address] [place] [address]  DIAGNOSIS: Multiple myeloma diagnosed in November 2015.  PRIOR THERAPY: 1) Status post Thoracic five laminectomy for epidural tumor resection, Thoracic 4-7 posterior lateral arthrodesis,   segmental pedicle screw fixation T4-T7 Globus instrumentation under the care of Dr. Christella Noa on 06/18/2014. 2) Systemic chemotherapy with Velcade 1.3 MG/M2 subcutaneously weekly in addition to Decadron 40 mg by mouth on weekly basis. Status post 11 cycles. 3) palliative radiotherapy to the right proximal humerus, shoulder and scapula under the care of Dr. Lisbeth Renshaw completed on 12/12/2014. 4)  Systemic chemotherapy with Velcade 1.3 MG/M2 subcutaneously weekly, Decadron 40 mg by mouth weekly in addition to Revlimid 25 MG by mouth daily for 21 days every 4 weeks, status post 14 cycles.   CURRENT THERAPY: Zometa 4 mg IV every 12 weeks. First dose was given 11/01/2014.  INTERVAL HISTORY: Jeremiah Owens 88 y.o. male returns to the clinic today for follow-up visit.  He was accompanied by his wife.  The patient is feeling fine today with no concerning complaints except for the baseline fatigue and arthralgia.  He was seen at the emergency department 3 months ago after a fall at home and he had imaging studies of the chest, abdomen and pelvis that showed nondisplaced acute right ninth rib fracture with a small to moderate right pleural effusion with compressive atelectasis and mildly displaced right lumbar transverse process fracture at L1-L3 and chronic T5 vertebral plana with pronounced thoracic kyphosis.  The patient denied having any current chest pain, shortness of breath, cough or hemoptysis.  He has no nausea, vomiting, diarrhea or constipation.  He is here today for evaluation with repeat myeloma panel.  MEDICAL HISTORY: Past Medical History:   Diagnosis Date   Allergy    Anxiety    Bone cancer ([facility])    t_spine T-5   Compression fracture    Encounter for antineoplastic chemotherapy 01/31/2015   Hearing loss    Melanoma ([facility])    Multiple myeloma ([facility])    Prostate cancer ([facility]) 2002   S/P radiation therapy 08/21/14-09/04/14   T4-6 25Gy/25f   S/P radiation therapy 11/29/14-12/12/14   rt prox humerus/shoulder/scapula 25Gy/161f  Skin cancer 1994   melanoma  right neck    Stroke ([facility]   Thyroid disease     ALLERGIES:  is allergic to iodine.  MEDICATIONS:  Current Outpatient Medications  Medication Sig Dispense Refill   aspirin 81 MG EC tablet Take 81 mg by mouth daily.  0   Calcium Carbonate-Vitamin D (CALTRATE 600+D PO) Take 600 mg by mouth 2 (two) times daily.     cholecalciferol (VITAMIN D) 1000 UNITS tablet Take 1,000 Units by mouth at bedtime.      diclofenac Sodium (VOLTAREN) 1 % GEL Apply 4 g topically 4 (four) times daily. 100 g 0   diphenoxylate-atropine (LOMOTIL) 2.5-0.025 MG tablet Take 2 tablets by mouth 4 (four) times daily as needed for diarrhea or loose stools. 30 tablet 0   furosemide (LASIX) 20 MG tablet Take 20 mg by mouth daily. Pt takes 1 tablet daily except on Mon and Thurs takes 2 tablets     hydrocortisone 2.5 % lotion Apply 1 application topically at bedtime as needed.     KLOR-CON 10 10 MEQ tablet Take 10 mEq by mouth daily.     levothyroxine (SYNTHROID) 125 MCG tablet 1  tablet in the morning on an empty stomach     methocarbamol (ROBAXIN) 500 MG tablet TAKE 1 TABLET EVERY 6 HOURS AS NEEDED FOR MUSCLE SPASMS 180 tablet 4   morphine (MSIR) 15 MG tablet Take 0.5 tablets (7.5 mg total) by mouth every 4 (four) hours as needed for severe pain. 5 tablet 0   Multiple Vitamins-Minerals (CENTRUM SILVER PO) Take 1 tablet by mouth daily.     omeprazole (PRILOSEC) 40 MG capsule Take 40 mg by mouth daily as needed (heartburn).      oxybutynin (DITROPAN) 5 MG tablet TK 1 T PO ONCE D     saccharomyces boulardii  (FLORASTOR) 250 MG capsule Take 250 mg by mouth 2 (two) times daily.     tamsulosin (FLOMAX) 0.4 MG CAPS capsule Take 1 capsule (0.4 mg total) by mouth at bedtime. 30 capsule 3   TEXACORT 2.5 % SOLN APP TO THE SCALP HS PRN  2   No current facility-administered medications for this visit.   Facility-Administered Medications Ordered in Other Visits  Medication Dose Route Frequency Provider Last Rate Last Admin   sodium chloride 0.9 % injection 10 mL  10 mL Intracatheter PRN Curt Bears, MD        SURGICAL HISTORY:  Past Surgical History:  Procedure Laterality Date   HERNIA REPAIR     POSTERIOR CERVICAL FUSION/FORAMINOTOMY N/A 06/17/2014   Procedure: Thoracic five laminectomy for epidural tumor resection Thoracic 4-7 posterior lateral arthrodesis, segmental pedicle screw fixation.;  Surgeon: Ashok Pall, MD;  Location: [facility];  Service: Neurosurgery;  Laterality: N/A;    REVIEW OF SYSTEMS:  A comprehensive review of systems was negative except for: Constitutional: positive for fatigue Musculoskeletal: positive for arthralgias and muscle weakness   PHYSICAL EXAMINATION: General appearance: alert, cooperative, fatigued, and no distress Head: Normocephalic, without obvious abnormality, atraumatic Neck: no adenopathy, no JVD, supple, symmetrical, trachea midline, and thyroid not enlarged, symmetric, no tenderness/mass/nodules Lymph nodes: Cervical, supraclavicular, and axillary nodes normal. Resp: clear to auscultation bilaterally Back: symmetric, no curvature. ROM normal. No CVA tenderness. Cardio: regular rate and rhythm, S1, S2 normal, no murmur, click, rub or gallop GI: soft, non-tender; bowel sounds normal; no masses,  no organomegaly Extremities: extremities normal, atraumatic, no cyanosis or edema  ECOG PERFORMANCE STATUS: 1 - Symptomatic but completely ambulatory  Blood pressure 122/67, pulse 100, temperature 98.1 F (36.7 C), temperature source Oral, resp. rate 16,  weight 149 lb (67.6 kg), SpO2 100 %.  LABORATORY DATA: Lab Results  Component Value Date   WBC 7.1 04/16/2022   HGB 11.8 (L) 04/16/2022   HCT 36.9 (L) 04/16/2022   MCV 85.8 04/16/2022   PLT 205 04/16/2022      Chemistry      Component Value Date/Time   NA 137 04/16/2022 1036   NA 141 07/15/2017 0949   K 4.0 04/16/2022 1036   K 4.4 07/15/2017 0949   CL 103 04/16/2022 1036   CO2 29 04/16/2022 1036   CO2 27 07/15/2017 0949   BUN 25 (H) 04/16/2022 1036   BUN 18.0 07/15/2017 0949   CREATININE 1.18 04/16/2022 1036   CREATININE 1.1 07/15/2017 0949      Component Value Date/Time   CALCIUM 9.5 04/16/2022 1036   CALCIUM 9.2 07/15/2017 0949   ALKPHOS 96 04/16/2022 1036   ALKPHOS 56 07/15/2017 0949   AST 24 04/16/2022 1036   AST 20 07/15/2017 0949   ALT 19 04/16/2022 1036   ALT 16 07/15/2017 0949   BILITOT 0.5  04/16/2022 1036   BILITOT 0.61 07/15/2017 0949       RADIOGRAPHIC STUDIES: CT CHEST ABDOMEN PELVIS W CONTRAST  Result Date: 03/28/2022 CLINICAL DATA:  88 year old male status post fall 5 days ago with continued pain. EXAM: CT CHEST, ABDOMEN, AND PELVIS WITH CONTRAST TECHNIQUE: Multidetector CT imaging of the chest, abdomen and pelvis was performed following the standard protocol during bolus administration of intravenous contrast. RADIATION DOSE REDUCTION: This exam was performed according to the departmental dose-optimization program which includes automated exposure control, adjustment of the mA and/or kV according to patient size and/or use of iterative reconstruction technique. CONTRAST:  54m OMNIPAQUE IOHEXOL 300 MG/ML  SOLN COMPARISON:  Right hip CT 03/27/2022. Cervical spine CT 10/19/2021. CTA chest 06/17/2014. CT thoracic spine 10/17/2014. FINDINGS: CT CHEST FINDINGS Cardiovascular: No cardiomegaly or pericardial effusion. Extensive aortic valve calcification versus valve replacement. Left coronary artery calcified plaque. Comparatively mild atherosclerosis of the  thoracic aorta. Central mediastinal vascular structures appear intact. No thoracic aortic aneurysm or dissection. Proximal great vessels are highly tortuous. Mediastinum/Nodes: No mediastinal hematoma. Small reactive appearing mediastinal lymph nodes. Lungs/Pleura: Sub pulmonic and layering right pleural effusion with simple fluid density suggesting transudate. Small to moderate effusion size. Major airways remain patent with perihilar bronchiectasis which has progressed since 2015. Enhancing right lung compressive atelectasis. Mild superimposed bilateral lung scarring. No pneumothorax. No left pleural effusion. Musculoskeletal: T5 vertebra plana redemonstrated, and was severely compressed in 2015 also. Posterior decompression at that level. And posterior bilateral T4 through T7 fusion hardware is new since that time, partially visible on the recent cervical spine CT. Severe upper thoracic kyphosis redemonstrated. C7 and T2 vertebral body hemangiomas suspected and were present in 2015. Similar T10 vertebral lesion was present in 2015. T3 mild superior endplate compression is unchanged from the recent cervical spine. Maintained thoracic vertebral height elsewhere. Nondisplaced acute right 9th rib fracture on series 14, image 108. Several chronic rib fractures otherwise (posterior left 9th rib). Chronic appearing medial left clavicle fracture is un healed. Sternum appears intact. No other acute osseous abnormality identified. CT ABDOMEN PELVIS FINDINGS Hepatobiliary: Liver and gallbladder appear intact. Pancreas: Partially atrophied. Spleen: Intact.  No perisplenic fluid. Adrenals/Urinary Tract: Adrenal glands and kidneys appear intact. Nonobstructed kidneys with symmetric renal enhancement and contrast excretion. Small simple renal cysts (no follow-up imaging recommended). Unremarkable bladder. Stomach/Bowel: Redundant large bowel in the pelvis with retained stool. Retained low-density stool throughout the upstream  large bowel. No large bowel inflammation. No dilated small bowel. Decompressed stomach and proximal duodenum. No free air or free fluid. Vascular/Lymphatic: Aortoiliac calcified atherosclerosis. Normal caliber abdominal aorta. Major arterial branches in the abdomen and pelvis are patent. Portal venous system appears patent. No lymphadenopathy identified. Reproductive: Sequelae of prostate brachytherapy. Other: No pelvic free fluid. Musculoskeletal: Normal lumbar segmentation. Mildly displaced right lumbar transverse process fractures L1 through L3. Superimposed dextroconvex lumbar scoliosis. Maintained lumbar vertebral height. L1 probable benign hemangioma similar to the thoracic lesions described above. Bilateral sacrum and SI joints appear intact. No acute pelvis fracture identified. No acute proximal femur fracture identified. IMPRESSION: 1. Nondisplaced acute right 9th rib fracture. Small to moderate right pleural effusion with compressive atelectasis. 2. Mildly displaced right lumbar transverse process fractures L1 through L3. 3. No other acute traumatic injury identified in the chest, abdomen, or pelvis. 4. Chronic T5 vertebra plana with pronounced thoracic kyphosis, previous posterior decompression, and posterior fusion from T4 through T7. 5. Bronchiectasis. Aortic Atherosclerosis (ICD10-I70.0). Electronically Signed   By: HHerminio HeadsD.  On: 03/28/2022 04:13   CT Hip Right Wo Contrast  Result Date: 03/27/2022 CLINICAL DATA:  Right hip pain since falling 5 days ago. EXAM: CT OF THE RIGHT HIP WITHOUT CONTRAST TECHNIQUE: Multidetector CT imaging of the right hip was performed according to the standard protocol. Multiplanar CT image reconstructions were also generated. RADIATION DOSE REDUCTION: This exam was performed according to the departmental dose-optimization program which includes automated exposure control, adjustment of the mA and/or kV according to patient size and/or use of iterative reconstruction  technique. COMPARISON:  Radiographs same date. FINDINGS: Bones/Joint/Cartilage The bones are demineralized. There is no evidence of acute fracture or dislocation. No evidence of femoral head osteonecrosis. Mild degenerative changes of the right hip without significant hip joint effusion. Scattered osseous spurring of the greater trochanter and superior pubic rami. There are small bone islands in the proximal right femur. No evidence of metastatic disease. Ligaments Suboptimally assessed by CT. Muscles and Tendons Unremarkable. Soft tissues Prostate brachytherapy seeds are noted. No evidence of periarticular hematoma, unexpected foreign body or soft tissue emphysema. IMPRESSION: 1. No evidence of acute right hip fracture or dislocation. 2. Mild degenerative changes with scattered spurring as described. 3. No significant soft tissue findings. Electronically Signed   By: Richardean Sale M.D.   On: 03/27/2022 18:25    ASSESSMENT AND PLAN:  This is a very pleasant 88 years old white male with history of multiple myeloma diagnosed in November 2015 status post several chemotherapy regimens as well as radiation. The patient has been in observation for more than 7 years now. He had repeat myeloma panel that showed no concerning findings for disease progression. I recommended for him to continue his current treatment with Zometa infusion every 3 months. I will see him back for follow-up visit in 6 months with repeat myeloma panel. The patient was advised to call immediately if he has any other concerning symptoms in the interval. The patient voices understanding of current disease status and treatment options and is in agreement with the current care plan.  All questions were answered. The patient knows to call the clinic with any problems, questions or concerns. We can certainly see the patient much sooner if necessary.  Disclaimer: This note was dictated with voice recognition software. Similar sounding words  can inadvertently be transcribed and may not be corrected upon review.

## 2022-04-23 NOTE — Patient Instructions (Signed)

## 2022-04-24 ENCOUNTER — Ambulatory Visit: Payer: TRICARE For Life (TFL)

## 2022-04-24 ENCOUNTER — Ambulatory Visit: Payer: TRICARE For Life (TFL) | Admitting: Internal Medicine

## 2022-05-04 ENCOUNTER — Encounter: Payer: Self-pay | Admitting: Internal Medicine

## 2022-05-07 ENCOUNTER — Encounter: Payer: Self-pay | Admitting: Physical Medicine & Rehabilitation

## 2022-05-07 ENCOUNTER — Encounter: Payer: Medicare Other | Attending: Physical Medicine & Rehabilitation | Admitting: Physical Medicine & Rehabilitation

## 2022-05-07 VITALS — BP 113/72 | HR 89 | Ht 69.0 in | Wt 150.2 lb

## 2022-05-07 DIAGNOSIS — I6381 Other cerebral infarction due to occlusion or stenosis of small artery: Secondary | ICD-10-CM | POA: Insufficient documentation

## 2022-05-07 DIAGNOSIS — R2689 Other abnormalities of gait and mobility: Secondary | ICD-10-CM | POA: Diagnosis not present

## 2022-05-07 NOTE — Progress Notes (Signed)
Subjective:    Patient ID: Jeremiah Owens, male    DOB: June 01, 1930, 88 y.o.   MRN: [id number]  HPI  Jeremiah Owens is here in follow up of his CVA and multifactorial gait disorder. In august he fell while he was in the bathroom. His wife had just left him and apparently he lost his balance while he was brushing his teeth. He ended up going the ED eventually and was diagnosed with a lumbar transverse process fx as well as a right fib fx, probably from striking the front of the toilet. He has improved since then although he remains a fall risk. He is using his RW for balance and has had no incidences while using the walker.   In the bathroom he now has a grab bar on the wall and a frame on the toilet. He also has a shower seat. He is also receiving hired help for showering and bathing/dressing.   Wife reports more fatigue. He's frequently falling asleep during the day. He is sleeping well at night. He has aides who play games and stimulate him during the day.     Pain Inventory Average Pain 5 Pain Right Now 0 My pain is intermittent and burning  In the last 24 hours, has pain interfered with the following? General activity 0 Relation with others 0 Enjoyment of life 1 What TIME of day is your pain at its worst? daytime Sleep (in general) Good  Pain is worse with: walking Pain improves with: heat/ice and medication Relief from Meds:  Tylenol  Family History  Problem Relation Age of Onset   Cancer Mother        breast   Stroke Mother    Social History   Socioeconomic History   Marital status: Married    Spouse name: Not on file   Number of children: Not on file   Years of education: Not on file   Highest education level: Not on file  Occupational History   Not on file  Tobacco Use   Smoking status: Never   Smokeless tobacco: Never  Vaping Use   Vaping Use: Never used  Substance and Sexual Activity   Alcohol use: No    Alcohol/week: 0.0 standard drinks of alcohol   Drug  use: No   Sexual activity: Not on file  Other Topics Concern   Not on file  Social History Narrative   Not on file   Social Determinants of Health   Financial Resource Strain: Not on file  Food Insecurity: Not on file  Transportation Needs: Not on file  Physical Activity: Not on file  Stress: Not on file  Social Connections: Not on file   Past Surgical History:  Procedure Laterality Date   HERNIA REPAIR     POSTERIOR CERVICAL FUSION/FORAMINOTOMY N/A 06/17/2014   Procedure: Thoracic five laminectomy for epidural tumor resection Thoracic 4-7 posterior lateral arthrodesis, segmental pedicle screw fixation.;  Surgeon: Ashok Pall, MD;  Location: [facility];  Service: Neurosurgery;  Laterality: N/A;   Past Surgical History:  Procedure Laterality Date   HERNIA REPAIR     POSTERIOR CERVICAL FUSION/FORAMINOTOMY N/A 06/17/2014   Procedure: Thoracic five laminectomy for epidural tumor resection Thoracic 4-7 posterior lateral arthrodesis, segmental pedicle screw fixation.;  Surgeon: Ashok Pall, MD;  Location: [facility];  Service: Neurosurgery;  Laterality: N/A;   Past Medical History:  Diagnosis Date   Allergy    Anxiety    Bone cancer ([facility] [place])    t_spine T-5  Compression fracture    Encounter for antineoplastic chemotherapy 01/31/2015   Hearing loss    Melanoma ([facility])    Multiple myeloma ([facility])    Prostate cancer ([place]) 2002   S/P radiation therapy 08/21/14-09/04/14   T4-6 25Gy/61f   S/P radiation therapy 11/29/14-12/12/14   rt prox humerus/shoulder/scapula 25Gy/146f  Skin cancer 1994   melanoma  right neck    Stroke ([facility])    Thyroid disease    BP 113/72   Pulse 89   Ht _0  (1.753 m)   Wt 150 lb 3.2 oz (68.1 kg)   SpO2 97%   BMI 22.18 kg/m   Opioid Risk Score:   Fall Risk Score:  `1  [facility]     05/07/2022   10:56 AM 05/02/2020    1:44 PM 04/10/2015   10:51 AM 11/07/2014   12:09 PM  Depression screen PHQ 2/9  Decreased Interest 0 0 0 0  Down,  Depressed, Hopeless 0 0 0 0  PHQ - 2 Score 0 0 0 0  Altered sleeping  0 1 0  Tired, decreased energy  _1 Change in appetite  0 0 0  Feeling bad or failure about yourself   0 0 0  Trouble concentrating  1 0 1  Moving slowly or fidgety/restless  0 0 0  Suicidal thoughts  0 0 0  PHQ-9 Score  _2 Difficult doing work/chores  Not difficult at all       Review of Systems  Constitutional: Negative.   HENT: Negative.    Eyes: Negative.   Respiratory: Negative.    Cardiovascular: Negative.   Gastrointestinal:  Positive for abdominal pain.  Endocrine: Negative.   Genitourinary: Negative.   Musculoskeletal:  Positive for back pain and gait problem.  Skin: Negative.   Allergic/Immunologic: Negative.   Neurological:  Positive for weakness.  Hematological: Negative.   Psychiatric/Behavioral: Negative.        Objective:   Physical Exam  General: No acute distress HEENT: NCAT, EOMI, oral membranes moist Cards: reg rate  Chest: normal effort Abdomen: Soft, NT, ND Skin: dry, intact Extremities: no edema Psych: pleasant and appropriate  Neuro:  Fair insight. Speech very slurred. Right central 7, drooling. Slower processing. Fa[facility]ois[facility] CenterMemory. Normal language and speech. Cranial nerve exam unremarkable.     RLE 4/5 LLE 4+/5. RUE 5/5. LUE limited. Gait more shuffling, head more forward than previous time I saw.   Musculoskeletal: about 50-70 degrees of passive Left shoulder abduction with pain.            Assessment & Plan:  1.  History of left basal ganglia infarct with right hemiparesis 2.  History of multiple myeloma with thoracic spinal mets 3.  History of BPH 4.  Dysphagia d/t CVA 5.  Multifactorial gait disorder related to the above in addition to his significant kyphotic posture and adhesive capsulitis in the left shoulder.   Recent fall with right TVP and rib fxes. He has shown some decline in his functional mobility and neurological exam since we last met.    Plan: 1.   ROM/exercises at home            -posture discussed too 2.  safety with swallowing discussed. Yogurt.to help with pills--helps with diarrhea  -handkerchief for saliva  -frequent swallowing to clear secretions  3.  Discussed home safety including DME, installed equipment, etc  -would benefit from a chair in front of sink.   -  ii support help in home as well.   4. Continue with leisure activities and stimulation for fatigue.    15 minutes of direct patient time was spent in the office today.  I will see him back here in about 6 mos

## 2022-05-07 NOTE — Patient Instructions (Signed)
PLEASE FEEL FREE TO CALL OUR OFFICE WITH ANY PROBLEMS OR QUESTIONS ([phone])      

## 2022-05-17 ENCOUNTER — Other Ambulatory Visit: Payer: Self-pay | Admitting: Nurse Practitioner

## 2022-05-17 DIAGNOSIS — C7949 Secondary malignant neoplasm of other parts of nervous system: Secondary | ICD-10-CM

## 2022-07-04 ENCOUNTER — Inpatient Hospital Stay (HOSPITAL_COMMUNITY)
Admission: EM | Admit: 2022-07-04 | Discharge: 2022-07-08 | DRG: 690 | Disposition: A | Discharge status: Skilled Nursing Facility | Payer: No Typology Code available for payment source | Source: Ambulatory Visit | Attending: Internal Medicine | Admitting: Internal Medicine

## 2022-07-04 ENCOUNTER — Emergency Department (HOSPITAL_COMMUNITY): Payer: No Typology Code available for payment source

## 2022-07-04 ENCOUNTER — Other Ambulatory Visit: Payer: Self-pay

## 2022-07-04 ENCOUNTER — Encounter (HOSPITAL_COMMUNITY): Payer: Self-pay

## 2022-07-04 DIAGNOSIS — Z9221 Personal history of antineoplastic chemotherapy: Secondary | ICD-10-CM

## 2022-07-04 DIAGNOSIS — C9000 Multiple myeloma not having achieved remission: Secondary | ICD-10-CM | POA: Diagnosis present

## 2022-07-04 DIAGNOSIS — Z923 Personal history of irradiation: Secondary | ICD-10-CM

## 2022-07-04 DIAGNOSIS — Z9181 History of falling: Secondary | ICD-10-CM

## 2022-07-04 DIAGNOSIS — I69391 Dysphagia following cerebral infarction: Secondary | ICD-10-CM

## 2022-07-04 DIAGNOSIS — F419 Anxiety disorder, unspecified: Secondary | ICD-10-CM | POA: Diagnosis present

## 2022-07-04 DIAGNOSIS — Z87440 Personal history of urinary (tract) infections: Secondary | ICD-10-CM

## 2022-07-04 DIAGNOSIS — N3000 Acute cystitis without hematuria: Principal | ICD-10-CM | POA: Diagnosis present

## 2022-07-04 DIAGNOSIS — N1831 Chronic kidney disease, stage 3a: Secondary | ICD-10-CM | POA: Diagnosis present

## 2022-07-04 DIAGNOSIS — N390 Urinary tract infection, site not specified: Secondary | ICD-10-CM | POA: Diagnosis not present

## 2022-07-04 DIAGNOSIS — J90 Pleural effusion, not elsewhere classified: Secondary | ICD-10-CM | POA: Insufficient documentation

## 2022-07-04 DIAGNOSIS — Z79890 Hormone replacement therapy (postmenopausal): Secondary | ICD-10-CM

## 2022-07-04 DIAGNOSIS — J9811 Atelectasis: Secondary | ICD-10-CM | POA: Diagnosis present

## 2022-07-04 DIAGNOSIS — Z66 Do not resuscitate: Secondary | ICD-10-CM | POA: Diagnosis present

## 2022-07-04 DIAGNOSIS — I350 Nonrheumatic aortic (valve) stenosis: Secondary | ICD-10-CM | POA: Diagnosis present

## 2022-07-04 DIAGNOSIS — R54 Age-related physical debility: Secondary | ICD-10-CM | POA: Diagnosis present

## 2022-07-04 DIAGNOSIS — Z8673 Personal history of transient ischemic attack (TIA), and cerebral infarction without residual deficits: Secondary | ICD-10-CM | POA: Diagnosis not present

## 2022-07-04 DIAGNOSIS — R9431 Abnormal electrocardiogram [ECG] [EKG]: Secondary | ICD-10-CM | POA: Diagnosis not present

## 2022-07-04 DIAGNOSIS — D631 Anemia in chronic kidney disease: Secondary | ICD-10-CM | POA: Diagnosis present

## 2022-07-04 DIAGNOSIS — Z79899 Other long term (current) drug therapy: Secondary | ICD-10-CM

## 2022-07-04 DIAGNOSIS — Z85820 Personal history of malignant melanoma of skin: Secondary | ICD-10-CM

## 2022-07-04 DIAGNOSIS — Z85828 Personal history of other malignant neoplasm of skin: Secondary | ICD-10-CM

## 2022-07-04 DIAGNOSIS — R531 Weakness: Secondary | ICD-10-CM | POA: Diagnosis not present

## 2022-07-04 DIAGNOSIS — H9190 Unspecified hearing loss, unspecified ear: Secondary | ICD-10-CM | POA: Diagnosis present

## 2022-07-04 DIAGNOSIS — Z515 Encounter for palliative care: Secondary | ICD-10-CM

## 2022-07-04 DIAGNOSIS — Z823 Family history of stroke: Secondary | ICD-10-CM

## 2022-07-04 DIAGNOSIS — C7951 Secondary malignant neoplasm of bone: Secondary | ICD-10-CM | POA: Diagnosis present

## 2022-07-04 DIAGNOSIS — E039 Hypothyroidism, unspecified: Secondary | ICD-10-CM | POA: Diagnosis present

## 2022-07-04 DIAGNOSIS — N400 Enlarged prostate without lower urinary tract symptoms: Secondary | ICD-10-CM | POA: Diagnosis present

## 2022-07-04 DIAGNOSIS — B964 Proteus (mirabilis) (morganii) as the cause of diseases classified elsewhere: Secondary | ICD-10-CM | POA: Diagnosis present

## 2022-07-04 DIAGNOSIS — Z7982 Long term (current) use of aspirin: Secondary | ICD-10-CM

## 2022-07-04 DIAGNOSIS — Z803 Family history of malignant neoplasm of breast: Secondary | ICD-10-CM

## 2022-07-04 DIAGNOSIS — Z8546 Personal history of malignant neoplasm of prostate: Secondary | ICD-10-CM

## 2022-07-04 DIAGNOSIS — I493 Ventricular premature depolarization: Secondary | ICD-10-CM | POA: Diagnosis present

## 2022-07-04 LAB — URINALYSIS, ROUTINE W REFLEX MICROSCOPIC
Bilirubin Urine: NEGATIVE
Glucose, UA: NEGATIVE mg/dL
Hgb urine dipstick: NEGATIVE
Ketones, ur: NEGATIVE mg/dL
Nitrite: NEGATIVE
Protein, ur: 100 mg/dL — AB
Specific Gravity, Urine: 1.021 (ref 1.005–1.030)
WBC, UA: >50 WBC/hpf — ABNORMAL HIGH (ref 0–5)
pH: 8 (ref 5.0–8.0)

## 2022-07-04 LAB — CBC WITH DIFFERENTIAL/PLATELET
Abs Immature Granulocytes: 0.02 10*3/uL (ref 0.00–0.07)
Basophils Absolute: 0 10*3/uL (ref 0.0–0.1)
Basophils Relative: 0 %
Eosinophils Absolute: 0.1 10*3/uL (ref 0.0–0.5)
Eosinophils Relative: 2 %
HCT: 35.5 % — ABNORMAL LOW (ref 39.0–52.0)
Hemoglobin: 11.2 g/dL — ABNORMAL LOW (ref 13.0–17.0)
Immature Granulocytes: 0 %
Lymphocytes Relative: 11 %
Lymphs Abs: 0.8 10*3/uL (ref 0.7–4.0)
MCH: 27.9 pg (ref 26.0–34.0)
MCHC: 31.5 g/dL (ref 30.0–36.0)
MCV: 88.3 fL (ref 80.0–100.0)
Monocytes Absolute: 0.7 10*3/uL (ref 0.1–1.0)
Monocytes Relative: 9 %
Neutro Abs: 6 10*3/uL (ref 1.7–7.7)
Neutrophils Relative %: 78 %
Platelets: 256 10*3/uL (ref 150–400)
RBC: 4.02 MIL/uL — ABNORMAL LOW (ref 4.22–5.81)
RDW: 16.6 % — ABNORMAL HIGH (ref 11.5–15.5)
WBC: 7.6 10*3/uL (ref 4.0–10.5)
nRBC: 0 % (ref 0.0–0.2)

## 2022-07-04 LAB — COMPREHENSIVE METABOLIC PANEL
ALT: 30 U/L (ref 0–44)
AST: 33 U/L (ref 15–41)
Albumin: 3.1 g/dL — ABNORMAL LOW (ref 3.5–5.0)
Alkaline Phosphatase: 82 U/L (ref 38–126)
Anion gap: 11 (ref 5–15)
BUN: 31 mg/dL — ABNORMAL HIGH (ref 8–23)
CO2: 26 mmol/L (ref 22–32)
Calcium: 9.6 mg/dL (ref 8.9–10.3)
Chloride: 106 mmol/L (ref 98–111)
Creatinine, Ser: 1.3 mg/dL — ABNORMAL HIGH (ref 0.61–1.24)
GFR, Estimated: 52 mL/min — ABNORMAL LOW (ref >60)
Glucose, Bld: 96 mg/dL (ref 70–99)
Potassium: 4.2 mmol/L (ref 3.5–5.1)
Sodium: 143 mmol/L (ref 135–145)
Total Bilirubin: 0.5 mg/dL (ref 0.3–1.2)
Total Protein: 6.7 g/dL (ref 6.5–8.1)

## 2022-07-04 LAB — TROPONIN I (HIGH SENSITIVITY)
Troponin I (High Sensitivity): 19 ng/L — ABNORMAL HIGH (ref <18)
Troponin I (High Sensitivity): 19 ng/L — ABNORMAL HIGH (ref <18)

## 2022-07-04 MED ORDER — SODIUM CHLORIDE 0.9 % IV SOLN
1.0000 g | Freq: Once | INTRAVENOUS | Status: AC
  Administered 2022-07-04: 1 g via INTRAVENOUS
  Filled 2022-07-04: qty 10

## 2022-07-04 NOTE — ED Provider Notes (Signed)
[facility] EMERGENCY DEPARTMENT Provider Note   CSN: [id number] Arrival date & time: 07/04/22  1441     History {Add pertinent medical, surgical, social history, OB history to HPI:1} Chief Complaint  Patient presents with  . possible UTI    Dravon Nott is a 88 y.o. male.  HPI The patient is a 88 year old male with past medical history of prostate cancer (s/p treatment with residual bowel and bladder incontinence), recurrent UTIs, prior CVA, multiple myeloma who is presenting by EMS as a transfer from the [place] for concern for a urinary tract infection.  Per the patient's wife who is at bedside, the patient has had several days of decreased energy and oral intake.  She has also noticed that he has had a foul-smelling urine and has been complaining of pain with urination and pain in his penis.  He also had an episode of nocturnal enuresis the other night which is atypical for him.  The patient and his wife deny recent fevers, nausea, vomiting, chest pain.  The patient's wife is also concerned because she states that the patient has had rapid shallow breathing lately.  The patient denies dyspnea.  The patient is a 88 year old male with past medical history of prostate cancer (s/p treatment with residual bowel and bladder incontinence), recurrent UTIs, prior CVA, multiple myeloma who is presenting by EMS as a transfer from the [place] for concern for a urinary tract infection.  The differential diagnosis considered includes: Urinary tract infection, epididymitis, prostatitis, pyelonephritis.  On initial presentation, the patient is hemodynamically stable and afebrile.  On physical exam, the patient is noted to have incontinence of bowel and bladder with an erythematous penis consistent with skin irritation.  There is no signs of discharge from the urethral meatus or swelling.  The patient's testicles are nontender to palpation associated scrotal swelling but there is sign of skin  irritation on the scrotum as well.  The patient's diagnostic workup includes: A CBC with white blood cell count of 7.6 and hemoglobin of 11.2; CMP with BUN elevated 31, creatinine of 1.30; serial troponins elevated to 19 and 19 respectively; urinalysis with moderate leukocytes, greater than 50 urine WBCs, and many bacteria.   Home Medications Prior to Admission medications   Medication Sig Start Date End Date Taking? Authorizing Provider  aspirin 81 MG EC tablet Take 81 mg by mouth daily. 03/29/15   [provider]  Calcium Carbonate-Vitamin D (CALTRATE 600+D PO) Take 600 mg by mouth 2 (two) times daily.    [provider]  cholecalciferol (VITAMIN D) 1000 UNITS tablet Take 1,000 Units by mouth at bedtime.     [provider]  diclofenac Sodium (VOLTAREN) 1 % GEL Apply 4 g topically 4 (four) times daily. 03/28/22   Deno Etienne, DO  diphenoxylate-atropine (LOMOTIL) 2.5-0.025 MG tablet Take 2 tablets by mouth 4 (four) times daily as needed for diarrhea or loose stools. 04/01/16   Curt Bears, MD  furosemide (LASIX) 20 MG tablet Take 20 mg by mouth daily. Pt takes 1 tablet daily except on Mon and Thurs takes 2 tablets    [provider]  hydrocortisone 2.5 % lotion Apply 1 application topically at bedtime as needed. 03/24/20   [provider]  KLOR-CON 10 10 MEQ tablet Take 10 mEq by mouth daily. 03/21/15   [provider]  levothyroxine (SYNTHROID) 125 MCG tablet 1 tablet in the morning on an empty stomach 08/01/20   [provider]  methocarbamol (ROBAXIN) 500 MG  tablet TAKE 1 TABLET EVERY 6 HOURS AS NEEDED FOR MUSCLE SPASMS 03/31/18   Meredith Staggers, MD  morphine (MSIR) 15 MG tablet Take 0.5 tablets (7.5 mg total) by mouth every 4 (four) hours as needed for severe pain. Patient not taking: Reported on 05/07/2022 03/28/22   Deno Etienne, DO  Multiple Vitamins-Minerals (CENTRUM SILVER PO) Take 1 tablet by mouth daily.    [provider]  omeprazole (PRILOSEC) 40 MG capsule Take 40 mg by mouth daily as needed (heartburn).     [provider]  oxybutynin (DITROPAN) 5 MG tablet TK 1 T PO ONCE D 03/15/19   [provider]  saccharomyces boulardii (FLORASTOR) 250 MG capsule Take 250 mg by mouth 2 (two) times daily.    [provider]  tamsulosin (FLOMAX) 0.4 MG CAPS capsule Take 1 capsule (0.4 mg total) by mouth at bedtime. 02/21/15   Angiulli, Lavon Paganini, PA-C  TEXACORT 2.5 % SOLN APP TO THE SCALP HS PRN 07/02/17   [provider]      Allergies    Iodine    Review of Systems   Review of Systems  See HPI  Physical Exam Updated Vital Signs BP 115/80   Pulse 95   Temp 98.1 F (36.7 C)   Resp 20   SpO2 98%  Physical Exam Vitals and nursing note reviewed.  Constitutional:      General: He is not in acute distress.    Appearance: He is well-developed.  HENT:     Head: Normocephalic and atraumatic.  Eyes:     Conjunctiva/sclera: Conjunctivae normal.  Cardiovascular:     Rate and Rhythm: Normal rate and regular rhythm.     Heart sounds: No murmur heard. Pulmonary:     Effort: Pulmonary effort is normal. No respiratory distress.     Breath sounds: Normal breath sounds.  Abdominal:     Palpations: Abdomen is soft.     Tenderness: There is no abdominal tenderness.  Genitourinary:    Comments: Penis appears erythematous but is nontender to palpation.  No testicular tenderness to palpation or scrotal swelling. Musculoskeletal:        General: No swelling.     Cervical back: Neck supple.  Skin:    General: Skin is warm and dry.     Capillary Refill: Capillary refill takes less than 2 seconds.  Neurological:     Mental Status: He is alert.  Psychiatric:        Mood and Affect: Mood normal.    ED Results / Procedures / Treatments   Labs (all labs ordered are listed, but only abnormal results are displayed) Labs Reviewed  CBC WITH DIFFERENTIAL/PLATELET -  Abnormal; Notable for the following components:      Result Value   RBC 4.02 (*)    Hemoglobin 11.2 (*)    HCT 35.5 (*)    RDW 16.6 (*)    All other components within normal limits  COMPREHENSIVE METABOLIC PANEL - Abnormal; Notable for the following components:   BUN 31 (*)    Creatinine, Ser 1.30 (*)    Albumin 3.1 (*)    GFR, Estimated 52 (*)    All other components within normal limits  URINALYSIS, ROUTINE W REFLEX MICROSCOPIC - Abnormal; Notable for the following components:   Color, Urine AMBER (*)    APPearance CLOUDY (*)    Protein, ur 100 (*)    Leukocytes,Ua MODERATE (*)    WBC, UA >50 (*)  Bacteria, UA MANY (*)    All other components within normal limits  TROPONIN I (HIGH SENSITIVITY) - Abnormal; Notable for the following components:   Troponin I (High Sensitivity) 19 (*)    All other components within normal limits  TROPONIN I (HIGH SENSITIVITY) - Abnormal; Notable for the following components:   Troponin I (High Sensitivity) 19 (*)    All other components within normal limits  URINE CULTURE    EKG None  Radiology DG Chest 2 View  Result Date: 07/04/2022 CLINICAL DATA:  Weakness. EXAM: CHEST - 2 VIEW COMPARISON:  March 27, 2022. FINDINGS: The heart size and mediastinal contours are within normal limits. Moderate size right pleural effusion is noted with associated atelectasis. Left lung is clear. Status post surgical posterior fusion of midthoracic spine is noted with old midthoracic fracture. IMPRESSION: Moderate size right pleural effusion is noted with associated atelectasis. Electronically Signed   By: Marijo Conception M.D.   On: 07/04/2022 16:02    Procedures Procedures  {Document cardiac monitor, telemetry assessment procedure when appropriate:1}  Medications Ordered in ED Medications - No data to display  ED Course/ Medical Decision Making/ A&P                           Medical Decision Making  ***  {Document critical care time when  appropriate:1} {Document review of labs and clinical decision tools ie heart score, Chads2Vasc2 etc:1}  {Document your independent review of radiology images, and any outside records:1} {Document your discussion with family members, caretakers, and with consultants:1} {Document social determinants of health affecting pt's care:1} {Document your decision making why or why not admission, treatments were needed:1} Final Clinical Impression(s) / ED Diagnoses Final diagnoses:  None    Rx / DC Orders ED Discharge Orders     None

## 2022-07-04 NOTE — ED Triage Notes (Signed)
Pt [facility] from the [place] for a suspected UTI. Per wife patient has had foul smelling urine and has been more tired than usual.

## 2022-07-04 NOTE — ED Provider Triage Note (Signed)
Emergency Medicine Provider Triage Evaluation Note  Jeremiah Owens , a 88 y.o. male  was evaluated in triage.  Pt complains of generalized weakness which began a couple days ago.  History provided by the wife as patient has dementia at baseline.  He has been more fatigued, sleeping more, and appears to have just no energy.  Went to the [place] today for a checkup, they called EMS as patient's O2 sats were low, however his hands were cold per EMS and he had a good waveform to 99% on room air.  No recent sick contacts, no falls  Review of Systems  Positive: weakness Negative: Fever, cough  Physical Exam  There were no vitals taken for this visit. Gen:   Awake, no distress   Resp:  Normal effort  MSK:   Moves extremities without difficulty  Other:   Lungs are slightly diminshed  Medical Decision Making  Medically screening exam initiated at 2:43 PM.  Appropriate orders placed.  Demorio Seeley was informed that the remainder of the evaluation will be completed by another provider, this initial triage assessment does not replace that evaluation, and the importance of remaining in the ED until their evaluation is complete.     Janeece Fitting, PA-C 07/04/22 1454

## 2022-07-05 ENCOUNTER — Encounter (HOSPITAL_COMMUNITY): Payer: Self-pay | Admitting: Family Medicine

## 2022-07-05 DIAGNOSIS — Z7189 Other specified counseling: Secondary | ICD-10-CM | POA: Diagnosis not present

## 2022-07-05 DIAGNOSIS — Z515 Encounter for palliative care: Secondary | ICD-10-CM | POA: Diagnosis not present

## 2022-07-05 DIAGNOSIS — N3000 Acute cystitis without hematuria: Secondary | ICD-10-CM | POA: Diagnosis not present

## 2022-07-05 DIAGNOSIS — J90 Pleural effusion, not elsewhere classified: Secondary | ICD-10-CM | POA: Diagnosis not present

## 2022-07-05 DIAGNOSIS — C9000 Multiple myeloma not having achieved remission: Secondary | ICD-10-CM | POA: Diagnosis not present

## 2022-07-05 DIAGNOSIS — Z8673 Personal history of transient ischemic attack (TIA), and cerebral infarction without residual deficits: Secondary | ICD-10-CM | POA: Diagnosis not present

## 2022-07-05 LAB — BASIC METABOLIC PANEL
Anion gap: 10 (ref 5–15)
BUN: 28 mg/dL — ABNORMAL HIGH (ref 8–23)
CO2: 25 mmol/L (ref 22–32)
Calcium: 9.1 mg/dL (ref 8.9–10.3)
Chloride: 106 mmol/L (ref 98–111)
Creatinine, Ser: 1.18 mg/dL (ref 0.61–1.24)
GFR, Estimated: 59 mL/min — ABNORMAL LOW (ref >60)
Glucose, Bld: 105 mg/dL — ABNORMAL HIGH (ref 70–99)
Potassium: 3.7 mmol/L (ref 3.5–5.1)
Sodium: 141 mmol/L (ref 135–145)

## 2022-07-05 LAB — CBC
HCT: 33.5 % — ABNORMAL LOW (ref 39.0–52.0)
Hemoglobin: 11 g/dL — ABNORMAL LOW (ref 13.0–17.0)
MCH: 27.8 pg (ref 26.0–34.0)
MCHC: 32.8 g/dL (ref 30.0–36.0)
MCV: 84.6 fL (ref 80.0–100.0)
Platelets: 232 10*3/uL (ref 150–400)
RBC: 3.96 MIL/uL — ABNORMAL LOW (ref 4.22–5.81)
RDW: 16.4 % — ABNORMAL HIGH (ref 11.5–15.5)
WBC: 7.7 10*3/uL (ref 4.0–10.5)
nRBC: 0 % (ref 0.0–0.2)

## 2022-07-05 LAB — LACTIC ACID, PLASMA: Lactic Acid, Venous: 1.2 mmol/L (ref 0.5–1.9)

## 2022-07-05 MED ORDER — TAMSULOSIN HCL 0.4 MG PO CAPS
0.4000 mg | ORAL_CAPSULE | Freq: Every day | ORAL | Status: DC
  Administered 2022-07-05 – 2022-07-07 (×4): 0.4 mg via ORAL
  Filled 2022-07-05 (×4): qty 1

## 2022-07-05 MED ORDER — HEPARIN SODIUM (PORCINE) 5000 UNIT/ML IJ SOLN
5000.0000 [IU] | Freq: Three times a day (TID) | INTRAMUSCULAR | Status: DC
  Administered 2022-07-05 – 2022-07-08 (×9): 5000 [IU] via SUBCUTANEOUS
  Filled 2022-07-05 (×9): qty 1

## 2022-07-05 MED ORDER — ONDANSETRON HCL 4 MG/2ML IJ SOLN: 4.0000 mg | Freq: Four times a day (QID) | INTRAMUSCULAR | Status: DC | PRN

## 2022-07-05 MED ORDER — ORAL CARE MOUTH RINSE: 15.0000 mL | OROMUCOSAL | Status: DC | PRN

## 2022-07-05 MED ORDER — DICLOFENAC SODIUM 1 % EX GEL: 2.0000 g | Freq: Every day | CUTANEOUS | Status: DC | PRN

## 2022-07-05 MED ORDER — FUROSEMIDE 20 MG PO TABS: 20.0000 mg | ORAL_TABLET | ORAL | Status: DC

## 2022-07-05 MED ORDER — ACETAMINOPHEN 325 MG PO TABS: 650.0000 mg | ORAL_TABLET | Freq: Four times a day (QID) | ORAL | Status: DC | PRN

## 2022-07-05 MED ORDER — LEVOTHYROXINE SODIUM 25 MCG PO TABS
125.0000 ug | ORAL_TABLET | Freq: Every day | ORAL | Status: DC
  Administered 2022-07-05 – 2022-07-08 (×4): 125 ug via ORAL
  Filled 2022-07-05 (×4): qty 1

## 2022-07-05 MED ORDER — ACETAMINOPHEN 650 MG RE SUPP: 650.0000 mg | Freq: Four times a day (QID) | RECTAL | Status: DC | PRN

## 2022-07-05 MED ORDER — ONDANSETRON HCL 4 MG PO TABS: 4.0000 mg | ORAL_TABLET | Freq: Four times a day (QID) | ORAL | Status: DC | PRN

## 2022-07-05 MED ORDER — FOOD THICKENER (SIMPLYTHICK): 10.0000 | ORAL | Status: DC | PRN

## 2022-07-05 MED ORDER — FUROSEMIDE 20 MG PO TABS: 40.0000 mg | ORAL_TABLET | ORAL | Status: DC

## 2022-07-05 MED ORDER — PANTOPRAZOLE SODIUM 40 MG PO TBEC
40.0000 mg | DELAYED_RELEASE_TABLET | Freq: Every day | ORAL | Status: DC
  Administered 2022-07-05 – 2022-07-08 (×4): 40 mg via ORAL
  Filled 2022-07-05 (×4): qty 1

## 2022-07-05 MED ORDER — SENNOSIDES-DOCUSATE SODIUM 8.6-50 MG PO TABS: 1.0000 | ORAL_TABLET | Freq: Every evening | ORAL | Status: DC | PRN

## 2022-07-05 MED ORDER — ASPIRIN 81 MG PO TBEC
81.0000 mg | DELAYED_RELEASE_TABLET | Freq: Every day | ORAL | Status: DC
  Administered 2022-07-05 – 2022-07-08 (×4): 81 mg via ORAL
  Filled 2022-07-05 (×4): qty 1

## 2022-07-05 MED ORDER — SODIUM CHLORIDE 0.9 % IV SOLN
1.0000 g | INTRAVENOUS | Status: DC
  Administered 2022-07-05 – 2022-07-07 (×3): 1 g via INTRAVENOUS
  Filled 2022-07-05 (×3): qty 10

## 2022-07-05 NOTE — Evaluation (Signed)
Clinical/Bedside Swallow Evaluation Patient Details  Name: Jeremiah Owens MRN: [id number] Date of Birth: [birth year]  Today's Date: 07/05/2022 Time: SLP Start Time (ACUTE ONLY): 36 SLP Stop Time (ACUTE ONLY): 1545 SLP Time Calculation (min) (ACUTE ONLY): 17 min  Past Medical History:  Past Medical History:  Diagnosis Date   Allergy    Anxiety    Bone cancer ([place])    t_spine T-5   Compression fracture    Encounter for antineoplastic chemotherapy 01/31/2015   Hearing loss    Melanoma ([place])    Multiple myeloma ([place])    Prostate cancer ([place]) 2002   S/P radiation therapy 08/21/14-09/04/14   T4-6 25Gy/25f   S/P radiation therapy 11/29/14-12/12/14   rt prox humerus/shoulder/scapula 25Gy/116f  Skin cancer 1994   melanoma  right neck    [place]   Thyroid disease    Past Surgical History:  Past Surgical History:  Procedure Laterality Date   HERNIA REPAIR     POSTERIOR CERVICAL FUSION/FORAMINOTOMY N/A 06/17/2014   Procedure: Thoracic five laminectomy for epidural tumor resection Thoracic 4-7 posterior lateral arthrodesis, segmental pedicle screw fixation.;  Surgeon: KyAshok PallMD;  Location: [facility];  Service: Neurosurgery;  Laterality: N/A;   HPI:  [age].o. male admitted with UTI, weakness and loss of appetite. Pulmonary consulted for right pleural effusion. Palliative Care is following.  Prior medical history significant for multiple myeloma, hypothyroidism, CKD 3A, aortic stenosis, left BG CVA in 2016 and hx of chronic dysphagia, for which he has received therapy as recently as January 2022.  MBS 05/14/20 and 09/17/20 revealed reduced pharyngeal contraction, occasional silent aspiration with thin liquids, incomplete pharyngeal clearance. Pt/family have been advised  in the past that swallowing may be susceptible to acute medical changes.    Assessment / Plan / Recommendation  Clinical Impression  Pt presents with acute on chronic oropharyngeal dysphagia with increased  symptoms of aspiration.  Voice is wet at baseline; there are multiple sub-swallows evident with all boluses (liquid/solid) and overt coughing with trials of thin liquid.  Coughing eliminated with nectars. Recommend starting a dysphagia 2 diet with nectar-thick liquids for now; crush meds in puree.  Discussed with Jeremiah Owens follow for readiness for diet advancement and to determine if there would be value in repeating an MBS. [facility] SLP Visit Diagnosis: Dysphagia, oropharyngeal phase (R13.12)    Aspiration Risk  Moderate aspiration risk    Diet Recommendation   Dysphagia 2, nectar thick liquids  Medication Administration: Crushed with puree    Other  Recommendations Oral Care Recommendations: Oral care BID Other Recommendations: Order thickener from pharmacy    Recommendations for follow up therapy are one component of a multi-disciplinary discharge planning process, led by the attending physician.  Recommendations may be updated based on patient status, additional functional criteria and insurance authorization.  Follow up Recommendations Other (comment) (tba)      Assistance Recommended at Discharge    Functional Status Assessment Patient has had a recent decline in their functional status and demonstrates the ability to make significant improvements in function in a reasonable and predictable amount of time.  Frequency and Duration min 2x/week  2 weeks       Prognosis Prognosis for Safe Diet Advancement: Fair      Swallow Study   General HPI: [age].o. male admitted with UTI, weakness and loss of appetite. Pulmonary consulted for right pleural effusion. Palliative Care is following.  Prior medical history significant  for multiple myeloma, hypothyroidism, CKD 3A, aortic stenosis, left BG CVA in 2016 and hx of chronic dysphagia, for which he has received therapy as recently as January 2022.  MBS 05/14/20 and 09/17/20 revealed reduced pharyngeal  contraction, occasional silent aspiration with thin liquids, incomplete pharyngeal clearance. Pt/family have been advised  in the past that swallowing may be susceptible to acute medical changes. Type of Study: Bedside Swallow Evaluation Previous Swallow Assessment: see HPI Diet Prior to this Study: NPO Temperature Spikes Noted: No Respiratory Status: Room air History of Recent Intubation: No Behavior/Cognition: Alert;Cooperative Oral Cavity Assessment: Within Functional Limits Oral Care Completed by SLP: No Oral Cavity - Dentition: Adequate natural dentition Vision: Functional for self-feeding Self-Feeding Abilities: Needs assist;Able to feed self Patient Positioning: Upright in bed Baseline Vocal Quality: Wet Volitional Cough: Weak Volitional Swallow: Able to elicit    Oral/Motor/Sensory Function Overall Oral Motor/Sensory Function: Other (comment) (mild right facial asymmetry)   Ice Chips Ice chips: Impaired Presentation: Spoon Pharyngeal Phase Impairments: Throat Clearing - Immediate   Thin Liquid Thin Liquid: Impaired Presentation: Spoon Pharyngeal  Phase Impairments: Multiple swallows;Cough - Immediate    Nectar Thick Nectar Thick Liquid: Impaired Presentation: Cup Pharyngeal Phase Impairments: Multiple swallows   Honey Thick Honey Thick Liquid: Not tested   Puree Puree: Impaired Pharyngeal Phase Impairments: Multiple swallows   Solid     Solid: Impaired Oral Phase Functional Implications: Prolonged oral transit Pharyngeal Phase Impairments: Multiple swallows      Jeremiah Owens 07/05/2022,4:03 PM  Virginia Curl L. Tivis Ringer, MA CCC/SLP Clinical Specialist - [place] Office number [phone]

## 2022-07-05 NOTE — Consult Note (Signed)
Consultation Note Date: 07/05/2022   Patient Name: Jeremiah Owens  DOB: [birth year]  MRN: [id number]  Age / Sex: 88 y.o., male  PCP: Wenda Low, MD Referring Physician: Aline August, MD  Reason for Consultation: Establishing goals of care  HPI/Patient Profile: 88 y.o. male  with past medical history of multiple myeloma since 2015/several chemo regimens and radiation treatment, observation status for more than 7 years, treated with Zometa every 3 months, last seen by Dr. Julien Nordmann September 20 of this year, s/p thoracic 5 laminectomy for epidural tumor resection thoracic 4 through 7 with Dr. Christella Noa in 2015, hypothyroid, CKD 3A, aortic stenosis, history of CVA, prostate cancer 2002, melanoma date unknown, admitted on 07/04/2022 with acute UTI/not septic.   Clinical Assessment and Goals of Care: I have reviewed medical records including EPIC notes, labs and imaging, received report from RN, assessed the patient.  [name] is lying quietly in bed.  He appears acutely/chronically ill and very frail.  He greets me, making and mostly keeping eye contact.  He is alert and oriented to person and place, although he thinks it is November.  He is able to make his needs known.  There is no family at bedside at this time.  We talk about what brings him to the hospital.  He talks about fluid on his lungs.  When I mention UTI, he seems confused.  I reassure him.  He is able to tell me that his wife, Jeremiah Owens, helps care for him at home.  He shares that she assists him with toileting.  He tells me that they have paid caregivers 3 times a week.  He is unable to tell me how long they have been married, but shares that their son is in his 50s.  He asks that I call his wife for an update.   I ask about MOLST form completed December of last year.  He tells me that he does not remember completing this form.  CODE STATUS discussions not  held with patient's today due to acuity of his condition, confusion about month.  Will speak with wife.  Face-to-face meeting with bedside nursing staff.  Call to wife, Kyra Manges", to discuss diagnosis prognosis, [place], EOL wishes, disposition and options. I introduced Palliative Medicine as specialized medical care for people living with serious illness. It focuses on providing relief from the symptoms and stress of a serious illness. The goal is to improve quality of life for both the patient and the family.  We discussed a brief life review of the patient.  She agrees that [name] had accurately described their life.  They have been married 21 years, 40 if they make it to January 16.  They have 1 son, Jeremiah Owens, who is an attorney in [place], aged 14.  Mr. Canning worked 30 years with the [employer] and "loved every minute".  Mrs. Falwell states that they do have paid caregivers 3 times a week.  She has been helping him toilet since his spinal surgery.  She shares that  initially he recognizes the need to have a bowel movement, but, it over the last few months she feels that he no longer recognizes the need and is incontinent of stool.  She shares that she lifted him when he fell in August and strained her scapula.  She shares that she is "weary" and has not been able to rest her shoulder.  She shares that she is somewhat having difficulty in providing care.   We then focused on their current illness.  We talk in detail about his UTI, Rocephin IV, awaiting cultures.  We also talk about pleural effusion that was present in August.  [name] is agreeable to thoracentesis.   We talk about speech therapy consult, swallowing issues.  [name] readily states that her husband has had issues since his stroke with chemotherapy in 2016.  She tells me that she is aware that this can improve and decline as his health does.  We talk about speech therapy consult.  We talk about PT consult when appropriate.  We talk  about short-term rehab if qualified.  The natural disease trajectory and expectations at EOL were discussed.  Advanced directives, concepts specific to code status, artifical feeding and hydration, and rehospitalization were considered and discussed.  We discussed MOST form.  [name] shares that her husband expressed a desire for full code, but she herself is a DNR.    Discussed the importance of continued conversation with family and the medical providers regarding overall plan of care and treatment options, ensuring decisions are within the context of the patient's values and GOCs. Questions and concerns were addressed.  The patient and family was encouraged to call with questions or concerns.  PMT will continue to support holistically.  Conference with attending, bedside nursing staff, transition of care team related to patient condition, needs, goals of care, disposition.    HCPOA  NEXT OF KIN -Mrs. Hutmacher states that she and their son, Jeremiah Owens, are healthcare surrogates.    SUMMARY OF RECOMMENDATIONS   At this point full scope/full code Agreeable to thoracentesis Time for outcomes PT eval for possible STR if qualified   Code Status/Advance Care Planning: Full code  MOST form completed July 09, 2021 for full scope/full code.   Symptom Management:  Hospitalist/CCM, no additional needs at this time.  Palliative Prophylaxis:  Frequent Pain Assessment and Oral Care  Additional Recommendations (Limitations, Scope, Preferences): Full Scope Treatment  Psycho-social/Spiritual:  Desire for further Chaplaincy support:no Additional Recommendations: Caregiving  Support/Resources  Prognosis:  Unable to determine, based on outcomes.  Discharge Planning: To be determined, based on outcomes/qualifications.      Primary Diagnoses: Present on Admission:  UTI (urinary tract infection)  Multiple myeloma not having achieved remission ([place])  Stage 3a chronic Owens disease (CKD)  ([place])   I have reviewed the medical record, interviewed the patient and family, and examined the patient. The following aspects are pertinent.  Past Medical History:  Diagnosis Date   Allergy    Anxiety    Bone cancer ([place])    t_spine T-5   Compression fracture    Encounter for antineoplastic chemotherapy 01/31/2015   Hearing loss    Melanoma ([place])    Multiple myeloma ([place])    Prostate cancer ([place]) 2002   S/P radiation therapy 08/21/14-09/04/14   T4-6 25Gy/16f   S/P radiation therapy 11/29/14-12/12/14   rt prox humerus/shoulder/scapula 25Gy/179f  Skin cancer 1994   melanoma  right neck    [place]  Thyroid disease    Social History   Socioeconomic History   Marital status: Married    Spouse name: Not on file   Number of children: Not on file   Years of education: Not on file   Highest education level: Not on file  Occupational History   Not on file  Tobacco Use   Smoking status: Never   Smokeless tobacco: Never  Vaping Use   Vaping Use: Never used  Substance and Sexual Activity   Alcohol use: No    Alcohol/week: 0.0 standard drinks of alcohol   Drug use: No   Sexual activity: Not on file  Other Topics Concern   Not on file  Social History Narrative   Not on file   Social Determinants of Health   Financial Resource Strain: Not on file  Food Insecurity: Not on file  Transportation Needs: Not on file  Physical Activity: Not on file  Stress: Not on file  Social Connections: Not on file   Family History  Problem Relation Age of Onset   Cancer Mother        breast   Stroke Mother    Scheduled Meds:  aspirin EC  81 mg Oral Daily   heparin  5,000 Units Subcutaneous Q8H   levothyroxine  125 mcg Oral Q0600   pantoprazole  40 mg Oral Daily   tamsulosin  0.4 mg Oral QHS   Continuous Infusions:  cefTRIAXone (ROCEPHIN)  IV     PRN Meds:.acetaminophen **OR** acetaminophen, diclofenac Sodium, ondansetron **OR** ondansetron (ZOFRAN) IV, mouth rinse,  senna-docusate Medications Prior to Admission:  Prior to Admission medications   Medication Sig Start Date End Date Taking? Authorizing Provider  aspirin 81 MG EC tablet Take 81 mg by mouth daily. 03/29/15  Yes [provider]  Calcium Carbonate-Vitamin D (CALTRATE 600+D PO) Take 600 mg by mouth 2 (two) times daily.   Yes [provider]  cholecalciferol (VITAMIN D) 1000 UNITS tablet Take 1,000 Units by mouth at bedtime.    Yes [provider]  diclofenac Sodium (VOLTAREN) 1 % GEL Apply 4 g topically 4 (four) times daily. Patient taking differently: Apply 2 g topically daily as needed (for body pain). 03/28/22  Yes Deno Etienne, DO  diphenoxylate-atropine (LOMOTIL) 2.5-0.025 MG tablet Take 2 tablets by mouth 4 (four) times daily as needed for diarrhea or loose stools. 04/01/16  Yes Curt Bears, MD  furosemide (LASIX) 20 MG tablet Take 20-40 mg by mouth See admin instructions. Take 2 tablets by mouth every Monday and Thursday, then take 1 tablet all other days   Yes [provider]  hydrocortisone 2.5 % lotion Apply 1 application topically at bedtime as needed. 03/24/20  Yes [provider]  KLOR-CON 10 10 MEQ tablet Take 10 mEq by mouth daily. 03/21/15  Yes [provider]  levothyroxine (SYNTHROID) 125 MCG tablet 1 tablet in the morning on an empty stomach 08/01/20  Yes [provider]  Multiple Vitamins-Minerals (CENTRUM SILVER PO) Take 1 tablet by mouth daily.   Yes [provider]  omeprazole (PRILOSEC) 40 MG capsule Take 40 mg by mouth daily as needed (heartburn).    Yes [provider]  saccharomyces boulardii (FLORASTOR) 250 MG capsule Take 250 mg by mouth daily.   Yes [provider]  tamsulosin (FLOMAX) 0.4 MG CAPS capsule Take 1 capsule (0.4 mg total) by mouth at bedtime. 02/21/15  Yes Angiulli, Lavon Paganini, PA-C  TEXACORT 2.5 % SOLN Apply 1 Application topically daily as  needed (for scalp). 07/02/17  Yes  [provider]  methocarbamol (ROBAXIN) 500 MG tablet TAKE 1 TABLET EVERY 6 HOURS AS NEEDED FOR MUSCLE SPASMS Patient not taking: Reported on 07/04/2022 03/31/18   Meredith Staggers, MD   Allergies  Allergen Reactions   Iodine Itching and Rash    Occurred >20 years ago to topical preporation.  Tolerated IV contrast without issue.    Review of Systems  Unable to perform ROS: Age    Physical Exam Vitals and nursing note reviewed.  Constitutional:      General: He is not in acute distress.    Appearance: He is ill-appearing.  Cardiovascular:     Rate and Rhythm: Normal rate.  Pulmonary:     Effort: Pulmonary effort is normal. No respiratory distress.  Skin:    General: Skin is warm and dry.  Neurological:     Mental Status: He is alert.     Comments: Oriented to person and place, thinks it is November  Psychiatric:        Mood and Affect: Mood normal.        Behavior: Behavior normal.     Comments: Calm and cooperative, not fearful     Vital Signs: BP (!) 111/50 (BP Location: Right Arm)   Pulse 89   Temp 98.2 F (36.8 C) (Oral)   Resp 17   SpO2 96%  Pain Scale: 0-10   Pain Score: 0-No pain   SpO2: SpO2: 96 % O2 Device:SpO2: 96 % O2 Flow Rate: .   IO: Intake/output summary:  Intake/Output Summary (Last 24 hours) at 07/05/2022 1329 Last data filed at 07/04/2022 2328 Gross per 24 hour  Intake 101.03 ml  Output --  Net 101.03 ml    LBM:   Baseline Weight:   Most recent weight:       Palliative Assessment/Data:   Flowsheet Rows    Flowsheet Row Most Recent Value  Intake Tab   Referral Department Hospitalist  Unit at Time of Referral Med/Surg Unit  Palliative Care Primary Diagnosis Other (Comment)  [UTI]  Date Notified 07/05/22  Palliative Care Type New Palliative care  Reason for referral Clarify Goals of Care  Date of Admission 07/04/22  Date first seen by Palliative Care 07/05/22  # of days Palliative referral response time 0 Day(s)  # of  days IP prior to Palliative referral 1  Clinical Assessment   Palliative Performance Scale Score 30%  Pain Max last 24 hours Not able to report  Pain Min Last 24 hours Not able to report  Dyspnea Max Last 24 Hours Not able to report  Dyspnea Min Last 24 hours Not able to report  Psychosocial & Spiritual Assessment   Palliative Care Outcomes        Time In: 3709 Time Out: 1400 Time Total: 75 minutes  Greater than 50%  of this time was spent counseling and coordinating care related to the above assessment and plan.  Signed by: Drue Novel, NP   Please contact Palliative Medicine Team phone at [phone] for questions and concerns.  For individual provider: See Shea Evans

## 2022-07-05 NOTE — Evaluation (Signed)
Physical Therapy Evaluation Patient Details Name: Jeremiah Owens MRN: [id number] DOB: [birth year] Today's Date: 07/05/2022  History of Present Illness  88 y.o. male presents to [facility] hospital on 07/04/2022 with several days of poor appetite, fatigue, dysuria. UA suggestive of UTI, chest x-ray with R pleural effusion. PMH includes anxiety, multiple myeloma with bony mets, compression fx, prostate CA.  Clinical Impression  Pt presents to PT with deficits in functional mobility, gait, balance, strength, power, endurance. Pt demonstrates slowed processing at times during session, with poor awareness of deficits. Pt currently requires significant assistance to mobilize in bed and transfer. Once standing pt can ambulate with RW for limited household distances, but does appear to fatigue quickly. Pt will benefit from aggressive mobilization in an effort to reduce falls risk and caregiver burden. PT recommends SNF placement at this time due to lack of caregiver support as the pt would currently need physical assistance for all functional mobility.     Recommendations for follow up therapy are one component of a multi-disciplinary discharge planning process, led by the attending physician.  Recommendations may be updated based on patient status, additional functional criteria and insurance authorization.  Follow Up Recommendations Skilled nursing-short term rehab (<3 hours/day) (HHPT if pt/family elect to discharge home) Can patient physically be transported by private vehicle: Yes    Assistance Recommended at Discharge Intermittent Supervision/Assistance  Patient can return home with the following  A lot of help with walking and/or transfers;A lot of help with bathing/dressing/bathroom;Assistance with cooking/housework;Assist for transportation;Help with stairs or ramp for entrance;Direct supervision/assist for medications management;Direct supervision/assist for financial management    Equipment  Recommendations Wheelchair (measurements PT);BSC/3in1  Recommendations for Other Services       Functional Status Assessment Patient has had a recent decline in their functional status and demonstrates the ability to make significant improvements in function in a reasonable and predictable amount of time.     Precautions / Restrictions Precautions Precautions: Fall Restrictions Weight Bearing Restrictions: No      Mobility  Bed Mobility Overal bed mobility: Needs Assistance Bed Mobility: Supine to Sit, Sit to Supine     Supine to sit: Mod assist, HOB elevated Sit to supine: Mod assist, HOB elevated        Transfers Overall transfer level: Needs assistance Equipment used: Rolling walker (2 wheels) Transfers: Sit to/from Stand Sit to Stand: Mod assist, From elevated surface           General transfer comment: PT assist to facilitate increased forward lean to negate pt's posterior preference    Ambulation/Gait Ambulation/Gait assistance: Min assist Gait Distance (Feet): 15 Feet Assistive device: Rolling walker (2 wheels) Gait Pattern/deviations: Step-to pattern Gait velocity: reduced Gait velocity interpretation: <1.31 ft/sec, indicative of household ambulator   General Gait Details: slowed step-to gait, increased trunk flexion over RW  Stairs            Wheelchair Mobility    Modified Rankin (Stroke Patients Only)       Balance Overall balance assessment: Needs assistance Sitting-balance support: No upper extremity supported, Feet supported Sitting balance-Leahy Scale: Fair     Standing balance support: Bilateral upper extremity supported, Reliant on assistive device for balance Standing balance-Leahy Scale: Poor                               Pertinent Vitals/Pain Pain Assessment Pain Assessment: No/denies pain    Home Living Family/patient expects to be discharged to::  Private residence Living Arrangements: Spouse/significant  other Available Help at Discharge: Family;Available 24 hours/day;Personal care attendant (PCA 3 days a week, assist the pt with bathing) Type of Home: House Home Access: Stairs to enter Entrance Stairs-Rails: None Entrance Stairs-Number of Steps: 1   Home Layout: One level Home Equipment: Rollator (4 wheels) Additional Comments: history will need to be confirmed with family/caregivers    Prior Function Prior Level of Function : Needs assist             Mobility Comments: pt reports being ambulatory with 4 wheeled walker ADLs Comments: requires assistance for bathing and IADLs     Hand Dominance   Dominant Hand: Right    Extremity/Trunk Assessment   Upper Extremity Assessment Upper Extremity Assessment: Generalized weakness    Lower Extremity Assessment Lower Extremity Assessment: Generalized weakness    Cervical / Trunk Assessment Cervical / Trunk Assessment: Kyphotic  Communication   Communication: HOH  Cognition Arousal/Alertness: Awake/alert Behavior During Therapy: Flat affect Overall Cognitive Status: Impaired/Different from baseline Area of Impairment: Orientation, Safety/judgement, Awareness                 Orientation Level: Disoriented to, Situation       Safety/Judgement: Decreased awareness of deficits Awareness: Intellectual            General Comments General comments (skin integrity, edema, etc.): pt not being monitored. PT notes increased work of breathing with mobility, pt denies fatigue/SOB    Exercises     Assessment/Plan    PT Assessment Patient needs continued PT services  PT Problem List Decreased strength;Decreased activity tolerance;Decreased balance;Decreased mobility;Decreased cognition;Decreased knowledge of use of DME;Decreased safety awareness;Cardiopulmonary status limiting activity       PT Treatment Interventions DME instruction;Gait training;Stair training;Functional mobility training;Therapeutic  activities;Therapeutic exercise;Balance training;Neuromuscular re-education;Patient/family education    PT Goals (Current goals can be found in the Care Plan section)  Acute Rehab PT Goals Patient Stated Goal: to improve mobility quality and endurance PT Goal Formulation: With patient Time For Goal Achievement: 07/19/22 Potential to Achieve Goals: Fair    Frequency Min 3X/week (will attempt to progress mobility in an effort to D/C home)     Co-evaluation               AM-PAC PT "6 Clicks" Mobility  Outcome Measure Help needed turning from your back to your side while in a flat bed without using bedrails?: A Little Help needed moving from lying on your back to sitting on the side of a flat bed without using bedrails?: A Lot Help needed moving to and from a bed to a chair (including a wheelchair)?: A Lot Help needed standing up from a chair using your arms (e.g., wheelchair or bedside chair)?: A Lot Help needed to walk in hospital room?: A Little Help needed climbing 3-5 steps with a railing? : Total 6 Click Score: 13    End of Session   Activity Tolerance: Patient tolerated treatment well Patient left: in bed;with call bell/phone within reach;with bed alarm set Nurse Communication: Mobility status PT Visit Diagnosis: Other abnormalities of gait and mobility (R26.89);Muscle weakness (generalized) (M62.81)    Time: 3159-4585 PT Time Calculation (min) (ACUTE ONLY): 22 min   Charges:   PT Evaluation $PT Eval Low Complexity: [place], PT, DPT Acute Rehabilitation Office [phone]   Zenaida Niece 07/05/2022, 4:59 PM

## 2022-07-05 NOTE — Plan of Care (Signed)

## 2022-07-05 NOTE — Progress Notes (Signed)
PROGRESS NOTE    Jeremiah Owens  RRN:[id number] DOB: [birth year] DOA: 07/04/2022 PCP: Wenda Low, MD   Brief Narrative:  88 y.o. male with medical history significant for multiple myeloma, hypothyroidism, CKD 3A, aortic stenosis, and history of CVA presented with dysuria, fatigue and loss of appetite.  On presentation, creatinine was 1.30; chest x-ray showed moderate right-sided pleural effusion.  UA was suspicious of UTI.  He was started on IV antibiotics.  Assessment & Plan:   UTI/acute cystitis--present on admission -Continue Rocephin.  Follow cultures.  Moderate right-sided pleural effusion -Currently on room air.  Not tachypneic.  History of multiple myeloma: Unclear if this is malignancy related pleural effusion.  I have called PCCM for consultation: Follow recommendations.  History of multiple myeloma -Status post chemotherapy and radiation - currently treated with Zometa infusions under the care of Dr. Julien Nordmann  - Continue oncology follow-up on discharge   History of unspecified CVA  - Continue ASA    CKD IIIa  -Creatinine currently at baseline.  Lasix on hold.  Monitor.  Anemia of chronic disease -Possibly from chronic kidney disease.  Hemoglobin stable.  BPH -Continue Flomax   Hypothyroidism  - Continue Synthroid   Physical deconditioning -PT eval  Goals of care -Listed as full code.  Tried addressing goals of care with the patient but he did not seem to engage.  Consult palliative care for the same.   DVT prophylaxis: Subcutaneous heparin Code Status: Full Family Communication: None at bedside Disposition Plan: Status is: Observation The patient will require care spanning > 2 midnights and should be moved to inpatient because: Of severity of illness.  Need for IV antibiotics.  PT eval  Consultants: Consult palliative care.  Pulmonary  Procedures: None  Antimicrobials: Rocephin from 07/04/2022 onwards   Subjective: Patient seen and examined at  bedside.  Hard of hearing.  Poor historian.  Denies any worsening shortness of breath.  Still feels weak with dysuria.  Objective: Vitals:   07/04/22 2242 07/05/22 0110 07/05/22 0648 07/05/22 0820  BP: 137/78 131/82 (!) 147/56 115/67  Pulse: 92 91 76 79  Resp: _0 Temp: 98.4 F (36.9 C) 97.8 F (36.6 C) 98.2 F (36.8 C) (!) 97.4 F (36.3 C)  TempSrc: Oral Oral  Oral  SpO2: 98% 96% 99% 98%    Intake/Output Summary (Last 24 hours) at 07/05/2022 1134 Last data filed at 07/04/2022 2328 Gross per 24 hour  Intake 101.03 ml  Output --  Net 101.03 ml   There were no vitals filed for this visit.  Examination:  General exam: Appears calm and comfortable.  Hard of hearing.  Poor historian.  Elderly male lying in bed.  Currently on room air. Respiratory system: Bilateral decreased breath sounds at bases with some scattered crackles Cardiovascular system: S1 & S2 heard, Rate controlled Gastrointestinal system: Abdomen is nondistended, soft and nontender. Normal bowel sounds heard. Extremities: No cyanosis, clubbing, edema  Central nervous system: Alert and oriented.  Slow to respond.  Poor historian.  No focal neurological deficits. Moving extremities Skin: No rashes, lesions or ulcers Psychiatry: Extremely flat affect.  No signs of agitation.    Data Reviewed: I have personally reviewed following labs and imaging studies  CBC: Recent Labs  Lab 07/04/22 1448 07/05/22 0349  WBC 7.6 7.7  NEUTROABS 6.0  --   HGB 11.2* 11.0*  HCT 35.5* 33.5*  MCV 88.3 84.6  PLT 256 338   Basic Metabolic Panel: Recent Labs  Lab 07/04/22 1448 07/05/22  0349  NA 143 141  K 4.2 3.7  CL 106 106  CO2 26 25  GLUCOSE 96 105*  BUN 31* 28*  CREATININE 1.30* 1.18  CALCIUM 9.6 9.1   GFR: CrCl cannot be calculated (Unknown ideal weight.). Liver Function Tests: Recent Labs  Lab 07/04/22 1448  AST 33  ALT 30  ALKPHOS 82  BILITOT 0.5  PROT 6.7  ALBUMIN 3.1*   No results for  input(s): "LIPASE", "AMYLASE" in the last 168 hours. No results for input(s): "AMMONIA" in the last 168 hours. Coagulation Profile: No results for input(s): "INR", "PROTIME" in the last 168 hours. Cardiac Enzymes: No results for input(s): "CKTOTAL", "CKMB", "CKMBINDEX", "TROPONINI" in the last 168 hours. BNP (last 3 results) No results for input(s): "PROBNP" in the last 8760 hours. HbA1C: No results for input(s): "HGBA1C" in the last 72 hours. CBG: No results for input(s): "GLUCAP" in the last 168 hours. Lipid Profile: No results for input(s): "CHOL", "HDL", "LDLCALC", "TRIG", "CHOLHDL", "LDLDIRECT" in the last 72 hours. Thyroid Function Tests: No results for input(s): "TSH", "T4TOTAL", "FREET4", "T3FREE", "THYROIDAB" in the last 72 hours. Anemia Panel: No results for input(s): "VITAMINB12", "FOLATE", "FERRITIN", "TIBC", "IRON", "RETICCTPCT" in the last 72 hours. Sepsis Labs: Recent Labs  Lab 07/04/22 2329  LATICACIDVEN 1.2    No results found for this or any previous visit (from the past 240 hour(s)).       Radiology Studies: DG Chest 2 View  Result Date: 07/04/2022 CLINICAL DATA:  Weakness. EXAM: CHEST - 2 VIEW COMPARISON:  March 27, 2022. FINDINGS: The heart size and mediastinal contours are within normal limits. Moderate size right pleural effusion is noted with associated atelectasis. Left lung is clear. Status post surgical posterior fusion of midthoracic spine is noted with old midthoracic fracture. IMPRESSION: Moderate size right pleural effusion is noted with associated atelectasis. Electronically Signed   By: Marijo Conception M.D.   On: 07/04/2022 16:02        Scheduled Meds:  aspirin EC  81 mg Oral Daily   heparin  5,000 Units Subcutaneous Q8H   levothyroxine  125 mcg Oral Q0600   pantoprazole  40 mg Oral Daily   tamsulosin  0.4 mg Oral QHS   Continuous Infusions:  cefTRIAXone (ROCEPHIN)  IV            Aline August, MD [facility] 07/05/2022, 11:34 AM

## 2022-07-05 NOTE — Consult Note (Signed)
NAME:  Jeremiah Owens, MRN:  [id number], DOB:  February 10, 1930, LOS: 0 ADMISSION DATE:  07/04/2022, CONSULTATION DATE: 07/05/2022 REFERRING MD: Dr. Starla Link, CHIEF COMPLAINT: Pleural effusion  History of Present Illness:  88 year old gentleman, never smoker, with a history of multiple myeloma followed by Dr. Earlie Server and on Zometa and post targeted XRT to bony lesions in the past.  Also with history of prostate cancer, hypothyroidism, aortic stenosis, CVA, CKD stage IIIa. He was admitted on 07/04/2022 with several days of poor appetite, fatigue and dysuria.  UA suggestive of a possible UTI and he was started on ceftriaxone.  Urine culture is sent and pending. Part of his evaluation included chest x-ray 12/1 that shows normal heart shadow, moderate right pleural effusion with some associated right basilar atelectasis.  The left side appears normal.  He had a CT scan of his chest abdomen and pelvis done in 03/2022 after a fall.  There was a moderate layering right pleural effusion on that study as well.  PCCM consulted regarding the right pleural effusion  Pertinent  Medical History   Past Medical History:  Diagnosis Date   Allergy    Anxiety    Bone cancer ([place])    t_spine T-5   Compression fracture    Encounter for antineoplastic chemotherapy 01/31/2015   Hearing loss    Melanoma ([place])    Multiple myeloma ([place])    Prostate cancer ([place]) 2002   S/P radiation therapy 08/21/14-09/04/14   T4-6 25Gy/43f   S/P radiation therapy 11/29/14-12/12/14   rt prox humerus/shoulder/scapula 25Gy/180f  Skin cancer 1994   melanoma  right neck    [facility]   Thyroid disease     Significant Hospital Events: Including procedures, antibiotic start and stop dates in addition to other pertinent events   Chest x-ray 12/1 > normal mediastinum, moderate right pleural effusion with associated atelectasis, no other abnormalities CT scan chest/abdomen/pelvis 03/28/2022 > no cardiomegaly, no pericardial effusion, no  mediastinal or hilar adenopathy, small to moderate right pleural effusion with associated basilar atelectasis.  T5 compression fracture, chronic rib fractures, chronic unhealed medial left clavicular fracture,  Interim History / Subjective:    Objective   Blood pressure (!) 111/50, pulse 89, temperature 98.2 F (36.8 C), temperature source Oral, resp. rate 17, SpO2 96 %.        Intake/Output Summary (Last 24 hours) at 07/05/2022 1313 Last data filed at 07/04/2022 2328 Gross per 24 hour  Intake 101.03 ml  Output --  Net 101.03 ml   There were no vitals filed for this visit.  Examination: General: pleasant elderly man laying in bed, NAD HENT: op clear, strong voice, no stridor, pupils equal Lungs: decreased R base, L clear Cardiovascular: regular, no M Abdomen: non distended, + BS Extremities: no edema Neuro: awake, alert. A bit slow to answer questions but oriented and appropriate  Resolved Hospital Problem list     Assessment & Plan:   R pleural effusion, subacute (noted initially 03/2022), unclear cause. DDx is broad but consider transudative effusion given hx AS. Consider malignant effusion given his hx MM (in remission on Zometa currently). Less likely a hemothorax from his fall in 03/2022, infection, etc.  -discussed with him the rationale for, risks/benefits of R thoracentesis which would be the best way to better evaluate. He is open to the procedure, wants to discuss it with his wife. We may be able to perform over the weekend. Alternatively could consider asking IR to perform - may be safer as  unclear whether he will be able to sit up at bedside and their imaging may be better. I will check in with him 12/3 to see if he wants to proceed.     Labs   CBC: Recent Labs  Lab 07/04/22 1448 07/05/22 0349  WBC 7.6 7.7  NEUTROABS 6.0  --   HGB 11.2* 11.0*  HCT 35.5* 33.5*  MCV 88.3 84.6  PLT 256 638    Basic Metabolic Panel: Recent Labs  Lab 07/04/22 1448  07/05/22 0349  NA 143 141  K 4.2 3.7  CL 106 106  CO2 26 25  GLUCOSE 96 105*  BUN 31* 28*  CREATININE 1.30* 1.18  CALCIUM 9.6 9.1   GFR: CrCl cannot be calculated (Unknown ideal weight.). Recent Labs  Lab 07/04/22 1448 07/04/22 2329 07/05/22 0349  WBC 7.6  --  7.7  LATICACIDVEN  --  1.2  --     Liver Function Tests: Recent Labs  Lab 07/04/22 1448  AST 33  ALT 30  ALKPHOS 82  BILITOT 0.5  PROT 6.7  ALBUMIN 3.1*   No results for input(s): "LIPASE", "AMYLASE" in the last 168 hours. No results for input(s): "AMMONIA" in the last 168 hours.  ABG No results found for: "PHART", "PCO2ART", "PO2ART", "HCO3", "TCO2", "ACIDBASEDEF", "O2SAT"   Coagulation Profile: No results for input(s): "INR", "PROTIME" in the last 168 hours.  Cardiac Enzymes: No results for input(s): "CKTOTAL", "CKMB", "CKMBINDEX", "TROPONINI" in the last 168 hours.  HbA1C: Hgb A1c MFr Bld  Date/Time Value Ref Range Status  02/11/2015 03:39 PM 5.7 (H) 4.8 - 5.6 % Final    Comment:    (NOTE)         Pre-diabetes: 5.7 - 6.4         Diabetes: >6.4         Glycemic control for adults with diabetes: <7.0     CBG: No results for input(s): "GLUCAP" in the last 168 hours.  Review of Systems:   Denies any sx at this time.   Past Medical History:  He,  has a past medical history of Allergy, Anxiety, Bone cancer ([place]), Compression fracture, Encounter for antineoplastic chemotherapy (01/31/2015), Hearing loss, Melanoma ([place]), Multiple myeloma ([place]), Prostate cancer ([place]) (2002), S/P radiation therapy (08/21/14-09/04/14), S/P radiation therapy (11/29/14-12/12/14), Skin cancer (1994), Stroke [facility]), and Thyroid disease.   Surgical History:   Past Surgical History:  Procedure Laterality Date   HERNIA REPAIR     POSTERIOR CERVICAL FUSION/FORAMINOTOMY N/A 06/17/2014   Procedure: Thoracic five laminectomy for epidural tumor resection Thoracic 4-7 posterior lateral arthrodesis, segmental pedicle screw fixation.;   Surgeon: Ashok Pall, MD;  Location: [place] [facility];  Service: Neurosurgery;  Laterality: N/A;     Social History:   reports that he has never smoked. He has never used smokeless tobacco. He reports that he does not drink alcohol and does not use drugs.   Family History:  His family history includes Cancer in his mother; Stroke in his mother.   Allergies Allergies  Allergen Reactions   Iodine Itching and Rash    Occurred >20 years ago to topical preporation.  Tolerated IV contrast without issue.      Home Medications  Prior to Admission medications   Medication Sig Start Date End Date Taking? Authorizing Provider  aspirin 81 MG EC tablet Take 81 mg by mouth daily. 03/29/15  Yes [provider]  Calcium Carbonate-Vitamin D (CALTRATE 600+D PO) Take 600 mg by mouth 2 (two) times daily.  Yes [provider]  cholecalciferol (VITAMIN D) 1000 UNITS tablet Take 1,000 Units by mouth at bedtime.    Yes [provider]  diclofenac Sodium (VOLTAREN) 1 % GEL Apply 4 g topically 4 (four) times daily. Patient taking differently: Apply 2 g topically daily as needed (for body pain). 03/28/22  Yes Deno Etienne, DO  diphenoxylate-atropine (LOMOTIL) 2.5-0.025 MG tablet Take 2 tablets by mouth 4 (four) times daily as needed for diarrhea or loose stools. 04/01/16  Yes Curt Bears, MD  furosemide (LASIX) 20 MG tablet Take 20-40 mg by mouth See admin instructions. Take 2 tablets by mouth every Monday and Thursday, then take 1 tablet all other days   Yes [provider]  hydrocortisone 2.5 % lotion Apply 1 application topically at bedtime as needed. 03/24/20  Yes [provider]  KLOR-CON 10 10 MEQ tablet Take 10 mEq by mouth daily. 03/21/15  Yes [provider]  levothyroxine (SYNTHROID) 125 MCG tablet 1 tablet in the morning on an empty stomach 08/01/20  Yes [provider]  Multiple Vitamins-Minerals (CENTRUM SILVER PO) Take 1 tablet by mouth  daily.   Yes [provider]  omeprazole (PRILOSEC) 40 MG capsule Take 40 mg by mouth daily as needed (heartburn).    Yes [provider]  saccharomyces boulardii (FLORASTOR) 250 MG capsule Take 250 mg by mouth daily.   Yes [provider]  tamsulosin (FLOMAX) 0.4 MG CAPS capsule Take 1 capsule (0.4 mg total) by mouth at bedtime. 02/21/15  Yes Angiulli, Lavon Paganini, PA-C  TEXACORT 2.5 % SOLN Apply 1 Application topically daily as needed (for scalp). 07/02/17  Yes [provider]  methocarbamol (ROBAXIN) 500 MG tablet TAKE 1 TABLET EVERY 6 HOURS AS NEEDED FOR MUSCLE SPASMS Patient not taking: Reported on 07/04/2022 03/31/18   Meredith Staggers, MD     Critical care time: NA      Baltazar Apo, MD, PhD 07/05/2022, 1:13 PM [facility] [phone] or if no answer before 7:00PM call [phone] For any issues after 7:00PM please call eLink [phone]

## 2022-07-05 NOTE — H&P (Signed)
History and Physical    Jeremiah Owens EKC:[id number] DOB: 1930/03/12 DOA: 07/04/2022  PCP: Wenda Low, MD   Patient coming from: Home   Chief Complaint: Pain with urination, fatigue, loss of appetite   HPI: Jeremiah Owens is a pleasant 88 y.o. male with medical history significant for multiple myeloma, hypothyroidism, CKD 3A, aortic stenosis, and history of CVA who presents to the emergency department with dysuria, loss of appetite, and fatigue.  Patient reports dysuria for the past 2 to 3 days.  He has had loss of appetite, fatigue, and general weakness associated with this.  He has been sleeping more and unable to ambulate with his walker as he usually does.  There is no new focal numbness or weakness.  He denies fever, chills, or chest pain.  ED Course: Upon arrival to the ED, patient is found to be afebrile and saturating well on room air with stable blood pressure.  EKG features sinus rhythm with PVCs.  Chest x-ray with moderate-sized right pleural effusion.  Blood work notable for creatinine 1.30, hemoglobin 11.2, and troponin 19 x 2.  Urine was sent for culture and the patient was given a dose of IV Rocephin in the ED.  Review of Systems:  All other systems reviewed and apart from HPI, are negative.  Past Medical History:  Diagnosis Date   Allergy    Anxiety    Bone cancer ([place])    t_spine T-5   Compression fracture    Encounter for antineoplastic chemotherapy 01/31/2015   Hearing loss    Melanoma ([place])    Multiple myeloma ([place])    Prostate cancer ([place]) 2002   S/P radiation therapy 08/21/14-09/04/14   T4-6 25Gy/56f   S/P radiation therapy 11/29/14-12/12/14   rt prox humerus/shoulder/scapula 25Gy/173f  Skin cancer 1994   melanoma  right neck    [facility]   Thyroid [facility]     Past Surgical History:  Procedure Laterality Date   HERNIA REPAIR     POSTERIOR CERVICAL FUSION/FORAMINOTOMY N/A 06/17/2014   Procedure: Thoracic five laminectomy for epidural tumor  resection Thoracic 4-7 posterior lateral arthrodesis, segmental pedicle screw fixation.;  Surgeon: KyAshok PallMD;  [facility];  Service: Neurosurgery;  Laterality: N/A;    Social History:   reports that he has never smoked. He has never used smokeless tobacco. He reports that he does not drink alcohol and does not use drugs.  Allergies  Allergen Reactions   Iodine Itching and Rash    Occurred >20 years ago to topical preporation.  Tolerated IV contrast without issue.     Family History  Problem Relation Age of Onset   Cancer Mother        breast   Stroke Mother      Prior to Admission medications   Medication Sig Start Date End Date Taking? Authorizing Provider  aspirin 81 MG EC tablet Take 81 mg by mouth daily. 03/29/15  Yes [provider]  Calcium Carbonate-Vitamin D (CALTRATE 600+D PO) Take 600 mg by mouth 2 (two) times daily.   Yes [provider]  cholecalciferol (VITAMIN D) 1000 UNITS tablet Take 1,000 Units by mouth at bedtime.    Yes [provider]  diclofenac Sodium (VOLTAREN) 1 % GEL Apply 4 g topically 4 (four) times daily. Patient taking differently: Apply 2 g topically daily as needed (for body pain). 03/28/22  Yes FlDeno EtienneDO  diphenoxylate-atropine (LOMOTIL) 2.5-0.025 MG tablet Take 2 tablets by mouth 4 (four) times daily  as needed for diarrhea or loose stools. 04/01/16  Yes Curt Bears, MD  furosemide (LASIX) 20 MG tablet Take 20-40 mg by mouth See admin instructions. Take 2 tablets by mouth every Monday and Thursday, then take 1 tablet all other days   Yes [provider]  hydrocortisone 2.5 % lotion Apply 1 application topically at bedtime as needed. 03/24/20  Yes [provider]  KLOR-CON 10 10 MEQ tablet Take 10 mEq by mouth daily. 03/21/15  Yes [provider]  levothyroxine (SYNTHROID) 125 MCG tablet 1 tablet in the morning on an empty stomach 08/01/20  Yes [provider]   Multiple Vitamins-Minerals (CENTRUM SILVER PO) Take 1 tablet by mouth daily.   Yes [provider]  omeprazole (PRILOSEC) 40 MG capsule Take 40 mg by mouth daily as needed (heartburn).    Yes [provider]  saccharomyces boulardii (FLORASTOR) 250 MG capsule Take 250 mg by mouth daily.   Yes [provider]  tamsulosin (FLOMAX) 0.4 MG CAPS capsule Take 1 capsule (0.4 mg total) by mouth at bedtime. 02/21/15  Yes Angiulli, Lavon Paganini, PA-C  TEXACORT 2.5 % SOLN Apply 1 Application topically daily as needed (for scalp). 07/02/17  Yes [provider]  methocarbamol (ROBAXIN) 500 MG tablet TAKE 1 TABLET EVERY 6 HOURS AS NEEDED FOR MUSCLE SPASMS Patient not taking: Reported on 07/04/2022 03/31/18   Meredith Staggers, MD    Physical Exam: Vitals:   07/04/22 2130 07/04/22 2200 07/04/22 2215 07/04/22 2242  BP: 124/84 120/65 127/69 137/78  Pulse: 90 92 92 92  Resp: 16   16  Temp:    98.4 F (36.9 C)  TempSrc:    Oral  SpO2: 98% 97% 99% 98%    Constitutional: NAD, calm  Eyes: PERTLA, lids and conjunctivae normal ENMT: Mucous membranes are moist. Posterior pharynx clear of any exudate or lesions.   Neck: supple, no masses  Respiratory: no wheezing, no crackles. No accessory muscle use.  Cardiovascular: S1 & S2 heard, regular rate and rhythm. No extremity edema.   Abdomen: No distension, no tenderness, soft. Bowel sounds active.  Musculoskeletal: no clubbing / cyanosis. No joint deformity upper and lower extremities.   Skin: no significant rashes, lesions, ulcers. Warm, dry, well-perfused. Neurologic: No gross facial asymmetry. Moving all extremities. Alert and oriented.  Psychiatric: Pleasant. Cooperative.    Labs and Imaging on Admission: I have personally reviewed following labs and imaging studies  CBC: Recent Labs  Lab 07/04/22 1448  WBC 7.6  NEUTROABS 6.0  HGB 11.2*  HCT 35.5*  MCV 88.3  PLT 553   Basic Metabolic Panel: Recent Labs  Lab  07/04/22 1448  NA 143  K 4.2  CL 106  CO2 26  GLUCOSE 96  BUN 31*  CREATININE 1.30*  CALCIUM 9.6   GFR: CrCl cannot be calculated (Unknown ideal weight.). Liver Function Tests: Recent Labs  Lab 07/04/22 1448  AST 33  ALT 30  ALKPHOS 82  BILITOT 0.5  PROT 6.7  ALBUMIN 3.1*   No results for input(s): "LIPASE", "AMYLASE" in the last 168 hours. No results for input(s): "AMMONIA" in the last 168 hours. Coagulation Profile: No results for input(s): "INR", "PROTIME" in the last 168 hours. Cardiac Enzymes: No results for input(s): "CKTOTAL", "CKMB", "CKMBINDEX", "TROPONINI" in the last 168 hours. BNP (last 3 results) No results for input(s): "PROBNP" in the last 8760 hours. HbA1C: No results for input(s): "HGBA1C" in the last 72 hours. CBG: No results for input(s): "GLUCAP" in  the last 168 hours. Lipid Profile: No results for input(s): "CHOL", "HDL", "LDLCALC", "TRIG", "CHOLHDL", "LDLDIRECT" in the last 72 hours. Thyroid Function Tests: No results for input(s): "TSH", "T4TOTAL", "FREET4", "T3FREE", "THYROIDAB" in the last 72 hours. Anemia Panel: No results for input(s): "VITAMINB12", "FOLATE", "FERRITIN", "TIBC", "IRON", "RETICCTPCT" in the last 72 hours. Urine analysis:    Component Value Date/Time   COLORURINE AMBER (A) 07/04/2022 1553   APPEARANCEUR CLOUDY (A) 07/04/2022 1553   LABSPEC 1.021 07/04/2022 1553   PHURINE 8.0 07/04/2022 1553   GLUCOSEU NEGATIVE 07/04/2022 1553   HGBUR NEGATIVE 07/04/2022 1553   BILIRUBINUR NEGATIVE 07/04/2022 1553   KETONESUR NEGATIVE 07/04/2022 1553   PROTEINUR 100 (A) 07/04/2022 1553   UROBILINOGEN 0.2 02/10/2015 2355   NITRITE NEGATIVE 07/04/2022 1553   LEUKOCYTESUR MODERATE (A) 07/04/2022 1553   Sepsis Labs: _0 (procalcitonin:4,lacticidven:4) )No results found for this or any previous visit (from the past 240 hour(s)).   Radiological Exams on Admission: DG Chest 2 View  Result Date: 07/04/2022 CLINICAL DATA:   Weakness. EXAM: CHEST - 2 VIEW COMPARISON:  March 27, 2022. FINDINGS: The heart size and mediastinal contours are within normal limits. Moderate size right pleural effusion is noted with associated atelectasis. Left lung is clear. Status post surgical posterior fusion of midthoracic spine is noted with old midthoracic fracture. IMPRESSION: Moderate size right pleural effusion is noted with associated atelectasis. Electronically Signed   By: Marijo Conception M.D.   On: 07/04/2022 16:02    EKG: Independently reviewed. Sinus rhythm, PVCs.   Assessment/Plan   1. Acute UTI  - Presents with 2-3 days of dysuria with progressive general weakness and loss of appetite - Not septic on admission  - Urine was sent for culture and Rocephin was started in ED  - Continue Rocephin, follow culture and clinical course     2. Multiple myeloma  - Status-post chemotherapy and radiation, currently treated with Zometa infusions under the care of Dr. Julien Nordmann  - Continue oncology follow-up on discharge   3. Hx of CVA  - Continue ASA   4. CKD IIIa  - BUN and SCr up slightly from baseline in setting of recent loss of appetite  - Hold Lasix for now, renally-dose medications, monitor   5. Hypothyroidism  - Continue Synthroid    DVT prophylaxis: sq heparin  Code Status: Full  Level of Care: Level of care: Med-Surg Family Communication: Wife at bedside  Disposition Plan:  Patient is from: home  Anticipated d/c is to: TBD Anticipated d/c date is: 12/2 or 07/06/22  Patient currently: Pending improvement in general weakness, transition to oral antibiotic  Consults called: none  Admission status: Observation     Vianne Bulls, MD [facility]  07/05/2022, 12:21 AM

## 2022-07-06 DIAGNOSIS — Z8673 Personal history of transient ischemic attack (TIA), and cerebral infarction without residual deficits: Secondary | ICD-10-CM | POA: Diagnosis not present

## 2022-07-06 DIAGNOSIS — Z515 Encounter for palliative care: Secondary | ICD-10-CM | POA: Diagnosis not present

## 2022-07-06 DIAGNOSIS — N3000 Acute cystitis without hematuria: Secondary | ICD-10-CM | POA: Diagnosis not present

## 2022-07-06 DIAGNOSIS — Z7189 Other specified counseling: Secondary | ICD-10-CM | POA: Diagnosis not present

## 2022-07-06 DIAGNOSIS — C9000 Multiple myeloma not having achieved remission: Secondary | ICD-10-CM | POA: Diagnosis not present

## 2022-07-06 DIAGNOSIS — N1831 Chronic kidney disease, stage 3a: Secondary | ICD-10-CM | POA: Diagnosis not present

## 2022-07-06 LAB — CBC WITH DIFFERENTIAL/PLATELET
Abs Immature Granulocytes: 0.03 10*3/uL (ref 0.00–0.07)
Basophils Absolute: 0 10*3/uL (ref 0.0–0.1)
Basophils Relative: 0 %
Eosinophils Absolute: 0.2 10*3/uL (ref 0.0–0.5)
Eosinophils Relative: 3 %
HCT: 33 % — ABNORMAL LOW (ref 39.0–52.0)
Hemoglobin: 11.1 g/dL — ABNORMAL LOW (ref 13.0–17.0)
Immature Granulocytes: 1 %
Lymphocytes Relative: 16 %
Lymphs Abs: 1 10*3/uL (ref 0.7–4.0)
MCH: 28.2 pg (ref 26.0–34.0)
MCHC: 33.6 g/dL (ref 30.0–36.0)
MCV: 83.8 fL (ref 80.0–100.0)
Monocytes Absolute: 0.5 10*3/uL (ref 0.1–1.0)
Monocytes Relative: 8 %
Neutro Abs: 4.4 10*3/uL (ref 1.7–7.7)
Neutrophils Relative %: 72 %
Platelets: 237 10*3/uL (ref 150–400)
RBC: 3.94 MIL/uL — ABNORMAL LOW (ref 4.22–5.81)
RDW: 16.1 % — ABNORMAL HIGH (ref 11.5–15.5)
WBC: 6.1 10*3/uL (ref 4.0–10.5)
nRBC: 0 % (ref 0.0–0.2)

## 2022-07-06 LAB — BASIC METABOLIC PANEL
Anion gap: 11 (ref 5–15)
BUN: 26 mg/dL — ABNORMAL HIGH (ref 8–23)
CO2: 23 mmol/L (ref 22–32)
Calcium: 8.6 mg/dL — ABNORMAL LOW (ref 8.9–10.3)
Chloride: 106 mmol/L (ref 98–111)
Creatinine, Ser: 1.08 mg/dL (ref 0.61–1.24)
GFR, Estimated: >60 mL/min (ref >60)
Glucose, Bld: 92 mg/dL (ref 70–99)
Potassium: 3.5 mmol/L (ref 3.5–5.1)
Sodium: 140 mmol/L (ref 135–145)

## 2022-07-06 LAB — URINE CULTURE: Culture: >=100000 — AB

## 2022-07-06 LAB — TSH: TSH: 15.015 u[IU]/mL — ABNORMAL HIGH (ref 0.350–4.500)

## 2022-07-06 LAB — MAGNESIUM: Magnesium: 2 mg/dL (ref 1.7–2.4)

## 2022-07-06 LAB — VITAMIN B12: Vitamin B-12: 568 pg/mL (ref 180–914)

## 2022-07-06 NOTE — NC FL2 (Signed)
[place] LEVEL OF CARE FORM     IDENTIFICATION  Patient Name: Jeremiah Owens Birthdate: 10-Apr-1930 Sex: male Admission Date (Current Location): 07/04/2022  [facility] and [place] Number:  Herbalist and Address:  [facility] [place], [place] [address], [place], [place] [address]      Provider Number: [id number]  Attending Physician Name and Address:  Aline August, MD  Relative Name and Phone Number:       Current Level of Care: Hospital Recommended Level of Care: [place] Prior Approval Number:    Date Approved/Denied:   PASRR Number: [id number]  Discharge Plan: SNF    Current Diagnoses: Patient Active Problem List   Diagnosis Date Noted   UTI (urinary tract infection) 07/04/2022   Stage 3a chronic kidney disease (CKD) ([place]) 07/04/2022   Disorder of left rotator cuff 08/08/2020   Multiple myeloma not having achieved remission ([place]) 10/24/2015   Dysphagia as late effect of stroke 07/10/2015   Diarrhea 04/25/2015   Cancer associated pain 04/25/2015   Long term current use of anticoagulant therapy 04/25/2015   Basal ganglia infarction ([place]) 02/12/2015   Dizziness    Expressive aphasia    Past pointing    Right leg weakness    History of stroke 02/11/2015   Cerebral infarction ([place]) 02/11/2015   Aspiration pneumonia ([place]) 02/11/2015   Aphasia    Difficulty speaking    Speech difficult to understand    Encounter for antineoplastic chemotherapy 01/31/2015   Constipation 12/20/2014   Rash 12/20/2014   Peripheral edema 12/06/2014   Multifactorial gait disorder 09/05/2014   Multiple myeloma ([place])    Paraplegia ([place]) 06/21/2014   Postoperative anemia due to acute blood loss 06/21/2014   Neoplasm of thoracic spine 06/20/2014   Metastasis to spinal column ([place]) 06/17/2014   Thoracic spine tumor 06/17/2014   Metastatic bone cancer 06/15/2014    Orientation RESPIRATION BLADDER Height & Weight     Self, Time,  Situation  Normal Continent Weight:   Height:     BEHAVIORAL SYMPTOMS/MOOD NEUROLOGICAL BOWEL NUTRITION STATUS      Continent Diet (See DC summary)  AMBULATORY STATUS COMMUNICATION OF NEEDS Skin   Extensive Assist Verbally Normal                       Personal Care Assistance Level of Assistance  Bathing, Feeding, Dressing Bathing Assistance: Limited assistance Feeding assistance: Limited assistance Dressing Assistance: Limited assistance     Functional Limitations Info             SPECIAL CARE FACTORS FREQUENCY  PT (By licensed PT), OT (By licensed OT)     PT Frequency: 5x a week OT Frequency: 5x a week            Contractures Contractures Info: Not present    Additional Factors Info  Code Status, Allergies, Insulin Sliding Scale Code Status Info: Full Allergies Info: Iodine   Insulin Sliding Scale Info: See DC summary       Current Medications (07/06/2022):  This is the current hospital active medication list Current Facility-Administered Medications  Medication Dose Route Frequency Provider Last Rate Last Admin   acetaminophen (TYLENOL) tablet 650 mg  650 mg Oral Q6H PRN Opyd, Ilene Qua, MD       Or   acetaminophen (TYLENOL) suppository 650 mg  650 mg Rectal Q6H PRN Opyd, Ilene Qua, MD       aspirin EC tablet 81 mg  81 mg Oral Daily Opyd, Ilene Qua, MD   81 mg at 07/06/22 0851   cefTRIAXone (ROCEPHIN) 1 g in sodium chloride 0.9 % 100 mL IVPB  1 g Intravenous Q24H Opyd, Ilene Qua, MD 200 mL/hr at 07/05/22 2259 1 g at 07/05/22 2259   diclofenac Sodium (VOLTAREN) 1 % topical gel 2 g  2 g Topical Daily PRN Opyd, Ilene Qua, MD       food thickener (SIMPLYTHICK (NECTAR/LEVEL 2/MILDLY THICK)) 10 packet  10 packet Oral PRN Aline August, MD       heparin injection 5,000 Units  5,000 Units Subcutaneous Q8H Opyd, Ilene Qua, MD   5,000 Units at 07/06/22 2767   levothyroxine (SYNTHROID) tablet 125 mcg  125 mcg Oral Q0600 Vianne Bulls, MD   125 mcg at 07/06/22  0512   ondansetron (ZOFRAN) tablet 4 mg  4 mg Oral Q6H PRN Opyd, Ilene Qua, MD       Or   ondansetron (ZOFRAN) injection 4 mg  4 mg Intravenous Q6H PRN Opyd, Ilene Qua, MD       Oral care mouth rinse  15 mL Mouth Rinse PRN Opyd, Ilene Qua, MD       pantoprazole (PROTONIX) EC tablet 40 mg  40 mg Oral Daily Opyd, Ilene Qua, MD   40 mg at 07/06/22 0851   senna-docusate (Senokot-S) tablet 1 tablet  1 tablet Oral QHS PRN Opyd, Ilene Qua, MD       tamsulosin (FLOMAX) capsule 0.4 mg  0.4 mg Oral QHS Opyd, Ilene Qua, MD   0.4 mg at 07/05/22 2301   Facility-Administered Medications Ordered in Other Encounters  Medication Dose Route Frequency Provider Last Rate Last Admin   sodium chloride 0.9 % injection 10 mL  10 mL Intracatheter PRN Curt Bears, MD         Discharge Medications: Please see discharge summary for a list of discharge medications.  Relevant Imaging Results:  Relevant Lab Results:   Additional Information SSN: [id number]  Emeterio Reeve, LCSW

## 2022-07-06 NOTE — Progress Notes (Signed)
Palliative: [name] is resting quietly in bed.  He is resting comfortably, but breathing a little fast.  He wakes easily when I gently touch him and call his name.  He greets me, making and mostly keeping eye contact.  He is alert and oriented x3, able to make his needs known.  There is no family present at bedside at this time.  We talk about his acute illness and the treatment plan including, but not limited to, UTI and antibiotics, fluid on his lung and need for thoracentesis.  At this point he is agreeable to thoracentesis, I shared that this will likely be in interventional radiology tomorrow.  No questions.  We talk about his declining functional status and evaluation for short-term rehab.  At this point he is agreeable to evaluate and consider short-term rehab if qualified.  He is agreeable for me to call his wife, Jeremiah Owens, for an update.  Call to wife, Jeremiah Owens.  We talk about Jeremiah Owens condition and the treatment plan.  We talked about plan for thoracentesis through interventional radiology.  We talked about discharging to short-term rehab.  Jeremiah Owens shares that Jeremiah Owens had been enthusiastic in participating in rehab in 2015 after surgery.  We talk about outpatient palliative services for continued goals of care discussions.  Jeremiah Owens states that she thinks this would be beneficial.  We talk about provider choice.  At this point she is unsure of the provider choice.  She will continue to work with transition of care team.  Conference with attending, bedside nursing staff, transition of care team related to patient condition, needs, goals of care, disposition.  Plan: Anticipate thoracentesis by IR 12/4.  At this point continue full scope/full code.  Would benefit from further [place] discussions with patient after thoracentesis.  Agreeable to short-term rehab if qualified.  6 minutes Jeremiah Axe, NP Palliative medicine team Team phone [phone] Greater than 50% of this time  was spent counseling and coordinating care related to the above assessment and plan.

## 2022-07-06 NOTE — TOC Progression Note (Signed)
Transition of Care [facility]) - Progression Note    Patient Details  Name: Jeremiah Owens MRN: [id number] Date of Birth: February 12, 1930  Transition of Care [facility]) CM/SW Contact  Emeterio Reeve, [place] Phone Number: 07/06/2022, 12:23 PM  Clinical Narrative:     CSW spoke to pts wife. CSW explained pt may qualify for [facility] waiver but it cannot be confirmed until tomorrow. CSW completed FL2 and faxed pt out to facilities in the area.   Expected Discharge Plan: [place]    Expected Discharge Plan and Services Expected Discharge Plan: [place] In-house Referral: Clinical Social Work Discharge Planning Services: CM Consult   Living arrangements for the past 2 months: Single Family Home                                       Social Determinants of Health (SDOH) Interventions    Readmission Risk Interventions     No data to display

## 2022-07-06 NOTE — TOC Initial Note (Signed)
Transition of Care [facility]) - Initial/Assessment Note    Patient Details  Name: Jeremiah Owens MRN: [id number] Date of Birth: 1930-03-24  Transition of Care [facility]) CM/SW Contact:    Carles Collet, RN Phone Number: 07/06/2022, 11:01 AM  Clinical Narrative:                  Jeremiah Owens w patient's wife over the phone with permission from patient.  We discussed dispo and she would like SNF. Patient is currently in observation, with traditional Medicare coverage and does not meet 3 day inpatient criteria, nor does he have a recent qualifying stay. (Do not change obs to inpatient w/o consulting UR RN CM, will be signed  in to care team daily).  Requested CSW to assess if patient qualifies for SNF waiver through [facility].  Disposition also pending palliative meeting.   Expected Discharge Plan: [place]     Patient Goals and CMS Choice Patient states their goals for this hospitalization and ongoing recovery are:: wife would like patient to go to SNF (pending palliative meeting)      Expected Discharge Plan and Services Expected Discharge Plan: Skilled Nursing Facility In-house Referral: Clinical Social Work Discharge Planning Services: CM Consult   Living arrangements for the past 2 months: [place]                                      Prior Living Arrangements/Services Living arrangements for the past 2 months: Single Family Home Lives with:: Spouse                   Activities of Daily Living Home Assistive Devices/Equipment: Environmental consultant (specify type) ADL Screening (condition at time of admission) Patient's cognitive ability adequate to safely complete daily activities?: Yes Is the patient deaf or have difficulty hearing?: Yes Does the patient have difficulty seeing, even when wearing glasses/contacts?: No Does the patient have difficulty concentrating, remembering, or making decisions?: Yes Patient able to express need for assistance with ADLs?: Yes Does  the patient have difficulty dressing or bathing?: Yes Independently performs ADLs?: No Communication: Independent Dressing (OT): Needs assistance Is this a change from baseline?: Pre-admission baseline Grooming: Needs assistance Is this a change from baseline?: Pre-admission baseline Feeding: Independent Bathing: Needs assistance Is this a change from baseline?: Pre-admission baseline Toileting: Needs assistance Is this a change from baseline?: Pre-admission baseline In/Out Bed: Needs assistance Is this a change from baseline?: Pre-admission baseline Walks in Home: Independent with device (comment) Does the patient have difficulty walking or climbing stairs?: Yes Weakness of Legs: None Weakness of Arms/Hands: None  Permission Sought/Granted                  Emotional Assessment              Admission diagnosis:  UTI (urinary tract infection) [N39.0] Acute cystitis without hematuria [N30.00] Patient Active Problem List   Diagnosis Date Noted   UTI (urinary tract infection) 07/04/2022   Stage 3a chronic kidney disease (CKD) ([place]) 07/04/2022   Disorder of left rotator cuff 08/08/2020   Multiple myeloma not having achieved remission ([place]) 10/24/2015   Dysphagia as late effect of stroke 07/10/2015   Diarrhea 04/25/2015   Cancer associated pain 04/25/2015   Long term current use of anticoagulant therapy 04/25/2015   Basal ganglia infarction ([place]) 02/12/2015   Dizziness    Expressive aphasia    Past  pointing    Right leg weakness    History of stroke 02/11/2015   Cerebral infarction ([place]) 02/11/2015   Aspiration pneumonia ([place]) 02/11/2015   Aphasia    Difficulty speaking    Speech difficult to understand    Encounter for antineoplastic chemotherapy 01/31/2015   Constipation 12/20/2014   Rash 12/20/2014   Peripheral edema 12/06/2014   Multifactorial gait disorder 09/05/2014   Multiple myeloma ([place])    Paraplegia ([place]) 06/21/2014   Postoperative anemia due to  acute blood loss 06/21/2014   Neoplasm of thoracic spine 06/20/2014   Metastasis to spinal column ([place]) 06/17/2014   Thoracic spine tumor 06/17/2014   Metastatic bone cancer 06/15/2014   PCP:  Wenda Low, MD Pharmacy:   [facility] [place], [place] [address] Phone: [phone] Fax: [phone]  [facility] [place], [place] [address] [place] [address] Phone: [phone] Fax: [phone]     Social Determinants of Health (SDOH) Interventions    Readmission Risk Interventions     No data to display

## 2022-07-06 NOTE — Care Management Obs Status (Signed)
[place] NOTIFICATION   Patient Details  Name: Jeremiah Owens MRN: [id number] Date of Birth: 1929-12-03   Medicare Observation Status Notification Given:  Yes    Carles Collet, RN 07/06/2022, 11:00 AM

## 2022-07-06 NOTE — Progress Notes (Signed)
PROGRESS NOTE    Ilir Mahrt  QIH:[id number] DOB: 10-06-1929 DOA: 07/04/2022 PCP: Wenda Low, MD   Brief Narrative:  88 y.o. male with medical history significant for multiple myeloma, hypothyroidism, CKD 3A, aortic stenosis, and history of CVA presented with dysuria, fatigue and loss of appetite.  On presentation, creatinine was 1.30; chest x-ray showed moderate right-sided pleural effusion.  UA was suspicious of UTI.  He was started on IV antibiotics.  Pulmonary and palliative care were consulted.  Assessment & Plan:   UTI/acute cystitis--present on admission -Continue Rocephin.  Follow cultures.  Moderate right-sided pleural effusion -Currently on room air.  Not tachypneic.  History of multiple myeloma: Unclear if this is malignancy related pleural effusion.   -Pulmonary following.  History of multiple myeloma -Status post chemotherapy and radiation - currently treated with Zometa infusions under the care of Dr. Julien Nordmann  - Continue oncology follow-up on discharge   History of unspecified CVA  - Continue ASA    CKD IIIa  -Creatinine currently at baseline.  Lasix on hold.  Monitor.  Anemia of chronic disease -Possibly from chronic kidney disease.  Hemoglobin stable.  BPH -Continue Flomax   Hypothyroidism  - Continue Synthroid   Physical deconditioning -PT recommends SNF placement.  Consult TOC.  Goals of care -Palliative care following.  Remains full code.  DVT prophylaxis: Subcutaneous heparin Code Status: Full Family Communication: None at bedside Disposition Plan: Status is: Observation The patient will require care spanning > 2 midnights and should be moved to inpatient because: Of severity of illness.  Need for IV antibiotics.  PT recommending SNF placement.  Consultants: palliative care.  Pulmonary  Procedures: None  Antimicrobials: Rocephin from 07/04/2022 onwards   Subjective: Patient seen and examined at bedside.  Hard of hearing.  Poor  historian.  No fever, vomiting, chest pain reported.  Denies worsening shortness of breath.  Still feels very weak.  Objective: Vitals:   07/05/22 1229 07/05/22 1655 07/05/22 2200 07/06/22 0316  BP:  (!) 111/43 110/70 138/76  Pulse: 89 (!) 42 77 81  Resp:  _0 Temp:  97.7 F (36.5 C) 98.1 F (36.7 C) 98.3 F (36.8 C)  TempSrc:  Oral Oral Oral  SpO2: 96% 99% 97% 95%    Intake/Output Summary (Last 24 hours) at 07/06/2022 0753 Last data filed at 07/06/2022 0600 Gross per 24 hour  Intake 240 ml  Output 350 ml  Net -110 ml    There were no vitals filed for this visit.  Examination:  General: On room air.  No distress.  Looks chronically ill and deconditioned.  Extremely hard of hearing.  Poor historian. ENT/neck: No thyromegaly.  JVD is not elevated  respiratory: Decreased breath sounds at bases bilaterally with some crackles; no wheezing  CVS: S1-S2 heard, rate controlled currently Abdominal: Soft, nontender, slightly distended; no organomegaly, bowel sounds are heard Extremities: Trace lower extremity edema; no cyanosis  CNS: Awake; extremely slow to respond.  No focal neurologic deficit.  Moves extremities Lymph: No obvious lymphadenopathy Skin: No obvious ecchymosis/lesions  psych: Not agitated.  Flat affect. musculoskeletal: No obvious joint swelling/deformity     Data Reviewed: I have personally reviewed following labs and imaging studies  CBC: Recent Labs  Lab 07/04/22 1448 07/05/22 0349 07/06/22 0301  WBC 7.6 7.7 6.1  NEUTROABS 6.0  --  4.4  HGB 11.2* 11.0* 11.1*  HCT 35.5* 33.5* 33.0*  MCV 88.3 84.6 83.8  PLT 256 232 875    Basic Metabolic Panel: Recent  Labs  Lab 07/04/22 1448 07/05/22 0349 07/06/22 0301  NA 143 141 140  K 4.2 3.7 3.5  CL 106 106 106  CO2 _0 GLUCOSE 96 105* 92  BUN 31* 28* 26*  CREATININE 1.30* 1.18 1.08  CALCIUM 9.6 9.1 8.6*  MG  --   --  2.0    GFR: CrCl cannot be calculated (Unknown ideal weight.). Liver  Function Tests: Recent Labs  Lab 07/04/22 1448  AST 33  ALT 30  ALKPHOS 82  BILITOT 0.5  PROT 6.7  ALBUMIN 3.1*    No results for input(s): "LIPASE", "AMYLASE" in the last 168 hours. No results for input(s): "AMMONIA" in the last 168 hours. Coagulation Profile: No results for input(s): "INR", "PROTIME" in the last 168 hours. Cardiac Enzymes: No results for input(s): "CKTOTAL", "CKMB", "CKMBINDEX", "TROPONINI" in the last 168 hours. BNP (last 3 results) No results for input(s): "PROBNP" in the last 8760 hours. HbA1C: No results for input(s): "HGBA1C" in the last 72 hours. CBG: No results for input(s): "GLUCAP" in the last 168 hours. Lipid Profile: No results for input(s): "CHOL", "HDL", "LDLCALC", "TRIG", "CHOLHDL", "LDLDIRECT" in the last 72 hours. Thyroid Function Tests: Recent Labs    07/06/22 0301  TSH 15.015*   Anemia Panel: Recent Labs    07/06/22 0301  VITAMINB12 568   Sepsis Labs: Recent Labs  Lab 07/04/22 2329  LATICACIDVEN 1.2     Recent Results (from the past 240 hour(s))  Urine Culture     Status: Abnormal (Preliminary result)   Collection Time: 07/04/22  2:56 PM   Specimen: Urine, Clean Catch  Result Value Ref Range Status   Specimen Description URINE, CLEAN CATCH  Final   Special Requests NONE  Final   Culture (A)  Final    >=100,000 COLONIES/mL GRAM NEGATIVE RODS IDENTIFICATION AND SUSCEPTIBILITIES TO FOLLOW Performed at [facility], Start [address], [place], [place] [address]    Report Status PENDING  Incomplete         Radiology Studies: DG Chest 2 View  Result Date: 07/04/2022 CLINICAL DATA:  Weakness. EXAM: CHEST - 2 VIEW COMPARISON:  March 27, 2022. FINDINGS: The heart size and mediastinal contours are within normal limits. Moderate size right pleural effusion is noted with associated atelectasis. Left lung is clear. Status post surgical posterior fusion of midthoracic spine is noted with old midthoracic fracture.  IMPRESSION: Moderate size right pleural effusion is noted with associated atelectasis. Electronically Signed   By: Marijo Conception M.D.   On: 07/04/2022 16:02        Scheduled Meds:  aspirin EC  81 mg Oral Daily   heparin  5,000 Units Subcutaneous Q8H   levothyroxine  125 mcg Oral Q0600   pantoprazole  40 mg Oral Daily   tamsulosin  0.4 mg Oral QHS   Continuous Infusions:  cefTRIAXone (ROCEPHIN)  IV 1 g (07/05/22 2259)          Aline August, MD [facility] 07/06/2022, 7:53 AM

## 2022-07-07 ENCOUNTER — Observation Stay (HOSPITAL_COMMUNITY): Payer: No Typology Code available for payment source

## 2022-07-07 DIAGNOSIS — R1312 Dysphagia, oropharyngeal phase: Secondary | ICD-10-CM | POA: Diagnosis not present

## 2022-07-07 DIAGNOSIS — G893 Neoplasm related pain (acute) (chronic): Secondary | ICD-10-CM | POA: Diagnosis not present

## 2022-07-07 DIAGNOSIS — J90 Pleural effusion, not elsewhere classified: Secondary | ICD-10-CM | POA: Insufficient documentation

## 2022-07-07 DIAGNOSIS — R42 Dizziness and giddiness: Secondary | ICD-10-CM | POA: Diagnosis not present

## 2022-07-07 DIAGNOSIS — J948 Other specified pleural conditions: Secondary | ICD-10-CM | POA: Diagnosis not present

## 2022-07-07 DIAGNOSIS — M6281 Muscle weakness (generalized): Secondary | ICD-10-CM | POA: Diagnosis not present

## 2022-07-07 DIAGNOSIS — Z7401 Bed confinement status: Secondary | ICD-10-CM | POA: Diagnosis not present

## 2022-07-07 DIAGNOSIS — Z5111 Encounter for antineoplastic chemotherapy: Secondary | ICD-10-CM | POA: Diagnosis not present

## 2022-07-07 DIAGNOSIS — R197 Diarrhea, unspecified: Secondary | ICD-10-CM | POA: Diagnosis not present

## 2022-07-07 DIAGNOSIS — Z8579 Personal history of other malignant neoplasms of lymphoid, hematopoietic and related tissues: Secondary | ICD-10-CM | POA: Diagnosis not present

## 2022-07-07 DIAGNOSIS — Z87440 Personal history of urinary (tract) infections: Secondary | ICD-10-CM | POA: Diagnosis not present

## 2022-07-07 DIAGNOSIS — M67912 Unspecified disorder of synovium and tendon, left shoulder: Secondary | ICD-10-CM | POA: Diagnosis not present

## 2022-07-07 DIAGNOSIS — J690 Pneumonitis due to inhalation of food and vomit: Secondary | ICD-10-CM | POA: Diagnosis not present

## 2022-07-07 DIAGNOSIS — C9000 Multiple myeloma not having achieved remission: Secondary | ICD-10-CM | POA: Diagnosis not present

## 2022-07-07 DIAGNOSIS — R531 Weakness: Secondary | ICD-10-CM | POA: Diagnosis not present

## 2022-07-07 DIAGNOSIS — R54 Age-related physical debility: Secondary | ICD-10-CM | POA: Diagnosis present

## 2022-07-07 DIAGNOSIS — I69391 Dysphagia following cerebral infarction: Secondary | ICD-10-CM | POA: Diagnosis not present

## 2022-07-07 DIAGNOSIS — Z7901 Long term (current) use of anticoagulants: Secondary | ICD-10-CM | POA: Diagnosis not present

## 2022-07-07 DIAGNOSIS — Z66 Do not resuscitate: Secondary | ICD-10-CM | POA: Diagnosis present

## 2022-07-07 DIAGNOSIS — Z8546 Personal history of malignant neoplasm of prostate: Secondary | ICD-10-CM | POA: Diagnosis not present

## 2022-07-07 DIAGNOSIS — Z85820 Personal history of malignant melanoma of skin: Secondary | ICD-10-CM | POA: Diagnosis not present

## 2022-07-07 DIAGNOSIS — K5900 Constipation, unspecified: Secondary | ICD-10-CM | POA: Diagnosis not present

## 2022-07-07 DIAGNOSIS — Z515 Encounter for palliative care: Secondary | ICD-10-CM | POA: Diagnosis not present

## 2022-07-07 DIAGNOSIS — Z803 Family history of malignant neoplasm of breast: Secondary | ICD-10-CM | POA: Diagnosis not present

## 2022-07-07 DIAGNOSIS — R52 Pain, unspecified: Secondary | ICD-10-CM | POA: Diagnosis not present

## 2022-07-07 DIAGNOSIS — N400 Enlarged prostate without lower urinary tract symptoms: Secondary | ICD-10-CM | POA: Diagnosis present

## 2022-07-07 DIAGNOSIS — R2689 Other abnormalities of gait and mobility: Secondary | ICD-10-CM | POA: Diagnosis not present

## 2022-07-07 DIAGNOSIS — F419 Anxiety disorder, unspecified: Secondary | ICD-10-CM | POA: Diagnosis present

## 2022-07-07 DIAGNOSIS — Z823 Family history of stroke: Secondary | ICD-10-CM | POA: Diagnosis not present

## 2022-07-07 DIAGNOSIS — N390 Urinary tract infection, site not specified: Secondary | ICD-10-CM | POA: Diagnosis not present

## 2022-07-07 DIAGNOSIS — R278 Other lack of coordination: Secondary | ICD-10-CM | POA: Diagnosis not present

## 2022-07-07 DIAGNOSIS — Z923 Personal history of irradiation: Secondary | ICD-10-CM | POA: Diagnosis not present

## 2022-07-07 DIAGNOSIS — R4701 Aphasia: Secondary | ICD-10-CM | POA: Diagnosis not present

## 2022-07-07 DIAGNOSIS — H9190 Unspecified hearing loss, unspecified ear: Secondary | ICD-10-CM | POA: Diagnosis present

## 2022-07-07 DIAGNOSIS — R479 Unspecified speech disturbances: Secondary | ICD-10-CM | POA: Diagnosis not present

## 2022-07-07 DIAGNOSIS — I350 Nonrheumatic aortic (valve) stenosis: Secondary | ICD-10-CM | POA: Diagnosis present

## 2022-07-07 DIAGNOSIS — B964 Proteus (mirabilis) (morganii) as the cause of diseases classified elsewhere: Secondary | ICD-10-CM | POA: Diagnosis present

## 2022-07-07 DIAGNOSIS — I6381 Other cerebral infarction due to occlusion or stenosis of small artery: Secondary | ICD-10-CM | POA: Diagnosis not present

## 2022-07-07 DIAGNOSIS — E039 Hypothyroidism, unspecified: Secondary | ICD-10-CM | POA: Diagnosis present

## 2022-07-07 DIAGNOSIS — D631 Anemia in chronic kidney disease: Secondary | ICD-10-CM | POA: Diagnosis present

## 2022-07-07 DIAGNOSIS — R21 Rash and other nonspecific skin eruption: Secondary | ICD-10-CM | POA: Diagnosis not present

## 2022-07-07 DIAGNOSIS — R29898 Other symptoms and signs involving the musculoskeletal system: Secondary | ICD-10-CM | POA: Diagnosis not present

## 2022-07-07 DIAGNOSIS — N1831 Chronic kidney disease, stage 3a: Secondary | ICD-10-CM | POA: Diagnosis not present

## 2022-07-07 DIAGNOSIS — N3000 Acute cystitis without hematuria: Secondary | ICD-10-CM

## 2022-07-07 DIAGNOSIS — Z8673 Personal history of transient ischemic attack (TIA), and cerebral infarction without residual deficits: Secondary | ICD-10-CM | POA: Diagnosis not present

## 2022-07-07 DIAGNOSIS — I639 Cerebral infarction, unspecified: Secondary | ICD-10-CM | POA: Diagnosis not present

## 2022-07-07 DIAGNOSIS — I493 Ventricular premature depolarization: Secondary | ICD-10-CM | POA: Diagnosis present

## 2022-07-07 DIAGNOSIS — Z7189 Other specified counseling: Secondary | ICD-10-CM | POA: Diagnosis not present

## 2022-07-07 DIAGNOSIS — Z85828 Personal history of other malignant neoplasm of skin: Secondary | ICD-10-CM | POA: Diagnosis not present

## 2022-07-07 DIAGNOSIS — Z9181 History of falling: Secondary | ICD-10-CM | POA: Diagnosis not present

## 2022-07-07 DIAGNOSIS — J9811 Atelectasis: Secondary | ICD-10-CM | POA: Diagnosis present

## 2022-07-07 HISTORY — PX: IR THORACENTESIS ASP PLEURAL SPACE W/IMG GUIDE: IMG5380

## 2022-07-07 LAB — BODY FLUID CELL COUNT WITH DIFFERENTIAL
Eos, Fluid: 5 %
Lymphs, Fluid: 61 %
Monocyte-Macrophage-Serous Fluid: 17 % — ABNORMAL LOW (ref 50–90)
Neutrophil Count, Fluid: 17 % (ref 0–25)
Total Nucleated Cell Count, Fluid: 45 cu mm (ref 0–1000)

## 2022-07-07 LAB — LACTATE DEHYDROGENASE, PLEURAL OR PERITONEAL FLUID: LD, Fluid: 374 U/L — ABNORMAL HIGH (ref 3–23)

## 2022-07-07 LAB — PROTEIN, PLEURAL OR PERITONEAL FLUID: Total protein, fluid: 3.7 g/dL

## 2022-07-07 LAB — GRAM STAIN: Gram Stain: NONE SEEN

## 2022-07-07 NOTE — Progress Notes (Addendum)
Speech Language Pathology Treatment: Dysphagia  Patient Details Name: Jeremiah Owens MRN: [id number] DOB: 11/08/1929 Today's Date: 07/07/2022 Time: 1040- 1112: 32 minutes    Assessment / Plan / Recommendation Clinical Impression  Mr. Leard was seen for dysphagia tx after thoracentesis.  Breakfast tray was re-heated and prepared for self-feeding. Pt fed himself dysphagia 2, nectar thick liquids with ongoing frequent throat-clearing, sub-swallows and wet voice (consistent with prior level of function according to review of SLP notes ).Trials of thin liquids prior to meal revealed coughing and concern for aspiration.  Recommend that he stay on current diet with nectar thick liquids for now.  He may benefit from MBS while admitted.  Hopefully as overall strength improves we will see concomitant gains in swallow function.  D/W Tasha Dove from Palliative Care. SLP will follow.   HPI HPI: 88 y.o. male admitted with UTI, weakness and loss of appetite. Pulmonary consulted for right pleural effusion. Thoracentesis 12/4. Palliative Care is following.  Prior medical history significant for multiple myeloma, hypothyroidism, CKD 3A, aortic stenosis, left BG CVA in 2016 and hx of chronic dysphagia, for which he has received therapy as recently as January 2022.  MBS 05/14/20 and 09/17/20 revealed reduced pharyngeal contraction, occasional silent aspiration with thin liquids, incomplete pharyngeal clearance. Pt/family have been advised  in the past that swallowing may be susceptible to acute medical changes.      SLP Plan  Continue with current plan of care;MBS      Recommendations for follow up therapy are one component of a multi-disciplinary discharge planning process, led by the attending physician.  Recommendations may be updated based on patient status, additional functional criteria and insurance authorization.    Recommendations  Diet recommendations: Dysphagia 2 (fine chop);Nectar-thick liquid Liquids  provided via: Cup;Straw Medication Administration: Crushed with puree Supervision: Patient able to self feed (assist with set up) Compensations: Minimize environmental distractions Postural Changes and/or Swallow Maneuvers: Seated upright 90 degrees                Oral Care Recommendations: Oral care BID Follow Up Recommendations: Acute inpatient rehab (3hours/day) Assistance recommended at discharge: Frequent or constant Supervision/Assistance SLP Visit Diagnosis: Dysphagia, oropharyngeal phase (R13.12) Plan: Continue with current plan of care;MBS          L. , MA CCC/SLP Clinical Specialist - Acute Care SLP Acute Rehabilitation Services Office number [phone]   ,  Laurice  07/07/2022, 11:06 AM 

## 2022-07-07 NOTE — Progress Notes (Signed)
PROGRESS NOTE    Jeremiah Owens  LAG:[id number] DOB: [birth year] DOA: 07/04/2022 PCP: Wenda Low, MD   Brief Narrative:  88 y.o. male with medical history significant for multiple myeloma, hypothyroidism, CKD 3A, aortic stenosis, and history of CVA presented with dysuria, fatigue and loss of appetite.  On presentation, creatinine was 1.30; chest x-ray showed moderate right-sided pleural effusion.  UA was suspicious of UTI.  He was started on IV antibiotics.  Pulmonary and palliative care were consulted.  Assessment & Plan:   UTI/acute cystitis--present on admission -Continue Rocephin.  Urine cultures grew Proteus mirabilis  Moderate right-sided pleural effusion -Currently on room air.  Not tachypneic.  History of multiple myeloma: Unclear if this is malignancy related pleural effusion.   -Pulmonary recommended IR consultation for thoracentesis.  History of multiple myeloma -Status post chemotherapy and radiation - currently treated with Zometa infusions under the care of Dr. Julien Nordmann  - Continue oncology follow-up on discharge   History of unspecified CVA  - Continue ASA    CKD IIIa  -Creatinine currently at baseline.  Lasix on hold.  Monitor.  Anemia of chronic disease -Possibly from chronic kidney disease.  Hemoglobin stable.  BPH -Continue Flomax   Hypothyroidism  - Continue Synthroid   Physical deconditioning -PT recommended SNF placement.  TOC following.  Goals of care -Palliative care following.  Remains full code.  DVT prophylaxis: Subcutaneous heparin Code Status: Full Family Communication: None at bedside Disposition Plan: Status is: Observation The patient will require care spanning > 2 midnights and should be moved to inpatient because: Of severity of illness.  Need for IV antibiotics.  PT recommending SNF placement.  Consultants: palliative care.  Pulmonary.  IR  Procedures: None  Antimicrobials: Rocephin from 07/04/2022  onwards   Subjective: Patient seen and examined at bedside.  Hard of hearing.  Poor historian.  No agitation, seizures, fever or vomiting reported. Objective: Vitals:   07/06/22 0806 07/06/22 1648 07/06/22 2009 07/07/22 0515  BP: (!) 142/69 122/78 (!) 160/94 (!) 118/56  Pulse: 84 85 88 89  Resp: _0 Temp: 97.8 F (36.6 C) 97.7 F (36.5 C) 97.8 F (36.6 C) 98.4 F (36.9 C)  TempSrc: Oral Oral Oral Oral  SpO2: 97% 98% 96% 96%    Intake/Output Summary (Last 24 hours) at 07/07/2022 0714 Last data filed at 07/07/2022 0457 Gross per 24 hour  Intake 580.06 ml  Output 1000 ml  Net -419.94 ml    There were no vitals filed for this visit.  Examination:  General: No distress currently.  On room air currently.  Looks chronically ill and deconditioned.  Extremely hard of hearing.  Poor historian. ENT/neck: No palpable neck masses or JVD elevation noted  respiratory: Bilateral decreased breath sounds at bases with scattered crackles CVS: Mostly rate controlled; S1 and S2 are heard Abdominal: Soft, nontender, distended mildly; no organomegaly, normal bowel sounds heard  extremities: No clubbing; mild lower extremity edema. CNS: Still very slow to respond and awake.  No focal neurologic deficit.  Able to move extremities lymph: No obvious palpable lymphadenopathy noted Skin: No obvious petechiae/rashes psych: Affect is still flat.  No signs of agitation  musculoskeletal: No obvious joint erythema/tenderness    Data Reviewed: I have personally reviewed following labs and imaging studies  CBC: Recent Labs  Lab 07/04/22 1448 07/05/22 0349 07/06/22 0301  WBC 7.6 7.7 6.1  NEUTROABS 6.0  --  4.4  HGB 11.2* 11.0* 11.1*  HCT 35.5* 33.5* 33.0*  MCV 88.3  84.6 83.8  PLT 256 232 161    Basic Metabolic Panel: Recent Labs  Lab 07/04/22 1448 07/05/22 0349 07/06/22 0301  NA 143 141 140  K 4.2 3.7 3.5  CL 106 106 106  CO2 _0 GLUCOSE 96 105* 92  BUN 31* 28* 26*   CREATININE 1.30* 1.18 1.08  CALCIUM 9.6 9.1 8.6*  MG  --   --  2.0    GFR: CrCl cannot be calculated (Unknown ideal weight.). Liver Function Tests: Recent Labs  Lab 07/04/22 1448  AST 33  ALT 30  ALKPHOS 82  BILITOT 0.5  PROT 6.7  ALBUMIN 3.1*    No results for input(s): "LIPASE", "AMYLASE" in the last 168 hours. No results for input(s): "AMMONIA" in the last 168 hours. Coagulation Profile: No results for input(s): "INR", "PROTIME" in the last 168 hours. Cardiac Enzymes: No results for input(s): "CKTOTAL", "CKMB", "CKMBINDEX", "TROPONINI" in the last 168 hours. BNP (last 3 results) No results for input(s): "PROBNP" in the last 8760 hours. HbA1C: No results for input(s): "HGBA1C" in the last 72 hours. CBG: No results for input(s): "GLUCAP" in the last 168 hours. Lipid Profile: No results for input(s): "CHOL", "HDL", "LDLCALC", "TRIG", "CHOLHDL", "LDLDIRECT" in the last 72 hours. Thyroid Function Tests: Recent Labs    07/06/22 0301  TSH 15.015*    Anemia Panel: Recent Labs    07/06/22 0301  VITAMINB12 568    Sepsis Labs: Recent Labs  Lab 07/04/22 2329  LATICACIDVEN 1.2     Recent Results (from the past 240 hour(s))  Urine Culture     Status: Abnormal   Collection Time: 07/04/22  2:56 PM   Specimen: Urine, Clean Catch  Result Value Ref Range Status   Specimen Description URINE, CLEAN CATCH  Final   Special Requests   Final    NONE Performed at [facility], [place] [address] [place], [place] [address]    Culture >=100,000 COLONIES/mL PROTEUS MIRABILIS (A)  Final   Report Status 07/06/2022 FINAL  Final   Organism ID, Bacteria PROTEUS MIRABILIS (A)  Final      Susceptibility   Proteus mirabilis - MIC*    AMPICILLIN <=2 SENSITIVE Sensitive     CEFAZOLIN <=4 SENSITIVE Sensitive     CEFEPIME <=0.12 SENSITIVE Sensitive     CEFTRIAXONE <=0.25 SENSITIVE Sensitive     CIPROFLOXACIN <=0.25 SENSITIVE Sensitive     GENTAMICIN <=1 SENSITIVE Sensitive      IMIPENEM 1 SENSITIVE Sensitive     NITROFURANTOIN 128 RESISTANT Resistant     TRIMETH/SULFA <=20 SENSITIVE Sensitive     AMPICILLIN/SULBACTAM <=2 SENSITIVE Sensitive     PIP/TAZO <=4 SENSITIVE Sensitive     * >=100,000 COLONIES/mL PROTEUS MIRABILIS         Radiology Studies: No results found.      Scheduled Meds:  aspirin EC  81 mg Oral Daily   heparin  5,000 Units Subcutaneous Q8H   levothyroxine  125 mcg Oral Q0600   pantoprazole  40 mg Oral Daily   tamsulosin  0.4 mg Oral QHS   Continuous Infusions:  cefTRIAXone (ROCEPHIN)  IV 1 g (07/06/22 2206)          Aline August, MD [facility] 07/07/2022, 7:14 AM

## 2022-07-07 NOTE — Procedures (Signed)
PROCEDURE SUMMARY:  Successful US guided right thoracentesis. Yielded 600 ml of blood-tinged fluid. Pt tolerated procedure well. No immediate complications.  Specimen sent for labs. CXR ordered; no post-procedure pneumothorax identified  EBL < 2 mL  Theresa Duty, NP 07/07/2022 10:42 AM

## 2022-07-07 NOTE — TOC Progression Note (Addendum)
Transition of Care [facility]) - Progression Note    Patient Details  Name: Jeremiah Owens MRN: [id number] Date of Birth: October 23, 1929  Transition of Care [facility]) CM/SW [place], RN Phone Number: 07/07/2022, 11:52 AM  Clinical Narrative:    CM met with the patient at the bedside and patient is agreeable to SNF placement - S/P thoracentesis.  Patient's clinicals were faxed out in the hub - pending bed offers.  The patient is extremely hard of hearing and left his hearing aides at home.  I called and left a voicemail message with his wife to discuss SNF placement and Outpatient palliative care referral.  CM will continue to follow the patient for SNF placement.  07/07/2022 1508 - Patient has multiple bed offers.  I called the patient's wife and gave her Medicare choice regarding SNF placement and the wife chose [place].  No insurance authorization is needed since patient has traditional Medicare.  [place], [place] [name] is aware and patient will likely be medically stable for discharge tomorrow.  I gave the patient's wife choice regarding Palliative Care providers and the wife chose [facility].  Earnestine Mealing, MSW is aware and accepted the referral.   Expected Discharge Plan: [place] Barriers to Discharge: Continued Medical Work up  Expected Discharge Plan and Services Expected Discharge Plan: [place] In-house Referral: Clinical Social Work Discharge Planning Services: CM Consult Post Acute Care Choice: [place] Living arrangements for the past 2 months: Single Family Home                                       Social Determinants of Health (SDOH) Interventions    Readmission Risk Interventions     No data to display

## 2022-07-07 NOTE — Progress Notes (Signed)
Palliative: Mr. Stowers is resting quietly in bed.  He appears acutely/chronically ill and quite frail.  He is alert and oriented, able to make his needs known.  There is no family at bedside at this time.  Speech therapy is present.  Mr. Uecker tells me that he went for thoracentesis this morning and feels that his breathing is better.  We talk about testing on pleural fluid, will be some time before results.  We talk about UTI and the treatment plan.  Overall it seems that Mr. Whitefield is improving.  We talk about PT evaluation and short-term rehab.  I shared that rehab is a bridge to return home safely.  I reassured Mr. Bun that the medical team and myself are working closely with his wife.  No questions at this time.  Face-to-face conference with speech therapy and transition of care team.  Chat with attending and bedside nursing staff related to patient condition, needs, goals of care, disposition.  Plan: Continue to treat the treatable.  Short-term rehab with ultimate goal of returning home..  Outpatient palliative, provider choice undecided.  37 minutes  Quinn Axe, NP Palliative medicine team Team phone [phone] Greater than 50% of this time was spent counseling and coordinating care related to the above assessment and plan.

## 2022-07-07 NOTE — Progress Notes (Signed)
[facility] 2W11 [facility]) Hospital Liaison note:  Notified by Leona Carry of request for [facility] Palliative Care services. Will continue to follow for disposition.  Please call with any outpatient palliative questions or concerns.  Thank you for the opportunity to participate in this patient's care.  Thank you, Lorelee Market, LPN [facility] Liaison [phone]

## 2022-07-08 DIAGNOSIS — C7951 Secondary malignant neoplasm of bone: Secondary | ICD-10-CM | POA: Diagnosis not present

## 2022-07-08 DIAGNOSIS — Z85828 Personal history of other malignant neoplasm of skin: Secondary | ICD-10-CM | POA: Diagnosis not present

## 2022-07-08 DIAGNOSIS — Z7901 Long term (current) use of anticoagulants: Secondary | ICD-10-CM | POA: Diagnosis not present

## 2022-07-08 DIAGNOSIS — C9000 Multiple myeloma not having achieved remission: Secondary | ICD-10-CM | POA: Diagnosis not present

## 2022-07-08 DIAGNOSIS — R5383 Other fatigue: Secondary | ICD-10-CM | POA: Diagnosis not present

## 2022-07-08 DIAGNOSIS — N3000 Acute cystitis without hematuria: Secondary | ICD-10-CM | POA: Diagnosis not present

## 2022-07-08 DIAGNOSIS — Z8546 Personal history of malignant neoplasm of prostate: Secondary | ICD-10-CM | POA: Diagnosis not present

## 2022-07-08 DIAGNOSIS — R479 Unspecified speech disturbances: Secondary | ICD-10-CM | POA: Diagnosis not present

## 2022-07-08 DIAGNOSIS — R0689 Other abnormalities of breathing: Secondary | ICD-10-CM | POA: Diagnosis not present

## 2022-07-08 DIAGNOSIS — M67912 Unspecified disorder of synovium and tendon, left shoulder: Secondary | ICD-10-CM | POA: Diagnosis not present

## 2022-07-08 DIAGNOSIS — J984 Other disorders of lung: Secondary | ICD-10-CM | POA: Diagnosis not present

## 2022-07-08 DIAGNOSIS — Z515 Encounter for palliative care: Secondary | ICD-10-CM | POA: Diagnosis not present

## 2022-07-08 DIAGNOSIS — R627 Adult failure to thrive: Secondary | ICD-10-CM | POA: Diagnosis not present

## 2022-07-08 DIAGNOSIS — R4701 Aphasia: Secondary | ICD-10-CM | POA: Diagnosis not present

## 2022-07-08 DIAGNOSIS — D649 Anemia, unspecified: Secondary | ICD-10-CM | POA: Diagnosis not present

## 2022-07-08 DIAGNOSIS — Z5111 Encounter for antineoplastic chemotherapy: Secondary | ICD-10-CM | POA: Diagnosis not present

## 2022-07-08 DIAGNOSIS — J90 Pleural effusion, not elsewhere classified: Secondary | ICD-10-CM | POA: Diagnosis not present

## 2022-07-08 DIAGNOSIS — N1831 Chronic kidney disease, stage 3a: Secondary | ICD-10-CM | POA: Diagnosis not present

## 2022-07-08 DIAGNOSIS — J690 Pneumonitis due to inhalation of food and vomit: Secondary | ICD-10-CM | POA: Diagnosis not present

## 2022-07-08 DIAGNOSIS — R278 Other lack of coordination: Secondary | ICD-10-CM | POA: Diagnosis not present

## 2022-07-08 DIAGNOSIS — R21 Rash and other nonspecific skin eruption: Secondary | ICD-10-CM | POA: Diagnosis not present

## 2022-07-08 DIAGNOSIS — I69391 Dysphagia following cerebral infarction: Secondary | ICD-10-CM | POA: Diagnosis not present

## 2022-07-08 DIAGNOSIS — Z923 Personal history of irradiation: Secondary | ICD-10-CM | POA: Diagnosis not present

## 2022-07-08 DIAGNOSIS — R197 Diarrhea, unspecified: Secondary | ICD-10-CM | POA: Diagnosis not present

## 2022-07-08 DIAGNOSIS — K5900 Constipation, unspecified: Secondary | ICD-10-CM | POA: Diagnosis not present

## 2022-07-08 DIAGNOSIS — E440 Moderate protein-calorie malnutrition: Secondary | ICD-10-CM | POA: Diagnosis not present

## 2022-07-08 DIAGNOSIS — R531 Weakness: Secondary | ICD-10-CM | POA: Diagnosis not present

## 2022-07-08 DIAGNOSIS — R42 Dizziness and giddiness: Secondary | ICD-10-CM | POA: Diagnosis not present

## 2022-07-08 DIAGNOSIS — E039 Hypothyroidism, unspecified: Secondary | ICD-10-CM | POA: Diagnosis not present

## 2022-07-08 DIAGNOSIS — R52 Pain, unspecified: Secondary | ICD-10-CM | POA: Diagnosis not present

## 2022-07-08 DIAGNOSIS — G893 Neoplasm related pain (acute) (chronic): Secondary | ICD-10-CM | POA: Diagnosis not present

## 2022-07-08 DIAGNOSIS — Z7401 Bed confinement status: Secondary | ICD-10-CM | POA: Diagnosis not present

## 2022-07-08 DIAGNOSIS — I6381 Other cerebral infarction due to occlusion or stenosis of small artery: Secondary | ICD-10-CM | POA: Diagnosis not present

## 2022-07-08 DIAGNOSIS — M6281 Muscle weakness (generalized): Secondary | ICD-10-CM | POA: Diagnosis not present

## 2022-07-08 DIAGNOSIS — R918 Other nonspecific abnormal finding of lung field: Secondary | ICD-10-CM | POA: Diagnosis not present

## 2022-07-08 DIAGNOSIS — N390 Urinary tract infection, site not specified: Secondary | ICD-10-CM | POA: Diagnosis not present

## 2022-07-08 DIAGNOSIS — R2232 Localized swelling, mass and lump, left upper limb: Secondary | ICD-10-CM | POA: Diagnosis not present

## 2022-07-08 DIAGNOSIS — Z8673 Personal history of transient ischemic attack (TIA), and cerebral infarction without residual deficits: Secondary | ICD-10-CM | POA: Diagnosis not present

## 2022-07-08 DIAGNOSIS — R1312 Dysphagia, oropharyngeal phase: Secondary | ICD-10-CM | POA: Diagnosis not present

## 2022-07-08 DIAGNOSIS — Z9181 History of falling: Secondary | ICD-10-CM | POA: Diagnosis not present

## 2022-07-08 DIAGNOSIS — Z85820 Personal history of malignant melanoma of skin: Secondary | ICD-10-CM | POA: Diagnosis not present

## 2022-07-08 DIAGNOSIS — Z8709 Personal history of other diseases of the respiratory system: Secondary | ICD-10-CM | POA: Diagnosis not present

## 2022-07-08 DIAGNOSIS — D492 Neoplasm of unspecified behavior of bone, soft tissue, and skin: Secondary | ICD-10-CM | POA: Diagnosis not present

## 2022-07-08 DIAGNOSIS — R29898 Other symptoms and signs involving the musculoskeletal system: Secondary | ICD-10-CM | POA: Diagnosis not present

## 2022-07-08 DIAGNOSIS — N182 Chronic kidney disease, stage 2 (mild): Secondary | ICD-10-CM | POA: Diagnosis not present

## 2022-07-08 DIAGNOSIS — I639 Cerebral infarction, unspecified: Secondary | ICD-10-CM | POA: Diagnosis not present

## 2022-07-08 DIAGNOSIS — R2689 Other abnormalities of gait and mobility: Secondary | ICD-10-CM | POA: Diagnosis not present

## 2022-07-08 MED ORDER — CEPHALEXIN 500 MG PO CAPS: 500.0000 mg | ORAL_CAPSULE | Freq: Three times a day (TID) | ORAL | 0 refills | Status: AC

## 2022-07-08 MED ORDER — DICLOFENAC SODIUM 1 % EX GEL: 2.0000 g | Freq: Every day | CUTANEOUS | Status: DC | PRN

## 2022-07-08 NOTE — Progress Notes (Addendum)
Physical Therapy Treatment Patient Details Name: Jeremiah Owens MRN: [id number] DOB: 04/19/1930 Today's Date: 07/08/2022   History of Present Illness 88 y.o. male presents to [facility] hospital on 07/04/2022 with several days of poor appetite, fatigue, dysuria. UA suggestive of UTI, chest x-ray with R pleural effusion. PMH includes anxiety, multiple myeloma with bony mets, compression fx, prostate CA.    PT Comments    Patient needing increased assistance for in room ambulation this session due to posterior bias and difficulty with lateral weight shift for stepping.  VSS thought pt fatigued with some SOB with ambulation, but denies pain.  Kyphosis and lower back stiffness so positioned in chair with feet down for comfort.  Pt remains appropriate for STSNF.  PT will continue to follow till d/c.    Recommendations for follow up therapy are one component of a multi-disciplinary discharge planning process, led by the attending physician.  Recommendations may be updated based on patient status, additional functional criteria and insurance authorization.  Follow Up Recommendations  Skilled nursing-short term rehab (<3 hours/day) Can patient physically be transported by private vehicle: No   Assistance Recommended at Discharge Frequent or constant Supervision/Assistance  Patient can return home with the following A lot of help with walking and/or transfers;A lot of help with bathing/dressing/bathroom;Assistance with cooking/housework;Assist for transportation;Help with stairs or ramp for entrance;Direct supervision/assist for medications management;Direct supervision/assist for financial management   Equipment Recommendations  Wheelchair (measurements PT);BSC/3in1    Recommendations for Other Services       Precautions / Restrictions Precautions Precautions: Fall Restrictions Weight Bearing Restrictions: No     Mobility  Bed Mobility Overal bed mobility: Needs Assistance Bed Mobility: Supine to  Sit     Supine to sit: Mod assist, HOB elevated     General bed mobility comments: assist for legs off EOB and to lift trunk, scoot hips    Transfers Overall transfer level: Needs assistance Equipment used: Rolling walker (2 wheels) Transfers: Sit to/from Stand Sit to Stand: Mod assist, From elevated surface           General transfer comment: assist for anterior weight shift, lifting and to block feet    Ambulation/Gait Ambulation/Gait assistance: Mod assist, Max assist Gait Distance (Feet): 15 Feet Assistive device: Rolling walker (2 wheels) Gait Pattern/deviations: Step-to pattern, Decreased step length - left, Decreased stride length       General Gait Details: leaning posterior, assist for balance, lateral weight shift and cues for stepping, assist to manage walker   Stairs             Wheelchair Mobility    Modified Rankin (Stroke Patients Only)       Balance Overall balance assessment: Needs assistance Sitting-balance support: Feet supported Sitting balance-Leahy Scale: Fair Sitting balance - Comments: donned gown with assist seated EOB no LOB   Standing balance support: Bilateral upper extremity supported, Reliant on assistive device for balance Standing balance-Leahy Scale: Poor Standing balance comment: posterior bias needing support for balance                            Cognition Arousal/Alertness: Awake/alert Behavior During Therapy: Flat affect Overall Cognitive Status: History of cognitive impairments - at baseline                                          Exercises  General Comments General comments (skin integrity, edema, etc.): VSS with mobility on RA SpO2 94% HR 80's      Pertinent Vitals/Pain Pain Assessment Pain Assessment: No/denies pain    Home Living Family/patient expects to be discharged to:: Skilled nursing facility Living Arrangements: Other (Comment)                       Prior Function            PT Goals (current goals can now be found in the care plan section) Progress towards PT goals: Progressing toward goals    Frequency    Min 3X/week      PT Plan Current plan remains appropriate    Co-evaluation              AM-PAC PT "6 Clicks" Mobility   Outcome Measure  Help needed turning from your back to your side while in a flat bed without using bedrails?: A Little Help needed moving from lying on your back to sitting on the side of a flat bed without using bedrails?: A Lot Help needed moving to and from a bed to a chair (including a wheelchair)?: A Lot Help needed standing up from a chair using your arms (e.g., wheelchair or bedside chair)?: A Lot Help needed to walk in hospital room?: Total Help needed climbing 3-5 steps with a railing? : Total 6 Click Score: 11    End of Session Equipment Utilized During Treatment: Gait belt Activity Tolerance: Patient tolerated treatment well Patient left: in chair;with call bell/phone within reach;with chair alarm set   PT Visit Diagnosis: Other abnormalities of gait and mobility (R26.89);Muscle weakness (generalized) (M62.81);Other symptoms and signs involving the nervous system (R29.898)     Time: 5625-6389 PT Time Calculation (min) (ACUTE ONLY): 29 min  Charges:  $Gait Training: 8-22 mins $Therapeutic Activity: 8-22 mins                     Magda Kiel, PT Acute Rehabilitation Services Office:[phone] 07/08/2022    Reginia Naas 07/08/2022, 1:49 PM

## 2022-07-08 NOTE — Discharge Summary (Signed)
Physician Discharge Summary  Jeremiah Owens ENI:[id number] DOB: 1930-05-31 DOA: 07/04/2022  PCP: Wenda Low, MD  Admit date: 07/04/2022 Discharge date: 07/08/2022  Admitted From: Home Disposition: SNF  Recommendations for Outpatient Follow-up:  Follow up with SNF provider at earliest convenience Outpatient follow-up with palliative care Recommend outpatient follow-up with pulmonary Follow up in ED if symptoms worsen or new appear   Home Health: No Equipment/Devices: None  Discharge Condition: Guarded CODE STATUS: Full Diet recommendation: Heart healthy/diet as per ST recommendations  Brief/Interim Summary: 88 y.o. male with medical history significant for multiple myeloma, hypothyroidism, CKD 3A, aortic stenosis, and history of CVA presented with dysuria, fatigue and loss of appetite.  On presentation, creatinine was 1.30; chest x-ray showed moderate right-sided pleural effusion.  UA was suspicious of UTI.  He was started on IV antibiotics.  Pulmonary and palliative care were consulted.  During the hospitalization, his condition has improved.  He underwent ultrasound-guided thoracentesis by IR on 07/07/2022 with removal of 600 cc fluid.  Cytology is pending.  Urine culture grew Proteus.  PT recommended SNF placement.  He currently is full code: Will need outpatient palliative care follow-up.  Discharge patient to SNF once bed is available.  Discharge Diagnoses:   UTI/acute cystitis--present on admission -Currently on Rocephin.  Urine cultures grew Proteus mirabilis.  Switch to oral Keflex on discharge to SNF today.   Moderate right-sided pleural effusion -Currently on room air.  Not tachypneic.  History of multiple myeloma: Unclear if this is malignancy related pleural effusion.   -Pulmonary recommended IR consultation for thoracentesis. He underwent ultrasound-guided thoracentesis by IR on 07/07/2022 with removal of 600 cc fluid.  Cytology is pending.  He can follow-up with pulmonary  as an outpatient   History of multiple myeloma -Status post chemotherapy and radiation - currently treated with Zometa infusions under the care of Dr. Julien Nordmann  - Continue oncology follow-up on discharge    History of unspecified CVA  - Continue ASA    CKD IIIa  -Creatinine currently at baseline.  Resume Lasix on discharge.  Outpatient follow-up.  Anemia of chronic disease -Possibly from chronic kidney disease.  Hemoglobin stable.   BPH -Continue Flomax   Hypothyroidism  - Continue Synthroid    Physical deconditioning -PT recommended SNF placement.    Goals of care -Palliative care following.  Remains full code.  Outpatient follow-up with palliative care.  Discharge Instructions  Discharge Instructions     Diet - low sodium heart healthy   Complete by: As directed    As per SLP recommendations   Increase activity slowly   Complete by: As directed       Allergies as of 07/08/2022       Reactions   Iodine Itching, Rash   Occurred >20 years ago to topical preporation.  Tolerated IV contrast without issue.         Medication List     STOP taking these medications    diphenoxylate-atropine 2.5-0.025 MG tablet Commonly known as: LOMOTIL   methocarbamol 500 MG tablet Commonly known as: ROBAXIN       TAKE these medications    aspirin EC 81 MG tablet Take 81 mg by mouth daily.   CALTRATE 600+D PO Take 600 mg by mouth 2 (two) times daily.   CENTRUM SILVER PO Take 1 tablet by mouth daily.   cephALEXin 500 MG capsule Commonly known as: KEFLEX Take 1 capsule (500 mg total) by mouth 3 (three) times daily for 3 days.   cholecalciferol  1000 units tablet Commonly known as: VITAMIN D Take 1,000 Units by mouth at bedtime.   diclofenac Sodium 1 % Gel Commonly known as: VOLTAREN Apply 2 g topically daily as needed (for body pain).   furosemide 20 MG tablet Commonly known as: LASIX Take 20-40 mg by mouth See admin instructions. Take 2 tablets by mouth  every Monday and Thursday, then take 1 tablet all other days   Klor-Con 10 10 MEQ tablet Generic drug: potassium chloride Take 10 mEq by mouth daily.   levothyroxine 125 MCG tablet Commonly known as: SYNTHROID 1 tablet in the morning on an empty stomach   omeprazole 40 MG capsule Commonly known as: PRILOSEC Take 40 mg by mouth daily as needed (heartburn).   saccharomyces boulardii 250 MG capsule Commonly known as: FLORASTOR Take 250 mg by mouth daily.   tamsulosin 0.4 MG Caps capsule Commonly known as: FLOMAX Take 1 capsule (0.4 mg total) by mouth at bedtime.   Texacort 2.5 % Soln Generic drug: HYDROCORTISONE (TOPICAL) Apply 1 Application topically daily as needed (for scalp).   hydrocortisone 2.5 % lotion Apply 1 application topically at bedtime as needed.        Contact information for follow-up providers     [facility].   Specialty: PALLIATIVE CARE Why: [facility] will be providing palliative care services. Contact information: [place] [phone]             Contact information for after-discharge care     Destination     [facility] .   Service: Skilled Nursing Contact information: [address] [address] [phone]                    Allergies  Allergen Reactions   Iodine Itching and Rash    Occurred >20 years ago to topical preporation.  Tolerated IV contrast without issue.     Consultations: Pulmonary/palliative care   Procedures/Studies: IR THORACENTESIS ASP PLEURAL SPACE W/IMG GUIDE  Result Date: 07/07/2022 INDICATION: Patient with a history of multiple myeloma presents today with right pleural effusion. Interventional radiology asked to perform a diagnostic and therapeutic thoracentesis. EXAM: ULTRASOUND GUIDED THORACENTESIS MEDICATIONS: 1% lidocaine 10 mL COMPLICATIONS: None immediate. PROCEDURE: An ultrasound  guided thoracentesis was thoroughly discussed with the patient and questions answered. The benefits, risks, alternatives and complications were also discussed. The patient understands and wishes to proceed with the procedure. Written consent was obtained. Ultrasound was performed to localize and mark an adequate pocket of fluid in the right chest. The area was then prepped and draped in the normal sterile fashion. 1% Lidocaine was used for local anesthesia. Under ultrasound guidance a 6 Fr Safe-T-Centesis catheter was introduced. Thoracentesis was performed. The catheter was removed and a dressing applied. FINDINGS: A total of approximately 600 mL of blood-tinged fluid was removed. Samples were sent to the laboratory as requested by the clinical team. IMPRESSION: Successful ultrasound guided right thoracentesis yielding 600 mL of pleural fluid. Read by: Soyla Dryer, NP Electronically Signed   By: Aletta Edouard M.D.   On: 07/07/2022 10:57   DG Chest 1 View  Result Date: 07/07/2022 CLINICAL DATA:  Status post thoracentesis.  Pleural effusion. EXAM: CHEST  1 VIEW COMPARISON:  07/04/2022 FINDINGS: Low volume film. Interval decrease in right pleural effusion with persistent right base collapse/consolidation. No evidence for pneumothorax. Cardiopericardial silhouette is at upper limits of normal for size. Upper thoracic spinal fusion hardware again noted.  Bones are diffusely demineralized. IMPRESSION: Interval decrease in right pleural effusion with persistent right base collapse/consolidation. No pneumothorax. Electronically Signed   By: Misty Stanley M.D.   On: 07/07/2022 10:31   DG Chest 2 View  Result Date: 07/04/2022 CLINICAL DATA:  Weakness. EXAM: CHEST - 2 VIEW COMPARISON:  March 27, 2022. FINDINGS: The heart size and mediastinal contours are within normal limits. Moderate size right pleural effusion is noted with associated atelectasis. Left lung is clear. Status post surgical posterior fusion of  midthoracic spine is noted with old midthoracic fracture. IMPRESSION: Moderate size right pleural effusion is noted with associated atelectasis. Electronically Signed   By: Marijo Conception M.D.   On: 07/04/2022 16:02      Subjective: Patient seen and examined at bedside.  Extremely hard of hearing.  Poor historian.  No fever, agitation, vomiting reported.  Discharge Exam: Vitals:   07/08/22 0448 07/08/22 0700  BP: 115/64 126/68  Pulse: 94 94  Resp: 16 18  Temp: 98.8 F (37.1 C) (!) 97.3 F (36.3 C)  SpO2: 97% 97%    General: Pt is awake, extremely hard of hearing, slow to respond, poor historian.  Currently on room air.  Looks chronically ill and deconditioned. Cardiovascular: rate controlled, S1/S2 + Respiratory: bilateral decreased breath sounds at bases with some scattered crackles Abdominal: Soft, NT, ND, bowel sounds + Extremities: Trace lower extremity edema; no cyanosis    The results of significant diagnostics from this hospitalization (including imaging, microbiology, ancillary and laboratory) are listed below for reference.     Microbiology: Recent Results (from the past 240 hour(s))  Urine Culture     Status: Abnormal   Collection Time: 07/04/22  2:56 PM   Specimen: Urine, Clean Catch  Result Value Ref Range Status   Specimen Description URINE, CLEAN CATCH  Final   Special Requests   Final    NONE Performed at [facility], [address] [place], [place] [address]    Culture >=100,000 COLONIES/mL PROTEUS MIRABILIS (A)  Final   Report Status 07/06/2022 FINAL  Final   Organism ID, Bacteria PROTEUS MIRABILIS (A)  Final      Susceptibility   Proteus mirabilis - MIC*    AMPICILLIN <=2 SENSITIVE Sensitive     CEFAZOLIN <=4 SENSITIVE Sensitive     CEFEPIME <=0.12 SENSITIVE Sensitive     CEFTRIAXONE <=0.25 SENSITIVE Sensitive     CIPROFLOXACIN <=0.25 SENSITIVE Sensitive     GENTAMICIN <=1 SENSITIVE Sensitive     IMIPENEM 1 SENSITIVE Sensitive      NITROFURANTOIN 128 RESISTANT Resistant     TRIMETH/SULFA <=20 SENSITIVE Sensitive     AMPICILLIN/SULBACTAM <=2 SENSITIVE Sensitive     PIP/TAZO <=4 SENSITIVE Sensitive     * >=100,000 COLONIES/mL PROTEUS MIRABILIS  Culture, body fluid w Gram Stain-bottle     Status: None (Preliminary result)   Collection Time: 07/07/22 10:24 AM   Specimen: Pleura  Result Value Ref Range Status   Specimen Description PLEURAL  Final   Special Requests NONE  Final   Culture   Final    NO GROWTH < 24 HOURS Performed at [facility], [address] [place], [place] [address]    Report Status PENDING  Incomplete  Gram stain     Status: None   Collection Time: 07/07/22 10:24 AM   Specimen: Pleura  Result Value Ref Range Status   Specimen Description PLEURAL  Final   Special Requests NONE  Final   Gram  Stain   Final    NO WBC SEEN NO ORGANISMS SEEN Performed at [facility], [address], [place], [place] [address]    Report Status 07/07/2022 FINAL  Final     Labs: BNP (last 3 results) No results for input(s): "BNP" in the last 8760 hours. Basic Metabolic Panel: Recent Labs  Lab 07/04/22 1448 07/05/22 0349 07/06/22 0301  NA 143 141 140  K 4.2 3.7 3.5  CL 106 106 106  CO2 _0 GLUCOSE 96 105* 92  BUN 31* 28* 26*  CREATININE 1.30* 1.18 1.08  CALCIUM 9.6 9.1 8.6*  MG  --   --  2.0   Liver Function Tests: Recent Labs  Lab 07/04/22 1448  AST 33  ALT 30  ALKPHOS 82  BILITOT 0.5  PROT 6.7  ALBUMIN 3.1*   No results for input(s): "LIPASE", "AMYLASE" in the last 168 hours. No results for input(s): "AMMONIA" in the last 168 hours. CBC: Recent Labs  Lab 07/04/22 1448 07/05/22 0349 07/06/22 0301  WBC 7.6 7.7 6.1  NEUTROABS 6.0  --  4.4  HGB 11.2* 11.0* 11.1*  HCT 35.5* 33.5* 33.0*  MCV 88.3 84.6 83.8  PLT 256 232 237   Cardiac Enzymes: No results for input(s): "CKTOTAL", "CKMB", "CKMBINDEX", "TROPONINI" in the last 168 hours. BNP: Invalid input(s):  "POCBNP" CBG: No results for input(s): "GLUCAP" in the last 168 hours. D-Dimer No results for input(s): "DDIMER" in the last 72 hours. Hgb A1c No results for input(s): "HGBA1C" in the last 72 hours. Lipid Profile No results for input(s): "CHOL", "HDL", "LDLCALC", "TRIG", "CHOLHDL", "LDLDIRECT" in the last 72 hours. Thyroid function studies Recent Labs    07/06/22 0301  TSH 15.015*   Anemia work up Recent Labs    07/06/22 0301  VITAMINB12 568   Urinalysis    Component Value Date/Time   COLORURINE AMBER (A) 07/04/2022 1553   APPEARANCEUR CLOUDY (A) 07/04/2022 1553   LABSPEC 1.021 07/04/2022 1553   PHURINE 8.0 07/04/2022 1553   GLUCOSEU NEGATIVE 07/04/2022 1553   HGBUR NEGATIVE 07/04/2022 1553   BILIRUBINUR NEGATIVE 07/04/2022 1553   KETONESUR NEGATIVE 07/04/2022 1553   PROTEINUR 100 (A) 07/04/2022 1553   UROBILINOGEN 0.2 02/10/2015 2355   NITRITE NEGATIVE 07/04/2022 1553   LEUKOCYTESUR MODERATE (A) 07/04/2022 1553   Sepsis Labs Recent Labs  Lab 07/04/22 1448 07/05/22 0349 07/06/22 0301  WBC 7.6 7.7 6.1   Microbiology Recent Results (from the past 240 hour(s))  Urine Culture     Status: Abnormal   Collection Time: 07/04/22  2:56 PM   Specimen: Urine, Clean Catch  Result Value Ref Range Status   Specimen Description URINE, CLEAN CATCH  Final   Special Requests   Final    NONE Performed at [facility], [place] [address], [place], [place] [address]    Culture >=100,000 COLONIES/mL PROTEUS MIRABILIS (A)  Final   Report Status 07/06/2022 FINAL  Final   Organism ID, Bacteria PROTEUS MIRABILIS (A)  Final      Susceptibility   Proteus mirabilis - MIC*    AMPICILLIN <=2 SENSITIVE Sensitive     CEFAZOLIN <=4 SENSITIVE Sensitive     CEFEPIME <=0.12 SENSITIVE Sensitive     CEFTRIAXONE <=0.25 SENSITIVE Sensitive     CIPROFLOXACIN <=0.25 SENSITIVE Sensitive     GENTAMICIN <=1 SENSITIVE Sensitive     IMIPENEM 1 SENSITIVE Sensitive     NITROFURANTOIN 128 RESISTANT  Resistant     TRIMETH/SULFA <=20 SENSITIVE  Sensitive     AMPICILLIN/SULBACTAM <=2 SENSITIVE Sensitive     PIP/TAZO <=4 SENSITIVE Sensitive     * >=100,000 COLONIES/mL PROTEUS MIRABILIS  Culture, body fluid w Gram Stain-bottle     Status: None (Preliminary result)   Collection Time: 07/07/22 10:24 AM   Specimen: Pleura  Result Value Ref Range Status   Specimen Description PLEURAL  Final   Special Requests NONE  Final   Culture   Final    NO GROWTH < 24 HOURS Performed at [facility], [place] [address], [place], [place] [address]    Report Status PENDING  Incomplete  Gram stain     Status: None   Collection Time: 07/07/22 10:24 AM   Specimen: Pleura  Result Value Ref Range Status   Specimen Description PLEURAL  Final   Special Requests NONE  Final   Gram Stain   Final    NO WBC SEEN NO ORGANISMS SEEN Performed at [facility], [address] [place], [place] [address]    Report Status 07/07/2022 FINAL  Final     Time coordinating discharge: 35 minutes  SIGNED:   Aline August, MD  [facility] 07/08/2022, 10:29 AM

## 2022-07-08 NOTE — Plan of Care (Signed)
Pt A&OX3, D/c to SNF. Called report to SNF. BP 126/68 (BP Location: Right Arm)   Pulse 94   Temp (!) 97.3 F (36.3 C) (Oral)   Resp 18   Wt 65.1 kg   SpO2 97%   BMI 21.19 kg/m

## 2022-07-08 NOTE — TOC Transition Note (Signed)
Transition of Care [facility]) - CM/SW Discharge Note   Patient Details  Name: Jeremiah Owens MRN: [id number] Date of Birth: 02-16-1930  [facility] CM/SW Contact:  Curlene Labrum, RN Phone Number: 07/08/2022, 11:27 AM   Clinical Narrative:    CM met with the patient at the bedside to update him that he will be discharged to [facility] SNF today.  The patient's wife is not present at the bedside at this time.  I called and left a voicemail message on her home phone and cell phone regarding his transfer to the facility today.  I called [place], [place] at [facility] and she has an open bed available for the patient today.  PTAR will be arranged for transport to the facility.  Discharge summary and SNF Transfer report uploaded in the hub.  PTAR packet will be placed at the secretary's station to be sent with the patient.  Bedside nursing - please call report to [facility] SNF at [phone].   Final next level of care: Skilled Nursing Facility Barriers to Discharge: Continued Medical Work up   Patient Goals and CMS Choice Patient states their goals for this hospitalization and ongoing recovery are:: Patient is agreeable to SNF placement CMS Medicare.gov Compare Post Acute Care list provided to:: Patient Choice offered to / list presented to : Patient  Discharge Placement                       Discharge Plan and Services In-house Referral: Clinical Social Work Discharge Planning Services: CM Consult Post Acute Care Choice: Skilled Nursing Facility                               Social Determinants of Health (SDOH) Interventions     Readmission Risk Interventions     No data to display

## 2022-07-09 DIAGNOSIS — C9000 Multiple myeloma not having achieved remission: Secondary | ICD-10-CM | POA: Diagnosis not present

## 2022-07-09 DIAGNOSIS — E039 Hypothyroidism, unspecified: Secondary | ICD-10-CM | POA: Diagnosis not present

## 2022-07-09 DIAGNOSIS — D492 Neoplasm of unspecified behavior of bone, soft tissue, and skin: Secondary | ICD-10-CM | POA: Diagnosis not present

## 2022-07-09 DIAGNOSIS — C7951 Secondary malignant neoplasm of bone: Secondary | ICD-10-CM | POA: Diagnosis not present

## 2022-07-09 LAB — CYTOLOGY - NON PAP

## 2022-07-10 ENCOUNTER — Encounter: Payer: Self-pay | Admitting: Internal Medicine

## 2022-07-10 DIAGNOSIS — J90 Pleural effusion, not elsewhere classified: Secondary | ICD-10-CM | POA: Diagnosis not present

## 2022-07-10 DIAGNOSIS — N3000 Acute cystitis without hematuria: Secondary | ICD-10-CM | POA: Diagnosis not present

## 2022-07-10 DIAGNOSIS — N1831 Chronic kidney disease, stage 3a: Secondary | ICD-10-CM | POA: Diagnosis not present

## 2022-07-12 LAB — CULTURE, BODY FLUID W GRAM STAIN -BOTTLE: Culture: NO GROWTH

## 2022-07-14 DIAGNOSIS — R0689 Other abnormalities of breathing: Secondary | ICD-10-CM | POA: Diagnosis not present

## 2022-07-14 DIAGNOSIS — Z8709 Personal history of other diseases of the respiratory system: Secondary | ICD-10-CM | POA: Diagnosis not present

## 2022-07-16 DIAGNOSIS — J90 Pleural effusion, not elsewhere classified: Secondary | ICD-10-CM | POA: Diagnosis not present

## 2022-07-16 DIAGNOSIS — N182 Chronic kidney disease, stage 2 (mild): Secondary | ICD-10-CM | POA: Diagnosis not present

## 2022-07-16 DIAGNOSIS — D649 Anemia, unspecified: Secondary | ICD-10-CM | POA: Diagnosis not present

## 2022-07-18 ENCOUNTER — Encounter: Payer: Self-pay | Admitting: Family Medicine

## 2022-07-18 ENCOUNTER — Non-Acute Institutional Stay: Payer: Medicare Other | Admitting: Family Medicine

## 2022-07-18 VITALS — BP 106/62 | HR 65 | Temp 97.7°F | Resp 16

## 2022-07-18 DIAGNOSIS — K5900 Constipation, unspecified: Secondary | ICD-10-CM

## 2022-07-18 DIAGNOSIS — Z515 Encounter for palliative care: Secondary | ICD-10-CM | POA: Diagnosis not present

## 2022-07-18 DIAGNOSIS — D492 Neoplasm of unspecified behavior of bone, soft tissue, and skin: Secondary | ICD-10-CM

## 2022-07-18 NOTE — Progress Notes (Signed)
Jeremiah Owens Consult Note Telephone: [phone]  Fax: [phone]   Date of encounter: 07/18/22 10:26 AM PATIENT NAME: Jeremiah Owens [employer] [place] [id number]   [phone] (home)  DOB: 09/16/1929 MRN: [id number] PRIMARY CARE PROVIDER:    Wenda Low, MD,  [place] [employer] [facility] [employer] [facility] [address] [address] [phone]  REFERRING PROVIDER:   Wenda Low, MD [place] [employer] [facility] [address],  [place] [address] [phone]  RESPONSIBLE PARTY:    Contact Information     Name Relation Home Work [place] Spouse [phone]  [phone]   Jeremiah Owens, Jeremiah Owens   [phone]        I met face to face with patient in [place]. Palliative Care was asked to follow this patient by consultation request of  Jeremiah Low, MD to address advance care planning and complex medical decision making. This is the initial visit.          ASSESSMENT, SYMPTOM MANAGEMENT AND PLAN / RECOMMENDATIONS:   Multiple myeloma with metastasis to bone and spinal column Previously had radiation of cancer in 2016. Has stroke with residual deficit Agree with PT/OT/ST to help maximize function  2.  Constipation Encourage use of Senna 2-4 tabs daily particularly while on pain meds.  Can add Miralax 17 gm daily prn and either fleets enema prn if more than 2 days since BM  3.  Palliative Care Encounter Assess family's understanding of disease process and wishes for continued treatment versus comfort care and choice for setting of care.   Follow up Palliative Care Visit: Palliative care will continue to follow for complex medical decision making, advance care planning, and clarification of goals. Return 2 weeks or prn.    This visit was coded based on medical decision making (MDM).  PPS: 40%  HOSPICE ELIGIBILITY/DIAGNOSIS: TBD  Chief Complaint:  Jeremiah Owens received a  referral to follow up with patient for chronic disease management in setting of multiple myeloma metastatic to spinal column and bone.  Palliative care is also following to assist with advance directive planning and defining/refining goals of care.   HISTORY OF PRESENT ILLNESS:  Jeremiah Owens is a 88 y.o. year old male with multiple myeloma with metastasis to spinal column, hx of CVA with residual right leg weakness and dysphagia/aphasia/dysarthria, gait disorder, aortic stenosis, constipation and hx of aspiration pneumonia and pleural effusion on the right side.  He also has hx of prostate cancer, stage 3 CKD, hypothyroidism and anemia. He has previously had radiation in 2016.  Patient was seen sitting in his wc today and he was just finishing up working with OT who said he was max assist for dressing upper body and set up to brush his teeth.  He expressed interest in going to the music program at the facility when we are done.  Denies pain, SOB, recent fall, nausea or vomiting.  Appetite is fair.  He was recently d/c'd from the hospital after being treated for a UTI from 12/1-23-07/08/22.  Urine culture positive for Proteus mirabilis.   He was also incidentally noted to have a right pleural effusion for which he underwent thoracentesis and had 600 cc fluid removed and sent to pathology.  He was recommended to follow up with Pulmonology and PCP and to go to SNF with PT/OT/ST.  His lasix was held and he was to resume at the SNF.  History obtained from review of EMR, discussion with primary team, and  interview with family, facility staff/caregiver and/or Jeremiah Owens.   03/28/22:  CT Chest ABD and Pelvis with contrast: IMPRESSION: 1. Nondisplaced acute right 9th rib fracture. Small to moderate right pleural effusion with compressive atelectasis. 2. Mildly displaced right lumbar transverse process fractures L1 through L3. 3. No other acute traumatic injury identified in the chest, abdomen, or pelvis. 4.  Chronic T5 vertebra plana with pronounced thoracic kyphosis, previous posterior decompression, and posterior fusion from T4 through T7. 5. Bronchiectasis. Aortic Atherosclerosis (ICD10-I70.0).     Latest Ref Rng & Units 07/06/2022    3:01 AM 07/05/2022    3:49 AM 07/04/2022    2:48 PM  CBC  WBC 4.0 - 10.5 K/uL 6.1  7.7  7.6   Hemoglobin 13.0 - 17.0 g/dL 11.1  11.0  11.2   Hematocrit 39.0 - 52.0 % 33.0  33.5  35.5   Platelets 150 - 400 K/uL 237  232  256        Latest Ref Rng & Units 07/06/2022    3:01 AM 07/05/2022    3:49 AM 07/04/2022    2:48 PM  CMP  Glucose 70 - 99 mg/dL 92  105  96   BUN 8 - 23 mg/dL _0 Creatinine 0.61 - 1.24 mg/dL 1.08  1.18  1.30   Sodium 135 - 145 mmol/L 140  141  143   Potassium 3.5 - 5.1 mmol/L 3.5  3.7  4.2   Chloride 98 - 111 mmol/L 106  106  106   CO2 22 - 32 mmol/L _1 Calcium 8.9 - 10.3 mg/dL 8.6  9.1  9.6   Total Protein 6.5 - 8.1 g/dL   6.7   Total Bilirubin 0.3 - 1.2 mg/dL   0.5   Alkaline Phos 38 - 126 U/L   82   AST 15 - 41 U/L   33   ALT 0 - 44 U/L   30        Latest Ref Rng & Units 07/04/2022    2:48 PM 04/16/2022   10:36 AM 01/23/2022    9:27 AM  Hepatic Function  Total Protein 6.5 - 8.1 g/dL 6.7  6.9  6.9   Albumin 3.5 - 5.0 g/dL 3.1  4.0  3.9   AST 15 - 41 U/L 33  24  20   ALT 0 - 44 U/L _2 Alk Phosphatase 38 - 126 U/L 82  96  69   Total Bilirubin 0.3 - 1.2 mg/dL 0.5  0.5  0.4    07/04/22 Urine culture:  omponent 3 wk ago  Specimen Description URINE, CLEAN CATCH  Special Requests NONE Performed at [facility], [address] [place],  [address]  Culture >=100,000 COLONIES/mL PROTEUS MIRABILIS Abnormal   Report Status 07/06/2022 FINAL  Organism ID, Bacteria PROTEUS MIRABILIS Abnormal   Resulting Agency [facility]     Susceptibility   Proteus mirabilis    MIC    AMPICILLIN <=2 SENSITIVE Sensitive    AMPICILLIN/SULBACTAM <=2 SENSITIVE Sensitive    CEFAZOLIN <=4 SENSITIVE  Sensitive    CEFEPIME <=0.12 SENS... Sensitive    CEFTRIAXONE <=0.25 SENS... Sensitive    CIPROFLOXACIN <=0.25 SENS... Sensitive    GENTAMICIN <=1 SENSITIVE Sensitive    IMIPENEM 1 SENSITIVE Sensitive    NITROFURANTOIN 128 RESISTANT Resistant    PIP/TAZO <=4 SENSITIVE Sensitive    TRIMETH/SULFA <=20 SENSIT... Sensitive  07/06/22 TSH elevated at 15.015  07/07/22 EKG showing SR with frequent PVCs and possible RVH.  07/07/22 CXR s/p thoracentesis: IMPRESSION: Interval decrease in right pleural effusion with persistent right base collapse/consolidation. No pneumothorax.  07/07/22 Pleural fluid cytology:  Non-Gynecological Cytology Report  Clinical History: None provided Specimen Submitted:  A. PLEURAL FLUID, RIGHT, THORACENTESIS:   FINAL MICROSCOPIC DIAGNOSIS: - No malignant cells identified  SPECIMEN ADEQUACY: Satisfactory for evaluation  GROSS: Received is/are 500cc's of cloudy dark red fluid.(TB:tb) Smears: 0 Concentration Method (Thin Prep):1 Cell Block: 1 Conventional Additional Studies: 2 hematology slides labeled [id number]     I reviewed EMR for available labs, medications, imaging, studies and related documents.  /Records reviewed and summarized above.   ROS-limited responses from pt, mostly nods yes or no General: NAD ENMT: endorses some dysphagia, more with dry foods Cardiovascular: denies chest pain, denies DOE Pulmonary: denies cough, denies increased SOB Abdomen: endorses fair appetite, denies constipation, endorses continence of bowel GU: denies dysuria, endorses continence of urine MSK:  endorses increased weakness, no falls reported Skin: denies rashes or wounds Neurological: denies pain, denies insomnia Psych: Endorses positive mood Heme/lymph/immuno: denies bruises, abnormal bleeding  Physical Exam: Current and past weights: 143 lbs 8.3 oz as of 07/08/22 Constitutional: NAD General: frail appearing, WD older male sitting in wc ENMT:  intact hearing, oral mucous membranes moist, dentition intact CV: S1S2, RRR, no LE edema Pulmonary: CTAB, no increased work of breathing, no cough, room air Abdomen: normo-active BS + 4 quadrants, soft and non tender, no ascites GU: deferred MSK: no sarcopenia, moves all extremities,  Skin: warm and dry, no rashes or wounds on visible skin Neuro:  noted generalized weakness, slight dysarthria  Psych: non-anxious affect, A and O x 2 Hem/lymph/immuno: no widespread bruising  CURRENT PROBLEM LIST:  Patient Active Problem List   Diagnosis Date Noted   Pleural effusion on right 07/07/2022   UTI (urinary tract infection) 07/04/2022   Stage 3a chronic kidney disease (CKD) ([place]) 07/04/2022   Disorder of left rotator cuff 08/08/2020   Multiple myeloma not having achieved remission ([place]) 10/24/2015   Dysphagia as late effect of stroke 07/10/2015   Diarrhea 04/25/2015   Cancer associated pain 04/25/2015   Long term current use of anticoagulant therapy 04/25/2015   Basal ganglia infarction ([place]) 02/12/2015   Dizziness    Expressive aphasia    Past pointing    Right leg weakness    History of stroke 02/11/2015   Cerebral infarction ([place]) 02/11/2015   Aspiration pneumonia ([place]) 02/11/2015   Aphasia    Difficulty speaking    Speech difficult to understand    Encounter for antineoplastic chemotherapy 01/31/2015   Constipation 12/20/2014   Rash 12/20/2014   Peripheral edema 12/06/2014   Multifactorial gait disorder 09/05/2014   Multiple myeloma ([place])    Paraplegia ([place]) 06/21/2014   Postoperative anemia due to acute blood loss 06/21/2014   Neoplasm of thoracic spine 06/20/2014   Metastasis to spinal column ([place]) 06/17/2014   Thoracic spine tumor 06/17/2014   Metastatic bone cancer 06/15/2014   PAST MEDICAL HISTORY:  Active Ambulatory Problems    Diagnosis Date Noted   Metastatic bone cancer 06/15/2014   Metastasis to spinal column ([place]) 06/17/2014   Thoracic spine tumor 06/17/2014    Neoplasm of thoracic spine 06/20/2014   Paraplegia ([place]) 06/21/2014   Postoperative anemia due to acute blood loss 06/21/2014   Multiple myeloma ([facility])    Multifactorial gait disorder 09/05/2014   Peripheral edema 12/06/2014  Constipation 12/20/2014   Rash 12/20/2014   Encounter for antineoplastic chemotherapy 01/31/2015   History of stroke 02/11/2015   Cerebral infarction ([place]) 02/11/2015   Aspiration pneumonia ([place]) 02/11/2015   Aphasia    Difficulty speaking    Speech difficult to understand    Dizziness    Expressive aphasia    Past pointing    Right leg weakness    Basal ganglia infarction ([place]) 02/12/2015   Diarrhea 04/25/2015   Cancer associated pain 04/25/2015   Long term current use of anticoagulant therapy 04/25/2015   Dysphagia as late effect of stroke 07/10/2015   Multiple myeloma not having achieved remission ([place]) 10/24/2015   Disorder of left rotator cuff 08/08/2020   UTI (urinary tract infection) 07/04/2022   Stage 3a chronic kidney disease (CKD) ([place]) 07/04/2022   Pleural effusion on right 07/07/2022   Resolved Ambulatory Problems    Diagnosis Date Noted   Cellulitis 12/06/2014   Stroke [facility])    Past Medical History:  Diagnosis Date   Allergy    Anxiety    Bone cancer ([place])    Compression fracture    Hearing loss    Melanoma ([place])    Prostate cancer ([place]) 2002   S/P radiation therapy 08/21/14-09/04/14   S/P radiation therapy 11/29/14-12/12/14   Skin cancer 1994   Thyroid disease    SOCIAL HX:  Social History   Tobacco Use   Smoking status: Never   Smokeless tobacco: Never  Substance Use Topics   Alcohol use: No    Alcohol/week: 0.0 standard drinks of alcohol   FAMILY HX:  Family History  Problem Relation Age of Onset   Cancer Mother        breast   Stroke Mother        Preferred Pharmacy: ALLERGIES:  Allergies  Allergen Reactions   Iodine Itching and Rash    Occurred >20 years ago to topical preporation.  Tolerated IV contrast  without issue.      PERTINENT MEDICATIONS:  Outpatient Encounter Medications as of 07/18/2022  Medication Sig   aspirin 81 MG EC tablet Take 81 mg by mouth daily.   Calcium Carbonate-Vitamin D (CALTRATE 600+D PO) Take 600 mg by mouth 2 (two) times daily.   cholecalciferol (VITAMIN D) 1000 UNITS tablet Take 1,000 Units by mouth at bedtime.    diclofenac Sodium (VOLTAREN) 1 % GEL Apply 2 g topically daily as needed (for body pain).   furosemide (LASIX) 20 MG tablet Take 20-40 mg by mouth See admin instructions. Take 2 tablets by mouth every Monday and Thursday, then take 1 tablet all other days   hydrocortisone 2.5 % lotion Apply 1 application topically at bedtime as needed.   KLOR-CON 10 10 MEQ tablet Take 10 mEq by mouth daily.   levothyroxine (SYNTHROID) 125 MCG tablet 1 tablet in the morning on an empty stomach   Multiple Vitamins-Minerals (CENTRUM SILVER PO) Take 1 tablet by mouth daily.   omeprazole (PRILOSEC) 40 MG capsule Take 40 mg by mouth daily as needed (heartburn).    saccharomyces boulardii (FLORASTOR) 250 MG capsule Take 250 mg by mouth daily.   tamsulosin (FLOMAX) 0.4 MG CAPS capsule Take 1 capsule (0.4 mg total) by mouth at bedtime.   TEXACORT 2.5 % SOLN Apply 1 Application topically daily as needed (for scalp).   Facility-Administered Encounter Medications as of 07/18/2022  Medication   sodium chloride 0.9 % injection 10 mL     ------------------------------------------------------------------------------------------ Advance Care Planning/Goals of Care: Goals include to  maximize quality of life and symptom management.  Review of an existing advance directive document-MOST .  CODE STATUS: MOST as of 07/09/21: Has a faint X over Do Not attempt resuscitation but a check mark through attempt CPR Check marks are also on: Full scope of treatment Antibiotics and IV fluids if indicated. There is not indication of a decision about feeding tubes and the document is signed by  pt himself   Thank you for the opportunity to participate in the care of Mr. Wahlert.  The palliative care team will continue to follow. Please call our office at [phone] if we can be of additional assistance.   Marijo Conception, FNP-C  COVID-19 PATIENT SCREENING TOOL Asked and negative response unless otherwise noted:  Have you had symptoms of covid, tested positive or been in contact with someone with symptoms/positive test in the past 5-10 days? Unknown

## 2022-07-23 ENCOUNTER — Other Ambulatory Visit: Payer: Self-pay

## 2022-07-23 ENCOUNTER — Inpatient Hospital Stay: Payer: No Typology Code available for payment source | Attending: Internal Medicine

## 2022-07-23 VITALS — BP 121/80 | HR 90 | Temp 97.7°F | Resp 18

## 2022-07-23 DIAGNOSIS — R5383 Other fatigue: Secondary | ICD-10-CM | POA: Insufficient documentation

## 2022-07-23 DIAGNOSIS — Z8673 Personal history of transient ischemic attack (TIA), and cerebral infarction without residual deficits: Secondary | ICD-10-CM | POA: Insufficient documentation

## 2022-07-23 DIAGNOSIS — C7951 Secondary malignant neoplasm of bone: Secondary | ICD-10-CM | POA: Diagnosis not present

## 2022-07-23 DIAGNOSIS — Z85828 Personal history of other malignant neoplasm of skin: Secondary | ICD-10-CM | POA: Insufficient documentation

## 2022-07-23 DIAGNOSIS — C9000 Multiple myeloma not having achieved remission: Secondary | ICD-10-CM | POA: Diagnosis present

## 2022-07-23 DIAGNOSIS — Z923 Personal history of irradiation: Secondary | ICD-10-CM | POA: Diagnosis not present

## 2022-07-23 DIAGNOSIS — Z8546 Personal history of malignant neoplasm of prostate: Secondary | ICD-10-CM | POA: Insufficient documentation

## 2022-07-23 DIAGNOSIS — Z85820 Personal history of malignant melanoma of skin: Secondary | ICD-10-CM | POA: Insufficient documentation

## 2022-07-23 MED ORDER — SODIUM CHLORIDE 0.9 % IV SOLN: INTRAVENOUS | Status: DC

## 2022-07-23 MED ORDER — ZOLEDRONIC ACID 4 MG/100ML IV SOLN
4.0000 mg | Freq: Once | INTRAVENOUS | Status: AC
  Administered 2022-07-23: 4 mg via INTRAVENOUS
  Filled 2022-07-23: qty 100

## 2022-07-23 NOTE — Patient Instructions (Signed)

## 2022-07-25 DIAGNOSIS — Z515 Encounter for palliative care: Secondary | ICD-10-CM | POA: Insufficient documentation

## 2022-07-31 ENCOUNTER — Encounter: Payer: Self-pay | Admitting: Internal Medicine

## 2022-08-07 ENCOUNTER — Encounter: Payer: Self-pay | Admitting: Pulmonary Disease

## 2022-08-07 ENCOUNTER — Ambulatory Visit (INDEPENDENT_AMBULATORY_CARE_PROVIDER_SITE_OTHER): Payer: Medicare Other

## 2022-08-07 ENCOUNTER — Ambulatory Visit (INDEPENDENT_AMBULATORY_CARE_PROVIDER_SITE_OTHER): Payer: Medicare Other | Admitting: Pulmonary Disease

## 2022-08-07 VITALS — BP 98/62 | HR 90 | Temp 98.8°F | Ht 69.0 in | Wt 143.0 lb

## 2022-08-07 DIAGNOSIS — E440 Moderate protein-calorie malnutrition: Secondary | ICD-10-CM

## 2022-08-07 DIAGNOSIS — Z8673 Personal history of transient ischemic attack (TIA), and cerebral infarction without residual deficits: Secondary | ICD-10-CM

## 2022-08-07 DIAGNOSIS — J90 Pleural effusion, not elsewhere classified: Secondary | ICD-10-CM | POA: Diagnosis not present

## 2022-08-07 DIAGNOSIS — R627 Adult failure to thrive: Secondary | ICD-10-CM

## 2022-08-07 DIAGNOSIS — J690 Pneumonitis due to inhalation of food and vomit: Secondary | ICD-10-CM | POA: Diagnosis not present

## 2022-08-07 DIAGNOSIS — R918 Other nonspecific abnormal finding of lung field: Secondary | ICD-10-CM | POA: Diagnosis not present

## 2022-08-07 NOTE — Progress Notes (Signed)
Synopsis: Referred in January 2024 for pleural effusion identified when he was hospitalized for generalized weakness and a urinary tract infection in December 2023.  He has a history of aortic stenosis, multiple myeloma.  Subjective:   PATIENT ID: Jeremiah Owens GENDER: male DOB: 1929-10-03, MRN: [id number]   HPI  Chief Complaint  Patient presents with   [facility] sent here for referral    Jeremiah Owens was referred to see me because he was hospitalized recently in the context of a urinary tract infection and generalized weakness and was found to have a left-sided pleural effusion.  That effusion was sampled, noted to be lymphocyte predominant, cytology negative, cultures negative.  Since that hospitalization he was transferred to [facility] rehab where he continues to participate in rehab.  He had a repeat swallow evaluation which showed that he likely aspirates thin liquids.  He is still using a diced, moistened food for solid regimen and nectar thick liquids.  His wife provides the history today.  She says that he has been short of breath for most of December and this has not changed recently.  He can only walk about 45 feet.  He does eat as detailed above.  No recent increase in cough or mucus production.  No recent fevers or chills.  We talked extensively today about his overall condition.  Record review: The patient was hospitalized in December 2023 in the hospital discharge summary and notes are reviewed.  The patient was hospitalized in the setting of fatigue, urinary tract infection from [place] which was treated successfully with antibiotics.  He had some improvement was discharged to a nursing home.  During that visit pulmonary medicine was consulted for right-sided pleural effusion.  Interventional radiology performed a thoracentesis and removed 600 cc of fluid. Speech therapy saw him and recommended continuing nectar thick liquids.  Past Medical History:  Diagnosis Date    Allergy    Anxiety    Bone cancer ([place])    t_spine T-5   Compression fracture    Encounter for antineoplastic chemotherapy 01/31/2015   Hearing loss    Melanoma ([place])    Multiple myeloma ([place])    Prostate cancer ([place]) 2002   S/P radiation therapy 08/21/14-09/04/14   T4-6 25Gy/59f   S/P radiation therapy 11/29/14-12/12/14   rt prox humerus/shoulder/scapula 25Gy/13f  Skin cancer 1994   melanoma  right neck    Stroke ([place]   Thyroid disease      Family History  Problem Relation Age of Onset   Cancer Mother        breast   Stroke Mother      Social History   Socioeconomic History   Marital status: Married    Spouse name: Not on file   Number of children: Not on file   Years of education: Not on file   Highest education level: Not on file  Occupational History   Not on file  Tobacco Use   Smoking status: Never   Smokeless tobacco: Never  Vaping Use   Vaping Use: Never used  Substance and Sexual Activity   Alcohol use: No    Alcohol/week: 0.0 standard drinks of alcohol   Drug use: No   Sexual activity: Not on file  Other Topics Concern   Not on file  Social History Narrative   Not on file   Social Determinants of Health   Financial Resource Strain: Not on file  Food Insecurity: No Food Insecurity (07/07/2022)  Hunger Vital Sign    Worried About Charity fundraiser in the Last Year: Never true    Ran Out of Food in the Last Year: Never true  Transportation Needs: Not on file  Physical Activity: Not on file  Stress: Not on file  Social Connections: Not on file  Intimate Partner Violence: Not At Risk (07/05/2022)   Humiliation, Afraid, Rape, and Kick questionnaire    Fear of Current or Ex-Partner: No    Emotionally Abused: No    Physically Abused: No    Sexually Abused: No     Allergies  Allergen Reactions   Iodine Itching and Rash    Occurred >20 years ago to topical preporation.  Tolerated IV contrast without issue.      Outpatient Medications Prior  to Visit  Medication Sig Dispense Refill   aspirin 81 MG EC tablet Take 81 mg by mouth daily.  0   Calcium Carbonate-Vitamin D (CALTRATE 600+D PO) Take 600 mg by mouth 2 (two) times daily.     cholecalciferol (VITAMIN D) 1000 UNITS tablet Take 1,000 Units by mouth at bedtime.      diclofenac Sodium (VOLTAREN) 1 % GEL Apply 2 g topically daily as needed (for body pain).     furosemide (LASIX) 20 MG tablet Take 20-40 mg by mouth See admin instructions. Take 2 tablets by mouth every Monday and Thursday, then take 1 tablet all other days     hydrocortisone 2.5 % lotion Apply 1 application topically at bedtime as needed.     KLOR-CON 10 10 MEQ tablet Take 10 mEq by mouth daily.     levothyroxine (SYNTHROID) 125 MCG tablet 1 tablet in the morning on an empty stomach     Multiple Vitamins-Minerals (CENTRUM SILVER PO) Take 1 tablet by mouth daily.     omeprazole (PRILOSEC) 40 MG capsule Take 40 mg by mouth daily as needed (heartburn).      saccharomyces boulardii (FLORASTOR) 250 MG capsule Take 250 mg by mouth daily.     tamsulosin (FLOMAX) 0.4 MG CAPS capsule Take 1 capsule (0.4 mg total) by mouth at bedtime. 30 capsule 3   TEXACORT 2.5 % SOLN Apply 1 Application topically daily as needed (for scalp).  2   Facility-Administered Medications Prior to Visit  Medication Dose Route Frequency Provider Last Rate Last Admin   sodium chloride 0.9 % injection 10 mL  10 mL Intracatheter PRN Curt Bears, MD        Review of Systems  Constitutional:  Positive for malaise/fatigue. Negative for chills, fever and weight loss.  HENT:  Negative for congestion, nosebleeds, sinus pain and sore throat.   Eyes:  Negative for photophobia, pain and discharge.  Respiratory:  Positive for shortness of breath. Negative for cough, hemoptysis, sputum production and wheezing.   Cardiovascular:  Negative for chest pain, palpitations, orthopnea and leg swelling.  Gastrointestinal:  Negative for abdominal pain,  constipation, diarrhea, nausea and vomiting.  Genitourinary:  Negative for dysuria, frequency, hematuria and urgency.  Musculoskeletal:  Negative for back pain, joint pain, myalgias and neck pain.  Skin:  Negative for itching and rash.  Neurological:  Negative for tingling, tremors, sensory change, speech change, focal weakness, seizures, weakness and headaches.  Psychiatric/Behavioral:  Negative for memory loss, substance abuse and suicidal ideas. The patient is not nervous/anxious.       Objective:  Physical Exam   Vitals:   08/07/22 1036  BP: 98/62  Pulse: 90  Temp: 98.8 F (37.1 C)  TempSrc: Oral  SpO2: 97%  Weight: 143 lb (64.9 kg)  Height: _0  (1.753 m)    Gen: chronically ill appearing, in wheelchair HENT: NCAT, OP clear, neck supple without masses Eyes: PERRL, EOMi Lymph: no cervical lymphadenopathy PULM: Diminished R base, clear on left CV: RRR, harsh systolic murmur LUSB, no JVD GI: BS+, soft, nontender, no hsm Derm: dry, flaking skin without breakdown MSK: diminished bulk and tone Neuro: awake, slow to speak but able to converse with me when prompted Psyche: normal mood and affect    CBC    Component Value Date/Time   WBC 6.1 07/06/2022 0301   RBC 3.94 (L) 07/06/2022 0301   HGB 11.1 (L) 07/06/2022 0301   HGB 11.8 (L) 04/16/2022 1036   HGB 13.4 07/15/2017 0949   HCT 33.0 (L) 07/06/2022 0301   HCT 40.6 07/15/2017 0949   PLT 237 07/06/2022 0301   PLT 205 04/16/2022 1036   PLT 121 (L) 07/15/2017 0949   MCV 83.8 07/06/2022 0301   MCV 97.4 07/15/2017 0949   MCH 28.2 07/06/2022 0301   MCHC 33.6 07/06/2022 0301   RDW 16.1 (H) 07/06/2022 0301   RDW 13.9 07/15/2017 0949   LYMPHSABS 1.0 07/06/2022 0301   LYMPHSABS 0.7 (L) 07/15/2017 0949   MONOABS 0.5 07/06/2022 0301   MONOABS 0.3 07/15/2017 0949   EOSABS 0.2 07/06/2022 0301   EOSABS 0.1 07/15/2017 0949   BASOSABS 0.0 07/06/2022 0301   BASOSABS 0.0 07/15/2017 0949     Chest imaging: August 2023  CT chest abdomen pelvis independently reviewed showing bronchiectasis in the left base, compressive atelectasis right base and a pleural effusion on the right, some nonspecific interstitial changes noted, not clearly fibrosis.  PFT:  Labs: July 07, 2022 left pleural effusion cytology negative for malignancy, exudative, lymphocyte predominant  Path:  Echo:  Heart Catheterization:       Assessment & Plan:   Pleural effusion on right - Plan: DG Chest 2 View  History of stroke  Aspiration pneumonia of right lower lobe, unspecified aspiration pneumonia type ([place])  Failure to thrive in adult  Moderate protein-calorie malnutrition ([place])  Discussion: Jeremiah Owens has a chronic right lower lobe effusion which developed after a fall, this may be due to the fall, prior lung contusion and ongoing aspiration.  It has been present since August and was notable again in December.  This will not go away.  He has trapped lung.  Fortunately he does not have evidence of a malignant or infected pleural effusion.  I explained this to his wife in detail today.  Jeremiah Owens has severe protein calorie malnutrition and severe physical deconditioning.  His overall condition has been declining steadily over the last several months.  Today he and his wife and I had a frank conversation about goals of care.  We talked about CODE STATUS, he agreed to a DNR status.  I recommend palliative care consultation and home hospice.  His wife voiced understanding and was thankful for the conversation as she has felt this is the direction think should be going and based on his progressive decline.  Plan: Right pleural effusion: Repeat chest x-ray today As we discussed today there is very little role for any further intervention  CODE STATUS: MOST form completed today DO NOT RESUSCITATE Antibiotics, IV fluids okay, no feeding tube Recommend palliative care consultation Recommend home hospice  Follow-up with me in 6  weeks.  Immunizations: Immunization History  Administered Date(s) Administered   Fluad Quad(high Dose 65+) 04/27/2019  Influenza Split 05/08/2014   Influenza Whole 05/19/2002   Influenza, High Dose Seasonal PF 06/15/2009, 05/08/2010, 05/14/2016, 05/04/2017   Influenza,inj,Quad PF,6+ Mos 05/09/2015   Influenza-Unspecified 07/02/2011, 06/07/2012, 05/22/2016, 05/21/2018, 04/23/2019   PFIZER(Purple Top)SARS-COV-2 Vaccination 09/15/2019   Pneumococcal Conjugate-13 04/21/2014   Pneumococcal-Unspecified 05/04/2004, 05/13/2014   Tdap 04/04/2010, 03/04/2020, 03/29/2020     Current Outpatient Medications:    aspirin 81 MG EC tablet, Take 81 mg by mouth daily., Disp: , Rfl: 0   Calcium Carbonate-Vitamin D (CALTRATE 600+D PO), Take 600 mg by mouth 2 (two) times daily., Disp: , Rfl:    cholecalciferol (VITAMIN D) 1000 UNITS tablet, Take 1,000 Units by mouth at bedtime. , Disp: , Rfl:    diclofenac Sodium (VOLTAREN) 1 % GEL, Apply 2 g topically daily as needed (for body pain)., Disp: , Rfl:    furosemide (LASIX) 20 MG tablet, Take 20-40 mg by mouth See admin instructions. Take 2 tablets by mouth every Monday and Thursday, then take 1 tablet all other days, Disp: , Rfl:    hydrocortisone 2.5 % lotion, Apply 1 application topically at bedtime as needed., Disp: , Rfl:    KLOR-CON 10 10 MEQ tablet, Take 10 mEq by mouth daily., Disp: , Rfl:    levothyroxine (SYNTHROID) 125 MCG tablet, 1 tablet in the morning on an empty stomach, Disp: , Rfl:    Multiple Vitamins-Minerals (CENTRUM SILVER PO), Take 1 tablet by mouth daily., Disp: , Rfl:    omeprazole (PRILOSEC) 40 MG capsule, Take 40 mg by mouth daily as needed (heartburn). , Disp: , Rfl:    saccharomyces boulardii (FLORASTOR) 250 MG capsule, Take 250 mg by mouth daily., Disp: , Rfl:    tamsulosin (FLOMAX) 0.4 MG CAPS capsule, Take 1 capsule (0.4 mg total) by mouth at bedtime., Disp: 30 capsule, Rfl: 3   TEXACORT 2.5 % SOLN, Apply 1 Application topically  daily as needed (for scalp)., Disp: , Rfl: 2 No current facility-administered medications for this visit.  Facility-Administered Medications Ordered in Other Visits:    sodium chloride 0.9 % injection 10 mL, 10 mL, Intracatheter, PRN, Curt Bears, MD

## 2022-08-07 NOTE — Patient Instructions (Signed)
Right pleural effusion: Repeat chest x-ray today As we discussed today there is very little role for any further intervention  CODE STATUS: MOST form completed today DO NOT RESUSCITATE Antibiotics, IV fluids okay, no feeding tube Recommend palliative care consultation Recommend home hospice  Follow-up with me in 6 weeks.

## 2022-08-12 DIAGNOSIS — R2232 Localized swelling, mass and lump, left upper limb: Secondary | ICD-10-CM

## 2022-08-20 DIAGNOSIS — M6281 Muscle weakness (generalized): Secondary | ICD-10-CM

## 2022-08-20 DIAGNOSIS — Z9181 History of falling: Secondary | ICD-10-CM

## 2022-08-20 DIAGNOSIS — R2689 Other abnormalities of gait and mobility: Secondary | ICD-10-CM

## 2022-08-21 ENCOUNTER — Encounter: Payer: Self-pay | Admitting: Internal Medicine

## 2022-08-21 ENCOUNTER — Non-Acute Institutional Stay: Payer: Medicare Other | Admitting: Family Medicine

## 2022-08-21 ENCOUNTER — Encounter: Payer: Self-pay | Admitting: Family Medicine

## 2022-08-21 VITALS — BP 124/72 | HR 90 | Temp 98.2°F | Resp 22

## 2022-08-21 DIAGNOSIS — J90 Pleural effusion, not elsewhere classified: Secondary | ICD-10-CM

## 2022-08-21 DIAGNOSIS — D492 Neoplasm of unspecified behavior of bone, soft tissue, and skin: Secondary | ICD-10-CM | POA: Diagnosis not present

## 2022-08-21 DIAGNOSIS — Z515 Encounter for palliative care: Secondary | ICD-10-CM | POA: Diagnosis not present

## 2022-08-21 DIAGNOSIS — I69391 Dysphagia following cerebral infarction: Secondary | ICD-10-CM

## 2022-08-21 DIAGNOSIS — C9000 Multiple myeloma not having achieved remission: Secondary | ICD-10-CM | POA: Diagnosis not present

## 2022-08-21 NOTE — Progress Notes (Signed)
[facility] Consult Note Telephone: [phone]  Fax: [phone]    Date of encounter: 08/21/22 10:37 AM PATIENT NAME: Jeremiah Owens [place] [id number]   [phone] (home)  DOB: 03/13/1930 MRN: [id number] PRIMARY CARE PROVIDER:    Wenda Low, MD,  [facility] [address] [place] [address] [phone]  REFERRING PROVIDER:   Wenda Low, MD [facility] [employer] [facility] [address],  [place] [address] [phone]  RESPONSIBLE PARTY:    Contact Information     Name Relation Home Work [place] Spouse [phone]  [phone]   Hersel, Mcmeen   [phone]        I met face to face with patient in [place]. Palliative Care was asked to follow this patient by consultation request of  Wenda Low, MD to address advance care planning and complex medical decision making. This is a follow up visit   ASSESSMENT , SYMPTOM MANAGEMENT AND PLAN / RECOMMENDATIONS:   Multiple myeloma, not in remission, with metastasis to thoracic spine and paraplegia PPS 30%, declined from 40% 1 month ago Decreased mobility, unable to ambulate or transfer Referral to Hospice if appropriate per goals or transition to Palliative Home Clinician team to continue following.  2.  Pleural effusion Has had chronic RLL pleural effusion and has previously had thoracentesis but with reaccumulation. Pulmonology indicates further intervention likely of no significant clinical benefit and has recommended Hospice.  3.  Dysphagia as late effect of stroke Question if paraplegia related to CVA versus sequelae of thoracic spinal mets or a combination. Decline in mobility, independence and swallow. Would continue pureed diet with nectar thick liquids to minimize coughing unless pt interested in pure comfort feeds. Has had weight loss of 7 lbs 3.2 oz in 8 weeks and been treated for  aspiration pneumonia  4.  Palliative Care Encounter Attempt to contact wife unsuccessful tonight, will call in the am and if interested in hospice at home, will obtain necessary orders and initiate referral to avoid delays.     Advance Care Planning/Goals of Care: Goals include to maximize quality of life and symptom management.   Exploration of personal, cultural or spiritual beliefs that might influence medical decisions  Exploration of goals of care in the event of a sudden injury or illness  Identification of a healthcare agent-spouse Jeremiah Owens  Review of an existing advance directive document-DNR/MOST . Decision not to resuscitate or to de-escalate disease focused treatments due to poor prognosis. CODE STATUS: MOST as of 08/07/22: DNR/DNI with limited additional intervention Use of antibiotics and IV fluids if indicated No feeding tube.     Follow up Palliative Care Visit: Palliative care will continue to follow for complex medical decision making, advance care planning, and clarification of goals. Pt is for d/c home tomorrow, will attempt to reach out to wife again in am to see if she is interested in Hospice following at home to coordinate home admission.   This visit was coded based on medical decision making (MDM).  PPS: 30%  HOSPICE ELIGIBILITY/DIAGNOSIS: metastatic melanoma with pleural effusion, weight loss and weakness  Chief Complaint:  Palliative Care is continuing to follow patient in setting of multiple myeloma with mets to thoracic spine with follow up as needed for refining goals of care.  HISTORY OF PRESENT ILLNESS:  Jeremiah Owens is a 89 y.o. year old male with multiple myeloma with metastasis to spinal column, hx of CVA  with residual right leg weakness and dysphagia/aphasia/dysarthria, gait disorder, aortic stenosis, constipation and hx of aspiration pneumonia and pleural effusion on the right side. He also has hx of prostate cancer, stage 3 CKD,  hypothyroidism and anemia. He has previously had radiation in 2016.  Pt denies CP, SOB, nausea/vomiting, pain, constipation or falls.  Says his appetite is good.  Facility staff states he is doing much less coughing with pureed dysphagia diet and has no current skin breakdown.  He requires hoyer lift for transfer and total care with bathing and dressing. She also notes that pt has walked a few steps with PT but otherwise is unable to ambulate and is max assist with transfers to chair/bed. He had recent follow up with Dr Lake Bells, pulmonologist for pleural effusion. He had a repeat swallow evaluation which showed likely continued aspiration of thin liquids. Currently on nectar thick liquids. He underwent a left thoracentesis with removal of 600 cc fluid 07/07/22 with cytology negative for malignancy, exudative, lymphocyte predominant.  Per Dr Anastasia Pall notes he has a chronic RLL effusion subsequent to a fall exacerbated by ongoing aspiration which will not go away.  He recommended home hospice and no further intervention. Per Otila Kluver the Doctor, hospital, pt is being d/c'd home tomorrow with wife and her understanding was they wanted to go home with hospice. He is on pureed diet, no added salt, mildly thick/nectar thick liquids  History obtained from review of EMR, discussion with primary team, and interview with facility staff and/or Mr. Stuck.     Latest Ref Rng & Units 07/06/2022    3:01 AM 07/05/2022    3:49 AM 07/04/2022    2:48 PM  CBC  WBC 4.0 - 10.5 K/uL 6.1  7.7  7.6   Hemoglobin 13.0 - 17.0 g/dL 11.1  11.0  11.2   Hematocrit 39.0 - 52.0 % 33.0  33.5  35.5   Platelets 150 - 400 K/uL 237  232  256       Latest Ref Rng & Units 07/06/2022    3:01 AM 07/05/2022    3:49 AM 07/04/2022    2:48 PM  CMP  Glucose 70 - 99 mg/dL 92  105  96   BUN 8 - 23 mg/dL '26  28  31   '$ Creatinine 0.61 - 1.24 mg/dL 1.08  1.18  1.30   Sodium 135 - 145 mmol/L 140  141  143   Potassium 3.5 - 5.1 mmol/L 3.5  3.7  4.2    Chloride 98 - 111 mmol/L 106  106  106   CO2 22 - 32 mmol/L '23  25  26   '$ Calcium 8.9 - 10.3 mg/dL 8.6  9.1  9.6   Total Protein 6.5 - 8.1 g/dL   6.7   Total Bilirubin 0.3 - 1.2 mg/dL   0.5   Alkaline Phos 38 - 126 U/L   82   AST 15 - 41 U/L   33   ALT 0 - 44 U/L   30        Latest Ref Rng & Units 07/04/2022    2:48 PM 04/16/2022   10:36 AM 01/23/2022    9:27 AM  Hepatic Function  Total Protein 6.5 - 8.1 g/dL 6.7  6.9  6.9   Albumin 3.5 - 5.0 g/dL 3.1  4.0  3.9   AST 15 - 41 U/L 33  24  20   ALT 0 - 44 U/L '30  19  18   '$ Alk Phosphatase  38 - 126 U/L 82  96  69   Total Bilirubin 0.3 - 1.2 mg/dL 0.5  0.5  0.4   08/07/22 CXR: 1. Right lung base opacity consistent with a combination of pleural fluid and atelectasis and/or pneumonia, is similar to the most recent prior chest radiograph. Questionable small area of loculated pleural fluid versus a cavitary lesion in the right mid lung is new compared to the prior exam. Consider follow-up chest CT for further assessment. 2. No other change.   I reviewed EMR for available labs, medications, imaging, studies and related documents.  Records reviewed and summarized above.   ROS General: NAD ENMT: staff endorses improved swallow on dysphagia diet with occasional cough.  Wears bilateral hearing aids Cardiovascular: denies chest pain, denies DOE Pulmonary: denies cough, denies increased SOB Abdomen: endorses good appetite, denies constipation GU: denies dysuria MSK:  noted increased weakness in late December with effusion,  no falls reported Skin: facility denies rashes or wounds Neurological: denies pain, denies insomnia Psych: Endorses positive mood Heme/lymph/immuno: denies bruises, abnormal bleeding  Physical Exam: Current and past weights: 05/07/2022 weight was 150 lbs 3.2 oz, current weight on 08/07/22 was 143 lbs. Constitutional: NAD General: frail appearing, thin ENMT: hard of hearing even with hearing aids, oral mucous membranes moist,  dentition intact CV: S1S2, IRIR with LUSB murmur consistent with aortic stenotic murmur, no LE edema Pulmonary: CTAB but diminished in bases, no increased work of breathing, no cough, room air Abdomen:normo-active BS + 4 quadrants, soft and non tender, no ascites GU: deferred MSK: noted sarcopenia of BLE and loss of muscle mass generally, moves all extremities, WC bound Skin: warm and dry, no rashes or wounds on visible skin Neuro:  noted generalized weakness,  no cognitive impairment Psych: non-anxious affect, A and O x 3 Hem/lymph/immuno: no widespread bruising  08/21/22 6:44 pm TCT pt's wife Connery Shiffler to discuss goals of care at d/c to home, will call back in the am.  Provided my number for call back. 6:48 pm left message on wife's mobile phone regarding same.   Thank you for the opportunity to participate in the care of Mr. Dills.  The palliative care team will continue to follow. Please call our office at [phone] if we can be of additional assistance.   Marijo Conception, FNP -C  COVID-19 PATIENT SCREENING TOOL Asked and negative response unless otherwise noted:   Have you had symptoms of covid, tested positive or been in contact with someone with symptoms/positive test in the past 5-10 days?  unknown

## 2022-08-22 ENCOUNTER — Telehealth: Payer: Self-pay | Admitting: Family Medicine

## 2022-08-22 ENCOUNTER — Telehealth: Payer: Self-pay

## 2022-08-22 NOTE — Telephone Encounter (Signed)
This nurse returned call to [facility] from [facility] who left a message stating that patient was discharged from skilled nursing facility and would like to continue palliative care in the home and [facility] would like to know if the MD will be the attending physician.  This nurse spoke with the provider who recommends that primary care physician be consulted to be the attending.  She acknowledged understanding and stated that she will reach out to the primary care physician.  No further concerns noted at this time.

## 2022-08-22 NOTE — Telephone Encounter (Signed)
Pt's spouse was returning my call and had left a message.  Telephone call to Jeremiah Owens and Jeremiah Owens Jeremiah Owens was with Jeremiah Owens.  She gave her contact number as [phone] as a secondary contact.  Explained that I had seen the patient the day prior and understood that he was being discharged today.  Understood that he had showed a general decline while in the facility, experiencing dysphagia and having some issues with aspiration.  Explained that my understanding from the unit manager at [facility] was that they may be interested in pursuing hospice care at home and my phone call was to follow-up and see what their goals of care were.  She states they do not want to go with hospice at present believing that patient will improve if he is in his home setting and are working to have PT, OT and speech therapy set up.  She has got private pay agency through get Jeremiah Owens who has indicated that they cannot use the Jeremiah Owens to transfer patient from chair bed to lift chair without working with PT to ensure that it is safe.  Patient has not seen his community PCP and missed an appointment while he was in the facility that had already been scheduled.  He also has a PCP, Dr Jeremiah Owens at the [place] in [place].  She states having received a phone call from the home care nurse indicating patient had significant benefit through his VA benefits for increased access and coordination of care.  She provided the nurse's name as Jeremiah Owens and the number as [phone].  She states they were most interested in making sure that patient had home PT so that he could use the lift to get into his lift chair which is his favorite chair.  Advised that patient has to have a face-to-face visit for home care and was not sure since he has not followed up recently with PCP if the facility would arrange or if they would ask myself to this palliative NP having had a face-to-face visit yesterday.  Advised I would follow-up  coordination with the facility NP at [facility] and return call to her with information.  She did not express a preference for agency but stated that Jeremiah Owens had indicated a good relationship with [facility].  Telephone call to Jeremiah Simmer, NP who is PCP at the [facility] facility to see who would be arranging home health.  Jeremiah Owens indicated to speak with the social worker Jeremiah Owens and gave her phone number for call back.  Telephone call to [facility] Owens, explained role in home care and that order would be needed to continue to follow patient from palliative perspective at home.  She will put the referral in for continued home health follow-up and was made aware that he has a scheduled follow-up on 08/27/2022 at 11 AM with Jeremiah Owens and Jeremiah Owens.  She states that she had already contacted and set up services with [facility] a couple of days ago.  Telephone call and left message with Jeremiah Owens, Jeremiah Owens and requested callback to obtain fax number to send in most recent palliative note to PCP.  Received call from Dr. Maxcine Owens nurse who states that their fax number is [phone].  Faxed 2 most recent notes of palliative care visits to Dr. Maxcine Owens office.  Received call back from Jeremiah Owens indicating that [facility] would be following for PT OT and speech and that his physical therapist name was Jeremiah Owens.  Advised  Owens that if patient demonstrated a decline and there was interested in comfort care to please reach out to [place] for care to help transition patient to hospice.  Advised her of palliative care follow-up on Wednesday, 08/27/2022 at 11 AM.  She expressed appreciation for the coordination.  Total time spent 10:46 am-12:15 pm

## 2022-08-23 DIAGNOSIS — G893 Neoplasm related pain (acute) (chronic): Secondary | ICD-10-CM | POA: Diagnosis not present

## 2022-08-23 DIAGNOSIS — H9190 Unspecified hearing loss, unspecified ear: Secondary | ICD-10-CM | POA: Diagnosis not present

## 2022-08-23 DIAGNOSIS — Z7982 Long term (current) use of aspirin: Secondary | ICD-10-CM | POA: Diagnosis not present

## 2022-08-23 DIAGNOSIS — F419 Anxiety disorder, unspecified: Secondary | ICD-10-CM | POA: Diagnosis not present

## 2022-08-23 DIAGNOSIS — Z87440 Personal history of urinary (tract) infections: Secondary | ICD-10-CM | POA: Diagnosis not present

## 2022-08-23 DIAGNOSIS — I350 Nonrheumatic aortic (valve) stenosis: Secondary | ICD-10-CM | POA: Diagnosis not present

## 2022-08-23 DIAGNOSIS — I700 Atherosclerosis of aorta: Secondary | ICD-10-CM | POA: Diagnosis not present

## 2022-08-23 DIAGNOSIS — R29898 Other symptoms and signs involving the musculoskeletal system: Secondary | ICD-10-CM | POA: Diagnosis not present

## 2022-08-23 DIAGNOSIS — M40204 Unspecified kyphosis, thoracic region: Secondary | ICD-10-CM | POA: Diagnosis not present

## 2022-08-23 DIAGNOSIS — D638 Anemia in other chronic diseases classified elsewhere: Secondary | ICD-10-CM | POA: Diagnosis not present

## 2022-08-23 DIAGNOSIS — J90 Pleural effusion, not elsewhere classified: Secondary | ICD-10-CM | POA: Diagnosis not present

## 2022-08-23 DIAGNOSIS — Z79899 Other long term (current) drug therapy: Secondary | ICD-10-CM | POA: Diagnosis not present

## 2022-08-23 DIAGNOSIS — N400 Enlarged prostate without lower urinary tract symptoms: Secondary | ICD-10-CM | POA: Diagnosis not present

## 2022-08-23 DIAGNOSIS — I69391 Dysphagia following cerebral infarction: Secondary | ICD-10-CM | POA: Diagnosis not present

## 2022-08-23 DIAGNOSIS — I69398 Other sequelae of cerebral infarction: Secondary | ICD-10-CM | POA: Diagnosis not present

## 2022-08-23 DIAGNOSIS — I69320 Aphasia following cerebral infarction: Secondary | ICD-10-CM | POA: Diagnosis not present

## 2022-08-23 DIAGNOSIS — C7951 Secondary malignant neoplasm of bone: Secondary | ICD-10-CM | POA: Diagnosis not present

## 2022-08-23 DIAGNOSIS — K5900 Constipation, unspecified: Secondary | ICD-10-CM | POA: Diagnosis not present

## 2022-08-23 DIAGNOSIS — I69322 Dysarthria following cerebral infarction: Secondary | ICD-10-CM | POA: Diagnosis not present

## 2022-08-23 DIAGNOSIS — C9000 Multiple myeloma not having achieved remission: Secondary | ICD-10-CM | POA: Diagnosis not present

## 2022-08-23 DIAGNOSIS — Z8701 Personal history of pneumonia (recurrent): Secondary | ICD-10-CM | POA: Diagnosis not present

## 2022-08-23 DIAGNOSIS — E039 Hypothyroidism, unspecified: Secondary | ICD-10-CM | POA: Diagnosis not present

## 2022-08-23 DIAGNOSIS — D630 Anemia in neoplastic disease: Secondary | ICD-10-CM | POA: Diagnosis not present

## 2022-08-23 DIAGNOSIS — N1831 Chronic kidney disease, stage 3a: Secondary | ICD-10-CM | POA: Diagnosis not present

## 2022-08-23 DIAGNOSIS — J479 Bronchiectasis, uncomplicated: Secondary | ICD-10-CM | POA: Diagnosis not present

## 2022-08-25 DIAGNOSIS — C9000 Multiple myeloma not having achieved remission: Secondary | ICD-10-CM | POA: Diagnosis not present

## 2022-08-25 DIAGNOSIS — G893 Neoplasm related pain (acute) (chronic): Secondary | ICD-10-CM | POA: Diagnosis not present

## 2022-08-25 DIAGNOSIS — R1310 Dysphagia, unspecified: Secondary | ICD-10-CM | POA: Diagnosis not present

## 2022-08-25 DIAGNOSIS — I69398 Other sequelae of cerebral infarction: Secondary | ICD-10-CM | POA: Diagnosis not present

## 2022-08-25 DIAGNOSIS — D630 Anemia in neoplastic disease: Secondary | ICD-10-CM | POA: Diagnosis not present

## 2022-08-25 DIAGNOSIS — I6340 Cerebral infarction due to embolism of unspecified cerebral artery: Secondary | ICD-10-CM | POA: Diagnosis not present

## 2022-08-25 DIAGNOSIS — C7951 Secondary malignant neoplasm of bone: Secondary | ICD-10-CM | POA: Diagnosis not present

## 2022-08-25 DIAGNOSIS — E46 Unspecified protein-calorie malnutrition: Secondary | ICD-10-CM | POA: Diagnosis not present

## 2022-08-25 DIAGNOSIS — R531 Weakness: Secondary | ICD-10-CM | POA: Diagnosis not present

## 2022-08-25 DIAGNOSIS — M6281 Muscle weakness (generalized): Secondary | ICD-10-CM | POA: Diagnosis not present

## 2022-08-25 DIAGNOSIS — J90 Pleural effusion, not elsewhere classified: Secondary | ICD-10-CM | POA: Diagnosis not present

## 2022-08-25 DIAGNOSIS — R269 Unspecified abnormalities of gait and mobility: Secondary | ICD-10-CM | POA: Diagnosis not present

## 2022-08-25 DIAGNOSIS — Z8546 Personal history of malignant neoplasm of prostate: Secondary | ICD-10-CM | POA: Diagnosis not present

## 2022-08-26 ENCOUNTER — Telehealth: Payer: Self-pay | Admitting: Family Medicine

## 2022-08-26 NOTE — Telephone Encounter (Signed)
Returning call to pt's daughter from the weekend requesting to know date and time of palliative appt. Left vm for daughter Jeremiah Owens letting her know that appt is scheduled for tomorrow at 11 am.  Damaris Hippo FNP-C

## 2022-08-27 ENCOUNTER — Telehealth: Payer: Self-pay

## 2022-08-27 ENCOUNTER — Other Ambulatory Visit: Payer: Medicare Other

## 2022-08-27 NOTE — Telephone Encounter (Signed)
0927 Palliative Care Note  RN made call to spouse's number listed in EPIC. Attempting to get home address for visit today. Only address noted in EPIC is for [facility] along with preferred contact number. Will await return call.  Jacqulyn Cane, RN

## 2022-08-28 DIAGNOSIS — I69398 Other sequelae of cerebral infarction: Secondary | ICD-10-CM | POA: Diagnosis not present

## 2022-08-28 DIAGNOSIS — C9000 Multiple myeloma not having achieved remission: Secondary | ICD-10-CM | POA: Diagnosis not present

## 2022-08-28 DIAGNOSIS — D630 Anemia in neoplastic disease: Secondary | ICD-10-CM | POA: Diagnosis not present

## 2022-08-28 DIAGNOSIS — G893 Neoplasm related pain (acute) (chronic): Secondary | ICD-10-CM | POA: Diagnosis not present

## 2022-08-28 DIAGNOSIS — J90 Pleural effusion, not elsewhere classified: Secondary | ICD-10-CM | POA: Diagnosis not present

## 2022-08-28 DIAGNOSIS — C7951 Secondary malignant neoplasm of bone: Secondary | ICD-10-CM | POA: Diagnosis not present

## 2022-08-29 DIAGNOSIS — C9000 Multiple myeloma not having achieved remission: Secondary | ICD-10-CM | POA: Diagnosis not present

## 2022-08-29 DIAGNOSIS — D630 Anemia in neoplastic disease: Secondary | ICD-10-CM | POA: Diagnosis not present

## 2022-08-29 DIAGNOSIS — J90 Pleural effusion, not elsewhere classified: Secondary | ICD-10-CM | POA: Diagnosis not present

## 2022-08-29 DIAGNOSIS — I69398 Other sequelae of cerebral infarction: Secondary | ICD-10-CM | POA: Diagnosis not present

## 2022-08-29 DIAGNOSIS — G893 Neoplasm related pain (acute) (chronic): Secondary | ICD-10-CM | POA: Diagnosis not present

## 2022-08-29 DIAGNOSIS — C7951 Secondary malignant neoplasm of bone: Secondary | ICD-10-CM | POA: Diagnosis not present

## 2022-08-30 DIAGNOSIS — I69398 Other sequelae of cerebral infarction: Secondary | ICD-10-CM | POA: Diagnosis not present

## 2022-08-30 DIAGNOSIS — C7951 Secondary malignant neoplasm of bone: Secondary | ICD-10-CM | POA: Diagnosis not present

## 2022-08-30 DIAGNOSIS — C9000 Multiple myeloma not having achieved remission: Secondary | ICD-10-CM | POA: Diagnosis not present

## 2022-08-30 DIAGNOSIS — G893 Neoplasm related pain (acute) (chronic): Secondary | ICD-10-CM | POA: Diagnosis not present

## 2022-08-30 DIAGNOSIS — D630 Anemia in neoplastic disease: Secondary | ICD-10-CM | POA: Diagnosis not present

## 2022-08-30 DIAGNOSIS — J90 Pleural effusion, not elsewhere classified: Secondary | ICD-10-CM | POA: Diagnosis not present

## 2022-09-03 DIAGNOSIS — D630 Anemia in neoplastic disease: Secondary | ICD-10-CM | POA: Diagnosis not present

## 2022-09-03 DIAGNOSIS — C9000 Multiple myeloma not having achieved remission: Secondary | ICD-10-CM | POA: Diagnosis not present

## 2022-09-03 DIAGNOSIS — C7951 Secondary malignant neoplasm of bone: Secondary | ICD-10-CM | POA: Diagnosis not present

## 2022-09-03 DIAGNOSIS — J90 Pleural effusion, not elsewhere classified: Secondary | ICD-10-CM | POA: Diagnosis not present

## 2022-09-03 DIAGNOSIS — G893 Neoplasm related pain (acute) (chronic): Secondary | ICD-10-CM | POA: Diagnosis not present

## 2022-09-03 DIAGNOSIS — I69398 Other sequelae of cerebral infarction: Secondary | ICD-10-CM | POA: Diagnosis not present

## 2022-09-05 DIAGNOSIS — J90 Pleural effusion, not elsewhere classified: Secondary | ICD-10-CM | POA: Diagnosis not present

## 2022-09-05 DIAGNOSIS — D630 Anemia in neoplastic disease: Secondary | ICD-10-CM | POA: Diagnosis not present

## 2022-09-05 DIAGNOSIS — I69398 Other sequelae of cerebral infarction: Secondary | ICD-10-CM | POA: Diagnosis not present

## 2022-09-05 DIAGNOSIS — C9000 Multiple myeloma not having achieved remission: Secondary | ICD-10-CM | POA: Diagnosis not present

## 2022-09-05 DIAGNOSIS — G893 Neoplasm related pain (acute) (chronic): Secondary | ICD-10-CM | POA: Diagnosis not present

## 2022-09-05 DIAGNOSIS — C7951 Secondary malignant neoplasm of bone: Secondary | ICD-10-CM | POA: Diagnosis not present

## 2022-09-06 DIAGNOSIS — C9000 Multiple myeloma not having achieved remission: Secondary | ICD-10-CM | POA: Diagnosis not present

## 2022-09-06 DIAGNOSIS — D630 Anemia in neoplastic disease: Secondary | ICD-10-CM | POA: Diagnosis not present

## 2022-09-06 DIAGNOSIS — G893 Neoplasm related pain (acute) (chronic): Secondary | ICD-10-CM | POA: Diagnosis not present

## 2022-09-06 DIAGNOSIS — C7951 Secondary malignant neoplasm of bone: Secondary | ICD-10-CM | POA: Diagnosis not present

## 2022-09-06 DIAGNOSIS — J90 Pleural effusion, not elsewhere classified: Secondary | ICD-10-CM | POA: Diagnosis not present

## 2022-09-06 DIAGNOSIS — I69398 Other sequelae of cerebral infarction: Secondary | ICD-10-CM | POA: Diagnosis not present

## 2022-09-08 ENCOUNTER — Other Ambulatory Visit (HOSPITAL_COMMUNITY): Payer: Self-pay | Admitting: Physician Assistant

## 2022-09-08 ENCOUNTER — Ambulatory Visit (HOSPITAL_COMMUNITY)
Admission: RE | Admit: 2022-09-08 | Discharge: 2022-09-08 | Disposition: A | Discharge status: Home / Self Care | Payer: Medicare Other | Source: Ambulatory Visit | Attending: Physician Assistant | Admitting: Physician Assistant

## 2022-09-08 DIAGNOSIS — M7989 Other specified soft tissue disorders: Secondary | ICD-10-CM

## 2022-09-08 NOTE — Progress Notes (Signed)
Left upper extremity venous duplex has been completed. Preliminary results can be found in CV Proc through chart review.  Results were given to [facility] PA.  09/08/22 1:00 PM Carlos Levering RVT

## 2022-09-09 DIAGNOSIS — C9000 Multiple myeloma not having achieved remission: Secondary | ICD-10-CM | POA: Diagnosis not present

## 2022-09-09 DIAGNOSIS — G893 Neoplasm related pain (acute) (chronic): Secondary | ICD-10-CM | POA: Diagnosis not present

## 2022-09-09 DIAGNOSIS — D630 Anemia in neoplastic disease: Secondary | ICD-10-CM | POA: Diagnosis not present

## 2022-09-09 DIAGNOSIS — J90 Pleural effusion, not elsewhere classified: Secondary | ICD-10-CM | POA: Diagnosis not present

## 2022-09-09 DIAGNOSIS — I69398 Other sequelae of cerebral infarction: Secondary | ICD-10-CM | POA: Diagnosis not present

## 2022-09-09 DIAGNOSIS — C7951 Secondary malignant neoplasm of bone: Secondary | ICD-10-CM | POA: Diagnosis not present

## 2022-09-11 DIAGNOSIS — I69398 Other sequelae of cerebral infarction: Secondary | ICD-10-CM | POA: Diagnosis not present

## 2022-09-11 DIAGNOSIS — G893 Neoplasm related pain (acute) (chronic): Secondary | ICD-10-CM | POA: Diagnosis not present

## 2022-09-11 DIAGNOSIS — D630 Anemia in neoplastic disease: Secondary | ICD-10-CM | POA: Diagnosis not present

## 2022-09-11 DIAGNOSIS — C9000 Multiple myeloma not having achieved remission: Secondary | ICD-10-CM | POA: Diagnosis not present

## 2022-09-11 DIAGNOSIS — C7951 Secondary malignant neoplasm of bone: Secondary | ICD-10-CM | POA: Diagnosis not present

## 2022-09-11 DIAGNOSIS — J90 Pleural effusion, not elsewhere classified: Secondary | ICD-10-CM | POA: Diagnosis not present

## 2022-09-12 DIAGNOSIS — G893 Neoplasm related pain (acute) (chronic): Secondary | ICD-10-CM | POA: Diagnosis not present

## 2022-09-12 DIAGNOSIS — D630 Anemia in neoplastic disease: Secondary | ICD-10-CM | POA: Diagnosis not present

## 2022-09-12 DIAGNOSIS — J90 Pleural effusion, not elsewhere classified: Secondary | ICD-10-CM | POA: Diagnosis not present

## 2022-09-12 DIAGNOSIS — C9000 Multiple myeloma not having achieved remission: Secondary | ICD-10-CM | POA: Diagnosis not present

## 2022-09-12 DIAGNOSIS — C7951 Secondary malignant neoplasm of bone: Secondary | ICD-10-CM | POA: Diagnosis not present

## 2022-09-12 DIAGNOSIS — I69398 Other sequelae of cerebral infarction: Secondary | ICD-10-CM | POA: Diagnosis not present

## 2022-09-17 DIAGNOSIS — I69398 Other sequelae of cerebral infarction: Secondary | ICD-10-CM | POA: Diagnosis not present

## 2022-09-17 DIAGNOSIS — C7951 Secondary malignant neoplasm of bone: Secondary | ICD-10-CM | POA: Diagnosis not present

## 2022-09-17 DIAGNOSIS — G893 Neoplasm related pain (acute) (chronic): Secondary | ICD-10-CM | POA: Diagnosis not present

## 2022-09-17 DIAGNOSIS — J90 Pleural effusion, not elsewhere classified: Secondary | ICD-10-CM | POA: Diagnosis not present

## 2022-09-17 DIAGNOSIS — D630 Anemia in neoplastic disease: Secondary | ICD-10-CM | POA: Diagnosis not present

## 2022-09-17 DIAGNOSIS — C9000 Multiple myeloma not having achieved remission: Secondary | ICD-10-CM | POA: Diagnosis not present

## 2022-09-18 ENCOUNTER — Ambulatory Visit: Payer: Medicare Other | Admitting: Pulmonary Disease

## 2022-09-18 ENCOUNTER — Ambulatory Visit (INDEPENDENT_AMBULATORY_CARE_PROVIDER_SITE_OTHER): Payer: Medicare Other

## 2022-09-18 ENCOUNTER — Encounter: Payer: Self-pay | Admitting: Pulmonary Disease

## 2022-09-18 VITALS — BP 104/68 | HR 77 | Temp 97.9°F

## 2022-09-18 DIAGNOSIS — J90 Pleural effusion, not elsewhere classified: Secondary | ICD-10-CM

## 2022-09-18 DIAGNOSIS — C7951 Secondary malignant neoplasm of bone: Secondary | ICD-10-CM | POA: Diagnosis not present

## 2022-09-18 DIAGNOSIS — G893 Neoplasm related pain (acute) (chronic): Secondary | ICD-10-CM | POA: Diagnosis not present

## 2022-09-18 DIAGNOSIS — I69398 Other sequelae of cerebral infarction: Secondary | ICD-10-CM | POA: Diagnosis not present

## 2022-09-18 DIAGNOSIS — D630 Anemia in neoplastic disease: Secondary | ICD-10-CM | POA: Diagnosis not present

## 2022-09-18 DIAGNOSIS — C9000 Multiple myeloma not having achieved remission: Secondary | ICD-10-CM | POA: Diagnosis not present

## 2022-09-18 NOTE — Progress Notes (Signed)
Synopsis: Referred in January 2024 for pleural effusion identified when he was hospitalized for generalized weakness and a urinary tract infection in December 2023.  He has a history of aortic stenosis, multiple myeloma.  Subjective:   PATIENT ID: Jeremiah Owens GENDER: male DOB: May 25, 1930, MRN: [id number]   HPI  No chief complaint on file.   Jeremiah Owens has been doing OK since the last visit Yesterday he walked about 75 feet. No problems with shortness of breath No fevers He is eating okay His wife says that he coughs from time to time but is not coughing up much.  Past Medical History:  Diagnosis Date   Allergy    Anxiety    Bone cancer ([place])    t_spine T-5   Compression fracture    Encounter for antineoplastic chemotherapy 01/31/2015   Hearing loss    Melanoma ([place])    Multiple myeloma ([place])    Prostate cancer ([place]) 2002   S/P radiation therapy 08/21/14-09/04/14   T4-6 25Gy/50f   S/P radiation therapy 11/29/14-12/12/14   rt prox humerus/shoulder/scapula 25Gy/16f  Skin cancer 1994   melanoma  right neck    Stroke [place]   Thyroid disease      Review of Systems  Constitutional:  Negative for chills, fever, malaise/fatigue and weight loss.  HENT:  Negative for congestion, sinus pain and sore throat.   Respiratory:  Negative for cough, sputum production and shortness of breath.   Cardiovascular:  Negative for chest pain and leg swelling.      Objective:  Physical Exam   Vitals:   09/18/22 1106  BP: 104/68  Pulse: 77  Temp: 97.9 F (36.6 C)  TempSrc: Oral  SpO2: 99%    Gen: chronically ill appearing, in a wheelchair HENT: OP clear, neck supple PULM: Diminished R base, otherwise clear B, normal effort  CV: RRR, no mgr GI: BS+, soft, nontender Derm: dry, scaling  Psyche: normal mood and affect   CBC    Component Value Date/Time   WBC 6.1 07/06/2022 0301   RBC 3.94 (L) 07/06/2022 0301   HGB 11.1 (L) 07/06/2022 0301   HGB 11.8 (L) 04/16/2022 1036    HGB 13.4 07/15/2017 0949   HCT 33.0 (L) 07/06/2022 0301   HCT 40.6 07/15/2017 0949   PLT 237 07/06/2022 0301   PLT 205 04/16/2022 1036   PLT 121 (L) 07/15/2017 0949   MCV 83.8 07/06/2022 0301   MCV 97.4 07/15/2017 0949   MCH 28.2 07/06/2022 0301   MCHC 33.6 07/06/2022 0301   RDW 16.1 (H) 07/06/2022 0301   RDW 13.9 07/15/2017 0949   LYMPHSABS 1.0 07/06/2022 0301   LYMPHSABS 0.7 (L) 07/15/2017 0949   MONOABS 0.5 07/06/2022 0301   MONOABS 0.3 07/15/2017 0949   EOSABS 0.2 07/06/2022 0301   EOSABS 0.1 07/15/2017 0949   BASOSABS 0.0 07/06/2022 0301   BASOSABS 0.0 07/15/2017 0949     Chest imaging: August 2023 CT chest abdomen pelvis independently reviewed showing bronchiectasis in the left base, compressive atelectasis right base and a pleural effusion on the right, some nonspecific interstitial changes noted, not clearly fibrosis.  PFT:  Labs: July 07, 2022 left pleural effusion cytology negative for malignancy, exudative, lymphocyte predominant  Path:  Echo:  Heart Catheterization:       Assessment & Plan:   No diagnosis found.  Discussion: RoJahsehomes back to clinic today with no overt signs or symptoms of worsening pneumonia or pleural effusion.  As stated previously, he scat a  fairly large left-sided pleural effusion which we have tapped and there is no evidence of malignancy or infection.  I think this is a postinflammatory process from chronic aspiration.  He and his wife were not interested in repeat thoracentesis when we met last time.  Overall he is physically very deconditioned and I think high risk of death in the next 6 months.  On the last visit we recommended hospice.  Plan: Left-sided pleural effusion: Based on today's physical exam and visit there is no evidence of recurrence or worsening Will check a chest x-ray today to be sure I we will call you with the results of the chest x-ray If you find that he develops worsening cough, or shortness of breath  would be happy to see him back Use caution when eating, take precautions to avoid aspiration  Follow-up with [place] on an as-needed basis  Immunizations: Immunization History  Administered Date(s) Administered   Fluad Quad(high Dose 65+) 04/27/2019   Influenza Split 05/08/2014   Influenza Whole 05/19/2002   Influenza, High Dose Seasonal PF 06/15/2009, 05/08/2010, 05/14/2016, 05/04/2017   Influenza,inj,Quad PF,6+ Mos 05/09/2015   Influenza-Unspecified 07/02/2011, 06/07/2012, 05/22/2016, 05/21/2018, 04/23/2019   PFIZER(Purple Top)SARS-COV-2 Vaccination 09/15/2019   Pneumococcal Conjugate-13 04/21/2014   Pneumococcal-Unspecified 05/04/2004, 05/13/2014   Tdap 04/04/2010, 03/04/2020, 03/29/2020     Current Outpatient Medications:    aspirin 81 MG EC tablet, Take 81 mg by mouth daily., Disp: , Rfl: 0   Calcium Carbonate-Vitamin D (CALTRATE 600+D PO), Take 600 mg by mouth 2 (two) times daily., Disp: , Rfl:    cholecalciferol (VITAMIN D) 1000 UNITS tablet, Take 1,000 Units by mouth at bedtime. , Disp: , Rfl:    furosemide (LASIX) 20 MG tablet, Take 20 mg by mouth as directed. Take 1 tablets by mouth every, Wednesday, Friday, Saturday, Sunday and 2 tablets by mouth Tuesday and Thursday, Disp: , Rfl:    hydrocortisone 2.5 % lotion, Apply 1 application  topically at bedtime as needed (apply to redness.rash daily prn)., Disp: , Rfl:    KLOR-CON 10 10 MEQ tablet, Take 10 mEq by mouth daily., Disp: , Rfl:    levothyroxine (SYNTHROID) 125 MCG tablet, 1 tablet in the morning on an empty stomach, Disp: , Rfl:    Multiple Vitamins-Minerals (CENTRUM SILVER PO), Take 1 tablet by mouth daily., Disp: , Rfl:    Nutritional Supplement LIQD, Take 1 each by mouth daily. Med Pass 2.0, Disp: , Rfl:    omeprazole (PRILOSEC) 40 MG capsule, Take 40 mg by mouth daily as needed (heartburn). , Disp: , Rfl:    saccharomyces boulardii (FLORASTOR) 250 MG capsule, Take 250 mg by mouth daily., Disp: , Rfl:    tamsulosin  (FLOMAX) 0.4 MG CAPS capsule, Take 1 capsule (0.4 mg total) by mouth at bedtime., Disp: 30 capsule, Rfl: 3   TEXACORT 2.5 % SOLN, Apply 1 Application topically daily as needed (for scalp)., Disp: , Rfl: 2 No current facility-administered medications for this visit.  Facility-Administered Medications Ordered in Other Visits:    sodium chloride 0.9 % injection 10 mL, 10 mL, Intracatheter, PRN, Curt Bears, MD

## 2022-09-18 NOTE — Patient Instructions (Signed)
Left-sided pleural effusion: Based on today's physical exam and visit there is no evidence of recurrence or worsening Will check a chest x-ray today to be sure I we will call you with the results of the chest x-ray If you find that he develops worsening cough, or shortness of breath would be happy to see him back Use caution when eating, take precautions to avoid aspiration  Follow-up with [place] on an as-needed basis

## 2022-09-19 DIAGNOSIS — I69398 Other sequelae of cerebral infarction: Secondary | ICD-10-CM | POA: Diagnosis not present

## 2022-09-19 DIAGNOSIS — D630 Anemia in neoplastic disease: Secondary | ICD-10-CM | POA: Diagnosis not present

## 2022-09-19 DIAGNOSIS — C9000 Multiple myeloma not having achieved remission: Secondary | ICD-10-CM | POA: Diagnosis not present

## 2022-09-19 DIAGNOSIS — J90 Pleural effusion, not elsewhere classified: Secondary | ICD-10-CM | POA: Diagnosis not present

## 2022-09-19 DIAGNOSIS — C7951 Secondary malignant neoplasm of bone: Secondary | ICD-10-CM | POA: Diagnosis not present

## 2022-09-19 DIAGNOSIS — G893 Neoplasm related pain (acute) (chronic): Secondary | ICD-10-CM | POA: Diagnosis not present

## 2022-09-22 DIAGNOSIS — H9190 Unspecified hearing loss, unspecified ear: Secondary | ICD-10-CM | POA: Diagnosis not present

## 2022-09-22 DIAGNOSIS — F419 Anxiety disorder, unspecified: Secondary | ICD-10-CM | POA: Diagnosis not present

## 2022-09-22 DIAGNOSIS — Z87440 Personal history of urinary (tract) infections: Secondary | ICD-10-CM | POA: Diagnosis not present

## 2022-09-22 DIAGNOSIS — R29898 Other symptoms and signs involving the musculoskeletal system: Secondary | ICD-10-CM | POA: Diagnosis not present

## 2022-09-22 DIAGNOSIS — E039 Hypothyroidism, unspecified: Secondary | ICD-10-CM | POA: Diagnosis not present

## 2022-09-22 DIAGNOSIS — G893 Neoplasm related pain (acute) (chronic): Secondary | ICD-10-CM | POA: Diagnosis not present

## 2022-09-22 DIAGNOSIS — D638 Anemia in other chronic diseases classified elsewhere: Secondary | ICD-10-CM | POA: Diagnosis not present

## 2022-09-22 DIAGNOSIS — Z7982 Long term (current) use of aspirin: Secondary | ICD-10-CM | POA: Diagnosis not present

## 2022-09-22 DIAGNOSIS — Z79899 Other long term (current) drug therapy: Secondary | ICD-10-CM | POA: Diagnosis not present

## 2022-09-22 DIAGNOSIS — J479 Bronchiectasis, uncomplicated: Secondary | ICD-10-CM | POA: Diagnosis not present

## 2022-09-22 DIAGNOSIS — D630 Anemia in neoplastic disease: Secondary | ICD-10-CM | POA: Diagnosis not present

## 2022-09-22 DIAGNOSIS — Z8701 Personal history of pneumonia (recurrent): Secondary | ICD-10-CM | POA: Diagnosis not present

## 2022-09-22 DIAGNOSIS — I700 Atherosclerosis of aorta: Secondary | ICD-10-CM | POA: Diagnosis not present

## 2022-09-22 DIAGNOSIS — N1831 Chronic kidney disease, stage 3a: Secondary | ICD-10-CM | POA: Diagnosis not present

## 2022-09-22 DIAGNOSIS — J90 Pleural effusion, not elsewhere classified: Secondary | ICD-10-CM | POA: Diagnosis not present

## 2022-09-22 DIAGNOSIS — N400 Enlarged prostate without lower urinary tract symptoms: Secondary | ICD-10-CM | POA: Diagnosis not present

## 2022-09-22 DIAGNOSIS — I350 Nonrheumatic aortic (valve) stenosis: Secondary | ICD-10-CM | POA: Diagnosis not present

## 2022-09-22 DIAGNOSIS — K5900 Constipation, unspecified: Secondary | ICD-10-CM | POA: Diagnosis not present

## 2022-09-22 DIAGNOSIS — I69320 Aphasia following cerebral infarction: Secondary | ICD-10-CM | POA: Diagnosis not present

## 2022-09-22 DIAGNOSIS — C7951 Secondary malignant neoplasm of bone: Secondary | ICD-10-CM | POA: Diagnosis not present

## 2022-09-22 DIAGNOSIS — M40204 Unspecified kyphosis, thoracic region: Secondary | ICD-10-CM | POA: Diagnosis not present

## 2022-09-22 DIAGNOSIS — I69398 Other sequelae of cerebral infarction: Secondary | ICD-10-CM | POA: Diagnosis not present

## 2022-09-22 DIAGNOSIS — I69322 Dysarthria following cerebral infarction: Secondary | ICD-10-CM | POA: Diagnosis not present

## 2022-09-22 DIAGNOSIS — I69391 Dysphagia following cerebral infarction: Secondary | ICD-10-CM | POA: Diagnosis not present

## 2022-09-22 DIAGNOSIS — C9000 Multiple myeloma not having achieved remission: Secondary | ICD-10-CM | POA: Diagnosis not present

## 2022-09-23 DIAGNOSIS — I69398 Other sequelae of cerebral infarction: Secondary | ICD-10-CM | POA: Diagnosis not present

## 2022-09-23 DIAGNOSIS — J90 Pleural effusion, not elsewhere classified: Secondary | ICD-10-CM | POA: Diagnosis not present

## 2022-09-23 DIAGNOSIS — C9000 Multiple myeloma not having achieved remission: Secondary | ICD-10-CM | POA: Diagnosis not present

## 2022-09-23 DIAGNOSIS — D630 Anemia in neoplastic disease: Secondary | ICD-10-CM | POA: Diagnosis not present

## 2022-09-23 DIAGNOSIS — C7951 Secondary malignant neoplasm of bone: Secondary | ICD-10-CM | POA: Diagnosis not present

## 2022-09-23 DIAGNOSIS — G893 Neoplasm related pain (acute) (chronic): Secondary | ICD-10-CM | POA: Diagnosis not present

## 2022-09-26 DIAGNOSIS — G893 Neoplasm related pain (acute) (chronic): Secondary | ICD-10-CM | POA: Diagnosis not present

## 2022-09-26 DIAGNOSIS — C7951 Secondary malignant neoplasm of bone: Secondary | ICD-10-CM | POA: Diagnosis not present

## 2022-09-26 DIAGNOSIS — C9000 Multiple myeloma not having achieved remission: Secondary | ICD-10-CM | POA: Diagnosis not present

## 2022-09-26 DIAGNOSIS — D630 Anemia in neoplastic disease: Secondary | ICD-10-CM | POA: Diagnosis not present

## 2022-09-26 DIAGNOSIS — J90 Pleural effusion, not elsewhere classified: Secondary | ICD-10-CM | POA: Diagnosis not present

## 2022-09-26 DIAGNOSIS — I69398 Other sequelae of cerebral infarction: Secondary | ICD-10-CM | POA: Diagnosis not present

## 2022-09-29 DIAGNOSIS — I69398 Other sequelae of cerebral infarction: Secondary | ICD-10-CM | POA: Diagnosis not present

## 2022-09-29 DIAGNOSIS — G893 Neoplasm related pain (acute) (chronic): Secondary | ICD-10-CM | POA: Diagnosis not present

## 2022-09-29 DIAGNOSIS — D630 Anemia in neoplastic disease: Secondary | ICD-10-CM | POA: Diagnosis not present

## 2022-09-29 DIAGNOSIS — C9000 Multiple myeloma not having achieved remission: Secondary | ICD-10-CM | POA: Diagnosis not present

## 2022-09-29 DIAGNOSIS — J90 Pleural effusion, not elsewhere classified: Secondary | ICD-10-CM | POA: Diagnosis not present

## 2022-09-29 DIAGNOSIS — C7951 Secondary malignant neoplasm of bone: Secondary | ICD-10-CM | POA: Diagnosis not present

## 2022-10-03 DIAGNOSIS — J90 Pleural effusion, not elsewhere classified: Secondary | ICD-10-CM | POA: Diagnosis not present

## 2022-10-03 DIAGNOSIS — C9000 Multiple myeloma not having achieved remission: Secondary | ICD-10-CM | POA: Diagnosis not present

## 2022-10-03 DIAGNOSIS — C7951 Secondary malignant neoplasm of bone: Secondary | ICD-10-CM | POA: Diagnosis not present

## 2022-10-03 DIAGNOSIS — D630 Anemia in neoplastic disease: Secondary | ICD-10-CM | POA: Diagnosis not present

## 2022-10-03 DIAGNOSIS — I69398 Other sequelae of cerebral infarction: Secondary | ICD-10-CM | POA: Diagnosis not present

## 2022-10-03 DIAGNOSIS — G893 Neoplasm related pain (acute) (chronic): Secondary | ICD-10-CM | POA: Diagnosis not present

## 2022-10-09 DIAGNOSIS — D630 Anemia in neoplastic disease: Secondary | ICD-10-CM | POA: Diagnosis not present

## 2022-10-09 DIAGNOSIS — G893 Neoplasm related pain (acute) (chronic): Secondary | ICD-10-CM | POA: Diagnosis not present

## 2022-10-09 DIAGNOSIS — C7951 Secondary malignant neoplasm of bone: Secondary | ICD-10-CM | POA: Diagnosis not present

## 2022-10-09 DIAGNOSIS — I69398 Other sequelae of cerebral infarction: Secondary | ICD-10-CM | POA: Diagnosis not present

## 2022-10-09 DIAGNOSIS — J90 Pleural effusion, not elsewhere classified: Secondary | ICD-10-CM | POA: Diagnosis not present

## 2022-10-09 DIAGNOSIS — C9000 Multiple myeloma not having achieved remission: Secondary | ICD-10-CM | POA: Diagnosis not present

## 2022-10-10 DIAGNOSIS — I69398 Other sequelae of cerebral infarction: Secondary | ICD-10-CM | POA: Diagnosis not present

## 2022-10-10 DIAGNOSIS — J90 Pleural effusion, not elsewhere classified: Secondary | ICD-10-CM | POA: Diagnosis not present

## 2022-10-10 DIAGNOSIS — G893 Neoplasm related pain (acute) (chronic): Secondary | ICD-10-CM | POA: Diagnosis not present

## 2022-10-10 DIAGNOSIS — C9000 Multiple myeloma not having achieved remission: Secondary | ICD-10-CM | POA: Diagnosis not present

## 2022-10-10 DIAGNOSIS — D630 Anemia in neoplastic disease: Secondary | ICD-10-CM | POA: Diagnosis not present

## 2022-10-10 DIAGNOSIS — C7951 Secondary malignant neoplasm of bone: Secondary | ICD-10-CM | POA: Diagnosis not present

## 2022-10-13 DIAGNOSIS — G893 Neoplasm related pain (acute) (chronic): Secondary | ICD-10-CM | POA: Diagnosis not present

## 2022-10-13 DIAGNOSIS — D630 Anemia in neoplastic disease: Secondary | ICD-10-CM | POA: Diagnosis not present

## 2022-10-13 DIAGNOSIS — I69398 Other sequelae of cerebral infarction: Secondary | ICD-10-CM | POA: Diagnosis not present

## 2022-10-13 DIAGNOSIS — C9000 Multiple myeloma not having achieved remission: Secondary | ICD-10-CM | POA: Diagnosis not present

## 2022-10-13 DIAGNOSIS — J90 Pleural effusion, not elsewhere classified: Secondary | ICD-10-CM | POA: Diagnosis not present

## 2022-10-13 DIAGNOSIS — C7951 Secondary malignant neoplasm of bone: Secondary | ICD-10-CM | POA: Diagnosis not present

## 2022-10-15 DIAGNOSIS — C7951 Secondary malignant neoplasm of bone: Secondary | ICD-10-CM | POA: Diagnosis not present

## 2022-10-15 DIAGNOSIS — I69398 Other sequelae of cerebral infarction: Secondary | ICD-10-CM | POA: Diagnosis not present

## 2022-10-15 DIAGNOSIS — C9000 Multiple myeloma not having achieved remission: Secondary | ICD-10-CM | POA: Diagnosis not present

## 2022-10-15 DIAGNOSIS — G893 Neoplasm related pain (acute) (chronic): Secondary | ICD-10-CM | POA: Diagnosis not present

## 2022-10-15 DIAGNOSIS — D630 Anemia in neoplastic disease: Secondary | ICD-10-CM | POA: Diagnosis not present

## 2022-10-15 DIAGNOSIS — J90 Pleural effusion, not elsewhere classified: Secondary | ICD-10-CM | POA: Diagnosis not present

## 2022-10-19 DIAGNOSIS — C7951 Secondary malignant neoplasm of bone: Secondary | ICD-10-CM | POA: Diagnosis not present

## 2022-10-19 DIAGNOSIS — D630 Anemia in neoplastic disease: Secondary | ICD-10-CM | POA: Diagnosis not present

## 2022-10-19 DIAGNOSIS — G893 Neoplasm related pain (acute) (chronic): Secondary | ICD-10-CM | POA: Diagnosis not present

## 2022-10-19 DIAGNOSIS — C9000 Multiple myeloma not having achieved remission: Secondary | ICD-10-CM | POA: Diagnosis not present

## 2022-10-19 DIAGNOSIS — I69398 Other sequelae of cerebral infarction: Secondary | ICD-10-CM | POA: Diagnosis not present

## 2022-10-19 DIAGNOSIS — J90 Pleural effusion, not elsewhere classified: Secondary | ICD-10-CM | POA: Diagnosis not present

## 2022-10-20 DIAGNOSIS — C7951 Secondary malignant neoplasm of bone: Secondary | ICD-10-CM | POA: Diagnosis not present

## 2022-10-20 DIAGNOSIS — I69398 Other sequelae of cerebral infarction: Secondary | ICD-10-CM | POA: Diagnosis not present

## 2022-10-20 DIAGNOSIS — D630 Anemia in neoplastic disease: Secondary | ICD-10-CM | POA: Diagnosis not present

## 2022-10-20 DIAGNOSIS — G893 Neoplasm related pain (acute) (chronic): Secondary | ICD-10-CM | POA: Diagnosis not present

## 2022-10-20 DIAGNOSIS — C9000 Multiple myeloma not having achieved remission: Secondary | ICD-10-CM | POA: Diagnosis not present

## 2022-10-20 DIAGNOSIS — J90 Pleural effusion, not elsewhere classified: Secondary | ICD-10-CM | POA: Diagnosis not present

## 2022-10-22 ENCOUNTER — Inpatient Hospital Stay: Payer: Medicare Other | Attending: Internal Medicine

## 2022-10-22 ENCOUNTER — Other Ambulatory Visit: Payer: Self-pay

## 2022-10-22 ENCOUNTER — Inpatient Hospital Stay (HOSPITAL_BASED_OUTPATIENT_CLINIC_OR_DEPARTMENT_OTHER): Payer: Medicare Other | Admitting: Internal Medicine

## 2022-10-22 ENCOUNTER — Encounter: Payer: Self-pay | Admitting: Internal Medicine

## 2022-10-22 ENCOUNTER — Inpatient Hospital Stay: Payer: Medicare Other

## 2022-10-22 VITALS — BP 109/60 | HR 86 | Resp 17

## 2022-10-22 DIAGNOSIS — N1831 Chronic kidney disease, stage 3a: Secondary | ICD-10-CM | POA: Diagnosis not present

## 2022-10-22 DIAGNOSIS — Z923 Personal history of irradiation: Secondary | ICD-10-CM | POA: Insufficient documentation

## 2022-10-22 DIAGNOSIS — D630 Anemia in neoplastic disease: Secondary | ICD-10-CM | POA: Diagnosis not present

## 2022-10-22 DIAGNOSIS — H9190 Unspecified hearing loss, unspecified ear: Secondary | ICD-10-CM | POA: Diagnosis not present

## 2022-10-22 DIAGNOSIS — K5900 Constipation, unspecified: Secondary | ICD-10-CM | POA: Diagnosis not present

## 2022-10-22 DIAGNOSIS — F419 Anxiety disorder, unspecified: Secondary | ICD-10-CM | POA: Diagnosis not present

## 2022-10-22 DIAGNOSIS — G893 Neoplasm related pain (acute) (chronic): Secondary | ICD-10-CM | POA: Diagnosis not present

## 2022-10-22 DIAGNOSIS — K219 Gastro-esophageal reflux disease without esophagitis: Secondary | ICD-10-CM | POA: Diagnosis not present

## 2022-10-22 DIAGNOSIS — I700 Atherosclerosis of aorta: Secondary | ICD-10-CM | POA: Diagnosis not present

## 2022-10-22 DIAGNOSIS — I69341 Monoplegia of lower limb following cerebral infarction affecting right dominant side: Secondary | ICD-10-CM | POA: Diagnosis not present

## 2022-10-22 DIAGNOSIS — E079 Disorder of thyroid, unspecified: Secondary | ICD-10-CM | POA: Insufficient documentation

## 2022-10-22 DIAGNOSIS — Z7982 Long term (current) use of aspirin: Secondary | ICD-10-CM | POA: Diagnosis not present

## 2022-10-22 DIAGNOSIS — M67912 Unspecified disorder of synovium and tendon, left shoulder: Secondary | ICD-10-CM | POA: Diagnosis not present

## 2022-10-22 DIAGNOSIS — M40204 Unspecified kyphosis, thoracic region: Secondary | ICD-10-CM | POA: Diagnosis not present

## 2022-10-22 DIAGNOSIS — N400 Enlarged prostate without lower urinary tract symptoms: Secondary | ICD-10-CM | POA: Diagnosis not present

## 2022-10-22 DIAGNOSIS — C7951 Secondary malignant neoplasm of bone: Secondary | ICD-10-CM | POA: Diagnosis not present

## 2022-10-22 DIAGNOSIS — C9000 Multiple myeloma not having achieved remission: Secondary | ICD-10-CM

## 2022-10-22 DIAGNOSIS — I350 Nonrheumatic aortic (valve) stenosis: Secondary | ICD-10-CM | POA: Diagnosis not present

## 2022-10-22 DIAGNOSIS — I69398 Other sequelae of cerebral infarction: Secondary | ICD-10-CM | POA: Diagnosis not present

## 2022-10-22 DIAGNOSIS — Z79899 Other long term (current) drug therapy: Secondary | ICD-10-CM | POA: Insufficient documentation

## 2022-10-22 DIAGNOSIS — R1312 Dysphagia, oropharyngeal phase: Secondary | ICD-10-CM | POA: Diagnosis not present

## 2022-10-22 DIAGNOSIS — E039 Hypothyroidism, unspecified: Secondary | ICD-10-CM | POA: Diagnosis not present

## 2022-10-22 DIAGNOSIS — Z8546 Personal history of malignant neoplasm of prostate: Secondary | ICD-10-CM | POA: Diagnosis not present

## 2022-10-22 DIAGNOSIS — G8220 Paraplegia, unspecified: Secondary | ICD-10-CM | POA: Diagnosis not present

## 2022-10-22 DIAGNOSIS — I69322 Dysarthria following cerebral infarction: Secondary | ICD-10-CM | POA: Diagnosis not present

## 2022-10-22 DIAGNOSIS — R29898 Other symptoms and signs involving the musculoskeletal system: Secondary | ICD-10-CM | POA: Diagnosis not present

## 2022-10-22 DIAGNOSIS — D631 Anemia in chronic kidney disease: Secondary | ICD-10-CM | POA: Diagnosis not present

## 2022-10-22 DIAGNOSIS — Z9221 Personal history of antineoplastic chemotherapy: Secondary | ICD-10-CM | POA: Insufficient documentation

## 2022-10-22 DIAGNOSIS — I69391 Dysphagia following cerebral infarction: Secondary | ICD-10-CM | POA: Diagnosis not present

## 2022-10-22 DIAGNOSIS — I69320 Aphasia following cerebral infarction: Secondary | ICD-10-CM | POA: Diagnosis not present

## 2022-10-22 DIAGNOSIS — J479 Bronchiectasis, uncomplicated: Secondary | ICD-10-CM | POA: Diagnosis not present

## 2022-10-22 DIAGNOSIS — Z7961 Long term (current) use of immunomodulator: Secondary | ICD-10-CM | POA: Insufficient documentation

## 2022-10-22 DIAGNOSIS — Z7969 Long term (current) use of other immunomodulators and immunosuppressants: Secondary | ICD-10-CM | POA: Diagnosis not present

## 2022-10-22 LAB — CBC WITH DIFFERENTIAL (CANCER CENTER ONLY)
Abs Immature Granulocytes: 0.03 10*3/uL (ref 0.00–0.07)
Basophils Absolute: 0 10*3/uL (ref 0.0–0.1)
Basophils Relative: 0 %
Eosinophils Absolute: 0.3 10*3/uL (ref 0.0–0.5)
Eosinophils Relative: 4 %
HCT: 37.1 % — ABNORMAL LOW (ref 39.0–52.0)
Hemoglobin: 12.2 g/dL — ABNORMAL LOW (ref 13.0–17.0)
Immature Granulocytes: 0 %
Lymphocytes Relative: 11 %
Lymphs Abs: 0.8 10*3/uL (ref 0.7–4.0)
MCH: 27.9 pg (ref 26.0–34.0)
MCHC: 32.9 g/dL (ref 30.0–36.0)
MCV: 84.9 fL (ref 80.0–100.0)
Monocytes Absolute: 0.4 10*3/uL (ref 0.1–1.0)
Monocytes Relative: 5 %
Neutro Abs: 5.6 10*3/uL (ref 1.7–7.7)
Neutrophils Relative %: 80 %
Platelet Count: 209 10*3/uL (ref 150–400)
RBC: 4.37 MIL/uL (ref 4.22–5.81)
RDW: 18.1 % — ABNORMAL HIGH (ref 11.5–15.5)
WBC Count: 7.1 10*3/uL (ref 4.0–10.5)
nRBC: 0 % (ref 0.0–0.2)

## 2022-10-22 LAB — CMP (CANCER CENTER ONLY)
ALT: 26 U/L (ref 0–44)
AST: 26 U/L (ref 15–41)
Albumin: 3.6 g/dL (ref 3.5–5.0)
Alkaline Phosphatase: 108 U/L (ref 38–126)
Anion gap: 8 (ref 5–15)
BUN: 18 mg/dL (ref 8–23)
CO2: 27 mmol/L (ref 22–32)
Calcium: 9.2 mg/dL (ref 8.9–10.3)
Chloride: 103 mmol/L (ref 98–111)
Creatinine: 0.78 mg/dL (ref 0.61–1.24)
GFR, Estimated: >60 mL/min (ref >60)
Glucose, Bld: 114 mg/dL — ABNORMAL HIGH (ref 70–99)
Potassium: 3.9 mmol/L (ref 3.5–5.1)
Sodium: 138 mmol/L (ref 135–145)
Total Bilirubin: 0.5 mg/dL (ref 0.3–1.2)
Total Protein: 6.7 g/dL (ref 6.5–8.1)

## 2022-10-22 LAB — LACTATE DEHYDROGENASE: LDH: 164 U/L (ref 98–192)

## 2022-10-22 MED ORDER — SODIUM CHLORIDE 0.9 % IV SOLN: Freq: Once | INTRAVENOUS | Status: AC

## 2022-10-22 MED ORDER — ZOLEDRONIC ACID 4 MG/100ML IV SOLN
4.0000 mg | Freq: Once | INTRAVENOUS | Status: AC
  Administered 2022-10-22: 4 mg via INTRAVENOUS
  Filled 2022-10-22: qty 100

## 2022-10-22 NOTE — Patient Instructions (Signed)
Zoledronic Acid Injection What is this medication? ZOLEDRONIC ACID (ZOE le dron ik AS id) treats high calcium levels in the blood. It may also be used with chemotherapy to treat weakened bones caused by cancer. It works by slowing down the release of calcium from bones. This lowers calcium levels in your blood. It also makes your bones stronger and less likely to break (fracture). It belongs to a group of medications called bisphosphonates. This medicine may be used for other purposes; ask your health care provider or pharmacist if you have questions. COMMON BRAND NAME(S): Zometa, Zometa Powder What should I tell my care team before I take this medication? They need to know if you have any of these conditions: Dehydration Dental disease Kidney disease Liver disease Low levels of calcium in the blood Lung or breathing disease, such as asthma Receiving steroids, such as dexamethasone or prednisone An unusual or allergic reaction to zoledronic acid, other medications, foods, dyes, or preservatives Pregnant or trying to get pregnant Breast-feeding How should I use this medication? This medication is injected into a vein. It is given by your care team in a hospital or clinic setting. Talk to your care team about the use of this medication in children. Special care may be needed. Overdosage: If you think you have taken too much of this medicine contact a poison control center or emergency room at once. NOTE: This medicine is only for you. Do not share this medicine with others. What if I miss a dose? Keep appointments for follow-up doses. It is important not to miss your dose. Call your care team if you are unable to keep an appointment. What may interact with this medication? Certain antibiotics given by injection Diuretics, such as bumetanide, furosemide NSAIDs, medications for pain and inflammation, such as ibuprofen or naproxen Teriparatide Thalidomide This list may not describe all  possible interactions. Give your health care provider a list of all the medicines, herbs, non-prescription drugs, or dietary supplements you use. Also tell them if you smoke, drink alcohol, or use illegal drugs. Some items may interact with your medicine. What should I watch for while using this medication? Visit your care team for regular checks on your progress. It may be some time before you see the benefit from this medication. Some people who take this medication have severe bone, joint, or muscle pain. This medication may also increase your risk for jaw problems or a broken thigh bone. Tell your care team right away if you have severe pain in your jaw, bones, joints, or muscles. Tell you care team if you have any pain that does not go away or that gets worse. Tell your dentist and dental surgeon that you are taking this medication. You should not have major dental surgery while on this medication. See your dentist to have a dental exam and fix any dental problems before starting this medication. Take good care of your teeth while on this medication. Make sure you see your dentist for regular follow-up appointments. You should make sure you get enough calcium and vitamin D while you are taking this medication. Discuss the foods you eat and the vitamins you take with your care team. Check with your care team if you have severe diarrhea, nausea, and vomiting, or if you sweat a lot. The loss of too much body fluid may make it dangerous for you to take this medication. You may need bloodwork while taking this medication. Talk to your care team if you wish to become  pregnant or think you might be pregnant. This medication can cause serious birth defects. What side effects may I notice from receiving this medication? Side effects that you should report to your care team as soon as possible: Allergic reactions--skin rash, itching, hives, swelling of the face, lips, tongue, or throat Kidney injury--decrease  in the amount of urine, swelling of the ankles, hands, or feet Low calcium level--muscle pain or cramps, confusion, tingling, or numbness in the hands or feet Osteonecrosis of the jaw--pain, swelling, or redness in the mouth, numbness of the jaw, poor healing after dental work, unusual discharge from the mouth, visible bones in the mouth Severe bone, joint, or muscle pain Side effects that usually do not require medical attention (report to your care team if they continue or are bothersome): Constipation Fatigue Fever Loss of appetite Nausea Stomach pain This list may not describe all possible side effects. Call your doctor for medical advice about side effects. You may report side effects to FDA at 1-800-FDA-1088. Where should I keep my medication? This medication is given in a hospital or clinic. It will not be stored at home. NOTE: This sheet is a summary. It may not cover all possible information. If you have questions about this medicine, talk to your doctor, pharmacist, or health care provider.  2023 Elsevier/Gold Standard (2007-09-11 00:00:00)

## 2022-10-22 NOTE — Progress Notes (Signed)
[facility] [phone]   [phone]  OFFICE PROGRESS NOTE  Wenda Low, MD [address] [place] [address]  DIAGNOSIS: Multiple myeloma diagnosed in November 2015.  PRIOR THERAPY: 1) Status post Thoracic five laminectomy for epidural tumor resection, Thoracic 4-7 posterior lateral arthrodesis,   segmental pedicle screw fixation T4-T7 Globus instrumentation under the care of Dr. Christella Noa on 06/18/2014. 2) Systemic chemotherapy with Velcade 1.3 MG/M2 subcutaneously weekly in addition to Decadron 40 mg by mouth on weekly basis. Status post 11 cycles. 3) palliative radiotherapy to the right proximal humerus, shoulder and scapula under the care of Dr. Lisbeth Renshaw completed on 12/12/2014. 4)  Systemic chemotherapy with Velcade 1.3 MG/M2 subcutaneously weekly, Decadron 40 mg by mouth weekly in addition to Revlimid 25 MG by mouth daily for 21 days every 4 weeks, status post 14 cycles.   CURRENT THERAPY: Zometa 4 mg IV every 12 weeks. First dose was given 11/01/2014.  This is changed to every 6 months starting October 22, 2022.  INTERVAL HISTORY: Jeremiah Owens 89 y.o. male returns to the clinic today for follow-up visit accompanied by his wife.  The patient has a fall in August and he broke several ribs and he is unable to walk recently.  He came on a wheelchair.  He also has significant hearing issues.  He denied having any current chest pain, shortness of breath, cough or hemoptysis.  He has no nausea, vomiting, diarrhea or constipation.  He has no headache or visual changes.  He is here today for evaluation and repeat blood work as well as Zometa infusion.  MEDICAL HISTORY: Past Medical History:  Diagnosis Date   Allergy    Anxiety    Bone cancer ([place])    t_spine T-5   Compression fracture    Encounter for antineoplastic chemotherapy 01/31/2015   Hearing loss    Melanoma ([place])    Multiple myeloma ([place])    Prostate cancer ([place]) 2002    S/P radiation therapy 08/21/14-09/04/14   T4-6 25Gy/57fx   S/P radiation therapy 11/29/14-12/12/14   rt prox humerus/shoulder/scapula 25Gy/24fx   Skin cancer 1994   melanoma  right neck    Stroke ([place])    Thyroid disease     ALLERGIES:  is allergic to iodine.  MEDICATIONS:  Current Outpatient Medications  Medication Sig Dispense Refill   aspirin 81 MG EC tablet Take 81 mg by mouth daily.  0   Calcium Carbonate-Vitamin D (CALTRATE 600+D PO) Take 600 mg by mouth 2 (two) times daily.     cholecalciferol (VITAMIN D) 1000 UNITS tablet Take 1,000 Units by mouth at bedtime.      furosemide (LASIX) 20 MG tablet Take 20 mg by mouth as directed. Take 1 tablets by mouth every, Wednesday, Friday, Saturday, Sunday and 2 tablets by mouth Tuesday and Thursday     hydrocortisone 2.5 % lotion Apply 1 application  topically at bedtime as needed (apply to redness.rash daily prn).     KLOR-CON 10 10 MEQ tablet Take 10 mEq by mouth daily.     levothyroxine (SYNTHROID) 125 MCG tablet 1 tablet in the morning on an empty stomach     Multiple Vitamins-Minerals (CENTRUM SILVER PO) Take 1 tablet by mouth daily.     Nutritional Supplement LIQD Take 1 each by mouth daily. Med Pass 2.0     omeprazole (PRILOSEC) 40 MG capsule Take 40 mg by mouth daily as needed (heartburn).  saccharomyces boulardii (FLORASTOR) 250 MG capsule Take 250 mg by mouth daily.     tamsulosin (FLOMAX) 0.4 MG CAPS capsule Take 1 capsule (0.4 mg total) by mouth at bedtime. 30 capsule 3   TEXACORT 2.5 % SOLN Apply 1 Application topically daily as needed (for scalp).  2   No current facility-administered medications for this visit.   Facility-Administered Medications Ordered in Other Visits  Medication Dose Route Frequency Provider Last Rate Last Admin   sodium chloride 0.9 % injection 10 mL  10 mL Intracatheter PRN Curt Bears, MD        SURGICAL HISTORY:  Past Surgical History:  Procedure Laterality Date   HERNIA REPAIR     IR  THORACENTESIS ASP PLEURAL SPACE W/IMG GUIDE  07/07/2022   POSTERIOR CERVICAL FUSION/FORAMINOTOMY N/A 06/17/2014   Procedure: Thoracic five laminectomy for epidural tumor resection Thoracic 4-7 posterior lateral arthrodesis, segmental pedicle screw fixation.;  Surgeon: Ashok Pall, MD;  Location: [facility];  Service: Neurosurgery;  Laterality: N/A;    REVIEW OF SYSTEMS:  A comprehensive review of systems was negative except for: Constitutional: positive for fatigue Ears, nose, mouth, throat, and face: positive for hearing loss Musculoskeletal: positive for arthralgias and muscle weakness   PHYSICAL EXAMINATION: General appearance: alert, cooperative, fatigued, and no distress Head: Normocephalic, without obvious abnormality, atraumatic Neck: no adenopathy, no JVD, supple, symmetrical, trachea midline, and thyroid not enlarged, symmetric, no tenderness/mass/nodules Lymph nodes: Cervical, supraclavicular, and axillary nodes normal. Resp: clear to auscultation bilaterally Back: symmetric, no curvature. ROM normal. No CVA tenderness. Cardio: regular rate and rhythm, S1, S2 normal, no murmur, click, rub or gallop GI: soft, non-tender; bowel sounds normal; no masses,  no organomegaly Extremities: extremities normal, atraumatic, no cyanosis or edema  ECOG PERFORMANCE STATUS: 1 - Symptomatic but completely ambulatory  Blood pressure 109/60, pulse 86, resp. rate 17, SpO2 100 %.  LABORATORY DATA: Lab Results  Component Value Date   WBC 6.1 07/06/2022   HGB 11.1 (L) 07/06/2022   HCT 33.0 (L) 07/06/2022   MCV 83.8 07/06/2022   PLT 237 07/06/2022      Chemistry      Component Value Date/Time   NA 140 07/06/2022 0301   NA 141 07/15/2017 0949   K 3.5 07/06/2022 0301   K 4.4 07/15/2017 0949   CL 106 07/06/2022 0301   CO2 23 07/06/2022 0301   CO2 27 07/15/2017 0949   BUN 26 (H) 07/06/2022 0301   BUN 18.0 07/15/2017 0949   CREATININE 1.08 07/06/2022 0301   CREATININE 1.18 04/16/2022  1036   CREATININE 1.1 07/15/2017 0949      Component Value Date/Time   CALCIUM 8.6 (L) 07/06/2022 0301   CALCIUM 9.2 07/15/2017 0949   ALKPHOS 82 07/04/2022 1448   ALKPHOS 56 07/15/2017 0949   AST 33 07/04/2022 1448   AST 24 04/16/2022 1036   AST 20 07/15/2017 0949   ALT 30 07/04/2022 1448   ALT 19 04/16/2022 1036   ALT 16 07/15/2017 0949   BILITOT 0.5 07/04/2022 1448   BILITOT 0.5 04/16/2022 1036   BILITOT 0.61 07/15/2017 0949       RADIOGRAPHIC STUDIES: No results found.  ASSESSMENT AND PLAN:  This is a very pleasant 89 years old white male with history of multiple myeloma diagnosed in November 2015 status post several chemotherapy regimens as well as radiation. The patient has been in observation for more than 8 years now. The patient had repeat myeloma panel performed earlier today but the results  are still pending. He is currently on Zometa infusion for his bone disease and he will proceed with infusion today as planned.  I will change the frequency of his infusion to every 6 months to be done with the lab work and follow-up visit. He was advised to call immediately if he has any other concerning symptoms in the interval. The patient voices understanding of current disease status and treatment options and is in agreement with the current care plan.  All questions were answered. The patient knows to call the clinic with any problems, questions or concerns. We can certainly see the patient much sooner if necessary.  Disclaimer: This note was dictated with voice recognition software. Similar sounding words can inadvertently be transcribed and may not be corrected upon review.

## 2022-10-22 NOTE — Progress Notes (Signed)
Patient seen by MD today  Vitals are within treatment parameters.  Labs reviewed: and are within treatment parameters.  Per physician team, patient is ready for treatment and there are NO modifications to the treatment plan.  [facility]

## 2022-10-23 DIAGNOSIS — J479 Bronchiectasis, uncomplicated: Secondary | ICD-10-CM | POA: Diagnosis not present

## 2022-10-23 DIAGNOSIS — C7951 Secondary malignant neoplasm of bone: Secondary | ICD-10-CM | POA: Diagnosis not present

## 2022-10-23 DIAGNOSIS — I69341 Monoplegia of lower limb following cerebral infarction affecting right dominant side: Secondary | ICD-10-CM | POA: Diagnosis not present

## 2022-10-23 DIAGNOSIS — D630 Anemia in neoplastic disease: Secondary | ICD-10-CM | POA: Diagnosis not present

## 2022-10-23 DIAGNOSIS — G893 Neoplasm related pain (acute) (chronic): Secondary | ICD-10-CM | POA: Diagnosis not present

## 2022-10-23 DIAGNOSIS — C9000 Multiple myeloma not having achieved remission: Secondary | ICD-10-CM | POA: Diagnosis not present

## 2022-10-23 LAB — KAPPA/LAMBDA LIGHT CHAINS
Kappa free light chain: 38.6 mg/L — ABNORMAL HIGH (ref 3.3–19.4)
Kappa, lambda light chain ratio: 1.8 — ABNORMAL HIGH (ref 0.26–1.65)
Lambda free light chains: 21.5 mg/L (ref 5.7–26.3)

## 2022-10-23 LAB — IGG, IGA, IGM
IgA: 227 mg/dL (ref 61–437)
IgG (Immunoglobin G), Serum: 1256 mg/dL (ref 603–1613)
IgM (Immunoglobulin M), Srm: 82 mg/dL (ref 15–143)

## 2022-10-23 LAB — BETA 2 MICROGLOBULIN, SERUM: Beta-2 Microglobulin: 3.1 mg/L — ABNORMAL HIGH (ref 0.6–2.4)

## 2022-10-24 ENCOUNTER — Encounter: Payer: Self-pay | Admitting: Internal Medicine

## 2022-10-27 DIAGNOSIS — G893 Neoplasm related pain (acute) (chronic): Secondary | ICD-10-CM | POA: Diagnosis not present

## 2022-10-27 DIAGNOSIS — I69341 Monoplegia of lower limb following cerebral infarction affecting right dominant side: Secondary | ICD-10-CM | POA: Diagnosis not present

## 2022-10-27 DIAGNOSIS — D630 Anemia in neoplastic disease: Secondary | ICD-10-CM | POA: Diagnosis not present

## 2022-10-27 DIAGNOSIS — C7951 Secondary malignant neoplasm of bone: Secondary | ICD-10-CM | POA: Diagnosis not present

## 2022-10-27 DIAGNOSIS — J479 Bronchiectasis, uncomplicated: Secondary | ICD-10-CM | POA: Diagnosis not present

## 2022-10-27 DIAGNOSIS — C9000 Multiple myeloma not having achieved remission: Secondary | ICD-10-CM | POA: Diagnosis not present

## 2022-10-29 DIAGNOSIS — I69341 Monoplegia of lower limb following cerebral infarction affecting right dominant side: Secondary | ICD-10-CM | POA: Diagnosis not present

## 2022-10-29 DIAGNOSIS — C7951 Secondary malignant neoplasm of bone: Secondary | ICD-10-CM | POA: Diagnosis not present

## 2022-10-29 DIAGNOSIS — J479 Bronchiectasis, uncomplicated: Secondary | ICD-10-CM | POA: Diagnosis not present

## 2022-10-29 DIAGNOSIS — C9000 Multiple myeloma not having achieved remission: Secondary | ICD-10-CM | POA: Diagnosis not present

## 2022-10-29 DIAGNOSIS — G893 Neoplasm related pain (acute) (chronic): Secondary | ICD-10-CM | POA: Diagnosis not present

## 2022-10-29 DIAGNOSIS — D630 Anemia in neoplastic disease: Secondary | ICD-10-CM | POA: Diagnosis not present

## 2022-10-30 DIAGNOSIS — C7951 Secondary malignant neoplasm of bone: Secondary | ICD-10-CM | POA: Diagnosis not present

## 2022-10-30 DIAGNOSIS — I69341 Monoplegia of lower limb following cerebral infarction affecting right dominant side: Secondary | ICD-10-CM | POA: Diagnosis not present

## 2022-10-30 DIAGNOSIS — C9000 Multiple myeloma not having achieved remission: Secondary | ICD-10-CM | POA: Diagnosis not present

## 2022-10-30 DIAGNOSIS — G893 Neoplasm related pain (acute) (chronic): Secondary | ICD-10-CM | POA: Diagnosis not present

## 2022-10-30 DIAGNOSIS — D630 Anemia in neoplastic disease: Secondary | ICD-10-CM | POA: Diagnosis not present

## 2022-10-30 DIAGNOSIS — J479 Bronchiectasis, uncomplicated: Secondary | ICD-10-CM | POA: Diagnosis not present

## 2022-11-03 DIAGNOSIS — G893 Neoplasm related pain (acute) (chronic): Secondary | ICD-10-CM | POA: Diagnosis not present

## 2022-11-03 DIAGNOSIS — J479 Bronchiectasis, uncomplicated: Secondary | ICD-10-CM | POA: Diagnosis not present

## 2022-11-03 DIAGNOSIS — C7951 Secondary malignant neoplasm of bone: Secondary | ICD-10-CM | POA: Diagnosis not present

## 2022-11-03 DIAGNOSIS — I69341 Monoplegia of lower limb following cerebral infarction affecting right dominant side: Secondary | ICD-10-CM | POA: Diagnosis not present

## 2022-11-03 DIAGNOSIS — C9000 Multiple myeloma not having achieved remission: Secondary | ICD-10-CM | POA: Diagnosis not present

## 2022-11-03 DIAGNOSIS — D630 Anemia in neoplastic disease: Secondary | ICD-10-CM | POA: Diagnosis not present

## 2022-11-04 ENCOUNTER — Other Ambulatory Visit: Payer: Medicare Other

## 2022-11-05 ENCOUNTER — Other Ambulatory Visit: Payer: Medicare Other

## 2022-11-05 VITALS — BP 106/72 | HR 72 | Resp 18

## 2022-11-05 DIAGNOSIS — J479 Bronchiectasis, uncomplicated: Secondary | ICD-10-CM | POA: Diagnosis not present

## 2022-11-05 DIAGNOSIS — C9000 Multiple myeloma not having achieved remission: Secondary | ICD-10-CM | POA: Diagnosis not present

## 2022-11-05 DIAGNOSIS — G893 Neoplasm related pain (acute) (chronic): Secondary | ICD-10-CM | POA: Diagnosis not present

## 2022-11-05 DIAGNOSIS — D630 Anemia in neoplastic disease: Secondary | ICD-10-CM | POA: Diagnosis not present

## 2022-11-05 DIAGNOSIS — C7951 Secondary malignant neoplasm of bone: Secondary | ICD-10-CM | POA: Diagnosis not present

## 2022-11-05 DIAGNOSIS — Z515 Encounter for palliative care: Secondary | ICD-10-CM

## 2022-11-05 DIAGNOSIS — I69341 Monoplegia of lower limb following cerebral infarction affecting right dominant side: Secondary | ICD-10-CM | POA: Diagnosis not present

## 2022-11-06 DIAGNOSIS — C7951 Secondary malignant neoplasm of bone: Secondary | ICD-10-CM | POA: Diagnosis not present

## 2022-11-06 DIAGNOSIS — D630 Anemia in neoplastic disease: Secondary | ICD-10-CM | POA: Diagnosis not present

## 2022-11-06 DIAGNOSIS — J479 Bronchiectasis, uncomplicated: Secondary | ICD-10-CM | POA: Diagnosis not present

## 2022-11-06 DIAGNOSIS — I69341 Monoplegia of lower limb following cerebral infarction affecting right dominant side: Secondary | ICD-10-CM | POA: Diagnosis not present

## 2022-11-06 DIAGNOSIS — C9000 Multiple myeloma not having achieved remission: Secondary | ICD-10-CM | POA: Diagnosis not present

## 2022-11-06 DIAGNOSIS — G893 Neoplasm related pain (acute) (chronic): Secondary | ICD-10-CM | POA: Diagnosis not present

## 2022-11-10 DIAGNOSIS — I69341 Monoplegia of lower limb following cerebral infarction affecting right dominant side: Secondary | ICD-10-CM | POA: Diagnosis not present

## 2022-11-10 DIAGNOSIS — C9000 Multiple myeloma not having achieved remission: Secondary | ICD-10-CM | POA: Diagnosis not present

## 2022-11-10 DIAGNOSIS — C7951 Secondary malignant neoplasm of bone: Secondary | ICD-10-CM | POA: Diagnosis not present

## 2022-11-10 DIAGNOSIS — G893 Neoplasm related pain (acute) (chronic): Secondary | ICD-10-CM | POA: Diagnosis not present

## 2022-11-10 DIAGNOSIS — J479 Bronchiectasis, uncomplicated: Secondary | ICD-10-CM | POA: Diagnosis not present

## 2022-11-10 DIAGNOSIS — D630 Anemia in neoplastic disease: Secondary | ICD-10-CM | POA: Diagnosis not present

## 2022-11-10 NOTE — Progress Notes (Signed)
1343 Palliative Care Initial / Follow Up Encounter Note  PATIENT NAME: Jeremiah Owens DOB: [birth year] MRN: [id number]  PRIMARY CARE PROVIDER: Georgann Housekeeper, MD  RESPONSIBLE PARTY:  Acct ID - Guarantor Home Phone Work Phone Relationship Acct Type  [id number] GAGAN, MCELHONE* [phone]  Self P/F     [address], [place], [place] [address]   RN completed home visit. Wife also present    HISTORY OF PRESENT ILLNESS:  89 y.o. year old male with multiple myeloma with metastasis to spinal column, hx of CVA with residual right leg weakness and dysphagia/aphasia/dysarthria, gait disorder, aortic stenosis, constipation and hx of aspiration pneumonia and pleural effusion on the right side. He also has hx of prostate cancer, stage 3 CKD, hypothyroidism and anemia. He has previously had radiation in 2016.   Socially: Lives with wife in single story home for the last 20 years. Son and DIL live in [place]. Retired from [employer]. Has paid caregivers 830-130 and then 330p-730p.   Cognitive: Pt is hard of hearing. Wears hearing aids and glasses.   Appetite: Good with no problems   Mobility: Pt currently sitting in wheelchair. Uses walker and can walk 70 feet with assist of physical therapy.    Sleeping Pattern: Goes to bed fairly early but awakens early too. Not napping much during the day.  Pain: Denies any pain at present. Just reports discomfort and pain to left hand when working with occupational therapy to grasp and make fist. Occupational therapy is coming out to work with him multiple times a week.  Gi/GU: Wears depends full time. OT is working with him on using the bathroom. Has some nerve damage from thoracic surgery 8 years ago, so he doesn't always "feel" when he has to go.   CODE STATUS: DNR ADVANCED DIRECTIVES: N MOST FORM: Yes (found in Epic) PPS:   Next appt scheduled: 12/01/22 at 1330   PHYSICAL EXAM:   VITALS: Today's Vitals   11/04/22 1342  BP: 106/72  Pulse: 72   Resp: 18  SpO2: 94%  PainSc: 0-No pain    LUNGS: decreased breath sounds CARDIAC: Cor RRR}, no JVD EXTREMITIES: MAE x 4, weakness noted in left upper ext. Wearing a compression glove SKIN: Skin color, texture, turgor normal. No rashes or lesions or normal and no edema  NEURO: A&O x 4. Particpates in convo.    Barbette Merino, RN

## 2022-11-12 DIAGNOSIS — C7951 Secondary malignant neoplasm of bone: Secondary | ICD-10-CM | POA: Diagnosis not present

## 2022-11-12 DIAGNOSIS — C9000 Multiple myeloma not having achieved remission: Secondary | ICD-10-CM | POA: Diagnosis not present

## 2022-11-12 DIAGNOSIS — I69341 Monoplegia of lower limb following cerebral infarction affecting right dominant side: Secondary | ICD-10-CM | POA: Diagnosis not present

## 2022-11-12 DIAGNOSIS — J479 Bronchiectasis, uncomplicated: Secondary | ICD-10-CM | POA: Diagnosis not present

## 2022-11-12 DIAGNOSIS — G893 Neoplasm related pain (acute) (chronic): Secondary | ICD-10-CM | POA: Diagnosis not present

## 2022-11-12 DIAGNOSIS — D630 Anemia in neoplastic disease: Secondary | ICD-10-CM | POA: Diagnosis not present

## 2022-11-13 DIAGNOSIS — I69341 Monoplegia of lower limb following cerebral infarction affecting right dominant side: Secondary | ICD-10-CM | POA: Diagnosis not present

## 2022-11-13 DIAGNOSIS — G893 Neoplasm related pain (acute) (chronic): Secondary | ICD-10-CM | POA: Diagnosis not present

## 2022-11-13 DIAGNOSIS — C9000 Multiple myeloma not having achieved remission: Secondary | ICD-10-CM | POA: Diagnosis not present

## 2022-11-13 DIAGNOSIS — C7951 Secondary malignant neoplasm of bone: Secondary | ICD-10-CM | POA: Diagnosis not present

## 2022-11-13 DIAGNOSIS — J479 Bronchiectasis, uncomplicated: Secondary | ICD-10-CM | POA: Diagnosis not present

## 2022-11-13 DIAGNOSIS — D630 Anemia in neoplastic disease: Secondary | ICD-10-CM | POA: Diagnosis not present

## 2022-11-17 DIAGNOSIS — D630 Anemia in neoplastic disease: Secondary | ICD-10-CM | POA: Diagnosis not present

## 2022-11-17 DIAGNOSIS — I69341 Monoplegia of lower limb following cerebral infarction affecting right dominant side: Secondary | ICD-10-CM | POA: Diagnosis not present

## 2022-11-17 DIAGNOSIS — C9000 Multiple myeloma not having achieved remission: Secondary | ICD-10-CM | POA: Diagnosis not present

## 2022-11-17 DIAGNOSIS — C7951 Secondary malignant neoplasm of bone: Secondary | ICD-10-CM | POA: Diagnosis not present

## 2022-11-17 DIAGNOSIS — J479 Bronchiectasis, uncomplicated: Secondary | ICD-10-CM | POA: Diagnosis not present

## 2022-11-17 DIAGNOSIS — G893 Neoplasm related pain (acute) (chronic): Secondary | ICD-10-CM | POA: Diagnosis not present

## 2022-11-19 DIAGNOSIS — C7951 Secondary malignant neoplasm of bone: Secondary | ICD-10-CM | POA: Diagnosis not present

## 2022-11-19 DIAGNOSIS — C9000 Multiple myeloma not having achieved remission: Secondary | ICD-10-CM | POA: Diagnosis not present

## 2022-11-19 DIAGNOSIS — J479 Bronchiectasis, uncomplicated: Secondary | ICD-10-CM | POA: Diagnosis not present

## 2022-11-19 DIAGNOSIS — G893 Neoplasm related pain (acute) (chronic): Secondary | ICD-10-CM | POA: Diagnosis not present

## 2022-11-19 DIAGNOSIS — D630 Anemia in neoplastic disease: Secondary | ICD-10-CM | POA: Diagnosis not present

## 2022-11-19 DIAGNOSIS — I69341 Monoplegia of lower limb following cerebral infarction affecting right dominant side: Secondary | ICD-10-CM | POA: Diagnosis not present

## 2022-11-20 DIAGNOSIS — C7951 Secondary malignant neoplasm of bone: Secondary | ICD-10-CM | POA: Diagnosis not present

## 2022-11-20 DIAGNOSIS — D630 Anemia in neoplastic disease: Secondary | ICD-10-CM | POA: Diagnosis not present

## 2022-11-20 DIAGNOSIS — J479 Bronchiectasis, uncomplicated: Secondary | ICD-10-CM | POA: Diagnosis not present

## 2022-11-20 DIAGNOSIS — C9000 Multiple myeloma not having achieved remission: Secondary | ICD-10-CM | POA: Diagnosis not present

## 2022-11-20 DIAGNOSIS — I69341 Monoplegia of lower limb following cerebral infarction affecting right dominant side: Secondary | ICD-10-CM | POA: Diagnosis not present

## 2022-11-20 DIAGNOSIS — G893 Neoplasm related pain (acute) (chronic): Secondary | ICD-10-CM | POA: Diagnosis not present

## 2022-11-21 DIAGNOSIS — Z7982 Long term (current) use of aspirin: Secondary | ICD-10-CM | POA: Diagnosis not present

## 2022-11-21 DIAGNOSIS — H9190 Unspecified hearing loss, unspecified ear: Secondary | ICD-10-CM | POA: Diagnosis not present

## 2022-11-21 DIAGNOSIS — M40204 Unspecified kyphosis, thoracic region: Secondary | ICD-10-CM | POA: Diagnosis not present

## 2022-11-21 DIAGNOSIS — I69398 Other sequelae of cerebral infarction: Secondary | ICD-10-CM | POA: Diagnosis not present

## 2022-11-21 DIAGNOSIS — J479 Bronchiectasis, uncomplicated: Secondary | ICD-10-CM | POA: Diagnosis not present

## 2022-11-21 DIAGNOSIS — G893 Neoplasm related pain (acute) (chronic): Secondary | ICD-10-CM | POA: Diagnosis not present

## 2022-11-21 DIAGNOSIS — I69391 Dysphagia following cerebral infarction: Secondary | ICD-10-CM | POA: Diagnosis not present

## 2022-11-21 DIAGNOSIS — D630 Anemia in neoplastic disease: Secondary | ICD-10-CM | POA: Diagnosis not present

## 2022-11-21 DIAGNOSIS — R29898 Other symptoms and signs involving the musculoskeletal system: Secondary | ICD-10-CM | POA: Diagnosis not present

## 2022-11-21 DIAGNOSIS — E039 Hypothyroidism, unspecified: Secondary | ICD-10-CM | POA: Diagnosis not present

## 2022-11-21 DIAGNOSIS — M67912 Unspecified disorder of synovium and tendon, left shoulder: Secondary | ICD-10-CM | POA: Diagnosis not present

## 2022-11-21 DIAGNOSIS — R1312 Dysphagia, oropharyngeal phase: Secondary | ICD-10-CM | POA: Diagnosis not present

## 2022-11-21 DIAGNOSIS — C9000 Multiple myeloma not having achieved remission: Secondary | ICD-10-CM | POA: Diagnosis not present

## 2022-11-21 DIAGNOSIS — N400 Enlarged prostate without lower urinary tract symptoms: Secondary | ICD-10-CM | POA: Diagnosis not present

## 2022-11-21 DIAGNOSIS — C7951 Secondary malignant neoplasm of bone: Secondary | ICD-10-CM | POA: Diagnosis not present

## 2022-11-21 DIAGNOSIS — K219 Gastro-esophageal reflux disease without esophagitis: Secondary | ICD-10-CM | POA: Diagnosis not present

## 2022-11-21 DIAGNOSIS — I350 Nonrheumatic aortic (valve) stenosis: Secondary | ICD-10-CM | POA: Diagnosis not present

## 2022-11-21 DIAGNOSIS — G8220 Paraplegia, unspecified: Secondary | ICD-10-CM | POA: Diagnosis not present

## 2022-11-21 DIAGNOSIS — I700 Atherosclerosis of aorta: Secondary | ICD-10-CM | POA: Diagnosis not present

## 2022-11-21 DIAGNOSIS — I69320 Aphasia following cerebral infarction: Secondary | ICD-10-CM | POA: Diagnosis not present

## 2022-11-21 DIAGNOSIS — I69341 Monoplegia of lower limb following cerebral infarction affecting right dominant side: Secondary | ICD-10-CM | POA: Diagnosis not present

## 2022-11-21 DIAGNOSIS — I69322 Dysarthria following cerebral infarction: Secondary | ICD-10-CM | POA: Diagnosis not present

## 2022-11-21 DIAGNOSIS — N1831 Chronic kidney disease, stage 3a: Secondary | ICD-10-CM | POA: Diagnosis not present

## 2022-11-21 DIAGNOSIS — F419 Anxiety disorder, unspecified: Secondary | ICD-10-CM | POA: Diagnosis not present

## 2022-11-21 DIAGNOSIS — K5900 Constipation, unspecified: Secondary | ICD-10-CM | POA: Diagnosis not present

## 2022-11-26 DIAGNOSIS — C9000 Multiple myeloma not having achieved remission: Secondary | ICD-10-CM | POA: Diagnosis not present

## 2022-11-26 DIAGNOSIS — J479 Bronchiectasis, uncomplicated: Secondary | ICD-10-CM | POA: Diagnosis not present

## 2022-11-26 DIAGNOSIS — C7951 Secondary malignant neoplasm of bone: Secondary | ICD-10-CM | POA: Diagnosis not present

## 2022-11-26 DIAGNOSIS — G893 Neoplasm related pain (acute) (chronic): Secondary | ICD-10-CM | POA: Diagnosis not present

## 2022-11-26 DIAGNOSIS — I69341 Monoplegia of lower limb following cerebral infarction affecting right dominant side: Secondary | ICD-10-CM | POA: Diagnosis not present

## 2022-11-26 DIAGNOSIS — D630 Anemia in neoplastic disease: Secondary | ICD-10-CM | POA: Diagnosis not present

## 2022-11-27 DIAGNOSIS — C7951 Secondary malignant neoplasm of bone: Secondary | ICD-10-CM | POA: Diagnosis not present

## 2022-11-27 DIAGNOSIS — J479 Bronchiectasis, uncomplicated: Secondary | ICD-10-CM | POA: Diagnosis not present

## 2022-11-27 DIAGNOSIS — C9000 Multiple myeloma not having achieved remission: Secondary | ICD-10-CM | POA: Diagnosis not present

## 2022-11-27 DIAGNOSIS — D630 Anemia in neoplastic disease: Secondary | ICD-10-CM | POA: Diagnosis not present

## 2022-11-27 DIAGNOSIS — I69341 Monoplegia of lower limb following cerebral infarction affecting right dominant side: Secondary | ICD-10-CM | POA: Diagnosis not present

## 2022-11-27 DIAGNOSIS — G893 Neoplasm related pain (acute) (chronic): Secondary | ICD-10-CM | POA: Diagnosis not present

## 2022-12-01 ENCOUNTER — Other Ambulatory Visit: Payer: Medicare Other

## 2022-12-01 VITALS — BP 110/64 | HR 90 | Temp 96.8°F

## 2022-12-01 DIAGNOSIS — Z515 Encounter for palliative care: Secondary | ICD-10-CM

## 2022-12-01 NOTE — Progress Notes (Unsigned)
PATIENT NAME: Jeremiah Owens DOB: 05/18/1930 MRN: [id number]  PRIMARY CARE PROVIDER: Georgann Housekeeper, MD  RESPONSIBLE PARTY:  Acct ID - Guarantor Home Phone Work Phone Relationship Acct Type  [id number] [name], [name] [phone]  Self P/F     [address] [place], [place] [address]   Palliative Care Initial Encounter Note   Completed home visit. [name] also present.      TODAY'S VISIT:  Respiratory: denies SOB; LS - pt has some light crackles on L side;   Cardiac: no edema   Cognitive: alert   Appetite: has dysphagia; has a chopped diet; crushes his meds and given with Activia yogurt; 3 reduced portion meals; not much snacking; drinks about 24 oz of fluid daily; encouraged wife to offer pt more thickened fluids throughout the day  GI/GU: has a BM daily; wife reports caregiver told her his urine is odorous; wife also felt that it was odorous this past Sunday; pt is incontinent of bowel and bladder and wears an adult brief   Mobility: currently working with PT/OT to stand, pivot and transfer to a car; OT will train caregiver to assist with car transfers  ADLs: dependent for personal care; can feed self and needs assist for brushing his teeth   Sleeping Pattern: sleeps throughout the night when he gets to sleep; pt gets 8+ hrs of sleep  Pain: denies pain at this time  Palliative Care/ Hospice: LPN explained role and purpose of palliative care including visit frequency. Also discussed benefits of hospice care as well as the differences between the two with patient.   Goals of Care: To stay in the home with his wife and cared for by paid caregivers    Next Appt Scheduled For: 12/19/22 @ 10:30am      CODE STATUS: DNR ADVANCED DIRECTIVES: N MOST FORM: Y PPS: 40%   PHYSICAL EXAM:   VITALS: Today's Vitals   12/01/22 1336  BP: 110/64  Pulse: 90  Temp: (!) 96.8 F (36 C)  TempSrc: Temporal  SpO2: 98%  PainSc: 0-No pain    LUNGS: clear via auscultation;  rales on the L side CARDIAC: Cor RRR EXTREMITIES: no edema SKIN: L hand noted with a red area between the thumb and forefinger; area has a scab NEURO: negative except for gait problems, speech problems, and weakness   Sent email to Dr Donette Larry r/t crackles to L lung, wife reports odorous urine and pt not eating as much as usual    Zykeem Bauserman Clementeen Graham, LPN

## 2022-12-03 DIAGNOSIS — D630 Anemia in neoplastic disease: Secondary | ICD-10-CM | POA: Diagnosis not present

## 2022-12-03 DIAGNOSIS — I69341 Monoplegia of lower limb following cerebral infarction affecting right dominant side: Secondary | ICD-10-CM | POA: Diagnosis not present

## 2022-12-03 DIAGNOSIS — C7951 Secondary malignant neoplasm of bone: Secondary | ICD-10-CM | POA: Diagnosis not present

## 2022-12-03 DIAGNOSIS — C9000 Multiple myeloma not having achieved remission: Secondary | ICD-10-CM | POA: Diagnosis not present

## 2022-12-03 DIAGNOSIS — J479 Bronchiectasis, uncomplicated: Secondary | ICD-10-CM | POA: Diagnosis not present

## 2022-12-03 DIAGNOSIS — G893 Neoplasm related pain (acute) (chronic): Secondary | ICD-10-CM | POA: Diagnosis not present

## 2022-12-04 DIAGNOSIS — I69341 Monoplegia of lower limb following cerebral infarction affecting right dominant side: Secondary | ICD-10-CM | POA: Diagnosis not present

## 2022-12-04 DIAGNOSIS — D630 Anemia in neoplastic disease: Secondary | ICD-10-CM | POA: Diagnosis not present

## 2022-12-04 DIAGNOSIS — C9000 Multiple myeloma not having achieved remission: Secondary | ICD-10-CM | POA: Diagnosis not present

## 2022-12-04 DIAGNOSIS — G893 Neoplasm related pain (acute) (chronic): Secondary | ICD-10-CM | POA: Diagnosis not present

## 2022-12-04 DIAGNOSIS — C7951 Secondary malignant neoplasm of bone: Secondary | ICD-10-CM | POA: Diagnosis not present

## 2022-12-04 DIAGNOSIS — J479 Bronchiectasis, uncomplicated: Secondary | ICD-10-CM | POA: Diagnosis not present

## 2022-12-09 DIAGNOSIS — C7951 Secondary malignant neoplasm of bone: Secondary | ICD-10-CM | POA: Diagnosis not present

## 2022-12-09 DIAGNOSIS — I69341 Monoplegia of lower limb following cerebral infarction affecting right dominant side: Secondary | ICD-10-CM | POA: Diagnosis not present

## 2022-12-09 DIAGNOSIS — J479 Bronchiectasis, uncomplicated: Secondary | ICD-10-CM | POA: Diagnosis not present

## 2022-12-09 DIAGNOSIS — C9000 Multiple myeloma not having achieved remission: Secondary | ICD-10-CM | POA: Diagnosis not present

## 2022-12-09 DIAGNOSIS — D630 Anemia in neoplastic disease: Secondary | ICD-10-CM | POA: Diagnosis not present

## 2022-12-09 DIAGNOSIS — G893 Neoplasm related pain (acute) (chronic): Secondary | ICD-10-CM | POA: Diagnosis not present

## 2022-12-10 DIAGNOSIS — G893 Neoplasm related pain (acute) (chronic): Secondary | ICD-10-CM | POA: Diagnosis not present

## 2022-12-10 DIAGNOSIS — C9000 Multiple myeloma not having achieved remission: Secondary | ICD-10-CM | POA: Diagnosis not present

## 2022-12-10 DIAGNOSIS — C7951 Secondary malignant neoplasm of bone: Secondary | ICD-10-CM | POA: Diagnosis not present

## 2022-12-10 DIAGNOSIS — D630 Anemia in neoplastic disease: Secondary | ICD-10-CM | POA: Diagnosis not present

## 2022-12-10 DIAGNOSIS — J479 Bronchiectasis, uncomplicated: Secondary | ICD-10-CM | POA: Diagnosis not present

## 2022-12-10 DIAGNOSIS — I69341 Monoplegia of lower limb following cerebral infarction affecting right dominant side: Secondary | ICD-10-CM | POA: Diagnosis not present

## 2022-12-15 DIAGNOSIS — R32 Unspecified urinary incontinence: Secondary | ICD-10-CM | POA: Diagnosis not present

## 2022-12-15 DIAGNOSIS — R059 Cough, unspecified: Secondary | ICD-10-CM | POA: Diagnosis not present

## 2022-12-17 DIAGNOSIS — G893 Neoplasm related pain (acute) (chronic): Secondary | ICD-10-CM | POA: Diagnosis not present

## 2022-12-17 DIAGNOSIS — J479 Bronchiectasis, uncomplicated: Secondary | ICD-10-CM | POA: Diagnosis not present

## 2022-12-17 DIAGNOSIS — C9000 Multiple myeloma not having achieved remission: Secondary | ICD-10-CM | POA: Diagnosis not present

## 2022-12-17 DIAGNOSIS — C7951 Secondary malignant neoplasm of bone: Secondary | ICD-10-CM | POA: Diagnosis not present

## 2022-12-17 DIAGNOSIS — D630 Anemia in neoplastic disease: Secondary | ICD-10-CM | POA: Diagnosis not present

## 2022-12-17 DIAGNOSIS — I69341 Monoplegia of lower limb following cerebral infarction affecting right dominant side: Secondary | ICD-10-CM | POA: Diagnosis not present

## 2022-12-18 ENCOUNTER — Ambulatory Visit
Admission: RE | Admit: 2022-12-18 | Discharge: 2022-12-18 | Disposition: A | Discharge status: Home / Self Care | Payer: Medicare Other | Source: Ambulatory Visit | Attending: Internal Medicine | Admitting: Internal Medicine

## 2022-12-18 ENCOUNTER — Other Ambulatory Visit: Payer: Self-pay | Admitting: Internal Medicine

## 2022-12-18 DIAGNOSIS — I69341 Monoplegia of lower limb following cerebral infarction affecting right dominant side: Secondary | ICD-10-CM | POA: Diagnosis not present

## 2022-12-18 DIAGNOSIS — G893 Neoplasm related pain (acute) (chronic): Secondary | ICD-10-CM | POA: Diagnosis not present

## 2022-12-18 DIAGNOSIS — R059 Cough, unspecified: Secondary | ICD-10-CM

## 2022-12-18 DIAGNOSIS — D630 Anemia in neoplastic disease: Secondary | ICD-10-CM | POA: Diagnosis not present

## 2022-12-18 DIAGNOSIS — J479 Bronchiectasis, uncomplicated: Secondary | ICD-10-CM | POA: Diagnosis not present

## 2022-12-18 DIAGNOSIS — R053 Chronic cough: Secondary | ICD-10-CM | POA: Diagnosis not present

## 2022-12-18 DIAGNOSIS — C9000 Multiple myeloma not having achieved remission: Secondary | ICD-10-CM | POA: Diagnosis not present

## 2022-12-18 DIAGNOSIS — C7951 Secondary malignant neoplasm of bone: Secondary | ICD-10-CM | POA: Diagnosis not present

## 2022-12-19 ENCOUNTER — Other Ambulatory Visit: Payer: Medicare Other

## 2022-12-19 VITALS — BP 100/70 | HR 82 | Temp 97.5°F

## 2022-12-19 DIAGNOSIS — C9000 Multiple myeloma not having achieved remission: Secondary | ICD-10-CM | POA: Diagnosis not present

## 2022-12-19 DIAGNOSIS — G893 Neoplasm related pain (acute) (chronic): Secondary | ICD-10-CM | POA: Diagnosis not present

## 2022-12-19 DIAGNOSIS — D630 Anemia in neoplastic disease: Secondary | ICD-10-CM | POA: Diagnosis not present

## 2022-12-19 DIAGNOSIS — I69341 Monoplegia of lower limb following cerebral infarction affecting right dominant side: Secondary | ICD-10-CM | POA: Diagnosis not present

## 2022-12-19 DIAGNOSIS — C7951 Secondary malignant neoplasm of bone: Secondary | ICD-10-CM | POA: Diagnosis not present

## 2022-12-19 DIAGNOSIS — Z515 Encounter for palliative care: Secondary | ICD-10-CM

## 2022-12-19 DIAGNOSIS — J479 Bronchiectasis, uncomplicated: Secondary | ICD-10-CM | POA: Diagnosis not present

## 2022-12-19 NOTE — Progress Notes (Unsigned)
PATIENT NAME: Jeremiah Owens DOB: Mar 03, 1930 MRN: [id number]  PRIMARY CARE PROVIDER: Georgann Housekeeper, MD  RESPONSIBLE PARTY:  Acct ID - Guarantor Home Phone Work Phone Relationship Acct Type  [id number] LENY, ONDER* [phone]  Self P/F     [address] [place], [place] [address]   Palliative Care Follow Up Encounter Note   Completed home visit. Wife and caregiver also present.       TODAY'S VISIT:  Respiratory: denies SOB; LS - pt has crackles on L side and R side is clear; had a CXR at the doctor visit yesterday; results not known at this time   Cardiac: no edema    Cognitive: alert and oriented   Appetite: has dysphagia; has a chopped diet; crushes his meds and given with Activia yogurt; 3 reduced portion meals; not much snacking; wife has tried to increase fluids to about 36 oz daily    GI/GU: has a BM daily; when pt seems constipated his wife will administer a stool softener; pt is incontinent of bowel and bladder and wears an adult brief; pt is now on Nitrofurantoin for 10 days for a UTI   Mobility: still working with PT/OT to stand, pivot and transfer to a car; OT will train caregivers to assist with car transfers because wife doesn't feel comfortable   ADLs: dependent for personal care; can feed self and needs assist with brushing his teeth   Sleeping Pattern: sleeps throughout the night when he gets to sleep; pt gets 8+ hrs of sleep   Pain: denies pain at this time   Palliative Care/ Hospice: LPN explained role and purpose of palliative care including visit frequency. Also discussed benefits of hospice care as well as the differences between the two with patient.   Skin: pt went to the doctor's office yesterday and afterwards when he was getting out of the car, he bumped his arm and now has a skin tear which his wife is treating with Vaseline gauze   Goals of Care: To stay in the home with his wife and cared for by paid caregivers     Next Appt Scheduled  For: 02/04/23 @ 12pm         CODE STATUS: DNR ADVANCED DIRECTIVES: N MOST FORM: Y PPS: 40%   PHYSICAL EXAM:   VITALS: Today's Vitals   12/19/22 1057  BP: 100/70  Pulse: 82  Temp: (!) 97.5 F (36.4 C)  TempSrc: Temporal  SpO2: 97%  PainSc: 0-No pain    LUNGS: decreased breath sounds on the Left side with crackles; clear on the Right side CARDIAC: Cor RRR EXTREMITIES: no edema; limited ROM x4 SKIN: cool, dry, R forearm with skin tear that is dressed NEURO: negative except for gait problems, speech problems, and weakness       Alberta [name], LPN

## 2022-12-21 DIAGNOSIS — C9000 Multiple myeloma not having achieved remission: Secondary | ICD-10-CM | POA: Diagnosis not present

## 2022-12-21 DIAGNOSIS — I700 Atherosclerosis of aorta: Secondary | ICD-10-CM | POA: Diagnosis not present

## 2022-12-21 DIAGNOSIS — F419 Anxiety disorder, unspecified: Secondary | ICD-10-CM | POA: Diagnosis not present

## 2022-12-21 DIAGNOSIS — N400 Enlarged prostate without lower urinary tract symptoms: Secondary | ICD-10-CM | POA: Diagnosis not present

## 2022-12-21 DIAGNOSIS — E039 Hypothyroidism, unspecified: Secondary | ICD-10-CM | POA: Diagnosis not present

## 2022-12-21 DIAGNOSIS — I69341 Monoplegia of lower limb following cerebral infarction affecting right dominant side: Secondary | ICD-10-CM | POA: Diagnosis not present

## 2022-12-21 DIAGNOSIS — D630 Anemia in neoplastic disease: Secondary | ICD-10-CM | POA: Diagnosis not present

## 2022-12-21 DIAGNOSIS — I69320 Aphasia following cerebral infarction: Secondary | ICD-10-CM | POA: Diagnosis not present

## 2022-12-21 DIAGNOSIS — N1831 Chronic kidney disease, stage 3a: Secondary | ICD-10-CM | POA: Diagnosis not present

## 2022-12-21 DIAGNOSIS — I69391 Dysphagia following cerebral infarction: Secondary | ICD-10-CM | POA: Diagnosis not present

## 2022-12-21 DIAGNOSIS — H9190 Unspecified hearing loss, unspecified ear: Secondary | ICD-10-CM | POA: Diagnosis not present

## 2022-12-21 DIAGNOSIS — I69322 Dysarthria following cerebral infarction: Secondary | ICD-10-CM | POA: Diagnosis not present

## 2022-12-21 DIAGNOSIS — Z7982 Long term (current) use of aspirin: Secondary | ICD-10-CM | POA: Diagnosis not present

## 2022-12-21 DIAGNOSIS — I69398 Other sequelae of cerebral infarction: Secondary | ICD-10-CM | POA: Diagnosis not present

## 2022-12-21 DIAGNOSIS — I350 Nonrheumatic aortic (valve) stenosis: Secondary | ICD-10-CM | POA: Diagnosis not present

## 2022-12-21 DIAGNOSIS — K219 Gastro-esophageal reflux disease without esophagitis: Secondary | ICD-10-CM | POA: Diagnosis not present

## 2022-12-21 DIAGNOSIS — G8220 Paraplegia, unspecified: Secondary | ICD-10-CM | POA: Diagnosis not present

## 2022-12-21 DIAGNOSIS — M67912 Unspecified disorder of synovium and tendon, left shoulder: Secondary | ICD-10-CM | POA: Diagnosis not present

## 2022-12-21 DIAGNOSIS — C7951 Secondary malignant neoplasm of bone: Secondary | ICD-10-CM | POA: Diagnosis not present

## 2022-12-21 DIAGNOSIS — R29898 Other symptoms and signs involving the musculoskeletal system: Secondary | ICD-10-CM | POA: Diagnosis not present

## 2022-12-21 DIAGNOSIS — R1312 Dysphagia, oropharyngeal phase: Secondary | ICD-10-CM | POA: Diagnosis not present

## 2022-12-21 DIAGNOSIS — G893 Neoplasm related pain (acute) (chronic): Secondary | ICD-10-CM | POA: Diagnosis not present

## 2022-12-21 DIAGNOSIS — J479 Bronchiectasis, uncomplicated: Secondary | ICD-10-CM | POA: Diagnosis not present

## 2022-12-21 DIAGNOSIS — K5900 Constipation, unspecified: Secondary | ICD-10-CM | POA: Diagnosis not present

## 2022-12-21 DIAGNOSIS — M40204 Unspecified kyphosis, thoracic region: Secondary | ICD-10-CM | POA: Diagnosis not present

## 2022-12-23 DIAGNOSIS — J479 Bronchiectasis, uncomplicated: Secondary | ICD-10-CM | POA: Diagnosis not present

## 2022-12-23 DIAGNOSIS — G893 Neoplasm related pain (acute) (chronic): Secondary | ICD-10-CM | POA: Diagnosis not present

## 2022-12-23 DIAGNOSIS — C7951 Secondary malignant neoplasm of bone: Secondary | ICD-10-CM | POA: Diagnosis not present

## 2022-12-23 DIAGNOSIS — I69341 Monoplegia of lower limb following cerebral infarction affecting right dominant side: Secondary | ICD-10-CM | POA: Diagnosis not present

## 2022-12-23 DIAGNOSIS — C9000 Multiple myeloma not having achieved remission: Secondary | ICD-10-CM | POA: Diagnosis not present

## 2022-12-23 DIAGNOSIS — D630 Anemia in neoplastic disease: Secondary | ICD-10-CM | POA: Diagnosis not present

## 2022-12-24 DIAGNOSIS — G893 Neoplasm related pain (acute) (chronic): Secondary | ICD-10-CM | POA: Diagnosis not present

## 2022-12-24 DIAGNOSIS — D630 Anemia in neoplastic disease: Secondary | ICD-10-CM | POA: Diagnosis not present

## 2022-12-24 DIAGNOSIS — C9000 Multiple myeloma not having achieved remission: Secondary | ICD-10-CM | POA: Diagnosis not present

## 2022-12-24 DIAGNOSIS — J479 Bronchiectasis, uncomplicated: Secondary | ICD-10-CM | POA: Diagnosis not present

## 2022-12-24 DIAGNOSIS — C7951 Secondary malignant neoplasm of bone: Secondary | ICD-10-CM | POA: Diagnosis not present

## 2022-12-24 DIAGNOSIS — I69341 Monoplegia of lower limb following cerebral infarction affecting right dominant side: Secondary | ICD-10-CM | POA: Diagnosis not present

## 2022-12-25 DIAGNOSIS — C7951 Secondary malignant neoplasm of bone: Secondary | ICD-10-CM | POA: Diagnosis not present

## 2022-12-25 DIAGNOSIS — C9000 Multiple myeloma not having achieved remission: Secondary | ICD-10-CM | POA: Diagnosis not present

## 2022-12-25 DIAGNOSIS — I69341 Monoplegia of lower limb following cerebral infarction affecting right dominant side: Secondary | ICD-10-CM | POA: Diagnosis not present

## 2022-12-25 DIAGNOSIS — G893 Neoplasm related pain (acute) (chronic): Secondary | ICD-10-CM | POA: Diagnosis not present

## 2022-12-25 DIAGNOSIS — J479 Bronchiectasis, uncomplicated: Secondary | ICD-10-CM | POA: Diagnosis not present

## 2022-12-25 DIAGNOSIS — D630 Anemia in neoplastic disease: Secondary | ICD-10-CM | POA: Diagnosis not present

## 2022-12-30 DIAGNOSIS — R319 Hematuria, unspecified: Secondary | ICD-10-CM | POA: Diagnosis not present

## 2022-12-31 DIAGNOSIS — G893 Neoplasm related pain (acute) (chronic): Secondary | ICD-10-CM | POA: Diagnosis not present

## 2022-12-31 DIAGNOSIS — D630 Anemia in neoplastic disease: Secondary | ICD-10-CM | POA: Diagnosis not present

## 2022-12-31 DIAGNOSIS — J479 Bronchiectasis, uncomplicated: Secondary | ICD-10-CM | POA: Diagnosis not present

## 2022-12-31 DIAGNOSIS — I69341 Monoplegia of lower limb following cerebral infarction affecting right dominant side: Secondary | ICD-10-CM | POA: Diagnosis not present

## 2022-12-31 DIAGNOSIS — C9000 Multiple myeloma not having achieved remission: Secondary | ICD-10-CM | POA: Diagnosis not present

## 2022-12-31 DIAGNOSIS — C7951 Secondary malignant neoplasm of bone: Secondary | ICD-10-CM | POA: Diagnosis not present

## 2023-01-06 DIAGNOSIS — J479 Bronchiectasis, uncomplicated: Secondary | ICD-10-CM | POA: Diagnosis not present

## 2023-01-06 DIAGNOSIS — C9000 Multiple myeloma not having achieved remission: Secondary | ICD-10-CM | POA: Diagnosis not present

## 2023-01-06 DIAGNOSIS — D630 Anemia in neoplastic disease: Secondary | ICD-10-CM | POA: Diagnosis not present

## 2023-01-06 DIAGNOSIS — I69341 Monoplegia of lower limb following cerebral infarction affecting right dominant side: Secondary | ICD-10-CM | POA: Diagnosis not present

## 2023-01-06 DIAGNOSIS — C7951 Secondary malignant neoplasm of bone: Secondary | ICD-10-CM | POA: Diagnosis not present

## 2023-01-06 DIAGNOSIS — G893 Neoplasm related pain (acute) (chronic): Secondary | ICD-10-CM | POA: Diagnosis not present

## 2023-01-07 DIAGNOSIS — C7951 Secondary malignant neoplasm of bone: Secondary | ICD-10-CM | POA: Diagnosis not present

## 2023-01-07 DIAGNOSIS — D630 Anemia in neoplastic disease: Secondary | ICD-10-CM | POA: Diagnosis not present

## 2023-01-07 DIAGNOSIS — I69341 Monoplegia of lower limb following cerebral infarction affecting right dominant side: Secondary | ICD-10-CM | POA: Diagnosis not present

## 2023-01-07 DIAGNOSIS — G893 Neoplasm related pain (acute) (chronic): Secondary | ICD-10-CM | POA: Diagnosis not present

## 2023-01-07 DIAGNOSIS — C9000 Multiple myeloma not having achieved remission: Secondary | ICD-10-CM | POA: Diagnosis not present

## 2023-01-07 DIAGNOSIS — J479 Bronchiectasis, uncomplicated: Secondary | ICD-10-CM | POA: Diagnosis not present

## 2023-01-08 DIAGNOSIS — G893 Neoplasm related pain (acute) (chronic): Secondary | ICD-10-CM | POA: Diagnosis not present

## 2023-01-08 DIAGNOSIS — C7951 Secondary malignant neoplasm of bone: Secondary | ICD-10-CM | POA: Diagnosis not present

## 2023-01-08 DIAGNOSIS — J479 Bronchiectasis, uncomplicated: Secondary | ICD-10-CM | POA: Diagnosis not present

## 2023-01-08 DIAGNOSIS — D630 Anemia in neoplastic disease: Secondary | ICD-10-CM | POA: Diagnosis not present

## 2023-01-08 DIAGNOSIS — I69341 Monoplegia of lower limb following cerebral infarction affecting right dominant side: Secondary | ICD-10-CM | POA: Diagnosis not present

## 2023-01-08 DIAGNOSIS — C9000 Multiple myeloma not having achieved remission: Secondary | ICD-10-CM | POA: Diagnosis not present

## 2023-01-12 DIAGNOSIS — C7951 Secondary malignant neoplasm of bone: Secondary | ICD-10-CM | POA: Diagnosis not present

## 2023-01-12 DIAGNOSIS — J479 Bronchiectasis, uncomplicated: Secondary | ICD-10-CM | POA: Diagnosis not present

## 2023-01-12 DIAGNOSIS — G893 Neoplasm related pain (acute) (chronic): Secondary | ICD-10-CM | POA: Diagnosis not present

## 2023-01-12 DIAGNOSIS — I69341 Monoplegia of lower limb following cerebral infarction affecting right dominant side: Secondary | ICD-10-CM | POA: Diagnosis not present

## 2023-01-12 DIAGNOSIS — C9000 Multiple myeloma not having achieved remission: Secondary | ICD-10-CM | POA: Diagnosis not present

## 2023-01-12 DIAGNOSIS — D630 Anemia in neoplastic disease: Secondary | ICD-10-CM | POA: Diagnosis not present

## 2023-01-13 DIAGNOSIS — C9000 Multiple myeloma not having achieved remission: Secondary | ICD-10-CM | POA: Diagnosis not present

## 2023-01-13 DIAGNOSIS — J479 Bronchiectasis, uncomplicated: Secondary | ICD-10-CM | POA: Diagnosis not present

## 2023-01-13 DIAGNOSIS — I69341 Monoplegia of lower limb following cerebral infarction affecting right dominant side: Secondary | ICD-10-CM | POA: Diagnosis not present

## 2023-01-13 DIAGNOSIS — D630 Anemia in neoplastic disease: Secondary | ICD-10-CM | POA: Diagnosis not present

## 2023-01-13 DIAGNOSIS — C7951 Secondary malignant neoplasm of bone: Secondary | ICD-10-CM | POA: Diagnosis not present

## 2023-01-13 DIAGNOSIS — G893 Neoplasm related pain (acute) (chronic): Secondary | ICD-10-CM | POA: Diagnosis not present

## 2023-01-14 DIAGNOSIS — I69341 Monoplegia of lower limb following cerebral infarction affecting right dominant side: Secondary | ICD-10-CM | POA: Diagnosis not present

## 2023-01-14 DIAGNOSIS — D630 Anemia in neoplastic disease: Secondary | ICD-10-CM | POA: Diagnosis not present

## 2023-01-14 DIAGNOSIS — J479 Bronchiectasis, uncomplicated: Secondary | ICD-10-CM | POA: Diagnosis not present

## 2023-01-14 DIAGNOSIS — C9000 Multiple myeloma not having achieved remission: Secondary | ICD-10-CM | POA: Diagnosis not present

## 2023-01-14 DIAGNOSIS — C7951 Secondary malignant neoplasm of bone: Secondary | ICD-10-CM | POA: Diagnosis not present

## 2023-01-14 DIAGNOSIS — G893 Neoplasm related pain (acute) (chronic): Secondary | ICD-10-CM | POA: Diagnosis not present

## 2023-01-16 ENCOUNTER — Other Ambulatory Visit: Payer: Medicare Other

## 2023-01-16 ENCOUNTER — Telehealth: Payer: Self-pay

## 2023-01-16 DIAGNOSIS — C7951 Secondary malignant neoplasm of bone: Secondary | ICD-10-CM | POA: Diagnosis not present

## 2023-01-16 DIAGNOSIS — C9000 Multiple myeloma not having achieved remission: Secondary | ICD-10-CM | POA: Diagnosis not present

## 2023-01-16 DIAGNOSIS — J479 Bronchiectasis, uncomplicated: Secondary | ICD-10-CM | POA: Diagnosis not present

## 2023-01-16 DIAGNOSIS — I69341 Monoplegia of lower limb following cerebral infarction affecting right dominant side: Secondary | ICD-10-CM | POA: Diagnosis not present

## 2023-01-16 DIAGNOSIS — D630 Anemia in neoplastic disease: Secondary | ICD-10-CM | POA: Diagnosis not present

## 2023-01-16 DIAGNOSIS — Z515 Encounter for palliative care: Secondary | ICD-10-CM

## 2023-01-16 DIAGNOSIS — G893 Neoplasm related pain (acute) (chronic): Secondary | ICD-10-CM | POA: Diagnosis not present

## 2023-01-16 NOTE — Telephone Encounter (Signed)
(  10:07am) PC SW returned a call to [place], a Engineer, civil (consulting) with [facility]. She provided an update on patient. She advises that patient has some redness to his lower back and bruising to is leg. The patient's wife if very concern and anxious about this as this has caused problems for patient before.  SW advised Gloriajean Dell that the patient has a visit scheduled with the nurse on 02/04/23 @ 12pm with the nurse. However, she will have a nurse follow-up with the wife today via telephone to provide further assessment and support.   PJ Livengood, palliative care nurse was updated on patient status and will follow-up with wife.

## 2023-01-16 NOTE — Progress Notes (Signed)
1516 Palliative Care Follow Up Encounter Note   PATIENT NAME: Deborah Branscome DOB: [birth year] MRN: [id number]  PRIMARY CARE PROVIDER: Georgann Housekeeper, MD  RESPONSIBLE PARTY:  Acct ID - Guarantor Home Phone Work Phone Relationship Acct Type  [id number] HEWITT, ALMAND* [phone]  Self P/F     [address] [place], [place] [address]   I connected with wife on 01/16/23 by telephone and verified that I am speaking with the correct person using two identifiers.   I discussed the limitations of evaluation and management by telemedicine. The patient expressed understanding and agreed to proceed.    HISTORY OF PRESENT ILLNESS:  89 y.o. year old male with multiple myeloma with metastasis to spinal column, hx of CVA with residual right leg weakness and dysphagia/aphasia/dysarthria, gait disorder, aortic stenosis, constipation and hx of aspiration pneumonia and pleural effusion on the right side. He also has hx of prostate cancer, stage 3 CKD, hypothyroidism and anemia. He has previously had radiation in 2016  GU/GI: Wife reports that pt finished up antibiotics for a UTI last week. No symptoms of recurrent infection. No c/o pain.  Respiratory: Wife reports that breathing has been doing ok overall. States, "one of his therapists were here and noted he was short of breath while working with them."   Skin: States, that one of the caregivers reports that pt has a bump on one of his legs she is worried about, but the wife hasn't noticed it. Wife said she would ask more about that this evening. Wife also states, " he has some redness on his back where he had a sore, so I treated it with Vaseline gauze and it scabbed over, now that is gone and there is just a red area from new skin."   Next appt scheduled: Dee to see pt 02/04/23. Wife verified appt. RN ensured wife had contact information and encouraged her to call with any questions or concerns. Voiced understanding.     PHYSICAL EXAM:   VITALS:There  were no vitals filed for this visit.      Barbette Merino, RN

## 2023-01-20 DIAGNOSIS — R1312 Dysphagia, oropharyngeal phase: Secondary | ICD-10-CM | POA: Diagnosis not present

## 2023-01-20 DIAGNOSIS — I69341 Monoplegia of lower limb following cerebral infarction affecting right dominant side: Secondary | ICD-10-CM | POA: Diagnosis not present

## 2023-01-20 DIAGNOSIS — G8220 Paraplegia, unspecified: Secondary | ICD-10-CM | POA: Diagnosis not present

## 2023-01-20 DIAGNOSIS — C7951 Secondary malignant neoplasm of bone: Secondary | ICD-10-CM | POA: Diagnosis not present

## 2023-01-20 DIAGNOSIS — F419 Anxiety disorder, unspecified: Secondary | ICD-10-CM | POA: Diagnosis not present

## 2023-01-20 DIAGNOSIS — I69391 Dysphagia following cerebral infarction: Secondary | ICD-10-CM | POA: Diagnosis not present

## 2023-01-20 DIAGNOSIS — K5900 Constipation, unspecified: Secondary | ICD-10-CM | POA: Diagnosis not present

## 2023-01-20 DIAGNOSIS — M67912 Unspecified disorder of synovium and tendon, left shoulder: Secondary | ICD-10-CM | POA: Diagnosis not present

## 2023-01-20 DIAGNOSIS — E039 Hypothyroidism, unspecified: Secondary | ICD-10-CM | POA: Diagnosis not present

## 2023-01-20 DIAGNOSIS — I69322 Dysarthria following cerebral infarction: Secondary | ICD-10-CM | POA: Diagnosis not present

## 2023-01-20 DIAGNOSIS — I350 Nonrheumatic aortic (valve) stenosis: Secondary | ICD-10-CM | POA: Diagnosis not present

## 2023-01-20 DIAGNOSIS — I69398 Other sequelae of cerebral infarction: Secondary | ICD-10-CM | POA: Diagnosis not present

## 2023-01-20 DIAGNOSIS — J479 Bronchiectasis, uncomplicated: Secondary | ICD-10-CM | POA: Diagnosis not present

## 2023-01-20 DIAGNOSIS — I69320 Aphasia following cerebral infarction: Secondary | ICD-10-CM | POA: Diagnosis not present

## 2023-01-20 DIAGNOSIS — G893 Neoplasm related pain (acute) (chronic): Secondary | ICD-10-CM | POA: Diagnosis not present

## 2023-01-20 DIAGNOSIS — Z7982 Long term (current) use of aspirin: Secondary | ICD-10-CM | POA: Diagnosis not present

## 2023-01-20 DIAGNOSIS — K219 Gastro-esophageal reflux disease without esophagitis: Secondary | ICD-10-CM | POA: Diagnosis not present

## 2023-01-20 DIAGNOSIS — R29898 Other symptoms and signs involving the musculoskeletal system: Secondary | ICD-10-CM | POA: Diagnosis not present

## 2023-01-20 DIAGNOSIS — I700 Atherosclerosis of aorta: Secondary | ICD-10-CM | POA: Diagnosis not present

## 2023-01-20 DIAGNOSIS — N1831 Chronic kidney disease, stage 3a: Secondary | ICD-10-CM | POA: Diagnosis not present

## 2023-01-20 DIAGNOSIS — H9190 Unspecified hearing loss, unspecified ear: Secondary | ICD-10-CM | POA: Diagnosis not present

## 2023-01-20 DIAGNOSIS — N400 Enlarged prostate without lower urinary tract symptoms: Secondary | ICD-10-CM | POA: Diagnosis not present

## 2023-01-20 DIAGNOSIS — M40204 Unspecified kyphosis, thoracic region: Secondary | ICD-10-CM | POA: Diagnosis not present

## 2023-01-20 DIAGNOSIS — C9000 Multiple myeloma not having achieved remission: Secondary | ICD-10-CM | POA: Diagnosis not present

## 2023-01-20 DIAGNOSIS — D630 Anemia in neoplastic disease: Secondary | ICD-10-CM | POA: Diagnosis not present

## 2023-01-21 ENCOUNTER — Ambulatory Visit: Payer: TRICARE For Life (TFL)

## 2023-01-21 DIAGNOSIS — C9000 Multiple myeloma not having achieved remission: Secondary | ICD-10-CM | POA: Diagnosis not present

## 2023-01-21 DIAGNOSIS — D630 Anemia in neoplastic disease: Secondary | ICD-10-CM | POA: Diagnosis not present

## 2023-01-21 DIAGNOSIS — I69341 Monoplegia of lower limb following cerebral infarction affecting right dominant side: Secondary | ICD-10-CM | POA: Diagnosis not present

## 2023-01-21 DIAGNOSIS — G893 Neoplasm related pain (acute) (chronic): Secondary | ICD-10-CM | POA: Diagnosis not present

## 2023-01-21 DIAGNOSIS — C7951 Secondary malignant neoplasm of bone: Secondary | ICD-10-CM | POA: Diagnosis not present

## 2023-01-21 DIAGNOSIS — J479 Bronchiectasis, uncomplicated: Secondary | ICD-10-CM | POA: Diagnosis not present

## 2023-01-24 DIAGNOSIS — C7951 Secondary malignant neoplasm of bone: Secondary | ICD-10-CM | POA: Diagnosis not present

## 2023-01-24 DIAGNOSIS — D630 Anemia in neoplastic disease: Secondary | ICD-10-CM | POA: Diagnosis not present

## 2023-01-24 DIAGNOSIS — G893 Neoplasm related pain (acute) (chronic): Secondary | ICD-10-CM | POA: Diagnosis not present

## 2023-01-24 DIAGNOSIS — I69341 Monoplegia of lower limb following cerebral infarction affecting right dominant side: Secondary | ICD-10-CM | POA: Diagnosis not present

## 2023-01-24 DIAGNOSIS — J479 Bronchiectasis, uncomplicated: Secondary | ICD-10-CM | POA: Diagnosis not present

## 2023-01-24 DIAGNOSIS — C9000 Multiple myeloma not having achieved remission: Secondary | ICD-10-CM | POA: Diagnosis not present

## 2023-01-26 DIAGNOSIS — J479 Bronchiectasis, uncomplicated: Secondary | ICD-10-CM | POA: Diagnosis not present

## 2023-01-26 DIAGNOSIS — G893 Neoplasm related pain (acute) (chronic): Secondary | ICD-10-CM | POA: Diagnosis not present

## 2023-01-26 DIAGNOSIS — C9000 Multiple myeloma not having achieved remission: Secondary | ICD-10-CM | POA: Diagnosis not present

## 2023-01-26 DIAGNOSIS — D630 Anemia in neoplastic disease: Secondary | ICD-10-CM | POA: Diagnosis not present

## 2023-01-26 DIAGNOSIS — C7951 Secondary malignant neoplasm of bone: Secondary | ICD-10-CM | POA: Diagnosis not present

## 2023-01-26 DIAGNOSIS — I69341 Monoplegia of lower limb following cerebral infarction affecting right dominant side: Secondary | ICD-10-CM | POA: Diagnosis not present

## 2023-01-28 DIAGNOSIS — C7951 Secondary malignant neoplasm of bone: Secondary | ICD-10-CM | POA: Diagnosis not present

## 2023-01-28 DIAGNOSIS — D630 Anemia in neoplastic disease: Secondary | ICD-10-CM | POA: Diagnosis not present

## 2023-01-28 DIAGNOSIS — J479 Bronchiectasis, uncomplicated: Secondary | ICD-10-CM | POA: Diagnosis not present

## 2023-01-28 DIAGNOSIS — G893 Neoplasm related pain (acute) (chronic): Secondary | ICD-10-CM | POA: Diagnosis not present

## 2023-01-28 DIAGNOSIS — I69341 Monoplegia of lower limb following cerebral infarction affecting right dominant side: Secondary | ICD-10-CM | POA: Diagnosis not present

## 2023-01-28 DIAGNOSIS — C9000 Multiple myeloma not having achieved remission: Secondary | ICD-10-CM | POA: Diagnosis not present

## 2023-01-29 DIAGNOSIS — G893 Neoplasm related pain (acute) (chronic): Secondary | ICD-10-CM | POA: Diagnosis not present

## 2023-01-29 DIAGNOSIS — D630 Anemia in neoplastic disease: Secondary | ICD-10-CM | POA: Diagnosis not present

## 2023-01-29 DIAGNOSIS — J479 Bronchiectasis, uncomplicated: Secondary | ICD-10-CM | POA: Diagnosis not present

## 2023-01-29 DIAGNOSIS — C9000 Multiple myeloma not having achieved remission: Secondary | ICD-10-CM | POA: Diagnosis not present

## 2023-01-29 DIAGNOSIS — C7951 Secondary malignant neoplasm of bone: Secondary | ICD-10-CM | POA: Diagnosis not present

## 2023-01-29 DIAGNOSIS — I69341 Monoplegia of lower limb following cerebral infarction affecting right dominant side: Secondary | ICD-10-CM | POA: Diagnosis not present

## 2023-01-30 ENCOUNTER — Ambulatory Visit
Admission: RE | Admit: 2023-01-30 | Discharge: 2023-01-30 | Disposition: A | Discharge status: Home / Self Care | Payer: Medicare Other | Source: Ambulatory Visit | Attending: Internal Medicine | Admitting: Internal Medicine

## 2023-01-30 ENCOUNTER — Other Ambulatory Visit: Payer: Self-pay | Admitting: Internal Medicine

## 2023-01-30 DIAGNOSIS — J189 Pneumonia, unspecified organism: Secondary | ICD-10-CM

## 2023-01-30 DIAGNOSIS — R918 Other nonspecific abnormal finding of lung field: Secondary | ICD-10-CM | POA: Diagnosis not present

## 2023-01-31 DIAGNOSIS — L080 Pyoderma: Secondary | ICD-10-CM | POA: Diagnosis not present

## 2023-02-02 DIAGNOSIS — C9000 Multiple myeloma not having achieved remission: Secondary | ICD-10-CM | POA: Diagnosis not present

## 2023-02-02 DIAGNOSIS — D630 Anemia in neoplastic disease: Secondary | ICD-10-CM | POA: Diagnosis not present

## 2023-02-02 DIAGNOSIS — J479 Bronchiectasis, uncomplicated: Secondary | ICD-10-CM | POA: Diagnosis not present

## 2023-02-02 DIAGNOSIS — G893 Neoplasm related pain (acute) (chronic): Secondary | ICD-10-CM | POA: Diagnosis not present

## 2023-02-02 DIAGNOSIS — C7951 Secondary malignant neoplasm of bone: Secondary | ICD-10-CM | POA: Diagnosis not present

## 2023-02-02 DIAGNOSIS — I69341 Monoplegia of lower limb following cerebral infarction affecting right dominant side: Secondary | ICD-10-CM | POA: Diagnosis not present

## 2023-02-04 ENCOUNTER — Other Ambulatory Visit: Payer: Medicare Other

## 2023-02-04 DIAGNOSIS — D630 Anemia in neoplastic disease: Secondary | ICD-10-CM | POA: Diagnosis not present

## 2023-02-04 DIAGNOSIS — J479 Bronchiectasis, uncomplicated: Secondary | ICD-10-CM | POA: Diagnosis not present

## 2023-02-04 DIAGNOSIS — C9000 Multiple myeloma not having achieved remission: Secondary | ICD-10-CM | POA: Diagnosis not present

## 2023-02-04 DIAGNOSIS — I69341 Monoplegia of lower limb following cerebral infarction affecting right dominant side: Secondary | ICD-10-CM | POA: Diagnosis not present

## 2023-02-04 DIAGNOSIS — C7951 Secondary malignant neoplasm of bone: Secondary | ICD-10-CM | POA: Diagnosis not present

## 2023-02-04 DIAGNOSIS — G893 Neoplasm related pain (acute) (chronic): Secondary | ICD-10-CM | POA: Diagnosis not present

## 2023-02-05 DIAGNOSIS — C7951 Secondary malignant neoplasm of bone: Secondary | ICD-10-CM | POA: Diagnosis not present

## 2023-02-05 DIAGNOSIS — C9000 Multiple myeloma not having achieved remission: Secondary | ICD-10-CM | POA: Diagnosis not present

## 2023-02-05 DIAGNOSIS — J479 Bronchiectasis, uncomplicated: Secondary | ICD-10-CM | POA: Diagnosis not present

## 2023-02-05 DIAGNOSIS — D630 Anemia in neoplastic disease: Secondary | ICD-10-CM | POA: Diagnosis not present

## 2023-02-05 DIAGNOSIS — G893 Neoplasm related pain (acute) (chronic): Secondary | ICD-10-CM | POA: Diagnosis not present

## 2023-02-05 DIAGNOSIS — I69341 Monoplegia of lower limb following cerebral infarction affecting right dominant side: Secondary | ICD-10-CM | POA: Diagnosis not present

## 2023-02-06 DIAGNOSIS — L089 Local infection of the skin and subcutaneous tissue, unspecified: Secondary | ICD-10-CM | POA: Diagnosis not present

## 2023-02-09 DIAGNOSIS — C7951 Secondary malignant neoplasm of bone: Secondary | ICD-10-CM | POA: Diagnosis not present

## 2023-02-09 DIAGNOSIS — I69341 Monoplegia of lower limb following cerebral infarction affecting right dominant side: Secondary | ICD-10-CM | POA: Diagnosis not present

## 2023-02-09 DIAGNOSIS — C9000 Multiple myeloma not having achieved remission: Secondary | ICD-10-CM | POA: Diagnosis not present

## 2023-02-09 DIAGNOSIS — J479 Bronchiectasis, uncomplicated: Secondary | ICD-10-CM | POA: Diagnosis not present

## 2023-02-09 DIAGNOSIS — D630 Anemia in neoplastic disease: Secondary | ICD-10-CM | POA: Diagnosis not present

## 2023-02-09 DIAGNOSIS — G893 Neoplasm related pain (acute) (chronic): Secondary | ICD-10-CM | POA: Diagnosis not present

## 2023-02-11 DIAGNOSIS — J479 Bronchiectasis, uncomplicated: Secondary | ICD-10-CM | POA: Diagnosis not present

## 2023-02-11 DIAGNOSIS — G893 Neoplasm related pain (acute) (chronic): Secondary | ICD-10-CM | POA: Diagnosis not present

## 2023-02-11 DIAGNOSIS — C9000 Multiple myeloma not having achieved remission: Secondary | ICD-10-CM | POA: Diagnosis not present

## 2023-02-11 DIAGNOSIS — D630 Anemia in neoplastic disease: Secondary | ICD-10-CM | POA: Diagnosis not present

## 2023-02-11 DIAGNOSIS — C7951 Secondary malignant neoplasm of bone: Secondary | ICD-10-CM | POA: Diagnosis not present

## 2023-02-11 DIAGNOSIS — I69341 Monoplegia of lower limb following cerebral infarction affecting right dominant side: Secondary | ICD-10-CM | POA: Diagnosis not present

## 2023-02-12 DIAGNOSIS — G893 Neoplasm related pain (acute) (chronic): Secondary | ICD-10-CM | POA: Diagnosis not present

## 2023-02-12 DIAGNOSIS — I69341 Monoplegia of lower limb following cerebral infarction affecting right dominant side: Secondary | ICD-10-CM | POA: Diagnosis not present

## 2023-02-12 DIAGNOSIS — C9000 Multiple myeloma not having achieved remission: Secondary | ICD-10-CM | POA: Diagnosis not present

## 2023-02-12 DIAGNOSIS — C7951 Secondary malignant neoplasm of bone: Secondary | ICD-10-CM | POA: Diagnosis not present

## 2023-02-12 DIAGNOSIS — J479 Bronchiectasis, uncomplicated: Secondary | ICD-10-CM | POA: Diagnosis not present

## 2023-02-12 DIAGNOSIS — D630 Anemia in neoplastic disease: Secondary | ICD-10-CM | POA: Diagnosis not present

## 2023-02-13 DIAGNOSIS — D630 Anemia in neoplastic disease: Secondary | ICD-10-CM | POA: Diagnosis not present

## 2023-02-13 DIAGNOSIS — J479 Bronchiectasis, uncomplicated: Secondary | ICD-10-CM | POA: Diagnosis not present

## 2023-02-13 DIAGNOSIS — I69341 Monoplegia of lower limb following cerebral infarction affecting right dominant side: Secondary | ICD-10-CM | POA: Diagnosis not present

## 2023-02-13 DIAGNOSIS — C9000 Multiple myeloma not having achieved remission: Secondary | ICD-10-CM | POA: Diagnosis not present

## 2023-02-13 DIAGNOSIS — G893 Neoplasm related pain (acute) (chronic): Secondary | ICD-10-CM | POA: Diagnosis not present

## 2023-02-13 DIAGNOSIS — C7951 Secondary malignant neoplasm of bone: Secondary | ICD-10-CM | POA: Diagnosis not present

## 2023-02-16 DIAGNOSIS — C7951 Secondary malignant neoplasm of bone: Secondary | ICD-10-CM | POA: Diagnosis not present

## 2023-02-16 DIAGNOSIS — R1310 Dysphagia, unspecified: Secondary | ICD-10-CM | POA: Diagnosis not present

## 2023-02-16 DIAGNOSIS — Z8546 Personal history of malignant neoplasm of prostate: Secondary | ICD-10-CM | POA: Diagnosis not present

## 2023-02-16 DIAGNOSIS — I69351 Hemiplegia and hemiparesis following cerebral infarction affecting right dominant side: Secondary | ICD-10-CM | POA: Diagnosis not present

## 2023-02-16 DIAGNOSIS — Z0000 Encounter for general adult medical examination without abnormal findings: Secondary | ICD-10-CM | POA: Diagnosis not present

## 2023-02-16 DIAGNOSIS — D630 Anemia in neoplastic disease: Secondary | ICD-10-CM | POA: Diagnosis not present

## 2023-02-16 DIAGNOSIS — M1990 Unspecified osteoarthritis, unspecified site: Secondary | ICD-10-CM | POA: Diagnosis not present

## 2023-02-16 DIAGNOSIS — R7309 Other abnormal glucose: Secondary | ICD-10-CM | POA: Diagnosis not present

## 2023-02-16 DIAGNOSIS — C9000 Multiple myeloma not having achieved remission: Secondary | ICD-10-CM | POA: Diagnosis not present

## 2023-02-16 DIAGNOSIS — G893 Neoplasm related pain (acute) (chronic): Secondary | ICD-10-CM | POA: Diagnosis not present

## 2023-02-16 DIAGNOSIS — E039 Hypothyroidism, unspecified: Secondary | ICD-10-CM | POA: Diagnosis not present

## 2023-02-16 DIAGNOSIS — D696 Thrombocytopenia, unspecified: Secondary | ICD-10-CM | POA: Diagnosis not present

## 2023-02-16 DIAGNOSIS — I69341 Monoplegia of lower limb following cerebral infarction affecting right dominant side: Secondary | ICD-10-CM | POA: Diagnosis not present

## 2023-02-16 DIAGNOSIS — I6340 Cerebral infarction due to embolism of unspecified cerebral artery: Secondary | ICD-10-CM | POA: Diagnosis not present

## 2023-02-16 DIAGNOSIS — E46 Unspecified protein-calorie malnutrition: Secondary | ICD-10-CM | POA: Diagnosis not present

## 2023-02-16 DIAGNOSIS — J479 Bronchiectasis, uncomplicated: Secondary | ICD-10-CM | POA: Diagnosis not present

## 2023-02-19 DIAGNOSIS — C7951 Secondary malignant neoplasm of bone: Secondary | ICD-10-CM | POA: Diagnosis not present

## 2023-02-19 DIAGNOSIS — G8220 Paraplegia, unspecified: Secondary | ICD-10-CM | POA: Diagnosis not present

## 2023-02-19 DIAGNOSIS — R29898 Other symptoms and signs involving the musculoskeletal system: Secondary | ICD-10-CM | POA: Diagnosis not present

## 2023-02-19 DIAGNOSIS — Z7982 Long term (current) use of aspirin: Secondary | ICD-10-CM | POA: Diagnosis not present

## 2023-02-19 DIAGNOSIS — I69391 Dysphagia following cerebral infarction: Secondary | ICD-10-CM | POA: Diagnosis not present

## 2023-02-19 DIAGNOSIS — Z87440 Personal history of urinary (tract) infections: Secondary | ICD-10-CM | POA: Diagnosis not present

## 2023-02-19 DIAGNOSIS — I69322 Dysarthria following cerebral infarction: Secondary | ICD-10-CM | POA: Diagnosis not present

## 2023-02-19 DIAGNOSIS — N1831 Chronic kidney disease, stage 3a: Secondary | ICD-10-CM | POA: Diagnosis not present

## 2023-02-19 DIAGNOSIS — K219 Gastro-esophageal reflux disease without esophagitis: Secondary | ICD-10-CM | POA: Diagnosis not present

## 2023-02-19 DIAGNOSIS — I69320 Aphasia following cerebral infarction: Secondary | ICD-10-CM | POA: Diagnosis not present

## 2023-02-19 DIAGNOSIS — E039 Hypothyroidism, unspecified: Secondary | ICD-10-CM | POA: Diagnosis not present

## 2023-02-19 DIAGNOSIS — I350 Nonrheumatic aortic (valve) stenosis: Secondary | ICD-10-CM | POA: Diagnosis not present

## 2023-02-19 DIAGNOSIS — F419 Anxiety disorder, unspecified: Secondary | ICD-10-CM | POA: Diagnosis not present

## 2023-02-19 DIAGNOSIS — R1312 Dysphagia, oropharyngeal phase: Secondary | ICD-10-CM | POA: Diagnosis not present

## 2023-02-19 DIAGNOSIS — N400 Enlarged prostate without lower urinary tract symptoms: Secondary | ICD-10-CM | POA: Diagnosis not present

## 2023-02-19 DIAGNOSIS — D631 Anemia in chronic kidney disease: Secondary | ICD-10-CM | POA: Diagnosis not present

## 2023-02-19 DIAGNOSIS — M67912 Unspecified disorder of synovium and tendon, left shoulder: Secondary | ICD-10-CM | POA: Diagnosis not present

## 2023-02-19 DIAGNOSIS — G893 Neoplasm related pain (acute) (chronic): Secondary | ICD-10-CM | POA: Diagnosis not present

## 2023-02-19 DIAGNOSIS — H9190 Unspecified hearing loss, unspecified ear: Secondary | ICD-10-CM | POA: Diagnosis not present

## 2023-02-19 DIAGNOSIS — M40204 Unspecified kyphosis, thoracic region: Secondary | ICD-10-CM | POA: Diagnosis not present

## 2023-02-19 DIAGNOSIS — I69398 Other sequelae of cerebral infarction: Secondary | ICD-10-CM | POA: Diagnosis not present

## 2023-02-19 DIAGNOSIS — D630 Anemia in neoplastic disease: Secondary | ICD-10-CM | POA: Diagnosis not present

## 2023-02-19 DIAGNOSIS — C9000 Multiple myeloma not having achieved remission: Secondary | ICD-10-CM | POA: Diagnosis not present

## 2023-02-19 DIAGNOSIS — J479 Bronchiectasis, uncomplicated: Secondary | ICD-10-CM | POA: Diagnosis not present

## 2023-02-19 DIAGNOSIS — I700 Atherosclerosis of aorta: Secondary | ICD-10-CM | POA: Diagnosis not present

## 2023-02-23 DIAGNOSIS — G893 Neoplasm related pain (acute) (chronic): Secondary | ICD-10-CM | POA: Diagnosis not present

## 2023-02-23 DIAGNOSIS — C9000 Multiple myeloma not having achieved remission: Secondary | ICD-10-CM | POA: Diagnosis not present

## 2023-02-23 DIAGNOSIS — D630 Anemia in neoplastic disease: Secondary | ICD-10-CM | POA: Diagnosis not present

## 2023-02-23 DIAGNOSIS — C7951 Secondary malignant neoplasm of bone: Secondary | ICD-10-CM | POA: Diagnosis not present

## 2023-02-23 DIAGNOSIS — N1831 Chronic kidney disease, stage 3a: Secondary | ICD-10-CM | POA: Diagnosis not present

## 2023-02-23 DIAGNOSIS — D631 Anemia in chronic kidney disease: Secondary | ICD-10-CM | POA: Diagnosis not present

## 2023-02-26 DIAGNOSIS — G893 Neoplasm related pain (acute) (chronic): Secondary | ICD-10-CM | POA: Diagnosis not present

## 2023-02-26 DIAGNOSIS — D630 Anemia in neoplastic disease: Secondary | ICD-10-CM | POA: Diagnosis not present

## 2023-02-26 DIAGNOSIS — C9000 Multiple myeloma not having achieved remission: Secondary | ICD-10-CM | POA: Diagnosis not present

## 2023-02-26 DIAGNOSIS — D631 Anemia in chronic kidney disease: Secondary | ICD-10-CM | POA: Diagnosis not present

## 2023-02-26 DIAGNOSIS — N1831 Chronic kidney disease, stage 3a: Secondary | ICD-10-CM | POA: Diagnosis not present

## 2023-02-26 DIAGNOSIS — C7951 Secondary malignant neoplasm of bone: Secondary | ICD-10-CM | POA: Diagnosis not present

## 2023-02-27 DIAGNOSIS — N1831 Chronic kidney disease, stage 3a: Secondary | ICD-10-CM | POA: Diagnosis not present

## 2023-02-27 DIAGNOSIS — C7951 Secondary malignant neoplasm of bone: Secondary | ICD-10-CM | POA: Diagnosis not present

## 2023-02-27 DIAGNOSIS — C9000 Multiple myeloma not having achieved remission: Secondary | ICD-10-CM | POA: Diagnosis not present

## 2023-02-27 DIAGNOSIS — D631 Anemia in chronic kidney disease: Secondary | ICD-10-CM | POA: Diagnosis not present

## 2023-02-27 DIAGNOSIS — D630 Anemia in neoplastic disease: Secondary | ICD-10-CM | POA: Diagnosis not present

## 2023-02-27 DIAGNOSIS — G893 Neoplasm related pain (acute) (chronic): Secondary | ICD-10-CM | POA: Diagnosis not present

## 2023-03-02 DIAGNOSIS — C9000 Multiple myeloma not having achieved remission: Secondary | ICD-10-CM | POA: Diagnosis not present

## 2023-03-02 DIAGNOSIS — N1831 Chronic kidney disease, stage 3a: Secondary | ICD-10-CM | POA: Diagnosis not present

## 2023-03-02 DIAGNOSIS — D630 Anemia in neoplastic disease: Secondary | ICD-10-CM | POA: Diagnosis not present

## 2023-03-02 DIAGNOSIS — G893 Neoplasm related pain (acute) (chronic): Secondary | ICD-10-CM | POA: Diagnosis not present

## 2023-03-02 DIAGNOSIS — D631 Anemia in chronic kidney disease: Secondary | ICD-10-CM | POA: Diagnosis not present

## 2023-03-02 DIAGNOSIS — C7951 Secondary malignant neoplasm of bone: Secondary | ICD-10-CM | POA: Diagnosis not present

## 2023-03-04 DIAGNOSIS — D631 Anemia in chronic kidney disease: Secondary | ICD-10-CM | POA: Diagnosis not present

## 2023-03-04 DIAGNOSIS — D630 Anemia in neoplastic disease: Secondary | ICD-10-CM | POA: Diagnosis not present

## 2023-03-04 DIAGNOSIS — C9000 Multiple myeloma not having achieved remission: Secondary | ICD-10-CM | POA: Diagnosis not present

## 2023-03-04 DIAGNOSIS — C7951 Secondary malignant neoplasm of bone: Secondary | ICD-10-CM | POA: Diagnosis not present

## 2023-03-04 DIAGNOSIS — N1831 Chronic kidney disease, stage 3a: Secondary | ICD-10-CM | POA: Diagnosis not present

## 2023-03-04 DIAGNOSIS — G893 Neoplasm related pain (acute) (chronic): Secondary | ICD-10-CM | POA: Diagnosis not present

## 2023-03-06 DIAGNOSIS — D630 Anemia in neoplastic disease: Secondary | ICD-10-CM | POA: Diagnosis not present

## 2023-03-06 DIAGNOSIS — C9000 Multiple myeloma not having achieved remission: Secondary | ICD-10-CM | POA: Diagnosis not present

## 2023-03-06 DIAGNOSIS — C7951 Secondary malignant neoplasm of bone: Secondary | ICD-10-CM | POA: Diagnosis not present

## 2023-03-06 DIAGNOSIS — D631 Anemia in chronic kidney disease: Secondary | ICD-10-CM | POA: Diagnosis not present

## 2023-03-06 DIAGNOSIS — G893 Neoplasm related pain (acute) (chronic): Secondary | ICD-10-CM | POA: Diagnosis not present

## 2023-03-06 DIAGNOSIS — N1831 Chronic kidney disease, stage 3a: Secondary | ICD-10-CM | POA: Diagnosis not present

## 2023-03-09 DIAGNOSIS — C9000 Multiple myeloma not having achieved remission: Secondary | ICD-10-CM | POA: Diagnosis not present

## 2023-03-09 DIAGNOSIS — D631 Anemia in chronic kidney disease: Secondary | ICD-10-CM | POA: Diagnosis not present

## 2023-03-09 DIAGNOSIS — G893 Neoplasm related pain (acute) (chronic): Secondary | ICD-10-CM | POA: Diagnosis not present

## 2023-03-09 DIAGNOSIS — D630 Anemia in neoplastic disease: Secondary | ICD-10-CM | POA: Diagnosis not present

## 2023-03-09 DIAGNOSIS — N1831 Chronic kidney disease, stage 3a: Secondary | ICD-10-CM | POA: Diagnosis not present

## 2023-03-09 DIAGNOSIS — C7951 Secondary malignant neoplasm of bone: Secondary | ICD-10-CM | POA: Diagnosis not present

## 2023-03-10 DIAGNOSIS — D631 Anemia in chronic kidney disease: Secondary | ICD-10-CM | POA: Diagnosis not present

## 2023-03-10 DIAGNOSIS — C9000 Multiple myeloma not having achieved remission: Secondary | ICD-10-CM | POA: Diagnosis not present

## 2023-03-10 DIAGNOSIS — C7951 Secondary malignant neoplasm of bone: Secondary | ICD-10-CM | POA: Diagnosis not present

## 2023-03-10 DIAGNOSIS — N1831 Chronic kidney disease, stage 3a: Secondary | ICD-10-CM | POA: Diagnosis not present

## 2023-03-10 DIAGNOSIS — G893 Neoplasm related pain (acute) (chronic): Secondary | ICD-10-CM | POA: Diagnosis not present

## 2023-03-10 DIAGNOSIS — D630 Anemia in neoplastic disease: Secondary | ICD-10-CM | POA: Diagnosis not present

## 2023-03-12 DIAGNOSIS — G893 Neoplasm related pain (acute) (chronic): Secondary | ICD-10-CM | POA: Diagnosis not present

## 2023-03-12 DIAGNOSIS — N1831 Chronic kidney disease, stage 3a: Secondary | ICD-10-CM | POA: Diagnosis not present

## 2023-03-12 DIAGNOSIS — D631 Anemia in chronic kidney disease: Secondary | ICD-10-CM | POA: Diagnosis not present

## 2023-03-12 DIAGNOSIS — C9000 Multiple myeloma not having achieved remission: Secondary | ICD-10-CM | POA: Diagnosis not present

## 2023-03-12 DIAGNOSIS — D630 Anemia in neoplastic disease: Secondary | ICD-10-CM | POA: Diagnosis not present

## 2023-03-12 DIAGNOSIS — C7951 Secondary malignant neoplasm of bone: Secondary | ICD-10-CM | POA: Diagnosis not present

## 2023-03-17 DIAGNOSIS — C7951 Secondary malignant neoplasm of bone: Secondary | ICD-10-CM | POA: Diagnosis not present

## 2023-03-17 DIAGNOSIS — D630 Anemia in neoplastic disease: Secondary | ICD-10-CM | POA: Diagnosis not present

## 2023-03-17 DIAGNOSIS — N1831 Chronic kidney disease, stage 3a: Secondary | ICD-10-CM | POA: Diagnosis not present

## 2023-03-17 DIAGNOSIS — D631 Anemia in chronic kidney disease: Secondary | ICD-10-CM | POA: Diagnosis not present

## 2023-03-17 DIAGNOSIS — C9000 Multiple myeloma not having achieved remission: Secondary | ICD-10-CM | POA: Diagnosis not present

## 2023-03-17 DIAGNOSIS — G893 Neoplasm related pain (acute) (chronic): Secondary | ICD-10-CM | POA: Diagnosis not present

## 2023-03-18 DIAGNOSIS — G893 Neoplasm related pain (acute) (chronic): Secondary | ICD-10-CM | POA: Diagnosis not present

## 2023-03-18 DIAGNOSIS — D630 Anemia in neoplastic disease: Secondary | ICD-10-CM | POA: Diagnosis not present

## 2023-03-18 DIAGNOSIS — D631 Anemia in chronic kidney disease: Secondary | ICD-10-CM | POA: Diagnosis not present

## 2023-03-18 DIAGNOSIS — C9000 Multiple myeloma not having achieved remission: Secondary | ICD-10-CM | POA: Diagnosis not present

## 2023-03-18 DIAGNOSIS — C7951 Secondary malignant neoplasm of bone: Secondary | ICD-10-CM | POA: Diagnosis not present

## 2023-03-18 DIAGNOSIS — N1831 Chronic kidney disease, stage 3a: Secondary | ICD-10-CM | POA: Diagnosis not present

## 2023-03-19 DIAGNOSIS — C9000 Multiple myeloma not having achieved remission: Secondary | ICD-10-CM | POA: Diagnosis not present

## 2023-03-19 DIAGNOSIS — D631 Anemia in chronic kidney disease: Secondary | ICD-10-CM | POA: Diagnosis not present

## 2023-03-19 DIAGNOSIS — G893 Neoplasm related pain (acute) (chronic): Secondary | ICD-10-CM | POA: Diagnosis not present

## 2023-03-19 DIAGNOSIS — C7951 Secondary malignant neoplasm of bone: Secondary | ICD-10-CM | POA: Diagnosis not present

## 2023-03-19 DIAGNOSIS — N1831 Chronic kidney disease, stage 3a: Secondary | ICD-10-CM | POA: Diagnosis not present

## 2023-03-19 DIAGNOSIS — D630 Anemia in neoplastic disease: Secondary | ICD-10-CM | POA: Diagnosis not present

## 2023-03-21 DIAGNOSIS — F419 Anxiety disorder, unspecified: Secondary | ICD-10-CM | POA: Diagnosis not present

## 2023-03-21 DIAGNOSIS — Z7982 Long term (current) use of aspirin: Secondary | ICD-10-CM | POA: Diagnosis not present

## 2023-03-21 DIAGNOSIS — M40204 Unspecified kyphosis, thoracic region: Secondary | ICD-10-CM | POA: Diagnosis not present

## 2023-03-21 DIAGNOSIS — I69391 Dysphagia following cerebral infarction: Secondary | ICD-10-CM | POA: Diagnosis not present

## 2023-03-21 DIAGNOSIS — E039 Hypothyroidism, unspecified: Secondary | ICD-10-CM | POA: Diagnosis not present

## 2023-03-21 DIAGNOSIS — N1831 Chronic kidney disease, stage 3a: Secondary | ICD-10-CM | POA: Diagnosis not present

## 2023-03-21 DIAGNOSIS — G893 Neoplasm related pain (acute) (chronic): Secondary | ICD-10-CM | POA: Diagnosis not present

## 2023-03-21 DIAGNOSIS — Z87440 Personal history of urinary (tract) infections: Secondary | ICD-10-CM | POA: Diagnosis not present

## 2023-03-21 DIAGNOSIS — C7951 Secondary malignant neoplasm of bone: Secondary | ICD-10-CM | POA: Diagnosis not present

## 2023-03-21 DIAGNOSIS — J479 Bronchiectasis, uncomplicated: Secondary | ICD-10-CM | POA: Diagnosis not present

## 2023-03-21 DIAGNOSIS — M67912 Unspecified disorder of synovium and tendon, left shoulder: Secondary | ICD-10-CM | POA: Diagnosis not present

## 2023-03-21 DIAGNOSIS — I350 Nonrheumatic aortic (valve) stenosis: Secondary | ICD-10-CM | POA: Diagnosis not present

## 2023-03-21 DIAGNOSIS — I69320 Aphasia following cerebral infarction: Secondary | ICD-10-CM | POA: Diagnosis not present

## 2023-03-21 DIAGNOSIS — I69322 Dysarthria following cerebral infarction: Secondary | ICD-10-CM | POA: Diagnosis not present

## 2023-03-21 DIAGNOSIS — G8220 Paraplegia, unspecified: Secondary | ICD-10-CM | POA: Diagnosis not present

## 2023-03-21 DIAGNOSIS — D631 Anemia in chronic kidney disease: Secondary | ICD-10-CM | POA: Diagnosis not present

## 2023-03-21 DIAGNOSIS — I700 Atherosclerosis of aorta: Secondary | ICD-10-CM | POA: Diagnosis not present

## 2023-03-21 DIAGNOSIS — R1312 Dysphagia, oropharyngeal phase: Secondary | ICD-10-CM | POA: Diagnosis not present

## 2023-03-21 DIAGNOSIS — R29898 Other symptoms and signs involving the musculoskeletal system: Secondary | ICD-10-CM | POA: Diagnosis not present

## 2023-03-21 DIAGNOSIS — C9000 Multiple myeloma not having achieved remission: Secondary | ICD-10-CM | POA: Diagnosis not present

## 2023-03-21 DIAGNOSIS — I69398 Other sequelae of cerebral infarction: Secondary | ICD-10-CM | POA: Diagnosis not present

## 2023-03-21 DIAGNOSIS — K219 Gastro-esophageal reflux disease without esophagitis: Secondary | ICD-10-CM | POA: Diagnosis not present

## 2023-03-21 DIAGNOSIS — N400 Enlarged prostate without lower urinary tract symptoms: Secondary | ICD-10-CM | POA: Diagnosis not present

## 2023-03-21 DIAGNOSIS — D630 Anemia in neoplastic disease: Secondary | ICD-10-CM | POA: Diagnosis not present

## 2023-03-21 DIAGNOSIS — H9190 Unspecified hearing loss, unspecified ear: Secondary | ICD-10-CM | POA: Diagnosis not present

## 2023-03-24 DIAGNOSIS — N1831 Chronic kidney disease, stage 3a: Secondary | ICD-10-CM | POA: Diagnosis not present

## 2023-03-24 DIAGNOSIS — C7951 Secondary malignant neoplasm of bone: Secondary | ICD-10-CM | POA: Diagnosis not present

## 2023-03-24 DIAGNOSIS — G893 Neoplasm related pain (acute) (chronic): Secondary | ICD-10-CM | POA: Diagnosis not present

## 2023-03-24 DIAGNOSIS — C9000 Multiple myeloma not having achieved remission: Secondary | ICD-10-CM | POA: Diagnosis not present

## 2023-03-24 DIAGNOSIS — D631 Anemia in chronic kidney disease: Secondary | ICD-10-CM | POA: Diagnosis not present

## 2023-03-24 DIAGNOSIS — D630 Anemia in neoplastic disease: Secondary | ICD-10-CM | POA: Diagnosis not present

## 2023-03-26 DIAGNOSIS — G893 Neoplasm related pain (acute) (chronic): Secondary | ICD-10-CM | POA: Diagnosis not present

## 2023-03-26 DIAGNOSIS — C9000 Multiple myeloma not having achieved remission: Secondary | ICD-10-CM | POA: Diagnosis not present

## 2023-03-26 DIAGNOSIS — D631 Anemia in chronic kidney disease: Secondary | ICD-10-CM | POA: Diagnosis not present

## 2023-03-26 DIAGNOSIS — D630 Anemia in neoplastic disease: Secondary | ICD-10-CM | POA: Diagnosis not present

## 2023-03-26 DIAGNOSIS — N1831 Chronic kidney disease, stage 3a: Secondary | ICD-10-CM | POA: Diagnosis not present

## 2023-03-26 DIAGNOSIS — C7951 Secondary malignant neoplasm of bone: Secondary | ICD-10-CM | POA: Diagnosis not present

## 2023-03-27 DIAGNOSIS — C7951 Secondary malignant neoplasm of bone: Secondary | ICD-10-CM | POA: Diagnosis not present

## 2023-03-27 DIAGNOSIS — N1831 Chronic kidney disease, stage 3a: Secondary | ICD-10-CM | POA: Diagnosis not present

## 2023-03-27 DIAGNOSIS — C9000 Multiple myeloma not having achieved remission: Secondary | ICD-10-CM | POA: Diagnosis not present

## 2023-03-27 DIAGNOSIS — G893 Neoplasm related pain (acute) (chronic): Secondary | ICD-10-CM | POA: Diagnosis not present

## 2023-03-27 DIAGNOSIS — D630 Anemia in neoplastic disease: Secondary | ICD-10-CM | POA: Diagnosis not present

## 2023-03-27 DIAGNOSIS — D631 Anemia in chronic kidney disease: Secondary | ICD-10-CM | POA: Diagnosis not present

## 2023-03-30 DIAGNOSIS — G893 Neoplasm related pain (acute) (chronic): Secondary | ICD-10-CM | POA: Diagnosis not present

## 2023-03-30 DIAGNOSIS — C7951 Secondary malignant neoplasm of bone: Secondary | ICD-10-CM | POA: Diagnosis not present

## 2023-03-30 DIAGNOSIS — C9000 Multiple myeloma not having achieved remission: Secondary | ICD-10-CM | POA: Diagnosis not present

## 2023-03-30 DIAGNOSIS — D630 Anemia in neoplastic disease: Secondary | ICD-10-CM | POA: Diagnosis not present

## 2023-03-30 DIAGNOSIS — N1831 Chronic kidney disease, stage 3a: Secondary | ICD-10-CM | POA: Diagnosis not present

## 2023-03-30 DIAGNOSIS — D631 Anemia in chronic kidney disease: Secondary | ICD-10-CM | POA: Diagnosis not present

## 2023-03-31 DIAGNOSIS — N390 Urinary tract infection, site not specified: Secondary | ICD-10-CM | POA: Diagnosis not present

## 2023-04-02 DIAGNOSIS — C9000 Multiple myeloma not having achieved remission: Secondary | ICD-10-CM | POA: Diagnosis not present

## 2023-04-02 DIAGNOSIS — G893 Neoplasm related pain (acute) (chronic): Secondary | ICD-10-CM | POA: Diagnosis not present

## 2023-04-02 DIAGNOSIS — N1831 Chronic kidney disease, stage 3a: Secondary | ICD-10-CM | POA: Diagnosis not present

## 2023-04-02 DIAGNOSIS — D630 Anemia in neoplastic disease: Secondary | ICD-10-CM | POA: Diagnosis not present

## 2023-04-02 DIAGNOSIS — C7951 Secondary malignant neoplasm of bone: Secondary | ICD-10-CM | POA: Diagnosis not present

## 2023-04-02 DIAGNOSIS — D631 Anemia in chronic kidney disease: Secondary | ICD-10-CM | POA: Diagnosis not present

## 2023-04-03 DIAGNOSIS — R062 Wheezing: Secondary | ICD-10-CM | POA: Diagnosis not present

## 2023-04-08 DIAGNOSIS — D630 Anemia in neoplastic disease: Secondary | ICD-10-CM | POA: Diagnosis not present

## 2023-04-08 DIAGNOSIS — C9000 Multiple myeloma not having achieved remission: Secondary | ICD-10-CM | POA: Diagnosis not present

## 2023-04-08 DIAGNOSIS — G893 Neoplasm related pain (acute) (chronic): Secondary | ICD-10-CM | POA: Diagnosis not present

## 2023-04-08 DIAGNOSIS — C7951 Secondary malignant neoplasm of bone: Secondary | ICD-10-CM | POA: Diagnosis not present

## 2023-04-08 DIAGNOSIS — D631 Anemia in chronic kidney disease: Secondary | ICD-10-CM | POA: Diagnosis not present

## 2023-04-08 DIAGNOSIS — N1831 Chronic kidney disease, stage 3a: Secondary | ICD-10-CM | POA: Diagnosis not present

## 2023-04-09 DIAGNOSIS — C9000 Multiple myeloma not having achieved remission: Secondary | ICD-10-CM | POA: Diagnosis not present

## 2023-04-09 DIAGNOSIS — D631 Anemia in chronic kidney disease: Secondary | ICD-10-CM | POA: Diagnosis not present

## 2023-04-09 DIAGNOSIS — D630 Anemia in neoplastic disease: Secondary | ICD-10-CM | POA: Diagnosis not present

## 2023-04-09 DIAGNOSIS — C7951 Secondary malignant neoplasm of bone: Secondary | ICD-10-CM | POA: Diagnosis not present

## 2023-04-09 DIAGNOSIS — G893 Neoplasm related pain (acute) (chronic): Secondary | ICD-10-CM | POA: Diagnosis not present

## 2023-04-09 DIAGNOSIS — N1831 Chronic kidney disease, stage 3a: Secondary | ICD-10-CM | POA: Diagnosis not present

## 2023-04-10 DIAGNOSIS — D631 Anemia in chronic kidney disease: Secondary | ICD-10-CM | POA: Diagnosis not present

## 2023-04-10 DIAGNOSIS — G893 Neoplasm related pain (acute) (chronic): Secondary | ICD-10-CM | POA: Diagnosis not present

## 2023-04-10 DIAGNOSIS — N1831 Chronic kidney disease, stage 3a: Secondary | ICD-10-CM | POA: Diagnosis not present

## 2023-04-10 DIAGNOSIS — C7951 Secondary malignant neoplasm of bone: Secondary | ICD-10-CM | POA: Diagnosis not present

## 2023-04-10 DIAGNOSIS — D630 Anemia in neoplastic disease: Secondary | ICD-10-CM | POA: Diagnosis not present

## 2023-04-10 DIAGNOSIS — C9000 Multiple myeloma not having achieved remission: Secondary | ICD-10-CM | POA: Diagnosis not present

## 2023-04-15 ENCOUNTER — Inpatient Hospital Stay: Payer: Medicare Other | Attending: Internal Medicine

## 2023-04-15 ENCOUNTER — Encounter: Payer: Self-pay | Admitting: Internal Medicine

## 2023-04-15 DIAGNOSIS — Z79899 Other long term (current) drug therapy: Secondary | ICD-10-CM | POA: Insufficient documentation

## 2023-04-15 DIAGNOSIS — N1831 Chronic kidney disease, stage 3a: Secondary | ICD-10-CM | POA: Diagnosis not present

## 2023-04-15 DIAGNOSIS — Z7982 Long term (current) use of aspirin: Secondary | ICD-10-CM | POA: Insufficient documentation

## 2023-04-15 DIAGNOSIS — Z87440 Personal history of urinary (tract) infections: Secondary | ICD-10-CM | POA: Diagnosis not present

## 2023-04-15 DIAGNOSIS — Z7969 Long term (current) use of other immunomodulators and immunosuppressants: Secondary | ICD-10-CM | POA: Insufficient documentation

## 2023-04-15 DIAGNOSIS — C9000 Multiple myeloma not having achieved remission: Secondary | ICD-10-CM | POA: Insufficient documentation

## 2023-04-15 DIAGNOSIS — Z9221 Personal history of antineoplastic chemotherapy: Secondary | ICD-10-CM | POA: Insufficient documentation

## 2023-04-15 DIAGNOSIS — R531 Weakness: Secondary | ICD-10-CM | POA: Diagnosis not present

## 2023-04-15 DIAGNOSIS — Z8546 Personal history of malignant neoplasm of prostate: Secondary | ICD-10-CM | POA: Diagnosis not present

## 2023-04-15 DIAGNOSIS — Z7961 Long term (current) use of immunomodulator: Secondary | ICD-10-CM | POA: Diagnosis not present

## 2023-04-15 DIAGNOSIS — C7951 Secondary malignant neoplasm of bone: Secondary | ICD-10-CM | POA: Diagnosis not present

## 2023-04-15 DIAGNOSIS — Z923 Personal history of irradiation: Secondary | ICD-10-CM | POA: Insufficient documentation

## 2023-04-15 DIAGNOSIS — D630 Anemia in neoplastic disease: Secondary | ICD-10-CM | POA: Diagnosis not present

## 2023-04-15 DIAGNOSIS — D631 Anemia in chronic kidney disease: Secondary | ICD-10-CM | POA: Diagnosis not present

## 2023-04-15 DIAGNOSIS — G893 Neoplasm related pain (acute) (chronic): Secondary | ICD-10-CM | POA: Diagnosis not present

## 2023-04-15 LAB — CMP (CANCER CENTER ONLY)
ALT: 14 U/L (ref 0–44)
AST: 19 U/L (ref 15–41)
Albumin: 3.9 g/dL (ref 3.5–5.0)
Alkaline Phosphatase: 80 U/L (ref 38–126)
Anion gap: 5 (ref 5–15)
BUN: 25 mg/dL — ABNORMAL HIGH (ref 8–23)
CO2: 30 mmol/L (ref 22–32)
Calcium: 9.4 mg/dL (ref 8.9–10.3)
Chloride: 104 mmol/L (ref 98–111)
Creatinine: 0.93 mg/dL (ref 0.61–1.24)
GFR, Estimated: >60 mL/min (ref >60)
Glucose, Bld: 101 mg/dL — ABNORMAL HIGH (ref 70–99)
Potassium: 4 mmol/L (ref 3.5–5.1)
Sodium: 139 mmol/L (ref 135–145)
Total Bilirubin: 0.4 mg/dL (ref 0.3–1.2)
Total Protein: 7.3 g/dL (ref 6.5–8.1)

## 2023-04-15 LAB — CBC WITH DIFFERENTIAL (CANCER CENTER ONLY)
Abs Immature Granulocytes: 0.02 10*3/uL (ref 0.00–0.07)
Basophils Absolute: 0 10*3/uL (ref 0.0–0.1)
Basophils Relative: 0 %
Eosinophils Absolute: 0.2 10*3/uL (ref 0.0–0.5)
Eosinophils Relative: 3 %
HCT: 38 % — ABNORMAL LOW (ref 39.0–52.0)
Hemoglobin: 12.5 g/dL — ABNORMAL LOW (ref 13.0–17.0)
Immature Granulocytes: 0 %
Lymphocytes Relative: 11 %
Lymphs Abs: 0.8 10*3/uL (ref 0.7–4.0)
MCH: 29.6 pg (ref 26.0–34.0)
MCHC: 32.9 g/dL (ref 30.0–36.0)
MCV: 90 fL (ref 80.0–100.0)
Monocytes Absolute: 0.4 10*3/uL (ref 0.1–1.0)
Monocytes Relative: 6 %
Neutro Abs: 5.3 10*3/uL (ref 1.7–7.7)
Neutrophils Relative %: 80 %
Platelet Count: 191 10*3/uL (ref 150–400)
RBC: 4.22 MIL/uL (ref 4.22–5.81)
RDW: 16 % — ABNORMAL HIGH (ref 11.5–15.5)
WBC Count: 6.6 10*3/uL (ref 4.0–10.5)
nRBC: 0 % (ref 0.0–0.2)

## 2023-04-15 LAB — LACTATE DEHYDROGENASE: LDH: 156 U/L (ref 98–192)

## 2023-04-16 DIAGNOSIS — C9000 Multiple myeloma not having achieved remission: Secondary | ICD-10-CM | POA: Diagnosis not present

## 2023-04-16 DIAGNOSIS — G893 Neoplasm related pain (acute) (chronic): Secondary | ICD-10-CM | POA: Diagnosis not present

## 2023-04-16 DIAGNOSIS — D631 Anemia in chronic kidney disease: Secondary | ICD-10-CM | POA: Diagnosis not present

## 2023-04-16 DIAGNOSIS — D630 Anemia in neoplastic disease: Secondary | ICD-10-CM | POA: Diagnosis not present

## 2023-04-16 DIAGNOSIS — N1831 Chronic kidney disease, stage 3a: Secondary | ICD-10-CM | POA: Diagnosis not present

## 2023-04-16 DIAGNOSIS — C7951 Secondary malignant neoplasm of bone: Secondary | ICD-10-CM | POA: Diagnosis not present

## 2023-04-16 LAB — KAPPA/LAMBDA LIGHT CHAINS
Kappa free light chain: 40.8 mg/L — ABNORMAL HIGH (ref 3.3–19.4)
Kappa, lambda light chain ratio: 1.8 — ABNORMAL HIGH (ref 0.26–1.65)
Lambda free light chains: 22.7 mg/L (ref 5.7–26.3)

## 2023-04-16 LAB — IGG, IGA, IGM
IgA: 217 mg/dL (ref 61–437)
IgG (Immunoglobin G), Serum: 1312 mg/dL (ref 603–1613)
IgM (Immunoglobulin M), Srm: 81 mg/dL (ref 15–143)

## 2023-04-16 LAB — BETA 2 MICROGLOBULIN, SERUM: Beta-2 Microglobulin: 3.2 mg/L — ABNORMAL HIGH (ref 0.6–2.4)

## 2023-04-20 DIAGNOSIS — F419 Anxiety disorder, unspecified: Secondary | ICD-10-CM | POA: Diagnosis not present

## 2023-04-20 DIAGNOSIS — S40911D Unspecified superficial injury of right shoulder, subsequent encounter: Secondary | ICD-10-CM | POA: Diagnosis not present

## 2023-04-20 DIAGNOSIS — R29898 Other symptoms and signs involving the musculoskeletal system: Secondary | ICD-10-CM | POA: Diagnosis not present

## 2023-04-20 DIAGNOSIS — I69320 Aphasia following cerebral infarction: Secondary | ICD-10-CM | POA: Diagnosis not present

## 2023-04-20 DIAGNOSIS — C7951 Secondary malignant neoplasm of bone: Secondary | ICD-10-CM | POA: Diagnosis not present

## 2023-04-20 DIAGNOSIS — N1831 Chronic kidney disease, stage 3a: Secondary | ICD-10-CM | POA: Diagnosis not present

## 2023-04-20 DIAGNOSIS — G893 Neoplasm related pain (acute) (chronic): Secondary | ICD-10-CM | POA: Diagnosis not present

## 2023-04-20 DIAGNOSIS — C9000 Multiple myeloma not having achieved remission: Secondary | ICD-10-CM | POA: Diagnosis not present

## 2023-04-20 DIAGNOSIS — I350 Nonrheumatic aortic (valve) stenosis: Secondary | ICD-10-CM | POA: Diagnosis not present

## 2023-04-20 DIAGNOSIS — I69398 Other sequelae of cerebral infarction: Secondary | ICD-10-CM | POA: Diagnosis not present

## 2023-04-20 DIAGNOSIS — I69322 Dysarthria following cerebral infarction: Secondary | ICD-10-CM | POA: Diagnosis not present

## 2023-04-20 DIAGNOSIS — J479 Bronchiectasis, uncomplicated: Secondary | ICD-10-CM | POA: Diagnosis not present

## 2023-04-20 DIAGNOSIS — G8220 Paraplegia, unspecified: Secondary | ICD-10-CM | POA: Diagnosis not present

## 2023-04-20 DIAGNOSIS — I69341 Monoplegia of lower limb following cerebral infarction affecting right dominant side: Secondary | ICD-10-CM | POA: Diagnosis not present

## 2023-04-20 DIAGNOSIS — I129 Hypertensive chronic kidney disease with stage 1 through stage 4 chronic kidney disease, or unspecified chronic kidney disease: Secondary | ICD-10-CM | POA: Diagnosis not present

## 2023-04-20 DIAGNOSIS — D630 Anemia in neoplastic disease: Secondary | ICD-10-CM | POA: Diagnosis not present

## 2023-04-20 DIAGNOSIS — I700 Atherosclerosis of aorta: Secondary | ICD-10-CM | POA: Diagnosis not present

## 2023-04-20 DIAGNOSIS — I69391 Dysphagia following cerebral infarction: Secondary | ICD-10-CM | POA: Diagnosis not present

## 2023-04-20 DIAGNOSIS — J90 Pleural effusion, not elsewhere classified: Secondary | ICD-10-CM | POA: Diagnosis not present

## 2023-04-20 DIAGNOSIS — E039 Hypothyroidism, unspecified: Secondary | ICD-10-CM | POA: Diagnosis not present

## 2023-04-20 DIAGNOSIS — N400 Enlarged prostate without lower urinary tract symptoms: Secondary | ICD-10-CM | POA: Diagnosis not present

## 2023-04-20 DIAGNOSIS — K219 Gastro-esophageal reflux disease without esophagitis: Secondary | ICD-10-CM | POA: Diagnosis not present

## 2023-04-20 DIAGNOSIS — D631 Anemia in chronic kidney disease: Secondary | ICD-10-CM | POA: Diagnosis not present

## 2023-04-20 DIAGNOSIS — M40204 Unspecified kyphosis, thoracic region: Secondary | ICD-10-CM | POA: Diagnosis not present

## 2023-04-20 DIAGNOSIS — R1312 Dysphagia, oropharyngeal phase: Secondary | ICD-10-CM | POA: Diagnosis not present

## 2023-04-22 ENCOUNTER — Ambulatory Visit: Payer: TRICARE For Life (TFL)

## 2023-04-22 ENCOUNTER — Ambulatory Visit: Payer: TRICARE For Life (TFL) | Admitting: Internal Medicine

## 2023-04-22 ENCOUNTER — Inpatient Hospital Stay: Payer: Medicare Other

## 2023-04-22 ENCOUNTER — Inpatient Hospital Stay (HOSPITAL_BASED_OUTPATIENT_CLINIC_OR_DEPARTMENT_OTHER): Payer: Medicare Other | Admitting: Internal Medicine

## 2023-04-22 VITALS — BP 85/69 | HR 84 | Temp 97.6°F | Resp 15 | Ht 69.0 in

## 2023-04-22 DIAGNOSIS — C7951 Secondary malignant neoplasm of bone: Secondary | ICD-10-CM

## 2023-04-22 DIAGNOSIS — Z87440 Personal history of urinary (tract) infections: Secondary | ICD-10-CM | POA: Diagnosis not present

## 2023-04-22 DIAGNOSIS — R531 Weakness: Secondary | ICD-10-CM | POA: Diagnosis not present

## 2023-04-22 DIAGNOSIS — C9000 Multiple myeloma not having achieved remission: Secondary | ICD-10-CM

## 2023-04-22 DIAGNOSIS — Z8546 Personal history of malignant neoplasm of prostate: Secondary | ICD-10-CM | POA: Diagnosis not present

## 2023-04-22 DIAGNOSIS — Z9221 Personal history of antineoplastic chemotherapy: Secondary | ICD-10-CM | POA: Diagnosis not present

## 2023-04-22 DIAGNOSIS — Z923 Personal history of irradiation: Secondary | ICD-10-CM | POA: Diagnosis not present

## 2023-04-22 MED ORDER — SODIUM CHLORIDE 0.9 % IV SOLN: INTRAVENOUS | Status: DC

## 2023-04-22 MED ORDER — ZOLEDRONIC ACID 4 MG/100ML IV SOLN
4.0000 mg | Freq: Once | INTRAVENOUS | Status: AC
  Administered 2023-04-22: 4 mg via INTRAVENOUS
  Filled 2023-04-22: qty 100

## 2023-04-22 NOTE — Patient Instructions (Signed)

## 2023-04-22 NOTE — Progress Notes (Signed)
[facility] [phone]   [phone]  OFFICE PROGRESS NOTE  Georgann Housekeeper, MD [address] [place] [address]  DIAGNOSIS: Multiple myeloma diagnosed in November 2015.  PRIOR THERAPY: 1) Status post Thoracic five laminectomy for epidural tumor resection, Thoracic 4-7 posterior lateral arthrodesis,   segmental pedicle screw fixation T4-T7 Globus instrumentation under the care of Dr. Franky Macho on 06/18/2014. 2) Systemic chemotherapy with Velcade 1.3 MG/M2 subcutaneously weekly in addition to Decadron 40 mg by mouth on weekly basis. Status post 11 cycles. 3) palliative radiotherapy to the right proximal humerus, shoulder and scapula under the care of Dr. Mitzi Hansen completed on 12/12/2014. 4)  Systemic chemotherapy with Velcade 1.3 MG/M2 subcutaneously weekly, Decadron 40 mg by mouth weekly in addition to Revlimid 25 MG by mouth daily for 21 days every 4 weeks, status post 14 cycles.   CURRENT THERAPY: Zometa 4 mg IV every 12 weeks. First dose was given 11/01/2014.  This is changed to every 6 months starting October 22, 2022.  INTERVAL HISTORY: Jeremiah Owens 89 y.o. male Discussed the use of AI scribe software for clinical note transcription with the patient, who gave verbal consent to proceed.  History of Present Illness   Mr. Stachurski, a patient with a history of multiple myeloma diagnosed in 2015, presents with ongoing weakness, particularly in the legs. The patient's wife reports that despite efforts, the patient is not yet able to stand independently. Over the past two months, the patient has experienced two bouts of pneumonia, suspected to be aspiration in nature, affecting the lower right lung. Both episodes were managed as an outpatient with Cefdinir. The patient also had two to three urinary tract infections in close succession, which may be related to inadequate fluid intake.  Despite these challenges, the patient's multiple myeloma  remains under control, with no evidence of progression based on the most recent myeloma panel.       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  MEDICATIONS:  Current Outpatient Medications  Medication Sig Dispense Refill   aspirin 81 MG EC tablet Take 81 mg by mouth daily.  0   Calcium Carbonate-Vitamin D (CALTRATE 600+D PO) Take 600 mg by mouth 2 (two) times daily.     cholecalciferol (VITAMIN D) 1000 UNITS tablet Take 1,000 Units by mouth at bedtime.      furosemide (LASIX) 20 MG tablet Take 20 mg by mouth as directed. Take 1 tablets by mouth every, Wednesday, Friday, Saturday, Sunday and 2 tablets by mouth Tuesday and Thursday     hydrocortisone 2.5 % lotion Apply 1 application  topically at bedtime as needed (apply to redness.rash daily prn).     KLOR-CON 10 10 MEQ tablet Take 10 mEq by mouth daily.     levothyroxine (SYNTHROID) 125 MCG tablet 1 tablet in the morning on an empty stomach  Multiple Vitamins-Minerals (CENTRUM SILVER PO) Take 1 tablet by mouth daily.     Nutritional Supplement LIQD Take 1 each by mouth daily. Med Pass 2.0     omeprazole (PRILOSEC) 40 MG capsule Take 40 mg by mouth daily as needed (heartburn).      saccharomyces boulardii (FLORASTOR) 250 MG capsule Take 250 mg by mouth daily.     tamsulosin (FLOMAX) 0.4 MG CAPS capsule Take 1 capsule (0.4 mg total) by mouth at bedtime. 30 capsule 3   TEXACORT 2.5 % SOLN Apply 1 Application topically daily as needed (for  scalp).  2   No current facility-administered medications for this visit.   Facility-Administered Medications Ordered in Other Visits  Medication Dose Route Frequency Provider Last Rate Last Admin   sodium chloride 0.9 % injection 10 mL  10 mL Intracatheter PRN Si Gaul, MD        SURGICAL HISTORY:  Past Surgical History:  Procedure Laterality Date   HERNIA REPAIR     IR THORACENTESIS ASP PLEURAL SPACE W/IMG GUIDE  07/07/2022   POSTERIOR CERVICAL FUSION/FORAMINOTOMY N/A 06/17/2014   Procedure: Thoracic five laminectomy for epidural tumor resection Thoracic 4-7 posterior lateral arthrodesis, segmental pedicle screw fixation.;  Surgeon: Coletta Memos, MD;  Location: [facility];  Service: Neurosurgery;  Laterality: N/A;    REVIEW OF SYSTEMS:  A comprehensive review of systems was negative except for: Constitutional: positive for fatigue Ears, nose, mouth, throat, and face: positive for hearing loss Musculoskeletal: positive for arthralgias and muscle weakness   PHYSICAL EXAMINATION: General appearance: alert, cooperative, fatigued, and no distress Head: Normocephalic, without obvious abnormality, atraumatic Neck: no adenopathy, no JVD, supple, symmetrical, trachea midline, and thyroid not enlarged, symmetric, no tenderness/mass/nodules Lymph nodes: Cervical, supraclavicular, and axillary nodes normal. Resp: clear to auscultation bilaterally Back: symmetric, no curvature. ROM normal. No CVA tenderness. Cardio: regular rate and rhythm, S1, S2 normal, no murmur, click, rub or gallop GI: soft, non-tender; bowel sounds normal; no masses,  no organomegaly Extremities: extremities normal, atraumatic, no cyanosis or edema  ECOG PERFORMANCE STATUS: 1 - Symptomatic but completely ambulatory  Blood pressure (!) 85/69, pulse 84, temperature 97.6 F (36.4 C), temperature source Oral, resp. rate 15, height 5\' 9"  (1.753 m), SpO2 100%.  LABORATORY DATA: Lab Results  Component Value Date    WBC 6.6 04/15/2023   HGB 12.5 (L) 04/15/2023   HCT 38.0 (L) 04/15/2023   MCV 90.0 04/15/2023   PLT 191 04/15/2023      Chemistry      Component Value Date/Time   NA 139 04/15/2023 1500   NA 141 07/15/2017 0949   K 4.0 04/15/2023 1500   K 4.4 07/15/2017 0949   CL 104 04/15/2023 1500   CO2 30 04/15/2023 1500   CO2 27 07/15/2017 0949   BUN 25 (H) 04/15/2023 1500   BUN 18.0 07/15/2017 0949   CREATININE 0.93 04/15/2023 1500   CREATININE 1.1 07/15/2017 0949      Component Value Date/Time   CALCIUM 9.4 04/15/2023 1500   CALCIUM 9.2 07/15/2017 0949   ALKPHOS 80 04/15/2023 1500   ALKPHOS 56 07/15/2017 0949   AST 19 04/15/2023 1500   AST 20 07/15/2017 0949   ALT 14 04/15/2023 1500   ALT 16 07/15/2017 0949   BILITOT 0.4 04/15/2023 1500   BILITOT 0.61 07/15/2017 0949       RADIOGRAPHIC STUDIES: No results found.  ASSESSMENT AND PLAN:  This is a very pleasant 89 years old white male with history of multiple  myeloma diagnosed in November 2015 status post several chemotherapy regimens as well as radiation. The patient has been in observation for more than 9 years now.    Multiple Myeloma Stable disease since 2015, currently on observation. Recent myeloma panel shows no evidence of disease progression. -Continue current management plan with observation and Zometa infusions every six months. -Repeat myeloma panel one week prior to next appointment in six months.  Recurrent Pneumonia Two episodes in the past two months, likely aspiration in nature. Treated as outpatient with Cefdinir. -Encourage patient to follow speech therapist's guidance to reduce risk of aspiration. -Continue outpatient management for any future episodes unless condition worsens.  Recurrent Urinary Tract Infections Two to three recent episodes, possibly related to inadequate fluid intake. -Encourage increased water intake to prevent future UTIs.  Generalized Weakness Ongoing, particularly in the  legs. -No specific plan discussed, continue current management and monitor.     The patient voices understanding of current disease status and treatment options and is in agreement with the current care plan.  All questions were answered. The patient knows to call the clinic with any problems, questions or concerns. We can certainly see the patient much sooner if necessary.  Disclaimer: This note was dictated with voice recognition software. Similar sounding words can inadvertently be transcribed and may not be corrected upon review.

## 2023-04-24 DIAGNOSIS — C7951 Secondary malignant neoplasm of bone: Secondary | ICD-10-CM | POA: Diagnosis not present

## 2023-04-24 DIAGNOSIS — G893 Neoplasm related pain (acute) (chronic): Secondary | ICD-10-CM | POA: Diagnosis not present

## 2023-04-24 DIAGNOSIS — N1831 Chronic kidney disease, stage 3a: Secondary | ICD-10-CM | POA: Diagnosis not present

## 2023-04-24 DIAGNOSIS — C9000 Multiple myeloma not having achieved remission: Secondary | ICD-10-CM | POA: Diagnosis not present

## 2023-04-24 DIAGNOSIS — I129 Hypertensive chronic kidney disease with stage 1 through stage 4 chronic kidney disease, or unspecified chronic kidney disease: Secondary | ICD-10-CM | POA: Diagnosis not present

## 2023-04-24 DIAGNOSIS — D630 Anemia in neoplastic disease: Secondary | ICD-10-CM | POA: Diagnosis not present

## 2023-04-27 DIAGNOSIS — I129 Hypertensive chronic kidney disease with stage 1 through stage 4 chronic kidney disease, or unspecified chronic kidney disease: Secondary | ICD-10-CM | POA: Diagnosis not present

## 2023-04-27 DIAGNOSIS — C9000 Multiple myeloma not having achieved remission: Secondary | ICD-10-CM | POA: Diagnosis not present

## 2023-04-27 DIAGNOSIS — G893 Neoplasm related pain (acute) (chronic): Secondary | ICD-10-CM | POA: Diagnosis not present

## 2023-04-27 DIAGNOSIS — N1831 Chronic kidney disease, stage 3a: Secondary | ICD-10-CM | POA: Diagnosis not present

## 2023-04-27 DIAGNOSIS — C7951 Secondary malignant neoplasm of bone: Secondary | ICD-10-CM | POA: Diagnosis not present

## 2023-04-27 DIAGNOSIS — D630 Anemia in neoplastic disease: Secondary | ICD-10-CM | POA: Diagnosis not present

## 2023-04-30 DIAGNOSIS — D630 Anemia in neoplastic disease: Secondary | ICD-10-CM | POA: Diagnosis not present

## 2023-04-30 DIAGNOSIS — G893 Neoplasm related pain (acute) (chronic): Secondary | ICD-10-CM | POA: Diagnosis not present

## 2023-04-30 DIAGNOSIS — C7951 Secondary malignant neoplasm of bone: Secondary | ICD-10-CM | POA: Diagnosis not present

## 2023-04-30 DIAGNOSIS — I129 Hypertensive chronic kidney disease with stage 1 through stage 4 chronic kidney disease, or unspecified chronic kidney disease: Secondary | ICD-10-CM | POA: Diagnosis not present

## 2023-04-30 DIAGNOSIS — C9000 Multiple myeloma not having achieved remission: Secondary | ICD-10-CM | POA: Diagnosis not present

## 2023-04-30 DIAGNOSIS — N1831 Chronic kidney disease, stage 3a: Secondary | ICD-10-CM | POA: Diagnosis not present

## 2023-05-01 DIAGNOSIS — N1831 Chronic kidney disease, stage 3a: Secondary | ICD-10-CM | POA: Diagnosis not present

## 2023-05-01 DIAGNOSIS — I129 Hypertensive chronic kidney disease with stage 1 through stage 4 chronic kidney disease, or unspecified chronic kidney disease: Secondary | ICD-10-CM | POA: Diagnosis not present

## 2023-05-01 DIAGNOSIS — C7951 Secondary malignant neoplasm of bone: Secondary | ICD-10-CM | POA: Diagnosis not present

## 2023-05-01 DIAGNOSIS — C9000 Multiple myeloma not having achieved remission: Secondary | ICD-10-CM | POA: Diagnosis not present

## 2023-05-01 DIAGNOSIS — D630 Anemia in neoplastic disease: Secondary | ICD-10-CM | POA: Diagnosis not present

## 2023-05-01 DIAGNOSIS — G893 Neoplasm related pain (acute) (chronic): Secondary | ICD-10-CM | POA: Diagnosis not present

## 2023-05-05 DIAGNOSIS — N1831 Chronic kidney disease, stage 3a: Secondary | ICD-10-CM | POA: Diagnosis not present

## 2023-05-05 DIAGNOSIS — D630 Anemia in neoplastic disease: Secondary | ICD-10-CM | POA: Diagnosis not present

## 2023-05-05 DIAGNOSIS — C9000 Multiple myeloma not having achieved remission: Secondary | ICD-10-CM | POA: Diagnosis not present

## 2023-05-05 DIAGNOSIS — C7951 Secondary malignant neoplasm of bone: Secondary | ICD-10-CM | POA: Diagnosis not present

## 2023-05-05 DIAGNOSIS — I129 Hypertensive chronic kidney disease with stage 1 through stage 4 chronic kidney disease, or unspecified chronic kidney disease: Secondary | ICD-10-CM | POA: Diagnosis not present

## 2023-05-05 DIAGNOSIS — G893 Neoplasm related pain (acute) (chronic): Secondary | ICD-10-CM | POA: Diagnosis not present

## 2023-05-07 DIAGNOSIS — D630 Anemia in neoplastic disease: Secondary | ICD-10-CM | POA: Diagnosis not present

## 2023-05-07 DIAGNOSIS — I129 Hypertensive chronic kidney disease with stage 1 through stage 4 chronic kidney disease, or unspecified chronic kidney disease: Secondary | ICD-10-CM | POA: Diagnosis not present

## 2023-05-07 DIAGNOSIS — C7951 Secondary malignant neoplasm of bone: Secondary | ICD-10-CM | POA: Diagnosis not present

## 2023-05-07 DIAGNOSIS — N1831 Chronic kidney disease, stage 3a: Secondary | ICD-10-CM | POA: Diagnosis not present

## 2023-05-07 DIAGNOSIS — C9000 Multiple myeloma not having achieved remission: Secondary | ICD-10-CM | POA: Diagnosis not present

## 2023-05-07 DIAGNOSIS — G893 Neoplasm related pain (acute) (chronic): Secondary | ICD-10-CM | POA: Diagnosis not present

## 2023-05-08 DIAGNOSIS — C7951 Secondary malignant neoplasm of bone: Secondary | ICD-10-CM | POA: Diagnosis not present

## 2023-05-08 DIAGNOSIS — I129 Hypertensive chronic kidney disease with stage 1 through stage 4 chronic kidney disease, or unspecified chronic kidney disease: Secondary | ICD-10-CM | POA: Diagnosis not present

## 2023-05-08 DIAGNOSIS — C9000 Multiple myeloma not having achieved remission: Secondary | ICD-10-CM | POA: Diagnosis not present

## 2023-05-08 DIAGNOSIS — N1831 Chronic kidney disease, stage 3a: Secondary | ICD-10-CM | POA: Diagnosis not present

## 2023-05-08 DIAGNOSIS — D630 Anemia in neoplastic disease: Secondary | ICD-10-CM | POA: Diagnosis not present

## 2023-05-08 DIAGNOSIS — G893 Neoplasm related pain (acute) (chronic): Secondary | ICD-10-CM | POA: Diagnosis not present

## 2023-05-12 DIAGNOSIS — N1831 Chronic kidney disease, stage 3a: Secondary | ICD-10-CM | POA: Diagnosis not present

## 2023-05-12 DIAGNOSIS — C9000 Multiple myeloma not having achieved remission: Secondary | ICD-10-CM | POA: Diagnosis not present

## 2023-05-12 DIAGNOSIS — C7951 Secondary malignant neoplasm of bone: Secondary | ICD-10-CM | POA: Diagnosis not present

## 2023-05-12 DIAGNOSIS — G893 Neoplasm related pain (acute) (chronic): Secondary | ICD-10-CM | POA: Diagnosis not present

## 2023-05-12 DIAGNOSIS — D630 Anemia in neoplastic disease: Secondary | ICD-10-CM | POA: Diagnosis not present

## 2023-05-12 DIAGNOSIS — I129 Hypertensive chronic kidney disease with stage 1 through stage 4 chronic kidney disease, or unspecified chronic kidney disease: Secondary | ICD-10-CM | POA: Diagnosis not present

## 2023-05-13 ENCOUNTER — Encounter: Payer: Medicare Other | Attending: Physical Medicine & Rehabilitation | Admitting: Physical Medicine & Rehabilitation

## 2023-05-13 ENCOUNTER — Encounter: Payer: Self-pay | Admitting: Physical Medicine & Rehabilitation

## 2023-05-13 VITALS — BP 115/65 | HR 81 | Ht 69.0 in

## 2023-05-13 DIAGNOSIS — R2689 Other abnormalities of gait and mobility: Secondary | ICD-10-CM | POA: Insufficient documentation

## 2023-05-13 DIAGNOSIS — I69391 Dysphagia following cerebral infarction: Secondary | ICD-10-CM | POA: Insufficient documentation

## 2023-05-13 DIAGNOSIS — C7951 Secondary malignant neoplasm of bone: Secondary | ICD-10-CM | POA: Diagnosis not present

## 2023-05-13 DIAGNOSIS — C9001 Multiple myeloma in remission: Secondary | ICD-10-CM | POA: Diagnosis not present

## 2023-05-13 NOTE — Progress Notes (Signed)
Subjective:    Patient ID: Jeremiah Owens, male    DOB: 01/23/1930, 89 y.o.   MRN: [id number]  HPI  This is a 1 year follow-up for Jeremiah Owens who is here in follow-up of his CVA and multifactorial gait disorder. He had a severe UTI last December which took a bit of the starch out of him. Additionally he's had recurrent pneumonia and bladder infections. He continues to work on walking and transferring with [facility] therapies. OT is still working with him. He is weak with transfers in particular. He eats well but the food "goes through him" per wife.  However stool is formed per his wife. He saw oncology last month and the report was good with stable disease.   He continues with drooling. Wife has thickened fluids which helped, but it doesn't necessarily help his intake.      Pain Inventory Average Pain 0 Pain Right Now 0 My pain is  No Pain  LOCATION OF PAIN  Shoulder  BOWEL Number of stools per week: 14   BLADDER Pads  Bladder incontinence Yes    Mobility walk without assistance walk with assistance use a walker how many minutes can you walk? 10 minutes w/ walker ability to climb steps?  no do you drive?  no use a wheelchair needs help with transfers  Function retired I need assistance with the following:  bathing and toileting  Neuro/Psych bladder control problems bowel control problems  Prior Studies Any changes since last visit?  no  Physicians involved in your care Any changes since last visit?  no   Family History  Problem Relation Age of Onset   Cancer Mother        breast   Stroke Mother    Social History   Socioeconomic History   Marital status: Married    Spouse name: Not on file   Number of children: Not on file   Years of education: Not on file   Highest education level: Not on file  Occupational History   Not on file  Tobacco Use   Smoking status: Never   Smokeless tobacco: Never  Vaping Use   Vaping status: Never Used  Substance and  Sexual Activity   Alcohol use: No    Alcohol/week: 0.0 standard drinks of alcohol   Drug use: No   Sexual activity: Not on file  Other Topics Concern   Not on file  Social History Narrative   Not on file   Social Determinants of Health   Financial Resource Strain: Not on file  Food Insecurity: No Food Insecurity (07/07/2022)   Hunger Vital Sign    Worried About Running Out of Food in the Last Year: Never true    Ran Out of Food in the Last Year: Never true  Transportation Needs: Not on file  Physical Activity: Not on file  Stress: Not on file  Social Connections: Unknown (12/17/2021)   Received from [facility], [facility]   Social Network    Social Network: Not on file   Past Surgical History:  Procedure Laterality Date   HERNIA REPAIR     IR THORACENTESIS ASP PLEURAL SPACE W/IMG GUIDE  07/07/2022   POSTERIOR CERVICAL FUSION/FORAMINOTOMY N/A 06/17/2014   Procedure: Thoracic five laminectomy for epidural tumor resection Thoracic 4-7 posterior lateral arthrodesis, segmental pedicle screw fixation.;  Surgeon: Coletta Memos, MD;  Location: [facility];  Service: Neurosurgery;  Laterality: N/A;   Past Medical History:  Diagnosis Date   Allergy  Anxiety    Bone cancer (HCC)    t_spine T-5   Compression fracture    Encounter for antineoplastic chemotherapy 01/31/2015   Hearing loss    Melanoma (HCC)    Multiple myeloma (HCC)    Prostate cancer (HCC) 2002   S/P radiation therapy 08/21/14-09/04/14   T4-6 25Gy/4fx   S/P radiation therapy 11/29/14-12/12/14   rt prox humerus/shoulder/scapula 25Gy/54fx   Skin cancer 1994   melanoma  right neck    Stroke (HCC)    Thyroid disease    Pulse 81   Ht 5\' 9"  (1.753 m)   SpO2 91%   BMI 21.12 kg/m   Opioid Risk Score:   Fall Risk Score:  `1  Depression screen PHQ 2/9     05/13/2023   10:14 AM 05/07/2022   10:56 AM 05/02/2020    1:44 PM 04/10/2015   10:51 AM 11/07/2014   12:09 PM  Depression screen PHQ 2/9  Decreased Interest  0 0 0 0 0  Down, Depressed, Hopeless 0 0 0 0 0  PHQ - 2 Score 0 0 0 0 0  Altered sleeping   0 1 0  Tired, decreased energy   1 1 2   Change in appetite   0 0 0  Feeling bad or failure about yourself    0 0 0  Trouble concentrating   1 0 1  Moving slowly or fidgety/restless   0 0 0  Suicidal thoughts   0 0 0  PHQ-9 Score   2 2 3   Difficult doing work/chores   Not difficult at all        Review of Systems  All other systems reviewed and are negative.     Objective:   Physical Exam General: No acute distress. Appears to have lost weight HEENT: NCAT, EOMI, oral membranes moist Cards: reg rate  Chest: normal effort Abdomen: Soft, NT, ND Skin: dry, intact Extremities: no edema Psych: pleasant and appropriate, flat  Neuro:  Fair insight. Speech very slurred.not too verbal today. Doesn't have hearing aid. Right central 7, minor drooling. Slower processing. Fair  Memory. Normal language and speech. Cranial nerve exam unremarkable.     RLE 4/5 LLE 4+/5. RUE 5/5. LUE limited. Gait more shuffling, head more forward than previous time I saw.   Musculoskeletal: about 50-70 degrees of passive Left shoulder abduction with pain.            Assessment & Plan:  1.  History of left basal ganglia infarct with right hemiparesis 2.  History of multiple myeloma with thoracic spinal mets 3.  History of BPH 4.  Dysphagia d/t CVA 5.  Multifactorial gait disorder related to the above in addition to his significant kyphotic posture and adhesive capsulitis in the left shoulder.    Further decline with pneumonias and UTI's he's experienced over the last year.    Plan:  1.  Therapies with HH as possible: still has OT            -may benefit from a privately hired therapist once [facility] therapy expires.  -continue daily transfer training.  2.  Swallowing              -continue handkerchief for saliva  -thickner for fluids---needs to drink more though!             -frequent swallowing to clear  secretions   -diarrhea seems better, may not be emptying completely 3.  Reviewed DME, installed equipment, etc  -they have a  hoyer lift but is manual. Wife struggles to use. Wrote an rx for an Sport and exercise psychologist to make transfers safer for both.                  4. Continue with leisure activities and stimulation for fatigue as possible    21 minutes of direct patient time was spent in the office today.  I will see him back here in about a year if needed.

## 2023-05-13 NOTE — Patient Instructions (Signed)
ALWAYS FEEL FREE TO CALL OUR OFFICE WITH ANY PROBLEMS OR QUESTIONS [phone])  **PLEASE NOTE** ALL MEDICATION REFILL REQUESTS (INCLUDING CONTROLLED SUBSTANCES) NEED TO BE MADE AT LEAST 7 DAYS PRIOR TO REFILL BEING DUE. ANY REFILL REQUESTS INSIDE THAT TIME FRAME MAY RESULT IN DELAYS IN RECEIVING YOUR PRESCRIPTION.                     MAKE SURE YOU'RE DRINKING ENOUGH.Marland Kitchen

## 2023-05-14 DIAGNOSIS — D630 Anemia in neoplastic disease: Secondary | ICD-10-CM | POA: Diagnosis not present

## 2023-05-14 DIAGNOSIS — G893 Neoplasm related pain (acute) (chronic): Secondary | ICD-10-CM | POA: Diagnosis not present

## 2023-05-14 DIAGNOSIS — N1831 Chronic kidney disease, stage 3a: Secondary | ICD-10-CM | POA: Diagnosis not present

## 2023-05-14 DIAGNOSIS — I129 Hypertensive chronic kidney disease with stage 1 through stage 4 chronic kidney disease, or unspecified chronic kidney disease: Secondary | ICD-10-CM | POA: Diagnosis not present

## 2023-05-14 DIAGNOSIS — C7951 Secondary malignant neoplasm of bone: Secondary | ICD-10-CM | POA: Diagnosis not present

## 2023-05-14 DIAGNOSIS — C9000 Multiple myeloma not having achieved remission: Secondary | ICD-10-CM | POA: Diagnosis not present

## 2023-05-15 DIAGNOSIS — I129 Hypertensive chronic kidney disease with stage 1 through stage 4 chronic kidney disease, or unspecified chronic kidney disease: Secondary | ICD-10-CM | POA: Diagnosis not present

## 2023-05-15 DIAGNOSIS — N1831 Chronic kidney disease, stage 3a: Secondary | ICD-10-CM | POA: Diagnosis not present

## 2023-05-15 DIAGNOSIS — C9000 Multiple myeloma not having achieved remission: Secondary | ICD-10-CM | POA: Diagnosis not present

## 2023-05-15 DIAGNOSIS — D630 Anemia in neoplastic disease: Secondary | ICD-10-CM | POA: Diagnosis not present

## 2023-05-15 DIAGNOSIS — C7951 Secondary malignant neoplasm of bone: Secondary | ICD-10-CM | POA: Diagnosis not present

## 2023-05-15 DIAGNOSIS — G893 Neoplasm related pain (acute) (chronic): Secondary | ICD-10-CM | POA: Diagnosis not present

## 2023-05-18 DIAGNOSIS — I129 Hypertensive chronic kidney disease with stage 1 through stage 4 chronic kidney disease, or unspecified chronic kidney disease: Secondary | ICD-10-CM | POA: Diagnosis not present

## 2023-05-18 DIAGNOSIS — C9000 Multiple myeloma not having achieved remission: Secondary | ICD-10-CM | POA: Diagnosis not present

## 2023-05-18 DIAGNOSIS — G893 Neoplasm related pain (acute) (chronic): Secondary | ICD-10-CM | POA: Diagnosis not present

## 2023-05-18 DIAGNOSIS — N1831 Chronic kidney disease, stage 3a: Secondary | ICD-10-CM | POA: Diagnosis not present

## 2023-05-18 DIAGNOSIS — C7951 Secondary malignant neoplasm of bone: Secondary | ICD-10-CM | POA: Diagnosis not present

## 2023-05-18 DIAGNOSIS — D630 Anemia in neoplastic disease: Secondary | ICD-10-CM | POA: Diagnosis not present

## 2023-05-19 DIAGNOSIS — C7951 Secondary malignant neoplasm of bone: Secondary | ICD-10-CM | POA: Diagnosis not present

## 2023-05-19 DIAGNOSIS — D630 Anemia in neoplastic disease: Secondary | ICD-10-CM | POA: Diagnosis not present

## 2023-05-19 DIAGNOSIS — N1831 Chronic kidney disease, stage 3a: Secondary | ICD-10-CM | POA: Diagnosis not present

## 2023-05-19 DIAGNOSIS — G893 Neoplasm related pain (acute) (chronic): Secondary | ICD-10-CM | POA: Diagnosis not present

## 2023-05-19 DIAGNOSIS — I129 Hypertensive chronic kidney disease with stage 1 through stage 4 chronic kidney disease, or unspecified chronic kidney disease: Secondary | ICD-10-CM | POA: Diagnosis not present

## 2023-05-19 DIAGNOSIS — C9000 Multiple myeloma not having achieved remission: Secondary | ICD-10-CM | POA: Diagnosis not present

## 2023-05-20 DIAGNOSIS — S40911D Unspecified superficial injury of right shoulder, subsequent encounter: Secondary | ICD-10-CM | POA: Diagnosis not present

## 2023-05-20 DIAGNOSIS — I129 Hypertensive chronic kidney disease with stage 1 through stage 4 chronic kidney disease, or unspecified chronic kidney disease: Secondary | ICD-10-CM | POA: Diagnosis not present

## 2023-05-20 DIAGNOSIS — G8220 Paraplegia, unspecified: Secondary | ICD-10-CM | POA: Diagnosis not present

## 2023-05-20 DIAGNOSIS — I69391 Dysphagia following cerebral infarction: Secondary | ICD-10-CM | POA: Diagnosis not present

## 2023-05-20 DIAGNOSIS — J90 Pleural effusion, not elsewhere classified: Secondary | ICD-10-CM | POA: Diagnosis not present

## 2023-05-20 DIAGNOSIS — R29898 Other symptoms and signs involving the musculoskeletal system: Secondary | ICD-10-CM | POA: Diagnosis not present

## 2023-05-20 DIAGNOSIS — I700 Atherosclerosis of aorta: Secondary | ICD-10-CM | POA: Diagnosis not present

## 2023-05-20 DIAGNOSIS — I69320 Aphasia following cerebral infarction: Secondary | ICD-10-CM | POA: Diagnosis not present

## 2023-05-20 DIAGNOSIS — K219 Gastro-esophageal reflux disease without esophagitis: Secondary | ICD-10-CM | POA: Diagnosis not present

## 2023-05-20 DIAGNOSIS — F419 Anxiety disorder, unspecified: Secondary | ICD-10-CM | POA: Diagnosis not present

## 2023-05-20 DIAGNOSIS — G893 Neoplasm related pain (acute) (chronic): Secondary | ICD-10-CM | POA: Diagnosis not present

## 2023-05-20 DIAGNOSIS — R1312 Dysphagia, oropharyngeal phase: Secondary | ICD-10-CM | POA: Diagnosis not present

## 2023-05-20 DIAGNOSIS — I350 Nonrheumatic aortic (valve) stenosis: Secondary | ICD-10-CM | POA: Diagnosis not present

## 2023-05-20 DIAGNOSIS — N1831 Chronic kidney disease, stage 3a: Secondary | ICD-10-CM | POA: Diagnosis not present

## 2023-05-20 DIAGNOSIS — I69322 Dysarthria following cerebral infarction: Secondary | ICD-10-CM | POA: Diagnosis not present

## 2023-05-20 DIAGNOSIS — E039 Hypothyroidism, unspecified: Secondary | ICD-10-CM | POA: Diagnosis not present

## 2023-05-20 DIAGNOSIS — D631 Anemia in chronic kidney disease: Secondary | ICD-10-CM | POA: Diagnosis not present

## 2023-05-20 DIAGNOSIS — N400 Enlarged prostate without lower urinary tract symptoms: Secondary | ICD-10-CM | POA: Diagnosis not present

## 2023-05-20 DIAGNOSIS — C9000 Multiple myeloma not having achieved remission: Secondary | ICD-10-CM | POA: Diagnosis not present

## 2023-05-20 DIAGNOSIS — D630 Anemia in neoplastic disease: Secondary | ICD-10-CM | POA: Diagnosis not present

## 2023-05-20 DIAGNOSIS — M40204 Unspecified kyphosis, thoracic region: Secondary | ICD-10-CM | POA: Diagnosis not present

## 2023-05-20 DIAGNOSIS — J479 Bronchiectasis, uncomplicated: Secondary | ICD-10-CM | POA: Diagnosis not present

## 2023-05-20 DIAGNOSIS — C7951 Secondary malignant neoplasm of bone: Secondary | ICD-10-CM | POA: Diagnosis not present

## 2023-05-20 DIAGNOSIS — I69398 Other sequelae of cerebral infarction: Secondary | ICD-10-CM | POA: Diagnosis not present

## 2023-05-20 DIAGNOSIS — I69341 Monoplegia of lower limb following cerebral infarction affecting right dominant side: Secondary | ICD-10-CM | POA: Diagnosis not present

## 2023-05-25 DIAGNOSIS — C7951 Secondary malignant neoplasm of bone: Secondary | ICD-10-CM | POA: Diagnosis not present

## 2023-05-25 DIAGNOSIS — G893 Neoplasm related pain (acute) (chronic): Secondary | ICD-10-CM | POA: Diagnosis not present

## 2023-05-25 DIAGNOSIS — N1831 Chronic kidney disease, stage 3a: Secondary | ICD-10-CM | POA: Diagnosis not present

## 2023-05-25 DIAGNOSIS — D630 Anemia in neoplastic disease: Secondary | ICD-10-CM | POA: Diagnosis not present

## 2023-05-25 DIAGNOSIS — C9000 Multiple myeloma not having achieved remission: Secondary | ICD-10-CM | POA: Diagnosis not present

## 2023-05-25 DIAGNOSIS — I129 Hypertensive chronic kidney disease with stage 1 through stage 4 chronic kidney disease, or unspecified chronic kidney disease: Secondary | ICD-10-CM | POA: Diagnosis not present

## 2023-05-29 DIAGNOSIS — C9000 Multiple myeloma not having achieved remission: Secondary | ICD-10-CM | POA: Diagnosis not present

## 2023-05-29 DIAGNOSIS — G893 Neoplasm related pain (acute) (chronic): Secondary | ICD-10-CM | POA: Diagnosis not present

## 2023-05-29 DIAGNOSIS — C7951 Secondary malignant neoplasm of bone: Secondary | ICD-10-CM | POA: Diagnosis not present

## 2023-05-29 DIAGNOSIS — N1831 Chronic kidney disease, stage 3a: Secondary | ICD-10-CM | POA: Diagnosis not present

## 2023-05-29 DIAGNOSIS — D630 Anemia in neoplastic disease: Secondary | ICD-10-CM | POA: Diagnosis not present

## 2023-05-29 DIAGNOSIS — I129 Hypertensive chronic kidney disease with stage 1 through stage 4 chronic kidney disease, or unspecified chronic kidney disease: Secondary | ICD-10-CM | POA: Diagnosis not present

## 2023-06-01 DIAGNOSIS — C9000 Multiple myeloma not having achieved remission: Secondary | ICD-10-CM | POA: Diagnosis not present

## 2023-06-01 DIAGNOSIS — C7951 Secondary malignant neoplasm of bone: Secondary | ICD-10-CM | POA: Diagnosis not present

## 2023-06-01 DIAGNOSIS — N1831 Chronic kidney disease, stage 3a: Secondary | ICD-10-CM | POA: Diagnosis not present

## 2023-06-01 DIAGNOSIS — I129 Hypertensive chronic kidney disease with stage 1 through stage 4 chronic kidney disease, or unspecified chronic kidney disease: Secondary | ICD-10-CM | POA: Diagnosis not present

## 2023-06-01 DIAGNOSIS — G893 Neoplasm related pain (acute) (chronic): Secondary | ICD-10-CM | POA: Diagnosis not present

## 2023-06-01 DIAGNOSIS — D630 Anemia in neoplastic disease: Secondary | ICD-10-CM | POA: Diagnosis not present

## 2023-06-02 DIAGNOSIS — N390 Urinary tract infection, site not specified: Secondary | ICD-10-CM | POA: Diagnosis not present

## 2023-06-04 DIAGNOSIS — I129 Hypertensive chronic kidney disease with stage 1 through stage 4 chronic kidney disease, or unspecified chronic kidney disease: Secondary | ICD-10-CM | POA: Diagnosis not present

## 2023-06-04 DIAGNOSIS — C7951 Secondary malignant neoplasm of bone: Secondary | ICD-10-CM | POA: Diagnosis not present

## 2023-06-04 DIAGNOSIS — N1831 Chronic kidney disease, stage 3a: Secondary | ICD-10-CM | POA: Diagnosis not present

## 2023-06-04 DIAGNOSIS — C9000 Multiple myeloma not having achieved remission: Secondary | ICD-10-CM | POA: Diagnosis not present

## 2023-06-04 DIAGNOSIS — D630 Anemia in neoplastic disease: Secondary | ICD-10-CM | POA: Diagnosis not present

## 2023-06-04 DIAGNOSIS — G893 Neoplasm related pain (acute) (chronic): Secondary | ICD-10-CM | POA: Diagnosis not present

## 2023-06-08 DIAGNOSIS — C7951 Secondary malignant neoplasm of bone: Secondary | ICD-10-CM | POA: Diagnosis not present

## 2023-06-08 DIAGNOSIS — N1831 Chronic kidney disease, stage 3a: Secondary | ICD-10-CM | POA: Diagnosis not present

## 2023-06-08 DIAGNOSIS — I129 Hypertensive chronic kidney disease with stage 1 through stage 4 chronic kidney disease, or unspecified chronic kidney disease: Secondary | ICD-10-CM | POA: Diagnosis not present

## 2023-06-08 DIAGNOSIS — D630 Anemia in neoplastic disease: Secondary | ICD-10-CM | POA: Diagnosis not present

## 2023-06-08 DIAGNOSIS — C9000 Multiple myeloma not having achieved remission: Secondary | ICD-10-CM | POA: Diagnosis not present

## 2023-06-08 DIAGNOSIS — G893 Neoplasm related pain (acute) (chronic): Secondary | ICD-10-CM | POA: Diagnosis not present

## 2023-06-11 DIAGNOSIS — D630 Anemia in neoplastic disease: Secondary | ICD-10-CM | POA: Diagnosis not present

## 2023-06-11 DIAGNOSIS — G893 Neoplasm related pain (acute) (chronic): Secondary | ICD-10-CM | POA: Diagnosis not present

## 2023-06-11 DIAGNOSIS — C7951 Secondary malignant neoplasm of bone: Secondary | ICD-10-CM | POA: Diagnosis not present

## 2023-06-11 DIAGNOSIS — C9000 Multiple myeloma not having achieved remission: Secondary | ICD-10-CM | POA: Diagnosis not present

## 2023-06-11 DIAGNOSIS — N1831 Chronic kidney disease, stage 3a: Secondary | ICD-10-CM | POA: Diagnosis not present

## 2023-06-11 DIAGNOSIS — I129 Hypertensive chronic kidney disease with stage 1 through stage 4 chronic kidney disease, or unspecified chronic kidney disease: Secondary | ICD-10-CM | POA: Diagnosis not present

## 2023-06-12 DIAGNOSIS — C9000 Multiple myeloma not having achieved remission: Secondary | ICD-10-CM | POA: Diagnosis not present

## 2023-06-12 DIAGNOSIS — D630 Anemia in neoplastic disease: Secondary | ICD-10-CM | POA: Diagnosis not present

## 2023-06-12 DIAGNOSIS — N1831 Chronic kidney disease, stage 3a: Secondary | ICD-10-CM | POA: Diagnosis not present

## 2023-06-12 DIAGNOSIS — G893 Neoplasm related pain (acute) (chronic): Secondary | ICD-10-CM | POA: Diagnosis not present

## 2023-06-12 DIAGNOSIS — C7951 Secondary malignant neoplasm of bone: Secondary | ICD-10-CM | POA: Diagnosis not present

## 2023-06-12 DIAGNOSIS — I129 Hypertensive chronic kidney disease with stage 1 through stage 4 chronic kidney disease, or unspecified chronic kidney disease: Secondary | ICD-10-CM | POA: Diagnosis not present

## 2023-06-13 DIAGNOSIS — Z8701 Personal history of pneumonia (recurrent): Secondary | ICD-10-CM | POA: Diagnosis not present

## 2023-06-13 DIAGNOSIS — R918 Other nonspecific abnormal finding of lung field: Secondary | ICD-10-CM | POA: Diagnosis not present

## 2023-06-13 DIAGNOSIS — J90 Pleural effusion, not elsewhere classified: Secondary | ICD-10-CM | POA: Diagnosis not present

## 2023-06-14 ENCOUNTER — Other Ambulatory Visit: Payer: Self-pay

## 2023-06-14 ENCOUNTER — Inpatient Hospital Stay (HOSPITAL_COMMUNITY)
Admission: EM | Admit: 2023-06-14 | Discharge: 2023-06-18 | DRG: 871 | Disposition: A | Discharge status: Hospice | Payer: No Typology Code available for payment source | Attending: Internal Medicine | Admitting: Internal Medicine

## 2023-06-14 ENCOUNTER — Encounter (HOSPITAL_COMMUNITY): Payer: Self-pay

## 2023-06-14 ENCOUNTER — Emergency Department (HOSPITAL_COMMUNITY): Payer: No Typology Code available for payment source

## 2023-06-14 DIAGNOSIS — R0689 Other abnormalities of breathing: Secondary | ICD-10-CM | POA: Diagnosis not present

## 2023-06-14 DIAGNOSIS — R918 Other nonspecific abnormal finding of lung field: Secondary | ICD-10-CM | POA: Diagnosis not present

## 2023-06-14 DIAGNOSIS — R6520 Severe sepsis without septic shock: Secondary | ICD-10-CM | POA: Diagnosis present

## 2023-06-14 DIAGNOSIS — Z85820 Personal history of malignant melanoma of skin: Secondary | ICD-10-CM

## 2023-06-14 DIAGNOSIS — R509 Fever, unspecified: Secondary | ICD-10-CM | POA: Diagnosis not present

## 2023-06-14 DIAGNOSIS — Z9221 Personal history of antineoplastic chemotherapy: Secondary | ICD-10-CM | POA: Diagnosis not present

## 2023-06-14 DIAGNOSIS — Z66 Do not resuscitate: Secondary | ICD-10-CM | POA: Diagnosis present

## 2023-06-14 DIAGNOSIS — N390 Urinary tract infection, site not specified: Secondary | ICD-10-CM | POA: Diagnosis present

## 2023-06-14 DIAGNOSIS — E039 Hypothyroidism, unspecified: Secondary | ICD-10-CM

## 2023-06-14 DIAGNOSIS — H9190 Unspecified hearing loss, unspecified ear: Secondary | ICD-10-CM | POA: Diagnosis present

## 2023-06-14 DIAGNOSIS — N400 Enlarged prostate without lower urinary tract symptoms: Secondary | ICD-10-CM | POA: Diagnosis present

## 2023-06-14 DIAGNOSIS — Z79890 Hormone replacement therapy (postmenopausal): Secondary | ICD-10-CM | POA: Diagnosis not present

## 2023-06-14 DIAGNOSIS — C9001 Multiple myeloma in remission: Secondary | ICD-10-CM

## 2023-06-14 DIAGNOSIS — R627 Adult failure to thrive: Secondary | ICD-10-CM | POA: Diagnosis present

## 2023-06-14 DIAGNOSIS — R1310 Dysphagia, unspecified: Secondary | ICD-10-CM | POA: Diagnosis not present

## 2023-06-14 DIAGNOSIS — C9000 Multiple myeloma not having achieved remission: Secondary | ICD-10-CM | POA: Diagnosis present

## 2023-06-14 DIAGNOSIS — Z888 Allergy status to other drugs, medicaments and biological substances status: Secondary | ICD-10-CM

## 2023-06-14 DIAGNOSIS — Z515 Encounter for palliative care: Secondary | ICD-10-CM

## 2023-06-14 DIAGNOSIS — Z8673 Personal history of transient ischemic attack (TIA), and cerebral infarction without residual deficits: Secondary | ICD-10-CM

## 2023-06-14 DIAGNOSIS — J90 Pleural effusion, not elsewhere classified: Secondary | ICD-10-CM | POA: Diagnosis present

## 2023-06-14 DIAGNOSIS — Z8701 Personal history of pneumonia (recurrent): Secondary | ICD-10-CM

## 2023-06-14 DIAGNOSIS — Z7401 Bed confinement status: Secondary | ICD-10-CM | POA: Diagnosis not present

## 2023-06-14 DIAGNOSIS — Z981 Arthrodesis status: Secondary | ICD-10-CM

## 2023-06-14 DIAGNOSIS — Z803 Family history of malignant neoplasm of breast: Secondary | ICD-10-CM

## 2023-06-14 DIAGNOSIS — Z7982 Long term (current) use of aspirin: Secondary | ICD-10-CM | POA: Diagnosis not present

## 2023-06-14 DIAGNOSIS — R000 Tachycardia, unspecified: Secondary | ICD-10-CM | POA: Diagnosis not present

## 2023-06-14 DIAGNOSIS — R6521 Severe sepsis with septic shock: Secondary | ICD-10-CM | POA: Diagnosis not present

## 2023-06-14 DIAGNOSIS — Z823 Family history of stroke: Secondary | ICD-10-CM

## 2023-06-14 DIAGNOSIS — E872 Acidosis, unspecified: Secondary | ICD-10-CM | POA: Diagnosis present

## 2023-06-14 DIAGNOSIS — A419 Sepsis, unspecified organism: Secondary | ICD-10-CM | POA: Diagnosis not present

## 2023-06-14 DIAGNOSIS — Z79899 Other long term (current) drug therapy: Secondary | ICD-10-CM | POA: Diagnosis not present

## 2023-06-14 DIAGNOSIS — Z8546 Personal history of malignant neoplasm of prostate: Secondary | ICD-10-CM

## 2023-06-14 DIAGNOSIS — J690 Pneumonitis due to inhalation of food and vomit: Secondary | ICD-10-CM | POA: Diagnosis present

## 2023-06-14 DIAGNOSIS — Z85830 Personal history of malignant neoplasm of bone: Secondary | ICD-10-CM

## 2023-06-14 DIAGNOSIS — Z923 Personal history of irradiation: Secondary | ICD-10-CM

## 2023-06-14 DIAGNOSIS — J189 Pneumonia, unspecified organism: Secondary | ICD-10-CM

## 2023-06-14 LAB — COMPREHENSIVE METABOLIC PANEL
ALT: 20 U/L (ref 0–44)
AST: 35 U/L (ref 15–41)
Albumin: 3.4 g/dL — ABNORMAL LOW (ref 3.5–5.0)
Alkaline Phosphatase: 92 U/L (ref 38–126)
Anion gap: 14 (ref 5–15)
BUN: 27 mg/dL — ABNORMAL HIGH (ref 8–23)
CO2: 20 mmol/L — ABNORMAL LOW (ref 22–32)
Calcium: 8.9 mg/dL (ref 8.9–10.3)
Chloride: 107 mmol/L (ref 98–111)
Creatinine, Ser: 1.02 mg/dL (ref 0.61–1.24)
GFR, Estimated: >60 mL/min (ref >60)
Glucose, Bld: 116 mg/dL — ABNORMAL HIGH (ref 70–99)
Potassium: 4.5 mmol/L (ref 3.5–5.1)
Sodium: 141 mmol/L (ref 135–145)
Total Bilirubin: 0.9 mg/dL (ref <1.2)
Total Protein: 7.5 g/dL (ref 6.5–8.1)

## 2023-06-14 LAB — CBC WITH DIFFERENTIAL/PLATELET
Abs Immature Granulocytes: 0.03 10*3/uL (ref 0.00–0.07)
Basophils Absolute: 0 10*3/uL (ref 0.0–0.1)
Basophils Relative: 0 %
Eosinophils Absolute: 0.1 10*3/uL (ref 0.0–0.5)
Eosinophils Relative: 1 %
HCT: 41.7 % (ref 39.0–52.0)
Hemoglobin: 13.3 g/dL (ref 13.0–17.0)
Immature Granulocytes: 0 %
Lymphocytes Relative: 2 %
Lymphs Abs: 0.2 10*3/uL — ABNORMAL LOW (ref 0.7–4.0)
MCH: 28.7 pg (ref 26.0–34.0)
MCHC: 31.9 g/dL (ref 30.0–36.0)
MCV: 90.1 fL (ref 80.0–100.0)
Monocytes Absolute: 0.2 10*3/uL (ref 0.1–1.0)
Monocytes Relative: 2 %
Neutro Abs: 9.2 10*3/uL — ABNORMAL HIGH (ref 1.7–7.7)
Neutrophils Relative %: 95 %
Platelets: 186 10*3/uL (ref 150–400)
RBC: 4.63 MIL/uL (ref 4.22–5.81)
RDW: 14.7 % (ref 11.5–15.5)
WBC: 9.7 10*3/uL (ref 4.0–10.5)
nRBC: 0 % (ref 0.0–0.2)

## 2023-06-14 LAB — I-STAT CG4 LACTIC ACID, ED
Lactic Acid, Venous: 2.5 mmol/L (ref 0.5–1.9)
Lactic Acid, Venous: 1.5 mmol/L (ref 0.5–1.9)

## 2023-06-14 LAB — PROTIME-INR
INR: 1.1 (ref 0.8–1.2)
Prothrombin Time: 14.3 s (ref 11.4–15.2)

## 2023-06-14 LAB — APTT: aPTT: 30 s (ref 24–36)

## 2023-06-14 MED ORDER — LEVOTHYROXINE SODIUM 25 MCG PO TABS
125.0000 ug | ORAL_TABLET | Freq: Every day | ORAL | Status: DC
  Administered 2023-06-15 – 2023-06-18 (×4): 125 ug via ORAL
  Filled 2023-06-14 (×4): qty 1

## 2023-06-14 MED ORDER — ACETAMINOPHEN 325 MG PO TABS: 650.0000 mg | ORAL_TABLET | Freq: Four times a day (QID) | ORAL | Status: DC | PRN

## 2023-06-14 MED ORDER — DEXTROSE 5 % IV SOLN
500.0000 mg | INTRAVENOUS | Status: DC
  Administered 2023-06-15: 500 mg via INTRAVENOUS
  Filled 2023-06-14 (×3): qty 5

## 2023-06-14 MED ORDER — CEFTRIAXONE SODIUM 1 G IJ SOLR
1.0000 g | Freq: Once | INTRAMUSCULAR | Status: AC
  Administered 2023-06-14: 1 g via INTRAVENOUS
  Filled 2023-06-14: qty 10

## 2023-06-14 MED ORDER — RISAQUAD PO CAPS
1.0000 | ORAL_CAPSULE | Freq: Every day | ORAL | Status: DC
  Administered 2023-06-15 – 2023-06-17 (×3): 1 via ORAL
  Filled 2023-06-14 (×4): qty 1

## 2023-06-14 MED ORDER — TAMSULOSIN HCL 0.4 MG PO CAPS
0.4000 mg | ORAL_CAPSULE | Freq: Every day | ORAL | Status: DC
  Administered 2023-06-15 – 2023-06-17 (×3): 0.4 mg via ORAL
  Filled 2023-06-14 (×3): qty 1

## 2023-06-14 MED ORDER — SODIUM CHLORIDE 0.9 % IV BOLUS
700.0000 mL | Freq: Once | INTRAVENOUS | Status: AC
  Administered 2023-06-14: 700 mL via INTRAVENOUS

## 2023-06-14 MED ORDER — CEFTRIAXONE SODIUM 2 G IJ SOLR
2.0000 g | INTRAMUSCULAR | Status: DC
  Administered 2023-06-15: 2 g via INTRAVENOUS
  Filled 2023-06-14: qty 20

## 2023-06-14 MED ORDER — DOXYCYCLINE HYCLATE 100 MG IV SOLR
100.0000 mg | Freq: Once | INTRAVENOUS | Status: AC
  Administered 2023-06-14: 100 mg via INTRAVENOUS
  Filled 2023-06-14: qty 100

## 2023-06-14 MED ORDER — ASPIRIN 81 MG PO TBEC
81.0000 mg | DELAYED_RELEASE_TABLET | Freq: Every day | ORAL | Status: DC
Start: 2023-06-15 — End: 2023-06-18
  Administered 2023-06-15 – 2023-06-17 (×3): 81 mg via ORAL
  Filled 2023-06-14 (×3): qty 1

## 2023-06-14 MED ORDER — LACTATED RINGERS IV BOLUS (SEPSIS)
1000.0000 mL | Freq: Once | INTRAVENOUS | Status: AC
  Administered 2023-06-14: 1000 mL via INTRAVENOUS

## 2023-06-14 NOTE — Assessment & Plan Note (Signed)
continue levothyroxine

## 2023-06-14 NOTE — Assessment & Plan Note (Addendum)
-  This is chronic and followed by pulmonology.  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  He last underwent thoracentesis in December 2023 and fluid studies were unrevealing for malignancy. -Wife would like to proceed with therapeutic thoracentesis -will obtain thoracentesis in the morning

## 2023-06-14 NOTE — Assessment & Plan Note (Signed)
S/p multiple chemotherapy regimen and radiation - Currently under observation with oncology Dr. Shirline Frees - On Zometa infusion every 6 months

## 2023-06-14 NOTE — ED Provider Notes (Addendum)
[place] EMERGENCY DEPARTMENT AT [facility] Provider Note   CSN: [id number] Arrival date & time: 06/14/23  1832     History  Chief Complaint  Patient presents with   Fever   UTI    Jeremiah Owens is a 89 y.o. male with history of multiple myeloma, recurring pneumonia, presenting to ED from home with concern for cough and fever and weakness.  EMS reports that the patient's been having symptoms for several days and wife called to bring him to the hospital.  The patient was reportedly DNR/DNI, and on a floor care home hospice, but between EMS discussion with his wife, they were advised to bring the patient to the ED for treatment.  Patient also with a history of recurrent urinary infections per my review of medical notes.  Patient's wife at the bedside confirms the patient is DNR/DNI.  She denies that he is on home hospice, reports that he has "palliative care" at home.  She is wanting medical treatment, and feels that this is in keeping with his wishes.  She reports he gets recurring pneumonias likely from dysphagia and aspiration.  She also reports that he is completing his last day of a course of Macrobid for presumed UTI.  He does have a history of prostate cancer.  HPI     Home Medications Prior to Admission medications   Medication Sig Start Date End Date Taking? Authorizing Provider  aspirin 81 MG EC tablet Take 81 mg by mouth daily. 03/29/15   [provider]  Calcium Carbonate-Vitamin D (CALTRATE 600+D PO) Take 600 mg by mouth 2 (two) times daily.    [provider]  cholecalciferol (VITAMIN D) 1000 UNITS tablet Take 1,000 Units by mouth at bedtime.     [provider]  furosemide (LASIX) 20 MG tablet Take 20 mg by mouth as directed. Take 1 tablets by mouth every, Wednesday, Friday, Saturday, Sunday and 2 tablets by mouth Tuesday and Thursday 08/21/22   [provider]  hydrocortisone 2.5 % lotion Apply 1 application  topically at  bedtime as needed (apply to redness.rash daily prn). 03/24/20   [provider]  KLOR-CON 10 10 MEQ tablet Take 10 mEq by mouth daily. 03/21/15   [provider]  levothyroxine (SYNTHROID) 125 MCG tablet 1 tablet in the morning on an empty stomach 08/01/20   [provider]  Multiple Vitamins-Minerals (CENTRUM SILVER PO) Take 1 tablet by mouth daily.    [provider]  Nutritional Supplement LIQD Take 1 each by mouth daily. Med Pass 2.0    [provider]  omeprazole (PRILOSEC) 40 MG capsule Take 40 mg by mouth daily as needed (heartburn).     [provider]  saccharomyces boulardii (FLORASTOR) 250 MG capsule Take 250 mg by mouth daily.    [provider]  tamsulosin (FLOMAX) 0.4 MG CAPS capsule Take 1 capsule (0.4 mg total) by mouth at bedtime. 02/21/15   Angiulli, Mcarthur Rossetti, PA-C  TEXACORT 2.5 % SOLN Apply 1 Application topically daily as needed (for scalp). 07/02/17   [provider]      Allergies    Iodine    Review of Systems   Review of Systems  Physical Exam Updated Vital Signs BP 123/62   Pulse (!) 105   Temp (!) 101.6 F (38.7 C) (Rectal)   Resp (!) 27   Ht 5\' 9"  (1.753 m)   Wt 59 kg   SpO2 100%   BMI 19.20 kg/m  Physical Exam Constitutional:      General: He is not in acute distress.    Comments: Thin, frail, cachectic  HENT:     Head: Normocephalic and atraumatic.  Eyes:     Conjunctiva/sclera: Conjunctivae normal.     Pupils: Pupils are equal, round, and reactive to light.  Cardiovascular:     Rate and Rhythm: Regular rhythm. Tachycardia present.  Pulmonary:     Effort: Pulmonary effort is normal. No respiratory distress.     Comments: Mildly tachypneic, 99% on 2L [place], productive cough Abdominal:     General: There is no distension.     Tenderness: There is no abdominal tenderness.  Musculoskeletal:        General: Injury: .mdmlevel5.  Skin:    General: Skin is warm and dry.   Neurological:     General: No focal deficit present.     Mental Status: He is alert. Mental status is at baseline.     ED Results / Procedures / Treatments   Labs (all labs ordered are listed, but only abnormal results are displayed) Labs Reviewed  COMPREHENSIVE METABOLIC PANEL - Abnormal; Notable for the following components:      Result Value   CO2 20 (*)    Glucose, Bld 116 (*)    BUN 27 (*)    Albumin 3.4 (*)    All other components within normal limits  CBC WITH DIFFERENTIAL/PLATELET - Abnormal; Notable for the following components:   Neutro Abs 9.2 (*)    Lymphs Abs 0.2 (*)    All other components within normal limits  I-STAT CG4 LACTIC ACID, ED - Abnormal; Notable for the following components:   Lactic Acid, Venous 2.5 (*)    All other components within normal limits  CULTURE, BLOOD (ROUTINE X 2)  CULTURE, BLOOD (ROUTINE X 2)  PROTIME-INR  APTT  URINALYSIS, W/ REFLEX TO CULTURE (INFECTION SUSPECTED)    EKG EKG Interpretation Date/Time:  Sunday June 14 2023 19:01:38 EST Ventricular Rate:  109 PR Interval:  192 QRS Duration:  95 QT Interval:  322 QTC Calculation: 434 R Axis:   145  Text Interpretation: Sinus tachycardia Consider right atrial enlargement Right axis deviation Confirmed by Alvester Chou [phone]) on 06/14/2023 7:54:29 PM  Radiology DG Chest Port 1 View  Result Date: 06/14/2023 CLINICAL DATA:  Questionable sepsis - evaluate for abnormality EXAM: PORTABLE CHEST 1 VIEW COMPARISON:  Chest x-ray 01/30/2023 FINDINGS: The heart and mediastinal contours are within normal limits. Atherosclerotic plaque. No focal consolidation. No pulmonary edema. Stable to possibly slightly increased in size right at least moderate volume pleural effusion. No pneumothorax. No acute osseous abnormality. Old healed right rib fractures. Thoracic surgical hardware. IMPRESSION: Stable to possibly slightly increased in size right at least moderate volume pleural effusion.  Electronically Signed   By: Tish Frederickson M.D.   On: 06/14/2023 20:14    Procedures .Critical Care  Performed by: Terald Sleeper, MD Authorized by: Terald Sleeper, MD   Critical care provider statement:    Critical care time (minutes):  35   Critical care time was exclusive of:  Separately billable procedures and treating other patients   Critical care was necessary to treat or prevent imminent or life-threatening deterioration of the following conditions:  Sepsis   Critical care was time spent personally by me on the following activities:  Ordering and performing treatments and interventions, ordering and review of laboratory studies, ordering and review of radiographic studies, pulse oximetry, review of old charts,  examination of patient and evaluation of patient's response to treatment   Care discussed with: admitting provider       Medications Ordered in ED Medications  cefTRIAXone (ROCEPHIN) 1 g in sodium chloride 0.9 % 100 mL IVPB (1 g Intravenous New Bag/Given 06/14/23 2008)  doxycycline (VIBRAMYCIN) 100 mg in dextrose 5 % 250 mL IVPB (has no administration in time range)  sodium chloride 0.9 % bolus 700 mL (has no administration in time range)  lactated ringers bolus 1,000 mL (1,000 mLs Intravenous New Bag/Given 06/14/23 1912)    ED Course/ Medical Decision Making/ A&P Clinical Course as of 06/14/23 2023  Sun Jun 14, 2023  1941 Blood return from attempted foley catheter insert about 6 inches - no urine output, I have advised RN stop insertion efforts.  90 cc's urine only on bladder scan [MT]  2023 Admitted to hospitalist Dr. Cyndia Bent [MT]    Clinical Course User Index [MT] Renaye Rakers Kermit Balo, MD                                 Medical Decision Making Amount and/or Complexity of Data Reviewed Labs: ordered. Radiology: ordered. ECG/medicine tests: ordered.  Risk Decision regarding hospitalization.   This patient presents to the ED with concern for shortness of  breath, fevers, chills. This involves an extensive number of treatment options, and is a complaint that carries with it a high risk of complications and morbidity.  The differential diagnosis includes pneumonia versus aspiration was viral URI versus other  Co-morbidities that complicate the patient evaluation: History of dysphagia, at risk of aspiration  Additional history obtained from wife, family members at bedside  External records from outside source obtained and reviewed including history of recurring aspiration pneumonia per medical records  I ordered and personally interpreted labs.  The pertinent results include: White blood cell count within normal this.  Lactate 2.5.  CMP largely unremarkable.  Blood culture sent and pending  I ordered imaging studies including x-ray of the chest I independently visualized and interpreted imaging which showed concern for right-sided infiltrate with pleural effusion I agree with the radiologist interpretation  The patient was maintained on a cardiac monitor.  I personally viewed and interpreted the cardiac monitored which showed an underlying rhythm of: Borderline tachycardia, mild tachycardia  Per my interpretation the patient's ECG shows no acute ischemic findings  I ordered medication including Rocephin and doxycycline for suspected pneumonia.  1700 cc of IV fluids per sepsis fluid bolus.  I have reviewed the patients home medicines and have made adjustments as needed  Test Considered: Given difficulties of obtaining catheterized urine sample, I advised holding off on further catheterization.  Will attempt to cross treat for pneumonia and UTI, Rocephin should be appropriate for hospitalization stay, least for admission.    Low suspicion for intra-abdominal source of infection or sepsis, no GI symptoms according to family.  I do not see an indication for an emergent CT scan of the abdomen.   After the interventions noted above, I reevaluated  the patient and found that they have: stayed the same  Goals of care discussion with patient's wife confirms the patient is DNR/DNI, not wanting heroic measures, but family and patient are amenable to IV antibiotics, hospitalization and treatment for pneumonia.    Patient is VERY hard of hearing.  Dispostion:  After consideration of the diagnostic results and the patients response to treatment, I feel that the  patent would benefit from medical admission         Final Clinical Impression(s) / ED Diagnoses Final diagnoses:  Sepsis, due to unspecified organism, unspecified whether acute organ dysfunction present [facility])  Pneumonia due to infectious organism, unspecified laterality, unspecified part of lung    Rx / DC Orders ED Discharge Orders     None         Cephus Tupy, Kermit Balo, MD 06/14/23 2018    Terald Sleeper, MD 06/14/23 2023

## 2023-06-14 NOTE — Assessment & Plan Note (Signed)
-  nectar thick liquid as recommended by speech therapy in the past

## 2023-06-14 NOTE — Assessment & Plan Note (Signed)
-  secondary to aspiration pneumonia -continue IV Rocephin and Azithromycin  -continue gentle IV fluid hydration overnight -follow lactic trend

## 2023-06-14 NOTE — H&P (Signed)
History and Physical    Patient: Jeremiah Owens WUJ:[id number] DOB: 1930-07-14 DOA: 06/14/2023 DOS: the patient was seen and examined on 06/14/2023 PCP: Georgann Housekeeper, MD  Patient coming from: Home  Chief Complaint:  Chief Complaint  Patient presents with   Fever   UTI   HPI: Jeremiah Owens is a 89 y.o. male with medical history significant of Multiple myeloma (diagnosed in 2015) s/p several chemotherapy regimens, radiation now on observation for greater than 9 years, epidural tumor resection, history of prostate cancer, chronic right-sided pleural effusion who presented with cough.   Wife at bedside provides history as pt is very hard of hearing.  Pt started to have cough and temperature of 69F about 4 days ago. At baseline he has shallow breathing but this has also worsened in the past few days. She had palliative care arrange for a chest X-ray yesterday that has not yet resulted.  Today he developed chills and rigors and wife noted his temperature to be around 101F and called EMS. Pt also currently on his last dose of Macrobid for UTI and today complained of pain to the tip of his penis.  No nausea, vomiting or diarrhea. No other complaints of pain although wife reports that he is not a complainer.   Of note, he has had recurrent pneumonia and UTI for the past several months.  Pneumonia thought due to this dysphagia and chronic aspiration.  On arrival to ED, he was febrile to 101.6, tachycardic with heart rate of 112, tachypneic with respiratory rate of 34 and was placed on 2 L via nasal cannula.  He had no leukocytosis or anemia.  Lactate was elevated 2.5.  CMP otherwise unremarkable.  Chest x-ray revealed known chronic right sided pleural effusion.  In-N-Out cath was attempted to obtain UA but was met with resistant and was only able to get about 90 cc of urine output.  He was started on IV Rocephin and doxycycline.  Hospitalist then consulted for admission.   Patient is under  palliative care who comes out once a month. Otherwise he has daily caregivers 7 days a week and lives with his wife. He needs a hoyer lift to be able to transfer from bed to chair. He is able to walk short distances but needs walker and at least one person assistance. Needs work with most ADLS but able to feed himself.    Review of Systems: unable to review all systems due to the inability of the patient to answer questions. Past Medical History:  Diagnosis Date   Allergy    Anxiety    Bone cancer (HCC)    t_spine T-5   Compression fracture    Encounter for antineoplastic chemotherapy 01/31/2015   Hearing loss    Melanoma (HCC)    Multiple myeloma (HCC)    Prostate cancer (HCC) 2002   S/P radiation therapy 08/21/14-09/04/14   T4-6 25Gy/29fx   S/P radiation therapy 11/29/14-12/12/14   rt prox humerus/shoulder/scapula 25Gy/33fx   Skin cancer 1994   melanoma  right neck    Stroke (HCC)    Thyroid disease    Past Surgical History:  Procedure Laterality Date   HERNIA REPAIR     IR THORACENTESIS ASP PLEURAL SPACE W/IMG GUIDE  07/07/2022   POSTERIOR CERVICAL FUSION/FORAMINOTOMY N/A 06/17/2014   Procedure: Thoracic five laminectomy for epidural tumor resection Thoracic 4-7 posterior lateral arthrodesis, segmental pedicle screw fixation.;  Surgeon: Coletta Memos, MD;  Location: [facility];  Service: Neurosurgery;  Laterality: N/A;  Social History:  reports that he has never smoked. He has never used smokeless tobacco. He reports that he does not drink alcohol and does not use drugs.  Allergies  Allergen Reactions   Iodine Itching and Rash    Occurred >20 years ago to topical preporation.  Tolerated IV contrast without issue.     Family History  Problem Relation Age of Onset   Cancer Mother        breast   Stroke Mother     Prior to Admission medications   Medication Sig Start Date End Date Taking? Authorizing Provider  acetaminophen (TYLENOL) 325 MG tablet Take 650 mg by mouth  every 6 (six) hours as needed for mild pain (pain score 1-3) or fever.   Yes [provider]  nitrofurantoin, macrocrystal-monohydrate, (MACROBID) 100 MG capsule Take 100 mg by mouth every 12 (twelve) hours. 06/05/23  Yes [provider]  aspirin 81 MG EC tablet Take 81 mg by mouth daily. 03/29/15   [provider]  Calcium Carbonate-Vitamin D (CALTRATE 600+D PO) Take 600 mg by mouth 2 (two) times daily.    [provider]  cholecalciferol (VITAMIN D) 1000 UNITS tablet Take 1,000 Units by mouth at bedtime.     [provider]  furosemide (LASIX) 20 MG tablet Take 20 mg by mouth as directed. Take 1 tablets by mouth every, Wednesday, Friday, Saturday, Sunday and 2 tablets by mouth Tuesday and Thursday 08/21/22   [provider]  hydrocortisone 2.5 % lotion Apply 1 application  topically at bedtime as needed (apply to redness.rash daily prn). 03/24/20   [provider]  KLOR-CON 10 10 MEQ tablet Take 10 mEq by mouth daily. 03/21/15   [provider]  levothyroxine (SYNTHROID) 125 MCG tablet 1 tablet in the morning on an empty stomach 08/01/20   [provider]  Multiple Vitamins-Minerals (CENTRUM SILVER PO) Take 1 tablet by mouth daily.    [provider]  Nutritional Supplement LIQD Take 1 each by mouth daily. Med Pass 2.0    [provider]  omeprazole (PRILOSEC) 40 MG capsule Take 40 mg by mouth daily as needed (heartburn).     [provider]  saccharomyces boulardii (FLORASTOR) 250 MG capsule Take 250 mg by mouth daily.    [provider]  tamsulosin (FLOMAX) 0.4 MG CAPS capsule Take 1 capsule (0.4 mg total) by mouth at bedtime. 02/21/15   Angiulli, Mcarthur Rossetti, PA-C  TEXACORT 2.5 % SOLN Apply 1 Application topically daily as needed (for scalp). 07/02/17   [provider]    Physical Exam: Vitals:   06/14/23 1927 06/14/23 1945 06/14/23 2000 06/14/23 2015  BP:  125/61 115/60  123/62  Pulse:  (!) 110 (!) 107 (!) 105  Resp:  (!) 26 (!) 36 (!) 27  Temp: (!) 101.6 F (38.7 C)     TempSrc: Rectal     SpO2:  97% 99% 100%  Weight:      Height:       Constitutional: NAD, calm, comfortable, chronically ill appearing frail elderly male lying upright in bed Eyes: lids and conjunctivae normal ENMT: Mucous membranes are moist. Neck: normal, supple Respiratory: Diminished lung sound to right base with faint wheeze. Labored respiration at rest and mouth breathing. No accessory muscle use.  Cardiovascular: Regular rate and rhythm, no murmurs / rubs / gallops. No extremity edema. Abdomen: no tenderness,soft, moderately distended  GU: blood clots briefly flowed out from urethra due to recent trauma from in  and out catherization Musculoskeletal: no clubbing / cyanosis. No joint deformity upper and lower extremities. Muscle wasting on all extremities Skin: no rashes, lesions, ulcers. No induration Neurologic: CN 2-12 grossly intact.About to move LE in bed but not against resistance Psychiatric: Normal mood.   Data Reviewed:  See HPI  Assessment and Plan: * Sepsis (HCC) -secondary to aspiration pneumonia -continue IV Rocephin and Azithromycin  -continue gentle IV fluid hydration overnight -follow lactic trend  Hypothyroidism -continue levothyroxine  Pleural effusion on right -This is chronic and followed by pulmonology.  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  He last underwent thoracentesis in December 2023 and fluid studies were unrevealing for malignancy. -Wife would like to proceed with therapeutic thoracentesis -will obtain thoracentesis in the morning  Dysphagia -nectar thick liquid as recommended by speech therapy in the past  Multiple myeloma [facility]) S/p multiple chemotherapy regimen and radiation - Currently under observation with oncology Dr. Shirline Frees - On Zometa infusion every 6 months      Advance Care Planning:   Code Status:  Limited: Do not attempt resuscitation (DNR) -DNR-LIMITED -Do Not Intubate/DNI    Consults: none  Family Communication: wife and sister-in-law at bedside  Severity of Illness: The appropriate patient status for this patient is INPATIENT. Inpatient status is judged to be reasonable and necessary in order to provide the required intensity of service to ensure the patient's safety. The patient's presenting symptoms, physical exam findings, and initial radiographic and laboratory data in the context of their chronic comorbidities is felt to place them at high risk for further clinical deterioration. Furthermore, it is not anticipated that the patient will be medically stable for discharge from the hospital within 2 midnights of admission.   * I certify that at the point of admission it is my clinical judgment that the patient will require inpatient hospital care spanning beyond 2 midnights from the point of admission due to high intensity of service, high risk for further deterioration and high frequency of surveillance required.*  Author: Anselm Jungling, DO 06/14/2023 9:09 PM  For on call review [email or web address].

## 2023-06-14 NOTE — ED Triage Notes (Signed)
Pt BIB [facility] from home Low grade fever since Wednesday. Pt has a hx of pneumonia. Currently pt has a wet cough and a UTI.  Home temperature was 100.4  EMS 148/81 121 P 36 R 101.3 T 18 LAC 250 NS

## 2023-06-15 ENCOUNTER — Inpatient Hospital Stay (HOSPITAL_COMMUNITY): Payer: No Typology Code available for payment source

## 2023-06-15 DIAGNOSIS — A419 Sepsis, unspecified organism: Secondary | ICD-10-CM | POA: Diagnosis not present

## 2023-06-15 DIAGNOSIS — R6520 Severe sepsis without septic shock: Secondary | ICD-10-CM

## 2023-06-15 DIAGNOSIS — J90 Pleural effusion, not elsewhere classified: Secondary | ICD-10-CM | POA: Diagnosis not present

## 2023-06-15 DIAGNOSIS — J690 Pneumonitis due to inhalation of food and vomit: Secondary | ICD-10-CM | POA: Diagnosis not present

## 2023-06-15 DIAGNOSIS — R1310 Dysphagia, unspecified: Secondary | ICD-10-CM | POA: Diagnosis not present

## 2023-06-15 LAB — CBC
HCT: 31.5 % — ABNORMAL LOW (ref 39.0–52.0)
Hemoglobin: 10 g/dL — ABNORMAL LOW (ref 13.0–17.0)
MCH: 28.7 pg (ref 26.0–34.0)
MCHC: 31.7 g/dL (ref 30.0–36.0)
MCV: 90.3 fL (ref 80.0–100.0)
Platelets: 167 10*3/uL (ref 150–400)
RBC: 3.49 MIL/uL — ABNORMAL LOW (ref 4.22–5.81)
RDW: 14.6 % (ref 11.5–15.5)
WBC: 7.4 10*3/uL (ref 4.0–10.5)
nRBC: 0 % (ref 0.0–0.2)

## 2023-06-15 MED ORDER — LIDOCAINE HCL 1 % IJ SOLN
INTRAMUSCULAR | Status: AC
  Filled 2023-06-15: qty 20

## 2023-06-15 NOTE — ED Notes (Signed)
ED TO INPATIENT HANDOFF REPORT  ED Nurse Name and Phone #: Karthik Whittinghill  S Name/Age/Gender Jeremiah Owens 89 y.o. male Room/Bed: 038C/038C  Code Status   Code Status: Limited: Do not attempt resuscitation (DNR) -DNR-LIMITED -Do Not Intubate/DNI   Home/SNF/Other Home Patient oriented to: self and situation Is this baseline? Yes   Triage Complete: Triage complete  Chief Complaint Sepsis [facility]) [A41.9]  Triage Note Pt BIB [facility] from home Low grade fever since Wednesday. Pt has a hx of pneumonia. Currently pt has a wet cough and a UTI.  Home temperature was 100.4  EMS 148/81 121 P 36 R 101.3 T 18 LAC 250 NS   Allergies Allergies  Allergen Reactions   Iodine Itching and Rash    Occurred >20 years ago to topical preporation.  Tolerated IV contrast without issue.     Level of Care/Admitting Diagnosis ED Disposition     ED Disposition  Admit   Condition  --   Comment  Hospital Area: [facility] [[id number]]  Level of Care: Telemetry Medical [104]  May admit patient to [facility] or [facility] if equivalent level of care is available:: No  Covid Evaluation: Asymptomatic - no recent exposure (last 10 days) testing not required  Diagnosis: Sepsis [facility]) [[id number]]  Admitting Physician: Anselm Jungling [[id number]]  Attending Physician: Anselm Jungling [[id number]]  Certification:: I certify this patient will need inpatient services for at least 2 midnights  Expected Medical Readiness: 06/18/2023          B Medical/Surgery History Past Medical History:  Diagnosis Date   Allergy    Anxiety    Bone cancer (HCC)    t_spine T-5   Compression fracture    Encounter for antineoplastic chemotherapy 01/31/2015   Hearing loss    Melanoma (HCC)    Multiple myeloma (HCC)    Prostate cancer (HCC) 2002   S/P radiation therapy 08/21/14-09/04/14   T4-6 25Gy/69fx   S/P radiation therapy 11/29/14-12/12/14   rt prox humerus/shoulder/scapula 25Gy/43fx   Skin cancer 1994   melanoma   right neck    Stroke (HCC)    Thyroid disease    Past Surgical History:  Procedure Laterality Date   HERNIA REPAIR     IR THORACENTESIS ASP PLEURAL SPACE W/IMG GUIDE  07/07/2022   POSTERIOR CERVICAL FUSION/FORAMINOTOMY N/A 06/17/2014   Procedure: Thoracic five laminectomy for epidural tumor resection Thoracic 4-7 posterior lateral arthrodesis, segmental pedicle screw fixation.;  Surgeon: Coletta Memos, MD;  Location: [facility];  Service: Neurosurgery;  Laterality: N/A;     A IV Location/Drains/Wounds Patient Lines/Drains/Airways Status     Active Line/Drains/Airways     Name Placement date Placement time Site Days   Peripheral IV 06/14/23 18 G 1" Left Antecubital 06/14/23  --  Antecubital  1   Peripheral IV 06/14/23 18 G Anterior;Right Forearm 06/14/23  1850  Forearm  1            Intake/Output Last 24 hours  Intake/Output Summary (Last 24 hours) at 06/15/2023 0438 Last data filed at 06/14/2023 2123 Gross per 24 hour  Intake 798.06 ml  Output --  Net 798.06 ml    Labs/Imaging Results for orders placed or performed during the hospital encounter of 06/14/23 (from the past 48 hour(s))  Comprehensive metabolic panel     Status: Abnormal   Collection Time: 06/14/23  7:02 PM  Result Value Ref Range   Sodium 141 135 - 145 mmol/L   Potassium 4.5 3.5 - 5.1  mmol/L    Comment: HEMOLYSIS AT THIS LEVEL MAY AFFECT RESULT   Chloride 107 98 - 111 mmol/L   CO2 20 (L) 22 - 32 mmol/L   Glucose, Bld 116 (H) 70 - 99 mg/dL    Comment: Glucose reference range applies only to samples taken after fasting for at least 8 hours.   BUN 27 (H) 8 - 23 mg/dL   Creatinine, Ser 3.24 0.61 - 1.24 mg/dL   Calcium 8.9 8.9 - 40.1 mg/dL   Total Protein 7.5 6.5 - 8.1 g/dL   Albumin 3.4 (L) 3.5 - 5.0 g/dL   AST 35 15 - 41 U/L    Comment: HEMOLYSIS AT THIS LEVEL MAY AFFECT RESULT   ALT 20 0 - 44 U/L    Comment: HEMOLYSIS AT THIS LEVEL MAY AFFECT RESULT   Alkaline Phosphatase 92 38 - 126 U/L   Total  Bilirubin 0.9 <1.2 mg/dL    Comment: HEMOLYSIS AT THIS LEVEL MAY AFFECT RESULT   GFR, Estimated >60 >60 mL/min    Comment: (NOTE) Calculated using the CKD-EPI Creatinine Equation (2021)    Anion gap 14 5 - 15    Comment: Performed at [facility], [address] [place], [place] [address]  CBC with Differential     Status: Abnormal   Collection Time: 06/14/23  7:02 PM  Result Value Ref Range   WBC 9.7 4.0 - 10.5 K/uL   RBC 4.63 4.22 - 5.81 MIL/uL   Hemoglobin 13.3 13.0 - 17.0 g/dL   HCT 36.6 44.0 - 34.7 %   MCV 90.1 80.0 - 100.0 fL   MCH 28.7 26.0 - 34.0 pg   MCHC 31.9 30.0 - 36.0 g/dL   RDW 42.5 95.6 - 38.7 %   Platelets 186 150 - 400 K/uL   nRBC 0.0 0.0 - 0.2 %   Neutrophils Relative % 95 %   Neutro Abs 9.2 (H) 1.7 - 7.7 K/uL   Lymphocytes Relative 2 %   Lymphs Abs 0.2 (L) 0.7 - 4.0 K/uL   Monocytes Relative 2 %   Monocytes Absolute 0.2 0.1 - 1.0 K/uL   Eosinophils Relative 1 %   Eosinophils Absolute 0.1 0.0 - 0.5 K/uL   Basophils Relative 0 %   Basophils Absolute 0.0 0.0 - 0.1 K/uL   Immature Granulocytes 0 %   Abs Immature Granulocytes 0.03 0.00 - 0.07 K/uL    Comment: Performed at [facility], [address] [place], [place] [address]  Protime-INR     Status: None   Collection Time: 06/14/23  7:02 PM  Result Value Ref Range   Prothrombin Time 14.3 11.4 - 15.2 seconds   INR 1.1 0.8 - 1.2    Comment: (NOTE) INR goal varies based on device and disease states. Performed at [facility], [address], [place], [place] [address]   APTT     Status: None   Collection Time: 06/14/23  7:02 PM  Result Value Ref Range   aPTT 30 24 - 36 seconds    Comment: Performed at [facility], [address], [place], [place] [address]  I-Stat Lactic Acid, ED     Status: Abnormal   Collection Time: 06/14/23  7:10 PM  Result Value Ref Range   Lactic Acid, Venous 2.5 (HH) 0.5 - 1.9 mmol/L   Comment NOTIFIED PHYSICIAN   I-Stat Lactic Acid, ED     Status:  None   Collection Time: 06/14/23 10:20 PM  Result  Value Ref Range   Lactic Acid, Venous 1.5 0.5 - 1.9 mmol/L   *Note: Due to a large number of results and/or encounters for the requested time period, some results have not been displayed. A complete set of results can be found in Results Review.   DG Chest Port 1 View  Result Date: 06/14/2023 CLINICAL DATA:  Questionable sepsis - evaluate for abnormality EXAM: PORTABLE CHEST 1 VIEW COMPARISON:  Chest x-ray 01/30/2023 FINDINGS: The heart and mediastinal contours are within normal limits. Atherosclerotic plaque. No focal consolidation. No pulmonary edema. Stable to possibly slightly increased in size right at least moderate volume pleural effusion. No pneumothorax. No acute osseous abnormality. Old healed right rib fractures. Thoracic surgical hardware. IMPRESSION: Stable to possibly slightly increased in size right at least moderate volume pleural effusion. Electronically Signed   By: Tish Frederickson M.D.   On: 06/14/2023 20:14    Pending Labs Unresulted Labs (From admission, onward)     Start     Ordered   06/15/23 0500  CBC  Tomorrow morning,   R        06/14/23 2056   06/14/23 1900  Blood Culture (routine x 2)  (Undifferentiated presentation (screening labs and basic nursing orders))  BLOOD CULTURE X 2,   STAT      06/14/23 1900   06/14/23 1900  Urinalysis, w/ Reflex to Culture (Infection Suspected) -Urine, Catheterized  (Undifferentiated presentation (screening labs and basic nursing orders))  ONCE - URGENT,   URGENT       Question:  Specimen Source  Answer:  Urine, Catheterized   06/14/23 1900            Vitals/Pain Today's Vitals   06/14/23 2130 06/15/23 0000 06/15/23 0200 06/15/23 0300  BP: 112/60 125/60 119/74 (!) 126/95  Pulse: 98 88 82 73  Resp: (!) 22 (!) 21 16 15   Temp:    100.1 F (37.8 C)  TempSrc:      SpO2: 98% 100% 100% 100%  Weight:      Height:      PainSc:        Isolation Precautions No active  isolations  Medications Medications  tamsulosin (FLOMAX) capsule 0.4 mg (has no administration in time range)  acidophilus (RISAQUAD) capsule 1 capsule (has no administration in time range)  levothyroxine (SYNTHROID) tablet 125 mcg (has no administration in time range)  aspirin EC tablet 81 mg (has no administration in time range)  acetaminophen (TYLENOL) tablet 650 mg (has no administration in time range)  cefTRIAXone (ROCEPHIN) 2 g in sodium chloride 0.9 % 100 mL IVPB (has no administration in time range)  azithromycin (ZITHROMAX) 500 mg in dextrose 5 % 250 mL IVPB (has no administration in time range)  lactated ringers bolus 1,000 mL (0 mLs Intravenous Stopped 06/14/23 2053)  cefTRIAXone (ROCEPHIN) 1 g in sodium chloride 0.9 % 100 mL IVPB (0 g Intravenous Stopped 06/14/23 2040)  doxycycline (VIBRAMYCIN) 100 mg in dextrose 5 % 250 mL IVPB (100 mg Intravenous New Bag/Given 06/14/23 2027)  sodium chloride 0.9 % bolus 700 mL (0 mLs Intravenous Stopped 06/14/23 2123)    Mobility non-ambulatory     Focused Assessments Neuro Assessment Handoff:  Swallow screen pass?  no         Neuro Assessment:   Neuro Checks:      Has TPA been given? No If patient is a Neuro Trauma and patient is going to OR before floor call report to 4N Charge nurse: [phone] or [phone]  R Recommendations: See Admitting Provider Note  Report given to:   Additional Notes: n/a

## 2023-06-15 NOTE — Assessment & Plan Note (Signed)
-  See sepsis -Continue antibiotics

## 2023-06-15 NOTE — TOC CM/SW Note (Signed)
Notified [facility] of hospital admission.   Notified [facility] of admission

## 2023-06-15 NOTE — Plan of Care (Signed)
Patient with history of chronic right sided pleural effusion currently admitted for aspiration pneumonia. CXR 06/14/23 showed unchanged right pleural effusion.  Request for right thoracentesis.  Patient brought to IR for possible thoracentesis - US imaging of the right chest shows small pleural effusion which is not amenable to safe thoracentesis at this time due to size as well as patient inability to remain in appropriate position. Significant RLL atelectasis noted on [place].   No procedure performed due to scant amount of fluid and patient with difficulty remaining in position. Patient returned to the floor.   IR remains available as needed, please call with questions or concerns.  Lynnette Caffey, PA-C

## 2023-06-15 NOTE — Progress Notes (Signed)
Progress Note    Jeremiah Owens   ZOX:[id number]  DOB: [birth year]  DOA: 06/14/2023     1 PCP: Georgann Housekeeper, MD  Initial CC: fever  Hospital Course: Anatoly Dory is a 89 y.o. male with medical history significant of Multiple myeloma (diagnosed in 2015) s/p several chemotherapy regimens, radiation now on observation for greater than 9 years, epidural tumor resection, history of prostate cancer, chronic right-sided pleural effusion who presented with cough.    Wife at bedside provides history as pt is very hard of hearing.  Pt started to have cough and temperature of 92F about 4 days PTA. At baseline he has shallow breathing but this has also worsened in the past few days. He developed chills and rigors and wife noted his temperature to be around 101F and called EMS. Pt also currently on his last dose of Macrobid for UTI and complained of pain to the tip of his penis.  No nausea, vomiting or diarrhea. No other complaints of pain although wife reports that he is not a complainer.    Of note, he has had recurrent pneumonia and UTI for the past several months.  Pneumonia thought due to this dysphagia and chronic aspiration.   On arrival to ED, he was febrile to 101.6, tachycardic with heart rate of 112, tachypneic with respiratory rate of 34 and was placed on 2 L via nasal cannula.   He had no leukocytosis or anemia.  Lactate was elevated 2.5.   Chest x-ray revealed known chronic right sided pleural effusion.   He was started on IV Rocephin and doxycycline.    Patient is under palliative care who comes out once a month. Otherwise he has daily caregivers 7 days a week and lives with his wife. He needs a hoyer lift to be able to transfer from bed to chair. He is able to walk short distances but needs walker and at least one person assistance. Needs work with most ADLS but able to feed himself.     Interval History:  Still a poor historian.  Denies cough or shortness of breath this morning.   Plan is for thoracentesis today.  Assessment and Plan: * Severe sepsis (HCC)-resolved as of 06/15/2023 - Febrile, tachycardia, tachypnea, lactic acidosis.  Suspected lung source -continue IV Rocephin and Azithromycin  Pleural effusion on right -This is chronic and followed by pulmonology.  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  He last underwent thoracentesis in December 2023 and fluid studies were unrevealing for malignancy. -Wife would like to proceed with therapeutic thoracentesis - follow up fluid studies   Aspiration pneumonia (HCC) - See sepsis - Continue antibiotics  Dysphagia -nectar thick liquid as recommended by speech therapy in the past  Hypothyroidism -continue levothyroxine  Multiple myeloma (HCC) S/p multiple chemotherapy regimen and radiation - Currently under observation with oncology Dr. Shirline Frees - On Zometa infusion every 6 months   Old records reviewed in assessment of this patient  Antimicrobials: Rocephin 06/14/2023 >> current Azithromycin 06/15/2023 >> current  DVT prophylaxis:  SCDs Start: 06/14/23 2056   Code Status:   Code Status: Limited: Do not attempt resuscitation (DNR) -DNR-LIMITED -Do Not Intubate/DNI   Mobility Assessment (Last 72 Hours)     Mobility Assessment     Row Name 06/15/23 0816 06/15/23 0615         Does patient have an order for bedrest or is patient medically unstable No - Continue assessment No - Continue assessment      What is  the highest level of mobility based on the progressive mobility assessment? Level 1 (Bedfast) - Unable to balance while sitting on edge of bed Level 1 (Bedfast) - Unable to balance while sitting on edge of bed      Is the above level different from baseline mobility prior to current illness? No - Consider discontinuing PT/OT Yes - Recommend PT order               Barriers to discharge: none Disposition Plan:  Home Status is: Inpt  Objective: Blood pressure (!)  122/58, pulse 69, temperature 98 F (36.7 C), temperature source Oral, resp. rate 16, height 5\' 9"  (1.753 m), weight 59 kg, SpO2 97%.  Examination:  Physical Exam Constitutional:      Comments: Chronically ill-appearing elderly gentleman lying in bed confused but in no distress  HENT:     Head: Normocephalic and atraumatic.     Mouth/Throat:     Mouth: Mucous membranes are moist.  Eyes:     Extraocular Movements: Extraocular movements intact.  Cardiovascular:     Rate and Rhythm: Normal rate and regular rhythm.  Pulmonary:     Effort: Pulmonary effort is normal. No respiratory distress.     Comments: Coarse breath sounds bilaterally and dull on the right Abdominal:     General: Bowel sounds are normal. There is no distension.     Palpations: Abdomen is soft.     Tenderness: There is no abdominal tenderness.  Musculoskeletal:        General: No swelling. Normal range of motion.     Cervical back: Normal range of motion and neck supple.  Skin:    General: Skin is warm and dry.  Neurological:     Mental Status: He is disoriented.      Consultants:    Procedures:    Data Reviewed: Results for orders placed or performed during the hospital encounter of 06/14/23 (from the past 24 hour(s))  Blood Culture (routine x 2)     Status: None (Preliminary result)   Collection Time: 06/14/23  7:00 PM   Specimen: BLOOD  Result Value Ref Range   Specimen Description BLOOD SITE NOT SPECIFIED    Special Requests      BOTTLES DRAWN AEROBIC AND ANAEROBIC Blood Culture results may not be optimal due to an excessive volume of blood received in culture bottles   Culture      NO GROWTH < 12 HOURS Performed at [facility], [address], [place], [place] [address]    Report Status PENDING   Comprehensive metabolic panel     Status: Abnormal   Collection Time: 06/14/23  7:02 PM  Result Value Ref Range   Sodium 141 135 - 145 mmol/L   Potassium 4.5 3.5 - 5.1 mmol/L   Chloride 107 98  - 111 mmol/L   CO2 20 (L) 22 - 32 mmol/L   Glucose, Bld 116 (H) 70 - 99 mg/dL   BUN 27 (H) 8 - 23 mg/dL   Creatinine, Ser 6.04 0.61 - 1.24 mg/dL   Calcium 8.9 8.9 - 54.0 mg/dL   Total Protein 7.5 6.5 - 8.1 g/dL   Albumin 3.4 (L) 3.5 - 5.0 g/dL   AST 35 15 - 41 U/L   ALT 20 0 - 44 U/L   Alkaline Phosphatase 92 38 - 126 U/L   Total Bilirubin 0.9 <1.2 mg/dL   GFR, Estimated >98 >11 mL/min   Anion gap 14 5 - 15  CBC  with Differential     Status: Abnormal   Collection Time: 06/14/23  7:02 PM  Result Value Ref Range   WBC 9.7 4.0 - 10.5 K/uL   RBC 4.63 4.22 - 5.81 MIL/uL   Hemoglobin 13.3 13.0 - 17.0 g/dL   HCT 16.1 09.6 - 04.5 %   MCV 90.1 80.0 - 100.0 fL   MCH 28.7 26.0 - 34.0 pg   MCHC 31.9 30.0 - 36.0 g/dL   RDW 40.9 81.1 - 91.4 %   Platelets 186 150 - 400 K/uL   nRBC 0.0 0.0 - 0.2 %   Neutrophils Relative % 95 %   Neutro Abs 9.2 (H) 1.7 - 7.7 K/uL   Lymphocytes Relative 2 %   Lymphs Abs 0.2 (L) 0.7 - 4.0 K/uL   Monocytes Relative 2 %   Monocytes Absolute 0.2 0.1 - 1.0 K/uL   Eosinophils Relative 1 %   Eosinophils Absolute 0.1 0.0 - 0.5 K/uL   Basophils Relative 0 %   Basophils Absolute 0.0 0.0 - 0.1 K/uL   Immature Granulocytes 0 %   Abs Immature Granulocytes 0.03 0.00 - 0.07 K/uL  Protime-INR     Status: None   Collection Time: 06/14/23  7:02 PM  Result Value Ref Range   Prothrombin Time 14.3 11.4 - 15.2 seconds   INR 1.1 0.8 - 1.2  APTT     Status: None   Collection Time: 06/14/23  7:02 PM  Result Value Ref Range   aPTT 30 24 - 36 seconds  Blood Culture (routine x 2)     Status: None (Preliminary result)   Collection Time: 06/14/23  7:05 PM   Specimen: BLOOD  Result Value Ref Range   Specimen Description BLOOD SITE NOT SPECIFIED    Special Requests      BOTTLES DRAWN AEROBIC AND ANAEROBIC Blood Culture adequate volume   Culture      NO GROWTH < 12 HOURS Performed at [facility], [address] [place], [place] [address]    Report Status PENDING    I-Stat Lactic Acid, ED     Status: Abnormal   Collection Time: 06/14/23  7:10 PM  Result Value Ref Range   Lactic Acid, Venous 2.5 (HH) 0.5 - 1.9 mmol/L   Comment NOTIFIED PHYSICIAN   I-Stat Lactic Acid, ED     Status: None   Collection Time: 06/14/23 10:20 PM  Result Value Ref Range   Lactic Acid, Venous 1.5 0.5 - 1.9 mmol/L  CBC     Status: Abnormal   Collection Time: 06/15/23  9:28 AM  Result Value Ref Range   WBC 7.4 4.0 - 10.5 K/uL   RBC 3.49 (L) 4.22 - 5.81 MIL/uL   Hemoglobin 10.0 (L) 13.0 - 17.0 g/dL   HCT 62.1 (L) 30.8 - 65.7 %   MCV 90.3 80.0 - 100.0 fL   MCH 28.7 26.0 - 34.0 pg   MCHC 31.7 30.0 - 36.0 g/dL   RDW 84.6 96.2 - 95.2 %   Platelets 167 150 - 400 K/uL   nRBC 0.0 0.0 - 0.2 %   *Note: Due to a large number of results and/or encounters for the requested time period, some results have not been displayed. A complete set of results can be found in Results Review.    I have reviewed pertinent nursing notes, vitals, labs, and images as necessary. I have ordered labwork to follow up on as indicated.  I have reviewed the last notes from staff over  past 24 hours. I have discussed patient's care plan and test results with nursing staff, CM/SW, and other staff as appropriate.  Time spent: Greater than 50% of the 55 minute visit was spent in counseling/coordination of care for the patient as laid out in the A&P.   LOS: 1 day   Lewie Chamber, MD [facility] 06/15/2023, 11:01 AM

## 2023-06-15 NOTE — Hospital Course (Signed)
Jeremiah Owens is a 89 y.o. male with medical history significant of Multiple myeloma (diagnosed in 2015) s/p several chemotherapy regimens, radiation now on observation for greater than 9 years, epidural tumor resection, history of prostate cancer, chronic right-sided pleural effusion who presented with cough.    Wife at bedside provides history as pt is very hard of hearing.  Pt started to have cough and temperature of 25F about 4 days PTA. At baseline he has shallow breathing but this has also worsened in the past few days. He developed chills and rigors and wife noted his temperature to be around 101F and called EMS. Pt also currently on his last dose of Macrobid for UTI and complained of pain to the tip of his penis.  No nausea, vomiting or diarrhea. No other complaints of pain although wife reports that he is not a complainer.    Of note, he has had recurrent pneumonia and UTI for the past several months.  Pneumonia thought due to this dysphagia and chronic aspiration.   On arrival to ED, he was febrile to 101.6, tachycardic with heart rate of 112, tachypneic with respiratory rate of 34 and was placed on 2 L via nasal cannula.   He had no leukocytosis or anemia.  Lactate was elevated 2.5.   Chest x-ray revealed known chronic right sided pleural effusion.   He was started on IV Rocephin and doxycycline.    Patient is under palliative care who comes out once a month. Otherwise he has daily caregivers 7 days a week and lives with his wife. He needs a hoyer lift to be able to transfer from bed to chair. He is able to walk short distances but needs walker and at least one person assistance. Needs work with most ADLS but able to feed himself.

## 2023-06-15 NOTE — Progress Notes (Signed)
[facility] [phone] [place] [facility] Liaison note:  This patient is currently enrolled in [facility] outpatient-based palliative care. Hospital Liaison will continue to follow for discharge disposition. Please call for any outpatient based palliative care related questions or concerns. Thank you, Thea Gist, BSN RN Hospice hospital liaison [phone]

## 2023-06-16 DIAGNOSIS — J690 Pneumonitis due to inhalation of food and vomit: Secondary | ICD-10-CM | POA: Diagnosis not present

## 2023-06-16 DIAGNOSIS — J90 Pleural effusion, not elsewhere classified: Secondary | ICD-10-CM | POA: Diagnosis not present

## 2023-06-16 DIAGNOSIS — R1310 Dysphagia, unspecified: Secondary | ICD-10-CM | POA: Diagnosis not present

## 2023-06-16 DIAGNOSIS — A419 Sepsis, unspecified organism: Secondary | ICD-10-CM | POA: Diagnosis not present

## 2023-06-16 MED ORDER — AMOXICILLIN-POT CLAVULANATE 400-57 MG/5ML PO SUSR
875.0000 mg | Freq: Two times a day (BID) | ORAL | Status: DC
  Administered 2023-06-16 – 2023-06-17 (×4): 875 mg via ORAL
  Filled 2023-06-16 (×6): qty 10.9

## 2023-06-16 NOTE — Plan of Care (Signed)

## 2023-06-16 NOTE — Plan of Care (Signed)
  Problem: Respiratory: Goal: Ability to maintain a clear airway will improve Outcome: Progressing   Problem: Pain Management: Goal: General experience of comfort will improve Outcome: Progressing   Problem: Safety: Goal: Ability to remain free from injury will improve Outcome: Progressing

## 2023-06-16 NOTE — Progress Notes (Signed)
Progress Note    Berl Bauman   ZOX:[id number]  DOB: October 13, 1929  DOA: 06/14/2023     2 PCP: Georgann Housekeeper, MD  Initial CC: fever  Hospital Course: Naseem Stephan is a 89 y.o. male with medical history significant of Multiple myeloma (diagnosed in 2015) s/p several chemotherapy regimens, radiation now on observation for greater than 9 years, epidural tumor resection, history of prostate cancer, chronic right-sided pleural effusion who presented with cough.    Wife at bedside provides history as pt is very hard of hearing.  Pt started to have cough and temperature of 42F about 4 days PTA. At baseline he has shallow breathing but this has also worsened in the past few days. He developed chills and rigors and wife noted his temperature to be around 101F and called EMS. Pt also currently on his last dose of Macrobid for UTI and complained of pain to the tip of his penis.  No nausea, vomiting or diarrhea. No other complaints of pain although wife reports that he is not a complainer.    Of note, he has had recurrent pneumonia and UTI for the past several months.  Pneumonia thought due to this dysphagia and chronic aspiration.   On arrival to ED, he was febrile to 101.6, tachycardic with heart rate of 112, tachypneic with respiratory rate of 34 and was placed on 2 L via nasal cannula.   He had no leukocytosis or anemia.  Lactate was elevated 2.5.   Chest x-ray revealed known chronic right sided pleural effusion.   He was started on IV Rocephin and doxycycline.    Patient is under palliative care who comes out once a month. Otherwise he has daily caregivers 7 days a week and lives with his wife. He needs a hoyer lift to be able to transfer from bed to chair. He is able to walk short distances but needs walker and at least one person assistance. Needs work with most ADLS but able to feed himself.  Interval History:  Being fed breakfast this morning by nurse aide.  His sister-in-law was present  bedside and update given.  Also attempted to call wife but no success.  They plan to arrange for caretakers to resume care tomorrow and explained he should be stable for discharging tomorrow.  Assessment and Plan: * Severe sepsis (HCC)-resolved as of 06/15/2023 - Febrile, tachycardia, tachypnea, lactic acidosis.  Suspected lung source -s/p Rocephin and Azithromycin; transition to Augmentin and will complete course; high chance of future aspiration  Pleural effusion on right -This is chronic and followed by pulmonology.  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  He last underwent thoracentesis in December 2023 and fluid studies were unrevealing for malignancy. -Wife would like to proceed with therapeutic thoracentesis -Thoracentesis attempted by radiology on 06/15/2023 and unsuccessful due to insufficient fluid  Aspiration pneumonia (HCC) - Transition antibiotics to Augmentin and complete course - With chronic right pleural effusion and overall failure to thrive with palliative care already following at home and ongoing recurrent aspiration events, he has guarded prognosis -Strongly recommend he continue with palliative care at home with consideration of transitioning to hospice  Dysphagia - No longer tolerating regular diet with nectar thick liquids; changing diet to dysphagia 3 nectar thick  Hypothyroidism -continue levothyroxine  Multiple myeloma (HCC) S/p multiple chemotherapy regimen and radiation - Currently under observation with oncology Dr. Shirline Frees - On Zometa infusion every 6 months   Old records reviewed in assessment of this patient  Antimicrobials: Rocephin  06/14/2023 >> 11/12 Azithromycin 06/15/2023 >> 11/12 Augmentin 11/12 >> current   DVT prophylaxis:  SCDs Start: 06/14/23 2056   Code Status:   Code Status: Limited: Do not attempt resuscitation (DNR) -DNR-LIMITED -Do Not Intubate/DNI   Mobility Assessment (Last 72 Hours)     Mobility  Assessment     Row Name 06/15/23 2145 06/15/23 0816 06/15/23 0615       Does patient have an order for bedrest or is patient medically unstable No - Continue assessment No - Continue assessment No - Continue assessment     What is the highest level of mobility based on the progressive mobility assessment? Level 1 (Bedfast) - Unable to balance while sitting on edge of bed Level 1 (Bedfast) - Unable to balance while sitting on edge of bed Level 1 (Bedfast) - Unable to balance while sitting on edge of bed     Is the above level different from baseline mobility prior to current illness? Yes - Recommend PT order No - Consider discontinuing PT/OT Yes - Recommend PT order              Barriers to discharge: none Disposition Plan:  Home Status is: Inpt  Objective: Blood pressure (!) 117/54, pulse 78, temperature 98.1 F (36.7 C), resp. rate 17, height 5\' 9"  (1.753 m), weight 59 kg, SpO2 95%.  Examination:  Physical Exam Constitutional:      Comments: Chronically ill-appearing elderly gentleman lying in bed confused but in no distress  HENT:     Head: Normocephalic and atraumatic.     Mouth/Throat:     Mouth: Mucous membranes are moist.  Eyes:     Extraocular Movements: Extraocular movements intact.  Cardiovascular:     Rate and Rhythm: Normal rate and regular rhythm.  Pulmonary:     Effort: Pulmonary effort is normal. No respiratory distress.     Comments: Coarse breath sounds bilaterally and dull on the right Abdominal:     General: Bowel sounds are normal. There is no distension.     Palpations: Abdomen is soft.     Tenderness: There is no abdominal tenderness.  Musculoskeletal:        General: No swelling. Normal range of motion.     Cervical back: Normal range of motion and neck supple.  Skin:    General: Skin is warm and dry.  Neurological:     Mental Status: He is disoriented.      Consultants:    Procedures:    Data Reviewed: No results found. However, due to  the size of the patient record, not all encounters were searched. Please check Results Review for a complete set of results.   I have reviewed pertinent nursing notes, vitals, labs, and images as necessary. I have ordered labwork to follow up on as indicated.  I have reviewed the last notes from staff over past 24 hours. I have discussed patient's care plan and test results with nursing staff, CM/SW, and other staff as appropriate.  Time spent: Greater than 50% of the 55 minute visit was spent in counseling/coordination of care for the patient as laid out in the A&P.   LOS: 2 days   Lewie Chamber, MD [facility] 06/16/2023, 4:29 PM

## 2023-06-16 NOTE — Plan of Care (Signed)
  Problem: Clinical Measurements: Goal: Will remain free from infection Outcome: Progressing   Problem: Clinical Measurements: Goal: Respiratory complications will improve Outcome: Progressing   Problem: Pain Management: Goal: General experience of comfort will improve Outcome: Progressing   Problem: Safety: Goal: Ability to remain free from injury will improve Outcome: Progressing

## 2023-06-16 NOTE — TOC CM/SW Note (Signed)
Per chart review. Patient from home with wife and daily caregivers.  Patient has a hoyer at home.   Confirmed yesterday with Misty with [facility] he is active with them for palliative care.   Received a call this morning from Wing with [facility] , patient is active with them for [facility] and HHPT. [facility] will need orders and face to face to continue services .   NCM spoke to patient at bedside. Received consent to call his wife Corrie Dandy [phone]. NCM called same no answer, left voicemail with NCM direct call back number. Await call back.

## 2023-06-16 NOTE — TOC Initial Note (Signed)
Transition of Care (TOC) - Initial/Assessment Note   Spoke to patient at bedside , deferred all questions to wife Lennyn Vasbinder . Called Sherilyn Dacosta. She returned call.   At discharge patient will need ambulance transport to address on face sheet.   Patient has hoyer lift, hospital bed, wheel chair and walker at home.   He has caregivers 7 days a week.   Monday to Friday the caregivers come 0830  to 1330  and 1530 to 1930   Saturday and Sunday caregivers come 0900 to 1330 and then 1630 to 1930.   Patient has [facility] for RN,PT,OT , Mrs Gokey would like to continue services. Will need updated orders .   Mrs Copping would also like to continue palliative care services through [facility]. [facility] was notified of admission yesterday.   [facility] PCP Dr Terrilee Files , [place] [phone]  Patient Details  Name: Jeremiah Owens MRN: [id number] Date of Birth: 1930/05/25  Transition of Care [facility]) CM/SW Contact:    Kingsley Plan, RN Phone Number: 06/16/2023, 12:20 PM  Clinical Narrative:                   Expected Discharge Plan: Home w Home Health Services Barriers to Discharge: Continued Medical Work up   Patient Goals and CMS Choice Patient states their goals for this hospitalization and ongoing recovery are:: deferred to wife , to return to home CMS Medicare.gov Compare Post Acute Care list provided to:: Patient Represenative (must comment) (wife) Choice offered to / list presented to : Spouse      Expected Discharge Plan and Services   Discharge Planning Services: CM Consult Post Acute Care Choice: Home Health Living arrangements for the past 2 months: Single Family Home                 DME Arranged: N/A         HH Arranged: RN, PT, OT HH Agency: [facility] ([facility]) Date HH Agency Contacted: 06/16/23 Time HH Agency Contacted: 1219 Representative spoke with at [facility] Agency: Morrie Sheldon, Asked for orders and face to face  Prior Living  Arrangements/Services Living arrangements for the past 2 months: Single Family Home Lives with:: Spouse Patient language and need for interpreter reviewed:: Yes Do you feel safe going back to the place where you live?: Yes      Need for Family Participation in Patient Care: Yes (Comment) Care giver support system in place?: Yes (comment) Current home services: DME Criminal Activity/Legal Involvement Pertinent to Current Situation/Hospitalization: No - Comment as needed  Activities of Daily Living      Permission Sought/Granted   Permission granted to share information with : Yes, Verbal Permission Granted  Share Information with NAME: wife Cloud Oberbroeckling [phone]  Permission granted to share info w AGENCY: [facility], [facility]        Emotional Assessment Appearance:: Appears stated age            Admission diagnosis:  Sepsis (HCC) [A41.9] Pneumonia due to infectious organism, unspecified laterality, unspecified part of lung [J18.9] Sepsis, due to unspecified organism, unspecified whether acute organ dysfunction present [facility]) [A41.9] Patient Active Problem List   Diagnosis Date Noted   Hypothyroidism 06/14/2023   Palliative care encounter 07/25/2022   Pleural effusion on right 07/07/2022   UTI (urinary tract infection) 07/04/2022   Stage 3a chronic kidney disease (CKD) (HCC) 07/04/2022   Disorder of left rotator cuff 08/08/2020   Multiple myeloma not  having achieved remission (HCC) 10/24/2015   Dysphagia 07/10/2015   Diarrhea 04/25/2015   Cancer associated pain 04/25/2015   Long term current use of anticoagulant therapy 04/25/2015   Basal ganglia infarction (HCC) 02/12/2015   Dizziness    Expressive aphasia    Past pointing    Right leg weakness    History of stroke 02/11/2015   Cerebral infarction (HCC) 02/11/2015   Aspiration pneumonia (HCC) 02/11/2015   Aphasia    Difficulty speaking    Speech difficult to understand    Encounter for antineoplastic  chemotherapy 01/31/2015   Constipation 12/20/2014   Rash 12/20/2014   Peripheral edema 12/06/2014   Multifactorial gait disorder 09/05/2014   Multiple myeloma (HCC)    Paraplegia (HCC) 06/21/2014   Postoperative anemia due to acute blood loss 06/21/2014   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 [place] [address] Phone: [phone] Fax: [phone]     Social Determinants of Health (SDOH) Social History: SDOH Screenings   Food Insecurity: No Food Insecurity (07/07/2022)  Depression (PHQ2-9): Low Risk  (05/13/2023)  Social Connections: Unknown (12/17/2021)   Received from [facility]  Tobacco Use: Low Risk  (06/14/2023)   SDOH Interventions:     Readmission Risk Interventions     No data to display

## 2023-06-17 DIAGNOSIS — A419 Sepsis, unspecified organism: Secondary | ICD-10-CM | POA: Diagnosis not present

## 2023-06-17 DIAGNOSIS — R6520 Severe sepsis without septic shock: Secondary | ICD-10-CM | POA: Diagnosis not present

## 2023-06-17 NOTE — Evaluation (Signed)
Clinical/Bedside Swallow Evaluation Patient Details  Name: Jeremiah Owens MRN: [id number] Date of Birth: [birth year]  Today's Date: 06/17/2023 Time: SLP Start Time (ACUTE ONLY): 4132 SLP Stop Time (ACUTE ONLY): 0950 SLP Time Calculation (min) (ACUTE ONLY): 12 min  Past Medical History:  Past Medical History:  Diagnosis Date   Allergy    Anxiety    Bone cancer (HCC)    t_spine T-5   Compression fracture    Encounter for antineoplastic chemotherapy 01/31/2015   Hearing loss    Melanoma (HCC)    Multiple myeloma (HCC)    Prostate cancer (HCC) 2002   S/P radiation therapy 08/21/14-09/04/14   T4-6 25Gy/16fx   S/P radiation therapy 11/29/14-12/12/14   rt prox humerus/shoulder/scapula 25Gy/44fx   Skin cancer 1994   melanoma  right neck    Stroke (HCC)    Thyroid disease    Past Surgical History:  Past Surgical History:  Procedure Laterality Date   HERNIA REPAIR     IR THORACENTESIS ASP PLEURAL SPACE W/IMG GUIDE  07/07/2022   POSTERIOR CERVICAL FUSION/FORAMINOTOMY N/A 06/17/2014   Procedure: Thoracic five laminectomy for epidural tumor resection Thoracic 4-7 posterior lateral arthrodesis, segmental pedicle screw fixation.;  Surgeon: Coletta Memos, MD;  Location: [facility];  Service: Neurosurgery;  Laterality: N/A;   HPI:  89 y.o. male known to SLP services admitted 11/11 with dx of sepsis, pleural effusion (chronic, followed by pulmonology), aspiration pna.  PMHx includes multiple myeloma (diagnosed in 2015) s/p several chemotherapy regimens, radiation now on observation for greater than 9 years, epidural tumor resection, history of prostate cancer, dysphagia. He has had recurrent pneumonia and UTI for the past several months.  Pneumonia thought due to dysphagia and chronic aspiration. Followed by Palliative Care at home, with overall FTT.  He has been seen by SLP during admissions as far back as July 2016. He was last seen on 07/07/22 with noted concerns for ongoing dysphagia and  recommendations for nectar-thick liquids.  Palliative was following at that time.    Assessment / Plan / Recommendation  Clinical Impression  Pt presents with deteriorated swallowing function since last seen by SLP last December.  His voice is wet at baseline. He has hypotussia with decreased strength/crispness of cough.  He presents with s/s of persistent dysphagia regardless of consistency provided: multiple sub-swallows per bolus, coughing intermittently with thin, nectar, and honey thick consistencies.  Given hx, review of chart, it is not unexpected that he continues to aspirate.  He stated this am that he was ready to go home. When I approached him about his swallowing he expressed no concerns that his swallowing is problematic in any way, other than he coughs occasionally with eating.  Recommend that we reconsult with Palliative care while admitted to discuss potential next steps as dysphagia progresses. Continue current, baseline diet pending updated GOC discussion. SLP will follow for plan, education as needed. SLP Visit Diagnosis: Dysphagia, oropharyngeal phase (R13.12)    Aspiration Risk    high   Diet Recommendation   Dysphagia 3 (mechanical soft);Nectar  Medication Administration: Crushed with puree    Other  Recommendations Oral Care Recommendations: Oral care BID    Recommendations for follow up therapy are one component of a multi-disciplinary discharge planning process, led by the attending physician.  Recommendations may be updated based on patient status, additional functional criteria and insurance authorization.  Follow up Recommendations Other (comment) (tba)      Assistance Recommended at Discharge    Functional Status Assessment Patient has  had a recent decline in their functional status and demonstrates the ability to make significant improvements in function in a reasonable and predictable amount of time.  Frequency and Duration min 2x/week  1 week        Prognosis Prognosis for improved oropharyngeal function: Guarded Barriers to Reach Goals: Severity of deficits      Swallow Study   General Date of Onset: 06/15/23 HPI: 89 y.o. male known to SLP services admitted 11/11 with dx of sepsis, pleural effusion (chronic, followed by pulmonology), aspiration pna.  PMHx includes multiple myeloma (diagnosed in 2015) s/p several chemotherapy regimens, radiation now on observation for greater than 9 years, epidural tumor resection, history of prostate cancer, dysphagia. He has had recurrent pneumonia and UTI for the past several months.  Pneumonia thought due to dysphagia and chronic aspiration. Followed by Palliative Care at home, with overall FTT.  He has been seen by SLP during admissions as far back as July 2016. He was last seen on 07/07/22 with noted concerns for ongoing dysphagia and recommendations for nectar-thick liquids.  Palliative was following at that time. Type of Study: Bedside Swallow Evaluation Previous Swallow Assessment: see HPI Diet Prior to this Study: Dysphagia 3 (mechanical soft);Mildly thick liquids (Level 2, nectar thick) Temperature Spikes Noted: No Respiratory Status: Room air History of Recent Intubation: No Behavior/Cognition: Alert;Cooperative Oral Cavity Assessment: Within Functional Limits Oral Care Completed by SLP: No Oral Cavity - Dentition: Adequate natural dentition Vision: Functional for self-feeding Self-Feeding Abilities: Able to feed self Patient Positioning: Upright in bed Baseline Vocal Quality: Wet Volitional Cough: Weak;Congested Volitional Swallow: Able to elicit    Oral/Motor/Sensory Function Overall Oral Motor/Sensory Function: Within functional limits   Ice Chips Ice chips: Not tested   Thin Liquid Thin Liquid: Impaired Presentation: Cup Pharyngeal  Phase Impairments: Multiple swallows;Cough - Immediate    Nectar Thick Nectar Thick Liquid: Impaired Presentation: Cup Pharyngeal Phase  Impairments: Cough - Immediate   Honey Thick Honey Thick Liquid: Impaired Presentation: Cup Pharyngeal Phase Impairments: Cough - Immediate   Puree Puree: Within functional limits   Solid     Solid: Not tested      Blenda Mounts Laurice 06/17/2023,10:02 AM  Marchelle Folks L. Samson Frederic, MA CCC/SLP Clinical Specialist - Acute Care SLP Acute Rehabilitation Services Office number [phone]

## 2023-06-17 NOTE — Progress Notes (Addendum)
PROGRESS NOTE    Jeremiah Owens  ZDG:[id number] DOB: 11/06/1929 DOA: 06/14/2023 PCP: Georgann Housekeeper, MD   Brief Narrative:  89 y.o. male with medical history significant of Multiple myeloma (diagnosed in 2015) s/p several chemotherapy regimens, radiation now on observation for greater than 9 years, epidural tumor resection, history of prostate cancer, chronic right-sided pleural effusion, recurrent pneumonia and UTI presented with cough and fever.  Chest x-ray revealed chronic right-sided pleural effusion.  He was started on IV antibiotics.  Assessment & Plan:   Severe sepsis: Present on admission; resolved; possibly from pneumonia -Antibiotic plan as below  Possible aspiration pneumonia Chronic right pleural effusion -Antibiotics have already been transitioned to Augmentin.  Thoracentesis attempted by radiology on 06/15/2023: Unsuccessful due to insufficient fluid -Outpatient follow-up with pulmonary -SLP evaluation  Dysphagia -SLP evaluation  Goals of care -Overall prognosis is guarded to poor.  Follows up with [facility] as an outpatient. -DNR  Hypothyroidism -Continue levothyroxine  BPH -continue tamsulosin  Multiple myeloma -S/p multiple chemotherapy regimen and radiation - Currently under observation with oncology Dr. Shirline Frees - On Zometa infusion every 6 months    DVT prophylaxis: SCDs Code Status: DNR  Family Communication: none at bedside Disposition Plan: Status is: Inpatient Remains inpatient appropriate because: Of severity of illness  Consultants: None  Procedures: None  Antimicrobials:  Anti-infectives (From admission, onward)    Start     Dose/Rate Route Frequency Ordered Stop   06/16/23 1045  amoxicillin-clavulanate (AUGMENTIN) 400-57 MG/5ML suspension 875 mg        875 mg Oral Every 12 hours 06/16/23 0946     06/15/23 1000  cefTRIAXone (ROCEPHIN) 2 g in sodium chloride 0.9 % 100 mL IVPB  Status:  Discontinued        2 g 200 mL/hr over 30  Minutes Intravenous Every 24 hours 06/14/23 2056 06/16/23 0946   06/15/23 1000  azithromycin (ZITHROMAX) 500 mg in dextrose 5 % 250 mL IVPB  Status:  Discontinued        500 mg 250 mL/hr over 60 Minutes Intravenous Every 24 hours 06/14/23 2056 06/16/23 0946   06/14/23 2000  cefTRIAXone (ROCEPHIN) 1 g in sodium chloride 0.9 % 100 mL IVPB        1 g 200 mL/hr over 30 Minutes Intravenous  Once 06/14/23 1955 06/14/23 2040   06/14/23 2000  doxycycline (VIBRAMYCIN) 100 mg in dextrose 5 % 250 mL IVPB        100 mg 125 mL/hr over 120 Minutes Intravenous  Once 06/14/23 1955 06/14/23 2227        Subjective: Patient seen and examined at bedside.  Poor historian.  Wakes up slightly, confused.  No fever, seizures, vomiting or agitation reported.  Objective: Vitals:   06/16/23 1601 06/16/23 2043 06/17/23 0519 06/17/23 0840  BP: (!) 117/54 (!) 142/74 (!) 148/66 (!) 118/50  Pulse: 78 87 70 84  Resp: 17 18 18 17   Temp: 98.1 F (36.7 C) 98.1 F (36.7 C) (!) 97.4 F (36.3 C) (!) 97.2 F (36.2 C)  TempSrc:  Oral Oral   SpO2: 95% 94% 97% 98%  Weight:      Height:        Intake/Output Summary (Last 24 hours) at 06/17/2023 1039 Last data filed at 06/16/2023 1700 Gross per 24 hour  Intake 240 ml  Output 400 ml  Net -160 ml   Filed Weights   06/14/23 1838  Weight: 59 kg    Examination:  General exam: Appears calm and comfortable.  On room air.  Looks chronically ill and deconditioned.  Wakes up slightly, confused.  Poor historian.  Slow to respond. Respiratory system: Bilateral decreased breath sounds at bases with scattered crackles Cardiovascular system: S1 & S2 heard, Rate controlled Gastrointestinal system: Abdomen is nondistended, soft and nontender. Normal bowel sounds heard. Extremities: No cyanosis, clubbing, edema   Data Reviewed: I have personally reviewed following labs and imaging studies  CBC: Recent Labs  Lab 06/14/23 1902 06/15/23 0928  WBC 9.7 7.4  NEUTROABS 9.2*   --   HGB 13.3 10.0*  HCT 41.7 31.5*  MCV 90.1 90.3  PLT 186 167   Basic Metabolic Panel: Recent Labs  Lab 06/14/23 1902  NA 141  K 4.5  CL 107  CO2 20*  GLUCOSE 116*  BUN 27*  CREATININE 1.02  CALCIUM 8.9   GFR: Estimated Creatinine Clearance: 39.4 mL/min (by C-G formula based on SCr of 1.02 mg/dL). Liver Function Tests: Recent Labs  Lab 06/14/23 1902  AST 35  ALT 20  ALKPHOS 92  BILITOT 0.9  PROT 7.5  ALBUMIN 3.4*   No results for input(s): "LIPASE", "AMYLASE" in the last 168 hours. No results for input(s): "AMMONIA" in the last 168 hours. Coagulation Profile: Recent Labs  Lab 06/14/23 1902  INR 1.1   Cardiac Enzymes: No results for input(s): "CKTOTAL", "CKMB", "CKMBINDEX", "TROPONINI" in the last 168 hours. BNP (last 3 results) No results for input(s): "PROBNP" in the last 8760 hours. HbA1C: No results for input(s): "HGBA1C" in the last 72 hours. CBG: No results for input(s): "GLUCAP" in the last 168 hours. Lipid Profile: No results for input(s): "CHOL", "HDL", "LDLCALC", "TRIG", "CHOLHDL", "LDLDIRECT" in the last 72 hours. Thyroid Function Tests: No results for input(s): "TSH", "T4TOTAL", "FREET4", "T3FREE", "THYROIDAB" in the last 72 hours. Anemia Panel: No results for input(s): "VITAMINB12", "FOLATE", "FERRITIN", "TIBC", "IRON", "RETICCTPCT" in the last 72 hours. Sepsis Labs: Recent Labs  Lab 06/14/23 1910 06/14/23 2220  LATICACIDVEN 2.5* 1.5    Recent Results (from the past 240 hour(s))  Blood Culture (routine x 2)     Status: None (Preliminary result)   Collection Time: 06/14/23  7:00 PM   Specimen: BLOOD  Result Value Ref Range Status   Specimen Description BLOOD SITE NOT SPECIFIED  Final   Special Requests   Final    BOTTLES DRAWN AEROBIC AND ANAEROBIC Blood Culture results may not be optimal due to an excessive volume of blood received in culture bottles   Culture   Final    NO GROWTH 3 DAYS Performed at [facility], [address], [place], [place] [address]    Report Status PENDING  Incomplete  Blood Culture (routine x 2)     Status: None (Preliminary result)   Collection Time: 06/14/23  7:05 PM   Specimen: BLOOD  Result Value Ref Range Status   Specimen Description BLOOD SITE NOT SPECIFIED  Final   Special Requests   Final    BOTTLES DRAWN AEROBIC AND ANAEROBIC Blood Culture adequate volume   Culture   Final    NO GROWTH 3 DAYS Performed at [facility], [address] [place], [place] [address]    Report Status PENDING  Incomplete         Radiology Studies: IR US CHEST  Result Date: 06/16/2023 CLINICAL DATA:  89 year old male presenting for thoracentesis EXAM: CHEST ULTRASOUND COMPARISON:  Chest radiograph from 06/14/2023 FINDINGS: Trace right pleural effusion.  No safe window for thoracentesis. IMPRESSION: Trace  right pleural effusion.  No thoracentesis was performed. Marliss Coots, MD [facility] Electronically Signed   By: Marliss Coots M.D.   On: 06/16/2023 07:39        Scheduled Meds:  acidophilus  1 capsule Oral Daily   amoxicillin-clavulanate  875 mg Oral Q12H   aspirin EC  81 mg Oral Daily   levothyroxine  125 mcg Oral Q0600   tamsulosin  0.4 mg Oral QHS   Continuous Infusions:        Glade Lloyd, MD [facility] 06/17/2023, 10:39 AM

## 2023-06-18 DIAGNOSIS — A419 Sepsis, unspecified organism: Secondary | ICD-10-CM | POA: Diagnosis not present

## 2023-06-18 DIAGNOSIS — R6520 Severe sepsis without septic shock: Secondary | ICD-10-CM | POA: Diagnosis not present

## 2023-06-18 MED ORDER — AMOXICILLIN-POT CLAVULANATE 400-57 MG/5ML PO SUSR: 875.0000 mg | Freq: Two times a day (BID) | ORAL | 0 refills | Status: AC

## 2023-06-18 NOTE — Progress Notes (Signed)
[id number] Liaison Note  Received request from [facility] to transition this patient from [facility] care to [facility] services at home after discharge. Spoke with wife Corrie Dandy to explain hospice philosophy, services, and team approach to care. Wife verbalized understanding of information given. Per discussion, she would like for our admissions nurse to assess for any home DME needs.   Please send signed and completed DNR home with patient/family. Please provide prescriptions at discharge as needed to ensure ongoing symptom management.   Please call with any concerns. Thank you for the opportunity to participate in this patient's care.   Henderson Newcomer, LPN [facility] Liaison [phone]

## 2023-06-18 NOTE — Plan of Care (Signed)
  Problem: Clinical Measurements: Goal: Ability to maintain a body temperature in the normal range will improve Outcome: Progressing   Problem: Respiratory: Goal: Ability to maintain adequate ventilation will improve Outcome: Progressing   Problem: Clinical Measurements: Goal: Ability to maintain clinical measurements within normal limits will improve Outcome: Progressing   Problem: Coping: Goal: Level of anxiety will decrease Outcome: Progressing   Problem: Pain Management: Goal: General experience of comfort will improve Outcome: Progressing   Problem: Activity: Goal: Ability to tolerate increased activity will improve Outcome: Not Progressing

## 2023-06-18 NOTE — TOC Progression Note (Signed)
Transition of Care (TOC) - Progression Note   Received secure chat from MD and [facility] . Plan is for patient to discharge today with [facility] for hospice.   Spoke to patient and his sister in law at bedside.   Called patient's wife Corrie Dandy confirmed plan for hospice. Explained patient cannot have hospice and [facility] at same time . Will update Ashley with [facility]. Wife voiced understanding and confirmed address.   [facility] called. Estimated time of arrival is "a few minutes". Nurse, patient's sister in law and wife aware.   DNR and [facility] paperwork on chart.   Morrie Sheldon with [facility] updated . Melissa with [facility] updated and has accepted patient for hospice at home Patient Details  Name: Jeremiah Owens MRN: [id number] Date of Birth: 11/02/1929  Transition of Care [facility]) CM/SW Contact  Alishah Schulte, Adria Devon, RN Phone Number: 06/18/2023, 9:59 AM  Clinical Narrative:       Expected Discharge Plan: Home w Home Health Services Barriers to Discharge: Continued Medical Work up  Expected Discharge Plan and Services   Discharge Planning Services: CM Consult Post Acute Care Choice: Home Health Living arrangements for the past 2 months: Single Family Home Expected Discharge Date: 06/18/23               DME Arranged: N/A         HH Arranged: RN, PT, OT HH Agency: [facility] Date HH Agency Contacted: 06/16/23 Time HH Agency Contacted: 1219 Representative spoke with at [facility] Agency: Morrie Sheldon, Asked for orders and face to face   Social Determinants of Health (SDOH) Interventions SDOH Screenings   Food Insecurity: No Food Insecurity (07/07/2022)  Depression (PHQ2-9): Low Risk  (05/13/2023)  Social Connections: Unknown (12/17/2021)   Received from [facility]  Tobacco Use: Low Risk  (06/14/2023)    Readmission Risk Interventions     No data to display

## 2023-06-18 NOTE — Discharge Summary (Signed)
Physician Discharge Summary  Kyron Demint VWU:[id number] DOB: 04-26-1930 DOA: 06/14/2023  PCP: Georgann Housekeeper, MD  Admit date: 06/14/2023 Discharge date: 06/18/2023  Admitted From: Home Disposition: Home with hospice  Recommendations for Outpatient Follow-up:  Follow up with PCP in 1 week Follow-up with hospice at earliest convenience Follow up in ED if symptoms worsen or new appear   Home Health: No Equipment/Devices: None  Discharge Condition: Guarded to poor CODE STATUS: DNR Diet recommendation: Heart healthy/diet as per SLP recommendations  Brief/Interim Summary: 89 y.o. male with medical history significant of Multiple myeloma (diagnosed in 2015) s/p several chemotherapy regimens, radiation now on observation for greater than 9 years, epidural tumor resection, history of prostate cancer, chronic right-sided pleural effusion, recurrent pneumonia and UTI presented with cough and fever.  Chest x-ray revealed chronic right-sided pleural effusion.  He was started on IV antibiotics.  During hospitalization, his condition has remained stable.  He has already been switched to oral Augmentin.  He is currently on room air.  Wife is agreeable for discharge home with hospice.  Overall prognosis is poor.  He will be discharged home today with home hospice.  Discharge Diagnoses:   Severe sepsis: Present on admission; resolved; possibly from pneumonia -Antibiotic plan as below   Possible aspiration pneumonia Chronic right pleural effusion -Antibiotics have already been transitioned to Augmentin.  Thoracentesis attempted by radiology on 06/15/2023: Unsuccessful due to insufficient fluid -Outpatient follow-up with pulmonary -Diet as per SLP recommendations -Currently on room air.  Discharge home today on oral Augmentin for 3 more days  Dysphagia -Dysphagia worsening as per SLP.  Diet as per SLP recommendations  Goals of care -Overall prognosis is guarded to poor.  Follows up with  [facility] as an outpatient. -DNR -Wife is agreeable for discharge home with hospice.  Overall prognosis is poor.  He will be discharged home today with home hospice.   Hypothyroidism -Continue levothyroxine   BPH -continue tamsulosin   Multiple myeloma -S/p multiple chemotherapy regimen and radiation - Currently under observation with oncology Dr. Shirline Frees - On Zometa infusion every 6 months   Discharge Instructions  Discharge Instructions     Diet - low sodium heart healthy   Complete by: As directed    As per SLP recommendations   Increase activity slowly   Complete by: As directed       Allergies as of 06/18/2023       Reactions   Iodine Itching, Rash   Occurred >20 years ago to topical preporation.  Tolerated IV contrast without issue.         Medication List     STOP taking these medications    nitrofurantoin (macrocrystal-monohydrate) 100 MG capsule Commonly known as: MACROBID   Nutritional Supplement Liqd       TAKE these medications    acetaminophen 325 MG tablet Commonly known as: TYLENOL Take 650 mg by mouth every 6 (six) hours as needed for mild pain (pain score 1-3) or fever.   amoxicillin-clavulanate 400-57 MG/5ML suspension Commonly known as: AUGMENTIN Take 10.9 mLs (875 mg total) by mouth every 12 (twelve) hours for 3 days.   aspirin EC 81 MG tablet Take 81 mg by mouth daily.   CALTRATE 600+D PO Take 600 mg by mouth 2 (two) times daily.   CENTRUM SILVER PO Take 1 tablet by mouth daily.   cholecalciferol 1000 units tablet Commonly known as: VITAMIN D Take 1,000 Units by mouth at bedtime.   furosemide 20 MG tablet Commonly known as: LASIX  Take 20 mg by mouth as directed. Take 1 tablets by mouth everyTuesday, Wednesday, Friday, Saturday, Sunday and 2 tablets by mouth Monday and Thursday   Klor-Con 10 10 MEQ tablet Generic drug: potassium chloride Take 10 mEq by mouth at bedtime.   levothyroxine 125 MCG tablet Commonly  known as: SYNTHROID 1 tablet in the morning on an empty stomach   omeprazole 40 MG capsule Commonly known as: PRILOSEC Take 40 mg by mouth daily as needed (heartburn).   saccharomyces boulardii 250 MG capsule Commonly known as: FLORASTOR Take 250 mg by mouth daily.   tamsulosin 0.4 MG Caps capsule Commonly known as: FLOMAX Take 1 capsule (0.4 mg total) by mouth at bedtime.   Texacort 2.5 % Soln Generic drug: HYDROCORTISONE (TOPICAL) Apply 1 Application topically daily as needed (for scalp).   hydrocortisone 2.5 % lotion Apply 1 application  topically at bedtime as needed (apply to redness.rash daily prn).        Follow-up Information     [facility] Follow up.   Contact information: [address] [place] [address] [phone]        [place] Follow up.   Contact information: [address] [place] [address] [phone]                Allergies  Allergen Reactions   Iodine Itching and Rash    Occurred >20 years ago to topical preporation.  Tolerated IV contrast without issue.     Consultations: [facility]   Procedures/Studies: IR US CHEST  Result Date: 06/16/2023 CLINICAL DATA:  89 year old male presenting for thoracentesis EXAM: CHEST ULTRASOUND COMPARISON:  Chest radiograph from 06/14/2023 FINDINGS: Trace right pleural effusion.  No safe window for thoracentesis. IMPRESSION: Trace right pleural effusion.  No thoracentesis was performed. Marliss Coots, MD [facility] Radiology Electronically Signed   By: Marliss Coots M.D.   On: 06/16/2023 07:39   DG Chest Port 1 View  Result Date: 06/14/2023 CLINICAL DATA:  Questionable sepsis - evaluate for abnormality EXAM: PORTABLE CHEST 1 VIEW COMPARISON:  Chest x-ray 01/30/2023 FINDINGS: The heart and mediastinal contours are within normal limits. Atherosclerotic plaque. No focal  consolidation. No pulmonary edema. Stable to possibly slightly increased in size right at least moderate volume pleural effusion. No pneumothorax. No acute osseous abnormality. Old healed right rib fractures. Thoracic surgical hardware. IMPRESSION: Stable to possibly slightly increased in size right at least moderate volume pleural effusion. Electronically Signed   By: Tish Frederickson M.D.   On: 06/14/2023 20:14      Subjective: Patient seen and examined at bedside.  Poor historian.  No seizures, fever, vomiting reported.  Discharge Exam: Vitals:   06/17/23 2059 06/18/23 0437  BP: 128/80 (!) 139/46  Pulse: 92 79  Resp: 18 18  Temp: 98.4 F (36.9 C) 97.8 F (36.6 C)  SpO2: 97% 96%    General: Pt is elderly male lying in bed.  On room air.  Chronically ill and deconditioned looking.  Wakes up slightly, remains confused.  Extremely slow to respond and a poor historian.   Cardiovascular: rate controlled, S1/S2 + Respiratory: bilateral decreased breath sounds at bases with some scattered crackles Abdominal: Soft, NT, ND, bowel sounds + Extremities: no edema, no cyanosis    The results of significant diagnostics from this hospitalization (including imaging, microbiology, ancillary and laboratory) are listed below for reference.     Microbiology: Recent Results (from the past 240 hour(s))  Blood  Culture (routine x 2)     Status: None (Preliminary result)   Collection Time: 06/14/23  7:00 PM   Specimen: BLOOD  Result Value Ref Range Status   Specimen Description BLOOD SITE NOT SPECIFIED  Final   Special Requests   Final    BOTTLES DRAWN AEROBIC AND ANAEROBIC Blood Culture results may not be optimal due to an excessive volume of blood received in culture bottles   Culture   Final    NO GROWTH 4 DAYS Performed at [facility] Lab, [address], [place], [place] [address]    Report Status PENDING  Incomplete  Blood Culture (routine x 2)     Status: None (Preliminary result)    Collection Time: 06/14/23  7:05 PM   Specimen: BLOOD  Result Value Ref Range Status   Specimen Description BLOOD SITE NOT SPECIFIED  Final   Special Requests   Final    BOTTLES DRAWN AEROBIC AND ANAEROBIC Blood Culture adequate volume   Culture   Final    NO GROWTH 4 DAYS Performed at [facility] Lab, [address], [place], [place] [address]    Report Status PENDING  Incomplete     Labs: BNP (last 3 results) No results for input(s): "BNP" in the last 8760 hours. Basic Metabolic Panel: Recent Labs  Lab 06/14/23 1902  NA 141  K 4.5  CL 107  CO2 20*  GLUCOSE 116*  BUN 27*  CREATININE 1.02  CALCIUM 8.9   Liver Function Tests: Recent Labs  Lab 06/14/23 1902  AST 35  ALT 20  ALKPHOS 92  BILITOT 0.9  PROT 7.5  ALBUMIN 3.4*   No results for input(s): "LIPASE", "AMYLASE" in the last 168 hours. No results for input(s): "AMMONIA" in the last 168 hours. CBC: Recent Labs  Lab 06/14/23 1902 06/15/23 0928  WBC 9.7 7.4  NEUTROABS 9.2*  --   HGB 13.3 10.0*  HCT 41.7 31.5*  MCV 90.1 90.3  PLT 186 167   Cardiac Enzymes: No results for input(s): "CKTOTAL", "CKMB", "CKMBINDEX", "TROPONINI" in the last 168 hours. BNP: Invalid input(s): "POCBNP" CBG: No results for input(s): "GLUCAP" in the last 168 hours. D-Dimer No results for input(s): "DDIMER" in the last 72 hours. Hgb A1c No results for input(s): "HGBA1C" in the last 72 hours. Lipid Profile No results for input(s): "CHOL", "HDL", "LDLCALC", "TRIG", "CHOLHDL", "LDLDIRECT" in the last 72 hours. Thyroid function studies No results for input(s): "TSH", "T4TOTAL", "T3FREE", "THYROIDAB" in the last 72 hours.  Invalid input(s): "FREET3" Anemia work up No results for input(s): "VITAMINB12", "FOLATE", "FERRITIN", "TIBC", "IRON", "RETICCTPCT" in the last 72 hours. Urinalysis    Component Value Date/Time   COLORURINE AMBER (A) 07/04/2022 1553   APPEARANCEUR CLOUDY (A) 07/04/2022 1553   LABSPEC 1.021 07/04/2022  1553   PHURINE 8.0 07/04/2022 1553   GLUCOSEU NEGATIVE 07/04/2022 1553   HGBUR NEGATIVE 07/04/2022 1553   BILIRUBINUR NEGATIVE 07/04/2022 1553   KETONESUR NEGATIVE 07/04/2022 1553   PROTEINUR 100 (A) 07/04/2022 1553   UROBILINOGEN 0.2 02/10/2015 2355   NITRITE NEGATIVE 07/04/2022 1553   LEUKOCYTESUR MODERATE (A) 07/04/2022 1553   Sepsis Labs Recent Labs  Lab 06/14/23 1902 06/15/23 0928  WBC 9.7 7.4   Microbiology Recent Results (from the past 240 hour(s))  Blood Culture (routine x 2)     Status: None (Preliminary result)   Collection Time: 06/14/23  7:00 PM   Specimen: BLOOD  Result Value Ref Range Status   Specimen Description BLOOD SITE  NOT SPECIFIED  Final   Special Requests   Final    BOTTLES DRAWN AEROBIC AND ANAEROBIC Blood Culture results may not be optimal due to an excessive volume of blood received in culture bottles   Culture   Final    NO GROWTH 4 DAYS Performed at [facility] Lab, [address] [place], [place] [address]    Report Status PENDING  Incomplete  Blood Culture (routine x 2)     Status: None (Preliminary result)   Collection Time: 06/14/23  7:05 PM   Specimen: BLOOD  Result Value Ref Range Status   Specimen Description BLOOD SITE NOT SPECIFIED  Final   Special Requests   Final    BOTTLES DRAWN AEROBIC AND ANAEROBIC Blood Culture adequate volume   Culture   Final    NO GROWTH 4 DAYS Performed at [facility], [address], [place], [place] [address]    Report Status PENDING  Incomplete     Time coordinating discharge: 35 minutes  SIGNED:   Glade Lloyd, MD  [facility] 06/18/2023, 9:11 AM

## 2023-06-18 NOTE — Progress Notes (Signed)
SLP Note  Patient Details Name: Jeremiah Owens MRN: [id number] DOB: May 06, 1930          Pt is D/Cing home today with [facility] f/u on OP basis.  No further acute care SLP is warranted.    Adilynne Fitzwater L. Samson Frederic, MA CCC/SLP Clinical Specialist - Acute Care SLP Acute Rehabilitation Services Office number [phone]'  Blenda Mounts Laurice 06/18/2023, 8:28 AM

## 2023-06-18 NOTE — Plan of Care (Signed)
Instructions via AVS given to Evlyn Courier via telephone call. PTAR given AVS forms for family at home

## 2023-06-19 LAB — CULTURE, BLOOD (ROUTINE X 2)
Culture: NO GROWTH
Culture: NO GROWTH
Special Requests: ADEQUATE

## 2023-06-22 DIAGNOSIS — L89892 Pressure ulcer of other site, stage 2: Secondary | ICD-10-CM | POA: Diagnosis not present

## 2023-06-22 DIAGNOSIS — C9000 Multiple myeloma not having achieved remission: Secondary | ICD-10-CM | POA: Diagnosis not present

## 2023-06-22 DIAGNOSIS — F419 Anxiety disorder, unspecified: Secondary | ICD-10-CM | POA: Diagnosis not present

## 2023-06-22 DIAGNOSIS — A419 Sepsis, unspecified organism: Secondary | ICD-10-CM | POA: Diagnosis not present

## 2023-06-22 DIAGNOSIS — R1312 Dysphagia, oropharyngeal phase: Secondary | ICD-10-CM | POA: Diagnosis not present

## 2023-06-22 DIAGNOSIS — C7951 Secondary malignant neoplasm of bone: Secondary | ICD-10-CM | POA: Diagnosis not present

## 2023-06-22 DIAGNOSIS — E039 Hypothyroidism, unspecified: Secondary | ICD-10-CM | POA: Diagnosis not present

## 2023-06-22 DIAGNOSIS — Z7982 Long term (current) use of aspirin: Secondary | ICD-10-CM | POA: Diagnosis not present

## 2023-06-22 DIAGNOSIS — I69391 Dysphagia following cerebral infarction: Secondary | ICD-10-CM | POA: Diagnosis not present

## 2023-06-22 DIAGNOSIS — J690 Pneumonitis due to inhalation of food and vomit: Secondary | ICD-10-CM | POA: Diagnosis not present

## 2023-06-22 DIAGNOSIS — I69351 Hemiplegia and hemiparesis following cerebral infarction affecting right dominant side: Secondary | ICD-10-CM | POA: Diagnosis not present

## 2023-06-22 DIAGNOSIS — N3020 Other chronic cystitis without hematuria: Secondary | ICD-10-CM | POA: Diagnosis not present

## 2023-06-22 DIAGNOSIS — E46 Unspecified protein-calorie malnutrition: Secondary | ICD-10-CM | POA: Diagnosis not present

## 2023-06-22 DIAGNOSIS — J9601 Acute respiratory failure with hypoxia: Secondary | ICD-10-CM | POA: Diagnosis not present

## 2023-06-22 DIAGNOSIS — R627 Adult failure to thrive: Secondary | ICD-10-CM | POA: Diagnosis not present

## 2023-06-22 DIAGNOSIS — Z8546 Personal history of malignant neoplasm of prostate: Secondary | ICD-10-CM | POA: Diagnosis not present

## 2023-06-24 DIAGNOSIS — I69351 Hemiplegia and hemiparesis following cerebral infarction affecting right dominant side: Secondary | ICD-10-CM | POA: Diagnosis not present

## 2023-06-24 DIAGNOSIS — J690 Pneumonitis due to inhalation of food and vomit: Secondary | ICD-10-CM | POA: Diagnosis not present

## 2023-06-24 DIAGNOSIS — R1312 Dysphagia, oropharyngeal phase: Secondary | ICD-10-CM | POA: Diagnosis not present

## 2023-06-24 DIAGNOSIS — I69391 Dysphagia following cerebral infarction: Secondary | ICD-10-CM | POA: Diagnosis not present

## 2023-06-24 DIAGNOSIS — A419 Sepsis, unspecified organism: Secondary | ICD-10-CM | POA: Diagnosis not present

## 2023-06-24 DIAGNOSIS — J9601 Acute respiratory failure with hypoxia: Secondary | ICD-10-CM | POA: Diagnosis not present

## 2023-06-30 DIAGNOSIS — A419 Sepsis, unspecified organism: Secondary | ICD-10-CM | POA: Diagnosis not present

## 2023-06-30 DIAGNOSIS — I69351 Hemiplegia and hemiparesis following cerebral infarction affecting right dominant side: Secondary | ICD-10-CM | POA: Diagnosis not present

## 2023-06-30 DIAGNOSIS — I69391 Dysphagia following cerebral infarction: Secondary | ICD-10-CM | POA: Diagnosis not present

## 2023-06-30 DIAGNOSIS — J690 Pneumonitis due to inhalation of food and vomit: Secondary | ICD-10-CM | POA: Diagnosis not present

## 2023-06-30 DIAGNOSIS — J9601 Acute respiratory failure with hypoxia: Secondary | ICD-10-CM | POA: Diagnosis not present

## 2023-06-30 DIAGNOSIS — R1312 Dysphagia, oropharyngeal phase: Secondary | ICD-10-CM | POA: Diagnosis not present

## 2023-07-01 DIAGNOSIS — R1312 Dysphagia, oropharyngeal phase: Secondary | ICD-10-CM | POA: Diagnosis not present

## 2023-07-01 DIAGNOSIS — I69351 Hemiplegia and hemiparesis following cerebral infarction affecting right dominant side: Secondary | ICD-10-CM | POA: Diagnosis not present

## 2023-07-01 DIAGNOSIS — J9601 Acute respiratory failure with hypoxia: Secondary | ICD-10-CM | POA: Diagnosis not present

## 2023-07-01 DIAGNOSIS — I69391 Dysphagia following cerebral infarction: Secondary | ICD-10-CM | POA: Diagnosis not present

## 2023-07-01 DIAGNOSIS — A419 Sepsis, unspecified organism: Secondary | ICD-10-CM | POA: Diagnosis not present

## 2023-07-01 DIAGNOSIS — J690 Pneumonitis due to inhalation of food and vomit: Secondary | ICD-10-CM | POA: Diagnosis not present

## 2023-07-07 DIAGNOSIS — I69351 Hemiplegia and hemiparesis following cerebral infarction affecting right dominant side: Secondary | ICD-10-CM | POA: Diagnosis not present

## 2023-07-07 DIAGNOSIS — R1312 Dysphagia, oropharyngeal phase: Secondary | ICD-10-CM | POA: Diagnosis not present

## 2023-07-07 DIAGNOSIS — J690 Pneumonitis due to inhalation of food and vomit: Secondary | ICD-10-CM | POA: Diagnosis not present

## 2023-07-07 DIAGNOSIS — J9601 Acute respiratory failure with hypoxia: Secondary | ICD-10-CM | POA: Diagnosis not present

## 2023-07-07 DIAGNOSIS — A419 Sepsis, unspecified organism: Secondary | ICD-10-CM | POA: Diagnosis not present

## 2023-07-07 DIAGNOSIS — I69391 Dysphagia following cerebral infarction: Secondary | ICD-10-CM | POA: Diagnosis not present

## 2023-07-09 DIAGNOSIS — A419 Sepsis, unspecified organism: Secondary | ICD-10-CM | POA: Diagnosis not present

## 2023-07-09 DIAGNOSIS — J690 Pneumonitis due to inhalation of food and vomit: Secondary | ICD-10-CM | POA: Diagnosis not present

## 2023-07-09 DIAGNOSIS — R1312 Dysphagia, oropharyngeal phase: Secondary | ICD-10-CM | POA: Diagnosis not present

## 2023-07-09 DIAGNOSIS — I69391 Dysphagia following cerebral infarction: Secondary | ICD-10-CM | POA: Diagnosis not present

## 2023-07-09 DIAGNOSIS — I69351 Hemiplegia and hemiparesis following cerebral infarction affecting right dominant side: Secondary | ICD-10-CM | POA: Diagnosis not present

## 2023-07-09 DIAGNOSIS — J9601 Acute respiratory failure with hypoxia: Secondary | ICD-10-CM | POA: Diagnosis not present

## 2023-07-10 DIAGNOSIS — J9601 Acute respiratory failure with hypoxia: Secondary | ICD-10-CM | POA: Diagnosis not present

## 2023-07-10 DIAGNOSIS — A419 Sepsis, unspecified organism: Secondary | ICD-10-CM | POA: Diagnosis not present

## 2023-07-10 DIAGNOSIS — I69391 Dysphagia following cerebral infarction: Secondary | ICD-10-CM | POA: Diagnosis not present

## 2023-07-10 DIAGNOSIS — I69351 Hemiplegia and hemiparesis following cerebral infarction affecting right dominant side: Secondary | ICD-10-CM | POA: Diagnosis not present

## 2023-07-10 DIAGNOSIS — J690 Pneumonitis due to inhalation of food and vomit: Secondary | ICD-10-CM | POA: Diagnosis not present

## 2023-07-10 DIAGNOSIS — R1312 Dysphagia, oropharyngeal phase: Secondary | ICD-10-CM | POA: Diagnosis not present

## 2023-07-13 DIAGNOSIS — A419 Sepsis, unspecified organism: Secondary | ICD-10-CM | POA: Diagnosis not present

## 2023-07-13 DIAGNOSIS — I69351 Hemiplegia and hemiparesis following cerebral infarction affecting right dominant side: Secondary | ICD-10-CM | POA: Diagnosis not present

## 2023-07-13 DIAGNOSIS — J9601 Acute respiratory failure with hypoxia: Secondary | ICD-10-CM | POA: Diagnosis not present

## 2023-07-13 DIAGNOSIS — R1312 Dysphagia, oropharyngeal phase: Secondary | ICD-10-CM | POA: Diagnosis not present

## 2023-07-13 DIAGNOSIS — I69391 Dysphagia following cerebral infarction: Secondary | ICD-10-CM | POA: Diagnosis not present

## 2023-07-13 DIAGNOSIS — J690 Pneumonitis due to inhalation of food and vomit: Secondary | ICD-10-CM | POA: Diagnosis not present

## 2023-07-14 DIAGNOSIS — J690 Pneumonitis due to inhalation of food and vomit: Secondary | ICD-10-CM | POA: Diagnosis not present

## 2023-07-14 DIAGNOSIS — I69391 Dysphagia following cerebral infarction: Secondary | ICD-10-CM | POA: Diagnosis not present

## 2023-07-14 DIAGNOSIS — N3020 Other chronic cystitis without hematuria: Secondary | ICD-10-CM | POA: Diagnosis not present

## 2023-07-14 DIAGNOSIS — A419 Sepsis, unspecified organism: Secondary | ICD-10-CM | POA: Diagnosis not present

## 2023-07-14 DIAGNOSIS — J9601 Acute respiratory failure with hypoxia: Secondary | ICD-10-CM | POA: Diagnosis not present

## 2023-07-14 DIAGNOSIS — E039 Hypothyroidism, unspecified: Secondary | ICD-10-CM | POA: Diagnosis not present

## 2023-07-14 DIAGNOSIS — R6520 Severe sepsis without septic shock: Secondary | ICD-10-CM | POA: Diagnosis not present

## 2023-07-14 DIAGNOSIS — Z23 Encounter for immunization: Secondary | ICD-10-CM | POA: Diagnosis not present

## 2023-07-14 DIAGNOSIS — I69351 Hemiplegia and hemiparesis following cerebral infarction affecting right dominant side: Secondary | ICD-10-CM | POA: Diagnosis not present

## 2023-07-14 DIAGNOSIS — R1312 Dysphagia, oropharyngeal phase: Secondary | ICD-10-CM | POA: Diagnosis not present

## 2023-07-14 DIAGNOSIS — R627 Adult failure to thrive: Secondary | ICD-10-CM | POA: Diagnosis not present

## 2023-07-16 DIAGNOSIS — R1312 Dysphagia, oropharyngeal phase: Secondary | ICD-10-CM | POA: Diagnosis not present

## 2023-07-16 DIAGNOSIS — A419 Sepsis, unspecified organism: Secondary | ICD-10-CM | POA: Diagnosis not present

## 2023-07-16 DIAGNOSIS — I69391 Dysphagia following cerebral infarction: Secondary | ICD-10-CM | POA: Diagnosis not present

## 2023-07-16 DIAGNOSIS — J9601 Acute respiratory failure with hypoxia: Secondary | ICD-10-CM | POA: Diagnosis not present

## 2023-07-16 DIAGNOSIS — I69351 Hemiplegia and hemiparesis following cerebral infarction affecting right dominant side: Secondary | ICD-10-CM | POA: Diagnosis not present

## 2023-07-16 DIAGNOSIS — J690 Pneumonitis due to inhalation of food and vomit: Secondary | ICD-10-CM | POA: Diagnosis not present

## 2023-07-20 DIAGNOSIS — J9601 Acute respiratory failure with hypoxia: Secondary | ICD-10-CM | POA: Diagnosis not present

## 2023-07-20 DIAGNOSIS — R1312 Dysphagia, oropharyngeal phase: Secondary | ICD-10-CM | POA: Diagnosis not present

## 2023-07-20 DIAGNOSIS — A419 Sepsis, unspecified organism: Secondary | ICD-10-CM | POA: Diagnosis not present

## 2023-07-20 DIAGNOSIS — I69391 Dysphagia following cerebral infarction: Secondary | ICD-10-CM | POA: Diagnosis not present

## 2023-07-20 DIAGNOSIS — I69351 Hemiplegia and hemiparesis following cerebral infarction affecting right dominant side: Secondary | ICD-10-CM | POA: Diagnosis not present

## 2023-07-20 DIAGNOSIS — J690 Pneumonitis due to inhalation of food and vomit: Secondary | ICD-10-CM | POA: Diagnosis not present

## 2023-07-21 DIAGNOSIS — R1312 Dysphagia, oropharyngeal phase: Secondary | ICD-10-CM | POA: Diagnosis not present

## 2023-07-21 DIAGNOSIS — J9601 Acute respiratory failure with hypoxia: Secondary | ICD-10-CM | POA: Diagnosis not present

## 2023-07-21 DIAGNOSIS — I69391 Dysphagia following cerebral infarction: Secondary | ICD-10-CM | POA: Diagnosis not present

## 2023-07-21 DIAGNOSIS — J690 Pneumonitis due to inhalation of food and vomit: Secondary | ICD-10-CM | POA: Diagnosis not present

## 2023-07-21 DIAGNOSIS — A419 Sepsis, unspecified organism: Secondary | ICD-10-CM | POA: Diagnosis not present

## 2023-07-21 DIAGNOSIS — I69351 Hemiplegia and hemiparesis following cerebral infarction affecting right dominant side: Secondary | ICD-10-CM | POA: Diagnosis not present

## 2023-07-22 DIAGNOSIS — E46 Unspecified protein-calorie malnutrition: Secondary | ICD-10-CM | POA: Diagnosis not present

## 2023-07-22 DIAGNOSIS — A419 Sepsis, unspecified organism: Secondary | ICD-10-CM | POA: Diagnosis not present

## 2023-07-22 DIAGNOSIS — L89892 Pressure ulcer of other site, stage 2: Secondary | ICD-10-CM | POA: Diagnosis not present

## 2023-07-22 DIAGNOSIS — R627 Adult failure to thrive: Secondary | ICD-10-CM | POA: Diagnosis not present

## 2023-07-22 DIAGNOSIS — Z8546 Personal history of malignant neoplasm of prostate: Secondary | ICD-10-CM | POA: Diagnosis not present

## 2023-07-22 DIAGNOSIS — F419 Anxiety disorder, unspecified: Secondary | ICD-10-CM | POA: Diagnosis not present

## 2023-07-22 DIAGNOSIS — C9000 Multiple myeloma not having achieved remission: Secondary | ICD-10-CM | POA: Diagnosis not present

## 2023-07-22 DIAGNOSIS — E039 Hypothyroidism, unspecified: Secondary | ICD-10-CM | POA: Diagnosis not present

## 2023-07-22 DIAGNOSIS — I69391 Dysphagia following cerebral infarction: Secondary | ICD-10-CM | POA: Diagnosis not present

## 2023-07-22 DIAGNOSIS — C7951 Secondary malignant neoplasm of bone: Secondary | ICD-10-CM | POA: Diagnosis not present

## 2023-07-22 DIAGNOSIS — I69351 Hemiplegia and hemiparesis following cerebral infarction affecting right dominant side: Secondary | ICD-10-CM | POA: Diagnosis not present

## 2023-07-22 DIAGNOSIS — N3020 Other chronic cystitis without hematuria: Secondary | ICD-10-CM | POA: Diagnosis not present

## 2023-07-22 DIAGNOSIS — J9601 Acute respiratory failure with hypoxia: Secondary | ICD-10-CM | POA: Diagnosis not present

## 2023-07-22 DIAGNOSIS — Z7982 Long term (current) use of aspirin: Secondary | ICD-10-CM | POA: Diagnosis not present

## 2023-07-22 DIAGNOSIS — R1312 Dysphagia, oropharyngeal phase: Secondary | ICD-10-CM | POA: Diagnosis not present

## 2023-07-22 DIAGNOSIS — J690 Pneumonitis due to inhalation of food and vomit: Secondary | ICD-10-CM | POA: Diagnosis not present

## 2023-07-23 DIAGNOSIS — I69351 Hemiplegia and hemiparesis following cerebral infarction affecting right dominant side: Secondary | ICD-10-CM | POA: Diagnosis not present

## 2023-07-23 DIAGNOSIS — A419 Sepsis, unspecified organism: Secondary | ICD-10-CM | POA: Diagnosis not present

## 2023-07-23 DIAGNOSIS — J690 Pneumonitis due to inhalation of food and vomit: Secondary | ICD-10-CM | POA: Diagnosis not present

## 2023-07-23 DIAGNOSIS — R1312 Dysphagia, oropharyngeal phase: Secondary | ICD-10-CM | POA: Diagnosis not present

## 2023-07-23 DIAGNOSIS — J9601 Acute respiratory failure with hypoxia: Secondary | ICD-10-CM | POA: Diagnosis not present

## 2023-07-23 DIAGNOSIS — I69391 Dysphagia following cerebral infarction: Secondary | ICD-10-CM | POA: Diagnosis not present

## 2023-07-28 DIAGNOSIS — J9601 Acute respiratory failure with hypoxia: Secondary | ICD-10-CM | POA: Diagnosis not present

## 2023-07-28 DIAGNOSIS — R1312 Dysphagia, oropharyngeal phase: Secondary | ICD-10-CM | POA: Diagnosis not present

## 2023-07-28 DIAGNOSIS — I69351 Hemiplegia and hemiparesis following cerebral infarction affecting right dominant side: Secondary | ICD-10-CM | POA: Diagnosis not present

## 2023-07-28 DIAGNOSIS — A419 Sepsis, unspecified organism: Secondary | ICD-10-CM | POA: Diagnosis not present

## 2023-07-28 DIAGNOSIS — I69391 Dysphagia following cerebral infarction: Secondary | ICD-10-CM | POA: Diagnosis not present

## 2023-07-28 DIAGNOSIS — J690 Pneumonitis due to inhalation of food and vomit: Secondary | ICD-10-CM | POA: Diagnosis not present

## 2023-07-30 DIAGNOSIS — I69351 Hemiplegia and hemiparesis following cerebral infarction affecting right dominant side: Secondary | ICD-10-CM | POA: Diagnosis not present

## 2023-07-30 DIAGNOSIS — R1312 Dysphagia, oropharyngeal phase: Secondary | ICD-10-CM | POA: Diagnosis not present

## 2023-07-30 DIAGNOSIS — J690 Pneumonitis due to inhalation of food and vomit: Secondary | ICD-10-CM | POA: Diagnosis not present

## 2023-07-30 DIAGNOSIS — A419 Sepsis, unspecified organism: Secondary | ICD-10-CM | POA: Diagnosis not present

## 2023-07-30 DIAGNOSIS — I69391 Dysphagia following cerebral infarction: Secondary | ICD-10-CM | POA: Diagnosis not present

## 2023-07-30 DIAGNOSIS — J9601 Acute respiratory failure with hypoxia: Secondary | ICD-10-CM | POA: Diagnosis not present

## 2023-08-01 DIAGNOSIS — R1312 Dysphagia, oropharyngeal phase: Secondary | ICD-10-CM | POA: Diagnosis not present

## 2023-08-01 DIAGNOSIS — I69391 Dysphagia following cerebral infarction: Secondary | ICD-10-CM | POA: Diagnosis not present

## 2023-08-01 DIAGNOSIS — J9601 Acute respiratory failure with hypoxia: Secondary | ICD-10-CM | POA: Diagnosis not present

## 2023-08-01 DIAGNOSIS — A419 Sepsis, unspecified organism: Secondary | ICD-10-CM | POA: Diagnosis not present

## 2023-08-01 DIAGNOSIS — J690 Pneumonitis due to inhalation of food and vomit: Secondary | ICD-10-CM | POA: Diagnosis not present

## 2023-08-01 DIAGNOSIS — I69351 Hemiplegia and hemiparesis following cerebral infarction affecting right dominant side: Secondary | ICD-10-CM | POA: Diagnosis not present

## 2023-08-06 DIAGNOSIS — A419 Sepsis, unspecified organism: Secondary | ICD-10-CM | POA: Diagnosis not present

## 2023-08-06 DIAGNOSIS — R1312 Dysphagia, oropharyngeal phase: Secondary | ICD-10-CM | POA: Diagnosis not present

## 2023-08-06 DIAGNOSIS — I69351 Hemiplegia and hemiparesis following cerebral infarction affecting right dominant side: Secondary | ICD-10-CM | POA: Diagnosis not present

## 2023-08-06 DIAGNOSIS — J690 Pneumonitis due to inhalation of food and vomit: Secondary | ICD-10-CM | POA: Diagnosis not present

## 2023-08-06 DIAGNOSIS — I69391 Dysphagia following cerebral infarction: Secondary | ICD-10-CM | POA: Diagnosis not present

## 2023-08-06 DIAGNOSIS — J9601 Acute respiratory failure with hypoxia: Secondary | ICD-10-CM | POA: Diagnosis not present

## 2023-08-07 DIAGNOSIS — J9601 Acute respiratory failure with hypoxia: Secondary | ICD-10-CM | POA: Diagnosis not present

## 2023-08-07 DIAGNOSIS — I69391 Dysphagia following cerebral infarction: Secondary | ICD-10-CM | POA: Diagnosis not present

## 2023-08-07 DIAGNOSIS — A419 Sepsis, unspecified organism: Secondary | ICD-10-CM | POA: Diagnosis not present

## 2023-08-07 DIAGNOSIS — J690 Pneumonitis due to inhalation of food and vomit: Secondary | ICD-10-CM | POA: Diagnosis not present

## 2023-08-07 DIAGNOSIS — R1312 Dysphagia, oropharyngeal phase: Secondary | ICD-10-CM | POA: Diagnosis not present

## 2023-08-07 DIAGNOSIS — I69351 Hemiplegia and hemiparesis following cerebral infarction affecting right dominant side: Secondary | ICD-10-CM | POA: Diagnosis not present

## 2023-08-10 DIAGNOSIS — J690 Pneumonitis due to inhalation of food and vomit: Secondary | ICD-10-CM | POA: Diagnosis not present

## 2023-08-10 DIAGNOSIS — I69351 Hemiplegia and hemiparesis following cerebral infarction affecting right dominant side: Secondary | ICD-10-CM | POA: Diagnosis not present

## 2023-08-10 DIAGNOSIS — J9601 Acute respiratory failure with hypoxia: Secondary | ICD-10-CM | POA: Diagnosis not present

## 2023-08-10 DIAGNOSIS — R1312 Dysphagia, oropharyngeal phase: Secondary | ICD-10-CM | POA: Diagnosis not present

## 2023-08-10 DIAGNOSIS — A419 Sepsis, unspecified organism: Secondary | ICD-10-CM | POA: Diagnosis not present

## 2023-08-10 DIAGNOSIS — I69391 Dysphagia following cerebral infarction: Secondary | ICD-10-CM | POA: Diagnosis not present

## 2023-08-11 DIAGNOSIS — I69351 Hemiplegia and hemiparesis following cerebral infarction affecting right dominant side: Secondary | ICD-10-CM | POA: Diagnosis not present

## 2023-08-11 DIAGNOSIS — R1312 Dysphagia, oropharyngeal phase: Secondary | ICD-10-CM | POA: Diagnosis not present

## 2023-08-11 DIAGNOSIS — A419 Sepsis, unspecified organism: Secondary | ICD-10-CM | POA: Diagnosis not present

## 2023-08-11 DIAGNOSIS — I69391 Dysphagia following cerebral infarction: Secondary | ICD-10-CM | POA: Diagnosis not present

## 2023-08-11 DIAGNOSIS — J9601 Acute respiratory failure with hypoxia: Secondary | ICD-10-CM | POA: Diagnosis not present

## 2023-08-11 DIAGNOSIS — J690 Pneumonitis due to inhalation of food and vomit: Secondary | ICD-10-CM | POA: Diagnosis not present

## 2023-08-12 DIAGNOSIS — I69391 Dysphagia following cerebral infarction: Secondary | ICD-10-CM | POA: Diagnosis not present

## 2023-08-12 DIAGNOSIS — J690 Pneumonitis due to inhalation of food and vomit: Secondary | ICD-10-CM | POA: Diagnosis not present

## 2023-08-12 DIAGNOSIS — A419 Sepsis, unspecified organism: Secondary | ICD-10-CM | POA: Diagnosis not present

## 2023-08-12 DIAGNOSIS — I69351 Hemiplegia and hemiparesis following cerebral infarction affecting right dominant side: Secondary | ICD-10-CM | POA: Diagnosis not present

## 2023-08-12 DIAGNOSIS — R1312 Dysphagia, oropharyngeal phase: Secondary | ICD-10-CM | POA: Diagnosis not present

## 2023-08-12 DIAGNOSIS — J9601 Acute respiratory failure with hypoxia: Secondary | ICD-10-CM | POA: Diagnosis not present

## 2023-08-13 DIAGNOSIS — I69351 Hemiplegia and hemiparesis following cerebral infarction affecting right dominant side: Secondary | ICD-10-CM | POA: Diagnosis not present

## 2023-08-13 DIAGNOSIS — J9601 Acute respiratory failure with hypoxia: Secondary | ICD-10-CM | POA: Diagnosis not present

## 2023-08-13 DIAGNOSIS — A419 Sepsis, unspecified organism: Secondary | ICD-10-CM | POA: Diagnosis not present

## 2023-08-13 DIAGNOSIS — I69391 Dysphagia following cerebral infarction: Secondary | ICD-10-CM | POA: Diagnosis not present

## 2023-08-13 DIAGNOSIS — J690 Pneumonitis due to inhalation of food and vomit: Secondary | ICD-10-CM | POA: Diagnosis not present

## 2023-08-13 DIAGNOSIS — R1312 Dysphagia, oropharyngeal phase: Secondary | ICD-10-CM | POA: Diagnosis not present

## 2023-08-16 DIAGNOSIS — A419 Sepsis, unspecified organism: Secondary | ICD-10-CM | POA: Diagnosis not present

## 2023-08-16 DIAGNOSIS — I69391 Dysphagia following cerebral infarction: Secondary | ICD-10-CM | POA: Diagnosis not present

## 2023-08-16 DIAGNOSIS — J690 Pneumonitis due to inhalation of food and vomit: Secondary | ICD-10-CM | POA: Diagnosis not present

## 2023-08-16 DIAGNOSIS — I69351 Hemiplegia and hemiparesis following cerebral infarction affecting right dominant side: Secondary | ICD-10-CM | POA: Diagnosis not present

## 2023-08-16 DIAGNOSIS — R1312 Dysphagia, oropharyngeal phase: Secondary | ICD-10-CM | POA: Diagnosis not present

## 2023-08-16 DIAGNOSIS — J9601 Acute respiratory failure with hypoxia: Secondary | ICD-10-CM | POA: Diagnosis not present

## 2023-08-17 DIAGNOSIS — I69351 Hemiplegia and hemiparesis following cerebral infarction affecting right dominant side: Secondary | ICD-10-CM | POA: Diagnosis not present

## 2023-08-17 DIAGNOSIS — R1312 Dysphagia, oropharyngeal phase: Secondary | ICD-10-CM | POA: Diagnosis not present

## 2023-08-17 DIAGNOSIS — J690 Pneumonitis due to inhalation of food and vomit: Secondary | ICD-10-CM | POA: Diagnosis not present

## 2023-08-17 DIAGNOSIS — A419 Sepsis, unspecified organism: Secondary | ICD-10-CM | POA: Diagnosis not present

## 2023-08-17 DIAGNOSIS — J9601 Acute respiratory failure with hypoxia: Secondary | ICD-10-CM | POA: Diagnosis not present

## 2023-08-17 DIAGNOSIS — I69391 Dysphagia following cerebral infarction: Secondary | ICD-10-CM | POA: Diagnosis not present

## 2023-08-18 DIAGNOSIS — R001 Bradycardia, unspecified: Secondary | ICD-10-CM | POA: Diagnosis not present

## 2023-08-18 DIAGNOSIS — I499 Cardiac arrhythmia, unspecified: Secondary | ICD-10-CM | POA: Diagnosis not present

## 2023-08-18 DIAGNOSIS — R404 Transient alteration of awareness: Secondary | ICD-10-CM | POA: Diagnosis not present

## 2023-08-18 DIAGNOSIS — I469 Cardiac arrest, cause unspecified: Secondary | ICD-10-CM | POA: Diagnosis not present

## 2023-09-05 DEATH — deceased

## 2023-10-07 ENCOUNTER — Encounter: Payer: Self-pay | Admitting: Internal Medicine

## 2023-10-14 ENCOUNTER — Other Ambulatory Visit: Payer: TRICARE For Life (TFL)

## 2023-10-21 ENCOUNTER — Ambulatory Visit: Payer: TRICARE For Life (TFL) | Admitting: Internal Medicine
